# Patient Record
Sex: Female | Born: 1935 | Race: White | Hispanic: No | Smoking: Never smoker
Health system: Southern US, Community
[De-identification: ages and names within clinical notes are randomized; demographics above are authoritative.]

## PROBLEM LIST (undated history)

## (undated) DIAGNOSIS — F419 Anxiety disorder, unspecified: Secondary | ICD-10-CM

## (undated) DIAGNOSIS — R7989 Other specified abnormal findings of blood chemistry: Secondary | ICD-10-CM

## (undated) DIAGNOSIS — G5710 Meralgia paresthetica, unspecified lower limb: Secondary | ICD-10-CM

## (undated) DIAGNOSIS — I10 Essential (primary) hypertension: Secondary | ICD-10-CM

## (undated) DIAGNOSIS — M8448XA Pathological fracture, other site, initial encounter for fracture: Secondary | ICD-10-CM

## (undated) DIAGNOSIS — M542 Cervicalgia: Secondary | ICD-10-CM

## (undated) DIAGNOSIS — I2510 Atherosclerotic heart disease of native coronary artery without angina pectoris: Secondary | ICD-10-CM

## (undated) DIAGNOSIS — R252 Cramp and spasm: Secondary | ICD-10-CM

## (undated) DIAGNOSIS — I4891 Unspecified atrial fibrillation: Secondary | ICD-10-CM

## (undated) DIAGNOSIS — Z7901 Long term (current) use of anticoagulants: Secondary | ICD-10-CM

## (undated) DIAGNOSIS — M79609 Pain in unspecified limb: Secondary | ICD-10-CM

## (undated) DIAGNOSIS — I498 Other specified cardiac arrhythmias: Secondary | ICD-10-CM

## (undated) DIAGNOSIS — L570 Actinic keratosis: Secondary | ICD-10-CM

## (undated) DIAGNOSIS — M5137 Other intervertebral disc degeneration, lumbosacral region: Secondary | ICD-10-CM

## (undated) DIAGNOSIS — M6740 Ganglion, unspecified site: Secondary | ICD-10-CM

## (undated) DIAGNOSIS — M25529 Pain in unspecified elbow: Secondary | ICD-10-CM

## (undated) DIAGNOSIS — M810 Age-related osteoporosis without current pathological fracture: Secondary | ICD-10-CM

## (undated) DIAGNOSIS — K644 Residual hemorrhoidal skin tags: Secondary | ICD-10-CM

## (undated) DIAGNOSIS — H119 Unspecified disorder of conjunctiva: Secondary | ICD-10-CM

## (undated) DIAGNOSIS — M545 Low back pain: Secondary | ICD-10-CM

## (undated) DIAGNOSIS — M25579 Pain in unspecified ankle and joints of unspecified foot: Secondary | ICD-10-CM

## (undated) DIAGNOSIS — M629 Disorder of muscle, unspecified: Secondary | ICD-10-CM

## (undated) DIAGNOSIS — S32009A Unspecified fracture of unspecified lumbar vertebra, initial encounter for closed fracture: Secondary | ICD-10-CM

## (undated) DIAGNOSIS — G510 Bell's palsy: Secondary | ICD-10-CM

## (undated) DIAGNOSIS — R21 Rash and other nonspecific skin eruption: Secondary | ICD-10-CM

## (undated) DIAGNOSIS — I493 Ventricular premature depolarization: Secondary | ICD-10-CM

## (undated) DIAGNOSIS — M546 Pain in thoracic spine: Secondary | ICD-10-CM

## (undated) DIAGNOSIS — IMO0001 Reserved for inherently not codable concepts without codable children: Secondary | ICD-10-CM

## (undated) DIAGNOSIS — I8393 Asymptomatic varicose veins of bilateral lower extremities: Secondary | ICD-10-CM

## (undated) DIAGNOSIS — R209 Unspecified disturbances of skin sensation: Secondary | ICD-10-CM

## (undated) DIAGNOSIS — I639 Cerebral infarction, unspecified: Secondary | ICD-10-CM

## (undated) DIAGNOSIS — C4491 Basal cell carcinoma of skin, unspecified: Secondary | ICD-10-CM

## (undated) DIAGNOSIS — I252 Old myocardial infarction: Secondary | ICD-10-CM

## (undated) DIAGNOSIS — R42 Dizziness and giddiness: Secondary | ICD-10-CM

## (undated) DIAGNOSIS — M25519 Pain in unspecified shoulder: Secondary | ICD-10-CM

## (undated) HISTORY — DX: Unspecified fracture of unspecified lumbar vertebra, initial encounter for closed fracture: S32.009A

## (undated) HISTORY — DX: Cramp and spasm: R25.2

## (undated) HISTORY — DX: Disorder of muscle, unspecified: M62.9

## (undated) HISTORY — DX: Unspecified disorder of conjunctiva: H11.9

## (undated) HISTORY — DX: Atherosclerotic heart disease of native coronary artery without angina pectoris: I25.10

## (undated) HISTORY — DX: Unspecified disturbances of skin sensation: R20.9

## (undated) HISTORY — DX: Dizziness and giddiness: R42

## (undated) HISTORY — DX: Pain in unspecified shoulder: M25.519

## (undated) HISTORY — DX: Essential (primary) hypertension: I10

## (undated) HISTORY — DX: Unspecified atrial fibrillation: I48.91

## (undated) HISTORY — DX: Other specified abnormal findings of blood chemistry: R79.89

## (undated) HISTORY — DX: Pathological fracture, other site, initial encounter for fracture: M84.48XA

## (undated) HISTORY — DX: Pain in unspecified elbow: M25.529

## (undated) HISTORY — DX: Asymptomatic varicose veins of bilateral lower extremities: I83.93

## (undated) HISTORY — DX: Pain in thoracic spine: M54.6

## (undated) HISTORY — DX: Low back pain: M54.5

## (undated) HISTORY — DX: Cerebral infarction, unspecified: I63.9

## (undated) HISTORY — DX: Pain in unspecified ankle and joints of unspecified foot: M25.579

## (undated) HISTORY — DX: Basal cell carcinoma of skin, unspecified: C44.91

## (undated) HISTORY — DX: Cervicalgia: M54.2

## (undated) HISTORY — DX: Other specified cardiac arrhythmias: I49.8

## (undated) HISTORY — DX: Pain in unspecified limb: M79.609

## (undated) HISTORY — DX: Residual hemorrhoidal skin tags: K64.4

## (undated) HISTORY — DX: Age-related osteoporosis without current pathological fracture: M81.0

## (undated) HISTORY — DX: Old myocardial infarction: I25.2

## (undated) HISTORY — DX: Rash and other nonspecific skin eruption: R21

## (undated) HISTORY — DX: Other intervertebral disc degeneration, lumbosacral region: M51.37

## (undated) HISTORY — DX: Actinic keratosis: L57.0

## (undated) HISTORY — DX: Ganglion, unspecified site: M67.40

## (undated) HISTORY — DX: Reserved for inherently not codable concepts without codable children: IMO0001

## (undated) HISTORY — DX: Meralgia paresthetica, unspecified lower limb: G57.10

## (undated) HISTORY — DX: Long term (current) use of anticoagulants: Z79.01

## (undated) HISTORY — DX: Bell's palsy: G51.0

## (undated) HISTORY — DX: Ventricular premature depolarization: I49.3

---
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
## 1979-07-18 DIAGNOSIS — G510 Bell's palsy: Secondary | ICD-10-CM

## 1979-07-18 HISTORY — DX: Bell's palsy: G51.0

## 1993-07-17 DIAGNOSIS — M810 Age-related osteoporosis without current pathological fracture: Secondary | ICD-10-CM

## 1993-07-17 HISTORY — DX: Age-related osteoporosis without current pathological fracture: M81.0

## 1996-11-06 HISTORY — PX: CORONARY ARTERY BYPASS GRAFT: SHX141

## 1997-02-05 ENCOUNTER — Encounter: Payer: Self-pay | Admitting: Internal Medicine

## 1997-07-17 DIAGNOSIS — I10 Essential (primary) hypertension: Secondary | ICD-10-CM

## 1997-07-17 DIAGNOSIS — I2510 Atherosclerotic heart disease of native coronary artery without angina pectoris: Secondary | ICD-10-CM

## 1997-07-17 HISTORY — DX: Atherosclerotic heart disease of native coronary artery without angina pectoris: I25.10

## 1997-07-17 HISTORY — DX: Essential (primary) hypertension: I10

## 2005-07-17 DIAGNOSIS — S32009A Unspecified fracture of unspecified lumbar vertebra, initial encounter for closed fracture: Secondary | ICD-10-CM

## 2005-07-17 HISTORY — DX: Unspecified fracture of unspecified lumbar vertebra, initial encounter for closed fracture: S32.009A

## 2006-11-21 ENCOUNTER — Encounter: Payer: Self-pay | Admitting: Internal Medicine

## 2007-07-08 DIAGNOSIS — G5710 Meralgia paresthetica, unspecified lower limb: Secondary | ICD-10-CM

## 2007-07-08 HISTORY — DX: Meralgia paresthetica, unspecified lower limb: G57.10

## 2009-02-15 ENCOUNTER — Encounter: Payer: Self-pay | Admitting: Internal Medicine

## 2009-05-13 DIAGNOSIS — M810 Age-related osteoporosis without current pathological fracture: Secondary | ICD-10-CM | POA: Insufficient documentation

## 2009-07-07 DIAGNOSIS — M545 Low back pain, unspecified: Secondary | ICD-10-CM

## 2009-07-07 HISTORY — DX: Low back pain, unspecified: M54.50

## 2009-07-14 ENCOUNTER — Encounter: Payer: Self-pay | Admitting: Internal Medicine

## 2009-08-06 DIAGNOSIS — R209 Unspecified disturbances of skin sensation: Secondary | ICD-10-CM

## 2009-08-06 HISTORY — DX: Unspecified disturbances of skin sensation: R20.9

## 2009-08-16 DIAGNOSIS — R252 Cramp and spasm: Secondary | ICD-10-CM

## 2009-08-16 DIAGNOSIS — M6740 Ganglion, unspecified site: Secondary | ICD-10-CM

## 2009-08-16 DIAGNOSIS — I8393 Asymptomatic varicose veins of bilateral lower extremities: Secondary | ICD-10-CM

## 2009-08-16 HISTORY — DX: Ganglion, unspecified site: M67.40

## 2009-08-16 HISTORY — DX: Asymptomatic varicose veins of bilateral lower extremities: I83.93

## 2009-08-16 HISTORY — DX: Cramp and spasm: R25.2

## 2009-12-20 ENCOUNTER — Encounter: Payer: Self-pay | Admitting: Internal Medicine

## 2010-03-16 ENCOUNTER — Ambulatory Visit: Payer: Self-pay | Admitting: Internal Medicine

## 2010-03-16 DIAGNOSIS — I4949 Other premature depolarization: Secondary | ICD-10-CM

## 2010-03-16 DIAGNOSIS — I252 Old myocardial infarction: Secondary | ICD-10-CM

## 2010-03-17 DIAGNOSIS — E785 Hyperlipidemia, unspecified: Secondary | ICD-10-CM

## 2010-06-27 DIAGNOSIS — I498 Other specified cardiac arrhythmias: Secondary | ICD-10-CM

## 2010-06-27 DIAGNOSIS — K644 Residual hemorrhoidal skin tags: Secondary | ICD-10-CM

## 2010-06-27 HISTORY — DX: Other specified cardiac arrhythmias: I49.8

## 2010-06-27 HISTORY — DX: Residual hemorrhoidal skin tags: K64.4

## 2010-09-06 DIAGNOSIS — Z7901 Long term (current) use of anticoagulants: Secondary | ICD-10-CM

## 2010-09-06 HISTORY — DX: Long term (current) use of anticoagulants: Z79.01

## 2010-09-07 ENCOUNTER — Ambulatory Visit: Payer: Self-pay | Admitting: Internal Medicine

## 2010-09-07 DIAGNOSIS — I4891 Unspecified atrial fibrillation: Secondary | ICD-10-CM | POA: Insufficient documentation

## 2010-09-12 ENCOUNTER — Ambulatory Visit: Payer: Self-pay | Admitting: Internal Medicine

## 2010-09-12 ENCOUNTER — Ambulatory Visit: Payer: Self-pay | Admitting: Cardiovascular Disease

## 2010-09-12 DIAGNOSIS — I2510 Atherosclerotic heart disease of native coronary artery without angina pectoris: Secondary | ICD-10-CM

## 2010-09-12 LAB — CONVERTED CEMR LAB: POC INR: 1.7

## 2010-09-19 LAB — CONVERTED CEMR LAB
HDL: 60.5 mg/dL (ref >39.00)
Triglycerides: 40 mg/dL (ref 0.0–149.0)
VLDL: 8 mg/dL (ref 0.0–40.0)

## 2010-09-21 ENCOUNTER — Telehealth (INDEPENDENT_AMBULATORY_CARE_PROVIDER_SITE_OTHER): Payer: Self-pay | Admitting: *Deleted

## 2010-09-21 DIAGNOSIS — I4891 Unspecified atrial fibrillation: Secondary | ICD-10-CM

## 2010-09-21 HISTORY — DX: Unspecified atrial fibrillation: I48.91

## 2010-09-22 ENCOUNTER — Encounter: Payer: Self-pay | Admitting: Cardiovascular Disease

## 2010-12-06 NOTE — Letter (Signed)
Summary: Immunologist Care Office Note  [facility] Office Note   Imported By: Roderic Ovens 03/24/2010 16:02:53  _____________________________________________________________________  External Attachment:    Type:   Image     Comment:   External Document

## 2010-12-06 NOTE — Medication Information (Signed)
Summary: COUMADIN  STARTED 3MG  DAILY 09-07-10/SL  Anticoagulant Therapy  Managed by: Weston Brass, PharmD Referring MD: Ladona Ridgel PCP: Eulah Citizen Supervising MD: Nahser Indication 1: Atrial Fibrillation Lab Used: [facility] [place] Site: [address] INR POC 1.7 INR RANGE 2.0-3.0  Dietary changes: no    Health status changes: no    Bleeding/hemorrhagic complications: no    Recent/future hospitalizations: no    Any changes in medication regimen? yes       Details: started Coumadin and digoxin last Wednesday after new diagnosis of Afib  Recent/future dental: no  Any missed doses?: no       Is patient compliant with meds? yes      Comments: Pt educated on bleeding risks, dietary concerns, and medication interactions.  Her husband has been on Coumadin in the past so both are very familiar with the medication.  Pt lives at [facility]. Will ask their clinic if they will monitor INR and adjust Coumadin.   Allergies: 1)  ! Relafen 2)  ! Bactrim  Anticoagulation Management History:      The patient comes in today for her initial visit for anticoagulation therapy.  Positive risk factors for bleeding include an age of 6 years or older.  The bleeding index is 'intermediate risk'.  Positive CHADS2 values include Age > 75 years old.  Anticoagulation responsible Paymon [employer]steel: Nahser.  INR POC: 1.7.  Cuvette Lot#: [id number].  Exp: 09/2011.    Anticoagulation Management Assessment/Plan:      The patient's current anticoagulation dose is Coumadin 3 mg tabs: take as directed.  The target INR is 2.0-3.0.  The next INR is due 09/19/2010.  Anticoagulation instructions were given to patient/husband.  Results were reviewed/authorized by Weston Brass, PharmD.  She was notified by Weston Brass PharmD.         Current Anticoagulation Instructions: INR 1.7  Continue same dose of 1 tablet every day.  Recheck INR in 1 week.

## 2010-12-06 NOTE — Assessment & Plan Note (Signed)
Summary: 6 MO F/U   Visit Type:  Follow-up Primary Provider:  Eulah Citizen   History of Present Illness: [name] returns today for additional followup.  She has a h/o CAD and an anteroseptal MI (silent) in the past.  She is s/p CABG.  Despite this she has preserved LV function and class 1 symptoms.  She exercises with her husband almost every day and denies c/p or sob.  She has rare palpitations with documented PVC's in the past.  No syncope or peripheral edema. She notes no change in her exercise tolerance.  Current Medications (verified): 1)  Atenolol 25 Mg Tabs (Atenolol) .... Take 1/2 Tablet By Mouth Daily 2)  Vytorin 10-40 Mg Tabs (Ezetimibe-Simvastatin) .... Take One Tablet By Mouth Dailyat Bedtime 3)  Aspirin 81 Mg Tbec (Aspirin) .... Take One Tablet By Mouth Daily 4)  Nitrostat 0.4 Mg Subl (Nitroglycerin) .Marland Kitchen.. 1 Tablet Under Tongue At Onset of Chest Pain; You May Repeat Every 5 Minutes For Up To 3 Doses. 5)  Calcium  Chew (Calcium Carbonate-Vit D-Min) .... Once Daily 6)  Lyrica 25 Mg Caps (Pregabalin) .... As Needed 7)  Ativan 1 Mg Tabs (Lorazepam) .... As Needed 8)  Hydrocodone-Acetaminophen 7.5-750 Mg Tabs (Hydrocodone-Acetaminophen) .... As Needed  Allergies: 1)  ! Relafen 2)  ! Bactrim  Past History:  Past Medical History: Last updated: 03/16/2010 Current Problems:  PREMATURE VENTRICULAR CONTRACTIONS (ICD-427.69) OLD MYOCARDIAL INFARCTION (ICD-412)  Past Surgical History: Last updated: 03/16/2010 bypass graft times two LIMA to LAD SVG to diagonal off bypass  Review of Systems  The patient denies chest pain, syncope, dyspnea on exertion, and peripheral edema.    Vital Signs:  Patient profile:   77 year old female Height:      66 inches Weight:      147 pounds BMI:     23.81 Pulse rate:   97 / minute BP sitting:   134 / 90  (left arm)  Vitals Entered By: Laurance Flatten CMA (September 07, 2010 2:27 PM)  Physical Exam  General:  Elderlt, well developed,  well nourished, in no acute distress.  HEENT: normal Neck: supple. No JVD. Carotids 2+ bilaterally no bruits Cor: IRIR tachycardia with no rubs, gallops or murmur Lungs: CTA with no wheezes or rhonchi Ab: soft, nontender. nondistended. No HSM. Good bowel sounds Ext: warm. no cyanosis, clubbing or edema Neuro: alert and oriented. Grossly nonfocal. affect pleasant    EKG  Procedure date:  09/07/2010  Findings:      Atrial fibrillation with an uncontrolled ventricular response rate of: 97.  Impression & Recommendations:  Problem # 1:  ATRIAL FIBRILLATION (ICD-427.31) this is a new problem.  I have discussed the treatment options with the patient and recommend we start coumadin and additional digoxin to help with rate control. Her updated medication list for this problem includes:    Atenolol 25 Mg Tabs (Atenolol) .Marland Kitchen... Take 1/2 tablet by mouth daily    Aspirin 81 Mg Tbec (Aspirin) .Marland Kitchen... Take one tablet by mouth daily    Coumadin 3 Mg Tabs (Warfarin sodium) .Marland Kitchen... Take as directed    Digoxin 0.125 Mg Tabs (Digoxin) ..... One by mouth daily  Orders: [address] Referral (Coumadin clinic)  Problem # 2:  OLD MYOCARDIAL INFARCTION (ICD-412) She denies any anginal symptoms.  Continue meds as below. Her updated medication list for this problem includes:    Atenolol 25 Mg Tabs (Atenolol) .Marland Kitchen... Take 1/2 tablet by mouth daily    Aspirin  81 Mg Tbec (Aspirin) .Marland Kitchen... Take one tablet by mouth daily    Nitrostat 0.4 Mg Subl (Nitroglycerin) .Marland Kitchen... 1 tablet under tongue at onset of chest pain; you may repeat every 5 minutes for up to 3 doses.    Coumadin 3 Mg Tabs (Warfarin sodium) .Marland Kitchen... Take as directed  Problem # 3:  DYSLIPIDEMIA (ICD-272.4) We discussed the possibility of switching to simvastatin but because she has been well controlled, I think it is reasonable to continue her current meds. Her updated medication list for this problem includes:    Vytorin 10-40 Mg Tabs  (Ezetimibe-simvastatin) .Marland Kitchen... Take one tablet by mouth dailyat bedtime  Patient Instructions: 1)  Your physician recommends that you schedule a follow-up appointment in: 3 months with Dr Ladona Ridgel 2)  Your physician has recommended you make the following change in your medication: start Coumadin 3mg  daily and Digoxin 0.125mg  daily 3)  Your physician recommends that you return for lab work on Mon fastin lipid panel and TSH  427.31  414.01 4)  You have been referred to [facility]  Prescriptions: DIGOXIN 0.125 MG TABS (DIGOXIN) one by mouth daily  #30 x 6   Entered by:   Dennis Bast, RN, BSN   Authorized by:   Laren Boom, MD, [facility]   Signed by:   Dennis Bast, RN, BSN on 09/07/2010   Method used:   Electronically to        [facility]  [phone]* (retail)       [address] [place], [place]  [address]       Ph: [phone] or [phone]       Fax: [phone]   RxID:   [id number] COUMADIN 3 MG TABS (WARFARIN SODIUM) take as directed  #45 x 3   Entered by:   Dennis Bast, RN, BSN   Authorized by:   Laren Boom, MD, [facility]   Signed by:   Dennis Bast, RN, BSN on 09/07/2010   Method used:   Electronically to        [facility]  [phone]* (retail)       [address] [place], [place]  [address]       Ph: [phone] or [phone]       Fax: [phone]   RxID:   [id number]

## 2010-12-06 NOTE — Medication Information (Signed)
Summary: Coumadin Clinic  Anticoagulant Therapy  Managed by: Inactive Referring MD: Ladona Ridgel PCP: Eulah Citizen Supervising MD: Clifton James MD, Cristal Deer Indication 1: Atrial Fibrillation Lab Used: [facility] [place] Site: [address] INR RANGE 2.0-3.0          Comments: Per nurse at [facility] pt will now have her INR dosed by Nils Flack, NP  Allergies: 1)  ! Relafen 2)  ! Bactrim  Anticoagulation Management History:      Positive risk factors for bleeding include an age of 30 years or older.  The bleeding index is 'intermediate risk'.  Positive CHADS2 values include Age > 92 years old.  Anticoagulation responsible provider: Clifton James MD, Cristal Deer.  Exp: 09/2011.    Anticoagulation Management Assessment/Plan:      The patient's current anticoagulation dose is Coumadin 3 mg tabs: take as directed.  The target INR is 2.0-3.0.  The next INR is due 09/19/2010.  Anticoagulation instructions were given to patient/husband.  Results were reviewed/authorized by Inactive.         Prior Anticoagulation Instructions: INR 1.7  Continue same dose of 1 tablet every day.  Recheck INR in 1 week.

## 2010-12-06 NOTE — Letter (Signed)
Summary: [facility] Progress Note  [facility] Progress Note   Imported By: Roderic Ovens 03/24/2010 16:01:26  _____________________________________________________________________  External Attachment:    Type:   Image     Comment:   External Document

## 2010-12-06 NOTE — Progress Notes (Signed)
Summary: Records Request  Faxed OV & EKG to [facility] ([phone]). Debby Freiberg  September 21, 2010 6:05 PM

## 2010-12-06 NOTE — Assessment & Plan Note (Signed)
Summary: NEW PT EVAL PT HAS HEART DISEASE OK PER Larrissa Stivers   Visit Type:  Initial Consult Primary Provider:  Eulah Conway   History of Present Illness: Susan Conway is referred today by Dr. Chilton Si to establish ongoing cardiology followup.  She and her husband (also a patient of mine) have moved to [place] to be closer to family.  She has a h/o CAD and an anteroseptal MI (silent) in the past.  She is s/p CABG.  Despite this she has preserved LV function and class 1 symptoms.  She exercises with her husband almost every day and denies c/p or sob.  She has rare palpitations with documented PVC's in the past.  No syncope or peripheral edema.  Current Medications (verified): 1)  Alendronate Sodium 70 Mg Tabs (Alendronate Sodium) .Marland Kitchen.. 1 Tab Weekly 2)  Atenolol 25 Mg Tabs (Atenolol) .... Take 1/2 Tablet By Mouth Daily 3)  Vytorin 10-40 Mg Tabs (Ezetimibe-Simvastatin) .... Take One Tablet By Mouth Dailyat Bedtime 4)  Aspirin 81 Mg Tbec (Aspirin) .... Take One Tablet By Mouth Daily 5)  Nitrostat 0.4 Mg Subl (Nitroglycerin) .Marland Kitchen.. 1 Tablet Under Tongue At Onset of Chest Pain; You May Repeat Every 5 Minutes For Up To 3 Doses. 6)  Calcium 1200-1000 Mg-Unit Chew (Calcium Carbonate-Vit D-Min) .... Two Times A Day 7)  Lyrica 25 Mg Caps (Pregabalin) .... As Needed 8)  Ativan 1 Mg Tabs (Lorazepam) .... As Needed 9)  Hydrocodone-Acetaminophen 7.5-750 Mg Tabs (Hydrocodone-Acetaminophen) .... As Needed  Allergies (verified): 1)  ! Relafen 2)  ! Bactrim  Past History:  Past Medical History: Last updated: 03/16/2010 Current Problems:  PREMATURE VENTRICULAR CONTRACTIONS (ICD-427.69) OLD MYOCARDIAL INFARCTION (ICD-412)  Past Surgical History: Last updated: 03/16/2010 bypass graft times two LIMA to LAD SVG to diagonal off bypass  Family History: Non-contributory   Social History: Married, retired and living at [facility]  Review of Systems       All systems reviewed and negative except as noted in  the HPI.  Vital Signs:  Patient profile:   77 year old female Height:      66 inches Weight:      142 pounds BMI:     23.00 Pulse rate:   57 / minute BP sitting:   132 / 82  (left arm)  Vitals Entered By: Laurance Flatten CMA (Mar 16, 2010 2:24 PM)  Physical Exam  General:  Elderlt, well developed, well nourished, in no acute distress.  HEENT: normal Neck: supple. No JVD. Carotids 2+ bilaterally no bruits Cor: Regular brady with no rubs, gallops or murmur Lungs: CTA with no wheezes or rhonchi Ab: soft, nontender. nondistended. No HSM. Good bowel sounds Ext: warm. no cyanosis, clubbing or edema Neuro: alert and oriented. Grossly nonfocal. affect pleasant    EKG  Procedure date:  03/16/2010  Findings:      Sinus bradycardia with rate of: 57.   Comments:      Evidence of remote septal MI.    Impression & Recommendations:  Problem # 1:  OLD MYOCARDIAL INFARCTION (ICD-412) She denies anginal symptoms and is currently asmptomatic.  She will continue her current meds. Her updated medication list for this problem includes:    Atenolol 25 Mg Tabs (Atenolol) .Marland Kitchen... Take 1/2 tablet by mouth daily    Aspirin 81 Mg Tbec (Aspirin) .Marland Kitchen... Take one tablet by mouth daily    Nitrostat 0.4 Mg Subl (Nitroglycerin) .Marland Kitchen... 1 tablet under tongue at onset of chest pain; you may repeat every 5 minutes for  up to 3 doses.  Problem # 2:  PREMATURE VENTRICULAR CONTRACTIONS (ICD-427.69) These are minimally symptomatic.  Will followup. Her updated medication list for this problem includes:    Atenolol 25 Mg Tabs (Atenolol) .Marland Kitchen... Take 1/2 tablet by mouth daily    Aspirin 81 Mg Tbec (Aspirin) .Marland Kitchen... Take one tablet by mouth daily    Nitrostat 0.4 Mg Subl (Nitroglycerin) .Marland Kitchen... 1 tablet under tongue at onset of chest pain; you may repeat every 5 minutes for up to 3 doses.  Problem # 3:  DYSLIPIDEMIA (ICD-272.4) Continue Vytorin and maintain a low fat diet. Her updated medication list for this problem  includes:    Vytorin 10-40 Mg Tabs (Ezetimibe-simvastatin) .Marland Kitchen... Take one tablet by mouth dailyat bedtime  Patient Instructions: 1)  Your physician recommends that you schedule a follow-up appointment in: 6 months with Dr Ladona Ridgel

## 2010-12-06 NOTE — Letter (Signed)
Summary: [facility]Do[facility]   [facility]Do[facility]   Imported By: Roderic Ovens 08/09/2010 12:04:10  _____________________________________________________________________  External Attachment:    Type:   Image     Comment:   External Document

## 2010-12-13 ENCOUNTER — Encounter: Payer: Self-pay | Admitting: Internal Medicine

## 2010-12-13 ENCOUNTER — Ambulatory Visit (INDEPENDENT_AMBULATORY_CARE_PROVIDER_SITE_OTHER): Payer: Medicare Other | Admitting: Internal Medicine

## 2010-12-13 DIAGNOSIS — I4891 Unspecified atrial fibrillation: Secondary | ICD-10-CM

## 2010-12-13 DIAGNOSIS — I2589 Other forms of chronic ischemic heart disease: Secondary | ICD-10-CM

## 2010-12-15 ENCOUNTER — Telehealth: Payer: Self-pay | Admitting: Internal Medicine

## 2010-12-22 NOTE — Progress Notes (Signed)
Summary: callig regarding medications  Phone Note Call from Patient Call back at Home Phone [phone]   Caller: Patient Summary of Call: Pt calling regarding medications Initial call taken by: Judie Grieve,  December 15, 2010 10:51 AM  Follow-up for Phone Call        I sent in for her Digoxin and not her Warfarin.  She will have to get the Warfarin filled from her primary who follows her INR's Dennis Bast, RN, BSN  December 15, 2010 2:54 PM

## 2010-12-22 NOTE — Assessment & Plan Note (Signed)
Summary: 3 MONTH ROV.SL=MJ   Visit Type:  Follow-up Primary Provider:  Eulah Citizen   History of Present Illness: Susan Conway returns today for additional followup.  She has a h/o CAD and an anteroseptal MI (silent) in the past.  She is s/p CABG.  Despite this she has preserved LV function and class 1 symptoms.  She exercises with her husband almost every day and denies c/p or sob.  She has rare palpitations with documented PVC's in the past.  No syncope or peripheral edema. She notes no change in her exercise tolerance. When I last saw her several months ago she had developed atrial fibrillation and was placed on coumadin and digoxin. She notes minimal palpitations since then.  Current Medications (verified): 1)  Atenolol 25 Mg Tabs (Atenolol) .... Take 1/2 Tablet By Mouth Daily 2)  Vytorin 10-40 Mg Tabs (Ezetimibe-Simvastatin) .... Take One Tablet By Mouth Dailyat Bedtime 3)  Aspirin 81 Mg Tbec (Aspirin) .... Take One Tablet By Mouth Daily 4)  Calcium  Chew (Calcium Carbonate-Vit D-Min) .... Once Daily 5)  Lyrica 25 Mg Caps (Pregabalin) .... As Needed 6)  Ativan 1 Mg Tabs (Lorazepam) .... As Needed 7)  Hydrocodone-Acetaminophen 7.5-750 Mg Tabs (Hydrocodone-Acetaminophen) .... As Needed 8)  Coumadin 3 Mg Tabs (Warfarin Sodium) .... Take As Directed 9)  Digoxin 0.125 Mg Tabs (Digoxin) .... One By Mouth Daily  Allergies: 1)  ! Relafen 2)  ! Bactrim  Past History:  Past Medical History: Last updated: 03/16/2010 Current Problems:  PREMATURE VENTRICULAR CONTRACTIONS (ICD-427.69) OLD MYOCARDIAL INFARCTION (ICD-412)  Past Surgical History: Last updated: 03/16/2010 bypass graft times two LIMA to LAD SVG to diagonal off bypass  Review of Systems  The patient denies chest pain, syncope, dyspnea on exertion, and peripheral edema.    Vital Signs:  Patient profile:   77 year old female Height:      66 inches Weight:      144 pounds Pulse rate:   51 / minute BP sitting:   118 / 60   (left arm)  Vitals Entered By: Laurance Flatten CMA (December 13, 2010 2:51 PM)  Physical Exam  General:  Elderlt, well developed, well nourished, in no acute distress.  HEENT: normal Neck: supple. No JVD. Carotids 2+ bilaterally no bruits Cor: IRIR tachycardia with no rubs, gallops or murmur Lungs: CTA with no wheezes or rhonchi Ab: soft, nontender. nondistended. No HSM. Good bowel sounds Ext: warm. no cyanosis, clubbing or edema Neuro: alert and oriented. Grossly nonfocal. affect pleasant    EKG  Procedure date:  12/13/2010  Findings:      Sinus bradycardia with rate of:  54.  Impression & Recommendations:  Problem # 1:  ATRIAL FIBRILLATION (ICD-427.31) Her symptoms appear to be well controlled on meds below. It is unclear how much she has been in atrial fib as she is minimally symptomatic. Today she is back in NSR. She will continue her meds as below. Her updated medication list for this problem includes:    Atenolol 25 Mg Tabs (Atenolol) .Marland Kitchen... Take 1/2 tablet by mouth daily    Aspirin 81 Mg Tbec (Aspirin) .Marland Kitchen... Take one tablet by mouth daily    Coumadin 3 Mg Tabs (Warfarin sodium) .Marland Kitchen... Take as directed    Digoxin 0.125 Mg Tabs (Digoxin) ..... One by mouth daily  Problem # 2:  CORONARY ATHEROSCLEROSIS NATIVE CORONARY ARTERY (ICD-414.01) She denies anginal symptoms. Continue meds as below. She remains very active. The following medications were removed from the medication  list:    Nitrostat 0.4 Mg Subl (Nitroglycerin) .Marland Kitchen... 1 tablet under tongue at onset of chest pain; you may repeat every 5 minutes for up to 3 doses. Her updated medication list for this problem includes:    Atenolol 25 Mg Tabs (Atenolol) .Marland Kitchen... Take 1/2 tablet by mouth daily    Aspirin 81 Mg Tbec (Aspirin) .Marland Kitchen... Take one tablet by mouth daily    Coumadin 3 Mg Tabs (Warfarin sodium) .Marland Kitchen... Take as directed  Patient Instructions: 1)  Your physician wants you to follow-up in: 6 months with Dr Court Joy will  receive a reminder letter in the mail two months in advance. If you don't receive a letter, please call our office to schedule the follow-up appointment. 2)  Your physician recommends that you continue on your current medications as directed. Please refer to the Current Medication list given to you today. Prescriptions: DIGOXIN 0.125 MG TABS (DIGOXIN) one by mouth daily  #90 x 3   Entered by:   Dennis Bast, RN, BSN   Authorized by:   Laren Boom, MD, [facility]   Signed by:   Dennis Bast, RN, BSN on 12/13/2010   Method used:   Faxed to ...       [facility])       [address] [address]       [place], [place]  [address]       Ph: [phone]       Fax: [phone]   RxID:   [id number]

## 2010-12-26 ENCOUNTER — Encounter: Payer: Self-pay | Admitting: Internal Medicine

## 2010-12-26 DIAGNOSIS — H119 Unspecified disorder of conjunctiva: Secondary | ICD-10-CM

## 2010-12-26 DIAGNOSIS — M25529 Pain in unspecified elbow: Secondary | ICD-10-CM

## 2010-12-26 HISTORY — DX: Unspecified disorder of conjunctiva: H11.9

## 2010-12-26 HISTORY — DX: Pain in unspecified elbow: M25.529

## 2011-01-11 DIAGNOSIS — M79609 Pain in unspecified limb: Secondary | ICD-10-CM

## 2011-01-11 HISTORY — DX: Pain in unspecified limb: M79.609

## 2011-01-17 NOTE — Letter (Signed)
Summary: [facility]   Imported By: Marylou Mccoy 01/13/2011 11:48:02  _____________________________________________________________________  External Attachment:    Type:   Image     Comment:   External Document

## 2011-02-16 ENCOUNTER — Other Ambulatory Visit: Payer: Self-pay | Admitting: Internal Medicine

## 2011-02-16 DIAGNOSIS — Z1231 Encounter for screening mammogram for malignant neoplasm of breast: Secondary | ICD-10-CM

## 2011-03-08 ENCOUNTER — Ambulatory Visit
Admission: RE | Admit: 2011-03-08 | Discharge: 2011-03-08 | Disposition: A | Discharge status: Home / Self Care | Payer: Medicare Other | Source: Ambulatory Visit | Attending: Internal Medicine | Admitting: Internal Medicine

## 2011-03-08 DIAGNOSIS — Z1231 Encounter for screening mammogram for malignant neoplasm of breast: Secondary | ICD-10-CM

## 2011-04-27 ENCOUNTER — Encounter: Payer: Self-pay | Admitting: Internal Medicine

## 2011-06-06 ENCOUNTER — Encounter: Payer: Self-pay | Admitting: Internal Medicine

## 2011-06-15 ENCOUNTER — Ambulatory Visit (INDEPENDENT_AMBULATORY_CARE_PROVIDER_SITE_OTHER): Payer: Medicare Other | Admitting: Internal Medicine

## 2011-06-15 ENCOUNTER — Encounter: Payer: Self-pay | Admitting: Internal Medicine

## 2011-06-15 VITALS — BP 122/84 | HR 82 | Ht 66.5 in | Wt 143.4 lb

## 2011-06-15 DIAGNOSIS — E785 Hyperlipidemia, unspecified: Secondary | ICD-10-CM

## 2011-06-15 DIAGNOSIS — I4891 Unspecified atrial fibrillation: Secondary | ICD-10-CM

## 2011-06-15 DIAGNOSIS — I2510 Atherosclerotic heart disease of native coronary artery without angina pectoris: Secondary | ICD-10-CM

## 2011-06-15 NOTE — Progress Notes (Signed)
HPI [name] today for followup. She is a very pleasant 77 year old woman with a history of atrial ablation, hypertension, chronic Coumadin therapy, and dyslipidemia. She also has coronary artery disease. She denies chest pain, shortness of breath, or syncope. She has occasional palpitations and rare dizziness but no headache, fever, or chills. Allergies  Allergen Reactions  . Bactrim   . Iodinated Diagnostic Agents   . Nabumetone   . Sulfamethoxazole W/Trimethoprim      Current Outpatient Prescriptions  Medication Sig Dispense Refill  . aspirin 81 MG tablet Take 81 mg by mouth daily.        Marland Kitchen atenolol (TENORMIN) 25 MG tablet Take 12.5 mg by mouth daily.        . Calcium-Vitamin D-Vitamin K (CALCIUM SOFT CHEWS PO) Take by mouth daily.        . digoxin (LANOXIN) 0.125 MG tablet Take 125 mcg by mouth daily.        Marland Kitchen ezetimibe-simvastatin (VYTORIN) 10-40 MG per tablet Take 1 tablet by mouth at bedtime.        Marland Kitchen HYDROcodone-acetaminophen (VICODIN ES) 7.5-750 MG per tablet as needed.        Marland Kitchen LORazepam (ATIVAN) 1 MG tablet Take 1 mg by mouth as needed.        . pregabalin (LYRICA) 25 MG capsule as needed.        . warfarin (COUMADIN) 3 MG tablet Take as directed          Past Medical History  Diagnosis Date  . PVC's (premature ventricular contractions)   . MI, old     ROS:   All systems reviewed and negative except as noted in the HPI.   Past Surgical History  Procedure Date  . Coronary artery bypass graft     x2; LIMA to LAD; SVG to diagonal off bypass     No family history on file.   History   Social History  . Marital Status: Married    Spouse Name: N/A    Number of Children: N/A  . Years of Education: N/A   Occupational History  . Retired    Social History Main Topics  . Smoking status: Never Smoker   . Smokeless tobacco: Never Used  . Alcohol Use: Yes     occasional  . Drug Use: No  . Sexually Active: Not on file   Other Topics Concern  . Not on file    Social History Narrative   Living at [employer].     BP 122/84  Pulse 82  Ht 5' 6.5" (1.689 m)  Wt 143 lb 6.4 oz (65.046 kg)  BMI 22.80 kg/m2  Physical Exam:  Well appearing elderly woman, NAD HEENT: Unremarkable Neck:  No JVD, no thyromegally Lymphatics:  No adenopathy Back:  No CVA tenderness Lungs:  Clear HEART:  Regular rate rhythm, no murmurs, no rubs, no clicks Abd:  soft, positive bowel sounds, no organomegally, no rebound, no guarding Ext:  2 plus pulses, no edema, no cyanosis, no clubbing Skin:  No rashes no nodules Neuro:  CN II through XII intact, motor grossly intact  EKG Atrial fibrillation with a controlled ventricular response. Rightward axis.  Anteroseptal MI  Assess/Plan:

## 2011-06-15 NOTE — Assessment & Plan Note (Signed)
She denies anginal symptoms. Continue her current medical therapy.

## 2011-06-15 NOTE — Assessment & Plan Note (Signed)
She appears to be asymptomatic and her ventricular rate appears to be well-controlled. I have discussed the warning signs indicating that her heart is going either too fast or too slow. She will call as if she has problems. She will continue her current medical therapy.

## 2011-06-15 NOTE — Patient Instructions (Signed)
Your physician wants you to follow-up in:  6 months. You will receive a reminder letter in the mail two months in advance. If you don't receive a letter, please call our office to schedule the follow-up appointment.   

## 2011-06-15 NOTE — Assessment & Plan Note (Signed)
I have instructed her to maintain a low-fat low-sodium diet. She will continue her statin therapy.

## 2011-06-28 ENCOUNTER — Other Ambulatory Visit: Payer: Self-pay | Admitting: Internal Medicine

## 2011-06-28 LAB — HM DEXA SCAN

## 2011-07-06 ENCOUNTER — Ambulatory Visit
Admission: RE | Admit: 2011-07-06 | Discharge: 2011-07-06 | Disposition: A | Discharge status: Home / Self Care | Payer: Medicare Other | Source: Ambulatory Visit | Attending: Internal Medicine | Admitting: Internal Medicine

## 2011-08-02 DIAGNOSIS — R21 Rash and other nonspecific skin eruption: Secondary | ICD-10-CM

## 2011-08-02 HISTORY — DX: Rash and other nonspecific skin eruption: R21

## 2011-11-07 DIAGNOSIS — R7989 Other specified abnormal findings of blood chemistry: Secondary | ICD-10-CM

## 2011-11-07 HISTORY — DX: Other specified abnormal findings of blood chemistry: R79.89

## 2011-11-08 DIAGNOSIS — R42 Dizziness and giddiness: Secondary | ICD-10-CM

## 2011-11-08 DIAGNOSIS — I4891 Unspecified atrial fibrillation: Secondary | ICD-10-CM | POA: Diagnosis not present

## 2011-11-08 DIAGNOSIS — Z7901 Long term (current) use of anticoagulants: Secondary | ICD-10-CM | POA: Diagnosis not present

## 2011-11-08 HISTORY — DX: Dizziness and giddiness: R42

## 2011-11-22 DIAGNOSIS — I4891 Unspecified atrial fibrillation: Secondary | ICD-10-CM | POA: Diagnosis not present

## 2011-11-22 DIAGNOSIS — Z7901 Long term (current) use of anticoagulants: Secondary | ICD-10-CM | POA: Diagnosis not present

## 2011-12-11 DIAGNOSIS — Z7901 Long term (current) use of anticoagulants: Secondary | ICD-10-CM | POA: Diagnosis not present

## 2011-12-11 DIAGNOSIS — M25519 Pain in unspecified shoulder: Secondary | ICD-10-CM | POA: Diagnosis not present

## 2011-12-11 DIAGNOSIS — M810 Age-related osteoporosis without current pathological fracture: Secondary | ICD-10-CM | POA: Diagnosis not present

## 2011-12-11 DIAGNOSIS — I10 Essential (primary) hypertension: Secondary | ICD-10-CM | POA: Diagnosis not present

## 2011-12-11 DIAGNOSIS — I4891 Unspecified atrial fibrillation: Secondary | ICD-10-CM | POA: Diagnosis not present

## 2011-12-18 ENCOUNTER — Ambulatory Visit: Payer: Medicare Other | Admitting: Internal Medicine

## 2011-12-20 DIAGNOSIS — C4441 Basal cell carcinoma of skin of scalp and neck: Secondary | ICD-10-CM | POA: Diagnosis not present

## 2012-01-03 DIAGNOSIS — I4891 Unspecified atrial fibrillation: Secondary | ICD-10-CM | POA: Diagnosis not present

## 2012-01-03 DIAGNOSIS — Z7901 Long term (current) use of anticoagulants: Secondary | ICD-10-CM | POA: Diagnosis not present

## 2012-01-04 ENCOUNTER — Ambulatory Visit (INDEPENDENT_AMBULATORY_CARE_PROVIDER_SITE_OTHER): Payer: Medicare Other | Admitting: Internal Medicine

## 2012-01-04 ENCOUNTER — Encounter: Payer: Self-pay | Admitting: Internal Medicine

## 2012-01-04 DIAGNOSIS — I4891 Unspecified atrial fibrillation: Secondary | ICD-10-CM | POA: Diagnosis not present

## 2012-01-04 DIAGNOSIS — I2510 Atherosclerotic heart disease of native coronary artery without angina pectoris: Secondary | ICD-10-CM | POA: Diagnosis not present

## 2012-01-04 NOTE — Patient Instructions (Signed)
Your physician wants you to follow-up in: 12 months with Dr Taylor You will receive a reminder letter in the mail two months in advance. If you don't receive a letter, please call our office to schedule the follow-up appointment.   Your physician has requested that you have an echocardiogram. Echocardiography is a painless test that uses sound waves to create images of your heart. It provides your doctor with information about the size and shape of your heart and how well your heart's chambers and valves are working. This procedure takes approximately one hour. There are no restrictions for this procedure.   

## 2012-01-04 NOTE — Assessment & Plan Note (Signed)
Her ventricular rate for the most part appears to be well controlled. She will continue her current medications for now.

## 2012-01-04 NOTE — Assessment & Plan Note (Signed)
The patient's symptoms are well controlled except she does have mild dyspnea. I've recommended that she undergo echocardiography to reevaluate her left ventricular dysfunction with her history of coronary disease and remote bypass and with her mild dyspnea. Additional recommendations will follow based on the results of her 2-D echo.

## 2012-01-04 NOTE — Progress Notes (Signed)
HPI Susan Conway returns today for followup. She is a very pleasant 78 year old woman with a history of atrial fibrillation, coronary artery disease status post bypass grafting, previously preserved LV function, and hypertension.  She notes occasional dizzy spells and dyspnea with exertion. This is minimally worse from previous visits. She has minimal peripheral edema. She denies syncope or chest pain. She denies fevers or chills.  Allergies  Allergen Reactions  . Bactrim   . Iodinated Diagnostic Agents   . Nabumetone   . Sulfamethoxazole W/Trimethoprim      Current Outpatient Prescriptions  Medication Sig Dispense Refill  . aspirin 81 MG tablet Take 81 mg by mouth daily.        Marland Kitchen atenolol (TENORMIN) 25 MG tablet Take 12.5 mg by mouth daily.        . Calcium-Vitamin D-Vitamin K (CALCIUM SOFT CHEWS PO) Take by mouth daily.        . digoxin (LANOXIN) 0.125 MG tablet Take 125 mcg by mouth daily.        Marland Kitchen ezetimibe-simvastatin (VYTORIN) 10-40 MG per tablet Take 1 tablet by mouth at bedtime.        Marland Kitchen HYDROcodone-acetaminophen (VICODIN ES) 7.5-750 MG per tablet as needed.        Marland Kitchen LORazepam (ATIVAN) 1 MG tablet Take 1 mg by mouth as needed.        . pregabalin (LYRICA) 25 MG capsule as needed.        . warfarin (COUMADIN) 3 MG tablet Take as directed          Past Medical History  Diagnosis Date  . PVC's (premature ventricular contractions)   . MI, old     ROS:   All systems reviewed and negative except as noted in the HPI.   Past Surgical History  Procedure Date  . Coronary artery bypass graft     x2; LIMA to LAD; SVG to diagonal off bypass     No family history on file.   History   Social History  . Marital Status: Married    Spouse Name: N/A    Number of Children: N/A  . Years of Education: N/A   Occupational History  . Retired    Social History Main Topics  . Smoking status: Never Smoker   . Smokeless tobacco: Never Used  . Alcohol Use: Yes     occasional  .  Drug Use: No  . Sexually Active: Not on file   Other Topics Concern  . Not on file   Social History Narrative   Living at [facility].     BP 138/88  Pulse 85  Wt 65.046 kg (143 lb 6.4 oz)  Physical Exam:  Well appearing elderly woman, NAD HEENT: Unremarkable Neck:  No JVD, no thyromegally Lymphatics:  No adenopathy Back:  No CVA tenderness Lungs:  Clear with no wheezes, rales, or rhonchi. HEART:  IRegular rate rhythm, no murmurs, no rubs, no clicks Abd:  soft, positive bowel sounds, no organomegally, no rebound, no guarding Ext:  2 plus pulses, no edema, no cyanosis, no clubbing Skin:  No rashes no nodules Neuro:  CN II through XII intact, motor grossly intact  EKG Atrial fibrillation with a controlled ventricular response. Nonspecific ST-T wave abnormality.   Assess/Plan:

## 2012-01-17 ENCOUNTER — Other Ambulatory Visit (HOSPITAL_COMMUNITY): Payer: Medicare Other

## 2012-01-17 DIAGNOSIS — IMO0001 Reserved for inherently not codable concepts without codable children: Secondary | ICD-10-CM | POA: Diagnosis not present

## 2012-01-17 HISTORY — DX: Reserved for inherently not codable concepts without codable children: IMO0001

## 2012-01-22 DIAGNOSIS — M810 Age-related osteoporosis without current pathological fracture: Secondary | ICD-10-CM | POA: Diagnosis not present

## 2012-01-22 DIAGNOSIS — M8448XA Pathological fracture, other site, initial encounter for fracture: Secondary | ICD-10-CM

## 2012-01-22 DIAGNOSIS — M545 Low back pain: Secondary | ICD-10-CM | POA: Diagnosis not present

## 2012-01-22 HISTORY — DX: Pathological fracture, other site, initial encounter for fracture: M84.48XA

## 2012-01-23 ENCOUNTER — Other Ambulatory Visit: Payer: Self-pay | Admitting: Internal Medicine

## 2012-01-23 ENCOUNTER — Ambulatory Visit
Admission: RE | Admit: 2012-01-23 | Discharge: 2012-01-23 | Disposition: A | Discharge status: Home / Self Care | Payer: Medicare Other | Source: Ambulatory Visit | Attending: Internal Medicine | Admitting: Internal Medicine

## 2012-01-23 DIAGNOSIS — M549 Dorsalgia, unspecified: Secondary | ICD-10-CM

## 2012-01-23 DIAGNOSIS — M4000 Postural kyphosis, site unspecified: Secondary | ICD-10-CM | POA: Diagnosis not present

## 2012-01-23 DIAGNOSIS — M545 Low back pain: Secondary | ICD-10-CM | POA: Diagnosis not present

## 2012-01-23 IMAGING — CR DG THORACIC SPINE 3V
3 series · 3 of 3 positions shown · non-contrast
Comparison: None.

CLINICAL DATA: Fell over one week ago with mid to low back pain

THORACIC SPINE - 2 VIEW + SWIMMERS

[view not recorded (1 of 3)]
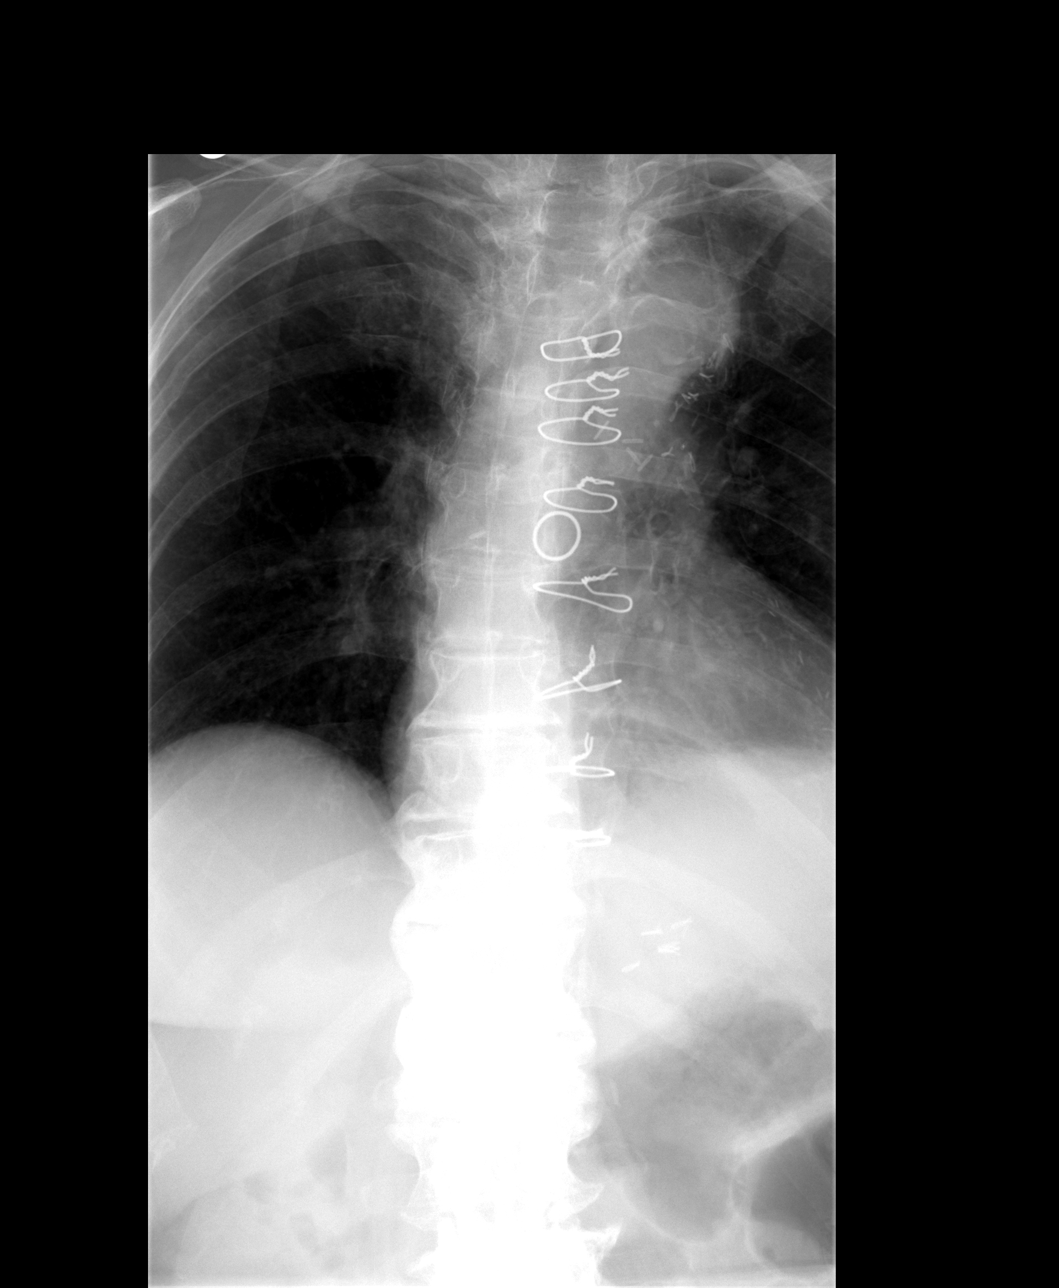

[view not recorded (2 of 3)]
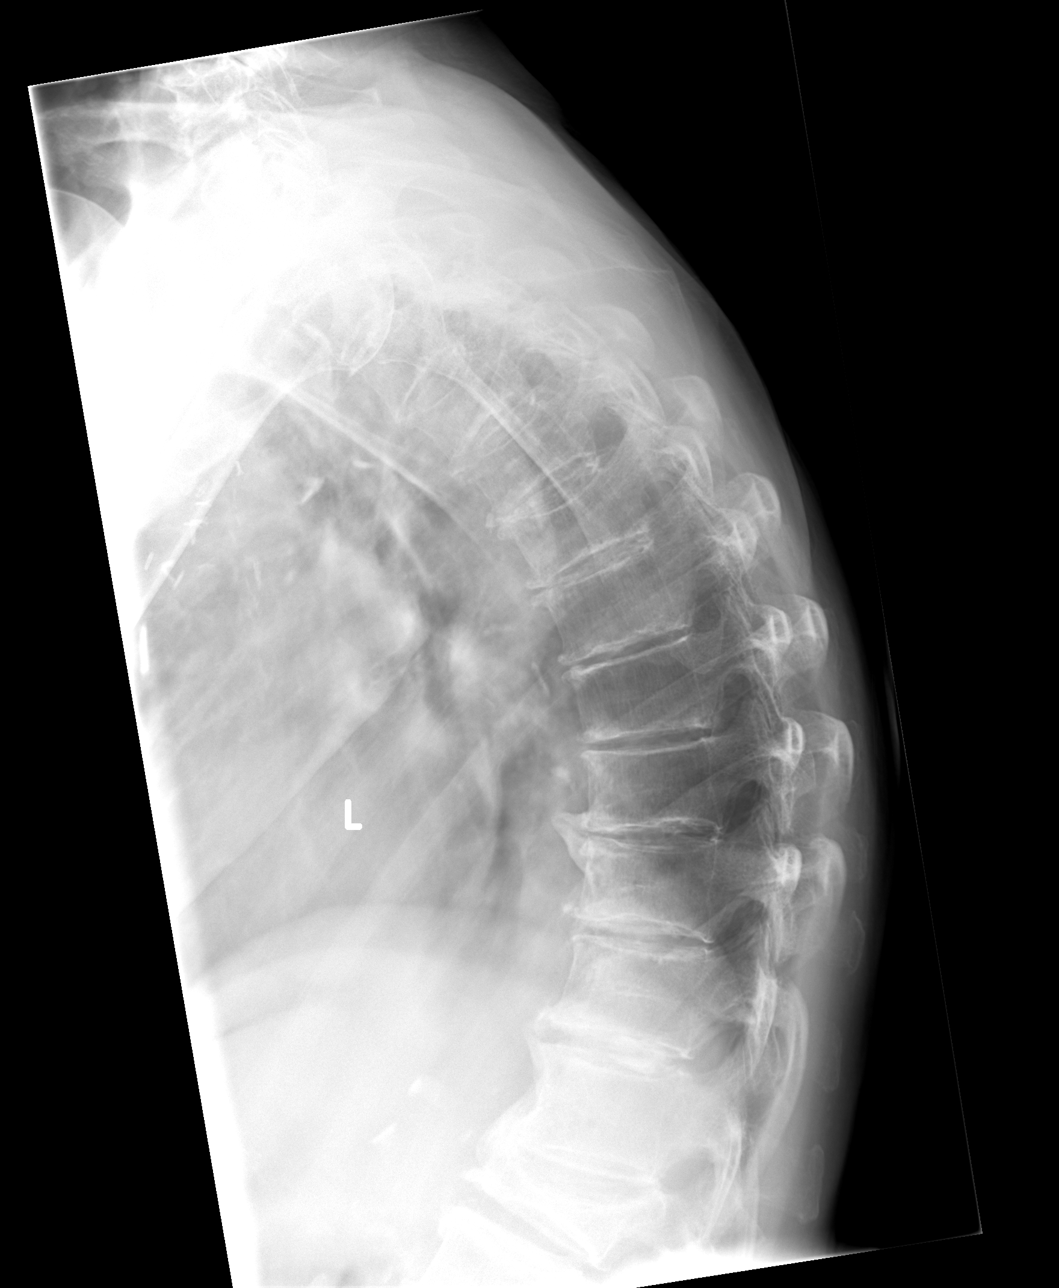

[view not recorded (3 of 3)]
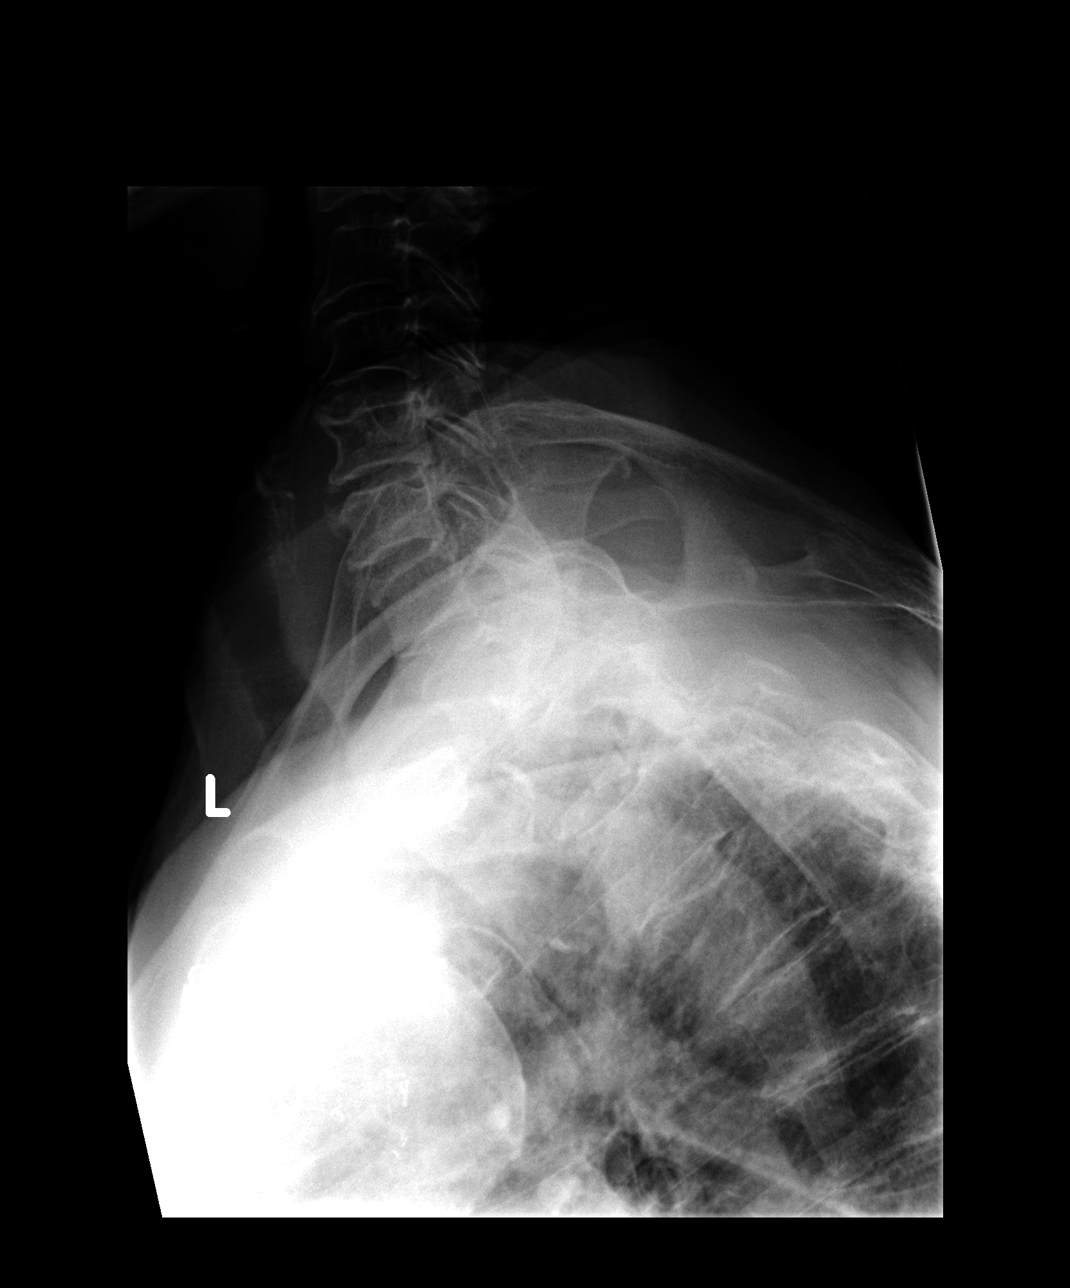

[3 of 3 positions shown; findings below may reference images not displayed]

FINDINGS: There is a thoracic kyphosis present in the bones are
osteopenic.  No definite thoracic compression deformity is seen.
There are degenerative changes noted in the mid to lower thoracic
spine.  There is a compression deformity however of L1 vertebral
body.  This may be old with bony spurring present.  Mild
retropulsion is noted at that level.
IMPRESSION: 1.  Thoracic kyphosis with osteopenia and degenerative change.
2.  Probable old compression deformity of the L1 vertebral body.
Correlate clinically.

## 2012-01-23 IMAGING — CR DG LUMBAR SPINE COMPLETE 4+V
5 series · 5 of 5 positions shown · non-contrast
Comparison: None.

CLINICAL DATA: Fell 1-1/2 weeks ago with back pain

LUMBAR SPINE - COMPLETE 4+ VIEW

[view not recorded (1 of 5)]
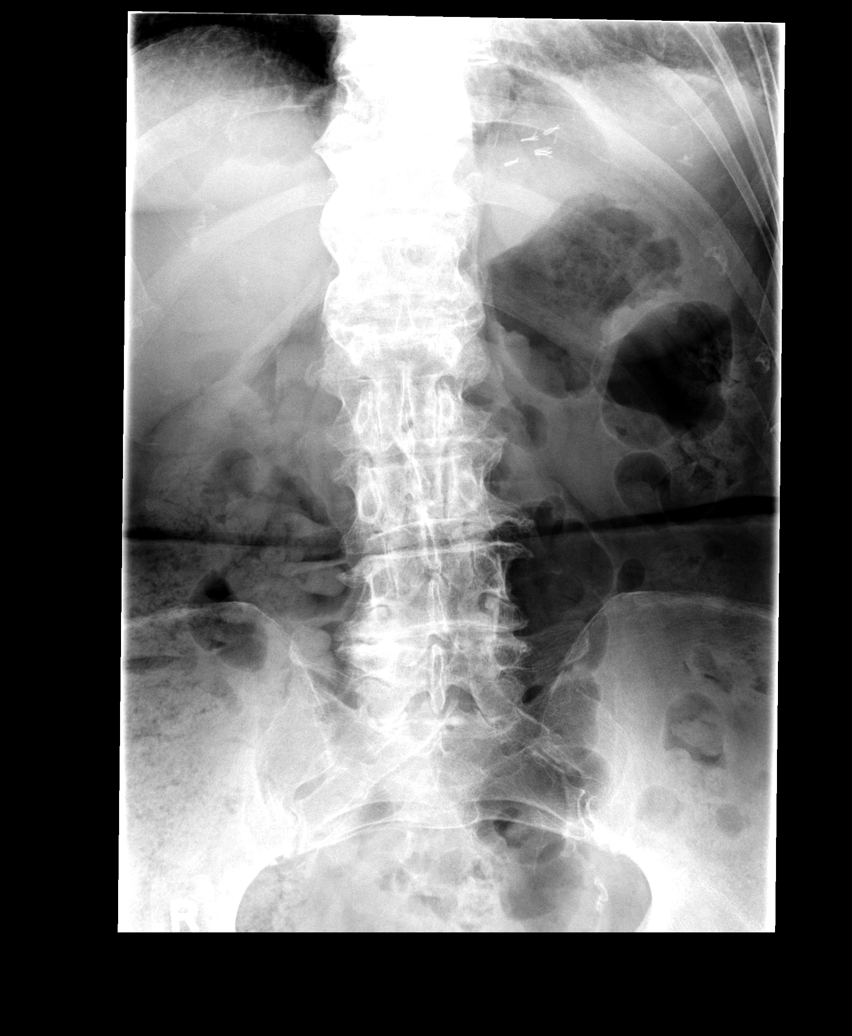

[view not recorded (2 of 5)]
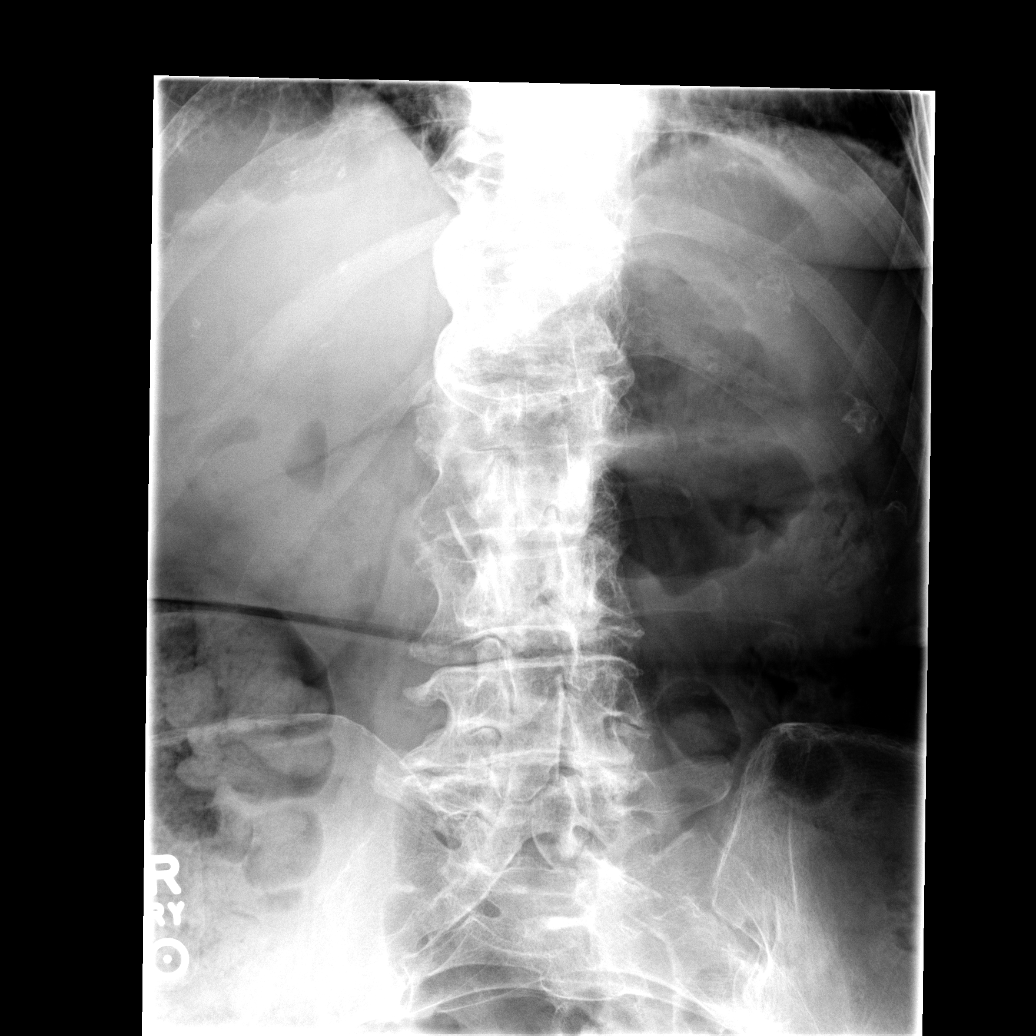

[view not recorded (3 of 5)]
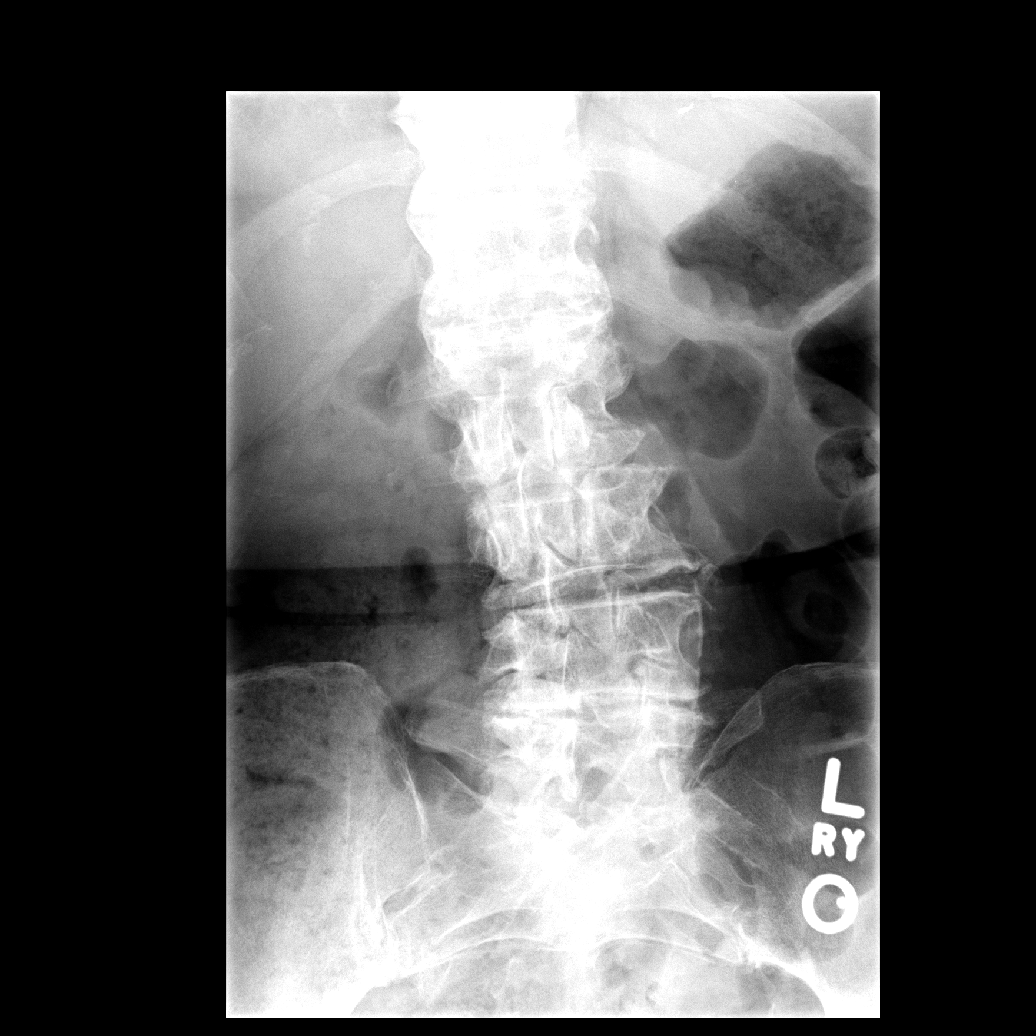

[view not recorded (4 of 5)]
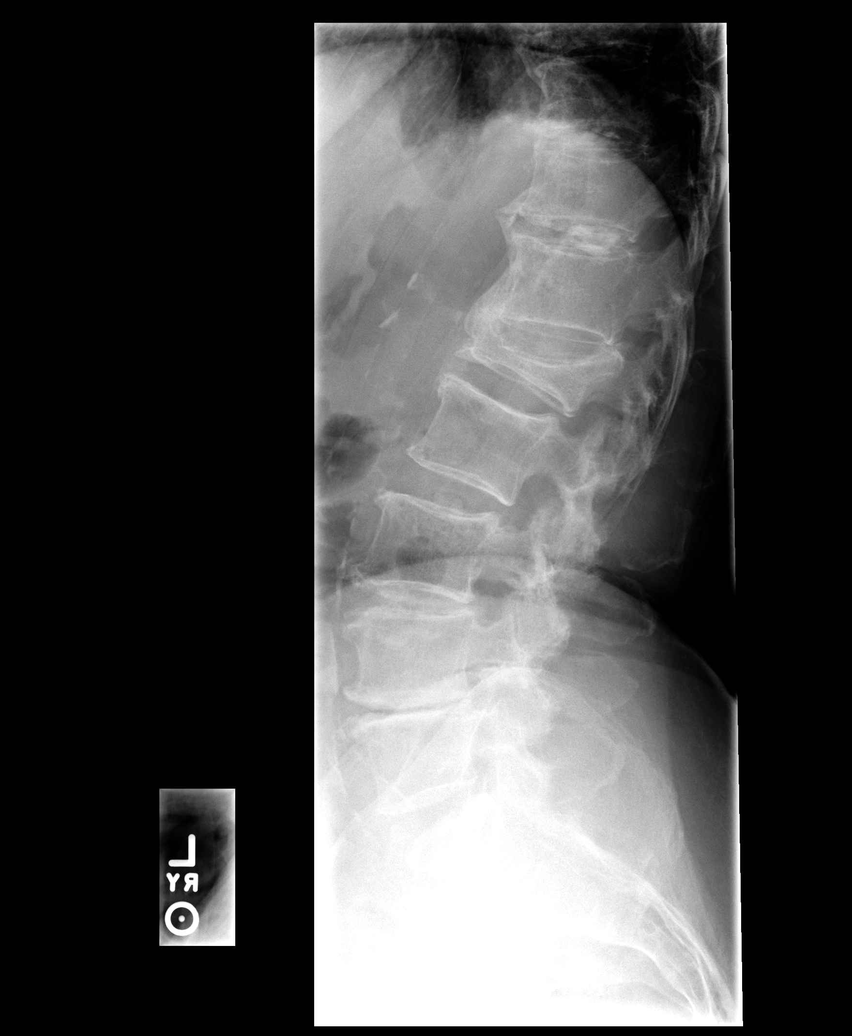

[view not recorded (5 of 5)]
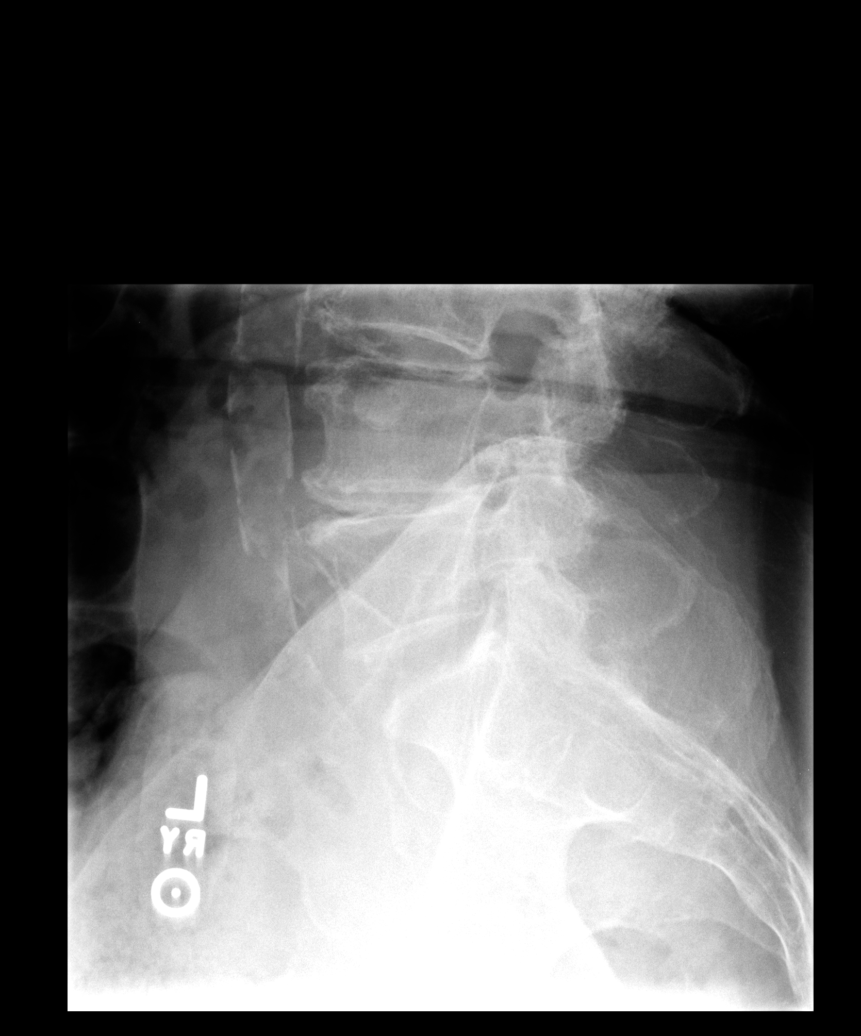

[5 of 5 positions shown; findings below may reference images not displayed]

FINDINGS: There is approximately 80% compression deformity of L1
vertebral body.  This appears old with bony spurring present.
Slight retropulsion is present.  There is degenerative disc disease
most marked at the L4-5 level with loss of disc space and sclerosis
with spurring.  No new compression deformity is seen.
IMPRESSION: Probable old compression deformity of L1.  Degenerative disc
disease at L4-5.

## 2012-01-26 ENCOUNTER — Ambulatory Visit (HOSPITAL_COMMUNITY): Payer: Medicare Other | Attending: Internal Medicine

## 2012-01-26 ENCOUNTER — Other Ambulatory Visit: Payer: Self-pay

## 2012-01-26 DIAGNOSIS — I4891 Unspecified atrial fibrillation: Secondary | ICD-10-CM | POA: Insufficient documentation

## 2012-01-26 DIAGNOSIS — I2510 Atherosclerotic heart disease of native coronary artery without angina pectoris: Secondary | ICD-10-CM | POA: Insufficient documentation

## 2012-01-26 DIAGNOSIS — Z951 Presence of aortocoronary bypass graft: Secondary | ICD-10-CM | POA: Diagnosis not present

## 2012-01-31 DIAGNOSIS — M542 Cervicalgia: Secondary | ICD-10-CM | POA: Diagnosis not present

## 2012-01-31 DIAGNOSIS — M546 Pain in thoracic spine: Secondary | ICD-10-CM | POA: Diagnosis not present

## 2012-01-31 DIAGNOSIS — M5137 Other intervertebral disc degeneration, lumbosacral region: Secondary | ICD-10-CM | POA: Diagnosis not present

## 2012-01-31 DIAGNOSIS — Z7901 Long term (current) use of anticoagulants: Secondary | ICD-10-CM | POA: Diagnosis not present

## 2012-01-31 DIAGNOSIS — I4891 Unspecified atrial fibrillation: Secondary | ICD-10-CM | POA: Diagnosis not present

## 2012-01-31 DIAGNOSIS — M51379 Other intervertebral disc degeneration, lumbosacral region without mention of lumbar back pain or lower extremity pain: Secondary | ICD-10-CM

## 2012-01-31 HISTORY — DX: Pain in thoracic spine: M54.6

## 2012-01-31 HISTORY — DX: Other intervertebral disc degeneration, lumbosacral region without mention of lumbar back pain or lower extremity pain: M51.379

## 2012-01-31 HISTORY — DX: Cervicalgia: M54.2

## 2012-01-31 HISTORY — DX: Other intervertebral disc degeneration, lumbosacral region: M51.37

## 2012-02-01 ENCOUNTER — Ambulatory Visit
Admission: RE | Admit: 2012-02-01 | Discharge: 2012-02-01 | Disposition: A | Discharge status: Home / Self Care | Payer: Medicare Other | Source: Ambulatory Visit | Attending: Internal Medicine | Admitting: Internal Medicine

## 2012-02-01 ENCOUNTER — Other Ambulatory Visit: Payer: Self-pay | Admitting: Internal Medicine

## 2012-02-01 DIAGNOSIS — M25519 Pain in unspecified shoulder: Secondary | ICD-10-CM | POA: Diagnosis not present

## 2012-02-01 DIAGNOSIS — M542 Cervicalgia: Secondary | ICD-10-CM

## 2012-02-01 DIAGNOSIS — M5090 Cervical disc disorder, unspecified, unspecified cervical region: Secondary | ICD-10-CM | POA: Diagnosis not present

## 2012-02-01 IMAGING — CR DG CERVICAL SPINE COMPLETE 4+V
6 series · 6 of 6 positions shown · non-contrast
Comparison: Thoracic spine plain films of [DATE].

CLINICAL DATA: Fall [DATE].  Pain at right neck and shoulder.

CERVICAL SPINE - COMPLETE 4+ VIEW

[view not recorded (1 of 6)]
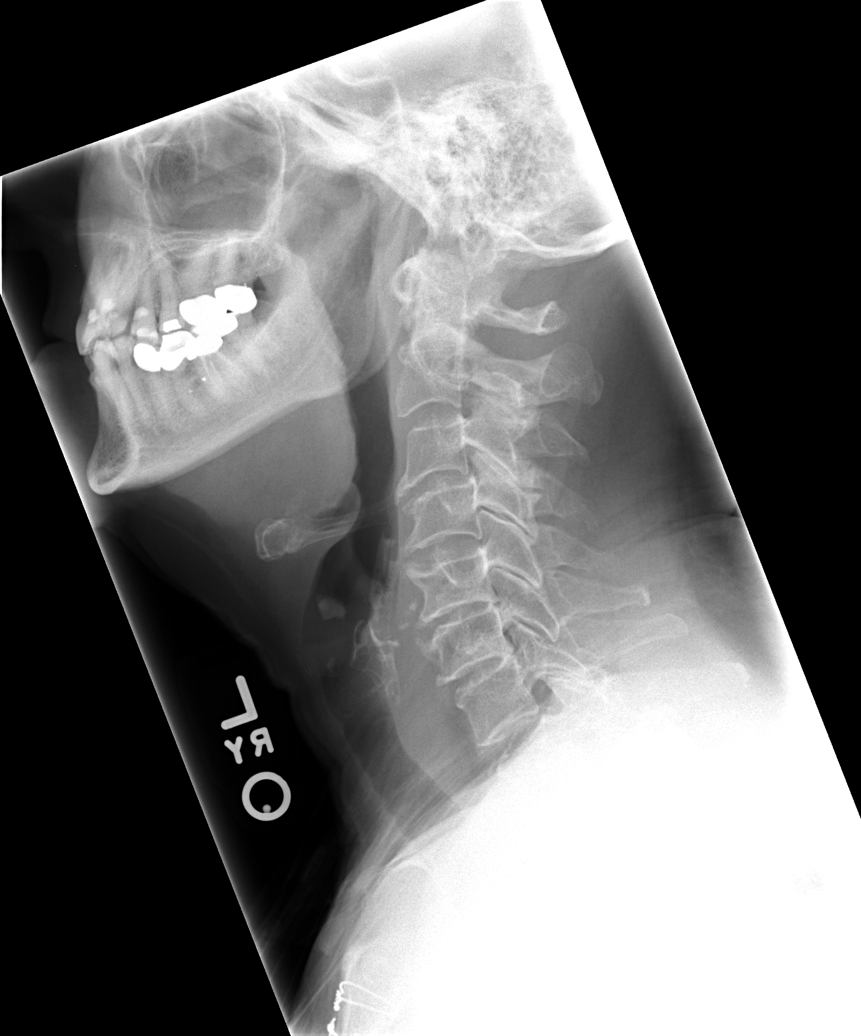

[view not recorded (2 of 6)]
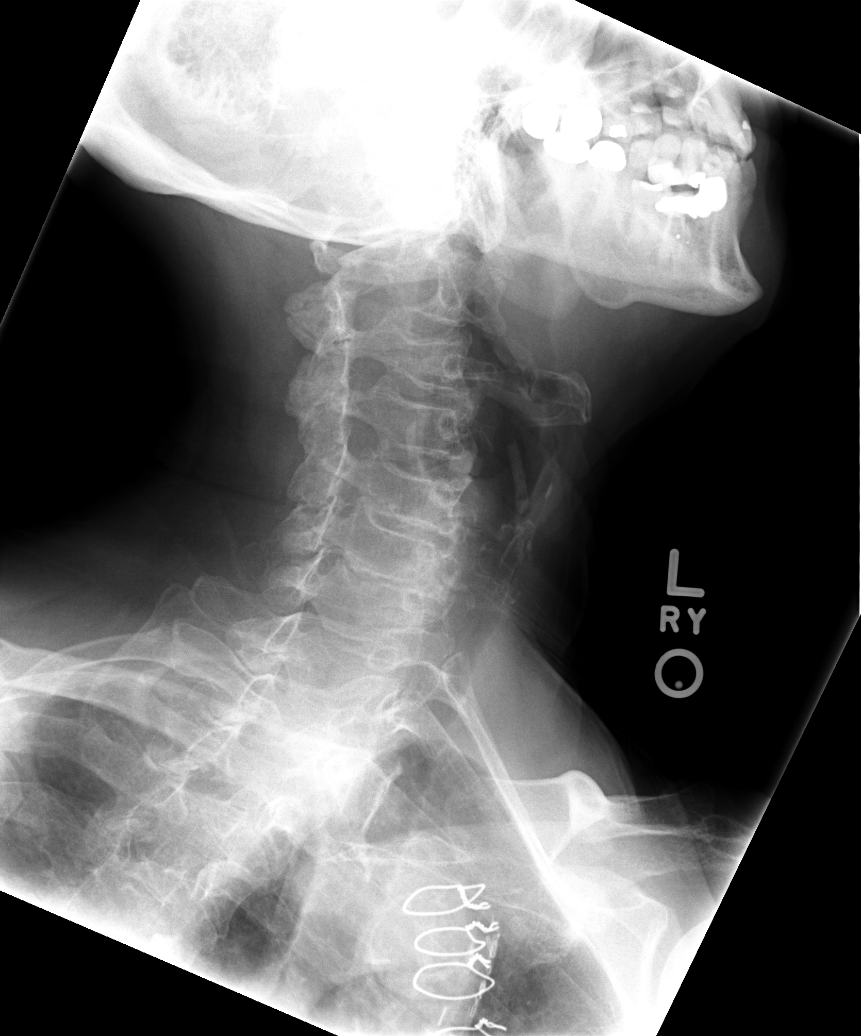

[view not recorded (3 of 6)]
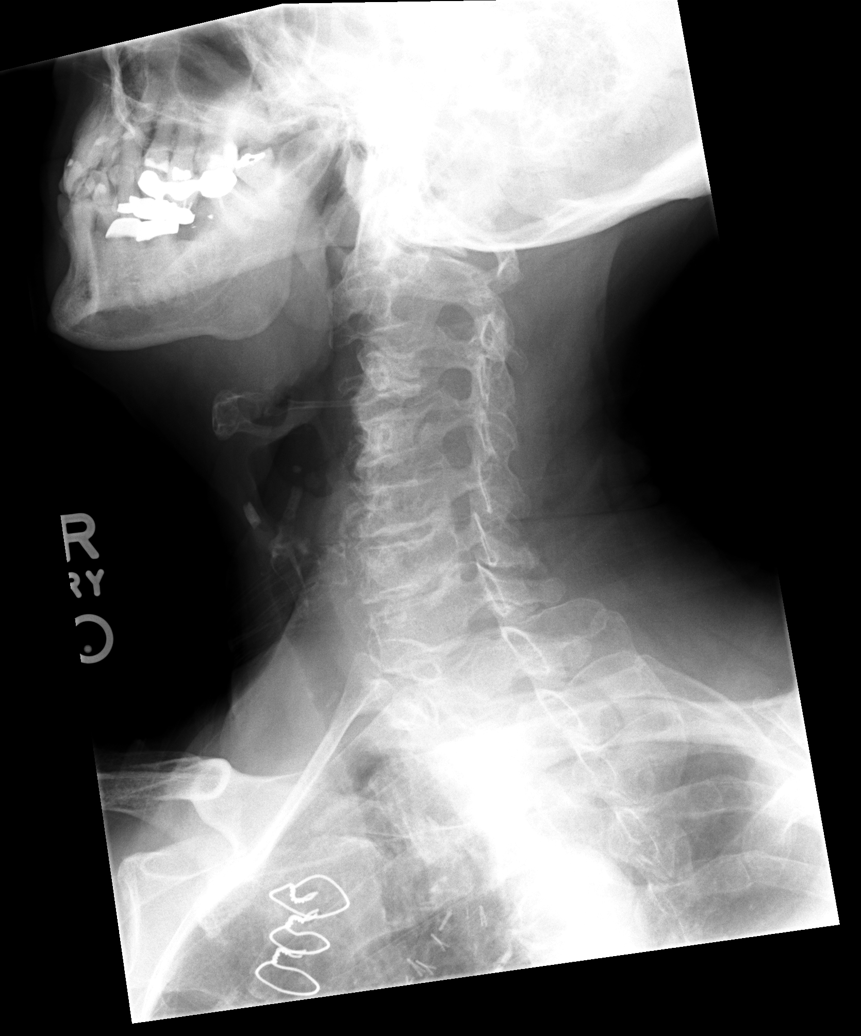

[view not recorded (4 of 6)]
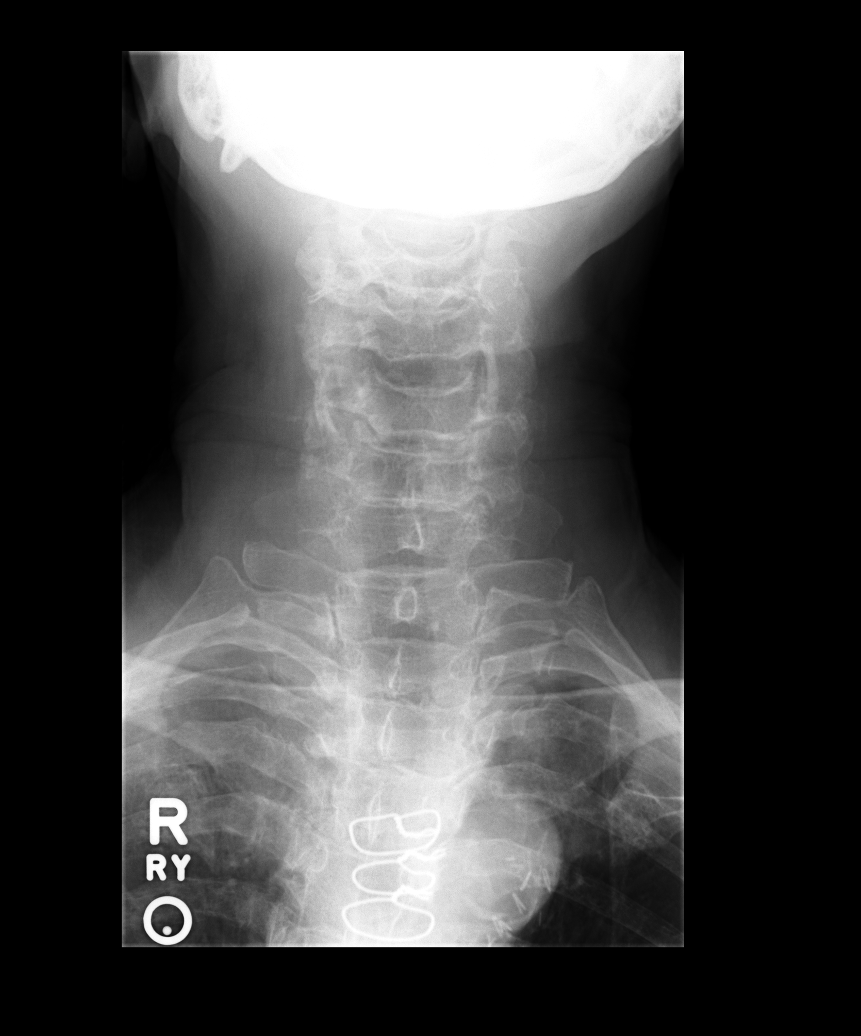

[view not recorded (5 of 6)]
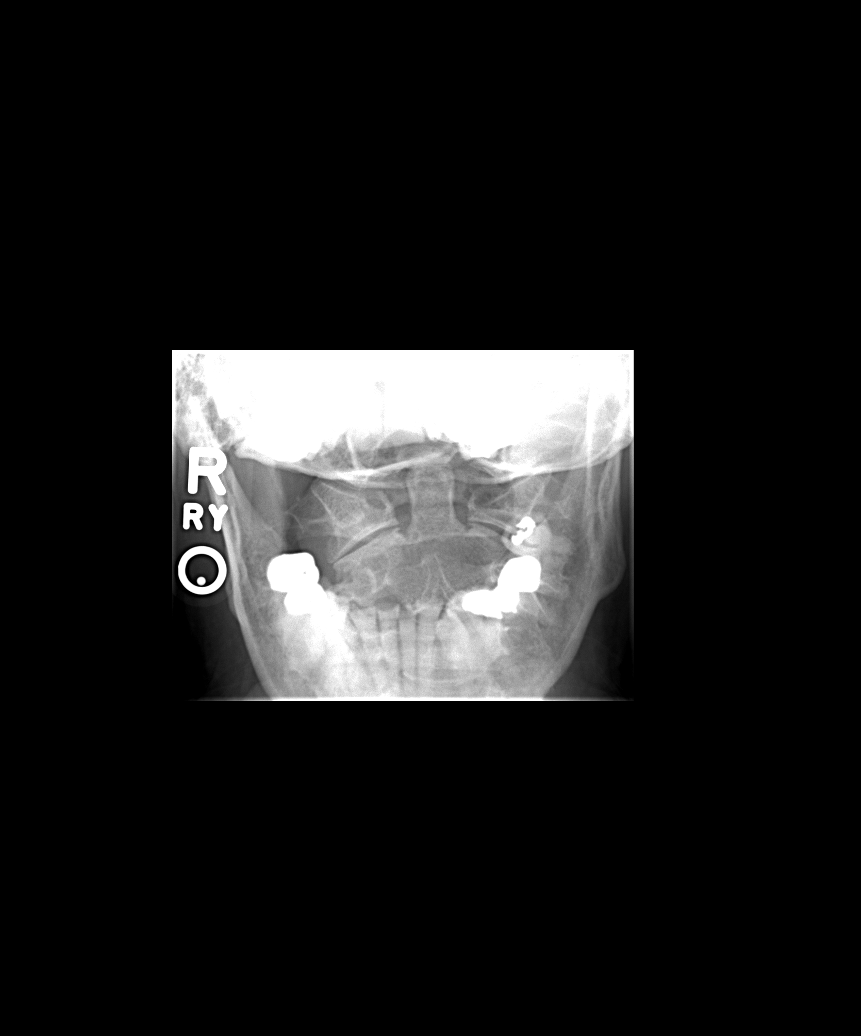

[view not recorded (6 of 6)]
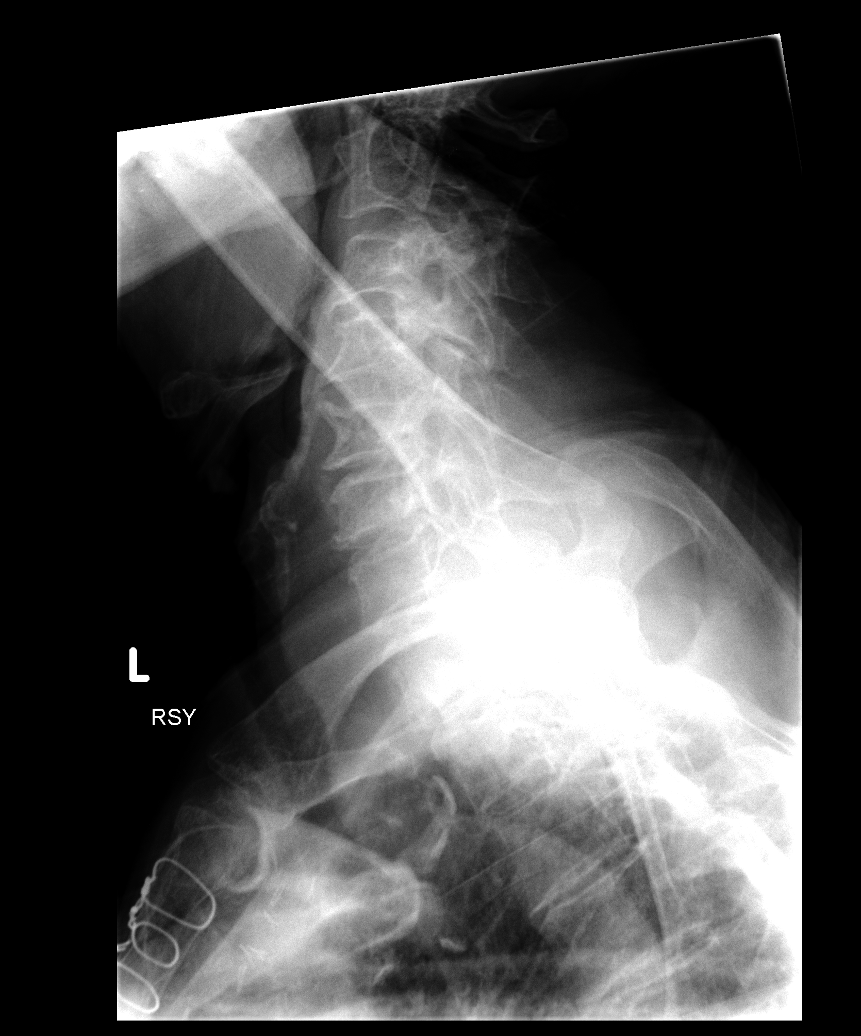

[6 of 6 positions shown; findings below may reference images not displayed]

FINDINGS: The lateral view images through bottom of C7.
Prevertebral soft tissues are within normal limits.  Maintenance of
vertebral body height and alignment.  Lower thoracic spondylosis.
Loss of intervertebral disc height at C5-C6 and C6-C7.  Prominent
anterior osteophytes at C3-C6.

Facets are well-aligned.

There is bilateral facet arthropathy.  Lateral masses are
symmetric.  The body of C2 is minimally obscured on the left.
Odontoid process intact.  Cervicothoracic junction unremarkable
anteriorly.  Partially obscured posteriorly.
IMPRESSION: 1.  Minimally degraded evaluation of the left lateral mass and
posterior aspect of the cervicothoracic junction.
2.  Otherwise, spondylosis, without acute finding.

## 2012-02-03 ENCOUNTER — Telehealth: Payer: Self-pay | Admitting: Internal Medicine

## 2012-02-05 ENCOUNTER — Other Ambulatory Visit: Payer: Self-pay

## 2012-02-05 MED ORDER — DIGOXIN 125 MCG PO TABS: 125.0000 ug | ORAL_TABLET | Freq: Every day | ORAL | Status: DC

## 2012-02-07 DIAGNOSIS — Z7901 Long term (current) use of anticoagulants: Secondary | ICD-10-CM | POA: Diagnosis not present

## 2012-02-07 DIAGNOSIS — M542 Cervicalgia: Secondary | ICD-10-CM | POA: Diagnosis not present

## 2012-02-07 DIAGNOSIS — M546 Pain in thoracic spine: Secondary | ICD-10-CM | POA: Diagnosis not present

## 2012-02-07 DIAGNOSIS — I4891 Unspecified atrial fibrillation: Secondary | ICD-10-CM | POA: Diagnosis not present

## 2012-02-14 ENCOUNTER — Other Ambulatory Visit: Payer: Self-pay

## 2012-02-14 DIAGNOSIS — M542 Cervicalgia: Secondary | ICD-10-CM | POA: Diagnosis not present

## 2012-02-14 DIAGNOSIS — Z7901 Long term (current) use of anticoagulants: Secondary | ICD-10-CM | POA: Diagnosis not present

## 2012-02-14 DIAGNOSIS — I4891 Unspecified atrial fibrillation: Secondary | ICD-10-CM | POA: Diagnosis not present

## 2012-02-14 MED ORDER — DIGOXIN 125 MCG PO TABS: 125.0000 ug | ORAL_TABLET | Freq: Every day | ORAL | Status: DC

## 2012-02-16 ENCOUNTER — Telehealth: Payer: Self-pay | Admitting: *Deleted

## 2012-02-16 DIAGNOSIS — R0602 Shortness of breath: Secondary | ICD-10-CM

## 2012-02-16 MED ORDER — DIGOXIN 125 MCG PO TABS: 125.0000 ug | ORAL_TABLET | Freq: Every day | ORAL | Status: DC

## 2012-02-16 NOTE — Telephone Encounter (Signed)
New Problem:     Patient called in because she has an issue with her medications that Dr. Ladona Ridgel prescribed for her and wished to speak with someone to straighten them out (namely her Lasix). Please call back.

## 2012-02-16 NOTE — Telephone Encounter (Signed)
Susan Conway--ms Saetern calling stating she'd like some clarification on recent ECHO report--evidentally Susan Conway called her and gave results and told her to increase lasix to 40mg --she has never taken lasix,nor has she ever c/o dyspnea--dr taylor states preserved lv func--mild pulmonary hypertension--increase lasix--is he referring to her ?--? wrong pt results--these folks would appreciate some explanation and i don't know if i have story correct--please check and call them--they're apprehensive--thanks nt

## 2012-02-19 MED ORDER — FUROSEMIDE 20 MG PO TABS: 20.0000 mg | ORAL_TABLET | Freq: Every day | ORAL | Status: DC

## 2012-02-19 NOTE — Telephone Encounter (Signed)
Patient aware of Dr Lubertha Basque recommendations and is going to try Furosemide 20mg  daily for a few weeks to see if it helps with her SOB She will casll [place] bck and let us know

## 2012-02-21 ENCOUNTER — Telehealth: Payer: Self-pay | Admitting: Internal Medicine

## 2012-02-21 NOTE — Telephone Encounter (Signed)
Spoke with patient and let her know that these medications are the same

## 2012-02-21 NOTE — Telephone Encounter (Signed)
Pt medication was changed from digoxin to lanoxin and she wants talk to you about it today to makes sure she really is suppose to be on the new medication

## 2012-02-28 DIAGNOSIS — Z7901 Long term (current) use of anticoagulants: Secondary | ICD-10-CM | POA: Diagnosis not present

## 2012-02-28 DIAGNOSIS — I4891 Unspecified atrial fibrillation: Secondary | ICD-10-CM | POA: Diagnosis not present

## 2012-03-18 ENCOUNTER — Telehealth: Payer: Self-pay | Admitting: Internal Medicine

## 2012-03-18 ENCOUNTER — Telehealth: Payer: Self-pay | Admitting: *Deleted

## 2012-03-18 NOTE — Telephone Encounter (Signed)
Error

## 2012-03-18 NOTE — Telephone Encounter (Signed)
Please return call to patient [phone] to discuss medications

## 2012-03-18 NOTE — Telephone Encounter (Signed)
She does not need to continue on the Furosemide if there is no improvement.  I have told her to d/c the Furosemide and she can have to take it prn

## 2012-03-21 ENCOUNTER — Telehealth: Payer: Self-pay | Admitting: Internal Medicine

## 2012-03-21 NOTE — Telephone Encounter (Signed)
Done

## 2012-03-21 NOTE — Telephone Encounter (Signed)
New msg Pt wants a copy of echo sent to her Please let her know

## 2012-03-27 DIAGNOSIS — Z7901 Long term (current) use of anticoagulants: Secondary | ICD-10-CM | POA: Diagnosis not present

## 2012-03-27 DIAGNOSIS — I4891 Unspecified atrial fibrillation: Secondary | ICD-10-CM | POA: Diagnosis not present

## 2012-04-02 ENCOUNTER — Encounter: Payer: Self-pay | Admitting: Internal Medicine

## 2012-04-02 DIAGNOSIS — I10 Essential (primary) hypertension: Secondary | ICD-10-CM | POA: Diagnosis not present

## 2012-04-02 DIAGNOSIS — I2510 Atherosclerotic heart disease of native coronary artery without angina pectoris: Secondary | ICD-10-CM | POA: Diagnosis not present

## 2012-04-02 DIAGNOSIS — I4891 Unspecified atrial fibrillation: Secondary | ICD-10-CM | POA: Diagnosis not present

## 2012-04-03 ENCOUNTER — Telehealth: Payer: Self-pay | Admitting: Internal Medicine

## 2012-04-03 DIAGNOSIS — I4891 Unspecified atrial fibrillation: Secondary | ICD-10-CM | POA: Diagnosis not present

## 2012-04-03 DIAGNOSIS — Z7901 Long term (current) use of anticoagulants: Secondary | ICD-10-CM | POA: Diagnosis not present

## 2012-04-03 NOTE — Telephone Encounter (Signed)
New msg Pt wants to talk to you about her coumadin readings . Please call her back

## 2012-04-03 NOTE — Telephone Encounter (Signed)
Claudette has suggested she take Pradaxa She does not know what dose  INR's have been up and down. 12/2011 until now  1.9-2.5 and  4.8 on 3/26.  She had lab work today  I have asked her to have it faxed to [place] At[place] Dr Ladona Ridgel to advise what dosage she should take  I advised her I would discuss with Dr Ladona Ridgel tomorrow and call her back with advise

## 2012-04-04 NOTE — Telephone Encounter (Signed)
She is going to talk with Dr Chilton Si on Monday and make the decision as to what is best for her

## 2012-04-08 DIAGNOSIS — R7989 Other specified abnormal findings of blood chemistry: Secondary | ICD-10-CM | POA: Diagnosis not present

## 2012-04-08 DIAGNOSIS — I10 Essential (primary) hypertension: Secondary | ICD-10-CM | POA: Diagnosis not present

## 2012-04-08 DIAGNOSIS — M542 Cervicalgia: Secondary | ICD-10-CM | POA: Diagnosis not present

## 2012-04-08 DIAGNOSIS — M2420 Disorder of ligament, unspecified site: Secondary | ICD-10-CM

## 2012-04-08 DIAGNOSIS — I4891 Unspecified atrial fibrillation: Secondary | ICD-10-CM | POA: Diagnosis not present

## 2012-04-08 DIAGNOSIS — M629 Disorder of muscle, unspecified: Secondary | ICD-10-CM

## 2012-04-08 HISTORY — DX: Disorder of muscle, unspecified: M62.9

## 2012-04-08 HISTORY — DX: Disorder of ligament, unspecified site: M24.20

## 2012-05-01 DIAGNOSIS — Z7901 Long term (current) use of anticoagulants: Secondary | ICD-10-CM | POA: Diagnosis not present

## 2012-05-01 DIAGNOSIS — I4891 Unspecified atrial fibrillation: Secondary | ICD-10-CM | POA: Diagnosis not present

## 2012-05-22 DIAGNOSIS — I4891 Unspecified atrial fibrillation: Secondary | ICD-10-CM | POA: Diagnosis not present

## 2012-05-22 DIAGNOSIS — Z7901 Long term (current) use of anticoagulants: Secondary | ICD-10-CM | POA: Diagnosis not present

## 2012-07-30 DIAGNOSIS — I10 Essential (primary) hypertension: Secondary | ICD-10-CM | POA: Diagnosis not present

## 2012-07-30 DIAGNOSIS — I2510 Atherosclerotic heart disease of native coronary artery without angina pectoris: Secondary | ICD-10-CM | POA: Diagnosis not present

## 2012-07-30 DIAGNOSIS — R7989 Other specified abnormal findings of blood chemistry: Secondary | ICD-10-CM | POA: Diagnosis not present

## 2012-08-12 DIAGNOSIS — I4891 Unspecified atrial fibrillation: Secondary | ICD-10-CM | POA: Diagnosis not present

## 2012-08-12 DIAGNOSIS — R7989 Other specified abnormal findings of blood chemistry: Secondary | ICD-10-CM | POA: Diagnosis not present

## 2012-08-12 DIAGNOSIS — M25519 Pain in unspecified shoulder: Secondary | ICD-10-CM

## 2012-08-12 DIAGNOSIS — I10 Essential (primary) hypertension: Secondary | ICD-10-CM | POA: Diagnosis not present

## 2012-08-12 DIAGNOSIS — M629 Disorder of muscle, unspecified: Secondary | ICD-10-CM | POA: Diagnosis not present

## 2012-08-12 HISTORY — DX: Pain in unspecified shoulder: M25.519

## 2012-08-20 DIAGNOSIS — Z23 Encounter for immunization: Secondary | ICD-10-CM | POA: Diagnosis not present

## 2012-08-26 ENCOUNTER — Encounter (INDEPENDENT_AMBULATORY_CARE_PROVIDER_SITE_OTHER): Payer: Self-pay | Admitting: Surgery

## 2012-08-26 ENCOUNTER — Ambulatory Visit (INDEPENDENT_AMBULATORY_CARE_PROVIDER_SITE_OTHER): Payer: Medicare Other | Admitting: Surgery

## 2012-08-26 VITALS — BP 156/96 | HR 76 | Temp 98.2°F | Resp 12 | Ht 66.0 in | Wt 135.2 lb

## 2012-08-26 DIAGNOSIS — K649 Unspecified hemorrhoids: Secondary | ICD-10-CM

## 2012-08-26 HISTORY — PX: HEMORRHOID SURGERY: SHX153

## 2012-08-26 NOTE — Progress Notes (Signed)
Patient ID: Susan Conway, female   DOB: 1929-07-22, 78 y.o.   MRN: [id number]  No chief complaint on file.   HPI Susan Conway is a 78 y.o. female.  Patient is sent at the request of Dr. Chilton Si to 2 hemorrhoid disease. Her hemorrhoids have to be reduced after each bowel movement. Minimal pain noted. Minimal bleeding noted. She's had this problem for multiple years. She is interested in learning treatment options. HPI  Past Medical History  Diagnosis Date  . PVC's (premature ventricular contractions)   . MI, old   . Osteoporosis     Past Surgical History  Procedure Date  . Coronary artery bypass graft     x2; LIMA to LAD; SVG to diagonal off bypass    Family History  Problem Relation Age of Onset  . Heart attack Father     Social History History  Substance Use Topics  . Smoking status: Never Smoker   . Smokeless tobacco: Never Used  . Alcohol Use: Yes     occasional    Allergies  Allergen Reactions  . Bactrim   . Iodinated Diagnostic Agents   . Nabumetone   . Sulfamethoxazole W-Trimethoprim     Current Outpatient Prescriptions  Medication Sig Dispense Refill  . aspirin 81 MG tablet Take 81 mg by mouth daily.        Marland Kitchen atenolol (TENORMIN) 25 MG tablet Take 12.5 mg by mouth daily.        . Calcium-Vitamin D-Vitamin K (CALCIUM SOFT CHEWS PO) Take by mouth daily.        . dabigatran (PRADAXA) 150 MG CAPS Take 150 mg by mouth 2 (two) times daily.      . digoxin (LANOXIN) 0.125 MG tablet Take 1 tablet (125 mcg total) by mouth daily.  30 tablet  8  . HYDROcodone-acetaminophen (VICODIN ES) 7.5-750 MG per tablet as needed.        Marland Kitchen LORazepam (ATIVAN) 1 MG tablet Take 1 mg by mouth as needed.        . pravastatin (PRAVACHOL) 20 MG tablet Take 20 mg by mouth daily.      . pregabalin (LYRICA) 25 MG capsule as needed.        Marland Kitchen DISCONTD: digoxin (LANOXIN) 0.125 MG tablet Take 1 tablet (125 mcg total) by mouth daily.  30 tablet  8    Review of Systems Review of Systems    Constitutional: Negative for fever, chills and unexpected weight change.  HENT: Negative for hearing loss, congestion, sore throat, trouble swallowing and voice change.   Eyes: Negative for visual disturbance.  Respiratory: Negative for cough and wheezing.   Cardiovascular: Negative for chest pain, palpitations and leg swelling.  Gastrointestinal: Negative for nausea, vomiting, abdominal pain, diarrhea, constipation, blood in stool, abdominal distention and anal bleeding.  Genitourinary: Negative for hematuria, vaginal bleeding and difficulty urinating.  Musculoskeletal: Negative for arthralgias.  Skin: Negative for rash and wound.  Neurological: Negative for seizures, syncope and headaches.  Hematological: Negative for adenopathy. Does not bruise/bleed easily.  Psychiatric/Behavioral: Negative for confusion.    Blood pressure 156/96, pulse 76, temperature 98.2 F (36.8 C), resp. rate 12, height 5\' 6"  (1.676 m), weight 135 lb 3.2 oz (61.326 kg).  Physical Exam Physical Exam  Constitutional: She appears well-developed and well-nourished.  HENT:  Head: Normocephalic and atraumatic.  Eyes: EOM are normal. Pupils are equal, round, and reactive to light.  Neck: Normal range of motion. Neck supple.  Cardiovascular: Normal rate and regular  rhythm.   Pulmonary/Chest: Effort normal and breath sounds normal.  Abdominal: Soft. Bowel sounds are normal.  Genitourinary:         Assessment    Grade 3 internal hemorrhoid disease    Plan    Discussed options of medical versus surgical intervention. Discussed office-based intervention of sclerotherapy versus banding. Discussed hemorrhoidectomy. She would like to pursue sclerotherapy in the office. Her  Pradaxa will need to be held for 5 days. I will contact her cardiologist for an opinion. Information given. Risk of procedure discussed with patient has been and success rates. She will return in 3 weeks in followup for sclerotherapy.        Shamell Hittle A. 08/26/2012, 12:21 PM

## 2012-08-26 NOTE — Patient Instructions (Signed)

## 2012-09-06 ENCOUNTER — Telehealth (INDEPENDENT_AMBULATORY_CARE_PROVIDER_SITE_OTHER): Payer: Self-pay | Admitting: General Surgery

## 2012-09-06 ENCOUNTER — Telehealth (INDEPENDENT_AMBULATORY_CARE_PROVIDER_SITE_OTHER): Payer: Self-pay

## 2012-09-06 NOTE — Telephone Encounter (Signed)
Pt calling to see if Dr Luisa Hart has received ok for pt to d/c prodaxa prior to surgery. Pt is awaiting a call back from [place]. Pt advised I do not see a response in system for this but will send a msg to [place] to call pt. [phone].

## 2012-09-06 NOTE — Telephone Encounter (Signed)
Patient called in and stated that she was advised per Claudette who works with Dr. Neva Seat at [facility] she can stop taking her Pradaxa the day before the surgery and resume post op. Patient stated she is scheduled to have surgery 09/16/12. Advised information would be forwarded to Dr. Luisa Hart and Marcelino Duster as an fyi. Patient also wanted to know status of contact with cardiologist (Dr. Ladona Ridgel).

## 2012-09-09 NOTE — Telephone Encounter (Signed)
Called patient to let her know I have not got clearance from Dr. Ladona Ridgel to clear her for procedure and to stop Pradaxa for 5 days. She states Dr Neva Seat prescribed the pradaxa and she said she could stop day before and start back next day after procedure. I told her we need her to stop it 5 days before procedure and still need clearance from Dr. Ladona Ridgel. I contacted Dr. Lubertha Basque office and they said they did not get request for clearance but that they have had fax machine problems and that could be why they didn't get request. I refaxed it today and will call in the morning to check on it. Patient understood all this information.

## 2012-09-11 ENCOUNTER — Telehealth: Payer: Self-pay | Admitting: Internal Medicine

## 2012-09-11 NOTE — Telephone Encounter (Signed)
New problem:   Status of cardiac clearance. Need to stop pradaxa today .  Surgery on  11/11.

## 2012-09-12 ENCOUNTER — Telehealth: Payer: Self-pay | Admitting: Internal Medicine

## 2012-09-12 ENCOUNTER — Telehealth (INDEPENDENT_AMBULATORY_CARE_PROVIDER_SITE_OTHER): Payer: Self-pay

## 2012-09-12 NOTE — Telephone Encounter (Signed)
New problem:    Upcoming procedure - Sclera therapy surgery date on  11/11 @ 11 am . need to stop Pradaxa .  Please advise.   Fax information back to Dr. Harriette Bouillon office # [phone].

## 2012-09-12 NOTE — Telephone Encounter (Signed)
Per pt the Surgeon office called with the information the pt was looking for right after she spoke with Marlowe Kays so she did not need a return call

## 2012-09-12 NOTE — Telephone Encounter (Signed)
Clearance faxed  Dr Ladona Ridgel signed

## 2012-09-12 NOTE — Telephone Encounter (Signed)
Called patient and told her Dr. Ladona Ridgel cleared her for procedure. She needs to stop pradaxa until after procedure. Patient understood.

## 2012-09-16 ENCOUNTER — Encounter (INDEPENDENT_AMBULATORY_CARE_PROVIDER_SITE_OTHER): Payer: Self-pay | Admitting: Surgery

## 2012-09-16 ENCOUNTER — Ambulatory Visit (INDEPENDENT_AMBULATORY_CARE_PROVIDER_SITE_OTHER): Payer: Medicare Other | Admitting: Surgery

## 2012-09-16 VITALS — BP 138/84 | HR 89 | Temp 97.2°F | Ht 66.0 in | Wt 137.2 lb

## 2012-09-16 DIAGNOSIS — K649 Unspecified hemorrhoids: Secondary | ICD-10-CM | POA: Diagnosis not present

## 2012-09-16 NOTE — Progress Notes (Signed)
Subjective:     Patient ID: Susan Conway, female   DOB: 1929/03/21, 78 y.o.   MRN: [id number]  HPI patient returns today for sclerotherapy of her internal hemorrhoid disease. She has no complaints. She held her blood thinner 5 days ago. Procedure discussed with the patient to include risk of bleeding, infection, need for additional procedures, post procedure expectations, post procedure instructions, and followup schedule. She voiced understanding and agreed to proceed.   Review of Systems  Constitutional: Negative.   Respiratory: Negative.   Cardiovascular: Negative.   Gastrointestinal: Negative.        Objective:   Physical Exam  Constitutional: She is oriented to person, place, and time. She appears well-developed and well-nourished.  HENT:  Head: Normocephalic and atraumatic.  Genitourinary:     Neurological: She is alert and oriented to person, place, and time.  Skin: Skin is warm and dry.       Assessment:     Grade 2 internal hemorrhoids with history of bleeding and pain    Plan:     Injected at office without difficulty. Post procedure instructions given. The patient tolerated procedure well. Recommend high fiber diet, increasing water intake to 64 ounces a and avoid straining or sitting on commode for prolonged periods of time. Return one month to recheck.

## 2012-09-16 NOTE — Patient Instructions (Signed)
High-Fiber Diet Fiber is found in fruits, vegetables, and grains. A high-fiber diet encourages the addition of more whole grains, legumes, fruits, and vegetables in your diet. The recommended amount of fiber for adult males is 38 g per day. For adult females, it is 25 g per day. Pregnant and lactating women should get 28 g of fiber per day. If you have a digestive or bowel problem, ask your caregiver for advice before adding high-fiber foods to your diet. Eat a variety of high-fiber foods instead of only a select few type of foods.  PURPOSE  To increase stool bulk.  To make bowel movements more regular to prevent constipation.  To lower cholesterol.  To prevent overeating. WHEN IS THIS DIET USED?  It may be used if you have constipation and hemorrhoids.  It may be used if you have uncomplicated diverticulosis (intestine condition) and irritable bowel syndrome.  It may be used if you need help with weight management.  It may be used if you want to add it to your diet as a protective measure against atherosclerosis, diabetes, and cancer. SOURCES OF FIBER  Whole-grain breads and cereals.  Fruits, such as apples, oranges, bananas, berries, prunes, and pears.  Vegetables, such as green peas, carrots, sweet potatoes, beets, broccoli, cabbage, spinach, and artichokes.  Legumes, such split peas, soy, lentils.  Almonds. FIBER CONTENT IN FOODS Starches and Grains / Dietary Fiber (g)  Cheerios, 1 cup / 3 g  Corn Flakes cereal, 1 cup / 0.7 g  Rice crispy treat cereal, 1 cup / 0.3 g  Instant oatmeal (cooked),  cup / 2 g  Frosted wheat cereal, 1 cup / 5.1 g  Brown, long-grain rice (cooked), 1 cup / 3.5 g  White, long-grain rice (cooked), 1 cup / 0.6 g  Enriched macaroni (cooked), 1 cup / 2.5 g Legumes / Dietary Fiber (g)  Baked beans (canned, plain, or vegetarian),  cup / 5.2 g  Kidney beans (canned),  cup / 6.8 g  Pinto beans (cooked),  cup / 5.5 g Breads and Crackers  / Dietary Fiber (g)  Plain or honey graham crackers, 2 squares / 0.7 g  Saltine crackers, 3 squares / 0.3 g  Plain, salted pretzels, 10 pieces / 1.8 g  Whole-wheat bread, 1 slice / 1.9 g  White bread, 1 slice / 0.7 g  Raisin bread, 1 slice / 1.2 g  Plain bagel, 3 oz / 2 g  Flour tortilla, 1 oz / 0.9 g  Corn tortilla, 1 small / 1.5 g  Hamburger or hotdog bun, 1 small / 0.9 g Fruits / Dietary Fiber (g)  Apple with skin, 1 medium / 4.4 g  Sweetened applesauce,  cup / 1.5 g  Banana,  medium / 1.5 g  Grapes, 10 grapes / 0.4 g  Orange, 1 small / 2.3 g  Raisin, 1.5 oz / 1.6 g  Melon, 1 cup / 1.4 g Vegetables / Dietary Fiber (g)  Green beans (canned),  cup / 1.3 g  Carrots (cooked),  cup / 2.3 g  Broccoli (cooked),  cup / 2.8 g  Peas (cooked),  cup / 4.4 g  Mashed potatoes,  cup / 1.6 g  Lettuce, 1 cup / 0.5 g  Corn (canned),  cup / 1.6 g  Tomato,  cup / 1.1 g Document Released: 10/23/2005 Document Revised: 04/23/2012 Document Reviewed: 01/25/2012 [employer] Patient Information 2013 [employer].  

## 2012-09-24 ENCOUNTER — Other Ambulatory Visit: Payer: Self-pay | Admitting: Internal Medicine

## 2012-09-24 DIAGNOSIS — Z1231 Encounter for screening mammogram for malignant neoplasm of breast: Secondary | ICD-10-CM

## 2012-10-21 ENCOUNTER — Encounter (INDEPENDENT_AMBULATORY_CARE_PROVIDER_SITE_OTHER): Payer: Self-pay | Admitting: Surgery

## 2012-10-21 ENCOUNTER — Ambulatory Visit (INDEPENDENT_AMBULATORY_CARE_PROVIDER_SITE_OTHER): Payer: Medicare Other | Admitting: Surgery

## 2012-10-21 VITALS — BP 146/88 | HR 72 | Temp 97.7°F | Resp 14 | Ht 66.0 in | Wt 135.2 lb

## 2012-10-21 DIAGNOSIS — K649 Unspecified hemorrhoids: Secondary | ICD-10-CM

## 2012-10-21 NOTE — Progress Notes (Signed)
Patient returns after sclerotherapy for hemorrhoids. She has noticed improvement. The bleeding and prolapse are much better.  Exam: There is significant reduction in size of hemorrhoid complexes. No signs of infection.  Impression: Status post sclerotherapy for grade 2 to grade 3 internal hemorrhoids with improvement  Plan: Return to office as needed for additional therapy.

## 2012-10-21 NOTE — Patient Instructions (Addendum)
Return if symptoms recurr

## 2012-11-14 ENCOUNTER — Ambulatory Visit
Admission: RE | Admit: 2012-11-14 | Discharge: 2012-11-14 | Disposition: A | Discharge status: Home / Self Care | Payer: Medicare Other | Source: Ambulatory Visit | Attending: Internal Medicine | Admitting: Internal Medicine

## 2012-11-14 DIAGNOSIS — Z1231 Encounter for screening mammogram for malignant neoplasm of breast: Secondary | ICD-10-CM

## 2012-11-25 ENCOUNTER — Telehealth (INDEPENDENT_AMBULATORY_CARE_PROVIDER_SITE_OTHER): Payer: Self-pay

## 2012-11-25 NOTE — Telephone Encounter (Signed)
Message copied by Brennan Bailey on Mon Nov 25, 2012 12:15 PM ------      Message from: Zacarias Pontes      Created: Fri Nov 22, 2012  3:23 PM       Pt has a follow up apt on 1/31 for hems,she said that she had a procedure here in the office and wants that done again on the 31st.She wants to know if she needs to stop her pradaxa 5 days before like she did in Nov.She is waithing on a call back from you

## 2012-11-25 NOTE — Telephone Encounter (Signed)
Called pt back. She states symptoms better. She cancelled her appt she had scheduled on 1/31 and will call back if she fills she needs to come in.

## 2012-12-06 ENCOUNTER — Other Ambulatory Visit: Payer: Self-pay | Admitting: Internal Medicine

## 2012-12-06 ENCOUNTER — Encounter (INDEPENDENT_AMBULATORY_CARE_PROVIDER_SITE_OTHER): Payer: Medicare Other | Admitting: Surgery

## 2012-12-06 DIAGNOSIS — R928 Other abnormal and inconclusive findings on diagnostic imaging of breast: Secondary | ICD-10-CM

## 2012-12-19 ENCOUNTER — Other Ambulatory Visit: Payer: Medicare Other

## 2012-12-31 ENCOUNTER — Ambulatory Visit
Admission: RE | Admit: 2012-12-31 | Discharge: 2012-12-31 | Disposition: A | Discharge status: Home / Self Care | Payer: Medicare Other | Source: Ambulatory Visit | Attending: Internal Medicine | Admitting: Internal Medicine

## 2013-01-13 ENCOUNTER — Ambulatory Visit (INDEPENDENT_AMBULATORY_CARE_PROVIDER_SITE_OTHER): Payer: Medicare Other | Admitting: Internal Medicine

## 2013-01-13 ENCOUNTER — Encounter: Payer: Self-pay | Admitting: Internal Medicine

## 2013-01-13 VITALS — BP 140/88 | HR 81 | Ht 66.5 in | Wt 137.0 lb

## 2013-01-13 NOTE — Patient Instructions (Addendum)
Your physician wants you to follow-up in: ONE YEAR WITH DR TAYLOR You will receive a reminder letter in the mail two months in advance. If you don't receive a letter, please call our office to schedule the follow-up appointment.  

## 2013-01-13 NOTE — Assessment & Plan Note (Signed)
Despite her vigorous exercise, she has no anginal symptoms. She will continue her current medical therapy.

## 2013-01-13 NOTE — Progress Notes (Signed)
HPI [name] returns today for followup. She is a very pleasant 79 year old woman with coronary artery disease status post bypass surgery, chronic atrial fibrillation, and dyslipidemia. In the interim, she has done well. She denies chest pain, shortness of breath, or peripheral edema. No syncope. She remains active walking almost daily. Allergies  Allergen Reactions  . Bactrim   . Iodinated Diagnostic Agents   . Nabumetone   . Sulfamethoxazole W-Trimethoprim      Current Outpatient Prescriptions  Medication Sig Dispense Refill  . aspirin 81 MG tablet Take 81 mg by mouth daily.        Marland Kitchen atenolol (TENORMIN) 25 MG tablet Take 12.5 mg by mouth daily.        . Calcium-Vitamin D-Vitamin K (CALCIUM SOFT CHEWS PO) Take by mouth daily.        . dabigatran (PRADAXA) 150 MG CAPS Take 150 mg by mouth 2 (two) times daily.      . digoxin (LANOXIN) 0.125 MG tablet TAKE ONE TABLET BY MOUTH EVERY OTHER DAY      . HYDROcodone-acetaminophen (VICODIN ES) 7.5-750 MG per tablet as needed.        Marland Kitchen LORazepam (ATIVAN) 1 MG tablet Take 1 mg by mouth as needed.        . pravastatin (PRAVACHOL) 20 MG tablet Take 20 mg by mouth daily.       No current facility-administered medications for this visit.     Past Medical History  Diagnosis Date  . PVC's (premature ventricular contractions)   . MI, old   . Osteoporosis     ROS:   All systems reviewed and negative except as noted in the HPI.   Past Surgical History  Procedure Laterality Date  . Coronary artery bypass graft      x2; LIMA to LAD; SVG to diagonal off bypass     Family History  Problem Relation Age of Onset  . Heart attack Father      History   Social History  . Marital Status: Married    Spouse Name: N/A    Number of Children: N/A  . Years of Education: N/A   Occupational History  . Retired    Social History Main Topics  . Smoking status: Never Smoker   . Smokeless tobacco: Never Used  . Alcohol Use: Yes     Comment:  occasional  . Drug Use: No  . Sexually Active: Not on file   Other Topics Concern  . Not on file   Social History Narrative   Living at [facility].     BP 140/88  Pulse 81  Ht 5' 6.5" (1.689 m)  Wt 137 lb (62.143 kg)  BMI 21.78 kg/m2  Physical Exam:  Well appearing elderly woman, NAD HEENT: Unremarkable Neck:  6 cm JVD, no thyromegally Lungs:  Clear with no wheezes, rales, or rhonchi. HEART:  IRegular rate rhythm, no murmurs, no rubs, no clicks Abd:  soft, positive bowel sounds, no organomegally, no rebound, no guarding Ext:  2 plus pulses, no edema, no cyanosis, no clubbing Skin:  No rashes no nodules Neuro:  CN II through XII intact, motor grossly intact  EKG - atrial fibrillation with a controlled ventricular response.   Assess/Plan:

## 2013-01-13 NOTE — Assessment & Plan Note (Signed)
Her ventricular rate is well controlled. She is tolerating her anticoagulation very nicely. No change in medical therapy today. Plan to recheck in several months.

## 2013-02-05 ENCOUNTER — Encounter (INDEPENDENT_AMBULATORY_CARE_PROVIDER_SITE_OTHER): Payer: Self-pay

## 2013-02-25 ENCOUNTER — Other Ambulatory Visit: Payer: Self-pay

## 2013-02-25 DIAGNOSIS — I4891 Unspecified atrial fibrillation: Secondary | ICD-10-CM

## 2013-02-25 MED ORDER — DIGOXIN 125 MCG PO TABS: ORAL_TABLET | ORAL | Status: DC

## 2013-02-25 MED ORDER — LORAZEPAM 1 MG PO TABS: ORAL_TABLET | ORAL | Status: DC

## 2013-03-19 ENCOUNTER — Non-Acute Institutional Stay: Payer: Medicare Other | Admitting: Geriatric Medicine

## 2013-03-19 VITALS — BP 142/82 | HR 80 | Ht 66.5 in | Wt 136.0 lb

## 2013-03-19 DIAGNOSIS — Z66 Do not resuscitate: Secondary | ICD-10-CM | POA: Diagnosis not present

## 2013-03-19 NOTE — Progress Notes (Signed)
Patient ID: Susan Conway, female   DOB: 1929-03-12, 79 y.o.   MRN: [id number] [facility] [phone])  Chief Complaint  Patient presents with  . Advanced Directives    HPI: This 79 year old female resident of [facility], Independent Living section presents to clinic today to discuss advanced directives, specifically DO NOT RESUSCITATE order and Medical Orders for Scope of Treatment (MOST) form. Patient indicates she has lived a long life, feels very well> She would not like to be in a situation where her life is being supported by machines. Patient understands a DO NOT RESUSCITATE order indicates that should her heart stop no CPR, external defibrillation or medications will be used to restart it.  We discussed the decisions required to complete the MOST form. Patient has chosen A.  DNR.  B. Limited additional interventions, accepts hospital transfer if indicated, avoid Intensive Care. C. Use antibiotics D. Use IVF for defined period. No feeding tube.   Allergies  Allergies  Allergen Reactions  . Bactrim   . Iodinated Diagnostic Agents   . Nabumetone   . Sulfamethoxazole W-Trimethoprim    Medications  Reviewed  Review of Systems  DATA OBTAINED: from patient,  GENERAL: Feels well No fevers, fatigue, change in appetite or weight MOUTH/THROAT: No mouth or tooth pain  No difficulty chewing or swallowing RESPIRATORY: No cough, wheezing, SOB CARDIAC: No chest pain, palpitations  No edema. NEUROLOGIC: No dizziness, fainting, headache, No change in mental status.  PSYCHIATRIC: No feelings of anxiety, depression Sleeps well.    Physical Exam Filed Vitals:   03/19/13 1505  BP: 142/82  Pulse: 80  Height: 5' 6.5" (1.689 m)  Weight: 136 lb (61.689 kg)   Body mass index is 21.62 kg/(m^2).  GENERAL APPEARANCE: No acute distress, appropriately groomed, normal body habitus. Alert, pleasant, conversant. HEAD: Normocephalic, atraumatic EYES:  Conjunctiva/lids clear. Marland Kitchen  EARS: Decreased hearing  NOSE: No deformity or discharge. MOUTH/THROAT: Lips w/o lesions. RESPIRATORY: Breathing is even, unlabored.  MUSCULOSKELETAL:  Gait is steady NEUROLOGIC: Oriented to time, place, person. Speech clear, no tremor. PSYCHIATRIC: Mood and affect appropriate to situation  ASSESSMENT/PLAN  Patient appears well, vital signs stable, tolerating medications without adverse effects. Patient has indicated a desire to complete Advanced Directives as communication tools to be used in the event she cannot speak for herself. I signed a DO NOT RESUSCITATE, and we completed a MOST form.  Follow up: Keep scheduled appointment  with Dr. Annamary Carolin T.Sharmaine Bain, NP-C 03/19/2013

## 2013-04-18 ENCOUNTER — Encounter: Payer: Self-pay | Admitting: *Deleted

## 2013-04-22 DIAGNOSIS — E039 Hypothyroidism, unspecified: Secondary | ICD-10-CM | POA: Diagnosis not present

## 2013-04-22 DIAGNOSIS — I10 Essential (primary) hypertension: Secondary | ICD-10-CM | POA: Diagnosis not present

## 2013-04-22 DIAGNOSIS — E785 Hyperlipidemia, unspecified: Secondary | ICD-10-CM | POA: Diagnosis not present

## 2013-04-28 ENCOUNTER — Encounter: Payer: Self-pay | Admitting: Internal Medicine

## 2013-04-28 ENCOUNTER — Non-Acute Institutional Stay: Payer: Medicare Other | Admitting: Internal Medicine

## 2013-04-28 VITALS — BP 120/80 | HR 84 | Temp 96.8°F | Ht 66.5 in | Wt 138.0 lb

## 2013-04-28 DIAGNOSIS — M25519 Pain in unspecified shoulder: Secondary | ICD-10-CM

## 2013-04-28 DIAGNOSIS — G5710 Meralgia paresthetica, unspecified lower limb: Secondary | ICD-10-CM | POA: Diagnosis not present

## 2013-04-28 DIAGNOSIS — M25512 Pain in left shoulder: Secondary | ICD-10-CM

## 2013-04-28 DIAGNOSIS — M542 Cervicalgia: Secondary | ICD-10-CM

## 2013-04-28 DIAGNOSIS — R739 Hyperglycemia, unspecified: Secondary | ICD-10-CM | POA: Insufficient documentation

## 2013-04-28 DIAGNOSIS — I4891 Unspecified atrial fibrillation: Secondary | ICD-10-CM | POA: Diagnosis not present

## 2013-04-28 DIAGNOSIS — I10 Essential (primary) hypertension: Secondary | ICD-10-CM

## 2013-04-28 DIAGNOSIS — K649 Unspecified hemorrhoids: Secondary | ICD-10-CM

## 2013-04-28 DIAGNOSIS — R7989 Other specified abnormal findings of blood chemistry: Secondary | ICD-10-CM

## 2013-04-28 DIAGNOSIS — G5711 Meralgia paresthetica, right lower limb: Secondary | ICD-10-CM

## 2013-06-04 DIAGNOSIS — H2510 Age-related nuclear cataract, unspecified eye: Secondary | ICD-10-CM | POA: Diagnosis not present

## 2013-06-04 DIAGNOSIS — H5210 Myopia, unspecified eye: Secondary | ICD-10-CM | POA: Diagnosis not present

## 2013-06-21 DIAGNOSIS — M542 Cervicalgia: Secondary | ICD-10-CM | POA: Insufficient documentation

## 2013-06-21 DIAGNOSIS — M25519 Pain in unspecified shoulder: Secondary | ICD-10-CM | POA: Insufficient documentation

## 2013-06-21 NOTE — Progress Notes (Signed)
Date: 06/21/2013  MRN:  [id number] Name:  Susan Conway Sex:  female Age:  79 y.o. DOB:1929-08-11                   Facility/Room; [facility] Level Of Care: Independent  Emergency Contacts: Contact Information   Name Relation Home Work Mobile   Younker,Charles Spouse [phone]        Code Status: DO NOT RESUSCITATE. Living will.  Allergies: Allergies  Allergen Reactions  . Iodinated Diagnostic Agents   . Sulfamethoxazole W-Trimethoprim   . Bactrim Rash  . Relafen [Nabumetone] Rash     Chief Complaint  Patient presents with  . Medical Managment of Chronic Issues    Comprehensive exam blood pressure, A-Fib, thyroid      HPI: 79 year old female resident of [facility] reports today for a complete exam and review of her medical problems.  Right central conjunctival hemorrhage occurred 2 days prior to this visit.   Patient reports a scaly patch of skin right cheek which has been there for months. It does not itch. There has been no bleeding.  Meralgia paresthetica, right: Chronic discomfort in the right thigh area  Unspecified essential hypertension: Controlled  Hemorrhoids: Has had sclerotherapy by surgeon, Dr. Luisa Hart 08/26/12. Denies pain or bleeding at this time  Atrial fibrillation: Diagnosis 09/07/10 by cardiologist, Dr. Ladona Ridgel. Anticoagulated with Pradaxa  Other abnormal blood chemistry: History of elevated blood sugars. Most recent lab was normal.  Pain in joint, shoulder region, left: Chronic discomfort in the left shoulder with some inhibition of motion  Cervicalgia: Chronic and under fairly good control  Coronary artery disease: No angina. Denies dyspnea on exertion, orthopnea, pedal edema, palpitations, or PND. He does take aspirin and Pradaxa. Electrocardiograms have shown sinus rhythm and an old anteroseptal MI. Her last nuclear stress test was 2008.  Hypothyroid: Elevated TSH, otherwise asymptomatic  Senile osteoporosis: To Fosamax for 14 years. A  vertebral fracture 2006. Most recent DEXA suggested intermediate fracture of the and normal risk of the vertebra. Fosamax was stopped August 2011. Her only current treatment is calcium.    Past Medical History  Diagnosis Date  . PVC's (premature ventricular contractions)   . MI, old   . Osteoporosis   . Pain in joint, shoulder region 08/12/2012  . Other disorder of muscle, ligament, and fascia 04/08/2012  . Other abnormal blood chemistry 04/08/2012  . Degeneration of lumbar or lumbosacral intervertebral disc 01/31/2012  . Cervicalgia 01/31/2012  . Pain in thoracic spine 01/31/2012  . 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 Myalgia and myositis, unspecified 01/17/2012  . Dizziness and giddiness 11/08/2011    vertigo  . Rash and other nonspecific skin eruption 08/02/2011  . Unspecified hypothyroidism 07/03/2011  . Pain in limb 01/11/2011  . Pain in joint, upper arm 12/26/2010  . Atrial fibrillation 09/21/2010  . Long term (current) use of anticoagulants 09/2010  . Other specified cardiac dysrhythmias(427.89) 06/27/2010  . External hemorrhoids without mention of complication 06/27/2010  . Varicose veins of lower extremities 08/16/2009  . Ganglion of tendon sheath 08/16/2009  . Cramp of limb 08/16/2009  . Disturbance of skin sensation 08/2009  . Lumbago 07/2009  . Meralgia paresthetica 07/2007  . Closed fracture of lumbar vertebra without mention of spinal cord injury 07/17/2005  . Unspecified essential hypertension 07/17/1997  . Coronary atherosclerosis of native coronary artery 07/17/1997  . Senile osteoporosis 07/17/1993  . Bell's palsy 07/18/1979  . Conjunctiva disorder 12/26/2010  . Varicose veins of lower extremities 08/16/2009  .  External hemorrhoids without mention of complication 08/26/2012  . Other abnormal blood chemistry 2013    hyperglycemia    Past Surgical History  Procedure Laterality Date  . Tonsillectomy  1940's  . Coronary artery bypass graft  1998    x2; LIMA to LAD; SVG  to diagonal off bypass  . Hemorrhoid surgery  08/26/2012    Dr. Luisa Hart     Procedures: 02/24/2009-Bone Density: No osteopenia is noted in the spine although degenerative changes and scoliosis may be falsely elevating this value. Osteopenia is noted in the left femoral neck area. There has been a 3.4% increase in the spine with a 2.9% decrease in the femoral neck from her prior scan. A vertebral fracture assessment was done which revealed a sedge and mid wedge fracture of T7 and of L1 2010 Colonoscopy 06/27/2010: EKG - NSR, 61 BPM 09/07/10: EKG - AF, 97 BPM 12/13/10 EKG: Sinus bradycardia, 54 BPM 03/28/11 Mammogram: Negative- [facility]   Consultants: Orthopedic-Dr.Chris Raffo Dermatologist-Dr.Robert Irene Limbo Endocrinologist-Dr.Michael Bolognese GYN-Dr.Joyce Brody Cardiologist-Dr.Robert Gold Eye-Dr.Arnaud Scafidi Urologist-Dr.Robert Sher Dentist-Dr.Jeffrey Sullivan Lone   Current Outpatient Prescriptions  Medication Sig Dispense Refill  . aspirin 81 MG tablet Take 81 mg by mouth daily. Take 1 tablet daily to help prevent stroke and heart attack.      Marland Kitchen atenolol (TENORMIN) 25 MG tablet Take 12.5 mg by mouth daily. Take 1/2 tablet daily to treat high blood pressure and angina.      . calcium carbonate (OS-CAL) 600 MG TABS Take 600 mg by mouth. Take one twice      . cholecalciferol (VITAMIN D) 1000 UNITS tablet Take 1,000 Units by mouth daily. Take 1 tablet daily for Vitamin D supplement.      . dabigatran (PRADAXA) 150 MG CAPS Take 150 mg by mouth 2 (two) times daily. Take 1 tablet twice daily for anticoagulation.      . digoxin (LANOXIN) 0.125 MG tablet Take one tablet every other day to control heart rhythm  90 tablet  3  . HYDROcodone-acetaminophen (VICODIN ES) 7.5-750 MG per tablet Take 1 tablet by mouth every 4 (four) hours as needed for pain.       Marland Kitchen loratadine (CLARITIN) 10 MG tablet Take 10 mg by mouth daily as needed for allergies.       Marland Kitchen LORazepam (ATIVAN) 1 MG tablet Take one  tablet at bedtime to decrease anxiety and help with sleep  90 tablet  3  . pravastatin (PRAVACHOL) 20 MG tablet Take 20 mg by mouth daily. Take 1 tablet daily to lower cholesterol.       No current facility-administered medications for this visit.    Immunization History  Administered Date(s) Administered  . Influenza Whole 08/06/2012  . Pneumococcal Conjugate 11/07/2007  . Td 11/07/2007  . Zoster 03/12/2008     Diet: unrestricted   History  Substance Use Topics  . Smoking status: Never Smoker   . Smokeless tobacco: Never Used  . Alcohol Use: Yes     Comment: occasional  Social History: Marital History: Married since 1955 Housing: Lives in a single level home that [facility] since July 2002. Moved from [place] at that time to be closer to family. 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 Tobacco Use: Never smoked Alcohol: moderate quantities Caffeine: Moderate amount of beverages per day Exercises: Predominantly walking Diet: Unrestricted Pets in Home: None     Family History  Problem Relation  Age of Onset  . Heart attack Father   . Heart disease Father   . Hypertension Sister   . Heart disease Sister   . Heart disease Brother    FATHER:    The father is deceased.  The cause of death was myocardial infarction.  Death occurred at age 46. MOTHER:   The mother is deceased.  The cause of death was natural causes.  Death occurred at age 61. SIBLINGS:   1)  The patient's sister is living.  Illnesses:  Hypertension, heart disease.Zeta  80  2)  The patient's brother is living.  Illnesses:  Heart disease.Jomarie Longs  72  CHILDREN:   1)  The patient's daughter is living.Marylu Lund age 63 ,lives in [place]  Review of Systems  Constitutional: Negative.   HENT: Negative.   Eyes: Negative for photophobia, pain, itching and visual disturbance.       Recent right  suconjunctival hemorrhage  Respiratory: Negative for apnea, cough, chest tightness and shortness of breath.   Cardiovascular: Negative for chest pain, palpitations and leg swelling.  Gastrointestinal: Negative.   Endocrine: Negative for cold intolerance, heat intolerance, polydipsia, polyphagia and polyuria.       Elevated glucose at times.  Genitourinary: Negative.  Negative for dysuria and enuresis.  Musculoskeletal:       Occasional back pain for which she uses hydrocodone a few times each month.  Skin:       Scaly patch at the right cheek  Allergic/Immunologic: Negative.   Neurological: Negative.   Hematological: Negative.   Psychiatric/Behavioral: Negative.      Vital signs: BP 120/80  Pulse 84  Temp(Src) 96.8 F (36 C) (Oral)  Ht 5' 6.5" (1.689 m)  Wt 138 lb (62.596 kg)  BMI 21.94 kg/m2  Physical Exam  Constitutional: She is oriented to person, place, and time. She appears well-developed.  HENT:  Head: Normocephalic and atraumatic.  Right Ear: External ear normal.  Left Ear: External ear normal.  Nose: Nose normal.  Eyes: Conjunctivae and EOM are normal. Pupils are equal, round, and reactive to light.  Right suconjunctival hemorrhage.  Neck: Neck supple. No JVD present. No tracheal deviation present. No thyromegaly present.  Cardiovascular: Normal rate and intact distal pulses.  Exam reveals no gallop and no friction rub.   No murmur heard. Atrial fibrillation. Superficial varicosities bilaterally.  Respiratory: Effort normal and breath sounds normal. No respiratory distress. She has no wheezes. She has no rales.  GI: She exhibits no distension and no mass. There is no tenderness.  Genitourinary:  Not done today. Previously normal in 2012  Musculoskeletal: Normal range of motion. She exhibits no edema and no tenderness.  Bunion of the right foot. Right fifth finger has distal deviation at the DIP joint  Lymphadenopathy:    She has no cervical adenopathy.   Neurological: She is alert and oriented to person, place, and time. She has normal reflexes. No cranial nerve deficit.  Skin: No rash noted. No erythema. No pallor.  Small scaly patch on the right cheek which appears benign.  Psychiatric: She has a normal mood and affect. Her behavior is normal. Judgment and thought content normal.   LABS REVIEWED 12/14/2009 Sed Rate 10 CK3.0 Vitamin B-12  546, Folate 15.8 06/14/2010 CBC: WBC 5.7, RBC 4.25, Hgb 13.1, Platelets 144 Vitamin D: 49.5 TSH: 3.360 CMP: Glucose 95, BUN 18, Creatinine 1.1, Na 139, K +4.0 Lipid: Cholesterol 140, Triglycerides 62, HDL 67, VLDL 12, LDL 61 10/27/10: WBC 8.8, Hgb. 12.9, Hct 39.3,  Plt 156 INR 2.0 02/08/11: INR 2.0 03/08/11: INR 2.3   5/30 INR 2.2 05/01/11; INR 1.9 05/31/11 INR 2.7 06/22/11 WBC 6.7, Hgb 13.8, Hct 41.6, Plt 167 Glu 85, BUN 21, Cr .88, Na 137, K+ 4.3 protein/LFTs WNL TC 128, TG 55, HDL 55, LDL 62  TSH 5.635 02/13/80 INR 2.9  04/22/2013 CMP: Normal   Lipids: TC 164, trig 53, HDL 54, LDL 99   TSH 6.043  Screening Score  MMS    PHQ2 0  PHQ9     Fall Risk    BIMS    Annual summary: Hospitalizations: None in the last year Infection History: None of significance Functional assessment: Independent in all ADL Areas of potential improvement: Functioning at highest achievable level Rehabilitation Potential: No rehabilitation needed Prognosis for survival: Good  Plan: Meralgia paresthetica, right patient was educated in the etiology of her right leg discomfort. She is reassured this is a benign condition.  Unspecified essential hypertension: Controlled  Hemorrhoids: Asymptomatic. Previous sclerotherapy.  Atrial fibrillation: Controlled rate  Other abnormal blood chemistry: History of elevated glucose, most recently normal  Pain in joint, shoulder region, left: Mild to moderate discomfort with elevation and abduction  Cervicalgia: Improved

## 2013-06-21 NOTE — Patient Instructions (Signed)
Continue current medications. 

## 2013-07-17 ENCOUNTER — Telehealth: Payer: Self-pay | Admitting: *Deleted

## 2013-07-17 NOTE — Telephone Encounter (Signed)
Patient called and wants to know if you will change her Pravastatin to Simvastatin because it is cheaper through her insurance company. Please Advise.

## 2013-07-18 ENCOUNTER — Other Ambulatory Visit: Payer: Self-pay | Admitting: *Deleted

## 2013-07-18 MED ORDER — SIMVASTATIN 20 MG PO TABS: 20.0000 mg | ORAL_TABLET | Freq: Every day | ORAL | Status: DC

## 2013-07-18 NOTE — Telephone Encounter (Signed)
Ok. Provide script for simvastatin 20 mg daily

## 2013-07-18 NOTE — Telephone Encounter (Signed)
Patient Notified and faxed new Rx into pharmacy.

## 2013-08-18 ENCOUNTER — Other Ambulatory Visit: Payer: Self-pay | Admitting: Internal Medicine

## 2013-08-20 ENCOUNTER — Other Ambulatory Visit: Payer: Self-pay | Admitting: *Deleted

## 2013-08-26 DIAGNOSIS — Z23 Encounter for immunization: Secondary | ICD-10-CM | POA: Diagnosis not present

## 2013-09-17 ENCOUNTER — Other Ambulatory Visit: Payer: Self-pay | Admitting: Geriatric Medicine

## 2013-09-17 ENCOUNTER — Other Ambulatory Visit: Payer: Self-pay | Admitting: Internal Medicine

## 2013-10-27 ENCOUNTER — Non-Acute Institutional Stay: Payer: Medicare Other | Admitting: Internal Medicine

## 2013-10-27 ENCOUNTER — Encounter: Payer: Self-pay | Admitting: Internal Medicine

## 2013-10-27 VITALS — BP 130/82 | HR 87 | Resp 10 | Wt 135.2 lb

## 2013-10-27 DIAGNOSIS — M7710 Lateral epicondylitis, unspecified elbow: Secondary | ICD-10-CM

## 2013-10-27 DIAGNOSIS — R252 Cramp and spasm: Secondary | ICD-10-CM

## 2013-10-27 DIAGNOSIS — R7989 Other specified abnormal findings of blood chemistry: Secondary | ICD-10-CM | POA: Diagnosis not present

## 2013-10-27 DIAGNOSIS — I4891 Unspecified atrial fibrillation: Secondary | ICD-10-CM

## 2013-10-27 DIAGNOSIS — M25579 Pain in unspecified ankle and joints of unspecified foot: Secondary | ICD-10-CM

## 2013-10-27 DIAGNOSIS — E785 Hyperlipidemia, unspecified: Secondary | ICD-10-CM

## 2013-10-27 DIAGNOSIS — I10 Essential (primary) hypertension: Secondary | ICD-10-CM | POA: Diagnosis not present

## 2013-10-27 DIAGNOSIS — M25519 Pain in unspecified shoulder: Secondary | ICD-10-CM

## 2013-10-27 DIAGNOSIS — M542 Cervicalgia: Secondary | ICD-10-CM

## 2013-10-27 DIAGNOSIS — B354 Tinea corporis: Secondary | ICD-10-CM

## 2013-10-27 HISTORY — DX: Pain in unspecified ankle and joints of unspecified foot: M25.579

## 2013-10-27 HISTORY — DX: Cramp and spasm: R25.2

## 2013-10-27 MED ORDER — NYSTATIN-TRIAMCINOLONE 100000-0.1 UNIT/GM-% EX CREA: TOPICAL_CREAM | CUTANEOUS | Status: DC

## 2013-10-27 NOTE — Progress Notes (Signed)
Patient ID: Susan Conway, female   DOB: 01-21-1929, 79 y.o.   MRN: [id number]    Location:  [facility] [employer] [facility] Service: Clinic (12)    Allergies  Allergen Reactions  . Iodinated Diagnostic Agents   . Sulfamethoxazole-Trimethoprim   . Bactrim Rash  . Relafen [Nabumetone] Rash    Chief Complaint  Patient presents with  . Medical Managment of Chronic Issues    6 month follow-up     HPI:  Atrial fibrillation: chronic  Unspecified essential hypertension: controlled  DYSLIPIDEMIA: controlled  Other abnormal blood chemistry: elevated glucose in the past  Pain in joint, shoulder region, unspecified laterality  Cervicalgia  Leg cramp  Tennis elbow, unspecified laterality  Pain in joint, ankle and foot, unspecified laterality: sometimes uses hydrocodone. Seems to be getting worse.  Onychogryphosis of the left great toenail: painless.  History of removal of a lesion from the right neck. Now has a wax pearl at the skin surface.   Medications: Patient's Medications  New Prescriptions   No medications on file  Previous Medications   ASPIRIN 81 MG TABLET    Take 81 mg by mouth daily. Take 1 tablet daily to help prevent stroke and heart attack.   ATENOLOL (TENORMIN) 25 MG TABLET    Take 12.5 mg by mouth daily. Take 1/2 tablet daily to treat high blood pressure and angina.   CALCIUM CARBONATE (OS-CAL) 600 MG TABS    Take 600 mg by mouth. Take one twice   CHOLECALCIFEROL (VITAMIN D) 1000 UNITS TABLET    Take 1,000 Units by mouth daily. Take 1 tablet daily for Vitamin D supplement.   DIGOXIN (LANOXIN) 0.125 MG TABLET    Take one tablet every other day to control heart rhythm   HYDROCODONE-ACETAMINOPHEN (VICODIN ES) 7.5-750 MG PER TABLET    Take 1 tablet by mouth every 4 (four) hours as needed for pain.    LORATADINE (CLARITIN) 10 MG TABLET    Take 10 mg by mouth daily as needed for allergies.    LORAZEPAM (ATIVAN) 1 MG TABLET    Take one tablet at bedtime  to decrease anxiety and help with sleep   PRADAXA 150 MG CAPS CAPSULE    TAKE 1 CAPSULE TWICE DAILY FOR ANTICOAGULATION   SIMVASTATIN (ZOCOR) 20 MG TABLET    Take 1 tablet (20 mg total) by mouth at bedtime. For cholesterol  Modified Medications   No medications on file  Discontinued Medications   No medications on file     Review of Systems  Filed Vitals:   10/27/13 1418  BP: 130/82  Pulse: 87  Resp: 10  Weight: 135 lb 3.2 oz (61.326 kg)  SpO2: 97%   Physical Exam   Labs reviewed:  12/14/2009 Sed Rate 10  CK3.0  Vitamin B-12 546, Folate 15.8  06/14/2010 CBC: WBC 5.7, RBC 4.25, Hgb 13.1, Platelets 144  Vitamin D: 49.5  TSH: 3.360  CMP: Glucose 95, BUN 18, Creatinine 1.1, Na 139, K +4.0  Lipid: Cholesterol 140, Triglycerides 62, HDL 67, VLDL 12, LDL 61  10/27/10: WBC 8.8, Hgb. 12.9, Hct 39.3, Plt 156  INR 2.0  02/08/11: INR 2.0  03/08/11: INR 2.3 5/30 INR 2.2  05/01/11; INR 1.9  05/31/11 INR 2.7  06/22/11 WBC 6.7, Hgb 13.8, Hct 41.6, Plt 167  Glu 85, BUN 21, Cr .88, Na 137, K+ 4.3 protein/LFTs WNL  TC 128, TG 55, HDL 55, LDL 62  TSH 5.635  06/28/11 INR 2.9  04/22/2013  CMP: Normal  Lipids: TC 164, trig 53, HDL 54, LDL 99  TSH 6.043    Assessment/Plan

## 2013-12-08 ENCOUNTER — Other Ambulatory Visit: Payer: Self-pay

## 2013-12-08 DIAGNOSIS — Z1231 Encounter for screening mammogram for malignant neoplasm of breast: Secondary | ICD-10-CM

## 2013-12-24 ENCOUNTER — Ambulatory Visit
Admission: RE | Admit: 2013-12-24 | Discharge: 2013-12-24 | Disposition: A | Discharge status: Home / Self Care | Payer: Medicare Other | Source: Ambulatory Visit

## 2013-12-24 ENCOUNTER — Telehealth: Payer: Self-pay | Admitting: *Deleted

## 2013-12-24 ENCOUNTER — Other Ambulatory Visit: Payer: Self-pay | Admitting: *Deleted

## 2013-12-24 DIAGNOSIS — Z1231 Encounter for screening mammogram for malignant neoplasm of breast: Secondary | ICD-10-CM

## 2013-12-24 LAB — HM MAMMOGRAPHY: HM Mammogram: NEGATIVE

## 2013-12-24 MED ORDER — DABIGATRAN ETEXILATE MESYLATE 150 MG PO CAPS: ORAL_CAPSULE | ORAL | Status: DC

## 2013-12-24 NOTE — Telephone Encounter (Signed)
Patient called and stated that she needed prior auth on her Pradaxa and a 30 day supply called into local pharmacy. Called in Rx into pharmacy and called [facility] # [phone] and spoke with Belinda Block and it was approved 11/24/2013 to 12/24/2014  Case #: [id number]  Called and notified patient.

## 2014-01-13 ENCOUNTER — Ambulatory Visit (INDEPENDENT_AMBULATORY_CARE_PROVIDER_SITE_OTHER): Payer: Medicare Other | Admitting: Internal Medicine

## 2014-01-13 ENCOUNTER — Encounter: Payer: Self-pay | Admitting: Internal Medicine

## 2014-01-13 VITALS — BP 146/87 | HR 89 | Ht 66.5 in | Wt 136.0 lb

## 2014-01-13 DIAGNOSIS — I4891 Unspecified atrial fibrillation: Secondary | ICD-10-CM | POA: Diagnosis not present

## 2014-01-13 NOTE — Assessment & Plan Note (Signed)
Her atrial fibrillation appears to be well rate controlled and she is asymptomatic in that she has no palpitations, syncope or sob. Will continue a strategy of rate control.

## 2014-01-13 NOTE — Progress Notes (Signed)
HPI Susan Conway returns today for followup. She is a very pleasant 80 year old woman with coronary artery disease status post bypass surgery, chronic atrial fibrillation, and dyslipidemia. In the interim, she has done well. She denies chest pain, shortness of breath, or peripheral edema. No syncope. She remains active walking albeit at a reduce frequency because her husband has been ill. Allergies  Allergen Reactions  . Iodinated Diagnostic Agents   . Sulfamethoxazole-Trimethoprim   . Bactrim Rash  . Relafen [Nabumetone] Rash     Current Outpatient Prescriptions  Medication Sig Dispense Refill  . aspirin 81 MG tablet Take 81 mg by mouth daily. Take 1 tablet daily to help prevent stroke and heart attack.      Marland Kitchen atenolol (TENORMIN) 25 MG tablet Take 12.5 mg by mouth daily. Take 1/2 tablet daily to treat high blood pressure and angina.      . calcium carbonate (OS-CAL) 600 MG TABS Take 600 mg by mouth 2 (two) times daily with a meal.       . cholecalciferol (VITAMIN D) 1000 UNITS tablet Take 1,000 Units by mouth daily. Take 1 tablet daily for Vitamin D supplement.      . dabigatran (PRADAXA) 150 MG CAPS capsule Take one capsule twice daily for anticoagulation  60 capsule  0  . digoxin (LANOXIN) 0.125 MG tablet Take one tablet every other day to control heart rhythm  90 tablet  3  . HYDROcodone-acetaminophen (VICODIN ES) 7.5-750 MG per tablet Take 1 tablet by mouth every 4 (four) hours as needed for pain.       Marland Kitchen loratadine (CLARITIN) 10 MG tablet Take 10 mg by mouth daily as needed for allergies.       Marland Kitchen LORazepam (ATIVAN) 1 MG tablet Take one tablet at bedtime to decrease anxiety and help with sleep  90 tablet  3  . nystatin-triamcinolone (MYCOLOG II) cream Apply twice daily to lesion on the left buttock  30 g  0  . simvastatin (ZOCOR) 20 MG tablet Take 1 tablet (20 mg total) by mouth at bedtime. For cholesterol  90 tablet  3   No current facility-administered medications for this visit.      Past Medical History  Diagnosis Date  . PVC's (premature ventricular contractions)   . MI, old   . Osteoporosis   . Pain in joint, shoulder region 08/12/2012  . Other disorder of muscle, ligament, and fascia 04/08/2012  . Other abnormal blood chemistry 04/08/2012  . Degeneration of lumbar or lumbosacral intervertebral disc 01/31/2012  . Cervicalgia 01/31/2012  . Pain in thoracic spine 01/31/2012  . 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 Myalgia and myositis, unspecified 01/17/2012  . Dizziness and giddiness 11/08/2011    vertigo  . Rash and other nonspecific skin eruption 08/02/2011  . Unspecified hypothyroidism 07/03/2011  . Pain in limb 01/11/2011  . Pain in joint, upper arm 12/26/2010  . Atrial fibrillation 09/21/2010  . Long term (current) use of anticoagulants 09/2010  . Other specified cardiac dysrhythmias(427.89) 06/27/2010  . External hemorrhoids without mention of complication 3/87/5643  . Varicose veins of lower extremities 08/16/2009  . Ganglion of tendon sheath 08/16/2009  . Cramp of limb 08/16/2009  . Disturbance of skin sensation 08/2009  . Lumbago 07/2009  . Meralgia paresthetica 07/2007  . Closed fracture of lumbar vertebra without mention of spinal cord injury 07/17/2005  . Unspecified essential hypertension 07/17/1997  . Coronary atherosclerosis of native coronary artery 07/17/1997  . Senile osteoporosis 07/17/1993  . Bell's palsy  07/18/1979  . Conjunctiva disorder 12/26/2010  . Varicose veins of lower extremities 08/16/2009  . External hemorrhoids without mention of complication 32/20/2542  . Other abnormal blood chemistry 2013    hyperglycemia    ROS:   All systems reviewed and negative except as noted in the HPI.   Past Surgical History  Procedure Laterality Date  . Tonsillectomy  1940's  . Coronary artery bypass graft  1998    x2; LIMA to LAD; SVG to diagonal off bypass  . Hemorrhoid surgery  08/26/2012    Dr. Brantley Stage     Family History  Problem  Relation Age of Onset  . Heart attack Father   . Heart disease Father   . Hypertension Sister   . Heart disease Sister   . Heart disease Brother      History   Social History  . Marital Status: Married    Spouse Name: N/A    Number of Children: N/A  . Years of Education: N/A   Occupational History  . Retired    Social History Main Topics  . Smoking status: Never Smoker   . Smokeless tobacco: Never Used  . Alcohol Use: Yes     Comment: occasional  . Drug Use: No  . Sexual Activity: Not on file   Other Topics Concern  . Not on file   Social History Narrative   Living at [employer].     BP 146/87  Pulse 89  Ht 5' 6.5" (1.689 m)  Wt 136 lb (61.689 kg)  BMI 21.62 kg/m2  Physical Exam:  Well appearing elderly woman, NAD HEENT: Unremarkable Neck:  6 cm JVD, no thyromegally Lungs:  Clear with no wheezes, rales, or rhonchi. HEART:  IRegular rate rhythm, no murmurs, no rubs, no clicks Abd:  soft, positive bowel sounds, no organomegally, no rebound, no guarding Ext:  2 plus pulses, no edema, no cyanosis, no clubbing Skin:  No rashes no nodules Neuro:  CN II through XII intact, motor grossly intact  EKG - atrial fibrillation with a controlled ventricular response.   Assess/Plan:

## 2014-01-13 NOTE — Patient Instructions (Signed)
Your physician wants you to follow-up in: 12 months with Dr. Taylor. You will receive a reminder letter in the mail two months in advance. If you don't receive a letter, please call our office to schedule the follow-up appointment.    

## 2014-01-26 ENCOUNTER — Other Ambulatory Visit: Payer: Self-pay | Admitting: *Deleted

## 2014-01-26 DIAGNOSIS — I4891 Unspecified atrial fibrillation: Secondary | ICD-10-CM

## 2014-01-26 MED ORDER — DIGOXIN 125 MCG PO TABS: ORAL_TABLET | ORAL | Status: DC

## 2014-01-28 ENCOUNTER — Other Ambulatory Visit: Payer: Self-pay | Admitting: *Deleted

## 2014-01-28 DIAGNOSIS — I4891 Unspecified atrial fibrillation: Secondary | ICD-10-CM

## 2014-01-28 MED ORDER — DIGOXIN 125 MCG PO TABS: ORAL_TABLET | ORAL | Status: DC

## 2014-01-28 NOTE — Telephone Encounter (Signed)
[facility]

## 2014-01-29 DIAGNOSIS — D485 Neoplasm of uncertain behavior of skin: Secondary | ICD-10-CM | POA: Diagnosis not present

## 2014-01-29 DIAGNOSIS — C4441 Basal cell carcinoma of skin of scalp and neck: Secondary | ICD-10-CM | POA: Diagnosis not present

## 2014-01-29 DIAGNOSIS — Z85828 Personal history of other malignant neoplasm of skin: Secondary | ICD-10-CM | POA: Diagnosis not present

## 2014-01-29 DIAGNOSIS — L821 Other seborrheic keratosis: Secondary | ICD-10-CM | POA: Diagnosis not present

## 2014-01-29 HISTORY — PX: SKIN BIOPSY: SHX1

## 2014-01-30 ENCOUNTER — Other Ambulatory Visit: Payer: Self-pay | Admitting: *Deleted

## 2014-01-30 DIAGNOSIS — I4891 Unspecified atrial fibrillation: Secondary | ICD-10-CM

## 2014-01-30 MED ORDER — DIGOXIN 125 MCG PO TABS: ORAL_TABLET | ORAL | Status: DC

## 2014-01-30 NOTE — Telephone Encounter (Signed)
[facility]

## 2014-04-21 DIAGNOSIS — E785 Hyperlipidemia, unspecified: Secondary | ICD-10-CM | POA: Diagnosis not present

## 2014-04-21 DIAGNOSIS — Z0000 Encounter for general adult medical examination without abnormal findings: Secondary | ICD-10-CM | POA: Diagnosis not present

## 2014-04-21 DIAGNOSIS — I4891 Unspecified atrial fibrillation: Secondary | ICD-10-CM | POA: Diagnosis not present

## 2014-04-21 DIAGNOSIS — I10 Essential (primary) hypertension: Secondary | ICD-10-CM | POA: Diagnosis not present

## 2014-04-21 LAB — LIPID PANEL
CHOLESTEROL: 141 mg/dL (ref 0–200)
HDL: 61 mg/dL (ref 35–70)
LDL Cholesterol: 71 mg/dL
LDL/HDL RATIO: 1.2
Triglycerides: 50 mg/dL (ref 40–160)

## 2014-04-21 LAB — HEPATIC FUNCTION PANEL
ALT: 12 U/L (ref 7–35)
AST: 18 U/L (ref 13–35)
Alkaline Phosphatase: 57 U/L (ref 25–125)
Bilirubin, Total: 0.7 mg/dL

## 2014-04-21 LAB — TSH: TSH: 3.37 u[IU]/mL (ref 0.41–5.90)

## 2014-04-21 LAB — BASIC METABOLIC PANEL
BUN: 21 mg/dL (ref 4–21)
Creatinine: 0.9 mg/dL (ref 0.5–1.1)
Glucose: 86 mg/dL
Potassium: 3.8 mmol/L (ref 3.4–5.3)
Sodium: 142 mmol/L (ref 137–147)

## 2014-04-27 ENCOUNTER — Encounter: Payer: Self-pay | Admitting: Internal Medicine

## 2014-05-04 ENCOUNTER — Encounter: Payer: Self-pay | Admitting: Internal Medicine

## 2014-05-25 ENCOUNTER — Non-Acute Institutional Stay: Payer: Medicare Other | Admitting: Internal Medicine

## 2014-05-25 ENCOUNTER — Other Ambulatory Visit: Payer: Self-pay | Admitting: Nurse Practitioner

## 2014-05-25 ENCOUNTER — Encounter: Payer: Self-pay | Admitting: Internal Medicine

## 2014-05-25 VITALS — BP 140/82 | HR 88 | Temp 98.2°F | Ht 65.5 in | Wt 134.0 lb

## 2014-05-25 DIAGNOSIS — I10 Essential (primary) hypertension: Secondary | ICD-10-CM

## 2014-05-25 DIAGNOSIS — I4891 Unspecified atrial fibrillation: Secondary | ICD-10-CM | POA: Diagnosis not present

## 2014-05-25 DIAGNOSIS — R7309 Other abnormal glucose: Secondary | ICD-10-CM | POA: Diagnosis not present

## 2014-05-25 DIAGNOSIS — E785 Hyperlipidemia, unspecified: Secondary | ICD-10-CM

## 2014-05-25 DIAGNOSIS — L570 Actinic keratosis: Secondary | ICD-10-CM

## 2014-05-25 DIAGNOSIS — I252 Old myocardial infarction: Secondary | ICD-10-CM | POA: Diagnosis not present

## 2014-05-25 DIAGNOSIS — I2510 Atherosclerotic heart disease of native coronary artery without angina pectoris: Secondary | ICD-10-CM | POA: Diagnosis not present

## 2014-05-25 DIAGNOSIS — C4491 Basal cell carcinoma of skin, unspecified: Secondary | ICD-10-CM

## 2014-05-25 DIAGNOSIS — M7710 Lateral epicondylitis, unspecified elbow: Secondary | ICD-10-CM

## 2014-05-25 DIAGNOSIS — R739 Hyperglycemia, unspecified: Secondary | ICD-10-CM

## 2014-05-25 DIAGNOSIS — I482 Chronic atrial fibrillation, unspecified: Secondary | ICD-10-CM

## 2014-05-25 HISTORY — DX: Basal cell carcinoma of skin, unspecified: C44.91

## 2014-05-25 HISTORY — DX: Actinic keratosis: L57.0

## 2014-05-25 MED ORDER — HYDROCODONE-ACETAMINOPHEN 7.5-750 MG PO TABS: ORAL_TABLET | ORAL | Status: DC

## 2014-05-25 NOTE — Progress Notes (Signed)
Patient ID: Susan Conway, female   DOB: 17-Jan-1929, 80 y.o.   MRN: [id number]    Location:  [facility] (12)  Extended Emergency Contact Information Primary Emergency Contact: Jungman,Charles Address: [place], [place] Phone: [phone] Relation: Spouse   Chief Complaint  Patient presents with  . Medical Management of Chronic Issues    Comprehensive exam: blood pressure, A-Fib, thyroid, cholesterol    HPI:  Continues with pain in the lack. Started in 2006. Has been on Vicodin, which she uses sparingly. She requests a refill.  Has left tennis elbow. She has relapsing discomfort. Does not usually need Vicodin. Has not used OTC arthritis creams.  Actinic keratosis above the right upper eyebrow.  Pain in the right arm and shoulder. Intermittent.  Chronic atrial fibrillation: rate cointrolled  Unspecified essential hypertension: controlled  Hyperglycemia: normal glucose on last lab  DYSLIPIDEMIA: controlled  Tennis elbow, unspecified laterality: intermittent pain left elbow. No disabling.  Atherosclerosis of native coronary artery of native heart without angina pectoris: stable  Actinic keratosis: above the right upper eyebrow  Basal cell carcinoma: multiple removed in march 2015 on both sides of the neck and the scalp    Past Medical History  Diagnosis Date  . PVC's (premature ventricular contractions)   . MI, old   . Osteoporosis   . Pain in joint, shoulder region 08/12/2012  . Other disorder of muscle, ligament, and fascia 04/08/2012  . Other abnormal blood chemistry 04/08/2012  . Degeneration of lumbar or lumbosacral intervertebral disc 01/31/2012  . Cervicalgia 01/31/2012  . Pain in thoracic spine 01/31/2012  . 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 Myalgia and myositis, unspecified 01/17/2012  . Dizziness and giddiness 11/08/2011    vertigo  . Rash and other  nonspecific skin eruption 08/02/2011  . Unspecified hypothyroidism 07/03/2011  . Pain in limb 01/11/2011  . Pain in joint, upper arm 12/26/2010  . Atrial fibrillation 09/21/2010  . Long term (current) use of anticoagulants 09/2010  . Other specified cardiac dysrhythmias(427.89) 06/27/2010  . External hemorrhoids without mention of complication 3/76/2831  . Varicose veins of lower extremities 08/16/2009  . Ganglion of tendon sheath 08/16/2009  . Cramp of limb 08/16/2009  . Disturbance of skin sensation 08/2009  . Lumbago 07/2009  . Meralgia paresthetica 07/2007  . Closed fracture of lumbar vertebra without mention of spinal cord injury 07/17/2005  . Unspecified essential hypertension 07/17/1997  . Coronary atherosclerosis of native coronary artery 07/17/1997  . Senile osteoporosis 07/17/1993  . Bell's palsy 07/18/1979  . Conjunctiva disorder 12/26/2010  . Varicose veins of lower extremities 08/16/2009  . External hemorrhoids without mention of complication 51/76/1607  . Other abnormal blood chemistry 2013    hyperglycemia  . Basal cell carcinoma 05/25/2014    Multiple removed by Dr. Danella Sensing in March 2015: right neck, left neck, scalp   . Actinic keratosis 05/25/2014  . Pain in joint, ankle and foot 10/27/2013    Bilateral since 1998   . Leg cramp 10/27/2013    Most frequently the right leg.     Past Surgical History  Procedure Laterality Date  . Tonsillectomy  1940's  . Coronary artery bypass graft  1998    x2; LIMA to LAD; SVG to diagonal off bypass  . Hemorrhoid surgery  08/26/2012    Dr. Brantley Stage  . Skin biopsy  01/29/14    (  R) neck; (R) scalp, 2 (L) neck; shave biopsy Superficial basal cell carcinoma Dr. Danella Sensing   Procedures 02/24/2009-Bone Density: No osteopenia is noted in the spine although degenerative changes and scoliosis may be falsely elevating this value. Osteopenia is noted in the left femoral neck area. There has been a 3.4% increase in the spine with a 2.9% decrease  in the femoral neck from her prior scan. A vertebral fracture assessment was done which revealed a sedge and mid wedge fracture of T7 and of L1 2010 Colonoscopy 06/27/2010: EKG - NSR, 61 BPM 09/07/10: EKG - AF, 97 BPM 12/13/10 EKG: Sinus bradycardia, 54 BPM 03/28/11 Mammogram: [facility]- [facility] 06/28/11 Bone density; no Osteoporosis   Consultants  Orthopedic-Dr.Chris Raffo Dermatologist-Dr.Robert Sarajane Jews Endocrinologist-Dr.Michael Bolognese GYN-Dr.Joyce Maurene Capes Cardiologist-Dr. Crissie Sickles Eye-Dr.Arnaud Scafidi Urologist-Dr.Robert Sher Dentist-Dr.Jeffrey Rosanna Randy   History   Social History  . Marital Status: Married    Spouse Name: N/A    Number of Children: N/A  . Years of Education: N/A   Occupational History  . Retired Pharmacist, hospital    Social History Main Topics  . Smoking status: Never Smoker   . Smokeless tobacco: Never Used  . Alcohol Use: Yes     Comment: occasional wine  . Drug Use: No  . Sexual Activity: Not on file   Other Topics Concern  . Not on file   Social History Narrative   Living at [place] since 2010   Married - Charles   Never smoked   Alcohol occasional wine   Exercise walk not daily, house work    DNR      reports that she has never smoked. She has never used smokeless tobacco. She reports that she drinks alcohol. She reports that she does not use illicit drugs.  Immunization History  Administered Date(s) Administered  . Influenza Whole 08/06/2012  . Influenza-Unspecified 08/06/2013  . Pneumococcal Conjugate-13 11/07/2007  . Td 11/07/2007  . Zoster 03/12/2008    Allergies  Allergen Reactions  . Iodinated Diagnostic Agents   . Sulfamethoxazole-Trimethoprim   . Bactrim Rash  . Relafen [Nabumetone] Rash    Medications: Patient's Medications  New Prescriptions   No medications on file  Previous Medications   ASPIRIN 81 MG TABLET    Take 81 mg by mouth daily. Take 1 tablet daily to help prevent stroke and heart attack.    ATENOLOL (TENORMIN) 25 MG TABLET    Take 12.5 mg by mouth daily. Take 1/2 tablet daily to treat high blood pressure and angina.   CALCIUM CARBONATE (OS-CAL) 600 MG TABS    Take 600 mg by mouth 2 (two) times daily with a meal.    CHOLECALCIFEROL (VITAMIN D) 1000 UNITS TABLET    Take 1,000 Units by mouth daily. Take 1 tablet daily for Vitamin D supplement.   DABIGATRAN (PRADAXA) 150 MG CAPS CAPSULE    Take one capsule twice daily for anticoagulation   DIGOXIN (LANOXIN) 0.125 MG TABLET    Take one tablet every other day to control heart rhythm   HYDROCODONE-ACETAMINOPHEN (VICODIN ES) 7.5-750 MG PER TABLET    Take 1 tablet by mouth every 4 (four) hours as needed for pain.    LORATADINE (CLARITIN) 10 MG TABLET    Take 10 mg by mouth daily as needed for allergies.    LORAZEPAM (ATIVAN) 1 MG TABLET    Take one tablet at bedtime to decrease anxiety and help with sleep   NYSTATIN-TRIAMCINOLONE (MYCOLOG II) CREAM    Apply twice daily to  lesion on the left buttock   PRADAXA 150 MG CAPS CAPSULE    TAKE 1 CAPSULE TWICE DAILY FOR ANTICOAGULATION   SIMVASTATIN (ZOCOR) 20 MG TABLET    Take 1 tablet (20 mg total) by mouth at bedtime. For cholesterol  Modified Medications   No medications on file  Discontinued Medications   No medications on file     Review of Systems  Constitutional: Negative.   HENT: Negative.   Eyes: Negative for photophobia, pain, itching and visual disturbance.       Recent right suconjunctival hemorrhage  Respiratory: Negative for apnea, cough, chest tightness and shortness of breath.   Cardiovascular: Negative for chest pain, palpitations and leg swelling.  Gastrointestinal: Negative.   Endocrine: Negative for cold intolerance, heat intolerance, polydipsia, polyphagia and polyuria.       Elevated glucose at times.  Genitourinary: Negative.  Negative for dysuria and enuresis.  Musculoskeletal:       Occasional back pain for which she uses hydrocodone a few times each month. Pain  shoulders and neck.  Skin:       Scaly patch at the right cheek  Allergic/Immunologic: Negative.   Neurological: Negative.   Hematological: Negative.   Psychiatric/Behavioral: Negative.     Filed Vitals:   05/25/14 1531  BP: 140/82  Pulse: 88  Temp: 98.2 F (36.8 C)  TempSrc: Oral  Height: 5' 5.5" (1.664 m)  Weight: 134 lb (60.782 kg)   Body mass index is 21.95 kg/(m^2).  Physical Exam  Constitutional: She is oriented to person, place, and time. She appears well-developed and well-nourished. No distress.  HENT:  Right Ear: External ear normal.  Left Ear: External ear normal.  Nose: Nose normal.  Mouth/Throat: Oropharynx is clear and moist. No oropharyngeal exudate.  Eyes: Conjunctivae and EOM are normal. Pupils are equal, round, and reactive to light.  Corrective lenses.  Neck: No JVD present. No tracheal deviation present. No thyromegaly present.  Cardiovascular: Normal rate and intact distal pulses.  Exam reveals no gallop and no friction rub.   No murmur heard. Chronic AF with controlled rate. Severe varicose veins bilaterally.  Pulmonary/Chest: No respiratory distress. She has no wheezes. She has no rales. She exhibits no tenderness.  Abdominal: She exhibits no distension and no mass. There is no tenderness. There is no rebound and no guarding.  Genitourinary:  Deferred. Normal in 2012  Musculoskeletal: Normal range of motion. She exhibits tenderness (left elbow). She exhibits no edema.  Lymphadenopathy:    She has no cervical adenopathy.  Neurological: She is alert and oriented to person, place, and time. She has normal reflexes. No cranial nerve deficit. Coordination normal.  Skin: No rash noted. No erythema. No pallor.  scaly patch right cheek. AK above the right eyebrow.  Psychiatric: She has a normal mood and affect. Her behavior is normal. Judgment and thought content normal.     Labs reviewed: Nursing Home on 05/25/2014  Component Date Value Ref Range  Status  . [facility]    Final  . HM Dexa Scan 06/28/2011 [facility] Osteoporosis    Final  . Glucose 04/21/2014 86   Final  . BUN 04/21/2014 21  4 - 21 mg/dL Final  . Creatinine 04/21/2014 0.9  0.5 - 1.1 mg/dL Final  . Potassium 04/21/2014 3.8  3.4 - 5.3 mmol/L Final  . Sodium 04/21/2014 142  137 - 147 mmol/L Final  . LDl/HDL Ratio 04/21/2014 1.2   Final  . Triglycerides 04/21/2014  50  40 - 160 mg/dL Final  . Cholesterol 04/21/2014 141  0 - 200 mg/dL Final  . HDL 04/21/2014 61  35 - 70 mg/dL Final  . LDL Cholesterol 04/21/2014 71   Final  . Alkaline Phosphatase 04/21/2014 57  25 - 125 U/L Final  . ALT 04/21/2014 12  7 - 35 U/L Final  . AST 04/21/2014 18  13 - 35 U/L Final  . Bilirubin, Total 04/21/2014 0.7   Final  . TSH 04/21/2014 3.37  0.41 - 5.90 uIU/mL Final      Assessment/Plan 1. Chronic atrial fibrillation Rate controlled  2. Unspecified essential hypertension controlled  3. Hyperglycemia Normal on latest lab  4. DYSLIPIDEMIA controlled  5. Tennis elbow, unspecified laterality Stable. Not interested in referral  6. Atherosclerosis of native coronary artery of native heart without angina pectoris stable  7. Actinic keratosis Try 1% hydrocortisone cream  8. Basal cell carcinoma observe

## 2014-05-25 NOTE — Progress Notes (Deleted)
Patient ID: Susan Conway, female   DOB: 1929/02/09, 80 y.o.   MRN: [id number]    Location:  [facility] (12)    Allergies  Allergen Reactions  . Iodinated Diagnostic Agents   . Sulfamethoxazole-Trimethoprim   . Bactrim Rash  . Relafen [Nabumetone] Rash    Chief Complaint  Patient presents with  . Medical Management of Chronic Issues    Comprehensive exam: blood pressure, A-Fib, thyroid, cholesterol    HPI:  ***  Medications: Patient's Medications  New Prescriptions   No medications on file  Previous Medications   ASPIRIN 81 MG TABLET    Take 81 mg by mouth daily. Take 1 tablet daily to help prevent stroke and heart attack.   ATENOLOL (TENORMIN) 25 MG TABLET    Take 12.5 mg by mouth daily. Take 1/2 tablet daily to treat high blood pressure and angina.   CALCIUM CARBONATE (OS-CAL) 600 MG TABS    Take 600 mg by mouth 2 (two) times daily with a meal.    CHOLECALCIFEROL (VITAMIN D) 1000 UNITS TABLET    Take 1,000 Units by mouth daily. Take 1 tablet daily for Vitamin D supplement.   DABIGATRAN (PRADAXA) 150 MG CAPS CAPSULE    Take one capsule twice daily for anticoagulation   DIGOXIN (LANOXIN) 0.125 MG TABLET    Take one tablet every other day to control heart rhythm   HYDROCODONE-ACETAMINOPHEN (VICODIN ES) 7.5-750 MG PER TABLET    Take 1 tablet by mouth every 4 (four) hours as needed for pain.    LORATADINE (CLARITIN) 10 MG TABLET    Take 10 mg by mouth daily as needed for allergies.    LORAZEPAM (ATIVAN) 1 MG TABLET    Take one tablet at bedtime to decrease anxiety and help with sleep   NYSTATIN-TRIAMCINOLONE (MYCOLOG II) CREAM    Apply twice daily to lesion on the left buttock   PRADAXA 150 MG CAPS CAPSULE    TAKE 1 CAPSULE TWICE DAILY FOR ANTICOAGULATION   SIMVASTATIN (ZOCOR) 20 MG TABLET    Take 1 tablet (20 mg total) by mouth at bedtime. For cholesterol  Modified Medications   No medications on file  Discontinued Medications   No medications on file     Review of Systems  Filed Vitals:   05/25/14 1531  BP: 140/82  Pulse: 88  Temp: 98.2 F (36.8 C)  TempSrc: Oral  Height: 5' 5.5" (1.664 m)  Weight: 134 lb (60.782 kg)   Body mass index is 21.95 kg/(m^2).  Physical Exam   Labs reviewed: Nursing Home on 05/25/2014  Component Date Value Ref Range Status  . [facility]ogram [facility] negative    Final  . HM Dexa Scan 06/28/2011 [facility] Osteoporosis    Final  . Glucose 04/21/2014 86   Final  . BUN 04/21/2014 21  4 - 21 mg/dL Final  . Creatinine 04/21/2014 0.9  0.5 - 1.1 mg/dL Final  . Potassium 04/21/2014 3.8  3.4 - 5.3 mmol/L Final  . Sodium 04/21/2014 142  137 - 147 mmol/L Final  . LDl/HDL Ratio 04/21/2014 1.2   Final  . Triglycerides 04/21/2014 50  40 - 160 mg/dL Final  . Cholesterol 04/21/2014 141  0 - 200 mg/dL Final  . HDL 04/21/2014 61  35 - 70 mg/dL Final  . LDL Cholesterol 04/21/2014 71   Final  . Alkaline Phosphatase 04/21/2014 57  25 - 125 U/L Final  . ALT 04/21/2014  12  7 - 35 U/L Final  . AST 04/21/2014 18  13 - 35 U/L Final  . Bilirubin, Total 04/21/2014 0.7   Final  . TSH 04/21/2014 3.37  0.41 - 5.90 uIU/mL Final      Assessment/Plan

## 2014-06-01 ENCOUNTER — Encounter: Payer: Self-pay | Admitting: Internal Medicine

## 2014-06-22 ENCOUNTER — Other Ambulatory Visit: Payer: Self-pay

## 2014-06-22 MED ORDER — LORAZEPAM 1 MG PO TABS: ORAL_TABLET | ORAL | Status: DC

## 2014-06-22 NOTE — Telephone Encounter (Signed)
Message left on triage: patient needs rx for lorazepam, patient would like rx given to Avel Sensor and to be delivered to [place] on Wednesday.   I spoke with patient to confirm message received and request complete. RX placed in Dr.Green's folder for signature with notation give to [facility]

## 2014-07-09 ENCOUNTER — Other Ambulatory Visit: Payer: Self-pay | Admitting: *Deleted

## 2014-07-09 ENCOUNTER — Telehealth: Payer: Self-pay | Admitting: *Deleted

## 2014-07-09 MED ORDER — HYDROCODONE-ACETAMINOPHEN 7.5-750 MG PO TABS: ORAL_TABLET | ORAL | Status: DC

## 2014-07-09 NOTE — Telephone Encounter (Signed)
Patient called and left message wanting her Hydrocodone Rx. Tried returning patients call but no answer at 4:34pm

## 2014-07-09 NOTE — Telephone Encounter (Signed)
Patient requested and wants Susan Conway to bring Hard Copy to [facility] next Wednesday and patient will pick up there at [facility]

## 2014-07-09 NOTE — Telephone Encounter (Signed)
Spoke with Patient and she wants Rx Printed and sent with Jackelyn Poling out to [facility] next Wednesday and she will pick it up from [place] then. Printed, Dr. Mariea Clonts signed and left for Debbie.

## 2014-07-24 ENCOUNTER — Other Ambulatory Visit: Payer: Self-pay | Admitting: Geriatric Medicine

## 2014-08-19 DIAGNOSIS — Z23 Encounter for immunization: Secondary | ICD-10-CM | POA: Diagnosis not present

## 2014-08-26 DIAGNOSIS — H2513 Age-related nuclear cataract, bilateral: Secondary | ICD-10-CM | POA: Diagnosis not present

## 2014-08-26 DIAGNOSIS — H5203 Hypermetropia, bilateral: Secondary | ICD-10-CM | POA: Diagnosis not present

## 2014-11-24 ENCOUNTER — Encounter: Payer: Medicare Other | Admitting: Internal Medicine

## 2014-11-30 ENCOUNTER — Encounter: Payer: Self-pay | Admitting: Internal Medicine

## 2014-11-30 ENCOUNTER — Ambulatory Visit
Admission: RE | Admit: 2014-11-30 | Discharge: 2014-11-30 | Disposition: A | Discharge status: Home / Self Care | Payer: Medicare Other | Source: Ambulatory Visit | Attending: Internal Medicine | Admitting: Internal Medicine

## 2014-11-30 ENCOUNTER — Telehealth: Payer: Self-pay

## 2014-11-30 DIAGNOSIS — M25512 Pain in left shoulder: Secondary | ICD-10-CM | POA: Diagnosis not present

## 2014-11-30 DIAGNOSIS — M25522 Pain in left elbow: Secondary | ICD-10-CM

## 2014-11-30 DIAGNOSIS — M7989 Other specified soft tissue disorders: Secondary | ICD-10-CM | POA: Diagnosis not present

## 2014-11-30 IMAGING — CR DG HUMERUS 2V *L*
2 series · 2 of 2 positions shown · non-contrast
Comparison: None.

CLINICAL DATA: Chronic swelling and pain.

EXAM:
LEFT HUMERUS - 2+ VIEW

[w humerus ap left]
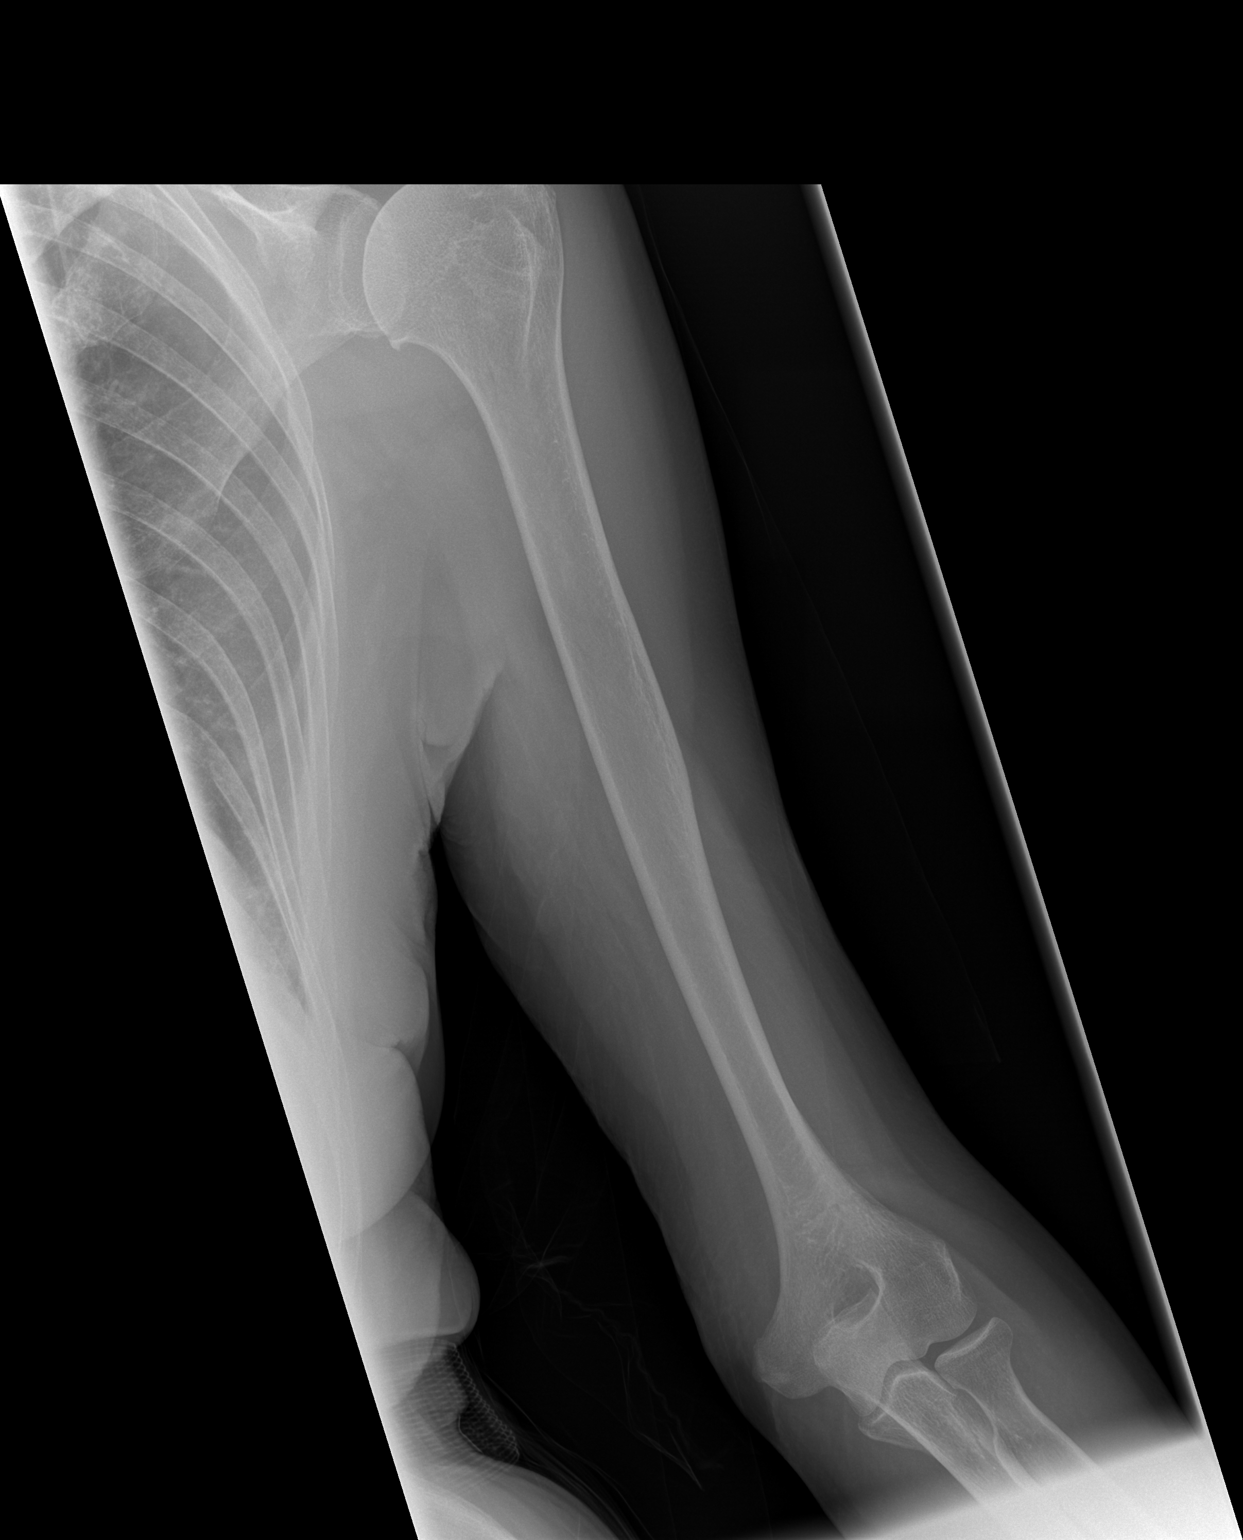

[w humerus lat left]
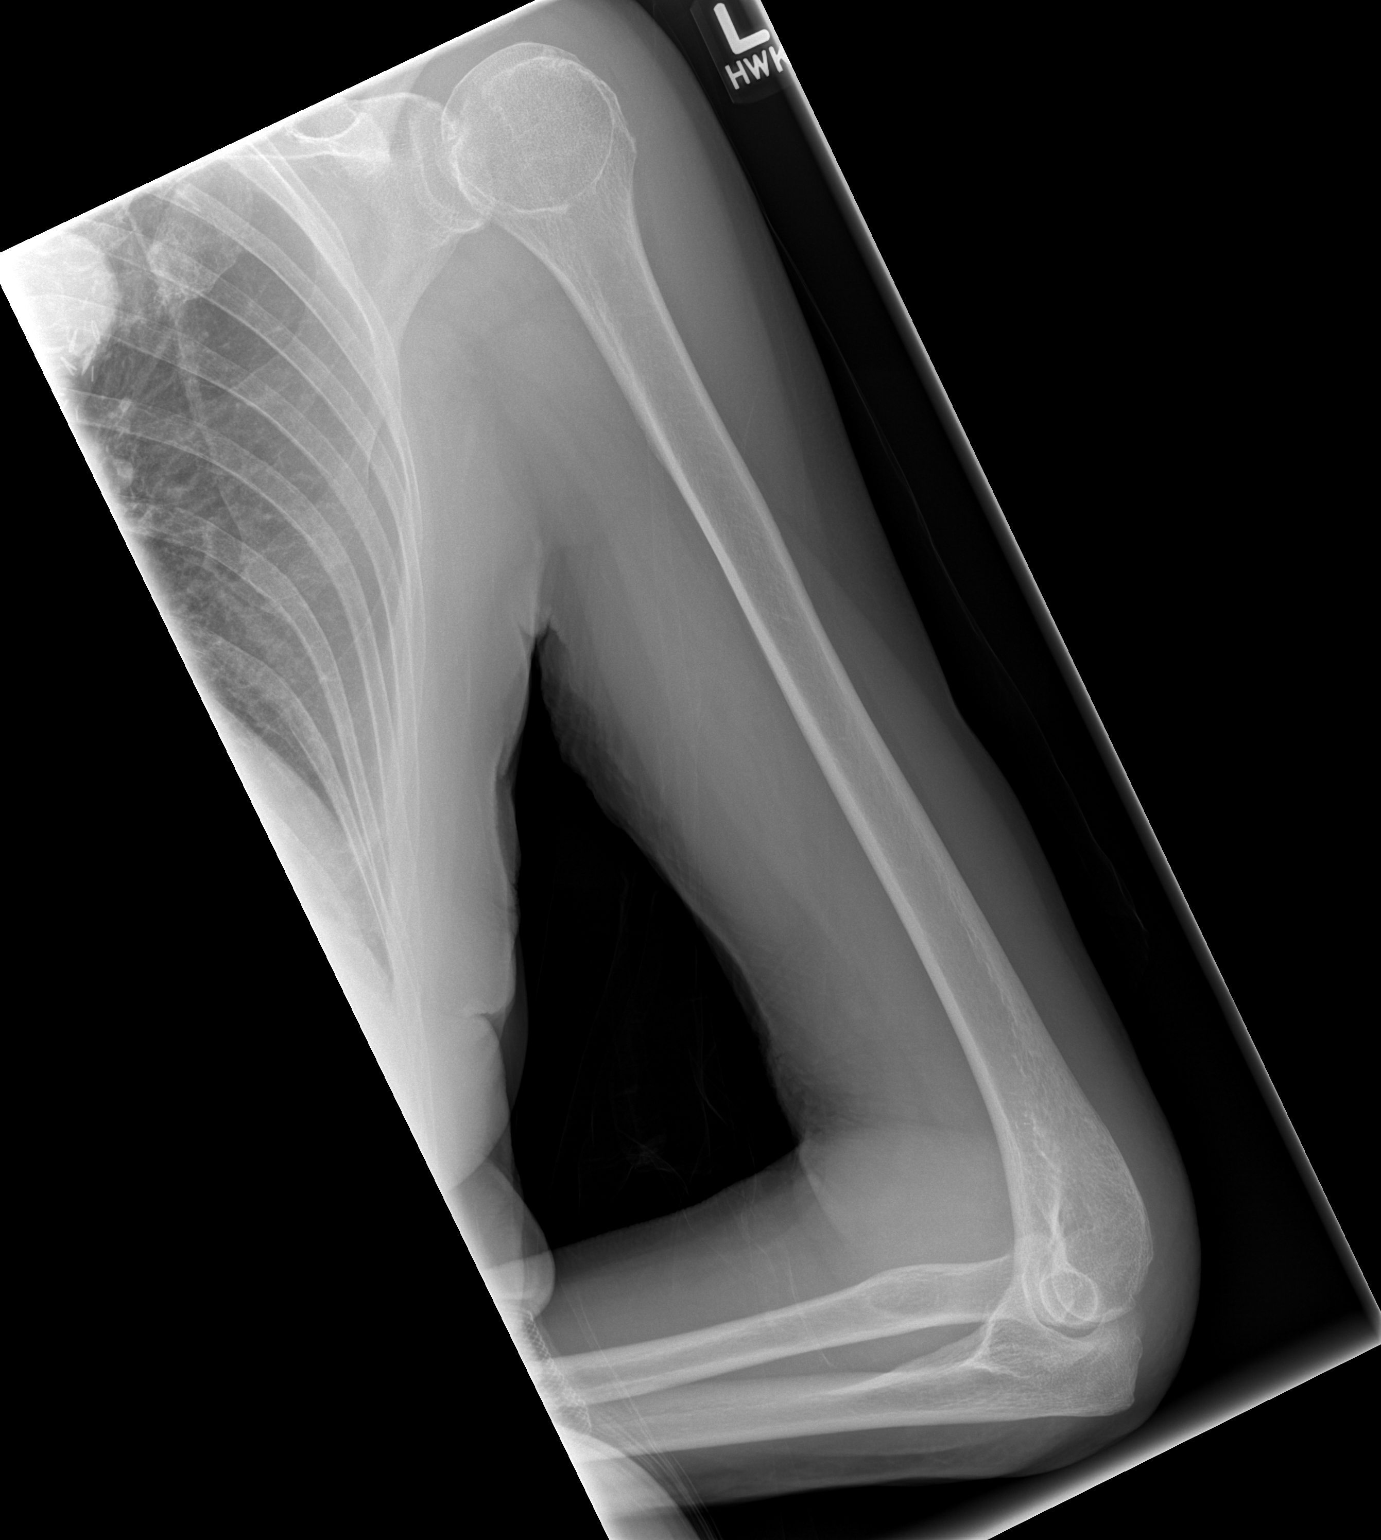

[2 of 2 positions shown; findings below may reference images not displayed]

FINDINGS: No acute soft tissue or bony abnormality identified . No focal
abnormality appear
IMPRESSION: No acute or focal abnormality.

## 2014-11-30 NOTE — Telephone Encounter (Signed)
Bernadette from [place] [facility] called, patient c/o of pain and swelling  in left shoulder down to elbow for one week, no injury. Has appt with Janett Billow Wednesday, wants to get x-ray today. Per Dr. Mariea Clonts get x-ray of shoulder, arm, elbow. Order to [facility].

## 2014-11-30 NOTE — Telephone Encounter (Signed)
See other note

## 2014-12-02 ENCOUNTER — Encounter: Payer: Self-pay | Admitting: Nurse Practitioner

## 2014-12-02 ENCOUNTER — Non-Acute Institutional Stay: Payer: Medicare Other | Admitting: Nurse Practitioner

## 2014-12-02 ENCOUNTER — Ambulatory Visit
Admission: RE | Admit: 2014-12-02 | Discharge: 2014-12-02 | Disposition: A | Discharge status: Home / Self Care | Payer: Medicare Other | Source: Ambulatory Visit | Attending: Internal Medicine | Admitting: Internal Medicine

## 2014-12-02 VITALS — BP 140/84 | HR 80 | Temp 98.0°F | Wt 132.0 lb

## 2014-12-02 DIAGNOSIS — M19022 Primary osteoarthritis, left elbow: Secondary | ICD-10-CM | POA: Diagnosis not present

## 2014-12-02 DIAGNOSIS — M19012 Primary osteoarthritis, left shoulder: Secondary | ICD-10-CM | POA: Diagnosis not present

## 2014-12-02 DIAGNOSIS — M7712 Lateral epicondylitis, left elbow: Secondary | ICD-10-CM

## 2014-12-02 DIAGNOSIS — M25522 Pain in left elbow: Secondary | ICD-10-CM

## 2014-12-02 IMAGING — CR DG SHOULDER 2+V*L*
3 series · 3 of 3 positions shown · non-contrast
Comparison: None.

CLINICAL DATA: Chronic mild intermittent swelling of the left elbow
with some arm pain

EXAM:
LEFT SHOULDER - 2+ VIEW

[w shoulder ap external left]
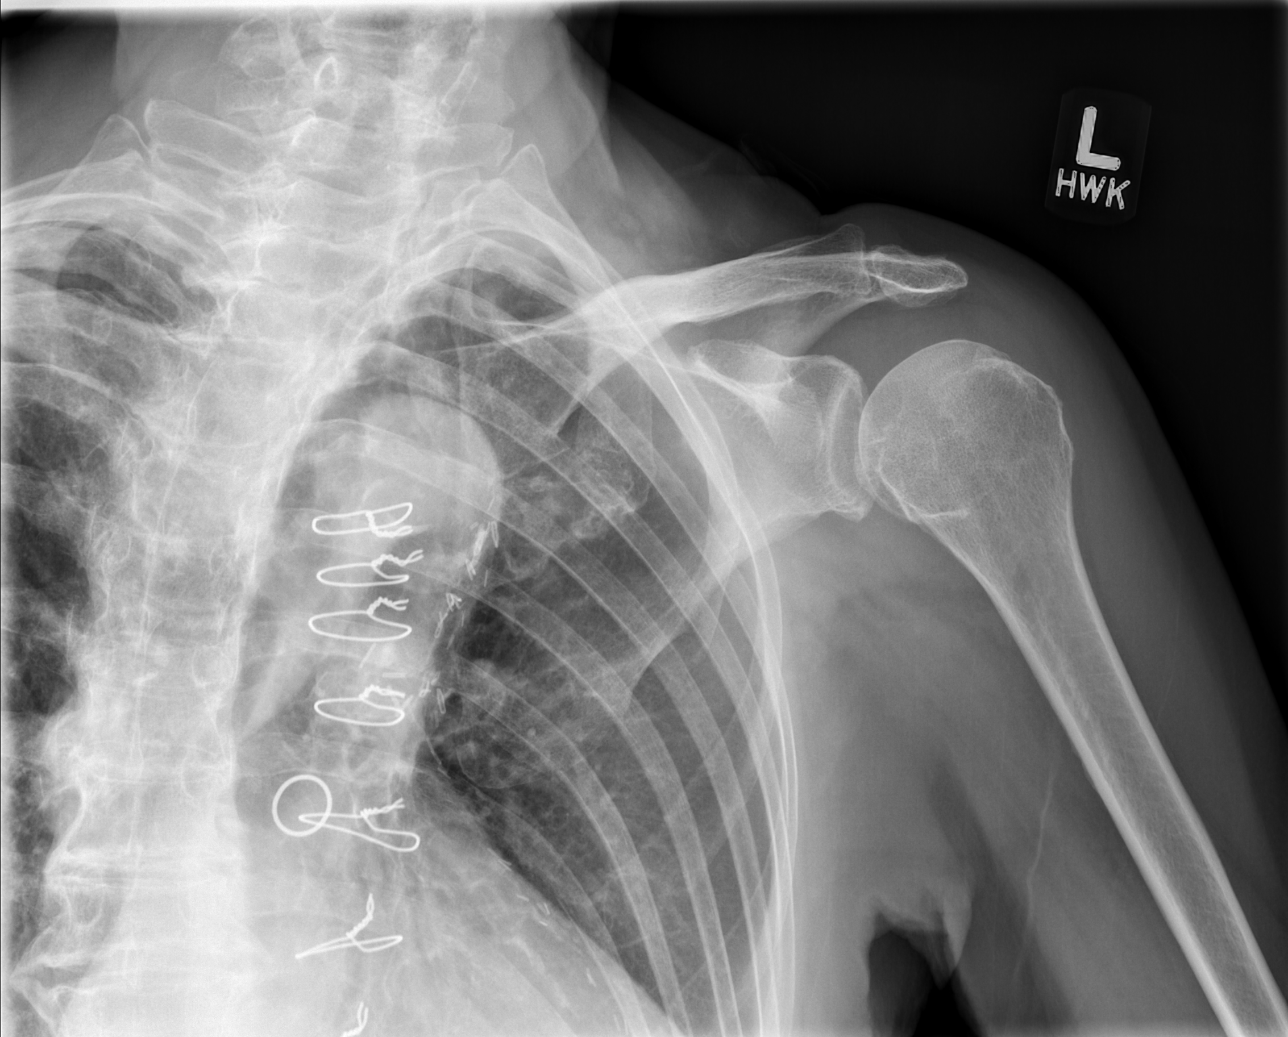

[w shoulder y view left]
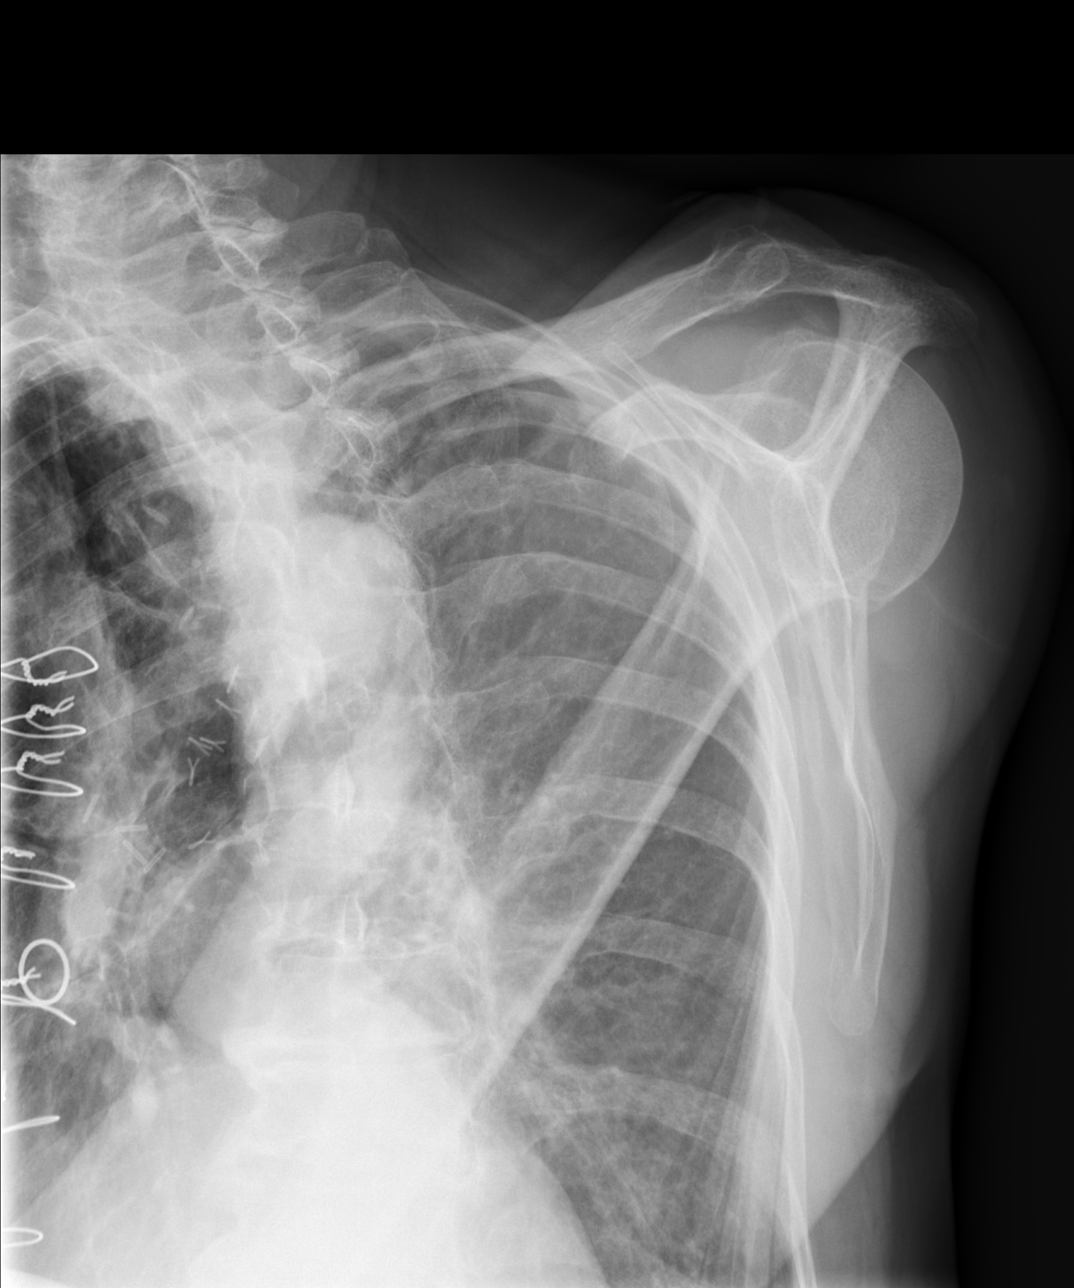

[w shoulder axillary left *]
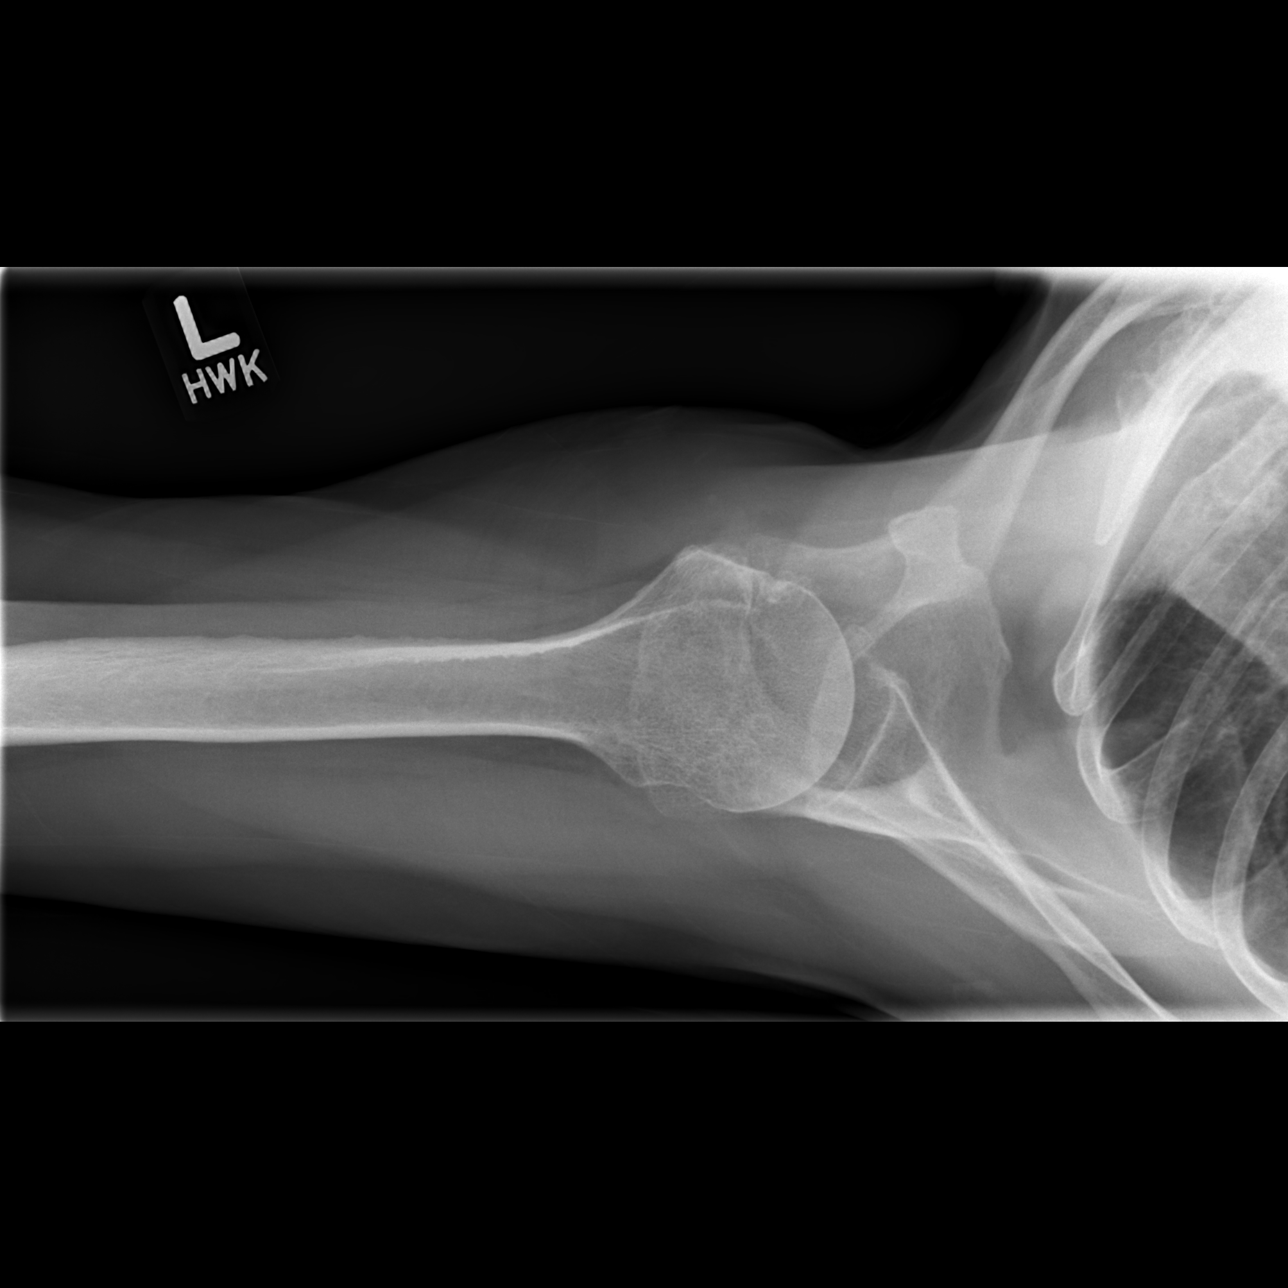

[3 of 3 positions shown; findings below may reference images not displayed]

FINDINGS: The left humeral head is in normal position in the left glenohumeral
joint space appears normal with only mild degenerative change
present for age. The left AC joint is normally aligned. No acute
abnormality is seen.
IMPRESSION: Mild degenerative joint disease of the left shoulder for age. No
acute abnormality.

## 2014-12-02 IMAGING — CR DG ELBOW COMPLETE 3+V*L*
4 series · 4 of 4 positions shown · non-contrast
Comparison: None.

CLINICAL DATA: No trauma. Chronic pain swelling. Initial
evaluation.

EXAM:
LEFT ELBOW - COMPLETE 3+ VIEW

[w elbow joint a.p. left]
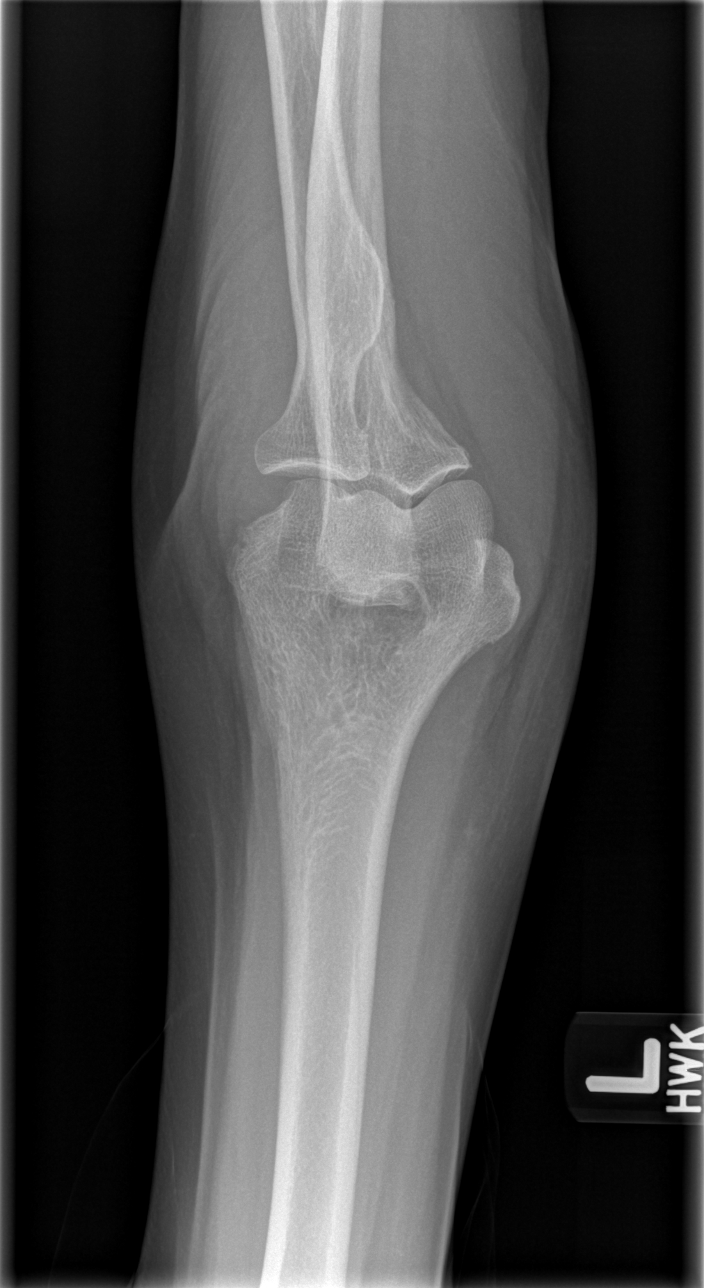

[w elbow joint obl left (1 of 2)]
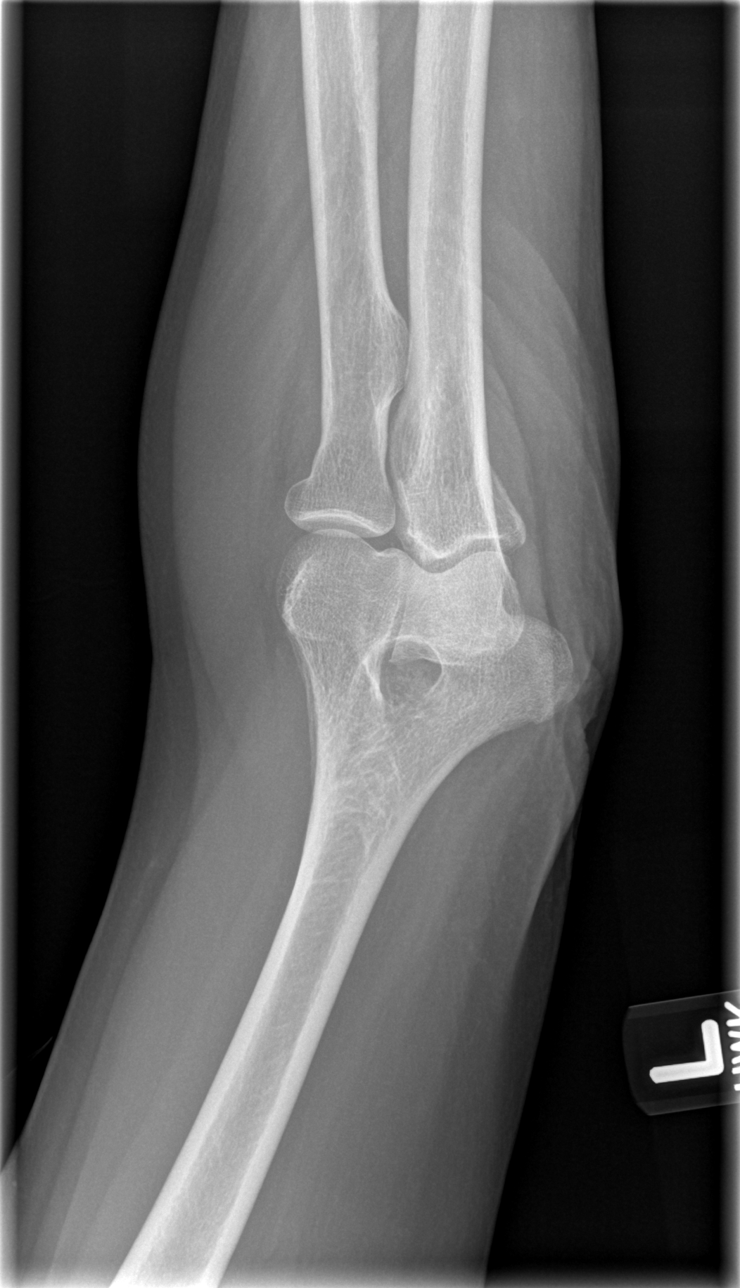

[w elbow joint obl left (2 of 2)]
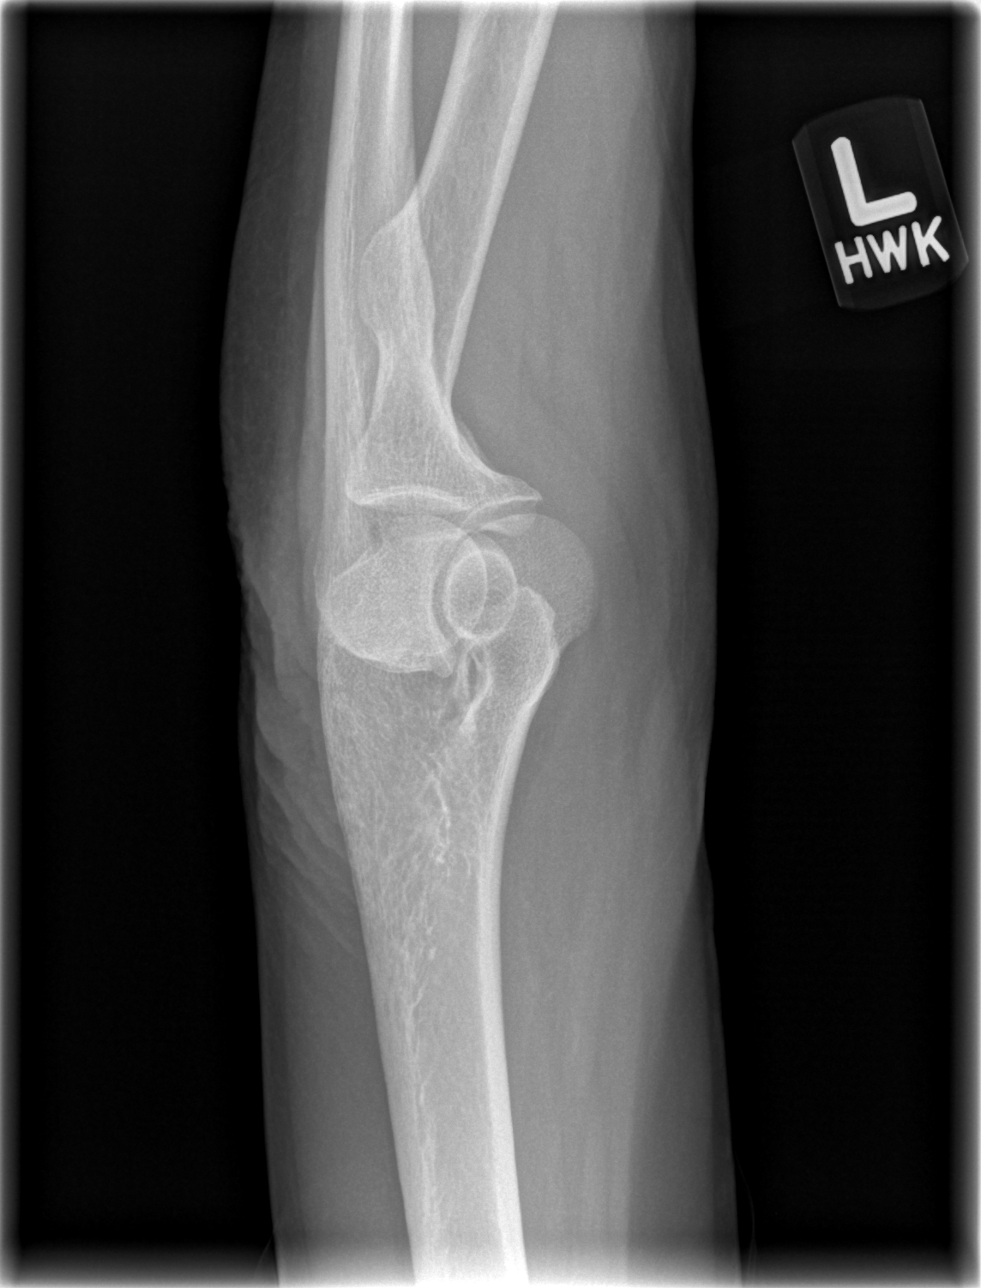

[w elbow joint lat left]
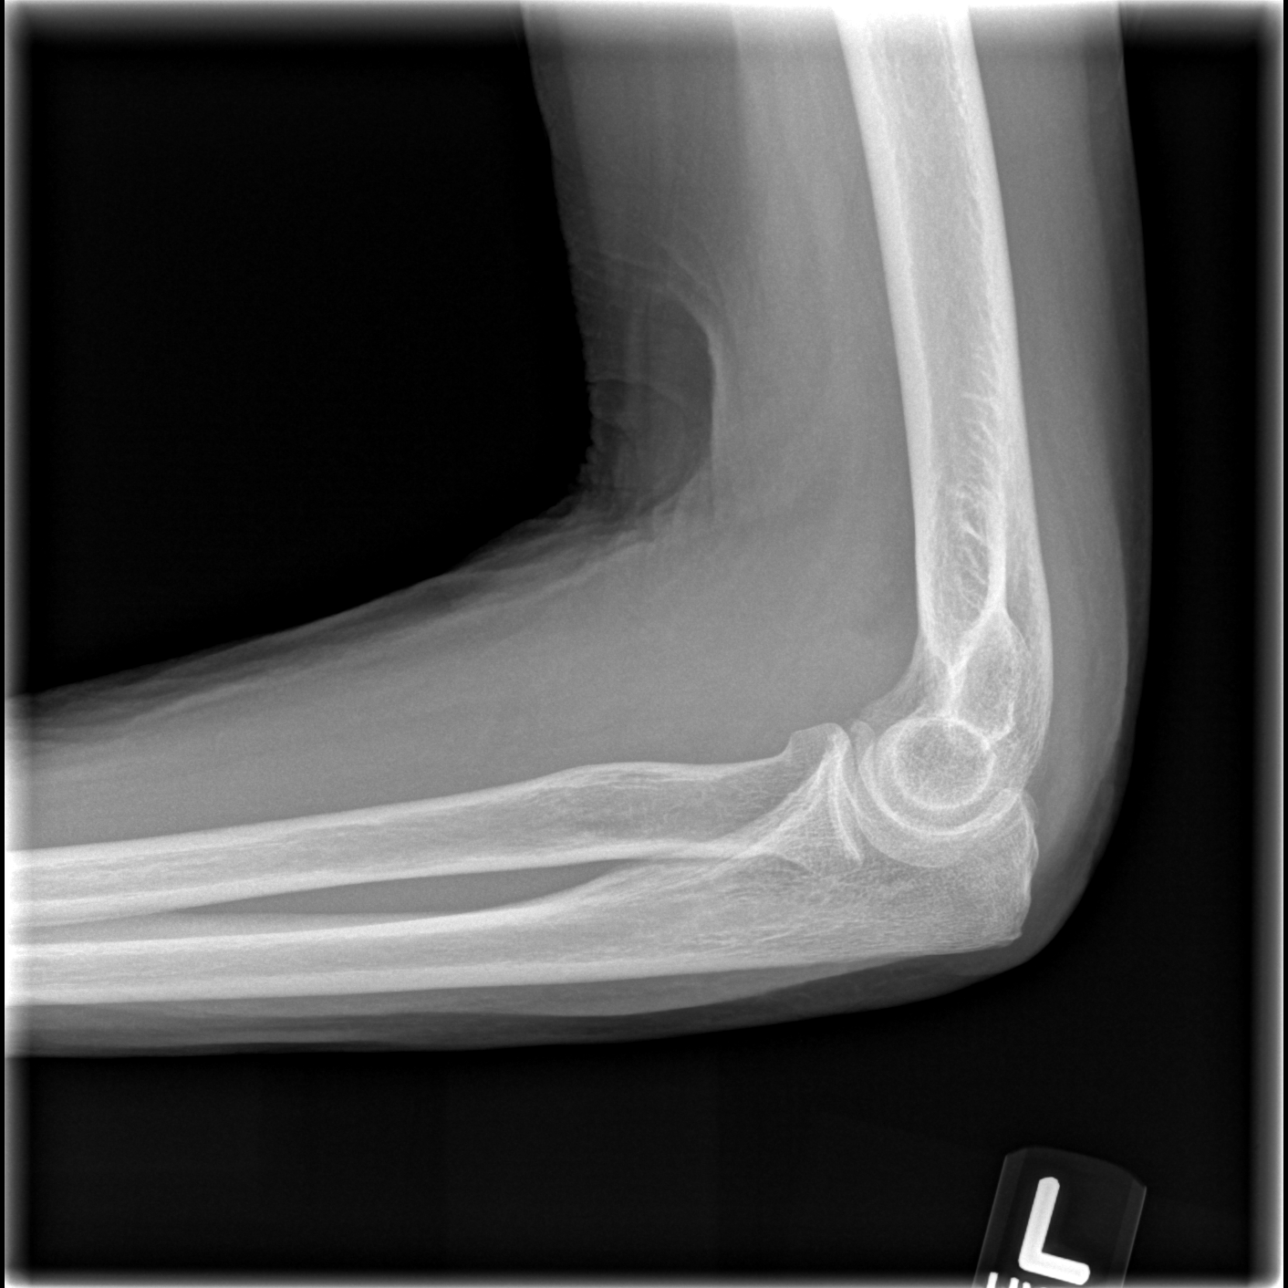

[4 of 4 positions shown; findings below may reference images not displayed]

FINDINGS: Left elbow joint effusion cannot be excluded. No focal bony
abnormality identified. Radial head is intact. Mild degenerative
change present.
IMPRESSION: 1. Cannot exclude left elbow joint effusion. Mild degenerative
changes noted about the left elbow joint.
2. No acute bony abnormality. If symptoms remain MRI can be
obtained.

## 2014-12-02 NOTE — Telephone Encounter (Signed)
Received X-Rays and notified Avel Sensor and faxed to [place].

## 2014-12-02 NOTE — Progress Notes (Signed)
Patient ID: Susan Conway, female   DOB: 01/25/1929, 81 y.o.   MRN: [id number]    Nursing Home Location:  [place] of Service: Clinic (12)  PCP: Estill Dooms, MD  Allergies  Allergen Reactions  . Iodinated Diagnostic Agents   . Sulfamethoxazole-Trimethoprim   . Bactrim Rash  . Relafen [Nabumetone] Rash    Chief Complaint  Patient presents with  . Joint Swelling    and reddness of left elbow, pain in upper left arm and shoulder.  Started last week. Hasn't fallen, no injury    HPI:  Patient is a 81 y.o. female seen today at [facility] with chief complaint of left elbow swelling and pain.  Pain and swelling began last week with pain in the upper arm and radiating to the elbow.  She has had this in the past in which it self resolves.  This time the swelling has persisted longer than normal.  She has taken one dose of hydrocodone with relief. Also using heat packs with relief.  Denies fall or injury.  Xrays of shoulder and elbow completed prior to visit and reveal mild degenerative changes and cannot exclude left elbow joint effusion.  Today the pain is mild, somewhat tender to touch and patient has full range of motion. Overall pain and ROM has greatly improved.   Review of Systems:  Review of Systems  Constitutional: Negative for fever, activity change and fatigue.  Respiratory: Negative for cough, shortness of breath and wheezing.   Cardiovascular: Negative for chest pain, palpitations and leg swelling.  Musculoskeletal: Positive for joint swelling. Negative for back pain and gait problem.  Skin: Negative for color change, pallor, rash and wound.    Past Medical History  Diagnosis Date  . PVC's (premature ventricular contractions)   . MI, old   . Osteoporosis   . Pain in joint, shoulder region 08/12/2012  . Other disorder of muscle, ligament, and fascia 04/08/2012  . Other abnormal blood chemistry 04/08/2012  . Degeneration of  lumbar or lumbosacral intervertebral disc 01/31/2012  . Cervicalgia 01/31/2012  . Pain in thoracic spine 01/31/2012  . 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 Myalgia and myositis, unspecified 01/17/2012  . Dizziness and giddiness 11/08/2011    vertigo  . Rash and other nonspecific skin eruption 08/02/2011  . Unspecified hypothyroidism 07/03/2011  . Pain in limb 01/11/2011  . Pain in joint, upper arm 12/26/2010  . Atrial fibrillation 09/21/2010  . Long term (current) use of anticoagulants 09/2010  . Other specified cardiac dysrhythmias(427.89) 06/27/2010  . External hemorrhoids without mention of complication 0/62/6948  . Varicose veins of lower extremities 08/16/2009  . Ganglion of tendon sheath 08/16/2009  . Cramp of limb 08/16/2009  . Disturbance of skin sensation 08/2009  . Lumbago 07/2009  . Meralgia paresthetica 07/2007  . Closed fracture of lumbar vertebra without mention of spinal cord injury 07/17/2005  . Unspecified essential hypertension 07/17/1997  . Coronary atherosclerosis of native coronary artery 07/17/1997  . Senile osteoporosis 07/17/1993  . Bell's palsy 07/18/1979  . Conjunctiva disorder 12/26/2010  . Varicose veins of lower extremities 08/16/2009  . External hemorrhoids without mention of complication 54/62/7035  . Other abnormal blood chemistry 2013    hyperglycemia  . Basal cell carcinoma 05/25/2014    Multiple removed by Dr. Danella Sensing in March 2015: right neck, left neck, scalp   . Actinic keratosis 05/25/2014  . Pain in joint, ankle and foot 10/27/2013    Bilateral  since 1998   . Leg cramp 10/27/2013    Most frequently the right leg.    Past Surgical History  Procedure Laterality Date  . Tonsillectomy  1940's  . Coronary artery bypass graft  1998    x2; LIMA to LAD; SVG to diagonal off bypass  . Hemorrhoid surgery  08/26/2012    Dr. Brantley Stage  . Skin biopsy  01/29/14    (R) neck; (R) scalp, 2 (L) neck; shave biopsy Superficial basal cell carcinoma Dr. Danella Sensing   Social History:   reports that she has never smoked. She has never used smokeless tobacco. She reports that she drinks alcohol. She reports that she does not use illicit drugs.  Family History  Problem Relation Age of Onset  . Heart attack Father   . Heart disease Father   . Hypertension Sister   . Heart disease Sister   . Heart disease Brother     Medications: Patient's Medications  New Prescriptions   No medications on file  Previous Medications   ASPIRIN 81 MG TABLET    Take 81 mg by mouth daily. Take 1 tablet daily to help prevent stroke and heart attack.   ATENOLOL (TENORMIN) 25 MG TABLET    Take 12.5 mg by mouth daily. Take 1/2 tablet daily to treat high blood pressure and angina.   CALCIUM CARBONATE (OS-CAL) 600 MG TABS    Take 600 mg by mouth 2 (two) times daily with a meal.    CHOLECALCIFEROL (VITAMIN D) 1000 UNITS TABLET    Take 1,000 Units by mouth daily. Take 1 tablet daily for Vitamin D supplement.   DABIGATRAN (PRADAXA) 150 MG CAPS CAPSULE    Take one capsule twice daily for anticoagulation   DIGOXIN (LANOXIN) 0.125 MG TABLET    Take one tablet every other day to control heart rhythm   HYDROCODONE-ACETAMINOPHEN (VICODIN ES) 7.5-750 MG PER TABLET    One every 6 hours if needed for pain   LORATADINE (CLARITIN) 10 MG TABLET    Take 10 mg by mouth daily as needed for allergies.    LORAZEPAM (ATIVAN) 1 MG TABLET    Take one tablet at bedtime to decrease anxiety and help with sleep   NYSTATIN-TRIAMCINOLONE (MYCOLOG II) CREAM    Apply twice daily to lesion on the left buttock   SIMVASTATIN (ZOCOR) 20 MG TABLET    TAKE 1 TABLET DAILY TO TREAT ELEVATED CHOLESTEROL  Modified Medications   No medications on file  Discontinued Medications   No medications on file     Physical Exam: Filed Vitals:   12/02/14 1309  BP: 140/84  Pulse: 80  Temp: 98 F (36.7 C)  TempSrc: Oral  Weight: 132 lb (59.875 kg)    Physical Exam  Constitutional: She appears well-developed  and well-nourished.  HENT:  Head: Normocephalic and atraumatic.  Cardiovascular: Normal heart sounds.   Pulmonary/Chest: Effort normal and breath sounds normal.  Musculoskeletal:       Left elbow: She exhibits swelling. She exhibits normal range of motion and no laceration. No tenderness found.  Normal range of motion and equal strength on both sides.  Elbow is slightly red and mildly swollen.      Labs reviewed: Basic Metabolic Panel:  Recent Labs  04/21/14  NA 142  K 3.8  BUN 21  CREATININE 0.9   Liver Function Tests:  Recent Labs  04/21/14  AST 18  ALT 12  ALKPHOS 57   No results for input(s): LIPASE, AMYLASE in  the last 8760 hours. No results for input(s): AMMONIA in the last 8760 hours. CBC: No results for input(s): WBC, NEUTROABS, HGB, HCT, MCV, PLT in the last 8760 hours. TSH:  Recent Labs  04/21/14  TSH 3.37   A1C: No results found for: HGBA1C Lipid Panel:  Recent Labs  04/21/14  CHOL 141  HDL 61  LDLCALC 71  TRIG 50     Assessment/Plan 1. Epicondylitis, left   Greatly improved over the last day, Continue using heat packs as needed  for comfort.  Use hydrocodone as needed for pain.  Follow up as needed with clinic.  If persist a PT referral may be needed.

## 2014-12-02 NOTE — Patient Instructions (Signed)

## 2014-12-03 ENCOUNTER — Encounter: Payer: Self-pay | Admitting: Internal Medicine

## 2014-12-15 ENCOUNTER — Other Ambulatory Visit: Payer: Self-pay | Admitting: *Deleted

## 2014-12-15 ENCOUNTER — Other Ambulatory Visit: Payer: Self-pay | Admitting: Internal Medicine

## 2014-12-15 MED ORDER — DABIGATRAN ETEXILATE MESYLATE 150 MG PO CAPS
ORAL_CAPSULE | ORAL | Status: DC
Start: 2014-12-15 — End: 2015-03-05

## 2014-12-15 NOTE — Telephone Encounter (Signed)
[facility] requested Rx to be sent to [facility] for 90day supply

## 2014-12-22 ENCOUNTER — Other Ambulatory Visit: Payer: Self-pay | Admitting: *Deleted

## 2014-12-22 MED ORDER — LORAZEPAM 1 MG PO TABS: ORAL_TABLET | ORAL | Status: DC

## 2014-12-22 NOTE — Telephone Encounter (Signed)
[facility] [address] 

## 2015-01-11 ENCOUNTER — Encounter: Payer: Self-pay | Admitting: Podiatry

## 2015-01-11 ENCOUNTER — Ambulatory Visit (INDEPENDENT_AMBULATORY_CARE_PROVIDER_SITE_OTHER): Payer: Medicare Other | Admitting: Podiatry

## 2015-01-11 VITALS — BP 156/93 | HR 88

## 2015-01-11 DIAGNOSIS — B351 Tinea unguium: Secondary | ICD-10-CM | POA: Diagnosis not present

## 2015-01-11 DIAGNOSIS — M79675 Pain in left toe(s): Secondary | ICD-10-CM

## 2015-01-11 NOTE — Progress Notes (Signed)
   Subjective:    Patient ID: Susan Conway, female    DOB: 04/01/1929, 81 y.o.   MRN: [id number]  HPI Comments: N Foot toe exam l  left great toe D going on for at least a year O ongoing C thick discolored toenail A  When wearing style shoes, rubs second toe T has been just keeping clean  Patient states that the second toe has been painful from time to time.    patient denies history of claudication or skin ulcerations   Review of Systems     Objective:   Physical Exam  Orientated 3  Vascular: DP pulses 1/4 bilaterally PT pulses 1/4 bilaterally Varicose veins medial ankles bilaterally  Neurological: Sensation to 10 g monofilament wire intact 5/5 bilaterally Ankle reflex equal and reactive bilaterally  Dermatological: Well-healed surgical scar medial right lower leg The left hallux nails hypertrophic, deformed and tender to palpation Remaining toenails are normal trophic  Musculoskeletal: HAV deformity right There is no restriction ankle, subtalar, midtarsal joints bilaterally        Assessment & Plan:   Assessment: Diminished pedal pulses bilaterally possibly suggestive of possible peripheral arterial disease Mild sensory neuropathy Symptomatic onychomycoses 1 (left hallux)  Plan: Discussed results of examination today with patient Debridement of left hallux without a bleeding  Reappoint at patient's request

## 2015-02-09 DIAGNOSIS — D224 Melanocytic nevi of scalp and neck: Secondary | ICD-10-CM | POA: Diagnosis not present

## 2015-02-09 DIAGNOSIS — L821 Other seborrheic keratosis: Secondary | ICD-10-CM | POA: Diagnosis not present

## 2015-02-09 DIAGNOSIS — C4441 Basal cell carcinoma of skin of scalp and neck: Secondary | ICD-10-CM | POA: Diagnosis not present

## 2015-02-09 DIAGNOSIS — Z85828 Personal history of other malignant neoplasm of skin: Secondary | ICD-10-CM | POA: Diagnosis not present

## 2015-02-09 DIAGNOSIS — D485 Neoplasm of uncertain behavior of skin: Secondary | ICD-10-CM | POA: Diagnosis not present

## 2015-03-05 ENCOUNTER — Encounter: Payer: Self-pay | Admitting: Internal Medicine

## 2015-03-05 ENCOUNTER — Ambulatory Visit (INDEPENDENT_AMBULATORY_CARE_PROVIDER_SITE_OTHER): Payer: Medicare Other | Admitting: Internal Medicine

## 2015-03-05 VITALS — BP 152/94 | HR 86 | Ht 66.5 in | Wt 133.0 lb

## 2015-03-05 DIAGNOSIS — I2510 Atherosclerotic heart disease of native coronary artery without angina pectoris: Secondary | ICD-10-CM

## 2015-03-05 DIAGNOSIS — I10 Essential (primary) hypertension: Secondary | ICD-10-CM

## 2015-03-05 DIAGNOSIS — I482 Chronic atrial fibrillation, unspecified: Secondary | ICD-10-CM

## 2015-03-05 DIAGNOSIS — I4891 Unspecified atrial fibrillation: Secondary | ICD-10-CM | POA: Diagnosis not present

## 2015-03-05 MED ORDER — ATENOLOL 25 MG PO TABS: 12.5000 mg | ORAL_TABLET | Freq: Every day | ORAL | Status: DC

## 2015-03-05 MED ORDER — DABIGATRAN ETEXILATE MESYLATE 150 MG PO CAPS: ORAL_CAPSULE | ORAL | Status: DC

## 2015-03-05 MED ORDER — DIGOXIN 125 MCG PO TABS: ORAL_TABLET | ORAL | Status: DC

## 2015-03-05 NOTE — Progress Notes (Signed)
HPI Susan Conway returns today for followup. She is a very pleasant 81 year old woman with coronary artery disease status post bypass surgery, chronic atrial fibrillation, and dyslipidemia. In the interim, she has done well. She denies chest pain, shortness of breath, or peripheral edema. No syncope. She remains active walking albeit at a reduce frequency because her husband has been ill. Allergies  Allergen Reactions  . Iodinated Diagnostic Agents     Severe nausea and also passed out  . Sulfamethoxazole-Trimethoprim     Rash   . Bactrim     rash  . Relafen [Nabumetone] Rash     Current Outpatient Prescriptions  Medication Sig Dispense Refill  . aspirin 81 MG tablet Take 81 mg by mouth daily. Take 1 tablet daily to help prevent stroke and heart attack.    Marland Kitchen atenolol (TENORMIN) 25 MG tablet Take 12.5 mg by mouth daily. To treat high blood pressure and angina.    . calcium carbonate (OS-CAL) 600 MG TABS Take 600 mg by mouth 2 (two) times daily with a meal.     . cholecalciferol (VITAMIN D) 1000 UNITS tablet Take 1,000 Units by mouth daily. For Vitamin D supplement.    . digoxin (LANOXIN) 0.125 MG tablet Take one tablet every other day to control heart rhythm (Patient taking differently: Take one tablet by mouth every other day to control heart rhythm) 90 tablet 3  . HYDROcodone-acetaminophen (VICODIN ES) 7.5-750 MG per tablet One every 6 hours if needed for pain (Patient taking differently: Take one tablet by mouth every 6 hours if needed for pain) 100 tablet 0  . loratadine (CLARITIN) 10 MG tablet Take 10 mg by mouth daily as needed for allergies.     Marland Kitchen LORazepam (ATIVAN) 1 MG tablet Take one tablet at bedtime to decrease anxiety and help with sleep (Patient taking differently: Take one tablet by mouth at bedtime to decrease anxiety and help with sleep) 90 tablet 1  . nystatin-triamcinolone (MYCOLOG II) cream Apply 1 application topically 2 (two) times daily as needed (left buttock).    Marland Kitchen  PRADAXA 150 MG CAPS capsule TAKE ONE CAPSULE BY MOUTH TWICE A DAY FOR ANTICOAGULATION 180 capsule 0  . simvastatin (ZOCOR) 20 MG tablet TAKE 1 TABLET DAILY TO TREAT ELEVATED CHOLESTEROL (Patient taking differently: TAKE 1 TABLET BY MOUTH DAILY TO TREAT ELEVATED CHOLESTEROL) 90 tablet 3   No current facility-administered medications for this visit.     Past Medical History  Diagnosis Date  . PVC's (premature ventricular contractions)   . MI, old   . Osteoporosis   . Pain in joint, shoulder region 08/12/2012  . Other disorder of muscle, ligament, and fascia 04/08/2012  . Other abnormal blood chemistry 04/08/2012  . Degeneration of lumbar or lumbosacral intervertebral disc 01/31/2012  . Cervicalgia 01/31/2012  . Pain in thoracic spine 01/31/2012  . 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 Myalgia and myositis, unspecified 01/17/2012  . Dizziness and giddiness 11/08/2011    vertigo  . Rash and other nonspecific skin eruption 08/02/2011  . Unspecified hypothyroidism 07/03/2011  . Pain in limb 01/11/2011  . Pain in joint, upper arm 12/26/2010  . Atrial fibrillation 09/21/2010  . Long term (current) use of anticoagulants 09/2010  . Other specified cardiac dysrhythmias(427.89) 06/27/2010  . External hemorrhoids without mention of complication 2/95/1884  . Varicose veins of lower extremities 08/16/2009  . Ganglion of tendon sheath 08/16/2009  . Cramp of limb 08/16/2009  . Disturbance of skin sensation 08/2009  . Lumbago 07/2009  .  Meralgia paresthetica 07/2007  . Closed fracture of lumbar vertebra without mention of spinal cord injury 07/17/2005  . Unspecified essential hypertension 07/17/1997  . Coronary atherosclerosis of native coronary artery 07/17/1997  . Senile osteoporosis 07/17/1993  . Bell's palsy 07/18/1979  . Conjunctiva disorder 12/26/2010  . Varicose veins of lower extremities 08/16/2009  . External hemorrhoids without mention of complication 62/95/2841  . Other abnormal blood chemistry  2013    hyperglycemia  . Basal cell carcinoma 05/25/2014    Multiple removed by Dr. Danella Sensing in March 2015: right neck, left neck, scalp   . Actinic keratosis 05/25/2014  . Pain in joint, ankle and foot 10/27/2013    Bilateral since 1998   . Leg cramp 10/27/2013    Most frequently the right leg.     ROS:   All systems reviewed and negative except as noted in the HPI.   Past Surgical History  Procedure Laterality Date  . Tonsillectomy  1940's  . Coronary artery bypass graft  1998    x2; LIMA to LAD; SVG to diagonal off bypass  . Hemorrhoid surgery  08/26/2012    Dr. Brantley Stage  . Skin biopsy  01/29/14    (R) neck; (R) scalp, 2 (L) neck; shave biopsy Superficial basal cell carcinoma Dr. Danella Sensing     Family History  Problem Relation Age of Onset  . Heart attack Father   . Heart disease Father   . Hypertension Sister   . Heart disease Sister   . Heart disease Brother      History   Social History  . Marital Status: Married    Spouse Name: N/A  . Number of Children: N/A  . Years of Education: N/A   Occupational History  . Retired Pharmacist, hospital    Social History Main Topics  . Smoking status: Never Smoker   . Smokeless tobacco: Never Used  . Alcohol Use: Yes     Comment: occasional wine  . Drug Use: No  . Sexual Activity: Not on file   Other Topics Concern  . Not on file   Social History Narrative   Living at [place] since 2010   Married - Charles   Never smoked   Alcohol occasional wine   Exercise walk not daily, house work    DNR      BP 152/94 mmHg  Pulse 86  Ht 5' 6.5" (1.689 m)  Wt 133 lb (60.328 kg)  BMI 21.15 kg/m2  Physical Exam:  Well appearing elderly woman, NAD HEENT: Unremarkable Neck:  6 cm JVD, no thyromegally Lungs:  Clear with no wheezes, rales, or rhonchi. HEART:  IRegular rate rhythm, no murmurs, no rubs, no clicks Abd:  soft, positive bowel sounds, no organomegally, no rebound, no guarding Ext:  2 plus pulses, no edema,  no cyanosis, no clubbing Skin:  No rashes no nodules Neuro:  CN II through XII intact, motor grossly intact  EKG - atrial fibrillation with a controlled ventricular response.   Assess/Plan:

## 2015-03-05 NOTE — Patient Instructions (Signed)
Medication Instructions:  Your physician recommends that you continue on your current medications as directed. Please refer to the Current Medication list given to you today.   Labwork: NONE   Testing/Procedures: NONE  Follow-Up: Your physician wants you to follow-up in: 12 months with Dr. Taylor. You will receive a reminder letter in the mail two months in advance. If you don't receive a letter, please call our office to schedule the follow-up appointment.     

## 2015-03-05 NOTE — Assessment & Plan Note (Signed)
Her blood pressure is elevated today. She is encouraged to reduce her salt intake, increase her physical activity, and will consider additional blood pressure medications if her blood pressure remains elevated.

## 2015-03-05 NOTE — Assessment & Plan Note (Signed)
Her ventricular rate is well controlled. She will continue her current medical therapy with beta blockers and digoxin. She will continue systemic anticoagulation.

## 2015-03-16 ENCOUNTER — Encounter: Payer: Self-pay | Admitting: Internal Medicine

## 2015-03-16 ENCOUNTER — Non-Acute Institutional Stay: Payer: Medicare Other | Admitting: Internal Medicine

## 2015-03-16 VITALS — BP 142/84 | HR 80 | Temp 97.8°F | Wt 131.0 lb

## 2015-03-16 DIAGNOSIS — M7712 Lateral epicondylitis, left elbow: Secondary | ICD-10-CM | POA: Diagnosis not present

## 2015-03-16 DIAGNOSIS — M25432 Effusion, left wrist: Secondary | ICD-10-CM | POA: Diagnosis not present

## 2015-03-16 DIAGNOSIS — M25532 Pain in left wrist: Secondary | ICD-10-CM

## 2015-03-16 DIAGNOSIS — I2510 Atherosclerotic heart disease of native coronary artery without angina pectoris: Secondary | ICD-10-CM | POA: Diagnosis not present

## 2015-03-16 DIAGNOSIS — I482 Chronic atrial fibrillation, unspecified: Secondary | ICD-10-CM

## 2015-03-16 DIAGNOSIS — M545 Low back pain: Secondary | ICD-10-CM | POA: Diagnosis not present

## 2015-03-16 NOTE — Progress Notes (Signed)
Patient ID: Susan Conway, female   DOB: 1928-12-20, 81 y.o.   MRN: [id number]   Location:  [facility]  Code Status: DNR Goals of Care: Advanced Directive information Does patient have an advance directive?: Yes, Type of Advance Directive: Living will;Out of facility DNR (pink MOST or yellow form), Pre-existing out of facility DNR order (yellow form or pink MOST form): Pink MOST form placed in chart (order not valid for inpatient use);Yellow form placed in chart (order not valid for inpatient use), Does patient want to make changes to advanced directive?: No - Patient declined   Allergies  Allergen Reactions  . Iodinated Diagnostic Agents     Severe nausea and also passed out  . Sulfamethoxazole-Trimethoprim     Rash   . Bactrim     rash  . Relafen [Nabumetone] Rash    Chief Complaint  Patient presents with  . Medical Management of Chronic Issues    blood pressure, A-Fib    HPI: Patient is a 81 y.o. seen in the office today for med mgt of chronic diseases.  Says she's doing all right.  H/o CABG, afib.  Saw Dr. Lovena Le last week--to f/u in one year.    Slight swelling of left dorsal wrist that is worst in the morning and gets better through the day--some pain going up her arm at times.  A month or two ago, had a 4-5 inch red painful swollen area on her forearm, xrays were negative, and it just went away.    Is on pradaxa and does bruise easily.  Had squirrel run into her ankle yesterday.    Every now and then will have a vagal reaction which seem connected to food she says.  Had vomiting episode after bbq sandwich, then can't get nausea under control.  Has blacked out.    Occasionally takes a hydrocodone for episode where she fractured a vertebra in 2009 with vagal episode.  Once in several weeks, she uses one.    If rests, her back pain tends to go away.    Review of Systems:  Review of Systems  Constitutional: Negative for fever and chills.  HENT: Negative for hearing  loss.   Eyes: Negative for blurred vision.       Glasses  Respiratory: Negative for shortness of breath.   Cardiovascular: Negative for chest pain.  Gastrointestinal: Negative for abdominal pain, constipation, blood in stool and melena.  Genitourinary: Negative for dysuria, urgency and frequency.  Musculoskeletal: Positive for back pain, joint pain and falls.       In context of syncope  Psychiatric/Behavioral: Negative for depression and memory loss.     Past Medical History  Diagnosis Date  . PVC's (premature ventricular contractions)   . MI, old   . Osteoporosis   . Pain in joint, shoulder region 08/12/2012  . Other disorder of muscle, ligament, and fascia 04/08/2012  . Other abnormal blood chemistry 04/08/2012  . Degeneration of lumbar or lumbosacral intervertebral disc 01/31/2012  . Cervicalgia 01/31/2012  . Pain in thoracic spine 01/31/2012  . 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 Myalgia and myositis, unspecified 01/17/2012  . Dizziness and giddiness 11/08/2011    vertigo  . Rash and other nonspecific skin eruption 08/02/2011  . Unspecified hypothyroidism 07/03/2011  . Pain in limb 01/11/2011  . Pain in joint, upper arm 12/26/2010  . Atrial fibrillation 09/21/2010  . Long term (current) use of anticoagulants 09/2010  . Other specified cardiac dysrhythmias(427.89) 06/27/2010  . External  hemorrhoids without mention of complication 06/19/4817  . Varicose veins of lower extremities 08/16/2009  . Ganglion of tendon sheath 08/16/2009  . Cramp of limb 08/16/2009  . Disturbance of skin sensation 08/2009  . Lumbago 07/2009  . Meralgia paresthetica 07/2007  . Closed fracture of lumbar vertebra without mention of spinal cord injury 07/17/2005  . Unspecified essential hypertension 07/17/1997  . Coronary atherosclerosis of native coronary artery 07/17/1997  . Senile osteoporosis 07/17/1993  . Bell's palsy 07/18/1979  . Conjunctiva disorder 12/26/2010  . Varicose veins of lower extremities  08/16/2009  . External hemorrhoids without mention of complication 56/31/4970  . Other abnormal blood chemistry 2013    hyperglycemia  . Basal cell carcinoma 05/25/2014    Multiple removed by Dr. Danella Sensing in March 2015: right neck, left neck, scalp   . Actinic keratosis 05/25/2014  . Pain in joint, ankle and foot 10/27/2013    Bilateral since 1998   . Leg cramp 10/27/2013    Most frequently the right leg.     Past Surgical History  Procedure Laterality Date  . Tonsillectomy  1940's  . Coronary artery bypass graft  1998    x2; LIMA to LAD; SVG to diagonal off bypass  . Hemorrhoid surgery  08/26/2012    Dr. Brantley Stage  . Skin biopsy  01/29/14    (R) neck; (R) scalp, 2 (L) neck; shave biopsy Superficial basal cell carcinoma Dr. Danella Sensing    Social History:   reports that she has never smoked. She has never used smokeless tobacco. She reports that she drinks alcohol. She reports that she does not use illicit drugs.  Family History  Problem Relation Age of Onset  . Heart attack Father   . Heart disease Father   . Hypertension Sister   . Heart disease Sister   . Heart disease Brother     Medications: Patient's Medications  New Prescriptions   No medications on file  Previous Medications   ASPIRIN 81 MG TABLET    Take 81 mg by mouth daily. Take 1 tablet daily to help prevent stroke and heart attack.   ATENOLOL (TENORMIN) 25 MG TABLET    Take 0.5 tablets (12.5 mg total) by mouth daily. To treat high blood pressure and angina.   CALCIUM CARBONATE (OS-CAL) 600 MG TABS    Take 600 mg by mouth 2 (two) times daily with a meal.    CHOLECALCIFEROL (VITAMIN D) 1000 UNITS TABLET    Take 1,000 Units by mouth daily. For Vitamin D supplement.   DABIGATRAN (PRADAXA) 150 MG CAPS CAPSULE    TAKE ONE CAPSULE BY MOUTH TWICE A DAY FOR ANTICOAGULATION   DIGOXIN (LANOXIN) 0.125 MG TABLET    Take one tablet by mouth every other day to control heart rhythm   HYDROCODONE-ACETAMINOPHEN (VICODIN ES)  7.5-750 MG PER TABLET    One every 6 hours if needed for pain   LORATADINE (CLARITIN) 10 MG TABLET    Take 10 mg by mouth daily as needed for allergies.    LORAZEPAM (ATIVAN) 1 MG TABLET    Take one tablet at bedtime to decrease anxiety and help with sleep   NYSTATIN-TRIAMCINOLONE (MYCOLOG II) CREAM    Apply 1 application topically 2 (two) times daily as needed (left buttock).   SIMVASTATIN (ZOCOR) 20 MG TABLET    TAKE 1 TABLET DAILY TO TREAT ELEVATED CHOLESTEROL  Modified Medications   No medications on file  Discontinued Medications   No medications on file  Physical Exam: Filed Vitals:   03/16/15 1411  BP: 142/84  Pulse: 80  Temp: 97.8 F (36.6 C)  TempSrc: Oral  Weight: 131 lb (59.421 kg)  Physical Exam  Constitutional: She is oriented to person, place, and time. She appears well-developed and well-nourished. No distress.  Cardiovascular:  irreg irreg  Pulmonary/Chest: Effort normal and breath sounds normal. No respiratory distress.  Abdominal: Soft. Bowel sounds are normal.  Musculoskeletal: Normal range of motion.  Neurological: She is alert and oriented to person, place, and time.  Skin: Skin is warm and dry.   Labs reviewed: Basic Metabolic Panel:  Recent Labs  04/21/14  NA 142  K 3.8  BUN 21  CREATININE 0.9  TSH 3.37   Liver Function Tests:  Recent Labs  04/21/14  AST 18  ALT 12  ALKPHOS 57   No results for input(s): LIPASE, AMYLASE in the last 8760 hours. No results for input(s): AMMONIA in the last 8760 hours. CBC: No results for input(s): WBC, NEUTROABS, HGB, HCT, MCV, PLT in the last 8760 hours. Lipid Panel:  Recent Labs  04/21/14  CHOL 141  HDL 61  LDLCALC 71  TRIG 50   Assessment/Plan 1. Pain and swelling of wrist, left -etiology uncertain--worse at night and swelling is more prominent when she wakens in the morning -? Sleeping on it the wrong way--possibly a tendonitis of the wrist; also could be small hematoma from just slightly  bumping her wrist w/o recall (bruising easily)  2. Coronary artery disease involving native coronary artery of native heart without angina pectoris -doing well, asymptomatic, cont med mgt--she asked if any of her meds could be stopped at this time, but majority are for this and her afib and she seems to need them  -only question is asa and pradaxa, but she just saw Dr. Lovena Le and he opted to continue her on both--is bruising quite a bit  3. Chronic atrial fibrillation -cont pradaxa, asa, dig, atenolol  4. Epicondylitis, lateral humeral, left - historical, resolved  5.  Low back pain:  Mid back, rare, usually relieved with rest; occasionally requires hydrocodone but 1x in 3 wks.  Labs/tests ordered:  Labs before next visit Next appt:  6 mos  Martavius Lusty L. Larrie Fraizer, D.O. [facility] [address], [place] [address] Cell Phone (Mon-Fri 8am-5pm):  [phone] On Call:  [phone] & follow prompts after 5pm & weekends Office Phone:  [phone] Office Fax:  [phone]

## 2015-03-31 ENCOUNTER — Telehealth: Payer: Self-pay

## 2015-03-31 DIAGNOSIS — M25432 Effusion, left wrist: Secondary | ICD-10-CM

## 2015-03-31 DIAGNOSIS — M25532 Pain in left wrist: Principal | ICD-10-CM

## 2015-03-31 NOTE — Telephone Encounter (Signed)
Patient came in with husband for his appointment, still having trouble with left hand swelling and tender and painful at night. Had spoke with Dr. Mariea Clonts on 03/16/15, she told her if it continue would get it x-ray. Sherrie Mustache, NP said to put order for it. [facility] two views.

## 2015-04-01 ENCOUNTER — Ambulatory Visit
Admission: RE | Admit: 2015-04-01 | Discharge: 2015-04-01 | Disposition: A | Discharge status: Home / Self Care | Payer: Medicare Other | Source: Ambulatory Visit | Attending: Nurse Practitioner | Admitting: Nurse Practitioner

## 2015-04-01 DIAGNOSIS — M19042 Primary osteoarthritis, left hand: Secondary | ICD-10-CM | POA: Diagnosis not present

## 2015-04-01 DIAGNOSIS — M25432 Effusion, left wrist: Secondary | ICD-10-CM

## 2015-04-01 DIAGNOSIS — M25532 Pain in left wrist: Principal | ICD-10-CM

## 2015-04-01 IMAGING — CR DG HAND COMPLETE 3+V*L*
3 series · 3 of 3 positions shown · non-contrast
Comparison: None

CLINICAL DATA: Posterior hand and wrist pain.

EXAM:
LEFT HAND - COMPLETE 3+ VIEW

[view not recorded (1 of 3)]
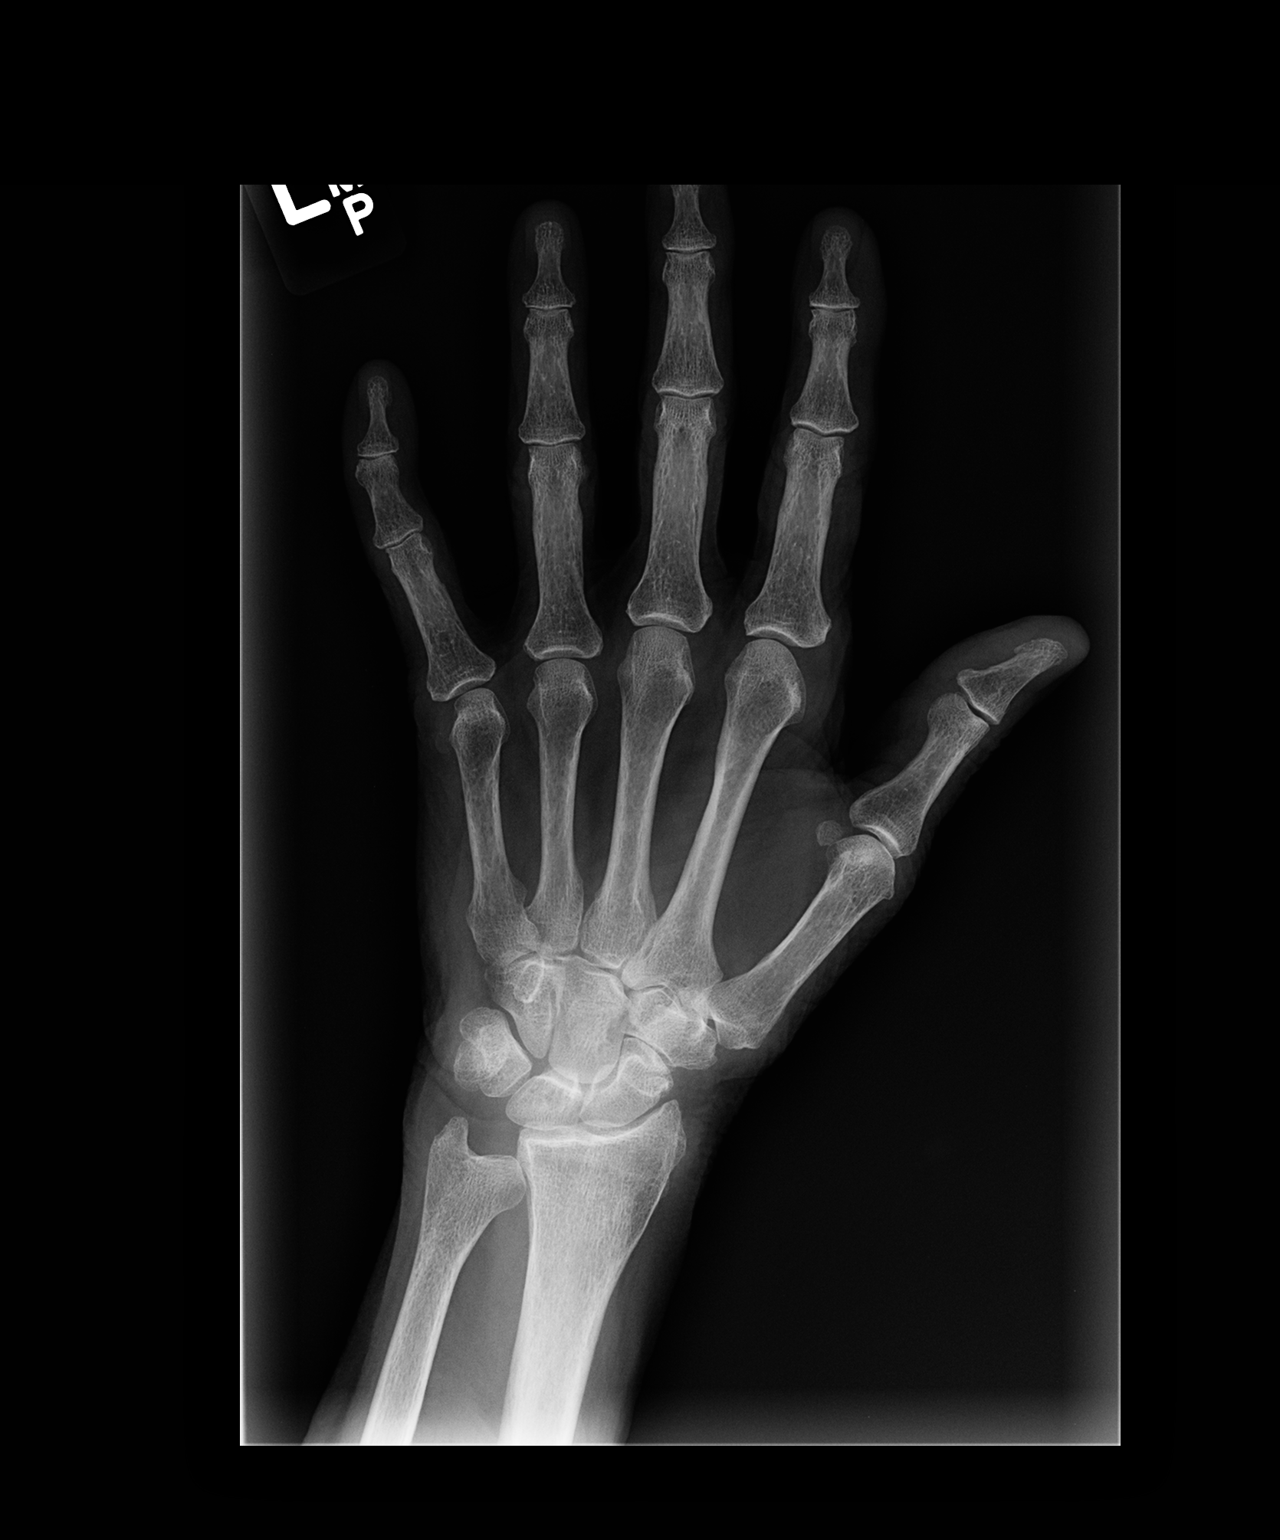

[view not recorded (2 of 3)]
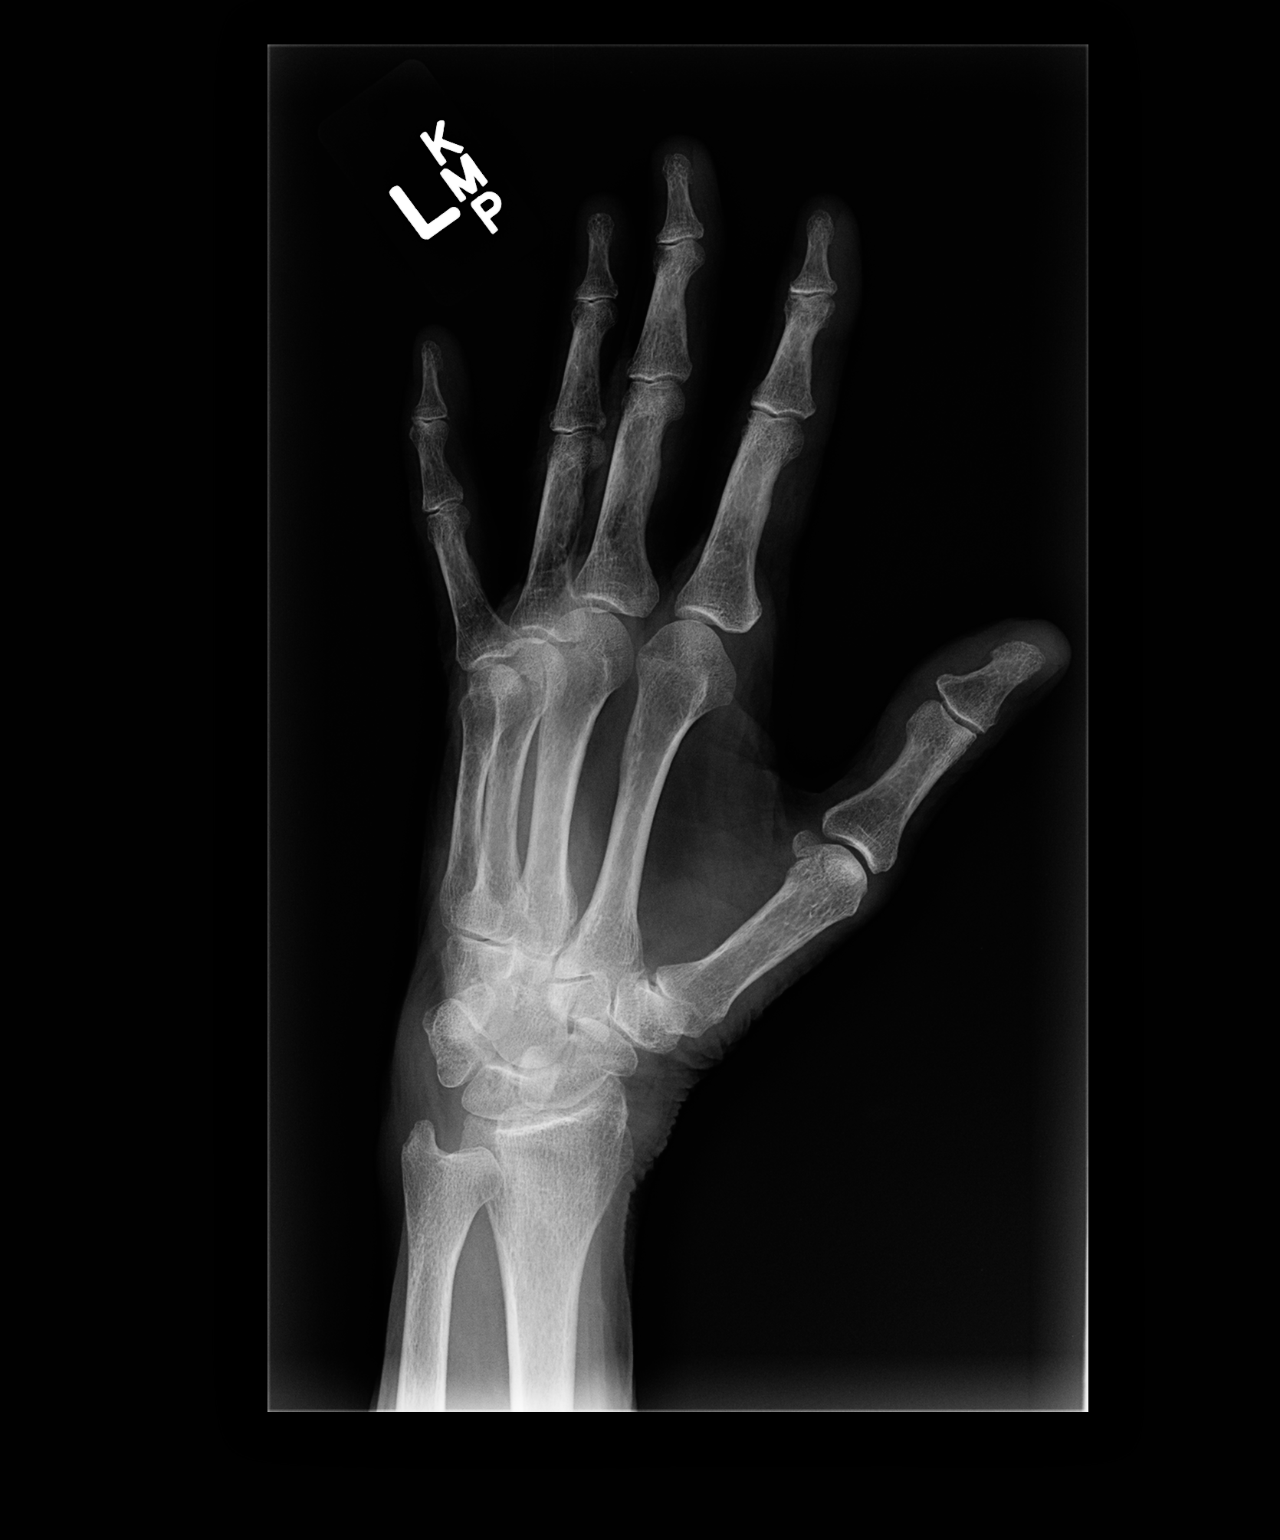

[view not recorded (3 of 3)]
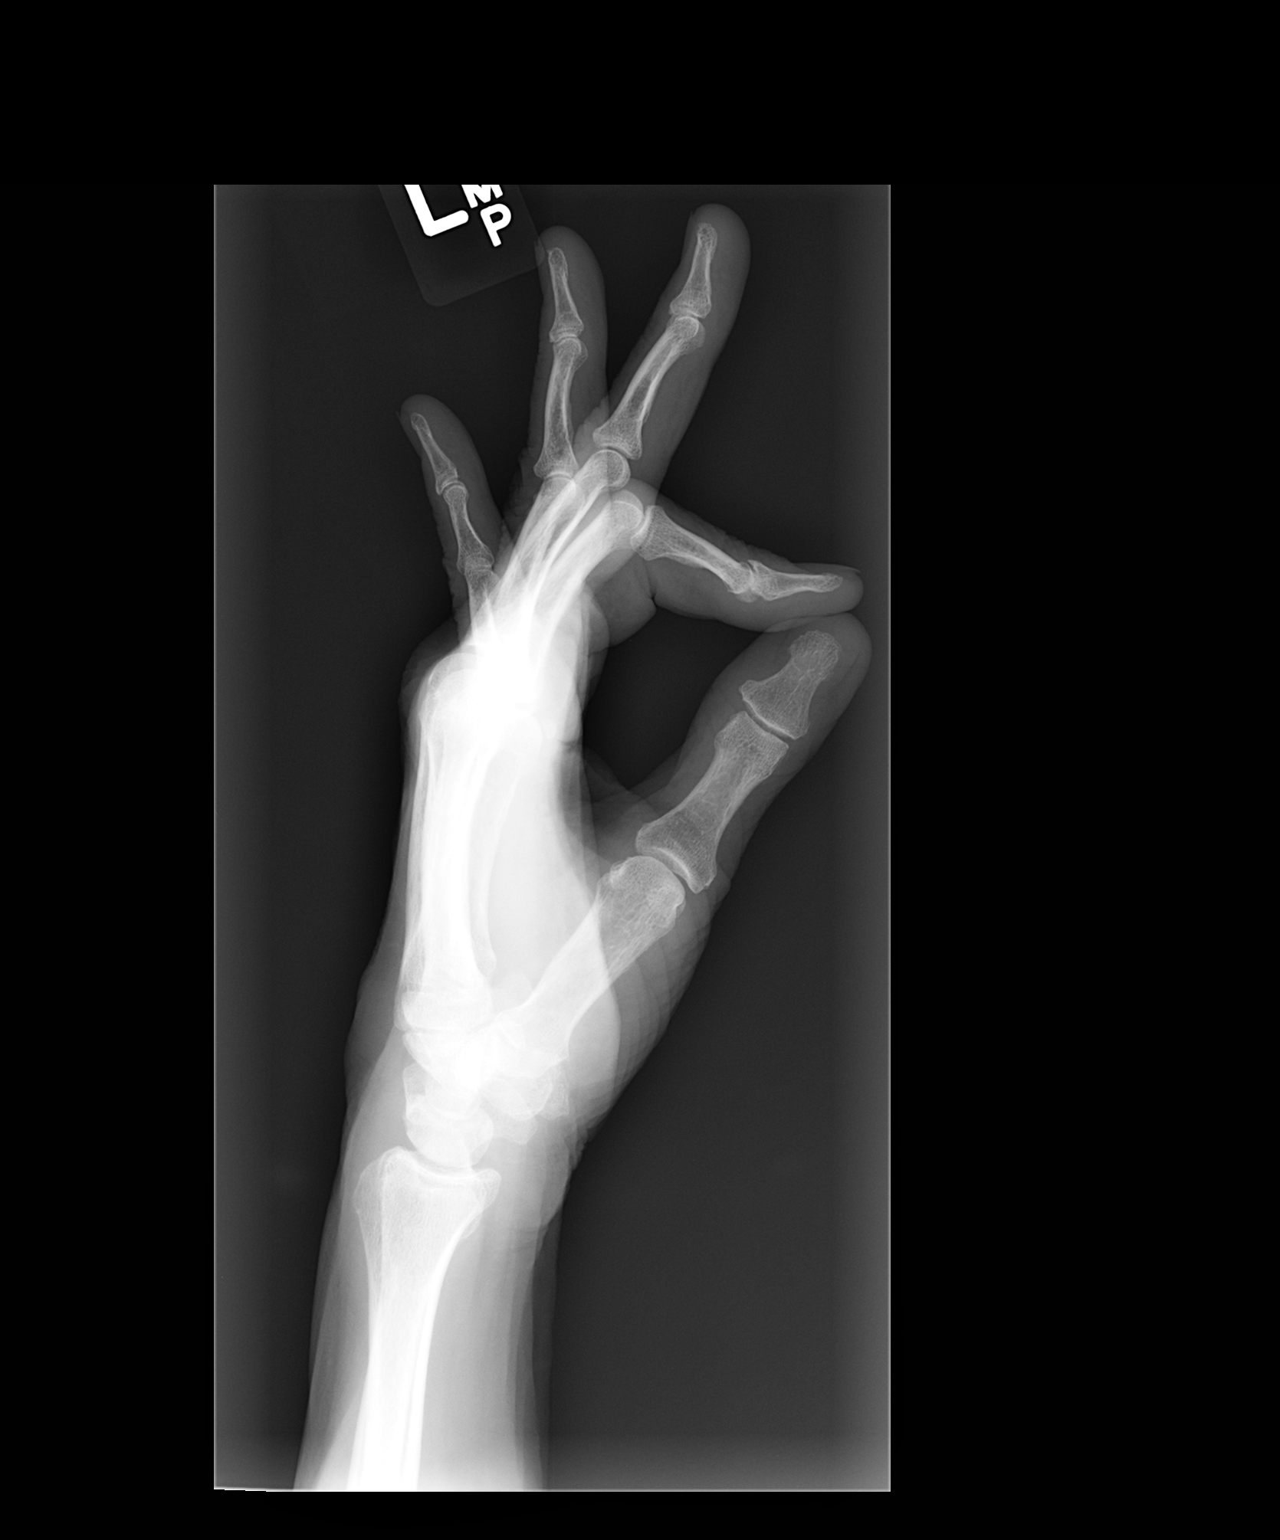

[3 of 3 positions shown; findings below may reference images not displayed]

FINDINGS: The bones appear osteopenic. There is mild DIP joint osteoarthritis
involving the index finger and ring finger. There is also minimal
degenerative changes involving the fifth DIP joint. No focal bone
erosions identified.
IMPRESSION: 1. No acute bone abnormalities.
2. Mild degenerative changes.

## 2015-04-06 ENCOUNTER — Telehealth: Payer: Self-pay | Admitting: *Deleted

## 2015-04-06 NOTE — Telephone Encounter (Signed)
Ok, if Janett Billow knows results, I would sent this note to her.

## 2015-04-06 NOTE — Telephone Encounter (Signed)
The patient call regarding her x-ray of her left hand, she was looking for the results. I informed her of the results given by Sherrie Mustache, but she wants to know what is going to be done now about her wrist.

## 2015-04-10 ENCOUNTER — Other Ambulatory Visit: Payer: Self-pay | Admitting: Internal Medicine

## 2015-04-10 ENCOUNTER — Other Ambulatory Visit: Payer: Self-pay | Admitting: Nurse Practitioner

## 2015-04-29 ENCOUNTER — Telehealth: Payer: Self-pay

## 2015-04-29 NOTE — Telephone Encounter (Signed)
Patient called, wants a referral to hand specialists. Continues to have swelling top of left hand, it aches at night. Will check with Dr. Mariea Clonts.

## 2015-05-13 NOTE — Telephone Encounter (Signed)
Dr Fredna Dow, or Dr Caralyn Guile, gave Mrs Friedt the names and numbers, she will call and make the appt.

## 2015-05-17 DIAGNOSIS — M67432 Ganglion, left wrist: Secondary | ICD-10-CM | POA: Diagnosis not present

## 2015-07-05 ENCOUNTER — Other Ambulatory Visit: Payer: Self-pay | Admitting: Orthopedic Surgery

## 2015-07-06 ENCOUNTER — Other Ambulatory Visit: Payer: Self-pay | Admitting: *Deleted

## 2015-07-06 ENCOUNTER — Encounter (HOSPITAL_BASED_OUTPATIENT_CLINIC_OR_DEPARTMENT_OTHER): Payer: Self-pay | Admitting: *Deleted

## 2015-07-06 MED ORDER — SIMVASTATIN 20 MG PO TABS
ORAL_TABLET | ORAL | Status: DC
Start: 2015-07-06 — End: 2016-03-22

## 2015-07-06 MED ORDER — ATENOLOL 25 MG PO TABS: ORAL_TABLET | ORAL | Status: DC

## 2015-07-06 NOTE — Telephone Encounter (Signed)
[facility]

## 2015-07-26 ENCOUNTER — Ambulatory Visit (HOSPITAL_BASED_OUTPATIENT_CLINIC_OR_DEPARTMENT_OTHER): Admission: RE | Admit: 2015-07-26 | Payer: Medicare Other | Source: Ambulatory Visit | Admitting: Orthopedic Surgery

## 2015-07-26 ENCOUNTER — Other Ambulatory Visit: Payer: Self-pay | Admitting: Internal Medicine

## 2015-07-26 HISTORY — DX: Anxiety disorder, unspecified: F41.9

## 2015-07-26 SURGERY — EXCISION MASS
Anesthesia: Choice | Site: Wrist | Laterality: Left

## 2015-07-27 ENCOUNTER — Telehealth: Payer: Self-pay

## 2015-07-27 ENCOUNTER — Ambulatory Visit: Payer: Medicare Other | Admitting: Nurse Practitioner
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
## 2015-08-18 ENCOUNTER — Other Ambulatory Visit: Payer: Self-pay

## 2015-08-18 DIAGNOSIS — Z1231 Encounter for screening mammogram for malignant neoplasm of breast: Secondary | ICD-10-CM

## 2015-08-26 DIAGNOSIS — Z23 Encounter for immunization: Secondary | ICD-10-CM | POA: Diagnosis not present

## 2015-09-14 DIAGNOSIS — I10 Essential (primary) hypertension: Secondary | ICD-10-CM | POA: Diagnosis not present

## 2015-09-14 DIAGNOSIS — E039 Hypothyroidism, unspecified: Secondary | ICD-10-CM | POA: Diagnosis not present

## 2015-09-14 DIAGNOSIS — E785 Hyperlipidemia, unspecified: Secondary | ICD-10-CM | POA: Diagnosis not present

## 2015-09-14 LAB — HEPATIC FUNCTION PANEL
ALK PHOS: 63 U/L (ref 25–125)
ALT: 11 U/L (ref 7–35)
AST: 20 U/L (ref 13–35)
Bilirubin, Total: 0.8 mg/dL

## 2015-09-14 LAB — BASIC METABOLIC PANEL
BUN: 19 mg/dL (ref 4–21)
Creatinine: 0.9 mg/dL (ref 0.5–1.1)
GLUCOSE: 96 mg/dL
Potassium: 4.2 mmol/L (ref 3.4–5.3)
Sodium: 142 mmol/L (ref 137–147)

## 2015-09-14 LAB — CBC AND DIFFERENTIAL
HEMATOCRIT: 39 % (ref 36–46)
HEMOGLOBIN: 13.5 g/dL (ref 12.0–16.0)
Platelets: 127 10*3/uL — AB (ref 150–399)
WBC: 6 10*3/mL

## 2015-09-14 LAB — LIPID PANEL
CHOLESTEROL: 134 mg/dL (ref 0–200)
HDL: 67 mg/dL (ref 35–70)
LDL CALC: 67 mg/dL
LDl/HDL Ratio: 2
Triglycerides: 52 mg/dL (ref 40–160)

## 2015-09-14 LAB — TSH: TSH: 3.8 u[IU]/mL (ref 0.41–5.90)

## 2015-09-15 ENCOUNTER — Other Ambulatory Visit: Payer: Self-pay

## 2015-09-21 ENCOUNTER — Ambulatory Visit
Admission: RE | Admit: 2015-09-21 | Discharge: 2015-09-21 | Disposition: A | Discharge status: Home / Self Care | Payer: Medicare Other | Source: Ambulatory Visit

## 2015-09-21 ENCOUNTER — Encounter: Payer: Self-pay | Admitting: Internal Medicine

## 2015-09-21 DIAGNOSIS — Z1231 Encounter for screening mammogram for malignant neoplasm of breast: Secondary | ICD-10-CM

## 2015-09-22 ENCOUNTER — Non-Acute Institutional Stay: Payer: Medicare Other | Admitting: Internal Medicine

## 2015-09-22 ENCOUNTER — Encounter: Payer: Self-pay | Admitting: Internal Medicine

## 2015-09-22 VITALS — BP 138/82 | HR 96 | Temp 98.3°F | Ht 65.0 in | Wt 126.0 lb

## 2015-09-22 DIAGNOSIS — M25532 Pain in left wrist: Secondary | ICD-10-CM

## 2015-09-22 DIAGNOSIS — M545 Low back pain: Secondary | ICD-10-CM

## 2015-09-22 DIAGNOSIS — I482 Chronic atrial fibrillation, unspecified: Secondary | ICD-10-CM

## 2015-09-22 DIAGNOSIS — R739 Hyperglycemia, unspecified: Secondary | ICD-10-CM

## 2015-09-22 DIAGNOSIS — I2510 Atherosclerotic heart disease of native coronary artery without angina pectoris: Secondary | ICD-10-CM | POA: Diagnosis not present

## 2015-09-22 DIAGNOSIS — M25432 Effusion, left wrist: Secondary | ICD-10-CM

## 2015-09-22 DIAGNOSIS — Z23 Encounter for immunization: Secondary | ICD-10-CM | POA: Diagnosis not present

## 2015-09-22 NOTE — Progress Notes (Signed)
Patient ID: Susan Conway, female   DOB: 1929/09/10, 81 y.o.   MRN: [id number]   Location: [facility] Provider: Leiyah Maultsby L. Mariea Clonts, D.O., C.M.D.  Code Status: DNR Goals of Care: Advanced Directive information Does patient have an advance directive?: Yes, Type of Advance Directive: [place];Living will;Out of facility DNR (pink MOST or yellow form), Pre-existing out of facility DNR order (yellow form or pink MOST form): Yellow form placed in chart (order not valid for inpatient use)  Chief Complaint  Patient presents with  . Annual Exam    Comprehensive exam  . Medical Management of Chronic Issues    blood pressure, thyroid, cholesterol, A-Fib, MMSE today 30/30 passed clock drawing    HPI: Patient is a 81 y.o. female seen in the office today for an annual wellness exam and med mgt of chronic diseases.    Depression screen [facility] 2/9 09/22/2015 03/16/2015 05/25/2014 04/28/2013  Decreased Interest 0 0 0 0  Down, Depressed, Hopeless 0 0 0 0  PHQ - 2 Score 0 0 0 0    Fall Risk  09/22/2015 03/16/2015 05/25/2014 04/28/2013  Falls in the past year? Yes No No No  Number falls in past yr: 1 - - -  Injury with Fall? No - - -   MMSE - Mini Mental State Exam 09/22/2015  Orientation to time 5  Orientation to Place 5  Registration 3  Attention/ Calculation 5  Recall 3  Language- name 2 objects 2  Language- repeat 1  Language- follow 3 step command 3  Language- read & follow direction 1  Write a sentence 1  Copy design 1  Total score 30  passed clock drawing.   Health Maintenance  Topic Date Due  . PNA vac Low Risk Adult (2 of 2 - PPSV23) 11/06/2008  . INFLUENZA VACCINE  06/06/2016  . TETANUS/TDAP  11/06/2017  . DEXA SCAN  Completed  . ZOSTAVAX  Completed   Urinary incontinence?  No bladder control problems Functional status?  Independent in activities--she did her husband's pills and hers. Exercise?  She is doing all of the regular things--goes out to feed birds,  water flowers, decorating and keeping busy.  Very active all of the time.   Diet?  Does not follow special diet.  Gets meal from dining room and goes to eat with Susan Conway in rehab.   Vision:  Wears her glasses. Has f/u appt in Jan.  Dr. Ellie Lunch Hearing:  Hearing ok.  Right ear had a bad wax buildup at one time.  She used drops and oil and no results until flushed.   Dentition:  Chewing well and just saw dentist a couple of weeks ago.  Wants ears checked. Previous right cerumen impaction.    Taking simvastatin since heart surgery in 1998.  Last several years having some cramps in calves and right ankle.  Has read that statins can cause leg cramps. CoQ10 discussed.  If ineffective will try reducing or stopping the simvastatin short term and see if cramps resolve.  Also has intermittent right > left "hip" pain (sacroiliac) region when walking.  Improves with rest.  Toenail fungus remains but seems to be growing out on great toenail after some debridement by podiatry  She asked about how to tell Eduard Clos about moving to SNF b/c he keeps asking about when he can come home.  Advised to be honest with him about what he needs professionals to help him with to be safe.    Review of  Systems:  Review of Systems  Constitutional: Negative for fever and chills.  HENT: Positive for hearing loss (h/o cerumen impaction). Negative for congestion.   Eyes: Negative for blurred vision.       Glasses  Respiratory: Negative for shortness of breath.   Cardiovascular: Negative for chest pain and leg swelling.  Genitourinary: Negative for dysuria, urgency and frequency.  Musculoskeletal: Positive for back pain and joint pain. Negative for falls.       Leg cramps; left wrist cyst/swelling still present  Neurological: Negative for dizziness, loss of consciousness and headaches.  Psychiatric/Behavioral: Negative for depression and memory loss.    Past Medical History  Diagnosis Date  . PVC's (premature ventricular  contractions)   . MI, old   . Osteoporosis   . Pain in joint, shoulder region 08/12/2012  . Other disorder of muscle, ligament, and fascia 04/08/2012  . Other abnormal blood chemistry 04/08/2012  . Degeneration of lumbar or lumbosacral intervertebral disc 01/31/2012  . Cervicalgia 01/31/2012  . Pain in thoracic spine 01/31/2012  . Pathologic fracture of vertebrae ([place]) 01/22/2012  . Myalgia and myositis, unspecified 01/17/2012  . Dizziness and giddiness 11/08/2011    vertigo  . Rash and other nonspecific skin eruption 08/02/2011  . Pain in limb 01/11/2011  . Pain in joint, upper arm 12/26/2010  . Atrial fibrillation ([place]) 09/21/2010  . Long term (current) use of anticoagulants 09/2010  . Other specified cardiac dysrhythmias(427.89) 06/27/2010  . External hemorrhoids without mention of complication 123XX123  . Varicose veins of lower extremities 08/16/2009  . Ganglion of tendon sheath 08/16/2009  . Cramp of limb 08/16/2009  . Disturbance of skin sensation 08/2009  . Lumbago 07/2009  . Meralgia paresthetica 07/2007  . Closed fracture of lumbar vertebra without mention of spinal cord injury 07/17/2005  . Unspecified essential hypertension 07/17/1997  . Coronary atherosclerosis of native coronary artery 07/17/1997  . Senile osteoporosis 07/17/1993  . Bell's palsy 07/18/1979  . Conjunctiva disorder 12/26/2010  . Varicose veins of lower extremities 08/16/2009  . External hemorrhoids without mention of complication [id number]  . Other abnormal blood chemistry 2013    hyperglycemia  . Basal cell carcinoma 05/25/2014    Multiple removed by Dr. Danella Sensing in March 2015: right neck, left neck, scalp   . Actinic keratosis 05/25/2014  . Pain in joint, ankle and foot 10/27/2013    Bilateral since 1998   . Leg cramp 10/27/2013    Most frequently the right leg.   Marland Kitchen Anxiety     Past Surgical History  Procedure Laterality Date  . Tonsillectomy  1940's  . Coronary artery bypass graft  1998    x2; LIMA to  LAD; SVG to diagonal off bypass  . Hemorrhoid surgery  08/26/2012    Dr. Brantley Stage  . Skin biopsy  01/29/14    (R) neck; (R) scalp, 2 (L) neck; shave biopsy Superficial basal cell carcinoma Dr. Danella Sensing     Allergies  Allergen Reactions  . Iodinated Diagnostic Agents     Severe nausea and also passed out  . Sulfamethoxazole-Trimethoprim     Rash   . Bactrim     rash  . Relafen [Nabumetone] Rash      Medication List       This list is accurate as of: 09/22/15  3:19 PM.  Always use your most recent med list.               aspirin 81 MG tablet  Take 81 mg by  mouth daily. Take 1 tablet daily to help prevent stroke and heart attack.     atenolol 25 MG tablet  Commonly known as:  TENORMIN  Take 1/2 tablet by mouth once daily To treat high blood pressure and angina.     calcium carbonate 600 MG Tabs tablet  Commonly known as:  OS-CAL  Take 600 mg by mouth 2 (two) times daily with a meal.     cholecalciferol 1000 UNITS tablet  Commonly known as:  VITAMIN D  Take 1,000 Units by mouth daily. For Vitamin D supplement.     digoxin 0.125 MG tablet  Commonly known as:  LANOXIN  Take one tablet by mouth every other day to control heart rhythm     HYDROcodone-acetaminophen 7.5-750 MG tablet  Commonly known as:  VICODIN ES  One every 6 hours if needed for pain     loratadine 10 MG tablet  Commonly known as:  CLARITIN  Take 10 mg by mouth daily as needed for allergies.     LORazepam 1 MG tablet  Commonly known as:  ATIVAN  TAKE 1 TABLET BY MOUTH AT BEDTIME TO DECREASE ANXIETY AND HELP WITH SLEEP     nystatin-triamcinolone cream  Commonly known as:  MYCOLOG II  Apply 1 application topically 2 (two) times daily as needed (left buttock).     PRADAXA 150 MG Caps capsule  Generic drug:  dabigatran  TAKE ONE CAPSULE BY MOUTH TWICE A DAY FOR ANTICOAGULATION     simvastatin 20 MG tablet  Commonly known as:  ZOCOR  Take one tablet by mouth once daily for cholesterol         Physical Exam: Filed Vitals:   09/22/15 1452  BP: 138/82  Pulse: 96  Temp: 98.3 F (36.8 C)  TempSrc: Oral  Height: 5\' 5"  (1.651 m)  Weight: 126 lb (57.153 kg)  SpO2: 99%   Body mass index is 20.97 kg/(m^2). Physical Exam  Constitutional: She is oriented to person, place, and time. She appears well-developed and well-nourished. No distress.  HENT:  Head: Normocephalic and atraumatic.  Right Ear: External ear normal.  Left Ear: External ear normal.  Nose: Nose normal.  Mouth/Throat: Oropharynx is clear and moist. No oropharyngeal exudate.  Left cerumen increased  Eyes: EOM are normal. Pupils are equal, round, and reactive to light.  glasses  Neck: Normal range of motion. Neck supple. No JVD present. No thyromegaly present.  Cardiovascular: Intact distal pulses.   irreg irreg  Pulmonary/Chest: Effort normal and breath sounds normal. She has no rales. Right breast exhibits no inverted nipple, no mass, no nipple discharge, no skin change and no tenderness. Left breast exhibits no inverted nipple, no mass, no nipple discharge, no skin change and no tenderness.  Abdominal: Soft. Bowel sounds are normal. She exhibits no distension and no mass. There is no tenderness. There is no rebound and no guarding. No hernia.  Musculoskeletal: Normal range of motion. She exhibits no edema or tenderness.  Lymphadenopathy:    She has no cervical adenopathy.  Neurological: She is alert and oriented to person, place, and time. She has normal reflexes. No cranial nerve deficit.  Skin: Skin is warm and dry. There is pallor.  Psychiatric: She has a normal mood and affect. Her behavior is normal. Judgment and thought content normal.    Labs reviewed: Basic Metabolic Panel:  Recent Labs  09/14/15  NA 142  K 4.2  BUN 19  CREATININE 0.9  TSH 3.80   Liver Function  Tests:  Recent Labs  09/14/15  AST 20  ALT 11  ALKPHOS 63   No results for input(s): LIPASE, AMYLASE in the last 8760  hours. No results for input(s): AMMONIA in the last 8760 hours. CBC:  Recent Labs  09/14/15  WBC 6.0  HGB 13.5  HCT 39  PLT 127*   Lipid Panel:  Recent Labs  09/14/15  CHOL 134  HDL 67  LDLCALC 67  TRIG 52   No results found for: HGBA1C  Procedures: Mm Screening Breast Tomo Bilateral  09/22/2015  CLINICAL DATA:  Screening. EXAM: DIGITAL SCREENING BILATERAL MAMMOGRAM WITH 3D TOMO WITH CAD COMPARISON:  Previous exam(s). ACR Breast Density Category c: The breast tissue is heterogeneously dense, which may obscure small masses. FINDINGS: There are no findings suspicious for malignancy. Images were processed with CAD. IMPRESSION: No mammographic evidence of malignancy. A result letter of this screening mammogram will be mailed directly to the patient. RECOMMENDATION: Screening mammogram in one year. (Code:SM-B-01Y) BI-RADS CATEGORY  1: Negative. Electronically Signed   By: Nolon Nations M.D.   On: 09/22/2015 10:29    Assessment/Plan See annual wellness in hpi 1. Need for vaccination with 13-polyvalent pneumococcal conjugate vaccine - Pneumococcal conjugate vaccine 13-valent  2. Pain and swelling of wrist, left -has put off surgery at least for now b/c it's not bothering her much and she's very busy dealing with transitioning Susan Conway to snf (husband currently in rehab)  3. Coronary artery disease involving native coronary artery of native heart without angina pectoris -no problems since cabg -cont statin, atenolol, not on central beta blocker or ace, cont baby asa  4. Chronic atrial fibrillation (HCC) -is on pradaxa for anticoagulation--? If baby asa also needed -cont dig, rate is controlled -follows with Dr. Lovena Le once a year  5. Midline low back pain, with sciatica presence unspecified -she is tolerating this ok, continues exercise, very active lady and uses minimal medications, rests when hurts  6. Hyperglycemia -cont to monitor--was noted in the past, but recent  fasting glucose normal    Next appt:  6 mos   Davonda Ausley L. Zeriah Baysinger, D.O. [facility] [address], [place] [address] Cell Phone (Mon-Fri 8am-5pm):  [phone] On Call:  [phone] & follow prompts after 5pm & weekends Office Phone:  [phone] Office Fax:  [phone]

## 2015-10-07 ENCOUNTER — Other Ambulatory Visit: Payer: Self-pay | Admitting: Internal Medicine

## 2015-10-12 ENCOUNTER — Ambulatory Visit
Admission: RE | Admit: 2015-10-12 | Discharge: 2015-10-12 | Disposition: A | Discharge status: Home / Self Care | Payer: Medicare Other | Source: Ambulatory Visit | Attending: Internal Medicine | Admitting: Internal Medicine

## 2015-10-12 ENCOUNTER — Other Ambulatory Visit: Payer: Self-pay | Admitting: Internal Medicine

## 2015-10-12 DIAGNOSIS — M25561 Pain in right knee: Secondary | ICD-10-CM

## 2015-10-12 DIAGNOSIS — M179 Osteoarthritis of knee, unspecified: Secondary | ICD-10-CM | POA: Diagnosis not present

## 2015-10-12 IMAGING — CR DG KNEE COMPLETE 4+V*R*
1 series · 1 of 1 positions shown · non-contrast
Comparison: None.

CLINICAL DATA: Posterior and patellar right knee pain. Generalized
swelling.

EXAM:
RIGHT KNEE - COMPLETE 4+ VIEW

[view not recorded]
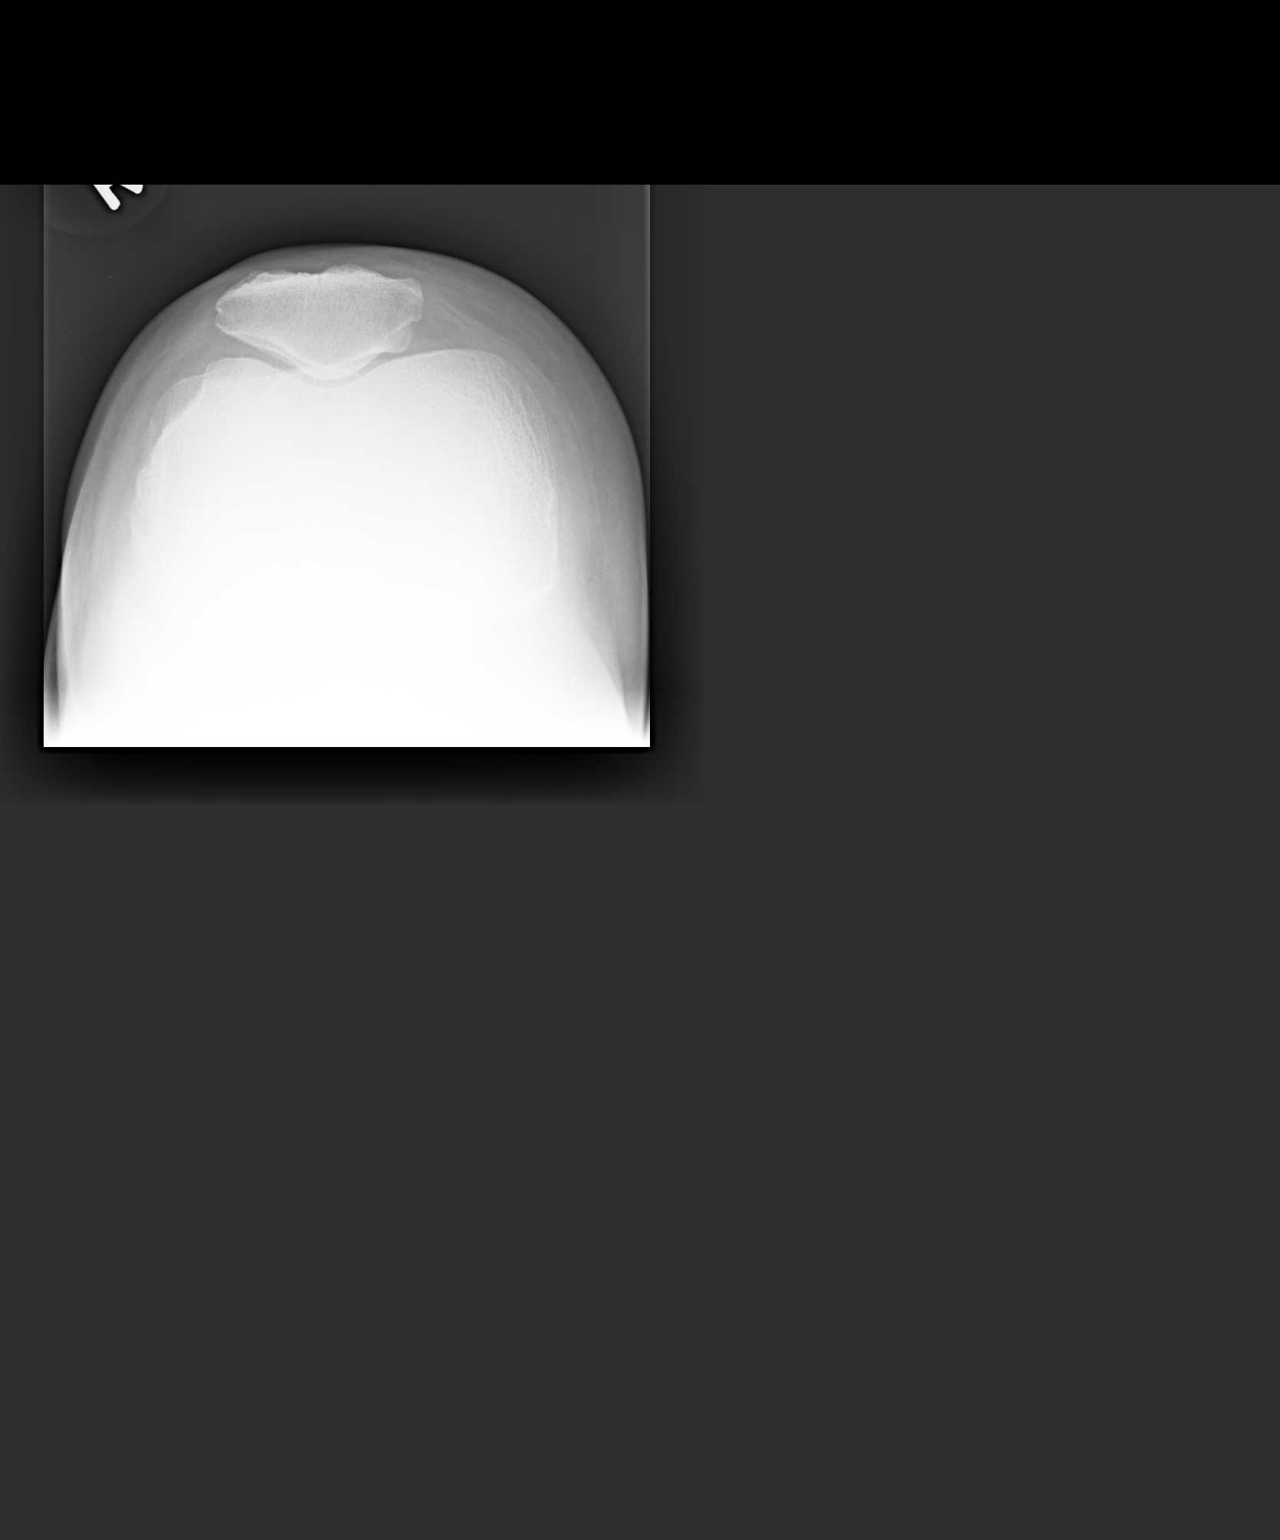

[1 of 1 positions shown; findings below may reference images not displayed]

FINDINGS: Mild joint space narrowing. Small joint effusion. No acute bony
abnormality. Specifically, no fracture, subluxation, or dislocation.
Soft tissues are intact.
IMPRESSION: No acute bony abnormality. Mild degenerative changes and small joint
effusion.

## 2015-10-12 IMAGING — CR DG KNEE COMPLETE 4+V*R*
3 series · 3 of 3 positions shown · non-contrast
Comparison: None.

CLINICAL DATA: Posterior and patellar right knee pain. Generalized
swelling.

EXAM:
RIGHT KNEE - COMPLETE 4+ VIEW

[w knee obl. right (1 of 2)]
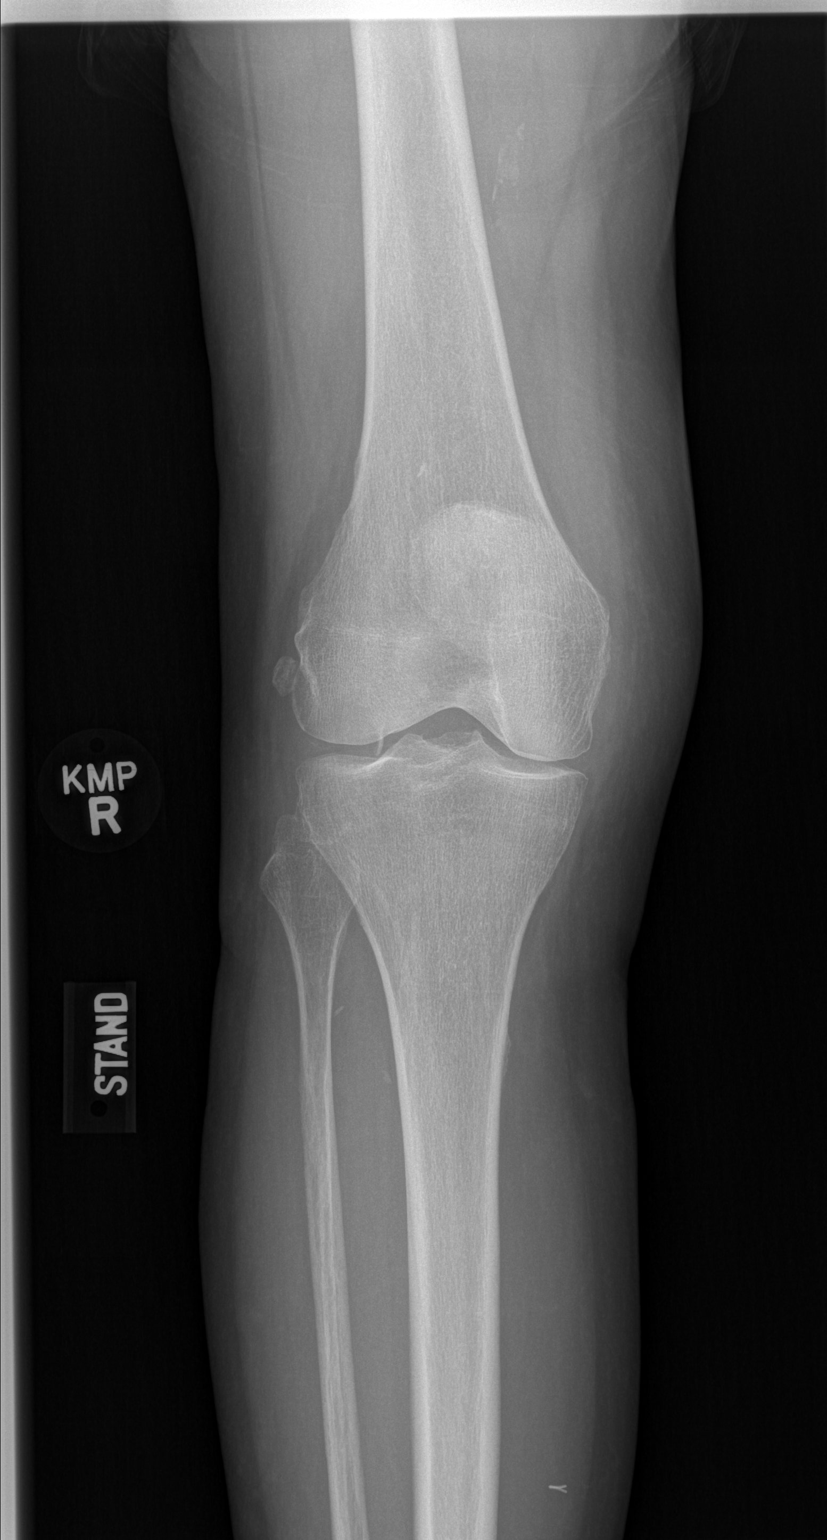

[w knee obl. right (2 of 2)]
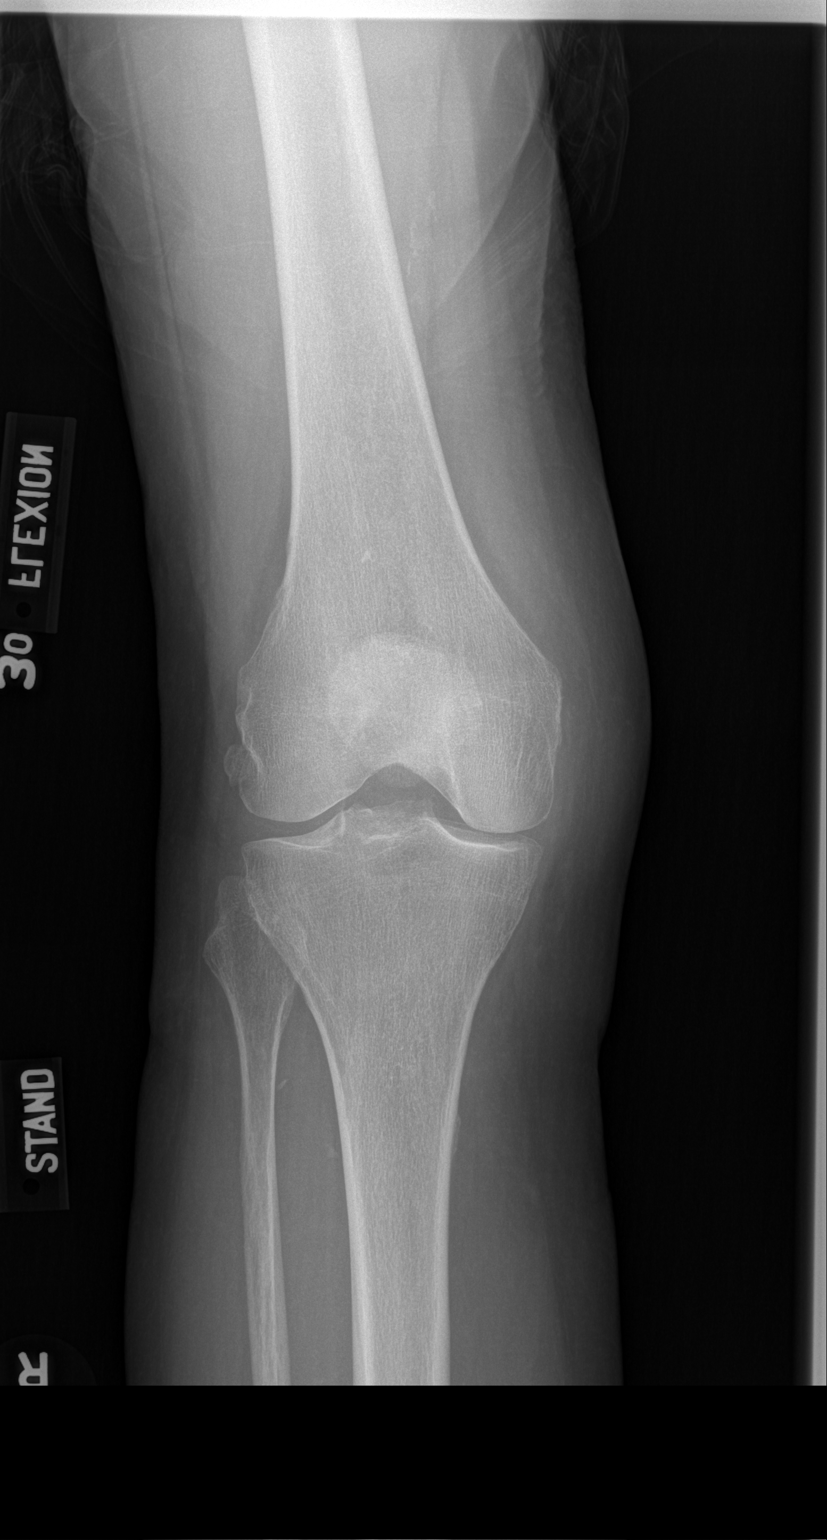

[w knee lat. right]
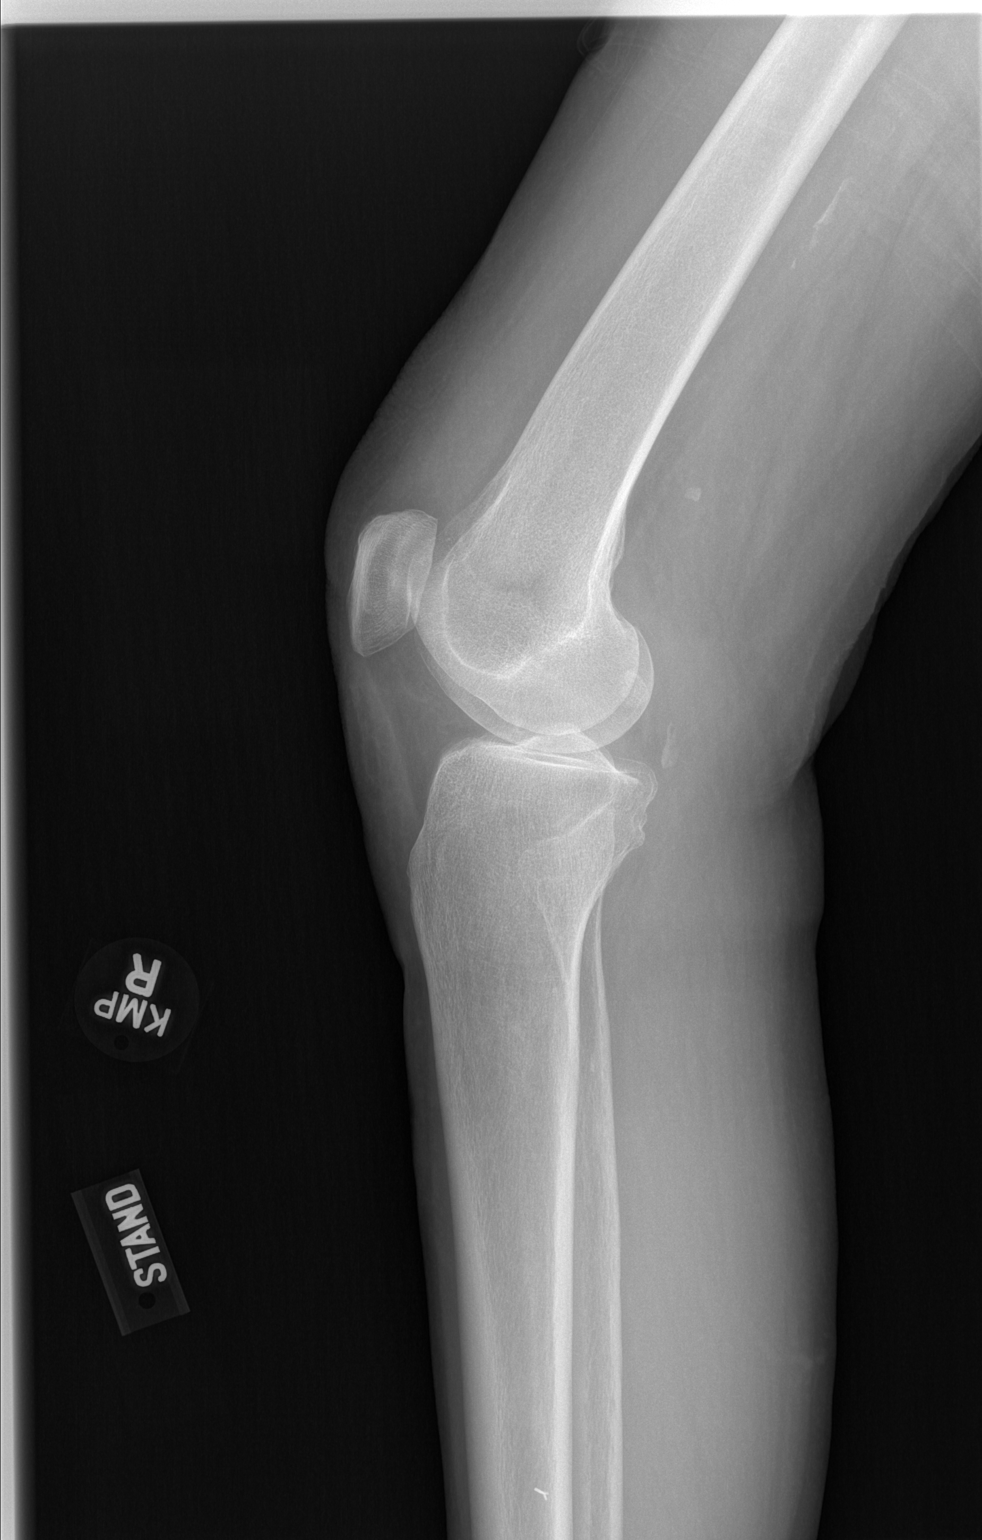

[3 of 3 positions shown; findings below may reference images not displayed]

FINDINGS: Mild joint space narrowing. Small joint effusion. No acute bony
abnormality. Specifically, no fracture, subluxation, or dislocation.
Soft tissues are intact.
IMPRESSION: No acute bony abnormality. Mild degenerative changes and small joint
effusion.

## 2015-10-13 ENCOUNTER — Encounter: Payer: Self-pay | Admitting: Internal Medicine

## 2015-10-13 ENCOUNTER — Non-Acute Institutional Stay: Payer: Medicare Other | Admitting: Internal Medicine

## 2015-10-13 VITALS — BP 120/84 | HR 60 | Temp 97.5°F | Wt 125.0 lb

## 2015-10-13 DIAGNOSIS — R252 Cramp and spasm: Secondary | ICD-10-CM | POA: Diagnosis not present

## 2015-10-13 DIAGNOSIS — M25561 Pain in right knee: Secondary | ICD-10-CM

## 2015-10-13 DIAGNOSIS — I2510 Atherosclerotic heart disease of native coronary artery without angina pectoris: Secondary | ICD-10-CM | POA: Diagnosis not present

## 2015-10-13 DIAGNOSIS — M25461 Effusion, right knee: Secondary | ICD-10-CM

## 2015-10-13 NOTE — Progress Notes (Addendum)
Patient ID: Susan Conway, female   DOB: 1929/02/06, 81 y.o.   MRN: [id number]   Location: [facility] Provider: Tayshawn Purnell L. Mariea Clonts, D.O., C.M.D.  Code Status: DNR Goals of Care: Advanced Directive information Does patient have an advance directive?: Yes, Type of Advance Directive: [place];Living will;Out of facility DNR (pink MOST or yellow form), Pre-existing out of facility DNR order (yellow form or pink MOST form): Yellow form placed in chart (order not valid for inpatient use)  Chief Complaint  Patient presents with  . Acute Visit    right knee pain, started on Saturday 10/09/15. X-rayed 10/12/15. Hurts to bend knee.    HPI: Patient is a 81 y.o. female seen in the office today for an acute visit due to right knee pain.   Does not recall any particular injury Began as she put up Christmas decorations over the weekend Was gradual Hurts to bend knee so swinging the leg out to walk Has had a little pain and at one time as given medication before a trip and got hives from the medication for it so never took more and knee didn't bother her again In knee and into calf and thigh a little bit.   More swollen yesterday and the day before than today Used one hydrocodone last night--was then able to sleep after a lot of adjusting to get comfortable  Review of Systems:  Review of Systems  Constitutional: Negative for fever and chills.  HENT: Positive for hearing loss.   Eyes:       Glasses  Respiratory: Negative for shortness of breath.   Musculoskeletal: Positive for joint pain. Negative for myalgias and falls.       Cramps better with coQ10  Neurological: Negative for dizziness.  Psychiatric/Behavioral: Negative for memory loss.    Past Medical History  Diagnosis Date  . PVC's (premature ventricular contractions)   . MI, old   . Osteoporosis   . Pain in joint, shoulder region 08/12/2012  . Other disorder of muscle, ligament, and fascia 04/08/2012  . Other  abnormal blood chemistry 04/08/2012  . Degeneration of lumbar or lumbosacral intervertebral disc 01/31/2012  . Cervicalgia 01/31/2012  . Pain in thoracic spine 01/31/2012  . Pathologic fracture of vertebrae ([place]) 01/22/2012  . Myalgia and myositis, unspecified 01/17/2012  . Dizziness and giddiness 11/08/2011    vertigo  . Rash and other nonspecific skin eruption 08/02/2011  . Pain in limb 01/11/2011  . Pain in joint, upper arm 12/26/2010  . Atrial fibrillation ([place]) 09/21/2010  . Long term (current) use of anticoagulants 09/2010  . Other specified cardiac dysrhythmias(427.89) 06/27/2010  . External hemorrhoids without mention of complication 123XX123  . Varicose veins of lower extremities 08/16/2009  . Ganglion of tendon sheath 08/16/2009  . Cramp of limb 08/16/2009  . Disturbance of skin sensation 08/2009  . Lumbago 07/2009  . Meralgia paresthetica 07/2007  . Closed fracture of lumbar vertebra without mention of spinal cord injury 07/17/2005  . Unspecified essential hypertension 07/17/1997  . Coronary atherosclerosis of native coronary artery 07/17/1997  . Senile osteoporosis 07/17/1993  . Bell's palsy 07/18/1979  . Conjunctiva disorder 12/26/2010  . Varicose veins of lower extremities 08/16/2009  . External hemorrhoids without mention of complication [id number]  . Other abnormal blood chemistry 2013    hyperglycemia  . Basal cell carcinoma 05/25/2014    Multiple removed by Dr. Danella Sensing in March 2015: right neck, left neck, scalp   . Actinic keratosis 05/25/2014  . Pain  in joint, ankle and foot 10/27/2013    Bilateral since 1998   . Leg cramp 10/27/2013    Most frequently the right leg.   Marland Kitchen Anxiety     Past Surgical History  Procedure Laterality Date  . Tonsillectomy  1940's  . Coronary artery bypass graft  1998    x2; LIMA to LAD; SVG to diagonal off bypass  . Hemorrhoid surgery  08/26/2012    Dr. Brantley Stage  . Skin biopsy  01/29/14    (R) neck; (R) scalp, 2 (L) neck; shave biopsy  Superficial basal cell carcinoma Dr. Danella Sensing    Allergies  Allergen Reactions  . Iodinated Diagnostic Agents     Severe nausea and also passed out  . Sulfamethoxazole-Trimethoprim     Rash   . Bactrim     rash  . Relafen [Nabumetone] Rash      Medication List       This list is accurate as of: 10/13/15  9:12 AM.  Always use your most recent med list.               aspirin 81 MG tablet  Take 81 mg by mouth daily. Take 1 tablet daily to help prevent stroke and heart attack.     atenolol 25 MG tablet  Commonly known as:  TENORMIN  Take 1/2 tablet by mouth once daily To treat high blood pressure and angina.     calcium carbonate 600 MG Tabs tablet  Commonly known as:  OS-CAL  Take 600 mg by mouth 2 (two) times daily with a meal.     cholecalciferol 1000 UNITS tablet  Commonly known as:  VITAMIN D  Take 1,000 Units by mouth daily. For Vitamin D supplement.     digoxin 0.125 MG tablet  Commonly known as:  LANOXIN  Take one tablet by mouth every other day to control heart rhythm     HYDROcodone-acetaminophen 7.5-750 MG tablet  Commonly known as:  VICODIN ES  One every 6 hours if needed for pain     loratadine 10 MG tablet  Commonly known as:  CLARITIN  Take 10 mg by mouth daily as needed for allergies.     LORazepam 1 MG tablet  Commonly known as:  ATIVAN  TAKE 1 TABLET BY MOUTH AT BEDTIME TO DECREASE ANXIETY AND HELP WITH SLEEP     nystatin-triamcinolone cream  Commonly known as:  MYCOLOG II  Apply 1 application topically 2 (two) times daily as needed (left buttock).     PRADAXA 150 MG Caps capsule  Generic drug:  dabigatran  TAKE ONE CAPSULE BY MOUTH TWICE A DAY FOR ANTICOAGULATION     simvastatin 20 MG tablet  Commonly known as:  ZOCOR  Take one tablet by mouth once daily for cholesterol        Health Maintenance  Topic Date Due  . INFLUENZA VACCINE  06/06/2016  . PNA vac Low Risk Adult (2 of 2 - PPSV23) 09/21/2016  . TETANUS/TDAP  11/06/2017   . DEXA SCAN  Completed  . ZOSTAVAX  Completed    Physical Exam: Filed Vitals:   10/13/15 0907  BP: 120/84  Pulse: 60  Temp: 97.5 F (36.4 C)  TempSrc: Oral  Weight: 125 lb (56.7 kg)  SpO2: 90%   Body mass index is 20.8 kg/(m^2). Physical Exam  Constitutional: She is oriented to person, place, and time. She appears well-developed and well-nourished. No distress.  Musculoskeletal: She exhibits tenderness.  Right posterior medial knee quite  tender and worse with medial movement of lower leg; no difficulty with compression of foot in prone position; small medial effusion visible; is keeping her right leg straight when walking and swinging it outward b/c it hurts most when she bends it when taking steps  Neurological: She is alert and oriented to person, place, and time.  Skin: Skin is warm and dry.  Small bruise lateral right knee, but pt does not recall any injury, is on pradaxa and gets these regularly without knowing    Labs reviewed: Basic Metabolic Panel:  Recent Labs  09/14/15  NA 142  K 4.2  BUN 19  CREATININE 0.9  TSH 3.80   Liver Function Tests:  Recent Labs  09/14/15  AST 20  ALT 11  ALKPHOS 63   No results for input(s): LIPASE, AMYLASE in the last 8760 hours. No results for input(s): AMMONIA in the last 8760 hours. CBC:  Recent Labs  09/14/15  WBC 6.0  HGB 13.5  HCT 39  PLT 127*   Lipid Panel:  Recent Labs  09/14/15  CHOL 134  HDL 67  LDLCALC 67  TRIG 52  Procedures since last visit: Right knee xrays 10/12/15:  No acute bony abnormality, mild degenerative changes and small joint effusion.  Assessment/Plan 1. Right medial knee pain - is worse with walking and locally tender medially -suspect medial meniscal damage -xray did not show any baker's cyst - MR Knee Right Wo Contrast; Future --RICE method recommended for now and ace wrap placed   2. Joint effusion, knee, right -small -not enough to tap and pt does not have history of gout or  pseudogout and xrays did not really suggest this -suspect she turned her body and not her leg and injured the knee  3. Muscle cramps at night -improved with the coenzyme Q10 I suggested last time  Labs/tests ordered:  Orders Placed This Encounter  Procedures  . MR Knee Right Wo Contrast    Standing Status: Future     Number of Occurrences:      Standing Expiration Date: 12/13/2016    Order Specific Question:  Reason for Exam (SYMPTOM  OR DIAGNOSIS REQUIRED)    Answer:  right medial posterior knee pain, difficulty bearing weight    Order Specific Question:  Preferred imaging location?    Answer:  [id number]    Order Specific Question:  Does the patient have a pacemaker or implanted devices?    Answer:  No    Order Specific Question:  What is the patient's sedation requirement?    Answer:  No Sedation    Next appt:  03/22/2016-keep regular appt and return PRN also   Susan Conway L. Daniell Paradise, D.O. [facility] [address], [place] [address] Cell Phone (Mon-Fri 8am-5pm):  [phone] On Call:  [phone] & follow prompts after 5pm & weekends Office Phone:  [phone] Office Fax:  [phone]

## 2015-10-16 ENCOUNTER — Telehealth: Payer: Self-pay | Admitting: Cardiology

## 2015-10-16 NOTE — Telephone Encounter (Signed)
Pt has had rt knee pain and now has rt calf and ankle edema.  I asked her to see Nurse at [facility] or go to Urgent care to be evaluated.  She was agreeable.

## 2015-10-17 ENCOUNTER — Inpatient Hospital Stay (HOSPITAL_COMMUNITY)
Admission: EM | Admit: 2015-10-17 | Discharge: 2015-10-20 | DRG: 394 | Disposition: A | Discharge status: Home Health | Payer: Medicare Other | Attending: Internal Medicine | Admitting: Internal Medicine

## 2015-10-17 ENCOUNTER — Encounter (HOSPITAL_COMMUNITY): Payer: Self-pay | Admitting: Emergency Medicine

## 2015-10-17 DIAGNOSIS — E860 Dehydration: Secondary | ICD-10-CM | POA: Diagnosis present

## 2015-10-17 DIAGNOSIS — I4891 Unspecified atrial fibrillation: Secondary | ICD-10-CM | POA: Diagnosis not present

## 2015-10-17 DIAGNOSIS — Z7982 Long term (current) use of aspirin: Secondary | ICD-10-CM | POA: Diagnosis not present

## 2015-10-17 DIAGNOSIS — K922 Gastrointestinal hemorrhage, unspecified: Secondary | ICD-10-CM | POA: Insufficient documentation

## 2015-10-17 DIAGNOSIS — K625 Hemorrhage of anus and rectum: Secondary | ICD-10-CM

## 2015-10-17 DIAGNOSIS — I252 Old myocardial infarction: Secondary | ICD-10-CM

## 2015-10-17 DIAGNOSIS — Z66 Do not resuscitate: Secondary | ICD-10-CM | POA: Diagnosis present

## 2015-10-17 DIAGNOSIS — E785 Hyperlipidemia, unspecified: Secondary | ICD-10-CM | POA: Diagnosis present

## 2015-10-17 DIAGNOSIS — Z79899 Other long term (current) drug therapy: Secondary | ICD-10-CM

## 2015-10-17 DIAGNOSIS — K921 Melena: Secondary | ICD-10-CM | POA: Diagnosis not present

## 2015-10-17 DIAGNOSIS — K648 Other hemorrhoids: Secondary | ICD-10-CM | POA: Diagnosis not present

## 2015-10-17 DIAGNOSIS — Z85828 Personal history of other malignant neoplasm of skin: Secondary | ICD-10-CM

## 2015-10-17 DIAGNOSIS — D62 Acute posthemorrhagic anemia: Secondary | ICD-10-CM | POA: Diagnosis present

## 2015-10-17 DIAGNOSIS — Z7901 Long term (current) use of anticoagulants: Secondary | ICD-10-CM | POA: Diagnosis not present

## 2015-10-17 DIAGNOSIS — K5900 Constipation, unspecified: Secondary | ICD-10-CM | POA: Diagnosis present

## 2015-10-17 DIAGNOSIS — Z951 Presence of aortocoronary bypass graft: Secondary | ICD-10-CM | POA: Diagnosis not present

## 2015-10-17 DIAGNOSIS — Z8249 Family history of ischemic heart disease and other diseases of the circulatory system: Secondary | ICD-10-CM

## 2015-10-17 DIAGNOSIS — M810 Age-related osteoporosis without current pathological fracture: Secondary | ICD-10-CM | POA: Diagnosis present

## 2015-10-17 DIAGNOSIS — I10 Essential (primary) hypertension: Secondary | ICD-10-CM | POA: Diagnosis present

## 2015-10-17 DIAGNOSIS — I2510 Atherosclerotic heart disease of native coronary artery without angina pectoris: Secondary | ICD-10-CM | POA: Diagnosis not present

## 2015-10-17 DIAGNOSIS — K5901 Slow transit constipation: Secondary | ICD-10-CM | POA: Diagnosis not present

## 2015-10-17 DIAGNOSIS — R339 Retention of urine, unspecified: Secondary | ICD-10-CM | POA: Diagnosis present

## 2015-10-17 DIAGNOSIS — K649 Unspecified hemorrhoids: Secondary | ICD-10-CM | POA: Diagnosis not present

## 2015-10-17 DIAGNOSIS — K626 Ulcer of anus and rectum: Secondary | ICD-10-CM | POA: Diagnosis present

## 2015-10-17 DIAGNOSIS — F419 Anxiety disorder, unspecified: Secondary | ICD-10-CM | POA: Diagnosis present

## 2015-10-17 LAB — CBC
HCT: 41.5 % (ref 36.0–46.0)
Hemoglobin: 13.4 g/dL (ref 12.0–15.0)
MCH: 28.4 pg (ref 26.0–34.0)
MCHC: 32.3 g/dL (ref 30.0–36.0)
MCV: 87.9 fL (ref 78.0–100.0)
PLATELETS: 144 10*3/uL — AB (ref 150–400)
RBC: 4.72 MIL/uL (ref 3.87–5.11)
RDW: 12.5 % (ref 11.5–15.5)
WBC: 12.2 10*3/uL — AB (ref 4.0–10.5)

## 2015-10-17 LAB — TYPE AND SCREEN
ABO/RH(D): O NEG
ANTIBODY SCREEN: NEGATIVE

## 2015-10-17 LAB — COMPREHENSIVE METABOLIC PANEL
ALK PHOS: 58 U/L (ref 38–126)
ALT: 12 U/L — AB (ref 14–54)
AST: 22 U/L (ref 15–41)
Albumin: 3.9 g/dL (ref 3.5–5.0)
Anion gap: 10 (ref 5–15)
BILIRUBIN TOTAL: 1 mg/dL (ref 0.3–1.2)
BUN: 23 mg/dL — ABNORMAL HIGH (ref 6–20)
CALCIUM: 9.4 mg/dL (ref 8.9–10.3)
CO2: 28 mmol/L (ref 22–32)
CREATININE: 0.83 mg/dL (ref 0.44–1.00)
Chloride: 101 mmol/L (ref 101–111)
Glucose, Bld: 125 mg/dL — ABNORMAL HIGH (ref 65–99)
Potassium: 4 mmol/L (ref 3.5–5.1)
Sodium: 139 mmol/L (ref 135–145)
TOTAL PROTEIN: 7.5 g/dL (ref 6.5–8.1)

## 2015-10-17 NOTE — ED Notes (Signed)
Pt is alert and oriented x 4, pleasant and calm.  POC hemoccult cancelled per MD Little, frank blood visible on rectal exam.  Pt has hx of hemorrhoids and reports that they have been "bothering" her for a few days.  Daughter present at bedside.  Pt is in no acute distress at this time.  Hx of A Fib with use of blood thinners.

## 2015-10-17 NOTE — ED Provider Notes (Signed)
CSN: [id number]     Arrival date & time 10/17/15  2111 History   First MD Initiated Contact with Patient 10/17/15 2129     Chief Complaint  Patient presents with  . Rectal Bleeding     (Consider location/radiation/quality/duration/timing/severity/associated sxs/prior Treatment) HPI Comments: 81 year old female with extensive past medical history including atrial fibrillation on pradaxa, CAD, HTN who presents with rectal bleeding. The patient reports that earlier today, she had an episode of heavy, frank rectal bleeding at approximately 12:30. She has had 2 more episodes of blood. She has not passed any stool. She reports a few days of constipation because of taking narcotics recently for right leg pain and swelling. She denies any abdominal pain or history of GI bleed. No fevers, vomiting, or recent illness.  The history is provided by the patient.    Past Medical History  Diagnosis Date  . PVC's (premature ventricular contractions)   . MI, old   . Osteoporosis   . Pain in joint, shoulder region 08/12/2012  . Other disorder of muscle, ligament, and fascia 04/08/2012  . Other abnormal blood chemistry 04/08/2012  . Degeneration of lumbar or lumbosacral intervertebral disc 01/31/2012  . Cervicalgia 01/31/2012  . Pain in thoracic spine 01/31/2012  . Pathologic fracture of vertebrae ([place]) 01/22/2012  . Myalgia and myositis, unspecified 01/17/2012  . Dizziness and giddiness 11/08/2011    vertigo  . Rash and other nonspecific skin eruption 08/02/2011  . Pain in limb 01/11/2011  . Pain in joint, upper arm 12/26/2010  . Atrial fibrillation ([place]) 09/21/2010  . Long term (current) use of anticoagulants 09/2010  . Other specified cardiac dysrhythmias(427.89) 06/27/2010  . External hemorrhoids without mention of complication 123XX123  . Varicose veins of lower extremities 08/16/2009  . Ganglion of tendon sheath 08/16/2009  . Cramp of limb 08/16/2009  . Disturbance of skin sensation 08/2009  . Lumbago  07/2009  . Meralgia paresthetica 07/2007  . Closed fracture of lumbar vertebra without mention of spinal cord injury 07/17/2005  . Unspecified essential hypertension 07/17/1997  . Coronary atherosclerosis of native coronary artery 07/17/1997  . Senile osteoporosis 07/17/1993  . Bell's palsy 07/18/1979  . Conjunctiva disorder 12/26/2010  . Varicose veins of lower extremities 08/16/2009  . External hemorrhoids without mention of complication A999333  . Other abnormal blood chemistry 2013    hyperglycemia  . Basal cell carcinoma 05/25/2014    Multiple removed by Dr. Danella Sensing in March 2015: right neck, left neck, scalp   . Actinic keratosis 05/25/2014  . Pain in joint, ankle and foot 10/27/2013    Bilateral since 1998   . Leg cramp 10/27/2013    Most frequently the right leg.   Marland Kitchen Anxiety    Past Surgical History  Procedure Laterality Date  . Tonsillectomy  1940's  . Coronary artery bypass graft  1998    x2; LIMA to LAD; SVG to diagonal off bypass  . Hemorrhoid surgery  08/26/2012    Dr. Brantley Stage  . Skin biopsy  01/29/14    (R) neck; (R) scalp, 2 (L) neck; shave biopsy Superficial basal cell carcinoma Dr. Danella Sensing   Family History  Problem Relation Age of Onset  . Heart attack Father   . Heart disease Father   . Hypertension Sister   . Heart disease Sister   . Heart disease Brother    Social History  Substance Use Topics  . Smoking status: Never Smoker   . Smokeless tobacco: Never Used  . Alcohol Use: Yes  Comment: occasional wine   OB History    No data available     Review of Systems 10 Systems reviewed and are negative for acute change except as noted in the HPI.    Allergies  Iodinated diagnostic agents; Sulfamethoxazole-trimethoprim; Bactrim; and Relafen  Home Medications   Prior to Admission medications   Medication Sig Start Date End Date Taking? Authorizing Provider  aspirin 81 MG tablet Take 81 mg by mouth daily. Take 1 tablet daily to help prevent  stroke and heart attack.   Yes Historical Provider, MD  atenolol (TENORMIN) 25 MG tablet Take 1/2 tablet by mouth once daily To treat high blood pressure and angina. 07/06/15  Yes Tiffany L Reed, DO  calcium carbonate (OS-CAL) 600 MG TABS Take 600 mg by mouth 2 (two) times daily with a meal.    Yes Historical Provider, MD  cholecalciferol (VITAMIN D) 1000 UNITS tablet Take 1,000 Units by mouth daily. For Vitamin D supplement.   Yes Historical Provider, MD  Coenzyme Q10 (Q-10 CO-ENZYME PO) Take 1 Dose by mouth daily.   Yes Historical Provider, MD  digoxin (LANOXIN) 0.125 MG tablet Take one tablet by mouth every other day to control heart rhythm Patient taking differently: Take one tablet by mouth every other day to control heart rhythm (at night) 03/05/15  Yes [name], MD  HYDROcodone-acetaminophen (VICODIN ES) 7.5-750 MG per tablet One every 6 hours if needed for pain Patient taking differently: Take one tablet by mouth every 6 hours if needed for pain 07/09/14  Yes Tiffany L Reed, DO  loratadine (CLARITIN) 10 MG tablet Take 10 mg by mouth daily as needed for allergies.    Yes Historical Provider, MD  LORazepam (ATIVAN) 1 MG tablet TAKE 1 TABLET BY MOUTH AT BEDTIME TO DECREASE ANXIETY AND HELP WITH SLEEP Patient taking differently: Take 1/2 tablet (0.5mg ) by mouth at bedtime 07/27/15  Yes Lauree Chandler, NP  nystatin-triamcinolone (MYCOLOG II) cream Apply 1 application topically 2 (two) times daily as needed (left buttock).   Yes Historical Provider, MD  PRADAXA 150 MG CAPS capsule TAKE ONE CAPSULE BY MOUTH TWICE A DAY FOR ANTICOAGULATION 04/12/15  Yes Tiffany L Reed, DO  simvastatin (ZOCOR) 20 MG tablet Take one tablet by mouth once daily for cholesterol Patient taking differently: Take one tablet by mouth once daily for cholesterol at night 07/06/15  Yes Tiffany L Reed, DO   BP 155/99 mmHg  Pulse 97  Temp(Src) 98.7 F (37.1 C) (Oral)  Resp 16  Ht 5\' 6"  (1.676 m)  Wt 125 lb (56.7 kg)  BMI  20.19 kg/m2  SpO2 95% Physical Exam  Constitutional: She is oriented to person, place, and time. She appears well-developed and well-nourished. No distress.  HENT:  Head: Normocephalic and atraumatic.  Moist mucous membranes  Eyes: Conjunctivae are normal. Pupils are equal, round, and reactive to light.  Neck: Neck supple.  Cardiovascular: Normal rate, regular rhythm and normal heart sounds.   No murmur heard. Pulmonary/Chest: Effort normal and breath sounds normal.  Abdominal: Soft. Bowel sounds are normal. She exhibits no distension. There is no tenderness.  Genitourinary:  Small non-thrombosed external hemorrhoids, BRB oozing from anus on rectal exam, stool in rectal vault  Musculoskeletal:  1+ pitting edema RLE w/ mild tenderness of R lower leg, no deformity or skin changes noted  Neurological: She is alert and oriented to person, place, and time.  Fluent speech  Skin: Skin is warm and dry. No rash noted. No erythema.  Psychiatric: She has a normal mood and affect. Judgment normal.  Nursing note and vitals reviewed.   ED Course  Procedures (including critical care time) Labs Review Labs Reviewed  COMPREHENSIVE METABOLIC PANEL - Abnormal; Notable for the following:    Glucose, Bld 125 (*)    BUN 23 (*)    ALT 12 (*)    All other components within normal limits  CBC - Abnormal; Notable for the following:    WBC 12.2 (*)    Platelets 144 (*)    All other components within normal limits  TYPE AND SCREEN  ABO/RH    Imaging Review No results found. I have personally reviewed and evaluated these lab results as part of my medical decision-making.   EKG Interpretation None      MDM   Final diagnoses:  Rectal bleeding  Atrial fibrillation, unspecified type ([place])   Patient with history of atrial fibrillation on Pradaxa who presents with 3 episodes of large volume rectal bleeding of bright red blood without any reports of abdominal pain or vomiting. Patient  well-appearing with reassuring vital signs at presentation. No abdominal tenderness. She had oozing of frank bleeding on rectal exam which was not ongoing or large volume. Placed patient on monitoring and obtained above labs including type and screen. 2 IVs placed. H/H is normal, however patient has had ongoing bleeding in the emergency department when voiding. I discussed with the on-call gastroenterologist, Dr. Germain Osgood, regarding presentation. They will see the patient in consultation and recommended medicine admission with holding Pradaxa and monitoring for any signs of instability. Discussed w/ Dr. Arnoldo Morale, [facility] hospitalist, and patient admitted to stepdown unit for further care.  Sharlett Iles, MD 10/17/15 [phone]

## 2015-10-17 NOTE — ED Notes (Signed)
Pt ambulated to restroom without difficulty; urine specimen not obtained due to contamination with rectal blood, frankly visible in toilet.  Blood is both bright red and dark red and copious. MD notified.

## 2015-10-17 NOTE — ED Notes (Signed)
Pt presents from [facility] via EMS c/o heavy, frank rectal bleeding x 3 since 1230 this afternoon.  No abdominal pain.  Also c/o pain in right knee and right shoulder.  Took hydrocodone Friday and Saturday, no normal BM since then.  No acute distress.

## 2015-10-17 NOTE — ED Notes (Signed)
Bed: WA07 Expected date:  Expected time:  Means of arrival:  Comments: ems 

## 2015-10-18 ENCOUNTER — Inpatient Hospital Stay (HOSPITAL_COMMUNITY): Payer: Medicare Other

## 2015-10-18 DIAGNOSIS — Z7901 Long term (current) use of anticoagulants: Secondary | ICD-10-CM

## 2015-10-18 DIAGNOSIS — I2510 Atherosclerotic heart disease of native coronary artery without angina pectoris: Secondary | ICD-10-CM

## 2015-10-18 DIAGNOSIS — K5900 Constipation, unspecified: Secondary | ICD-10-CM | POA: Insufficient documentation

## 2015-10-18 DIAGNOSIS — K922 Gastrointestinal hemorrhage, unspecified: Secondary | ICD-10-CM

## 2015-10-18 DIAGNOSIS — E785 Hyperlipidemia, unspecified: Secondary | ICD-10-CM

## 2015-10-18 LAB — PROTIME-INR
INR: 1.31 (ref 0.00–1.49)
Prothrombin Time: 16.4 seconds — ABNORMAL HIGH (ref 11.6–15.2)

## 2015-10-18 LAB — URINALYSIS, ROUTINE W REFLEX MICROSCOPIC
BILIRUBIN URINE: NEGATIVE
Glucose, UA: NEGATIVE mg/dL
KETONES UR: 15 mg/dL — AB
Leukocytes, UA: NEGATIVE
NITRITE: NEGATIVE
Protein, ur: NEGATIVE mg/dL
Specific Gravity, Urine: 1.022 (ref 1.005–1.030)
pH: 5.5 (ref 5.0–8.0)

## 2015-10-18 LAB — CBC
HEMATOCRIT: 36 % (ref 36.0–46.0)
Hemoglobin: 11.8 g/dL — ABNORMAL LOW (ref 12.0–15.0)
MCH: 29.4 pg (ref 26.0–34.0)
MCHC: 32.8 g/dL (ref 30.0–36.0)
MCV: 89.6 fL (ref 78.0–100.0)
PLATELETS: 137 10*3/uL — AB (ref 150–400)
RBC: 4.02 MIL/uL (ref 3.87–5.11)
RDW: 12.8 % (ref 11.5–15.5)
WBC: 11 10*3/uL — ABNORMAL HIGH (ref 4.0–10.5)

## 2015-10-18 LAB — HEMOGLOBIN AND HEMATOCRIT, BLOOD
HCT: 36 % (ref 36.0–46.0)
HCT: 34.2 % — ABNORMAL LOW (ref 36.0–46.0)
HEMOGLOBIN: 11.8 g/dL — AB (ref 12.0–15.0)
Hemoglobin: 10.9 g/dL — ABNORMAL LOW (ref 12.0–15.0)

## 2015-10-18 LAB — BASIC METABOLIC PANEL
Anion gap: 8 (ref 5–15)
BUN: 21 mg/dL — AB (ref 6–20)
CHLORIDE: 103 mmol/L (ref 101–111)
CO2: 28 mmol/L (ref 22–32)
CREATININE: 0.79 mg/dL (ref 0.44–1.00)
Calcium: 8.9 mg/dL (ref 8.9–10.3)
GFR calc Af Amer: >60 mL/min (ref >60)
GFR calc non Af Amer: >60 mL/min (ref >60)
Glucose, Bld: 118 mg/dL — ABNORMAL HIGH (ref 65–99)
Potassium: 3.9 mmol/L (ref 3.5–5.1)
Sodium: 139 mmol/L (ref 135–145)

## 2015-10-18 LAB — URINE MICROSCOPIC-ADD ON

## 2015-10-18 LAB — ABO/RH: ABO/RH(D): O NEG

## 2015-10-18 LAB — MRSA PCR SCREENING: MRSA by PCR: NEGATIVE

## 2015-10-18 IMAGING — DX DG ABD PORTABLE 1V
1 series · 1 of 1 positions shown · non-contrast
Comparison: Lumbar spine radiographs [DATE].

CLINICAL DATA: Constipation.  No bowel movement for 3 days.

EXAM:
PORTABLE ABDOMEN - 1 VIEW

[abdomen kub]
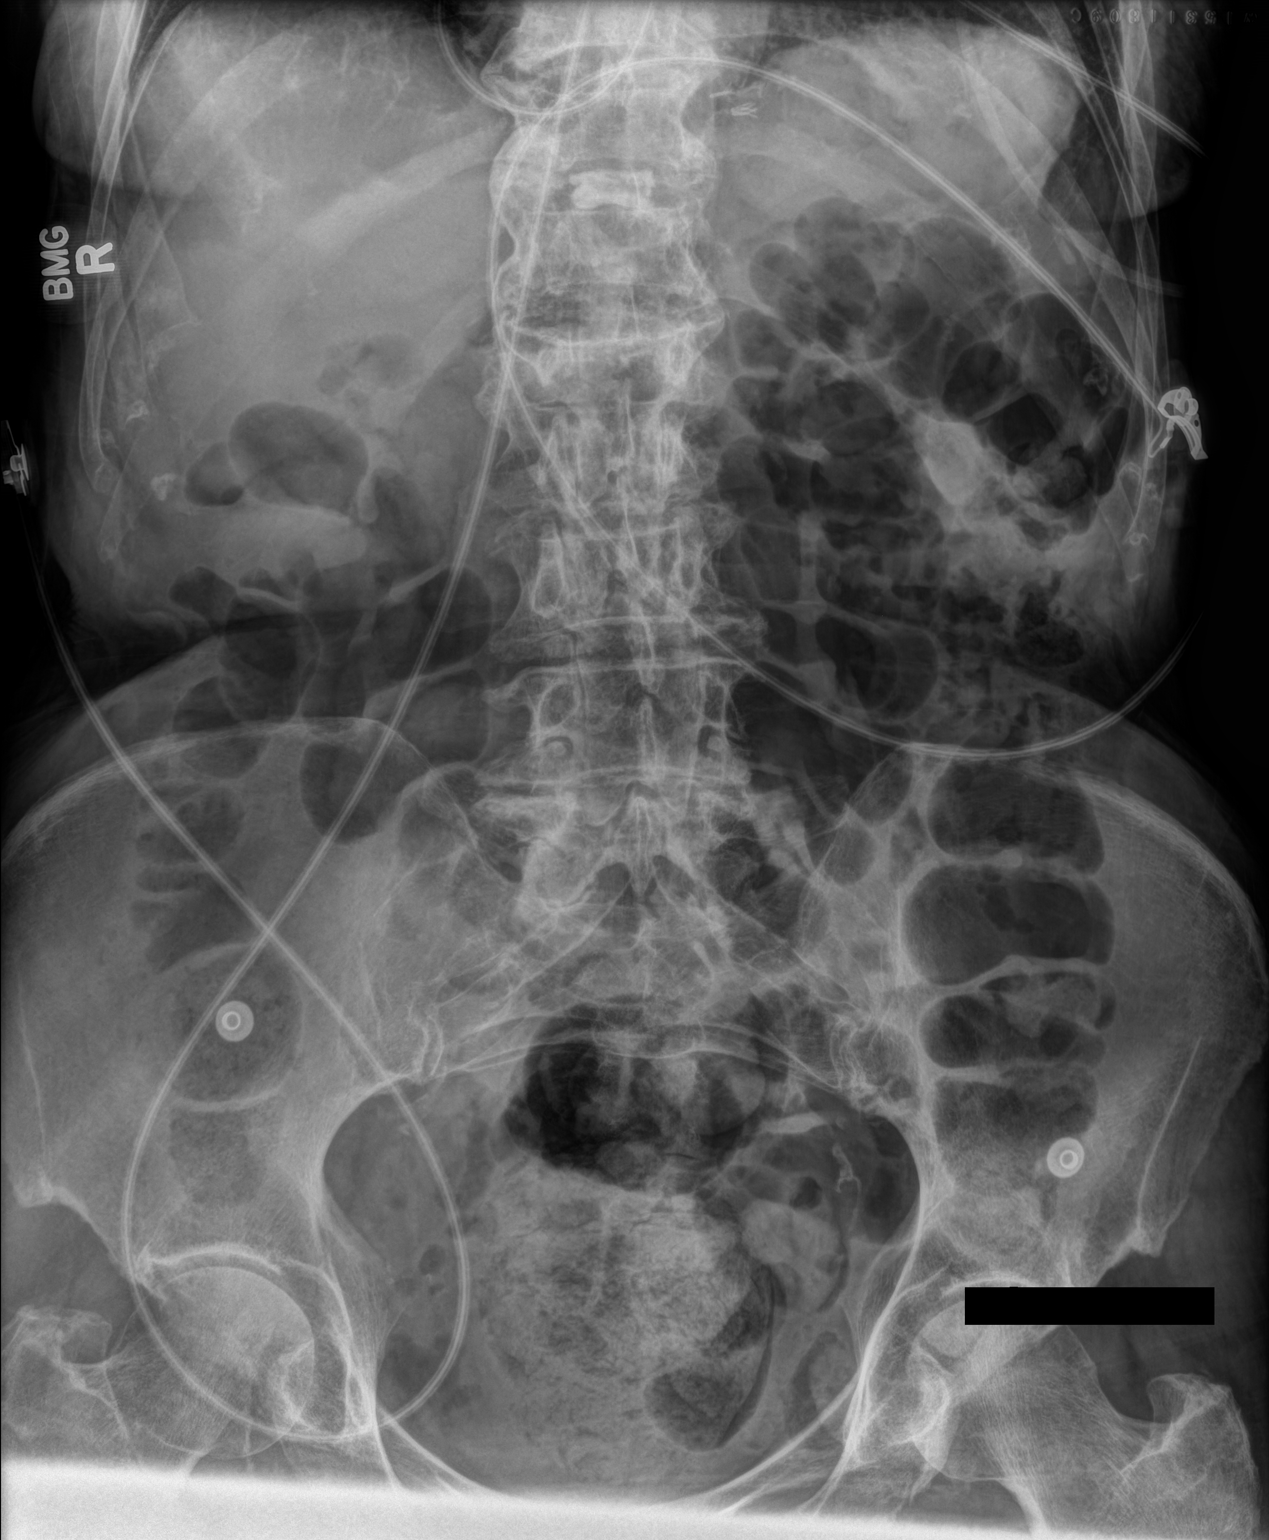

[1 of 1 positions shown; findings below may reference images not displayed]

FINDINGS: [67] hours. There is a moderate amount of stool within the rectal
vault. Colonic stool burden is otherwise within normal limits. There
is gas throughout the small and large bowel without significant
bowel distension or wall thickening. There is no supine evidence of
free intraperitoneal air. Chronic L1 fracture, diffuse spondylosis
and atherosclerosis noted.
IMPRESSION: Prominent rectal stool.  No evidence of obstruction.

## 2015-10-18 MED ORDER — PANTOPRAZOLE SODIUM 40 MG IV SOLR
40.0000 mg | INTRAVENOUS | Status: DC
  Administered 2015-10-18 – 2015-10-19 (×3): 40 mg via INTRAVENOUS
  Filled 2015-10-18 (×3): qty 40

## 2015-10-18 MED ORDER — SIMVASTATIN 20 MG PO TABS
20.0000 mg | ORAL_TABLET | Freq: Every day | ORAL | Status: DC
  Administered 2015-10-18 – 2015-10-19 (×2): 20 mg via ORAL
  Filled 2015-10-18: qty 2
  Filled 2015-10-18: qty 1
  Filled 2015-10-18: qty 2

## 2015-10-18 MED ORDER — LORAZEPAM 0.5 MG PO TABS
0.5000 mg | ORAL_TABLET | Freq: Every day | ORAL | Status: DC
  Administered 2015-10-18 – 2015-10-19 (×2): 0.5 mg via ORAL
  Filled 2015-10-18 (×2): qty 1

## 2015-10-18 MED ORDER — ONDANSETRON HCL 4 MG/2ML IJ SOLN: 4.0000 mg | Freq: Four times a day (QID) | INTRAMUSCULAR | Status: DC | PRN

## 2015-10-18 MED ORDER — LORAZEPAM 0.5 MG PO TABS: 0.5000 mg | ORAL_TABLET | Freq: Two times a day (BID) | ORAL | Status: DC | PRN

## 2015-10-18 MED ORDER — DICLOFENAC SODIUM 1 % TD GEL
2.0000 g | Freq: Four times a day (QID) | TRANSDERMAL | Status: DC
  Administered 2015-10-18 – 2015-10-20 (×8): 2 g via TOPICAL
  Filled 2015-10-18: qty 100

## 2015-10-18 MED ORDER — ACETAMINOPHEN 650 MG RE SUPP: 650.0000 mg | Freq: Four times a day (QID) | RECTAL | Status: DC | PRN

## 2015-10-18 MED ORDER — ALUM & MAG HYDROXIDE-SIMETH 200-200-20 MG/5ML PO SUSP: 30.0000 mL | Freq: Four times a day (QID) | ORAL | Status: DC | PRN

## 2015-10-18 MED ORDER — PEG-KCL-NACL-NASULF-NA ASC-C 100 G PO SOLR
0.5000 | Freq: Once | ORAL | Status: AC
  Administered 2015-10-18: 100 g via ORAL
  Filled 2015-10-18: qty 1

## 2015-10-18 MED ORDER — BISACODYL 10 MG RE SUPP: 10.0000 mg | Freq: Once | RECTAL | Status: DC

## 2015-10-18 MED ORDER — VITAMINS A & D EX OINT
TOPICAL_OINTMENT | CUTANEOUS | Status: AC
  Filled 2015-10-18: qty 5

## 2015-10-18 MED ORDER — PEG-KCL-NACL-NASULF-NA ASC-C 100 G PO SOLR
0.5000 | Freq: Once | ORAL | Status: AC
  Administered 2015-10-19: 100 g via ORAL

## 2015-10-18 MED ORDER — DIGOXIN 0.0625 MG HALF TABLET
0.0625 mg | ORAL_TABLET | Freq: Every day | ORAL | Status: DC
  Administered 2015-10-18 – 2015-10-20 (×3): 0.0625 mg via ORAL
  Filled 2015-10-18: qty 0.5
  Filled 2015-10-18: qty 1
  Filled 2015-10-18 (×2): qty 0.5

## 2015-10-18 MED ORDER — SODIUM CHLORIDE 0.9 % IV BOLUS (SEPSIS)
250.0000 mL | Freq: Once | INTRAVENOUS | Status: AC
  Administered 2015-10-18: 250 mL via INTRAVENOUS

## 2015-10-18 MED ORDER — HYDROMORPHONE HCL 1 MG/ML IJ SOLN: 0.5000 mg | INTRAMUSCULAR | Status: DC | PRN

## 2015-10-18 MED ORDER — ATENOLOL 12.5 MG HALF TABLET
12.5000 mg | ORAL_TABLET | Freq: Every day | ORAL | Status: DC
  Administered 2015-10-18 – 2015-10-20 (×3): 12.5 mg via ORAL
  Filled 2015-10-18 (×4): qty 1

## 2015-10-18 MED ORDER — LORATADINE 10 MG PO TABS
10.0000 mg | ORAL_TABLET | Freq: Every day | ORAL | Status: DC | PRN
  Filled 2015-10-18: qty 1

## 2015-10-18 MED ORDER — GLYCERIN (LAXATIVE) 2.1 G RE SUPP
1.0000 | Freq: Once | RECTAL | Status: AC
  Administered 2015-10-18: 1 via RECTAL
  Filled 2015-10-18: qty 1

## 2015-10-18 MED ORDER — OXYCODONE HCL 5 MG PO TABS: 5.0000 mg | ORAL_TABLET | ORAL | Status: DC | PRN

## 2015-10-18 MED ORDER — HYDRALAZINE HCL 20 MG/ML IJ SOLN
5.0000 mg | Freq: Once | INTRAMUSCULAR | Status: AC
  Administered 2015-10-18: 5 mg via INTRAVENOUS
  Filled 2015-10-18: qty 1

## 2015-10-18 MED ORDER — SODIUM CHLORIDE 0.9 % IV SOLN
INTRAVENOUS | Status: DC
  Administered 2015-10-18 – 2015-10-19 (×4): via INTRAVENOUS

## 2015-10-18 MED ORDER — ACETAMINOPHEN 325 MG PO TABS: 650.0000 mg | ORAL_TABLET | Freq: Four times a day (QID) | ORAL | Status: DC | PRN

## 2015-10-18 MED ORDER — PEG-KCL-NACL-NASULF-NA ASC-C 100 G PO SOLR: 1.0000 | Freq: Once | ORAL | Status: DC

## 2015-10-18 MED ORDER — HYDROCODONE-ACETAMINOPHEN 7.5-325 MG PO TABS: 1.0000 | ORAL_TABLET | Freq: Four times a day (QID) | ORAL | Status: DC | PRN

## 2015-10-18 MED ORDER — ONDANSETRON HCL 4 MG PO TABS: 4.0000 mg | ORAL_TABLET | Freq: Four times a day (QID) | ORAL | Status: DC | PRN

## 2015-10-18 NOTE — Consult Note (Signed)
Referring Provider: [facility] Primary Care Physician:  Hollace Kinnier, DO Primary Gastroenterologist:  unassigned  Reason for Consultation:  GI bleed     HPI: Susan Conway is a 81 y.o. female who relocated to [place] from [place], [place] several years ago. She resides at [facility]. She has history of atrial fibrillation for which she has been on per Dr. Judson Roch for about a year and a half. Prior to per Doxil, she had been on warfarin for several years. She has been using pain medications for right knee and right shoulder pain and became constipated last week. She went 3 days without a bowel movement and so she is used Multimedia programmer lax and Senokot with little relief. She then tried a fleets enema and states no stool came out but shortly thereafter she began to have bloody bowel movements. She reports that on Sunday, December 11 in the early afternoon she felt she had a bowel movement and passed bright red blood that filled the toilet. She had 4 episodes like this, and then called the nurse at [facility]. She was evaluated by the nurse and advised to come to the emergency room. EMS was contacted and she was transferred to the emergency room. The patient reports that she had 2 bloody bowel movements in the emergency room. She states that since in her room she has had 2 dark red bowel movements, the last of which was around 6:00 or 6:30 this morning. She has had no further bowel movements since, but she feels some rectal pressure. She reports that she has had 2 colonoscopies in [place], [place], the last of which was in 2009 or 2010. She says she was told her colonoscopies were normal. She is not aware of a history of diverticular disease or colon polyps. She currently is not having abdominal pain but has some suprapubic and rectal pressure "like I need to have a bowel movement". Prior to this admission her appetite has been good but she states she has lost about 15 pounds in the last  several years unintentionally. In the emergency room her initial hemoglobin was noted to be 13.4. Her atrial fibrillation was noted to have a rate in the 110s in the emergency room.   Past Medical History  Diagnosis Date  . PVC's (premature ventricular contractions)   . MI, old   . Osteoporosis   . Pain in joint, shoulder region 08/12/2012  . Other disorder of muscle, ligament, and fascia 04/08/2012  . Other abnormal blood chemistry 04/08/2012  . Degeneration of lumbar or lumbosacral intervertebral disc 01/31/2012  . Cervicalgia 01/31/2012  . Pain in thoracic spine 01/31/2012  . Pathologic fracture of vertebrae ([place]) 01/22/2012  . Myalgia and myositis, unspecified 01/17/2012  . Dizziness and giddiness 11/08/2011    vertigo  . Rash and other nonspecific skin eruption 08/02/2011  . Pain in limb 01/11/2011  . Pain in joint, upper arm 12/26/2010  . Atrial fibrillation ([place]) 09/21/2010  . Long term (current) use of anticoagulants 09/2010  . Other specified cardiac dysrhythmias(427.89) 06/27/2010  . External hemorrhoids without mention of complication 123XX123  . Varicose veins of lower extremities 08/16/2009  . Ganglion of tendon sheath 08/16/2009  . Cramp of limb 08/16/2009  . Disturbance of skin sensation 08/2009  . Lumbago 07/2009  . Meralgia paresthetica 07/2007  . Closed fracture of lumbar vertebra without mention of spinal cord injury 07/17/2005  . Unspecified essential hypertension 07/17/1997  . Coronary atherosclerosis of native coronary artery 07/17/1997  . Senile  osteoporosis 07/17/1993  . Bell's palsy 07/18/1979  . Conjunctiva disorder 12/26/2010  . Varicose veins of lower extremities 08/16/2009  . External hemorrhoids without mention of complication [id number]  . Other abnormal blood chemistry 2013    hyperglycemia  . Basal cell carcinoma 05/25/2014    Multiple removed by Dr. Danella Sensing in March 2015: right neck, left neck, scalp   . Actinic keratosis 05/25/2014  . Pain in joint, ankle  and foot 10/27/2013    Bilateral since 1998   . Leg cramp 10/27/2013    Most frequently the right leg.   Marland Kitchen Anxiety     Past Surgical History  Procedure Laterality Date  . Tonsillectomy  1940's  . Coronary artery bypass graft  1998    x2; LIMA to LAD; SVG to diagonal off bypass  . Hemorrhoid surgery  08/26/2012    Dr. Brantley Stage  . Skin biopsy  01/29/14    (R) neck; (R) scalp, 2 (L) neck; shave biopsy Superficial basal cell carcinoma Dr. Danella Sensing    Prior to Admission medications   Medication Sig Start Date End Date Taking? Authorizing Provider  aspirin 81 MG tablet Take 81 mg by mouth daily. Take 1 tablet daily to help prevent stroke and heart attack.   Yes Historical Provider, MD  atenolol (TENORMIN) 25 MG tablet Take 1/2 tablet by mouth once daily To treat high blood pressure and angina. 07/06/15  Yes Tiffany L Reed, DO  calcium carbonate (OS-CAL) 600 MG TABS Take 600 mg by mouth 2 (two) times daily with a meal.    Yes Historical Provider, MD  cholecalciferol (VITAMIN D) 1000 UNITS tablet Take 1,000 Units by mouth daily. For Vitamin D supplement.   Yes Historical Provider, MD  Coenzyme Q10 (Q-10 CO-ENZYME PO) Take 1 Dose by mouth daily.   Yes Historical Provider, MD  digoxin (LANOXIN) 0.125 MG tablet Take one tablet by mouth every other day to control heart rhythm Patient taking differently: Take one tablet by mouth every other day to control heart rhythm (at night) 03/05/15  Yes [name], MD  HYDROcodone-acetaminophen (VICODIN ES) 7.5-750 MG per tablet One every 6 hours if needed for pain Patient taking differently: Take one tablet by mouth every 6 hours if needed for pain 07/09/14  Yes Tiffany L Reed, DO  loratadine (CLARITIN) 10 MG tablet Take 10 mg by mouth daily as needed for allergies.    Yes Historical Provider, MD  LORazepam (ATIVAN) 1 MG tablet TAKE 1 TABLET BY MOUTH AT BEDTIME TO DECREASE ANXIETY AND HELP WITH SLEEP Patient taking differently: Take 1/2 tablet (0.5mg ) by  mouth at bedtime 07/27/15  Yes Lauree Chandler, NP  nystatin-triamcinolone (MYCOLOG II) cream Apply 1 application topically 2 (two) times daily as needed (left buttock).   Yes Historical Provider, MD  PRADAXA 150 MG CAPS capsule TAKE ONE CAPSULE BY MOUTH TWICE A DAY FOR ANTICOAGULATION 04/12/15  Yes Tiffany L Reed, DO  simvastatin (ZOCOR) 20 MG tablet Take one tablet by mouth once daily for cholesterol Patient taking differently: Take one tablet by mouth once daily for cholesterol at night 07/06/15  Yes Tiffany L Reed, DO    Current Facility-Administered Medications  Medication Dose Route Frequency Provider Last Rate Last Dose  . 0.9 %  sodium chloride infusion   Intravenous Continuous Debbe Odea, MD 125 mL/hr at 10/18/15 0825    . acetaminophen (TYLENOL) tablet 650 mg  650 mg Oral Q6H PRN Theressa Millard, MD  Or  . acetaminophen (TYLENOL) suppository 650 mg  650 mg Rectal Q6H PRN Theressa Millard, MD      . alum & mag hydroxide-simeth (MAALOX/MYLANTA) 200-200-20 MG/5ML suspension 30 mL  30 mL Oral Q6H PRN Theressa Millard, MD      . atenolol (TENORMIN) tablet 12.5 mg  12.5 mg Oral Daily Theressa Millard, MD   12.5 mg at 10/18/15 0945  . diclofenac sodium (VOLTAREN) 1 % transdermal gel 2 g  2 g Topical QID Debbe Odea, MD   2 g at 10/18/15 0945  . digoxin (LANOXIN) tablet 0.0625 mg  0.0625 mg Oral Daily Harvette Evonnie Dawes, MD      . HYDROmorphone (DILAUDID) injection 0.5-1 mg  0.5-1 mg Intravenous Q3H PRN Theressa Millard, MD      . loratadine (CLARITIN) tablet 10 mg  10 mg Oral Daily PRN Theressa Millard, MD      . LORazepam (ATIVAN) tablet 0.5 mg  0.5 mg Oral QHS Debbe Odea, MD      . ondansetron (ZOFRAN) tablet 4 mg  4 mg Oral Q6H PRN Theressa Millard, MD       Or  . ondansetron (ZOFRAN) injection 4 mg  4 mg Intravenous Q6H PRN Theressa Millard, MD      . oxyCODONE (Oxy IR/ROXICODONE) immediate release tablet 5 mg  5 mg Oral Q4H PRN Theressa Millard, MD      .  pantoprazole (PROTONIX) injection 40 mg  40 mg Intravenous Q24H Theressa Millard, MD   40 mg at 10/18/15 0259  . simvastatin (ZOCOR) tablet 20 mg  20 mg Oral q1800 Theressa Millard, MD      . vitamin A & D ointment             Allergies as of 10/17/2015 - Review Complete 10/17/2015  Allergen Reaction Noted  . Iodinated diagnostic agents  06/15/2011  . Sulfamethoxazole-trimethoprim    . Bactrim  06/15/2011  . Relafen [nabumetone] Rash     Family History  Problem Relation Age of Onset  . Heart attack Father   . Heart disease Father   . Hypertension Sister   . Heart disease Sister   . Heart disease Brother     Social History   Social History  . Marital Status: Married    Spouse Name: N/A  . Number of Children: N/A  . Years of Education: N/A   Occupational History  . Retired Pharmacist, hospital    Social History Main Topics  . Smoking status: Never Smoker   . Smokeless tobacco: Never Used  . Alcohol Use: Yes     Comment: occasional wine  . Drug Use: No  . Sexual Activity: Not on file   Other Topics Concern  . Not on file   Social History Narrative   Living at [facility] since 2010   Married - Charles   Never smoked   Alcohol occasional wine   Exercise, house work    DNR , POA, Living Will    Review of Systems: Gen: Denies any fever, chills, sweats, anorexia, fatigue, weakness, malaise, and sleep disorder. Reports a 15 pound weight loss over the past several years. CV: Denies chest pain, angina, palpitations, syncope, orthopnea, PND, peripheral edema, and claudication. Resp: Denies dyspnea at rest, dyspnea with exercise, cough, sputum, wheezing, coughing up blood, and pleurisy. GI: Denies vomiting blood, jaundice, and fecal incontinence.   Denies dysphagia or odynophagia. Admits to mild lower crampy abdominal pain that feels  like she has to have a bowel movement. GU : Has had trouble with urinary retention. MS: Complains of right shoulder pain and right knee pain Derm:  Denies rash, itching, dry skin, hives, moles, warts, or unhealing ulcers.  Psych: Denies depression, anxiety, memory loss, suicidal ideation, hallucinations, paranoia, and confusion. Heme: Denies bruising, and enlarged lymph nodes. Neuro:  Denies any headaches, dizziness, paresthesias. Endo:  Denies any problems with DM, thyroid, adrenal function.  Physical Exam: Vital signs in last 24 hours: Temp:  [98 F (36.7 C)-99.2 F (37.3 C)] 99.2 F (37.3 C) (12/12 0800) Pulse Rate:  [94-184] 106 (12/12 0800) Resp:  [16-24] 17 (12/12 0800) BP: (141-166)/(66-106) 148/66 mmHg (12/12 0800) SpO2:  [92 %-99 %] 93 % (12/12 0800) Weight:  [125 lb (56.7 kg)] 125 lb (56.7 kg) (12/11 2122) Last BM Date: 10/17/15 General:   Alert,  Well-developed, well-nourished, pleasant and cooperative in NAD Head:  Normocephalic and atraumatic. Eyes:  Sclera clear, no icterus.   Conjunctiva pink. Ears:  Normal auditory acuity. Nose:  No deformity, discharge,  or lesions. Mouth:  No deformity or lesions.   Neck:  Supple; no masses or thyromegaly. Lungs:  Clear throughout to auscultation.     Heart:  Regular rate and rhythm; no murmurs, clicks, rubs,  or gallops. Abdomen:  Soft,nontender, BS active,nonpalp mass or hsm.   Rectal:  External hemorrhoids noted, dried blood in perirectal area. Msk:  Symmetrical without gross deformities. . Pulses:  Normal pulses noted. Extremities: 1+ edema right lower extremity Neurologic:  Alert and  oriented x4;  grossly normal neurologically. Skin:  Intact without significant lesions or rashes.. Psych:  Alert and cooperative. Normal mood and affect.   Lab Results:  Recent Labs  10/17/15 2138 10/18/15 0235 10/18/15 0838  WBC 12.2* 11.0*  --   HGB 13.4 11.8* 11.8*  HCT 41.5 36.0 36.0  PLT 144* 137*  --    BMET  Recent Labs  10/17/15 2138 10/18/15 0235  NA 139 139  K 4.0 3.9  CL 101 103  CO2 28 28  GLUCOSE 125* 118*  BUN 23* 21*  CREATININE 0.83 0.79  CALCIUM  9.4 8.9   LFT  Recent Labs  10/17/15 2138  PROT 7.5  ALBUMIN 3.9  AST 22  ALT 12*  ALKPHOS 58  BILITOT 1.0      Studies/Results: Dg Abd Portable 1v  10/18/2015  CLINICAL DATA:  Constipation.  No bowel movement for 3 days. EXAM: PORTABLE ABDOMEN - 1 VIEW COMPARISON:  Lumbar spine radiographs 01/23/2012. FINDINGS: 0902 hours. There is a moderate amount of stool within the rectal vault. Colonic stool burden is otherwise within normal limits. There is gas throughout the small and large bowel without significant bowel distension or wall thickening. There is no supine evidence of free intraperitoneal air. Chronic L1 fracture, diffuse spondylosis and atherosclerosis noted. IMPRESSION: Prominent rectal stool.  No evidence of obstruction. Electronically Signed   By: Richardean Sale M.D.   On: 10/18/2015 09:24    IMPRESSION/PLAN: 81 year old female with a history of atrial fibrillation on Protonix admitted with GI bleed. Hemoglobin was initially 13.4 and emergency room and is currently 11.8. Patient has not had further bleeding since 6:30 this morning. This may be representative of a diverticular bleed, less likely ischemic colitis as patient is not having a lot of pain, AVMs, etc. Last colonoscopy 2010. Unfortunately, he shouldn't does not remember the gastroenterologist nor does she remember the name of her primary care provider in [place]. Patient may  have clear liquids today, and will be scheduled for a colonoscopy tomorrow.The risks, benefits, and alternatives to colonoscopy with possible biopsy and possible polypectomy were discussed with the patient and they consent to proceed.  She last took her pradaxa December 11 in the morning, kidney function normal. Trend hemoglobin.  Urinary retention CAD A. fib, on long-term anticoagulants Hyperlipidemia Hypertension    Hvozdovic, Vita Barley PA-C 10/18/2015,  Pager [phone]  Mon-Fri 8a-5p [phone] after 5p, weekends, holidays     [facility] Attending   I have taken an interval history, reviewed the chart and examined the patient. I agree with the Advanced Practitioner's note, impression and recommendations.   Working dxs are diverticular bleed, colon neoplasia, ischemic colitis, - explained to patient and daughter - The risks and benefits as well as alternatives of endoscopic procedure(s) have been discussed and reviewed. All questions answered. The patient agrees to proceed.  Gatha Mayer, MD, [facility] [phone] (pager) [phone] after 5 PM, weekends and holidays  10/18/2015 12:47 PM

## 2015-10-18 NOTE — Care Management Note (Signed)
Case Management Note  Patient Details  Name: Susan Conway MRN: [id number] Date of Birth: Dec 06, 1928  Subjective/Objective:           active rectal bleeding in patient on anticoagulant therapy         Action/Plan:Date: October 18, 2015 Chart reviewed for concurrent status and case management needs. Will continue to follow patient for changes and needs: Velva Harman, RN, BSN, [place]   [phone]  Expected Discharge Date:                  Expected Discharge Plan:  Home/Self Care  In-House Referral:  Clinical Social Work  Discharge planning Services  CM Consult  Post Acute Care Choice:  NA Choice offered to:  NA  DME Arranged:    DME Agency:     HH Arranged:    [place] Agency:     Status of Service:  Completed, signed off  Medicare Important Message Given:    Date Medicare IM Given:    Medicare IM give by:    Date Additional Medicare IM Given:    Additional Medicare Important Message give by:     If discussed at [place] of Stay Meetings, dates discussed:    Additional Comments:  Leeroy Cha, RN 10/18/2015, 2:50 PM

## 2015-10-18 NOTE — Progress Notes (Signed)
Pt is from [facility] at [facility]. CSW is available to assist with d/c planning if a higher level of care is needed at d/c.  Werner Lean LCSW [phone]

## 2015-10-18 NOTE — H&P (Signed)
[facility] Admission History and Physical       Susan Conway [id number] DOB: 1929/01/17 DOA: 10/17/2015  Referring physician: EDP PCP: Hollace Kinnier, DO  Specialists:   Chief Complaint: Rectal Bleeding  HPI: Susan Conway is a 81 y.o. female with a history of Atrial Fibrillation on Pradaxa Rx who presents to the ED with complaints of passing BRBPR x 4 episodes since 1230 pm.    She reports having ABD discomfort, and Bloating and Urinary retention.  She reports that she had been having constipation due to the Hydrocodone she was taking for her knee pain and took increased laxative Rx and ememas for relief and then she began to have bleeding not too long afterward.   She was seen in the ED and her initial hemoglobin was 13.4.   She reports that she last took her Pradaxa in the AM (on 10/17/2015).   She was also found to have Atrial Fibrillation rate in the 110's in the ED.   She was referred for admission, and GI Dr Hilarie Fredrickson was consulted to see her in the Am.        Review of Systems: Constitutional: No Weight Loss, No Weight Gain, Night Sweats, Fevers, Chills, Dizziness, Light Headedness, Fatigue, or Generalized Weakness HEENT: No Headaches, Difficulty Swallowing,Tooth/Dental Problems,Sore Throat,  No Sneezing, Rhinitis, Ear Ache, Nasal Congestion, or Post Nasal Drip,  Cardio-vascular:  No Chest pain, Orthopnea, PND, Edema in Lower Extremities, Anasarca, Dizziness, Palpitations  Resp: No Dyspnea, No DOE, No Productive Cough, No Non-Productive Cough, No Hemoptysis, No Wheezing.    GI: No Heartburn, Indigestion, +Abdominal Pain, ABD Distention,  Nausea, Vomiting, Diarrhea, Constipation, Hematemesis, +Hematochezia, Melena, Change in Bowel Habits,  Loss of Appetite  GU: No Dysuria, +Urinary  Retention,  No Change in Color of Urine, No Urgency or Urinary Frequency, No Flank pain.  Musculoskeletal: No Joint Pain or Swelling, No Decreased Range of Motion, No Back Pain.  Neurologic: No  Syncope, No Seizures, Muscle Weakness, Paresthesia, Vision Disturbance or Loss, No Diplopia, No Vertigo, No Difficulty Walking,  Skin: No Rash or Lesions. Psych: No Change in Mood or Affect, No Depression or Anxiety, No Memory loss, No Confusion, or Hallucinations   Past Medical History  Diagnosis Date  . PVC's (premature ventricular contractions)   . MI, old   . Osteoporosis   . Pain in joint, shoulder region 08/12/2012  . Other disorder of muscle, ligament, and fascia 04/08/2012  . Other abnormal blood chemistry 04/08/2012  . Degeneration of lumbar or lumbosacral intervertebral disc 01/31/2012  . Cervicalgia 01/31/2012  . Pain in thoracic spine 01/31/2012  . Pathologic fracture of vertebrae ([place]) 01/22/2012  . Myalgia and myositis, unspecified 01/17/2012  . Dizziness and giddiness 11/08/2011    vertigo  . Rash and other nonspecific skin eruption 08/02/2011  . Pain in limb 01/11/2011  . Pain in joint, upper arm 12/26/2010  . Atrial fibrillation ([place]) 09/21/2010  . Long term (current) use of anticoagulants 09/2010  . Other specified cardiac dysrhythmias(427.89) 06/27/2010  . External hemorrhoids without mention of complication 123XX123  . Varicose veins of lower extremities 08/16/2009  . Ganglion of tendon sheath 08/16/2009  . Cramp of limb 08/16/2009  . Disturbance of skin sensation 08/2009  . Lumbago 07/2009  . Meralgia paresthetica 07/2007  . Closed fracture of lumbar vertebra without mention of spinal cord injury 07/17/2005  . Unspecified essential hypertension 07/17/1997  . Coronary atherosclerosis of native coronary artery 07/17/1997  . Senile osteoporosis 07/17/1993  . Bell's palsy  07/18/1979  . Conjunctiva disorder 12/26/2010  . Varicose veins of lower extremities 08/16/2009  . External hemorrhoids without mention of complication [id number]  . Other abnormal blood chemistry 2013    hyperglycemia  . Basal cell carcinoma 05/25/2014    Multiple removed by Dr. Danella Sensing in March 2015:  right neck, left neck, scalp   . Actinic keratosis 05/25/2014  . Pain in joint, ankle and foot 10/27/2013    Bilateral since 1998   . Leg cramp 10/27/2013    Most frequently the right leg.   Marland Kitchen Anxiety      Past Surgical History  Procedure Laterality Date  . Tonsillectomy  1940's  . Coronary artery bypass graft  1998    x2; LIMA to LAD; SVG to diagonal off bypass  . Hemorrhoid surgery  08/26/2012    Dr. Brantley Stage  . Skin biopsy  01/29/14    (R) neck; (R) scalp, 2 (L) neck; shave biopsy Superficial basal cell carcinoma Dr. Danella Sensing      Prior to Admission medications   Medication Sig Start Date End Date Taking? Authorizing Provider  aspirin 81 MG tablet Take 81 mg by mouth daily. Take 1 tablet daily to help prevent stroke and heart attack.   Yes Historical Provider, MD  atenolol (TENORMIN) 25 MG tablet Take 1/2 tablet by mouth once daily To treat high blood pressure and angina. 07/06/15  Yes Tiffany L Reed, DO  calcium carbonate (OS-CAL) 600 MG TABS Take 600 mg by mouth 2 (two) times daily with a meal.    Yes Historical Provider, MD  cholecalciferol (VITAMIN D) 1000 UNITS tablet Take 1,000 Units by mouth daily. For Vitamin D supplement.   Yes Historical Provider, MD  Coenzyme Q10 (Q-10 CO-ENZYME PO) Take 1 Dose by mouth daily.   Yes Historical Provider, MD  digoxin (LANOXIN) 0.125 MG tablet Take one tablet by mouth every other day to control heart rhythm Patient taking differently: Take one tablet by mouth every other day to control heart rhythm (at night) 03/05/15  Yes [name], MD  HYDROcodone-acetaminophen (VICODIN ES) 7.5-750 MG per tablet One every 6 hours if needed for pain Patient taking differently: Take one tablet by mouth every 6 hours if needed for pain 07/09/14  Yes Tiffany L Reed, DO  loratadine (CLARITIN) 10 MG tablet Take 10 mg by mouth daily as needed for allergies.    Yes Historical Provider, MD  LORazepam (ATIVAN) 1 MG tablet TAKE 1 TABLET BY MOUTH AT BEDTIME TO  DECREASE ANXIETY AND HELP WITH SLEEP Patient taking differently: Take 1/2 tablet (0.5mg ) by mouth at bedtime 07/27/15  Yes Lauree Chandler, NP  nystatin-triamcinolone (MYCOLOG II) cream Apply 1 application topically 2 (two) times daily as needed (left buttock).   Yes Historical Provider, MD  PRADAXA 150 MG CAPS capsule TAKE ONE CAPSULE BY MOUTH TWICE A DAY FOR ANTICOAGULATION 04/12/15  Yes Tiffany L Reed, DO  simvastatin (ZOCOR) 20 MG tablet Take one tablet by mouth once daily for cholesterol Patient taking differently: Take one tablet by mouth once daily for cholesterol at night 07/06/15  Yes Tiffany L Reed, DO     Allergies  Allergen Reactions  . Iodinated Diagnostic Agents     Severe nausea and also passed out  . Sulfamethoxazole-Trimethoprim     Rash   . Bactrim     rash  . Relafen [Nabumetone] Rash    Social History:  reports that she has never smoked. She has never used smokeless tobacco. She  reports that she drinks alcohol. She reports that she does not use illicit drugs.    Family History  Problem Relation Age of Onset  . Heart attack Father   . Heart disease Father   . Hypertension Sister   . Heart disease Sister   . Heart disease Brother        Physical Exam:  GEN:  Pleasant Elderly Well Nourished and Well Developed  81 y.o. Caucasian female examined and in no acute distress; cooperative with exam Filed Vitals:   10/17/15 2118 10/17/15 2122 10/17/15 2331  BP:  166/106 155/99  Pulse:  94 97  Temp:  98.7 F (37.1 C)   TempSrc:  Oral   Resp:  20 16  Height:  5\' 6"  (1.676 m)   Weight:  56.7 kg (125 lb)   SpO2: 92% 99% 95%   Blood pressure 155/99, pulse 97, temperature 98.7 F (37.1 C), temperature source Oral, resp. rate 16, height 5\' 6"  (1.676 m), weight 56.7 kg (125 lb), SpO2 95 %. PSYCH: She is alert and oriented x4; does not appear anxious does not appear depressed; affect is normal HEENT: Normocephalic and Atraumatic, Mucous membranes pink; PERRLA; EOM  intact; Fundi:  Benign;  No scleral icterus, Nares: Patent, Oropharynx: Clear, Fair Dentition,    Neck:  FROM, No Cervical Lymphadenopathy nor Thyromegaly or Carotid Bruit; No JVD; Breasts:: Not examined CHEST WALL: No tenderness CHEST: Normal respiration, clear to auscultation bilaterally HEART: Regular rate and rhythm; no murmurs rubs or gallops BACK: No kyphosis or scoliosis; No CVA tenderness ABDOMEN: Positive Bowel Sounds, Soft Non-Tender, No Rebound or Guarding; No Masses, No Organomegaly. Rectal Exam: Not done EXTREMITIES: No Cyanosis, Clubbing, or Edema; No Ulcerations. Genitalia: not examined PULSES: 2+ and symmetric SKIN: Normal hydration no rash or ulceration CNS:  Alert and Oriented x 4, No Focal Deficits Vascular:  Pulses Intact  Labs on Admission:  Basic Metabolic Panel:  Recent Labs Lab 10/17/15 2138  NA 139  K 4.0  CL 101  CO2 28  GLUCOSE 125*  BUN 23*  CREATININE 0.83  CALCIUM 9.4   Liver Function Tests:  Recent Labs Lab 10/17/15 2138  AST 22  ALT 12*  ALKPHOS 58  BILITOT 1.0  PROT 7.5  ALBUMIN 3.9   No results for input(s): LIPASE, AMYLASE in the last 168 hours. No results for input(s): AMMONIA in the last 168 hours. CBC:  Recent Labs Lab 10/17/15 2138  WBC 12.2*  HGB 13.4  HCT 41.5  MCV 87.9  PLT 144*   Cardiac Enzymes: No results for input(s): CKTOTAL, CKMB, CKMBINDEX, TROPONINI in the last 168 hours.  BNP (last 3 results) No results for input(s): BNP in the last 8760 hours.  ProBNP (last 3 results) No results for input(s): PROBNP in the last 8760 hours.  CBG: No results for input(s): GLUCAP in the last 168 hours.  Radiological Exams on Admission: No results found.    EKG: Independently reviewed.     Assessment/Plan:     81 y.o. female with  Principal Problem:   1.      Rectal bleeding   Pradaxa Rx on Hold, last dose in AM 10/17/2015   Type and Screen sent   Monitor H/Hs every 8 hrs   Transfuse PRN   Active  Problems:   2.      Long term (current) use of anticoagulants   On Pradaxa Rx  (placed on Hold)    3.      Urinary Retention- possible due  to Medication   Straight Cath every 4 hrs PRN x 4     4.     CORONARY ATHEROSCLEROSIS NATIVE CORONARY ARTERY   On Atenolol, and Simvastatin Rx     5.     Atrial fibrillation ([place])   On  Pradaxa Rx     6.     Hyperlipidemia   On Simvastatin Rx     7.     Essential hypertension   On Atenolol Rx   Monitor BPs     8.     DVT Prophylaxis- had been on Pradaxa Rx   SCDs     Code Status:     DO NOT RESUSCITATE (DNR)      Family Communication:   Daughter at Bedside     Disposition Plan:    Inpatient Status        Time spent:  54 Minutes      Theressa Millard [facility] Pager [phone]   If [place] Please Contact the Day Rounding Team MD for [facility]  If 7PM-7AM, Please Contact Night-Floor Coverage  [email or web address] Password [email or web address] 10/18/2015, 12:15 AM     ADDENDUM:   Patient was seen and examined on 10/18/2015

## 2015-10-18 NOTE — Progress Notes (Signed)
[facility] Progress Note   [name]  [id number]  DOB: 1929/06/22  DOA: 10/17/2015 PCP: Hollace Kinnier, DO  Brief narrative: Susan Conway is a 81 y.o. female from [facility], coronary artery disease status post bypass, with atrial fibrillation on Pradaxa, hyperlipidemia who presents for rectal bleeding consisting of bright red blood 4 episodes. In addition she is having difficulty voiding. Per history she has been having trouble with constipation secondary to hydrocodone and has been using laxatives to relieve the constipation. She does have a history of hemorrhoids.    Subjective: States she has had 3-4 more bloody BMs today. No abdominal pain. No BM in 3 days. No nausea of vomiting. He had a colonoscopy in 2010 but it was not done in this area. She was not told that anything abnormal was seen.   Assessment/Plan: Principal Problem:   Rectal bleeding - h/o hemorrhoids - continues to bleed- holding Pradaxa and aspirin- GI assisting with management- colon prep ordered  Active Problems:  Anemia due to acute blood loss - transfuse if Hb < 8- Hb dropped from 13 to 11  Dehydration - NPO currently- increase IVF to 125 cc/hr - give a small bolus of 250 cc now  A-fib - HR about 110-115 at rest - small fluid bolus given as she is dehydrated - cont Atenolol and Digoxin- hold Pradaxa  Constipation - due to a couple of doses of Vicodin at home - tried taking a stool softener, prunes and Miralax - I have ordered an abdominal xray- stool noted in rectum- suppository ordered by GI- colon prep started  CAD s/p CABG - hold ASA   Code Status:     Code Status Orders        Start     Ordered   10/18/15 0233  Do not attempt resuscitation (DNR)   Continuous    Question Answer Comment  In the event of cardiac or respiratory ARREST Do not call a "code blue"   In the event of cardiac or respiratory ARREST Do not perform Intubation, CPR, defibrillation or  ACLS   In the event of cardiac or respiratory ARREST Use medication by any route, position, wound care, and other measures to relive pain and suffering. May use oxygen, suction and manual treatment of airway obstruction as needed for comfort.      10/18/15 0232    Advance Directive Documentation        Most Recent Value   Type of Advance Directive  Healthcare Power of Attorney, Living will, Out of facility DNR (pink MOST or yellow form)   Pre-existing out of facility DNR order (yellow form or pink MOST form)  Yellow form placed in chart (order not valid for inpatient use)   "MOST" Form in Place?       Family Communication: daughter at bedside Disposition Plan: GI eval- cont to follow in SDU due to ongoing bleeding and A-fib DVT prophylaxis: SCDs Consultants:GI Procedures:   Antibiotics: Anti-infectives    None      Objective: Filed Weights   10/17/15 2122  Weight: 56.7 kg (125 lb)    Intake/Output Summary (Last 24 hours) at 10/18/15 0803 Last data filed at 10/18/15 0032  Gross per 24 hour  Intake      0 ml  Output    300 ml  Net   -300 ml     Vitals Filed Vitals:   10/18/15 0252 10/18/15 0300 10/18/15 0400 10/18/15 0600  BP: 160/95  141/76 161/73  Pulse: 103 103 184 110  Temp: 98.8 F (37.1 C)  98 F (36.7 C) 99.1 F (37.3 C)  TempSrc:      Resp: 24 24 21 17   Height:      Weight:      SpO2: 92% 92% 95% 95%    Exam:  General:  Pt is alert, not in acute distress  HEENT: No icterus, No thrush, oral mucosa moist  Cardiovascular: IIRR, S1/S2 No murmur  Respiratory: clear to auscultation bilaterally   Abdomen: Soft, +Bowel sounds, non tender, non distended, no guarding  MSK: No LE edema, cyanosis or clubbing  Data Reviewed: Basic Metabolic Panel:  Recent Labs Lab 10/17/15 2138 10/18/15 0235  NA 139 139  K 4.0 3.9  CL 101 103  CO2 28 28  GLUCOSE 125* 118*  BUN 23* 21*  CREATININE 0.83 0.79  CALCIUM 9.4 8.9   Liver Function Tests:  Recent  Labs Lab 10/17/15 2138  AST 22  ALT 12*  ALKPHOS 58  BILITOT 1.0  PROT 7.5  ALBUMIN 3.9   No results for input(s): LIPASE, AMYLASE in the last 168 hours. No results for input(s): AMMONIA in the last 168 hours. CBC:  Recent Labs Lab 10/17/15 2138 10/18/15 0235  WBC 12.2* 11.0*  HGB 13.4 11.8*  HCT 41.5 36.0  MCV 87.9 89.6  PLT 144* 137*   Cardiac Enzymes: No results for input(s): CKTOTAL, CKMB, CKMBINDEX, TROPONINI in the last 168 hours. BNP (last 3 results) No results for input(s): BNP in the last 8760 hours.  ProBNP (last 3 results) No results for input(s): PROBNP in the last 8760 hours.  CBG: No results for input(s): GLUCAP in the last 168 hours.  Recent Results (from the past 240 hour(s))  MRSA PCR Screening     Status: None   Collection Time: 10/18/15  3:15 AM  Result Value Ref Range Status   MRSA by PCR NEGATIVE NEGATIVE Final    Comment:        The GeneXpert MRSA Assay (FDA approved for NASAL specimens only), is one component of a comprehensive MRSA colonization surveillance program. It is not intended to diagnose MRSA infection nor to guide or monitor treatment for MRSA infections.      Studies: No results found.  Scheduled Meds:  Scheduled Meds: . atenolol  12.5 mg Oral Daily  . digoxin  0.0625 mg Oral Daily  . pantoprazole (PROTONIX) IV  40 mg Intravenous Q24H  . simvastatin  20 mg Oral q1800  . vitamin A & D       Continuous Infusions: . sodium chloride 75 mL/hr at 10/18/15 0256    Time spent on care of this patient: 35 min   [place], MD 10/18/2015, 8:03 AM  LOS: 1 day   [facility] Office  [phone] Pager - Text Page per [email or web address] If 7PM-7AM, please contact night-coverage [email or web address]

## 2015-10-19 ENCOUNTER — Encounter (HOSPITAL_COMMUNITY)
Admission: EM | Disposition: A | Discharge status: Home Health | Payer: Self-pay | Source: Home / Self Care | Attending: Internal Medicine

## 2015-10-19 ENCOUNTER — Inpatient Hospital Stay (HOSPITAL_COMMUNITY): Payer: Medicare Other | Admitting: Anesthesiology

## 2015-10-19 ENCOUNTER — Encounter (HOSPITAL_COMMUNITY): Payer: Self-pay

## 2015-10-19 DIAGNOSIS — K648 Other hemorrhoids: Secondary | ICD-10-CM | POA: Insufficient documentation

## 2015-10-19 DIAGNOSIS — K922 Gastrointestinal hemorrhage, unspecified: Secondary | ICD-10-CM | POA: Insufficient documentation

## 2015-10-19 DIAGNOSIS — K625 Hemorrhage of anus and rectum: Secondary | ICD-10-CM

## 2015-10-19 DIAGNOSIS — K626 Ulcer of anus and rectum: Secondary | ICD-10-CM

## 2015-10-19 DIAGNOSIS — I4891 Unspecified atrial fibrillation: Secondary | ICD-10-CM

## 2015-10-19 DIAGNOSIS — I10 Essential (primary) hypertension: Secondary | ICD-10-CM

## 2015-10-19 DIAGNOSIS — K5901 Slow transit constipation: Secondary | ICD-10-CM

## 2015-10-19 HISTORY — PX: COLONOSCOPY WITH PROPOFOL: SHX5780

## 2015-10-19 LAB — BASIC METABOLIC PANEL
ANION GAP: 8 (ref 5–15)
BUN: 12 mg/dL (ref 6–20)
CHLORIDE: 109 mmol/L (ref 101–111)
CO2: 24 mmol/L (ref 22–32)
Calcium: 8 mg/dL — ABNORMAL LOW (ref 8.9–10.3)
Creatinine, Ser: 0.58 mg/dL (ref 0.44–1.00)
GFR calc Af Amer: >60 mL/min (ref >60)
GFR calc non Af Amer: >60 mL/min (ref >60)
GLUCOSE: 108 mg/dL — AB (ref 65–99)
POTASSIUM: 3.4 mmol/L — AB (ref 3.5–5.1)
Sodium: 141 mmol/L (ref 135–145)

## 2015-10-19 LAB — HEMOGLOBIN AND HEMATOCRIT, BLOOD
HCT: 35.1 % — ABNORMAL LOW (ref 36.0–46.0)
Hemoglobin: 11.3 g/dL — ABNORMAL LOW (ref 12.0–15.0)

## 2015-10-19 SURGERY — COLONOSCOPY WITH PROPOFOL
Anesthesia: Monitor Anesthesia Care

## 2015-10-19 MED ORDER — FENTANYL CITRATE (PF) 100 MCG/2ML IJ SOLN: 25.0000 ug | INTRAMUSCULAR | Status: DC | PRN

## 2015-10-19 MED ORDER — LIDOCAINE HCL (CARDIAC) 20 MG/ML IV SOLN
INTRAVENOUS | Status: DC | PRN
  Administered 2015-10-19: 50 mg via INTRAVENOUS

## 2015-10-19 MED ORDER — LACTATED RINGERS IV SOLN
INTRAVENOUS | Status: DC | PRN
  Administered 2015-10-19: 14:00:00 via INTRAVENOUS

## 2015-10-19 MED ORDER — PROPOFOL 10 MG/ML IV BOLUS
INTRAVENOUS | Status: DC | PRN
  Administered 2015-10-19: 20 mg via INTRAVENOUS

## 2015-10-19 MED ORDER — SODIUM CHLORIDE 0.9 % IV SOLN: INTRAVENOUS | Status: DC

## 2015-10-19 MED ORDER — PROPOFOL 500 MG/50ML IV EMUL
INTRAVENOUS | Status: DC | PRN
  Administered 2015-10-19: 120 ug/kg/min via INTRAVENOUS

## 2015-10-19 MED ORDER — PROPOFOL 10 MG/ML IV BOLUS
INTRAVENOUS | Status: AC
  Filled 2015-10-19: qty 40

## 2015-10-19 MED ORDER — POTASSIUM CHLORIDE CRYS ER 20 MEQ PO TBCR: 40.0000 meq | EXTENDED_RELEASE_TABLET | Freq: Once | ORAL | Status: DC

## 2015-10-19 SURGICAL SUPPLY — 21 items

## 2015-10-19 NOTE — Anesthesia Preprocedure Evaluation (Addendum)
Anesthesia Evaluation  Patient identified by MRN, date of birth, ID band Patient awake    Reviewed: Allergy & Precautions, H&P , NPO status , Patient's Chart, lab work & pertinent test results  Airway Mallampati: II  TM Distance: >3 FB Neck ROM: full    Dental no notable dental hx. (+) Dental Advisory Given, Teeth Intact   Pulmonary neg pulmonary ROS,    Pulmonary exam normal breath sounds clear to auscultation       Cardiovascular Exercise Tolerance: Good hypertension, Pt. on medications + CAD, + Past MI and + Peripheral Vascular Disease  Normal cardiovascular exam+ dysrhythmias Atrial Fibrillation  Rhythm:regular Rate:Normal  PVCs. ECG - AF   Neuro/Psych negative neurological ROS  negative psych ROS   GI/Hepatic negative GI ROS, Neg liver ROS,   Endo/Other  negative endocrine ROS  Renal/GU negative Renal ROS  negative genitourinary   Musculoskeletal   Abdominal   Peds  Hematology negative hematology ROS (+)   Anesthesia Other Findings   Reproductive/Obstetrics negative OB ROS                            Anesthesia Physical Anesthesia Plan  ASA: III  Anesthesia Plan: MAC   Post-op Pain Management:    Induction:   Airway Management Planned: Natural Airway and Simple Face Mask  Additional Equipment:   Intra-op Plan:   Post-operative Plan:   Informed Consent: I have reviewed the patients History and Physical, chart, labs and discussed the procedure including the risks, benefits and alternatives for the proposed anesthesia with the patient or authorized representative who has indicated his/her understanding and acceptance.   Dental Advisory Given  Plan Discussed with: CRNA and Surgeon  Anesthesia Plan Comments:        Anesthesia Quick Evaluation

## 2015-10-19 NOTE — Progress Notes (Signed)
PT Cancellation Note  Patient Details Name: Susan Conway MRN: [id number] DOB: 11/01/1929   Cancelled Treatment:    Reason Eval/Treat Not Completed: Patient at procedure or test/unavailable (endo)will check back as schedule allows today when awake.   Claretha Cooper 10/19/2015, 12:25 PM Tresa Endo PT [phone]

## 2015-10-19 NOTE — Anesthesia Postprocedure Evaluation (Signed)
Anesthesia Post Note  Patient: Susan Conway  Procedure(s) Performed: Procedure(s) (LRB): COLONOSCOPY WITH PROPOFOL (N/A)  Patient location during evaluation: PACU Anesthesia Type: General Level of consciousness: awake and alert Pain management: pain level controlled Vital Signs Assessment: post-procedure vital signs reviewed and stable Respiratory status: spontaneous breathing, nonlabored ventilation, respiratory function stable and patient connected to nasal cannula oxygen Cardiovascular status: blood pressure returned to baseline and stable Postop Assessment: no signs of nausea or vomiting Anesthetic complications: no    Last Vitals:  Filed Vitals:   10/19/15 1435 10/19/15 1440  BP:    Pulse: 92 87  Temp:    Resp: 25     Last Pain:  Filed Vitals:   10/19/15 1440  PainSc: 0-No pain                 Maiko Salais L

## 2015-10-19 NOTE — Op Note (Signed)
[facility] [place], [id number]   COLONOSCOPY PROCEDURE REPORT  PATIENT: Susan Conway, Susan Conway  MR#: [id number] BIRTHDATE: May 25, 1929 , 14  yrs. old GENDER: female ENDOSCOPIST: Gatha Mayer, MD, [facility] PROCEDURE DATE:  10/19/2015 PROCEDURE:   Colonoscopy, diagnostic and Colonoscopy with biopsy First Screening Colonoscopy - Avg.  risk and is 50 yrs.  old or older - No.  Prior Negative Screening - Now for repeat screening. N/A  History of Adenoma - Now for follow-up colonoscopy & has been > or = to 3 yrs.  N/A  Polyps removed today? No Recommend repeat exam, <10 yrs? No ASA CLASS:   Class III INDICATIONS:Evaluation of unexplained GI bleeding and Patient is not applicable for Colorectal Neoplasm Risk Assessment for this procedure. MEDICATIONS: Monitored anesthesia care and Per Anesthesia  DESCRIPTION OF PROCEDURE:   After the risks benefits and alternatives of the procedure were thoroughly explained, informed consent was obtained.  The digital rectal exam revealed no abnormalities of the rectum.   The     endoscope was introduced through the anus and advanced to the cecum, which was identified by both the appendix and ileocecal valve. No adverse events experienced.   The quality of the prep was good.  The instrument was then slowly withdrawn as the colon was fully examined. Estimated blood loss is zero unless otherwise noted in this procedure report.  COLON FINDINGS: 1) A few small rectal ulcers - biopsies taken 2) Internal hemorrhoids with stigmata of bleeding 3) Otherwise normal colonoscopy w/ good prep.  Retroflexed views revealed internal hemorrhoids. The time to cecum = 2.9 Withdrawal time = 11.4   The scope was withdrawn and the procedure completed. COMPLICATIONS: There were no immediate complications.  ENDOSCOPIC IMPRESSION: 1) A few small rectal ulcers - biopsies taken - doubt were cause of bleeding 2) Internal hemorrhoids with stigmata of  bleeding - suspcet these bled 3) Otherwise normal colonoscopy w/ good prep  RECOMMENDATIONS: 1.  Consider return to Dr.  Brantley Stage for repeat scleroTx of hemorrhoids, may use hydrocortisone cream prn now 2.  Office will call with the pathology results. 3.  Would wait 3 days to resume anticoagulation 4.  Home soon (by tomorrow I suspect from GI standopoint - ok for floor)  eSigned:  Gatha Mayer, MD, [facility] 10/19/2015 2:31 PM   cc: The Patient, Dr. Hollace Kinnier

## 2015-10-19 NOTE — Progress Notes (Signed)
      Gastroenterology Progress Note  Subjective:   Hgb 11.3. Had no further bloody BMs prior to starting prep last pm. Had blood with BMs during prep but much less this morning. Feels well.   Objective:  Vital signs in last 24 hours: Temp:  [97.7 F (36.5 C)-98.7 F (37.1 C)] 98.7 F (37.1 C) (12/13 0400) Pulse Rate:  [85-107] 88 (12/13 0400) Resp:  [12-30] 16 (12/13 0400) BP: (125-180)/(64-90) 135/78 mmHg (12/13 0400) SpO2:  [95 %-98 %] 97 % (12/13 0400) Last BM Date: 10/17/15 General:   Alert,  Well-developed,    in NAD Heart:  Regular rate and rhythm; no murmurs Pulm;lungs clear Abdomen:  Soft, nontender and nondistended. Normal bowel sounds, without guarding, and without rebound.   Extremities:  1+ edema LE Neurologic: Alert and  oriented x4;  grossly normal neurologically. Psych: Alert and cooperative. Normal mood and affect.  Lab Results:  Recent Labs  10/17/15 2138 10/18/15 0235 10/18/15 0838 10/18/15 1708 10/19/15 0116  WBC 12.2* 11.0*  --   --   --   HGB 13.4 11.8* 11.8* 10.9* 11.3*  HCT 41.5 36.0 36.0 34.2* 35.1*  PLT 144* 137*  --   --   --    BMET  Recent Labs  10/17/15 2138 10/18/15 0235 10/19/15 0116  NA 139 139 141  K 4.0 3.9 3.4*  CL 101 103 109  CO2 28 28 24   GLUCOSE 125* 118* 108*  BUN 23* 21* 12  CREATININE 0.83 0.79 0.58  CALCIUM 9.4 8.9 8.0*   Assessment/PLAN: 81 year old female with a history of atrial fibrillation on Protonix admitted with GI bleed. Hemoglobin was initially 13.4 and emergency room and is currently 11.3.Working dxs are diverticular bleed, colon neoplasia, ischemic colitis--for colonoscopy later today.   LOS: 2 days   Hvozdovic, Vita Barley PA-C 10/19/2015, Pager [phone] Mon-Fri 8a-5p [phone] after 5p, weekends, holidays  Agree w/ Ms. Hvozdovic's note and mangement.

## 2015-10-19 NOTE — Transfer of Care (Signed)
Immediate Anesthesia Transfer of Care Note  Patient: Susan Conway  Procedure(s) Performed: Procedure(s): COLONOSCOPY WITH PROPOFOL (N/A)  Patient Location: PACU and Endoscopy Unit  Anesthesia Type:MAC  Level of Consciousness: awake, alert , oriented and patient cooperative  Airway & Oxygen Therapy: Patient Spontanous Breathing and Patient connected to face mask oxygen  Post-op Assessment: Report given to RN, Post -op Vital signs reviewed and stable and Patient moving all extremities  Post vital signs: Reviewed and stable  Last Vitals:  Filed Vitals:   10/19/15 1059 10/19/15 1220  BP: 174/78 154/78  Pulse: 103 94  Temp:    Resp:  30    Complications: No apparent anesthesia complications

## 2015-10-19 NOTE — Progress Notes (Addendum)
[facility] Progress Note   Susan Conway  [id number]  DOB: [birth year]  DOA: 10/17/2015 PCP: Hollace Kinnier, DO  Brief narrative: Susan Conway is a 81 y.o. female from [facility], coronary artery disease status post bypass, with atrial fibrillation on Pradaxa, hyperlipidemia who presents for rectal bleeding consisting of bright red blood 4 episodes. In addition she is having difficulty voiding. Per history she has been having trouble with constipation secondary to hydrocodone and has been using laxatives to relieve the constipation. She does have a history of hemorrhoids.    Subjective: No further bleeding Plan for colonoscopy today  Assessment/Plan: Principal Problem:   Rectal bleeding - h/o hemorrhoids - continues to bleed- holding Pradaxa and aspirin- GI assisting with management- colon prep ordered- plan for colonscopy   Anemia due to acute blood loss - transfuse if Hb < 8  Dehydration - NPO currently- increase IVF to 125 cc/hr - give a small bolus of 250 cc now  A-fib - HR about 110-115 at rest - small fluid bolus given as she is dehydrated - cont Atenolol and Digoxin- hold Pradaxa  Constipation - due to a couple of doses of Vicodin at home - tried taking a stool softener, prunes and Miralax - colon prep started  CAD s/p CABG - hold ASA   Code Status: DNR Family Communication: daughter at bedside- sleeping Disposition Plan: tx to floor after colonoscopy? DVT prophylaxis: SCDs Consultants:GI Procedures: colonoscopy   Antibiotics: Anti-infectives    None      Objective: Filed Weights   10/17/15 2122  Weight: 56.7 kg (125 lb)    Intake/Output Summary (Last 24 hours) at 10/19/15 0803 Last data filed at 10/19/15 0600  Gross per 24 hour  Intake   3000 ml  Output   1800 ml  Net   1200 ml     Vitals Filed Vitals:   10/18/15 2010 10/19/15 0024 10/19/15 0030 10/19/15 0400  BP:  146/82  135/78  Pulse: 85  95 88  Temp:    97.9 F (36.6 C) 98.7 F (37.1 C)  TempSrc:    Oral  Resp: 23  22 16   Height:      Weight:      SpO2: 96%  98% 97%    Exam:  General:  Pt is alert, not in acute distress  Cardiovascular: IIRR, S1/S2 No murmur  Respiratory: clear to auscultation bilaterally   Abdomen: Soft, +Bowel sounds, non tender, non distended, no guarding  MSK: No LE edema, cyanosis or clubbing  Data Reviewed: Basic Metabolic Panel:  Recent Labs Lab 10/17/15 2138 10/18/15 0235 10/19/15 0116  NA 139 139 141  K 4.0 3.9 3.4*  CL 101 103 109  CO2 28 28 24   GLUCOSE 125* 118* 108*  BUN 23* 21* 12  CREATININE 0.83 0.79 0.58  CALCIUM 9.4 8.9 8.0*   Liver Function Tests:  Recent Labs Lab 10/17/15 2138  AST 22  ALT 12*  ALKPHOS 58  BILITOT 1.0  PROT 7.5  ALBUMIN 3.9   No results for input(s): LIPASE, AMYLASE in the last 168 hours. No results for input(s): AMMONIA in the last 168 hours. CBC:  Recent Labs Lab 10/17/15 2138 10/18/15 0235 10/18/15 0838 10/18/15 1708 10/19/15 0116  WBC 12.2* 11.0*  --   --   --   HGB 13.4 11.8* 11.8* 10.9* 11.3*  HCT 41.5 36.0 36.0 34.2* 35.1*  MCV 87.9 89.6  --   --   --   PLT 144* 137*  --   --   --  Cardiac Enzymes: No results for input(s): CKTOTAL, CKMB, CKMBINDEX, TROPONINI in the last 168 hours. BNP (last 3 results) No results for input(s): BNP in the last 8760 hours.  ProBNP (last 3 results) No results for input(s): PROBNP in the last 8760 hours.  CBG: No results for input(s): GLUCAP in the last 168 hours.  Recent Results (from the past 240 hour(s))  MRSA PCR Screening     Status: None   Collection Time: 10/18/15  3:15 AM  Result Value Ref Range Status   MRSA by PCR NEGATIVE NEGATIVE Final    Comment:        The GeneXpert MRSA Assay (FDA approved for NASAL specimens only), is one component of a comprehensive MRSA colonization surveillance program. It is not intended to diagnose MRSA infection nor to guide or monitor treatment  for MRSA infections.      Studies: Dg Abd Portable 1v  10/18/2015  CLINICAL DATA:  Constipation.  No bowel movement for 3 days. EXAM: PORTABLE ABDOMEN - 1 VIEW COMPARISON:  Lumbar spine radiographs 01/23/2012. FINDINGS: 0902 hours. There is a moderate amount of stool within the rectal vault. Colonic stool burden is otherwise within normal limits. There is gas throughout the small and large bowel without significant bowel distension or wall thickening. There is no supine evidence of free intraperitoneal air. Chronic L1 fracture, diffuse spondylosis and atherosclerosis noted. IMPRESSION: Prominent rectal stool.  No evidence of obstruction. Electronically Signed   By: Richardean Sale M.D.   On: 10/18/2015 09:24    Scheduled Meds:  Scheduled Meds: . atenolol  12.5 mg Oral Daily  . diclofenac sodium  2 g Topical QID  . digoxin  0.0625 mg Oral Daily  . LORazepam  0.5 mg Oral QHS  . pantoprazole (PROTONIX) IV  40 mg Intravenous Q24H  . simvastatin  20 mg Oral q1800   Continuous Infusions: . sodium chloride 125 mL/hr at 10/18/15 1956    Time spent on care of this patient: 35 min   [place], DO  10/19/2015, 8:03 AM  LOS: 2 days   [facility] Office  [phone] Pager - Text Page per [email or web address] If 7PM-7AM, please contact night-coverage [email or web address]

## 2015-10-20 ENCOUNTER — Encounter (HOSPITAL_COMMUNITY): Payer: Self-pay | Admitting: Internal Medicine

## 2015-10-20 LAB — BASIC METABOLIC PANEL
Anion gap: 5 (ref 5–15)
BUN: 10 mg/dL (ref 6–20)
CHLORIDE: 107 mmol/L (ref 101–111)
CO2: 25 mmol/L (ref 22–32)
Calcium: 7.9 mg/dL — ABNORMAL LOW (ref 8.9–10.3)
Creatinine, Ser: 0.71 mg/dL (ref 0.44–1.00)
GFR calc Af Amer: >60 mL/min (ref >60)
GFR calc non Af Amer: >60 mL/min (ref >60)
GLUCOSE: 141 mg/dL — AB (ref 65–99)
POTASSIUM: 3.2 mmol/L — AB (ref 3.5–5.1)
Sodium: 137 mmol/L (ref 135–145)

## 2015-10-20 LAB — CBC
HCT: 34.1 % — ABNORMAL LOW (ref 36.0–46.0)
HEMOGLOBIN: 11.1 g/dL — AB (ref 12.0–15.0)
MCH: 28.8 pg (ref 26.0–34.0)
MCHC: 32.6 g/dL (ref 30.0–36.0)
MCV: 88.3 fL (ref 78.0–100.0)
PLATELETS: 124 10*3/uL — AB (ref 150–400)
RBC: 3.86 MIL/uL — AB (ref 3.87–5.11)
RDW: 12.6 % (ref 11.5–15.5)
WBC: 7.3 10*3/uL (ref 4.0–10.5)

## 2015-10-20 MED ORDER — HYDROCORTISONE ACE-PRAMOXINE 2.5-1 % RE CREA: TOPICAL_CREAM | Freq: Three times a day (TID) | RECTAL | Status: DC | PRN

## 2015-10-20 MED ORDER — LORAZEPAM 1 MG PO TABS: ORAL_TABLET | ORAL | Status: DC

## 2015-10-20 MED ORDER — HYDROCORTISONE ACE-PRAMOXINE 2.5-1 % RE CREA
TOPICAL_CREAM | Freq: Three times a day (TID) | RECTAL | Status: DC | PRN
  Filled 2015-10-20: qty 30

## 2015-10-20 MED ORDER — DICLOFENAC SODIUM 1 % TD GEL: 2.0000 g | Freq: Four times a day (QID) | TRANSDERMAL | Status: DC

## 2015-10-20 MED ORDER — PANTOPRAZOLE SODIUM 40 MG PO TBEC: 40.0000 mg | DELAYED_RELEASE_TABLET | Freq: Every day | ORAL | Status: DC

## 2015-10-20 NOTE — Progress Notes (Signed)
[facility] Progress Note  Subjective:   Hgb 11.1. S/P colonoscopy yesterday:  ENDOSCOPIC IMPRESSION: 1) A few small rectal ulcers - biopsies taken - doubt were cause of bleeding 2) Internal hemorrhoids with stigmata of bleeding - suspcet these bled 3) Otherwise normal colonoscopy w/ good prep RECOMMENDATIONS: 1. Consider return to Dr. Brantley Stage for repeat scleroTx of hemorrhoids, may use hydrocortisone cream prn now 2. Office will call with the pathology results. 3. Would wait 3 days to resume anticoagulation 4. Home soon (by tomorrow I suspect from GI standopoint - ok for floor) Feels well this morning. No N/V. Tol diet. Objective:  Vital signs in last 24 hours: Temp:  [97.7 F (36.5 C)-99.2 F (37.3 C)] 98.2 F (36.8 C) (12/14 0618) Pulse Rate:  [87-106] 93 (12/14 0618) Resp:  [14-30] 20 (12/14 0618) BP: (118-174)/(52-85) 159/83 mmHg (12/14 0618) SpO2:  [94 %-100 %] 99 % (12/14 0618) Last BM Date: 10/19/15 General:   Alert,  Well-developed,    in NAD Heart:  Regular rate and rhythm; no murmurs Pulm;lungs clear Abdomen:  Soft, nontender and nondistended. Normal bowel sounds, without guarding, and without rebound.   Extremities:  Without edema. Neurologic: Alert and  oriented x4;  grossly normal neurologically. Psych:  Alert and cooperative. Normal mood and affect.  Intake/Output from previous day: 12/13 0701 - 12/14 0700 In: 1427.5 [P.O.:720; I.V.:707.5] Out: 850 [Urine:850] Intake/Output this shift:    Lab Results:  Recent Labs  10/17/15 2138 10/18/15 0235  10/18/15 1708 10/19/15 0116 10/20/15 0410  WBC 12.2* 11.0*  --   --   --  7.3  HGB 13.4 11.8*  < > 10.9* 11.3* 11.1*  HCT 41.5 36.0  < > 34.2* 35.1* 34.1*  PLT 144* 137*  --   --   --  124*  < > = values in this interval not displayed. BMET  Recent Labs  10/18/15 0235 10/19/15 0116 10/20/15 0410  NA 139 141 137  K 3.9 3.4* 3.2*  CL 103 109 107  CO2 28 24 25   GLUCOSE 118* 108*  141*  BUN 21* 12 10  CREATININE 0.79 0.58 0.71  CALCIUM 8.9 8.0* 7.9*   LFT  Recent Labs  10/17/15 2138  PROT 7.5  ALBUMIN 3.9  AST 22  ALT 12*  ALKPHOS 58  BILITOT 1.0   PT/INR  Recent Labs  10/18/15 1708  LABPROT 16.4*  INR 1.31    Dg Abd Portable 1v  10/18/2015  CLINICAL DATA:  Constipation.  No bowel movement for 3 days. EXAM: PORTABLE ABDOMEN - 1 VIEW COMPARISON:  Lumbar spine radiographs 01/23/2012. FINDINGS: 0902 hours. There is a moderate amount of stool within the rectal vault. Colonic stool burden is otherwise within normal limits. There is gas throughout the small and large bowel without significant bowel distension or wall thickening. There is no supine evidence of free intraperitoneal air. Chronic L1 fracture, diffuse spondylosis and atherosclerosis noted. IMPRESSION: Prominent rectal stool.  No evidence of obstruction. Electronically Signed   By: Richardean Sale M.D.   On: 10/18/2015 09:24    ASSESSMENT/PLAN:  81 yo female admitted with GIB on anticoag. Had colonoscopy yesterday that revealed internal hemorrhoids and rectal ulcers. Biopsies pending. From GI standpoint, pt able to go home. Needs to be on bowel regimen of miralax 1-2 capfuls daily--can titrate up or down to achieve desired effect. Once off her pain meds for knee and shoulder pain, she may not need daily miralax. GI office will contact pt with  path results. Pt shoud remain off anticoag for 3 days post procedure.Pt instructed to contact Dr Brantley Stage for repeat sclerotherapy of hemorrhoids.F/U with GI prn.  Principal Problem:   Rectal bleeding Active Problems:   Hyperlipidemia   CORONARY ATHEROSCLEROSIS NATIVE CORONARY ARTERY   Atrial fibrillation (HCC)   Essential hypertension   Long term (current) use of anticoagulants   Constipation   Lower GI bleed   Internal bleeding hemorrhoids   Rectal ulcer     LOS: 3 days   Giannamarie Paulus P PA-C 10/20/2015, Pager [phone] Mon-Fri 8a-5p [phone]  after 5p, weekends, holidays

## 2015-10-20 NOTE — Discharge Summary (Signed)
Physician Discharge Summary  [name] [id number] DOB: 10/18/1929 DOA: 10/17/2015  PCP: Hollace Kinnier, DO  Admit date: 10/17/2015 Discharge date: 10/20/2015  Time spent: 35 minutes  Recommendations for Outpatient Follow-up:  1. Home health PT-- back to [facility] at [facility] 2. Await 3 days to resume ASA/pradaxa 3. See more recommendations below   Discharge Diagnoses:  Principal Problem:   Rectal bleeding Active Problems:   Hyperlipidemia   CORONARY ATHEROSCLEROSIS NATIVE CORONARY ARTERY   Atrial fibrillation (HCC)   Essential hypertension   Long term (current) use of anticoagulants   Constipation   Lower GI bleed   Internal bleeding hemorrhoids   Rectal ulcer   Discharge Condition: improved  Diet recommendation: cardiac  Filed Weights   10/17/15 2122  Weight: 56.7 kg (125 lb)    History of present illness:  Susan Conway is a 81 y.o. female with a history of Atrial Fibrillation on Pradaxa Rx who presents to the ED with complaints of passing BRBPR x 4 episodes since 1230 pm.  She reports having ABD discomfort, and Bloating and Urinary retention. She reports that she had been having constipation due to the Hydrocodone she was taking for her knee pain and took increased laxative Rx and ememas for relief and then she began to have bleeding not too long afterward. She was seen in the ED and her initial hemoglobin was 13.4. She reports that she last took her Pradaxa in the AM (on 10/17/2015). She was also found to have Atrial Fibrillation rate in the 110's in the ED. She was referred for admission, and GI Dr Hilarie Fredrickson was consulted to see her in the Am.   Hospital Course:  Rectal Bleeding ENDOSCOPIC IMPRESSION: 1) A few small rectal ulcers - biopsies taken - doubt were cause of bleeding 2) Internal hemorrhoids with stigmata of bleeding - suspcet these bled 3) Otherwise normal colonoscopy w/ good prep  RECOMMENDATIONS: 1. Consider return to Dr. Brantley Stage for  repeat scleroTx of hemorrhoids, may use hydrocortisone cream prn now 2. GI Office will call with the pathology results. 3. wait 3 days to resume anticoagulation  -bowel regemin   Procedures:    Consultations:  GI  Discharge Exam: Filed Vitals:   10/20/15 0231 10/20/15 0618  BP: 157/85 159/83  Pulse: 87 93  Temp: 97.7 F (36.5 C) 98.2 F (36.8 C)  Resp: 20 20    General: awake, NAD- eating breakfast   Discharge Instructions   Discharge Instructions    Diet - low sodium heart healthy    Complete by:  As directed      Discharge instructions    Complete by:  As directed   BMP 1 week re K Home health contact Dr Brantley Stage for repeat sclerotherapy of hemorrhoids miralax 1-2 capfuls daily--can titrate up or down to achieve desired effect Can resume ASA/pradaxa in 3 days     Increase activity slowly    Complete by:  As directed           Current Discharge Medication List    START taking these medications   Details  diclofenac sodium (VOLTAREN) 1 % GEL Apply 2 g topically 4 (four) times daily. Qty: 100 g, Refills: 9    hydrocortisone-pramoxine (ANALPRAM-HC) 2.5-1 % rectal cream Place rectally 3 (three) times daily as needed for hemorrhoids or itching. Qty: 30 g, Refills: 0      CONTINUE these medications which have CHANGED   Details  LORazepam (ATIVAN) 1 MG tablet Take 1/2 tablet (0.5mg ) by mouth  at bedtime Qty: 90 tablet, Refills: 0      CONTINUE these medications which have NOT CHANGED   Details  atenolol (TENORMIN) 25 MG tablet Take 1/2 tablet by mouth once daily To treat high blood pressure and angina. Qty: 90 tablet, Refills: 3    calcium carbonate (OS-CAL) 600 MG TABS Take 600 mg by mouth 2 (two) times daily with a meal.     cholecalciferol (VITAMIN D) 1000 UNITS tablet Take 1,000 Units by mouth daily. For Vitamin D supplement.    Coenzyme Q10 (Q-10 CO-ENZYME PO) Take 1 Dose by mouth daily.    digoxin (LANOXIN) 0.125 MG tablet Take one tablet by  mouth every other day to control heart rhythm Qty: 90 tablet, Refills: 3   Associated Diagnoses: Atrial fibrillation, unspecified    HYDROcodone-acetaminophen (VICODIN ES) 7.5-750 MG per tablet One every 6 hours if needed for pain Qty: 100 tablet, Refills: 0    loratadine (CLARITIN) 10 MG tablet Take 10 mg by mouth daily as needed for allergies.     nystatin-triamcinolone (MYCOLOG II) cream Apply 1 application topically 2 (two) times daily as needed (left buttock).    simvastatin (ZOCOR) 20 MG tablet Take one tablet by mouth once daily for cholesterol Qty: 90 tablet, Refills: 3      STOP taking these medications     aspirin 81 MG tablet      PRADAXA 150 MG CAPS capsule        Allergies  Allergen Reactions  . Iodinated Diagnostic Agents     Severe nausea and also passed out  . Sulfamethoxazole-Trimethoprim     Rash   . Bactrim     rash  . Relafen [Nabumetone] Rash   Follow-up Information    Follow up with CORNETT,THOMAS A., MD.   Specialty:  General Surgery   Why:  For evaluation for possible scloerotherapy of hemorrhoids.   Contact information:   [address] [place] [address] [phone]       Follow up with [facility].   Specialty:  Gastroenterology   Why:  As needed   Contact information:   [address] [place] [phone]      Follow up with REED, TIFFANY, DO In 1 week.   Specialty:  Geriatric Medicine   Why:  BMp 1 week re K   Contact information:   [place] [address] [phone]        The results of significant diagnostics from this hospitalization (including imaging, microbiology, ancillary and laboratory) are listed below for reference.    Significant Diagnostic Studies: Dg Knee Complete 4 Views Right  10/12/2015  CLINICAL DATA:  Posterior and patellar right knee pain. Generalized swelling. EXAM: RIGHT KNEE - COMPLETE 4+ VIEW COMPARISON:  None. FINDINGS:  Mild joint space narrowing. Small joint effusion. No acute bony abnormality. Specifically, no fracture, subluxation, or dislocation. Soft tissues are intact. IMPRESSION: No acute bony abnormality. Mild degenerative changes and small joint effusion. Electronically Signed   By: Rolm Baptise M.D.   On: 10/12/2015 15:39   Dg Abd Portable 1v  10/18/2015  CLINICAL DATA:  Constipation.  No bowel movement for 3 days. EXAM: PORTABLE ABDOMEN - 1 VIEW COMPARISON:  Lumbar spine radiographs 01/23/2012. FINDINGS: 0902 hours. There is a moderate amount of stool within the rectal vault. Colonic stool burden is otherwise within normal limits. There is gas throughout the small and large bowel without significant bowel distension or wall thickening.  There is no supine evidence of free intraperitoneal air. Chronic L1 fracture, diffuse spondylosis and atherosclerosis noted. IMPRESSION: Prominent rectal stool.  No evidence of obstruction. Electronically Signed   By: Richardean Sale M.D.   On: 10/18/2015 09:24   Mm Screening Breast Tomo Bilateral  09/22/2015  CLINICAL DATA:  Screening. EXAM: DIGITAL SCREENING BILATERAL MAMMOGRAM WITH 3D TOMO WITH CAD COMPARISON:  Previous exam(s). ACR Breast Density Category c: The breast tissue is heterogeneously dense, which may obscure small masses. FINDINGS: There are no findings suspicious for malignancy. Images were processed with CAD. IMPRESSION: No mammographic evidence of malignancy. A result letter of this screening mammogram will be mailed directly to the patient. RECOMMENDATION: Screening mammogram in one year. (Code:SM-B-01Y) BI-RADS CATEGORY  1: Negative. Electronically Signed   By: Nolon Nations M.D.   On: 09/22/2015 10:29    Microbiology: Recent Results (from the past 240 hour(s))  MRSA PCR Screening     Status: None   Collection Time: 10/18/15  3:15 AM  Result Value Ref Range Status   MRSA by PCR NEGATIVE NEGATIVE Final    Comment:        The GeneXpert MRSA Assay  (FDA approved for NASAL specimens only), is one component of a comprehensive MRSA colonization surveillance program. It is not intended to diagnose MRSA infection nor to guide or monitor treatment for MRSA infections.      Labs: Basic Metabolic Panel:  Recent Labs Lab 10/17/15 2138 10/18/15 0235 10/19/15 0116 10/20/15 0410  NA 139 139 141 137  K 4.0 3.9 3.4* 3.2*  CL 101 103 109 107  CO2 28 28 24 25   GLUCOSE 125* 118* 108* 141*  BUN 23* 21* 12 10  CREATININE 0.83 0.79 0.58 0.71  CALCIUM 9.4 8.9 8.0* 7.9*   Liver Function Tests:  Recent Labs Lab 10/17/15 2138  AST 22  ALT 12*  ALKPHOS 58  BILITOT 1.0  PROT 7.5  ALBUMIN 3.9   No results for input(s): LIPASE, AMYLASE in the last 168 hours. No results for input(s): AMMONIA in the last 168 hours. CBC:  Recent Labs Lab 10/17/15 2138 10/18/15 0235 10/18/15 0838 10/18/15 1708 10/19/15 0116 10/20/15 0410  WBC 12.2* 11.0*  --   --   --  7.3  HGB 13.4 11.8* 11.8* 10.9* 11.3* 11.1*  HCT 41.5 36.0 36.0 34.2* 35.1* 34.1*  MCV 87.9 89.6  --   --   --  88.3  PLT 144* 137*  --   --   --  124*   Cardiac Enzymes: No results for input(s): CKTOTAL, CKMB, CKMBINDEX, TROPONINI in the last 168 hours. BNP: BNP (last 3 results) No results for input(s): BNP in the last 8760 hours.  ProBNP (last 3 results) No results for input(s): PROBNP in the last 8760 hours.  CBG: No results for input(s): GLUCAP in the last 168 hours.     Signed:  Dow Blahnik UPCHURCH Mckenize Mezera  [facility] 10/20/2015, 1:44 PM

## 2015-10-20 NOTE — Care Management Important Message (Signed)
Important Message  Patient Details  Name: Susan Conway MRN: [id number] Date of Birth: 1929-01-02   Medicare Important Message Given:  Yes    Camillo Flaming 10/20/2015, 12:18 [place] Message  Patient Details  Name: Susan Conway MRN: [id number] Date of Birth: 27-Jan-1929   Medicare Important Message Given:  Yes    Camillo Flaming 10/20/2015, 12:18 PM

## 2015-10-20 NOTE — Evaluation (Signed)
Physical Therapy Evaluation Patient Details Name: Susan Conway MRN: [id number] DOB: [birth year] Today's Date: 10/20/2015   History of Present Illness  81 yo female admitted with rectal bleeding. s/p colonoscopy 12/13. Hx of Afib, vertigo, MI, osteoporosis, myalgia, chronic pain,sciatica  Clinical Impression  On eval, pt required Min guard-Min assist for mobility-walked ~350 feet. Intermittent unsteadiness during session requiring external assist at times. Recommend HHPT (1-2 visits, if pt is agreeable) to ensure return to baseline since pt lives alone.     Follow Up Recommendations Home health PT (at least 1-2 visits to ensure return to baseline)    Equipment Recommendations  None recommended by PT    Recommendations for Other Services       Precautions / Restrictions Precautions Precautions: Fall Restrictions Weight Bearing Restrictions: No      Mobility  Bed Mobility Overal bed mobility: Modified Independent                Transfers Overall transfer level: Needs assistance   Transfers: Sit to/from Stand Sit to Stand: Min guard         General transfer comment: close guard for safety.   Ambulation/Gait Ambulation/Gait assistance: Min assist;Min guard Ambulation Distance (Feet): 350 Feet Assistive device: None Gait Pattern/deviations: Step-through pattern;Staggering right;Drifts right/left;Staggering left     General Gait Details: Very unseady initially requiring intermittent assist to stabilize. Progressed to Min guard assist towards end of walk. Tolerated distance well,.   Stairs            Wheelchair Mobility    Modified Rankin (Stroke Patients Only)       Balance Overall balance assessment: Needs assistance         Standing balance support: During functional activity Standing balance-Leahy Scale: Good Standing balance comment: had pt perform withstanding of external pertubations, EO/EC and narrow BOS static standing, 360 degree turns,  pick up of object-all with Min guard assist and increased time                             Pertinent Vitals/Pain Pain Assessment: No/denies pain    Home Living Family/patient expects to be discharged to:: Private residence     Type of Home: Apartment Environmental education officer)         Home Equipment: None      Prior Function Level of Independence: Independent               Hand Dominance        Extremity/Trunk Assessment   Upper Extremity Assessment: Overall WFL for tasks assessed           Lower Extremity Assessment: Generalized weakness      Cervical / Trunk Assessment: Normal  Communication   Communication: No difficulties  Cognition Arousal/Alertness: Awake/alert Behavior During Therapy: WFL for tasks assessed/performed Overall Cognitive Status: Within Functional Limits for tasks assessed                      General Comments      Exercises        Assessment/Plan    PT Assessment Patient needs continued PT services  PT Diagnosis Difficulty walking;Generalized weakness   PT Problem List Decreased strength;Decreased balance;Decreased mobility  PT Treatment Interventions Gait training;Functional mobility training;Therapeutic activities;Patient/family education;Balance training;Therapeutic exercise   PT Goals (Current goals can be found in the Care Plan section) Acute Rehab PT Goals Patient Stated Goal: home tomorrow PT Goal Formulation: With patient Time  For Goal Achievement: 11/03/15 Potential to Achieve Goals: Good    Frequency Min 3X/week   Barriers to discharge        Co-evaluation               End of Session Equipment Utilized During Treatment: Gait belt Activity Tolerance: Patient tolerated treatment well Patient left: in chair;with call bell/phone within reach;with chair alarm set           Time: [id number] PT Time Calculation (min) (ACUTE ONLY): 21 min   Charges:   PT Evaluation $Initial PT Evaluation  Tier I: 1 Procedure     PT G Codes:        Weston Anna, MPT Pager: [phone]

## 2015-10-20 NOTE — Progress Notes (Signed)
Await PT eval as patient from IL at [facility], if she does well and no bleeding today, plan to d/c back this PM.  Eulogio Bear DO

## 2015-10-20 NOTE — Progress Notes (Signed)
Patient educated on discharge instructions. No questions. Follow-up appointment made. No further questions. IV removed.

## 2015-10-21 ENCOUNTER — Encounter: Payer: Self-pay | Admitting: Internal Medicine

## 2015-10-21 ENCOUNTER — Other Ambulatory Visit: Payer: Medicare Other

## 2015-10-21 NOTE — Progress Notes (Signed)
Quick Note:  Neg bx - letter sent ______

## 2015-10-27 ENCOUNTER — Non-Acute Institutional Stay: Payer: Medicare Other | Admitting: Internal Medicine

## 2015-10-27 ENCOUNTER — Encounter (HOSPITAL_COMMUNITY): Payer: Self-pay | Admitting: Emergency Medicine

## 2015-10-27 ENCOUNTER — Emergency Department (HOSPITAL_COMMUNITY)
Admission: EM | Admit: 2015-10-27 | Discharge: 2015-10-27 | Disposition: A | Discharge status: Home / Self Care | Payer: Medicare Other | Attending: Emergency Medicine | Admitting: Emergency Medicine

## 2015-10-27 ENCOUNTER — Encounter: Payer: Self-pay | Admitting: Internal Medicine

## 2015-10-27 VITALS — BP 162/100 | HR 88 | Temp 97.7°F | Ht 66.0 in | Wt 127.0 lb

## 2015-10-27 DIAGNOSIS — I2510 Atherosclerotic heart disease of native coronary artery without angina pectoris: Secondary | ICD-10-CM | POA: Insufficient documentation

## 2015-10-27 DIAGNOSIS — K648 Other hemorrhoids: Secondary | ICD-10-CM | POA: Diagnosis not present

## 2015-10-27 DIAGNOSIS — Z8639 Personal history of other endocrine, nutritional and metabolic disease: Secondary | ICD-10-CM | POA: Insufficient documentation

## 2015-10-27 DIAGNOSIS — Z79899 Other long term (current) drug therapy: Secondary | ICD-10-CM | POA: Insufficient documentation

## 2015-10-27 DIAGNOSIS — R030 Elevated blood-pressure reading, without diagnosis of hypertension: Secondary | ICD-10-CM

## 2015-10-27 DIAGNOSIS — D62 Acute posthemorrhagic anemia: Secondary | ICD-10-CM

## 2015-10-27 DIAGNOSIS — Z8669 Personal history of other diseases of the nervous system and sense organs: Secondary | ICD-10-CM | POA: Insufficient documentation

## 2015-10-27 DIAGNOSIS — Z8781 Personal history of (healed) traumatic fracture: Secondary | ICD-10-CM | POA: Insufficient documentation

## 2015-10-27 DIAGNOSIS — I10 Essential (primary) hypertension: Secondary | ICD-10-CM | POA: Insufficient documentation

## 2015-10-27 DIAGNOSIS — M25561 Pain in right knee: Secondary | ICD-10-CM | POA: Diagnosis not present

## 2015-10-27 DIAGNOSIS — F419 Anxiety disorder, unspecified: Secondary | ICD-10-CM | POA: Insufficient documentation

## 2015-10-27 DIAGNOSIS — R0902 Hypoxemia: Secondary | ICD-10-CM

## 2015-10-27 DIAGNOSIS — M79604 Pain in right leg: Secondary | ICD-10-CM | POA: Diagnosis present

## 2015-10-27 DIAGNOSIS — Z7901 Long term (current) use of anticoagulants: Secondary | ICD-10-CM | POA: Diagnosis not present

## 2015-10-27 DIAGNOSIS — M79661 Pain in right lower leg: Secondary | ICD-10-CM | POA: Diagnosis not present

## 2015-10-27 DIAGNOSIS — Z951 Presence of aortocoronary bypass graft: Secondary | ICD-10-CM | POA: Insufficient documentation

## 2015-10-27 DIAGNOSIS — Z7902 Long term (current) use of antithrombotics/antiplatelets: Secondary | ICD-10-CM | POA: Diagnosis not present

## 2015-10-27 DIAGNOSIS — I4891 Unspecified atrial fibrillation: Secondary | ICD-10-CM | POA: Insufficient documentation

## 2015-10-27 DIAGNOSIS — M7989 Other specified soft tissue disorders: Secondary | ICD-10-CM | POA: Insufficient documentation

## 2015-10-27 DIAGNOSIS — K626 Ulcer of anus and rectum: Secondary | ICD-10-CM | POA: Diagnosis not present

## 2015-10-27 DIAGNOSIS — Z872 Personal history of diseases of the skin and subcutaneous tissue: Secondary | ICD-10-CM | POA: Insufficient documentation

## 2015-10-27 DIAGNOSIS — Z85828 Personal history of other malignant neoplasm of skin: Secondary | ICD-10-CM | POA: Diagnosis not present

## 2015-10-27 DIAGNOSIS — I499 Cardiac arrhythmia, unspecified: Secondary | ICD-10-CM | POA: Insufficient documentation

## 2015-10-27 DIAGNOSIS — Z791 Long term (current) use of non-steroidal anti-inflammatories (NSAID): Secondary | ICD-10-CM | POA: Diagnosis not present

## 2015-10-27 DIAGNOSIS — Z8719 Personal history of other diseases of the digestive system: Secondary | ICD-10-CM | POA: Insufficient documentation

## 2015-10-27 DIAGNOSIS — I252 Old myocardial infarction: Secondary | ICD-10-CM | POA: Diagnosis not present

## 2015-10-27 DIAGNOSIS — IMO0001 Reserved for inherently not codable concepts without codable children: Secondary | ICD-10-CM

## 2015-10-27 DIAGNOSIS — M810 Age-related osteoporosis without current pathological fracture: Secondary | ICD-10-CM | POA: Insufficient documentation

## 2015-10-27 LAB — COMPREHENSIVE METABOLIC PANEL
ALT: 19 U/L (ref 14–54)
ANION GAP: 9 (ref 5–15)
AST: 22 U/L (ref 15–41)
Albumin: 3.1 g/dL — ABNORMAL LOW (ref 3.5–5.0)
Alkaline Phosphatase: 64 U/L (ref 38–126)
BUN: 14 mg/dL (ref 6–20)
CHLORIDE: 102 mmol/L (ref 101–111)
CO2: 31 mmol/L (ref 22–32)
CREATININE: 0.78 mg/dL (ref 0.44–1.00)
Calcium: 9.4 mg/dL (ref 8.9–10.3)
Glucose, Bld: 105 mg/dL — ABNORMAL HIGH (ref 65–99)
Potassium: 4 mmol/L (ref 3.5–5.1)
SODIUM: 142 mmol/L (ref 135–145)
Total Bilirubin: 0.3 mg/dL (ref 0.3–1.2)
Total Protein: 6.3 g/dL — ABNORMAL LOW (ref 6.5–8.1)

## 2015-10-27 LAB — CBC WITH DIFFERENTIAL/PLATELET
Basophils Absolute: 0 10*3/uL (ref 0.0–0.1)
Basophils Relative: 0 %
EOS ABS: 0 10*3/uL (ref 0.0–0.7)
EOS PCT: 1 %
HCT: 36.5 % (ref 36.0–46.0)
Hemoglobin: 11.5 g/dL — ABNORMAL LOW (ref 12.0–15.0)
LYMPHS ABS: 1.2 10*3/uL (ref 0.7–4.0)
LYMPHS PCT: 19 %
MCH: 28.5 pg (ref 26.0–34.0)
MCHC: 31.5 g/dL (ref 30.0–36.0)
MCV: 90.3 fL (ref 78.0–100.0)
MONO ABS: 1.6 10*3/uL — AB (ref 0.1–1.0)
MONOS PCT: 24 %
Neutro Abs: 3.7 10*3/uL (ref 1.7–7.7)
Neutrophils Relative %: 56 %
PLATELETS: 190 10*3/uL (ref 150–400)
RBC: 4.04 MIL/uL (ref 3.87–5.11)
RDW: 12.6 % (ref 11.5–15.5)
WBC: 6.5 10*3/uL (ref 4.0–10.5)

## 2015-10-27 MED ORDER — ATENOLOL 25 MG PO TABS: ORAL_TABLET | ORAL | Status: DC

## 2015-10-27 MED ORDER — HYDRALAZINE HCL 20 MG/ML IJ SOLN
10.0000 mg | Freq: Once | INTRAMUSCULAR | Status: AC
  Administered 2015-10-27: 10 mg via INTRAVENOUS
  Filled 2015-10-27: qty 1

## 2015-10-27 MED ORDER — HYDROCHLOROTHIAZIDE 25 MG PO TABS: 25.0000 mg | ORAL_TABLET | Freq: Every day | ORAL | Status: DC

## 2015-10-27 NOTE — ED Notes (Signed)
Doctor at bedside.

## 2015-10-27 NOTE — ED Notes (Signed)
The patient was sent her by her PCP due to her BP being higher than what she normally is.  The patient does take BP medication and she has taken it today.  The PCP is also concerned that her right leg is swollen and she was concerned she might have a blood clot.  She denies any pain but says her leg is swollen.

## 2015-10-27 NOTE — Progress Notes (Signed)
Patient ID: Susan Conway, female   DOB: Jul 02, 1929, 81 y.o.   MRN: [id number]   Location: [facility] Provider: Gagan Dillion L. Mariea Clonts, D.O., C.M.D.  Code Status: DNR Goals of Care: Advanced Directive information Does patient have an advance directive?: Yes, Type of Advance Directive: [place];Living will;Out of facility DNR (pink MOST or yellow form), Pre-existing out of facility DNR order (yellow form or pink MOST form): Yellow form placed in chart (order not valid for inpatient use)  Chief Complaint  Patient presents with  . Hospitalization Follow-up    in hospital 10/17/15 to 10/19/17 for rectal bleeding, colonoscopy 10/19/15  . knee swelling    swelling and pain right knee and in lower leg.     HPI: Patient is a 81 y.o. female seen in the office today for hospital f/u when she was admitted 12/11 to 12/14 with rectal bleeding.  Pradaxa for her afib was held.  Underwent cscope by Dr. Carlean Purl and it revealed some rectal ulcers that have benign pathology.  Internal hemorrhoids seemed to be cause on her cscope.   No significant bleeding since.  A few hemorrhoid spots on tissue.  Will see Dr. Brantley Stage in January about the hemorrhoids as planned.  Has been having small bms and maybe two instead of one.  Strength is gradually returning.  hgb went to 10.9 at lowest when usually runs 13.4/13.5.  She did not require a transfusion.  Right knee still hurting and lower leg and ankle are still hurting and swollen.  MRI of right knee was meant to be the day after discharge from the hospital.  She now has had tight swelling of her right lower leg, pain in the calf and edema into her ankle and foot.  This had begun during the hospitalization, she reports and it's less tight than it was then, but still sore and painful.    Right shoulder also bothering her--bursitis flared up.  Started her on voltaren gel at the hospital.  Could not pick up a spoon to her mouth or comb her hair before that  due to pain.  Got a refill done from the hospital.    Had IV in right arm.  Was sore afterward--still has knot under the skin in each arm--IV in left arm and unused port if blood needed was in right arm.  Left IV had to be removed due to some redness around the site and swelling, pt reports.    She denies chest pain, shortness of breath.  She still appears very pale to me.  Her blood pressure is quite elevated--rechecked three times and each was approximately 162/100.  She has been somewhat hypoxic vs. Her baseline with POX 91% today, only up to 92 later in the visit (normally upper 90s).  She is still weak but feels better than she did.  Heart rate irregular but not rapid today.  Review of Systems:  Review of Systems  Constitutional: Positive for weight loss and malaise/fatigue. Negative for fever and chills.  Respiratory: Negative for hemoptysis and shortness of breath.   Cardiovascular: Positive for leg swelling. Negative for chest pain and palpitations.       Right leg swelling; pain medial posterior knee and calf worse with walking; knee not as painful now and effusion no longer present  Gastrointestinal: Negative for abdominal pain, blood in stool and melena.       Did have hemorrhoids  Musculoskeletal: Positive for joint pain. Negative for falls.  Skin: Negative for rash.  Two small swellings at sites of IVs on bilateral arms, nontender, no erythema  Neurological: Positive for weakness. Negative for dizziness and loss of consciousness.  Endo/Heme/Allergies: Bruises/bleeds easily.  Psychiatric/Behavioral: Negative for memory loss.    Past Medical History  Diagnosis Date  . PVC's (premature ventricular contractions)   . MI, old   . Osteoporosis   . Pain in joint, shoulder region 08/12/2012  . Other disorder of muscle, ligament, and fascia 04/08/2012  . Other abnormal blood chemistry 04/08/2012  . Degeneration of lumbar or lumbosacral intervertebral disc 01/31/2012  . Cervicalgia  01/31/2012  . Pain in thoracic spine 01/31/2012  . Pathologic fracture of vertebrae ([place]) 01/22/2012  . Myalgia and myositis, unspecified 01/17/2012  . Dizziness and giddiness 11/08/2011    vertigo  . Rash and other nonspecific skin eruption 08/02/2011  . Pain in limb 01/11/2011  . Pain in joint, upper arm 12/26/2010  . Atrial fibrillation ([place]) 09/21/2010  . Long term (current) use of anticoagulants 09/2010  . Other specified cardiac dysrhythmias(427.89) 06/27/2010  . External hemorrhoids without mention of complication [id number]  . Varicose veins of lower extremities 08/16/2009  . Ganglion of tendon sheath 08/16/2009  . Cramp of limb 08/16/2009  . Disturbance of skin sensation 08/2009  . Lumbago 07/2009  . Meralgia paresthetica 07/2007  . Closed fracture of lumbar vertebra without mention of spinal cord injury 07/17/2005  . Unspecified essential hypertension 07/17/1997  . Coronary atherosclerosis of native coronary artery 07/17/1997  . Senile osteoporosis 07/17/1993  . Bell's palsy 07/18/1979  . Conjunctiva disorder 12/26/2010  . Varicose veins of lower extremities 08/16/2009  . External hemorrhoids without mention of complication [id number]  . Other abnormal blood chemistry 2013    hyperglycemia  . Basal cell carcinoma 05/25/2014    Multiple removed by Dr. Danella Sensing in March 2015: right neck, left neck, scalp   . Actinic keratosis 05/25/2014  . Pain in joint, ankle and foot 10/27/2013    Bilateral since 1998   . Leg cramp 10/27/2013    Most frequently the right leg.   Marland Kitchen Anxiety     Past Surgical History  Procedure Laterality Date  . Tonsillectomy  1940's  . Coronary artery bypass graft  1998    x2; LIMA to LAD; SVG to diagonal off bypass  . Hemorrhoid surgery  08/26/2012    Dr. Brantley Stage  . Skin biopsy  01/29/14    (R) neck; (R) scalp, 2 (L) neck; shave biopsy Superficial basal cell carcinoma Dr. Danella Sensing  . Colonoscopy with propofol N/A 10/19/2015    Procedure: COLONOSCOPY WITH  PROPOFOL;  Surgeon: Gatha Mayer, MD;  Location: [facility];  Service: Endoscopy;  Laterality: N/A;    Allergies  Allergen Reactions  . Iodinated Diagnostic Agents     Severe nausea and also passed out  . Sulfamethoxazole-Trimethoprim     Rash   . Bactrim     rash  . Relafen [Nabumetone] Rash      Medication List       This list is accurate as of: 10/27/15  4:35 PM.  Always use your most recent med list.               atenolol 25 MG tablet  Commonly known as:  TENORMIN  Take 1/2 tablet by mouth once daily To treat high blood pressure and angina.     calcium carbonate 600 MG Tabs tablet  Commonly known as:  OS-CAL  Take 600 mg by mouth 2 (two) times daily  with a meal.     cholecalciferol 1000 UNITS tablet  Commonly known as:  VITAMIN D  Take 1,000 Units by mouth daily. For Vitamin D supplement.     diclofenac sodium 1 % Gel  Commonly known as:  VOLTAREN  Apply 2 g topically 4 (four) times daily.     digoxin 0.125 MG tablet  Commonly known as:  LANOXIN  Take one tablet by mouth every other day to control heart rhythm     HYDROcodone-acetaminophen 7.5-750 MG tablet  Commonly known as:  VICODIN ES  One every 6 hours if needed for pain     hydrocortisone-pramoxine 2.5-1 % rectal cream  Commonly known as:  ANALPRAM-HC  Place rectally 3 (three) times daily as needed for hemorrhoids or itching.     loratadine 10 MG tablet  Commonly known as:  CLARITIN  Take 10 mg by mouth daily as needed for allergies.     LORazepam 1 MG tablet  Commonly known as:  ATIVAN  Take 1/2 tablet (0.5mg ) by mouth at bedtime     Q-10 CO-ENZYME PO  Take 1 Dose by mouth daily.     simvastatin 20 MG tablet  Commonly known as:  ZOCOR  Take one tablet by mouth once daily for cholesterol        Health Maintenance  Topic Date Due  . INFLUENZA VACCINE  06/06/2016  . PNA vac Low Risk Adult (2 of 2 - PPSV23) 09/21/2016  . TETANUS/TDAP  11/06/2017  . DEXA SCAN  Completed  .  ZOSTAVAX  Completed    Physical Exam: Filed Vitals:   10/27/15 1625  BP: 162/100  Pulse: 88  Temp: 97.7 F (36.5 C)  TempSrc: Oral  Height: 5\' 6"  (1.676 m)  Weight: 127 lb (57.607 kg)  SpO2: 91%   Body mass index is 20.51 kg/(m^2). Physical Exam  Constitutional: She is oriented to person, place, and time. No distress.  Cardiovascular: Intact distal pulses.   irreg irreg; right lower leg nonpitting edema vs. Left, positive Homan's, tender medial posterior popliteal space  Pulmonary/Chest: Effort normal and breath sounds normal. No respiratory distress.  Abdominal: Soft. Bowel sounds are normal.  Musculoskeletal: Normal range of motion.  Neurological: She is alert and oriented to person, place, and time.  Skin: There is pallor.  Psychiatric: She has a normal mood and affect.    Labs reviewed: Basic Metabolic Panel:  Recent Labs  09/14/15  10/18/15 0235 10/19/15 0116 10/20/15 0410  NA 142  < > 139 141 137  K 4.2  < > 3.9 3.4* 3.2*  CL  --   < > 103 109 107  CO2  --   < > 28 24 25   GLUCOSE  --   < > 118* 108* 141*  BUN 19  < > 21* 12 10  CREATININE 0.9  < > 0.79 0.58 0.71  CALCIUM  --   < > 8.9 8.0* 7.9*  TSH 3.80  --   --   --   --   < > = values in this interval not displayed. Liver Function Tests:  Recent Labs  09/14/15 10/17/15 2138  AST 20 22  ALT 11 12*  ALKPHOS 63 58  BILITOT  --  1.0  PROT  --  7.5  ALBUMIN  --  3.9   No results for input(s): LIPASE, AMYLASE in the last 8760 hours. No results for input(s): AMMONIA in the last 8760 hours. CBC:  Recent Labs  10/17/15 2138 10/18/15 0235  10/18/15 1708 10/19/15 0116 10/20/15 0410  WBC 12.2* 11.0*  --   --   --  7.3  HGB 13.4 11.8*  < > 10.9* 11.3* 11.1*  HCT 41.5 36.0  < > 34.2* 35.1* 34.1*  MCV 87.9 89.6  --   --   --  88.3  PLT 144* 137*  --   --   --  124*  < > = values in this interval not displayed. Lipid Panel:  Recent Labs  09/14/15  CHOL 134  HDL 67  LDLCALC 67  TRIG 52     Procedures since last visit: Dg Abd Portable 1v  10/18/2015  CLINICAL DATA:  Constipation.  No bowel movement for 3 days. EXAM: PORTABLE ABDOMEN - 1 VIEW COMPARISON:  Lumbar spine radiographs 01/23/2012. FINDINGS: 0902 hours. There is a moderate amount of stool within the rectal vault. Colonic stool burden is otherwise within normal limits. There is gas throughout the small and large bowel without significant bowel distension or wall thickening. There is no supine evidence of free intraperitoneal air. Chronic L1 fracture, diffuse spondylosis and atherosclerosis noted. IMPRESSION: Prominent rectal stool.  No evidence of obstruction. Electronically Signed   By: Richardean Sale M.D.   On: 10/18/2015 09:24   Cscope reviewed as in hpi. 10/19/15 Dr. Carlean Purl  Assessment/Plan 1. Right medial knee pain - initially felt to be due to injury during Christmas decorating, but xray was unremarkable and MRI got deferred with her rectal bleeding -initial plan was to reschedule this until concerns about swelling of the right lower leg, relative hypoxia, htn and h/o pradaxa being held were unveiled  2. Essential hypertension -elevated today well outside her norm -I'm concerned for a PE at this time with her possible DVT right leg after pradaxa held with rectal bleeding and recent knee pain before that -sent to [facility] ED for eval and treatment--needs EKG, RLE venous doppler and CT PE protocol  3. Pain and swelling of lower leg, right -suspect DVT in previously injured right leg  4. Hypoxia -mild, but still concerning considering recent history  5. Internal hemorrhoid, bleeding -noted on her cscope and felt to be the cause of her recent rectal bleeding -plans to see Dr. Brantley Stage in Jan for treatment  6. Rectal ulcer -pathology was benign when reviewed from cscope  7. Acute blood loss anemia -has been symptomatically improving and h/h was trending back up during hospitalization -also needs repeat h/h  in ED   Labs/tests ordered:  ED eval to r/o DVT, PE Next appt:  Not yet scheduled due to urgent ED referral--will see s/p hospital discharge  [place] L. Marshawn Normoyle, D.O. [facility] [address], [place] [address] Cell Phone (Mon-Fri 8am-5pm):  [phone] On Call:  [phone] & follow prompts after 5pm & weekends Office Phone:  [phone] Office Fax:  [phone]

## 2015-10-27 NOTE — Discharge Instructions (Signed)
How to Take Your Blood Pressure HOW DO I GET A BLOOD PRESSURE MACHINE?  You can buy an electronic home blood pressure machine at your local pharmacy. Insurance will sometimes cover the cost if you have a prescription.  Ask your doctor what type of machine is best for you. There are different machines for your arm and your wrist.  If you decide to buy a machine to check your blood pressure on your arm, first check the size of your arm so you can buy the right size cuff. To check the size of your arm:   Use a measuring tape that shows both inches and centimeters.   Wrap the measuring tape around the upper-middle part of your arm. You may need someone to help you measure.   Write down your arm measurement in both inches and centimeters.   To measure your blood pressure correctly, it is important to have the right size cuff.   If your arm is up to 13 inches (up to 34 centimeters), get an adult cuff size.  If your arm is 13 to 17 inches (35 to 44 centimeters), get a large adult cuff size.    If your arm is 17 to 20 inches (45 to 52 centimeters), get an adult thigh cuff.  WHAT DO THE NUMBERS MEAN?   There are two numbers that make up your blood pressure. For example: 120/80.  The first number (120 in our example) is called the "systolic pressure." It is a measure of the pressure in your blood vessels when your heart is pumping blood.  The second number (80 in our example) is called the "diastolic pressure." It is a measure of the pressure in your blood vessels when your heart is resting between beats.  Your doctor will tell you what your blood pressure should be. WHAT SHOULD I DO BEFORE I CHECK MY BLOOD PRESSURE?   Try to rest or relax for at least 30 minutes before you check your blood pressure.  Do not smoke.  Do not have any drinks with caffeine, such as:  Soda.  Coffee.  Tea.  Check your blood pressure in a quiet room.  Sit down and stretch out your arm on a table.  Keep your arm at about the level of your heart. Let your arm relax.  Make sure that your legs are not crossed. HOW DO I CHECK MY BLOOD PRESSURE?  Follow the directions that came with your machine.  Make sure you remove any tight-fitting clothing from your arm or wrist. Wrap the cuff around your upper arm or wrist. You should be able to fit a finger between the cuff and your arm. If you cannot fit a finger between the cuff and your arm, it is too tight and should be removed and rewrapped.  Some units require you to manually pump up the arm cuff.  Automatic units inflate the cuff when you press a button.  Cuff deflation is automatic in both models.  After the cuff is inflated, the unit measures your blood pressure and pulse. The readings are shown on a monitor. Hold still and breathe normally while the cuff is inflated.  Getting a reading takes less than a minute.  Some models store readings in a memory. Some provide a printout of readings. If your machine does not store your readings, keep a written record.  Take readings with you to your next visit with your doctor.   This information is not intended to replace advice given to  you by your health care provider. Make sure you discuss any questions you have with your health care provider.   Document Released: 10/05/2008 Document Revised: 11/13/2014 Document Reviewed: 12/18/2013 Elsevier Interactive Patient Education 2016 [employer].  Hypertension Hypertension is another name for high blood pressure. High blood pressure forces your heart to work harder to pump blood. A blood pressure reading has two numbers, which includes a higher number over a lower number (example: 110/72). HOME CARE   Have your blood pressure rechecked by your doctor.  Only take medicine as told by your doctor. Follow the directions carefully. The medicine does not work as well if you skip doses. Skipping doses also puts you at risk for problems.  Do not  smoke.  Monitor your blood pressure at home as told by your doctor. GET HELP IF:  You think you are having a reaction to the medicine you are taking.  You have repeat headaches or feel dizzy.  You have puffiness (swelling) in your ankles.  You have trouble with your vision. GET HELP RIGHT AWAY IF:   You get a very bad headache and are confused.  You feel weak, numb, or faint.  You get chest or belly (abdominal) pain.  You throw up (vomit).  You cannot breathe very well. MAKE SURE YOU:   Understand these instructions.  Will watch your condition.  Will get help right away if you are not doing well or get worse.   This information is not intended to replace advice given to you by your health care provider. Make sure you discuss any questions you have with your health care provider.   Document Released: 04/10/2008 Document Revised: 10/28/2013 Document Reviewed: 08/15/2013 Elsevier Interactive Patient Education [employer].

## 2015-10-27 NOTE — ED Provider Notes (Signed)
CSN: [id number]     Arrival date & time 10/27/15  1947 History   First MD Initiated Contact with Patient 10/27/15 2031     Chief Complaint  Patient presents with  . Hypertension    The patient was sent her by her PCP due to her BP being higher than what she normally is.  The patient does take BP medication and she has taken it today.  The PCP is also concerned that her right leg is swollen and she was concerned she might have a blood clot.  . Leg Swelling     (Consider location/radiation/quality/duration/timing/severity/associated sxs/prior Treatment) Patient is a 81 y.o. female presenting with leg pain. The history is provided by the patient.  Leg Pain Location:  Leg Time since incident:  3 weeks Injury: no   Leg location:  R lower leg Pain details:    Quality:  Aching   Radiates to:  Does not radiate   Severity:  Moderate   Onset quality:  Gradual   Timing:  Intermittent   Progression:  Waxing and waning Chronicity:  New Dislocation: no   Prior injury to area:  No Relieved by:  Rest Worsened by:  Activity Ineffective treatments:  Elevation, immobilization and rest Associated symptoms: swelling   Associated symptoms: no back pain, no decreased ROM, no fatigue, no fever, no itching, no muscle weakness, no neck pain, no numbness, no stiffness and no tingling     Past Medical History  Diagnosis Date  . PVC's (premature ventricular contractions)   . MI, old   . Osteoporosis   . Pain in joint, shoulder region 08/12/2012  . Other disorder of muscle, ligament, and fascia 04/08/2012  . Other abnormal blood chemistry 04/08/2012  . Degeneration of lumbar or lumbosacral intervertebral disc 01/31/2012  . Cervicalgia 01/31/2012  . Pain in thoracic spine 01/31/2012  . Pathologic fracture of vertebrae ([place]) 01/22/2012  . Myalgia and myositis, unspecified 01/17/2012  . Dizziness and giddiness 11/08/2011    vertigo  . Rash and other nonspecific skin eruption 08/02/2011  . Pain in limb 01/11/2011   . Pain in joint, upper arm 12/26/2010  . Atrial fibrillation ([place]) 09/21/2010  . Long term (current) use of anticoagulants 09/2010  . Other specified cardiac dysrhythmias(427.89) 06/27/2010  . External hemorrhoids without mention of complication 123XX123  . Varicose veins of lower extremities 08/16/2009  . Ganglion of tendon sheath 08/16/2009  . Cramp of limb 08/16/2009  . Disturbance of skin sensation 08/2009  . Lumbago 07/2009  . Meralgia paresthetica 07/2007  . Closed fracture of lumbar vertebra without mention of spinal cord injury 07/17/2005  . Unspecified essential hypertension 07/17/1997  . Coronary atherosclerosis of native coronary artery 07/17/1997  . Senile osteoporosis 07/17/1993  . Bell's palsy 07/18/1979  . Conjunctiva disorder 12/26/2010  . Varicose veins of lower extremities 08/16/2009  . External hemorrhoids without mention of complication A999333  . Other abnormal blood chemistry 2013    hyperglycemia  . Basal cell carcinoma 05/25/2014    Multiple removed by Dr. Danella Sensing in March 2015: right neck, left neck, scalp   . Actinic keratosis 05/25/2014  . Pain in joint, ankle and foot 10/27/2013    Bilateral since 1998   . Leg cramp 10/27/2013    Most frequently the right leg.   Marland Kitchen Anxiety    Past Surgical History  Procedure Laterality Date  . Tonsillectomy  1940's  . Coronary artery bypass graft  1998    x2; LIMA to LAD; SVG to diagonal  off bypass  . Hemorrhoid surgery  08/26/2012    Dr. Brantley Stage  . Skin biopsy  01/29/14    (R) neck; (R) scalp, 2 (L) neck; shave biopsy Superficial basal cell carcinoma Dr. Danella Sensing  . Colonoscopy with propofol N/A 10/19/2015    Procedure: COLONOSCOPY WITH PROPOFOL;  Surgeon: Gatha Mayer, MD;  Location: [facility];  Service: Endoscopy;  Laterality: N/A;   Family History  Problem Relation Age of Onset  . Heart attack Father   . Heart disease Father   . Hypertension Sister   . Heart disease Sister   . Heart disease  Brother    Social History  Substance Use Topics  . Smoking status: Never Smoker   . Smokeless tobacco: Never Used  . Alcohol Use: Yes     Comment: occasional wine   OB History    No data available     Review of Systems  Constitutional: Negative for fever, activity change, appetite change and fatigue.  Eyes: Negative for pain.  Respiratory: Negative for chest tightness and shortness of breath.   Cardiovascular: Negative for chest pain.  Gastrointestinal: Negative for nausea, vomiting, diarrhea, constipation and blood in stool.  Genitourinary: Negative for dysuria.  Musculoskeletal: Positive for myalgias. Negative for back pain, arthralgias, gait problem, stiffness and neck pain.  Skin: Negative for itching, rash and wound.  Neurological: Negative for dizziness, tremors, numbness and headaches.  Psychiatric/Behavioral: Negative for confusion.      Allergies  Iodinated diagnostic agents; Sulfamethoxazole-trimethoprim; Bactrim; and Relafen  Home Medications   Prior to Admission medications   Medication Sig Start Date End Date Taking? Authorizing Provider  atenolol (TENORMIN) 25 MG tablet Take 1/2 tablet by mouth once daily To treat high blood pressure and angina. Patient taking differently: Take 12.5 mg by mouth daily. Take 1/2 tablet by mouth once daily To treat high blood pressure and angina. 07/06/15  Yes Tiffany L Reed, DO  calcium carbonate (OS-CAL) 600 MG TABS Take 600 mg by mouth 2 (two) times daily with a meal.    Yes Historical Provider, MD  cholecalciferol (VITAMIN D) 1000 UNITS tablet Take 1,000 Units by mouth daily. For Vitamin D supplement.   Yes Historical Provider, MD  Coenzyme Q10 (Q-10 CO-ENZYME PO) Take 1 Dose by mouth daily.   Yes Historical Provider, MD  dabigatran (PRADAXA) 150 MG CAPS capsule Take 150 mg by mouth 2 (two) times daily.   Yes Historical Provider, MD  diclofenac sodium (VOLTAREN) 1 % GEL Apply 2 g topically 4 (four) times daily. 10/20/15  Yes  Geradine Girt, DO  digoxin (LANOXIN) 0.125 MG tablet Take one tablet by mouth every other day to control heart rhythm Patient taking differently: Take 0.125 mg by mouth every other day. Take one tablet by mouth every other day to control heart rhythm (at night) 03/05/15  Yes [name], MD  hydrocortisone-pramoxine [facility]) 2.5-1 % rectal cream Place rectally 3 (three) times daily as needed for hemorrhoids or itching. 10/20/15  Yes Geradine Girt, DO  LORazepam (ATIVAN) 1 MG tablet Take 1/2 tablet (0.5mg ) by mouth at bedtime Patient taking differently: Take 0.5 mg by mouth at bedtime. Take 1/2 tablet (0.5mg ) by mouth at bedtime 10/20/15  Yes Geradine Girt, DO  simvastatin (ZOCOR) 20 MG tablet Take one tablet by mouth once daily for cholesterol Patient taking differently: Take 20 mg by mouth daily at 6 PM. Take one tablet by mouth once daily for cholesterol at night 07/06/15  Yes Tiffany L  Reed, DO  HYDROcodone-acetaminophen (VICODIN ES) 7.5-750 MG per tablet One every 6 hours if needed for pain Patient not taking: Reported on 10/27/2015 07/09/14   Tiffany L Reed, DO   BP 171/102 mmHg  Pulse 101  Temp(Src) 97.7 F (36.5 C) (Oral)  Resp 22  Ht 5\' 6"  (1.676 m)  Wt 57.607 kg  BMI 20.51 kg/m2  SpO2 96% Physical Exam  Constitutional: She is oriented to person, place, and time. She appears well-developed and well-nourished. No distress.  HENT:  Head: Normocephalic and atraumatic.  Eyes: Conjunctivae and EOM are normal. Pupils are equal, round, and reactive to light.  Neck: Normal range of motion. Neck supple.  Cardiovascular: Normal rate.  An irregularly irregular rhythm present. Exam reveals no decreased pulses.   Pulmonary/Chest: Effort normal and breath sounds normal. No respiratory distress. She has no wheezes. She has no rales. She exhibits no tenderness.  Abdominal: Soft. Bowel sounds are normal. She exhibits no distension and no mass. There is no tenderness. There is no rebound and  no guarding.  Musculoskeletal:       Right knee: She exhibits normal range of motion, no swelling, no effusion, no deformity and no erythema. Tenderness found.       Right lower leg: She exhibits tenderness and swelling. She exhibits no deformity.       Legs: Neurological: She is alert and oriented to person, place, and time. No cranial nerve deficit or sensory deficit. GCS eye subscore is 4. GCS verbal subscore is 5. GCS motor subscore is 6.  Skin: Skin is warm and dry. She is not diaphoretic.  Psychiatric: She has a normal mood and affect.    ED Course  Procedures (including critical care time) Labs Review Labs Reviewed  CBC WITH DIFFERENTIAL/PLATELET - Abnormal; Notable for the following:    Hemoglobin 11.5 (*)    Monocytes Absolute 1.6 (*)    All other components within normal limits  COMPREHENSIVE METABOLIC PANEL - Abnormal; Notable for the following:    Glucose, Bld 105 (*)    Total Protein 6.3 (*)    Albumin 3.1 (*)    All other components within normal limits    Imaging Review No results found. I have personally reviewed and evaluated these images and lab results as part of my medical decision-making.   EKG Interpretation   Date/Time:  Wednesday October 27 2015 21:33:15 EST Ventricular Rate:  92 PR Interval:    QRS Duration: 111 QT Interval:  362 QTC Calculation: 448 R Axis:   -78 Text Interpretation:  Atrial fibrillation Left anterior fascicular block  Consider RVH w/ secondary repol abnormality No previous tracing Confirmed  by BEATON  MD, ROBERT ([id number]) on 10/27/2015 10:07:58 PM      MDM   Final diagnoses:  None    81 year old Caucasian female with past medical history of atrial fibrillation currently on Pradaxa, hypertension who presents in the setting of right lower extremities swelling and elevated blood pressure. Patient reports she was seen by PCP today and setting of right lower leg swelling for approximately 3 weeks was noted at that time to have  elevated blood pressure. Due to these complaints she was advised to go to emergency department for further evaluation. On arrival patient hemodynamically stable but did have elevated blood pressure. Patient found to be in nature fibrillation without RVR. Patient denies any headache, vision change, chest pain, shortness of breath, abdominal pain, nausea, vomiting, conservation of diarrhea, dysuria. She denies any trauma to lower extremity. She  was admitted to the hospital several weeks ago and her anticoagulation was stopped at that time. However it was restarted 3 days ago and swelling in her legs started before discontinuation of anticoagulation. Patient denies any history of blood clots in the past.  EKG revealed atrial fibrillation without signs of acute ischemia. Due to elevated blood pressure lab analysis obtained which revealed no signs of end organ dysfunction including no significant elevation in creatinine. Patient continued to be symptom-free while in the emergency department. Unfortunately DVT studies cannot be obtained at this time and the evening. Additionally patient is already on anticoagulation did not believe patient needs further treatment at this time. However we will set up outpatient for DVT study as outpatient tomorrow. Additionally patient given IV antihypertensive and emergency department with improvement in blood pressure. Patient advised to increase atenolol at home and HCTZ was added to patient's antihypertensive regimen. Patient advised to follow with PCP in one to 2 days for reevaluation. Patient given strict return precautions and stable at time of discharge. Patient and family in agreement with plan.   Attending has seen and evaluated patient and Dr. Audie Pinto is in agreement with plan.     Esaw Grandchild, MD 10/27/15 [id number]  Leonard Schwartz, MD 10/27/15 [phone]

## 2015-10-28 ENCOUNTER — Ambulatory Visit (HOSPITAL_COMMUNITY)
Admission: RE | Admit: 2015-10-28 | Discharge: 2015-10-28 | Disposition: A | Discharge status: Home / Self Care | Payer: Medicare Other | Source: Ambulatory Visit | Attending: Emergency Medicine | Admitting: Emergency Medicine

## 2015-10-28 ENCOUNTER — Ambulatory Visit (HOSPITAL_COMMUNITY): Payer: Medicare Other

## 2015-10-28 DIAGNOSIS — I10 Essential (primary) hypertension: Secondary | ICD-10-CM | POA: Insufficient documentation

## 2015-10-28 DIAGNOSIS — M7989 Other specified soft tissue disorders: Secondary | ICD-10-CM | POA: Diagnosis not present

## 2015-10-28 NOTE — Progress Notes (Signed)
Preliminary results by tech - Right Lower Ext. Venous Duplex Completed. Negative for deep and superficial vein thrombosis in the right leg.  Manoah Deckard, BS, RDMS, RVT  

## 2015-11-02 ENCOUNTER — Telehealth: Payer: Self-pay | Admitting: Internal Medicine

## 2015-11-02 NOTE — Telephone Encounter (Signed)
New Message  Pt would like to discuss the insurance for a medication.   [facility] mail [facility]   [phone]

## 2015-11-03 ENCOUNTER — Encounter: Payer: Medicare Other | Admitting: Internal Medicine

## 2015-11-03 NOTE — Telephone Encounter (Signed)
Pradaxa will no longer be a covered drug next year. Insurance prefers Eliquis, Xarelto or Warfarin. She would rather not go back on Warfarin.

## 2015-11-05 NOTE — Telephone Encounter (Signed)
Patient is calling back to see if Dr Lovena Le has made a decision on what medication to start her on. She wanted to make sure he has not forgot about her. I let her know he has not made a decision as of yet but we will let her know as soon as possible what medication he has decided, how she is to take it and when she will start it. She wants a call back as soon as possible.  I will forward this message to both Dr Lovena Le and Claiborne Billings.

## 2015-11-08 NOTE — Telephone Encounter (Signed)
Eliquis 2.5 mg twice daily. GT

## 2015-11-09 MED ORDER — APIXABAN 2.5 MG PO TABS: 2.5000 mg | ORAL_TABLET | Freq: Two times a day (BID) | ORAL | Status: DC

## 2015-11-09 NOTE — Addendum Note (Signed)
Addended by: Janan Halter F on: 11/09/2015 08:54 AM   Modules accepted: Orders, Medications

## 2015-11-10 ENCOUNTER — Non-Acute Institutional Stay: Payer: Medicare Other | Admitting: Internal Medicine

## 2015-11-10 ENCOUNTER — Encounter: Payer: Self-pay | Admitting: Internal Medicine

## 2015-11-10 VITALS — BP 120/72 | HR 84 | Temp 97.4°F | Ht 66.0 in | Wt 123.0 lb

## 2015-11-10 DIAGNOSIS — M7551 Bursitis of right shoulder: Secondary | ICD-10-CM

## 2015-11-10 DIAGNOSIS — Z789 Other specified health status: Secondary | ICD-10-CM | POA: Diagnosis not present

## 2015-11-10 DIAGNOSIS — I10 Essential (primary) hypertension: Secondary | ICD-10-CM

## 2015-11-10 DIAGNOSIS — M8580 Other specified disorders of bone density and structure, unspecified site: Secondary | ICD-10-CM

## 2015-11-10 DIAGNOSIS — M79661 Pain in right lower leg: Secondary | ICD-10-CM | POA: Diagnosis not present

## 2015-11-10 DIAGNOSIS — I482 Chronic atrial fibrillation, unspecified: Secondary | ICD-10-CM

## 2015-11-10 DIAGNOSIS — M899 Disorder of bone, unspecified: Secondary | ICD-10-CM

## 2015-11-10 DIAGNOSIS — M7989 Other specified soft tissue disorders: Secondary | ICD-10-CM | POA: Diagnosis not present

## 2015-11-10 NOTE — Progress Notes (Signed)
Patient ID: Susan Conway, female   DOB: 1929/06/27, 82 y.o.   MRN: [id number]   Location: [facility] Provider: Yarnell Arvidson L. Mariea Clonts, D.O., C.M.D.  Code Status: DNR Goals of Care: Advanced Directive information Does patient have an advance directive?: Yes, Type of Advance Directive: [place];Living will;Out of facility DNR (pink MOST or yellow form), Pre-existing out of facility DNR order (yellow form or pink MOST form): Yellow form placed in chart (order not valid for inpatient use)  Chief Complaint  Patient presents with  . Hospitalization Follow-up    went to ER 10/27/15 for elevated blood pressure and red painful right leg. Her leg is doing better    HPI: Patient is a 82 y.o. female seen in the office today for ED f/u and med mgt of chronic diseases.  She was not admitted. See below.  ED records were reviewed.  Had flaky spot on right cheek I looked at. Has neme cure which she got a sample of.  Pure natural oils in it.  It's gotten smoother.  Nose gets red and dry so putting it on there, too.    BP had been elevated 12/21.  Could not get venous doppler done of her right leg b/c there was no one to do it after 7pm--apparently the case for "30 years".  Got next day and that was negative for DVT.  Right leg pain and swelling is better interestingly w/o any intervention except the diuretic for her blood pressure.  Is on the hctz now which helped with the edema.  Is now on a full atenolol instead of 1/2.  No dizziness.    Using cocoa butter for her dry skin and it's been quite helpful and she likes the smell.    Afib:  Has been on Pradaxa.  Had been trying to get cardiology to make decision due to coverage problem now on insurance.  Eliquis was then recommended.  Asks about 2 bottles of pradaxa she has--advised to finish them and then pick up eliquis prescription when completed.  Bursitis is much better.  Uses the voltaren 1-2x per day on right shoulder.    Hemorrhoids  are much better with proctosol.  Has her appt with Dr. Brantley Stage later in the month.  CoQ10 very helpful for her leg cramps.  Only 1 cramp in past 3-4 days when before  had been having each morning.  Osteopenia:  Previously was on fosamax. Last bone density 2012.  On ca and vitamin D.  Agrees to recheck.  Review of Systems:  Review of Systems  Constitutional: Negative for fever, chills and malaise/fatigue.  Eyes: Negative for blurred vision.       Glasses  Respiratory: Negative for shortness of breath.   Cardiovascular: Negative for chest pain, palpitations and leg swelling.       Edema of right leg much better, no redness, no pain  Gastrointestinal: Negative for abdominal pain, diarrhea, constipation, blood in stool and melena.  Genitourinary: Negative for dysuria, urgency and frequency.  Musculoskeletal: Negative for falls.  Skin: Positive for rash. Negative for itching.       Right scaly area on cheek  Neurological: Negative for dizziness, weakness and headaches.  Endo/Heme/Allergies: Bruises/bleeds easily.  Psychiatric/Behavioral: Negative for depression and memory loss.    Past Medical History  Diagnosis Date  . PVC's (premature ventricular contractions)   . MI, old   . Osteoporosis   . Pain in joint, shoulder region 08/12/2012  . Other disorder of muscle, ligament,  and fascia 04/08/2012  . Other abnormal blood chemistry 04/08/2012  . Degeneration of lumbar or lumbosacral intervertebral disc 01/31/2012  . Cervicalgia 01/31/2012  . Pain in thoracic spine 01/31/2012  . Pathologic fracture of vertebrae ([place]) 01/22/2012  . Myalgia and myositis, unspecified 01/17/2012  . Dizziness and giddiness 11/08/2011    vertigo  . Rash and other nonspecific skin eruption 08/02/2011  . Pain in limb 01/11/2011  . Pain in joint, upper arm 12/26/2010  . Atrial fibrillation ([place]) 09/21/2010  . Long term (current) use of anticoagulants 09/2010  . Other specified cardiac dysrhythmias(427.89) 06/27/2010  .  External hemorrhoids without mention of complication 123XX123  . Varicose veins of lower extremities 08/16/2009  . Ganglion of tendon sheath 08/16/2009  . Cramp of limb 08/16/2009  . Disturbance of skin sensation 08/2009  . Lumbago 07/2009  . Meralgia paresthetica 07/2007  . Closed fracture of lumbar vertebra without mention of spinal cord injury 07/17/2005  . Unspecified essential hypertension 07/17/1997  . Coronary atherosclerosis of native coronary artery 07/17/1997  . Senile osteoporosis 07/17/1993  . Bell's palsy 07/18/1979  . Conjunctiva disorder 12/26/2010  . Varicose veins of lower extremities 08/16/2009  . External hemorrhoids without mention of complication A999333  . Other abnormal blood chemistry 2013    hyperglycemia  . Basal cell carcinoma 05/25/2014    Multiple removed by Dr. Danella Sensing in March 2015: right neck, left neck, scalp   . Actinic keratosis 05/25/2014  . Pain in joint, ankle and foot 10/27/2013    Bilateral since 1998   . Leg cramp 10/27/2013    Most frequently the right leg.   Marland Kitchen Anxiety     Past Surgical History  Procedure Laterality Date  . Tonsillectomy  1940's  . Coronary artery bypass graft  1998    x2; LIMA to LAD; SVG to diagonal off bypass  . Hemorrhoid surgery  08/26/2012    Dr. Brantley Stage  . Skin biopsy  01/29/14    (R) neck; (R) scalp, 2 (L) neck; shave biopsy Superficial basal cell carcinoma Dr. Danella Sensing  . Colonoscopy with propofol N/A 10/19/2015    Procedure: COLONOSCOPY WITH PROPOFOL;  Surgeon: Gatha Mayer, MD;  Location: [facility];  Service: Endoscopy;  Laterality: N/A;    Allergies  Allergen Reactions  . Iodinated Diagnostic Agents     Severe nausea and also passed out  . Sulfamethoxazole-Trimethoprim     Rash   . Bactrim     rash  . Relafen [Nabumetone] Rash      Medication List       This list is accurate as of: 11/10/15 11:28 AM.  Always use your most recent med list.               apixaban 2.5 MG Tabs tablet   Commonly known as:  ELIQUIS  Take 1 tablet (2.5 mg total) by mouth 2 (two) times daily.     atenolol 25 MG tablet  Commonly known as:  TENORMIN  Take 1 ablet by mouth once daily To treat high blood pressure and angina.     calcium carbonate 600 MG Tabs tablet  Commonly known as:  OS-CAL  Take 600 mg by mouth 2 (two) times daily with a meal.     cholecalciferol 1000 units tablet  Commonly known as:  VITAMIN D  Take 1,000 Units by mouth daily. For Vitamin D supplement.     diclofenac sodium 1 % Gel  Commonly known as:  VOLTAREN  Apply 2 g  topically 4 (four) times daily.     digoxin 0.125 MG tablet  Commonly known as:  LANOXIN  Take one tablet by mouth every other day to control heart rhythm     hydrochlorothiazide 25 MG tablet  Commonly known as:  HYDRODIURIL  Take 1 tablet (25 mg total) by mouth daily.     HYDROcodone-acetaminophen 7.5-750 MG tablet  Commonly known as:  VICODIN ES  One every 6 hours if needed for pain     hydrocortisone-pramoxine 2.5-1 % rectal cream  Commonly known as:  ANALPRAM-HC  Place rectally 3 (three) times daily as needed for hemorrhoids or itching.     LORazepam 1 MG tablet  Commonly known as:  ATIVAN  Take 1/2 tablet (0.5mg ) by mouth at bedtime     Q-10 CO-ENZYME PO  Take 1 Dose by mouth daily.     simvastatin 20 MG tablet  Commonly known as:  ZOCOR  Take one tablet by mouth once daily for cholesterol        Health Maintenance  Topic Date Due  . INFLUENZA VACCINE  06/06/2016  . PNA vac Low Risk Adult (2 of 2 - PPSV23) 09/21/2016  . TETANUS/TDAP  11/06/2017  . DEXA SCAN  Completed  . ZOSTAVAX  Completed    Physical Exam: Filed Vitals:   11/10/15 1059  BP: 120/72  Pulse: 84  Temp: 97.4 F (36.3 C)  TempSrc: Oral  Height: 5\' 6"  (1.676 m)  Weight: 123 lb (55.792 kg)  SpO2: 99%   Body mass index is 19.86 kg/(m^2). Physical Exam  Constitutional: She is oriented to person, place, and time. She appears well-developed and  well-nourished. No distress.  HENT:  Head: Normocephalic and atraumatic.  Cardiovascular: Intact distal pulses.   Edema resolved in right leg; irreg irreg  Pulmonary/Chest: Effort normal and breath sounds normal. No respiratory distress.  Abdominal: Soft. Bowel sounds are normal. She exhibits no distension. There is no tenderness.  Musculoskeletal: Normal range of motion.  Tenderness of right posterior leg resolved  Neurological: She is alert and oriented to person, place, and time.  Skin: Skin is warm and dry. There is pallor.  Scaly circular keratosis on right cheek  Psychiatric: She has a normal mood and affect. Her behavior is normal. Judgment and thought content normal.    Labs reviewed: Basic Metabolic Panel:  Recent Labs  09/14/15  10/19/15 0116 10/20/15 0410 10/27/15 2027  NA 142  < > 141 137 142  K 4.2  < > 3.4* 3.2* 4.0  CL  --   < > 109 107 102  CO2  --   < > 24 25 31   GLUCOSE  --   < > 108* 141* 105*  BUN 19  < > 12 10 14   CREATININE 0.9  < > 0.58 0.71 0.78  CALCIUM  --   < > 8.0* 7.9* 9.4  TSH 3.80  --   --   --   --   < > = values in this interval not displayed. Liver Function Tests:  Recent Labs  09/14/15 10/17/15 2138 10/27/15 2027  AST 20 22 22   ALT 11 12* 19  ALKPHOS 63 58 64  BILITOT  --  1.0 0.3  PROT  --  7.5 6.3*  ALBUMIN  --  3.9 3.1*   No results for input(s): LIPASE, AMYLASE in the last 8760 hours. No results for input(s): AMMONIA in the last 8760 hours. CBC:  Recent Labs  10/18/15 0235  10/19/15 0116 10/20/15  0410 10/27/15 2027  WBC 11.0*  --   --  7.3 6.5  NEUTROABS  --   --   --   --  3.7  HGB 11.8*  < > 11.3* 11.1* 11.5*  HCT 36.0  < > 35.1* 34.1* 36.5  MCV 89.6  --   --  88.3 90.3  PLT 137*  --   --  124* 190  < > = values in this interval not displayed. Lipid Panel:  Recent Labs  09/14/15  CHOL 134  HDL 67  LDLCALC 67  TRIG 52   Procedures since last visit: Right knee xray: 10/12/15 no acute bony abn.  Mild  degenerative changes and small joint effusion. Abdominal xray: 10/18/15  Prominent rectal stool. No obstruction.  Assessment/Plan 1. Osteopenia, senile - by last bone density -cont calcium and vitamin D and reassess bone density -was on fosamax before and now will probably need prolia or reclast - DG Bone Density; Future  2. Essential hypertension -bp at goal with now with and full atenolol tablet  3. Pain and swelling of lower leg, right -resolved  4. Chronic atrial fibrillation (HCC) - cont pradaxa until runs out, then begin eliquis immediately -need to check dig level after her next visit -rate is controlled and asymptomatic  5. Shoulder bursitis, right -better with time and use of voltaren gel  6. Active advance directive - DNR (Do Not Resuscitate) on file  Labs/tests ordered:  No new Next appt:  Keep may physical as scheduled--will order dig level for before her next appt after that if cardiology does not do it  [place]. Elijan Googe, D.O. [facility] [address], [place] [address] Cell Phone (Mon-Fri 8am-5pm):  [phone] On Call:  [phone] & follow prompts after 5pm & weekends Office Phone:  [phone] Office Fax:  [phone]

## 2015-11-12 DIAGNOSIS — M67432 Ganglion, left wrist: Secondary | ICD-10-CM | POA: Diagnosis not present

## 2015-11-16 ENCOUNTER — Telehealth: Payer: Self-pay | Admitting: Internal Medicine

## 2015-11-16 ENCOUNTER — Other Ambulatory Visit: Payer: Self-pay | Admitting: Orthopedic Surgery

## 2015-11-16 NOTE — Telephone Encounter (Signed)
Cleared low risk per Dr Lovena Le.  May hold Pradaxa 2 days prior.  Will fax over clearance

## 2015-11-16 NOTE — Telephone Encounter (Signed)
New message      Request for surgical clearance:  What type of surgery is being performed? Wrist excision 1. When is this surgery scheduled? 11-23-15  Are there any medications that need to be held prior to surgery and how long?cardiac clearance and stop eliquis prior 2. Name of physician performing surgery? Dr Leanora Cover  3. What is your office phone and fax number?  Fax [phone] 4. They have faxed the clearance form several times but have not heard back from [place]

## 2015-11-17 ENCOUNTER — Encounter (HOSPITAL_BASED_OUTPATIENT_CLINIC_OR_DEPARTMENT_OTHER): Payer: Self-pay | Admitting: *Deleted

## 2015-11-17 DIAGNOSIS — H52203 Unspecified astigmatism, bilateral: Secondary | ICD-10-CM | POA: Diagnosis not present

## 2015-11-17 DIAGNOSIS — H2513 Age-related nuclear cataract, bilateral: Secondary | ICD-10-CM | POA: Diagnosis not present

## 2015-11-17 NOTE — Progress Notes (Signed)
Patient states she still has some of her Pradaxa and has not started her Eloquis yet, Dr Crissie Sickles is aware. There is a note from Dr Lovena Le stating for her to stop her blood thinner 2 days prior to hand surgery.

## 2015-11-22 ENCOUNTER — Telehealth: Payer: Self-pay | Admitting: *Deleted

## 2015-11-22 MED ORDER — HYDROCHLOROTHIAZIDE 25 MG PO TABS: 25.0000 mg | ORAL_TABLET | Freq: Every day | ORAL | Status: DC

## 2015-11-22 NOTE — Telephone Encounter (Signed)
Patient called and left message stating that she had concerns regarding blood pressure medication.  Tried calling patient back but no answer. Will try again later.

## 2015-11-22 NOTE — Telephone Encounter (Signed)
Patient stated that she needed a 30 day supply sent to local pharmacy and a 90 day supply sent to Mail Order

## 2015-11-23 ENCOUNTER — Encounter (HOSPITAL_BASED_OUTPATIENT_CLINIC_OR_DEPARTMENT_OTHER)
Admission: RE | Disposition: A | Discharge status: Home / Self Care | Payer: Self-pay | Source: Ambulatory Visit | Attending: Orthopedic Surgery

## 2015-11-23 ENCOUNTER — Ambulatory Visit (HOSPITAL_BASED_OUTPATIENT_CLINIC_OR_DEPARTMENT_OTHER): Payer: Medicare Other | Admitting: Anesthesiology

## 2015-11-23 ENCOUNTER — Ambulatory Visit (HOSPITAL_BASED_OUTPATIENT_CLINIC_OR_DEPARTMENT_OTHER)
Admission: RE | Admit: 2015-11-23 | Discharge: 2015-11-23 | Disposition: A | Discharge status: Home / Self Care | Payer: Medicare Other | Source: Ambulatory Visit | Attending: Orthopedic Surgery | Admitting: Orthopedic Surgery

## 2015-11-23 ENCOUNTER — Encounter (HOSPITAL_BASED_OUTPATIENT_CLINIC_OR_DEPARTMENT_OTHER): Payer: Self-pay

## 2015-11-23 DIAGNOSIS — Z7901 Long term (current) use of anticoagulants: Secondary | ICD-10-CM | POA: Insufficient documentation

## 2015-11-23 DIAGNOSIS — I4891 Unspecified atrial fibrillation: Secondary | ICD-10-CM | POA: Diagnosis not present

## 2015-11-23 DIAGNOSIS — I2510 Atherosclerotic heart disease of native coronary artery without angina pectoris: Secondary | ICD-10-CM | POA: Insufficient documentation

## 2015-11-23 DIAGNOSIS — M67432 Ganglion, left wrist: Secondary | ICD-10-CM | POA: Insufficient documentation

## 2015-11-23 DIAGNOSIS — M65832 Other synovitis and tenosynovitis, left forearm: Secondary | ICD-10-CM | POA: Diagnosis not present

## 2015-11-23 DIAGNOSIS — I10 Essential (primary) hypertension: Secondary | ICD-10-CM | POA: Insufficient documentation

## 2015-11-23 DIAGNOSIS — F419 Anxiety disorder, unspecified: Secondary | ICD-10-CM | POA: Insufficient documentation

## 2015-11-23 DIAGNOSIS — M65842 Other synovitis and tenosynovitis, left hand: Secondary | ICD-10-CM | POA: Diagnosis not present

## 2015-11-23 DIAGNOSIS — I25810 Atherosclerosis of coronary artery bypass graft(s) without angina pectoris: Secondary | ICD-10-CM | POA: Diagnosis not present

## 2015-11-23 DIAGNOSIS — Z955 Presence of coronary angioplasty implant and graft: Secondary | ICD-10-CM | POA: Diagnosis not present

## 2015-11-23 DIAGNOSIS — I252 Old myocardial infarction: Secondary | ICD-10-CM | POA: Insufficient documentation

## 2015-11-23 HISTORY — PX: MASS EXCISION: SHX2000

## 2015-11-23 SURGERY — EXCISION MASS
Anesthesia: General | Site: Wrist | Laterality: Left

## 2015-11-23 MED ORDER — LACTATED RINGERS IV SOLN: INTRAVENOUS | Status: DC

## 2015-11-23 MED ORDER — CEFAZOLIN SODIUM-DEXTROSE 2-3 GM-% IV SOLR
INTRAVENOUS | Status: DC | PRN
  Administered 2015-11-23: 2 g via INTRAVENOUS

## 2015-11-23 MED ORDER — DEXAMETHASONE SODIUM PHOSPHATE 10 MG/ML IJ SOLN
INTRAMUSCULAR | Status: AC
  Filled 2015-11-23: qty 1

## 2015-11-23 MED ORDER — TRAMADOL HCL 50 MG PO TABS: 50.0000 mg | ORAL_TABLET | Freq: Four times a day (QID) | ORAL | Status: DC | PRN

## 2015-11-23 MED ORDER — BUPIVACAINE HCL (PF) 0.25 % IJ SOLN
INTRAMUSCULAR | Status: DC | PRN
  Administered 2015-11-23: 7 mL

## 2015-11-23 MED ORDER — FENTANYL CITRATE (PF) 100 MCG/2ML IJ SOLN
INTRAMUSCULAR | Status: DC | PRN
  Administered 2015-11-23: 100 ug via INTRAVENOUS

## 2015-11-23 MED ORDER — FENTANYL CITRATE (PF) 100 MCG/2ML IJ SOLN
INTRAMUSCULAR | Status: AC
  Filled 2015-11-23: qty 2

## 2015-11-23 MED ORDER — ONDANSETRON HCL 4 MG/2ML IJ SOLN
INTRAMUSCULAR | Status: DC | PRN
  Administered 2015-11-23: 4 mg via INTRAVENOUS

## 2015-11-23 MED ORDER — LIDOCAINE HCL (CARDIAC) 20 MG/ML IV SOLN
INTRAVENOUS | Status: DC | PRN
  Administered 2015-11-23: 50 mg via INTRAVENOUS

## 2015-11-23 MED ORDER — CEFAZOLIN SODIUM-DEXTROSE 2-3 GM-% IV SOLR
INTRAVENOUS | Status: AC
  Filled 2015-11-23: qty 50

## 2015-11-23 MED ORDER — DEXAMETHASONE SODIUM PHOSPHATE 4 MG/ML IJ SOLN
INTRAMUSCULAR | Status: DC | PRN
  Administered 2015-11-23: 10 mg via INTRAVENOUS

## 2015-11-23 MED ORDER — ALBUTEROL SULFATE HFA 108 (90 BASE) MCG/ACT IN AERS
INHALATION_SPRAY | RESPIRATORY_TRACT | Status: AC
  Filled 2015-11-23: qty 6.7

## 2015-11-23 MED ORDER — FENTANYL CITRATE (PF) 100 MCG/2ML IJ SOLN: 25.0000 ug | INTRAMUSCULAR | Status: DC | PRN

## 2015-11-23 MED ORDER — ONDANSETRON HCL 4 MG/2ML IJ SOLN
INTRAMUSCULAR | Status: AC
  Filled 2015-11-23: qty 2

## 2015-11-23 MED ORDER — BUPIVACAINE HCL (PF) 0.25 % IJ SOLN
INTRAMUSCULAR | Status: AC
  Filled 2015-11-23: qty 30

## 2015-11-23 MED ORDER — LACTATED RINGERS IV SOLN
INTRAVENOUS | Status: DC | PRN
  Administered 2015-11-23 (×2): via INTRAVENOUS

## 2015-11-23 MED ORDER — PROPOFOL 10 MG/ML IV BOLUS
INTRAVENOUS | Status: DC | PRN
  Administered 2015-11-23 (×2): 100 mg via INTRAVENOUS

## 2015-11-23 SURGICAL SUPPLY — 57 items
BANDAGE ACE 3X5.8 VEL STRL LF (GAUZE/BANDAGES/DRESSINGS) ×3 IMPLANT
BANDAGE COBAN STERILE 2 (GAUZE/BANDAGES/DRESSINGS) IMPLANT
BENZOIN TINCTURE PRP APPL 2/3 (GAUZE/BANDAGES/DRESSINGS) IMPLANT
BLADE MINI RND TIP GREEN BEAV (BLADE) IMPLANT
BLADE SURG 15 STRL LF DISP TIS (BLADE) ×2 IMPLANT
BLADE SURG 15 STRL SS (BLADE) ×4
BNDG COHESIVE 1X5 TAN STRL LF (GAUZE/BANDAGES/DRESSINGS) IMPLANT
BNDG CONFORM 2 STRL LF (GAUZE/BANDAGES/DRESSINGS) IMPLANT
BNDG ELASTIC 2 VLCR STRL LF (GAUZE/BANDAGES/DRESSINGS) IMPLANT
BNDG ESMARK 4X9 LF (GAUZE/BANDAGES/DRESSINGS) ×3 IMPLANT
BNDG GAUZE 1X2.1 STRL (MISCELLANEOUS) IMPLANT
BNDG GAUZE ELAST 4 BULKY (GAUZE/BANDAGES/DRESSINGS) ×3 IMPLANT
BNDG PLASTER X FAST 3X3 WHT LF (CAST SUPPLIES) ×3 IMPLANT
CHLORAPREP W/TINT 26ML (MISCELLANEOUS) ×3 IMPLANT
CLOSURE WOUND 1/2 X4 (GAUZE/BANDAGES/DRESSINGS)
CORDS BIPOLAR (ELECTRODE) ×3 IMPLANT
COVER BACK TABLE 60X90IN (DRAPES) ×3 IMPLANT
COVER MAYO STAND STRL (DRAPES) ×3 IMPLANT
CUFF TOURNIQUET SINGLE 18IN (TOURNIQUET CUFF) ×3 IMPLANT
DRAPE EXTREMITY T 121X128X90 (DRAPE) ×3 IMPLANT
DRAPE SURG 17X23 STRL (DRAPES) ×3 IMPLANT
DRSG PAD ABDOMINAL 8X10 ST (GAUZE/BANDAGES/DRESSINGS) ×3 IMPLANT
GAUZE SPONGE 4X4 12PLY STRL (GAUZE/BANDAGES/DRESSINGS) ×3 IMPLANT
GAUZE XEROFORM 1X8 LF (GAUZE/BANDAGES/DRESSINGS) ×3 IMPLANT
GLOVE BIO SURGEON STRL SZ7.5 (GLOVE) ×3 IMPLANT
GLOVE BIOGEL PI IND STRL 7.0 (GLOVE) ×1 IMPLANT
GLOVE BIOGEL PI IND STRL 8 (GLOVE) ×1 IMPLANT
GLOVE BIOGEL PI IND STRL 8.5 (GLOVE) ×1 IMPLANT
GLOVE BIOGEL PI INDICATOR 7.0 (GLOVE) ×2
GLOVE BIOGEL PI INDICATOR 8 (GLOVE) ×2
GLOVE BIOGEL PI INDICATOR 8.5 (GLOVE) ×2
GLOVE ECLIPSE 6.5 STRL STRAW (GLOVE) ×3 IMPLANT
GLOVE EXAM NITRILE EXT CUFF MD (GLOVE) ×3 IMPLANT
GLOVE SURG ORTHO 8.0 STRL STRW (GLOVE) ×3 IMPLANT
GOWN STRL REUS W/ TWL LRG LVL3 (GOWN DISPOSABLE) ×1 IMPLANT
GOWN STRL REUS W/TWL LRG LVL3 (GOWN DISPOSABLE) ×2
GOWN STRL REUS W/TWL XL LVL3 (GOWN DISPOSABLE) ×6 IMPLANT
NEEDLE HYPO 25X1 1.5 SAFETY (NEEDLE) ×3 IMPLANT
NS IRRIG 1000ML POUR BTL (IV SOLUTION) ×3 IMPLANT
PACK BASIN DAY SURGERY FS (CUSTOM PROCEDURE TRAY) ×3 IMPLANT
PAD CAST 3X4 CTTN HI CHSV (CAST SUPPLIES) IMPLANT
PAD CAST 4YDX4 CTTN HI CHSV (CAST SUPPLIES) IMPLANT
PADDING CAST ABS 4INX4YD NS (CAST SUPPLIES) ×2
PADDING CAST ABS COTTON 4X4 ST (CAST SUPPLIES) ×1 IMPLANT
PADDING CAST COTTON 3X4 STRL (CAST SUPPLIES)
PADDING CAST COTTON 4X4 STRL (CAST SUPPLIES)
STOCKINETTE 4X48 STRL (DRAPES) ×3 IMPLANT
STRIP CLOSURE SKIN 1/2X4 (GAUZE/BANDAGES/DRESSINGS) IMPLANT
SUT ETHILON 3 0 PS 1 (SUTURE) IMPLANT
SUT ETHILON 4 0 PS 2 18 (SUTURE) ×3 IMPLANT
SUT ETHILON 5 0 P 3 18 (SUTURE)
SUT NYLON ETHILON 5-0 P-3 1X18 (SUTURE) IMPLANT
SUT VIC AB 4-0 P2 18 (SUTURE) ×3 IMPLANT
SYR BULB 3OZ (MISCELLANEOUS) ×3 IMPLANT
SYR CONTROL 10ML LL (SYRINGE) ×3 IMPLANT
TOWEL OR 17X24 6PK STRL BLUE (TOWEL DISPOSABLE) ×6 IMPLANT
UNDERPAD 30X30 (UNDERPADS AND DIAPERS) ×3 IMPLANT

## 2015-11-23 NOTE — Op Note (Signed)
I assisted Surgeon(s) and Role:    * Leanora Cover, MD - Primary    * Daryll Brod, MD - Assisting on the Procedure(s): LEFT WRIST EXCISION CYST on 11/23/2015.  I provided assistance on this case as follows: approach, aided in retraction, tendon debridement, closure and dressingand splint application. I was present for the entire time.  Electronically signed by: Wynonia Sours, MD Date: 11/23/2015 Time: 2:38 PM

## 2015-11-23 NOTE — Op Note (Signed)
[id number] 

## 2015-11-23 NOTE — Anesthesia Postprocedure Evaluation (Signed)
Anesthesia Post Note  Patient: Susan Conway  Procedure(s) Performed: Procedure(s) (LRB): LEFT WRIST EXCISION CYST (Left)  Patient location during evaluation: PACU Anesthesia Type: General Level of consciousness: awake and alert Pain management: pain level controlled Vital Signs Assessment: post-procedure vital signs reviewed and stable Respiratory status: spontaneous breathing, nonlabored ventilation, respiratory function stable and patient connected to nasal cannula oxygen Cardiovascular status: blood pressure returned to baseline and stable Postop Assessment: no signs of nausea or vomiting Anesthetic complications: no    Last Vitals:  Filed Vitals:   11/23/15 1500 11/23/15 1515  BP: 154/98   Pulse: 84 89  Temp:    Resp: 27 23    Last Pain: There were no vitals filed for this visit.               Effie Berkshire

## 2015-11-23 NOTE — Transfer of Care (Signed)
Immediate Anesthesia Transfer of Care Note  Patient: Susan Conway  Procedure(s) Performed: Procedure(s): LEFT WRIST EXCISION CYST (Left)  Patient Location: PACU  Anesthesia Type:General  Level of Consciousness: awake and patient cooperative  Airway & Oxygen Therapy: Patient Spontanous Breathing and Patient connected to face mask oxygen  Post-op Assessment: Report given to RN and Post -op Vital signs reviewed and stable  Post vital signs: Reviewed and stable  Last Vitals:  Filed Vitals:   11/23/15 1219  BP: 157/86  Pulse: 88  Temp: 36.4 C  Resp: 20    Complications: No apparent anesthesia complications

## 2015-11-23 NOTE — H&P (Signed)
Susan Conway is an 82 y.o. female.   Chief Complaint: left hand cyst HPI: 82 yo female with cyst on dorsum left hand.  This is bothersome to her and she wishes to have it removed.  Allergies:  Allergies  Allergen Reactions  . Iodinated Diagnostic Agents     Severe nausea and also passed out  . Sulfamethoxazole-Trimethoprim     Rash   . Bactrim     rash  . Relafen [Nabumetone] Rash    Past Medical History  Diagnosis Date  . PVC's (premature ventricular contractions)   . MI, old   . Osteoporosis   . Pain in joint, shoulder region 08/12/2012  . Other disorder of muscle, ligament, and fascia 04/08/2012  . Other abnormal blood chemistry 04/08/2012  . Degeneration of lumbar or lumbosacral intervertebral disc 01/31/2012  . Cervicalgia 01/31/2012  . Pain in thoracic spine 01/31/2012  . Pathologic fracture of vertebrae ([place]) 01/22/2012  . Myalgia and myositis, unspecified 01/17/2012  . Dizziness and giddiness 11/08/2011    vertigo  . Rash and other nonspecific skin eruption 08/02/2011  . Pain in limb 01/11/2011  . Pain in joint, upper arm 12/26/2010  . Atrial fibrillation ([place]) 09/21/2010  . Long term (current) use of anticoagulants 09/2010  . Other specified cardiac dysrhythmias(427.89) 06/27/2010  . External hemorrhoids without mention of complication [id number]  . Varicose veins of lower extremities 08/16/2009  . Ganglion of tendon sheath 08/16/2009  . Cramp of limb 08/16/2009  . Disturbance of skin sensation 08/2009  . Lumbago 07/2009  . Meralgia paresthetica 07/2007  . Closed fracture of lumbar vertebra without mention of spinal cord injury 07/17/2005  . Unspecified essential hypertension 07/17/1997  . Coronary atherosclerosis of native coronary artery 07/17/1997  . Senile osteoporosis 07/17/1993  . Bell's palsy 07/18/1979  . Conjunctiva disorder 12/26/2010  . Varicose veins of lower extremities 08/16/2009  . External hemorrhoids without mention of complication [id number]  . Other abnormal  blood chemistry 2013    hyperglycemia  . Basal cell carcinoma 05/25/2014    Multiple removed by Dr. Danella Sensing in March 2015: right neck, left neck, scalp   . Actinic keratosis 05/25/2014  . Pain in joint, ankle and foot 10/27/2013    Bilateral since 1998   . Leg cramp 10/27/2013    Most frequently the right leg.   Marland Kitchen Anxiety     Past Surgical History  Procedure Laterality Date  . Tonsillectomy  1940's  . Coronary artery bypass graft  1998    x2; LIMA to LAD; SVG to diagonal off bypass  . Hemorrhoid surgery  08/26/2012    Dr. Brantley Stage  . Skin biopsy  01/29/14    (R) neck; (R) scalp, 2 (L) neck; shave biopsy Superficial basal cell carcinoma Dr. Danella Sensing  . Colonoscopy with propofol N/A 10/19/2015    Procedure: COLONOSCOPY WITH PROPOFOL;  Surgeon: Gatha Mayer, MD;  Location: [facility];  Service: Endoscopy;  Laterality: N/A;    Family History: Family History  Problem Relation Age of Onset  . Heart attack Father   . Heart disease Father   . Hypertension Sister   . Heart disease Sister   . Heart disease Brother     Social History:   reports that she has never smoked. She has never used smokeless tobacco. She reports that she drinks alcohol. She reports that she does not use illicit drugs.  Medications: Medications Prior to Admission  Medication Sig Dispense Refill  . atenolol (TENORMIN) 25 MG tablet  Take 1 ablet by mouth once daily To treat high blood pressure and angina. 90 tablet 3  . calcium carbonate (OS-CAL) 600 MG TABS Take 600 mg by mouth 2 (two) times daily with a meal.     . cholecalciferol (VITAMIN D) 1000 UNITS tablet Take 1,000 Units by mouth daily. For Vitamin D supplement.    . Coenzyme Q10 (Q-10 CO-ENZYME PO) Take 1 Dose by mouth daily.    . diclofenac sodium (VOLTAREN) 1 % GEL Apply 2 g topically 4 (four) times daily. 100 g 9  . digoxin (LANOXIN) 0.125 MG tablet Take one tablet by mouth every other day to control heart rhythm (Patient taking  differently: Take 0.125 mg by mouth every other day. Take one tablet by mouth every other day to control heart rhythm (at night)) 90 tablet 3  . hydrochlorothiazide (HYDRODIURIL) 25 MG tablet Take 1 tablet (25 mg total) by mouth daily. 90 tablet 3  . hydrocortisone-pramoxine (ANALPRAM-HC) 2.5-1 % rectal cream Place rectally 3 (three) times daily as needed for hemorrhoids or itching. 30 g 0  . LORazepam (ATIVAN) 1 MG tablet Take 1/2 tablet (0.5mg ) by mouth at bedtime (Patient taking differently: Take 0.5 mg by mouth at bedtime. Take 1/2 tablet (0.5mg ) by mouth at bedtime) 90 tablet 0  . simvastatin (ZOCOR) 20 MG tablet Take one tablet by mouth once daily for cholesterol (Patient taking differently: Take 20 mg by mouth daily at 6 PM. Take one tablet by mouth once daily for cholesterol at night) 90 tablet 3  . apixaban (ELIQUIS) 2.5 MG TABS tablet Take 1 tablet (2.5 mg total) by mouth 2 (two) times daily. 60 tablet 11  . HYDROcodone-acetaminophen (VICODIN ES) 7.5-750 MG per tablet One every 6 hours if needed for pain 100 tablet 0    No results found for this or any previous visit (from the past 48 hour(s)).  No results found.   A comprehensive review of systems was negative except for: Hematologic/lymphatic: positive for bleeding and easy bruising Musculoskeletal: positive for back pain  Blood pressure 157/86, pulse 88, temperature 97.6 F (36.4 C), temperature source Oral, resp. rate 20, height 5\' 6"  (1.676 m), weight 55.339 kg (122 lb), SpO2 97 %.  General appearance: alert, cooperative and appears stated age Head: Normocephalic, without obvious abnormality, atraumatic Neck: supple, symmetrical, trachea midline Resp: clear to auscultation bilaterally Cardio: regular rate and rhythm GI: non-tender Extremities: intact sensation and capillary refill all digits.  +epl/fpl/io.  no wounds. Pulses: 2+ and symmetric Skin: Skin color, texture, turgor normal. No rashes or lesions Neurologic:  Grossly normal Incision/Wound:  none  Assessment/Plan Left wrist/hand dorsal ganglion cyst.  Non operative and operative treatment options were discussed with the patient and patient wishes to proceed with operative treatment. Risks, benefits, and alternatives of surgery were discussed and the patient agrees with the plan of care.   Caralynn Gelber R 11/23/2015, 1:37 PM

## 2015-11-23 NOTE — Op Note (Signed)
NAME:  Susan Conway, Susan Conway                   ACCOUNT NO.:  [id number]  MEDICAL RECORD NO.:  [id number]  LOCATION:                                 FACILITY:  PHYSICIAN:  Leanora Cover, MD        DATE OF BIRTH:  [birth year]  DATE OF PROCEDURE:  11/23/2015 DATE OF DISCHARGE:                              OPERATIVE REPORT   PREOPERATIVE DIAGNOSIS:  Left wrist dorsal ganglion.  POSTOPERATIVE DIAGNOSIS:  Wrist and hand tenosynovitis.  PROCEDURE:  Left dorsal hand and wrist tenosynovectomy of fourth dorsal compartment tendons.  SURGEON:  Leanora Cover, MD.  ASSISTANT:  Daryll Brod, MD.  ANESTHESIA:  General.  IV FLUIDS:  Per anesthesia flow sheet.  ESTIMATED BLOOD LOSS:  Minimal.  COMPLICATIONS:  None.  SPECIMENS:  Tenosynovium from left hand and EIP tendon to Pathology.  TOURNIQUET TIME:  29 minutes.  DISPOSITION:  Stable to PACU.  INDICATIONS:  Ms. Sellards is an 82 year old female who has had a mass in the dorsum of the left hand.  She wished to have this excised.  Risks, benefits, and alternatives of surgery were discussed including the risk of blood loss; infection; damage to nerves, vessels, tendons, ligaments, bone; failure of surgery; need for additional surgery; complications with wound healing, continued pain, recurrence of mass.  She voiced understanding of these risks and elected to proceed.  OPERATIVE COURSE:  After being identified preoperatively by myself, the patient and I agreed upon procedure and site of procedure.  Surgical site was marked.  The risks, benefits, and alternatives of surgery were reviewed and she wished to proceed.  Surgical consent has been signed. She was given IV Ancef as preoperative antibiotic prophylaxis.  She was transported to the operating room and placed on the operating room table in supine position with the left upper extremity on arm board.  General anesthesia was induced by Anesthesiology.  Left upper extremity was prepped and draped in normal  sterile orthopedic fashion.  Surgical pause was performed between surgeons, anesthesia, and operating room staff, and all were in agreement as to the patient, procedure, and site of procedure.  Tourniquet at the proximal aspect of the extremity was inflated to 250 mmHg after exsanguination of the limb with an Esmarch bandage.  Incision was made in the dorsum of the hand and carried into the subcutaneous tissues by spreading technique.  The nerve and vein tear to the dorsum of the mass were freed up and retracted and protected throughout the case.  The mass was identified.  It was surrounding the tendons.  It appeared to be tenosynovium.  This was removed with a combination of scissors sharply and rongeured and stripped it off.  In the EIP tendon, there was a mass that was defined as separate from the other synovium.  This was entered.  There was blood within it.  It was within the fibers of the tendon.  It was removed as best possible.  It was sent to pathology separately from the tenosynovium which was also sent to Pathology.  The tendons were intact.  The wound was then copiously irrigated with sterile saline.  Three inverted interrupted Vicryl sutures were  placed in the subcutaneous tissues, and the skin was closed with 4-0 nylon in a horizontal mattress fashion.  It was injected with 7 mL of 0.25% plain Marcaine to aid in postoperative analgesia.  It was then dressed with sterile Xeroform, 4x4s, and wrapped with Kerlix bandage.  A volar splint was placed and wrapped with Kerlix and Ace bandage.  Tourniquet was deflated at 29 minutes.  Fingertips were pink with brisk capillary refill after deflation of the tourniquet. Operative drapes were broken down, and the patient was awoken from anesthesia safely.  She was transferred back to stretcher and taken to PACU in stable condition.  I will see her back in the office in 1 week for postoperative followup.  I will give her Ultram 50 mg 1-2  p.o. q.6 hours p.r.n. pain, dispensed #30.     Leanora Cover, MD     KK/MEDQ  D:  11/23/2015  T:  11/23/2015  Job:  [id number]

## 2015-11-23 NOTE — Anesthesia Procedure Notes (Signed)
Procedure Name: LMA Insertion Date/Time: 11/23/2015 1:52 PM Performed by: Toula Moos L Pre-anesthesia Checklist: Patient identified, Emergency Drugs available, Suction available, Patient being monitored and Timeout performed Patient Re-evaluated:Patient Re-evaluated prior to inductionOxygen Delivery Method: Circle System Utilized Preoxygenation: Pre-oxygenation with 100% oxygen Intubation Type: IV induction Ventilation: Mask ventilation without difficulty LMA: LMA inserted LMA Size: 4.0 Number of attempts: 1 Airway Equipment and Method: Bite block Placement Confirmation: positive ETCO2 Tube secured with: Tape Dental Injury: Teeth and Oropharynx as per pre-operative assessment

## 2015-11-23 NOTE — Brief Op Note (Signed)
11/23/2015  2:37 PM  PATIENT:  Kelton Pillar  82 y.o. female  PRE-OPERATIVE DIAGNOSIS:  LEFT WRIST DORSAL GANGLION CYST  POST-OPERATIVE DIAGNOSIS:  LEFT WRIST DORSAL GANGLION CYST  PROCEDURE:  Procedure(s): LEFT WRIST EXCISION CYST (Left)  SURGEON:  Surgeon(s) and Role:    * Leanora Cover, MD - Primary    * Daryll Brod, MD - Assisting  PHYSICIAN ASSISTANT:   ASSISTANTS: Daryll Brod, MD   ANESTHESIA:   general  EBL:  Total I/O In: 1000 [I.V.:1000] Out: -   BLOOD ADMINISTERED:none  DRAINS: none   LOCAL MEDICATIONS USED:  MARCAINE     SPECIMEN:  Source of Specimen:  left dorsal hand  DISPOSITION OF SPECIMEN:  PATHOLOGY  COUNTS:  YES  TOURNIQUET:   Total Tourniquet Time Documented: Upper Arm (Left) - 29 minutes Total: Upper Arm (Left) - 29 minutes   DICTATION: .Other Dictation: Dictation Number [id number]  PLAN OF CARE: Discharge to home after PACU  PATIENT DISPOSITION:  PACU - hemodynamically stable.

## 2015-11-23 NOTE — Discharge Instructions (Addendum)

## 2015-11-23 NOTE — Anesthesia Preprocedure Evaluation (Addendum)
Anesthesia Evaluation  Patient identified by MRN, date of birth, ID band Patient awake    Reviewed: Allergy & Precautions, NPO status , Patient's Chart, lab work & pertinent test results, reviewed documented beta blocker date and time   Airway Mallampati: I  TM Distance: >3 FB Neck ROM: Full    Dental  (+) Teeth Intact   Pulmonary    breath sounds clear to auscultation       Cardiovascular hypertension, Pt. on medications and Pt. on home beta blockers + CAD, + Past MI, + CABG and + Peripheral Vascular Disease  + dysrhythmias Atrial Fibrillation  Rhythm:Irregular Rate:Abnormal     Neuro/Psych PSYCHIATRIC DISORDERS Anxiety  Neuromuscular disease    GI/Hepatic Neg liver ROS, PUD,   Endo/Other  negative endocrine ROS  Renal/GU negative Renal ROS  negative genitourinary   Musculoskeletal  (+) Arthritis ,   Abdominal   Peds negative pediatric ROS (+)  Hematology negative hematology ROS (+)   Anesthesia Other Findings   Reproductive/Obstetrics negative OB ROS                            Lab Results  Component Value Date   WBC 6.5 10/27/2015   HGB 11.5* 10/27/2015   HCT 36.5 10/27/2015   MCV 90.3 10/27/2015   PLT 190 10/27/2015   Lab Results  Component Value Date   CREATININE 0.78 10/27/2015   BUN 14 10/27/2015   NA 142 10/27/2015   K 4.0 10/27/2015   CL 102 10/27/2015   CO2 31 10/27/2015   Lab Results  Component Value Date   INR 1.31 10/18/2015   INR 1.7 09/12/2010   10/2015 EKG: atrial fibrillation, rate 90.   Anesthesia Physical Anesthesia Plan  ASA: III  Anesthesia Plan: General   Post-op Pain Management:    Induction: Intravenous  Airway Management Planned: LMA  Additional Equipment:   Intra-op Plan:   Post-operative Plan: Extubation in OR  Informed Consent: I have reviewed the patients History and Physical, chart, labs and discussed the procedure including  the risks, benefits and alternatives for the proposed anesthesia with the patient or authorized representative who has indicated his/her understanding and acceptance.   Dental advisory given  Plan Discussed with: CRNA  Anesthesia Plan Comments:        Anesthesia Quick Evaluation

## 2015-11-24 ENCOUNTER — Encounter (HOSPITAL_BASED_OUTPATIENT_CLINIC_OR_DEPARTMENT_OTHER): Payer: Self-pay | Admitting: Orthopedic Surgery

## 2015-11-29 DIAGNOSIS — K649 Unspecified hemorrhoids: Secondary | ICD-10-CM | POA: Diagnosis not present

## 2015-11-29 DIAGNOSIS — K642 Third degree hemorrhoids: Secondary | ICD-10-CM | POA: Diagnosis not present

## 2015-12-07 ENCOUNTER — Other Ambulatory Visit: Payer: Self-pay | Admitting: *Deleted

## 2015-12-07 MED ORDER — APIXABAN 2.5 MG PO TABS: 2.5000 mg | ORAL_TABLET | Freq: Two times a day (BID) | ORAL | Status: DC

## 2015-12-07 NOTE — Telephone Encounter (Signed)
Patient requested to be sent to [facility]

## 2015-12-08 ENCOUNTER — Ambulatory Visit
Admission: RE | Admit: 2015-12-08 | Discharge: 2015-12-08 | Disposition: A | Discharge status: Home / Self Care | Payer: Medicare Other | Source: Ambulatory Visit | Attending: Internal Medicine | Admitting: Internal Medicine

## 2015-12-08 DIAGNOSIS — M8580 Other specified disorders of bone density and structure, unspecified site: Secondary | ICD-10-CM

## 2015-12-08 DIAGNOSIS — M8589 Other specified disorders of bone density and structure, multiple sites: Secondary | ICD-10-CM | POA: Diagnosis not present

## 2015-12-15 ENCOUNTER — Encounter: Payer: Self-pay | Admitting: Internal Medicine

## 2015-12-15 ENCOUNTER — Non-Acute Institutional Stay: Payer: Self-pay | Admitting: Internal Medicine

## 2015-12-15 VITALS — BP 130/78 | HR 73 | Temp 97.9°F | Resp 20 | Ht 66.0 in | Wt 121.4 lb

## 2015-12-15 DIAGNOSIS — J0100 Acute maxillary sinusitis, unspecified: Secondary | ICD-10-CM | POA: Diagnosis not present

## 2015-12-15 DIAGNOSIS — M899 Disorder of bone, unspecified: Secondary | ICD-10-CM | POA: Diagnosis not present

## 2015-12-15 DIAGNOSIS — M67442 Ganglion, left hand: Secondary | ICD-10-CM

## 2015-12-15 DIAGNOSIS — M8580 Other specified disorders of bone density and structure, unspecified site: Secondary | ICD-10-CM

## 2015-12-15 MED ORDER — AMOXICILLIN-POT CLAVULANATE 875-125 MG PO TABS
1.0000 | ORAL_TABLET | Freq: Two times a day (BID) | ORAL | Status: DC
Start: 2015-12-15 — End: 2016-03-22

## 2015-12-15 MED ORDER — SACCHAROMYCES BOULARDII 250 MG PO CAPS: 250.0000 mg | ORAL_CAPSULE | Freq: Two times a day (BID) | ORAL | Status: DC

## 2015-12-15 NOTE — Progress Notes (Signed)
Patient ID: Susan Conway, female   DOB: 06-20-1929, 82 y.o.   MRN: [id number]   Location: [facility] Provider: Latravious Levitt L. Mariea Clonts, D.O., C.M.D.  Code Status: DNR Goals of Care: Advanced Directive information Does patient have an advance directive?: Yes, Type of Advance Directive: [place], Pre-existing out of facility DNR order (yellow form or pink MOST form): Yellow form placed in chart (order not valid for inpatient use), Does patient want to make changes to advanced directive?: No - Patient declined  Chief Complaint  Patient presents with  . Acute Visit    sore throat since Sunday, ? about scan that was done    HPI: Patient is a 82 y.o. female seen in the office today for an acute visit.    Raspy throat with clear drainage Sunday and needed kleenex all day Monday, coughing up yellow, green and brown mucus.  Got a lot out by [employer]. Has continued now through Wed. No sick contacts known.  citrus drops used for throat, drinking lots of OJ and resting.  Is low energy.  Just wants to rest.  Saw Bernadette yesterday for BP check.  [facility].  Bought chloraseptic spray but hasn't tried it.  Gets throat tickles and just hacks and coughs.  Has a nodule on her right neck this am.  Did take fosamax for osteoporosis at one time.  Now on ca and D for osteopenia.  Reviewed her bone density with her.   Had her left hand ganglion cyst removed by Dr. Fredna Dow and she's recovering very well.  Review of Systems:  Review of Systems  Constitutional: Negative for fever and chills.  HENT: Positive for congestion and sore throat. Negative for ear discharge and ear pain.        Hoarseness; right ear pressure at first  Eyes: Negative for blurred vision.  Respiratory: Positive for cough and sputum production. Negative for hemoptysis, shortness of breath and stridor.   Cardiovascular: Negative for chest pain and palpitations.  Gastrointestinal: Negative for nausea,  vomiting and diarrhea.    Past Medical History  Diagnosis Date  . PVC's (premature ventricular contractions)   . MI, old   . Osteoporosis   . Pain in joint, shoulder region 08/12/2012  . Other disorder of muscle, ligament, and fascia 04/08/2012  . Other abnormal blood chemistry 04/08/2012  . Degeneration of lumbar or lumbosacral intervertebral disc 01/31/2012  . Cervicalgia 01/31/2012  . Pain in thoracic spine 01/31/2012  . Pathologic fracture of vertebrae ([place]) 01/22/2012  . Myalgia and myositis, unspecified 01/17/2012  . Dizziness and giddiness 11/08/2011    vertigo  . Rash and other nonspecific skin eruption 08/02/2011  . Pain in limb 01/11/2011  . Pain in joint, upper arm 12/26/2010  . Atrial fibrillation ([place]) 09/21/2010  . Long term (current) use of anticoagulants 09/2010  . Other specified cardiac dysrhythmias(427.89) 06/27/2010  . External hemorrhoids without mention of complication 123XX123  . Varicose veins of lower extremities 08/16/2009  . Ganglion of tendon sheath 08/16/2009  . Cramp of limb 08/16/2009  . Disturbance of skin sensation 08/2009  . Lumbago 07/2009  . Meralgia paresthetica 07/2007  . Closed fracture of lumbar vertebra without mention of spinal cord injury 07/17/2005  . Unspecified essential hypertension 07/17/1997  . Coronary atherosclerosis of native coronary artery 07/17/1997  . Senile osteoporosis 07/17/1993  . Bell's palsy 07/18/1979  . Conjunctiva disorder 12/26/2010  . Varicose veins of lower extremities 08/16/2009  . External hemorrhoids without mention of  complication A999333  . Other abnormal blood chemistry 2013    hyperglycemia  . Basal cell carcinoma 05/25/2014    Multiple removed by Dr. Danella Sensing in March 2015: right neck, left neck, scalp   . Actinic keratosis 05/25/2014  . Pain in joint, ankle and foot 10/27/2013    Bilateral since 1998   . Leg cramp 10/27/2013    Most frequently the right leg.   Marland Kitchen Anxiety     Past Surgical History  Procedure  Laterality Date  . Tonsillectomy  1940's  . Coronary artery bypass graft  1998    x2; LIMA to LAD; SVG to diagonal off bypass  . Hemorrhoid surgery  08/26/2012    Dr. Brantley Stage  . Skin biopsy  01/29/14    (R) neck; (R) scalp, 2 (L) neck; shave biopsy Superficial basal cell carcinoma Dr. Danella Sensing  . Colonoscopy with propofol N/A 10/19/2015    Procedure: COLONOSCOPY WITH PROPOFOL;  Surgeon: Gatha Mayer, MD;  Location: [facility];  Service: Endoscopy;  Laterality: N/A;  . Mass excision Left 11/23/2015    Procedure: LEFT WRIST EXCISION CYST;  Surgeon: Leanora Cover, MD;  Location: [facility];  Service: Orthopedics;  Laterality: Left;    Allergies  Allergen Reactions  . Iodinated Diagnostic Agents     Severe nausea and also passed out  . Sulfamethoxazole-Trimethoprim     Rash   . Bactrim     rash  . Relafen [Nabumetone] Rash      Medication List       This list is accurate as of: 12/15/15  4:29 PM.  Always use your most recent med list.               apixaban 2.5 MG Tabs tablet  Commonly known as:  ELIQUIS  Take 1 tablet (2.5 mg total) by mouth 2 (two) times daily.     atenolol 25 MG tablet  Commonly known as:  TENORMIN  Take 1 ablet by mouth once daily To treat high blood pressure and angina.     calcium carbonate 600 MG Tabs tablet  Commonly known as:  OS-CAL  Take 600 mg by mouth 2 (two) times daily with a meal.     cholecalciferol 1000 units tablet  Commonly known as:  VITAMIN D  Take 1,000 Units by mouth daily. For Vitamin D supplement.     diclofenac sodium 1 % Gel  Commonly known as:  VOLTAREN  Apply 2 g topically 4 (four) times daily.     digoxin 0.125 MG tablet  Commonly known as:  LANOXIN  Take one tablet by mouth every other day to control heart rhythm     hydrochlorothiazide 25 MG tablet  Commonly known as:  HYDRODIURIL  Take 1 tablet (25 mg total) by mouth daily.     HYDROcodone-acetaminophen 7.5-750 MG tablet  Commonly known  as:  VICODIN ES  One every 6 hours if needed for pain     hydrocortisone-pramoxine 2.5-1 % rectal cream  Commonly known as:  ANALPRAM-HC  Place rectally 3 (three) times daily as needed for hemorrhoids or itching.     LORazepam 1 MG tablet  Commonly known as:  ATIVAN  Take 1/2 tablet (0.5mg ) by mouth at bedtime     Q-10 CO-ENZYME PO  Take 1 Dose by mouth daily.     simvastatin 20 MG tablet  Commonly known as:  ZOCOR  Take one tablet by mouth once daily for cholesterol     traMADol  50 MG tablet  Commonly known as:  ULTRAM  Take 1 tablet (50 mg total) by mouth every 6 (six) hours as needed.        Health Maintenance  Topic Date Due  . INFLUENZA VACCINE  06/06/2016  . PNA vac Low Risk Adult (2 of 2 - PPSV23) 09/21/2016  . TETANUS/TDAP  11/06/2017  . DEXA SCAN  Completed  . ZOSTAVAX  Completed    Physical Exam: Filed Vitals:   12/15/15 1609  BP: 130/78  Pulse: 73  Temp: 97.9 F (36.6 C)  TempSrc: Oral  Resp: 20  Height: 5\' 6"  (1.676 m)  Weight: 121 lb 6.4 oz (55.067 kg)  SpO2: 91%   Body mass index is 19.6 kg/(m^2). Physical Exam  Constitutional: She is oriented to person, place, and time. She appears well-developed and well-nourished. No distress.  HENT:  Head: Normocephalic and atraumatic.  Right Ear: External ear normal.  Left Ear: External ear normal.  Mouth/Throat: Oropharyngeal exudate present.  Nasal congestion and erythema  Eyes: Conjunctivae are normal.  Neck: Neck supple.  And posterior auricular chain adenopathy also  Cardiovascular: Normal rate, regular rhythm and normal heart sounds.   Pulmonary/Chest: Effort normal and breath sounds normal.  Lymphadenopathy:    She has cervical adenopathy.  Neurological: She is alert and oriented to person, place, and time.  Skin: Skin is warm and dry. There is pallor.  Psychiatric: She has a normal mood and affect.    Labs reviewed: Basic Metabolic Panel:  Recent Labs  09/14/15  10/19/15 0116  10/20/15 0410 10/27/15 2027  NA 142  < > 141 137 142  K 4.2  < > 3.4* 3.2* 4.0  CL  --   < > 109 107 102  CO2  --   < > 24 25 31   GLUCOSE  --   < > 108* 141* 105*  BUN 19  < > 12 10 14   CREATININE 0.9  < > 0.58 0.71 0.78  CALCIUM  --   < > 8.0* 7.9* 9.4  TSH 3.80  --   --   --   --   < > = values in this interval not displayed. Liver Function Tests:  Recent Labs  09/14/15 10/17/15 2138 10/27/15 2027  AST 20 22 22   ALT 11 12* 19  ALKPHOS 63 58 64  BILITOT  --  1.0 0.3  PROT  --  7.5 6.3*  ALBUMIN  --  3.9 3.1*   No results for input(s): LIPASE, AMYLASE in the last 8760 hours. No results for input(s): AMMONIA in the last 8760 hours. CBC:  Recent Labs  10/18/15 0235  10/19/15 0116 10/20/15 0410 10/27/15 2027  WBC 11.0*  --   --  7.3 6.5  NEUTROABS  --   --   --   --  3.7  HGB 11.8*  < > 11.3* 11.1* 11.5*  HCT 36.0  < > 35.1* 34.1* 36.5  MCV 89.6  --   --  88.3 90.3  PLT 137*  --   --  124* 190  < > = values in this interval not displayed. Lipid Panel:  Recent Labs  09/14/15  CHOL 134  HDL 67  LDLCALC 67  TRIG 52   Procedures since last visit: Bone density reviewed from 2/17  Assessment/Plan 1. Acute maxillary sinusitis, recurrence not specified - not improving - has had discolored sputum, fatigue, lymphadenopathy -cont conservative therapies as well with nasal spray, vicks, and now tx with augmentin and  florastor - amoxicillin-clavulanate (AUGMENTIN) 875-125 MG tablet; Take 1 tablet by mouth 2 (two) times daily.  Dispense: 20 tablet; Refill: 0 - saccharomyces boulardii (FLORASTOR) 250 MG capsule; Take 1 capsule (250 mg total) by mouth 2 (two) times daily.  Dispense: 20 capsule; Refill: 0  2. Senile osteopenia -cont ca with D and additional D as well as weightbearing exercise and healthy balanced diet -recheck bone density in 2 years (12/2017)  3. Ganglion cyst of flexor tendon sheath of finger, left S/p resection by Dr. Fredna Dow -is getting better by the  day, no problems  Labs/tests ordered:  No new Next appt:  Keep regular visit 03/22/2016   Elad Macphail L. Haim Hansson, D.O. [facility] [address], [place] [address] Cell Phone (Mon-Fri 8am-5pm):  [phone] On Call:  [phone] & follow prompts after 5pm & weekends Office Phone:  [phone] Office Fax:  [phone]

## 2016-01-03 ENCOUNTER — Other Ambulatory Visit: Payer: Self-pay | Admitting: Internal Medicine

## 2016-01-12 ENCOUNTER — Encounter: Payer: Self-pay | Admitting: Internal Medicine

## 2016-01-17 ENCOUNTER — Other Ambulatory Visit: Payer: Self-pay | Admitting: *Deleted

## 2016-01-17 MED ORDER — APIXABAN 2.5 MG PO TABS: 2.5000 mg | ORAL_TABLET | Freq: Two times a day (BID) | ORAL | Status: DC

## 2016-01-17 NOTE — Telephone Encounter (Signed)
[facility]

## 2016-03-22 ENCOUNTER — Encounter: Payer: Self-pay | Admitting: Internal Medicine

## 2016-03-22 ENCOUNTER — Non-Acute Institutional Stay: Payer: Medicare Other | Admitting: Internal Medicine

## 2016-03-22 VITALS — BP 120/68 | HR 77 | Temp 97.8°F | Ht 66.0 in | Wt 126.0 lb

## 2016-03-22 DIAGNOSIS — M7551 Bursitis of right shoulder: Secondary | ICD-10-CM

## 2016-03-22 DIAGNOSIS — M899 Disorder of bone, unspecified: Secondary | ICD-10-CM | POA: Diagnosis not present

## 2016-03-22 DIAGNOSIS — I482 Chronic atrial fibrillation, unspecified: Secondary | ICD-10-CM

## 2016-03-22 DIAGNOSIS — M8580 Other specified disorders of bone density and structure, unspecified site: Secondary | ICD-10-CM

## 2016-03-22 DIAGNOSIS — M67442 Ganglion, left hand: Secondary | ICD-10-CM | POA: Diagnosis not present

## 2016-03-22 DIAGNOSIS — I10 Essential (primary) hypertension: Secondary | ICD-10-CM | POA: Diagnosis not present

## 2016-03-22 DIAGNOSIS — I2510 Atherosclerotic heart disease of native coronary artery without angina pectoris: Secondary | ICD-10-CM | POA: Diagnosis not present

## 2016-03-22 MED ORDER — LORATADINE 10 MG PO TABS: 10.0000 mg | ORAL_TABLET | Freq: Every day | ORAL | Status: DC

## 2016-03-22 MED ORDER — LORAZEPAM 1 MG PO TABS: 0.5000 mg | ORAL_TABLET | Freq: Every evening | ORAL | Status: DC | PRN

## 2016-03-22 NOTE — Progress Notes (Signed)
Location:  [facility] Service:  Clinic (12)  Provider: Ewelina Naves L. Mariea Clonts, D.O., C.M.D.  Code Status: DNR Goals of Care:  Advanced Directives 03/22/2016  Does patient have an advance directive? Yes  Type of Paramedic of [place];Out of facility DNR (pink MOST or yellow form)  Does patient want to make changes to advanced directive? -  Copy of advanced directive(s) in chart? Yes  Pre-existing out of facility DNR order (yellow form or pink MOST form) Yellow form placed in chart (order not valid for inpatient use)     Chief Complaint  Patient presents with  . Medical Management of Chronic Issues    56mth follow-up    HPI: Patient is a 82 y.o. female seen today for medical management of chronic diseases.  She is doing well herself.  Has her concerns about her husband.  She is able to sleep at night and she knows he is better off being in SNF.    BP well controlled.  Very good at dentist also.  Left hand is healing after cyst surgery.  They recommended she rub and massage the swollen area.    Afib/CAD with old MI:  No difficulty with heart racing.  Sees Dr. Lovena Le in July. Got a list of dietary choices from a cardiology program the other night for various restaurants.  In the last couple of weeks, she has been taking a 20-30 min walk around the property most days per week.  Feels like her legs are getting stronger and less wobbly.    Left tennis elbow bothersome last week and then better.    No rectal bleeding.  Bowels are moving well.    Had right shoulder bursitis with difficulty getting her spoon to her mouth.  ED physician prescribed voltaren gel.  When she finished the tube, she quit using it.  Seems she had a dry skin/reaction to the voltaren gel.  Has dry skin area medial to bra strap.    Not taking loratadine for a long time.  She decided to take it ongoing and it is helping with her rhinitis and blowing nose.    Had  some right hip pain since Christmas time and she used the tramadol for that a little, but no difficulty lately and no tramadol used.   Co Q10 did not help with calf cramps anyway so stopped it.  She's going to try regular mustard for these.  Past Medical History  Diagnosis Date  . PVC's (premature ventricular contractions)   . MI, old   . Osteoporosis   . Pain in joint, shoulder region 08/12/2012  . Other disorder of muscle, ligament, and fascia 04/08/2012  . Other abnormal blood chemistry 04/08/2012  . Degeneration of lumbar or lumbosacral intervertebral disc 01/31/2012  . Cervicalgia 01/31/2012  . Pain in thoracic spine 01/31/2012  . Pathologic fracture of vertebrae ([place]) 01/22/2012  . Myalgia and myositis, unspecified 01/17/2012  . Dizziness and giddiness 11/08/2011    vertigo  . Rash and other nonspecific skin eruption 08/02/2011  . Pain in limb 01/11/2011  . Pain in joint, upper arm 12/26/2010  . Atrial fibrillation ([place]) 09/21/2010  . Long term (current) use of anticoagulants 09/2010  . Other specified cardiac dysrhythmias(427.89) 06/27/2010  . External hemorrhoids without mention of complication 123XX123  . Varicose veins of lower extremities 08/16/2009  . Ganglion of tendon sheath 08/16/2009  . Cramp of limb 08/16/2009  . Disturbance of skin sensation 08/2009  .  Lumbago 07/2009  . Meralgia paresthetica 07/2007  . Closed fracture of lumbar vertebra without mention of spinal cord injury 07/17/2005  . Unspecified essential hypertension 07/17/1997  . Coronary atherosclerosis of native coronary artery 07/17/1997  . Senile osteoporosis 07/17/1993  . Bell's palsy 07/18/1979  . Conjunctiva disorder 12/26/2010  . Varicose veins of lower extremities 08/16/2009  . External hemorrhoids without mention of complication A999333  . Other abnormal blood chemistry 2013    hyperglycemia  . Basal cell carcinoma 05/25/2014    Multiple removed by Dr. Danella Sensing in March 2015: right neck, left neck, scalp     . Actinic keratosis 05/25/2014  . Pain in joint, ankle and foot 10/27/2013    Bilateral since 1998   . Leg cramp 10/27/2013    Most frequently the right leg.   Marland Kitchen Anxiety     Past Surgical History  Procedure Laterality Date  . Tonsillectomy  1940's  . Coronary artery bypass graft  1998    x2; LIMA to LAD; SVG to diagonal off bypass  . Hemorrhoid surgery  08/26/2012    Dr. Brantley Stage  . Skin biopsy  01/29/14    (R) neck; (R) scalp, 2 (L) neck; shave biopsy Superficial basal cell carcinoma Dr. Danella Sensing  . Colonoscopy with propofol N/A 10/19/2015    Procedure: COLONOSCOPY WITH PROPOFOL;  Surgeon: Gatha Mayer, MD;  Location: [facility];  Service: Endoscopy;  Laterality: N/A;  . Mass excision Left 11/23/2015    Procedure: LEFT WRIST EXCISION CYST;  Surgeon: Leanora Cover, MD;  Location: [facility];  Service: Orthopedics;  Laterality: Left;    Allergies  Allergen Reactions  . Iodinated Diagnostic Agents     Severe nausea and also passed out  . Sulfamethoxazole-Trimethoprim     Rash   . Bactrim     rash  . Relafen [Nabumetone] Rash      Medication List       This list is accurate as of: 03/22/16  2:12 PM.  Always use your most recent med list.               apixaban 2.5 MG Tabs tablet  Commonly known as:  ELIQUIS  Take 1 tablet (2.5 mg total) by mouth 2 (two) times daily.     atenolol 25 MG tablet  Commonly known as:  TENORMIN  Take 1 ablet by mouth once daily To treat high blood pressure and angina.     calcium carbonate 600 MG Tabs tablet  Commonly known as:  OS-CAL  Take 600 mg by mouth 2 (two) times daily with a meal.     cholecalciferol 1000 units tablet  Commonly known as:  VITAMIN D  Take 1,000 Units by mouth daily. For Vitamin D supplement.     digoxin 0.125 MG tablet  Commonly known as:  LANOXIN  Take 0.125 mg by mouth every other day.     hydrochlorothiazide 25 MG tablet  Commonly known as:  HYDRODIURIL  Take 25 mg by mouth daily.      hydrocortisone-pramoxine 2.5-1 % rectal cream  Commonly known as:  ANALPRAM-HC  Place rectally 3 (three) times daily as needed for hemorrhoids or itching.     LORazepam 1 MG tablet  Commonly known as:  ATIVAN  TAKE 1 TABLET AT BEDTIME TO DECREASE ANXIETY AND HELP WITH SLEEP     Q-10 CO-ENZYME PO  Take 1 Dose by mouth daily.     simvastatin 20 MG tablet  Commonly known as:  ZOCOR  Take 20 mg by mouth daily.     traMADol 50 MG tablet  Commonly known as:  ULTRAM  Take 1 tablet (50 mg total) by mouth every 6 (six) hours as needed.        Review of Systems:  Review of Systems  Constitutional: Negative for fever and chills.  HENT: Positive for hearing loss. Negative for congestion.   Eyes: Negative for blurred vision.  Respiratory: Negative for cough and shortness of breath.   Cardiovascular: Negative for chest pain, palpitations and leg swelling.  Gastrointestinal: Negative for abdominal pain, constipation, blood in stool and melena.  Genitourinary: Negative for dysuria.  Musculoskeletal: Positive for joint pain. Negative for falls.  Neurological: Negative for dizziness and loss of consciousness.  Endo/Heme/Allergies: Bruises/bleeds easily.  Psychiatric/Behavioral: Negative for depression and memory loss. The patient is nervous/anxious. The patient does not have insomnia.     Health Maintenance  Topic Date Due  . INFLUENZA VACCINE  06/06/2016  . PNA vac Low Risk Adult (2 of 2 - PPSV23) 09/21/2016  . TETANUS/TDAP  11/06/2017  . DEXA SCAN  Completed  . ZOSTAVAX  Completed    Physical Exam: Filed Vitals:   03/22/16 1400  BP: 120/68  Pulse: 77  Temp: 97.8 F (36.6 C)  TempSrc: Oral  Height: 5\' 6"  (1.676 m)  Weight: 126 lb (57.153 kg)  SpO2: 95%   Body mass index is 20.35 kg/(m^2). Physical Exam  Constitutional: She is oriented to person, place, and time. She appears well-developed and well-nourished. No distress.  Cardiovascular: Intact distal pulses.    irreg irreg  Pulmonary/Chest: Effort normal and breath sounds normal. No respiratory distress.  Abdominal: Soft. Bowel sounds are normal. She exhibits no distension. There is no tenderness.  Musculoskeletal: Normal range of motion. She exhibits tenderness.  Mild tenderness and swelling over left dorsum of hand where cyst was removed  Neurological: She is alert and oriented to person, place, and time.  Skin: Skin is warm and dry.  Psychiatric: She has a normal mood and affect.    Labs reviewed: Basic Metabolic Panel:  Recent Labs  09/14/15  10/19/15 0116 10/20/15 0410 10/27/15 2027  NA 142  < > 141 137 142  K 4.2  < > 3.4* 3.2* 4.0  CL  --   < > 109 107 102  CO2  --   < > 24 25 31   GLUCOSE  --   < > 108* 141* 105*  BUN 19  < > 12 10 14   CREATININE 0.9  < > 0.58 0.71 0.78  CALCIUM  --   < > 8.0* 7.9* 9.4  TSH 3.80  --   --   --   --   < > = values in this interval not displayed. Liver Function Tests:  Recent Labs  09/14/15 10/17/15 2138 10/27/15 2027  AST 20 22 22   ALT 11 12* 19  ALKPHOS 63 58 64  BILITOT  --  1.0 0.3  PROT  --  7.5 6.3*  ALBUMIN  --  3.9 3.1*   No results for input(s): LIPASE, AMYLASE in the last 8760 hours. No results for input(s): AMMONIA in the last 8760 hours. CBC:  Recent Labs  10/18/15 0235  10/19/15 0116 10/20/15 0410 10/27/15 2027  WBC 11.0*  --   --  7.3 6.5  NEUTROABS  --   --   --   --  3.7  HGB 11.8*  < > 11.3* 11.1* 11.5*  HCT 36.0  < >  35.1* 34.1* 36.5  MCV 89.6  --   --  88.3 90.3  PLT 137*  --   --  124* 190  < > = values in this interval not displayed. Lipid Panel:  Recent Labs  09/14/15  CHOL 134  HDL 67  LDLCALC 67  TRIG 52   No results found for: HGBA1C   Assessment/Plan 1. Senile osteopenia -continues regular weightbearing exercise, caltrate and vitamin D supplement  2. Essential hypertension -bp controlled on current regimen w/o dizziness so cont same and monitor  3. Ganglion cyst of flexor tendon  sheath of finger, left -s/p excision -still with some swelling which is gradually improving with massage over time  4. Chronic atrial fibrillation (HCC) -continues on eliquis low dose due to weight and age, dig also low dose, atenolol more for bp  5. Coronary artery disease involving native coronary artery of native heart without angina pectoris -cont statin, ideally should be on alternative beta blocker besides atenolol but is followed by cardiology and stable so will leave this alone -no chest pain or shortness of breath, doing fine  6. Shoulder bursitis, right -used voltaren gel but thinks it caused a rash and dry scaly skin on that shoulder area which she still has months later around her bra strap--it did help the pain though, but she stopped after the one tube and does not need it anymore  Labs/tests ordered: cbc, cmp, flp, hba1c before next visit Next appt:  6 mos for annual with labs before  Streeter L. Horald Birky, D.O. [facility] [address], [place] [address] Cell Phone (Mon-Fri 8am-5pm):  [phone] On Call:  [phone] & follow prompts after 5pm & weekends Office Phone:  [phone] Office Fax:  [phone]

## 2016-04-06 ENCOUNTER — Other Ambulatory Visit: Payer: Self-pay | Admitting: Internal Medicine

## 2016-04-07 ENCOUNTER — Other Ambulatory Visit: Payer: Self-pay

## 2016-04-07 MED ORDER — ATENOLOL 25 MG PO TABS: ORAL_TABLET | ORAL | Status: DC

## 2016-04-07 MED ORDER — ATENOLOL 25 MG PO TABS: 25.0000 mg | ORAL_TABLET | Freq: Every day | ORAL | Status: DC

## 2016-04-07 NOTE — Telephone Encounter (Signed)
Fax received from [facility] for refill on Atenolol. Prescription was electronically to [facility].

## 2016-04-27 ENCOUNTER — Encounter: Payer: Self-pay | Admitting: *Deleted

## 2016-05-11 ENCOUNTER — Encounter: Payer: Self-pay | Admitting: Internal Medicine

## 2016-05-11 ENCOUNTER — Ambulatory Visit (INDEPENDENT_AMBULATORY_CARE_PROVIDER_SITE_OTHER): Payer: Medicare Other | Admitting: Internal Medicine

## 2016-05-11 VITALS — BP 124/82 | HR 81 | Ht 66.0 in | Wt 129.4 lb

## 2016-05-11 DIAGNOSIS — I2510 Atherosclerotic heart disease of native coronary artery without angina pectoris: Secondary | ICD-10-CM | POA: Diagnosis not present

## 2016-05-11 DIAGNOSIS — Z79899 Other long term (current) drug therapy: Secondary | ICD-10-CM

## 2016-05-11 DIAGNOSIS — I482 Chronic atrial fibrillation, unspecified: Secondary | ICD-10-CM

## 2016-05-11 NOTE — Patient Instructions (Signed)
Medication Instructions:  Your physician recommends that you continue on your current medications as directed. Please refer to the Current Medication list given to you today.  Labwork: Digoxin level  Testing/Procedures: None ordered  Follow-Up: Your physician wants you to follow-up in: 1 year with Dr. Lovena Le.  You will receive a reminder letter in the mail two months in advance. If you don't receive a letter, please call our office to schedule the follow-up appointment.  If you need a refill on your cardiac medications before your next appointment, please call your pharmacy.  Thank you for choosing [facility]!!

## 2016-05-11 NOTE — Progress Notes (Signed)
HPI Susan Conway returns today for followup. She is a very pleasant 82 year old woman with coronary artery disease status post bypass surgery, chronic atrial fibrillation, and dyslipidemia. In the interim, she has done well although her husband has been ill. She denies chest pain, shortness of breath, or peripheral edema. No syncope. She remains active walking albeit at a reduce frequency. Allergies  Allergen Reactions  . Iodinated Diagnostic Agents     Severe nausea and also passed out  . Sulfamethoxazole-Trimethoprim     Rash   . Bactrim     rash  . Relafen [Nabumetone] Rash     Current Outpatient Prescriptions  Medication Sig Dispense Refill  . apixaban (ELIQUIS) 2.5 MG TABS tablet Take 1 tablet (2.5 mg total) by mouth 2 (two) times daily. 180 tablet 3  . atenolol (TENORMIN) 25 MG tablet Take 1 tablet (25 mg total) by mouth daily. 90 tablet 3  . calcium carbonate (OS-CAL) 600 MG TABS Take 600 mg by mouth 2 (two) times daily with a meal.     . cholecalciferol (VITAMIN D) 1000 UNITS tablet Take 1,000 Units by mouth daily. For Vitamin D supplement.    . digoxin (LANOXIN) 0.125 MG tablet Take 1 tablet (0.125 mg total) by mouth every other day. 45 tablet 0  . hydrochlorothiazide (HYDRODIURIL) 25 MG tablet Take 25 mg by mouth daily.     Marland Kitchen loratadine (CLARITIN) 10 MG tablet Take 1 tablet (10 mg total) by mouth daily. 30 tablet 11  . LORazepam (ATIVAN) 1 MG tablet Take 0.5 tablets (0.5 mg total) by mouth at bedtime as needed for anxiety. 90 tablet 0  . simvastatin (ZOCOR) 20 MG tablet Take 20 mg by mouth daily.    Marland Kitchen HYDROcodone-acetaminophen (VICODIN ES) 7.5-750 MG tablet Take 1 tablet by mouth daily as needed for pain. Reported on 05/11/2016     No current facility-administered medications for this visit.     Past Medical History  Diagnosis Date  . PVC's (premature ventricular contractions)   . MI, old   . Osteoporosis   . Pain in joint, shoulder region 08/12/2012  . Other disorder of  muscle, ligament, and fascia 04/08/2012  . Other abnormal blood chemistry 04/08/2012  . Degeneration of lumbar or lumbosacral intervertebral disc 01/31/2012  . Cervicalgia 01/31/2012  . Pain in thoracic spine 01/31/2012  . Pathologic fracture of vertebrae ([place]) 01/22/2012  . Myalgia and myositis, unspecified 01/17/2012  . Dizziness and giddiness 11/08/2011    vertigo  . Rash and other nonspecific skin eruption 08/02/2011  . Pain in limb 01/11/2011  . Pain in joint, upper arm 12/26/2010  . Atrial fibrillation ([place]) 09/21/2010  . Long term (current) use of anticoagulants 09/2010  . Other specified cardiac dysrhythmias(427.89) 06/27/2010  . External hemorrhoids without mention of complication 123XX123  . Varicose veins of lower extremities 08/16/2009  . Ganglion of tendon sheath 08/16/2009  . Cramp of limb 08/16/2009  . Disturbance of skin sensation 08/2009  . Lumbago 07/2009  . Meralgia paresthetica 07/2007  . Closed fracture of lumbar vertebra without mention of spinal cord injury 07/17/2005  . Unspecified essential hypertension 07/17/1997  . Coronary atherosclerosis of native coronary artery 07/17/1997  . Senile osteoporosis 07/17/1993  . Bell's palsy 07/18/1979  . Conjunctiva disorder 12/26/2010  . Varicose veins of lower extremities 08/16/2009  . External hemorrhoids without mention of complication A999333  . Other abnormal blood chemistry 2013    hyperglycemia  . Basal cell carcinoma 05/25/2014    Multiple removed by Dr.  Danella Sensing in March 2015: right neck, left neck, scalp   . Actinic keratosis 05/25/2014  . Pain in joint, ankle and foot 10/27/2013    Bilateral since 1998   . Leg cramp 10/27/2013    Most frequently the right leg.   Marland Kitchen Anxiety     ROS:   All systems reviewed and negative except as noted in the HPI.   Past Surgical History  Procedure Laterality Date  . Tonsillectomy  1940's  . Coronary artery bypass graft  1998    x2; LIMA to LAD; SVG to diagonal off bypass  .  Hemorrhoid surgery  08/26/2012    Dr. Brantley Stage  . Skin biopsy  01/29/14    (R) neck; (R) scalp, 2 (L) neck; shave biopsy Superficial basal cell carcinoma Dr. Danella Sensing  . Colonoscopy with propofol N/A 10/19/2015    Procedure: COLONOSCOPY WITH PROPOFOL;  Surgeon: Gatha Mayer, MD;  Location: [facility];  Service: Endoscopy;  Laterality: N/A;  . Mass excision Left 11/23/2015    Procedure: LEFT WRIST EXCISION CYST;  Surgeon: Leanora Cover, MD;  Location: [facility];  Service: Orthopedics;  Laterality: Left;     Family History  Problem Relation Age of Onset  . Heart attack Father 85  . Heart disease Father   . Hypertension Sister   . Heart disease Sister   . Heart disease Brother   .        Social History   Social History  . Marital Status: Married    Spouse Name: N/A  . Number of Children: N/A  . Years of Education: N/A   Occupational History  . Retired Pharmacist, hospital    Social History Main Topics  . Smoking status: Never Smoker   . Smokeless tobacco: Never Used  . Alcohol Use: Yes     Comment: occasional wine  . Drug Use: No  . Sexual Activity: No   Other Topics Concern  . Not on file   Social History Narrative   Living at [facility] since 2010   Married - Charles   Never smoked   Alcohol occasional wine   Exercise, house work    DNR , POA, Living Will     BP 124/82 mmHg  Pulse 81  Ht 5\' 6"  (1.676 m)  Wt 129 lb 6.4 oz (58.695 kg)  BMI 20.90 kg/m2  Physical Exam:  Well appearing elderly woman, NAD HEENT: Unremarkable Neck:  6 cm JVD, no thyromegally Lungs:  Clear with no wheezes, rales, or rhonchi. HEART:  IRegular rate rhythm, no murmurs, no rubs, no clicks Abd:  soft, positive bowel sounds, no organomegally, no rebound, no guarding Ext:  2 plus pulses, no edema, no cyanosis, no clubbing Skin:  No rashes no nodules Neuro:  CN II through XII intact, motor grossly intact  EKG - atrial fibrillation with a controlled ventricular  response.   Assess/Plan: 1. Atrial fib - her ventricular rate is well controlled. She will continue her current meds 2. CAD - she is s/p remote  CABG. She denies anginal symptoms. She is encouraged to maintain regular daily exercise.  3. HTN - her blood pressure is well controlled. She will continue her current meds.  Mikle Bosworth.D.

## 2016-05-12 LAB — DIGOXIN LEVEL

## 2016-05-29 ENCOUNTER — Emergency Department (HOSPITAL_COMMUNITY)
Admission: EM | Admit: 2016-05-29 | Discharge: 2016-05-29 | Disposition: A | Discharge status: Home / Self Care | Payer: Medicare Other | Attending: Emergency Medicine | Admitting: Emergency Medicine

## 2016-05-29 ENCOUNTER — Telehealth: Payer: Self-pay | Admitting: Cardiology

## 2016-05-29 ENCOUNTER — Encounter (HOSPITAL_COMMUNITY): Payer: Self-pay | Admitting: Emergency Medicine

## 2016-05-29 DIAGNOSIS — I4891 Unspecified atrial fibrillation: Secondary | ICD-10-CM | POA: Insufficient documentation

## 2016-05-29 DIAGNOSIS — I2510 Atherosclerotic heart disease of native coronary artery without angina pectoris: Secondary | ICD-10-CM | POA: Insufficient documentation

## 2016-05-29 DIAGNOSIS — I10 Essential (primary) hypertension: Secondary | ICD-10-CM | POA: Insufficient documentation

## 2016-05-29 DIAGNOSIS — R030 Elevated blood-pressure reading, without diagnosis of hypertension: Secondary | ICD-10-CM | POA: Diagnosis not present

## 2016-05-29 DIAGNOSIS — R739 Hyperglycemia, unspecified: Secondary | ICD-10-CM | POA: Insufficient documentation

## 2016-05-29 DIAGNOSIS — Z85828 Personal history of other malignant neoplasm of skin: Secondary | ICD-10-CM | POA: Insufficient documentation

## 2016-05-29 DIAGNOSIS — Z79899 Other long term (current) drug therapy: Secondary | ICD-10-CM | POA: Insufficient documentation

## 2016-05-29 DIAGNOSIS — R040 Epistaxis: Secondary | ICD-10-CM | POA: Insufficient documentation

## 2016-05-29 DIAGNOSIS — I252 Old myocardial infarction: Secondary | ICD-10-CM | POA: Insufficient documentation

## 2016-05-29 MED ORDER — OXYMETAZOLINE HCL 0.05 % NA SOLN
1.0000 | Freq: Once | NASAL | Status: AC
  Administered 2016-05-29: 1 via NASAL
  Filled 2016-05-29: qty 15

## 2016-05-29 MED ORDER — SILVER NITRATE-POT NITRATE 75-25 % EX MISC
2.0000 | Freq: Once | CUTANEOUS | Status: AC
  Administered 2016-05-29: 2 via TOPICAL
  Filled 2016-05-29: qty 2

## 2016-05-29 NOTE — ED Provider Notes (Signed)
[facility] DEPT Provider Note   CSN: [id number] Arrival date & time: 05/29/16  1424  First Provider Contact:  First MD Initiated Contact with Patient 05/29/16 1430        History   Chief Complaint Chief Complaint  Patient presents with  . Epistaxis    HPI Susan Conway is a 82 y.o. female.  HPI Complains of bleeding from left nostril onset 1:30 PM today. Denies other complaint denies lightheadedness. No other associated symptoms. Treated with Afrin by EMS. Past Medical History:  Diagnosis Date  . Actinic keratosis 05/25/2014  . Anxiety   . Atrial fibrillation ([place]) 09/21/2010  . Basal cell carcinoma 05/25/2014   Multiple removed by Dr. Danella Sensing in March 2015: right neck, left neck, scalp   . Bell's palsy 07/18/1979  . Cervicalgia 01/31/2012  . Closed fracture of lumbar vertebra without mention of spinal cord injury 07/17/2005  . Conjunctiva disorder 12/26/2010  . Coronary atherosclerosis of native coronary artery 07/17/1997  . Cramp of limb 08/16/2009  . Degeneration of lumbar or lumbosacral intervertebral disc 01/31/2012  . Disturbance of skin sensation 08/2009  . Dizziness and giddiness 11/08/2011   vertigo  . External hemorrhoids without mention of complication 123XX123  . External hemorrhoids without mention of complication A999333  . Ganglion of tendon sheath 08/16/2009  . Leg cramp 10/27/2013   Most frequently the right leg.   . Long term (current) use of anticoagulants 09/2010  . Lumbago 07/2009  . Meralgia paresthetica 07/2007  . MI, old   . Myalgia and myositis, unspecified 01/17/2012  . Osteoporosis   . Other abnormal blood chemistry 04/08/2012  . Other abnormal blood chemistry 2013   hyperglycemia  . Other disorder of muscle, ligament, and fascia 04/08/2012  . Other specified cardiac dysrhythmias(427.89) 06/27/2010  . Pain in joint, ankle and foot 10/27/2013   Bilateral since 1998   . Pain in joint, shoulder region 08/12/2012  . Pain in joint, upper arm  12/26/2010  . Pain in limb 01/11/2011  . Pain in thoracic spine 01/31/2012  . Pathologic fracture of vertebrae ([place]) 01/22/2012  . PVC's (premature ventricular contractions)   . Rash and other nonspecific skin eruption 08/02/2011  . Senile osteoporosis 07/17/1993  . Unspecified essential hypertension 07/17/1997  . Varicose veins of lower extremities 08/16/2009  . Varicose veins of lower extremities 08/16/2009    Patient Active Problem List   Diagnosis Date Noted  . Acute maxillary sinusitis 12/15/2015  . Senile osteopenia 12/15/2015  . Lower GI bleed   . Internal bleeding hemorrhoids   . Rectal ulcer   . Constipation   . Rectal bleeding 10/17/2015  . Actinic keratosis 05/25/2014  . Basal cell carcinoma 05/25/2014  . Leg cramp 10/27/2013  . Tennis elbow 10/27/2013  . Pain in joint, ankle and foot 10/27/2013  . Pain in joint, shoulder region 06/21/2013  . Cervicalgia 06/21/2013  . Hyperglycemia 04/28/2013  . Hemorrhoids 09/16/2012  . CORONARY ATHEROSCLEROSIS NATIVE CORONARY ARTERY 09/12/2010  . Atrial fibrillation ([place]) 09/07/2010  . Long term (current) use of anticoagulants 09/06/2010  . Hyperlipidemia 03/17/2010  . OLD MYOCARDIAL INFARCTION 03/16/2010  . PREMATURE VENTRICULAR CONTRACTIONS 03/16/2010  . Meralgia paresthetica 07/08/2007  . Essential hypertension 07/17/1997    Past Surgical History:  Procedure Laterality Date  . COLONOSCOPY WITH PROPOFOL N/A 10/19/2015   Procedure: COLONOSCOPY WITH PROPOFOL;  Surgeon: Gatha Mayer, MD;  Location: [facility];  Service: Endoscopy;  Laterality: N/A;  . CORONARY ARTERY BYPASS GRAFT  1998   x2;  LIMA to LAD; SVG to diagonal off bypass  . HEMORRHOID SURGERY  08/26/2012   Dr. Brantley Stage  . MASS EXCISION Left 11/23/2015   Procedure: LEFT WRIST EXCISION CYST;  Surgeon: Leanora Cover, MD;  Location: [facility];  Service: Orthopedics;  Laterality: Left;  . SKIN BIOPSY  01/29/14   (R) neck; (R) scalp, 2 (L) neck; shave biopsy  Superficial basal cell carcinoma Dr. Danella Sensing  . TONSILLECTOMY  1940's    OB History    No data available       Home Medications    Prior to Admission medications   Medication Sig Start Date End Date Taking? Authorizing Provider  apixaban (ELIQUIS) 2.5 MG TABS tablet Take 1 tablet (2.5 mg total) by mouth 2 (two) times daily. 01/17/16   Tiffany L Reed, DO  atenolol (TENORMIN) 25 MG tablet Take 1 tablet (25 mg total) by mouth daily. 04/07/16   Tiffany L Reed, DO  calcium carbonate (OS-CAL) 600 MG TABS Take 600 mg by mouth 2 (two) times daily with a meal.     Historical Provider, MD  cholecalciferol (VITAMIN D) 1000 UNITS tablet Take 1,000 Units by mouth daily. For Vitamin D supplement.    Historical Provider, MD  digoxin (LANOXIN) 0.125 MG tablet Take 1 tablet (0.125 mg total) by mouth every other day. 04/06/16   [name], MD  hydrochlorothiazide (HYDRODIURIL) 25 MG tablet Take 25 mg by mouth daily.  03/07/16   Historical Provider, MD  HYDROcodone-acetaminophen (VICODIN ES) 7.5-750 MG tablet Take 1 tablet by mouth daily as needed for pain. Reported on 05/11/2016    Historical Provider, MD  loratadine (CLARITIN) 10 MG tablet Take 1 tablet (10 mg total) by mouth daily. 03/22/16   Tiffany L Reed, DO  LORazepam (ATIVAN) 1 MG tablet Take 0.5 tablets (0.5 mg total) by mouth at bedtime as needed for anxiety. 03/22/16   Tiffany L Reed, DO  simvastatin (ZOCOR) 20 MG tablet Take 20 mg by mouth daily.    Historical Provider, MD    Family History Family History  Problem Relation Age of Onset  . Heart attack Father 28  . Heart disease Father   . Hypertension Sister   . Heart disease Sister   . Heart disease Brother   .       Social History Social History  Substance Use Topics  . Smoking status: Never Smoker  . Smokeless tobacco: Never Used  . Alcohol use Yes     Comment: occasional wine     Allergies   Iodinated diagnostic agents; Sulfamethoxazole-trimethoprim; Bactrim; and Relafen  [nabumetone]   Review of Systems Review of Systems  Constitutional: Negative.   HENT: Positive for nosebleeds.   Respiratory: Negative.   Cardiovascular: Negative.   Gastrointestinal: Negative.   Musculoskeletal: Negative.   Skin: Negative.   Neurological: Negative.   Hematological: Bruises/bleeds easily.  Psychiatric/Behavioral: Negative.   All other systems reviewed and are negative.    Physical Exam Updated Vital Signs BP (!) 176/122 (BP Location: Left Arm)   Pulse 102   Temp 98 F (36.7 C) (Oral)   Resp 16   SpO2 97%   Physical Exam  Constitutional: She appears well-developed and well-nourished. She appears distressed.  HENT:  Head: Normocephalic and atraumatic.  Active bleeding from left nostril  Eyes: Conjunctivae are normal. Pupils are equal, round, and reactive to light.  Neck: Neck supple. No tracheal deviation present. No thyromegaly present.  Cardiovascular: Normal rate.   No murmur heard. Irregularly  irregular  Pulmonary/Chest: Effort normal and breath sounds normal.  Abdominal: Soft. Bowel sounds are normal. She exhibits no distension. There is no tenderness.  Musculoskeletal: Normal range of motion. She exhibits no edema or tenderness.  Neurological: She is alert. Coordination normal.  Skin: Skin is warm and dry. No rash noted.  Psychiatric: She has a normal mood and affect.  Nursing note and vitals reviewed.    ED Treatments / Results  Labs (all labs ordered are listed, but only abnormal results are displayed) Labs Reviewed - No data to display  EKG  EKG Interpretation None       Radiology No results found.  Procedures .Epistaxis Management Date/Time: 05/29/2016 3:35 PM Performed by: Orlie Dakin Authorized by: Orlie Dakin   Consent:    Consent obtained:  Verbal   Consent given by:  Patient   Risks discussed:  Bleeding and pain   Alternatives discussed:  No treatment and delayed treatment Anesthesia (see MAR for exact  dosages):    Anesthesia method:  None Procedure details:    Treatment site:  L anterior   Treatment method:  Anterior pack and silver nitrate Post-procedure details:    Assessment:  Bleeding stopped Comments:     Silver nitrate slowed bleeding however it was decided to pack4.5 cm rapid Rhino as patient is on blood thinners   (including critical care time)  Medications Ordered in ED Medications  silver nitrate applicators applicator 2 Stick (not administered)  oxymetazoline (AFRIN) 0.05 % nasal spray 1 spray (1 spray Each Nare Given 05/29/16 1507)   6:40 PM no further bleeding. Patient ambulatory without bleeding. An asymptomatic.  Initial Impression / Assessment and Plan / ED Course  I have reviewed the triage vital signs and the nursing notes.  Pertinent labs & imaging results that were available during my care of the patient were reviewed by me and considered in my medical decision making (see chart for details).  Clinical Course   Plan saline nasal spray. Nasal packing removed 3-4 days. Blood pressure recheck 1 week   Final Clinical Impressions(s) / ED Diagnoses   Final diagnoses:  None  Diagnosis #1 epistaxis #2 elevated blood pressure  New Prescriptions New Prescriptions   No medications on file     Orlie Dakin, MD 05/29/16 [phone]

## 2016-05-29 NOTE — Discharge Instructions (Signed)
Packing should be pulled all of your nose and 3 days. Call Dr. Mariea Clonts to arrange to have it done or you can go to an urgent care center. Get your blood pressure rechecked within the next 1 week. Today's was elevated at 174/90. Get saline nasal spray and spray1 squirt in into each nostril every 2 hours while awake in order to prevent nose drying and rebleeding.

## 2016-05-29 NOTE — ED Notes (Signed)
Bed: [facility] Expected date:  Expected time:  Means of arrival:  Comments: EMS nose bleed

## 2016-05-29 NOTE — ED Triage Notes (Signed)
Per [facility]: p/t transported from [facility] Per p/t she was doing laundry, blew her nose and noticed left nostril began bleeding. No hx of nosebleeds. Upon arrival BP 188/120, during transport BP came down and  2 sprays afrin was administered in L. Nostril. In transport to [facility] ED p/t coughed up a few quarter size blood clots. P/t is on Eliquis. Bleeding has slowed but continues.

## 2016-05-29 NOTE — ED Notes (Signed)
Pt concerned that daughter will drive [place] from the beach when she hears that she [the pt] was in the emergency room. Pt continues to request order from physician to stay overnight in [facility] so that daughter thinks she [pt] is safe and does not try to drive home. Jacubowitz at bedside verbalizes that daughter driving home is not indication for pt to be written order to stay in rehab. Pt ambulates independently and is able to care for self. Pt has cell phone and safety cord if needs help from home.

## 2016-05-29 NOTE — Telephone Encounter (Signed)
Pt called about her ER visit today for nose bleed, silver nitrate was used and bleeding stopped per note. She is at home now and has minimal bleeding.  She is to keep packing in until Thursday.  She will hold Eliquis tonight and resume in AM.  If more bleeding to call the office.

## 2016-05-31 ENCOUNTER — Telehealth: Payer: Self-pay | Admitting: Internal Medicine

## 2016-05-31 ENCOUNTER — Telehealth: Payer: Self-pay | Admitting: *Deleted

## 2016-05-31 NOTE — Telephone Encounter (Signed)
New Message  Pt call requesting to speak with RN. Pt states she went to hospital for severe nose blood and now has questions about her medication and what correct dosages she should be taking for each. Please call back to discuss

## 2016-05-31 NOTE — Telephone Encounter (Signed)
Returned call to patient and let her know to use saline nasal spray to keep nostrils moist and this should help  She gets packing out tomorrow and will call back if further problems

## 2016-05-31 NOTE — Telephone Encounter (Signed)
Spoke with susan and advised results

## 2016-05-31 NOTE — Telephone Encounter (Signed)
Pt went to the ER on Monday for nosebleeds, Susan Conway is asking if Adonis Brook can take the packing out of her nose on Thursday or Friday? She doesn't know if that's something she does?

## 2016-05-31 NOTE — Telephone Encounter (Signed)
I would think she could, yes.

## 2016-06-02 ENCOUNTER — Encounter: Payer: Self-pay | Admitting: Adult Health

## 2016-06-02 ENCOUNTER — Non-Acute Institutional Stay: Payer: Medicare Other | Admitting: Adult Health

## 2016-06-02 DIAGNOSIS — I10 Essential (primary) hypertension: Secondary | ICD-10-CM

## 2016-06-02 DIAGNOSIS — I2510 Atherosclerotic heart disease of native coronary artery without angina pectoris: Secondary | ICD-10-CM

## 2016-06-02 DIAGNOSIS — R040 Epistaxis: Secondary | ICD-10-CM

## 2016-06-02 NOTE — Progress Notes (Signed)
Patient ID: Susan Conway, female   DOB: 08-25-1929, 82 y.o.   MRN: [id number]   Location:   [facility]   Place of Service:  Clinic (12) Provider:   Cindi Carbon, ANP [place] [phone]   Hollace Kinnier, DO  Patient Care Team: [name], DO as PCP - General (Geriatric Medicine) [name], MD as Consulting Physician (Dermatology) [name], MD as Consulting Physician (Cardiology) [facility] Danella Sensing, MD as Consulting Physician (Dermatology)  Extended Emergency Contact Information Primary Emergency Contact: [name] of [place] Phone: [phone] Mobile Phone: [phone] Relation: Daughter Secondary Emergency Contact: [name],[name]  [name] of [place] Phone: [phone] Relation: Son  Code Status:  Goals of care: Advanced Directive information Advanced Directives 05/29/2016  Does patient have an advance directive? Yes  Type of Paramedic of [place];Living will  Does patient want to make changes to advanced directive? -  Copy of advanced directive(s) in chart? -  Pre-existing out of facility DNR order (yellow form or pink MOST form) -     Chief Complaint  Patient presents with  . Acute Visit    f/u rapid rhino removal    HPI:  Pt is a 82 y.o. female seen today for an acute visit for removal of rapid rhino device placed after an episode of epistaxis on Monday in the ER.  Resident denies any previous issues regarding this and has had not recent URI or allergy symptoms. Currently she is on eliquis for CVA risk reduction due to afib.   Past Medical History:  Diagnosis Date  . Actinic keratosis 05/25/2014  . Anxiety   . Atrial fibrillation ([place]) 09/21/2010  . Basal cell carcinoma 05/25/2014   Multiple removed by Dr. Danella Sensing in March 2015: right neck, left neck, scalp   . Bell's palsy 07/18/1979  . Cervicalgia 01/31/2012  . Closed fracture of lumbar  vertebra without mention of spinal cord injury 07/17/2005  . Conjunctiva disorder 12/26/2010  . Coronary atherosclerosis of native coronary artery 07/17/1997  . Cramp of limb 08/16/2009  . Degeneration of lumbar or lumbosacral intervertebral disc 01/31/2012  . Disturbance of skin sensation 08/2009  . Dizziness and giddiness 11/08/2011   vertigo  . External hemorrhoids without mention of complication 123XX123  . External hemorrhoids without mention of complication [id number]  . Ganglion of tendon sheath 08/16/2009  . Leg cramp 10/27/2013   Most frequently the right leg.   . Long term (current) use of anticoagulants 09/2010  . Lumbago 07/2009  . Meralgia paresthetica 07/2007  . MI, old   . Myalgia and myositis, unspecified 01/17/2012  . Osteoporosis   . Other abnormal blood chemistry 04/08/2012  . Other abnormal blood chemistry 2013   hyperglycemia  . Other disorder of muscle, ligament, and fascia 04/08/2012  . Other specified cardiac dysrhythmias(427.89) 06/27/2010  . Pain in joint, ankle and foot 10/27/2013   Bilateral since 1998   . Pain in joint, shoulder region 08/12/2012  . Pain in joint, upper arm 12/26/2010  . Pain in limb 01/11/2011  . Pain in thoracic spine 01/31/2012  . 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 PVC's (premature ventricular contractions)   . Rash and other nonspecific skin eruption 08/02/2011  . Senile osteoporosis 07/17/1993  . Unspecified essential hypertension 07/17/1997  . Varicose veins of lower extremities 08/16/2009  . Varicose veins of lower extremities 08/16/2009   Past Surgical History:  Procedure Laterality Date  . COLONOSCOPY WITH  PROPOFOL N/A 10/19/2015   Procedure: COLONOSCOPY WITH PROPOFOL;  Surgeon: Gatha Mayer, MD;  Location: [facility];  Service: Endoscopy;  Laterality: N/A;  . CORONARY ARTERY BYPASS GRAFT  1998   x2; LIMA to LAD; SVG to diagonal off bypass  . HEMORRHOID SURGERY  08/26/2012   Dr. Brantley Stage  . MASS EXCISION Left  11/23/2015   Procedure: LEFT WRIST EXCISION CYST;  Surgeon: Leanora Cover, MD;  Location: [facility];  Service: Orthopedics;  Laterality: Left;  . SKIN BIOPSY  01/29/14   (R) neck; (R) scalp, 2 (L) neck; shave biopsy Superficial basal cell carcinoma Dr. Danella Sensing  . TONSILLECTOMY  1940's    Allergies  Allergen Reactions  . Iodinated Diagnostic Agents     Severe nausea and also passed out  . Sulfamethoxazole-Trimethoprim     Rash   . Bactrim     rash  . Relafen [Nabumetone] Rash      Medication List       Accurate as of 06/02/16 12:26 PM. Always use your most recent med list.          apixaban 2.5 MG Tabs tablet Commonly known as:  ELIQUIS Take 1 tablet (2.5 mg total) by mouth 2 (two) times daily.   atenolol 25 MG tablet Commonly known as:  TENORMIN Take 1 tablet (25 mg total) by mouth daily.   calcium carbonate 600 MG Tabs tablet Commonly known as:  OS-CAL Take 600 mg by mouth 2 (two) times daily with a meal.   cholecalciferol 1000 units tablet Commonly known as:  VITAMIN D Take 1,000 Units by mouth daily. For Vitamin D supplement.   digoxin 0.125 MG tablet Commonly known as:  LANOXIN Take 1 tablet (0.125 mg total) by mouth every other day.   hydrochlorothiazide 25 MG tablet Commonly known as:  HYDRODIURIL Take 25 mg by mouth daily.   HYDROcodone-acetaminophen 7.5-325 MG tablet Commonly known as:  NORCO Take 1 tablet by mouth every 6 (six) hours as needed for moderate pain or severe pain.   loratadine 10 MG tablet Commonly known as:  CLARITIN Take 1 tablet (10 mg total) by mouth daily.   LORazepam 1 MG tablet Commonly known as:  ATIVAN Take 0.5 tablets (0.5 mg total) by mouth at bedtime as needed for anxiety.   simvastatin 20 MG tablet Commonly known as:  ZOCOR Take 20 mg by mouth daily.   traMADol 50 MG tablet Commonly known as:  ULTRAM Take 50 mg by mouth every 6 (six) hours as needed for moderate pain.       Review of Systems    HENT: Positive for nosebleeds. Negative for congestion, postnasal drip, rhinorrhea and sore throat.   Respiratory: Negative for cough and shortness of breath.   Cardiovascular: Negative for chest pain.    Immunization History  Administered Date(s) Administered  . Influenza Whole 08/06/2012  . Influenza-Unspecified 08/06/2013, 08/24/2014, 08/26/2015  . Pneumococcal Conjugate-13 11/07/2007, 09/22/2015  . Td 11/07/2007  . Zoster 03/12/2008   Pertinent  Health Maintenance Due  Topic Date Due  . INFLUENZA VACCINE  06/06/2016  . PNA vac Low Risk Adult (2 of 2 - PPSV23) 09/21/2016  . DEXA SCAN  Completed   Fall Risk  03/22/2016 09/22/2015 03/16/2015 05/25/2014 04/28/2013  Falls in the past year? No Yes No No No  Number falls in past yr: - 1 - - -  Injury with Fall? - No - - -   Functional Status Survey:    Vitals:  06/02/16 1225  BP: 140/80   There is no height or weight on file to calculate BMI. Physical Exam  Constitutional: No distress.  HENT:  Head: Normocephalic and atraumatic.  Mouth/Throat: Oropharynx is clear and moist. No oropharyngeal exudate.  Left nare with rapid rhino in place. Once removed small nasal ulceration noted to left nare.  Normal right nare.  Neck: Normal range of motion. Neck supple. No JVD present.  Lymphadenopathy:    She has no cervical adenopathy.  Skin: She is not diaphoretic.    Labs reviewed:  Recent Labs  10/19/15 0116 10/20/15 0410 10/27/15 2027  NA 141 137 142  K 3.4* 3.2* 4.0  CL 109 107 102  CO2 24 25 31   GLUCOSE 108* 141* 105*  BUN 12 10 14   CREATININE 0.58 0.71 0.78  CALCIUM 8.0* 7.9* 9.4    Recent Labs  09/14/15 10/17/15 2138 10/27/15 2027  AST 20 22 22   ALT 11 12* 19  ALKPHOS 63 58 64  BILITOT  --  1.0 0.3  PROT  --  7.5 6.3*  ALBUMIN  --  3.9 3.1*    Recent Labs  10/18/15 0235  10/19/15 0116 10/20/15 0410 10/27/15 2027  WBC 11.0*  --   --  7.3 6.5  NEUTROABS  --   --   --   --  3.7  HGB 11.8*  < > 11.3*  11.1* 11.5*  HCT 36.0  < > 35.1* 34.1* 36.5  MCV 89.6  --   --  88.3 90.3  PLT 137*  --   --  124* 190  < > = values in this interval not displayed. Lab Results  Component Value Date   TSH 3.80 09/14/2015   No results found for: HGBA1C Lab Results  Component Value Date   CHOL 134 09/14/2015   HDL 67 09/14/2015   LDLCALC 67 09/14/2015   TRIG 52 09/14/2015   CHOLHDL 2 09/12/2010    Significant Diagnostic Results in last 30 days:  No results found.  Assessment/Plan 1. Epistaxis Successful removal of rapid rhino with out immediate re-bleeding Bactroban to bilat nares BID for 7 days Then ocean spray 2 sprays each nare BID Avoid picking or blowing nose  2. HTN Slight elevation in BP  Return to clinic RN for BP check next week    Family/ staff Communication: discussed with resident  Labs/tests ordered:  NA

## 2016-06-05 DIAGNOSIS — Z85828 Personal history of other malignant neoplasm of skin: Secondary | ICD-10-CM | POA: Diagnosis not present

## 2016-06-05 DIAGNOSIS — L821 Other seborrheic keratosis: Secondary | ICD-10-CM | POA: Diagnosis not present

## 2016-06-05 DIAGNOSIS — L570 Actinic keratosis: Secondary | ICD-10-CM | POA: Diagnosis not present

## 2016-06-05 DIAGNOSIS — L309 Dermatitis, unspecified: Secondary | ICD-10-CM | POA: Diagnosis not present

## 2016-06-05 DIAGNOSIS — D485 Neoplasm of uncertain behavior of skin: Secondary | ICD-10-CM | POA: Diagnosis not present

## 2016-06-08 ENCOUNTER — Telehealth: Payer: Self-pay | Admitting: *Deleted

## 2016-06-08 ENCOUNTER — Telehealth: Payer: Self-pay | Admitting: Internal Medicine

## 2016-06-08 NOTE — Telephone Encounter (Signed)
Call from Titus Dubin seen patient to remove packing, and per Adonis Brook pt was to start saline spray after pt finished Bactrim. Recently pt put finger in her nose and the bleeding started back and they ([facility]) packed it again. Manuela Schwartz removed packing today and there is a lesion in her nose, per susan can they continue with bactrim ( due to be stopped tomorrow) or should she start the saline spray?? Please advise

## 2016-06-08 NOTE — Telephone Encounter (Signed)
Spoke with susan along with Dr. Mariea Clonts, ok to continue bactrim for another week.

## 2016-06-08 NOTE — Telephone Encounter (Signed)
New message   Pt stated that she has a nose bleed on 7/24 and then this morning and would like to know should she reduce her Eliquis dosage? She ended up in the ER on July 24. What should she do? Please call.

## 2016-06-08 NOTE — Telephone Encounter (Addendum)
Per NP:  Assessment/Plan 1. Epistaxis Successful removal of rapid rhino with out immediate re-bleeding Bactroban to bilat nares BID for 7 days Then ocean spray 2 sprays each nare BID Avoid picking or blowing nose   I have tried calling the patient back but did not get an answer or VM. She should continue her medications as prescribed and do as recommended by NP.  Hopefully this will help keep nares moisturized and prevent future nose bleeds.    If she continues to have nose bleeds call back for recomendations

## 2016-06-23 ENCOUNTER — Telehealth: Payer: Self-pay | Admitting: Internal Medicine

## 2016-06-23 NOTE — Telephone Encounter (Signed)
New message     Pt calling stating that she has had nose bleeds since July 24th and is questioning how much Eliquis she should be taking. Please call.

## 2016-06-23 NOTE — Telephone Encounter (Signed)
SPOKE WITH PT ,  THE  FIRST EPISODE ( NOSE BLEED )  PT   HAD TO GO  TO, ER . SEE  NOTES,  NEEDED PACKED  AND  CAUTERIZED HAD  OTHER  EPISODES  WITH THIS  EPISODE  LASTING  FOR  1 HOUR. PT   CURRENTLY TAKING  ELIQUIS 2.5 MG  BID    WAS  WANTING  TO KNOW  IF  CAN TAKE  LESS  . PT  AWARE   THIS IS LOW  DOSE .  PT  STATED  HAD  TAKEN  PRADAXA  IN PAST  BUT  THINKS  INSURANCE  WOULD NOT  COVER  ALSO WAS  ON COUMADIN  AND  DID NOT  HAVE NOSE  BLEEDS . INSTRUCTED PT  TO CONTINUE  TO TAKE    AND IF   HAS  BLEEDING  OVER  WEEKEND  GO TO ER    PT  UNDERSTANDS  THE IMPORTANCE OF  THERAPY  FOR  STROKE  AND HEART ATTACK  PREVENTION  WILL FORWARD  TO  DR Lovena Le  FOR  RECOMMENDATIONS .Adonis Housekeeper

## 2016-06-24 ENCOUNTER — Other Ambulatory Visit: Payer: Self-pay | Admitting: Internal Medicine

## 2016-06-24 DIAGNOSIS — I4891 Unspecified atrial fibrillation: Secondary | ICD-10-CM

## 2016-06-26 ENCOUNTER — Encounter: Payer: Self-pay | Admitting: Internal Medicine

## 2016-06-26 NOTE — Telephone Encounter (Signed)
New Message  Pt c/o medication issue:  1. Name of Medication: Eliquis   2. How are you currently taking this medication (dosage and times per day)? 2.5mg   3. Are you having a reaction (difficulty breathing--STAT)? Pt states she has been experiencing nose bleeds  4. What is your medication issue? Pt call requesting to speak with RN about her nose bleeds. Pt states she this the 4th nose bleed since July 24th. Pt call to ask if she needs to go head and make an appt with ENT. Please call back to discuss

## 2016-06-26 NOTE — Telephone Encounter (Signed)
Call Documentation   Tamera Reason at 06/26/2016 9:54 AM   Status: Signed    New Message  Pt c/o medication issue:  1. Name of Medication: Eliquis   2. How are you currently taking this medication (dosage and times per day)? 2.5mg   3. Are you having a reaction (difficulty breathing--STAT)? Pt states she has been experiencing nose bleeds  4. What is your medication issue? Pt call requesting to speak with RN about her nose bleeds. Pt states she this the 4th nose bleed since July 24th. Pt call to ask if she needs to go head and make an appt with ENT. Please call back to discuss

## 2016-06-26 NOTE — Telephone Encounter (Signed)
This encounter was created in error - please disregard.

## 2016-06-26 NOTE — Telephone Encounter (Signed)
Had another nose bleed last night.  The nurse from [facility] came and it took an hour for it to stop.  This is the 4 one she has had including the one on 7/28.  She is using her saline nasal spray twice daily.  Just want to know if Dr Lovena Le feels she should see an ENT and if she should try another medication instead of Eliquis.  I let her know I would discuss with Dr Lovena Le and call her back.  She was very appreciative of my call

## 2016-06-28 NOTE — Telephone Encounter (Signed)
Discussed with Dr Lovena Le and he would like for her to see an ENT.  I have left her a message with these recommendations

## 2016-06-29 ENCOUNTER — Telehealth: Payer: Self-pay | Admitting: *Deleted

## 2016-06-29 DIAGNOSIS — R040 Epistaxis: Secondary | ICD-10-CM

## 2016-06-29 NOTE — Telephone Encounter (Signed)
Spoke with bernadette and advised results, she will call patient and let her know dorothy will be calling her

## 2016-06-29 NOTE — Telephone Encounter (Signed)
Tiffany L Reed, DO  You 1 minute ago (2:20 PM)    Order for ENT was placed. Please notify Mliss Sax. (Routing comment)     You routed conversation to [employer], DO 2 hours ago (11:55 AM)    You 2 hours ago (11:55 AM)

## 2016-06-29 NOTE — Telephone Encounter (Signed)
Mliss Sax calling asking if patient can get referral to ENT per cardiology for recurrent nose bleeds

## 2016-06-30 NOTE — Telephone Encounter (Signed)
F/U  Patient is returning your call      

## 2016-07-03 NOTE — Telephone Encounter (Signed)
Returned call to patient and left message for her with recomendations

## 2016-07-04 ENCOUNTER — Encounter: Payer: Self-pay | Admitting: Internal Medicine

## 2016-07-04 NOTE — Telephone Encounter (Signed)
Spoke with patient and she is waiting for [facility] MD to make a referral to ENT through [facility].  If they can not get her seen quickly she will call me back and I will refer to Dr Erik Obey.  She has not had any more nose bleeds and will call back if needed

## 2016-07-04 NOTE — Telephone Encounter (Signed)
Call Documentation   Tamera Reason at 07/04/2016 12:12 PM   Status: Signed    F/u Message  Pt returning RN call. please call back to discuss

## 2016-07-04 NOTE — Telephone Encounter (Signed)
This encounter was created in error - please disregard.

## 2016-07-04 NOTE — Telephone Encounter (Signed)
F/u Message ° °Pt returning RN call .please call back to discuss  °

## 2016-07-09 ENCOUNTER — Other Ambulatory Visit: Payer: Self-pay | Admitting: Internal Medicine

## 2016-07-11 NOTE — Telephone Encounter (Signed)
Last filled 03/22/16 ok to fill?

## 2016-07-13 NOTE — Telephone Encounter (Signed)
rx was faxed to pharmacy.  

## 2016-07-18 DIAGNOSIS — D689 Coagulation defect, unspecified: Secondary | ICD-10-CM | POA: Diagnosis not present

## 2016-07-18 DIAGNOSIS — R040 Epistaxis: Secondary | ICD-10-CM | POA: Insufficient documentation

## 2016-08-04 DIAGNOSIS — Z23 Encounter for immunization: Secondary | ICD-10-CM | POA: Diagnosis not present

## 2016-08-12 ENCOUNTER — Other Ambulatory Visit: Payer: Self-pay | Admitting: Internal Medicine

## 2016-08-23 ENCOUNTER — Encounter: Payer: Self-pay | Admitting: Podiatry

## 2016-08-23 ENCOUNTER — Ambulatory Visit (INDEPENDENT_AMBULATORY_CARE_PROVIDER_SITE_OTHER): Payer: Medicare Other | Admitting: Podiatry

## 2016-08-23 VITALS — BP 137/75 | HR 79 | Resp 20

## 2016-08-23 DIAGNOSIS — M79675 Pain in left toe(s): Secondary | ICD-10-CM

## 2016-08-23 DIAGNOSIS — B351 Tinea unguium: Secondary | ICD-10-CM | POA: Diagnosis not present

## 2016-08-23 NOTE — Patient Instructions (Signed)
If you want to further thin down the left great toenail apply urea cream to the toenail daily until the nail softens and file or pumice stone the nail as needed. Return as needed

## 2016-08-23 NOTE — Progress Notes (Signed)
Patient ID: GENENE WYLES, female   DOB: 02/03/1929, 82 y.o.   MRN: [id number]   Subjective: This patient presents today complaining of a thickened and uncomfortable left hallux toenail and is requesting debridement of the toenail. The last visit for similar service was on 01/11/2015  Objective:  Orientated 3 Vascular: DP pulses 1/4 bilaterally PT pulses 1/4 bilaterally Varicose veins medial ankles bilaterally  Neurological: Sensation to 10 g monofilament wire intact 5/5 bilaterally Ankle reflex equal and reactive bilaterally  Dermatological: Well-healed surgical scar medial right lower leg The left hallux nails hypertrophic, deformed and tender to palpation Remaining toenails are normal trophic  Musculoskeletal: HAV deformity right There is no restriction ankle, subtalar, midtarsal joints bilaterally  Assessment: Symptomatic mycotic left hallux toenail  Plan: Debride left hallux toenail mechanically and legs without any bleeding. Suggested topical application of urea cream to further soften the nail  Reappoint at patient's request

## 2016-09-12 ENCOUNTER — Encounter: Payer: Self-pay | Admitting: Internal Medicine

## 2016-09-12 DIAGNOSIS — E785 Hyperlipidemia, unspecified: Secondary | ICD-10-CM | POA: Diagnosis not present

## 2016-09-12 DIAGNOSIS — I10 Essential (primary) hypertension: Secondary | ICD-10-CM | POA: Diagnosis not present

## 2016-09-12 DIAGNOSIS — R739 Hyperglycemia, unspecified: Secondary | ICD-10-CM | POA: Diagnosis not present

## 2016-09-12 LAB — BASIC METABOLIC PANEL
BUN: 22 mg/dL — AB (ref 4–21)
Creatinine: 0.9 mg/dL (ref 0.5–1.1)
Glucose: 105 mg/dL
Potassium: 3.7 mmol/L (ref 3.4–5.3)
Sodium: 144 mmol/L (ref 137–147)

## 2016-09-12 LAB — CBC AND DIFFERENTIAL
HCT: 47 % — AB (ref 36–46)
Hemoglobin: 14.4 g/dL (ref 12.0–16.0)
Platelets: 134 10*3/uL — AB (ref 150–399)
WBC: 6.4 10^3/mL

## 2016-09-12 LAB — HEPATIC FUNCTION PANEL
ALT: 13 U/L (ref 7–35)
AST: 23 U/L (ref 13–35)
Alkaline Phosphatase: 74 U/L (ref 25–125)
Bilirubin, Total: 0.6 mg/dL

## 2016-09-12 LAB — LIPID PANEL
Cholesterol: 167 mg/dL (ref 0–200)
HDL: 72 mg/dL — AB (ref 35–70)
LDL Cholesterol: 83 mg/dL
Triglycerides: 56 mg/dL (ref 40–160)

## 2016-09-12 LAB — HEMOGLOBIN A1C: Hemoglobin A1C: 6.2

## 2016-09-19 ENCOUNTER — Encounter: Payer: Self-pay | Admitting: Internal Medicine

## 2016-09-20 ENCOUNTER — Encounter: Payer: Self-pay | Admitting: Internal Medicine

## 2016-09-20 ENCOUNTER — Non-Acute Institutional Stay: Payer: Medicare Other | Admitting: Internal Medicine

## 2016-09-20 VITALS — BP 110/60 | HR 85 | Temp 98.4°F | Ht 66.0 in | Wt 130.0 lb

## 2016-09-20 DIAGNOSIS — R252 Cramp and spasm: Secondary | ICD-10-CM | POA: Diagnosis not present

## 2016-09-20 DIAGNOSIS — R040 Epistaxis: Secondary | ICD-10-CM

## 2016-09-20 DIAGNOSIS — I10 Essential (primary) hypertension: Secondary | ICD-10-CM | POA: Diagnosis not present

## 2016-09-20 DIAGNOSIS — I482 Chronic atrial fibrillation, unspecified: Secondary | ICD-10-CM

## 2016-09-20 DIAGNOSIS — M8580 Other specified disorders of bone density and structure, unspecified site: Secondary | ICD-10-CM | POA: Diagnosis not present

## 2016-09-20 DIAGNOSIS — I2510 Atherosclerotic heart disease of native coronary artery without angina pectoris: Secondary | ICD-10-CM

## 2016-09-20 NOTE — Progress Notes (Signed)
Location:  Occupational psychologist of Service:  Clinic (12)  Provider: Amorette Charrette L. Mariea Clonts, D.O., C.M.D.  Code Status: DNR Goals of Care:  Advanced Directives 05/29/2016  Does patient have an advance directive? Yes  Type of Paramedic of [place];Living will  Does patient want to make changes to advanced directive? -  Copy of advanced directive(s) in chart? -  Pre-existing out of facility DNR order (yellow form or pink MOST form) -     Chief Complaint  Patient presents with  . Annual Exam    wellness exam  . MMSE    HPI: Patient is a 82 y.o. female seen today for medical management of chronic diseases.  This was meant to be her AWV but she is adamant it is just to f/u on her labs.  She lost her husband very recently.  Having cramps in the mornings.  Wonders if it's her statin therapy.  CoQ10 may have helped, but she's not sure. Willing to try again.  Does try to drink her water--has a reminder.    Once in a while her back hurts and she gets tired esp if really busy and does not rest.  Last time she used tramadol was so long ago that she can't remember.  Also hasn't used any hydrocodone either.  If she does hurt, she uses [facility] generic tylenol for aches or headaches.    No difficulty with nose bleeds since Dec.  No other bleeding either.  Continues on her eliquis 2.5mg  po bid.    Past Medical History:  Diagnosis Date  . Actinic keratosis 05/25/2014  . Anxiety   . Atrial fibrillation ([place]) 09/21/2010  . Basal cell carcinoma 05/25/2014   Multiple removed by Dr. Danella Sensing in March 2015: right neck, left neck, scalp   . Bell's palsy 07/18/1979  . Cervicalgia 01/31/2012  . Closed fracture of lumbar vertebra without mention of spinal cord injury 07/17/2005  . Conjunctiva disorder 12/26/2010  . Coronary atherosclerosis of native coronary artery 07/17/1997  . Cramp of limb 08/16/2009  . Degeneration of lumbar or lumbosacral intervertebral disc  01/31/2012  . Disturbance of skin sensation 08/2009  . Dizziness and giddiness 11/08/2011   vertigo  . External hemorrhoids without mention of complication 123XX123  . External hemorrhoids without mention of complication [id number]  . Ganglion of tendon sheath 08/16/2009  . Leg cramp 10/27/2013   Most frequently the right leg.   . Long term (current) use of anticoagulants 09/2010  . Lumbago 07/2009  . Meralgia paresthetica 07/2007  . MI, old   . Myalgia and myositis, unspecified 01/17/2012  . Osteoporosis   . Other abnormal blood chemistry 04/08/2012  . Other abnormal blood chemistry 2013   hyperglycemia  . Other disorder of muscle, ligament, and fascia 04/08/2012  . Other specified cardiac dysrhythmias(427.89) 06/27/2010  . Pain in joint, ankle and foot 10/27/2013   Bilateral since 1998   . Pain in joint, shoulder region 08/12/2012  . Pain in joint, upper arm 12/26/2010  . Pain in limb 01/11/2011  . Pain in thoracic spine 01/31/2012  . 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 PVC's (premature ventricular contractions)   . Rash and other nonspecific skin eruption 08/02/2011  . Senile osteoporosis 07/17/1993  . Unspecified essential hypertension 07/17/1997  . Varicose veins of lower extremities 08/16/2009  . Varicose veins of lower extremities 08/16/2009    Past Surgical History:  Procedure Laterality Date  . COLONOSCOPY WITH PROPOFOL N/A 10/19/2015  Procedure: COLONOSCOPY WITH PROPOFOL;  Surgeon: Gatha Mayer, MD;  Location: [facility];  Service: Endoscopy;  Laterality: N/A;  . CORONARY ARTERY BYPASS GRAFT  1998   x2; LIMA to LAD; SVG to diagonal off bypass  . HEMORRHOID SURGERY  08/26/2012   Dr. Brantley Stage  . MASS EXCISION Left 11/23/2015   Procedure: LEFT WRIST EXCISION CYST;  Surgeon: Leanora Cover, MD;  Location: [place];  Service: Orthopedics;  Laterality: Left;  . SKIN BIOPSY  01/29/14   (R) neck; (R) scalp, 2 (L) neck; shave biopsy Superficial basal cell  carcinoma Dr. Danella Sensing  . TONSILLECTOMY  1940's    Allergies  Allergen Reactions  . Iodinated Diagnostic Agents     Severe nausea and also passed out  . Sulfamethoxazole-Trimethoprim     Rash   . Bactrim     rash  . Relafen [Nabumetone] Rash      Medication List       Accurate as of 09/20/16  2:53 PM. Always use your most recent med list.          apixaban 2.5 MG Tabs tablet Commonly known as:  ELIQUIS Take 1 tablet (2.5 mg total) by mouth 2 (two) times daily.   atenolol 25 MG tablet Commonly known as:  TENORMIN Take 1 tablet (25 mg total) by mouth daily.   calcium carbonate 600 MG Tabs tablet Commonly known as:  OS-CAL Take 600 mg by mouth 2 (two) times daily with a meal.   cholecalciferol 1000 units tablet Commonly known as:  VITAMIN D Take 1,000 Units by mouth daily. For Vitamin D supplement.   digoxin 0.125 MG tablet Commonly known as:  LANOXIN TAKE 1 TABLET EVERY OTHER  DAY   hydrochlorothiazide 25 MG tablet Commonly known as:  HYDRODIURIL Take 25 mg by mouth daily.   HYDROcodone-acetaminophen 7.5-325 MG tablet Commonly known as:  NORCO Take 1 tablet by mouth every 6 (six) hours as needed for moderate pain or severe pain.   loratadine 10 MG tablet Commonly known as:  CLARITIN Take 1 tablet (10 mg total) by mouth daily.   LORazepam 1 MG tablet Commonly known as:  ATIVAN TAKE 1 TABLET BY MOUTH AT BEDTIME TO DECREASE ANXIETY AND HELP WITH SLEEP   simvastatin 20 MG tablet Commonly known as:  ZOCOR TAKE 1 TABLET ONCE DAILY   FOR CHOLESTEROL   traMADol 50 MG tablet Commonly known as:  ULTRAM Take 50 mg by mouth every 6 (six) hours as needed for moderate pain.       Review of Systems:  Review of Systems  Constitutional: Negative for chills, fever and malaise/fatigue.  HENT: Positive for hearing loss. Negative for congestion and nosebleeds.   Eyes: Negative for blurred vision.       Glasses  Respiratory: Negative for cough and shortness  of breath.   Cardiovascular: Negative for chest pain, palpitations and leg swelling.  Gastrointestinal: Negative for abdominal pain, blood in stool, constipation and melena.  Genitourinary: Negative for dysuria, frequency and urgency.  Musculoskeletal: Negative for falls.  Skin: Negative for itching and rash.  Neurological: Negative for dizziness, loss of consciousness and weakness.  Endo/Heme/Allergies: Does not bruise/bleed easily.       No problems  Psychiatric/Behavioral: Negative for depression and memory loss. The patient does not have insomnia.     Health Maintenance  Topic Date Due  . TETANUS/TDAP  10/06/2017  . INFLUENZA VACCINE  Completed  . DEXA SCAN  Completed  . ZOSTAVAX  Completed  .  PNA vac Low Risk Adult  Completed    Physical Exam: Vitals:   09/20/16 1433  BP: 110/60  Pulse: 85  Temp: 98.4 F (36.9 C)  TempSrc: Oral  SpO2: 97%  Weight: 130 lb (59 kg)  Height: 5\' 6"  (1.676 m)   Body mass index is 20.98 kg/m. Physical Exam  Constitutional: She is oriented to person, place, and time. She appears well-developed and well-nourished. No distress.  Cardiovascular: Intact distal pulses.   irreg irreg  Pulmonary/Chest: Effort normal and breath sounds normal. No respiratory distress. She has no rales.  Abdominal: Soft. Bowel sounds are normal.  Musculoskeletal: Normal range of motion.  Neurological: She is alert and oriented to person, place, and time.  Skin: Skin is warm and dry.  Psychiatric: She has a normal mood and affect.    Labs reviewed: Basic Metabolic Panel:  Recent Labs  10/19/15 0116 10/20/15 0410 10/27/15 2027 09/12/16 1035  NA 141 137 142 144  K 3.4* 3.2* 4.0 3.7  CL 109 107 102  --   CO2 24 25 31   --   GLUCOSE 108* 141* 105*  --   BUN 12 10 14  22*  CREATININE 0.58 0.71 0.78 0.9  CALCIUM 8.0* 7.9* 9.4  --    Liver Function Tests:  Recent Labs  10/17/15 2138 10/27/15 2027 09/12/16 1035  AST 22 22 23   ALT 12* 19 13  ALKPHOS  58 64 74  BILITOT 1.0 0.3  --   PROT 7.5 6.3*  --   ALBUMIN 3.9 3.1*  --    No results for input(s): LIPASE, AMYLASE in the last 8760 hours. No results for input(s): AMMONIA in the last 8760 hours. CBC:  Recent Labs  10/18/15 0235  10/20/15 0410 10/27/15 2027 09/12/16 1035  WBC 11.0*  --  7.3 6.5 6.4  NEUTROABS  --   --   --  3.7  --   HGB 11.8*  < > 11.1* 11.5* 14.4  HCT 36.0  < > 34.1* 36.5 47*  MCV 89.6  --  88.3 90.3  --   PLT 137*  --  124* 190 134*  < > = values in this interval not displayed. Lipid Panel:  Recent Labs  09/12/16 1035  CHOL 167  HDL 72*  LDLCALC 83  TRIG 56   Lab Results  Component Value Date   HGBA1C 6.2 09/12/2016   Assessment/Plan 1. Chronic atrial fibrillation (HCC) -continues on eliquis 2.5mg  po bid which is appropriate due to her age -otherwise, no difficulties recently with rate control and no bleeding since those spells of epistaxis  2. Senile osteopenia -cont walking, vitamin D, calcium with D  3. Essential hypertension -bp at goal with tenormin and hctz  4. Epistaxis, recurrent -no problem anymore since the time when she had to go to the ED   5. Coronary artery disease involving native coronary artery of native heart without angina pectoris -stable, cont secondary prevention -follows with Dr. Lovena Le  6. Muscle cramps -she wants to retry coQ10 -if that is unsuccessful, she may need to try going off her statin therapy  Labs/tests ordered: no new Next appt:  6 mos med mgt  Jazalyn Mondor L. Delita Chiquito, D.O. [facility] [address], [place] [address] Cell Phone (Mon-Fri 8am-5pm):  [phone] On Call:  [phone] & follow prompts after 5pm & weekends Office Phone:  [phone] Office Fax:  [phone]

## 2016-11-20 ENCOUNTER — Other Ambulatory Visit: Payer: Self-pay | Admitting: *Deleted

## 2016-11-20 MED ORDER — APIXABAN 2.5 MG PO TABS: 2.5000 mg | ORAL_TABLET | Freq: Two times a day (BID) | ORAL | 1 refills | Status: DC

## 2016-11-20 MED ORDER — HYDROCHLOROTHIAZIDE 25 MG PO TABS: ORAL_TABLET | ORAL | 1 refills | Status: DC

## 2016-11-20 NOTE — Telephone Encounter (Signed)
Pt last saw Dr Lovena Le 05/11/16, pt is on Eliquis 2.5mg  BID, pt is 83 y/o, last CBC and BMP 09/22/16 Cr 0.9, weight 58.7kg.  Will refill Eliquis 2.5mg  BID, dose appropriate.

## 2016-11-20 NOTE — Telephone Encounter (Signed)
[facility]

## 2016-12-12 DIAGNOSIS — R300 Dysuria: Secondary | ICD-10-CM | POA: Diagnosis not present

## 2016-12-12 DIAGNOSIS — N390 Urinary tract infection, site not specified: Secondary | ICD-10-CM | POA: Diagnosis not present

## 2016-12-12 DIAGNOSIS — Z79899 Other long term (current) drug therapy: Secondary | ICD-10-CM | POA: Diagnosis not present

## 2016-12-12 DIAGNOSIS — R319 Hematuria, unspecified: Secondary | ICD-10-CM | POA: Diagnosis not present

## 2016-12-13 DIAGNOSIS — H2513 Age-related nuclear cataract, bilateral: Secondary | ICD-10-CM | POA: Diagnosis not present

## 2016-12-13 DIAGNOSIS — H52203 Unspecified astigmatism, bilateral: Secondary | ICD-10-CM | POA: Diagnosis not present

## 2016-12-14 DIAGNOSIS — N390 Urinary tract infection, site not specified: Secondary | ICD-10-CM | POA: Diagnosis not present

## 2016-12-24 ENCOUNTER — Other Ambulatory Visit: Payer: Self-pay | Admitting: Internal Medicine

## 2017-03-21 ENCOUNTER — Encounter: Payer: Self-pay | Admitting: Internal Medicine

## 2017-03-21 ENCOUNTER — Non-Acute Institutional Stay: Payer: Medicare Other | Admitting: Internal Medicine

## 2017-03-21 VITALS — BP 124/76 | HR 77 | Temp 97.7°F | Ht 66.0 in | Wt 134.0 lb

## 2017-03-21 DIAGNOSIS — K648 Other hemorrhoids: Secondary | ICD-10-CM | POA: Diagnosis not present

## 2017-03-21 DIAGNOSIS — I2510 Atherosclerotic heart disease of native coronary artery without angina pectoris: Secondary | ICD-10-CM

## 2017-03-21 DIAGNOSIS — I482 Chronic atrial fibrillation, unspecified: Secondary | ICD-10-CM

## 2017-03-21 DIAGNOSIS — I10 Essential (primary) hypertension: Secondary | ICD-10-CM

## 2017-03-21 NOTE — Progress Notes (Signed)
Location:  Occupational psychologist of Service:  Clinic (12)  Provider: Daya Dutt L. Mariea Clonts, D.O., C.M.D.  Code Status: DNR Goals of Care:  Advanced Directives 03/21/2017  Does Patient Have a Medical Advance Directive? Yes  Type of Paramedic of [place];Living will;Out of facility DNR (pink MOST or yellow form)  Does patient want to make changes to medical advance directive? -  Copy of [place] in Chart? Yes  Pre-existing out of facility DNR order (yellow form or pink MOST form) -   Chief Complaint  Patient presents with  . Medical Management of Chronic Issues    HPI: Patient is a 83 y.o. female seen today for medical management of chronic diseases.    She is coming along well since [name]'s death.    She is due to see Dr. Lovena Le coming up.  In January, she has not been as diligent with her walking, but did walk daily last week.  Still trying to keep going actively.  May see her dermatologist for a little raised spot under the hair in front of her right ear.    No blood in stool or dark stool.  Bowels are moving well.  Uses stool softener and preparation H for hemorrhoids--no significant local blood either.    Arctic ruby oil--from [place] whales--she says it says it will help shrink her gut.  It may help with bp and cholesterol.    Tennis elbow in her left elbow.  Only played tennis onesemester in college.  5/3, had [employer] celebration.  Her elbow was so sore, she couldn't put it up to her mouth, next day better, following worse again.  Has not been doing any unnecessary or unusual movements.    Had mixed results with CoQ10.  Says she'll play it by ear.  Read about dangers of digoxin and asks about it.  Level was last done in July.  Is on lowest dose every other day so should not present a problem unless her kidneys decline for some reason.  Past Medical History:  Diagnosis Date  . Actinic keratosis 05/25/2014  .  Anxiety   . Atrial fibrillation ([place]) 09/21/2010  . Basal cell carcinoma 05/25/2014   Multiple removed by Dr. Danella Sensing in March 2015: right neck, left neck, scalp   . Bell's palsy 07/18/1979  . Cervicalgia 01/31/2012  . Closed fracture of lumbar vertebra without mention of spinal cord injury 07/17/2005  . Conjunctiva disorder 12/26/2010  . Coronary atherosclerosis of native coronary artery 07/17/1997  . Cramp of limb 08/16/2009  . Degeneration of lumbar or lumbosacral intervertebral disc 01/31/2012  . Disturbance of skin sensation 08/2009  . Dizziness and giddiness 11/08/2011   vertigo  . External hemorrhoids without mention of complication 6/94/8546  . External hemorrhoids without mention of complication 27/01/5008  . Ganglion of tendon sheath 08/16/2009  . Leg cramp 10/27/2013   Most frequently the right leg.   . Long term (current) use of anticoagulants 09/2010  . Lumbago 07/2009  . Meralgia paresthetica 07/2007  . MI, old   . Myalgia and myositis, unspecified 01/17/2012  . Osteoporosis   . Other abnormal blood chemistry 04/08/2012  . Other abnormal blood chemistry 2013   hyperglycemia  . Other disorder of muscle, ligament, and fascia 04/08/2012  . Other specified cardiac dysrhythmias(427.89) 06/27/2010  . Pain in joint, ankle and foot 10/27/2013   Bilateral since 1998   . Pain in joint, shoulder region 08/12/2012  . Pain in  joint, upper arm 12/26/2010  . Pain in limb 01/11/2011  . Pain in thoracic spine 01/31/2012  . 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 PVC's (premature ventricular contractions)   . Rash and other nonspecific skin eruption 08/02/2011  . Senile osteoporosis 07/17/1993  . Unspecified essential hypertension 07/17/1997  . Varicose veins of lower extremities 08/16/2009  . Varicose veins of lower extremities 08/16/2009    Past Surgical History:  Procedure Laterality Date  . COLONOSCOPY WITH PROPOFOL N/A 10/19/2015   Procedure: COLONOSCOPY WITH PROPOFOL;  Surgeon:  Gatha Mayer, MD;  Location: [facility];  Service: Endoscopy;  Laterality: N/A;  . CORONARY ARTERY BYPASS GRAFT  1998   x2; LIMA to LAD; SVG to diagonal off bypass  . HEMORRHOID SURGERY  08/26/2012   Dr. Brantley Stage  . MASS EXCISION Left 11/23/2015   Procedure: LEFT WRIST EXCISION CYST;  Surgeon: Leanora Cover, MD;  Location: [place];  Service: Orthopedics;  Laterality: Left;  . SKIN BIOPSY  01/29/14   (R) neck; (R) scalp, 2 (L) neck; shave biopsy Superficial basal cell carcinoma Dr. Danella Sensing  . TONSILLECTOMY  1940's    Allergies  Allergen Reactions  . Iodinated Diagnostic Agents     Severe nausea and also passed out  . Sulfamethoxazole-Trimethoprim     Rash   . Bactrim     rash  . Relafen [Nabumetone] Rash    Allergies as of 03/21/2017      Reactions   Iodinated Diagnostic Agents    Severe nausea and also passed out   Sulfamethoxazole-trimethoprim    Rash   Bactrim    rash   Relafen [nabumetone] Rash      Medication List       Accurate as of 03/21/17  2:15 PM. Always use your most recent med list.          apixaban 2.5 MG Tabs tablet Commonly known as:  ELIQUIS Take 1 tablet (2.5 mg total) by mouth 2 (two) times daily.   atenolol 25 MG tablet Commonly known as:  TENORMIN Take 1 tablet (25 mg total) by mouth daily.   calcium carbonate 600 MG Tabs tablet Commonly known as:  OS-CAL Take 600 mg by mouth 2 (two) times daily with a meal.   cholecalciferol 1000 units tablet Commonly known as:  VITAMIN D Take 1,000 Units by mouth daily. For Vitamin D supplement.   digoxin 0.125 MG tablet Commonly known as:  LANOXIN TAKE 1 TABLET EVERY OTHER  DAY   hydrochlorothiazide 25 MG tablet Commonly known as:  HYDRODIURIL Take one tablet by mouth once daily   loratadine 10 MG tablet Commonly known as:  CLARITIN Take 1 tablet (10 mg total) by mouth daily.   LORazepam 1 MG tablet Commonly known as:  ATIVAN TAKE 1 TABLET BY MOUTH AT BEDTIME TO  DECREASE ANXIETY AND HELP WITH SLEEP   simvastatin 20 MG tablet Commonly known as:  ZOCOR TAKE 1 TABLET ONCE DAILY   FOR CHOLESTEROL       Review of Systems:  Review of Systems  Constitutional: Negative for chills, fever and malaise/fatigue.  Eyes: Negative for blurred vision.  Respiratory: Negative for cough and shortness of breath.   Cardiovascular: Negative for chest pain, palpitations and leg swelling.  Gastrointestinal: Positive for constipation. Negative for abdominal pain, blood in stool and melena.       Hemorrhoids  Genitourinary: Negative for dysuria.  Musculoskeletal: Positive for joint pain. Negative for falls.  Left elbow  Skin: Negative for itching and rash.       Raised spot in front of left ear  Neurological: Negative for dizziness, loss of consciousness and weakness.  Endo/Heme/Allergies: Bruises/bleeds easily.  Psychiatric/Behavioral: Negative for depression and memory loss. The patient is not nervous/anxious and does not have insomnia.     Health Maintenance  Topic Date Due  . INFLUENZA VACCINE  06/06/2017  . TETANUS/TDAP  10/06/2017  . DEXA SCAN  Completed  . PNA vac Low Risk Adult  Completed    Physical Exam: Vitals:   03/21/17 1351  BP: 124/76  Pulse: 77  Temp: 97.7 F (36.5 C)  TempSrc: Oral  SpO2: 98%  Weight: 134 lb (60.8 kg)  Height: 5\' 6"  (1.676 m)   Body mass index is 21.63 kg/m. Physical Exam  Constitutional: She is oriented to person, place, and time. She appears well-developed and well-nourished. No distress.  Cardiovascular:  irreg irreg  Pulmonary/Chest: Effort normal and breath sounds normal. No respiratory distress.  Abdominal: Soft. Bowel sounds are normal. She exhibits no distension. There is no tenderness.  Musculoskeletal: Normal range of motion.  Neurological: She is alert and oriented to person, place, and time.  Skin: Skin is warm and dry. Capillary refill takes less than 2 seconds.  Psychiatric: She has a normal  mood and affect.    Labs reviewed: Basic Metabolic Panel:  Recent Labs  09/12/16 1035  NA 144  K 3.7  BUN 22*  CREATININE 0.9   Liver Function Tests:  Recent Labs  09/12/16 1035  AST 23  ALT 13  ALKPHOS 74   No results for input(s): LIPASE, AMYLASE in the last 8760 hours. No results for input(s): AMMONIA in the last 8760 hours. CBC:  Recent Labs  09/12/16 1035  WBC 6.4  HGB 14.4  HCT 47*  PLT 134*   Lipid Panel:  Recent Labs  09/12/16 1035  CHOL 167  HDL 72*  LDLCALC 83  TRIG 56   Lab Results  Component Value Date   HGBA1C 6.2 09/12/2016    Procedures since last visit: No results found.  Assessment/Plan 1. Chronic atrial fibrillation (HCC) -stable, no RVR, doing well on eliquis and atneolo and dig w/o problems, monitor dig annually  2. Essential hypertension -bp at goal with current therapy also, cont same   3. Coronary artery disease involving native coronary artery of native heart without angina pectoris -stable and asymptomatic, cont zocor, asa, atenolol  4. Internal hemorrhoid, bleeding -cont bowel regimen and supps as needed   Labs/tests ordered:  No new Next appt:  6 mos for AWV, med mgt   Joslin Doell L. Toya Palacios, D.O. [facility] [address], [place] [address] Cell Phone (Mon-Fri 8am-5pm):  [phone] On Call:  [phone] & follow prompts after 5pm & weekends Office Phone:  [phone] Office Fax:  [phone]

## 2017-03-31 ENCOUNTER — Other Ambulatory Visit: Payer: Self-pay

## 2017-03-31 ENCOUNTER — Emergency Department (HOSPITAL_COMMUNITY)
Admission: EM | Admit: 2017-03-31 | Discharge: 2017-03-31 | Disposition: A | Discharge status: Home / Self Care | Payer: Medicare Other | Attending: Emergency Medicine | Admitting: Emergency Medicine

## 2017-03-31 ENCOUNTER — Emergency Department (HOSPITAL_COMMUNITY): Payer: Medicare Other

## 2017-03-31 ENCOUNTER — Encounter (HOSPITAL_COMMUNITY): Payer: Self-pay | Admitting: *Deleted

## 2017-03-31 DIAGNOSIS — Z951 Presence of aortocoronary bypass graft: Secondary | ICD-10-CM | POA: Insufficient documentation

## 2017-03-31 DIAGNOSIS — R079 Chest pain, unspecified: Secondary | ICD-10-CM | POA: Diagnosis not present

## 2017-03-31 DIAGNOSIS — Z85828 Personal history of other malignant neoplasm of skin: Secondary | ICD-10-CM | POA: Diagnosis not present

## 2017-03-31 DIAGNOSIS — Z7901 Long term (current) use of anticoagulants: Secondary | ICD-10-CM | POA: Insufficient documentation

## 2017-03-31 DIAGNOSIS — Z7982 Long term (current) use of aspirin: Secondary | ICD-10-CM | POA: Insufficient documentation

## 2017-03-31 DIAGNOSIS — R0789 Other chest pain: Secondary | ICD-10-CM | POA: Diagnosis present

## 2017-03-31 DIAGNOSIS — I2510 Atherosclerotic heart disease of native coronary artery without angina pectoris: Secondary | ICD-10-CM | POA: Diagnosis not present

## 2017-03-31 DIAGNOSIS — I10 Essential (primary) hypertension: Secondary | ICD-10-CM | POA: Insufficient documentation

## 2017-03-31 DIAGNOSIS — I252 Old myocardial infarction: Secondary | ICD-10-CM | POA: Insufficient documentation

## 2017-03-31 LAB — BASIC METABOLIC PANEL
ANION GAP: 8 (ref 5–15)
BUN: 23 mg/dL — AB (ref 6–20)
CHLORIDE: 100 mmol/L — AB (ref 101–111)
CO2: 29 mmol/L (ref 22–32)
Calcium: 9.7 mg/dL (ref 8.9–10.3)
Creatinine, Ser: 1.12 mg/dL — ABNORMAL HIGH (ref 0.44–1.00)
GFR calc Af Amer: 50 mL/min — ABNORMAL LOW (ref >60)
GFR, EST NON AFRICAN AMERICAN: 44 mL/min — AB (ref >60)
GLUCOSE: 95 mg/dL (ref 65–99)
Potassium: 3.8 mmol/L (ref 3.5–5.1)
Sodium: 137 mmol/L (ref 135–145)

## 2017-03-31 LAB — CBC
HEMATOCRIT: 40.6 % (ref 36.0–46.0)
HEMOGLOBIN: 13.1 g/dL (ref 12.0–15.0)
MCH: 28.7 pg (ref 26.0–34.0)
MCHC: 32.3 g/dL (ref 30.0–36.0)
MCV: 88.8 fL (ref 78.0–100.0)
Platelets: 126 10*3/uL — ABNORMAL LOW (ref 150–400)
RBC: 4.57 MIL/uL (ref 3.87–5.11)
RDW: 13.2 % (ref 11.5–15.5)
WBC: 6.3 10*3/uL (ref 4.0–10.5)

## 2017-03-31 LAB — I-STAT TROPONIN, ED
TROPONIN I, POC: 0.01 ng/mL (ref 0.00–0.08)
Troponin i, poc: 0 ng/mL (ref 0.00–0.08)

## 2017-03-31 IMAGING — DX DG CHEST 2V
2 series · 2 of 2 positions shown · non-contrast
Comparison: None

CLINICAL DATA: Central chest pain and posterior RIGHT shoulder pain
today, history atrial fibrillation, coronary artery disease

EXAM:
CHEST  2 VIEW

[chest pa]
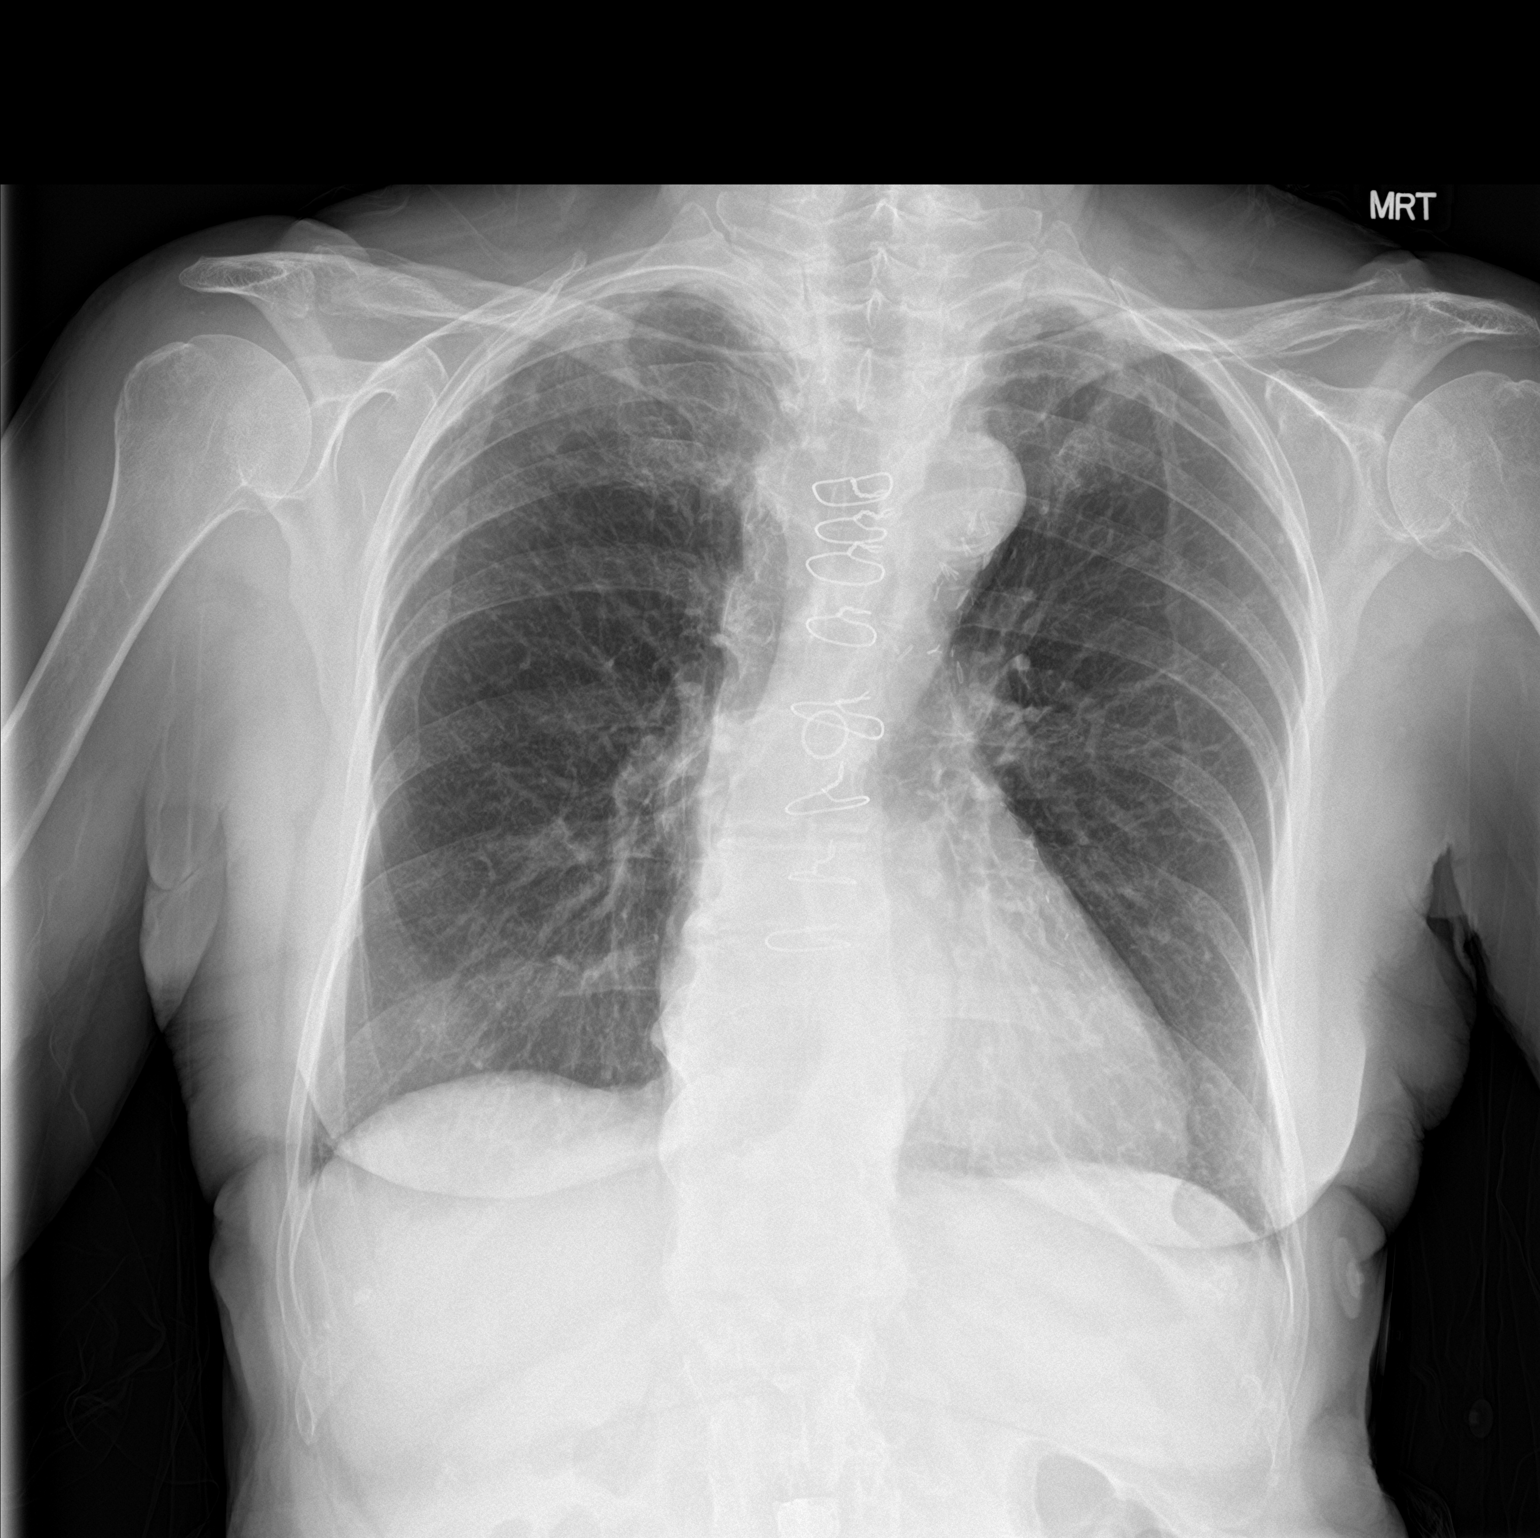

[chest lat]
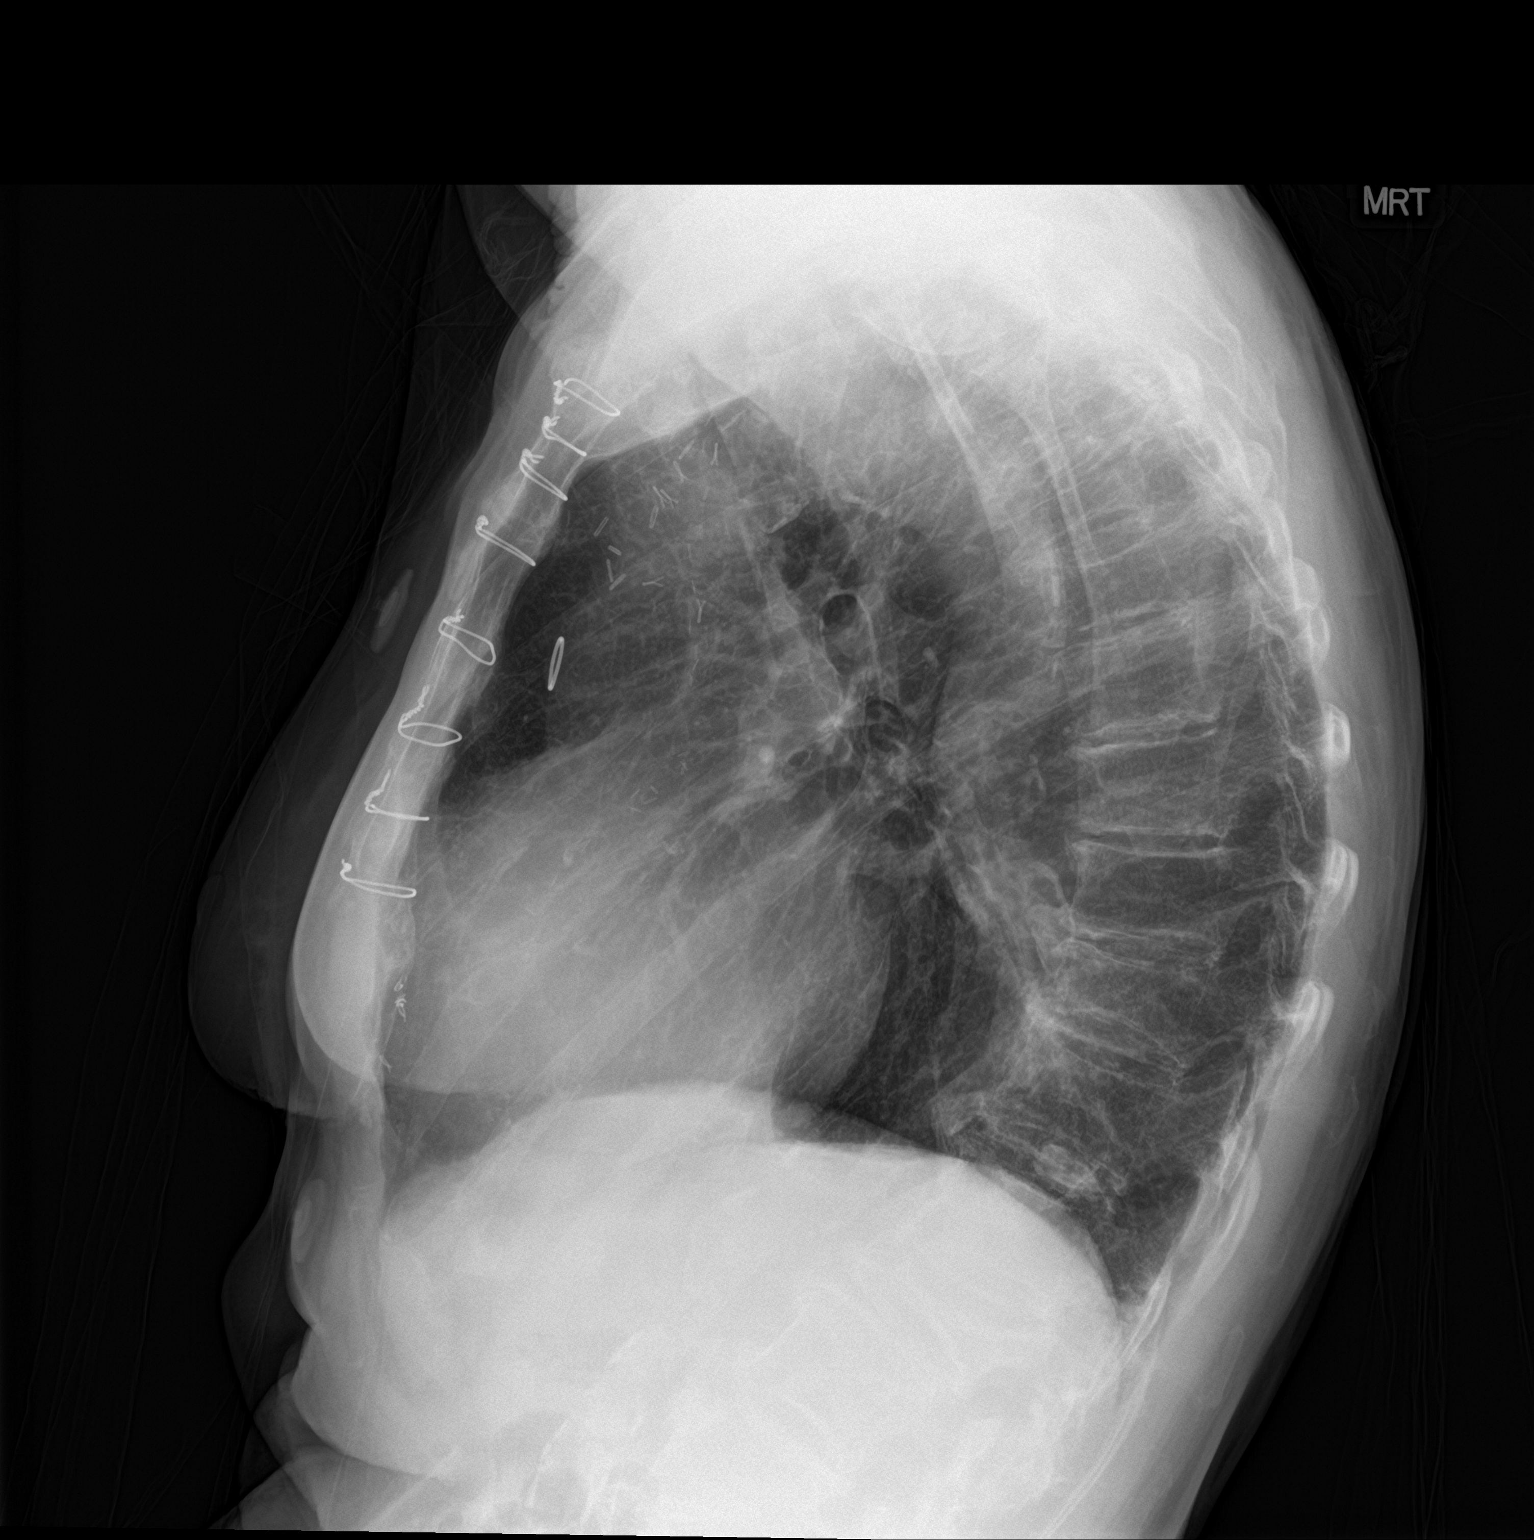

[2 of 2 positions shown; findings below may reference images not displayed]

FINDINGS: Normal heart size post CABG.

Atherosclerotic calcification and tortuosity of aorta.

Mediastinal contours and pulmonary vascularity otherwise normal.

Emphysematous changes consistent with COPD.

Biapical scarring.

Question minimal infiltrate versus scarring in RIGHT upper lobe.

Remaining lungs clear.

No pleural effusion or pneumothorax.

Scattered endplate spur formation thoracic spine with osseous
demineralization.
IMPRESSION: Post CABG.

Aortic atherosclerosis.

COPD changes with questionable infiltrate versus scarring in RIGHT
upper lobe.

## 2017-03-31 MED ORDER — FAMOTIDINE 20 MG PO TABS: 20.0000 mg | ORAL_TABLET | Freq: Two times a day (BID) | ORAL | 0 refills | Status: DC

## 2017-03-31 MED ORDER — ATENOLOL 25 MG PO TABS
25.0000 mg | ORAL_TABLET | Freq: Once | ORAL | Status: AC
  Administered 2017-03-31: 25 mg via ORAL
  Filled 2017-03-31: qty 1

## 2017-03-31 NOTE — Discharge Instructions (Signed)
Follow-up with your cardiologist, call next week to schedule a follow-up appointment. Return to the emergency room if he have any recurrent symptoms

## 2017-03-31 NOTE — ED Notes (Signed)
Pt. Eating banana brought from home. Food ok per Dr

## 2017-03-31 NOTE — ED Provider Notes (Signed)
[facility] DEPT Provider Note   CSN: [id number] Arrival date & time: 03/31/17  1447     History   Chief Complaint Chief Complaint  Patient presents with  . Chest Pain    HPI Susan Conway is a 83 y.o. female.  HPI Pt had a slight discomfort in her central chest earlier today,  2-3/10 that went to her right shoulder blade.  Lasted for about 20 minutes. No SOB.  No N/V.  She thought the symptoms were very mild but it would be a good idea to get it checked out.  SInce then she has felt fine.  She walks and gardens regularly.   That does not give her any trouble  Susan Conway went for a walk last night and felt fine.  She had a CABG back in 98.  She has not had any chest pain issues since then.  Patient initially did not mention this but in later discussion she mentioned she had some burping after the episode of her chest discomfort. Past Medical History:  Diagnosis Date  . Actinic keratosis 05/25/2014  . Anxiety   . Atrial fibrillation ([place]) 09/21/2010  . Basal cell carcinoma 05/25/2014   Multiple removed by Dr. Danella Sensing in March 2015: right neck, left neck, scalp   . Bell's palsy 07/18/1979  . Cervicalgia 01/31/2012  . Closed fracture of lumbar vertebra without mention of spinal cord injury 07/17/2005  . Conjunctiva disorder 12/26/2010  . Coronary atherosclerosis of native coronary artery 07/17/1997  . Cramp of limb 08/16/2009  . Degeneration of lumbar or lumbosacral intervertebral disc 01/31/2012  . Disturbance of skin sensation 08/2009  . Dizziness and giddiness 11/08/2011   vertigo  . External hemorrhoids without mention of complication 02/12/8118  . External hemorrhoids without mention of complication 14/78/2956  . Ganglion of tendon sheath 08/16/2009  . Leg cramp 10/27/2013   Most frequently the right leg.   . Long term (current) use of anticoagulants 09/2010  . Lumbago 07/2009  . Meralgia paresthetica 07/2007  . MI, old   . Myalgia and myositis, unspecified 01/17/2012  .  Osteoporosis   . Other abnormal blood chemistry 04/08/2012  . Other abnormal blood chemistry 2013   hyperglycemia  . Other disorder of muscle, ligament, and fascia 04/08/2012  . Other specified cardiac dysrhythmias(427.89) 06/27/2010  . Pain in joint, ankle and foot 10/27/2013   Bilateral since 1998   . Pain in joint, shoulder region 08/12/2012  . Pain in joint, upper arm 12/26/2010  . Pain in limb 01/11/2011  . Pain in thoracic spine 01/31/2012  . 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 PVC's (premature ventricular contractions)   . Rash and other nonspecific skin eruption 08/02/2011  . Senile osteoporosis 07/17/1993  . Unspecified essential hypertension 07/17/1997  . Varicose veins of lower extremities 08/16/2009  . Varicose veins of lower extremities 08/16/2009    Patient Active Problem List   Diagnosis Date Noted  . Acute maxillary sinusitis 12/15/2015  . Senile osteopenia 12/15/2015  . Lower GI bleed   . Internal bleeding hemorrhoids   . Rectal ulcer   . Constipation   . Rectal bleeding 10/17/2015  . Actinic keratosis 05/25/2014  . Basal cell carcinoma 05/25/2014  . Leg cramp 10/27/2013  . Tennis elbow 10/27/2013  . Pain in joint, ankle and foot 10/27/2013  . Pain in joint, shoulder region 06/21/2013  . Cervicalgia 06/21/2013  . Hyperglycemia 04/28/2013  . Hemorrhoids 09/16/2012  . CORONARY ATHEROSCLEROSIS NATIVE CORONARY ARTERY 09/12/2010  . Atrial fibrillation (  [place]) 09/07/2010  . Long term (current) use of anticoagulants 09/06/2010  . Hyperlipidemia 03/17/2010  . OLD MYOCARDIAL INFARCTION 03/16/2010  . PREMATURE VENTRICULAR CONTRACTIONS 03/16/2010  . Meralgia paresthetica 07/08/2007  . Essential hypertension 07/17/1997    Past Surgical History:  Procedure Laterality Date  . COLONOSCOPY WITH PROPOFOL N/A 10/19/2015   Procedure: COLONOSCOPY WITH PROPOFOL;  Surgeon: Gatha Mayer, MD;  Location: [facility];  Service: Endoscopy;  Laterality: N/A;  . CORONARY ARTERY  BYPASS GRAFT  1998   x2; LIMA to LAD; SVG to diagonal off bypass  . HEMORRHOID SURGERY  08/26/2012   Dr. Brantley Stage  . MASS EXCISION Left 11/23/2015   Procedure: LEFT WRIST EXCISION CYST;  Surgeon: Leanora Cover, MD;  Location: [facility];  Service: Orthopedics;  Laterality: Left;  . SKIN BIOPSY  01/29/14   (R) neck; (R) scalp, 2 (L) neck; shave biopsy Superficial basal cell carcinoma Dr. Danella Sensing  . TONSILLECTOMY  1940's    OB History    No data available       Home Medications    Prior to Admission medications   Medication Sig Start Date End Date Taking? Authorizing Provider  apixaban (ELIQUIS) 2.5 MG TABS tablet Take 1 tablet (2.5 mg total) by mouth 2 (two) times daily. 11/20/16  Yes [name], MD  aspirin EC 81 MG tablet Take 81 mg by mouth daily.   Yes [provider]  atenolol (TENORMIN) 25 MG tablet Take 1 tablet (25 mg total) by mouth daily. 04/07/16  Yes Reed, Tiffany L, DO  calcium carbonate (OS-CAL) 600 MG TABS Take 600 mg by mouth 2 (two) times daily with a meal.    Yes [provider]  cholecalciferol (VITAMIN D) 1000 UNITS tablet Take 1,000 Units by mouth daily. For Vitamin D supplement.   Yes [provider]  digoxin (LANOXIN) 0.125 MG tablet TAKE 1 TABLET EVERY OTHER  DAY 06/26/16  Yes [name], MD  hydrochlorothiazide (HYDRODIURIL) 25 MG tablet Take one tablet by mouth once daily Patient taking differently: Take 25 mg by mouth at bedtime.  11/20/16  Yes Reed, Tiffany L, DO  loratadine (CLARITIN) 10 MG tablet Take 1 tablet (10 mg total) by mouth daily. 03/22/16  Yes Reed, Tiffany L, DO  LORazepam (ATIVAN) 1 MG tablet TAKE 1 TABLET BY MOUTH AT BEDTIME TO DECREASE ANXIETY AND HELP WITH SLEEP Patient taking differently: Take 0.5 mg by mouth at bedtime 12/25/16  Yes Reed, Tiffany L, DO  simvastatin (ZOCOR) 20 MG tablet TAKE 1 TABLET ONCE DAILY   FOR CHOLESTEROL 08/14/16  Yes Reed, Tiffany L, DO  famotidine (PEPCID) 20 MG tablet  Take 1 tablet (20 mg total) by mouth 2 (two) times daily. 03/31/17   Dorie Rank, MD    Family History Family History  Problem Relation Age of Onset  . Heart attack Father 75  . Heart disease Father   . Hypertension Sister   . Heart disease Sister   . Heart disease Brother     Social History Social History  Substance Use Topics  . Smoking status: Never Smoker  . Smokeless tobacco: Never Used  . Alcohol use Yes     Comment: occasional wine     Allergies   Iodinated diagnostic agents; Bactrim; Relafen [nabumetone]; and Sulfamethoxazole-trimethoprim   Review of Systems Review of Systems   Physical Exam Updated Vital Signs BP (!) 167/98   Pulse 73   Temp 98.8 F (37.1 C) (Oral)   Resp 16  SpO2 95%   Physical Exam  Constitutional: She appears well-developed and well-nourished. No distress.  HENT:  Head: Normocephalic and atraumatic.  Right Ear: External ear normal.  Left Ear: External ear normal.  Eyes: Conjunctivae are normal. Right eye exhibits no discharge. Left eye exhibits no discharge. No scleral icterus.  Neck: Neck supple. No tracheal deviation present.  Cardiovascular: Normal rate, regular rhythm and intact distal pulses.   Pulmonary/Chest: Effort normal and breath sounds normal. No stridor. No respiratory distress. She has no wheezes. She has no rales.  Abdominal: Soft. Bowel sounds are normal. She exhibits no distension. There is no tenderness. There is no rebound and no guarding.  Musculoskeletal: She exhibits no edema or tenderness.  Neurological: She is alert. She has normal strength. No cranial nerve deficit (no facial droop, extraocular movements intact, no slurred speech) or sensory deficit. She exhibits normal muscle tone. She displays no seizure activity. Coordination normal.  Skin: Skin is warm and dry. No rash noted.  Psychiatric: She has a normal mood and affect.  Nursing note and vitals reviewed.    ED Treatments / Results  Labs (all labs  ordered are listed, but only abnormal results are displayed) Labs Reviewed  BASIC METABOLIC PANEL - Abnormal; Notable for the following:       Result Value   Chloride 100 (*)    BUN 23 (*)    Creatinine, Ser 1.12 (*)    GFR calc non Af Amer 44 (*)    GFR calc Af Amer 50 (*)    All other components within normal limits  CBC - Abnormal; Notable for the following:    Platelets 126 (*)    All other components within normal limits  I-STAT TROPOININ, ED  I-STAT TROPOININ, ED    EKG  EKG Interpretation  Date/Time:  Saturday Mar 31 2017 15:18:08 EDT Ventricular Rate:  74 PR Interval:    QRS Duration: 132 QT Interval:  410 QTC Calculation: 455 R Axis:   -77 Text Interpretation:  Atrial fibrillation with a competing junctional pacemaker Left axis deviation Right bundle branch block Abnormal ECG repol abnormality changes resolved since last tracing Confirmed by Dorie Rank [phone]) on 03/31/2017 5:02:11 PM       Radiology Dg Chest 2 View  Result Date: 03/31/2017 CLINICAL DATA:  Central chest pain and posterior RIGHT shoulder pain today, history atrial fibrillation, coronary artery disease EXAM: CHEST  2 VIEW COMPARISON:  None FINDINGS: Normal heart size post CABG. Atherosclerotic calcification and tortuosity of aorta. Mediastinal contours and pulmonary vascularity otherwise normal. Emphysematous changes consistent with COPD. Biapical scarring. Question minimal infiltrate versus scarring in RIGHT upper lobe. Remaining lungs clear. No pleural effusion or pneumothorax. Scattered endplate spur formation thoracic spine with osseous demineralization. IMPRESSION: Post CABG. Aortic atherosclerosis. COPD changes with questionable infiltrate versus scarring in RIGHT upper lobe. Electronically Signed   By: Lavonia Dana M.D.   On: 03/31/2017 15:41    Procedures Procedures (including critical care time)  Medications Ordered in ED Medications  atenolol (TENORMIN) tablet 25 mg (25 mg Oral Given 03/31/17  1824)     Initial Impression / Assessment and Plan / ED Course  I have reviewed the triage vital signs and the nursing notes.  Pertinent labs & imaging results that were available during my care of the patient were reviewed by me and considered in my medical decision making (see chart for details).   Pt presents to the ED with complaints of chest pain.   The sx certainly  could be consistent with an anginal episode.  The atypical factor in her history is that she is very active and generally does not have any symptoms with this activity.  Heart score of 5 puts her in a moderate risk category.    I discussed these findings with the patient. We discussed the option of staying overnight in the hospital for observation versus close outpatient follow-up. The patient preferred to go home. She understands to return to the emergency room if she has any recurrent symptoms. I asked her to call her cardiologist office on Tuesday to schedule a follow-up appointment. Final Clinical Impressions(s) / ED Diagnoses   Final diagnoses:  Chest pain, unspecified type    New Prescriptions New Prescriptions   FAMOTIDINE (PEPCID) 20 MG TABLET    Take 1 tablet (20 mg total) by mouth 2 (two) times daily.     Dorie Rank, MD 03/31/17 [phone]

## 2017-03-31 NOTE — ED Triage Notes (Signed)
Pt reports having episode of chest discomfort earlier today, lasted approx 20 mins and radiated into her right shoulder. Denies any sob,n/v or diaphoresis. Pt is pain free at triage. ekg done.

## 2017-04-01 DIAGNOSIS — R079 Chest pain, unspecified: Secondary | ICD-10-CM | POA: Diagnosis not present

## 2017-04-09 ENCOUNTER — Telehealth: Payer: Self-pay | Admitting: Internal Medicine

## 2017-04-09 ENCOUNTER — Ambulatory Visit: Payer: Medicare Other | Admitting: Physician Assistant

## 2017-04-09 NOTE — Telephone Encounter (Signed)
Called, pt unavailable. Left voice message for pt to call back.

## 2017-04-09 NOTE — Telephone Encounter (Signed)
New message    Pt is calling asking for Susan Conway to call her. It's about her appt with Renee on Wednesday.

## 2017-04-11 ENCOUNTER — Encounter: Payer: Self-pay | Admitting: Physician Assistant

## 2017-04-11 ENCOUNTER — Ambulatory Visit (INDEPENDENT_AMBULATORY_CARE_PROVIDER_SITE_OTHER): Payer: Medicare Other | Admitting: Physician Assistant

## 2017-04-11 VITALS — BP 158/94 | HR 71 | Ht 66.0 in | Wt 133.0 lb

## 2017-04-11 DIAGNOSIS — I482 Chronic atrial fibrillation: Secondary | ICD-10-CM | POA: Diagnosis not present

## 2017-04-11 DIAGNOSIS — I10 Essential (primary) hypertension: Secondary | ICD-10-CM

## 2017-04-11 DIAGNOSIS — I4821 Permanent atrial fibrillation: Secondary | ICD-10-CM

## 2017-04-11 DIAGNOSIS — I2510 Atherosclerotic heart disease of native coronary artery without angina pectoris: Secondary | ICD-10-CM | POA: Diagnosis not present

## 2017-04-11 DIAGNOSIS — R079 Chest pain, unspecified: Secondary | ICD-10-CM | POA: Diagnosis not present

## 2017-04-11 NOTE — Progress Notes (Signed)
Cardiology Office Note Date:  04/11/2017  Patient ID:  Susan Conway [birth year], MRN [id number] PCP:  [name], DO  Cardiologist:  Dr. Lovena Le     Chief Complaint: annual visit, recent ER visit  History of Present Illness: Susan Conway is a 83 y.o. female with history of CAD (remote CABG, 1998), permanent AFib, CBP, HTN, comes in today to be seen for Dr. Lovena Le.  Last seen by him July 2017, at that time mentioned a general slowing down though doing well without changes to her tx.  Most recently 03/31/17 an ER visit with c/o CP, discussed 24 observation though pt preferred d/c from ER. LABS poc Trop 0.00, 0.01 WBC 6.3 H/H 13/40 plts 126 BUN/Creat 23/1.12 K+ 3.8    Past Medical History:  Diagnosis Date  . Actinic keratosis 05/25/2014  . Anxiety   . Atrial fibrillation ([place]) 09/21/2010  . Basal cell carcinoma 05/25/2014   Multiple removed by Dr. Danella Sensing in March 2015: right neck, left neck, scalp   . Bell's palsy 07/18/1979  . Cervicalgia 01/31/2012  . Closed fracture of lumbar vertebra without mention of spinal cord injury 07/17/2005  . Conjunctiva disorder 12/26/2010  . Coronary atherosclerosis of native coronary artery 07/17/1997  . Cramp of limb 08/16/2009  . Degeneration of lumbar or lumbosacral intervertebral disc 01/31/2012  . Disturbance of skin sensation 08/2009  . Dizziness and giddiness 11/08/2011   vertigo  . External hemorrhoids without mention of complication 3/87/5643  . External hemorrhoids without mention of complication 32/95/1884  . Ganglion of tendon sheath 08/16/2009  . Leg cramp 10/27/2013   Most frequently the right leg.   . Long term (current) use of anticoagulants 09/2010  . Lumbago 07/2009  . Meralgia paresthetica 07/2007  . MI, old   . Myalgia and myositis, unspecified 01/17/2012  . Osteoporosis   . Other abnormal blood chemistry 04/08/2012  . Other abnormal blood chemistry 2013   hyperglycemia  . Other disorder of muscle, ligament, and  fascia 04/08/2012  . Other specified cardiac dysrhythmias(427.89) 06/27/2010  . Pain in joint, ankle and foot 10/27/2013   Bilateral since 1998   . Pain in joint, shoulder region 08/12/2012  . Pain in joint, upper arm 12/26/2010  . Pain in limb 01/11/2011  . Pain in thoracic spine 01/31/2012  . 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 PVC's (premature ventricular contractions)   . Rash and other nonspecific skin eruption 08/02/2011  . Senile osteoporosis 07/17/1993  . Unspecified essential hypertension 07/17/1997  . Varicose veins of lower extremities 08/16/2009  . Varicose veins of lower extremities 08/16/2009    Past Surgical History:  Procedure Laterality Date  . COLONOSCOPY WITH PROPOFOL N/A 10/19/2015   Procedure: COLONOSCOPY WITH PROPOFOL;  Surgeon: Gatha Mayer, MD;  Location: [facility];  Service: Endoscopy;  Laterality: N/A;  . CORONARY ARTERY BYPASS GRAFT  1998   x2; LIMA to LAD; SVG to diagonal off bypass  . HEMORRHOID SURGERY  08/26/2012   Dr. Brantley Stage  . MASS EXCISION Left 11/23/2015   Procedure: LEFT WRIST EXCISION CYST;  Surgeon: Leanora Cover, MD;  Location: [facility];  Service: Orthopedics;  Laterality: Left;  . SKIN BIOPSY  01/29/14   (R) neck; (R) scalp, 2 (L) neck; shave biopsy Superficial basal cell carcinoma Dr. Danella Sensing  . TONSILLECTOMY  1940's    Current Outpatient Prescriptions  Medication Sig Dispense Refill  . apixaban (ELIQUIS) 2.5 MG TABS tablet Take 1 tablet (2.5 mg total) by  mouth 2 (two) times daily. 180 tablet 1  . aspirin EC 81 MG tablet Take 81 mg by mouth daily.    Marland Kitchen atenolol (TENORMIN) 25 MG tablet Take 1 tablet (25 mg total) by mouth daily. 90 tablet 3  . calcium carbonate (OS-CAL) 600 MG TABS Take 600 mg by mouth 2 (two) times daily with a meal.     . cholecalciferol (VITAMIN D) 1000 UNITS tablet Take 1,000 Units by mouth daily. For Vitamin D supplement.    . digoxin (LANOXIN) 0.125 MG tablet TAKE 1 TABLET EVERY OTHER  DAY 45  tablet 3  . famotidine (PEPCID) 20 MG tablet Take 1 tablet (20 mg total) by mouth 2 (two) times daily. 14 tablet 0  . hydrochlorothiazide (HYDRODIURIL) 25 MG tablet Take one tablet by mouth once daily (Patient taking differently: Take 25 mg by mouth at bedtime. ) 90 tablet 1  . loratadine (CLARITIN) 10 MG tablet Take 1 tablet (10 mg total) by mouth daily. 30 tablet 11  . LORazepam (ATIVAN) 1 MG tablet TAKE 1 TABLET BY MOUTH AT BEDTIME TO DECREASE ANXIETY AND HELP WITH SLEEP (Patient taking differently: Take 0.5 mg by mouth at bedtime) 90 tablet 0  . simvastatin (ZOCOR) 20 MG tablet TAKE 1 TABLET ONCE DAILY   FOR CHOLESTEROL 90 tablet 3   No current facility-administered medications for this visit.     Allergies:   Iodinated diagnostic agents; Bactrim; Relafen [nabumetone]; and Sulfamethoxazole-trimethoprim   Social History:  The patient  reports that she has never smoked. She has never used smokeless tobacco. She reports that she drinks alcohol. She reports that she does not use drugs.   Family History:  The patient's family history includes Heart attack (age of onset: 70) in her father; Heart disease in her brother, father, and sister; Hypertension in her sister.  ROS:  Please see the history of present illness.  All other systems are reviewed and otherwise negative.   PHYSICAL EXAM:  VS:  There were no vitals taken for this visit. BMI: There is no height or weight on file to calculate BMI.   EKG:  Done 03/31/17: AFib 74bpm, RBBB, LAD, no clear ischemic changes  01/26/12: TTE Study Conclusions - Left ventricle: LVEFis normal at approximately 55 to 605 wtih hypokinesis of the posterior wall. Systolic function was normal. The estimated ejection fraction was in the range of 55% to 60%. - Mitral valve: Mild regurgitation. - Tricuspid valve: Mild-moderate regurgitation. - Pulmonary arteries: PA peak pressure: 62mm Hg (S).  11/21/06:  Negative stress myoview   Recent  Labs: 09/12/2016: ALT 13 03/31/2017: BUN 23; Creatinine, Ser 1.12; Hemoglobin 13.1; Platelets 126; Potassium 3.8; Sodium 137  09/12/2016: Cholesterol 167; HDL 72; LDL Cholesterol 83; Triglycerides 56   CrCl cannot be calculated (Unknown ideal weight.).   Wt Readings from Last 3 Encounters:  03/21/17 134 lb (60.8 kg)  09/20/16 130 lb (59 kg)  05/11/16 129 lb 6.4 oz (58.7 kg)     Other studies reviewed: Additional studies/records reviewed today include: summarized above  ASSESSMENT AND PLAN:  1. CP     2. CAD      3. HTN       4. Permanent AFib     CHA2DS2Vasc is 5, on Eliquis, appropriate reduced dose for age/weight  Disposition: F/u with   Current medicines are reviewed at length with the patient today.  The patient did not have any concerns regarding medicines.  Haywood Lasso, PA-C 04/11/2017 5:23 AM  [facility] [address]  [place] [phone] (office)  [phone] (fax)

## 2017-04-11 NOTE — Patient Instructions (Addendum)
Medication Instructions:   Your physician recommends that you continue on your current medications as directed. Please refer to the Current Medication list given to you today.   If you need a refill on your cardiac medications before your next appointment, please call your pharmacy.  Labwork: NONE ORDERED  TODAY    Testing/Procedures: Your physician has requested that you have an echocardiogram. Echocardiography is a painless test that uses sound waves to create images of your heart. It provides your doctor with information about the size and shape of your heart and how well your heart's chambers and valves are working. This procedure takes approximately one hour. There are no restrictions for this procedure.       Follow-Up: Your physician wants you to follow-up in:  [place] will receive a reminder letter in the mail two months in advance. If you don't receive a letter, please call our office to schedule the follow-up appointment.      Any Other Special Instructions Will Be Listed Below (If Applicable).

## 2017-04-11 NOTE — Progress Notes (Signed)
Cardiology Office Note Date:  04/11/2017  Patient ID:  Susan Conway, Susan Conway [birth year], MRN [id number] PCP:  [name], DO  Cardiologist:  Dr. Lovena Le    Chief Complaint: annual visit, recent ER visit  History of Present Illness: Susan Conway is a 83 y.o. female with history of CAD (remote CABG, 1998), permanent AFib, CBP, HTN, comes in today to be seen for Dr. Lovena Le.  Last seen by him July 2017, at that time mentioned a general slowing down though doing well without changes to her tx.  Most recently 03/31/17 an ER visit with c/o CP, discussed 24 observation though pt preferred d/c from ER. LABS poc Trop 0.00, 0.01 WBC 6.3 H/H 13/40 plts 126 BUN/Creat 23/1.12 K+ 3.8  The patient states she was at rest, doing nothing in particular that day when she developed an "ever so slight" pressure to the center/right chest that did radiate towards the R scapula, it was not associated with any other symptoms, no palpitations or SOB, no N/V, no diaphoresis.  She said while she wasn't overly concerned she did have the RN come and check on her and she said that they needed to call EMS.  EMS told her the EKG looked OK and her viatls were OK but for safety suggested she go to the ER.  She did not accept EMS transport but her neighbor brought her.  She thinks it stayed about a 1/2 hour or less, just went away.  She has not had it again. She lives in [place], is very active walking 1-1.14miles most days, and participates in their walk-a-thons they have 1-2x year and this ends up being 77miles in a month.  She walks most everywhere within her community, very active physically and socially.  Never with any of her activities or otherwise before or since the ER visit has she had any symptoms of CP, palpitations SOB or DOE, no dizziness, near syncope or syncope.  She does mention that occasionally when she first wakes up she had a L calf cramping that she is able to stretch out and as she gets up and ambulates  resolves.  She denies any calf pain with her exercise or ambulation ever.  She had a nose bleed this morning, was easy to stop, and was without trauma, her last nose bleed was a year ago.  No other bleeding no signs of bleeding.  Past Medical History:  Diagnosis Date  . Actinic keratosis 05/25/2014  . Anxiety   . Atrial fibrillation ([place]) 09/21/2010  . Basal cell carcinoma 05/25/2014   Multiple removed by Dr. Danella Sensing in March 2015: right neck, left neck, scalp   . Bell's palsy 07/18/1979  . Cervicalgia 01/31/2012  . Closed fracture of lumbar vertebra without mention of spinal cord injury 07/17/2005  . Conjunctiva disorder 12/26/2010  . Coronary atherosclerosis of native coronary artery 07/17/1997  . Cramp of limb 08/16/2009  . Degeneration of lumbar or lumbosacral intervertebral disc 01/31/2012  . Disturbance of skin sensation 08/2009  . Dizziness and giddiness 11/08/2011   vertigo  . External hemorrhoids without mention of complication 05/15/6268  . External hemorrhoids without mention of complication 48/54/6270  . Ganglion of tendon sheath 08/16/2009  . Leg cramp 10/27/2013   Most frequently the right leg.   . Long term (current) use of anticoagulants 09/2010  . Lumbago 07/2009  . Meralgia paresthetica 07/2007  . MI, old   . Myalgia and myositis, unspecified 01/17/2012  . Osteoporosis   .  Other abnormal blood chemistry 04/08/2012  . Other abnormal blood chemistry 2013   hyperglycemia  . Other disorder of muscle, ligament, and fascia 04/08/2012  . Other specified cardiac dysrhythmias(427.89) 06/27/2010  . Pain in joint, ankle and foot 10/27/2013   Bilateral since 1998   . Pain in joint, shoulder region 08/12/2012  . Pain in joint, upper arm 12/26/2010  . Pain in limb 01/11/2011  . Pain in thoracic spine 01/31/2012  . 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 PVC's (premature ventricular contractions)   . Rash and other nonspecific skin eruption 08/02/2011  . Senile osteoporosis  07/17/1993  . Unspecified essential hypertension 07/17/1997  . Varicose veins of lower extremities 08/16/2009  . Varicose veins of lower extremities 08/16/2009    Past Surgical History:  Procedure Laterality Date  . COLONOSCOPY WITH PROPOFOL N/A 10/19/2015   Procedure: COLONOSCOPY WITH PROPOFOL;  Surgeon: Gatha Mayer, MD;  Location: [facility];  Service: Endoscopy;  Laterality: N/A;  . CORONARY ARTERY BYPASS GRAFT  1998   x2; LIMA to LAD; SVG to diagonal off bypass  . HEMORRHOID SURGERY  08/26/2012   Dr. Brantley Stage  . MASS EXCISION Left 11/23/2015   Procedure: LEFT WRIST EXCISION CYST;  Surgeon: Leanora Cover, MD;  Location: [facility];  Service: Orthopedics;  Laterality: Left;  . SKIN BIOPSY  01/29/14   (R) neck; (R) scalp, 2 (L) neck; shave biopsy Superficial basal cell carcinoma Dr. Danella Sensing  . TONSILLECTOMY  1940's    Current Outpatient Prescriptions  Medication Sig Dispense Refill  . apixaban (ELIQUIS) 2.5 MG TABS tablet Take 1 tablet (2.5 mg total) by mouth 2 (two) times daily. 180 tablet 1  . aspirin EC 81 MG tablet Take 81 mg by mouth daily.    Marland Kitchen atenolol (TENORMIN) 25 MG tablet Take 1 tablet (25 mg total) by mouth daily. 90 tablet 3  . calcium carbonate (OS-CAL) 600 MG TABS Take 600 mg by mouth 2 (two) times daily with a meal.     . cholecalciferol (VITAMIN D) 1000 UNITS tablet Take 1,000 Units by mouth daily. For Vitamin D supplement.    . digoxin (LANOXIN) 0.125 MG tablet TAKE 1 TABLET EVERY OTHER  DAY 45 tablet 3  . famotidine (PEPCID) 20 MG tablet Take 1 tablet (20 mg total) by mouth 2 (two) times daily. 14 tablet 0  . hydrochlorothiazide (HYDRODIURIL) 25 MG tablet Take one tablet by mouth once daily (Patient taking differently: Take 25 mg by mouth at bedtime. ) 90 tablet 1  . LORazepam (ATIVAN) 1 MG tablet TAKE 1 TABLET BY MOUTH AT BEDTIME TO DECREASE ANXIETY AND HELP WITH SLEEP (Patient taking differently: Take 0.5 mg by mouth at bedtime) 90 tablet 0  .  simvastatin (ZOCOR) 20 MG tablet TAKE 1 TABLET ONCE DAILY   FOR CHOLESTEROL 90 tablet 3   No current facility-administered medications for this visit.     Allergies:   Iodinated diagnostic agents; Bactrim; Relafen [nabumetone]; and Sulfamethoxazole-trimethoprim   Social History:  The patient  reports that she has never smoked. She has never used smokeless tobacco. She reports that she drinks alcohol. She reports that she does not use drugs.   Family History:  The patient's family history includes Heart attack (age of onset: 77) in her father; Heart disease in her brother, father, and sister; Hypertension in her sister.  ROS:  Please see the history of present illness.  All other systems are reviewed and otherwise negative.   PHYSICAL EXAM:  VS:  BP (!) 158/94   Pulse 71   Ht 5\' 6"  (1.676 m)   Wt 133 lb (60.3 kg)   BMI 21.47 kg/m  BMI: Body mass index is 21.47 kg/m. Well nourished, well developed, in no acute distress  HEENT: normocephalic, atraumatic  Neck: no JVD, carotid bruits or masses Cardiac:  IRRR; no significant murmurs, no rubs, or gallops Lungs:  CTA b/l, no wheezing, rhonchi or rales  Abd: soft, nontender MS: no deformity, age appropriate atrophy Ext: no edema, numerous small superficial veins noted b/l, no calf tenderness or tenderness or skin changes, b/l, 2+ pedal pulses equal b/l  Skin: warm and dry, no rash Neuro:  No gross deficits appreciated Psych: euthymic mood, full affect   EKG:  Done 03/31/17: AFib 74bpm, RBBB, LAD, no clear ischemic changes  01/26/12: TTE Study Conclusions - Left ventricle: LVEFis normal at approximately 55 to 605 wtih hypokinesis of the posterior wall. Systolic function was normal. The estimated ejection fraction was in the range of 55% to 60%. - Mitral valve: Mild regurgitation. - Tricuspid valve: Mild-moderate regurgitation. - Pulmonary arteries: PA peak pressure: 61mm Hg (S).  11/21/06:  Negative stress  myoview   Recent Labs: 09/12/2016: ALT 13 03/31/2017: BUN 23; Creatinine, Ser 1.12; Hemoglobin 13.1; Platelets 126; Potassium 3.8; Sodium 137  09/12/2016: Cholesterol 167; HDL 72; LDL Cholesterol 83; Triglycerides 56   Estimated Creatinine Clearance: 34.4 mL/min (A) (by C-G formula based on SCr of 1.12 mg/dL (H)).   Wt Readings from Last 3 Encounters:  04/11/17 133 lb (60.3 kg)  03/21/17 134 lb (60.8 kg)  09/20/16 130 lb (59 kg)     Other studies reviewed: Additional studies/records reviewed today include: summarized above  ASSESSMENT AND PLAN:  1. CP     As described and not recurrent     R/o and d/c from the ER  Discussed treatment/eval options from nothing with observation if recurrent > stress test, check an echo and if change/concerns base on that finiding do further, or proceed with stress testing directly.  She is comfortable starting with the echo and go from there  2. CAD     On ASA, BB, statin  3. HTN     Elevated initially a recheck is 132/70     She isasked to keep an eye on it at home, notify if routinely >150/90  4. Permanent AFib     CHA2DS2Vasc is 5, on Eliquis, appropriate reduced dose for age/weight     Spontaneous, easily controlled nose bleed this morning, if recurrent let us know (if so will stop ASA, though wait on her echo first)     Weight today with clothes is 60.3, no change to her Eliquis      Disposition: F/u with echo as planned and 6 month f/u, sooner if needed pending echo findings or if recurrent symptoms.   I will ask that at her echo she have a dig level drawn and be instructed not to take her digoxin that morning until after lab draw  Current medicines are reviewed at length with the patient today.  The patient did not have any concerns regarding medicines.  Haywood Lasso, PA-C 04/11/2017 1:19 PM     [address] [place] [address] [phone] (office)  [phone] (fax)

## 2017-04-24 ENCOUNTER — Other Ambulatory Visit: Payer: Self-pay

## 2017-04-24 ENCOUNTER — Ambulatory Visit (HOSPITAL_COMMUNITY): Payer: Medicare Other | Attending: Cardiovascular Disease

## 2017-04-24 DIAGNOSIS — I272 Pulmonary hypertension, unspecified: Secondary | ICD-10-CM | POA: Insufficient documentation

## 2017-04-24 DIAGNOSIS — I081 Rheumatic disorders of both mitral and tricuspid valves: Secondary | ICD-10-CM | POA: Insufficient documentation

## 2017-04-24 DIAGNOSIS — R079 Chest pain, unspecified: Secondary | ICD-10-CM | POA: Insufficient documentation

## 2017-04-26 ENCOUNTER — Telehealth: Payer: Self-pay | Admitting: *Deleted

## 2017-04-26 NOTE — Telephone Encounter (Signed)
-----   Message from [facility], [place] sent at 04/26/2017  8:21 AM EDT ----- Please let the patient know her echo looked OK.  Heart muscle is strong, some heart murmurs, though nothing of any severity that needs any further evaluation or of concern.  Please inquire if she has had any kind of recurrent chest doscomfort, if so, please let me know.  If not, have her stop the aspirin and continue her Eliquis unchanged.  Thanks [employer]

## 2017-04-26 NOTE — Telephone Encounter (Signed)
SPOKE TO PT ABOUT RESULTS AND TO STOP ASPIRIN 81 MG DUE TO NO CHEST DISCOMFORT STATED BY PT.

## 2017-05-08 ENCOUNTER — Other Ambulatory Visit: Payer: Self-pay | Admitting: Internal Medicine

## 2017-05-24 ENCOUNTER — Other Ambulatory Visit: Payer: Self-pay | Admitting: *Deleted

## 2017-05-24 DIAGNOSIS — L821 Other seborrheic keratosis: Secondary | ICD-10-CM | POA: Diagnosis not present

## 2017-05-24 DIAGNOSIS — Z85828 Personal history of other malignant neoplasm of skin: Secondary | ICD-10-CM | POA: Diagnosis not present

## 2017-05-24 MED ORDER — ATENOLOL 25 MG PO TABS: 25.0000 mg | ORAL_TABLET | Freq: Every day | ORAL | 3 refills | Status: DC

## 2017-05-24 NOTE — Telephone Encounter (Signed)
[facility]

## 2017-06-08 ENCOUNTER — Other Ambulatory Visit: Payer: Self-pay | Admitting: Internal Medicine

## 2017-06-08 NOTE — Telephone Encounter (Signed)
Pt last saw Tommye Standard, [place] on 04/11/17, last labs 03/31/17 Creat 1.12, age 83, weight 60.3, based on specified criteria pt is on appropriate dosage of Eliquis 2.5mg  BID.  Will refill rx.

## 2017-07-12 ENCOUNTER — Ambulatory Visit: Payer: Medicare Other | Admitting: Internal Medicine

## 2017-07-16 ENCOUNTER — Other Ambulatory Visit: Payer: Self-pay | Admitting: *Deleted

## 2017-07-16 MED ORDER — DIGOXIN 125 MCG PO TABS: 125.0000 ug | ORAL_TABLET | ORAL | 2 refills | Status: DC

## 2017-07-16 MED ORDER — SIMVASTATIN 20 MG PO TABS: 20.0000 mg | ORAL_TABLET | Freq: Every day | ORAL | 3 refills | Status: DC

## 2017-08-13 ENCOUNTER — Telehealth: Payer: Self-pay | Admitting: Internal Medicine

## 2017-08-13 NOTE — Telephone Encounter (Signed)
Left message to confirm AWV-S appointment at [facility] clinic. VDM (DD)

## 2017-08-14 ENCOUNTER — Non-Acute Institutional Stay: Payer: Medicare Other

## 2017-08-14 VITALS — BP 130/74 | HR 84 | Temp 97.8°F | Ht 66.0 in | Wt 134.0 lb

## 2017-08-14 DIAGNOSIS — H9191 Unspecified hearing loss, right ear: Secondary | ICD-10-CM | POA: Diagnosis not present

## 2017-08-14 DIAGNOSIS — Z0000 Encounter for general adult medical examination without abnormal findings: Secondary | ICD-10-CM

## 2017-08-14 MED ORDER — ZOSTER VAC RECOMB ADJUVANTED 50 MCG/0.5ML IM SUSR
0.5000 mL | Freq: Once | INTRAMUSCULAR | 1 refills | Status: AC
Start: 2017-08-14 — End: 2017-08-14

## 2017-08-14 NOTE — Patient Instructions (Signed)
Susan Conway , Thank you for taking time to come for your Medicare Wellness Visit. I appreciate your ongoing commitment to your health goals. Please review the following plan we discussed and let me know if I can assist you in the future.   Screening recommendations/referrals: Colonoscopy excluded, you are over age 81 Mammogram excluded, you are over age 34 Bone Density up to date Recommended yearly ophthalmology/optometry visit for glaucoma screening and checkup Recommended yearly dental visit for hygiene and checkup  Vaccinations: Influenza vaccine due, please receive when [facility] gets it in stock Pneumococcal vaccine up to date Tdap vaccine up to date. Due 11/06/2017 Shingles vaccine due, prescription has been sent to your pharmacy. Wait 4 weeks after you get your flu vaccine.   Advanced directives: In Chart  Conditions/risks identified: None  Next appointment: Dr. Mariea Clonts 09/19/17 @1 :30 pm   Preventive Care 65 Years and Older, Female Preventive care refers to lifestyle choices and visits with your health care provider that can promote health and wellness. What does preventive care include?  A yearly physical exam. This is also called an annual well check.  Dental exams once or twice a year.  Routine eye exams. Ask your health care provider how often you should have your eyes checked.  Personal lifestyle choices, including:  Daily care of your teeth and gums.  Regular physical activity.  Eating a healthy diet.  Avoiding tobacco and drug use.  Limiting alcohol use.  Practicing safe sex.  Taking low-dose aspirin every day.  Taking vitamin and mineral supplements as recommended by your health care provider. What happens during an annual well check? The services and screenings done by your health care provider during your annual well check will depend on your age, overall health, lifestyle risk factors, and family history of disease. Counseling  Your health care provider  may ask you questions about your:  Alcohol use.  Tobacco use.  Drug use.  Emotional well-being.  Home and relationship well-being.  Sexual activity.  Eating habits.  History of falls.  Memory and ability to understand (cognition).  Work and work Statistician.  Reproductive health. Screening  You may have the following tests or measurements:  Height, weight, and BMI.  Blood pressure.  Lipid and cholesterol levels. These may be checked every 5 years, or more frequently if you are over 17 years old.  Skin check.  Lung cancer screening. You may have this screening every year starting at age 53 if you have a 30-pack-year history of smoking and currently smoke or have quit within the past 15 years.  Fecal occult blood test (FOBT) of the stool. You may have this test every year starting at age 80.  Flexible sigmoidoscopy or colonoscopy. You may have a sigmoidoscopy every 5 years or a colonoscopy every 10 years starting at age 19.  Hepatitis C blood test.  Hepatitis B blood test.  Sexually transmitted disease (STD) testing.  Diabetes screening. This is done by checking your blood sugar (glucose) after you have not eaten for a while (fasting). You may have this done every 1-3 years.  Bone density scan. This is done to screen for osteoporosis. You may have this done starting at age 41.  Mammogram. This may be done every 1-2 years. Talk to your health care provider about how often you should have regular mammograms. Talk with your health care provider about your test results, treatment options, and if necessary, the need for more tests. Vaccines  Your health care provider may recommend certain  vaccines, such as:  Influenza vaccine. This is recommended every year.  Tetanus, diphtheria, and acellular pertussis (Tdap, Td) vaccine. You may need a Td booster every 10 years.  Zoster vaccine. You may need this after age 26.  Pneumococcal 13-valent conjugate (PCV13) vaccine.  One dose is recommended after age 46.  Pneumococcal polysaccharide (PPSV23) vaccine. One dose is recommended after age 29. Talk to your health care provider about which screenings and vaccines you need and how often you need them. This information is not intended to replace advice given to you by your health care provider. Make sure you discuss any questions you have with your health care provider. Document Released: 11/19/2015 Document Revised: 07/12/2016 Document Reviewed: 08/24/2015 Elsevier Interactive Patient Education  2017 [place] Prevention in the Home Falls can cause injuries. They can happen to people of all ages. There are many things you can do to make your home safe and to help prevent falls. What can I do on the outside of my home?  Regularly fix the edges of walkways and driveways and fix any cracks.  Remove anything that might make you trip as you walk through a door, such as a raised step or threshold.  Trim any bushes or trees on the path to your home.  Use bright outdoor lighting.  Clear any walking paths of anything that might make someone trip, such as rocks or tools.  Regularly check to see if handrails are loose or broken. Make sure that both sides of any steps have handrails.  Any raised decks and porches should have guardrails on the edges.  Have any leaves, snow, or ice cleared regularly.  Use sand or salt on walking paths during winter.  Clean up any spills in your garage right away. This includes oil or grease spills. What can I do in the bathroom?  Use night lights.  Install grab bars by the toilet and in the tub and shower. Do not use towel bars as grab bars.  Use non-skid mats or decals in the tub or shower.  If you need to sit down in the shower, use a plastic, non-slip stool.  Keep the floor dry. Clean up any water that spills on the floor as soon as it happens.  Remove soap buildup in the tub or shower regularly.  Attach bath  mats securely with double-sided non-slip rug tape.  Do not have throw rugs and other things on the floor that can make you trip. What can I do in the bedroom?  Use night lights.  Make sure that you have a light by your bed that is easy to reach.  Do not use any sheets or blankets that are too big for your bed. They should not hang down onto the floor.  Have a firm chair that has side arms. You can use this for support while you get dressed.  Do not have throw rugs and other things on the floor that can make you trip. What can I do in the kitchen?  Clean up any spills right away.  Avoid walking on wet floors.  Keep items that you use a lot in easy-to-reach places.  If you need to reach something above you, use a strong step stool that has a grab bar.  Keep electrical cords out of the way.  Do not use floor polish or wax that makes floors slippery. If you must use wax, use non-skid floor wax.  Do not have throw rugs and other things  on the floor that can make you trip. What can I do with my stairs?  Do not leave any items on the stairs.  Make sure that there are handrails on both sides of the stairs and use them. Fix handrails that are broken or loose. Make sure that handrails are as long as the stairways.  Check any carpeting to make sure that it is firmly attached to the stairs. Fix any carpet that is loose or worn.  Avoid having throw rugs at the top or bottom of the stairs. If you do have throw rugs, attach them to the floor with carpet tape.  Make sure that you have a light switch at the top of the stairs and the bottom of the stairs. If you do not have them, ask someone to add them for you. What else can I do to help prevent falls?  Wear shoes that:  Do not have high heels.  Have rubber bottoms.  Are comfortable and fit you well.  Are closed at the toe. Do not wear sandals.  If you use a stepladder:  Make sure that it is fully opened. Do not climb a closed  stepladder.  Make sure that both sides of the stepladder are locked into place.  Ask someone to hold it for you, if possible.  Clearly mark and make sure that you can see:  Any grab bars or handrails.  First and last steps.  Where the edge of each step is.  Use tools that help you move around (mobility aids) if they are needed. These include:  Canes.  Walkers.  Scooters.  Crutches.  Turn on the lights when you go into a dark area. Replace any light bulbs as soon as they burn out.  Set up your furniture so you have a clear path. Avoid moving your furniture around.  If any of your floors are uneven, fix them.  If there are any pets around you, be aware of where they are.  Review your medicines with your doctor. Some medicines can make you feel dizzy. This can increase your chance of falling. Ask your doctor what other things that you can do to help prevent falls. This information is not intended to replace advice given to you by your health care provider. Make sure you discuss any questions you have with your health care provider. Document Released: 08/19/2009 Document Revised: 03/30/2016 Document Reviewed: 11/27/2014 Elsevier Interactive Patient Education  2017 [employer].

## 2017-08-14 NOTE — Addendum Note (Signed)
Addended by: Rich Reining E on: 08/14/2017 04:00 PM   Modules accepted: Orders

## 2017-08-14 NOTE — Progress Notes (Signed)
Subjective:   Susan Conway is a 83 y.o. female who presents for Medicare Annual (Subsequent) preventive examination at [facility]  Last AWV-09/22/2015    Objective:     Vitals: BP 130/74 (BP Location: Right Arm, Patient Position: Sitting)   Pulse 84   Temp 97.8 F (36.6 C) (Oral)   Ht 5\' 6"  (1.676 m)   Wt 134 lb (60.8 kg)   SpO2 97%   BMI 21.63 kg/m   Body mass index is 21.63 kg/m.   Tobacco History  Smoking Status  . Never Smoker  Smokeless Tobacco  . Never Used     Counseling given: Not Answered   Past Medical History:  Diagnosis Date  . Actinic keratosis 05/25/2014  . Anxiety   . Atrial fibrillation ([place]) 09/21/2010  . Basal cell carcinoma 05/25/2014   Multiple removed by Dr. Danella Sensing in March 2015: right neck, left neck, scalp   . Bell's palsy 07/18/1979  . Cervicalgia 01/31/2012  . Closed fracture of lumbar vertebra without mention of spinal cord injury 07/17/2005  . Conjunctiva disorder 12/26/2010  . Coronary atherosclerosis of native coronary artery 07/17/1997  . Cramp of limb 08/16/2009  . Degeneration of lumbar or lumbosacral intervertebral disc 01/31/2012  . Disturbance of skin sensation 08/2009  . Dizziness and giddiness 11/08/2011   vertigo  . External hemorrhoids without mention of complication 7/82/4235  . External hemorrhoids without mention of complication 36/14/4315  . Ganglion of tendon sheath 08/16/2009  . Leg cramp 10/27/2013   Most frequently the right leg.   . Long term (current) use of anticoagulants 09/2010  . Lumbago 07/2009  . Meralgia paresthetica 07/2007  . MI, old   . Myalgia and myositis, unspecified 01/17/2012  . Osteoporosis   . Other abnormal blood chemistry 04/08/2012  . Other abnormal blood chemistry 2013   hyperglycemia  . Other disorder of muscle, ligament, and fascia 04/08/2012  . Other specified cardiac dysrhythmias(427.89) 06/27/2010  . Pain in joint, ankle and foot 10/27/2013   Bilateral since  1998   . Pain in joint, shoulder region 08/12/2012  . Pain in joint, upper arm 12/26/2010  . Pain in limb 01/11/2011  . Pain in thoracic spine 01/31/2012  . 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 PVC's (premature ventricular contractions)   . Rash and other nonspecific skin eruption 08/02/2011  . Senile osteoporosis 07/17/1993  . Unspecified essential hypertension 07/17/1997  . Varicose veins of lower extremities 08/16/2009  . Varicose veins of lower extremities 08/16/2009   Past Surgical History:  Procedure Laterality Date  . COLONOSCOPY WITH PROPOFOL N/A 10/19/2015   Procedure: COLONOSCOPY WITH PROPOFOL;  Surgeon: Gatha Mayer, MD;  Location: [facility];  Service: Endoscopy;  Laterality: N/A;  . CORONARY ARTERY BYPASS GRAFT  1998   x2; LIMA to LAD; SVG to diagonal off bypass  . HEMORRHOID SURGERY  08/26/2012   Dr. Brantley Stage  . MASS EXCISION Left 11/23/2015   Procedure: LEFT WRIST EXCISION CYST;  Surgeon: Leanora Cover, MD;  Location: [place];  Service: Orthopedics;  Laterality: Left;  . SKIN BIOPSY  01/29/14   (R) neck; (R) scalp, 2 (L) neck; shave biopsy Superficial basal cell carcinoma Dr. Danella Sensing  . TONSILLECTOMY  1940's   Family History  Problem Relation Age of Onset  . Heart attack Father 40  . Heart disease Father   . Hypertension Sister   . Heart disease Sister   . Heart disease Brother    History  Sexual Activity  . Sexual activity: No    Outpatient Encounter Prescriptions as of 08/14/2017  Medication Sig  . atenolol (TENORMIN) 25 MG tablet Take 1 tablet (25 mg total) by mouth daily.  . calcium carbonate (OS-CAL) 600 MG TABS Take 600 mg by mouth 2 (two) times daily with a meal.   . cholecalciferol (VITAMIN D) 1000 UNITS tablet Take 1,000 Units by mouth daily. For Vitamin D supplement.  . digoxin (LANOXIN) 0.125 MG tablet Take 1 tablet (125 mcg total) by mouth every other day.  Marland Kitchen ELIQUIS 2.5 MG TABS tablet TAKE 1 TABLET TWICE A DAY  .  famotidine (PEPCID) 20 MG tablet Take 1 tablet (20 mg total) by mouth 2 (two) times daily.  . hydrochlorothiazide (HYDRODIURIL) 25 MG tablet TAKE 1 TABLET ONCE DAILY  . LORazepam (ATIVAN) 1 MG tablet TAKE 1 TABLET AT BEDTIME TO DECREASE ANXIETY AND HELP WITH SLEEP  . simvastatin (ZOCOR) 20 MG tablet Take 1 tablet (20 mg total) by mouth daily at 6 PM.  . Zoster Vac Recomb Adjuvanted [facility]) injection Inject 0.5 mLs into the muscle once.  . [DISCONTINUED] Zoster Vac Recomb Adjuvanted (SHINGRIX) injection Inject 0.5 mLs into the muscle once.   No facility-administered encounter medications on file as of 08/14/2017.     Activities of Daily Living In your present state of health, do you have any difficulty performing the following activities: 08/14/2017  Hearing? Y  Vision? N  Difficulty concentrating or making decisions? N  Walking or climbing stairs? N  Dressing or bathing? N  Doing errands, shopping? N  Preparing Food and eating ? N  Using the Toilet? N  In the past six months, have you accidently leaked urine? N  Comment urinary urgency in the mornings  Do you have problems with loss of bowel control? N  Managing your Medications? N  Managing your Finances? N  Housekeeping or managing your Housekeeping? N  Some recent data might be hidden    Patient Care Team: [name], DO as PCP - General (Geriatric Medicine) [name], MD as Consulting Physician (Dermatology) [name], MD as Consulting Physician (Cardiology) [name], MD as Consulting Physician (Dermatology)    Assessment:      Exercise Activities and Dietary recommendations Current Exercise Habits: Home exercise routine, Type of exercise: walking, Time (Minutes): 20, Frequency (Times/Week): 6, Weekly Exercise (Minutes/Week): 120, Intensity: Mild, Exercise limited by: None identified  Goals    . Maintain Lifestyle          Starting today pt will maintain  lifestyle.       Fall Risk Fall Risk  08/14/2017 09/20/2016 03/22/2016 09/22/2015 03/16/2015  Falls in the past year? No No No Yes No  Number falls in past yr: - - - 1 -  Comment - - - tripped over cord -  Injury with Fall? - - - No -   Depression Screen PHQ 2/9 Scores 08/14/2017 09/20/2016 03/22/2016 09/22/2015  PHQ - 2 Score 0 0 0 0     Cognitive Function MMSE - Mini Mental State Exam 08/14/2017 09/22/2015  Orientation to time 5 5  Orientation to Place 5 5  Registration 3 3  Attention/ Calculation 5 5  Recall 2 3  Language- name 2 objects 2 2  Language- repeat 1 1  Language- follow 3 step command 3 3  Language- read & follow direction 1 1  Write a sentence 1 1  Copy design 1 1  Total score  90 30        Immunization History  Administered Date(s) Administered  . Influenza Whole 08/06/2012  . Influenza-Unspecified 08/06/2013, 08/24/2014, 08/26/2015, 08/31/2016  . Pneumococcal Conjugate-13 09/22/2015  . Pneumococcal Polysaccharide-23 08/29/2006  . Td 10/07/2007  . Zoster 03/12/2008   Screening Tests Health Maintenance  Topic Date Due  . INFLUENZA VACCINE  06/06/2017  . TETANUS/TDAP  10/06/2017  . DEXA SCAN  Completed  . PNA vac Low Risk Adult  Completed      Plan:    I have personally reviewed and addressed the Medicare Annual Wellness questionnaire and have noted the following in the patient's chart:  A. Medical and social history B. Use of alcohol, tobacco or illicit drugs  C. Current medications and supplements D. Functional ability and status E.  Nutritional status F.  Physical activity G. Advance directives H. List of other physicians I.  Hospitalizations, surgeries, and ER visits in previous 12 months J.  [place] to include hearing, vision, cognitive, depression L. Referrals and appointments - none  In addition, I have reviewed and discussed with patient certain preventive protocols, quality metrics, and best practice recommendations. A  written personalized care plan for preventive services as well as general preventive health recommendations were provided to patient.  See attached scanned questionnaire for additional information.   Signed,   Rich Reining, RN Nurse Health Advisor   Quick Notes   Health Maintenance: Pt will get flu vaccine when [facility] gets it in stock.  Shingles vaccines sent to pharmacy. Referral to Audiology sent, pt c/o hearing loss    Abnormal Screen: MMSE 29/30. Passed clock drawing     Patient Concerns: None     Nurse Concerns: None

## 2017-08-30 DIAGNOSIS — Z23 Encounter for immunization: Secondary | ICD-10-CM | POA: Diagnosis not present

## 2017-09-19 ENCOUNTER — Non-Acute Institutional Stay: Payer: Medicare Other | Admitting: Internal Medicine

## 2017-09-19 ENCOUNTER — Encounter: Payer: Self-pay | Admitting: Internal Medicine

## 2017-09-19 VITALS — BP 120/80 | HR 81 | Temp 98.6°F | Wt 134.0 lb

## 2017-09-19 DIAGNOSIS — D6869 Other thrombophilia: Secondary | ICD-10-CM | POA: Diagnosis not present

## 2017-09-19 DIAGNOSIS — M8949 Other hypertrophic osteoarthropathy, multiple sites: Secondary | ICD-10-CM

## 2017-09-19 DIAGNOSIS — I482 Chronic atrial fibrillation: Secondary | ICD-10-CM | POA: Diagnosis not present

## 2017-09-19 DIAGNOSIS — I2510 Atherosclerotic heart disease of native coronary artery without angina pectoris: Secondary | ICD-10-CM

## 2017-09-19 DIAGNOSIS — M159 Polyosteoarthritis, unspecified: Secondary | ICD-10-CM

## 2017-09-19 DIAGNOSIS — M150 Primary generalized (osteo)arthritis: Secondary | ICD-10-CM

## 2017-09-19 DIAGNOSIS — I4891 Unspecified atrial fibrillation: Secondary | ICD-10-CM

## 2017-09-19 DIAGNOSIS — M65331 Trigger finger, right middle finger: Secondary | ICD-10-CM

## 2017-09-19 DIAGNOSIS — I4821 Permanent atrial fibrillation: Secondary | ICD-10-CM

## 2017-09-19 NOTE — Progress Notes (Signed)
Location:  Occupational psychologist of Service:  Clinic (12)  Provider: Juanjesus Pepperman L. Mariea Clonts, D.O., C.M.D.  Code Status: DNR Goals of Care:  Advanced Directives 08/14/2017  Does Patient Have a Medical Advance Directive? Yes  Type of Paramedic of [place];Living will  Does patient want to make changes to medical advance directive? No - Patient declined  Copy of [place] in Chart? Yes  Pre-existing out of facility DNR order (yellow form or pink MOST form) -     Chief Complaint  Patient presents with  . Medical Management of Chronic Issues    med mgt (not annual exam, pt has medicare)    HPI: Patient is a 83 y.o. female seen today for medical management of chronic diseases.    Has survived past [name]'s anniversary of his death.  Over Thanksgiving, she and family are going to scatter his ashes.  She thinks she is doing fine.  Daughter here and [name]'s sister.    She read about statins pros and cons for taking for a long time.  On zocor 20mg .  Thinks she's been on it for 5 years.  Has not had known side effects.  She has known CAD.  No issues with number of pills or swallowing pills.    Is on eliquis for her anticoagulation for afib.  Had two nosebleeds this year, more minor vs 4 the previous year with xarelto.  No difficulty with rate control  Discussed shingrix shot.  She is planning on getting this when [facility] has them in stock.   Got her flu shot in October.    Had terrible pain after screwing a pole apart from a flag she'd had outside.  It was tingling and painful one night to where she thought she'd have to go to the ED.  Put a heating pad on it and some tylenol.  That was the worst of it.  Lasted two weeks.  Now middle finger is getting stuck and when she opens her hand it pops open.  Once she washes her hands and does her routine, it loosens up.    Right hip soreness occurs off and on.  Skipped a party once b/c of a  feeling of weakness in the hip, but hasn't been that bad for many months.    Right shoulder and neck sometimes sore.  Sometimes goes to the left.  Has occasional back aches, but rest relieves it.  Rarely goes to bed and can't sleep b/c of it, but if takes tylenol, she's fine.  The next day she'll be fine.    Will eat dinner and sometimes has a lot of gas between dinner and bedtime.  Hasn't really changed her diet.    Past Medical History:  Diagnosis Date  . Actinic keratosis 05/25/2014  . Anxiety   . Atrial fibrillation ([place]) 09/21/2010  . Basal cell carcinoma 05/25/2014   Multiple removed by Dr. Danella Sensing in March 2015: right neck, left neck, scalp   . Bell's palsy 07/18/1979  . Cervicalgia 01/31/2012  . Closed fracture of lumbar vertebra without mention of spinal cord injury 07/17/2005  . Conjunctiva disorder 12/26/2010  . Coronary atherosclerosis of native coronary artery 07/17/1997  . Cramp of limb 08/16/2009  . Degeneration of lumbar or lumbosacral intervertebral disc 01/31/2012  . Disturbance of skin sensation 08/2009  . Dizziness and giddiness 11/08/2011   vertigo  . External hemorrhoids without mention of complication 6/75/9163  . External hemorrhoids without  mention of complication 32/95/1884  . Ganglion of tendon sheath 08/16/2009  . Leg cramp 10/27/2013   Most frequently the right leg.   . Long term (current) use of anticoagulants 09/2010  . Lumbago 07/2009  . Meralgia paresthetica 07/2007  . MI, old   . Myalgia and myositis, unspecified 01/17/2012  . Osteoporosis   . Other abnormal blood chemistry 04/08/2012  . Other abnormal blood chemistry 2013   hyperglycemia  . Other disorder of muscle, ligament, and fascia 04/08/2012  . Other specified cardiac dysrhythmias(427.89) 06/27/2010  . Pain in joint, ankle and foot 10/27/2013   Bilateral since 1998   . Pain in joint, shoulder region 08/12/2012  . Pain in joint, upper arm 12/26/2010  . Pain in limb 01/11/2011  . Pain in thoracic  spine 01/31/2012  . 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 PVC's (premature ventricular contractions)   . Rash and other nonspecific skin eruption 08/02/2011  . Senile osteoporosis 07/17/1993  . Unspecified essential hypertension 07/17/1997  . Varicose veins of lower extremities 08/16/2009  . Varicose veins of lower extremities 08/16/2009    Past Surgical History:  Procedure Laterality Date  . COLONOSCOPY WITH PROPOFOL N/A 10/19/2015   Procedure: COLONOSCOPY WITH PROPOFOL;  Surgeon: Gatha Mayer, MD;  Location: [facility];  Service: Endoscopy;  Laterality: N/A;  . CORONARY ARTERY BYPASS GRAFT  1998   x2; LIMA to LAD; SVG to diagonal off bypass  . HEMORRHOID SURGERY  08/26/2012   Dr. Brantley Stage  . MASS EXCISION Left 11/23/2015   Procedure: LEFT WRIST EXCISION CYST;  Surgeon: Leanora Cover, MD;  Location: [place];  Service: Orthopedics;  Laterality: Left;  . SKIN BIOPSY  01/29/14   (R) neck; (R) scalp, 2 (L) neck; shave biopsy Superficial basal cell carcinoma Dr. Danella Sensing  . TONSILLECTOMY  1940's    Allergies  Allergen Reactions  . Iodinated Diagnostic Agents Nausea Only and Other (See Comments)    Severe nausea and also passed out  . Bactrim Rash  . Relafen [Nabumetone] Rash  . Sulfamethoxazole-Trimethoprim Rash    Outpatient Encounter Medications as of 09/19/2017  Medication Sig  . atenolol (TENORMIN) 25 MG tablet Take 1 tablet (25 mg total) by mouth daily.  . calcium carbonate (OS-CAL) 600 MG TABS Take 600 mg by mouth 2 (two) times daily with a meal.   . cholecalciferol (VITAMIN D) 1000 UNITS tablet Take 1,000 Units by mouth daily. For Vitamin D supplement.  . digoxin (LANOXIN) 0.125 MG tablet Take 1 tablet (125 mcg total) by mouth every other day.  Marland Kitchen ELIQUIS 2.5 MG TABS tablet TAKE 1 TABLET TWICE A DAY  . famotidine (PEPCID) 20 MG tablet Take 1 tablet (20 mg total) by mouth 2 (two) times daily.  . hydrochlorothiazide (HYDRODIURIL) 25 MG tablet TAKE 1  TABLET ONCE DAILY  . LORazepam (ATIVAN) 1 MG tablet TAKE 1 TABLET AT BEDTIME TO DECREASE ANXIETY AND HELP WITH SLEEP  . simvastatin (ZOCOR) 20 MG tablet Take 1 tablet (20 mg total) by mouth daily at 6 PM.   No facility-administered encounter medications on file as of 09/19/2017.     Review of Systems:  Review of Systems  Constitutional: Negative for chills, fever and malaise/fatigue.  HENT: Positive for hearing loss. Negative for congestion.   Eyes: Negative for blurred vision.       Glasses  Respiratory: Negative for cough and shortness of breath.   Cardiovascular: Negative for chest pain, palpitations and leg swelling.  Genitourinary: Negative for dysuria.  Musculoskeletal: Positive for joint pain. Negative for falls.  Skin: Negative for itching and rash.  Neurological: Positive for tingling and sensory change. Negative for weakness.  Endo/Heme/Allergies: Bruises/bleeds easily.  Psychiatric/Behavioral: Negative for depression and memory loss. The patient is not nervous/anxious.     Health Maintenance  Topic Date Due  . TETANUS/TDAP  10/06/2017  . INFLUENZA VACCINE  Completed  . DEXA SCAN  Completed  . PNA vac Low Risk Adult  Completed    Physical Exam: Vitals:   09/19/17 1331  BP: 120/80  Pulse: 81  Temp: 98.6 F (37 C)  TempSrc: Oral  SpO2: 96%  Weight: 134 lb (60.8 kg)   Body mass index is 21.63 kg/m. Physical Exam  Constitutional: She is oriented to person, place, and time. She appears well-developed and well-nourished. No distress.  HENT:  Head: Normocephalic and atraumatic.  Right Ear: External ear normal.  Left Ear: External ear normal.  Mouth/Throat: Oropharynx is clear and moist.  Eyes:  glasses  Cardiovascular: Intact distal pulses.  irreg irreg  Pulmonary/Chest: Effort normal and breath sounds normal. No respiratory distress. She has no rales.  Abdominal: Soft. Bowel sounds are normal.  Musculoskeletal: Normal range of motion.  No active trigger  finger on exam  Neurological: She is alert and oriented to person, place, and time. No cranial nerve deficit.  Skin: Skin is warm and dry. There is pallor.  Chronic pallor  Psychiatric: She has a normal mood and affect.    Labs reviewed: Basic Metabolic Panel: Recent Labs    03/31/17 1520  NA 137  K 3.8  CL 100*  CO2 29  GLUCOSE 95  BUN 23*  CREATININE 1.12*  CALCIUM 9.7   Liver Function Tests: No results for input(s): AST, ALT, ALKPHOS, BILITOT, PROT, ALBUMIN in the last 8760 hours. No results for input(s): LIPASE, AMYLASE in the last 8760 hours. No results for input(s): AMMONIA in the last 8760 hours. CBC: Recent Labs    03/31/17 1520  WBC 6.3  HGB 13.1  HCT 40.6  MCV 88.8  PLT 126*   Lipid Panel: No results for input(s): CHOL, HDL, LDLCALC, TRIG, CHOLHDL, LDLDIRECT in the last 8760 hours. Lab Results  Component Value Date   HGBA1C 6.2 09/12/2016    Assessment/Plan 1. Atherosclerosis of native coronary artery of native heart without angina pectoris -stable, cont current regimen, not having trouble tolerating statin at her age so continue  2. Permanent atrial fibrillation (HCC) -cont atenolol, dig and eliquis, followed by cardiology  3. Hypercoagulable state due to atrial fibrillation (HCC) -cont eliquis for anticoagulation--on appropriate dose for age, wt, renal function  4. Primary osteoarthritis involving multiple joints -some more difficulty lately with shoulder and fingers, counseled to use topicals, regular exercise, tylenol, heat if needed  5. Trigger middle finger of right hand -if this becomes too bothersome or gets completely stuck, will refer to ortho for injection  Labs/tests ordered:  No new Next appt:  6 mos med mgt  Abdulhamid Olgin L. Tavonna Worthington, D.O. [facility] [address], [place] [address] Cell Phone (Mon-Fri 8am-5pm):  [phone] On Call:  [phone] & follow prompts after 5pm &  weekends Office Phone:  [phone] Office Fax:  [phone]

## 2017-10-26 ENCOUNTER — Encounter: Payer: Self-pay | Admitting: Podiatry

## 2017-10-26 ENCOUNTER — Ambulatory Visit (INDEPENDENT_AMBULATORY_CARE_PROVIDER_SITE_OTHER): Payer: Medicare Other | Admitting: Podiatry

## 2017-10-26 DIAGNOSIS — M65331 Trigger finger, right middle finger: Secondary | ICD-10-CM | POA: Insufficient documentation

## 2017-10-26 DIAGNOSIS — M19041 Primary osteoarthritis, right hand: Secondary | ICD-10-CM | POA: Diagnosis not present

## 2017-10-26 DIAGNOSIS — B351 Tinea unguium: Secondary | ICD-10-CM | POA: Diagnosis not present

## 2017-10-26 DIAGNOSIS — M79644 Pain in right finger(s): Secondary | ICD-10-CM | POA: Diagnosis not present

## 2017-10-26 DIAGNOSIS — D689 Coagulation defect, unspecified: Secondary | ICD-10-CM

## 2017-10-26 DIAGNOSIS — M79675 Pain in left toe(s): Secondary | ICD-10-CM

## 2017-11-01 ENCOUNTER — Ambulatory Visit (INDEPENDENT_AMBULATORY_CARE_PROVIDER_SITE_OTHER): Payer: Medicare Other | Admitting: Internal Medicine

## 2017-11-01 ENCOUNTER — Encounter: Payer: Self-pay | Admitting: Internal Medicine

## 2017-11-01 VITALS — BP 124/84 | HR 89 | Ht 66.0 in | Wt 131.4 lb

## 2017-11-01 DIAGNOSIS — I2510 Atherosclerotic heart disease of native coronary artery without angina pectoris: Secondary | ICD-10-CM | POA: Diagnosis not present

## 2017-11-01 DIAGNOSIS — I482 Chronic atrial fibrillation: Secondary | ICD-10-CM | POA: Diagnosis not present

## 2017-11-01 DIAGNOSIS — I10 Essential (primary) hypertension: Secondary | ICD-10-CM | POA: Diagnosis not present

## 2017-11-01 DIAGNOSIS — Z951 Presence of aortocoronary bypass graft: Secondary | ICD-10-CM | POA: Diagnosis not present

## 2017-11-01 DIAGNOSIS — I4821 Permanent atrial fibrillation: Secondary | ICD-10-CM

## 2017-11-01 NOTE — Progress Notes (Signed)
Subjective: 83 year old female who was on Eliquis presents the office today for concerns of recurrent thick left hallux toenail which is painful with pressure in shoes.  She was last seen in October 2017 for the nails and debrided and since then she has not had any problems in the last couple weeks and started to grow back.  She denies any redness or drainage or any swelling.  She has no other concerns. Denies any systemic complaints such as fevers, chills, nausea, vomiting. No acute changes since last appointment, and no other complaints at this time.   Objective: AAO x3, NAD DP/PT pulses palpable bilaterally, CRT less than 3 seconds Left hallux toenail is hypertrophic, dystrophic, discolored.  There is no surrounding redness or drainage but there is tenderness the nail.  There is incurvation of both the medial lateral aspect of the nail corner.  After debridement there is resolution of symptoms.  No clinical signs of infection noted. No open lesions or pre-ulcerative lesions.  No pain with calf compression, swelling, warmth, erythema  Assessment: Symptomatic onychomycosis/onychodystrophy left hallux toenail  Plan: -All treatment options discussed with the patient including all alternatives, risks, complications.  -Sharply debride the left hallux toenails without any complications or bleeding.  After debridement there was resolution of symptoms.  She has no further questions or concerns today. -Patient encouraged to call the office with any questions, concerns, change in symptoms.   Trula Slade DPM

## 2017-11-01 NOTE — Progress Notes (Signed)
HPI Susan Conway returns today for ongoing evaluation and management of atrial fibrillation.  Since I saw her last, she describes an episode of syncope where she transiently lost consciousness, with associated shortness of breath and chest pressure, lasting 10 or 15 seconds.  She did not experience palpitations.  She has had no additional episodes.  She did not lose control of bowel or bladder or bite her tongue.  She is never experienced another episode similar to this.  She does have a remote history of autonomic dysfunction but states that her symptoms were not at all related. Allergies  Allergen Reactions  . Iodinated Diagnostic Agents Nausea Only and Other (See Comments)    Severe nausea and also passed out  . Bactrim Rash  . Relafen [Nabumetone] Rash  . Sulfamethoxazole-Trimethoprim Rash     Current Outpatient Medications  Medication Sig Dispense Refill  . atenolol (TENORMIN) 25 MG tablet Take 1 tablet (25 mg total) by mouth daily. 90 tablet 3  . calcium carbonate (OS-CAL) 600 MG TABS Take 600 mg by mouth 2 (two) times daily with a meal.     . cholecalciferol (VITAMIN D) 1000 UNITS tablet Take 1,000 Units by mouth daily. For Vitamin D supplement.    . digoxin (LANOXIN) 0.125 MG tablet Take 1 tablet (125 mcg total) by mouth every other day. 45 tablet 2  . ELIQUIS 2.5 MG TABS tablet TAKE 1 TABLET TWICE A DAY 180 tablet 1  . famotidine (PEPCID) 20 MG tablet Take 1 tablet (20 mg total) by mouth 2 (two) times daily. (Patient taking differently: Take 20 mg by mouth daily as needed for heartburn or indigestion. ) 14 tablet 0  . hydrochlorothiazide (HYDRODIURIL) 25 MG tablet TAKE 1 TABLET ONCE DAILY 90 tablet 1  . LORazepam (ATIVAN) 1 MG tablet TAKE 1 TABLET AT BEDTIME TO DECREASE ANXIETY AND HELP WITH SLEEP 90 tablet 0  . simvastatin (ZOCOR) 20 MG tablet Take 1 tablet (20 mg total) by mouth daily at 6 PM. 90 tablet 3   No current facility-administered medications for this visit.       Past Medical History:  Diagnosis Date  . Actinic keratosis 05/25/2014  . Anxiety   . Atrial fibrillation ([place]) 09/21/2010  . Basal cell carcinoma 05/25/2014   Multiple removed by Dr. Danella Sensing in March 2015: right neck, left neck, scalp   . Bell's palsy 07/18/1979  . Cervicalgia 01/31/2012  . Closed fracture of lumbar vertebra without mention of spinal cord injury 07/17/2005  . Conjunctiva disorder 12/26/2010  . Coronary atherosclerosis of native coronary artery 07/17/1997  . Cramp of limb 08/16/2009  . Degeneration of lumbar or lumbosacral intervertebral disc 01/31/2012  . Disturbance of skin sensation 08/2009  . Dizziness and giddiness 11/08/2011   vertigo  . External hemorrhoids without mention of complication 1/54/0086  . External hemorrhoids without mention of complication 76/19/5093  . Ganglion of tendon sheath 08/16/2009  . Leg cramp 10/27/2013   Most frequently the right leg.   . Long term (current) use of anticoagulants 09/2010  . Lumbago 07/2009  . Meralgia paresthetica 07/2007  . MI, old   . Myalgia and myositis, unspecified 01/17/2012  . Osteoporosis   . Other abnormal blood chemistry 04/08/2012  . Other abnormal blood chemistry 2013   hyperglycemia  . Other disorder of muscle, ligament, and fascia 04/08/2012  . Other specified cardiac dysrhythmias(427.89) 06/27/2010  . Pain in joint, ankle and foot 10/27/2013   Bilateral since 1998   .  Pain in joint, shoulder region 08/12/2012  . Pain in joint, upper arm 12/26/2010  . Pain in limb 01/11/2011  . Pain in thoracic spine 01/31/2012  . 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 PVC's (premature ventricular contractions)   . Rash and other nonspecific skin eruption 08/02/2011  . Senile osteoporosis 07/17/1993  . Unspecified essential hypertension 07/17/1997  . Varicose veins of lower extremities 08/16/2009  . Varicose veins of lower extremities 08/16/2009    ROS:   All systems reviewed and negative except as noted in the  HPI.   Past Surgical History:  Procedure Laterality Date  . COLONOSCOPY WITH PROPOFOL N/A 10/19/2015   Procedure: COLONOSCOPY WITH PROPOFOL;  Surgeon: Gatha Mayer, MD;  Location: [facility];  Service: Endoscopy;  Laterality: N/A;  . CORONARY ARTERY BYPASS GRAFT  1998   x2; LIMA to LAD; SVG to diagonal off bypass  . HEMORRHOID SURGERY  08/26/2012   Dr. Brantley Stage  . MASS EXCISION Left 11/23/2015   Procedure: LEFT WRIST EXCISION CYST;  Surgeon: Leanora Cover, MD;  Location: [place];  Service: Orthopedics;  Laterality: Left;  . SKIN BIOPSY  01/29/14   (R) neck; (R) scalp, 2 (L) neck; shave biopsy Superficial basal cell carcinoma Dr. Danella Sensing  . TONSILLECTOMY  1940's     Family History  Problem Relation Age of Onset  . Heart attack Father 76  . Heart disease Father   . Hypertension Sister   . Heart disease Sister   . Heart disease Brother      Social History   Socioeconomic History  . Marital status: Widowed    Spouse name: Not on file  . Number of children: Not on file  . Years of education: Not on file  . Highest education level: Not on file  Social Needs  . Financial resource strain: Not on file  . Food insecurity - worry: Not on file  . Food insecurity - inability: Not on file  . Transportation needs - medical: Not on file  . Transportation needs - non-medical: Not on file  Occupational History  . Occupation: Retired Pharmacist, hospital  Tobacco Use  . Smoking status: Never Smoker  . Smokeless tobacco: Never Used  Substance and Sexual Activity  . Alcohol use: Yes    Comment: occasional wine  . Drug use: No  . Sexual activity: No  Other Topics Concern  . Not on file  Social History Narrative   Living at [facility] since 2010   Widowed 09/03/2016   Never smoked   Alcohol occasional wine   Exercise, house work    DNR , POA, Living Will     BP 124/84   Pulse 89   Ht 5\' 6"  (1.676 m)   Wt 131 lb 6.4 oz (59.6 kg)   BMI 21.21 kg/m   Physical  Exam:  Well appearing elderly woman, NAD HEENT: Unremarkable Neck: 6 cm JVD, no thyromegally Lymphatics:  No adenopathy Back:  No CVA tenderness Lungs:  Clear, with no wheezes, rales, or rhonchi HEART:  IRegular rate rhythm, no murmurs, no rubs, no clicks Abd:  soft, positive bowel sounds, no organomegally, no rebound, no guarding Ext:  2 plus pulses, no edema, no cyanosis, no clubbing Skin:  No rashes no nodules Neuro:  CN II through XII intact, motor grossly intact  EKG -atrial fibrillation with a controlled ventricular response and right bundle branch block  Assess/Plan: 1.  Atrial fibrillation -her ventricular rate appears to be reasonably well controlled.  She  will continue her current medications. 2.  Syncope -while she did not lose postural tone, I suspect she had transient complete heart block.  We have no verification of this however.  For now I recommend watchful waiting.  If she has another episode in the next 2-3 weeks, I would recommend she wear a 30-day heart monitor.  If she has another episode longer than that and from now then we would consider insertion of an implantable loop recorder. 3.  Coronary artery disease -she denies anginal symptoms.  She will continue her current medications. 4.  Coags -the patient has been bothered by epistaxis but has continued her oral anticoagulation.  She is not been anemic.  She will continue Eliquis.

## 2017-11-01 NOTE — Patient Instructions (Addendum)
Medication Instructions:  Your physician recommends that you continue on your current medications as directed. Please refer to the Current Medication list given to you today.  Labwork: None ordered.  Testing/Procedures: None ordered.  Follow-Up: Your physician wants you to follow-up in: one year with Dr. Lovena Le.   You will receive a reminder letter in the mail two months in advance. If you don't receive a letter, please call our office to schedule the follow-up appointment.  Any Other Special Instructions Will Be Listed Below (If Applicable).  If you have a dizzy spell in the next few weeks call and we will order an event monitor for you.  If you have a dizzy spell a few months from now, call our office to set up a loop implant.  If you need a refill on your cardiac medications before your next appointment, please call your pharmacy.

## 2017-11-04 ENCOUNTER — Other Ambulatory Visit: Payer: Self-pay | Admitting: Internal Medicine

## 2017-12-06 ENCOUNTER — Other Ambulatory Visit: Payer: Self-pay | Admitting: Internal Medicine

## 2017-12-06 NOTE — Telephone Encounter (Signed)
Ok to fill? Last filled 07/03/17 Database checked and verified

## 2017-12-21 DIAGNOSIS — L239 Allergic contact dermatitis, unspecified cause: Secondary | ICD-10-CM | POA: Diagnosis not present

## 2017-12-21 DIAGNOSIS — Z85828 Personal history of other malignant neoplasm of skin: Secondary | ICD-10-CM | POA: Diagnosis not present

## 2018-02-05 ENCOUNTER — Other Ambulatory Visit: Payer: Self-pay | Admitting: Internal Medicine

## 2018-02-05 NOTE — Telephone Encounter (Signed)
Age 84 years WT 59.6kg  11/01/2017 Saw Dr Lovena Le 11/01/2017 03/31/2017 SrCr 1.12 03/31/2017 Hgb 13.1 HCT 40.6 Refill done for Eliquis 2.5mg  q 12 hours as requested

## 2018-02-18 ENCOUNTER — Other Ambulatory Visit: Payer: Self-pay | Admitting: *Deleted

## 2018-02-18 MED ORDER — APIXABAN 2.5 MG PO TABS: 2.5000 mg | ORAL_TABLET | Freq: Two times a day (BID) | ORAL | 1 refills | Status: DC

## 2018-02-18 NOTE — Telephone Encounter (Signed)
Pt is a 84 yr old female who last saw Dr Lovena Le on 11/01/17 weight was 59.6Kg. Last noted SCr was 1.12 on 03/31/17. Will refill Eliquis 2.5mg  BID.

## 2018-03-06 DIAGNOSIS — H5203 Hypermetropia, bilateral: Secondary | ICD-10-CM | POA: Diagnosis not present

## 2018-03-06 DIAGNOSIS — H2513 Age-related nuclear cataract, bilateral: Secondary | ICD-10-CM | POA: Diagnosis not present

## 2018-03-20 ENCOUNTER — Encounter: Payer: Self-pay | Admitting: Internal Medicine

## 2018-03-20 ENCOUNTER — Non-Acute Institutional Stay: Payer: Medicare Other | Admitting: Internal Medicine

## 2018-03-20 VITALS — BP 118/78 | HR 89 | Temp 98.6°F | Ht 66.0 in | Wt 136.0 lb

## 2018-03-20 DIAGNOSIS — D6869 Other thrombophilia: Secondary | ICD-10-CM | POA: Diagnosis not present

## 2018-03-20 DIAGNOSIS — M150 Primary generalized (osteo)arthritis: Secondary | ICD-10-CM | POA: Diagnosis not present

## 2018-03-20 DIAGNOSIS — I2510 Atherosclerotic heart disease of native coronary artery without angina pectoris: Secondary | ICD-10-CM

## 2018-03-20 DIAGNOSIS — I4821 Permanent atrial fibrillation: Secondary | ICD-10-CM

## 2018-03-20 DIAGNOSIS — M159 Polyosteoarthritis, unspecified: Secondary | ICD-10-CM

## 2018-03-20 DIAGNOSIS — M65331 Trigger finger, right middle finger: Secondary | ICD-10-CM

## 2018-03-20 DIAGNOSIS — I482 Chronic atrial fibrillation: Secondary | ICD-10-CM | POA: Diagnosis not present

## 2018-03-20 DIAGNOSIS — I4891 Unspecified atrial fibrillation: Secondary | ICD-10-CM

## 2018-03-20 DIAGNOSIS — M8949 Other hypertrophic osteoarthropathy, multiple sites: Secondary | ICD-10-CM

## 2018-03-20 NOTE — Progress Notes (Signed)
Location:  Occupational psychologist of Service:  Clinic (12)  Provider: Ilina Xu L. Mariea Clonts, D.O., C.M.D.  Code Status: DNR Goals of Care:  Advanced Directives 03/20/2018  Does Patient Have a Medical Advance Directive? Yes  Type of Paramedic of [place];Out of facility DNR (pink MOST or yellow form)  Does patient want to make changes to medical advance directive? No - Patient declined  Copy of [place] in Chart? Yes  Pre-existing out of facility DNR order (yellow form or pink MOST form) Yellow form placed in chart (order not valid for inpatient use)     Chief Complaint  Patient presents with  . Medical Management of Chronic Issues    28mth follow-up    HPI: Patient is a 84 y.o. female seen today for medical management of chronic diseases.    She got hearing aids which is the main change since her last visit.  They are very high tech and rechargeable.  Saw Dr. Lovena Le in Dec in follow up for her afib.    Left foot has toenail fungus and nail gets very thick and Dr. Jacqualyn Posey dremels it.  She still has her right middle finger trigger finger.  It can be painful at times.  Dr. Fredna Dow suggested play doh to close her hand all the way which works better than the exercise balls.  Dry mouth:  Has tried biotene and [facility] brand.  Doesn't help much.  Still takes lorazepam 1/2 to sleep.  She's been having it since '98 but dry mouth only started in last year.  HCTZ also contributes.  Does drink water when she goes through the kitchen.  Sugar free lozenges were suggested today.    We discussed shingrix last fall.  She got put on the [facility] list.  She had not gotten a call by Christmas so she called them.  She did go and get one which she paid with her credit card, but medicare reimbursed it.  Now she's on the list for the second one.  First was February 12, 2018.  She did not have any side effects.   No chest pain, sob, no bleeding problems.  Has a  sore neck for the past couple of days.  Thinks she may have slept wrong.  She's not suggesting anything be done.  She continues to have leg cramps.  Has been on statins long term.  She did take CoQ10 but it not help her.  Discussed a spoon full of yellow mustard at bedtime.    She's walked 65 miles since march.  Admits she has not walked daily.    Past Medical History:  Diagnosis Date  . Actinic keratosis 05/25/2014  . Anxiety   . Atrial fibrillation ([place]) 09/21/2010  . Basal cell carcinoma 05/25/2014   Multiple removed by Dr. Danella Sensing in March 2015: right neck, left neck, scalp   . Bell's palsy 07/18/1979  . Cervicalgia 01/31/2012  . Closed fracture of lumbar vertebra without mention of spinal cord injury 07/17/2005  . Conjunctiva disorder 12/26/2010  . Coronary atherosclerosis of native coronary artery 07/17/1997  . Cramp of limb 08/16/2009  . Degeneration of lumbar or lumbosacral intervertebral disc 01/31/2012  . Disturbance of skin sensation 08/2009  . Dizziness and giddiness 11/08/2011   vertigo  . External hemorrhoids without mention of complication 04/21/736  . External hemorrhoids without mention of complication 10/62/6948  . Ganglion of tendon sheath 08/16/2009  . Leg cramp 10/27/2013   Most  frequently the right leg.   . Long term (current) use of anticoagulants 09/2010  . Lumbago 07/2009  . Meralgia paresthetica 07/2007  . MI, old   . Myalgia and myositis, unspecified 01/17/2012  . Osteoporosis   . Other abnormal blood chemistry 04/08/2012  . Other abnormal blood chemistry 2013   hyperglycemia  . Other disorder of muscle, ligament, and fascia 04/08/2012  . Other specified cardiac dysrhythmias(427.89) 06/27/2010  . Pain in joint, ankle and foot 10/27/2013   Bilateral since 1998   . Pain in joint, shoulder region 08/12/2012  . Pain in joint, upper arm 12/26/2010  . Pain in limb 01/11/2011  . Pain in thoracic spine 01/31/2012  . 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 PVC's  (premature ventricular contractions)   . Rash and other nonspecific skin eruption 08/02/2011  . Senile osteoporosis 07/17/1993  . Unspecified essential hypertension 07/17/1997  . Varicose veins of lower extremities 08/16/2009  . Varicose veins of lower extremities 08/16/2009    Past Surgical History:  Procedure Laterality Date  . COLONOSCOPY WITH PROPOFOL N/A 10/19/2015   Procedure: COLONOSCOPY WITH PROPOFOL;  Surgeon: Gatha Mayer, MD;  Location: [facility];  Service: Endoscopy;  Laterality: N/A;  . CORONARY ARTERY BYPASS GRAFT  1998   x2; LIMA to LAD; SVG to diagonal off bypass  . HEMORRHOID SURGERY  08/26/2012   Dr. Brantley Stage  . MASS EXCISION Left 11/23/2015   Procedure: LEFT WRIST EXCISION CYST;  Surgeon: Leanora Cover, MD;  Location: [place];  Service: Orthopedics;  Laterality: Left;  . SKIN BIOPSY  01/29/14   (R) neck; (R) scalp, 2 (L) neck; shave biopsy Superficial basal cell carcinoma Dr. Danella Sensing  . TONSILLECTOMY  1940's    Allergies  Allergen Reactions  . Iodinated Diagnostic Agents Nausea Only and Other (See Comments)    Severe nausea and also passed out  . Bactrim Rash  . Relafen [Nabumetone] Rash  . Sulfamethoxazole-Trimethoprim Rash    Outpatient Encounter Medications as of 03/20/2018  Medication Sig  . apixaban (ELIQUIS) 2.5 MG TABS tablet Take 1 tablet (2.5 mg total) by mouth 2 (two) times daily.  Marland Kitchen atenolol (TENORMIN) 25 MG tablet Take 1 tablet (25 mg total) by mouth daily.  . calcium carbonate (OS-CAL) 600 MG TABS Take 600 mg by mouth 2 (two) times daily with a meal.   . cholecalciferol (VITAMIN D) 1000 UNITS tablet Take 1,000 Units by mouth daily. For Vitamin D supplement.  . digoxin (LANOXIN) 0.125 MG tablet Take 1 tablet (125 mcg total) by mouth every other day.  . famotidine (PEPCID) 20 MG tablet Take 20 mg by mouth as needed for heartburn or indigestion.  . hydrochlorothiazide (HYDRODIURIL) 25 MG tablet TAKE 1 TABLET ONCE DAILY  .  LORazepam (ATIVAN) 1 MG tablet TAKE 1 TABLET AT BEDTIME TO DECREASE ANXIETY AND HELP WITH SLEEP  . simvastatin (ZOCOR) 20 MG tablet Take 1 tablet (20 mg total) by mouth daily at 6 PM.  . [DISCONTINUED] famotidine (PEPCID) 20 MG tablet Take 1 tablet (20 mg total) by mouth 2 (two) times daily. (Patient taking differently: Take 20 mg by mouth daily as needed for heartburn or indigestion. )   No facility-administered encounter medications on file as of 03/20/2018.     Review of Systems:  Review of Systems  Constitutional: Negative for chills, fever and malaise/fatigue.  HENT: Positive for hearing loss. Negative for congestion.        Dry mouth  Eyes: Negative for blurred vision.  Glasses  Respiratory: Negative for cough and shortness of breath.   Cardiovascular: Negative for chest pain, palpitations and leg swelling.  Gastrointestinal: Negative for abdominal pain.  Genitourinary: Negative for dysuria.  Musculoskeletal: Positive for joint pain. Negative for falls.       Trigger finger  Skin: Negative for itching and rash.  Neurological: Negative for dizziness and loss of consciousness.  Endo/Heme/Allergies: Does not bruise/bleed easily.  Psychiatric/Behavioral: Negative for depression and memory loss. The patient is not nervous/anxious and does not have insomnia.     Health Maintenance  Topic Date Due  . TETANUS/TDAP  10/06/2017  . INFLUENZA VACCINE  06/06/2018  . DEXA SCAN  Completed  . PNA vac Low Risk Adult  Completed    Physical Exam: Vitals:   03/20/18 1356  BP: 118/78  Pulse: 89  Temp: 98.6 F (37 C)  TempSrc: Oral  SpO2: 98%  Weight: 136 lb (61.7 kg)  Height: 5\' 6"  (1.676 m)   Body mass index is 21.95 kg/m. Physical Exam  Constitutional: She is oriented to person, place, and time. She appears well-developed and well-nourished. No distress.  HENT:  Head: Normocephalic and atraumatic.  Right Ear: External ear normal.  Left Ear: External ear normal.  Nose:  Nose normal.  Mouth/Throat: Oropharynx is clear and moist.  Hearing aids  Cardiovascular:  irreg irreg  Pulmonary/Chest: Effort normal and breath sounds normal. No respiratory distress.  Abdominal: Bowel sounds are normal.  Musculoskeletal: Normal range of motion.  Trigger finger   Neurological: She is alert and oriented to person, place, and time. No cranial nerve deficit.  Skin: Skin is warm and dry. Capillary refill takes less than 2 seconds.  Psychiatric: She has a normal mood and affect.    Labs reviewed: Basic Metabolic Panel: Recent Labs    03/31/17 1520  NA 137  K 3.8  CL 100*  CO2 29  GLUCOSE 95  BUN 23*  CREATININE 1.12*  CALCIUM 9.7   Liver Function Tests: No results for input(s): AST, ALT, ALKPHOS, BILITOT, PROT, ALBUMIN in the last 8760 hours. No results for input(s): LIPASE, AMYLASE in the last 8760 hours. No results for input(s): AMMONIA in the last 8760 hours. CBC: Recent Labs    03/31/17 1520  WBC 6.3  HGB 13.1  HCT 40.6  MCV 88.8  PLT 126*   Lipid Panel: No results for input(s): CHOL, HDL, LDLCALC, TRIG, CHOLHDL, LDLDIRECT in the last 8760 hours. Lab Results  Component Value Date   HGBA1C 6.2 09/12/2016   Lab Results  Component Value Date   DIGOXIN <0.5 (L) 05/11/2016    Assessment/Plan 1. Atherosclerosis of native coronary artery of native heart without angina pectoris -stable, asymptomatic, cont MI prevention, walking, exercise, healthy diet   2. Permanent atrial fibrillation (HCC) -ongoing, stable, asymptomatic, cont anticoagulation and rate control with atenolol and dig as per cardiology  3. Hypercoagulable state due to atrial fibrillation (HCC) -cont eliquis, h/h stable  4. Trigger middle finger of right hand -ongoing, f/u Dr. Fredna Dow if becomes bothersome enough  5. Primary osteoarthritis involving multiple joints -ongoing, cont tylenol, topicals, exercise  Labs/tests ordered: No orders of the defined types were placed in this  encounter.  Next appt:  6 mos for EV   Clover Feehan L. Lorali Khamis, D.O. [facility] [address], [place] [address] Cell Phone (Mon-Fri 8am-5pm):  [phone] On Call:  [phone] & follow prompts after 5pm & weekends Office Phone:  [phone] Office Fax:  [phone]    

## 2018-03-20 NOTE — Patient Instructions (Addendum)
Try some sugar-free lozenges for your dry mouth. Continue your walking!  I'm impressed with your spunk.   Call the pharmacy if you do not hear from them by July for your second shingles vaccine. Enjoy your summer and be sure to hydrate well.

## 2018-04-10 ENCOUNTER — Telehealth: Payer: Self-pay | Admitting: *Deleted

## 2018-04-10 NOTE — Telephone Encounter (Signed)
Patient would be interested in therapy here at [facility].

## 2018-04-10 NOTE — Telephone Encounter (Signed)
Patient called and stated that she would like an appointment with Dr. Mariea Clonts today. Schedule full.  Stated that her Right Neck, Shoulder and Arm hurts. Persistent now for 2 weeks with no relief. No Chest Pain, No SOB. Has been using Heat and Ice with no relief. Wants to see Dr. Mariea Clonts.  I offered an appointment here at our office but patient stated that she could not come to the office. Patient would like to be worked in today at [employer] to return call. Please Advise.

## 2018-04-10 NOTE — Telephone Encounter (Signed)
Unfortunately, I have a meeting at the end of the day so I'm unable to work anyone in.  Perhaps, she would benefit from working with therapy for her neck (OT stretches and therapeutic modalities).

## 2018-04-16 ENCOUNTER — Other Ambulatory Visit: Payer: Self-pay | Admitting: Internal Medicine

## 2018-04-19 ENCOUNTER — Telehealth: Payer: Self-pay | Admitting: Internal Medicine

## 2018-04-19 NOTE — Telephone Encounter (Signed)
Call placed to Pt. Per review of Pt chart, Dr.Taylor had advised loop placement if Pt had a spell longer than 2-3 weeks from last office visit.  It has been 6 months since Pt's last spell.  Pt would like to make appt with Dr. Lovena Le to discuss loop placement. Will schedule for end of June/early July.  Pt thankful for call back.

## 2018-04-19 NOTE — Telephone Encounter (Signed)
NEW MESSAGE   Pt c/o Syncope: STAT if syncope occurred within 30 minutes and pt complains of lightheadedness High Priority if episode of passing out, completely, today or in last 24 hours   1. Did you pass out today? no  2. When is the last time you passed out? 10/2017  3. Has this occurred multiple times? no  4. Did you have any symptoms prior to passing out? Patient states while sitting yesterday, she started to feel odd. She felt faint

## 2018-04-22 ENCOUNTER — Other Ambulatory Visit: Payer: Self-pay | Admitting: Internal Medicine

## 2018-05-03 ENCOUNTER — Ambulatory Visit (INDEPENDENT_AMBULATORY_CARE_PROVIDER_SITE_OTHER): Payer: Medicare Other | Admitting: Internal Medicine

## 2018-05-03 ENCOUNTER — Encounter: Payer: Self-pay | Admitting: Internal Medicine

## 2018-05-03 VITALS — BP 128/80 | HR 86 | Ht 66.0 in | Wt 134.0 lb

## 2018-05-03 DIAGNOSIS — I10 Essential (primary) hypertension: Secondary | ICD-10-CM | POA: Diagnosis not present

## 2018-05-03 DIAGNOSIS — Z951 Presence of aortocoronary bypass graft: Secondary | ICD-10-CM | POA: Diagnosis not present

## 2018-05-03 DIAGNOSIS — I4821 Permanent atrial fibrillation: Secondary | ICD-10-CM

## 2018-05-03 DIAGNOSIS — I482 Chronic atrial fibrillation: Secondary | ICD-10-CM | POA: Diagnosis not present

## 2018-05-03 DIAGNOSIS — I2510 Atherosclerotic heart disease of native coronary artery without angina pectoris: Secondary | ICD-10-CM | POA: Diagnosis not present

## 2018-05-03 DIAGNOSIS — R55 Syncope and collapse: Secondary | ICD-10-CM | POA: Diagnosis not present

## 2018-05-03 NOTE — Progress Notes (Signed)
HPI Susan Conway returns today for evaluation of syncope and atrial fib. When I saw her last she was c/o an isolated episode of syncope. She did well until a few weeks ago when she had another episode. The patient denies chest pain or sob. She is active. She did not lose control of bowel or bladder function. No edema.  Allergies  Allergen Reactions  . Iodinated Diagnostic Agents Nausea Only and Other (See Comments)    Severe nausea and also passed out  . Bactrim Rash  . Relafen [Nabumetone] Rash  . Sulfamethoxazole-Trimethoprim Rash     Current Outpatient Medications  Medication Sig Dispense Refill  . apixaban (ELIQUIS) 2.5 MG TABS tablet Take 1 tablet (2.5 mg total) by mouth 2 (two) times daily. 180 tablet 1  . atenolol (TENORMIN) 25 MG tablet Take 1 tablet (25 mg total) by mouth daily. 90 tablet 3  . calcium carbonate (OS-CAL) 600 MG TABS Take 600 mg by mouth 2 (two) times daily with a meal.     . cholecalciferol (VITAMIN D) 1000 UNITS tablet Take 1,000 Units by mouth daily. For Vitamin D supplement.    . digoxin (LANOXIN) 0.125 MG tablet TAKE 1 TABLET EVERY OTHER  DAY 45 tablet 1  . famotidine (PEPCID) 20 MG tablet Take 20 mg by mouth as needed for heartburn or indigestion.    . hydrochlorothiazide (HYDRODIURIL) 25 MG tablet TAKE 1 TABLET ONCE DAILY 90 tablet 1  . LORazepam (ATIVAN) 1 MG tablet TAKE 1 TABLET AT BEDTIME TO DECREASE ANXIETY AND HELP WITH SLEEP 90 tablet 0  . simvastatin (ZOCOR) 20 MG tablet Take 1 tablet (20 mg total) by mouth daily at 6 PM. 90 tablet 3   No current facility-administered medications for this visit.      Past Medical History:  Diagnosis Date  . Actinic keratosis 05/25/2014  . Anxiety   . Atrial fibrillation ([place]) 09/21/2010  . Basal cell carcinoma 05/25/2014   Multiple removed by Dr. Danella Sensing in March 2015: right neck, left neck, scalp   . Bell's palsy 07/18/1979  . Cervicalgia 01/31/2012  . Closed fracture of lumbar vertebra without  mention of spinal cord injury 07/17/2005  . Conjunctiva disorder 12/26/2010  . Coronary atherosclerosis of native coronary artery 07/17/1997  . Cramp of limb 08/16/2009  . Degeneration of lumbar or lumbosacral intervertebral disc 01/31/2012  . Disturbance of skin sensation 08/2009  . Dizziness and giddiness 11/08/2011   vertigo  . External hemorrhoids without mention of complication 1/57/2620  . External hemorrhoids without mention of complication 35/59/7416  . Ganglion of tendon sheath 08/16/2009  . Leg cramp 10/27/2013   Most frequently the right leg.   . Long term (current) use of anticoagulants 09/2010  . Lumbago 07/2009  . Meralgia paresthetica 07/2007  . MI, old   . Myalgia and myositis, unspecified 01/17/2012  . Osteoporosis   . Other abnormal blood chemistry 04/08/2012  . Other abnormal blood chemistry 2013   hyperglycemia  . Other disorder of muscle, ligament, and fascia 04/08/2012  . Other specified cardiac dysrhythmias(427.89) 06/27/2010  . Pain in joint, ankle and foot 10/27/2013   Bilateral since 1998   . Pain in joint, shoulder region 08/12/2012  . Pain in joint, upper arm 12/26/2010  . Pain in limb 01/11/2011  . Pain in thoracic spine 01/31/2012  . 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 PVC's (premature ventricular contractions)   . Rash and other nonspecific skin eruption 08/02/2011  . Senile osteoporosis  07/17/1993  . Unspecified essential hypertension 07/17/1997  . Varicose veins of lower extremities 08/16/2009  . Varicose veins of lower extremities 08/16/2009    ROS:   All systems reviewed and negative except as noted in the HPI.   Past Surgical History:  Procedure Laterality Date  . COLONOSCOPY WITH PROPOFOL N/A 10/19/2015   Procedure: COLONOSCOPY WITH PROPOFOL;  Surgeon: Gatha Mayer, MD;  Location: [facility];  Service: Endoscopy;  Laterality: N/A;  . CORONARY ARTERY BYPASS GRAFT  1998   x2; LIMA to LAD; SVG to diagonal off bypass  . HEMORRHOID SURGERY   08/26/2012   Dr. Brantley Stage  . MASS EXCISION Left 11/23/2015   Procedure: LEFT WRIST EXCISION CYST;  Surgeon: Leanora Cover, MD;  Location: [place];  Service: Orthopedics;  Laterality: Left;  . SKIN BIOPSY  01/29/14   (R) neck; (R) scalp, 2 (L) neck; shave biopsy Superficial basal cell carcinoma Dr. Danella Sensing  . TONSILLECTOMY  1940's     Family History  Problem Relation Age of Onset  . Heart attack Father 42  . Heart disease Father   . Hypertension Sister   . Heart disease Sister   . Heart disease Brother      Social History   Socioeconomic History  . Marital status: Widowed    Spouse name: Not on file  . Number of children: Not on file  . Years of education: Not on file  . Highest education level: Not on file  Occupational History  . Occupation: Retired Tour manager  . Financial resource strain: Not on file  . Food insecurity:    Worry: Not on file    Inability: Not on file  . Transportation needs:    Medical: Not on file    Non-medical: Not on file  Tobacco Use  . Smoking status: Never Smoker  . Smokeless tobacco: Never Used  Substance and Sexual Activity  . Alcohol use: Yes    Comment: occasional wine  . Drug use: No  . Sexual activity: Never  Lifestyle  . Physical activity:    Days per week: Not on file    Minutes per session: Not on file  . Stress: Not on file  Relationships  . Social connections:    Talks on phone: Not on file    Gets together: Not on file    Attends religious service: Not on file    Active member of club or organization: Not on file    Attends meetings of clubs or organizations: Not on file    Relationship status: Not on file  . Intimate partner violence:    Fear of current or ex partner: Not on file    Emotionally abused: Not on file    Physically abused: Not on file    Forced sexual activity: Not on file  Other Topics Concern  . Not on file  Social History Narrative   Living at [facility] since 2010    Widowed 09/03/2016   Never smoked   Alcohol occasional wine   Exercise, house work    DNR , POA, Living Will     BP 128/80   Pulse 86   Ht 5\' 6"  (1.676 m)   Wt 134 lb (60.8 kg)   SpO2 95%   BMI 21.63 kg/m   Physical Exam:  Well appearing NAD HEENT: Unremarkable Neck:  No JVD, no thyromegally Lymphatics:  No adenopathy Back:  No CVA tenderness Lungs:  Clear with no wheezes HEART:  IRegular rate rhythm, no murmurs, no rubs, no clicks Abd:  soft, positive bowel sounds, no organomegally, no rebound, no guarding Ext:  2 plus pulses, no edema, no cyanosis, no clubbing Skin:  No rashes no nodules Neuro:  CN II through XII intact, motor grossly intact  EKG - atrial fib with RBBB  Assess/Plan: 1. Atrial fib - her rates are well controlled. She will continue her current meds. 2. Syncope - I am concerned about her AV conduction and have recommended she undergo insertion of an ILR. I have asked her not to drive. 3. CAD - she denies anginal symptoms. We will follow.  Mikle Bosworth.D.

## 2018-05-03 NOTE — Patient Instructions (Addendum)
Medication Instructions:  Your physician recommends that you continue on your current medications as directed. Please refer to the Current Medication list given to you today.  Labwork: None ordered.  Testing/Procedures: None ordered.  Follow-Up: You will follow up with the device clinic in 7-10 days for a wound check.  You will follow up with Dr. Lovena Le 91 days after your procedure.  Any Other Special Instructions Will Be Listed Below (If Applicable).  Please arrive at the [facility] main entrance of [place] [facility] at:  10:30 am on May 13, 2018 You may eat or drink after midnight prior to procedure You may take any medications the morning of the procedure You will be discharged after your procedure. You may drive yourself home.  If you need a refill on your cardiac medications before your next appointment, please call your pharmacy.    Implantable Loop Recorder Placement An implantable loop recorder is a small electronic device that is placed under the skin of your chest. It is about the size of an AA ("double A") battery. The device records the electrical activity of your heart over a long period of time. Your health care provider can download these recordings to monitor your heart. You may need an implantable loop recorder if you have periods of abnormal heart activity (arrhythmias) or unexplained fainting (syncope) caused by a heart problem. Tell a health care provider about:  Any allergies you have.  All medicines you are taking, including vitamins, herbs, eye drops, creams, and over-the-counter medicines.  Any problems you or family members have had with anesthetic medicines.  Any blood disorders you have.  Any surgeries you have had.  Any medical conditions you have.  Whether you are pregnant or may be pregnant. What are the risks? Generally, this is a safe procedure. However, as with any procedure, problems may occur,  including:  Infection.  Bleeding.  Allergic reactions to anesthetic medicines.  Damage to nerves or blood vessels.  Failure of the device to work. This could require another surgery to replace it.  What happens before the procedure?   You may have a physical exam, blood tests, and imaging tests of your heart, such as a chest X-ray.  Follow instructions from your health care provider about eating or drinking restrictions.  Ask your health care provider about: ? Changing or stopping your regular medicines. This is especially important if you are taking diabetes medicines or blood thinners. ? Taking medicines such as aspirin and ibuprofen. These medicines can thin your blood. Do not take these medicines before your procedure if your surgeon instructs you not to.  Ask your health care provider how your surgical site will be marked or identified.  You may be given antibiotic medicine to help prevent infection.  Plan to have someone take you home after the procedure.  If you will be going home right after the procedure, plan to have someone with you for 24 hours.  Do not use any tobacco products, such as cigarettes, chewing tobacco, and e-cigarettes as told by your surgeon. If you need help quitting, ask your health care provider. What happens during the procedure?  To reduce your risk of infection: ? Your health care team will wash or sanitize their hands. ? Your skin will be washed with soap.  An IV tube will be inserted into one of your veins.  You may be given an antibiotic medicine through the IV tube.  You may be given one or more of the following: ?  A medicine to help you relax (sedative). ? A medicine to numb the area (local anesthetic).  A small cut (incision) will be made on the left side of your upper chest.  A pocket will be created under your skin.  The device will be placed in the pocket.  The incision will be closed with stitches (sutures) or adhesive  strips.  A bandage (dressing) will be placed over the incision. The procedure may vary among health care providers and hospitals. What happens after the procedure?  Your blood pressure, heart rate, breathing rate, and blood oxygen level will be monitored often until the medicines you were given have worn off.  You may be able to go home on the day of your surgery. Before going home: ? Your health care provider will program your recorder. ? You will learn how to trigger your device with a handheld activator. ? You will learn how to send recordings to your health care provider. ? You will get an ID card for your device, and you will be told when to use it.  Do not drive for 24 hours if you received a sedative. This information is not intended to replace advice given to you by your health care provider. Make sure you discuss any questions you have with your health care provider. Document Released: 10/04/2015 Document Revised: 03/30/2016 Document Reviewed: 07/28/2015 Elsevier Interactive Patient Education  [employer].

## 2018-05-03 NOTE — H&P (View-Only) (Signed)
HPI Susan Conway returns today for evaluation of syncope and atrial fib. When I saw her last she was c/o an isolated episode of syncope. She did well until a few weeks ago when she had another episode. The patient denies chest pain or sob. She is active. She did not lose control of bowel or bladder function. No edema.  Allergies  Allergen Reactions  . Iodinated Diagnostic Agents Nausea Only and Other (See Comments)    Severe nausea and also passed out  . Bactrim Rash  . Relafen [Nabumetone] Rash  . Sulfamethoxazole-Trimethoprim Rash     Current Outpatient Medications  Medication Sig Dispense Refill  . apixaban (ELIQUIS) 2.5 MG TABS tablet Take 1 tablet (2.5 mg total) by mouth 2 (two) times daily. 180 tablet 1  . atenolol (TENORMIN) 25 MG tablet Take 1 tablet (25 mg total) by mouth daily. 90 tablet 3  . calcium carbonate (OS-CAL) 600 MG TABS Take 600 mg by mouth 2 (two) times daily with a meal.     . cholecalciferol (VITAMIN D) 1000 UNITS tablet Take 1,000 Units by mouth daily. For Vitamin D supplement.    . digoxin (LANOXIN) 0.125 MG tablet TAKE 1 TABLET EVERY OTHER  DAY 45 tablet 1  . famotidine (PEPCID) 20 MG tablet Take 20 mg by mouth as needed for heartburn or indigestion.    . hydrochlorothiazide (HYDRODIURIL) 25 MG tablet TAKE 1 TABLET ONCE DAILY 90 tablet 1  . LORazepam (ATIVAN) 1 MG tablet TAKE 1 TABLET AT BEDTIME TO DECREASE ANXIETY AND HELP WITH SLEEP 90 tablet 0  . simvastatin (ZOCOR) 20 MG tablet Take 1 tablet (20 mg total) by mouth daily at 6 PM. 90 tablet 3   No current facility-administered medications for this visit.      Past Medical History:  Diagnosis Date  . Actinic keratosis 05/25/2014  . Anxiety   . Atrial fibrillation ([place]) 09/21/2010  . Basal cell carcinoma 05/25/2014   Multiple removed by Dr. Danella Sensing in March 2015: right neck, left neck, scalp   . Bell's palsy 07/18/1979  . Cervicalgia 01/31/2012  . Closed fracture of lumbar vertebra without  mention of spinal cord injury 07/17/2005  . Conjunctiva disorder 12/26/2010  . Coronary atherosclerosis of native coronary artery 07/17/1997  . Cramp of limb 08/16/2009  . Degeneration of lumbar or lumbosacral intervertebral disc 01/31/2012  . Disturbance of skin sensation 08/2009  . Dizziness and giddiness 11/08/2011   vertigo  . External hemorrhoids without mention of complication 4/82/5003  . External hemorrhoids without mention of complication 70/48/8891  . Ganglion of tendon sheath 08/16/2009  . Leg cramp 10/27/2013   Most frequently the right leg.   . Long term (current) use of anticoagulants 09/2010  . Lumbago 07/2009  . Meralgia paresthetica 07/2007  . MI, old   . Myalgia and myositis, unspecified 01/17/2012  . Osteoporosis   . Other abnormal blood chemistry 04/08/2012  . Other abnormal blood chemistry 2013   hyperglycemia  . Other disorder of muscle, ligament, and fascia 04/08/2012  . Other specified cardiac dysrhythmias(427.89) 06/27/2010  . Pain in joint, ankle and foot 10/27/2013   Bilateral since 1998   . Pain in joint, shoulder region 08/12/2012  . Pain in joint, upper arm 12/26/2010  . Pain in limb 01/11/2011  . Pain in thoracic spine 01/31/2012  . 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 PVC's (premature ventricular contractions)   . Rash and other nonspecific skin eruption 08/02/2011  . Senile osteoporosis  07/17/1993  . Unspecified essential hypertension 07/17/1997  . Varicose veins of lower extremities 08/16/2009  . Varicose veins of lower extremities 08/16/2009    ROS:   All systems reviewed and negative except as noted in the HPI.   Past Surgical History:  Procedure Laterality Date  . COLONOSCOPY WITH PROPOFOL N/A 10/19/2015   Procedure: COLONOSCOPY WITH PROPOFOL;  Surgeon: Gatha Mayer, MD;  Location: [facility];  Service: Endoscopy;  Laterality: N/A;  . CORONARY ARTERY BYPASS GRAFT  1998   x2; LIMA to LAD; SVG to diagonal off bypass  . HEMORRHOID SURGERY   08/26/2012   Dr. Brantley Stage  . MASS EXCISION Left 11/23/2015   Procedure: LEFT WRIST EXCISION CYST;  Surgeon: Leanora Cover, MD;  Location: [place];  Service: Orthopedics;  Laterality: Left;  . SKIN BIOPSY  01/29/14   (R) neck; (R) scalp, 2 (L) neck; shave biopsy Superficial basal cell carcinoma Dr. Danella Sensing  . TONSILLECTOMY  1940's     Family History  Problem Relation Age of Onset  . Heart attack Father 56  . Heart disease Father   . Hypertension Sister   . Heart disease Sister   . Heart disease Brother      Social History   Socioeconomic History  . Marital status: Widowed    Spouse name: Not on file  . Number of children: Not on file  . Years of education: Not on file  . Highest education level: Not on file  Occupational History  . Occupation: Retired Tour manager  . Financial resource strain: Not on file  . Food insecurity:    Worry: Not on file    Inability: Not on file  . Transportation needs:    Medical: Not on file    Non-medical: Not on file  Tobacco Use  . Smoking status: Never Smoker  . Smokeless tobacco: Never Used  Substance and Sexual Activity  . Alcohol use: Yes    Comment: occasional wine  . Drug use: No  . Sexual activity: Never  Lifestyle  . Physical activity:    Days per week: Not on file    Minutes per session: Not on file  . Stress: Not on file  Relationships  . Social connections:    Talks on phone: Not on file    Gets together: Not on file    Attends religious service: Not on file    Active member of club or organization: Not on file    Attends meetings of clubs or organizations: Not on file    Relationship status: Not on file  . Intimate partner violence:    Fear of current or ex partner: Not on file    Emotionally abused: Not on file    Physically abused: Not on file    Forced sexual activity: Not on file  Other Topics Concern  . Not on file  Social History Narrative   Living at [facility] since 2010    Widowed 09/03/2016   Never smoked   Alcohol occasional wine   Exercise, house work    DNR , POA, Living Will     BP 128/80   Pulse 86   Ht 5\' 6"  (1.676 m)   Wt 134 lb (60.8 kg)   SpO2 95%   BMI 21.63 kg/m   Physical Exam:  Well appearing NAD HEENT: Unremarkable Neck:  No JVD, no thyromegally Lymphatics:  No adenopathy Back:  No CVA tenderness Lungs:  Clear with no wheezes HEART:  IRegular rate rhythm, no murmurs, no rubs, no clicks Abd:  soft, positive bowel sounds, no organomegally, no rebound, no guarding Ext:  2 plus pulses, no edema, no cyanosis, no clubbing Skin:  No rashes no nodules Neuro:  CN II through XII intact, motor grossly intact  EKG - atrial fib with RBBB  Assess/Plan: 1. Atrial fib - her rates are well controlled. She will continue her current meds. 2. Syncope - I am concerned about her AV conduction and have recommended she undergo insertion of an ILR. I have asked her not to drive. 3. CAD - she denies anginal symptoms. We will follow.  Mikle Bosworth.D.

## 2018-05-13 ENCOUNTER — Encounter (HOSPITAL_COMMUNITY)
Admission: RE | Disposition: A | Discharge status: Home / Self Care | Payer: Self-pay | Source: Ambulatory Visit | Attending: Internal Medicine

## 2018-05-13 ENCOUNTER — Ambulatory Visit (HOSPITAL_COMMUNITY)
Admission: RE | Admit: 2018-05-13 | Discharge: 2018-05-13 | Disposition: A | Discharge status: Home / Self Care | Payer: Medicare Other | Source: Ambulatory Visit | Attending: Internal Medicine | Admitting: Internal Medicine

## 2018-05-13 ENCOUNTER — Encounter (HOSPITAL_COMMUNITY): Payer: Self-pay | Admitting: Internal Medicine

## 2018-05-13 DIAGNOSIS — I252 Old myocardial infarction: Secondary | ICD-10-CM | POA: Insufficient documentation

## 2018-05-13 DIAGNOSIS — M791 Myalgia, unspecified site: Secondary | ICD-10-CM | POA: Insufficient documentation

## 2018-05-13 DIAGNOSIS — Z8249 Family history of ischemic heart disease and other diseases of the circulatory system: Secondary | ICD-10-CM | POA: Diagnosis not present

## 2018-05-13 DIAGNOSIS — F419 Anxiety disorder, unspecified: Secondary | ICD-10-CM | POA: Insufficient documentation

## 2018-05-13 DIAGNOSIS — Z91041 Radiographic dye allergy status: Secondary | ICD-10-CM | POA: Diagnosis not present

## 2018-05-13 DIAGNOSIS — Z882 Allergy status to sulfonamides status: Secondary | ICD-10-CM | POA: Diagnosis not present

## 2018-05-13 DIAGNOSIS — I2510 Atherosclerotic heart disease of native coronary artery without angina pectoris: Secondary | ICD-10-CM | POA: Insufficient documentation

## 2018-05-13 DIAGNOSIS — Z7901 Long term (current) use of anticoagulants: Secondary | ICD-10-CM | POA: Diagnosis not present

## 2018-05-13 DIAGNOSIS — I10 Essential (primary) hypertension: Secondary | ICD-10-CM | POA: Insufficient documentation

## 2018-05-13 DIAGNOSIS — G510 Bell's palsy: Secondary | ICD-10-CM | POA: Diagnosis not present

## 2018-05-13 DIAGNOSIS — Z888 Allergy status to other drugs, medicaments and biological substances status: Secondary | ICD-10-CM | POA: Insufficient documentation

## 2018-05-13 DIAGNOSIS — Z951 Presence of aortocoronary bypass graft: Secondary | ICD-10-CM | POA: Insufficient documentation

## 2018-05-13 DIAGNOSIS — Z66 Do not resuscitate: Secondary | ICD-10-CM | POA: Insufficient documentation

## 2018-05-13 DIAGNOSIS — Z85828 Personal history of other malignant neoplasm of skin: Secondary | ICD-10-CM | POA: Diagnosis not present

## 2018-05-13 DIAGNOSIS — Z79899 Other long term (current) drug therapy: Secondary | ICD-10-CM | POA: Diagnosis not present

## 2018-05-13 DIAGNOSIS — M810 Age-related osteoporosis without current pathological fracture: Secondary | ICD-10-CM | POA: Insufficient documentation

## 2018-05-13 DIAGNOSIS — R55 Syncope and collapse: Secondary | ICD-10-CM | POA: Insufficient documentation

## 2018-05-13 DIAGNOSIS — Z881 Allergy status to other antibiotic agents status: Secondary | ICD-10-CM | POA: Diagnosis not present

## 2018-05-13 DIAGNOSIS — Z9889 Other specified postprocedural states: Secondary | ICD-10-CM | POA: Insufficient documentation

## 2018-05-13 DIAGNOSIS — I4891 Unspecified atrial fibrillation: Secondary | ICD-10-CM | POA: Insufficient documentation

## 2018-05-13 DIAGNOSIS — I8390 Asymptomatic varicose veins of unspecified lower extremity: Secondary | ICD-10-CM | POA: Insufficient documentation

## 2018-05-13 HISTORY — PX: LOOP RECORDER INSERTION: EP1214

## 2018-05-13 SURGERY — LOOP RECORDER INSERTION

## 2018-05-13 MED ORDER — LIDOCAINE-EPINEPHRINE 1 %-1:100000 IJ SOLN
INTRAMUSCULAR | Status: AC
  Filled 2018-05-13: qty 1

## 2018-05-13 MED ORDER — LIDOCAINE-EPINEPHRINE 1 %-1:100000 IJ SOLN
INTRAMUSCULAR | Status: DC | PRN
  Administered 2018-05-13: 20 mL

## 2018-05-13 SURGICAL SUPPLY — 2 items
LOOP REVEAL LINQSYS (Prosthesis & Implant Heart) ×3 IMPLANT
PACK LOOP INSERTION (CUSTOM PROCEDURE TRAY) ×3 IMPLANT

## 2018-05-13 NOTE — Interval H&P Note (Signed)
History and Physical Interval Note:  05/13/2018 11:35 AM  Susan Conway  has presented today for surgery, with the diagnosis of syncope  The various methods of treatment have been discussed with the patient and family. After consideration of risks, benefits and other options for treatment, the patient has consented to  Procedure(s): LOOP RECORDER INSERTION (N/A) as a surgical intervention .  The patient's history has been reviewed, patient examined, no change in status, stable for surgery.  I have reviewed the patient's chart and labs.  Questions were answered to the patient's satisfaction.     Cristopher Peru

## 2018-05-14 ENCOUNTER — Telehealth: Payer: Self-pay | Admitting: Cardiology

## 2018-05-14 NOTE — Telephone Encounter (Addendum)
Bernadette calling for advisement about Susan Conway dressing. I advised her to remove gauze and tegaderm but to leave steri-strips in place and to apply manual pressure x 10 mins if any active bleeding is noted. She verbalizes understanding.  Susan Conway called back at 4:45- removed gauze and tegaderm, steri-strips are saturated with blood. I advised her to leave them in place and to place a clean dressing overtop. She verbalizes understanding.

## 2018-05-14 NOTE — Telephone Encounter (Signed)
Patient and her daughter called to discuss some concerns about incision site where patient had device placed. Called routed to [place].

## 2018-05-14 NOTE — Telephone Encounter (Signed)
Susan Conway and Susan Conway are on Susan phone. Gauze under tegaderm is saturated with blood s/p LINQ ILR implant yesterday by Dr. Lovena Le. She is on Eliquis. They are asking for advice since they are going to see Susan [place], Susan Conway, at 4:30pm. I advised that they remove Susan tegaderm and gauze but leave Susan steri-strips in place. They are not comfortable doing that until they see Susan Conway. Susan Conway would like to text a photo of Susan dressing to me to evaluate- I advised Susan Susan only way for [place] to receive a picture is via MyChart, they don't use MyChart and decline that option. They seem to want to know what will happen in Susan future if it is still bleeding, I don't have a way to evaluate that over Susan phone and I advise that they see Susan Conway as scheduled and have Susan remove Susan dressing (not steri-strips) and hold manual pressure x 10 mins if there is active bleeding noted. For continued bleeding, their options are limited as it is past 4pm. I gave them Susan direct number to Susan device clinic should Susan Conway need advise while seeing [name]. They verbalize understanding.

## 2018-05-22 ENCOUNTER — Telehealth: Payer: Self-pay | Admitting: *Deleted

## 2018-05-22 NOTE — Telephone Encounter (Signed)
Spoke with patient regarding pause episode recorded by ILR on 05/21/18 at 11:11am.  Patient reports she was completely asymptomatic with episode.  Reviewed appropriate symptom activator use and gave Device Clinic phone number if she has any symptom episodes.  Patient aware of wound check appointment tomorrow, 7/18, at 2:00pm.  Patient is aware we will continue to monitor for pause/brady episodes.  She is appreciative of call and denies questions or concerns at this time.  Reviewed ECG with Dr. Marge Duncans med changes at this time as patient's V rates during AF are sometimes elevated and patient was asymptomatic with episode.  Continue to monitor.

## 2018-05-23 ENCOUNTER — Ambulatory Visit (INDEPENDENT_AMBULATORY_CARE_PROVIDER_SITE_OTHER): Payer: Medicare Other | Admitting: *Deleted

## 2018-05-23 ENCOUNTER — Other Ambulatory Visit: Payer: Self-pay | Admitting: Internal Medicine

## 2018-05-23 DIAGNOSIS — R55 Syncope and collapse: Secondary | ICD-10-CM

## 2018-05-23 LAB — CUP PACEART INCLINIC DEVICE CHECK
Date Time Interrogation Session: 20190718143045
MDC IDC PG IMPLANT DT: 20190708

## 2018-05-23 NOTE — Progress Notes (Signed)
Loop wound check in clinic. Steri-strips removed. Wound edges approximated. No redness, swelling or drainage. Moderate ecchymosis in various stages of healing noted inferior to incision. Patient educated about wound care. Battery status: good. R-waves 0.53mV. 0 symptom episodes, 0 tachy episodes, 1 pause episode (05/21/18- pt asymptomatic, see 7/17 phone note), 0 brady episodes. 3 AF episodes (3.7% burden, on Eliquis). Patient tested symptom activator in-office today. Patient educated about [facility] monitoring. Monthly summary reports and ROV with with GT 08/16/18.

## 2018-05-27 ENCOUNTER — Other Ambulatory Visit: Payer: Self-pay | Admitting: *Deleted

## 2018-05-27 MED ORDER — LORAZEPAM 1 MG PO TABS: ORAL_TABLET | ORAL | 1 refills | Status: DC

## 2018-05-27 NOTE — Telephone Encounter (Signed)
Patient called requesting refill on her Lorazepam. Patient is requesting #[phone] with Refills. Is this ok. Pended Rx and sent to Dr. Mariea Clonts for approval.  NCCSRS Database Verified LR: 12/06/2017

## 2018-05-31 ENCOUNTER — Other Ambulatory Visit: Payer: Self-pay

## 2018-05-31 MED ORDER — DIGOXIN 125 MCG PO TABS: 125.0000 ug | ORAL_TABLET | ORAL | 3 refills | Status: DC

## 2018-06-03 ENCOUNTER — Telehealth: Payer: Self-pay | Admitting: *Deleted

## 2018-06-03 NOTE — Telephone Encounter (Signed)
Attempted to call patient regarding manual transmission to review full report and symptoms associated with pause episodes, No answer, phone just rang continuous.

## 2018-06-05 NOTE — Telephone Encounter (Signed)
LVMOM regarding sending manual transmission and symptoms associated with pause episodes.

## 2018-06-05 NOTE — Telephone Encounter (Signed)
Pt was returning Lexi phone call.  

## 2018-06-05 NOTE — Telephone Encounter (Signed)
Spoke with patient regarding sending a manual transmission to review 3 pause episodes-- 1 available ECG appears AF with 3 second pause. Patient verbalized understanding.   Spoke with patient regarding symptoms associated with 3 second pause episode. Patient states she was asymptomatic with 3 second pause and has not had any episodes of syncope or presyncope. Advised patient we would review other episodes and call back if need for further information. Patient verbalized understanding and appreciation.

## 2018-06-07 NOTE — Telephone Encounter (Signed)
Manual transmission received and reviewed.  Pause episodes are from 05/31/18 at 1150, 1210, and 1328, all ~3sec duration.  As patient was asymptomatic, ECGs printed and placed in Dr. Macon Large folder for review.

## 2018-06-10 ENCOUNTER — Ambulatory Visit (INDEPENDENT_AMBULATORY_CARE_PROVIDER_SITE_OTHER): Payer: Medicare Other | Admitting: Podiatry

## 2018-06-10 ENCOUNTER — Telehealth: Payer: Self-pay | Admitting: *Deleted

## 2018-06-10 ENCOUNTER — Encounter: Payer: Self-pay | Admitting: Podiatry

## 2018-06-10 DIAGNOSIS — B351 Tinea unguium: Secondary | ICD-10-CM

## 2018-06-10 DIAGNOSIS — D689 Coagulation defect, unspecified: Secondary | ICD-10-CM

## 2018-06-10 DIAGNOSIS — M79675 Pain in left toe(s): Secondary | ICD-10-CM

## 2018-06-10 NOTE — Telephone Encounter (Signed)
Spoke with patient regarding pause episode on ILR from 06/08/18 at 0947.  Patient denies any symptoms with episode.  She is aware to use her symptom activator and call our office or seek emergency medical attention for presyncope/syncope.  She is aware that we will continue to call for new or longer episodes.  Patient is appreciative of call and denies questions or concerns at this time.

## 2018-06-12 NOTE — Progress Notes (Signed)
Subjective: 84 year old female presents the office today for concerns of recurrent pain to the left big toenail.  She states that she had a nurse look at her facility where she lives the toe is red and swollen but that has improved however there is still pain to the toenail itself.  Pain has improved some.  No recent injury.  No other concerns. Denies any systemic complaints such as fevers, chills, nausea, vomiting. No acute changes since last appointment, and no other complaints at this time.   Objective: AAO x3, NAD DP/PT pulses palpable bilaterally, CRT less than 3 seconds The left hallux toenail is significantly hypertrophic, dystrophic and has yellow discoloration.  There is tenderness to the nail corners and appears that there is some incurvation present within the nail corners.  No signs of infection noted today.  Area of erythema that she is describing is not present today.  No open lesions or pre-ulcerative lesions.  No pain with calf compression, swelling, warmth, erythema  Assessment: Onychodystrophy/onychomycosis left hallux toenail  Plan: -All treatment options discussed with the patient including all alternatives, risks, complications.  -I sharply debrided the left hallux toenail and complications of bleeding I removed a significant portion of the nail especially on the corners that were causing discomfort.  Discussed the total nail avulsion but she declines this. -Patient encouraged to call the office with any questions, concerns, change in symptoms.   Trula Slade DPM

## 2018-06-17 ENCOUNTER — Ambulatory Visit (INDEPENDENT_AMBULATORY_CARE_PROVIDER_SITE_OTHER): Payer: Medicare Other | Admitting: *Deleted

## 2018-06-17 ENCOUNTER — Other Ambulatory Visit: Payer: Self-pay

## 2018-06-17 DIAGNOSIS — R55 Syncope and collapse: Secondary | ICD-10-CM | POA: Diagnosis not present

## 2018-06-17 MED ORDER — ATENOLOL 25 MG PO TABS: 25.0000 mg | ORAL_TABLET | Freq: Every day | ORAL | 1 refills | Status: DC

## 2018-06-17 NOTE — Progress Notes (Signed)
Carelink Summary Report / Loop Recorder 

## 2018-06-17 NOTE — Telephone Encounter (Signed)
A refill request was received from [facility] for atenolol 25 mg. Rx was sent electronically.

## 2018-06-26 ENCOUNTER — Telehealth: Payer: Self-pay | Admitting: *Deleted

## 2018-06-26 NOTE — Telephone Encounter (Signed)
Spoke with patient and advised she would be ok with taking HCTZ, pt's potassium has been within range and she will discuss further with Dr. Mariea Clonts at her next visit

## 2018-06-26 NOTE — Telephone Encounter (Signed)
Spoke with patient regarding 3 second pause noted on LINQ at 12:05. Patient denies symptoms with episode. Patient states she was having people come in and out of her house at the time. She is aware to use her symptom activator and call our office or seek emergency medical attention for presyncope/syncope. Patient is appreciative of call and denies additional questions or concerns.

## 2018-06-26 NOTE — Telephone Encounter (Signed)
It seems Patient has been on this for long time. Has anything Changed recently that she is worried.? Also she does need Labs to Check his Potassium. I don't see any in Epic unless she follows with some other Physician for them.

## 2018-06-26 NOTE — Telephone Encounter (Signed)
Patient called and stated that she had concerns with her Hydrochlorothiazide being made in [place] and side effect of depleting Potassium. Patient wants to know if it is safe for her to be taking. Please Advise.

## 2018-07-10 ENCOUNTER — Telehealth: Payer: Self-pay | Admitting: *Deleted

## 2018-07-10 NOTE — Telephone Encounter (Signed)
Spoke with patient regarding pause episodes on 07/05/18 at 1150, duration 3sec and 07/09/18 at 1225, duration 3sec. Patient denies any symptoms with episodes, reports she is doing well at this time. She is aware we will continue monitoring remotely and denies additional questions or concerns at this time.

## 2018-07-12 ENCOUNTER — Telehealth: Payer: Self-pay | Admitting: *Deleted

## 2018-07-12 NOTE — Telephone Encounter (Signed)
[facility] requesting call back to the [facility] regarding any symptoms during pause episode on 07/10/18 at 1648, duration 3sec. [facility] phone number for return call.

## 2018-07-15 ENCOUNTER — Other Ambulatory Visit: Payer: Self-pay | Admitting: *Deleted

## 2018-07-15 MED ORDER — SIMVASTATIN 20 MG PO TABS: 20.0000 mg | ORAL_TABLET | Freq: Every day | ORAL | 3 refills | Status: DC

## 2018-07-15 NOTE — Telephone Encounter (Signed)
[facility]

## 2018-07-15 NOTE — Telephone Encounter (Signed)
Spoke with patient regarding two 3 second pause episodes from 9/4 at 16:48 and 9/7 10:19. Patient states she was asymptomatic. Advised patient we would place episodes in Dr. Forde Dandy folder for review and would call back if he has any further recommendations. Reminded patient of follow-up appointment on 08/16/18. Patient verbalized understanding.

## 2018-07-18 ENCOUNTER — Ambulatory Visit (INDEPENDENT_AMBULATORY_CARE_PROVIDER_SITE_OTHER): Payer: Medicare Other | Admitting: *Deleted

## 2018-07-18 DIAGNOSIS — R55 Syncope and collapse: Secondary | ICD-10-CM | POA: Diagnosis not present

## 2018-07-18 NOTE — Progress Notes (Signed)
Carelink Summary Report / Loop Recorder 

## 2018-07-19 ENCOUNTER — Telehealth: Payer: Self-pay | Admitting: *Deleted

## 2018-07-19 NOTE — Telephone Encounter (Signed)
Spoke with patient regarding pause episode noted on LINQ from 07/17/18 at 1146, duration 3sec. Patient denies any symptoms with episode, reports she is doing well. Patient is appreciative of call and is aware to call with any symptoms. She denies questions or concerns at this time.

## 2018-07-24 ENCOUNTER — Ambulatory Visit: Payer: Medicare Other | Admitting: Internal Medicine

## 2018-07-24 LAB — CUP PACEART REMOTE DEVICE CHECK
Implantable Pulse Generator Implant Date: 20190708
MDC IDC SESS DTM: 20190810154107

## 2018-07-25 ENCOUNTER — Telehealth: Payer: Self-pay | Admitting: *Deleted

## 2018-07-25 NOTE — Telephone Encounter (Signed)
Spoke with patient regarding 3sec duration pause episode recorded on 07/24/18 at 1252. Patient denies symptoms with episode, reports she continues to feel well. She verbalizes appreciation of our monitoring and f/u calls. She denies additional questions or concerns at this time.

## 2018-08-02 LAB — CUP PACEART REMOTE DEVICE CHECK
Implantable Pulse Generator Implant Date: 20190708
MDC IDC SESS DTM: 20190912163827

## 2018-08-12 DIAGNOSIS — C44729 Squamous cell carcinoma of skin of left lower limb, including hip: Secondary | ICD-10-CM | POA: Diagnosis not present

## 2018-08-12 DIAGNOSIS — Z85828 Personal history of other malignant neoplasm of skin: Secondary | ICD-10-CM | POA: Diagnosis not present

## 2018-08-12 DIAGNOSIS — L821 Other seborrheic keratosis: Secondary | ICD-10-CM | POA: Diagnosis not present

## 2018-08-12 DIAGNOSIS — C4441 Basal cell carcinoma of skin of scalp and neck: Secondary | ICD-10-CM | POA: Diagnosis not present

## 2018-08-12 DIAGNOSIS — D485 Neoplasm of uncertain behavior of skin: Secondary | ICD-10-CM | POA: Diagnosis not present

## 2018-08-12 DIAGNOSIS — D0471 Carcinoma in situ of skin of right lower limb, including hip: Secondary | ICD-10-CM | POA: Diagnosis not present

## 2018-08-14 ENCOUNTER — Encounter: Payer: Medicare Other | Admitting: Internal Medicine

## 2018-08-16 ENCOUNTER — Ambulatory Visit (INDEPENDENT_AMBULATORY_CARE_PROVIDER_SITE_OTHER): Payer: Medicare Other | Admitting: Internal Medicine

## 2018-08-16 ENCOUNTER — Encounter: Payer: Self-pay | Admitting: Internal Medicine

## 2018-08-16 VITALS — BP 126/82 | HR 78 | Ht 66.0 in | Wt 131.6 lb

## 2018-08-16 DIAGNOSIS — Z951 Presence of aortocoronary bypass graft: Secondary | ICD-10-CM

## 2018-08-16 DIAGNOSIS — I2510 Atherosclerotic heart disease of native coronary artery without angina pectoris: Secondary | ICD-10-CM | POA: Diagnosis not present

## 2018-08-16 DIAGNOSIS — R55 Syncope and collapse: Secondary | ICD-10-CM

## 2018-08-16 DIAGNOSIS — I4821 Permanent atrial fibrillation: Secondary | ICD-10-CM | POA: Diagnosis not present

## 2018-08-16 NOTE — Progress Notes (Signed)
HPI Mrs. Kadar returns today for ILR followup. She is a pleasant elderly woman with unexplained syncope, atrial fib, and CAD. She underwent ILR insertion 3 months ago. She has not had syncope. She was noted to have pauses during the day upto 3 seconds but was asymptomatic. She did have a 5 second pause several weeks ago. She did not pass out but felt like her vision was going dim. She has had several 3 second episodes. She has chronic atrial fib and at times and RVR. Also, she has RBBB and left axis. Allergies  Allergen Reactions  . Iodinated Diagnostic Agents Nausea Only and Other (See Comments)    Severe nausea and also passed out  . Bactrim Rash  . Relafen [Nabumetone] Rash     Current Outpatient Medications  Medication Sig Dispense Refill  . apixaban (ELIQUIS) 2.5 MG TABS tablet Take 1 tablet (2.5 mg total) by mouth 2 (two) times daily. 180 tablet 1  . atenolol (TENORMIN) 25 MG tablet Take 1 tablet (25 mg total) by mouth daily. 90 tablet 1  . calcium carbonate (OS-CAL) 600 MG TABS Take 600 mg by mouth 2 (two) times daily with a meal.     . cholecalciferol (VITAMIN D) 1000 UNITS tablet Take 1,000 Units by mouth daily. For Vitamin D supplement.    . digoxin (LANOXIN) 0.125 MG tablet Take 1 tablet (125 mcg total) by mouth every other day. 45 tablet 3  . hydrochlorothiazide (HYDRODIURIL) 25 MG tablet TAKE 1 TABLET ONCE DAILY 90 tablet 1  . LORazepam (ATIVAN) 1 MG tablet Take one tablet by mouth at bedtime to decrease anxiety and help with sleep 90 tablet 1  . simvastatin (ZOCOR) 20 MG tablet Take 1 tablet (20 mg total) by mouth daily at 6 PM. 90 tablet 3   No current facility-administered medications for this visit.      Past Medical History:  Diagnosis Date  . Actinic keratosis 05/25/2014  . Anxiety   . Atrial fibrillation ([place]) 09/21/2010  . Basal cell carcinoma 05/25/2014   Multiple removed by Dr. Danella Sensing in March 2015: right neck, left neck, scalp   . Bell's palsy  07/18/1979  . Cervicalgia 01/31/2012  . Closed fracture of lumbar vertebra without mention of spinal cord injury 07/17/2005  . Conjunctiva disorder 12/26/2010  . Coronary atherosclerosis of native coronary artery 07/17/1997  . Cramp of limb 08/16/2009  . Degeneration of lumbar or lumbosacral intervertebral disc 01/31/2012  . Disturbance of skin sensation 08/2009  . Dizziness and giddiness 11/08/2011   vertigo  . External hemorrhoids without mention of complication 3/50/0938  . External hemorrhoids without mention of complication 18/29/9371  . Ganglion of tendon sheath 08/16/2009  . Leg cramp 10/27/2013   Most frequently the right leg.   . Long term (current) use of anticoagulants 09/2010  . Lumbago 07/2009  . Meralgia paresthetica 07/2007  . MI, old   . Myalgia and myositis, unspecified 01/17/2012  . Osteoporosis   . Other abnormal blood chemistry 04/08/2012  . Other abnormal blood chemistry 2013   hyperglycemia  . Other disorder of muscle, ligament, and fascia 04/08/2012  . Other specified cardiac dysrhythmias(427.89) 06/27/2010  . Pain in joint, ankle and foot 10/27/2013   Bilateral since 1998   . Pain in joint, shoulder region 08/12/2012  . Pain in joint, upper arm 12/26/2010  . Pain in limb 01/11/2011  . Pain in thoracic spine 01/31/2012  . 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 PVC's (premature  ventricular contractions)   . Rash and other nonspecific skin eruption 08/02/2011  . Senile osteoporosis 07/17/1993  . Unspecified essential hypertension 07/17/1997  . Varicose veins of lower extremities 08/16/2009  . Varicose veins of lower extremities 08/16/2009    ROS:   All systems reviewed and negative except as noted in the HPI.   Past Surgical History:  Procedure Laterality Date  . COLONOSCOPY WITH PROPOFOL N/A 10/19/2015   Procedure: COLONOSCOPY WITH PROPOFOL;  Surgeon: Gatha Mayer, MD;  Location: [facility];  Service: Endoscopy;  Laterality: N/A;  . CORONARY ARTERY BYPASS  GRAFT  1998   x2; LIMA to LAD; SVG to diagonal off bypass  . HEMORRHOID SURGERY  08/26/2012   Dr. Brantley Stage  . LOOP RECORDER INSERTION N/A 05/13/2018   Procedure: LOOP RECORDER INSERTION;  Surgeon: [name], MD;  Location: [facility];  Service: Cardiovascular;  Laterality: N/A;  . MASS EXCISION Left 11/23/2015   Procedure: LEFT WRIST EXCISION CYST;  Surgeon: Leanora Cover, MD;  Location: [facility];  Service: Orthopedics;  Laterality: Left;  . SKIN BIOPSY  01/29/14   (R) neck; (R) scalp, 2 (L) neck; shave biopsy Superficial basal cell carcinoma Dr. Danella Sensing  . TONSILLECTOMY  1940's     Family History  Problem Relation Age of Onset  . Heart attack Father 72  . Heart disease Father   . Hypertension Sister   . Heart disease Sister   . Heart disease Brother      Social History   Socioeconomic History  . Marital status: Widowed    Spouse name: Not on file  . Number of children: Not on file  . Years of education: Not on file  . Highest education level: Not on file  Occupational History  . Occupation: Retired Tour manager  . Financial resource strain: Not on file  . Food insecurity:    Worry: Not on file    Inability: Not on file  . Transportation needs:    Medical: Not on file    Non-medical: Not on file  Tobacco Use  . Smoking status: Never Smoker  . Smokeless tobacco: Never Used  Substance and Sexual Activity  . Alcohol use: Yes    Comment: occasional wine  . Drug use: No  . Sexual activity: Never  Lifestyle  . Physical activity:    Days per week: Not on file    Minutes per session: Not on file  . Stress: Not on file  Relationships  . Social connections:    Talks on phone: Not on file    Gets together: Not on file    Attends religious service: Not on file    Active member of club or organization: Not on file    Attends meetings of clubs or organizations: Not on file    Relationship status: Not on file  . Intimate partner  violence:    Fear of current or ex partner: Not on file    Emotionally abused: Not on file    Physically abused: Not on file    Forced sexual activity: Not on file  Other Topics Concern  . Not on file  Social History Narrative   Living at [facility] since 2010   Widowed 09/03/2016   Never smoked   Alcohol occasional wine   Exercise, house work    DNR , POA, Living Will     BP 126/82   Pulse 78   Ht 5\' 6"  (1.676 m)  Wt 131 lb 9.6 oz (59.7 kg)   SpO2 98%   BMI 21.24 kg/m   Physical Exam:  Well appearing NAD HEENT: Unremarkable Neck:  No JVD, no thyromegally Lymphatics:  No adenopathy Back:  No CVA tenderness Lungs:  Clear with no wheezes HEART:  Regular rate rhythm, no murmurs, no rubs, no clicks Abd:  soft, positive bowel sounds, no organomegally, no rebound, no guarding Ext:  2 plus pulses, no edema, no cyanosis, no clubbing Skin:  No rashes no nodules Neuro:  CN II through XII intact, motor grossly intact  EKG - atrial fib with RBBB, left axis and a controlled Vr  DEVICE  Normal device function.  See PaceArt for details.   Assess/Plan: 1. Syncope - she has not had frank syncope since her ILR was placed but she has had pauses some were symptomatic but most not. I think she has pronounced conduction system disease and I have recommended insertion of a PPM. 2. Atrial fib - her rate is controlled. We will watch for RVR and SVR. 3. HTN - her bp is controlled today. No change.  4. Disp. - I have reviewed the treatment options with the patient. She has tachy-brady syndrome and is at increased risk for falls. I have recommended a PPM.  Mikle Bosworth.D.

## 2018-08-16 NOTE — Patient Instructions (Addendum)
Medication Instructions:  Your physician recommends that you continue on your current medications as directed. Please refer to the Current Medication list given to you today.  Labwork: You will get lab work today:  BMP and CBC.  Testing/Procedures: Your physician has recommended that you have a pacemaker inserted. A pacemaker is a small device that is placed under the skin of your chest or abdomen to help control abnormal heart rhythms. This device uses electrical pulses to prompt the heart to beat at a normal rate. Pacemakers are used to treat heart rhythms that are too slow. Wire (leads) are attached to the pacemaker that goes into the chambers of you heart. This is done in the hospital and usually requires and overnight stay. Please see the instruction sheet given to you today for more information.  BENEFITS of procedure:    1.  NO more heart pauses 2.  Easier to control blood pressure   The following days are available for procedures:  October 25, 28, 29 November 4, 7, 15, 18, 19, 25, 26 If you decide on a day please give me a call:  Bing Neighbors [phone]   Pacemaker Implantation, Adult Pacemaker implantation is a procedure to place a pacemaker inside your chest. A pacemaker is a small computer that sends electrical signals to the heart and helps your heart beat normally. A pacemaker also stores information about your heart rhythms. You may need pacemaker implantation if you:  Have a slow heartbeat (bradycardia).  Faint (syncope).  Have shortness of breath (dyspnea) due to heart problems.  The pacemaker attaches to your heart through a wire, called a lead. Sometimes just one lead is needed. Other times, there will be two leads. There are two types of pacemakers:  Transvenous pacemaker. This type is placed under the skin or muscle of your chest. The lead goes through a vein in the chest area to reach the inside of the heart.  Epicardial pacemaker. This type is placed under the  skin or muscle of your chest or belly. The lead goes through your chest to the outside of the heart.  Tell a health care provider about:  Any allergies you have.  All medicines you are taking, including vitamins, herbs, eye drops, creams, and over-the-counter medicines.  Any problems you or family members have had with anesthetic medicines.  Any blood or bone disorders you have.  Any surgeries you have had.  Any medical conditions you have.  Whether you are pregnant or may be pregnant. What are the risks? Generally, this is a safe procedure. However, problems may occur, including:  Infection.  Bleeding.  Failure of the pacemaker or the lead.  Collapse of a lung or bleeding into a lung.  Blood clot inside a blood vessel with a lead.  Damage to the heart.  Infection inside the heart (endocarditis).  Allergic reactions to medicines.  What happens before the procedure? Staying hydrated Follow instructions from your health care provider about hydration, which may include:  Up to 2 hours before the procedure - you may continue to drink clear liquids, such as water, clear fruit juice, black coffee, and plain tea.  Eating and drinking restrictions Follow instructions from your health care provider about eating and drinking, which may include:  8 hours before the procedure - stop eating heavy meals or foods such as meat, fried foods, or fatty foods.  6 hours before the procedure - stop eating light meals or foods, such as toast or cereal.  6 hours before  the procedure - stop drinking milk or drinks that contain milk.  2 hours before the procedure - stop drinking clear liquids.  Medicines  Ask your health care provider about: ? Changing or stopping your regular medicines. This is especially important if you are taking diabetes medicines or blood thinners. ? Taking medicines such as aspirin and ibuprofen. These medicines can thin your blood. Do not take these medicines  before your procedure if your health care provider instructs you not to.  You may be given antibiotic medicine to help prevent infection. General instructions  You will have a heart evaluation. This may include an electrocardiogram (ECG), chest X-ray, and heart imaging (echocardiogram,  or echo) tests.  You will have blood tests.  Do not use any products that contain nicotine or tobacco, such as cigarettes and e-cigarettes. If you need help quitting, ask your health care provider.  Plan to have someone take you home from the hospital or clinic.  If you will be going home right after the procedure, plan to have someone with you for 24 hours.  Ask your health care provider how your surgical site will be marked or identified. What happens during the procedure?  To reduce your risk of infection: ? Your health care team will wash or sanitize their hands. ? Your skin will be washed with soap. ? Hair may be removed from the surgical area.  An IV tube will be inserted into one of your veins.  You will be given one or more of the following: ? A medicine to help you relax (sedative). ? A medicine to numb the area (local anesthetic). ? A medicine to make you fall asleep (general anesthetic).  If you are getting a transvenous pacemaker: ? An incision will be made in your upper chest. ? A pocket will be made for the pacemaker. It may be placed under the skin or between layers of muscle. ? The lead will be inserted into a blood vessel that returns to the heart. ? While X-rays are taken by an imaging machine (fluoroscopy), the lead will be advanced through the vein to the inside of your heart. ? The other end of the lead will be tunneled under the skin and attached to the pacemaker.  If you are getting an epicardial pacemaker: ? An incision will be made near your ribs or breastbone (sternum) for the lead. ? The lead will be attached to the outside of your heart. ? Another incision will be  made in your chest or upper belly to create a pocket for the pacemaker. ? The free end of the lead will be tunneled under the skin and attached to the pacemaker.  The transvenous or epicardial pacemaker will be tested. Imaging studies may be done to check the lead position.  The incisions will be closed with stitches (sutures), adhesive strips, or skin glue.  Bandages (dressing) will be placed over the incisions. The procedure may vary among health care providers and hospitals. What happens after the procedure?  Your blood pressure, heart rate, breathing rate, and blood oxygen level will be monitored until the medicines you were given have worn off.  You will be given antibiotics and pain medicine.  ECG and chest x-rays will be done.  You will wear a continuous type of ECG (Holter monitor) to check your heart rhythm.  Your health care provider willprogram the pacemaker.  Do not drive for 24 hours if you received a sedative. This information is not intended to replace advice  given to you by your health care provider. Make sure you discuss any questions you have with your health care provider. Document Released: 10/13/2002 Document Revised: 05/12/2016 Document Reviewed: 04/05/2016 Elsevier Interactive Patient Education  [employer].

## 2018-08-17 LAB — BASIC METABOLIC PANEL
BUN / CREAT RATIO: 20 (ref 12–28)
BUN: 22 mg/dL (ref 8–27)
CO2: 30 mmol/L — AB (ref 20–29)
Calcium: 9.7 mg/dL (ref 8.7–10.3)
Chloride: 95 mmol/L — ABNORMAL LOW (ref 96–106)
Creatinine, Ser: 1.1 mg/dL — ABNORMAL HIGH (ref 0.57–1.00)
GFR calc non Af Amer: 45 mL/min/{1.73_m2} — ABNORMAL LOW (ref >59)
GFR, EST AFRICAN AMERICAN: 52 mL/min/{1.73_m2} — AB (ref >59)
GLUCOSE: 106 mg/dL — AB (ref 65–99)
POTASSIUM: 3.7 mmol/L (ref 3.5–5.2)
SODIUM: 142 mmol/L (ref 134–144)

## 2018-08-17 LAB — CBC WITH DIFFERENTIAL/PLATELET
BASOS: 1 %
Basophils Absolute: 0.1 10*3/uL (ref 0.0–0.2)
EOS (ABSOLUTE): 0 10*3/uL (ref 0.0–0.4)
Eos: 1 %
HEMOGLOBIN: 12.9 g/dL (ref 11.1–15.9)
Hematocrit: 39.6 % (ref 34.0–46.6)
Immature Grans (Abs): 0 10*3/uL (ref 0.0–0.1)
Immature Granulocytes: 1 %
Lymphocytes Absolute: 2.5 10*3/uL (ref 0.7–3.1)
Lymphs: 31 %
MCH: 28.9 pg (ref 26.6–33.0)
MCHC: 32.6 g/dL (ref 31.5–35.7)
MCV: 89 fL (ref 79–97)
MONOS ABS: 1.8 10*3/uL — AB (ref 0.1–0.9)
Monocytes: 22 %
NEUTROS ABS: 3.7 10*3/uL (ref 1.4–7.0)
Neutrophils: 44 %
Platelets: 124 10*3/uL — ABNORMAL LOW (ref 150–450)
RBC: 4.47 x10E6/uL (ref 3.77–5.28)
RDW: 12.3 % (ref 12.3–15.4)
WBC: 8.2 10*3/uL (ref 3.4–10.8)

## 2018-08-20 ENCOUNTER — Ambulatory Visit (INDEPENDENT_AMBULATORY_CARE_PROVIDER_SITE_OTHER): Payer: Medicare Other | Admitting: *Deleted

## 2018-08-20 DIAGNOSIS — R55 Syncope and collapse: Secondary | ICD-10-CM

## 2018-08-21 NOTE — Progress Notes (Signed)
Carelink Summary Report / Loop Recorder 

## 2018-08-30 DIAGNOSIS — Z23 Encounter for immunization: Secondary | ICD-10-CM | POA: Diagnosis not present

## 2018-09-02 LAB — CUP PACEART REMOTE DEVICE CHECK
MDC IDC PG IMPLANT DT: 20190708
MDC IDC SESS DTM: 20191015163704

## 2018-09-10 ENCOUNTER — Non-Acute Institutional Stay: Payer: Medicare Other

## 2018-09-10 VITALS — BP 140/78 | HR 77 | Temp 98.1°F | Ht 66.0 in | Wt 134.0 lb

## 2018-09-10 DIAGNOSIS — Z0000 Encounter for general adult medical examination without abnormal findings: Secondary | ICD-10-CM

## 2018-09-10 MED ORDER — TETANUS-DIPHTH-ACELL PERTUSSIS 5-2.5-18.5 LF-MCG/0.5 IM SUSP: 0.5000 mL | Freq: Once | INTRAMUSCULAR | 0 refills | Status: AC

## 2018-09-10 NOTE — Progress Notes (Signed)
Subjective:   Susan Conway is a 84 y.o. female who presents for Medicare Annual (Subsequent) preventive examination at [facility]-08/14/2017       Objective:     Vitals: There were no vitals taken for this visit.  There is no height or weight on file to calculate BMI.  Advanced Directives 03/20/2018 08/14/2017 03/31/2017 03/21/2017 05/29/2016 03/22/2016 12/15/2015  Does Patient Have a Medical Advance Directive? Yes Yes - Yes Yes Yes Yes  Type of Advance Directive [place];Out of facility DNR (pink MOST or yellow form) [place];Living will - [place];Living will;Out of facility DNR (pink MOST or yellow form) [place];Living will [place];Out of facility DNR (pink MOST or yellow form) Healthcare Power of Attorney  Does patient want to make changes to medical advance directive? No - Patient declined No - Patient declined - - - - No - Patient declined  Copy of [place] in Chart? Yes Yes Yes Yes - Yes Yes  Pre-existing out of facility DNR order (yellow form or pink MOST form) Yellow form placed in chart (order not valid for inpatient use) - - - - Yellow form placed in chart (order not valid for inpatient use) Yellow form placed in chart (order not valid for inpatient use)    Tobacco Social History   Tobacco Use  Smoking Status Never Smoker  Smokeless Tobacco Never Used     Counseling given: Not Answered   Clinical Intake:                       Past Medical History:  Diagnosis Date  . Actinic keratosis 05/25/2014  . Anxiety   . Atrial fibrillation ([place]) 09/21/2010  . Basal cell carcinoma 05/25/2014   Multiple removed by Dr. Danella Sensing in March 2015: right neck, left neck, scalp   . Bell's palsy 07/18/1979  . Cervicalgia 01/31/2012  . Closed fracture of lumbar vertebra without mention of spinal cord injury 07/17/2005  .  Conjunctiva disorder 12/26/2010  . Coronary atherosclerosis of native coronary artery 07/17/1997  . Cramp of limb 08/16/2009  . Degeneration of lumbar or lumbosacral intervertebral disc 01/31/2012  . Disturbance of skin sensation 08/2009  . Dizziness and giddiness 11/08/2011   vertigo  . External hemorrhoids without mention of complication 9/56/3875  . External hemorrhoids without mention of complication 64/33/2951  . Ganglion of tendon sheath 08/16/2009  . Leg cramp 10/27/2013   Most frequently the right leg.   . Long term (current) use of anticoagulants 09/2010  . Lumbago 07/2009  . Meralgia paresthetica 07/2007  . MI, old   . Myalgia and myositis, unspecified 01/17/2012  . Osteoporosis   . Other abnormal blood chemistry 04/08/2012  . Other abnormal blood chemistry 2013   hyperglycemia  . Other disorder of muscle, ligament, and fascia 04/08/2012  . Other specified cardiac dysrhythmias(427.89) 06/27/2010  . Pain in joint, ankle and foot 10/27/2013   Bilateral since 1998   . Pain in joint, shoulder region 08/12/2012  . Pain in joint, upper arm 12/26/2010  . Pain in limb 01/11/2011  . Pain in thoracic spine 01/31/2012  . 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 PVC's (premature ventricular contractions)   . Rash and other nonspecific skin eruption 08/02/2011  . Senile osteoporosis 07/17/1993  . Unspecified essential hypertension 07/17/1997  . Varicose veins of lower extremities 08/16/2009  . Varicose veins of  lower extremities 08/16/2009   Past Surgical History:  Procedure Laterality Date  . COLONOSCOPY WITH PROPOFOL N/A 10/19/2015   Procedure: COLONOSCOPY WITH PROPOFOL;  Surgeon: Gatha Mayer, MD;  Location: [facility];  Service: Endoscopy;  Laterality: N/A;  . CORONARY ARTERY BYPASS GRAFT  1998   x2; LIMA to LAD; SVG to diagonal off bypass  . HEMORRHOID SURGERY  08/26/2012   Dr. Brantley Stage  . LOOP RECORDER INSERTION N/A 05/13/2018   Procedure: LOOP RECORDER INSERTION;  Surgeon: [name], MD;  Location: [facility];  Service: Cardiovascular;  Laterality: N/A;  . MASS EXCISION Left 11/23/2015   Procedure: LEFT WRIST EXCISION CYST;  Surgeon: Leanora Cover, MD;  Location: [facility];  Service: Orthopedics;  Laterality: Left;  . SKIN BIOPSY  01/29/14   (R) neck; (R) scalp, 2 (L) neck; shave biopsy Superficial basal cell carcinoma Dr. Danella Sensing  . TONSILLECTOMY  1940's   Family History  Problem Relation Age of Onset  . Heart attack Father 44  . Heart disease Father   . Hypertension Sister   . Heart disease Sister   . Heart disease Brother    Social History   Socioeconomic History  . Marital status: Widowed    Spouse name: Not on file  . Number of children: Not on file  . Years of education: Not on file  . Highest education level: Not on file  Occupational History  . Occupation: Retired Tour manager  . Financial resource strain: Not on file  . Food insecurity:    Worry: Not on file    Inability: Not on file  . Transportation needs:    Medical: Not on file    Non-medical: Not on file  Tobacco Use  . Smoking status: Never Smoker  . Smokeless tobacco: Never Used  Substance and Sexual Activity  . Alcohol use: Yes    Comment: occasional wine  . Drug use: No  . Sexual activity: Never  Lifestyle  . Physical activity:    Days per week: Not on file    Minutes per session: Not on file  . Stress: Not on file  Relationships  . Social connections:    Talks on phone: Not on file    Gets together: Not on file    Attends religious service: Not on file    Active member of club or organization: Not on file    Attends meetings of clubs or organizations: Not on file    Relationship status: Not on file  Other Topics Concern  . Not on file  Social History Narrative   [facility] at [facility] since [facility]   Widowed 09/03/2016   Never smoked   Alcohol occasional wine   Exercise, house work    DNR , POA, Living Will    Outpatient  Encounter Medications as of 09/10/2018  Medication Sig  . apixaban (ELIQUIS) 2.5 MG TABS tablet Take 1 tablet (2.5 mg total) by mouth 2 (two) times daily.  Marland Kitchen atenolol (TENORMIN) 25 MG tablet Take 1 tablet (25 mg total) by mouth daily.  . calcium carbonate (OS-CAL) 600 MG TABS Take 600 mg by mouth 2 (two) times daily with a meal.   . cholecalciferol (VITAMIN D) 1000 UNITS tablet Take 1,000 Units by mouth daily. For Vitamin D supplement.  . digoxin (LANOXIN) 0.125 MG tablet Take 1 tablet (125 mcg total) by mouth every other day.  . hydrochlorothiazide (HYDRODIURIL) 25 MG tablet TAKE 1 TABLET ONCE DAILY  .  LORazepam (ATIVAN) 1 MG tablet Take one tablet by mouth at bedtime to decrease anxiety and help with sleep  . simvastatin (ZOCOR) 20 MG tablet Take 1 tablet (20 mg total) by mouth daily at 6 PM.   No facility-administered encounter medications on file as of 09/10/2018.     Activities of Daily Living No flowsheet data found.  Patient Care Team: [name], DO as PCP - General (Geriatric Medicine) [name], MD as Consulting Physician (Dermatology) [name], MD as Consulting Physician (Cardiology) [facility] [name], MD as Consulting Physician (Dermatology)    Assessment:   This is a routine wellness examination for [place].  Exercise Activities and Dietary recommendations    Goals    . Maintain Lifestyle     Starting today pt will maintain lifestyle.        Fall Risk Fall Risk  03/20/2018 09/19/2017 08/14/2017 09/20/2016 03/22/2016  Falls in the past year? No No No No No  Number falls in past yr: - - - - -  Comment - - - - -  Injury with Fall? - - - - -   Is the patient's home free of loose throw rugs in walkways, pet beds, electrical cords, etc?   yes      Grab bars in the bathroom? yes      Handrails on the stairs?   yes      Adequate lighting?   yes  Depression Screen PHQ 2/9 Scores 03/20/2018 09/19/2017 08/14/2017 09/20/2016    PHQ - 2 Score 0 0 0 0     Cognitive Function MMSE - Mini Mental State Exam 08/14/2017 09/22/2015  Orientation to time 5 5  Orientation to Place 5 5  Registration 3 3  Attention/ Calculation 5 5  Recall 2 3  Language- name 2 objects 2 2  Language- repeat 1 1  Language- follow 3 step command 3 3  Language- read & follow direction 1 1  Write a sentence 1 1  Copy design 1 1  Total score 29 30        Immunization History  Administered Date(s) Administered  . Influenza Whole 08/06/2012  . Influenza,inj,Quad PF,6+ Mos 08/30/2018  . Influenza-Unspecified 08/06/2013, 08/24/2014, 08/26/2015, 08/31/2016, 08/27/2017  . Pneumococcal Conjugate-13 09/22/2015  . Pneumococcal Polysaccharide-23 08/29/2006  . Td 10/07/2007  . Zoster 03/12/2008  . Zoster Recombinat (Shingrix) 02/12/2018, 05/13/2018    Qualifies for Shingles Vaccine?up to date, completed  Screening Tests Health Maintenance  Topic Date Due  . TETANUS/TDAP  10/06/2017  . INFLUENZA VACCINE  Completed  . DEXA SCAN  Completed  . PNA vac Low Risk Adult  Completed    Cancer Screenings: Lung: Low Dose CT Chest recommended if Age 83-80 years, 30 pack-year currently smoking OR have quit w/in 15years. Patient does not qualify. Breast:  Up to date on Mammogram? Yes   Up to date of Bone Density/Dexa? Yes Colorectal: up to date  Additional Screenings:  Hepatitis C Screening: declined TDAP due: ordered to pharmacy     Plan:    I have personally reviewed and addressed the Medicare Annual Wellness questionnaire and have noted the following in the patient's chart:  A. Medical and social history B. Use of alcohol, tobacco or illicit drugs  C. Current medications and supplements D. Functional ability and status E.  Nutritional status F.  Physical activity G. Advance directives H. List of other physicians I.  Hospitalizations, surgeries, and ER visits in previous 12 months J.  Vitals K. Screenings to include hearing,  vision, cognitive, depression L. Referrals and appointments - none  In addition, I have reviewed and discussed with patient certain preventive protocols, quality metrics, and best practice recommendations. A written personalized care plan for preventive services as well as general preventive health recommendations were provided to patient.  See attached scanned questionnaire for additional information.   Signed,   Tyson Dense, RN Nurse Health Advisor  Patient Concerns: None

## 2018-09-10 NOTE — Patient Instructions (Signed)
Susan Conway , Thank you for taking time to come for your Medicare Wellness Visit. I appreciate your ongoing commitment to your health goals. Please review the following plan we discussed and let me know if I can assist you in the future.   Screening recommendations/referrals: Colonoscopy excluded, over age 82 Mammogram exclude, over age 63 Bone Density up to date Recommended yearly ophthalmology/optometry visit for glaucoma screening and checkup Recommended yearly dental visit for hygiene and checkup  Vaccinations: Influenza vaccine up to date Pneumococcal vaccine up to date, completed Tdap vaccine due, prescription sent to pharmacy Shingles vaccine up to date, completed    Advanced directives: in chart  Conditions/risks identified: none  Next appointment: Dr. Mariea Clonts 10/02/2018 @11am    Preventive Care 20 Years and Older, Female Preventive care refers to lifestyle choices and visits with your health care provider that can promote health and wellness. What does preventive care include?  A yearly physical exam. This is also called an annual well check.  Dental exams once or twice a year.  Routine eye exams. Ask your health care provider how often you should have your eyes checked.  Personal lifestyle choices, including:  Daily care of your teeth and gums.  Regular physical activity.  Eating a healthy diet.  Avoiding tobacco and drug use.  Limiting alcohol use.  Practicing safe sex.  Taking low-dose aspirin every day.  Taking vitamin and mineral supplements as recommended by your health care provider. What happens during an annual well check? The services and screenings done by your health care provider during your annual well check will depend on your age, overall health, lifestyle risk factors, and family history of disease. Counseling  Your health care provider may ask you questions about your:  Alcohol use.  Tobacco use.  Drug use.  Emotional  well-being.  Home and relationship well-being.  Sexual activity.  Eating habits.  History of falls.  Memory and ability to understand (cognition).  Work and work Statistician.  Reproductive health. Screening  You may have the following tests or measurements:  Height, weight, and BMI.  Blood pressure.  Lipid and cholesterol levels. These may be checked every 5 years, or more frequently if you are over 44 years old.  Skin check.  Lung cancer screening. You may have this screening every year starting at age 57 if you have a 30-pack-year history of smoking and currently smoke or have quit within the past 15 years.  Fecal occult blood test (FOBT) of the stool. You may have this test every year starting at age 66.  Flexible sigmoidoscopy or colonoscopy. You may have a sigmoidoscopy every 5 years or a colonoscopy every 10 years starting at age 11.  Hepatitis C blood test.  Hepatitis B blood test.  Sexually transmitted disease (STD) testing.  Diabetes screening. This is done by checking your blood sugar (glucose) after you have not eaten for a while (fasting). You may have this done every 1-3 years.  Bone density scan. This is done to screen for osteoporosis. You may have this done starting at age 9.  Mammogram. This may be done every 1-2 years. Talk to your health care provider about how often you should have regular mammograms. Talk with your health care provider about your test results, treatment options, and if necessary, the need for more tests. Vaccines  Your health care provider may recommend certain vaccines, such as:  Influenza vaccine. This is recommended every year.  Tetanus, diphtheria, and acellular pertussis (Tdap, Td) vaccine. You may  need a Td booster every 10 years.  Zoster vaccine. You may need this after age 60.  Pneumococcal 13-valent conjugate (PCV13) vaccine. One dose is recommended after age 31.  Pneumococcal polysaccharide (PPSV23) vaccine. One  dose is recommended after age 33. Talk to your health care provider about which screenings and vaccines you need and how often you need them. This information is not intended to replace advice given to you by your health care provider. Make sure you discuss any questions you have with your health care provider. Document Released: 11/19/2015 Document Revised: 07/12/2016 Document Reviewed: 08/24/2015 Elsevier Interactive Patient Education  2017 [place] Prevention in the Home Falls can cause injuries. They can happen to people of all ages. There are many things you can do to make your home safe and to help prevent falls. What can I do on the outside of my home?  Regularly fix the edges of walkways and driveways and fix any cracks.  Remove anything that might make you trip as you walk through a door, such as a raised step or threshold.  Trim any bushes or trees on the path to your home.  Use bright outdoor lighting.  Clear any walking paths of anything that might make someone trip, such as rocks or tools.  Regularly check to see if handrails are loose or broken. Make sure that both sides of any steps have handrails.  Any raised decks and porches should have guardrails on the edges.  Have any leaves, snow, or ice cleared regularly.  Use sand or salt on walking paths during winter.  Clean up any spills in your garage right away. This includes oil or grease spills. What can I do in the bathroom?  Use night lights.  Install grab bars by the toilet and in the tub and shower. Do not use towel bars as grab bars.  Use non-skid mats or decals in the tub or shower.  If you need to sit down in the shower, use a plastic, non-slip stool.  Keep the floor dry. Clean up any water that spills on the floor as soon as it happens.  Remove soap buildup in the tub or shower regularly.  Attach bath mats securely with double-sided non-slip rug tape.  Do not have throw rugs and other  things on the floor that can make you trip. What can I do in the bedroom?  Use night lights.  Make sure that you have a light by your bed that is easy to reach.  Do not use any sheets or blankets that are too big for your bed. They should not hang down onto the floor.  Have a firm chair that has side arms. You can use this for support while you get dressed.  Do not have throw rugs and other things on the floor that can make you trip. What can I do in the kitchen?  Clean up any spills right away.  Avoid walking on wet floors.  Keep items that you use a lot in easy-to-reach places.  If you need to reach something above you, use a strong step stool that has a grab bar.  Keep electrical cords out of the way.  Do not use floor polish or wax that makes floors slippery. If you must use wax, use non-skid floor wax.  Do not have throw rugs and other things on the floor that can make you trip. What can I do with my stairs?  Do not leave any items on  the stairs.  Make sure that there are handrails on both sides of the stairs and use them. Fix handrails that are broken or loose. Make sure that handrails are as long as the stairways.  Check any carpeting to make sure that it is firmly attached to the stairs. Fix any carpet that is loose or worn.  Avoid having throw rugs at the top or bottom of the stairs. If you do have throw rugs, attach them to the floor with carpet tape.  Make sure that you have a light switch at the top of the stairs and the bottom of the stairs. If you do not have them, ask someone to add them for you. What else can I do to help prevent falls?  Wear shoes that:  Do not have high heels.  Have rubber bottoms.  Are comfortable and fit you well.  Are closed at the toe. Do not wear sandals.  If you use a stepladder:  Make sure that it is fully opened. Do not climb a closed stepladder.  Make sure that both sides of the stepladder are locked into place.  Ask  someone to hold it for you, if possible.  Clearly mark and make sure that you can see:  Any grab bars or handrails.  First and last steps.  Where the edge of each step is.  Use tools that help you move around (mobility aids) if they are needed. These include:  Canes.  Walkers.  Scooters.  Crutches.  Turn on the lights when you go into a dark area. Replace any light bulbs as soon as they burn out.  Set up your furniture so you have a clear path. Avoid moving your furniture around.  If any of your floors are uneven, fix them.  If there are any pets around you, be aware of where they are.  Review your medicines with your doctor. Some medicines can make you feel dizzy. This can increase your chance of falling. Ask your doctor what other things that you can do to help prevent falls. This information is not intended to replace advice given to you by your health care provider. Make sure you discuss any questions you have with your health care provider. Document Released: 08/19/2009 Document Revised: 03/30/2016 Document Reviewed: 11/27/2014 Elsevier Interactive Patient Education  2017 [employer].

## 2018-09-23 ENCOUNTER — Ambulatory Visit (INDEPENDENT_AMBULATORY_CARE_PROVIDER_SITE_OTHER): Payer: Medicare Other

## 2018-09-23 DIAGNOSIS — R55 Syncope and collapse: Secondary | ICD-10-CM | POA: Diagnosis not present

## 2018-09-23 NOTE — Progress Notes (Signed)
Carelink Summary Report / Loop Recorder 

## 2018-09-25 ENCOUNTER — Encounter: Payer: Self-pay | Admitting: Internal Medicine

## 2018-09-25 DIAGNOSIS — Z85828 Personal history of other malignant neoplasm of skin: Secondary | ICD-10-CM | POA: Diagnosis not present

## 2018-09-25 DIAGNOSIS — C44729 Squamous cell carcinoma of skin of left lower limb, including hip: Secondary | ICD-10-CM | POA: Diagnosis not present

## 2018-10-02 ENCOUNTER — Encounter: Payer: Self-pay | Admitting: Internal Medicine

## 2018-10-02 ENCOUNTER — Non-Acute Institutional Stay: Payer: Medicare Other | Admitting: Internal Medicine

## 2018-10-02 VITALS — BP 118/68 | HR 80 | Temp 97.6°F | Ht 66.0 in | Wt 128.0 lb

## 2018-10-02 DIAGNOSIS — M8949 Other hypertrophic osteoarthropathy, multiple sites: Secondary | ICD-10-CM

## 2018-10-02 DIAGNOSIS — M150 Primary generalized (osteo)arthritis: Secondary | ICD-10-CM

## 2018-10-02 DIAGNOSIS — K219 Gastro-esophageal reflux disease without esophagitis: Secondary | ICD-10-CM | POA: Insufficient documentation

## 2018-10-02 DIAGNOSIS — I4891 Unspecified atrial fibrillation: Secondary | ICD-10-CM | POA: Diagnosis not present

## 2018-10-02 DIAGNOSIS — D6869 Other thrombophilia: Secondary | ICD-10-CM

## 2018-10-02 DIAGNOSIS — Z23 Encounter for immunization: Secondary | ICD-10-CM

## 2018-10-02 DIAGNOSIS — M1990 Unspecified osteoarthritis, unspecified site: Secondary | ICD-10-CM | POA: Diagnosis not present

## 2018-10-02 DIAGNOSIS — R739 Hyperglycemia, unspecified: Secondary | ICD-10-CM | POA: Diagnosis not present

## 2018-10-02 DIAGNOSIS — I2510 Atherosclerotic heart disease of native coronary artery without angina pectoris: Secondary | ICD-10-CM | POA: Diagnosis not present

## 2018-10-02 DIAGNOSIS — I4821 Permanent atrial fibrillation: Secondary | ICD-10-CM | POA: Diagnosis not present

## 2018-10-02 DIAGNOSIS — M159 Polyosteoarthritis, unspecified: Secondary | ICD-10-CM

## 2018-10-02 MED ORDER — TETANUS-DIPHTH-ACELL PERTUSSIS 5-2-15.5 LF-MCG/0.5 IM SUSP: 0.5000 mL | Freq: Once | INTRAMUSCULAR | 0 refills | Status: AC

## 2018-10-02 NOTE — Progress Notes (Signed)
Location:  Occupational psychologist of Service:  Clinic (12)  Provider: Rainbow Salman L. Mariea Clonts, D.O., C.M.D.  Code Status: DNR, MOST Goals of Care:  Advanced Directives 09/10/2018  Does Patient Have a Medical Advance Directive? Yes  Type of Paramedic of [place];Out of facility DNR (pink MOST or yellow form)  Does patient want to make changes to medical advance directive? No - Patient declined  Copy of [place] in Chart? Yes - validated most recent copy scanned in chart (See row information)  Pre-existing out of facility DNR order (yellow form or pink MOST form) Yellow form placed in chart (order not valid for inpatient use)   Chief Complaint  Patient presents with  . Medical Management of Chronic Issues    24mth follow-up  . advance care planning    discuss MOST form    HPI: Patient is a 84 y.o. female seen today for medical management of chronic diseases.    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  She's been elevating her feet.  Her weight is down to 128 lbs which is 6 lbs down from 11/5.  It had been stable before that.  No significant changes in eating.  Not a big breakfast eater.  May have a muffin and fruit.  Sleeps in and eats breakfast at 10am, fruit at 2-3pm and later dinner.  This is not a big change though by her report.  Early September, her left knee flared up.  She didn't see anyone.  Thinks it may have been gout.  She used [name]'s cane hobbling around the house to prevent a fall.  It mostly just went away.  Says she also sometimes has the left hip hurt, then right will hurt and left ok, just moves around.  Hip also sore since sitting more and slouching down.  Then back starts to ache.  She admits she hasn't walked much since Sept b/c of the knee and gout and now her mohs surgery.    Dry mouth:  Has tried biotene, [facility], some lozenges, but still bothers her.  She also tries to  hydrate, but her urine has been gold lately.  Is not sure how much she's drinking--keeps ice water in the fridge.  At least 6 glasses per day of water.    Tear ducts--notices she gets dry accumulations on each eye. Thought it was more allergy related but that's not been an issue for months.  Recommended she discuss with Dr. Ellie Lunch.      She forgot to bring her MOST form along for the discussion today.  She will drop it back off.    tdap vaccine is due.    Has her loop recorder in place and probably will get a pacemaker next year per Dr Lovena Le.  Past Medical History:  Diagnosis Date  . Actinic keratosis 05/25/2014  . Anxiety   . Atrial fibrillation ([place]) 09/21/2010  . Basal cell carcinoma 05/25/2014   Multiple removed by Dr. Danella Sensing in March 2015: right neck, left neck, scalp   . Bell's palsy 07/18/1979  . Cervicalgia 01/31/2012  . Closed fracture of lumbar vertebra without mention of spinal cord injury 07/17/2005  . Conjunctiva disorder 12/26/2010  . Coronary atherosclerosis of native coronary artery 07/17/1997  . Cramp of limb 08/16/2009  . Degeneration of lumbar or lumbosacral intervertebral disc 01/31/2012  . Disturbance of skin sensation 08/2009  . Dizziness and giddiness 11/08/2011  vertigo  . External hemorrhoids without mention of complication 4/82/5003  . External hemorrhoids without mention of complication 70/48/8891  . Ganglion of tendon sheath 08/16/2009  . Leg cramp 10/27/2013   Most frequently the right leg.   . Long term (current) use of anticoagulants 09/2010  . Lumbago 07/2009  . Meralgia paresthetica 07/2007  . MI, old   . Myalgia and myositis, unspecified 01/17/2012  . Osteoporosis   . Other abnormal blood chemistry 04/08/2012  . Other abnormal blood chemistry 2013   hyperglycemia  . Other disorder of muscle, ligament, and fascia 04/08/2012  . Other specified cardiac dysrhythmias(427.89) 06/27/2010  . Pain in joint, ankle and foot 10/27/2013   Bilateral since 1998     . Pain in joint, shoulder region 08/12/2012  . Pain in joint, upper arm 12/26/2010  . Pain in limb 01/11/2011  . Pain in thoracic spine 01/31/2012  . 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 PVC's (premature ventricular contractions)   . Rash and other nonspecific skin eruption 08/02/2011  . Senile osteoporosis 07/17/1993  . Unspecified essential hypertension 07/17/1997  . Varicose veins of lower extremities 08/16/2009  . Varicose veins of lower extremities 08/16/2009    Past Surgical History:  Procedure Laterality Date  . COLONOSCOPY WITH PROPOFOL N/A 10/19/2015   Procedure: COLONOSCOPY WITH PROPOFOL;  Surgeon: Gatha Mayer, MD;  Location: [facility];  Service: Endoscopy;  Laterality: N/A;  . CORONARY ARTERY BYPASS GRAFT  1998   x2; LIMA to LAD; SVG to diagonal off bypass  . HEMORRHOID SURGERY  08/26/2012   Dr. Brantley Stage  . LOOP RECORDER INSERTION N/A 05/13/2018   Procedure: LOOP RECORDER INSERTION;  Surgeon: [name], MD;  Location: [facility];  Service: Cardiovascular;  Laterality: N/A;  . MASS EXCISION Left 11/23/2015   Procedure: LEFT WRIST EXCISION CYST;  Surgeon: Leanora Cover, MD;  Location: [facility];  Service: Orthopedics;  Laterality: Left;  . SKIN BIOPSY  01/29/14   (R) neck; (R) scalp, 2 (L) neck; shave biopsy Superficial basal cell carcinoma Dr. Danella Sensing  . TONSILLECTOMY  1940's    Allergies  Allergen Reactions  . Iodinated Diagnostic Agents Nausea Only and Other (See Comments)    Severe nausea and also passed out  . Bactrim Rash  . Relafen [Nabumetone] Rash    Outpatient Encounter Medications as of 10/02/2018  Medication Sig  . apixaban (ELIQUIS) 2.5 MG TABS tablet Take 1 tablet (2.5 mg total) by mouth 2 (two) times daily.  Marland Kitchen atenolol (TENORMIN) 25 MG tablet Take 1 tablet (25 mg total) by mouth daily.  . calcium carbonate (OS-CAL) 600 MG TABS Take 600 mg by mouth 2 (two) times daily with a meal.   . cholecalciferol (VITAMIN D)  1000 UNITS tablet Take 1,000 Units by mouth daily. For Vitamin D supplement.  . digoxin (LANOXIN) 0.125 MG tablet Take 1 tablet (125 mcg total) by mouth every other day.  . hydrochlorothiazide (HYDRODIURIL) 25 MG tablet TAKE 1 TABLET ONCE DAILY  . LORazepam (ATIVAN) 1 MG tablet Take one tablet by mouth at bedtime to decrease anxiety and help with sleep  . simvastatin (ZOCOR) 20 MG tablet Take 1 tablet (20 mg total) by mouth daily at 6 PM.   No facility-administered encounter medications on file as of 10/02/2018.     Review of Systems:  Review of Systems  Constitutional: Negative for chills and fever.  HENT: Positive for hearing loss. Negative for congestion.  Hearing aids  Eyes: Negative for blurred vision.       Glasses  Respiratory: Negative for shortness of breath.   Cardiovascular: Negative for chest pain, palpitations and leg swelling.  Gastrointestinal: Positive for heartburn. Negative for abdominal pain, blood in stool, constipation, diarrhea, melena, nausea and vomiting.       One episode of heartburn on Monday night; no recent bleeding complications form hemorrhoids  Genitourinary: Negative for dysuria.  Musculoskeletal: Positive for back pain and joint pain. Negative for falls, myalgias and neck pain.  Neurological: Negative for dizziness.  Endo/Heme/Allergies: Bruises/bleeds easily.    Health Maintenance  Topic Date Due  . TETANUS/TDAP  10/06/2017  . INFLUENZA VACCINE  Completed  . DEXA SCAN  Completed  . PNA vac Low Risk Adult  Completed    Physical Exam: Vitals:   10/02/18 1112  BP: 118/68  Pulse: 80  Temp: 97.6 F (36.4 C)  TempSrc: Oral  Weight: 128 lb (58.1 kg)  Height: 5\' 6"  (1.676 m)   Body mass index is 20.66 kg/m. Physical Exam  Constitutional: She is oriented to person, place, and time. She appears well-developed and well-nourished. No distress.  Cardiovascular: Intact distal pulses.  irreg irreg  Pulmonary/Chest: Effort normal and breath  sounds normal. She has no rales.  Abdominal: Bowel sounds are normal.  Musculoskeletal: Normal range of motion. She exhibits no tenderness.  Neurological: She is alert and oriented to person, place, and time.  Skin: Skin is warm and dry.  Dressing intact at site of mohs surgery on left shin  Psychiatric: She has a normal mood and affect.    Labs reviewed: Basic Metabolic Panel: Recent Labs    08/16/18 1502  NA 142  K 3.7  CL 95*  CO2 30*  GLUCOSE 106*  BUN 22  CREATININE 1.10*  CALCIUM 9.7   Liver Function Tests: No results for input(s): AST, ALT, ALKPHOS, BILITOT, PROT, ALBUMIN in the last 8760 hours. No results for input(s): LIPASE, AMYLASE in the last 8760 hours. No results for input(s): AMMONIA in the last 8760 hours. CBC: Recent Labs    08/16/18 1502  WBC 8.2  NEUTROABS 3.7  HGB 12.9  HCT 39.6  MCV 89  PLT 124*   Lipid Panel: No results for input(s): CHOL, HDL, LDLCALC, TRIG, CHOLHDL, LDLDIRECT in the last 8760 hours. Lab Results  Component Value Date   HGBA1C 6.2 09/12/2016    Assessment/Plan 1. Coronary artery disease involving native coronary artery of native heart without angina pectoris -no chest pains or sob, doing fine, cont secondary prevention with bp, lipid control  2. Permanent atrial fibrillation -rate is controlled, wearing a loop recorder and may need a pacemaker placed per Dr. Lovena Le when she gets through her skin cancer healing process and the holidays -continues on dig therapy and atenolol  3. Hypercoagulable state due to atrial fibrillation ([place]) -remains on eliquis low dose bid, tolerating well, had cbc, bmp with cardiology which were stable  4. Need for Tdap vaccination - Tdap (ADACEL) 03-07-14.5 LF-MCG/0.5 injection; Inject 0.5 mLs into the muscle once for 1 dose.  Dispense: 0.5 mL; Refill: 0--[facility] to [facility] so [facility]are will cover it  5. Primary osteoarthritis involving multiple joints -has known OA of neck,  knees, may use prn tylenol and very short term antiinflammatories, but mostly encouraged her to get back to walking as able s/p mohs on left leg for squamous cell ca  6. Inflammatory arthritis -suspect pseudogout in left knee,  but not a current issue, will r/o gout due to migratory nature of her arthritic pains with uric acid (though knee is an atypical location) next lab  7.  GERD:  Single episode--took tums and put an extra pillow under her head, stayed up a bit then fell asleep--no further episodes, but will monitor; educated on foods that may exacerbate this  8.  Hyperglycemia:  Check hba1c due to fasting glucose elevated  Labs/tests ordered:  Cbc with diff, cmp, dig level, lipid, uric acid and hba1c Next appt:  6 mos with fasting labs as above before  Elliot Meldrum L. Deangela Randleman, D.O. [facility] [address], [place] [address] Cell Phone (Mon-Fri 8am-5pm):  [phone] On Call:  [phone] & follow prompts after 5pm & weekends Office Phone:  [phone] Office Fax:  [phone]

## 2018-10-07 ENCOUNTER — Other Ambulatory Visit: Payer: Self-pay | Admitting: Internal Medicine

## 2018-10-15 ENCOUNTER — Telehealth: Payer: Self-pay | Admitting: *Deleted

## 2018-10-15 NOTE — Telephone Encounter (Signed)
Spoke with patient regarding tachy episode noted on LINQ from 10/14/18 at 1306, duration 8sec, median V rate 188bpm. ECG shows WCT. Patient denies symptoms with episode, reports compliance with all meds including atenolol, digoxin, and Eliquis. Advised I will review ECG with Dr. Lovena Le when he is in the office next and call her back if any recommended changes. She is agreeable to this plan.  Patient wishes to schedule PPM implant for January. Her daughter works part-time so she would like to know available dates to plan ahead. Will route message to Sonia Baller, Therapist, sports, for further assistance.

## 2018-10-16 NOTE — Telephone Encounter (Signed)
Dr. Lovena Le reviewed tachy episode and recommended f/u in office to discuss. Scheduler aware.

## 2018-10-21 ENCOUNTER — Other Ambulatory Visit: Payer: Self-pay | Admitting: Internal Medicine

## 2018-10-25 ENCOUNTER — Ambulatory Visit (INDEPENDENT_AMBULATORY_CARE_PROVIDER_SITE_OTHER): Payer: Medicare Other

## 2018-10-25 DIAGNOSIS — R55 Syncope and collapse: Secondary | ICD-10-CM | POA: Diagnosis not present

## 2018-10-25 NOTE — Progress Notes (Signed)
Carelink Summary Report / Loop Recorder 

## 2018-10-28 ENCOUNTER — Other Ambulatory Visit: Payer: Self-pay | Admitting: Internal Medicine

## 2018-10-29 ENCOUNTER — Other Ambulatory Visit: Payer: Self-pay | Admitting: Internal Medicine

## 2018-11-04 ENCOUNTER — Ambulatory Visit (INDEPENDENT_AMBULATORY_CARE_PROVIDER_SITE_OTHER): Payer: Medicare Other | Admitting: Internal Medicine

## 2018-11-04 ENCOUNTER — Encounter: Payer: Self-pay | Admitting: Internal Medicine

## 2018-11-04 VITALS — BP 132/70 | HR 93 | Ht 66.0 in | Wt 132.0 lb

## 2018-11-04 DIAGNOSIS — Z951 Presence of aortocoronary bypass graft: Secondary | ICD-10-CM | POA: Diagnosis not present

## 2018-11-04 DIAGNOSIS — I2510 Atherosclerotic heart disease of native coronary artery without angina pectoris: Secondary | ICD-10-CM | POA: Diagnosis not present

## 2018-11-04 DIAGNOSIS — R55 Syncope and collapse: Secondary | ICD-10-CM | POA: Diagnosis not present

## 2018-11-04 DIAGNOSIS — I4821 Permanent atrial fibrillation: Secondary | ICD-10-CM

## 2018-11-04 NOTE — Patient Instructions (Addendum)
Medication Instructions:  Your physician recommends that you continue on your current medications as directed. Please refer to the Current Medication list given to you today.  Labwork: None ordered.  Testing/Procedures: None ordered.  Follow-Up: Your physician wants you to follow-up in: 4 months with Dr. Lovena Le. You will receive a reminder letter in the mail two months in advance. If you don't receive a letter, please call our office to schedule the follow-up appointment.   Any Other Special Instructions Will Be Listed Below (If Applicable).  If you need a refill on your cardiac medications before your next appointment, please call your pharmacy.

## 2018-11-04 NOTE — Progress Notes (Signed)
HPI Susan Conway returns today for followup of her ILR. She is a pleasant 84 yo woman with a h/o syncope, PAF, s/p ILR with documented sinus node dysfunction, who has had asymptomatic pauses since her ILR was placed. I have offered her PPM insertion but she has refused. Several weeks ago she had an 8 second run of NSVT at 188/min. Since then, she has not had any spells of syncope or near syncope. Allergies  Allergen Reactions  . Iodinated Diagnostic Agents Nausea Only and Other (See Comments)    Severe nausea and also passed out  . Bactrim Rash  . Relafen [Nabumetone] Rash     Current Outpatient Medications  Medication Sig Dispense Refill  . atenolol (TENORMIN) 25 MG tablet Take 1 tablet (25 mg total) by mouth daily. 90 tablet 1  . calcium carbonate (OS-CAL) 600 MG TABS Take 600 mg by mouth 2 (two) times daily with a meal.     . cholecalciferol (VITAMIN D) 1000 UNITS tablet Take 1,000 Units by mouth daily. For Vitamin D supplement.    . digoxin (LANOXIN) 0.125 MG tablet Take 1 tablet (125 mcg total) by mouth every other day. 45 tablet 3  . ELIQUIS 2.5 MG TABS tablet TAKE 1 TABLET TWICE A DAY 180 tablet 1  . hydrochlorothiazide (HYDRODIURIL) 25 MG tablet TAKE 1 TABLET ONCE DAILY 90 tablet 1  . LORazepam (ATIVAN) 1 MG tablet Take one tablet by mouth at bedtime to decrease anxiety and help with sleep 90 tablet 1  . simvastatin (ZOCOR) 20 MG tablet Take 1 tablet (20 mg total) by mouth daily at 6 PM. 90 tablet 3   No current facility-administered medications for this visit.      Past Medical History:  Diagnosis Date  . Actinic keratosis 05/25/2014  . Anxiety   . Atrial fibrillation ([place]) 09/21/2010  . Basal cell carcinoma 05/25/2014   Multiple removed by Dr. Danella Sensing in March 2015: right neck, left neck, scalp   . Bell's palsy 07/18/1979  . Cervicalgia 01/31/2012  . Closed fracture of lumbar vertebra without mention of spinal cord injury 07/17/2005  . Conjunctiva disorder  12/26/2010  . Coronary atherosclerosis of native coronary artery 07/17/1997  . Cramp of limb 08/16/2009  . Degeneration of lumbar or lumbosacral intervertebral disc 01/31/2012  . Disturbance of skin sensation 08/2009  . Dizziness and giddiness 11/08/2011   vertigo  . External hemorrhoids without mention of complication 01/30/7123  . External hemorrhoids without mention of complication 58/07/9832  . Ganglion of tendon sheath 08/16/2009  . Leg cramp 10/27/2013   Most frequently the right leg.   . Long term (current) use of anticoagulants 09/2010  . Lumbago 07/2009  . Meralgia paresthetica 07/2007  . MI, old   . Myalgia and myositis, unspecified 01/17/2012  . Osteoporosis   . Other abnormal blood chemistry 04/08/2012  . Other abnormal blood chemistry 2013   hyperglycemia  . Other disorder of muscle, ligament, and fascia 04/08/2012  . Other specified cardiac dysrhythmias(427.89) 06/27/2010  . Pain in joint, ankle and foot 10/27/2013   Bilateral since 1998   . Pain in joint, shoulder region 08/12/2012  . Pain in joint, upper arm 12/26/2010  . Pain in limb 01/11/2011  . Pain in thoracic spine 01/31/2012  . 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 PVC's (premature ventricular contractions)   . Rash and other nonspecific skin eruption 08/02/2011  . Senile osteoporosis 07/17/1993  . Unspecified essential hypertension 07/17/1997  . Varicose veins  of lower extremities 08/16/2009  . Varicose veins of lower extremities 08/16/2009    ROS:   All systems reviewed and negative except as noted in the HPI.   Past Surgical History:  Procedure Laterality Date  . COLONOSCOPY WITH PROPOFOL N/A 10/19/2015   Procedure: COLONOSCOPY WITH PROPOFOL;  Surgeon: Gatha Mayer, MD;  Location: [facility];  Service: Endoscopy;  Laterality: N/A;  . CORONARY ARTERY BYPASS GRAFT  1998   x2; LIMA to LAD; SVG to diagonal off bypass  . HEMORRHOID SURGERY  08/26/2012   Dr. Brantley Stage  . LOOP RECORDER INSERTION N/A 05/13/2018    Procedure: LOOP RECORDER INSERTION;  Surgeon: [name], MD;  Location: [facility];  Service: Cardiovascular;  Laterality: N/A;  . MASS EXCISION Left 11/23/2015   Procedure: LEFT WRIST EXCISION CYST;  Surgeon: Leanora Cover, MD;  Location: [facility];  Service: Orthopedics;  Laterality: Left;  . SKIN BIOPSY  01/29/14   (R) neck; (R) scalp, 2 (L) neck; shave biopsy Superficial basal cell carcinoma Dr. Danella Sensing  . TONSILLECTOMY  1940's     Family History  Problem Relation Age of Onset  . Heart attack Father 67  . Heart disease Father   . Hypertension Sister   . Heart disease Sister   . Heart disease Brother      Social History   Socioeconomic History  . Marital status: Widowed    Spouse name: Not on file  . Number of children: Not on file  . Years of education: Not on file  . Highest education level: Not on file  Occupational History  . Occupation: Retired Tour manager  . Financial resource strain: Not hard at all  . Food insecurity:    Worry: Never true    Inability: Never true  . Transportation needs:    Medical: No    Non-medical: No  Tobacco Use  . Smoking status: Never Smoker  . Smokeless tobacco: Never Used  Substance and Sexual Activity  . Alcohol use: Yes    Comment: occasional wine  . Drug use: No  . Sexual activity: Never  Lifestyle  . Physical activity:    Days per week: 4 days    Minutes per session: 30 min  . Stress: Not at all  Relationships  . Social connections:    Talks on phone: More than three times a week    Gets together: More than three times a week    Attends religious service: More than 4 times per year    Active member of club or organization: No    Attends meetings of clubs or organizations: Never    Relationship status: Widowed  . Intimate partner violence:    Fear of current or ex partner: No    Emotionally abused: No    Physically abused: No    Forced sexual activity: No  Other Topics  Concern  . Not on file  Social History Narrative   Living at [facility] since [facility]   Widowed 09/03/2016   Never smoked   Alcohol occasional wine   Exercise, house work    DNR , POA, Living Will     BP 132/70   Pulse 93   Ht 5\' 6"  (1.676 m)   Wt 132 lb (59.9 kg)   BMI 21.31 kg/m   Physical Exam:  Well appearing NAD HEENT: Unremarkable Neck:  No JVD, no thyromegally Lymphatics:  No adenopathy Back:  No CVA tenderness Lungs:  Clear with  no wheezes HEART:  IRegular rate rhythm, no murmurs, no rubs, no clicks Abd:  soft, positive bowel sounds, no organomegally, no rebound, no guarding Ext:  2 plus pulses, no edema, no cyanosis, no clubbing Skin:  No rashes no nodules Neuro:  CN II through XII intact, motor grossly intact  EKG - atrial fib with a CVR, RBBB, and Left axis  DEVICE  Normal device function.  See PaceArt for details.   Assess/Plan: 1. PAF - she is more and more in atrial fib. Her rates are controlled. 2. Syncope - The etiology is unclear but I suspect due to pauses. She has not had any recently and I am recommending watchful waiting. If she has more than PPM would be recommended 3. NSVT - she has had a couple of these on her heart monitor. She is asymptomatic. No treatment 4. HTN - her blood pressure is controlled. No change in meds.  Mikle Bosworth.D.

## 2018-11-07 ENCOUNTER — Other Ambulatory Visit: Payer: Self-pay | Admitting: Internal Medicine

## 2018-11-10 LAB — CUP PACEART REMOTE DEVICE CHECK
Date Time Interrogation Session: 20191117171038
MDC IDC PG IMPLANT DT: 20190708

## 2018-11-23 ENCOUNTER — Other Ambulatory Visit: Payer: Self-pay | Admitting: Internal Medicine

## 2018-11-24 LAB — CUP PACEART REMOTE DEVICE CHECK
Date Time Interrogation Session: 20191220184030
MDC IDC PG IMPLANT DT: 20190708

## 2018-11-27 ENCOUNTER — Ambulatory Visit (INDEPENDENT_AMBULATORY_CARE_PROVIDER_SITE_OTHER): Payer: Medicare Other

## 2018-11-27 DIAGNOSIS — R55 Syncope and collapse: Secondary | ICD-10-CM

## 2018-11-28 LAB — CUP PACEART REMOTE DEVICE CHECK
MDC IDC PG IMPLANT DT: 20190708
MDC IDC SESS DTM: 20200122183713

## 2018-11-28 NOTE — Progress Notes (Signed)
Carelink Summary Report / Loop Recorder 

## 2018-12-19 ENCOUNTER — Telehealth: Payer: Self-pay

## 2018-12-19 NOTE — Telephone Encounter (Signed)
Spoke with patient informed her that she has not had any pause episodes since 07/24/2018 and no further tachy episodes since 10/14/2018.  She states she may call in periodically to check on what LINQ is showing.   Patient denies any other questions.

## 2018-12-19 NOTE — Telephone Encounter (Signed)
Pt going out of town for a few days and wanted to know if she should take her monitor. I advised her that if she was going to be gone for more than 3 days take the monitor with her. If she was not going to be gone for more than 3 days then she can leave the monitor home. The patient would like for the nurse to look at her latest transmission to see if she had any episodes since seeing Dr. Lovena Le in December. Pt wants the device nurse to give her a phone call back. Pt  phone number is [phone]. I told her it would be after 12:30 pm when the nurse can get back to her.

## 2018-12-19 NOTE — Telephone Encounter (Signed)
Through 12/19/18 at 00:05, no additional pause episodes detected by LINQ since 07/24/18. No further symptom or tachy episodes since most recent tachy episode on 10/14/18.

## 2018-12-30 ENCOUNTER — Telehealth: Payer: Self-pay

## 2018-12-30 ENCOUNTER — Telehealth: Payer: Self-pay | Admitting: Cardiology

## 2018-12-30 ENCOUNTER — Ambulatory Visit (INDEPENDENT_AMBULATORY_CARE_PROVIDER_SITE_OTHER): Payer: Medicare Other | Admitting: *Deleted

## 2018-12-30 DIAGNOSIS — R55 Syncope and collapse: Secondary | ICD-10-CM | POA: Diagnosis not present

## 2018-12-30 NOTE — Telephone Encounter (Signed)
If cardiology is on board with her stopping simvastatin at her age, I'm fine with this, as well.  Please remove from her med list and let her know (if it's not already off her med list).

## 2018-12-30 NOTE — Telephone Encounter (Signed)
Patient seen Cardiologist in December 2019 and mentioned calf pain. The cardiologist recommended patient stop simvastatin . Patient with improved symptoms since stopping medication. Patient plans to contact [facility] to have them remove this from her account yet she would like Dr.Reed's opinion first.    I updated medication list to reflect medication change  Patient states ok to leave a detailed message on her voicemail if no answer  Please advise

## 2018-12-30 NOTE — Telephone Encounter (Signed)
Patient wants to confirm if it is ok to stop taking Simvastatin. She stated that she has not taking it since December 2019. Explained to patient that she would need to contact Dr. Mariea Clonts for further instructions. Pt verbalized understanding.

## 2018-12-30 NOTE — Telephone Encounter (Signed)
Spoke with patient and advised results Med list updated

## 2018-12-31 LAB — CUP PACEART REMOTE DEVICE CHECK
Date Time Interrogation Session: 20200224191100
MDC IDC PG IMPLANT DT: 20190708

## 2019-01-07 NOTE — Progress Notes (Signed)
Carelink Summary Report / Loop Recorder 

## 2019-01-08 ENCOUNTER — Encounter: Payer: Self-pay | Admitting: Internal Medicine

## 2019-01-08 ENCOUNTER — Non-Acute Institutional Stay: Payer: Medicare Other | Admitting: Internal Medicine

## 2019-01-08 VITALS — BP 112/68 | Temp 97.7°F | Ht 66.0 in | Wt 131.0 lb

## 2019-01-08 DIAGNOSIS — M7989 Other specified soft tissue disorders: Secondary | ICD-10-CM | POA: Diagnosis not present

## 2019-01-08 DIAGNOSIS — M11262 Other chondrocalcinosis, left knee: Secondary | ICD-10-CM

## 2019-01-08 DIAGNOSIS — M25562 Pain in left knee: Secondary | ICD-10-CM

## 2019-01-08 DIAGNOSIS — M25462 Effusion, left knee: Secondary | ICD-10-CM | POA: Diagnosis not present

## 2019-01-08 NOTE — Progress Notes (Signed)
Location:  [facility] of Service:  Clinic (12)  Provider: Carrington Olazabal L. Mariea Clonts, D.O., C.M.D.  Goals of Care:  Advanced Directives 09/10/2018  Does Patient Have a Medical Advance Directive? Yes  Type of Paramedic of [place];Out of facility DNR (pink MOST or yellow form)  Does patient want to make changes to medical advance directive? No - Patient declined  Copy of [place] in Chart? Yes - validated most recent copy scanned in chart (See row information)  Pre-existing out of facility DNR order (yellow form or pink MOST form) Yellow form placed in chart (order not valid for inpatient use)     Chief Complaint  Patient presents with  . Acute Visit    left knee swollen x5 days    HPI: Patient is a 85 y.o. female seen today for an acute visit for left knee swelling for 5 days.  Pt slightly twisted the left knee and heard a pop sound at that time.  She has had pain medially and down into her calf and behind her knee since.  See more details as per student note.  She has not been walking as much due to pain.  She's used some tylenol, ice, and has elevated the leg.  She did not fall.    Past Medical History:  Diagnosis Date  . Actinic keratosis 05/25/2014  . Anxiety   . Atrial fibrillation ([place]) 09/21/2010  . Basal cell carcinoma 05/25/2014   Multiple removed by Dr. Danella Sensing in March 2015: right neck, left neck, scalp   . Bell's palsy 07/18/1979  . Cervicalgia 01/31/2012  . Closed fracture of lumbar vertebra without mention of spinal cord injury 07/17/2005  . Conjunctiva disorder 12/26/2010  . Coronary atherosclerosis of native coronary artery 07/17/1997  . Cramp of limb 08/16/2009  . Degeneration of lumbar or lumbosacral intervertebral disc 01/31/2012  . Disturbance of skin sensation 08/2009  . Dizziness and giddiness 11/08/2011   vertigo  . External hemorrhoids without mention of complication 6/71/2458  . External hemorrhoids without  mention of complication 09/98/3382  . Ganglion of tendon sheath 08/16/2009  . Leg cramp 10/27/2013   Most frequently the right leg.   . Long term (current) use of anticoagulants 09/2010  . Lumbago 07/2009  . Meralgia paresthetica 07/2007  . MI, old   . Myalgia and myositis, unspecified 01/17/2012  . Osteoporosis   . Other abnormal blood chemistry 04/08/2012  . Other abnormal blood chemistry 2013   hyperglycemia  . Other disorder of muscle, ligament, and fascia 04/08/2012  . Other specified cardiac dysrhythmias(427.89) 06/27/2010  . Pain in joint, ankle and foot 10/27/2013   Bilateral since 1998   . Pain in joint, shoulder region 08/12/2012  . Pain in joint, upper arm 12/26/2010  . Pain in limb 01/11/2011  . Pain in thoracic spine 01/31/2012  . 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 PVC's (premature ventricular contractions)   . Rash and other nonspecific skin eruption 08/02/2011  . Senile osteoporosis 07/17/1993  . Unspecified essential hypertension 07/17/1997  . Varicose veins of lower extremities 08/16/2009  . Varicose veins of lower extremities 08/16/2009    Past Surgical History:  Procedure Laterality Date  . COLONOSCOPY WITH PROPOFOL N/A 10/19/2015   Procedure: COLONOSCOPY WITH PROPOFOL;  Surgeon: Gatha Mayer, MD;  Location: [facility];  Service: Endoscopy;  Laterality: N/A;  . CORONARY ARTERY BYPASS GRAFT  1998   x2; LIMA to LAD; SVG to diagonal off bypass  .  HEMORRHOID SURGERY  08/26/2012   Dr. Brantley Stage  . LOOP RECORDER INSERTION N/A 05/13/2018   Procedure: LOOP RECORDER INSERTION;  Surgeon: [name], MD;  Location: [facility];  Service: Cardiovascular;  Laterality: N/A;  . MASS EXCISION Left 11/23/2015   Procedure: LEFT WRIST EXCISION CYST;  Surgeon: Leanora Cover, MD;  Location: [facility];  Service: Orthopedics;  Laterality: Left;  . SKIN BIOPSY  01/29/14   (R) neck; (R) scalp, 2 (L) neck; shave biopsy Superficial basal cell carcinoma Dr. Danella Sensing  . TONSILLECTOMY  1940's    Allergies  Allergen Reactions  . Iodinated Diagnostic Agents Nausea Only and Other (See Comments)    Severe nausea and also passed out  . Bactrim Rash  . Relafen [Nabumetone] Rash    Outpatient Encounter Medications as of 01/08/2019  Medication Sig  . atenolol (TENORMIN) 25 MG tablet TAKE 1 TABLET DAILY  . calcium carbonate (OS-CAL) 600 MG TABS Take 600 mg by mouth 2 (two) times daily with a meal.   . cholecalciferol (VITAMIN D) 1000 UNITS tablet Take 1,000 Units by mouth daily. For Vitamin D supplement.  . digoxin (LANOXIN) 0.125 MG tablet Take 1 tablet (125 mcg total) by mouth every other day.  Marland Kitchen ELIQUIS 2.5 MG TABS tablet TAKE 1 TABLET TWICE A DAY  . hydrochlorothiazide (HYDRODIURIL) 25 MG tablet TAKE 1 TABLET ONCE DAILY  . LORazepam (ATIVAN) 1 MG tablet Take one tablet by mouth at bedtime to decrease anxiety and help with sleep   No facility-administered encounter medications on file as of 01/08/2019.     Review of Systems:  Review of Systems  Constitutional: Negative for chills, fever and malaise/fatigue.  HENT: Positive for hearing loss.        Has hearing aids  Eyes:       Glasses  Respiratory: Negative for cough and shortness of breath.   Cardiovascular: Positive for leg swelling. Negative for chest pain and palpitations.  Gastrointestinal: Negative for abdominal pain, constipation and diarrhea.  Genitourinary: Negative for dysuria.  Musculoskeletal: Positive for joint pain. Negative for falls and myalgias.  Neurological: Negative for dizziness, loss of consciousness and headaches.  Endo/Heme/Allergies: Bruises/bleeds easily.  Psychiatric/Behavioral: Negative for depression and memory loss. The patient is not nervous/anxious and does not have insomnia.     Health Maintenance  Topic Date Due  . TETANUS/TDAP  10/06/2017  . INFLUENZA VACCINE  Completed  . DEXA SCAN  Completed  . PNA vac Low Risk Adult  Completed    Physical  Exam: Vitals:   01/08/19 1454  BP: 112/68  Temp: 97.7 F (36.5 C)  TempSrc: Oral  Weight: 131 lb (59.4 kg)  Height: 5\' 6"  (1.676 m)   Body mass index is 21.14 kg/m. Physical Exam Vitals signs reviewed.  Constitutional:      Appearance: Normal appearance. She is normal weight.  HENT:     Head: Normocephalic and atraumatic.  Cardiovascular:     Comments: irreg irreg Pulmonary:     Effort: Pulmonary effort is normal. No respiratory distress.     Breath sounds: Normal breath sounds. No wheezing, rhonchi or rales.  Musculoskeletal: Normal range of motion.        General: Swelling and tenderness present. No deformity.     Left lower leg: Edema present.     Comments: Left knee ROM normal, no increase in pain with techniques to assess medial and lateral meniscus and ligamental injury; no laxity noted; does have small effusion laterally  and edema (nonpitting) of left lower extremity vs right, no ecchymoses, is tender anteromedially but not over tibial plateau; also tender in posterior popliteal space  Skin:    General: Skin is warm and dry.     Coloration: Skin is pale.     Comments: Unchanged color  Neurological:     General: No focal deficit present.     Mental Status: She is alert and oriented to person, place, and time.  Psychiatric:        Mood and Affect: Mood normal.     Labs reviewed: Basic Metabolic Panel: Recent Labs    08/16/18 1502  NA 142  K 3.7  CL 95*  CO2 30*  GLUCOSE 106*  BUN 22  CREATININE 1.10*  CALCIUM 9.7   Liver Function Tests: No results for input(s): AST, ALT, ALKPHOS, BILITOT, PROT, ALBUMIN in the last 8760 hours. No results for input(s): LIPASE, AMYLASE in the last 8760 hours. No results for input(s): AMMONIA in the last 8760 hours. CBC: Recent Labs    08/16/18 1502  WBC 8.2  NEUTROABS 3.7  HGB 12.9  HCT 39.6  MCV 89  PLT 124*   Lipid Panel: No results for input(s): CHOL, HDL, LDLCALC, TRIG, CHOLHDL, LDLDIRECT in the last 8760  hours. Lab Results  Component Value Date   HGBA1C 6.2 09/12/2016    Procedures since last visit: No results found.  Assessment/Plan 1. Pain and swelling of left knee - ? Partial tear of meniscus or ligament vs baker's cyst rupture  - check xray first - DG Knee Complete 4 Views Left; Future -RICE reviewed for now  2. Calf swelling - ? Cyst rupture vs other injury; on eliquis making dvt much less likely, but did initially have injury 5 days ago and just slightly less mobile since - DG Knee Complete 4 Views Left; Future -if xrays negative, would pursue [place] of left leg/venous doppler to look for cyst rupture or DVT  3. Pseudogout of left knee - seen when xrays returned so started on prednisone dose pack for inflammation  - predniSONE (STERAPRED UNI-PAK 21 TAB) 10 MG (21) TBPK tablet; Use as directed  Dispense: 21 tablet; Refill: 0 -if not improving, call back  Labs/tests ordered:   Orders Placed This Encounter  Procedures  . DG Knee Complete 4 Views Left    Standing Status:   Future    Number of Occurrences:   1    Standing Expiration Date:   03/09/2020    Order Specific Question:   Reason for Exam (SYMPTOM  OR DIAGNOSIS REQUIRED)    Answer:   medial knee swelling and calf swelling after twisting knee    Order Specific Question:   Preferred imaging location?    Answer:   [facility]    Order Specific Question:   Radiology Contrast Protocol - do NOT remove file path    Answer:   [email or web address]    Next appt:  04/02/2019  Beauty Pless L. Rayna Brenner, D.O. [facility] [address], [place] [address] Cell Phone (Mon-Fri 8am-5pm):  [phone] On Call:  [phone] & follow prompts after 5pm & weekends Office Phone:  [phone] Office Fax:  [phone]

## 2019-01-08 NOTE — Progress Notes (Signed)
Subjective:     Patient ID: Susan Conway, female   DOB: January 09, 1929, 85 y.o.   MRN: [id number]  HPI  Pt is a 85 y.o. female who presents for medical management of chronic conditions.   Patients main complaint is left knee pain. Pain started 01/02/2019. She claims she twisted her knee and it has hurt ever since. Patient recalls a similar left knee injury in September, but it resolved in a few days. She claims her symptoms are worse than the last episode. She is currently taking tylenol, icing her knee, and elevating her leg. She is also using a cane to ambulate to prevent falls. She has been ambulating less since injury.  Patient has a history of cardiac conditions. She states she saw her cardiologist in December and has no new complaints. She is taking her medications daily and has not symptoms of chest pain, SOB, or lower leg edema. Since last visit, she has gained three pounds, todays weight is 131lbs.   Her appetite remains healthy and she is having regular bowel movements. She is also staying hydrated and drinks numerous glasses of water daily. She occasionally gets heartburn and will take tums.    She denies any depression or anxiety. She is sleeping well, but has to take an ativan tablet at times for insomnia. She remains social and leaves the house often.   No changes in vision, she has a eye appointment scheduled in July 2020.                    Review of Systems  Constitutional: Negative for activity change, appetite change, fatigue and fever.  HENT: Negative for dental problem and trouble swallowing.   Respiratory: Negative for cough, shortness of breath and wheezing.   Cardiovascular: Positive for leg swelling. Negative for chest pain and palpitations.  Gastrointestinal: Negative for abdominal distention, abdominal pain, constipation, diarrhea, nausea and vomiting.  Genitourinary: Negative for dysuria and hematuria.  Musculoskeletal: Positive for arthralgias, joint  swelling and myalgias. Negative for back pain.  Neurological: Negative for dizziness, syncope, weakness and light-headedness.  Psychiatric/Behavioral: Negative for agitation, confusion and sleep disturbance. The patient is not nervous/anxious.        Objective:   Physical Exam Vitals signs and nursing note reviewed.  Constitutional:      General: She is not in acute distress.    Appearance: Normal appearance. She is not ill-appearing.  HENT:     Head: Normocephalic and atraumatic.     Mouth/Throat:     Mouth: Mucous membranes are dry.  Cardiovascular:     Rate and Rhythm: Normal rate and regular rhythm.     Pulses: Normal pulses.     Heart sounds: Normal heart sounds. No murmur.  Pulmonary:     Effort: Pulmonary effort is normal. No respiratory distress.     Breath sounds: Normal breath sounds. No wheezing or rhonchi.  Abdominal:     General: Abdomen is flat. There is no distension.     Palpations: Abdomen is soft.     Tenderness: There is no abdominal tenderness. There is no guarding.  Musculoskeletal:        General: Swelling present.     Left lower leg: Edema present.     Comments: Left patella swollen and not painful with palpation. No erythema or bruising present. Knee is not warm to touch. Left lower leg swollen. Calf not painful or warm.   Neurological:     Mental Status: She is alert.  Assessment:    1. Coronary artery disease without angina pectoris Patient doing well with medication. No symptoms of chest pain. Followed by cardiology  2. Atrial fibrillation Rate is controlled with medication. Patient on Eliquis for DVT prophylaxis and dig and atenolol for rate control. Followed by Dr. Lovena Conway and may need pacemaker placed in future.   3. Left knee pain due to injury Knee is swollen and painful at times. Recommend taking a x-ray, and possibly a ultrasound if xray is inconclusive. Continue with tylenol for pain, rest and elevation.   4. Bakers cyst Left  knee pain is located posterior and lateral knee. Her left leg is also swollen. Could be a ruptured bakers cyst. Will do ultrasound if xray results inconclusive.            Plan:    Labs: Fasting labs: CBC, CMP, Dig level, Lipid panel, Uric acid and hgbA1C in May 2020  Left knee xray Continue with leg elevation and ice to knee for swelling Continue to use tylenol for knee pain Follow up if knee pain worsens or does not resolve in the next few days

## 2019-01-09 ENCOUNTER — Ambulatory Visit
Admission: RE | Admit: 2019-01-09 | Discharge: 2019-01-09 | Disposition: A | Discharge status: Home / Self Care | Payer: Medicare Other | Source: Ambulatory Visit | Attending: Internal Medicine | Admitting: Internal Medicine

## 2019-01-09 DIAGNOSIS — S83412A Sprain of medial collateral ligament of left knee, initial encounter: Secondary | ICD-10-CM | POA: Diagnosis not present

## 2019-01-09 DIAGNOSIS — M25562 Pain in left knee: Principal | ICD-10-CM

## 2019-01-09 DIAGNOSIS — M25462 Effusion, left knee: Secondary | ICD-10-CM

## 2019-01-09 DIAGNOSIS — M7989 Other specified soft tissue disorders: Secondary | ICD-10-CM

## 2019-01-09 IMAGING — DX DG KNEE COMPLETE 4+V*L*
4 series · 4 of 4 positions shown · non-contrast
Comparison: None

CLINICAL DATA: Medial knee and calf swelling after twisting knee 1
week ago, LEFT knee pain

EXAM:
LEFT KNEE - COMPLETE 4+ VIEW

[dg knee complete 4 views left (1 of 4)]
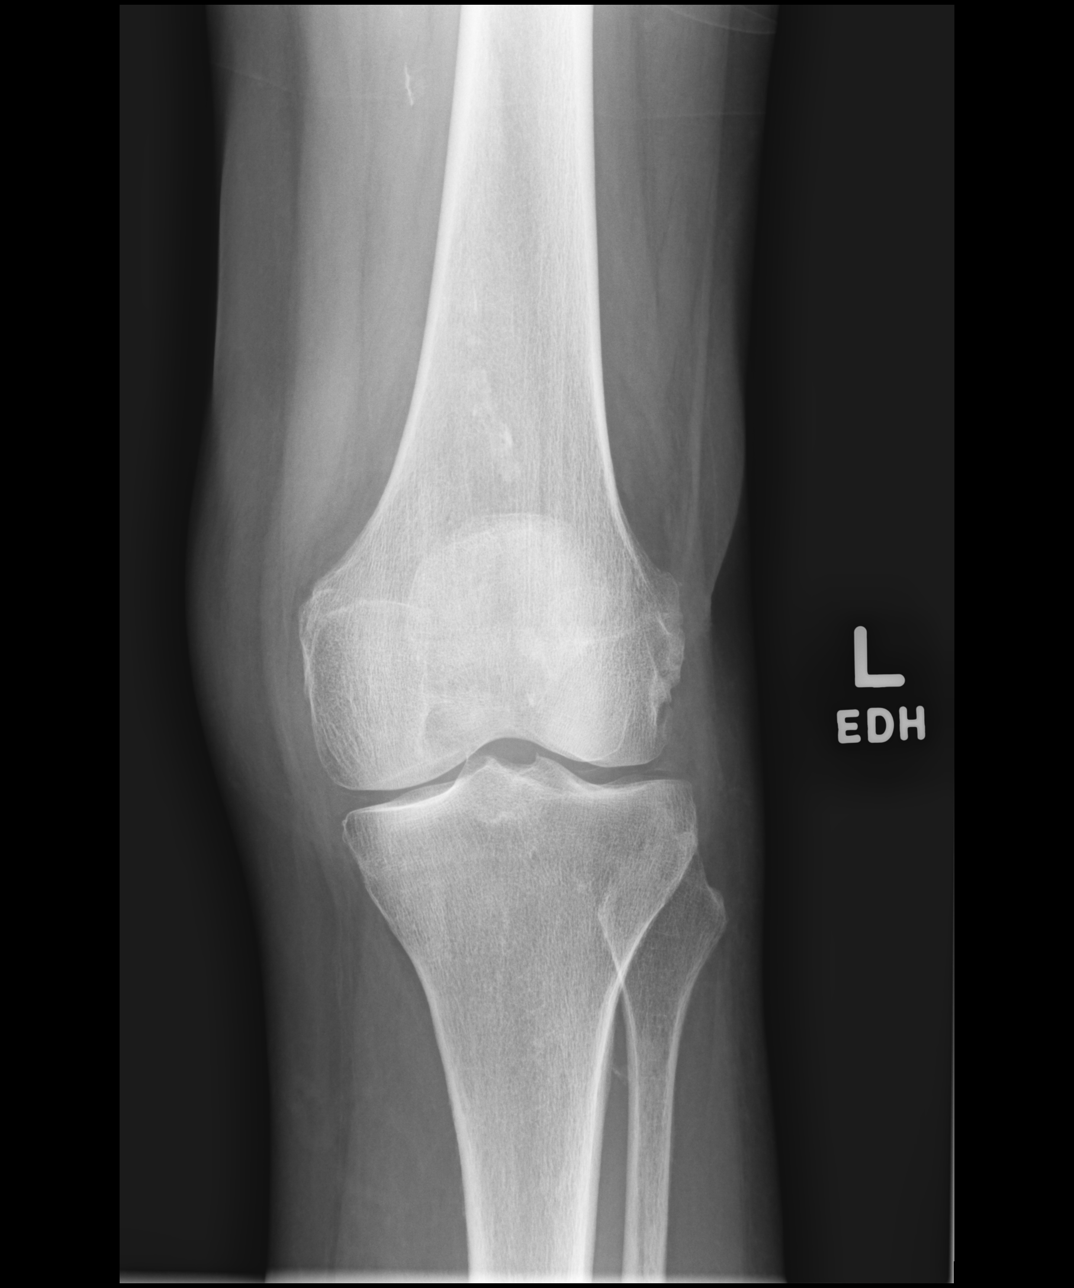

[dg knee complete 4 views left (2 of 4)]
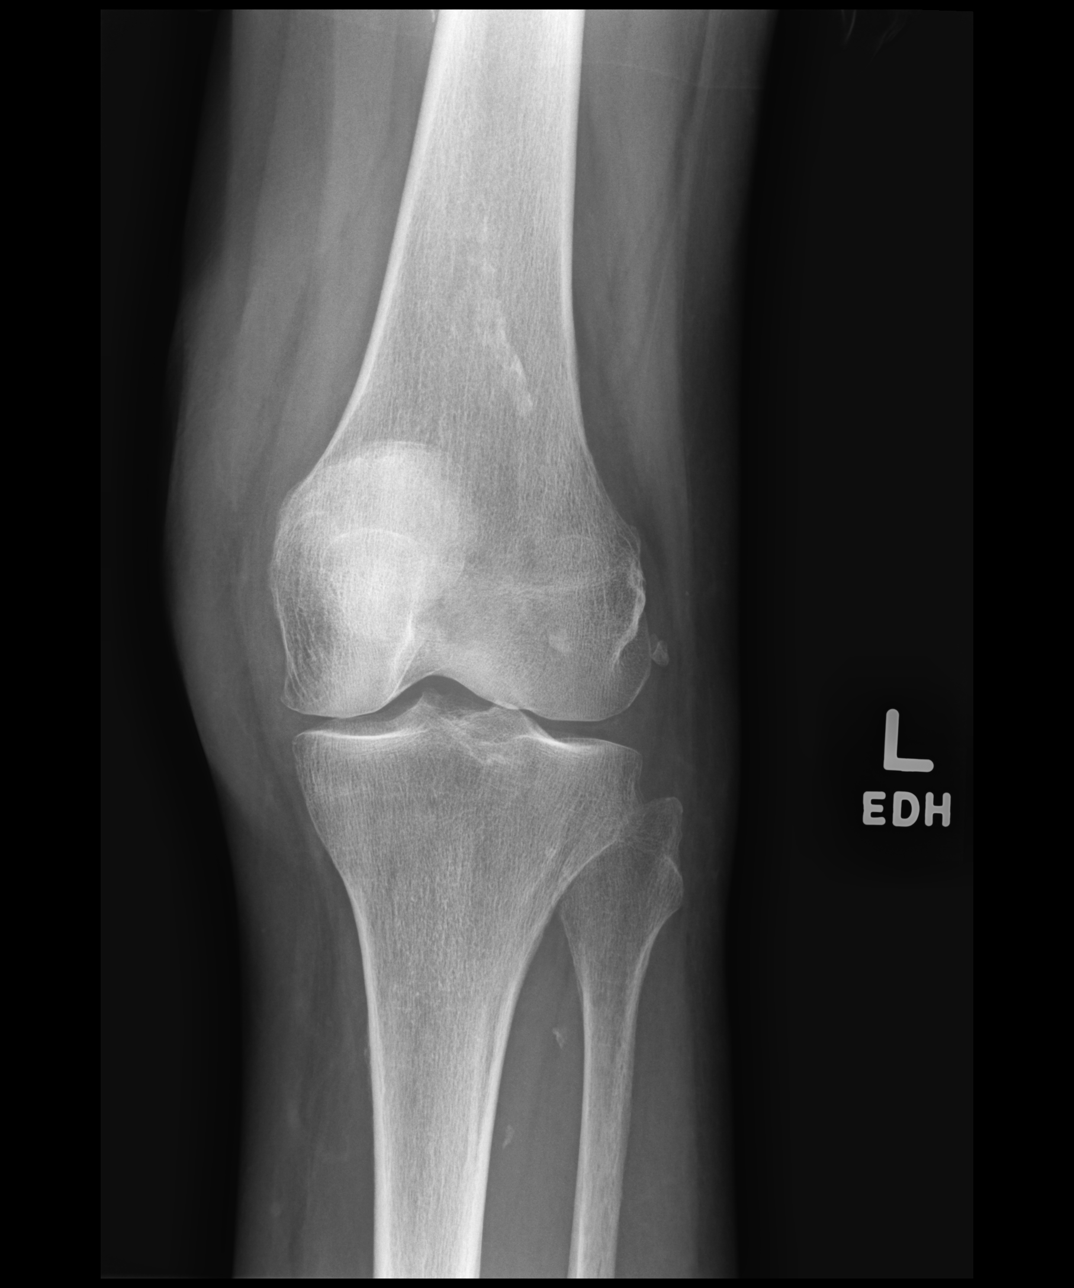

[dg knee complete 4 views left (3 of 4)]
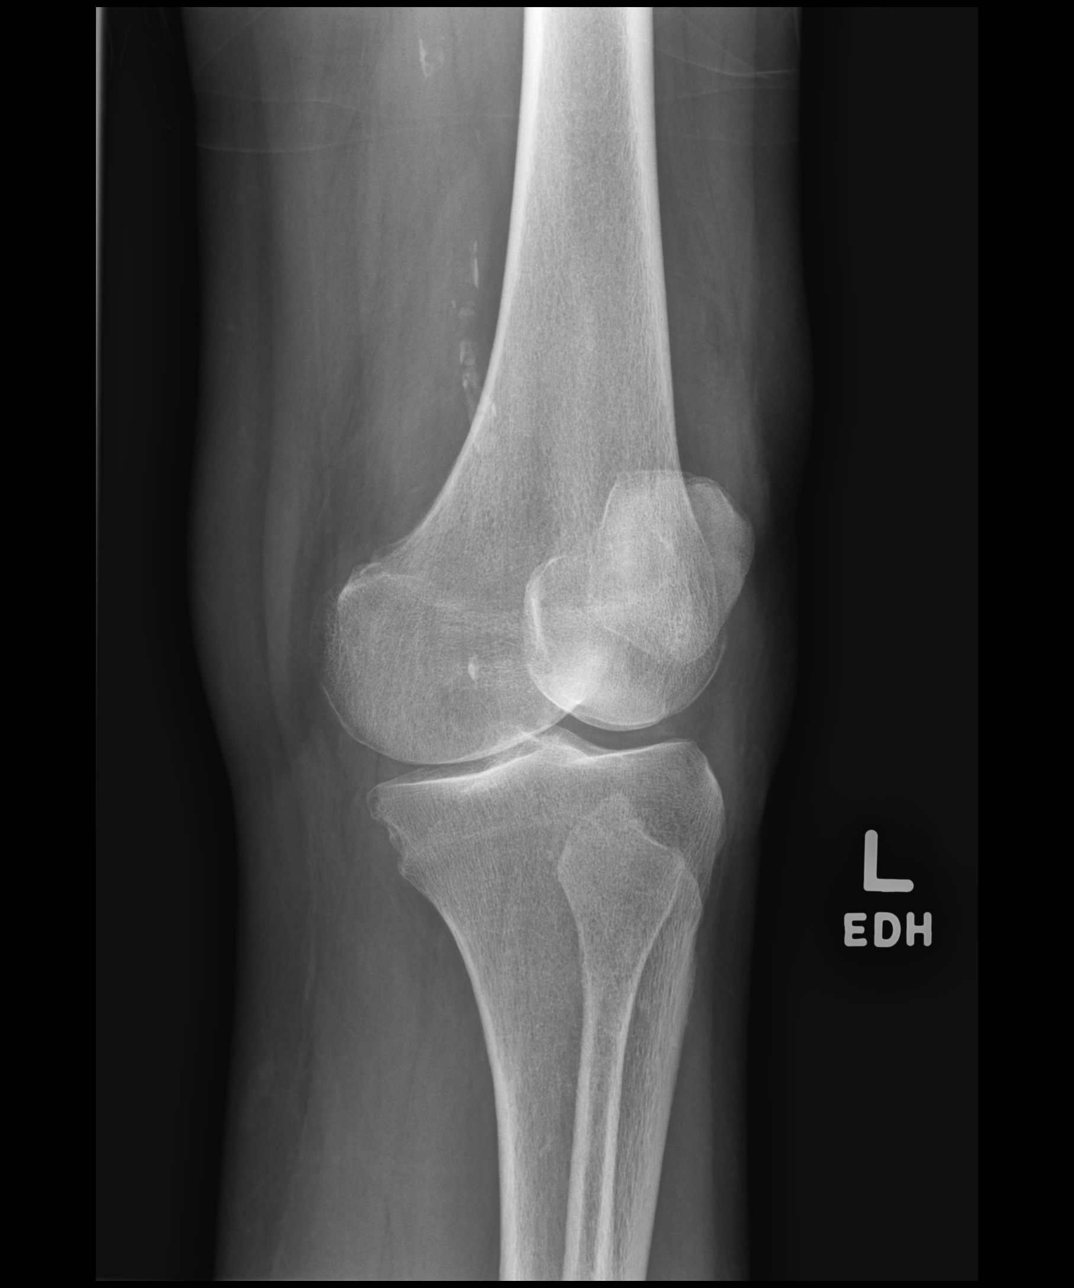

[dg knee complete 4 views left (4 of 4)]
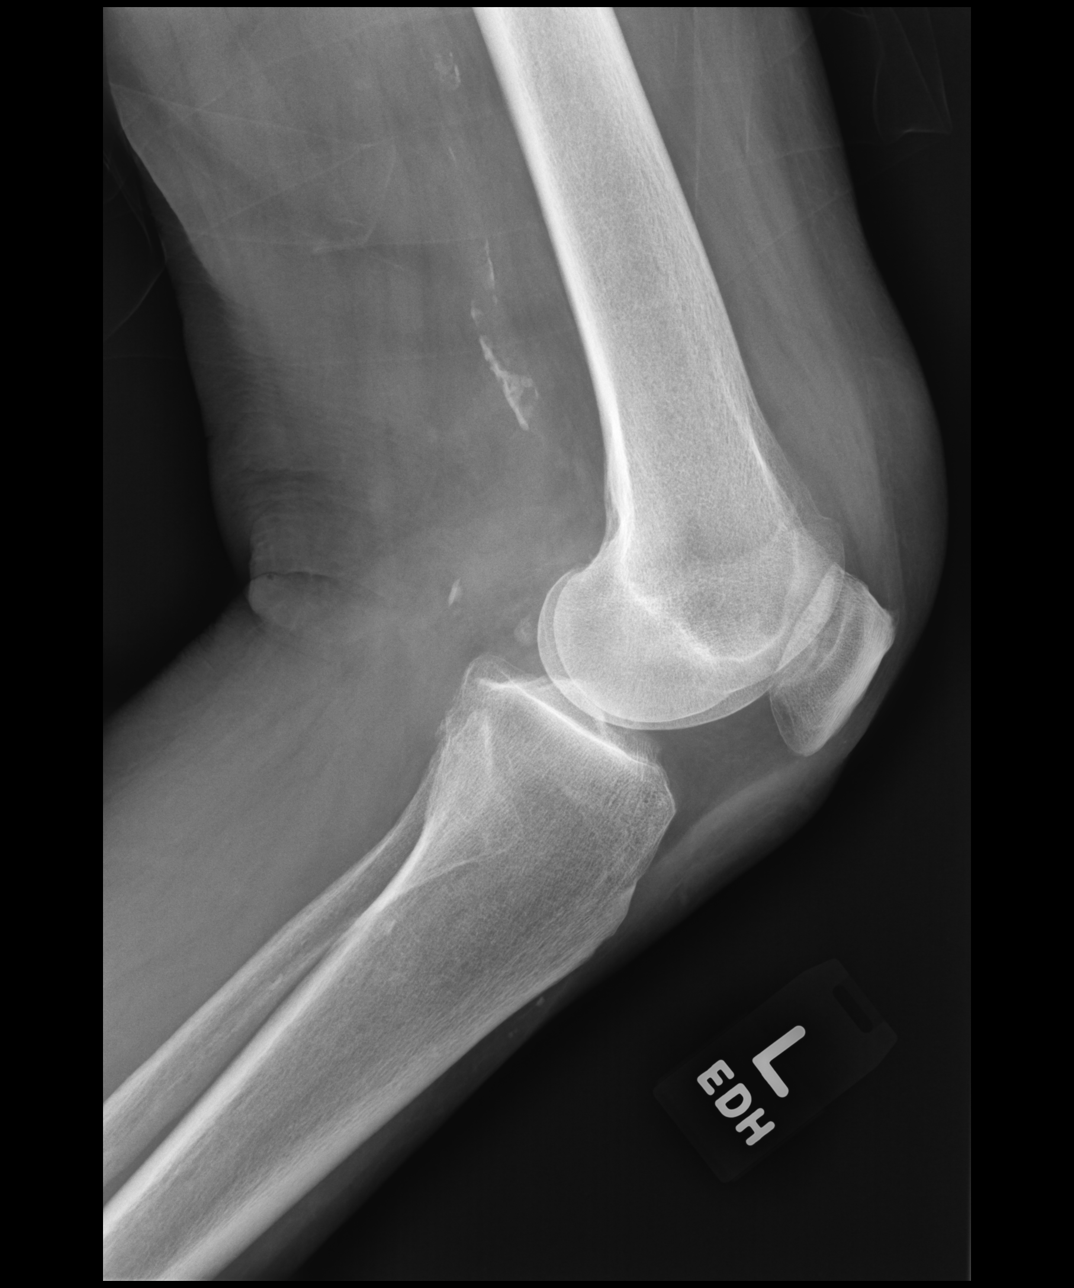

[4 of 4 positions shown; findings below may reference images not displayed]

FINDINGS: Osseous mineralization low normal.

Mild diffuse joint space narrowing.

Minimal chondrocalcinosis.

No acute fracture, dislocation, or bone destruction.

No knee joint effusion.

Scattered atherosclerotic calcifications.
IMPRESSION: Degenerative changes and chondrocalcinosis question CPPD LEFT knee.

No acute bony abnormalities.

## 2019-01-10 MED ORDER — PREDNISONE 10 MG (21) PO TBPK: ORAL_TABLET | ORAL | 0 refills | Status: DC

## 2019-01-14 ENCOUNTER — Other Ambulatory Visit: Payer: Self-pay | Admitting: Internal Medicine

## 2019-01-14 DIAGNOSIS — M11262 Other chondrocalcinosis, left knee: Secondary | ICD-10-CM

## 2019-01-14 DIAGNOSIS — M7989 Other specified soft tissue disorders: Secondary | ICD-10-CM

## 2019-01-14 DIAGNOSIS — M25462 Effusion, left knee: Secondary | ICD-10-CM

## 2019-01-14 DIAGNOSIS — M25562 Pain in left knee: Secondary | ICD-10-CM

## 2019-01-14 NOTE — Progress Notes (Signed)
Independent living clinic nurse came to me and had spoken with Mrs. Racz about her leg.  She is finishing her prednisone taper for pseudogout today, but still has considerable pain and swelling of the knee and calf area.  She has been on eliquis for her afib for some time.  She did have an injury to the knee, but has no worrisome signs of major meniscal or ligamental injury on exam.  Due to persistent swelling post-injury and initial popping, will obtain venous doppler to r/o DVT--this may also reveal a baker's cyst.    Djibril Glogowski L. Harsha Yusko, D.O. [facility] [address], [place] [address] Cell Phone (Mon-Fri 8am-5pm):  [phone] On Call:  [phone] & follow prompts after 5pm & weekends Office Phone:  [phone] Office Fax:  [phone]

## 2019-01-15 ENCOUNTER — Ambulatory Visit (HOSPITAL_COMMUNITY)
Admission: RE | Admit: 2019-01-15 | Discharge: 2019-01-15 | Disposition: A | Discharge status: Home / Self Care | Payer: Medicare Other | Source: Ambulatory Visit | Attending: Family | Admitting: Family

## 2019-01-15 ENCOUNTER — Encounter: Payer: Self-pay | Admitting: *Deleted

## 2019-01-15 ENCOUNTER — Other Ambulatory Visit: Payer: Self-pay

## 2019-01-15 DIAGNOSIS — M25562 Pain in left knee: Secondary | ICD-10-CM | POA: Diagnosis not present

## 2019-01-15 DIAGNOSIS — M11262 Other chondrocalcinosis, left knee: Secondary | ICD-10-CM | POA: Diagnosis not present

## 2019-01-15 DIAGNOSIS — M25462 Effusion, left knee: Secondary | ICD-10-CM | POA: Insufficient documentation

## 2019-01-15 DIAGNOSIS — M7989 Other specified soft tissue disorders: Secondary | ICD-10-CM | POA: Diagnosis not present

## 2019-02-03 ENCOUNTER — Ambulatory Visit (INDEPENDENT_AMBULATORY_CARE_PROVIDER_SITE_OTHER): Payer: Medicare Other | Admitting: *Deleted

## 2019-02-03 ENCOUNTER — Other Ambulatory Visit: Payer: Self-pay

## 2019-02-03 ENCOUNTER — Telehealth: Payer: Self-pay | Admitting: Cardiology

## 2019-02-03 DIAGNOSIS — R55 Syncope and collapse: Secondary | ICD-10-CM | POA: Diagnosis not present

## 2019-02-03 LAB — CUP PACEART REMOTE DEVICE CHECK
Date Time Interrogation Session: 20200328201116
MDC IDC PG IMPLANT DT: 20190708

## 2019-02-03 NOTE — Telephone Encounter (Signed)
Patient called and stated that she wanted to know if she had any high or low heart rates recordered on her ILR recently. I informed pt that the RN looks at the reports daily. If anything is abnormal she will receive a call other wise no news is good news.

## 2019-02-11 NOTE — Progress Notes (Signed)
Carelink Summary Report / Loop Recorder 

## 2019-03-06 ENCOUNTER — Ambulatory Visit (INDEPENDENT_AMBULATORY_CARE_PROVIDER_SITE_OTHER): Payer: Medicare Other | Admitting: *Deleted

## 2019-03-06 ENCOUNTER — Encounter: Payer: Medicare Other | Admitting: Internal Medicine

## 2019-03-06 ENCOUNTER — Other Ambulatory Visit: Payer: Self-pay

## 2019-03-06 DIAGNOSIS — I4821 Permanent atrial fibrillation: Secondary | ICD-10-CM

## 2019-03-06 DIAGNOSIS — R55 Syncope and collapse: Secondary | ICD-10-CM | POA: Diagnosis not present

## 2019-03-06 LAB — CUP PACEART REMOTE DEVICE CHECK
Date Time Interrogation Session: 20200430191545
Implantable Pulse Generator Implant Date: 20190708

## 2019-03-14 ENCOUNTER — Telehealth: Payer: Self-pay | Admitting: Internal Medicine

## 2019-03-14 NOTE — Telephone Encounter (Signed)
New message   Scheduled pt for doxemity phone call with Dr. Lovena Le. Pt phone number is in appt notes.     Virtual Visit Pre-Appointment Phone Call  "(Name), I am calling you today to discuss your upcoming appointment. We are currently trying to limit exposure to the virus that causes COVID-19 by seeing patients at home rather than in the office."  1. "What is the BEST phone number to call the day of the visit?" - include this in appointment notes  2. Do you have or have access to (through a family member/friend) a smartphone with video capability that we can use for your visit?" a. If yes - list this number in appt notes as cell (if different from BEST phone #) and list the appointment type as a VIDEO visit in appointment notes b. If no - list the appointment type as a PHONE visit in appointment notes  3. Confirm consent - "In the setting of the current Covid19 crisis, you are scheduled for a (phone or video) visit with your provider on (date) at (time).  Just as we do with many in-office visits, in order for you to participate in this visit, we must obtain consent.  If you'd like, I can send this to your mychart (if signed up) or email for you to review.  Otherwise, I can obtain your verbal consent now.  All virtual visits are billed to your insurance company just like a normal visit would be.  By agreeing to a virtual visit, we'd like you to understand that the technology does not allow for your provider to perform an examination, and thus may limit your provider's ability to fully assess your condition. If your provider identifies any concerns that need to be evaluated in person, we will make arrangements to do so.  Finally, though the technology is pretty good, we cannot assure that it will always work on either your or our end, and in the setting of a video visit, we may have to convert it to a phone-only visit.  In either situation, we cannot ensure that we have a secure connection.  Are  you willing to proceed?" STAFF: Did the patient verbally acknowledge consent to telehealth visit? Document YES/NO here: YES  4. Advise patient to be prepared - "Two hours prior to your appointment, go ahead and check your blood pressure, pulse, oxygen saturation, and your weight (if you have the equipment to check those) and write them all down. When your visit starts, your provider will ask you for this information. If you have an Apple Watch or Kardia device, please plan to have heart rate information ready on the day of your appointment. Please have a pen and paper handy nearby the day of the visit as well."  5. Give patient instructions for MyChart download to smartphone OR Doximity/Doxy.me as below if video visit (depending on what platform provider is using)  6. Inform patient they will receive a phone call 15 minutes prior to their appointment time (may be from unknown caller ID) so they should be prepared to answer    TELEPHONE CALL NOTE  Susan Conway has been deemed a candidate for a follow-up tele-health visit to limit community exposure during the Covid-19 pandemic. I spoke with the patient via phone to ensure availability of phone/video source, confirm preferred email & phone number, and discuss instructions and expectations.  I reminded Susan Conway to be prepared with any vital sign and/or heart rhythm information that could potentially be  obtained via home monitoring, at the time of her visit. I reminded Susan Conway to expect a phone call prior to her visit.  Susan Conway 03/14/2019 2:15 PM   INSTRUCTIONS FOR DOWNLOADING THE MYCHART APP TO SMARTPHONE  - The patient must first make sure to have activated MyChart and know their login information - If Apple, go to [employer] and type in MyChart in the search bar and download the app. If Android, ask patient to go to [employer] and type in [place] in the search bar and download the app. The app is free but as with any other  app downloads, their phone may require them to verify saved payment information or Apple/Android password.  - The patient will need to then log into the app with their MyChart username and password, and select [facility] as their healthcare provider to link the account. When it is time for your visit, go to the MyChart app, find appointments, and click Begin Video Visit. Be sure to Select Allow for your device to access the Microphone and Camera for your visit. You will then be connected, and your provider will be with you shortly.  **If they have any issues connecting, or need assistance please contact MyChart service desk [phone])**  **If using a computer, in order to ensure the best quality for their visit they will need to use either of the following Internet Browsers: [employer], or Google Chrome**  IF USING DOXIMITY or DOXY.ME - The patient will receive a link just prior to their visit by text.     FULL LENGTH CONSENT FOR TELE-HEALTH VISIT   I hereby voluntarily request, consent and authorize [facility] and its employed or contracted physicians, physician assistants, nurse practitioners or other licensed health care professionals (the Practitioner), to provide me with telemedicine health care services (the Services") as deemed necessary by the treating Practitioner. I acknowledge and consent to receive the Services by the Practitioner via telemedicine. I understand that the telemedicine visit will involve communicating with the Practitioner through live audiovisual communication technology and the disclosure of certain medical information by electronic transmission. I acknowledge that I have been given the opportunity to request an in-person assessment or other available alternative prior to the telemedicine visit and am voluntarily participating in the telemedicine visit.  I understand that I have the right to withhold or withdraw my consent to the use of  telemedicine in the course of my care at any time, without affecting my right to future care or treatment, and that the Practitioner or I may terminate the telemedicine visit at any time. I understand that I have the right to inspect all information obtained and/or recorded in the course of the telemedicine visit and may receive copies of available information for a reasonable fee.  I understand that some of the potential risks of receiving the Services via telemedicine include:   Delay or interruption in medical evaluation due to technological equipment failure or disruption;  Information transmitted may not be sufficient (e.g. poor resolution of images) to allow for appropriate medical decision making by the Practitioner; and/or   In rare instances, security protocols could fail, causing a breach of personal health information.  Furthermore, I acknowledge that it is my responsibility to provide information about my medical history, conditions and care that is complete and accurate to the best of my ability. I acknowledge that Practitioner's advice, recommendations, and/or decision may be based on factors not within their control, such  as incomplete or inaccurate data provided by me or distortions of diagnostic images or specimens that may result from electronic transmissions. I understand that the practice of medicine is not an exact science and that Practitioner makes no warranties or guarantees regarding treatment outcomes. I acknowledge that I will receive a copy of this consent concurrently upon execution via email to the email address I last provided but may also request a printed copy by calling the office of [facility].    I understand that my insurance will be billed for this visit.   I have read or had this consent read to me.  I understand the contents of this consent, which adequately explains the benefits and risks of the Services being provided via telemedicine.   I have been  provided ample opportunity to ask questions regarding this consent and the Services and have had my questions answered to my satisfaction.  I give my informed consent for the services to be provided through the use of telemedicine in my medical care  By participating in this telemedicine visit I agree to the above.

## 2019-03-17 NOTE — Progress Notes (Signed)
Carelink Summary Report / Loop Recorder 

## 2019-03-20 ENCOUNTER — Telehealth (INDEPENDENT_AMBULATORY_CARE_PROVIDER_SITE_OTHER): Payer: Medicare Other | Admitting: Internal Medicine

## 2019-03-20 ENCOUNTER — Other Ambulatory Visit: Payer: Self-pay

## 2019-03-20 DIAGNOSIS — R55 Syncope and collapse: Secondary | ICD-10-CM

## 2019-03-20 DIAGNOSIS — I2510 Atherosclerotic heart disease of native coronary artery without angina pectoris: Secondary | ICD-10-CM

## 2019-03-20 DIAGNOSIS — I4821 Permanent atrial fibrillation: Secondary | ICD-10-CM

## 2019-03-20 NOTE — Progress Notes (Signed)
Electrophysiology TeleHealth Note   Due to national recommendations of social distancing due to COVID 19, an audio/video telehealth visit is felt to be most appropriate for this patient at this time.  See MyChart message from today for the patient's consent to telehealth for [facility]   Date:  03/20/2019   ID:  Susan Conway, DOB 19-Sep-1929, MRN [id number]  Location: patient's home  Provider location: [address], [place]  Evaluation Performed: Follow-up visit  PCP:  [name], DO  Cardiologist:  No primary care provider on file.  Electrophysiologist:  Dr Lovena Le  Chief Complaint: " I think I'm doing ok."  History of Present Illness:    Susan Conway is a 85 y.o. female who presents via audio/video conferencing for a telehealth visit today. She is a pleasant 85 yo woman with CAD, s/p CABG, persistent atrial fib, post termination pauses and syncope discovered on her ILR, who has refused PPM insertion in the past. Since last being seen in our clinic, the patient reports doing very well.  Today, she denies symptoms of palpitations, chest pain, shortness of breath,  lower extremity edema, dizziness, presyncope, or syncope.  The patient is otherwise without complaint today.  The patient denies symptoms of fevers, chills, cough, or new SOB worrisome for COVID 19.  Past Medical History:  Diagnosis Date  . Actinic keratosis 05/25/2014  . Anxiety   . Atrial fibrillation ([place]) 09/21/2010  . Basal cell carcinoma 05/25/2014   Multiple removed by Dr. Danella Sensing in March 2015: right neck, left neck, scalp   . Bell's palsy 07/18/1979  . Cervicalgia 01/31/2012  . Closed fracture of lumbar vertebra without mention of spinal cord injury 07/17/2005  . Conjunctiva disorder 12/26/2010  . Coronary atherosclerosis of native coronary artery 07/17/1997  . Cramp of limb 08/16/2009  . Degeneration of lumbar or lumbosacral intervertebral disc 01/31/2012  . Disturbance of skin sensation  08/2009  . Dizziness and giddiness 11/08/2011   vertigo  . External hemorrhoids without mention of complication 7/67/3419  . External hemorrhoids without mention of complication 37/90/2409  . Ganglion of tendon sheath 08/16/2009  . Leg cramp 10/27/2013   Most frequently the right leg.   . Long term (current) use of anticoagulants 09/2010  . Lumbago 07/2009  . Meralgia paresthetica 07/2007  . MI, old   . Myalgia and myositis, unspecified 01/17/2012  . Osteoporosis   . Other abnormal blood chemistry 04/08/2012  . Other abnormal blood chemistry 2013   hyperglycemia  . Other disorder of muscle, ligament, and fascia 04/08/2012  . Other specified cardiac dysrhythmias(427.89) 06/27/2010  . Pain in joint, ankle and foot 10/27/2013   Bilateral since 1998   . Pain in joint, shoulder region 08/12/2012  . Pain in joint, upper arm 12/26/2010  . Pain in limb 01/11/2011  . Pain in thoracic spine 01/31/2012  . 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 PVC's (premature ventricular contractions)   . Rash and other nonspecific skin eruption 08/02/2011  . Senile osteoporosis 07/17/1993  . Unspecified essential hypertension 07/17/1997  . Varicose veins of lower extremities 08/16/2009  . Varicose veins of lower extremities 08/16/2009    Past Surgical History:  Procedure Laterality Date  . COLONOSCOPY WITH PROPOFOL N/A 10/19/2015   Procedure: COLONOSCOPY WITH PROPOFOL;  Surgeon: Gatha Mayer, MD;  Location: [facility];  Service: Endoscopy;  Laterality: N/A;  . CORONARY ARTERY BYPASS GRAFT  1998   x2; LIMA to LAD; SVG to diagonal off  bypass  . HEMORRHOID SURGERY  08/26/2012   Dr. Brantley Stage  . LOOP RECORDER INSERTION N/A 05/13/2018   Procedure: LOOP RECORDER INSERTION;  Surgeon: [name], MD;  Location: [facility];  Service: Cardiovascular;  Laterality: N/A;  . MASS EXCISION Left 11/23/2015   Procedure: LEFT WRIST EXCISION CYST;  Surgeon: Leanora Cover, MD;  Location: [facility];   Service: Orthopedics;  Laterality: Left;  . SKIN BIOPSY  01/29/14   (R) neck; (R) scalp, 2 (L) neck; shave biopsy Superficial basal cell carcinoma Dr. Danella Sensing  . TONSILLECTOMY  1940's    Current Outpatient Medications  Medication Sig Dispense Refill  . atenolol (TENORMIN) 25 MG tablet TAKE 1 TABLET DAILY 90 tablet 1  . calcium carbonate (OS-CAL) 600 MG TABS Take 600 mg by mouth 2 (two) times daily with a meal.     . cholecalciferol (VITAMIN D) 1000 UNITS tablet Take 1,000 Units by mouth daily. For Vitamin D supplement.    . digoxin (LANOXIN) 0.125 MG tablet Take 1 tablet (125 mcg total) by mouth every other day. 45 tablet 3  . ELIQUIS 2.5 MG TABS tablet TAKE 1 TABLET TWICE A DAY 180 tablet 1  . hydrochlorothiazide (HYDRODIURIL) 25 MG tablet TAKE 1 TABLET ONCE DAILY 90 tablet 1  . LORazepam (ATIVAN) 1 MG tablet Take one tablet by mouth at bedtime to decrease anxiety and help with sleep 90 tablet 1  . predniSONE (STERAPRED UNI-PAK 21 TAB) 10 MG (21) TBPK tablet Use as directed 21 tablet 0   No current facility-administered medications for this visit.     Allergies:   Iodinated diagnostic agents; Bactrim; and Relafen [nabumetone]   Social History:  The patient  reports that she has never smoked. She has never used smokeless tobacco. She reports current alcohol use. She reports that she does not use drugs.   Family History:  The patient's  family history includes Heart attack (age of onset: 32) in her father; Heart disease in her brother, father, and sister; Hypertension in her sister.   ROS:  Please see the history of present illness.   All other systems are personally reviewed and negative.    Exam:    Vital Signs:  none   Labs/Other Tests and Data Reviewed:    Recent Labs: 08/16/2018: BUN 22; Creatinine, Ser 1.10; Hemoglobin 12.9; Platelets 124; Potassium 3.7; Sodium 142   Wt Readings from Last 3 Encounters:  01/08/19 131 lb (59.4 kg)  11/04/18 132 lb (59.9 kg)  10/02/18  128 lb (58.1 kg)     Other studies personally reviewed: Additional studies/ records that were reviewed today include:   Last device remote is reviewed from [facility] PDF dated 4/30 which reveals normal device function, no arrhythmias except for atrial fib   ASSESSMENT & PLAN:    1.  Persistent atrial fib - she has been essentially asymptomatic. She will undergo watchful waiting 2. Symptomatic sinus node dysfunction - she has been asymptomatic. She will undergo watchful waiting. 3. ILR - she is out of rhythm about 20% of the time.  4. COVID 19 screen The patient denies symptoms of COVID 19 at this time.  The importance of social distancing was discussed today.  Follow-up:  6 months Next remote: 5/30  Current medicines are reviewed at length with the patient today.   The patient does not have concerns regarding her medicines.  The following changes were made today:  none  Labs/ tests ordered today include: none No orders  of the defined types were placed in this encounter.    Patient Risk:  after full review of this patients clinical status, I feel that they are at moderate risk at this time.  Today, I have spent 15 minutes with the patient with telehealth technology discussing her atrial fib and sinus node dysfunction.    Signed, Cristopher Peru, MD  03/20/2019 9:54 AM     [address] [place] [address] [phone] (office) [phone] (fax)

## 2019-03-25 ENCOUNTER — Encounter: Payer: Self-pay | Admitting: Internal Medicine

## 2019-03-25 ENCOUNTER — Telehealth: Payer: Self-pay | Admitting: Cardiology

## 2019-03-25 DIAGNOSIS — E785 Hyperlipidemia, unspecified: Secondary | ICD-10-CM | POA: Diagnosis not present

## 2019-03-25 DIAGNOSIS — D649 Anemia, unspecified: Secondary | ICD-10-CM | POA: Diagnosis not present

## 2019-03-25 DIAGNOSIS — E119 Type 2 diabetes mellitus without complications: Secondary | ICD-10-CM | POA: Diagnosis not present

## 2019-03-25 DIAGNOSIS — R739 Hyperglycemia, unspecified: Secondary | ICD-10-CM | POA: Diagnosis not present

## 2019-03-25 DIAGNOSIS — I4891 Unspecified atrial fibrillation: Secondary | ICD-10-CM | POA: Diagnosis not present

## 2019-03-25 DIAGNOSIS — I10 Essential (primary) hypertension: Secondary | ICD-10-CM | POA: Diagnosis not present

## 2019-03-25 DIAGNOSIS — M1990 Unspecified osteoarthritis, unspecified site: Secondary | ICD-10-CM | POA: Diagnosis not present

## 2019-03-25 LAB — CBC AND DIFFERENTIAL
HCT: 39 (ref 36–46)
Hemoglobin: 13.1 (ref 12.0–16.0)
Platelets: 106 — AB (ref 150–399)
WBC: 4.6

## 2019-03-25 LAB — HEPATIC FUNCTION PANEL
ALT: 10 (ref 7–35)
AST: 20 (ref 13–35)
Alkaline Phosphatase: 68 (ref 25–125)
Bilirubin, Total: 0.6

## 2019-03-25 LAB — BASIC METABOLIC PANEL
BUN: 26 — AB (ref 4–21)
Creatinine: 1.1 (ref 0.5–1.1)
Glucose: 91
Potassium: 3.4 (ref 3.4–5.3)
Sodium: 141 (ref 137–147)

## 2019-03-25 LAB — LIPID PANEL
Cholesterol: 170 (ref 0–200)
HDL: 53 (ref 35–70)
LDL Cholesterol: 106
Triglycerides: 54 (ref 40–160)

## 2019-03-25 LAB — HEMOGLOBIN A1C: Hemoglobin A1C: 6

## 2019-03-25 NOTE — Telephone Encounter (Signed)
Patient called and reported that she spoke w/ GT last week and he had asked how her VS had been. she went to the nurse at the facility today and wanted to report her VS to GT.  BP 120/70 HR 85 O2 was 98%  I informed her I would send note to MD and MD nurse. She verbalized understanding.

## 2019-03-26 NOTE — Telephone Encounter (Signed)
Noted.  VS are good.

## 2019-04-01 ENCOUNTER — Other Ambulatory Visit: Payer: Self-pay | Admitting: Internal Medicine

## 2019-04-01 ENCOUNTER — Encounter: Payer: Self-pay | Admitting: Internal Medicine

## 2019-04-01 NOTE — Progress Notes (Signed)
A user error has taken place: encounter opened in error, closed for administrative reasons.

## 2019-04-02 ENCOUNTER — Encounter: Payer: Self-pay | Admitting: Internal Medicine

## 2019-04-02 ENCOUNTER — Telehealth: Payer: Self-pay | Admitting: Internal Medicine

## 2019-04-02 ENCOUNTER — Non-Acute Institutional Stay: Payer: Medicare Other | Admitting: Internal Medicine

## 2019-04-02 ENCOUNTER — Other Ambulatory Visit: Payer: Self-pay

## 2019-04-02 VITALS — BP 132/80 | HR 72 | Temp 98.5°F | Ht 66.0 in | Wt 130.0 lb

## 2019-04-02 DIAGNOSIS — D6869 Other thrombophilia: Secondary | ICD-10-CM

## 2019-04-02 DIAGNOSIS — I4891 Unspecified atrial fibrillation: Secondary | ICD-10-CM | POA: Diagnosis not present

## 2019-04-02 DIAGNOSIS — M65332 Trigger finger, left middle finger: Secondary | ICD-10-CM | POA: Diagnosis not present

## 2019-04-02 DIAGNOSIS — M159 Polyosteoarthritis, unspecified: Secondary | ICD-10-CM

## 2019-04-02 DIAGNOSIS — I4821 Permanent atrial fibrillation: Secondary | ICD-10-CM | POA: Diagnosis not present

## 2019-04-02 DIAGNOSIS — M8949 Other hypertrophic osteoarthropathy, multiple sites: Secondary | ICD-10-CM

## 2019-04-02 DIAGNOSIS — M150 Primary generalized (osteo)arthritis: Secondary | ICD-10-CM | POA: Diagnosis not present

## 2019-04-02 DIAGNOSIS — I2510 Atherosclerotic heart disease of native coronary artery without angina pectoris: Secondary | ICD-10-CM

## 2019-04-02 DIAGNOSIS — M65331 Trigger finger, right middle finger: Secondary | ICD-10-CM

## 2019-04-02 NOTE — Telephone Encounter (Signed)
Spoke with the patient, she stated this morning at 7:30 am she was lying down in bed and felt a odd feeling in her left side of her chest. She described it as a slight sensation. She denied it being chest pain or palpitation or flutter. She said it only lasted 10 seconds and has not occurred again. She stated yesterday she was working on her patio and that could have been the cause. She stated she was told to advised Dr. Lovena Le if anything occurs so she wanted our office to know. I advised the patient to monitor the situation and contact us if the symptom persists or worsens.

## 2019-04-02 NOTE — Progress Notes (Signed)
Location:  [facility] clinic Provider:  Mirabelle Cyphers L. Mariea Clonts, D.O., C.M.D.  Code Status: DNR Goals of Care:  Advanced Directives 09/10/2018  Does Patient Have a Medical Advance Directive? Yes  Type of Paramedic of [place];Out of facility DNR (pink MOST or yellow form)  Does patient want to make changes to medical advance directive? No - Patient declined  Copy of [place] in Chart? Yes - validated most recent copy scanned in chart (See row information)  Pre-existing out of facility DNR order (yellow form or pink MOST form) Yellow form placed in chart (order not valid for inpatient use)     Chief Complaint  Patient presents with  . Medical Management of Chronic Issues    [facility] follow-up    HPI: Patient is a 85y.o. female seen today for medical management of chronic diseases.    She had called cardiology this morning when she got up due to a strange feeling in her left chest that she notice a bit after waking up around 7:30am.  It was not painful, but different.  She had no other symptoms, the feeling did not radiate anywhere and it only lasted for several seconds.  She says it did not scare her, but she did wonder what it was.  She had no overt chest pain, no shortness of breath, no palpitations.  She has her implanted loop recorder in place and did not get any calls in reference to it.  She had not heard back from cardiology when we met, but had a morning appt here.  She feels just fine.  Reports that when she went to cardiology last, they decided on watchful waiting rather than pacer/defibrillator installation.    We discussed her episode with her left knee pain and swelling.  Turned out she had a ruptured baker's cysts which was in the differential when I saw her.  It had been painful and swelled all the one down her leg eventually after each side of her knee had been individually swollen.  She had the xray that showed some calcifications and I suspected  pseudogout and treated her with prednisone.  Then the swelling moved down the leg and we ordered an [place]ore[place]hich was negative for DVT.  She's had mild residual swelling in the ankle area distal to her medial malleolus.  She didn't walk last week, but did the last two days.  She also had a very busy day yesterday with cleaning of her patio chairs and hosing down the patio.    She asks if toenail fungus can cause blood poisoning--we talked about increased risk of cellulitis with fungal nails and that can lead to sepsis if not treated.  She does report having cellulitis many years ago when her suitcase banged into the front of [place] in the airport.  She continues with her right middle finger trigger finger.  Her left middle finger was injured when she fell out of a papasan chair at a family member's home several months ago.  It won't bend fully since and stays a little sore.    Past Medical History:  Diagnosis Date  . Actinic keratosis 05/25/2014  . Anxiety   . Atrial fibrillation ([place] 09/21/2010  . Basal cell carcinoma 05/25/2014   Multiple removed by Dr. DDanella Sensingin March 2015: right neck, left neck, scalp   . Bell's palsy 07/18/1979  . Cervicalgia 01/31/2012  . Closed fracture of lumbar vertebra without mention of spinal cord injury 07/17/2005  .  Conjunctiva disorder 12/26/2010  . Coronary atherosclerosis of native coronary artery 07/17/1997  . Cramp of limb 08/16/2009  . Degeneration of lumbar or lumbosacral intervertebral disc 01/31/2012  . Disturbance of skin sensation 08/2009  . Dizziness and giddiness 11/08/2011   vertigo  . External hemorrhoids without mention of complication 6/83/4196  . External hemorrhoids without mention of complication 22/29/7989  . Ganglion of tendon sheath 08/16/2009  . Leg cramp 10/27/2013   Most frequently the right leg.   . Long term (current) use of anticoagulants 09/2010  . Lumbago 07/2009  . Meralgia paresthetica 07/2007  . MI, old   . Myalgia and myositis,  unspecified 01/17/2012  . Osteoporosis   . Other abnormal blood chemistry 04/08/2012  . Other abnormal blood chemistry 2013   hyperglycemia  . Other disorder of muscle, ligament, and fascia 04/08/2012  . Other specified cardiac dysrhythmias(427.89) 06/27/2010  . Pain in joint, ankle and foot 10/27/2013   Bilateral since 1998   . Pain in joint, shoulder region 08/12/2012  . Pain in joint, upper arm 12/26/2010  . Pain in limb 01/11/2011  . Pain in thoracic spine 01/31/2012  . 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 PVC's (premature ventricular contractions)   . Rash and other nonspecific skin eruption 08/02/2011  . Senile osteoporosis 07/17/1993  . Unspecified essential hypertension 07/17/1997  . Varicose veins of lower extremities 08/16/2009  . Varicose veins of lower extremities 08/16/2009    Past Surgical History:  Procedure Laterality Date  . COLONOSCOPY WITH PROPOFOL N/A 10/19/2015   Procedure: COLONOSCOPY WITH PROPOFOL;  Surgeon: Gatha Mayer, MD;  [facility];  Service: Endoscopy;  Laterality: N/A;  . CORONARY ARTERY BYPASS GRAFT  1998   x2; LIMA to LAD; SVG to diagonal off bypass  . HEMORRHOID SURGERY  08/26/2012   Dr. Brantley Stage  . LOOP RECORDER INSERTION N/A 05/13/2018   Procedure: LOOP RECORDER INSERTION;  Surgeon: [name], MD;  Location: [facility];  Service: Cardiovascular;  Laterality: N/A;  . MASS EXCISION Left 11/23/2015   Procedure: LEFT WRIST EXCISION CYST;  Surgeon: Leanora Cover, MD;  Location: [facility];  Service: Orthopedics;  Laterality: Left;  . SKIN BIOPSY  01/29/14   (R) neck; (R) scalp, 2 (L) neck; shave biopsy Superficial basal cell carcinoma Dr. Danella Sensing  . TONSILLECTOMY  1940's    Allergies  Allergen Reactions  . Iodinated Diagnostic Agents Nausea Only and Other (See Comments)    Severe nausea and also passed out  . Bactrim Rash  . Relafen [Nabumetone] Rash    Outpatient Encounter Medications as of 04/02/2019    Medication Sig  . atenolol (TENORMIN) 25 MG tablet TAKE 1 TABLET DAILY  . calcium carbonate (OS-CAL) 600 MG TABS Take 600 mg by mouth 2 (two) times daily with a meal.   . cholecalciferol (VITAMIN D) 1000 UNITS tablet Take 1,000 Units by mouth daily. For Vitamin D supplement.  . digoxin (LANOXIN) 0.125 MG tablet Take 1 tablet (125 mcg total) by mouth every other day.  Marland Kitchen ELIQUIS 2.5 MG TABS tablet TAKE 1 TABLET TWICE A DAY  . hydrochlorothiazide (HYDRODIURIL) 25 MG tablet TAKE 1 TABLET ONCE DAILY  . LORazepam (ATIVAN) 1 MG tablet Take one tablet by mouth at bedtime to decrease anxiety and help with sleep  . [DISCONTINUED] predniSONE (STERAPRED UNI-PAK 21 TAB) 10 MG (21) TBPK tablet Use as directed   No facility-administered encounter medications on file as of 04/02/2019.     Review of  Systems:  Review of Systems  Constitutional: Negative for chills, fever and malaise/fatigue.  HENT: Positive for hearing loss.        Hearing aids work very well  Eyes:       Glasses  Respiratory: Negative for cough and shortness of breath.   Cardiovascular: Positive for leg swelling. Negative for chest pain, palpitations, orthopnea, claudication and PND.       Odd sensation in chest this am that only lasted seconds  Gastrointestinal: Negative for constipation.  Genitourinary: Negative for dysuria.  Musculoskeletal: Positive for joint pain. Negative for back pain and falls.  Skin: Negative for itching and rash.  Neurological: Negative for dizziness and loss of consciousness.  Psychiatric/Behavioral: Negative for depression and memory loss. The patient is not nervous/anxious and does not have insomnia.     Health Maintenance  Topic Date Due  . TETANUS/TDAP  10/06/2017  . INFLUENZA VACCINE  06/07/2019  . DEXA SCAN  Completed  . PNA vac Low Risk Adult  Completed    Physical Exam: Vitals:   04/02/19 1100  BP: 132/80  Pulse: 72  Temp: 98.5 F (36.9 C)  TempSrc: Oral  SpO2: 95%  Weight: 130 lb  (59 kg)  Height: _0  (1.676 m)   Body mass index is 20.98 kg/m. Physical Exam Vitals signs reviewed. Nursing note reviewed: cardiology phone note and last visit reviewed.  Constitutional:      Appearance: Normal appearance.  HENT:     Head: Normocephalic and atraumatic.  Eyes:     Comments: glasses  Cardiovascular:     Rate and Rhythm: Rhythm irregular.  Pulmonary:     Effort: Pulmonary effort is normal.     Breath sounds: Normal breath sounds.  Musculoskeletal: Normal range of motion.     Comments: Very minimal edema distal to medial malleolus  Skin:    General: Skin is warm and dry.     Capillary Refill: Capillary refill takes less than 2 seconds.  Neurological:     General: No focal deficit present.     Mental Status: She is alert and oriented to person, place, and time.  Psychiatric:        Mood and Affect: Mood normal.        Behavior: Behavior normal.        Thought Content: Thought content normal.        Judgment: Judgment normal.     Labs reviewed: Basic Metabolic Panel: Recent Labs    08/16/18 1502 03/25/19 0800  NA 142 141  K 3.7 3.4  CL 95*  --   CO2 30*  --   GLUCOSE 106*  --   BUN 22 26*  CREATININE 1.10* 1.1  CALCIUM 9.7  --    Liver Function Tests: Recent Labs    03/25/19 0800  AST 20  ALT 10  ALKPHOS 68   No results for input(s): LIPASE, AMYLASE in the last 8760 hours. No results for input(s): AMMONIA in the last 8760 hours. CBC: Recent Labs    08/16/18 1502 03/25/19 0800  WBC 8.2 4.6  NEUTROABS 3.7  --   HGB 12.9 13.1  HCT 39.6 39  MCV 89  --   PLT 124* 106*   Lipid Panel: Recent Labs    03/25/19 0800  CHOL 170  HDL 53  LDLCALC 106  TRIG 54   Lab Results  Component Value Date   HGBA1C 6.0 03/25/2019    Procedures since last visit: No results found.  Assessment/Plan 1.  Coronary artery disease involving native coronary artery of native heart without angina pectoris -prior MI; continue healthy diet and walking  program; has implanted loop recorder and no word of any arrhythymia during am seconds long symptoms--if recurs and persists, will need to see cardiology  2. Permanent atrial fibrillation -cont dig for rate control and atenolol  3. Hypercoagulable state due to atrial fibrillation ([place]) -cont eliquis therapy, labs stable  4. Trigger middle finger of right hand --continue pain mgt and f/u with ortho if injection needed  5. Primary osteoarthritis involving multiple joints -cont prn tylenol, topicals  6.  Trigger middle finger left hand -continue pain mgt and f/u with ortho if injection needed  Labs/tests ordered:  _0 @ Next appt:  Visit date not found   Aizlyn Schifano L. Malaia Buchta, D.O. [facility] [address], [place] [address] Cell Phone (Mon-Fri 8am-5pm):  [phone] On Call:  [phone] & follow prompts after 5pm & weekends Office Phone:  [phone] Office Fax:  [phone]

## 2019-04-02 NOTE — Telephone Encounter (Signed)
New Message             Patient is calling in today to talk to a nurse about a strange feeling she felt in her chest while getting up this AM. Pls call to advise.

## 2019-04-08 ENCOUNTER — Ambulatory Visit (INDEPENDENT_AMBULATORY_CARE_PROVIDER_SITE_OTHER): Payer: Medicare Other | Admitting: *Deleted

## 2019-04-08 DIAGNOSIS — R55 Syncope and collapse: Secondary | ICD-10-CM | POA: Diagnosis not present

## 2019-04-09 LAB — CUP PACEART REMOTE DEVICE CHECK
Date Time Interrogation Session: 20200602203914
Implantable Pulse Generator Implant Date: 20190708

## 2019-04-10 ENCOUNTER — Encounter: Payer: Medicare Other | Admitting: Internal Medicine

## 2019-04-16 NOTE — Progress Notes (Signed)
Carelink Summary Report / Loop Recorder 

## 2019-04-28 ENCOUNTER — Other Ambulatory Visit: Payer: Self-pay | Admitting: Internal Medicine

## 2019-04-28 NOTE — Telephone Encounter (Signed)
Per database last filled 11/08/18, original rx written 05/27/18, ok to fill? Pt asking for 90 day supply

## 2019-05-12 ENCOUNTER — Telehealth: Payer: Self-pay | Admitting: *Deleted

## 2019-05-12 ENCOUNTER — Ambulatory Visit (INDEPENDENT_AMBULATORY_CARE_PROVIDER_SITE_OTHER): Payer: Medicare Other | Admitting: *Deleted

## 2019-05-12 DIAGNOSIS — R55 Syncope and collapse: Secondary | ICD-10-CM | POA: Diagnosis not present

## 2019-05-12 LAB — CUP PACEART REMOTE DEVICE CHECK
Date Time Interrogation Session: 20200705221101
Implantable Pulse Generator Implant Date: 20190708

## 2019-05-12 NOTE — Telephone Encounter (Signed)
Per Dr. Lovena Le, pt will need a PPM. Plan to offer virtual visit on Thursday, 05/15/19, to discuss. Will hold a slot next week on Monday or Wednesday for procedure.  Attempted to reach patient. No answer, phone rang continuously. Will try again later.

## 2019-05-12 NOTE — Telephone Encounter (Signed)
Contacted patient regarding [facility] alert received for 8sec duration pause on 05/11/19 at 12:44. Preceded by ~3sec duration pause. Pt was sitting, eating brunch, pt reports she blacked out for a few seconds (5 sec by her estimate). Denies any injury. Pt reports all previous episodes of syncope also occurred while she was sitting eating a meal. Feels well at this time, carried on with normal activity after episode yesterday. Advised I'll call back after discussing with Dr. Lovena Le. Pt verbalizes understanding.

## 2019-05-12 NOTE — Telephone Encounter (Signed)
Spoke with patient. She is aware of recommendations and is agreeable to a virtual visit (via landline phone) on Thursday, 05/15/19 at 10:00am (consent obtained 03/14/19 per notes). Pt will discuss options for implant on 7/13 and 7/15 with her daughter. Initially discussed having labs drawn at [facility] (staff available Tues/Thurs), but pt will also need COVID testing. Per Sonia Baller, RN, will likely need to have COVID test and lab work on same day of procedure. Pt denies additional questions or concerns at this time.

## 2019-05-14 ENCOUNTER — Telehealth: Payer: Self-pay

## 2019-05-14 NOTE — Telephone Encounter (Signed)
Pt is calling back to see if she can have a conference call with Dr. Lovena Le. She needs to know if she will be okay to be in the same room as her daughter because her daughter was around a lot of people. The pt would for someone from [facility] to call her ASAP

## 2019-05-14 NOTE — Telephone Encounter (Signed)
Pt wants a conference call with Dr.Taylorin the am

## 2019-05-14 NOTE — Telephone Encounter (Signed)
Pt states she has a virtual visit with Dr. Lovena Le tomorrow. Her daughter is supposed to be with her to have the virtual visit. Her daughter was in [place] this weekend with her son and was around people. She was wondering should her daughter be in the same room with her since she do not know if her daughter been around anyone with Covid-19. She asked would it be better to do a conference call. I told the pt I will let Dr. Lovena Le nurse know and have her give her a call back. The best number for the pt is [phone]

## 2019-05-14 NOTE — Telephone Encounter (Signed)
Call placed to Pt daughter.  After discussion about procedure daughter states that "yes a conference call would be preferred".  Advised I would forward to [facility] office who will be supporting Dr. Tanna Furry clinic tomorrow 05/15/2019.  Please assist/determine if Dr. Lovena Le can do a conference call with Pt and daughter.  Pt does not have a smart phone.

## 2019-05-14 NOTE — Telephone Encounter (Signed)
Apt scheduled and confirmed with patient per Alphonsus Sias

## 2019-05-15 ENCOUNTER — Telehealth (INDEPENDENT_AMBULATORY_CARE_PROVIDER_SITE_OTHER): Payer: Medicare Other | Admitting: Internal Medicine

## 2019-05-15 ENCOUNTER — Telehealth: Payer: Self-pay

## 2019-05-15 ENCOUNTER — Encounter: Payer: Self-pay | Admitting: Internal Medicine

## 2019-05-15 VITALS — Ht 66.0 in | Wt 130.0 lb

## 2019-05-15 DIAGNOSIS — I4821 Permanent atrial fibrillation: Secondary | ICD-10-CM | POA: Diagnosis not present

## 2019-05-15 DIAGNOSIS — I442 Atrioventricular block, complete: Secondary | ICD-10-CM | POA: Diagnosis not present

## 2019-05-15 DIAGNOSIS — R55 Syncope and collapse: Secondary | ICD-10-CM | POA: Diagnosis not present

## 2019-05-15 NOTE — Telephone Encounter (Signed)
Discussed procedure with Pt/daughter.  Per Pt she would like to schedule for May 26, 2019.  Will get covid and labs on arrival.  Pt lives in retirement community and cannot quarantine.

## 2019-05-15 NOTE — H&P (View-Only) (Signed)
Electrophysiology TeleHealth Note   Due to national recommendations of social distancing due to COVID 19, an audio/video telehealth visit is felt to be most appropriate for this patient at this time.  See MyChart message from today for the patient's consent to telehealth for [facility].   Date:  05/15/2019   ID:  Susan Conway, DOB 1929/11/03, MRN [id number]  Location: patient's home  Provider location: [address] [place]  Evaluation Performed: Follow-up visit  PCP:  [name], DO  Cardiologist:  No primary care provider on file.  Electrophysiologist:  Dr Lovena Le  Chief Complaint:  "I passed out while eating."  History of Present Illness:    Susan Conway is a 85 y.o. female who presents via audio/video conferencing for a telehealth visit today.  She has a long history of atrial fibrillation and unexplained syncope.  The patient has had an implantable loop recorder placed which demonstrated pauses of up to 8 seconds while she was eating.  She has had these episodes in the past but she has not been monitored until now.  She did pass out with this. Since last being seen in our clinic, the patient reports doing very well.  Today, she denies symptoms of palpitations, chest pain, shortness of breath,  lower extremity edema, dizziness, presyncope, or syncope.  The patient is otherwise without complaint today.  The patient denies symptoms of fevers, chills, cough, or new SOB worrisome for COVID 19.  Past Medical History:  Diagnosis Date  . Actinic keratosis 05/25/2014  . Anxiety   . Atrial fibrillation ([place]) 09/21/2010  . Basal cell carcinoma 05/25/2014   Multiple removed by Dr. Danella Sensing in March 2015: right neck, left neck, scalp   . Bell's palsy 07/18/1979  . Cervicalgia 01/31/2012  . Closed fracture of lumbar vertebra without mention of spinal cord injury 07/17/2005  . Conjunctiva disorder 12/26/2010  . Coronary atherosclerosis of native coronary artery  07/17/1997  . Cramp of limb 08/16/2009  . Degeneration of lumbar or lumbosacral intervertebral disc 01/31/2012  . Disturbance of skin sensation 08/2009  . Dizziness and giddiness 11/08/2011   vertigo  . External hemorrhoids without mention of complication 9/56/2130  . External hemorrhoids without mention of complication 86/57/8469  . Ganglion of tendon sheath 08/16/2009  . Leg cramp 10/27/2013   Most frequently the right leg.   . Long term (current) use of anticoagulants 09/2010  . Lumbago 07/2009  . Meralgia paresthetica 07/2007  . MI, old   . Myalgia and myositis, unspecified 01/17/2012  . Osteoporosis   . Other abnormal blood chemistry 04/08/2012  . Other abnormal blood chemistry 2013   hyperglycemia  . Other disorder of muscle, ligament, and fascia 04/08/2012  . Other specified cardiac dysrhythmias(427.89) 06/27/2010  . Pain in joint, ankle and foot 10/27/2013   Bilateral since 1998   . Pain in joint, shoulder region 08/12/2012  . Pain in joint, upper arm 12/26/2010  . Pain in limb 01/11/2011  . Pain in thoracic spine 01/31/2012  . 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 PVC's (premature ventricular contractions)   . Rash and other nonspecific skin eruption 08/02/2011  . Senile osteoporosis 07/17/1993  . Unspecified essential hypertension 07/17/1997  . Varicose veins of lower extremities 08/16/2009  . Varicose veins of lower extremities 08/16/2009    Past Surgical History:  Procedure Laterality Date  . COLONOSCOPY WITH PROPOFOL N/A 10/19/2015   Procedure: COLONOSCOPY WITH PROPOFOL;  Surgeon: Gatha Mayer, MD;  Location: [facility];  Service: Endoscopy;  Laterality: N/A;  . CORONARY ARTERY BYPASS GRAFT  1998   x2; LIMA to LAD; SVG to diagonal off bypass  . HEMORRHOID SURGERY  08/26/2012   Dr. Brantley Stage  . LOOP RECORDER INSERTION N/A 05/13/2018   Procedure: LOOP RECORDER INSERTION;  Surgeon: [name], MD;  Location: [facility];  Service: Cardiovascular;  Laterality:  N/A;  . MASS EXCISION Left 11/23/2015   Procedure: LEFT WRIST EXCISION CYST;  Surgeon: Leanora Cover, MD;  Location: [facility];  Service: Orthopedics;  Laterality: Left;  . SKIN BIOPSY  01/29/14   (R) neck; (R) scalp, 2 (L) neck; shave biopsy Superficial basal cell carcinoma Dr. Danella Sensing  . TONSILLECTOMY  1940's    Current Outpatient Medications  Medication Sig Dispense Refill  . atenolol (TENORMIN) 25 MG tablet TAKE 1 TABLET DAILY 90 tablet 1  . calcium carbonate (OS-CAL) 600 MG TABS Take 600 mg by mouth 2 (two) times daily with a meal.     . cholecalciferol (VITAMIN D) 1000 UNITS tablet Take 1,000 Units by mouth daily. For Vitamin D supplement.    . digoxin (LANOXIN) 0.125 MG tablet Take 1 tablet (125 mcg total) by mouth every other day. 45 tablet 3  . ELIQUIS 2.5 MG TABS tablet TAKE 1 TABLET TWICE A DAY 180 tablet 1  . hydrochlorothiazide (HYDRODIURIL) 25 MG tablet TAKE 1 TABLET ONCE DAILY 90 tablet 1  . LORazepam (ATIVAN) 1 MG tablet TAKE 1 TABLET AT BEDTIME TO DECREASE ANXIETY AND HELP WITH SLEEP (Patient taking differently: 0.5 mg. TAKE 1 TABLET AT BEDTIME TO DECREASE ANXIETY AND HELP WITH SLEEP) 90 tablet 0   No current facility-administered medications for this visit.     Allergies:   Iodinated diagnostic agents, Bactrim, and Relafen [nabumetone]   Social History:  The patient  reports that she has never smoked. She has never used smokeless tobacco. She reports current alcohol use. She reports that she does not use drugs.   Family History:  The patient's  family history includes Heart attack (age of onset: 50) in her father; Heart disease in her brother, father, and sister; Hypertension in her sister.   ROS:  Please see the history of present illness.   All other systems are personally reviewed and negative.    Exam:    Vital Signs:  Ht 5\' 6"  (1.676 m)   Wt 130 lb (59 kg)   BMI 20.98 kg/m   Well appearing, alert and conversant, regular work of breathing,   good skin color Eyes- anicteric, neuro- grossly intact, skin- no apparent rash or lesions or cyanosis, mouth- oral mucosa is pink   Labs/Other Tests and Data Reviewed:    Recent Labs: 03/25/2019: ALT 10; BUN 26; Creatinine 1.1; Hemoglobin 13.1; Platelets 106; Potassium 3.4; Sodium 141   Wt Readings from Last 3 Encounters:  05/15/19 130 lb (59 kg)  04/02/19 130 lb (59 kg)  01/08/19 131 lb (59.4 kg)     Other studies personally reviewed: Additional studies/ records that were reviewed today include: Last device remote is reviewed from [place] PDF dated 7/8 which reveals normal device function, atrial fib with a pause of over 7 seconds   ASSESSMENT & PLAN:    1.  Atrial fib with syncope -the patient is having transient complete heart block and long pauses.  I discussed the situation with both the patient and her daughter.  The risks, goals, benefits, and expectations of permanent pacemaker insertion were  reviewed with both and they wish to proceed.  This will be scheduled in the next several days.  The patient is cautioned not to drive. 2. NSVT - she has had no additional symptoms.  3. HTN - her blood pressure has been mostly controlled.    COVID 19 screen The patient denies symptoms of COVID 19 at this time.  The importance of social distancing was discussed today.  Follow-up:  4 months Next remote: 3 moths  Current medicines are reviewed at length with the patient today.   The patient does not have concerns regarding her medicines.  The following changes were made today:  none  Labs/ tests ordered today include: none No orders of the defined types were placed in this encounter.    Patient Risk:  after full review of this patients clinical status, I feel that they are at moderate risk at this time.  Today, I have spent 25 minutes with the patient with telehealth technology discussing all of the above .    Signed, Cristopher Peru, MD  05/15/2019 9:29 PM     [facility] [address] [place] [address] [phone] (office) [phone] (fax)

## 2019-05-15 NOTE — Progress Notes (Signed)
Electrophysiology TeleHealth Note   Due to national recommendations of social distancing due to COVID 19, an audio/video telehealth visit is felt to be most appropriate for this patient at this time.  See MyChart message from today for the patient's consent to telehealth for [facility].   Date:  05/15/2019   ID:  Susan Conway, DOB October 26, 1929, MRN [id number]  Location: patient's home  Provider location: [address], [place]  Evaluation Performed: Follow-up visit  PCP:  [name], DO  Cardiologist:  No primary care provider on file.  Electrophysiologist:  Dr Lovena Le  Chief Complaint:  "I passed out while eating."  History of Present Illness:    Susan Conway is a 85 y.o. female who presents via audio/video conferencing for a telehealth visit today.  She has a long history of atrial fibrillation and unexplained syncope.  The patient has had an implantable loop recorder placed which demonstrated pauses of up to 8 seconds while she was eating.  She has had these episodes in the past but she has not been monitored until now.  She did pass out with this. Since last being seen in our clinic, the patient reports doing very well.  Today, she denies symptoms of palpitations, chest pain, shortness of breath,  lower extremity edema, dizziness, presyncope, or syncope.  The patient is otherwise without complaint today.  The patient denies symptoms of fevers, chills, cough, or new SOB worrisome for COVID 19.  Past Medical History:  Diagnosis Date  . Actinic keratosis 05/25/2014  . Anxiety   . Atrial fibrillation ([place]) 09/21/2010  . Basal cell carcinoma 05/25/2014   Multiple removed by Dr. Danella Sensing in March 2015: right neck, left neck, scalp   . Bell's palsy 07/18/1979  . Cervicalgia 01/31/2012  . Closed fracture of lumbar vertebra without mention of spinal cord injury 07/17/2005  . Conjunctiva disorder 12/26/2010  . Coronary atherosclerosis of native coronary artery  07/17/1997  . Cramp of limb 08/16/2009  . Degeneration of lumbar or lumbosacral intervertebral disc 01/31/2012  . Disturbance of skin sensation 08/2009  . Dizziness and giddiness 11/08/2011   vertigo  . External hemorrhoids without mention of complication 9/38/1829  . External hemorrhoids without mention of complication 93/71/6967  . Ganglion of tendon sheath 08/16/2009  . Leg cramp 10/27/2013   Most frequently the right leg.   . Long term (current) use of anticoagulants 09/2010  . Lumbago 07/2009  . Meralgia paresthetica 07/2007  . MI, old   . Myalgia and myositis, unspecified 01/17/2012  . Osteoporosis   . Other abnormal blood chemistry 04/08/2012  . Other abnormal blood chemistry 2013   hyperglycemia  . Other disorder of muscle, ligament, and fascia 04/08/2012  . Other specified cardiac dysrhythmias(427.89) 06/27/2010  . Pain in joint, ankle and foot 10/27/2013   Bilateral since 1998   . Pain in joint, shoulder region 08/12/2012  . Pain in joint, upper arm 12/26/2010  . Pain in limb 01/11/2011  . Pain in thoracic spine 01/31/2012  . 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 PVC's (premature ventricular contractions)   . Rash and other nonspecific skin eruption 08/02/2011  . Senile osteoporosis 07/17/1993  . Unspecified essential hypertension 07/17/1997  . Varicose veins of lower extremities 08/16/2009  . Varicose veins of lower extremities 08/16/2009    Past Surgical History:  Procedure Laterality Date  . COLONOSCOPY WITH PROPOFOL N/A 10/19/2015   Procedure: COLONOSCOPY WITH PROPOFOL;  Surgeon: Gatha Mayer, MD;  [facility];  Service: Endoscopy;  Laterality: N/A;  . CORONARY ARTERY BYPASS GRAFT  1998   x2; LIMA to LAD; SVG to diagonal off bypass  . HEMORRHOID SURGERY  08/26/2012   Dr. Brantley Stage  . LOOP RECORDER INSERTION N/A 05/13/2018   Procedure: LOOP RECORDER INSERTION;  Surgeon: [name], MD;  Location: [facility];  Service: Cardiovascular;  Laterality:  N/A;  . MASS EXCISION Left 11/23/2015   Procedure: LEFT WRIST EXCISION CYST;  Surgeon: Leanora Cover, MD;  Location: [facility];  Service: Orthopedics;  Laterality: Left;  . SKIN BIOPSY  01/29/14   (R) neck; (R) scalp, 2 (L) neck; shave biopsy Superficial basal cell carcinoma Dr. Danella Sensing  . TONSILLECTOMY  1940's    Current Outpatient Medications  Medication Sig Dispense Refill  . atenolol (TENORMIN) 25 MG tablet TAKE 1 TABLET DAILY 90 tablet 1  . calcium carbonate (OS-CAL) 600 MG TABS Take 600 mg by mouth 2 (two) times daily with a meal.     . cholecalciferol (VITAMIN D) 1000 UNITS tablet Take 1,000 Units by mouth daily. For Vitamin D supplement.    . digoxin (LANOXIN) 0.125 MG tablet Take 1 tablet (125 mcg total) by mouth every other day. 45 tablet 3  . ELIQUIS 2.5 MG TABS tablet TAKE 1 TABLET TWICE A DAY 180 tablet 1  . hydrochlorothiazide (HYDRODIURIL) 25 MG tablet TAKE 1 TABLET ONCE DAILY 90 tablet 1  . LORazepam (ATIVAN) 1 MG tablet TAKE 1 TABLET AT BEDTIME TO DECREASE ANXIETY AND HELP WITH SLEEP (Patient taking differently: 0.5 mg. TAKE 1 TABLET AT BEDTIME TO DECREASE ANXIETY AND HELP WITH SLEEP) 90 tablet 0   No current facility-administered medications for this visit.     Allergies:   Iodinated diagnostic agents, Bactrim, and Relafen [nabumetone]   Social History:  The patient  reports that she has never smoked. She has never used smokeless tobacco. She reports current alcohol use. She reports that she does not use drugs.   Family History:  The patient's  family history includes Heart attack (age of onset: 50) in her father; Heart disease in her brother, father, and sister; Hypertension in her sister.   ROS:  Please see the history of present illness.   All other systems are personally reviewed and negative.    Exam:    Vital Signs:  Ht 5\' 6"  (1.676 m)   Wt 130 lb (59 kg)   BMI 20.98 kg/m   Well appearing, alert and conversant, regular work of breathing,   good skin color Eyes- anicteric, neuro- grossly intact, skin- no apparent rash or lesions or cyanosis, mouth- oral mucosa is pink   Labs/Other Tests and Data Reviewed:    Recent Labs: 03/25/2019: ALT 10; BUN 26; Creatinine 1.1; Hemoglobin 13.1; Platelets 106; Potassium 3.4; Sodium 141   Wt Readings from Last 3 Encounters:  05/15/19 130 lb (59 kg)  04/02/19 130 lb (59 kg)  01/08/19 131 lb (59.4 kg)     Other studies personally reviewed: Additional studies/ records that were reviewed today include: Last device remote is reviewed from [facility] PDF dated 7/8 which reveals normal device function, atrial fib with a pause of over 7 seconds   ASSESSMENT & PLAN:    1.  Atrial fib with syncope -the patient is having transient complete heart block and long pauses.  I discussed the situation with both the patient and her daughter.  The risks, goals, benefits, and expectations of permanent pacemaker insertion were  reviewed with both and they wish to proceed.  This will be scheduled in the next several days.  The patient is cautioned not to drive. 2. NSVT - she has had no additional symptoms.  3. HTN - her blood pressure has been mostly controlled.    COVID 19 screen The patient denies symptoms of COVID 19 at this time.  The importance of social distancing was discussed today.  Follow-up:  4 months Next remote: 3 moths  Current medicines are reviewed at length with the patient today.   The patient does not have concerns regarding her medicines.  The following changes were made today:  none  Labs/ tests ordered today include: none No orders of the defined types were placed in this encounter.    Patient Risk:  after full review of this patients clinical status, I feel that they are at moderate risk at this time.  Today, I have spent 25 minutes with the patient with telehealth technology discussing all of the above .    Signed, Cristopher Peru, MD  05/15/2019 9:29 PM     [facility] [address] [place] [address] [phone] (office) [phone] (fax)

## 2019-05-20 ENCOUNTER — Ambulatory Visit
Admission: RE | Admit: 2019-05-20 | Discharge: 2019-05-20 | Disposition: A | Discharge status: Home / Self Care | Payer: Medicare Other | Source: Ambulatory Visit | Attending: Internal Medicine | Admitting: Internal Medicine

## 2019-05-20 ENCOUNTER — Other Ambulatory Visit: Payer: Self-pay | Admitting: Internal Medicine

## 2019-05-20 ENCOUNTER — Other Ambulatory Visit: Payer: Self-pay

## 2019-05-20 DIAGNOSIS — M25531 Pain in right wrist: Secondary | ICD-10-CM

## 2019-05-20 DIAGNOSIS — M25431 Effusion, right wrist: Secondary | ICD-10-CM

## 2019-05-20 DIAGNOSIS — M7989 Other specified soft tissue disorders: Secondary | ICD-10-CM | POA: Diagnosis not present

## 2019-05-20 IMAGING — CR RIGHT WRIST - COMPLETE 3+ VIEW
4 series · 4 of 4 positions shown · non-contrast
Comparison: None.

CLINICAL DATA: Injury a few days ago with pain and swelling,
initial encounter

EXAM:
RIGHT WRIST - COMPLETE 3+ VIEW

[x wrist pa right]
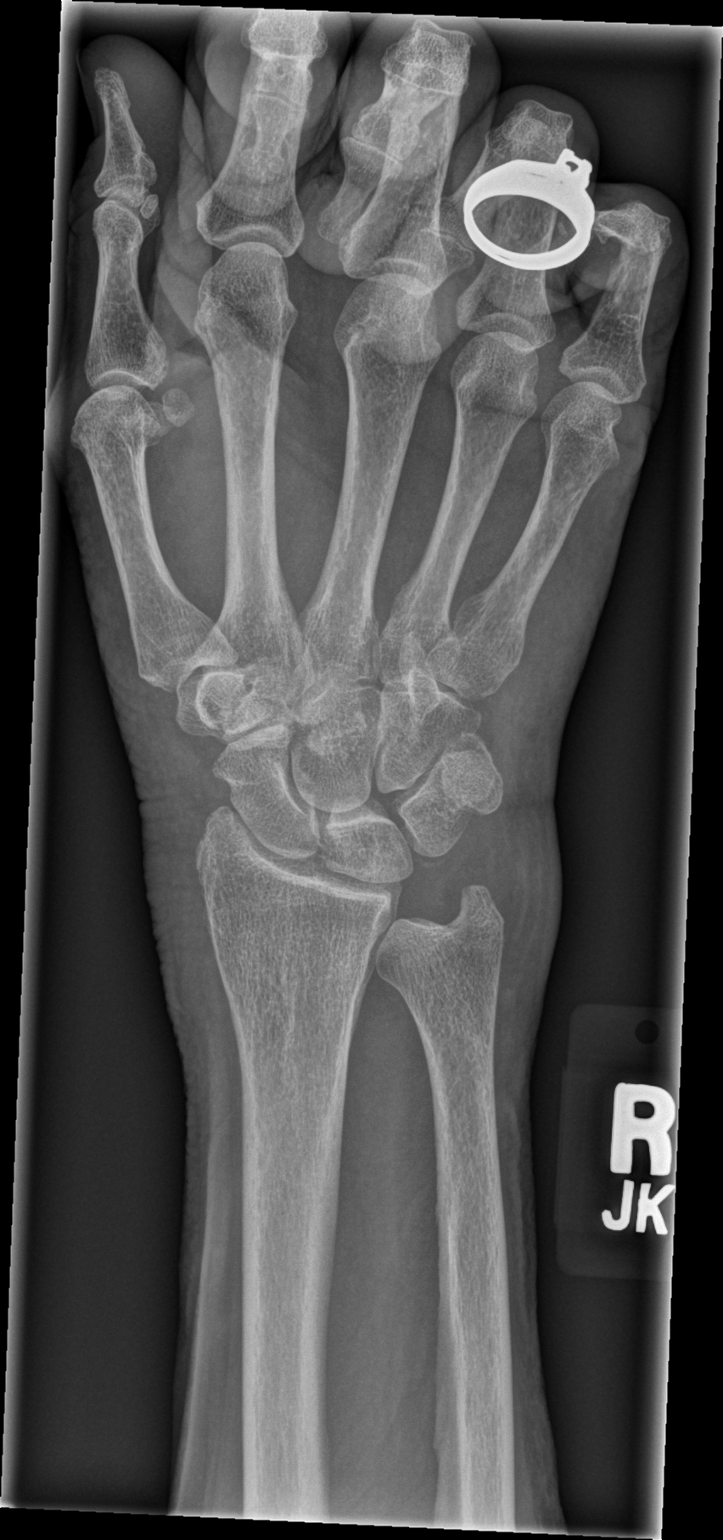

[x wrist obl right]
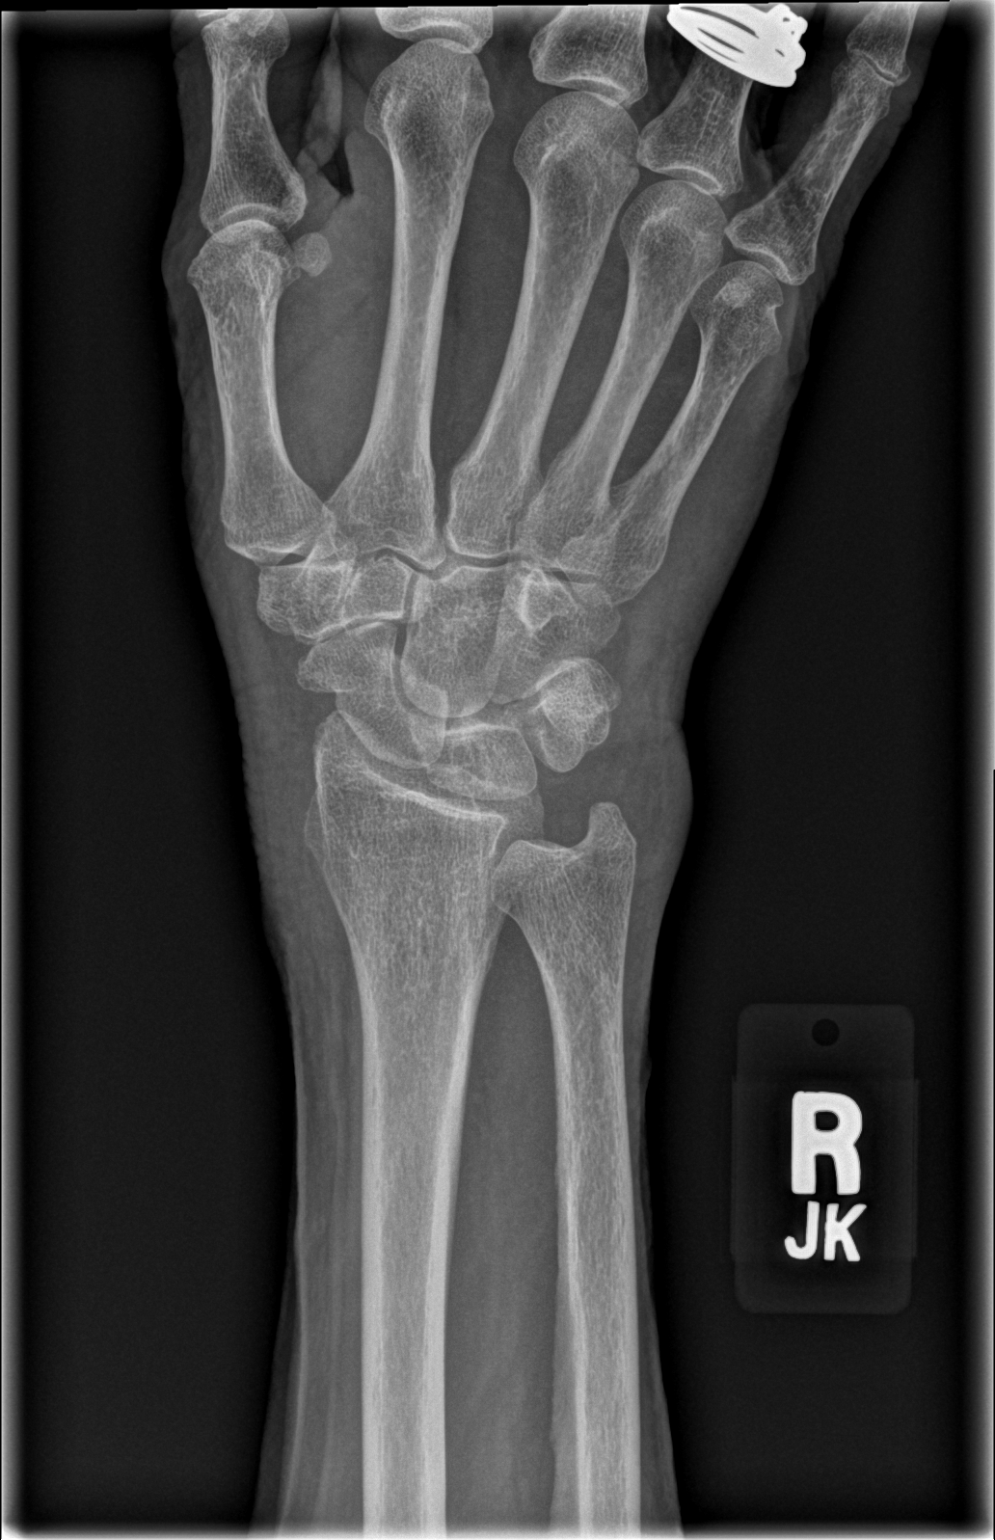

[x wrist navicular view right]
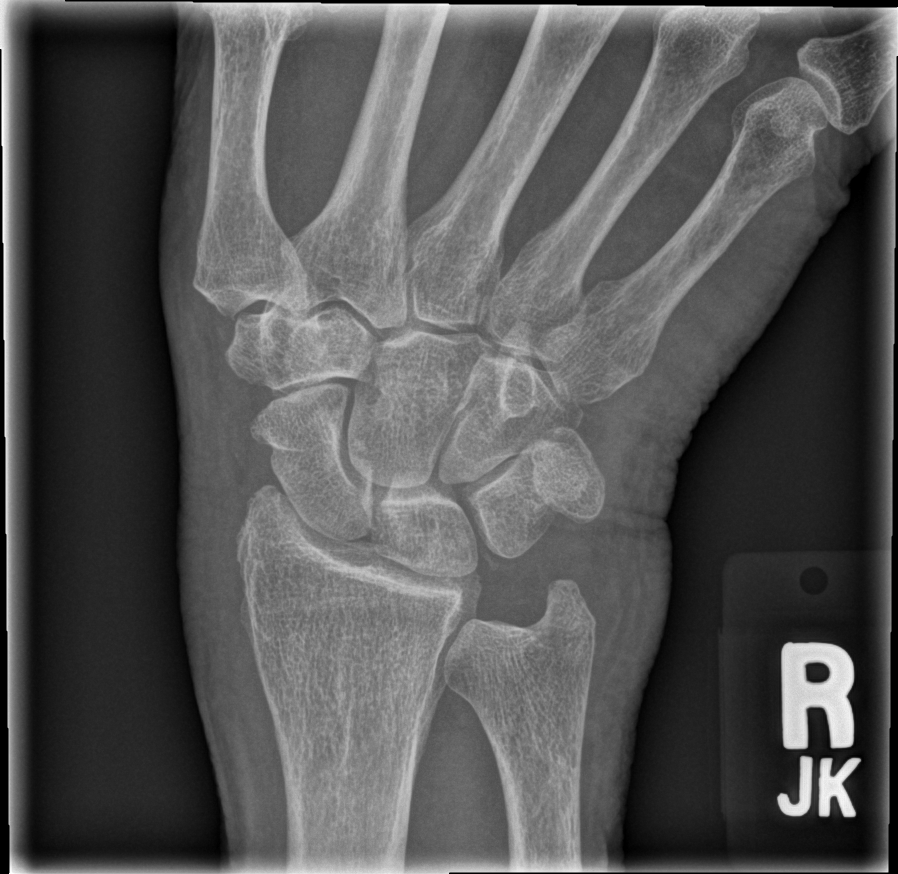

[x wrist lat right]
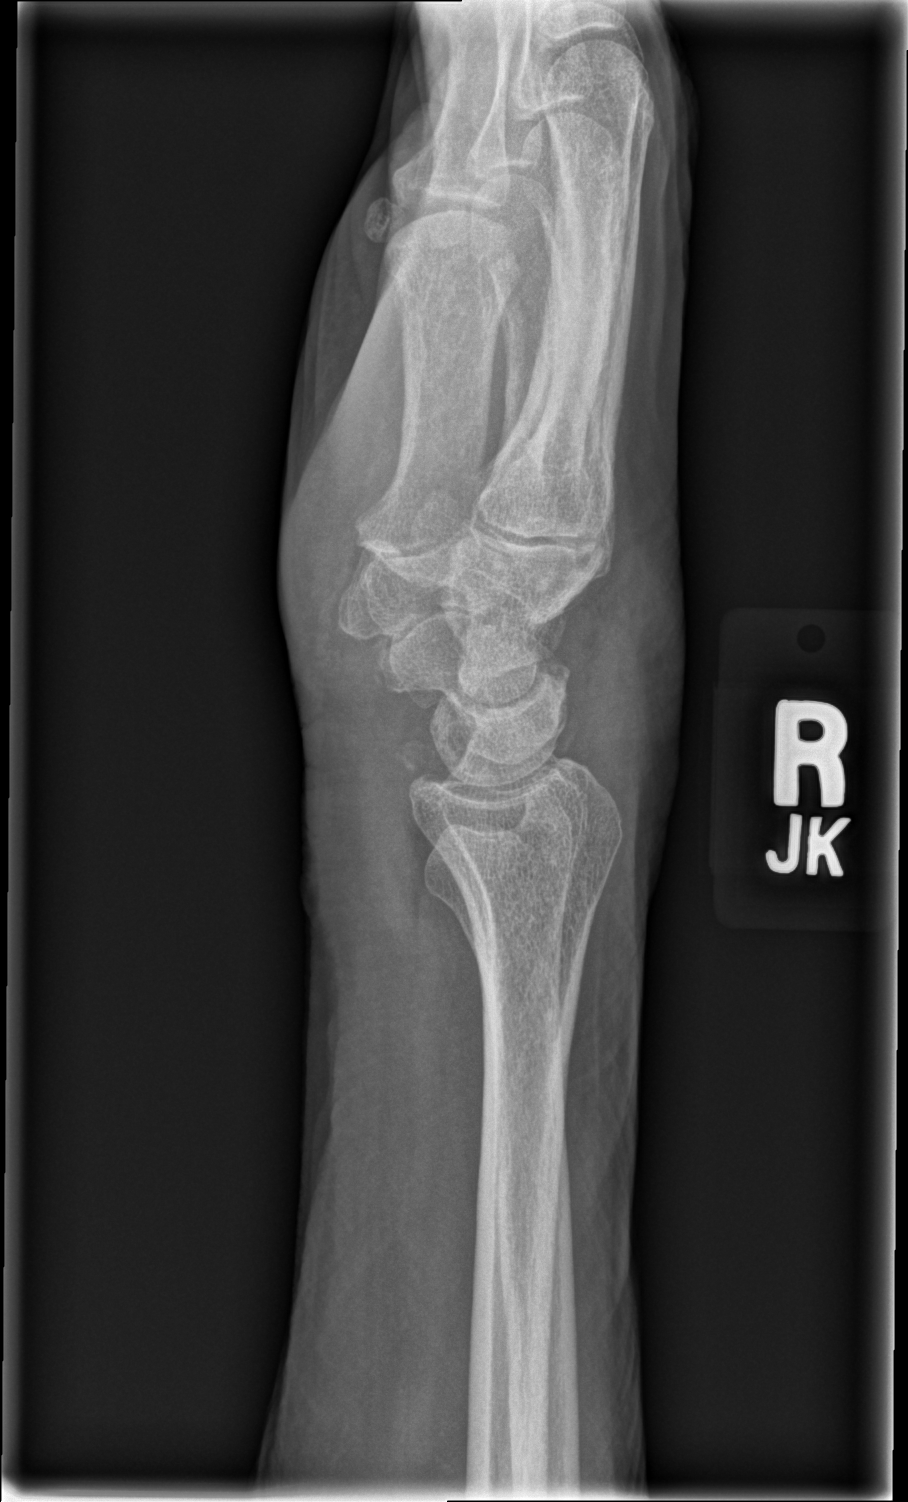

[4 of 4 positions shown; findings below may reference images not displayed]

FINDINGS: No acute fracture or dislocation is noted. Mild ulnar minus variant
is seen. Mild soft tissue swelling is seen.
IMPRESSION: Soft tissue swelling without acute abnormality.

## 2019-05-20 NOTE — Telephone Encounter (Signed)
Left message.  Advised this nurse had contacted [facility] as directed to set up after care after her PPM 05/26/2019.  Will attempt to contact tomorrow.

## 2019-05-21 NOTE — Telephone Encounter (Signed)
Per Pt [facility] contacted her and stated they would get her set up for care post pacemaker on May 26, 2019.  Gave Pt following instruction:  Please arrive at the [facility] main entrance of [facility] hospital at:  6:30 am on May 26, 2019 Do not eat or drink after midnight prior to procedure Do not take Eliquis for 2 days prior to procedure.Will need someone to drive  Wash with antibacterial soap night before and morning of procedure.  Pt has some right hand swelling that nurse at her community thinks is gout.  Advised pt ok for them to treat/steroids will not cause delay in procedure.  Pt to call if any further needs.

## 2019-05-21 NOTE — Progress Notes (Signed)
Carelink Summary Report / Loop Recorder 

## 2019-05-22 ENCOUNTER — Telehealth: Payer: Self-pay

## 2019-05-22 NOTE — Telephone Encounter (Signed)
Pt states she has some questions about her procedure on Monday and would like for Sonia Baller to give her a call today.

## 2019-05-22 NOTE — Telephone Encounter (Signed)
Returned call to Pt. All questions answered.

## 2019-05-23 ENCOUNTER — Other Ambulatory Visit (HOSPITAL_COMMUNITY)
Admission: RE | Admit: 2019-05-23 | Discharge: 2019-05-23 | Disposition: A | Discharge status: Home / Self Care | Payer: Medicare Other | Source: Ambulatory Visit | Attending: Internal Medicine | Admitting: Internal Medicine

## 2019-05-23 DIAGNOSIS — Z1159 Encounter for screening for other viral diseases: Secondary | ICD-10-CM | POA: Insufficient documentation

## 2019-05-23 LAB — SARS CORONAVIRUS 2 (TAT 6-24 HRS): SARS Coronavirus 2: NEGATIVE

## 2019-05-26 ENCOUNTER — Ambulatory Visit (HOSPITAL_COMMUNITY)
Admission: RE | Disposition: A | Discharge status: Home / Self Care | Payer: Medicare Other | Source: Home / Self Care | Attending: Internal Medicine

## 2019-05-26 ENCOUNTER — Ambulatory Visit (HOSPITAL_COMMUNITY): Payer: Medicare Other

## 2019-05-26 ENCOUNTER — Ambulatory Visit (HOSPITAL_COMMUNITY)
Admission: RE | Admit: 2019-05-26 | Discharge: 2019-05-26 | Disposition: A | Discharge status: Home / Self Care | Payer: Medicare Other | Attending: Internal Medicine | Admitting: Internal Medicine

## 2019-05-26 ENCOUNTER — Other Ambulatory Visit: Payer: Self-pay

## 2019-05-26 DIAGNOSIS — I10 Essential (primary) hypertension: Secondary | ICD-10-CM | POA: Diagnosis not present

## 2019-05-26 DIAGNOSIS — Z881 Allergy status to other antibiotic agents status: Secondary | ICD-10-CM | POA: Diagnosis not present

## 2019-05-26 DIAGNOSIS — I252 Old myocardial infarction: Secondary | ICD-10-CM | POA: Diagnosis not present

## 2019-05-26 DIAGNOSIS — Z91041 Radiographic dye allergy status: Secondary | ICD-10-CM | POA: Diagnosis not present

## 2019-05-26 DIAGNOSIS — I2510 Atherosclerotic heart disease of native coronary artery without angina pectoris: Secondary | ICD-10-CM | POA: Diagnosis not present

## 2019-05-26 DIAGNOSIS — I472 Ventricular tachycardia: Secondary | ICD-10-CM | POA: Diagnosis not present

## 2019-05-26 DIAGNOSIS — I442 Atrioventricular block, complete: Secondary | ICD-10-CM | POA: Diagnosis not present

## 2019-05-26 DIAGNOSIS — Z7901 Long term (current) use of anticoagulants: Secondary | ICD-10-CM | POA: Diagnosis not present

## 2019-05-26 DIAGNOSIS — Z79899 Other long term (current) drug therapy: Secondary | ICD-10-CM | POA: Diagnosis not present

## 2019-05-26 DIAGNOSIS — M810 Age-related osteoporosis without current pathological fracture: Secondary | ICD-10-CM | POA: Diagnosis not present

## 2019-05-26 DIAGNOSIS — Z8249 Family history of ischemic heart disease and other diseases of the circulatory system: Secondary | ICD-10-CM | POA: Insufficient documentation

## 2019-05-26 DIAGNOSIS — I8393 Asymptomatic varicose veins of bilateral lower extremities: Secondary | ICD-10-CM | POA: Diagnosis not present

## 2019-05-26 DIAGNOSIS — Z951 Presence of aortocoronary bypass graft: Secondary | ICD-10-CM | POA: Insufficient documentation

## 2019-05-26 DIAGNOSIS — I482 Chronic atrial fibrillation, unspecified: Secondary | ICD-10-CM | POA: Diagnosis not present

## 2019-05-26 DIAGNOSIS — Z950 Presence of cardiac pacemaker: Secondary | ICD-10-CM

## 2019-05-26 DIAGNOSIS — I4891 Unspecified atrial fibrillation: Secondary | ICD-10-CM | POA: Diagnosis not present

## 2019-05-26 HISTORY — PX: PACEMAKER IMPLANT: EP1218

## 2019-05-26 HISTORY — PX: LOOP RECORDER REMOVAL: EP1215

## 2019-05-26 LAB — BASIC METABOLIC PANEL
Anion gap: 10 (ref 5–15)
BUN: 24 mg/dL — ABNORMAL HIGH (ref 8–23)
CO2: 26 mmol/L (ref 22–32)
Calcium: 9.4 mg/dL (ref 8.9–10.3)
Chloride: 99 mmol/L (ref 98–111)
Creatinine, Ser: 0.97 mg/dL (ref 0.44–1.00)
GFR calc Af Amer: >60 mL/min (ref >60)
GFR calc non Af Amer: 53 mL/min — ABNORMAL LOW (ref >60)
Glucose, Bld: 103 mg/dL — ABNORMAL HIGH (ref 70–99)
Potassium: 3.2 mmol/L — ABNORMAL LOW (ref 3.5–5.1)
Sodium: 135 mmol/L (ref 135–145)

## 2019-05-26 LAB — CBC
HCT: 36.7 % (ref 36.0–46.0)
Hemoglobin: 11.9 g/dL — ABNORMAL LOW (ref 12.0–15.0)
MCH: 29 pg (ref 26.0–34.0)
MCHC: 32.4 g/dL (ref 30.0–36.0)
MCV: 89.3 fL (ref 80.0–100.0)
Platelets: 108 10*3/uL — ABNORMAL LOW (ref 150–400)
RBC: 4.11 MIL/uL (ref 3.87–5.11)
RDW: 12.9 % (ref 11.5–15.5)
WBC: 5.1 10*3/uL (ref 4.0–10.5)
nRBC: 0 % (ref 0.0–0.2)

## 2019-05-26 LAB — SURGICAL PCR SCREEN
MRSA, PCR: NEGATIVE
Staphylococcus aureus: POSITIVE — AB

## 2019-05-26 IMAGING — CR CHEST - 2 VIEW
2 series · 2 of 2 positions shown · non-contrast
Comparison: Chest x-ray dated [DATE].

CLINICAL DATA: Pacemaker placement.

EXAM:
CHEST - 2 VIEW

[w chest pa]
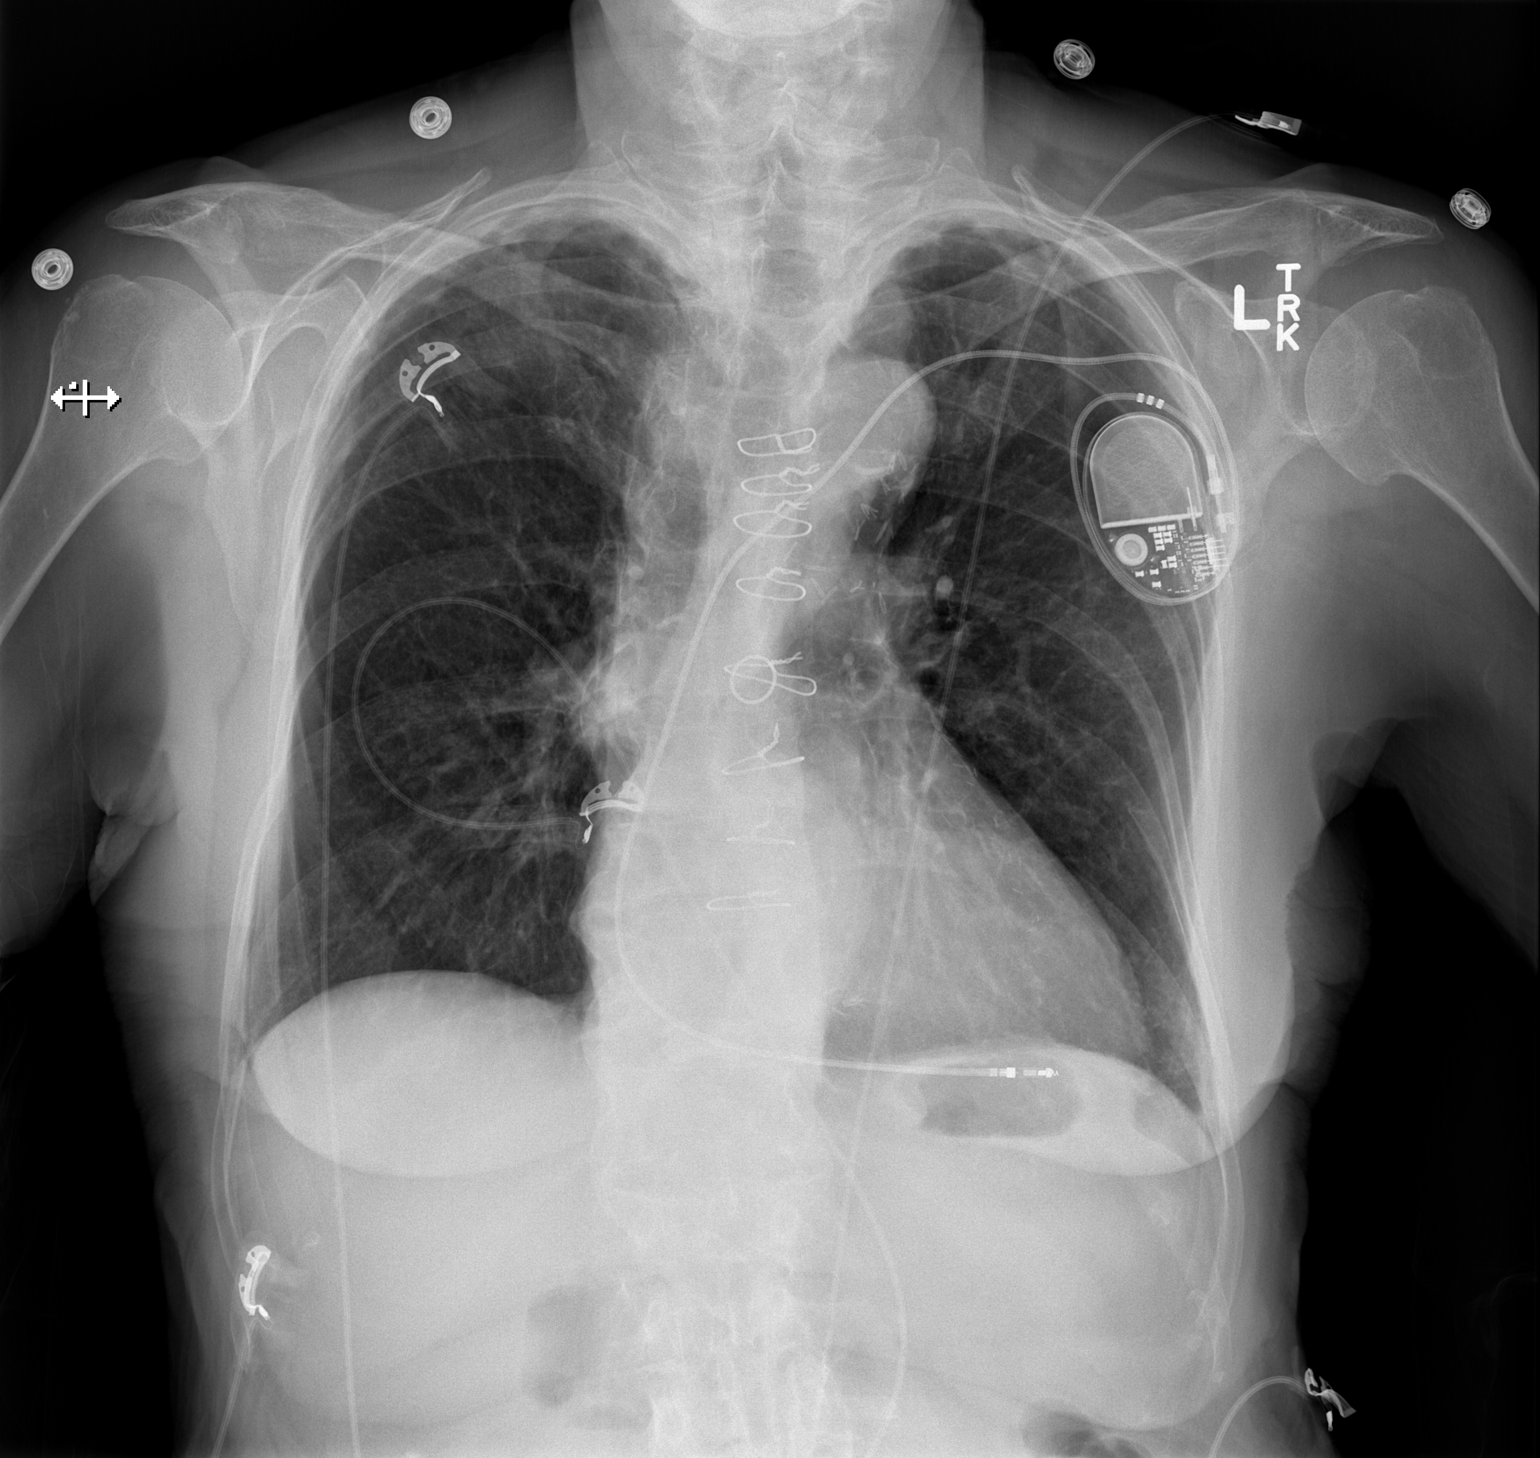

[w chest lat]
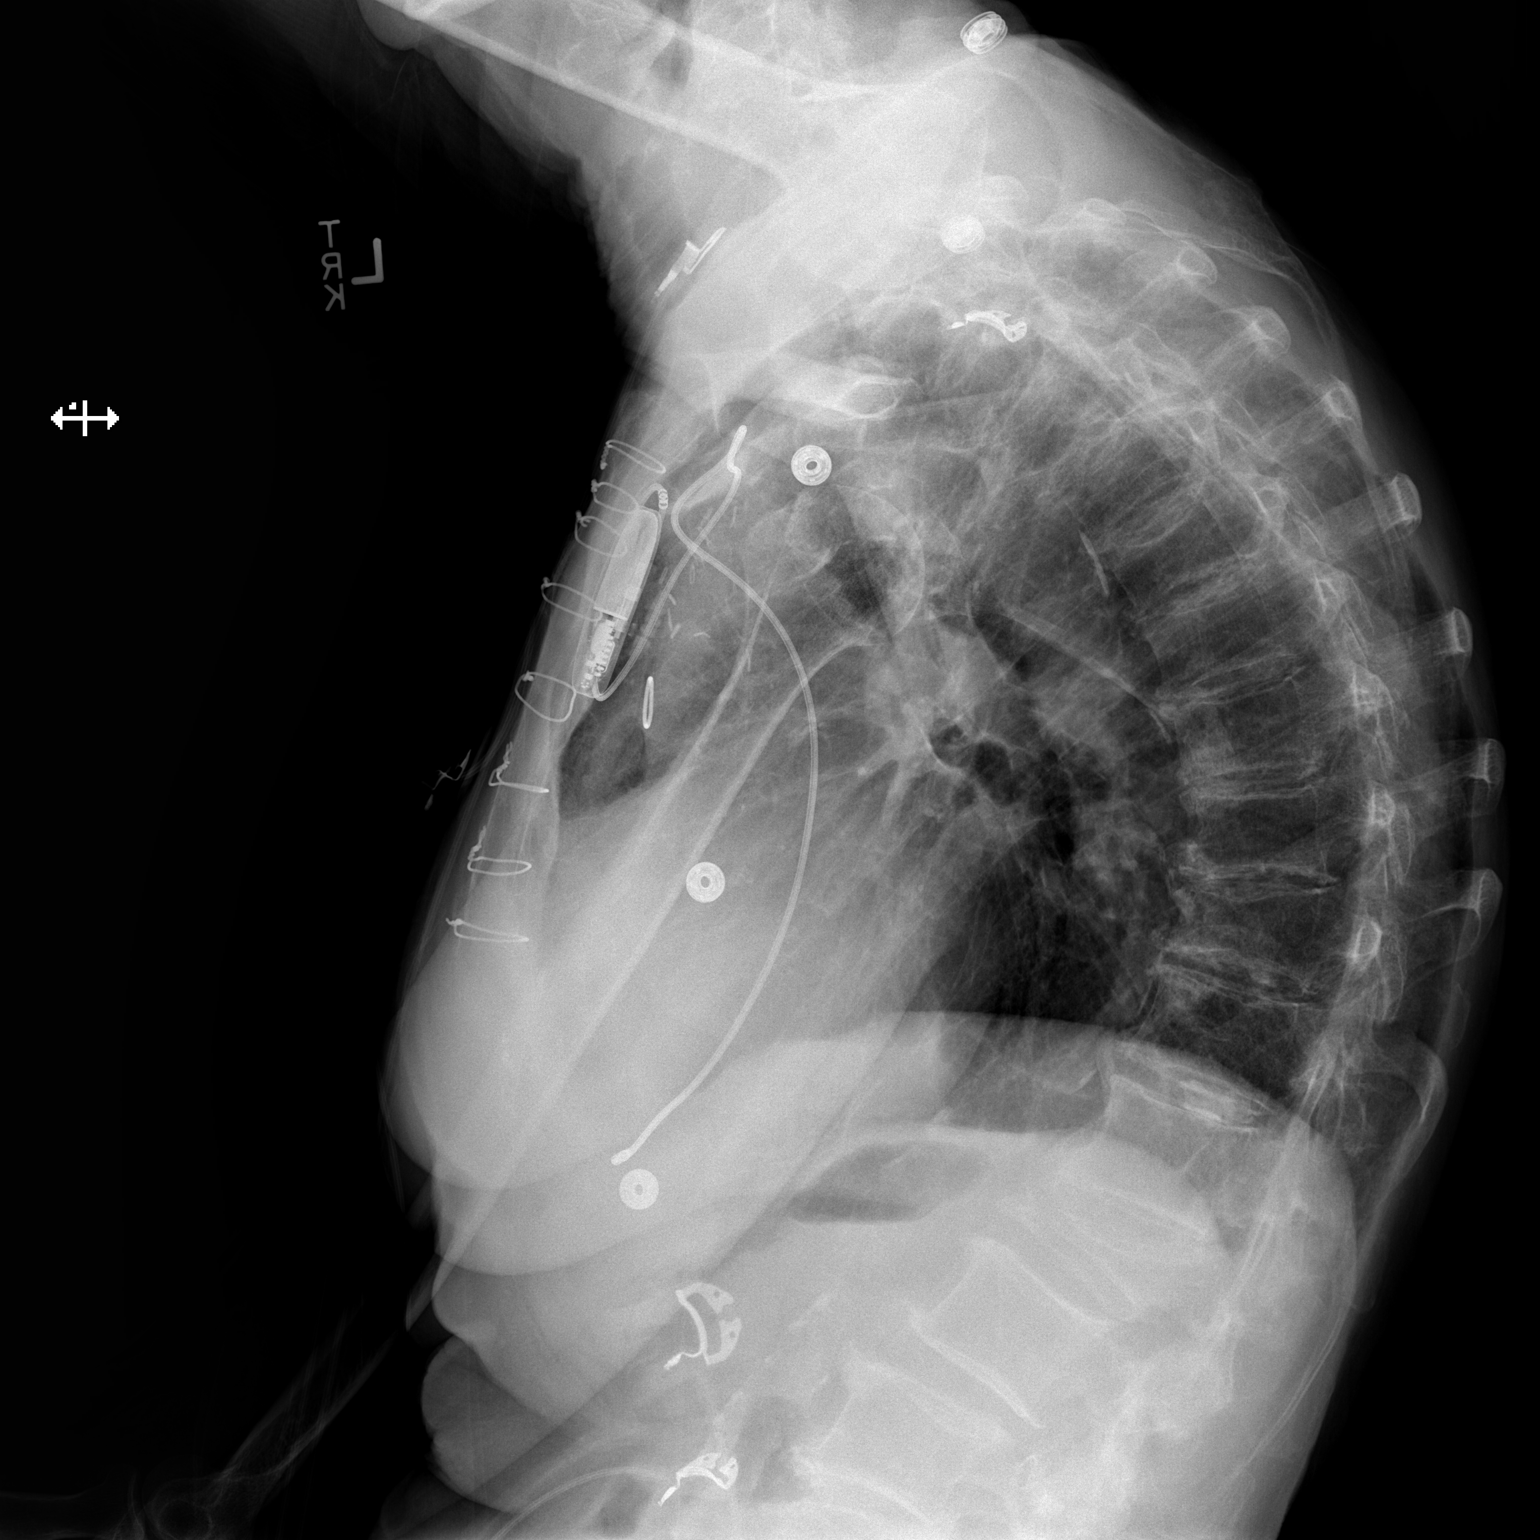

[2 of 2 positions shown; findings below may reference images not displayed]

FINDINGS: New left chest wall pacemaker with single lead terminating in the
right ventricle. Unchanged borderline cardiomegaly status post CABG.
Normal mediastinal contours. Atherosclerotic calcification of the
aortic arch. Normal pulmonary vascularity. No focal consolidation,
pleural effusion, or pneumothorax. No acute osseous abnormality.
Chronic L1 compression deformity.
IMPRESSION: 1. New left chest wall pacemaker without complicating feature.
2. No active cardiopulmonary disease.

## 2019-05-26 SURGERY — PACEMAKER IMPLANT

## 2019-05-26 MED ORDER — FENTANYL CITRATE (PF) 100 MCG/2ML IJ SOLN
INTRAMUSCULAR | Status: DC | PRN
  Administered 2019-05-26: 25 ug via INTRAVENOUS

## 2019-05-26 MED ORDER — LIDOCAINE HCL 1 % IJ SOLN
INTRAMUSCULAR | Status: AC
  Filled 2019-05-26: qty 60

## 2019-05-26 MED ORDER — SODIUM CHLORIDE 0.9 % IV SOLN
INTRAVENOUS | Status: DC
  Administered 2019-05-26: 08:00:00 via INTRAVENOUS

## 2019-05-26 MED ORDER — FENTANYL CITRATE (PF) 100 MCG/2ML IJ SOLN
INTRAMUSCULAR | Status: AC
  Filled 2019-05-26: qty 2

## 2019-05-26 MED ORDER — POTASSIUM CHLORIDE CRYS ER 20 MEQ PO TBCR
40.0000 meq | EXTENDED_RELEASE_TABLET | Freq: Once | ORAL | Status: AC
  Administered 2019-05-26: 40 meq via ORAL
  Filled 2019-05-26 (×2): qty 2

## 2019-05-26 MED ORDER — HEPARIN (PORCINE) IN NACL 1000-0.9 UT/500ML-% IV SOLN
INTRAVENOUS | Status: DC | PRN
  Administered 2019-05-26: 500 mL

## 2019-05-26 MED ORDER — LIDOCAINE HCL (PF) 1 % IJ SOLN
INTRAMUSCULAR | Status: DC | PRN
  Administered 2019-05-26: 11:00:00 60 mL

## 2019-05-26 MED ORDER — MIDAZOLAM HCL 5 MG/5ML IJ SOLN
INTRAMUSCULAR | Status: DC | PRN
  Administered 2019-05-26 (×2): 1 mg via INTRAVENOUS

## 2019-05-26 MED ORDER — HEPARIN (PORCINE) IN NACL 1000-0.9 UT/500ML-% IV SOLN
INTRAVENOUS | Status: AC
  Filled 2019-05-26: qty 500

## 2019-05-26 MED ORDER — MIDAZOLAM HCL 5 MG/5ML IJ SOLN
INTRAMUSCULAR | Status: AC
  Filled 2019-05-26: qty 5

## 2019-05-26 MED ORDER — ACETAMINOPHEN 325 MG PO TABS: 325.0000 mg | ORAL_TABLET | ORAL | Status: DC | PRN

## 2019-05-26 MED ORDER — SODIUM CHLORIDE 0.9 % IV SOLN
80.0000 mg | INTRAVENOUS | Status: AC
  Administered 2019-05-26: 80 mg
  Filled 2019-05-26: qty 2

## 2019-05-26 MED ORDER — CEFAZOLIN SODIUM-DEXTROSE 2-4 GM/100ML-% IV SOLN
INTRAVENOUS | Status: AC
  Filled 2019-05-26: qty 100

## 2019-05-26 MED ORDER — CEFAZOLIN SODIUM-DEXTROSE 2-4 GM/100ML-% IV SOLN
2.0000 g | INTRAVENOUS | Status: AC
  Administered 2019-05-26: 2 g via INTRAVENOUS
  Filled 2019-05-26: qty 100

## 2019-05-26 MED ORDER — SODIUM CHLORIDE 0.9 % IV SOLN
INTRAVENOUS | Status: AC
  Filled 2019-05-26: qty 2

## 2019-05-26 MED ORDER — ONDANSETRON HCL 4 MG/2ML IJ SOLN: 4.0000 mg | Freq: Four times a day (QID) | INTRAMUSCULAR | Status: DC | PRN

## 2019-05-26 MED ORDER — MUPIROCIN 2 % EX OINT
TOPICAL_OINTMENT | CUTANEOUS | Status: AC
  Administered 2019-05-26: 1
  Filled 2019-05-26: qty 22

## 2019-05-26 MED ORDER — CHLORHEXIDINE GLUCONATE 4 % EX LIQD
60.0000 mL | Freq: Once | CUTANEOUS | Status: DC
  Filled 2019-05-26: qty 60

## 2019-05-26 MED ORDER — CEFAZOLIN SODIUM-DEXTROSE 1-4 GM/50ML-% IV SOLN: 1.0000 g | Freq: Once | INTRAVENOUS | Status: DC

## 2019-05-26 SURGICAL SUPPLY — 8 items
CABLE SURGICAL S-101-97-12 (CABLE) ×3 IMPLANT
GUIDEWIRE ANGLED .035X150CM (WIRE) ×2 IMPLANT
LEAD TENDRIL MRI 58CM LPA1200M (Lead) ×2 IMPLANT
PACEMAKER ASSURITY SR-SF (Pacemaker) ×2 IMPLANT
PAD PRO RADIOLUCENT 2001M-C (PAD) ×3 IMPLANT
SHEATH CLASSIC 8F (SHEATH) ×2 IMPLANT
TRAY PACEMAKER INSERTION (PACKS) ×3 IMPLANT
WIRE HI TORQ VERSACORE-J 145CM (WIRE) ×2 IMPLANT

## 2019-05-26 NOTE — Interval H&P Note (Signed)
History and Physical Interval Note:  05/26/2019 10:08 AM  Susan Conway  has presented today for surgery, with the diagnosis of Bradicardia.  The various methods of treatment have been discussed with the patient and family. After consideration of risks, benefits and other options for treatment, the patient has consented to  Procedure(s): PACEMAKER IMPLANT (N/A) as a surgical intervention.  The patient's history has been reviewed, patient examined, no change in status, stable for surgery.  I have reviewed the patient's chart and labs.  Questions were answered to the patient's satisfaction.     Cristopher Peru

## 2019-05-26 NOTE — Discharge Instructions (Signed)
05/27/2019, PLEASE SEND A REMOTE DEVICE TRANSMISSION        Supplemental Discharge Instructions for  Pacemaker/Defibrillator Patients  Activity No heavy lifting or vigorous activity with your left/right arm for 6 to 8 weeks.  Do not raise your left/right arm above your head for one week.  Gradually raise your affected arm as drawn below.              05/30/2019                 05/31/2019               06/01/2019                06/02/2019 __  NO DRIVING for  1 week   ; you may begin driving on   5/83/0940  .  WOUND CARE - Keep the wound area clean and dry.  Do not get this area wet for one week. No showers for one week; you may shower on 06/02/2019  . - Remove arm sling 05/27/2019 - Remove outer plastic bandage 05/27/2019, leave paper tapes (steristrips) underneath in place, do not remove these - The tape/steri-strips on your wound will fall off; do not pull them off.  No bandage is needed on the site.  DO  NOT apply any creams, oils, or ointments to the wound area. - If you notice any drainage or discharge from the wound, any swelling or bruising at the site, or you develop a fever > 101? F after you are discharged home, call the office at once.  Special Instructions - You are still able to use cellular telephones; use the ear opposite the side where you have your pacemaker/defibrillator.  Avoid carrying your cellular phone near your device. - When traveling through airports, show security personnel your identification card to avoid being screened in the metal detectors.  Ask the security personnel to use the hand wand. - Avoid arc welding equipment, MRI testing (magnetic resonance imaging), TENS units (transcutaneous nerve stimulators).  Call the office for questions about other devices. - Avoid electrical appliances that are in poor condition or are not properly grounded. - Microwave ovens are safe to be near or to operate.

## 2019-05-27 ENCOUNTER — Telehealth: Payer: Self-pay

## 2019-05-27 ENCOUNTER — Encounter (HOSPITAL_COMMUNITY): Payer: Self-pay | Admitting: Internal Medicine

## 2019-05-27 MED FILL — Lidocaine HCl Local Inj 1%: INTRAMUSCULAR | Qty: 60 | Status: AC

## 2019-05-27 NOTE — Telephone Encounter (Signed)
Pt states she would like for me to call her tomorrow to do the transmission. She have to get someone to move some furnisher to get her monitor set up. I agreed to call the pt @2pm  tomorrow.

## 2019-05-28 ENCOUNTER — Telehealth: Payer: Self-pay

## 2019-05-28 NOTE — Telephone Encounter (Signed)
Pt states she is having some swelling and redness where her LINQ was removed 05/26/19, she is unable to send a picture. Pt denies fever/chills and states there is no drainage coming from the site. Instructed pt to call DC back if the site develops drainage or increases in swelling or redness.

## 2019-05-28 NOTE — Telephone Encounter (Signed)
-----   Message from [place], [place] sent at 05/26/2019  3:40 PM EDT ----- Same day discharge from [facility] implant.  Please follow up with pt, remote transmission tomorrow  Thanks renee

## 2019-05-28 NOTE — Telephone Encounter (Signed)
Pt states she is having some swelling on her wound site where the loop recorder was. She would like for the device nurse to give her a call back. I explained to her the device nurse will not be in the office until this afternoon and will give her a call as soon as they can.

## 2019-05-28 NOTE — Telephone Encounter (Signed)
LMOM requesting call back to DC for assistance with post-implant [place] transmission. Direct number given.  Monitor last communicated with network on 05/28/19, but last alerts check was on 05/26/19 so pt may need to move monitor closer to where she sleeps.

## 2019-05-29 ENCOUNTER — Encounter (HOSPITAL_COMMUNITY): Payer: Self-pay | Admitting: Internal Medicine

## 2019-05-29 NOTE — Telephone Encounter (Signed)
Spoke w/ pt and requested that she send a manual transmission w/ her home monitor. Patient stated that the monitor is about 3 feet from where she sleeps. Instructed her how to send a manual transmission. Transmission received.

## 2019-05-29 NOTE — Telephone Encounter (Signed)
Transmission reviewed. Normal device function. Sensing and impedance trends stable, RV threshold test is not programmed to automatic. Vp 1.8%. No HVR episodes.  Spoke with patient. Advised of normal device function. Pt reports PPM site is doing well, Steri-strips intact, minimal discomfort. ILR explant site is sore, some redness noted but has been present and stable since the hospital per pt. Pt reports Dr. Lovena Le told her it would be sore for the first week or so. She has not removed occlusive dressing from ILR explant site and wishes to keep it in place for now due to tenderness. She denies drainage, worsening redness or swelling, fever/chills. Pt has access to RN at [facility] if necessary and she agrees call us for any signs/symptoms of infection. Pt made aware of upcoming wound check appointment on 06/05/19. No further questions at this time.

## 2019-06-01 ENCOUNTER — Other Ambulatory Visit: Payer: Self-pay | Admitting: Internal Medicine

## 2019-06-05 ENCOUNTER — Other Ambulatory Visit: Payer: Self-pay

## 2019-06-05 ENCOUNTER — Ambulatory Visit (INDEPENDENT_AMBULATORY_CARE_PROVIDER_SITE_OTHER): Payer: Medicare Other | Admitting: *Deleted

## 2019-06-05 ENCOUNTER — Telehealth: Payer: Self-pay

## 2019-06-05 DIAGNOSIS — I442 Atrioventricular block, complete: Secondary | ICD-10-CM

## 2019-06-05 LAB — CUP PACEART INCLINIC DEVICE CHECK
Battery Remaining Longevity: 138 mo
Battery Voltage: 3.08 V
Brady Statistic RV Percent Paced: 1.5 %
Date Time Interrogation Session: 20200730173004
Implantable Lead Implant Date: 20200720
Implantable Lead Location: 753860
Implantable Pulse Generator Implant Date: 20200720
Lead Channel Impedance Value: 637.5 Ohm
Lead Channel Pacing Threshold Amplitude: 0.5 V
Lead Channel Pacing Threshold Pulse Width: 0.5 ms
Lead Channel Sensing Intrinsic Amplitude: 10.2 mV
Lead Channel Setting Pacing Amplitude: 0.75 V
Lead Channel Setting Pacing Pulse Width: 0.5 ms
Lead Channel Setting Sensing Sensitivity: 2 mV
Pulse Gen Model: 1272
Pulse Gen Serial Number: 9122450

## 2019-06-05 NOTE — Telephone Encounter (Signed)
    COVID-19 Pre-Screening Questions:  . In the past 7 to 10 days have you had a cough,  shortness of breath, headache, congestion, fever (100 or greater) body aches, chills, sore throat, or sudden loss of taste or sense of smell? No . Have you been around anyone with known Covid 19. No . Have you been around anyone who is awaiting Covid 19 test results in the past 7 to 10 days? No . Have you been around anyone who has been exposed to Covid 19, or has mentioned symptoms of Covid 19 within the past 7 to 10 days? NO  If you have any concerns/questions about symptoms patients report during screening (either on the phone or at threshold). Contact the provider seeing the patient or DOD for further guidance.  If neither are available contact a member of the leadership team.       Pt answered No to all covid-19 prescreening questions. I asked the pt to wear a mask. I let the pt know we are reducing the number of people coming into the office and if she can physically come alone we asked that she do so. I also told her if she physically needs help she can bring 1 person and that person has to wear a mask. The pt verbalized understanding.

## 2019-06-05 NOTE — Progress Notes (Signed)
Wound check appointment. Steri-strips removed. Wound without redness or edema. Incision edges approximated, wound well healed. Normal device function. Thresholds, sensing, and impedances consistent with implant measurements. Device programmed with auto capture programmed on per industry. Histogram distribution appropriate for patient and level of activity. No high ventricular rates noted. Patient educated about wound care, arm mobility, lifting restrictions. F/u appointment with Dr Lovena Le 08/26/19 and next remote transmission scheduled for 11/26/18.

## 2019-06-24 DIAGNOSIS — Z85828 Personal history of other malignant neoplasm of skin: Secondary | ICD-10-CM | POA: Diagnosis not present

## 2019-06-24 DIAGNOSIS — L821 Other seborrheic keratosis: Secondary | ICD-10-CM | POA: Diagnosis not present

## 2019-06-24 DIAGNOSIS — D0471 Carcinoma in situ of skin of right lower limb, including hip: Secondary | ICD-10-CM | POA: Diagnosis not present

## 2019-06-24 DIAGNOSIS — L820 Inflamed seborrheic keratosis: Secondary | ICD-10-CM | POA: Diagnosis not present

## 2019-06-24 DIAGNOSIS — L570 Actinic keratosis: Secondary | ICD-10-CM | POA: Diagnosis not present

## 2019-07-09 ENCOUNTER — Encounter: Payer: Self-pay | Admitting: Internal Medicine

## 2019-07-09 ENCOUNTER — Other Ambulatory Visit: Payer: Self-pay

## 2019-07-09 ENCOUNTER — Non-Acute Institutional Stay: Payer: Medicare Other | Admitting: Internal Medicine

## 2019-07-09 VITALS — BP 120/80 | HR 88 | Temp 98.6°F | Ht 66.0 in | Wt 128.0 lb

## 2019-07-09 DIAGNOSIS — I442 Atrioventricular block, complete: Secondary | ICD-10-CM

## 2019-07-09 DIAGNOSIS — I2510 Atherosclerotic heart disease of native coronary artery without angina pectoris: Secondary | ICD-10-CM | POA: Diagnosis not present

## 2019-07-09 DIAGNOSIS — M150 Primary generalized (osteo)arthritis: Secondary | ICD-10-CM

## 2019-07-09 DIAGNOSIS — M159 Polyosteoarthritis, unspecified: Secondary | ICD-10-CM

## 2019-07-09 DIAGNOSIS — M11269 Other chondrocalcinosis, unspecified knee: Secondary | ICD-10-CM

## 2019-07-09 DIAGNOSIS — M542 Cervicalgia: Secondary | ICD-10-CM

## 2019-07-09 DIAGNOSIS — M19012 Primary osteoarthritis, left shoulder: Secondary | ICD-10-CM | POA: Diagnosis not present

## 2019-07-09 DIAGNOSIS — R590 Localized enlarged lymph nodes: Secondary | ICD-10-CM

## 2019-07-09 DIAGNOSIS — M8949 Other hypertrophic osteoarthropathy, multiple sites: Secondary | ICD-10-CM

## 2019-07-09 NOTE — Progress Notes (Signed)
Location:  [facility] clinic Provider: Tinita Brooker L. Mariea Clonts, D.O., C.M.D.  Code Status: DNR Goals of Care:  Advanced Directives 05/26/2019  Does Patient Have a Medical Advance Directive? Yes  Type of Paramedic of [place];Living will  Does patient want to make changes to medical advance directive? No - Patient declined  Copy of [place] in Chart? No - copy requested  Pre-existing out of facility DNR order (yellow form or pink MOST form) -   Chief Complaint  Patient presents with  . Acute Visit    head and neck discomfort    HPI: Patient is a 85 y.o. female seen today for an acute visit for possible swelling in the glands in her right neck.  When going to bed, she was getting a pain from her forehead, down her scull and into her neck.  Didn't bother in day and not that bad.  Got up early this am, decided to go back to bed.  She's had some headaches related to it.  No visual changes.  No jaw claudication.  Adjusted her pillow to try to help which seemed to help, but then it got worse.  No pain with turning head.    Has been told she had pseudogout a few times.  Goes back and forth and moves around b/w the knees.  Sometimes keeps her awake.    She has trouble with her right hand and arm for 50 years.  Seems like after an hour or so looking at paper, right hand gets numb and tingly, may creep up to elbow and shoulder even, but then goes away by lunch.  She used to think it was from holding her daughter at night.    Last week, her left shoulder was suddenly painful after no particular injury.    She had her pacemaker implanted 7/20.  She stayed overnight in rehab.  It came out well.  The next day she went home and no problem since.  Had the nurse to take off the steristrips.  Sees Dr. Lovena Le in Oct.  Past Medical History:  Diagnosis Date  . Actinic keratosis 05/25/2014  . Anxiety   . Atrial fibrillation ([place]) 09/21/2010  . Basal cell carcinoma 05/25/2014    Multiple removed by Dr. Danella Sensing in March 2015: right neck, left neck, scalp   . Bell's palsy 07/18/1979  . Cervicalgia 01/31/2012  . Closed fracture of lumbar vertebra without mention of spinal cord injury 07/17/2005  . Conjunctiva disorder 12/26/2010  . Coronary atherosclerosis of native coronary artery 07/17/1997  . Cramp of limb 08/16/2009  . Degeneration of lumbar or lumbosacral intervertebral disc 01/31/2012  . Disturbance of skin sensation 08/2009  . Dizziness and giddiness 11/08/2011   vertigo  . External hemorrhoids without mention of complication 123XX123  . External hemorrhoids without mention of complication [id number]  . Ganglion of tendon sheath 08/16/2009  . Leg cramp 10/27/2013   Most frequently the right leg.   . Long term (current) use of anticoagulants 09/2010  . Lumbago 07/2009  . Meralgia paresthetica 07/2007  . MI, old   . Myalgia and myositis, unspecified 01/17/2012  . Osteoporosis   . Other abnormal blood chemistry 04/08/2012  . Other abnormal blood chemistry 2013   hyperglycemia  . Other disorder of muscle, ligament, and fascia 04/08/2012  . Other specified cardiac dysrhythmias(427.89) 06/27/2010  . Pain in joint, ankle and foot 10/27/2013   Bilateral since 1998   . Pain in joint, shoulder region 08/12/2012  .  Pain in joint, upper arm 12/26/2010  . Pain in limb 01/11/2011  . Pain in thoracic spine 01/31/2012  . 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 PVC's (premature ventricular contractions)   . Rash and other nonspecific skin eruption 08/02/2011  . Senile osteoporosis 07/17/1993  . Unspecified essential hypertension 07/17/1997  . Varicose veins of lower extremities 08/16/2009  . Varicose veins of lower extremities 08/16/2009    Past Surgical History:  Procedure Laterality Date  . COLONOSCOPY WITH PROPOFOL N/A 10/19/2015   Procedure: COLONOSCOPY WITH PROPOFOL;  Surgeon: Gatha Mayer, MD;  Location: [facility];  Service: Endoscopy;  Laterality: N/A;  .  CORONARY ARTERY BYPASS GRAFT  1998   x2; LIMA to LAD; SVG to diagonal off bypass  . HEMORRHOID SURGERY  08/26/2012   Dr. Brantley Stage  . LOOP RECORDER INSERTION N/A 05/13/2018   Procedure: LOOP RECORDER INSERTION;  Surgeon: [name], MD;  Location: [place] [facility];  Service: Cardiovascular;  Laterality: N/A;  . LOOP RECORDER REMOVAL N/A 05/26/2019   Procedure: LOOP RECORDER REMOVAL;  Surgeon: [name], MD;  Location: [place] [facility];  Service: Cardiovascular;  Laterality: N/A;  . MASS EXCISION Left 11/23/2015   Procedure: LEFT WRIST EXCISION CYST;  Surgeon: Leanora Cover, MD;  Location: [place] [place];  Service: Orthopedics;  Laterality: Left;  . PACEMAKER IMPLANT N/A 05/26/2019   Procedure: PACEMAKER IMPLANT;  Surgeon: [name], MD;  Location: [place] [facility];  Service: Cardiovascular;  Laterality: N/A;  . SKIN BIOPSY  01/29/14   (R) neck; (R) scalp, 2 (L) neck; shave biopsy Superficial basal cell carcinoma Dr. Danella Sensing  . TONSILLECTOMY  1940's    Allergies  Allergen Reactions  . Iodinated Diagnostic Agents Nausea Only and Other (See Comments)    Severe nausea and also passed out  . Bactrim Rash  . Relafen [Nabumetone] Rash    Outpatient Encounter Medications as of 07/09/2019  Medication Sig  . acetaminophen (TYLENOL) 500 MG tablet Take 500-1,000 mg by mouth every 6 (six) hours as needed (pain.).  Marland Kitchen atenolol (TENORMIN) 25 MG tablet TAKE 1 TABLET DAILY  . calcium-vitamin D (OSCAL WITH D) 500-200 MG-UNIT tablet Take 1 tablet by mouth daily.  . Cholecalciferol (VITAMIN D3) 50 MCG (2000 UT) TABS Take 2,000 Units by mouth daily.  . digoxin (LANOXIN) 0.125 MG tablet Take 1 tablet (125 mcg total) by mouth every other day.  Marland Kitchen ELIQUIS 2.5 MG TABS tablet TAKE 1 TABLET TWICE A DAY  . hydrochlorothiazide (HYDRODIURIL) 25 MG tablet TAKE 1 TABLET ONCE DAILY  . LORazepam (ATIVAN) 1 MG tablet TAKE 1 TABLET AT BEDTIME TO DECREASE ANXIETY AND HELP WITH SLEEP  . Oyster  Shell (OYSTER CALCIUM) 500 MG TABS tablet Take 500 mg of elemental calcium by mouth 2 (two) times daily.   No facility-administered encounter medications on file as of 07/09/2019.     Review of Systems:  Review of Systems  Constitutional: Negative for chills, fever and malaise/fatigue.       No sweats  HENT: Positive for hearing loss.   Eyes: Negative for blurred vision.  Respiratory: Negative for cough and shortness of breath.   Cardiovascular: Negative for chest pain, palpitations and leg swelling.  Gastrointestinal: Negative for abdominal pain, blood in stool, constipation, diarrhea and melena.  Genitourinary: Negative for dysuria.  Musculoskeletal: Positive for joint pain, myalgias and neck pain. Negative for falls.  Skin: Negative for itching and rash.       Had skin  cancer frozen off on right foot, then to use a cream on it after the scab falls off  Neurological: Negative for dizziness and loss of consciousness.  Endo/Heme/Allergies: Bruises/bleeds easily.  Psychiatric/Behavioral: Negative for depression and memory loss. The patient is not nervous/anxious and does not have insomnia.     Health Maintenance  Topic Date Due  . TETANUS/TDAP  10/06/2017  . INFLUENZA VACCINE  06/07/2019  . DEXA SCAN  Completed  . PNA vac Low Risk Adult  Completed    Physical Exam: Vitals:   07/09/19 1452  BP: 120/80  Pulse: 88  Temp: 98.6 F (37 C)  TempSrc: Oral  SpO2: 97%  Weight: 128 lb (58.1 kg)  Height: 5\' 6"  (1.676 m)   Body mass index is 20.66 kg/m. Physical Exam Vitals signs reviewed.  Constitutional:      General: She is not in acute distress.    Appearance: Normal appearance. She is normal weight. She is not toxic-appearing.  HENT:     Head: Normocephalic and atraumatic.  Eyes:     General: No scleral icterus.       Right eye: No discharge.        Left eye: No discharge.     Extraocular Movements: Extraocular movements intact.     Conjunctiva/sclera: Conjunctivae  normal.     Pupils: Pupils are equal, round, and reactive to light.  Neck:     Comments: Right posterior cervical lymph node quite superficial Cardiovascular:     Rate and Rhythm: Normal rate. Rhythm irregular.     Pulses: Normal pulses.     Heart sounds: Normal heart sounds.  Pulmonary:     Effort: Pulmonary effort is normal.     Breath sounds: Normal breath sounds. No wheezing, rhonchi or rales.  Abdominal:     General: Bowel sounds are normal.  Musculoskeletal: Normal range of motion.        General: No swelling, tenderness, deformity or signs of injury.     Right lower leg: No edema.     Left lower leg: No edema.     Comments: Neck ROM wnl, no reproducible pain with ROM, no active headache  Skin:    General: Skin is warm and dry.     Coloration: Skin is pale.  Neurological:     General: No focal deficit present.     Mental Status: She is alert and oriented to person, place, and time. Mental status is at baseline.  Psychiatric:        Mood and Affect: Mood normal.     Labs reviewed: Basic Metabolic Panel: Recent Labs    08/16/18 1502 03/25/19 0800 05/26/19 0812  NA 142 141 135  K 3.7 3.4 3.2*  CL 95*  --  99  CO2 30*  --  26  GLUCOSE 106*  --  103*  BUN 22 26* 24*  CREATININE 1.10* 1.1 0.97  CALCIUM 9.7  --  9.4   Liver Function Tests: Recent Labs    03/25/19 0800  AST 20  ALT 10  ALKPHOS 68   No results for input(s): LIPASE, AMYLASE in the last 8760 hours. No results for input(s): AMMONIA in the last 8760 hours. CBC: Recent Labs    08/16/18 1502 03/25/19 05/26/19 0812  WBC 8.2 4.6 5.1  NEUTROABS 3.7  --   --   HGB 12.9 13.1 11.9*  HCT 39.6 39 36.7  MCV 89  --  89.3  PLT 124* 106* 108*   Lipid Panel: Recent  Labs    03/25/19 0800  CHOL 170  HDL 53  LDLCALC 106  TRIG 54   Lab Results  Component Value Date   HGBA1C 6.0 03/25/2019    Assessment/Plan 1. Cervicalgia -suspect headache is cervicogenic -given red flags of visual changes  and jaw claudication in case of GCA, but does not have typical concerns to suggest and neck hurting at same time -tylenol, topicals, monitor  2. Arthritis of left shoulder region -suspect cause of some short-lived pain she gets off and on  3. Lymphadenopathy, posterior cervical -right side of neck, nontender, does not seem matted or firm -monitor for resolution  4. Primary osteoarthritis involving multiple joints -shoulders, knees, neck -use tylenol, topicals, maintain some exercise to lubricate joints  5. Pseudogout of knee, unspecified laterality -both knees -use conservative mgt with tylenol,topicals, ice if bothered  6. Complete AV block -s/p pacemaker placement--doing fine  Labs/tests ordered:  Cbc next week due to pallor and lymph node  Next appt:  She will call for next appt in 4-6 mos or sooner if above concerns do not improve  Zuleyma Scharf L. Jemar Paulsen, D.O. [facility] [address], [place] [address] Cell Phone (Mon-Fri 8am-5pm):  [phone] On Call:  [phone] & follow prompts after 5pm & weekends Office Phone:  [phone] Office Fax:  [phone]

## 2019-07-09 NOTE — Patient Instructions (Addendum)
Please keep an eye on your right-sided lymph node.  If it gets bigger or becomes hard and matted, let me know.  It should resolve within about 4 weeks.  Also, watch out for any changes in vision in your right eye or soreness in the jaw when chewing which would point toward temporal arteritis.  However, I suspect your headache is related to arthritis in your neck.  Similarly, I suspect your left shoulder and knees have some arthritis pain.  Please take your tylenol at bedtime to manage all of these pains.  If things get worse, let me know.  Keep it up with some exercise to keep the joints lubricated.

## 2019-07-15 DIAGNOSIS — D649 Anemia, unspecified: Secondary | ICD-10-CM | POA: Diagnosis not present

## 2019-07-15 DIAGNOSIS — E1122 Type 2 diabetes mellitus with diabetic chronic kidney disease: Secondary | ICD-10-CM | POA: Diagnosis not present

## 2019-07-15 LAB — CBC AND DIFFERENTIAL
HCT: 37 (ref 36–46)
Hemoglobin: 12.1 (ref 12.0–16.0)
Neutrophils Absolute: 2
Platelets: 108 — AB (ref 150–399)
WBC: 5

## 2019-07-21 DIAGNOSIS — H2513 Age-related nuclear cataract, bilateral: Secondary | ICD-10-CM | POA: Diagnosis not present

## 2019-07-21 DIAGNOSIS — H5203 Hypermetropia, bilateral: Secondary | ICD-10-CM | POA: Diagnosis not present

## 2019-07-30 ENCOUNTER — Telehealth: Payer: Self-pay

## 2019-07-30 NOTE — Telephone Encounter (Signed)
As written on the lab, pt has had improvement in anemia.  She continues with low platelets.  No concerning findings related to her possible lymph node she was concerned about.  Continue to monitor.  Please call her with results.  Drake Wuertz L. Fishel Wamble, D.O. [facility] [address], [place] [address] Cell Phone (Mon-Fri 8am-5pm):  [phone] On Call:  [phone] & follow prompts after 5pm & weekends Office Phone:  [phone] Office Fax:  [phone]

## 2019-07-30 NOTE — Telephone Encounter (Signed)
Labs were mailed to patient.

## 2019-07-30 NOTE — Telephone Encounter (Signed)
Patient called with results of CBC and recommendations per Dr. Mariea Clonts to continue to monitor.  Patient asked for a written copy of recommendations so that she could have it for her file and show to her daughter.

## 2019-07-30 NOTE — Telephone Encounter (Addendum)
Handwritten note was left on Dr.Reed's desk by [place] indicating patient would like information from labs taken last week.   Per Dr.Reed recalls given labs to medical assistant in office to abstract. Labs are not currently in the system.  Shelton Silvas please advise if you have these labs and if yes please abstract and advise Dr.Reed when complete  Thanks  S.Chrae B/CMA

## 2019-07-30 NOTE — Telephone Encounter (Signed)
Lab results have been abstracted.

## 2019-08-03 ENCOUNTER — Other Ambulatory Visit: Payer: Self-pay | Admitting: Internal Medicine

## 2019-08-04 NOTE — Telephone Encounter (Signed)
° ° ° °*  STAT* If patient is at the pharmacy, call can be transferred to refill team.   1. Which medications need to be refilled? (please list name of each medication and dose if known) digoxin (LANOXIN) 0.125 MG tablet 2. Which pharmacy/location (including street and city if local pharmacy) is medication to be sent to?[facility] [place] [place], [place] AT Portal to Registered [employer] Sites  3. Do they need a 30 day or 90 day supply? [place]

## 2019-08-05 ENCOUNTER — Other Ambulatory Visit: Payer: Self-pay | Admitting: *Deleted

## 2019-08-05 MED ORDER — APIXABAN 2.5 MG PO TABS: 2.5000 mg | ORAL_TABLET | Freq: Two times a day (BID) | ORAL | 2 refills | Status: DC

## 2019-08-05 NOTE — Telephone Encounter (Signed)
Eliquis 2.5mg  refill request received; pt is 85yrs old, weight-59.9kg, Crea-0.97 on 05/26/2019, Diagnosis-Afib, and last seen by Dr. Lovena Le on 05/15/2019. Dose is appropriate based on dosing criteria. Will send in refill to requested pharmacy.

## 2019-08-08 ENCOUNTER — Other Ambulatory Visit: Payer: Self-pay | Admitting: Internal Medicine

## 2019-08-08 MED ORDER — DIGOXIN 125 MCG PO TABS: 125.0000 ug | ORAL_TABLET | ORAL | 0 refills | Status: DC

## 2019-08-08 NOTE — Telephone Encounter (Signed)
Pt needed a short supply until mail order arrives. Confirmation received.

## 2019-08-21 DIAGNOSIS — Z23 Encounter for immunization: Secondary | ICD-10-CM | POA: Diagnosis not present

## 2019-08-26 ENCOUNTER — Encounter: Payer: Medicare Other | Admitting: Internal Medicine

## 2019-08-27 DIAGNOSIS — H02055 Trichiasis without entropian left lower eyelid: Secondary | ICD-10-CM | POA: Diagnosis not present

## 2019-08-27 DIAGNOSIS — H2513 Age-related nuclear cataract, bilateral: Secondary | ICD-10-CM | POA: Diagnosis not present

## 2019-08-28 ENCOUNTER — Ambulatory Visit (INDEPENDENT_AMBULATORY_CARE_PROVIDER_SITE_OTHER): Payer: Medicare Other | Admitting: *Deleted

## 2019-08-28 DIAGNOSIS — I442 Atrioventricular block, complete: Secondary | ICD-10-CM

## 2019-08-28 DIAGNOSIS — I4821 Permanent atrial fibrillation: Secondary | ICD-10-CM

## 2019-08-28 LAB — CUP PACEART REMOTE DEVICE CHECK
Battery Remaining Longevity: 113 mo
Battery Remaining Percentage: 95.5 %
Battery Voltage: 3.04 V
Brady Statistic RV Percent Paced: 1.9 %
Date Time Interrogation Session: 20201022080012
Implantable Lead Implant Date: 20200720
Implantable Lead Location: 753860
Implantable Pulse Generator Implant Date: 20200720
Lead Channel Impedance Value: 580 Ohm
Lead Channel Pacing Threshold Amplitude: 0.5 V
Lead Channel Pacing Threshold Pulse Width: 0.5 ms
Lead Channel Sensing Intrinsic Amplitude: 8.8 mV
Lead Channel Setting Pacing Amplitude: 5 V
Lead Channel Setting Pacing Pulse Width: 0.5 ms
Lead Channel Setting Sensing Sensitivity: 2 mV
Pulse Gen Model: 1272
Pulse Gen Serial Number: 9122450

## 2019-09-01 ENCOUNTER — Telehealth: Payer: Self-pay | Admitting: Internal Medicine

## 2019-09-01 NOTE — Telephone Encounter (Signed)
Returned call to Pt.  Advised ok for dental work.

## 2019-09-01 NOTE — Telephone Encounter (Signed)
New message   Pt calling stating that she has an appt with her dentist at the end of this week. She had a pacemaker implanted in July and remembers something about checking with doctor before having work done. She wants to know if she can go to the appointment, she believes it is for a rountine cleaning or does she need to postpone the appt

## 2019-09-02 ENCOUNTER — Encounter: Payer: Self-pay | Admitting: Family

## 2019-09-02 ENCOUNTER — Ambulatory Visit: Payer: Medicare Other | Admitting: Family

## 2019-09-02 ENCOUNTER — Other Ambulatory Visit: Payer: Self-pay

## 2019-09-02 DIAGNOSIS — Z23 Encounter for immunization: Secondary | ICD-10-CM

## 2019-09-02 DIAGNOSIS — Z0000 Encounter for general adult medical examination without abnormal findings: Secondary | ICD-10-CM

## 2019-09-02 MED ORDER — TETANUS-DIPHTH-ACELL PERTUSSIS 5-2-15.5 LF-MCG/0.5 IM SUSP: 0.5000 mL | Freq: Once | INTRAMUSCULAR | 0 refills | Status: AC

## 2019-09-02 NOTE — Patient Instructions (Signed)
Susan Conway , Thank you for taking time to come for your Medicare Wellness Visit. I appreciate your ongoing commitment to your health goals. Please review the following plan we discussed and let me know if I can assist you in the future.   Screening recommendations/referrals: Colonoscopy: N/A  Mammogram:  Bone Density: Up to date  Recommended yearly ophthalmology/optometry visit for glaucoma screening and checkup Recommended yearly dental visit for hygiene and checkup  Vaccinations: Influenza vaccine: Up to date  Pneumococcal vaccine : Up to date  Tdap vaccine: Ordered this visit. Shingles vaccine : Up to date    Advanced directives: Yes   Conditions/risks identified: Advanced Age female > 42 yrs, Hypertension   Next appointment: 1 year    Preventive Care 45 Years and Older, Female Preventive care refers to lifestyle choices and visits with your health care provider that can promote health and wellness. What does preventive care include?  A yearly physical exam. This is also called an annual well check.  Dental exams once or twice a year.  Routine eye exams. Ask your health care provider how often you should have your eyes checked.  Personal lifestyle choices, including:  Daily care of your teeth and gums.  Regular physical activity.  Eating a healthy diet.  Avoiding tobacco and drug use.  Limiting alcohol use.  Practicing safe sex.  Taking low-dose aspirin every day.  Taking vitamin and mineral supplements as recommended by your health care provider. What happens during an annual well check? The services and screenings done by your health care provider during your annual well check will depend on your age, overall health, lifestyle risk factors, and family history of disease. Counseling  Your health care provider may ask you questions about your:  Alcohol use.  Tobacco use.  Drug use.  Emotional well-being.  Home and relationship well-being.  Sexual  activity.  Eating habits.  History of falls.  Memory and ability to understand (cognition).  Work and work Statistician.  Reproductive health. Screening  You may have the following tests or measurements:  Height, weight, and BMI.  Blood pressure.  Lipid and cholesterol levels. These may be checked every 5 years, or more frequently if you are over 43 years old.  Skin check.  Lung cancer screening. You may have this screening every year starting at age 64 if you have a 30-pack-year history of smoking and currently smoke or have quit within the past 15 years.  Fecal occult blood test (FOBT) of the stool. You may have this test every year starting at age 46.  Flexible sigmoidoscopy or colonoscopy. You may have a sigmoidoscopy every 5 years or a colonoscopy every 10 years starting at age 2.  Hepatitis C blood test.  Hepatitis B blood test.  Sexually transmitted disease (STD) testing.  Diabetes screening. This is done by checking your blood sugar (glucose) after you have not eaten for a while (fasting). You may have this done every 1-3 years.  Bone density scan. This is done to screen for osteoporosis. You may have this done starting at age 63.  Mammogram. This may be done every 1-2 years. Talk to your health care provider about how often you should have regular mammograms. Talk with your health care provider about your test results, treatment options, and if necessary, the need for more tests. Vaccines  Your health care provider may recommend certain vaccines, such as:  Influenza vaccine. This is recommended every year.  Tetanus, diphtheria, and acellular pertussis (Tdap, Td) vaccine.  You may need a Td booster every 10 years.  Zoster vaccine. You may need this after age 56.  Pneumococcal 13-valent conjugate (PCV13) vaccine. One dose is recommended after age 28.  Pneumococcal polysaccharide (PPSV23) vaccine. One dose is recommended after age 58. Talk to your health care  provider about which screenings and vaccines you need and how often you need them. This information is not intended to replace advice given to you by your health care provider. Make sure you discuss any questions you have with your health care provider. Document Released: 11/19/2015 Document Revised: 07/12/2016 Document Reviewed: 08/24/2015 Elsevier Interactive Patient Education  2017 [place] Prevention in the Home Falls can cause injuries. They can happen to people of all ages. There are many things you can do to make your home safe and to help prevent falls. What can I do on the outside of my home?  Regularly fix the edges of walkways and driveways and fix any cracks.  Remove anything that might make you trip as you walk through a door, such as a raised step or threshold.  Trim any bushes or trees on the path to your home.  Use bright outdoor lighting.  Clear any walking paths of anything that might make someone trip, such as rocks or tools.  Regularly check to see if handrails are loose or broken. Make sure that both sides of any steps have handrails.  Any raised decks and porches should have guardrails on the edges.  Have any leaves, snow, or ice cleared regularly.  Use sand or salt on walking paths during winter.  Clean up any spills in your garage right away. This includes oil or grease spills. What can I do in the bathroom?  Use night lights.  Install grab bars by the toilet and in the tub and shower. Do not use towel bars as grab bars.  Use non-skid mats or decals in the tub or shower.  If you need to sit down in the shower, use a plastic, non-slip stool.  Keep the floor dry. Clean up any water that spills on the floor as soon as it happens.  Remove soap buildup in the tub or shower regularly.  Attach bath mats securely with double-sided non-slip rug tape.  Do not have throw rugs and other things on the floor that can make you trip. What can I do in  the bedroom?  Use night lights.  Make sure that you have a light by your bed that is easy to reach.  Do not use any sheets or blankets that are too big for your bed. They should not hang down onto the floor.  Have a firm chair that has side arms. You can use this for support while you get dressed.  Do not have throw rugs and other things on the floor that can make you trip. What can I do in the kitchen?  Clean up any spills right away.  Avoid walking on wet floors.  Keep items that you use a lot in easy-to-reach places.  If you need to reach something above you, use a strong step stool that has a grab bar.  Keep electrical cords out of the way.  Do not use floor polish or wax that makes floors slippery. If you must use wax, use non-skid floor wax.  Do not have throw rugs and other things on the floor that can make you trip. What can I do with my stairs?  Do not leave any  items on the stairs.  Make sure that there are handrails on both sides of the stairs and use them. Fix handrails that are broken or loose. Make sure that handrails are as long as the stairways.  Check any carpeting to make sure that it is firmly attached to the stairs. Fix any carpet that is loose or worn.  Avoid having throw rugs at the top or bottom of the stairs. If you do have throw rugs, attach them to the floor with carpet tape.  Make sure that you have a light switch at the top of the stairs and the bottom of the stairs. If you do not have them, ask someone to add them for you. What else can I do to help prevent falls?  Wear shoes that:  Do not have high heels.  Have rubber bottoms.  Are comfortable and fit you well.  Are closed at the toe. Do not wear sandals.  If you use a stepladder:  Make sure that it is fully opened. Do not climb a closed stepladder.  Make sure that both sides of the stepladder are locked into place.  Ask someone to hold it for you, if possible.  Clearly mark and  make sure that you can see:  Any grab bars or handrails.  First and last steps.  Where the edge of each step is.  Use tools that help you move around (mobility aids) if they are needed. These include:  Canes.  Walkers.  Scooters.  Crutches.  Turn on the lights when you go into a dark area. Replace any light bulbs as soon as they burn out.  Set up your furniture so you have a clear path. Avoid moving your furniture around.  If any of your floors are uneven, fix them.  If there are any pets around you, be aware of where they are.  Review your medicines with your doctor. Some medicines can make you feel dizzy. This can increase your chance of falling. Ask your doctor what other things that you can do to help prevent falls. This information is not intended to replace advice given to you by your health care provider. Make sure you discuss any questions you have with your health care provider. Document Released: 08/19/2009 Document Revised: 03/30/2016 Document Reviewed: 11/27/2014 Elsevier Interactive Patient Education  2017 [employer].

## 2019-09-02 NOTE — Progress Notes (Signed)
This service is provided via telemedicine  No vital signs collected/recorded due to the encounter was a telemedicine visit.   Location of patient (ex: home, work):  Home   Patient consents to a telephone visit:  Yes   Location of the provider (ex: office, home): Office   Name of any referring provider: Hollace Kinnier, [place] of all persons participating in the telemedicine service and their role in the encounter:  Dinah Ngetich NP, Ruthell Rummage CMA, and Everlean Alstrom   Time spent on call:  Ruthell Rummage CMA spent 13 minutes on phone with patient.

## 2019-09-02 NOTE — Progress Notes (Signed)
Subjective:   Susan Conway is a 85 y.o. female who presents for Medicare Annual (Subsequent) preventive examination.  Review of Systems:  Cardiac Risk Factors include: advanced age (>2men, >11 women);hypertension     Objective:     Vitals: There were no vitals taken for this visit.  There is no height or weight on file to calculate BMI.  Advanced Directives 05/26/2019 09/10/2018 03/20/2018 08/14/2017 03/31/2017 03/21/2017 05/29/2016  Does Patient Have a Medical Advance Directive? Yes Yes Yes Yes - Yes Yes  Type of Advance Directive [place];Living will [place];Out of facility DNR (pink MOST or yellow form) [place];Out of facility DNR (pink MOST or yellow form) [place];Living will - [place];Living will;Out of facility DNR (pink MOST or yellow form) [place];Living will  Does patient want to make changes to medical advance directive? No - Patient declined No - Patient declined No - Patient declined No - Patient declined - - -  Copy of [place] in Chart? No - copy requested Yes - validated most recent copy scanned in chart (See row information) Yes Yes Yes Yes -  Pre-existing out of facility DNR order (yellow form or pink MOST form) - Yellow form placed in chart (order not valid for inpatient use) Yellow form placed in chart (order not valid for inpatient use) - - - -    Tobacco Social History   Tobacco Use  Smoking Status Never Smoker  Smokeless Tobacco Never Used     Counseling given: Not Answered   Clinical Intake:  Pre-visit preparation completed: No  Pain : No/denies pain     BMI - recorded: 20.98 Nutritional Status: BMI of 19-24  Normal Nutritional Risks: None Diabetes: No  How often do you need to have someone help you when you read instructions, pamphlets, or other written materials from your doctor or pharmacy?: 1 - Never What  is the last grade level you completed in school?: College 4 Yrs  Interpreter Needed?: No  Information entered by :: Dinah Ngetich FNP-C  Past Medical History:  Diagnosis Date  . Actinic keratosis 05/25/2014  . Anxiety   . Atrial fibrillation ([place]) 09/21/2010  . Basal cell carcinoma 05/25/2014   Multiple removed by Dr. Danella Sensing in March 2015: right neck, left neck, scalp   . Bell's palsy 07/18/1979  . Cervicalgia 01/31/2012  . Closed fracture of lumbar vertebra without mention of spinal cord injury 07/17/2005  . Conjunctiva disorder 12/26/2010  . Coronary atherosclerosis of native coronary artery 07/17/1997  . Cramp of limb 08/16/2009  . Degeneration of lumbar or lumbosacral intervertebral disc 01/31/2012  . Disturbance of skin sensation 08/2009  . Dizziness and giddiness 11/08/2011   vertigo  . External hemorrhoids without mention of complication 123XX123  . External hemorrhoids without mention of complication [id number]  . Ganglion of tendon sheath 08/16/2009  . Leg cramp 10/27/2013   Most frequently the right leg.   . Long term (current) use of anticoagulants 09/2010  . Lumbago 07/2009  . Meralgia paresthetica 07/2007  . MI, old   . Myalgia and myositis, unspecified 01/17/2012  . Osteoporosis   . Other abnormal blood chemistry 04/08/2012  . Other abnormal blood chemistry 2013   hyperglycemia  . Other disorder of muscle, ligament, and fascia 04/08/2012  . Other specified cardiac dysrhythmias(427.89) 06/27/2010  . Pain in joint, ankle and foot 10/27/2013   Bilateral since 1998   .  Pain in joint, shoulder region 08/12/2012  . Pain in joint, upper arm 12/26/2010  . Pain in limb 01/11/2011  . Pain in thoracic spine 01/31/2012  . 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 PVC's (premature ventricular contractions)   . Rash and other nonspecific skin eruption 08/02/2011  . Senile osteoporosis 07/17/1993  . Unspecified essential hypertension 07/17/1997  . Varicose veins of lower  extremities 08/16/2009  . Varicose veins of lower extremities 08/16/2009   Past Surgical History:  Procedure Laterality Date  . COLONOSCOPY WITH PROPOFOL N/A 10/19/2015   Procedure: COLONOSCOPY WITH PROPOFOL;  Surgeon: Gatha Mayer, MD;  Location: [facility];  Service: Endoscopy;  Laterality: N/A;  . CORONARY ARTERY BYPASS GRAFT  1998   x2; LIMA to LAD; SVG to diagonal off bypass  . HEMORRHOID SURGERY  08/26/2012   Dr. Brantley Stage  . LOOP RECORDER INSERTION N/A 05/13/2018   Procedure: LOOP RECORDER INSERTION;  Surgeon: [name], MD;  Location: [facility];  Service: Cardiovascular;  Laterality: N/A;  . LOOP RECORDER REMOVAL N/A 05/26/2019   Procedure: LOOP RECORDER REMOVAL;  Surgeon: [name], MD;  Location: [facility];  Service: Cardiovascular;  Laterality: N/A;  . MASS EXCISION Left 11/23/2015   Procedure: LEFT WRIST EXCISION CYST;  Surgeon: Leanora Cover, MD;  Location: [facility];  Service: Orthopedics;  Laterality: Left;  . PACEMAKER IMPLANT N/A 05/26/2019   Procedure: PACEMAKER IMPLANT;  Surgeon: [name], MD;  Location: [facility];  Service: Cardiovascular;  Laterality: N/A;  . SKIN BIOPSY  01/29/14   (R) neck; (R) scalp, 2 (L) neck; shave biopsy Superficial basal cell carcinoma Dr. Danella Sensing  . TONSILLECTOMY  1940's   Family History  Problem Relation Age of Onset  . Heart attack Father 103  . Heart disease Father   . Hypertension Sister   . Heart disease Sister   . Heart disease Brother    Social History   Socioeconomic History  . Marital status: Widowed    Spouse name: Not on file  . Number of children: Not on file  . Years of education: Not on file  . Highest education level: Not on file  Occupational History  . Occupation: Retired Tour manager  . Financial resource strain: Not hard at all  . Food insecurity    Worry: Never true    Inability: Never true  . Transportation needs    Medical: No     Non-medical: No  Tobacco Use  . Smoking status: Never Smoker  . Smokeless tobacco: Never Used  Substance and Sexual Activity  . Alcohol use: Yes    Comment: occasional wine  . Drug use: No  . Sexual activity: Never  Lifestyle  . Physical activity    Days per week: 4 days    Minutes per session: 30 min  . Stress: Not at all  Relationships  . Social connections    Talks on phone: More than three times a week    Gets together: More than three times a week    Attends religious service: More than 4 times per year    Active member of club or organization: No    Attends meetings of clubs or organizations: Never    Relationship status: Widowed  Other Topics Concern  . Not on file  Social History Narrative   Living at [facility] since 2010   Widowed 09/03/2016   Never smoked   Alcohol occasional wine  Exercise, house work    DNR , POA, Living Will    Outpatient Encounter Medications as of 09/02/2019  Medication Sig  . acetaminophen (TYLENOL) 500 MG tablet Take 500-1,000 mg by mouth every 6 (six) hours as needed (pain.).  Marland Kitchen apixaban (ELIQUIS) 2.5 MG TABS tablet Take 1 tablet (2.5 mg total) by mouth 2 (two) times daily.  Marland Kitchen atenolol (TENORMIN) 25 MG tablet TAKE 1 TABLET DAILY  . calcium-vitamin D (OSCAL WITH D) 500-200 MG-UNIT tablet Take 1 tablet by mouth daily.  . Cholecalciferol (VITAMIN D3) 50 MCG (2000 UT) TABS Take 2,000 Units by mouth daily.  . digoxin (LANOXIN) 0.125 MG tablet Take 1 tablet (125 mcg total) by mouth every other day.  . hydrochlorothiazide (HYDRODIURIL) 25 MG tablet TAKE 1 TABLET ONCE DAILY  . LORazepam (ATIVAN) 1 MG tablet TAKE 1 TABLET AT BEDTIME TO DECREASE ANXIETY AND HELP WITH SLEEP  . Oyster Shell (OYSTER CALCIUM) 500 MG TABS tablet Take 500 mg of elemental calcium by mouth 2 (two) times daily.   No facility-administered encounter medications on file as of 09/02/2019.     Activities of Daily Living In your present state of health, do you have any  difficulty performing the following activities: 09/02/2019 09/10/2018  Hearing? N N  Vision? N N  Difficulty concentrating or making decisions? N N  Walking or climbing stairs? N N  Dressing or bathing? N N  Doing errands, shopping? N N  Preparing Food and eating ? N N  Using the Toilet? N N  In the past six months, have you accidently leaked urine? N N  Do you have problems with loss of bowel control? N N  Managing your Medications? N N  Managing your Finances? N N  Housekeeping or managing your Housekeeping? [place] assist -  Some recent data might be hidden    Patient Care Team: [name], DO as PCP - General (Geriatric Medicine) [name], MD as Consulting Physician (Dermatology) [name], MD as Consulting Physician (Cardiology) [name], MD as Consulting Physician (Dermatology)    Assessment:   This is a routine wellness examination for Susan Conway.  Exercise Activities and Dietary recommendations Current Exercise Habits: Home exercise routine, Type of exercise: walking, Time (Minutes): 30, Frequency (Times/Week): 3, Weekly Exercise (Minutes/Week): 90, Intensity: Mild  Goals    . Maintain Lifestyle     Starting today pt will maintain lifestyle.        Fall Risk Fall Risk  09/02/2019 04/02/2019 10/02/2018 09/10/2018 03/20/2018  Falls in the past year? 0 0 0 0 No  Number falls in past yr: 0 0 0 0 -  Comment - - - - -  Injury with Fall? 0 0 0 0 -   Is the patient's home free of loose throw rugs in walkways, pet beds, electrical cords, etc?   no      Grab bars in the bathroom? yes      Handrails on the stairs?   no stairs       Adequate lighting?   yes  Depression Screen PHQ 2/9 Scores 09/02/2019 04/02/2019 10/02/2018 09/10/2018  PHQ - 2 Score 0 0 0 0     Cognitive Function MMSE - Mini Mental State Exam 09/10/2018 08/14/2017 09/22/2015  Orientation to time 5 5 5   Orientation to Place 5 5 5    Registration 3 3 3   Attention/ Calculation 5 5 5   Recall 3 2 3   Language- name  2 objects 2 2 2   Language- repeat 1 1 1   Language- follow 3 step command 3 3 3   Language- read & follow direction 1 1 1   Write a sentence 1 1 1   Copy design 1 1 1   Total score 30 29 30      6CIT Screen 09/02/2019  What Year? 0 points  What month? 0 points  What time? 0 points  Count back from 20 0 points  Months in reverse 0 points  Repeat phrase 0 points  Total Score 0    Immunization History  Administered Date(s) Administered  . Influenza Whole 08/06/2012  . Influenza, High Dose Seasonal PF 08/15/2019  . Influenza,inj,Quad PF,6+ Mos 08/30/2018  . Influenza-Unspecified 08/06/2013, 08/24/2014, 08/26/2015, 08/31/2016, 08/27/2017  . Pneumococcal Conjugate-13 09/22/2015  . Pneumococcal Polysaccharide-23 08/29/2006  . Td 10/07/2007  . Zoster 03/12/2008  . Zoster Recombinat (Shingrix) 02/12/2018, 05/13/2018   Qualifies for Shingles Vaccine? Up to date   Screening Tests Health Maintenance  Topic Date Due  . TETANUS/TDAP  10/06/2017  . INFLUENZA VACCINE  Completed  . DEXA SCAN  Completed  . PNA vac Low Risk Adult  Completed    Cancer Screenings: Lung: Low Dose CT Chest recommended if Age 34-80 years, 30 pack-year currently smoking OR have quit w/in 15years. Patient does not qualify. Breast:  Up to date on Mammogram? Yes   Up to date of Bone Density/Dexa? Yes Colorectal: Aged out   Additional Screenings: Hepatitis C Screening: Low Risk      Plan:   - Tdap vaccine ordered this visit.   I have personally reviewed and noted the following in the patient's chart:   . Medical and social history . Use of alcohol, tobacco or illicit drugs  . Current medications and supplements . Functional ability and status . Nutritional status . Physical activity . Advanced directives . List of other physicians . Hospitalizations, surgeries, and ER visits in previous 12 months . Vitals . Screenings  to include cognitive, depression, and falls . Referrals and appointments  In addition, I have reviewed and discussed with patient certain preventive protocols, quality metrics, and best practice recommendations. A written personalized care plan for preventive services as well as general preventive health recommendations were provided to patient.  Sandrea Hughs, NP  09/02/2019

## 2019-09-09 NOTE — Progress Notes (Signed)
Remote pacemaker transmission.   

## 2019-09-15 ENCOUNTER — Encounter: Payer: Self-pay | Admitting: Internal Medicine

## 2019-09-18 DIAGNOSIS — H25811 Combined forms of age-related cataract, right eye: Secondary | ICD-10-CM | POA: Diagnosis not present

## 2019-09-18 DIAGNOSIS — H2511 Age-related nuclear cataract, right eye: Secondary | ICD-10-CM | POA: Diagnosis not present

## 2019-09-24 NOTE — Telephone Encounter (Signed)
error 

## 2019-10-01 ENCOUNTER — Encounter: Payer: Self-pay | Admitting: Internal Medicine

## 2019-10-09 DIAGNOSIS — H2512 Age-related nuclear cataract, left eye: Secondary | ICD-10-CM | POA: Diagnosis not present

## 2019-10-20 ENCOUNTER — Telehealth: Payer: Self-pay | Admitting: Internal Medicine

## 2019-10-20 NOTE — Telephone Encounter (Signed)
Patient is requesting her daughter to come to her appointment on 10/22/19 at 2:30. She states she just had cataracts surgery.

## 2019-10-22 ENCOUNTER — Ambulatory Visit (INDEPENDENT_AMBULATORY_CARE_PROVIDER_SITE_OTHER): Payer: Medicare Other | Admitting: Internal Medicine

## 2019-10-22 ENCOUNTER — Encounter: Payer: Self-pay | Admitting: Internal Medicine

## 2019-10-22 ENCOUNTER — Other Ambulatory Visit: Payer: Self-pay

## 2019-10-22 VITALS — BP 142/82 | HR 86 | Ht 66.0 in | Wt 131.0 lb

## 2019-10-22 DIAGNOSIS — Z950 Presence of cardiac pacemaker: Secondary | ICD-10-CM

## 2019-10-22 DIAGNOSIS — I2510 Atherosclerotic heart disease of native coronary artery without angina pectoris: Secondary | ICD-10-CM | POA: Diagnosis not present

## 2019-10-22 DIAGNOSIS — I4821 Permanent atrial fibrillation: Secondary | ICD-10-CM | POA: Diagnosis not present

## 2019-10-22 DIAGNOSIS — I10 Essential (primary) hypertension: Secondary | ICD-10-CM

## 2019-10-22 DIAGNOSIS — I472 Ventricular tachycardia, unspecified: Secondary | ICD-10-CM | POA: Insufficient documentation

## 2019-10-22 DIAGNOSIS — I442 Atrioventricular block, complete: Secondary | ICD-10-CM | POA: Diagnosis not present

## 2019-10-22 LAB — CUP PACEART INCLINIC DEVICE CHECK
Battery Remaining Longevity: 134 mo
Battery Voltage: 3.04 V
Brady Statistic RV Percent Paced: 2 %
Date Time Interrogation Session: 20201216155800
Implantable Lead Implant Date: 20200720
Implantable Lead Location: 753860
Implantable Pulse Generator Implant Date: 20200720
Lead Channel Impedance Value: 587.5 Ohm
Lead Channel Pacing Threshold Amplitude: 0.75 V
Lead Channel Pacing Threshold Pulse Width: 0.5 ms
Lead Channel Sensing Intrinsic Amplitude: 9.7 mV
Lead Channel Setting Pacing Amplitude: 1 V
Lead Channel Setting Pacing Pulse Width: 0.5 ms
Lead Channel Setting Sensing Sensitivity: 2 mV
Pulse Gen Model: 1272
Pulse Gen Serial Number: 9122450

## 2019-10-22 NOTE — Progress Notes (Signed)
HPI [name] returns today for followup of PAF and sinus node dysfunction, s/p PPM insertion. In the interim, she notes that she has had some leg cramps. She has not had syncope. No edema. She feels well except for the leg cramps.  Allergies  Allergen Reactions  . Iodinated Diagnostic Agents Nausea Only and Other (See Comments)    Severe nausea and also passed out  . Bactrim Rash  . Relafen [Nabumetone] Rash  . Sulfanilamide Rash     Current Outpatient Medications  Medication Sig Dispense Refill  . acetaminophen (TYLENOL) 500 MG tablet Take 500-1,000 mg by mouth every 6 (six) hours as needed (pain.).    Marland Kitchen apixaban (ELIQUIS) 2.5 MG TABS tablet Take 1 tablet (2.5 mg total) by mouth 2 (two) times daily. 180 tablet 2  . atenolol (TENORMIN) 25 MG tablet TAKE 1 TABLET DAILY 90 tablet 1  . calcium-vitamin D (OSCAL WITH D) 500-200 MG-UNIT tablet Take 1 tablet by mouth daily.    . Cholecalciferol (VITAMIN D3) 50 MCG (2000 UT) TABS Take 2,000 Units by mouth daily.    . digoxin (LANOXIN) 0.125 MG tablet Take 1 tablet (125 mcg total) by mouth every other day. 5 tablet 0  . hydrochlorothiazide (HYDRODIURIL) 25 MG tablet TAKE 1 TABLET ONCE DAILY 90 tablet 1  . LORazepam (ATIVAN) 1 MG tablet TAKE 1 TABLET AT BEDTIME TO DECREASE ANXIETY AND HELP WITH SLEEP 90 tablet 0  . Oyster Shell (OYSTER CALCIUM) 500 MG TABS tablet Take 500 mg of elemental calcium by mouth 2 (two) times daily.     No current facility-administered medications for this visit.     Past Medical History:  Diagnosis Date  . Actinic keratosis 05/25/2014  . Anxiety   . Atrial fibrillation ([place]) 09/21/2010  . Basal cell carcinoma 05/25/2014   Multiple removed by Dr. Danella Sensing in March 2015: right neck, left neck, scalp   . Bell's palsy 07/18/1979  . Cervicalgia 01/31/2012  . Closed fracture of lumbar vertebra without mention of spinal cord injury 07/17/2005  . Conjunctiva disorder 12/26/2010  . Coronary atherosclerosis of  native coronary artery 07/17/1997  . Cramp of limb 08/16/2009  . Degeneration of lumbar or lumbosacral intervertebral disc 01/31/2012  . Disturbance of skin sensation 08/2009  . Dizziness and giddiness 11/08/2011   vertigo  . External hemorrhoids without mention of complication 123XX123  . External hemorrhoids without mention of complication [id number]  . Ganglion of tendon sheath 08/16/2009  . Leg cramp 10/27/2013   Most frequently the right leg.   . Long term (current) use of anticoagulants 09/2010  . Lumbago 07/2009  . Meralgia paresthetica 07/2007  . MI, old   . Myalgia and myositis, unspecified 01/17/2012  . Osteoporosis   . Other abnormal blood chemistry 04/08/2012  . Other abnormal blood chemistry 2013   hyperglycemia  . Other disorder of muscle, ligament, and fascia 04/08/2012  . Other specified cardiac dysrhythmias(427.89) 06/27/2010  . Pain in joint, ankle and foot 10/27/2013   Bilateral since 1998   . Pain in joint, shoulder region 08/12/2012  . Pain in joint, upper arm 12/26/2010  . Pain in limb 01/11/2011  . Pain in thoracic spine 01/31/2012  . 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 PVC's (premature ventricular contractions)   . Rash and other nonspecific skin eruption 08/02/2011  . Senile osteoporosis 07/17/1993  . Unspecified essential hypertension 07/17/1997  . Varicose veins of lower extremities 08/16/2009  . Varicose veins of lower extremities 08/16/2009  ROS:   All systems reviewed and negative except as noted in the HPI.   Past Surgical History:  Procedure Laterality Date  . COLONOSCOPY WITH PROPOFOL N/A 10/19/2015   Procedure: COLONOSCOPY WITH PROPOFOL;  Surgeon: Gatha Mayer, MD;  Location: [facility];  Service: Endoscopy;  Laterality: N/A;  . CORONARY ARTERY BYPASS GRAFT  1998   x2; LIMA to LAD; SVG to diagonal off bypass  . HEMORRHOID SURGERY  08/26/2012   Dr. Brantley Stage  . LOOP RECORDER INSERTION N/A 05/13/2018   Procedure: LOOP RECORDER INSERTION;   Surgeon: [name], MD;  Location: [facility];  Service: Cardiovascular;  Laterality: N/A;  . LOOP RECORDER REMOVAL N/A 05/26/2019   Procedure: LOOP RECORDER REMOVAL;  Surgeon: [name], MD;  Location: [facility];  Service: Cardiovascular;  Laterality: N/A;  . MASS EXCISION Left 11/23/2015   Procedure: LEFT WRIST EXCISION CYST;  Surgeon: Leanora Cover, MD;  Location: [facility];  Service: Orthopedics;  Laterality: Left;  . PACEMAKER IMPLANT N/A 05/26/2019   Procedure: PACEMAKER IMPLANT;  Surgeon: [name], MD;  Location: [facility];  Service: Cardiovascular;  Laterality: N/A;  . SKIN BIOPSY  01/29/14   (R) neck; (R) scalp, 2 (L) neck; shave biopsy Superficial basal cell carcinoma Dr. Danella Sensing  . TONSILLECTOMY  1940's     Family History  Problem Relation Age of Onset  . Heart attack Father 58  . Heart disease Father   . Hypertension Sister   . Heart disease Sister   . Heart disease Brother      Social History   Socioeconomic History  . Marital status: Widowed    Spouse name: Not on file  . Number of children: Not on file  . Years of education: Not on file  . Highest education level: Not on file  Occupational History  . Occupation: Retired Pharmacist, hospital  Tobacco Use  . Smoking status: Never Smoker  . Smokeless tobacco: Never Used  Substance and Sexual Activity  . Alcohol use: Yes    Comment: occasional wine  . Drug use: No  . Sexual activity: Never  Other Topics Concern  . Not on file  Social History Narrative   Living at [facility] since 2010   Widowed 09/03/2016   Never smoked   Alcohol occasional wine   Exercise, house work    DNR , POA, Living Will   Social Determinants of Health   Financial Resource Strain:   . Difficulty of Paying Living Expenses: Not on file  Food Insecurity:   . Worried About Charity fundraiser in the Last Year: Not on file  . Ran Out of Food in the Last Year: Not on file   Transportation Needs:   . Lack of Transportation (Medical): Not on file  . Lack of Transportation (Non-Medical): Not on file  Physical Activity:   . Days of Exercise per Week: Not on file  . Minutes of Exercise per Session: Not on file  Stress:   . Feeling of Stress : Not on file  Social Connections:   . Frequency of Communication with Friends and Family: Not on file  . Frequency of Social Gatherings with Friends and Family: Not on file  . Attends Religious Services: Not on file  . Active Member of Clubs or Organizations: Not on file  . Attends Archivist Meetings: Not on file  . Marital Status: Not on file  Intimate Partner Violence:   .  Fear of Current or Ex-Partner: Not on file  . Emotionally Abused: Not on file  . Physically Abused: Not on file  . Sexually Abused: Not on file     BP (!) 142/82   Pulse 86   Ht 5\' 6"  (1.676 m)   Wt 131 lb (59.4 kg)   SpO2 100%   BMI 21.14 kg/m   Physical Exam:  Well appearing elderly woman,NAD HEENT: Unremarkable Neck:  6 cm JVD, no thyromegally Lymphatics:  No adenopathy Back:  No CVA tenderness Lungs:  Clear with no wheezes HEART:  Regular rate rhythm, no murmurs, no rubs, no clicks Abd:  soft, positive bowel sounds, no organomegally, no rebound, no guarding Ext:  2 plus pulses, no edema, no cyanosis, no clubbing Skin:  No rashes no nodules Neuro:  CN II through XII intact, motor grossly intact  EKG - atrial fib with RBBB  DEVICE  Normal device function.  See PaceArt for details.   Assess/Plan: 1. Atrial fib - her VR is well controlled.  2. Syncope - she has not had more syncope since her PPM was inserted. 3. HTN - her bp is a bit elevated but not usually at home. 4. Leg cramps - I asked her to stop her diuretic to see if that helped the cramps. She is to avoid salty foods.  Mikle Bosworth.D.

## 2019-10-22 NOTE — Patient Instructions (Addendum)
Medication Instructions:  Your physician has recommended you make the following change in your medication:   Hold your hydrochlorothiazide.  If your leg cramps improve, stop taking.  If your leg cramps do not improve, start taking it again.  *If you need a refill on your cardiac medications before your next appointment, please call your pharmacy*  Lab Work: None ordered.  If you have labs (blood work) drawn today and your tests are completely normal, you will receive your results only by: Marland Kitchen MyChart Message (if you have MyChart) OR . A paper copy in the mail If you have any lab test that is abnormal or we need to change your treatment, we will call you to review the results.  Testing/Procedures: None ordered.   Follow-Up: At [facility], you and your health needs are our priority.  As part of our continuing mission to provide you with exceptional heart care, we have created designated Provider Care Teams.  These Care Teams include your primary Cardiologist (physician) and Advanced Practice Providers (APPs -  Physician Assistants and Nurse Practitioners) who all work together to provide you with the care you need, when you need it.  Your next appointment:   Your physician wants you to follow-up in: July 2021 with Dr. Lovena Le.   You will receive a reminder letter in the mail two months in advance. If you don't receive a letter, please call our office to schedule the follow-up appointment.  Remote monitoring is used to monitor your Pacemaker from home. This monitoring reduces the number of office visits required to check your device to one time per year. It allows [place] to keep an eye on the functioning of your device to ensure it is working properly. You are scheduled for a device check from home on 11/27/2019. You may send your transmission at any time that day. If you have a wireless device, the transmission will be sent automatically. After your physician reviews your transmission, you  will receive a postcard with your next transmission date.

## 2019-10-28 ENCOUNTER — Telehealth: Payer: Self-pay

## 2019-10-28 NOTE — Telephone Encounter (Signed)
Called patient back and let her know Dr. Mariea Clonts felt she should get the vaccine and that details would be provided when available.

## 2019-10-28 NOTE — Telephone Encounter (Signed)
The patient has questions if she should get the COVID vaccine when available due to her history of cardiac disease.  She stated she heard that [facility] will get the Moderna vaccine.

## 2019-10-28 NOTE — Telephone Encounter (Signed)
[name] definitely should get the covid-19 vaccine when it is available for [facility] residents.  As we know more details, we will reach out with a plan.

## 2019-11-03 ENCOUNTER — Other Ambulatory Visit: Payer: Self-pay | Admitting: Internal Medicine

## 2019-11-03 NOTE — Telephone Encounter (Signed)
Last filled 04/28/2019 

## 2019-11-13 ENCOUNTER — Telehealth: Payer: Self-pay | Admitting: Internal Medicine

## 2019-11-13 NOTE — Telephone Encounter (Signed)
  Patient is calling because she is no longer taking any statin drugs and would like to know if she can eat grapefruit again. Can leave message on machine if no answer.

## 2019-11-14 NOTE — Telephone Encounter (Signed)
Advised can eat grapefruit.

## 2019-11-16 ENCOUNTER — Other Ambulatory Visit: Payer: Self-pay | Admitting: Internal Medicine

## 2019-11-26 LAB — CUP PACEART REMOTE DEVICE CHECK
Battery Remaining Longevity: 110 mo
Battery Remaining Percentage: 95.5 %
Battery Voltage: 3.02 V
Brady Statistic RV Percent Paced: 1.5 %
Date Time Interrogation Session: 20210120020013
Implantable Lead Implant Date: 20200720
Implantable Lead Location: 753860
Implantable Pulse Generator Implant Date: 20200720
Lead Channel Impedance Value: 550 Ohm
Lead Channel Pacing Threshold Amplitude: 0.75 V
Lead Channel Pacing Threshold Pulse Width: 0.5 ms
Lead Channel Sensing Intrinsic Amplitude: 10.2 mV
Lead Channel Setting Pacing Amplitude: 5 V
Lead Channel Setting Pacing Pulse Width: 0.5 ms
Lead Channel Setting Sensing Sensitivity: 2 mV
Pulse Gen Model: 1272
Pulse Gen Serial Number: 9122450

## 2019-11-27 ENCOUNTER — Ambulatory Visit (INDEPENDENT_AMBULATORY_CARE_PROVIDER_SITE_OTHER): Payer: Medicare Other | Admitting: *Deleted

## 2019-11-27 DIAGNOSIS — I442 Atrioventricular block, complete: Secondary | ICD-10-CM | POA: Diagnosis not present

## 2019-12-23 DIAGNOSIS — Z85828 Personal history of other malignant neoplasm of skin: Secondary | ICD-10-CM | POA: Diagnosis not present

## 2019-12-23 DIAGNOSIS — M793 Panniculitis, unspecified: Secondary | ICD-10-CM | POA: Diagnosis not present

## 2019-12-23 DIAGNOSIS — L821 Other seborrheic keratosis: Secondary | ICD-10-CM | POA: Diagnosis not present

## 2019-12-23 DIAGNOSIS — L245 Irritant contact dermatitis due to other chemical products: Secondary | ICD-10-CM | POA: Diagnosis not present

## 2019-12-23 DIAGNOSIS — L570 Actinic keratosis: Secondary | ICD-10-CM | POA: Diagnosis not present

## 2020-01-28 ENCOUNTER — Encounter: Payer: Self-pay | Admitting: Internal Medicine

## 2020-01-28 ENCOUNTER — Non-Acute Institutional Stay: Payer: Medicare Other | Admitting: Internal Medicine

## 2020-01-28 ENCOUNTER — Other Ambulatory Visit: Payer: Self-pay

## 2020-01-28 VITALS — BP 138/82 | HR 92 | Temp 97.7°F | Ht 66.0 in | Wt 138.6 lb

## 2020-01-28 DIAGNOSIS — D6869 Other thrombophilia: Secondary | ICD-10-CM | POA: Diagnosis not present

## 2020-01-28 DIAGNOSIS — I2510 Atherosclerotic heart disease of native coronary artery without angina pectoris: Secondary | ICD-10-CM | POA: Diagnosis not present

## 2020-01-28 DIAGNOSIS — M810 Age-related osteoporosis without current pathological fracture: Secondary | ICD-10-CM | POA: Diagnosis not present

## 2020-01-28 DIAGNOSIS — I4821 Permanent atrial fibrillation: Secondary | ICD-10-CM | POA: Diagnosis not present

## 2020-01-28 DIAGNOSIS — L853 Xerosis cutis: Secondary | ICD-10-CM

## 2020-01-28 DIAGNOSIS — I4891 Unspecified atrial fibrillation: Secondary | ICD-10-CM | POA: Diagnosis not present

## 2020-01-28 DIAGNOSIS — I8393 Asymptomatic varicose veins of bilateral lower extremities: Secondary | ICD-10-CM | POA: Diagnosis not present

## 2020-01-28 DIAGNOSIS — B351 Tinea unguium: Secondary | ICD-10-CM | POA: Diagnosis not present

## 2020-01-28 DIAGNOSIS — Z950 Presence of cardiac pacemaker: Secondary | ICD-10-CM

## 2020-01-28 NOTE — Progress Notes (Signed)
Location:  Occupational psychologist of Service:  Clinic (12)  Provider: Laine Fonner L. Mariea Clonts, D.O., C.M.D.  Code Status: DNR Goals of Care:  Advanced Directives 01/28/2020  Does Patient Have a Medical Advance Directive? Yes  Type of Advance Directive [place]  Does patient want to make changes to medical advance directive? No - Patient declined  Copy of [place] in Chart? Yes - validated most recent copy scanned in chart (See row information)  Pre-existing out of facility DNR order (yellow form or pink MOST form) -     Chief Complaint  Patient presents with  . Medical Management of Chronic Issues    6 month follow-up/ patient has list of questions for Dr Mariea Clonts     HPI: Patient is a 86 y.o. female seen today for medical management of chronic diseases and she has several concerns written down--minor annoyances/irritations she experiences.    Had her pacemaker in July, cataract surgeries in Nov and Dec.    Doing ok since then.    Cardiology stopped her hctz.  No increased edema.  Having trouble with fungus on the toenails.   Now and then legs will swell and be sore.   Her left great toenail is thickened and bothers her.  Wants it debrided.  Had been to Dr. Little Ishikawa in the past.  Saw Dr. Jacqualyn Posey more recently at [facility].  Says her mother and grandmother had the severe varicose veins she has.    Asks about best treatment for dry skin.  Asks about remedying dry mouth.  Asks about small right little toe callous.  [facility]  Reviewed meds and noted two calcium supplements--reports she's always taken, but appears the oyster shell one was just added officially.    Past Medical History:  Diagnosis Date  . Actinic keratosis 05/25/2014  . Anxiety   . Atrial fibrillation ([place]) 09/21/2010  . Basal cell carcinoma 05/25/2014   Multiple removed by Dr. Danella Sensing in March 2015: right neck, left neck, scalp    . Bell's palsy 07/18/1979  . Cervicalgia 01/31/2012  . Closed fracture of lumbar vertebra without mention of spinal cord injury 07/17/2005  . Conjunctiva disorder 12/26/2010  . Coronary atherosclerosis of native coronary artery 07/17/1997  . Cramp of limb 08/16/2009  . Degeneration of lumbar or lumbosacral intervertebral disc 01/31/2012  . Disturbance of skin sensation 08/2009  . Dizziness and giddiness 11/08/2011   vertigo  . External hemorrhoids without mention of complication 5/36/1443  . External hemorrhoids without mention of complication 15/40/0867  . Ganglion of tendon sheath 08/16/2009  . Leg cramp 10/27/2013   Most frequently the right leg.   . Long term (current) use of anticoagulants 09/2010  . Lumbago 07/2009  . Meralgia paresthetica 07/2007  . MI, old   . Myalgia and myositis, unspecified 01/17/2012  . Osteoporosis   . Other abnormal blood chemistry 04/08/2012  . Other abnormal blood chemistry 2013   hyperglycemia  . Other disorder of muscle, ligament, and fascia 04/08/2012  . Other specified cardiac dysrhythmias(427.89) 06/27/2010  . Pain in joint, ankle and foot 10/27/2013   Bilateral since 1998   . Pain in joint, shoulder region 08/12/2012  . Pain in joint, upper arm 12/26/2010  . Pain in limb 01/11/2011  . Pain in thoracic spine 01/31/2012  . 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 PVC's (premature ventricular contractions)   . Rash and other nonspecific skin eruption 08/02/2011  .  Senile osteoporosis 07/17/1993  . Unspecified essential hypertension 07/17/1997  . Varicose veins of lower extremities 08/16/2009  . Varicose veins of lower extremities 08/16/2009    Past Surgical History:  Procedure Laterality Date  . COLONOSCOPY WITH PROPOFOL N/A 10/19/2015   Procedure: COLONOSCOPY WITH PROPOFOL;  Surgeon: Gatha Mayer, MD;  [facility];  Service: Endoscopy;  Laterality: N/A;  . CORONARY ARTERY BYPASS GRAFT  1998   x2; LIMA to LAD; SVG to diagonal off bypass    . HEMORRHOID SURGERY  08/26/2012   Dr. Brantley Stage  . LOOP RECORDER INSERTION N/A 05/13/2018   Procedure: LOOP RECORDER INSERTION;  Surgeon: [name], MD;  Location: [facility];  Service: Cardiovascular;  Laterality: N/A;  . LOOP RECORDER REMOVAL N/A 05/26/2019   Procedure: LOOP RECORDER REMOVAL;  Surgeon: [name], MD;  Location: [facility];  Service: Cardiovascular;  Laterality: N/A;  . MASS EXCISION Left 11/23/2015   Procedure: LEFT WRIST EXCISION CYST;  Surgeon: Leanora Cover, MD;  Location: [facility];  Service: Orthopedics;  Laterality: Left;  . PACEMAKER IMPLANT N/A 05/26/2019   Procedure: PACEMAKER IMPLANT;  Surgeon: [name], MD;  Location: [facility];  Service: Cardiovascular;  Laterality: N/A;  . SKIN BIOPSY  01/29/14   (R) neck; (R) scalp, 2 (L) neck; shave biopsy Superficial basal cell carcinoma Dr. Danella Sensing  . TONSILLECTOMY  1940's    Allergies  Allergen Reactions  . Iodinated Diagnostic Agents Nausea Only and Other (See Comments)    Severe nausea and also passed out  . Bactrim Rash  . Relafen [Nabumetone] Rash  . Sulfanilamide Rash    Outpatient Encounter Medications as of 01/28/2020  Medication Sig  . acetaminophen (TYLENOL) 500 MG tablet Take 500-1,000 mg by mouth every 6 (six) hours as needed (pain.).  Marland Kitchen apixaban (ELIQUIS) 2.5 MG TABS tablet Take 1 tablet (2.5 mg total) by mouth 2 (two) times daily.  Marland Kitchen atenolol (TENORMIN) 25 MG tablet TAKE 1 TABLET DAILY  . calcium-vitamin D (OSCAL WITH D) 500-200 MG-UNIT tablet Take 1 tablet by mouth daily.  . Cholecalciferol (VITAMIN D3) 50 MCG (2000 UT) TABS Take 2,000 Units by mouth daily.  . digoxin (LANOXIN) 0.125 MG tablet Take 1 tablet (125 mcg total) by mouth every other day.  Marland Kitchen LORazepam (ATIVAN) 1 MG tablet TAKE 1 TABLET AT BEDTIME TO DECREASE ANXIETY AND HELP WITH SLEEP  . Oyster Shell (OYSTER CALCIUM) 500 MG TABS tablet Take 500 mg of elemental calcium by mouth 2  (two) times daily.  . [DISCONTINUED] hydrochlorothiazide (HYDRODIURIL) 25 MG tablet TAKE 1 TABLET ONCE DAILY   No facility-administered encounter medications on file as of 01/28/2020.    Review of Systems:  Review of Systems  Constitutional: Negative for chills, fever and malaise/fatigue.  HENT: Positive for hearing loss. Negative for congestion.        Dry mouth; hearing aids  Eyes: Negative for blurred vision.       Glasses  Respiratory: Negative for cough and shortness of breath.   Cardiovascular: Positive for leg swelling. Negative for chest pain and palpitations.       Varicose veins; doing well after pacemaker placement  Gastrointestinal: Negative for abdominal pain and constipation.  Genitourinary: Negative for dysuria.  Musculoskeletal: Negative for falls and joint pain.  Skin: Negative for rash.       Dry skin, callous of right little toe, fungal nail right great toe and second toe  Neurological: Negative for  dizziness and loss of consciousness.  Endo/Heme/Allergies: Does not bruise/bleed easily.  Psychiatric/Behavioral: Negative for memory loss. The patient is nervous/anxious (takes lorazepam 1/2 pill nightly since long before I met her). The patient does not have insomnia.     Health Maintenance  Topic Date Due  . URINE MICROALBUMIN  Never done  . TETANUS/TDAP  10/06/2017  . INFLUENZA VACCINE  Completed  . DEXA SCAN  Completed  . PNA vac Low Risk Adult  Completed    Physical Exam: Vitals:   01/28/20 1530  BP: 138/82  Pulse: 92  Temp: 97.7 F (36.5 C)  TempSrc: Temporal  SpO2: 98%  Weight: 138 lb 9.6 oz (62.9 kg)  Height: 5' 6" (1.676 m)   Body mass index is 22.37 kg/m. Physical Exam Vitals reviewed.  Constitutional:      Appearance: Normal appearance.  HENT:     Head: Normocephalic and atraumatic.     Ears:     Comments: Hearing aids Eyes:     Extraocular Movements: Extraocular movements intact.     Pupils: Pupils are equal, round, and reactive to  light.     Comments: glasses  Cardiovascular:     Rate and Rhythm: Rhythm irregular.     Heart sounds: No murmur.     Comments: Varicose veins and some mild hyperpigmentation of lower legs and nonpitting edema (very mild) in feet Pulmonary:     Effort: Pulmonary effort is normal.     Breath sounds: Normal breath sounds. No wheezing, rhonchi or rales.  Abdominal:     General: Bowel sounds are normal.  Musculoskeletal:        General: Normal range of motion.     Cervical back: Neck supple.  Skin:    General: Skin is warm and dry.     Capillary Refill: Capillary refill takes less than 2 seconds.     Comments: Dry skin, callous of right little toe, fungal nail right great toe and second toe   Neurological:     General: No focal deficit present.     Mental Status: She is alert and oriented to person, place, and time.     Cranial Nerves: No cranial nerve deficit.     Motor: No weakness.     Gait: Gait normal.  Psychiatric:        Mood and Affect: Mood normal.        Behavior: Behavior normal.        Thought Content: Thought content normal.        Judgment: Judgment normal.     Labs reviewed: Basic Metabolic Panel: Recent Labs    03/25/19 0800 05/26/19 0812  NA 141 135  K 3.4 3.2*  CL  --  99  CO2  --  26  GLUCOSE  --  103*  BUN 26* 24*  CREATININE 1.1 0.97  CALCIUM  --  9.4   Liver Function Tests: Recent Labs    03/25/19 0800  AST 20  ALT 10  ALKPHOS 68   No results for input(s): LIPASE, AMYLASE in the last 8760 hours. No results for input(s): AMMONIA in the last 8760 hours. CBC: Recent Labs    03/25/19 0000 05/26/19 0812 07/15/19 0000  WBC 4.6 5.1 5.0  NEUTROABS  --   --  2  HGB 13.1 11.9* 12.1  HCT 39 36.7 37  MCV  --  89.3  --   PLT 106* 108* 108*   Lipid Panel: Recent Labs    03/25/19 0800  CHOL 170  HDL 53  LDLCALC 106  TRIG 54   Lab Results  Component Value Date   HGBA1C 6.0 03/25/2019    Assessment/Plan 1. Permanent atrial  fibrillation (HCC) -now rate controlled with pacemaker in place (had complete heart block), remains on digoxin and atenolol  2. Hypercoagulable state due to atrial fibrillation (HCC) -stable with eliqius  3. Coronary artery disease involving native coronary artery of native heart without angina pectoris -prior MI and CABG  -at 22, not on statin, follows healthy diet and stays very active  4. Presence of cardiac pacemaker -placed for complete heart block, followed closely by cardiology  5. Xerosis cutis -suggested cerave cream or okeefe's working hands  6. Onychomycosis of left great toe -advised to get debrided by Dr. Jacqualyn Posey  7. Asymptomatic varicose veins of both lower extremities -noted, monitor edema and if becomes significant, use compression hose  8. Age-related osteoporosis without current pathological fracture -advised to take calcium with D supplement bid and not to take oyster calcium also -did not want to take additional D3, will try to get 15-10 mins of sunshine instead -has not wanted OP meds  Labs/tests ordered:  Cbc, bmp, flp  Next appt:  6 mos  Tiffany L. Reed, D.O. [facility] [address], [place] [address] Cell Phone (Mon-Fri 8am-5pm):  [phone] On Call:  [phone] & follow prompts after 5pm & weekends Office Phone:  [phone] Office Fax:  [phone]

## 2020-01-28 NOTE — Patient Instructions (Signed)
Appears your purple feet are due to venous disease.   I agree with going to get your left great toenail debrided with podiatry. For your dry skin, try cerave cream OR o'keefe's working hands in Agricultural [employer]. For the dry mouth, this is due in part to age, sleeping with your mouth open, dry air, and lorazepam.  I recommend a humidifier, the biotene, lemon lozenges, and hydration. Stop the oyster calcium Continue oscal with D which should be twice a day and get your 15-20 mins of sunshine daily.   [facility]

## 2020-01-29 DIAGNOSIS — I8393 Asymptomatic varicose veins of bilateral lower extremities: Secondary | ICD-10-CM | POA: Insufficient documentation

## 2020-01-29 DIAGNOSIS — B351 Tinea unguium: Secondary | ICD-10-CM | POA: Insufficient documentation

## 2020-01-29 DIAGNOSIS — L853 Xerosis cutis: Secondary | ICD-10-CM | POA: Insufficient documentation

## 2020-02-25 ENCOUNTER — Telehealth: Payer: Self-pay | Admitting: *Deleted

## 2020-02-25 ENCOUNTER — Non-Acute Institutional Stay: Payer: Medicare Other | Admitting: Internal Medicine

## 2020-02-25 ENCOUNTER — Ambulatory Visit
Admission: RE | Admit: 2020-02-25 | Discharge: 2020-02-25 | Disposition: A | Discharge status: Home / Self Care | Payer: Medicare Other | Source: Ambulatory Visit | Attending: Internal Medicine | Admitting: Internal Medicine

## 2020-02-25 ENCOUNTER — Encounter: Payer: Self-pay | Admitting: Internal Medicine

## 2020-02-25 ENCOUNTER — Other Ambulatory Visit: Payer: Self-pay

## 2020-02-25 VITALS — BP 122/78 | HR 80 | Temp 97.3°F | Wt 136.0 lb

## 2020-02-25 DIAGNOSIS — M7989 Other specified soft tissue disorders: Secondary | ICD-10-CM

## 2020-02-25 DIAGNOSIS — I2510 Atherosclerotic heart disease of native coronary artery without angina pectoris: Secondary | ICD-10-CM

## 2020-02-25 DIAGNOSIS — M79662 Pain in left lower leg: Secondary | ICD-10-CM

## 2020-02-25 DIAGNOSIS — M25462 Effusion, left knee: Secondary | ICD-10-CM | POA: Diagnosis not present

## 2020-02-25 IMAGING — CT CT KNEE*L* W/O CM
1 series · 12 of 14 positions shown, 15 images · non-contrast
Comparison: X-ray [DATE]

CLINICAL DATA: Acute on chronic left knee pain and swelling

EXAM:
CT OF THE LEFT KNEE WITHOUT CONTRAST
TECHNIQUE: Multidetector CT imaging of the left knee was performed according to
the standard protocol. Multiplanar CT image reconstructions were
also generated.

[Series 3: knee soft tissue · axial · 0.38mm/px · z∈[+256,+406]mm · 12 of 60 slices shown, 15 images]
[im 5/60  soft-tissue]
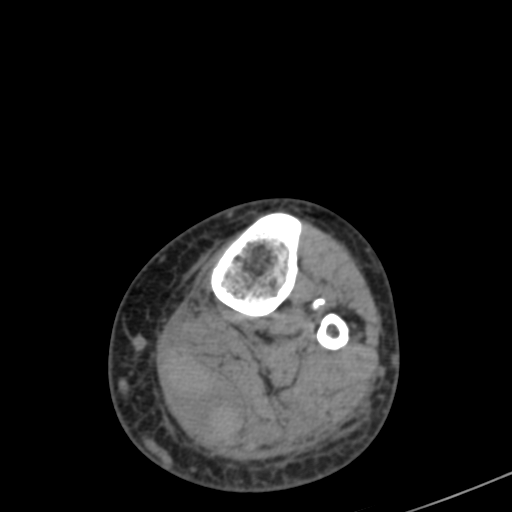
[im 5/60  bone]
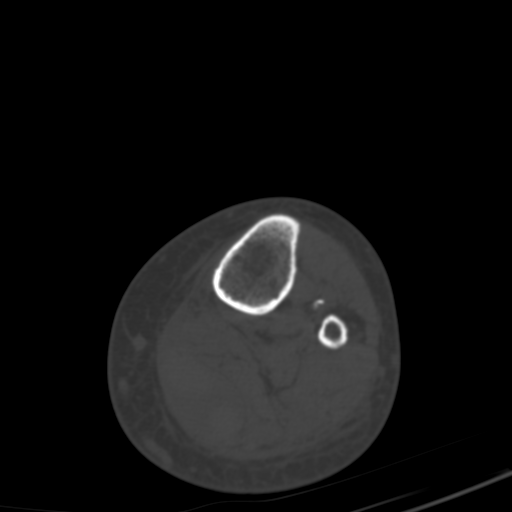
[im 10/60  bone]
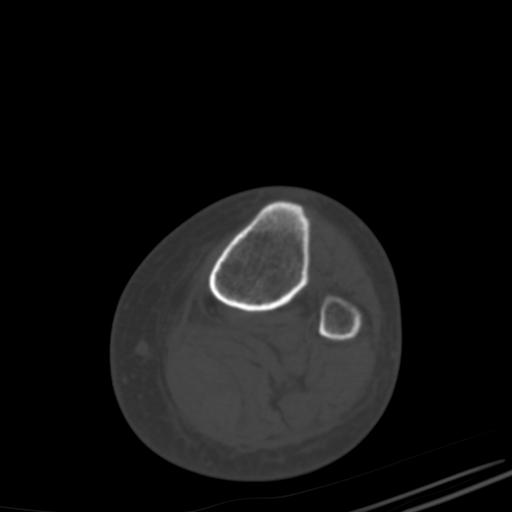
[im 14/60  bone]
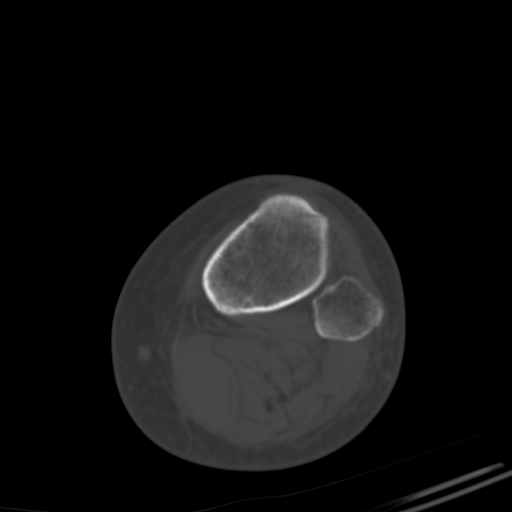
[im 19/60  bone]
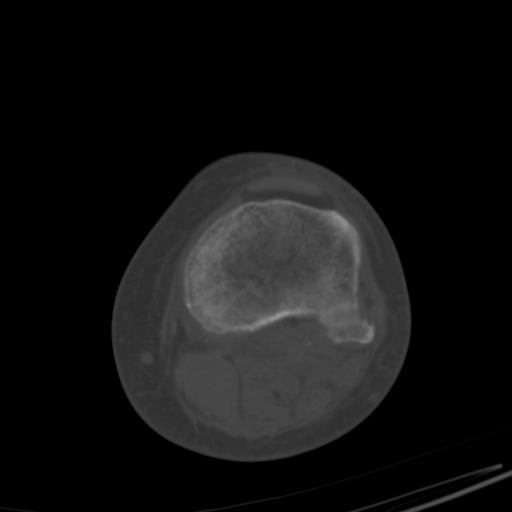
[im 23/60  soft-tissue]
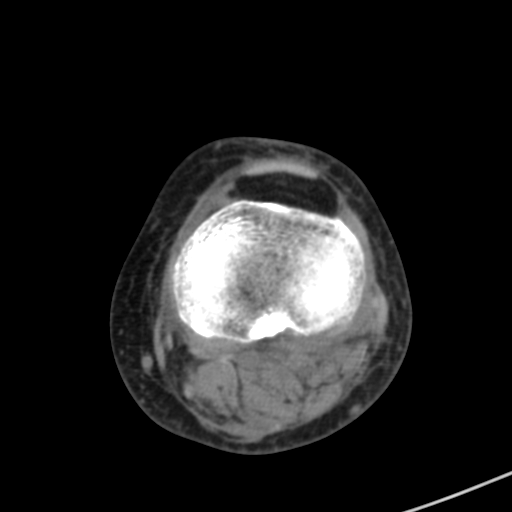
[im 23/60  bone]
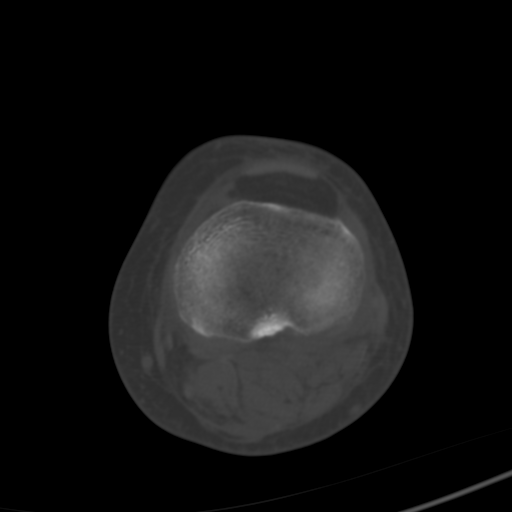
[im 28/60  bone]
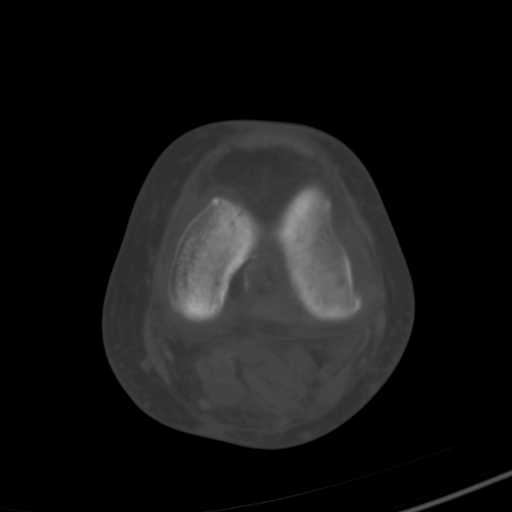
[im 32/60  bone]
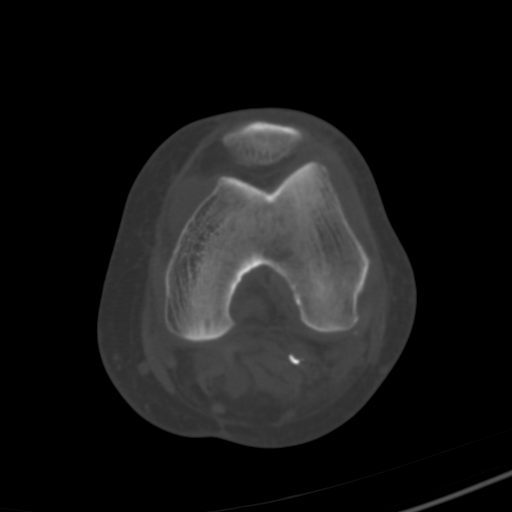
[im 37/60  bone]
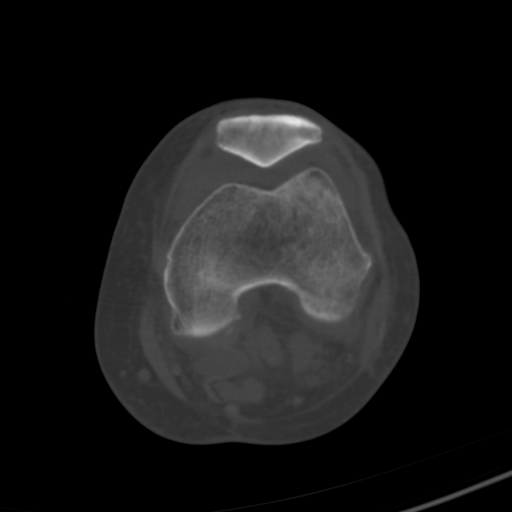
[im 41/60  soft-tissue]
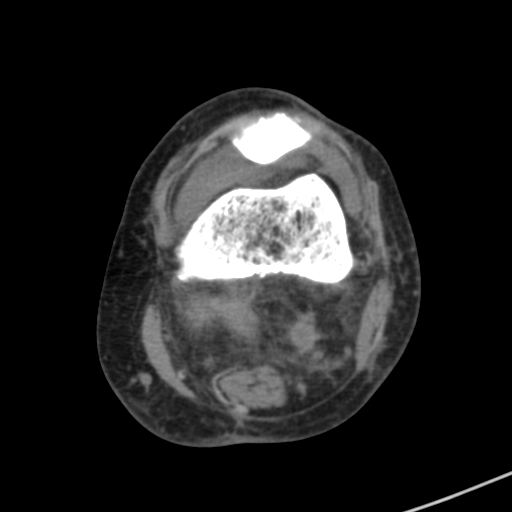
[im 41/60  bone]
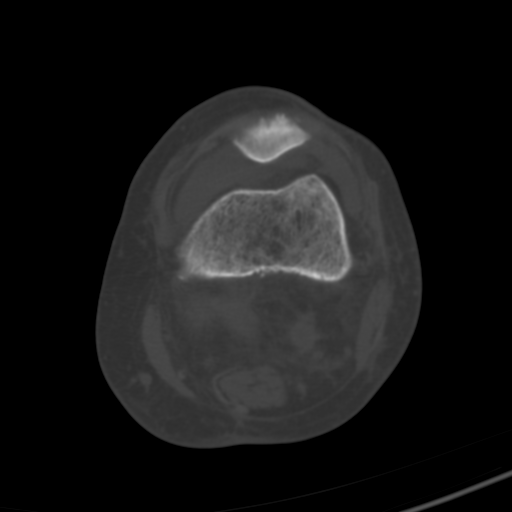
[im 46/60  bone]
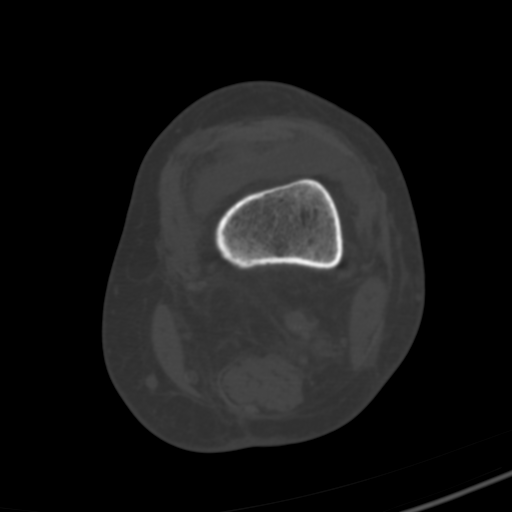
[im 50/60  bone]
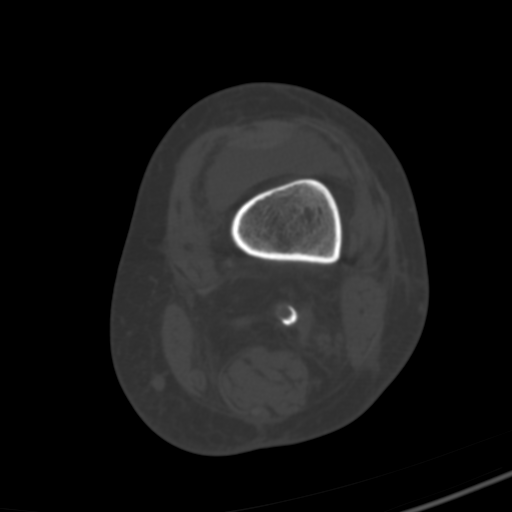
[im 55/60  bone]
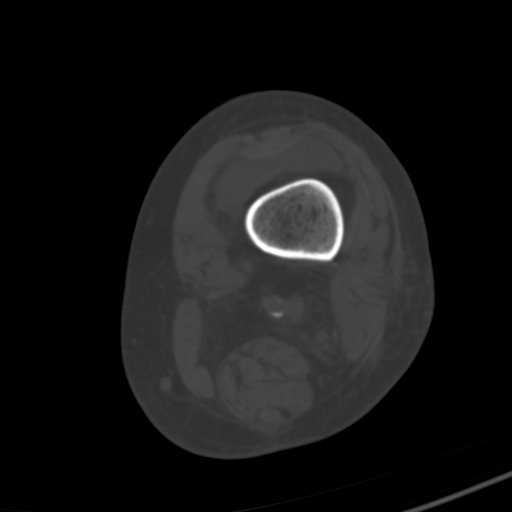

[12 of 14 positions shown; findings below may reference images not displayed]

FINDINGS: Bones/Joint/Cartilage

No acute fracture. No dislocation. Mild medial and lateral
compartment joint space narrowing. Chondrocalcinosis of the menisci.
Moderate-sized hyperdense knee joint effusion without fat-fluid
level. Large heterogeneously hyperdense Baker's cyst measuring 3.7 x
2.3 cm trans-axially by at least 8.3 cm craniocaudally. The inferior
extension of the collection is not entirely included within the
field of view.

Ligaments

Suboptimally assessed by CT. ACL and PCL appear grossly intact.

Muscles and Tendons

No musculotendinous abnormality identified.

Soft tissues

Vascular calcifications. Soft tissues otherwise within normal
limits.
IMPRESSION: 1. Moderate-sized hyperdense knee joint effusion suggestive of a
hemarthrosis. Etiology of which is not identified on this study.
Correlate with serum coagulation studies.
2. Large heterogeneously hyperdense Baker's cyst measuring at least
3.7 x 2.3 x 8.3 cm, also likely representing blood products.
3. No acute fracture.
4. Mild medial and lateral compartment joint space narrowing and
chondrocalcinosis of the menisci.

## 2020-02-25 NOTE — Telephone Encounter (Signed)
Thank you.  I have sent a result note to Charlene to call patient about this.

## 2020-02-25 NOTE — Progress Notes (Signed)
So, Susan Conway does have a baker's cyst and there is a bloody effusion around her knee.  I recommend she have it drained and injected by interventional radiology.  I can put in the referral for this.

## 2020-02-25 NOTE — Telephone Encounter (Signed)
[facility], Opal Sidles,  called with Call Report for patient's CT Left Knee:  IMPRESSION: 1. Moderate-sized hyperdense knee joint effusion suggestive of a hemarthrosis. Etiology of which is not identified on this study. Correlate with serum coagulation studies. 2. Large heterogeneously hyperdense Baker's cyst measuring at least 3.7 x 2.3 x 8.3 cm, also likely representing blood products. 3. No acute fracture. 4. Mild medial and lateral compartment joint space narrowing and chondrocalcinosis of the menisci.    Results have resulted in patient's chart.

## 2020-02-25 NOTE — Progress Notes (Signed)
Location:  Occupational psychologist of Service:  Clinic (12)  Provider: Kinnick Maus L. Mariea Clonts, D.O., C.M.D.  Code Status: DNR Goals of Care:  Advanced Directives 02/25/2020  Does Patient Have a Medical Advance Directive? Yes  Type of Paramedic of [place];Living will  Does patient want to make changes to medical advance directive? No - Patient declined  Copy of [place] in Chart? Yes - validated most recent copy scanned in chart (See row information)  Pre-existing out of facility DNR order (yellow form or pink MOST form) -     Chief Complaint  Patient presents with  . Acute Visit    left knee pain     HPI: Patient is a 86 y.o. female seen today for acute left knee pain and swelling.  Started last week.  Back of leg, knee, calf is swollen/hard/sore.  Even swelling into ankle.  It feels sort of like her knee is not aligned properly.  It's interfered with her getting in and out of a car and going to get her meals.  She normally is active and walks to get her meals, gardens and does chores around her home. Normally takes no more than 4 tylenol per day.  May go up to 6 now.  Past Medical History:  Diagnosis Date  . Actinic keratosis 05/25/2014  . Anxiety   . Atrial fibrillation ([place]) 09/21/2010  . Basal cell carcinoma 05/25/2014   Multiple removed by Dr. Danella Sensing in March 2015: right neck, left neck, scalp   . Bell's palsy 07/18/1979  . Cervicalgia 01/31/2012  . Closed fracture of lumbar vertebra without mention of spinal cord injury 07/17/2005  . Conjunctiva disorder 12/26/2010  . Coronary atherosclerosis of native coronary artery 07/17/1997  . Cramp of limb 08/16/2009  . Degeneration of lumbar or lumbosacral intervertebral disc 01/31/2012  . Disturbance of skin sensation 08/2009  . Dizziness and giddiness 11/08/2011   vertigo  . External hemorrhoids without mention of complication 123XX123  . External hemorrhoids without  mention of complication [id number]  . Ganglion of tendon sheath 08/16/2009  . Leg cramp 10/27/2013   Most frequently the right leg.   . Long term (current) use of anticoagulants 09/2010  . Lumbago 07/2009  . Meralgia paresthetica 07/2007  . MI, old   . Myalgia and myositis, unspecified 01/17/2012  . Osteoporosis   . Other abnormal blood chemistry 04/08/2012  . Other abnormal blood chemistry 2013   hyperglycemia  . Other disorder of muscle, ligament, and fascia 04/08/2012  . Other specified cardiac dysrhythmias(427.89) 06/27/2010  . Pain in joint, ankle and foot 10/27/2013   Bilateral since 1998   . Pain in joint, shoulder region 08/12/2012  . Pain in joint, upper arm 12/26/2010  . Pain in limb 01/11/2011  . Pain in thoracic spine 01/31/2012  . 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 PVC's (premature ventricular contractions)   . Rash and other nonspecific skin eruption 08/02/2011  . Senile osteoporosis 07/17/1993  . Unspecified essential hypertension 07/17/1997  . Varicose veins of lower extremities 08/16/2009  . Varicose veins of lower extremities 08/16/2009    Past Surgical History:  Procedure Laterality Date  . COLONOSCOPY WITH PROPOFOL N/A 10/19/2015   Procedure: COLONOSCOPY WITH PROPOFOL;  Surgeon: Gatha Mayer, MD;  Location: [facility];  Service: Endoscopy;  Laterality: N/A;  . CORONARY ARTERY BYPASS GRAFT  1998   x2; LIMA to LAD; SVG to diagonal off bypass  . HEMORRHOID SURGERY  08/26/2012   Dr. Brantley Stage  . LOOP RECORDER INSERTION N/A 05/13/2018   Procedure: LOOP RECORDER INSERTION;  Surgeon: [name], MD;  Location: [facility];  Service: Cardiovascular;  Laterality: N/A;  . LOOP RECORDER REMOVAL N/A 05/26/2019   Procedure: LOOP RECORDER REMOVAL;  Surgeon: [name], MD;  Location: [facility];  Service: Cardiovascular;  Laterality: N/A;  . MASS EXCISION Left 11/23/2015   Procedure: LEFT WRIST EXCISION CYST;  Surgeon: Leanora Cover, MD;  Location:  [facility];  Service: Orthopedics;  Laterality: Left;  . PACEMAKER IMPLANT N/A 05/26/2019   Procedure: PACEMAKER IMPLANT;  Surgeon: [name], MD;  Location: [facility];  Service: Cardiovascular;  Laterality: N/A;  . SKIN BIOPSY  01/29/14   (R) neck; (R) scalp, 2 (L) neck; shave biopsy Superficial basal cell carcinoma Dr. Danella Sensing  . TONSILLECTOMY  1940's    Allergies  Allergen Reactions  . Iodinated Diagnostic Agents Nausea Only and Other (See Comments)    Severe nausea and also passed out  . Bactrim Rash  . Relafen [Nabumetone] Rash  . Sulfanilamide Rash    Outpatient Encounter Medications as of 02/25/2020  Medication Sig  . acetaminophen (TYLENOL) 500 MG tablet Take 500-1,000 mg by mouth every 6 (six) hours as needed (pain.).  Marland Kitchen apixaban (ELIQUIS) 2.5 MG TABS tablet Take 1 tablet (2.5 mg total) by mouth 2 (two) times daily.  Marland Kitchen atenolol (TENORMIN) 25 MG tablet TAKE 1 TABLET DAILY  . calcium-vitamin D (OSCAL WITH D) 500-200 MG-UNIT tablet Take 1 tablet by mouth daily.  . digoxin (LANOXIN) 0.125 MG tablet Take 1 tablet (125 mcg total) by mouth every other day.  Marland Kitchen LORazepam (ATIVAN) 1 MG tablet TAKE 1 TABLET AT BEDTIME TO DECREASE ANXIETY AND HELP WITH SLEEP   No facility-administered encounter medications on file as of 02/25/2020.    Review of Systems:  Review of Systems  Constitutional: Negative for chills and fever.  HENT: Negative for congestion and sore throat.   Eyes: Negative for blurred vision.  Respiratory: Negative for shortness of breath.   Cardiovascular: Positive for leg swelling. Negative for chest pain and palpitations.  Gastrointestinal: Negative for abdominal pain.  Genitourinary: Negative for dysuria.  Musculoskeletal: Positive for joint pain. Negative for falls (no known injury).  Neurological: Negative for dizziness and loss of consciousness.  Endo/Heme/Allergies: Bruises/bleeds easily.  Psychiatric/Behavioral: Negative for  memory loss. The patient is not nervous/anxious and does not have insomnia.     Health Maintenance  Topic Date Due  . COVID-19 Vaccine (1) Never done  . TETANUS/TDAP  10/06/2017  . INFLUENZA VACCINE  06/06/2020  . DEXA SCAN  Completed  . PNA vac Low Risk Adult  Completed  . URINE MICROALBUMIN  Discontinued    Physical Exam: Vitals:   02/25/20 0857  Weight: 136 lb (61.7 kg)   Body mass index is 21.95 kg/m. Physical Exam Vitals reviewed.  Constitutional:      General: She is not in acute distress.    Appearance: Normal appearance. She is not ill-appearing or toxic-appearing.  HENT:     Head: Normocephalic and atraumatic.  Cardiovascular:     Rate and Rhythm: Normal rate and regular rhythm.     Comments: Has pacemaker Pulmonary:     Effort: Pulmonary effort is normal.     Breath sounds: Normal breath sounds. No wheezing, rhonchi or rales.  Abdominal:     General: Bowel sounds are normal.  Musculoskeletal:  General: Swelling and tenderness present.     Right lower leg: No edema.     Left lower leg: Edema present.     Comments: Left leg from knee distally is swollen, warm, NOT red, is tender mostly in posterior popliteal space and down calf where there is an ecchymotic area visible; dorsalis pedis pulse intact, foot is warm with slightly cooler toes--no tenderness of foot, sensation to light touch intact; anterior knee is not tender  Skin:    General: Skin is warm and dry.     Findings: Bruising present.  Neurological:     General: No focal deficit present.     Mental Status: She is alert and oriented to person, place, and time.  Psychiatric:        Mood and Affect: Mood normal.        Behavior: Behavior normal.        Thought Content: Thought content normal.        Judgment: Judgment normal.     Labs reviewed: Basic Metabolic Panel: Recent Labs    03/25/19 0800 05/26/19 0812  NA 141 135  K 3.4 3.2*  CL  --  99  CO2  --  26  GLUCOSE  --  103*  BUN 26*  24*  CREATININE 1.1 0.97  CALCIUM  --  9.4   Liver Function Tests: Recent Labs    03/25/19 0800  AST 20  ALT 10  ALKPHOS 68   No results for input(s): LIPASE, AMYLASE in the last 8760 hours. No results for input(s): AMMONIA in the last 8760 hours. CBC: Recent Labs    03/25/19 0000 05/26/19 0812 07/15/19 0000  WBC 4.6 5.1 5.0  NEUTROABS  --   --  2  HGB 13.1 11.9* 12.1  HCT 39 36.7 37  MCV  --  89.3  --   PLT 106* 108* 108*   Lipid Panel: Recent Labs    03/25/19 0800  CHOL 170  HDL 53  LDLCALC 106  TRIG 54   Lab Results  Component Value Date   HGBA1C 6.0 03/25/2019     Assessment/Plan 1. Pain and swelling of left lower leg -she had something similar just over a year ago -at that time, I treated her like she had pseudogout, but looking back she'd heard a pop sound when she had turned -we ruled out DVT one year ago -pain had gradually subsided with conservative tx -now she's having recurrence, but this time no known turning of the leg or increased activity, certainly no fall or trauma -she notes the pain began last week and got considerably worse over the weekend to where she could not walk to get her meals or even feel safe to drive (got a ride here and bearing weight appeared quite painful--she was limping using her cane) -advised to use walker for now, rest, elevated apply ice to lower leg -use tylenol alternating with aleve for 3-4 days (instructions given) -CT ordered due to prior similar but less severe symptoms and considerable pain -she does not want to stay in [place] resp[place] temporarily--s[place] be ok in [place] home--WS will bring to appt  - CT KNEE LEFT WO CONTRAST; Future  Labs/tests ordered:  Orders Placed This Encounter  Procedures  . CT KNEE LEFT WO CONTRAST    Medicare/[employer] WALK-IN STAT [phone] /no needs/no to all covid q's/wear mask,come alone,ab spoke w/Lisa@[facility] /epic order    Standing Status:   Future    Number of Occurrences:   1  Standing Expiration Date:   05/26/2021    Order Specific Question:   ** REASON FOR EXAM (FREE TEXT)    Answer:   left leg pain and swelling, bruising posteriorly, r/o baker's cyst    Order Specific Question:   Preferred imaging location?    Answer:   [id number]    Order Specific Question:   Call Results- Best Contact Number?    AnswerBY:[phone] LET PATIENT LEAVE    Order Specific Question:   Radiology Contrast Protocol - do NOT remove file path    Answer:   [email or web address]   Next appt:  07/28/2020 25 mins spent with her reviewing events and providing instructions  Shirely Toren L. Camara Renstrom, D.O. [facility] [address], [place] [address] Cell Phone (Mon-Fri 8am-5pm):  [phone] On Call:  [phone] & follow prompts after 5pm & weekends Office Phone:  [phone] Office Fax:  [phone]

## 2020-02-25 NOTE — Patient Instructions (Addendum)
Alternate acetaminophen and aleve (naproxen) every 6 hours while you're awake for the next 3 days. Rest, ice and elevate the left leg. We will get a CT of your left leg to evaluate for a baker's cyst that has ruptured versus other trauma in the knee itself.   Don't take the aleve for more than 4 days.   Call me back if the aleve and tylenol do not give you relief.

## 2020-02-26 ENCOUNTER — Other Ambulatory Visit: Payer: Self-pay | Admitting: Internal Medicine

## 2020-02-26 ENCOUNTER — Telehealth: Payer: Self-pay | Admitting: *Deleted

## 2020-02-26 ENCOUNTER — Ambulatory Visit (INDEPENDENT_AMBULATORY_CARE_PROVIDER_SITE_OTHER): Payer: Medicare Other | Admitting: *Deleted

## 2020-02-26 ENCOUNTER — Telehealth: Payer: Self-pay

## 2020-02-26 DIAGNOSIS — M7122 Synovial cyst of popliteal space [Baker], left knee: Secondary | ICD-10-CM

## 2020-02-26 DIAGNOSIS — I442 Atrioventricular block, complete: Secondary | ICD-10-CM

## 2020-02-26 DIAGNOSIS — M79662 Pain in left lower leg: Secondary | ICD-10-CM

## 2020-02-26 DIAGNOSIS — M7989 Other specified soft tissue disorders: Secondary | ICD-10-CM

## 2020-02-26 LAB — CUP PACEART REMOTE DEVICE CHECK
Battery Remaining Longevity: 109 mo
Battery Remaining Percentage: 95.5 %
Battery Voltage: 3.02 V
Brady Statistic RV Percent Paced: 1.1 %
Date Time Interrogation Session: 20210422040013
Implantable Lead Implant Date: 20200720
Implantable Lead Location: 753860
Implantable Pulse Generator Implant Date: 20200720
Lead Channel Impedance Value: 560 Ohm
Lead Channel Pacing Threshold Amplitude: 0.75 V
Lead Channel Pacing Threshold Pulse Width: 0.5 ms
Lead Channel Sensing Intrinsic Amplitude: 9.6 mV
Lead Channel Setting Pacing Amplitude: 5 V
Lead Channel Setting Pacing Pulse Width: 0.5 ms
Lead Channel Setting Sensing Sensitivity: 2 mV
Pulse Gen Model: 1272
Pulse Gen Serial Number: 9122450

## 2020-02-26 MED ORDER — TRAMADOL HCL 50 MG PO TABS: 50.0000 mg | ORAL_TABLET | Freq: Three times a day (TID) | ORAL | 0 refills | Status: AC | PRN

## 2020-02-26 NOTE — Telephone Encounter (Signed)
Susan Conway with [facility] called and stated that patient is complaining about pain with walking. Stated that patient can walk with the walker but it is extremely painful. Tylenol is not working to control the pain and they are wondering if there is something else she can take to help with the pain. Please Advise.

## 2020-02-26 NOTE — Telephone Encounter (Signed)
Can you please call Susan Conway back and see if there's any chance she can be seen sooner or be put on a list if there's a cancellation?

## 2020-02-26 NOTE — Telephone Encounter (Signed)
Kayia said she spoke with Susan Conway at the imaging office and the 1st available appointment is 4/29 she would like an earlier appointment because she in so much pain. Please advise

## 2020-02-26 NOTE — Telephone Encounter (Signed)
We are trying to get her baker's cyst aspiration and injection appt moved up sooner than 4/29, but not sure that will be possible.  Yesterday, on her after visit summary, I recommended she alternate between tylenol and aleve for the pain and use ice.  I have just now sent in tramadol for up to three times per day for severe pain.

## 2020-02-26 NOTE — Progress Notes (Signed)
Referral entered urgently and referral coordinator notified.

## 2020-02-26 NOTE — Telephone Encounter (Signed)
Vicky said that if they had a cancellation she will definitely give Susan Conway a call to get her in.

## 2020-02-26 NOTE — Telephone Encounter (Signed)
Susan Conway with [facility] notified and agreed. Stated she will call and explain to patient.

## 2020-02-27 NOTE — Progress Notes (Signed)
PPM Remote  

## 2020-03-02 ENCOUNTER — Encounter: Payer: Self-pay | Admitting: Internal Medicine

## 2020-03-02 ENCOUNTER — Non-Acute Institutional Stay (SKILLED_NURSING_FACILITY): Payer: Medicare Other | Admitting: Internal Medicine

## 2020-03-02 DIAGNOSIS — M7122 Synovial cyst of popliteal space [Baker], left knee: Secondary | ICD-10-CM

## 2020-03-02 DIAGNOSIS — D6869 Other thrombophilia: Secondary | ICD-10-CM

## 2020-03-02 DIAGNOSIS — Z66 Do not resuscitate: Secondary | ICD-10-CM | POA: Diagnosis not present

## 2020-03-02 DIAGNOSIS — I2510 Atherosclerotic heart disease of native coronary artery without angina pectoris: Secondary | ICD-10-CM | POA: Diagnosis not present

## 2020-03-02 DIAGNOSIS — I4891 Unspecified atrial fibrillation: Secondary | ICD-10-CM | POA: Diagnosis not present

## 2020-03-02 DIAGNOSIS — I4821 Permanent atrial fibrillation: Secondary | ICD-10-CM

## 2020-03-02 DIAGNOSIS — Z950 Presence of cardiac pacemaker: Secondary | ICD-10-CM | POA: Diagnosis not present

## 2020-03-02 NOTE — [place] Notes (Signed)
Patient ID: Susan Conway, female   DOB: 07/22/1929, 86 y.o.   MRN: [id number]  Provider:  Rexene Edison. Mariea Clonts, D.O., C.M.D. Location:  [facility] Room Number: [place] of Service:  SNF (31)  PCP: [name], DO Patient Care Team: [name], DO as PCP - General (Geriatric Medicine) [name], MD as Consulting Physician (Dermatology) [name], MD as Consulting Physician (Cardiology) [facility] [name], MD as Consulting Physician (Dermatology)  Extended Emergency Contact Information Primary Emergency Contact: [name] of [place] Phone: [phone] Mobile Phone: [phone] Relation: Daughter Secondary Emergency Contact: [name],[name]  [name] of [place] Phone: [phone] Relation: Son  Code Status: DNR, MOST Goals of Care: Advanced Directive information Advanced Directives 02/25/2020  Does Patient Have a Medical Advance Directive? Yes  Type of Paramedic of [place];Living will  Does patient want to make changes to medical advance directive? No - Patient declined  Copy of [place] in Chart? Yes - validated most recent copy scanned in chart (See row information)  Pre-existing out of facility DNR order (yellow form or pink MOST form) -      Chief Complaint  Patient presents with  . New Admit To SNF    New admit for rup    HPI: Patient is a 86 y.o. female with h/o CAD with old MI s/p CABG, afib, VT, complete AV block s/p pacemaker recently, htn, GERD, OA and osteoporosis seen today for admission to [facility] due to severe pain in her left lower leg due to a Baker's cyst that appears to be partially ruptured.  Mrs. Neeb had a similar pain and had heard a pop last year in the same leg (I'd thought it was meniscal or ligamentous injury) and quickly had resolution of such pain, but this time, her pain has persisted and  was limiting her ability to safely care for herself in her home so she moved over to rehab end of last week until she can get her left baker's cyst aspirated and injected tomorrow morning with interventional radiology.  She had the leg imaged last week which confirmed the cyst's presence.  The appt has been moved up a bit which is fortunate.  She's been quite miserable, especially yesterday.  The left lower leg remains tight and is exquisitely tender if bumped or even lightly touched.  She's been resting in her recliner.  She's been resistant to using the tramadol I prescribed end of last week due to fears of addiction and bad side effects (she's known someone who passed away related to opioids).  She did finally accept tramadol late yesterday afternoon and was able to rest well last night and does feel quite a bit better today.  Otherwise, she was receiving tylenol and cold compresses/ice on the area which helped a little only.  She admits that yesterday she was not herself.  She seems back to her pleasant spunky self today, but is clearly having discomfort during her examination of her left leg.    She reports she's been trying to be very good about fiber intake eating leafy greens and fruits, had some prune juice and is hydrating to prevent constipation.    Past Medical History:  Diagnosis Date  . Actinic keratosis 05/25/2014  . Anxiety   . Atrial fibrillation ([place]) 09/21/2010  . Basal cell carcinoma 05/25/2014   Multiple removed by Dr. Danella Sensing in March 2015: right neck,  left neck, scalp   . Bell's palsy 07/18/1979  . Cervicalgia 01/31/2012  . Closed fracture of lumbar vertebra without mention of spinal cord injury 07/17/2005  . Conjunctiva disorder 12/26/2010  . Coronary atherosclerosis of native coronary artery 07/17/1997  . Cramp of limb 08/16/2009  . Degeneration of lumbar or lumbosacral intervertebral disc 01/31/2012  . Disturbance of skin sensation 08/2009  . Dizziness and giddiness  11/08/2011   vertigo  . External hemorrhoids without mention of complication 123XX123  . External hemorrhoids without mention of complication [id number]  . Ganglion of tendon sheath 08/16/2009  . Leg cramp 10/27/2013   Most frequently the right leg.   . Long term (current) use of anticoagulants 09/2010  . Lumbago 07/2009  . Meralgia paresthetica 07/2007  . MI, old   . Myalgia and myositis, unspecified 01/17/2012  . Osteoporosis   . Other abnormal blood chemistry 04/08/2012  . Other abnormal blood chemistry 2013   hyperglycemia  . Other disorder of muscle, ligament, and fascia 04/08/2012  . Other specified cardiac dysrhythmias(427.89) 06/27/2010  . Pain in joint, ankle and foot 10/27/2013   Bilateral since 1998   . Pain in joint, shoulder region 08/12/2012  . Pain in joint, upper arm 12/26/2010  . Pain in limb 01/11/2011  . Pain in thoracic spine 01/31/2012  . 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 PVC's (premature ventricular contractions)   . Rash and other nonspecific skin eruption 08/02/2011  . Senile osteoporosis 07/17/1993  . Unspecified essential hypertension 07/17/1997  . Varicose veins of lower extremities 08/16/2009  . Varicose veins of lower extremities 08/16/2009   Past Surgical History:  Procedure Laterality Date  . COLONOSCOPY WITH PROPOFOL N/A 10/19/2015   Procedure: COLONOSCOPY WITH PROPOFOL;  Surgeon: Gatha Mayer, MD;  Location: [facility];  Service: Endoscopy;  Laterality: N/A;  . CORONARY ARTERY BYPASS GRAFT  1998   x2; LIMA to LAD; SVG to diagonal off bypass  . HEMORRHOID SURGERY  08/26/2012   Dr. Brantley Stage  . LOOP RECORDER INSERTION N/A 05/13/2018   Procedure: LOOP RECORDER INSERTION;  Surgeon: [name], MD;  Location: [facility];  Service: Cardiovascular;  Laterality: N/A;  . LOOP RECORDER REMOVAL N/A 05/26/2019   Procedure: LOOP RECORDER REMOVAL;  Surgeon: [name], MD;  Location: [facility];  Service: Cardiovascular;  Laterality:  N/A;  . MASS EXCISION Left 11/23/2015   Procedure: LEFT WRIST EXCISION CYST;  Surgeon: Leanora Cover, MD;  Location: [facility];  Service: Orthopedics;  Laterality: Left;  . PACEMAKER IMPLANT N/A 05/26/2019   Procedure: PACEMAKER IMPLANT;  Surgeon: [name], MD;  Location: [facility];  Service: Cardiovascular;  Laterality: N/A;  . SKIN BIOPSY  01/29/14   (R) neck; (R) scalp, 2 (L) neck; shave biopsy Superficial basal cell carcinoma Dr. Danella Sensing  . TONSILLECTOMY  1940's    Social History   Socioeconomic History  . Marital status: Widowed    Spouse name: Not on file  . Number of children: Not on file  . Years of education: Not on file  . Highest education level: Not on file  Occupational History  . Occupation: Retired Pharmacist, hospital  Tobacco Use  . Smoking status: Never Smoker  . Smokeless tobacco: Never Used  Substance and Sexual Activity  . Alcohol use: Yes    Comment: occasional wine  . Drug use: No  . Sexual activity: Never  Other Topics Concern  . Not on file  Social History Narrative  [facility] at [facility] since [facility]   Widowed 09/03/2016   Never smoked   Alcohol occasional wine   Exercise, house work    DNR , POA, Living Will   Social Determinants of Health   Financial Resource Strain:   . Difficulty of Paying Living Expenses:   Food Insecurity:   . Worried About Charity fundraiser in the Last Year:   . Arboriculturist in the Last Year:   Transportation Needs:   . Film/video editor (Medical):   Marland Kitchen Lack of Transportation (Non-Medical):   Physical Activity:   . Days of Exercise per Week:   . Minutes of Exercise per Session:   Stress:   . Feeling of Stress :   Social Connections:   . Frequency of Communication with Friends and Family:   . Frequency of Social Gatherings with Friends and Family:   . Attends Religious Services:   . Active Member of Clubs or Organizations:   . Attends Archivist Meetings:   Marland Kitchen Marital Status:      reports that she has never smoked. She has never used smokeless tobacco. She reports current alcohol use. She reports that she does not use drugs.  Functional Status Survey:    Family History  Problem Relation Age of Onset  . Heart attack Father 16  . Heart disease Father   . Hypertension Sister   . Heart disease Sister   . Heart disease Brother     Health Maintenance  Topic Date Due  . COVID-19 Vaccine (1) Never done  . TETANUS/TDAP  10/06/2017  . INFLUENZA VACCINE  06/06/2020  . DEXA SCAN  Completed  . PNA vac Low Risk Adult  Completed  . URINE MICROALBUMIN  Discontinued    Allergies  Allergen Reactions  . Iodinated Diagnostic Agents Nausea Only and Other (See Comments)    Severe nausea and also passed out  . Bactrim Rash  . Relafen [Nabumetone] Rash  . Sulfanilamide Rash    Outpatient Encounter Medications as of 03/02/2020  Medication Sig  . acetaminophen (TYLENOL) 500 MG tablet Take 500-1,000 mg by mouth every 6 (six) hours as needed (pain.).  Marland Kitchen apixaban (ELIQUIS) 2.5 MG TABS tablet Take 1 tablet (2.5 mg total) by mouth 2 (two) times daily.  Marland Kitchen atenolol (TENORMIN) 25 MG tablet TAKE 1 TABLET DAILY  . calcium-vitamin D (OSCAL WITH D) 500-200 MG-UNIT tablet Take 1 tablet by mouth daily.  . digoxin (LANOXIN) 0.125 MG tablet Take 1 tablet (125 mcg total) by mouth every other day.  Marland Kitchen LORazepam (ATIVAN) 1 MG tablet TAKE 1 TABLET AT BEDTIME TO DECREASE ANXIETY AND HELP WITH SLEEP  . traMADol (ULTRAM) 50 MG tablet Take 1 tablet (50 mg total) by mouth every 8 (eight) hours as needed for up to 5 days for severe pain.   No facility-administered encounter medications on file as of 03/02/2020.    Review of Systems  Constitutional: Negative for chills, fever and malaise/fatigue.  HENT: Positive for hearing loss. Negative for sore throat.        Hearing aids  Eyes: Negative for blurred vision.       Glasses  Respiratory: Negative for cough and shortness of breath.     Cardiovascular: Positive for leg swelling. Negative for chest pain, palpitations, orthopnea and PND.  Gastrointestinal: Positive for constipation. Negative for abdominal pain, blood in stool, diarrhea, heartburn and melena.  Genitourinary: Negative for dysuria.  Musculoskeletal: Positive for joint pain. Negative for back pain and  falls.  Skin: Negative for itching and rash.  Neurological: Negative for dizziness, tingling, sensory change and loss of consciousness.  Endo/Heme/Allergies: Bruises/bleeds easily.  Psychiatric/Behavioral: Negative for depression and memory loss. The patient is nervous/anxious. The patient does not have insomnia.        Scored 30/30 on mmse and passed clock drawing    There were no vitals filed for this visit. There is no height or weight on file to calculate BMI. Physical Exam Vitals reviewed.  Constitutional:      General: She is not in acute distress.    Appearance: Normal appearance. She is not ill-appearing or toxic-appearing.  HENT:     Head: Normocephalic and atraumatic.     Right Ear: External ear normal.     Left Ear: External ear normal.     Ears:     Comments: Hearing aids    Nose: Nose normal.     Mouth/Throat:     Pharynx: Oropharynx is clear.  Eyes:     Extraocular Movements: Extraocular movements intact.     Conjunctiva/sclera: Conjunctivae normal.     Pupils: Pupils are equal, round, and reactive to light.     Comments: glasses  Cardiovascular:     Rate and Rhythm: Rhythm irregular.     Heart sounds: No murmur.  Pulmonary:     Effort: Pulmonary effort is normal.     Breath sounds: Normal breath sounds. No rales.  Abdominal:     General: Bowel sounds are normal. There is no distension.     Palpations: Abdomen is soft.     Tenderness: There is no abdominal tenderness. There is no guarding or rebound.  Musculoskeletal:        General: Swelling and tenderness present. No deformity or signs of injury.     Cervical back: No rigidity.      Left lower leg: Edema present.     Comments: Left lower leg up through knee swollen, nonpitting, but tight edema, reddish-purple discoloration down anterior leg and dark purple down posterolateral aspect; still has tense swelling on posterior popliteal space; exquisite tenderness back of leg and difficulty putting weight on leg--limping  Skin:    General: Skin is warm and dry.     Comments: See MSK above  Neurological:     General: No focal deficit present.     Mental Status: She is alert and oriented to person, place, and time.     Cranial Nerves: No cranial nerve deficit.     Motor: No weakness.     Gait: Gait abnormal.     Comments: Gait abnormal due to pain--using walker (normally does not use assistive device)  Psychiatric:        Mood and Affect: Mood normal.        Behavior: Behavior normal.        Thought Content: Thought content normal.        Judgment: Judgment normal.     Labs reviewed: Basic Metabolic Panel: Recent Labs    03/25/19 0800 05/26/19 0812  NA 141 135  K 3.4 3.2*  CL  --  99  CO2  --  26  GLUCOSE  --  103*  BUN 26* 24*  CREATININE 1.1 0.97  CALCIUM  --  9.4   Liver Function Tests: Recent Labs    03/25/19 0800  AST 20  ALT 10  ALKPHOS 68   No results for input(s): LIPASE, AMYLASE in the last 8760 hours. No results for input(s): AMMONIA in  the last 8760 hours. CBC: Recent Labs    03/25/19 0000 05/26/19 0812 07/15/19 0000  WBC 4.6 5.1 5.0  NEUTROABS  --   --  2  HGB 13.1 11.9* 12.1  HCT 39 36.7 37  MCV  --  89.3  --   PLT 106* 108* 108*   Cardiac Enzymes: No results for input(s): CKTOTAL, CKMB, CKMBINDEX, TROPONINI in the last 8760 hours. BNP: Invalid input(s): POCBNP Lab Results  Component Value Date   HGBA1C 6.0 03/25/2019   Lab Results  Component Value Date   TSH 3.80 09/14/2015   No results found for: VITAMINB12 No results found for: FOLATE No results found for: IRON, TIBC, FERRITIN  Imaging and Procedures obtained  prior to SNF admission: CT KNEE LEFT WO CONTRAST  Result Date: 02/25/2020 CLINICAL DATA:  Acute on chronic left knee pain and swelling EXAM: CT OF THE LEFT KNEE WITHOUT CONTRAST TECHNIQUE: Multidetector CT imaging of the left knee was performed according to the standard protocol. Multiplanar CT image reconstructions were also generated. COMPARISON:  X-ray 01/09/2019 FINDINGS: Bones/Joint/Cartilage No acute fracture. No dislocation. Mild medial and lateral compartment joint space narrowing. Chondrocalcinosis of the menisci. Moderate-sized hyperdense knee joint effusion without fat-fluid level. Large heterogeneously hyperdense Baker's cyst measuring 3.7 x 2.3 cm trans-axially by at least 8.3 cm craniocaudally. The inferior extension of the collection is not entirely included within the field of view. Ligaments Suboptimally assessed by CT. ACL and PCL appear grossly intact. Muscles and Tendons No musculotendinous abnormality identified. Soft tissues Vascular calcifications. Soft tissues otherwise within normal limits. IMPRESSION: 1. Moderate-sized hyperdense knee joint effusion suggestive of a hemarthrosis. Etiology of which is not identified on this study. Correlate with serum coagulation studies. 2. Large heterogeneously hyperdense Baker's cyst measuring at least 3.7 x 2.3 x 8.3 cm, also likely representing blood products. 3. No acute fracture. 4. Mild medial and lateral compartment joint space narrowing and chondrocalcinosis of the menisci. Electronically Signed   By: Davina Poke D.O.   On: 02/25/2020 14:54   CUP PACEART REMOTE DEVICE CHECK  Result Date: 02/26/2020 Scheduled remote reviewed. Normal device function.  Known ventricular high threshold and amplitude.  Lead impedance stable. Next remote 91 days. Kathy Breach, RN, CCDS, [employer]   Assessment/Plan 1. Baker's cyst of knee, left -appears partially ruptured based on blood under skin and variable levels of pain since last  week -cont tramadol 50mg  po q8h prn severe pain and tylenol 1-2 q 6 hr prn mild to moderate pain -cont cold compresses/ice, rest, elevation -keep planned aspiration and injection in am -suspect she will be doing much better by Thursday and interested in d/c home  2. Permanent atrial fibrillation (HCC) -cont digoxin, eliquis, pacemaker  3. Hypercoagulable state due to atrial fibrillation ([place]) -continues on eliquis therapy for this and tolerating well--has considerable ecchymoses related to #1  4. Coronary artery disease involving native coronary artery of native heart without angina pectoris -had MI many years ago and CABG, has done well   5. Presence of cardiac pacemaker -just placed July of last year by Dr. Lovena Le  Family/ staff Communication: discussed with SNF nurse  Labs/tests ordered:  No new; await aspiration and injection with IR tomorrow  Kymiah Araiza L. Starlet Gallentine, D.O. [facility] [address], [place] [address] Cell Phone (Mon-Fri 8am-5pm):  [phone] On Call:  [phone] & follow prompts after 5pm & weekends Office Phone:  [phone] Office Fax:  [phone]

## 2020-03-03 ENCOUNTER — Ambulatory Visit
Admission: RE | Admit: 2020-03-03 | Discharge: 2020-03-03 | Disposition: A | Discharge status: Home / Self Care | Payer: Medicare Other | Source: Ambulatory Visit | Attending: Internal Medicine | Admitting: Internal Medicine

## 2020-03-03 ENCOUNTER — Other Ambulatory Visit: Payer: Self-pay | Admitting: Internal Medicine

## 2020-03-03 DIAGNOSIS — S8012XA Contusion of left lower leg, initial encounter: Secondary | ICD-10-CM | POA: Diagnosis not present

## 2020-03-03 DIAGNOSIS — M7122 Synovial cyst of popliteal space [Baker], left knee: Secondary | ICD-10-CM

## 2020-03-03 IMAGING — US US EXTREM LOW*L* LIMITED
1 series · 7 of 7 positions shown · non-contrast
Comparison: CT of the left knee without contrast on [DATE]

CLINICAL DATA: Prior CT demonstrated a probable hemorrhagic Baker's
cyst of the left popliteal fossa. The patient presents for
therapeutic aspiration and injection of the Baker's cyst. She is
anticoagulated with Eliquis and has significant bruising and pain of
the left lower leg.

EXAM:
LEFT LOWER EXTREMITY SOFT TISSUE ULTRASOUND LIMITED
TECHNIQUE: Ultrasound examination was performed of the soft tissues of the left
popliteal fossa and left calf.

[Series 1: us extrem low*left* limited · 0.08mm/px · 7 of 7 slices shown]
[im 1/7]
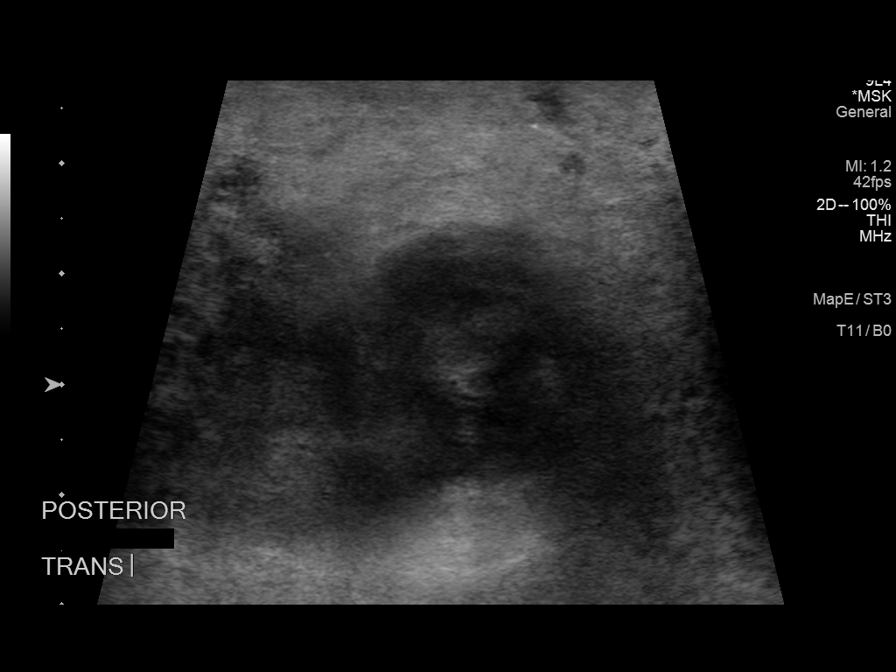
[im 2/7]
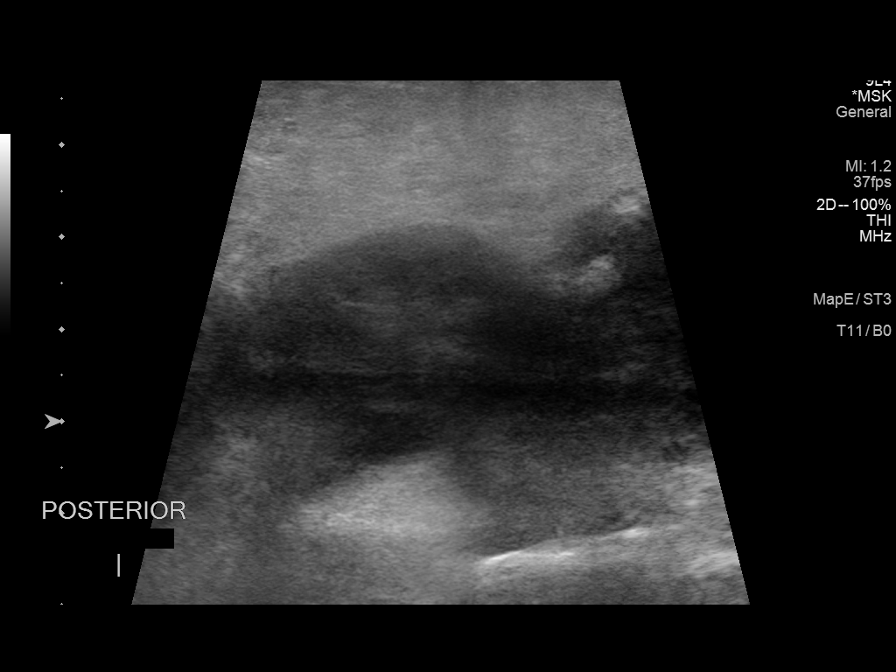
[im 3/7]
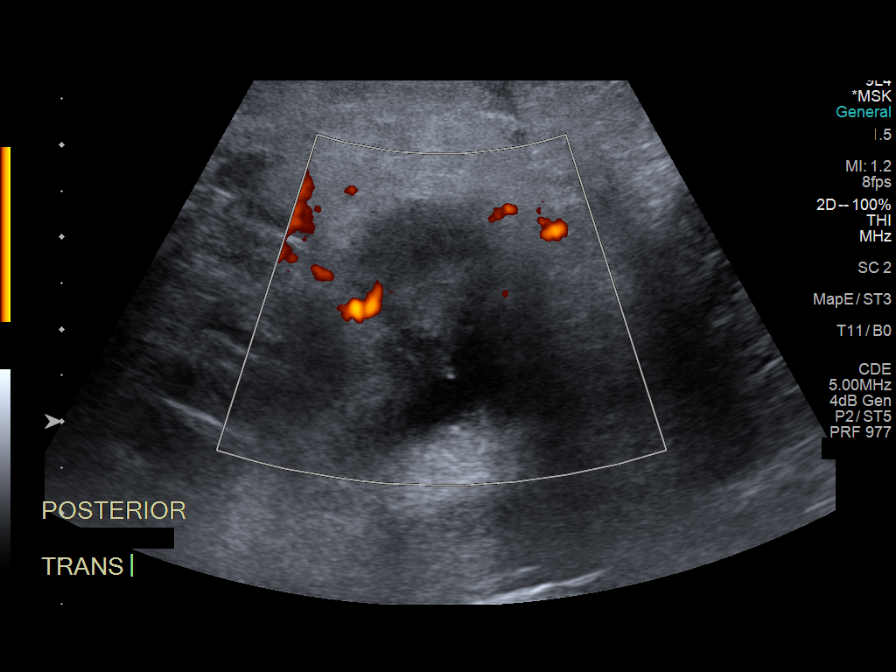
[im 4/7]
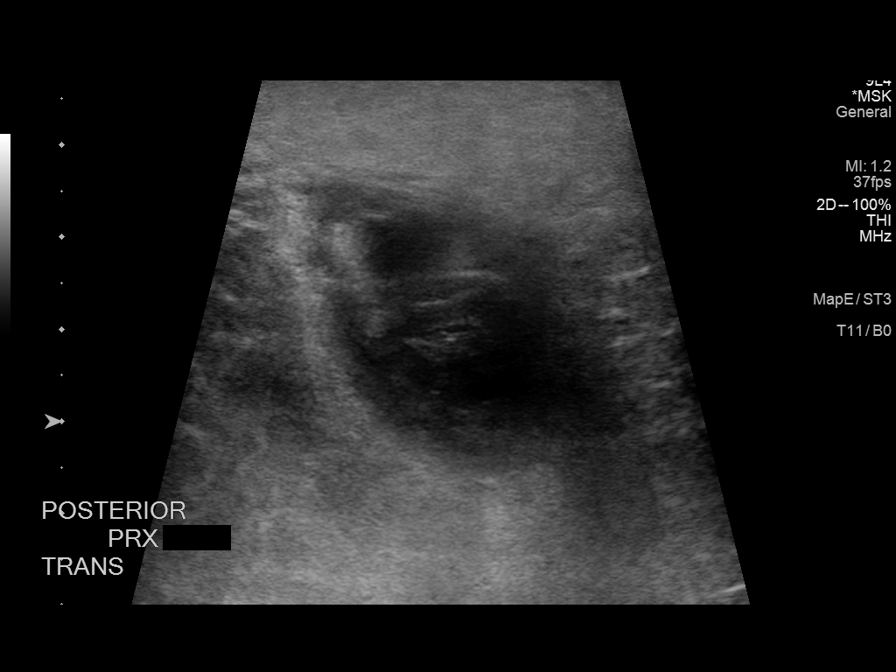
[im 5/7]
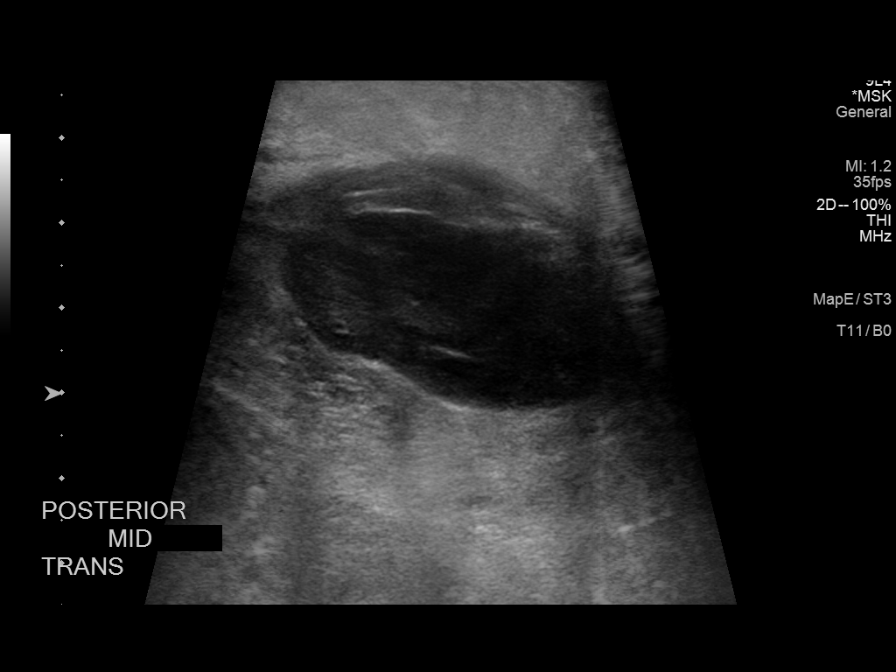
[im 6/7]
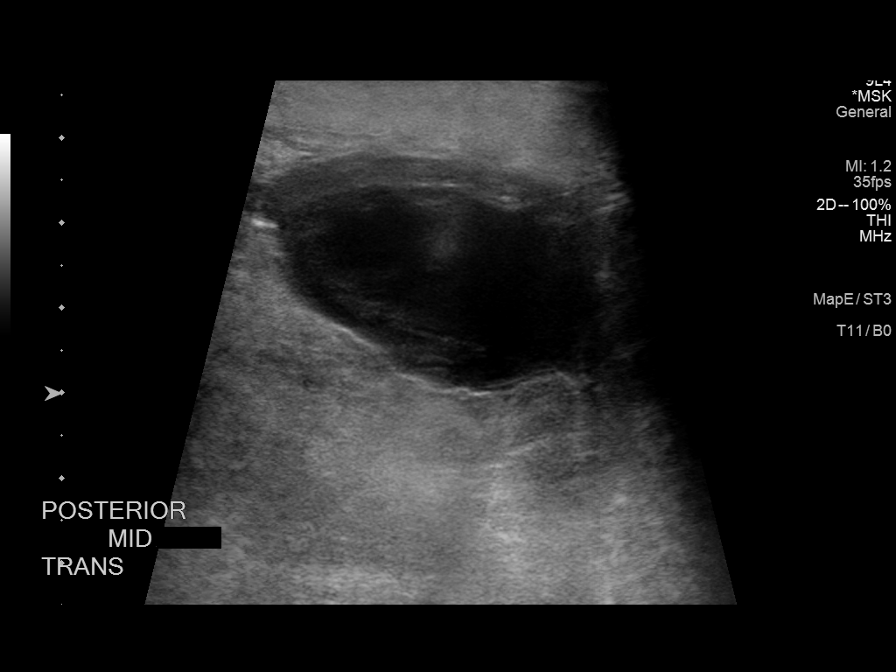
[im 7/7]
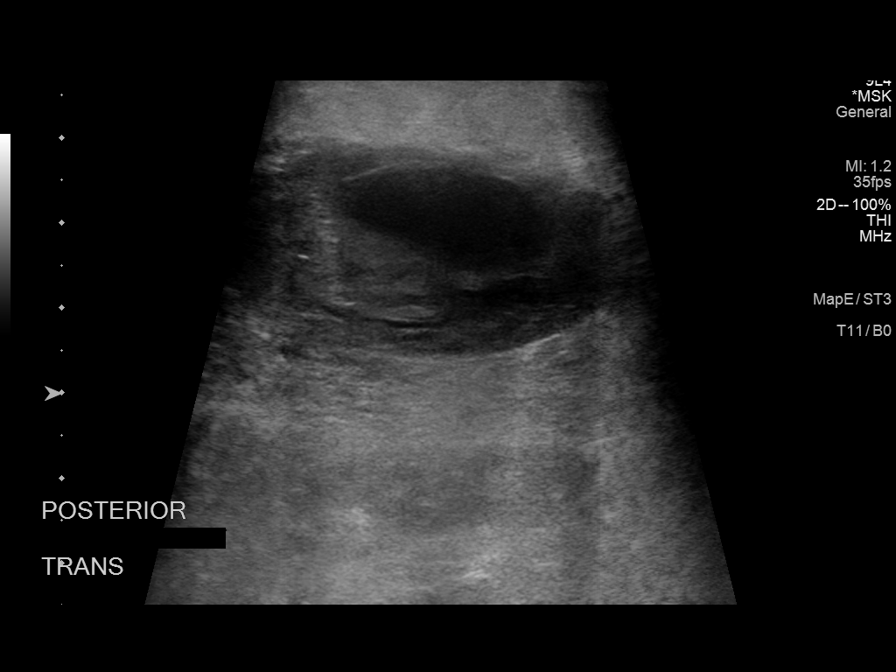

[7 of 7 positions shown; findings below may reference images not displayed]

FINDINGS: A focal Baker's cyst is not identified by ultrasound. Instead, there
are a multitude of ill-defined areas of echogenic complex fluid
beginning posterior to the knee joint and continuing throughout the
posterior subcutaneous left calf nearly throughout the entire extent
of the calf. Findings are consistent with subcutaneous hemorrhage
and some of this hemorrhage may be within the posterior
fascial/intramuscular compartment. There is no indication for
aspiration or therapeutic injection today as a focal discrete
collection is not identified. The left calf on exam demonstrates
significant diffuse posterior ecchymosis and is also very tense and
exquisitely tender on exam.
IMPRESSION: Findings by ultrasound and physical exam today consistent with
extensive subcutaneous hemorrhage beginning at the level of the
popliteal fossa and dissecting throughout the posterior left calf
soft tissues. This is not representative of a focal Baker's cyst and
there was no indication today to proceed with aspiration and
therapeutic injection. Consider temporary cessation of
anticoagulation given the extensive hemorrhage visible by
ultrasound. Follow-up of symptoms and resolution of hemorrhage is
recommended as there may be some risk of development of a posterior
compartment syndrome if there is further hemorrhage.

## 2020-03-03 MED ORDER — TRAMADOL HCL 50 MG PO TABS: 50.0000 mg | ORAL_TABLET | Freq: Three times a day (TID) | ORAL | 0 refills | Status: AC | PRN

## 2020-03-04 ENCOUNTER — Other Ambulatory Visit: Payer: Medicare Other

## 2020-03-04 ENCOUNTER — Non-Acute Institutional Stay (SKILLED_NURSING_FACILITY): Payer: Medicare Other | Admitting: Adult Health

## 2020-03-04 ENCOUNTER — Encounter: Payer: Self-pay | Admitting: Adult Health

## 2020-03-04 DIAGNOSIS — I4821 Permanent atrial fibrillation: Secondary | ICD-10-CM

## 2020-03-04 DIAGNOSIS — M25561 Pain in right knee: Secondary | ICD-10-CM

## 2020-03-04 DIAGNOSIS — R350 Frequency of micturition: Secondary | ICD-10-CM | POA: Diagnosis not present

## 2020-03-04 DIAGNOSIS — D649 Anemia, unspecified: Secondary | ICD-10-CM | POA: Diagnosis not present

## 2020-03-04 DIAGNOSIS — K5903 Drug induced constipation: Secondary | ICD-10-CM | POA: Diagnosis not present

## 2020-03-04 DIAGNOSIS — I482 Chronic atrial fibrillation, unspecified: Secondary | ICD-10-CM | POA: Diagnosis not present

## 2020-03-04 DIAGNOSIS — R58 Hemorrhage, not elsewhere classified: Secondary | ICD-10-CM | POA: Diagnosis not present

## 2020-03-04 LAB — BASIC METABOLIC PANEL
BUN: 18 (ref 4–21)
CO2: 26 — AB (ref 13–22)
Chloride: 94 — AB (ref 99–108)
Creatinine: 0.9 (ref 0.5–1.1)
Glucose: 101
Potassium: 4.8 (ref 3.4–5.3)
Sodium: 133 — AB (ref 137–147)

## 2020-03-04 LAB — CBC AND DIFFERENTIAL
HCT: 32 — AB (ref 36–46)
Hemoglobin: 10.5 — AB (ref 12.0–16.0)
Platelets: 163 (ref 150–399)
WBC: 13.5

## 2020-03-04 LAB — HEPATIC FUNCTION PANEL
ALT: 15 (ref 7–35)
AST: 20 (ref 13–35)

## 2020-03-04 LAB — COMPREHENSIVE METABOLIC PANEL
Albumin: 3.4 — AB (ref 3.5–5.0)
Calcium: 9.2 (ref 8.7–10.7)
Globulin: 2.9

## 2020-03-04 LAB — CBC: RBC: 3.68 — AB (ref 3.87–5.11)

## 2020-03-04 NOTE — Progress Notes (Addendum)
Location:  Occupational psychologist of Service:  SNF (31) Provider:   Cindi Carbon, [place] [phone]   [name], DO  Patient Care Team: [name], DO as PCP - General (Geriatric Medicine) [name], MD as Consulting Physician (Dermatology) [name], MD as Consulting Physician (Cardiology) [facility] [name], MD as Consulting Physician (Dermatology)  Extended Emergency Contact Information Primary Emergency Contact: [name] of [place] Phone: [phone] Mobile Phone: [phone] Relation: Daughter Secondary Emergency Contact: [name],[name]  [name] of [place] Phone: [phone] Relation: Son  Code Status:  DNR Goals of care: Advanced Directive information Advanced Directives 03/02/2020  Does Patient Have a Medical Advance Directive? Yes  Type of Paramedic of [place];Living will  Does patient want to make changes to medical advance directive? No - Patient declined  Copy of [place] in Chart? Yes - validated most recent copy scanned in chart (See row information)  Pre-existing out of facility DNR order (yellow form or pink MOST form) -     Chief Complaint  Patient presents with  . Acute Visit    right knee pain and swelling, f/u  US results    HPI:  Pt is a 86 y.o. female seen today for an acute visit for right knee pain and f/u with ultrasound results of left knee.   Susan Conway has a hx of afib on eliquis for CVA risk reduction, complete HB s/p pacemaker, CAD s/p CABG, MI, varicose veins, GERD, rectal bleeding, and constipation among others.  She was experiencing left knee pain, swelling and bruising and was seen by Dr. Mariea Clonts on 02/25/20.  A CT of the knee was ordered which indicated a joint effusion and a bakers cyst. She moved to rehab last week due to pain and increased care needs from [place] at  [facility]. She has been using ultram and tylenol with only a small amt of relief. She was sent to IR for an ultrasound with possible drainage of the left knee on 4/28.  Dr. Margaretmary Dys note indicates that there is significant subcutaneous hemorrhage at the popliteal fossa and dissecting throughout the posterior left calf soft tissues. He did not feel it represented a Bakers cysts and suggest possible cessation of anticoagulation. This morning she has already take the Eliquis which she is on for afib.   As of 03/04/2020 she is reporting right knee mild pain swelling and small lump behind the right knee. There is pain with range of motion. She has been leaning on the right knee to shift weight off the left knee. She has not had any specific fall or injury. She denies any numbness, tingling, loss of function etc to either extremity. Prior to this illness she was ambulatory but now she is using a WC to go to the BR and only stands and pivots due to pain.   She also reports urinary frequency but no dysuria, bladder pain, fever, chills, etc.   Also reports constipation despite using miralax.       Past Medical History:  Diagnosis Date  . Actinic keratosis 05/25/2014  . Anxiety   . Atrial fibrillation ([place]) 09/21/2010  . Basal cell carcinoma 05/25/2014   Multiple removed by Dr. Danella Sensing in March 2015: right neck, left neck, scalp   . Bell's palsy 07/18/1979  . Cervicalgia 01/31/2012  . Closed fracture of lumbar vertebra without mention of spinal cord injury 07/17/2005  .  Conjunctiva disorder 12/26/2010  . Coronary atherosclerosis of native coronary artery 07/17/1997  . Cramp of limb 08/16/2009  . Degeneration of lumbar or lumbosacral intervertebral disc 01/31/2012  . Disturbance of skin sensation 08/2009  . Dizziness and giddiness 11/08/2011   vertigo  . External hemorrhoids without mention of complication [id number]  . External hemorrhoids without mention of complication [id number]  . Ganglion of  tendon sheath 08/16/2009  . Leg cramp 10/27/2013   Most frequently the right leg.   . Long term (current) use of anticoagulants 09/2010  . Lumbago 07/2009  . Meralgia paresthetica 07/2007  . MI, old   . Myalgia and myositis, unspecified 01/17/2012  . Osteoporosis   . Other abnormal blood chemistry 04/08/2012  . Other abnormal blood chemistry 2013   hyperglycemia  . Other disorder of muscle, ligament, and fascia 04/08/2012  . Other specified cardiac dysrhythmias(427.89) 06/27/2010  . Pain in joint, ankle and foot 10/27/2013   Bilateral since 1998   . Pain in joint, shoulder region 08/12/2012  . Pain in joint, upper arm 12/26/2010  . Pain in limb 01/11/2011  . Pain in thoracic spine 01/31/2012  . 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 PVC's (premature ventricular contractions)   . Rash and other nonspecific skin eruption 08/02/2011  . Senile osteoporosis 07/17/1993  . Unspecified essential hypertension 07/17/1997  . Varicose veins of lower extremities 08/16/2009  . Varicose veins of lower extremities 08/16/2009   Past Surgical History:  Procedure Laterality Date  . COLONOSCOPY WITH PROPOFOL N/A 10/19/2015   Procedure: COLONOSCOPY WITH PROPOFOL;  Surgeon: Gatha Mayer, MD;  Location: [facility];  Service: Endoscopy;  Laterality: N/A;  . CORONARY ARTERY BYPASS GRAFT  1998   x2; LIMA to LAD; SVG to diagonal off bypass  . HEMORRHOID SURGERY  08/26/2012   Dr. Brantley Stage  . LOOP RECORDER INSERTION N/A 05/13/2018   Procedure: LOOP RECORDER INSERTION;  Surgeon: [name], MD;  Location: [facility];  Service: Cardiovascular;  Laterality: N/A;  . LOOP RECORDER REMOVAL N/A 05/26/2019   Procedure: LOOP RECORDER REMOVAL;  Surgeon: [name], MD;  Location: [facility];  Service: Cardiovascular;  Laterality: N/A;  . MASS EXCISION Left 11/23/2015   Procedure: LEFT WRIST EXCISION CYST;  Surgeon: Leanora Cover, MD;  Location: [facility];  Service: Orthopedics;   Laterality: Left;  . PACEMAKER IMPLANT N/A 05/26/2019   Procedure: PACEMAKER IMPLANT;  Surgeon: [name], MD;  Location: [facility];  Service: Cardiovascular;  Laterality: N/A;  . SKIN BIOPSY  01/29/14   (R) neck; (R) scalp, 2 (L) neck; shave biopsy Superficial basal cell carcinoma Dr. Danella Sensing  . TONSILLECTOMY  1940's    Allergies  Allergen Reactions  . Iodinated Diagnostic Agents Nausea Only and Other (See Comments)    Severe nausea and also passed out  . Bactrim Rash  . Relafen [Nabumetone] Rash  . Sulfanilamide Rash    Outpatient Encounter Medications as of 03/04/2020  Medication Sig  . acetaminophen (TYLENOL) 500 MG tablet Take 500-1,000 mg by mouth every 6 (six) hours as needed (pain.).  Marland Kitchen apixaban (ELIQUIS) 2.5 MG TABS tablet Take 1 tablet (2.5 mg total) by mouth 2 (two) times daily.  . calcium-vitamin D (OSCAL WITH D) 500-200 MG-UNIT tablet Take 1 tablet by mouth daily.  . digoxin (LANOXIN) 0.125 MG tablet Take 1 tablet (125 mcg total) by mouth every other day.  Marland Kitchen LORazepam (ATIVAN) 1 MG tablet TAKE 1 TABLET AT BEDTIME  TO DECREASE ANXIETY AND HELP WITH SLEEP  . traMADol (ULTRAM) 50 MG tablet Take 1 tablet (50 mg total) by mouth 3 (three) times daily as needed for up to 7 days for severe pain.   No facility-administered encounter medications on file as of 03/04/2020.    Review of Systems  Constitutional: Positive for activity change. Negative for appetite change, chills, diaphoresis, fatigue, fever and unexpected weight change.  HENT: Negative for congestion, sore throat and trouble swallowing.   Respiratory: Negative for cough, shortness of breath and wheezing.   Cardiovascular: Positive for leg swelling. Negative for chest pain and palpitations.  Gastrointestinal: Positive for constipation. Negative for abdominal distention, abdominal pain and diarrhea.  Genitourinary: Positive for frequency. Negative for decreased urine volume, difficulty urinating,  dysuria, flank pain and hematuria.  Musculoskeletal: Positive for arthralgias, gait problem and joint swelling. Negative for back pain and myalgias.  Skin: Positive for color change. Negative for wound.  Neurological: Negative for dizziness, tremors, seizures, syncope, facial asymmetry, speech difficulty, weakness, light-headedness, numbness and headaches.  Psychiatric/Behavioral: Negative for agitation, behavioral problems and confusion.    Immunization History  Administered Date(s) Administered  . Influenza Whole 08/06/2012  . Influenza, High Dose Seasonal PF 08/15/2019  . Influenza,inj,Quad PF,6+ Mos 08/30/2018  . Influenza-Unspecified 08/06/2013, 08/24/2014, 08/26/2015, 08/31/2016, 08/27/2017  . Pneumococcal Conjugate-13 09/22/2015  . Pneumococcal Polysaccharide-23 08/29/2006  . Td 10/07/2007  . Zoster 03/12/2008  . Zoster Recombinat (Shingrix) 02/12/2018, 05/13/2018   Pertinent  Health Maintenance Due  Topic Date Due  . INFLUENZA VACCINE  06/06/2020  . DEXA SCAN  Completed  . PNA vac Low Risk Adult  Completed  . URINE MICROALBUMIN  Discontinued   Fall Risk  02/25/2020 01/28/2020 09/02/2019 04/02/2019 10/02/2018  Falls in the past year? 0 0 0 0 0  Number falls in past yr: 0 0 0 0 0  Comment - - - - -  Injury with Fall? 0 0 0 0 0   Functional Status Survey:    Vitals:   03/04/20 1130  BP: (!) 146/87  Pulse: 92  Resp: 20  Temp: 98.3 F (36.8 C)  SpO2: 92%   There is no height or weight on file to calculate BMI. Physical Exam Vitals and nursing note reviewed.  Constitutional:      General: She is not in acute distress.    Appearance: She is not diaphoretic.  HENT:     Head: Normocephalic and atraumatic.  Neck:     Vascular: No JVD.  Cardiovascular:     Rate and Rhythm: Normal rate and regular rhythm.     Heart sounds: No murmur.  Pulmonary:     Effort: Pulmonary effort is normal. No respiratory distress.     Breath sounds: Normal breath sounds. No wheezing.    Abdominal:     General: Bowel sounds are normal. There is no distension.     Palpations: Abdomen is soft.     Tenderness: There is no abdominal tenderness.  Musculoskeletal:        General: Swelling present.     Right knee: Swelling (mild with small mobile lump noted at posterior knee. ) present. No effusion or ecchymosis. Normal range of motion (pain with ROM). No LCL laxity, MCL laxity, ACL laxity or PCL laxity. Normal pulse.     Left knee: Swelling, effusion and ecchymosis (posterior) present. Decreased range of motion (pain with range of motion ). No LCL laxity, MCL laxity, ACL laxity or PCL laxity.Normal pulse.     Right  lower leg: 2+ Pitting Edema present.     Left lower leg: No edema.     Comments: +CMS to bilateral lower extremities +2BPPP   Skin:    General: Skin is warm and dry.  Neurological:     Mental Status: She is alert and oriented to person, place, and time.  Psychiatric:        Mood and Affect: Mood normal.     Labs reviewed: Recent Labs    03/25/19 0800 05/26/19 0812  NA 141 135  K 3.4 3.2*  CL  --  99  CO2  --  26  GLUCOSE  --  103*  BUN 26* 24*  CREATININE 1.1 0.97  CALCIUM  --  9.4   Recent Labs    03/25/19 0800  AST 20  ALT 10  ALKPHOS 68   Recent Labs    03/25/19 0000 05/26/19 0812 07/15/19 0000  WBC 4.6 5.1 5.0  NEUTROABS  --   --  2  HGB 13.1 11.9* 12.1  HCT 39 36.7 37  MCV  --  89.3  --   PLT 106* 108* 108*   Lab Results  Component Value Date   TSH 3.80 09/14/2015   Lab Results  Component Value Date   HGBA1C 6.0 03/25/2019   Lab Results  Component Value Date   CHOL 170 03/25/2019   HDL 53 03/25/2019   LDLCALC 106 03/25/2019   TRIG 54 03/25/2019   CHOLHDL 2 09/12/2010    Significant Diagnostic Results in last 30 days:  CT KNEE LEFT WO CONTRAST  Result Date: 02/25/2020 CLINICAL DATA:  Acute on chronic left knee pain and swelling EXAM: CT OF THE LEFT KNEE WITHOUT CONTRAST TECHNIQUE: Multidetector CT imaging of the left  knee was performed according to the standard protocol. Multiplanar CT image reconstructions were also generated. COMPARISON:  X-ray 01/09/2019 FINDINGS: Bones/Joint/Cartilage No acute fracture. No dislocation. Mild medial and lateral compartment joint space narrowing. Chondrocalcinosis of the menisci. Moderate-sized hyperdense knee joint effusion without fat-fluid level. Large heterogeneously hyperdense Baker's cyst measuring 3.7 x 2.3 cm trans-axially by at least 8.3 cm craniocaudally. The inferior extension of the collection is not entirely included within the field of view. Ligaments Suboptimally assessed by CT. ACL and PCL appear grossly intact. Muscles and Tendons No musculotendinous abnormality identified. Soft tissues Vascular calcifications. Soft tissues otherwise within normal limits. IMPRESSION: 1. Moderate-sized hyperdense knee joint effusion suggestive of a hemarthrosis. Etiology of which is not identified on this study. Correlate with serum coagulation studies. 2. Large heterogeneously hyperdense Baker's cyst measuring at least 3.7 x 2.3 x 8.3 cm, also likely representing blood products. 3. No acute fracture. 4. Mild medial and lateral compartment joint space narrowing and chondrocalcinosis of the menisci. Electronically Signed   By: Davina Poke D.O.   On: 02/25/2020 14:54   [place] LT LOWER EXTREM LTD SOFT TISSUE NON VASCULAR  Result Date: 03/03/2020 CLINICAL DATA:  Prior CT demonstrated a probable hemorrhagic Baker's cyst of the left popliteal fossa. The patient presents for therapeutic aspiration and injection of the Baker's cyst. She is anticoagulated with Eliquis and has significant bruising and pain of the left lower leg. EXAM: LEFT LOWER EXTREMITY SOFT TISSUE ULTRASOUND LIMITED TECHNIQUE: Ultrasound examination was performed of the soft tissues of the left popliteal fossa and left calf. COMPARISON:  CT of the left knee without contrast on 02/25/1999 FINDINGS: A focal Baker's cyst is not  identified by ultrasound. Instead, there are a multitude of ill-defined areas of echogenic complex  fluid beginning posterior to the knee joint and continuing throughout the posterior subcutaneous left calf nearly throughout the entire extent of the calf. Findings are consistent with subcutaneous hemorrhage and some of this hemorrhage may be within the posterior fascial/intramuscular compartment. There is no indication for aspiration or therapeutic injection today as a focal discrete collection is not identified. The left calf on exam demonstrates significant diffuse posterior ecchymosis and is also very tense and exquisitely tender on exam. IMPRESSION: Findings by ultrasound and physical exam today consistent with extensive subcutaneous hemorrhage beginning at the level of the popliteal fossa and dissecting throughout the posterior left calf soft tissues. This is not representative of a focal Baker's cyst and there was no indication today to proceed with aspiration and therapeutic injection. Consider temporary cessation of anticoagulation given the extensive hemorrhage visible by ultrasound. Follow-up of symptoms and resolution of hemorrhage is recommended as there may be some risk of development of a posterior compartment syndrome if there is further hemorrhage. Electronically Signed   By: Aletta Edouard M.D.   On: 03/03/2020 14:54   CUP PACEART REMOTE DEVICE CHECK  Result Date: 02/26/2020 Scheduled remote reviewed. Normal device function.  Known ventricular high threshold and amplitude.  Lead impedance stable. Next remote 91 days. Kathy Breach, RN, CCDS, [employer]   Assessment/Plan 1. Acute pain of right knee Discussed her case with Dr. Mariea Clonts and she recommended a CT of the right knee due to hx of hemorrhage in the left knee. Of note she is bearing more weight on the right due to the current issues with the left knee.  May need ortho referral. At this time she is already on a pain control  regimen with Tylenol and Ultram and should not take nsaids.   2. Hemorrhage Noted to the popliteal fossa and posterior left calf  Hold Eliquis and contact cardiology for further direction. Should hold therapy for 48hrs while Eliquis is on board and then resume. At this time there are no s/s of compartment syndrome. The patient was educated on the s/s of compartment syndrome and to report any of these to the staff immediately. Continue tylenol, ultram, and ice.  3. Permanent atrial fibrillation (HCC) Rate is controlled and regular on auscultation today. Messaged Dr. Lovena Le. If no response will call the office.   4. Urinary frequency No other signs of infection. May be due to tramadol and decreased mobility. Will have staff check a PVR.  Also will treat the constipation and see if this symptom improves.   5. Drug-induced constipation Change miralax to 17 grams qd and add senna s 2 tabs daily    Addendum: Dr. Lovena Le responded and agreed that we should stop the Eliquis due to bleeding. We will have the pt f/u with him in the office at discharge.   Family/ staff Communication: discussed with Ms. Janson and Dr. Mariea Clonts  Labs/tests ordered:  CBC CMP

## 2020-03-05 ENCOUNTER — Ambulatory Visit
Admission: RE | Admit: 2020-03-05 | Discharge: 2020-03-05 | Disposition: A | Discharge status: Home / Self Care | Payer: Medicare Other | Source: Ambulatory Visit | Attending: Adult Health | Admitting: Adult Health

## 2020-03-05 ENCOUNTER — Other Ambulatory Visit: Payer: Self-pay | Admitting: Adult Health

## 2020-03-05 DIAGNOSIS — M25461 Effusion, right knee: Secondary | ICD-10-CM | POA: Diagnosis not present

## 2020-03-05 DIAGNOSIS — R52 Pain, unspecified: Secondary | ICD-10-CM

## 2020-03-05 DIAGNOSIS — R609 Edema, unspecified: Secondary | ICD-10-CM

## 2020-03-05 DIAGNOSIS — M7121 Synovial cyst of popliteal space [Baker], right knee: Secondary | ICD-10-CM | POA: Diagnosis not present

## 2020-03-05 IMAGING — CT CT KNEE*R* W/O CM
3 series · 14 of 33 positions shown, 17 images · non-contrast
Comparison: [DATE]

CLINICAL DATA: Right knee pain with swelling and difficulty
bending.

EXAM:
CT OF THE right KNEE WITHOUT CONTRAST
TECHNIQUE: Multidetector CT imaging of the right knee was performed according
to the standard protocol. Multiplanar CT image reconstructions were
also generated.

[Series 5: sfov lower extremity 2.00 br40 s3 soft · axial · 0.29mm/px · z∈[+844,+990]mm · 6 of 96 slices shown, 8 images (1 of 3)]
[im 15/96  soft-tissue]
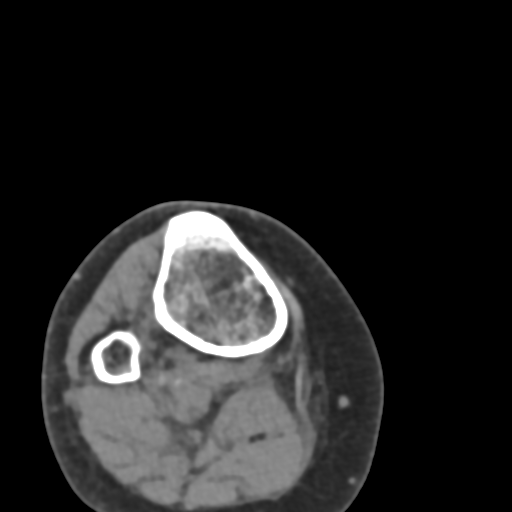
[im 15/96  bone]
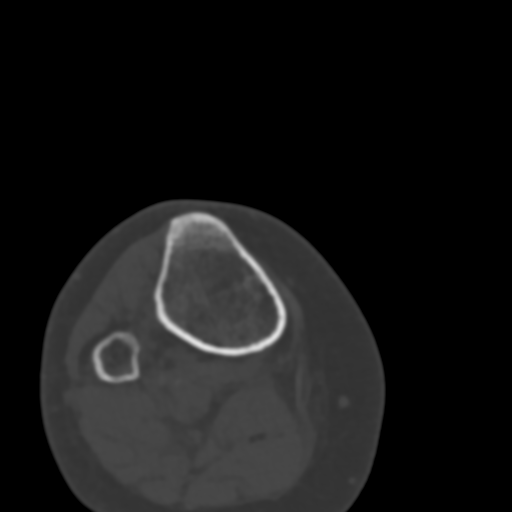
[im 30/96  bone]
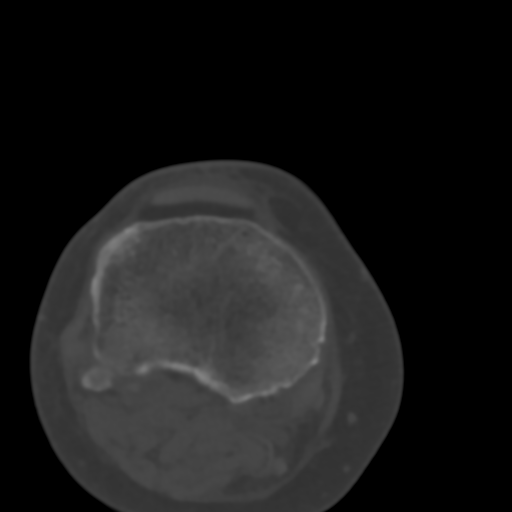
[im 44/96  bone]
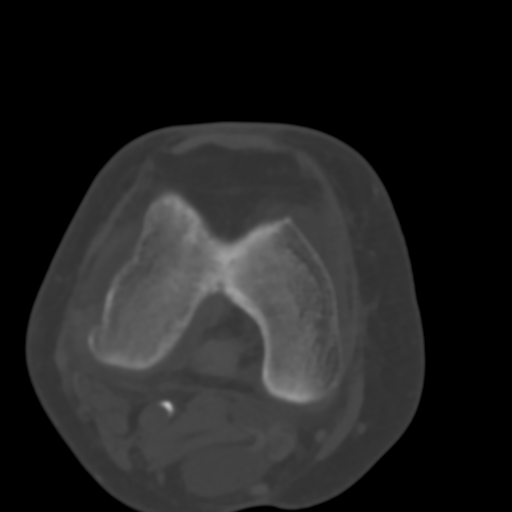
[im 59/96  bone]
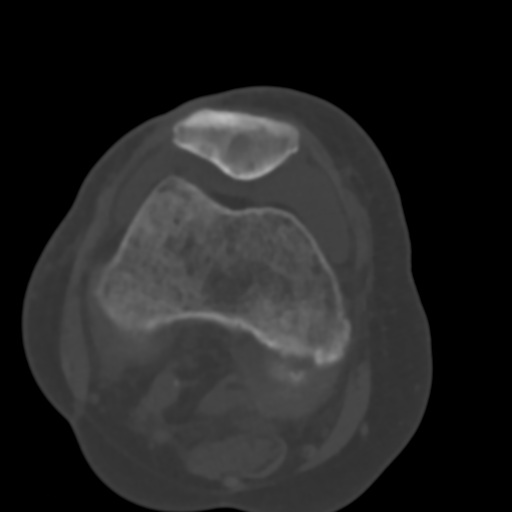
[im 74/96  soft-tissue]
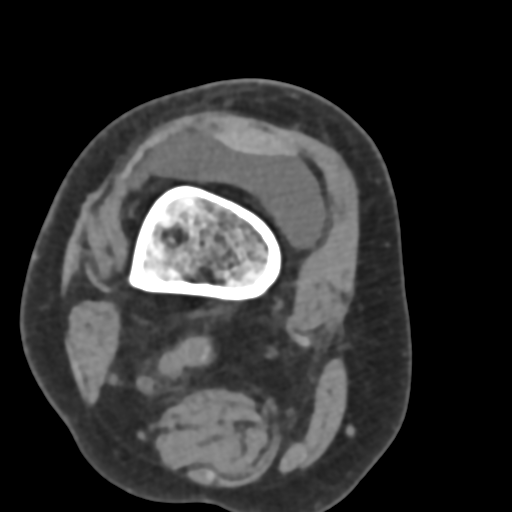
[im 74/96  bone]
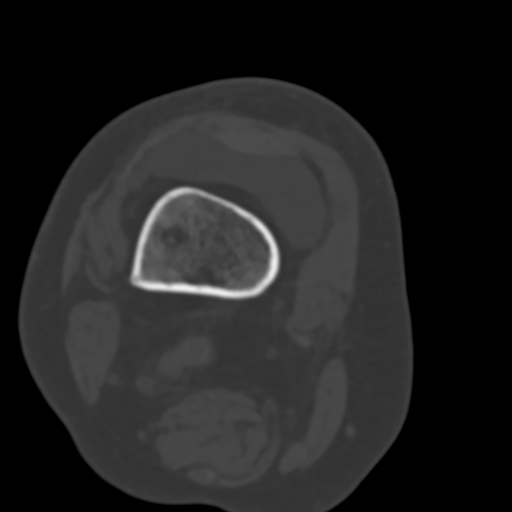
[im 88/96  bone]
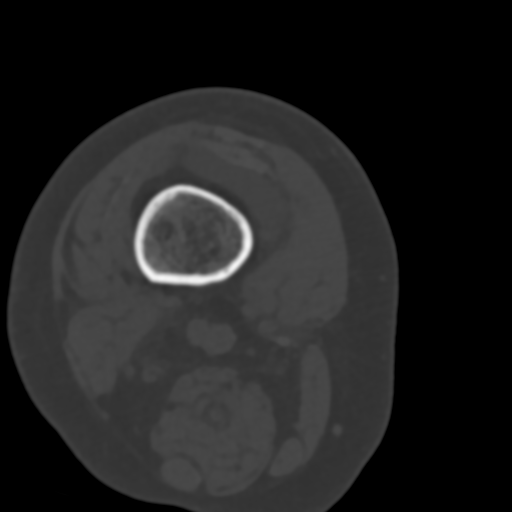

[Series 9: sfov lower extremity 2.00 br40 s3 soft · coronal · 0.29mm/px · 3 of 72 slices shown (2 of 3)]
[im 15/72  bone]
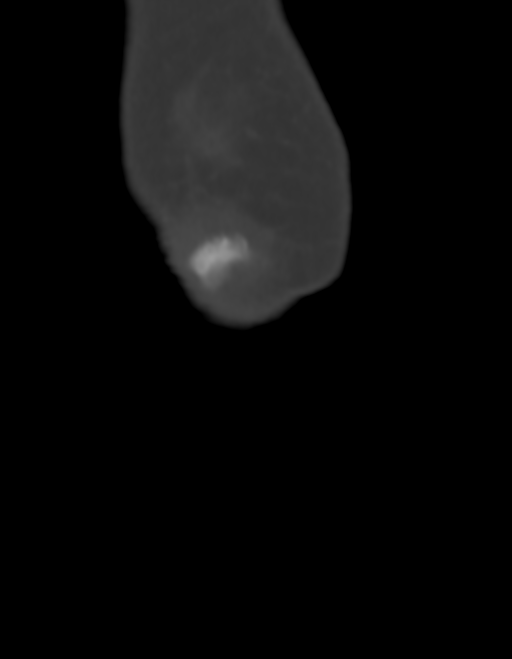
[im 29/72  bone]
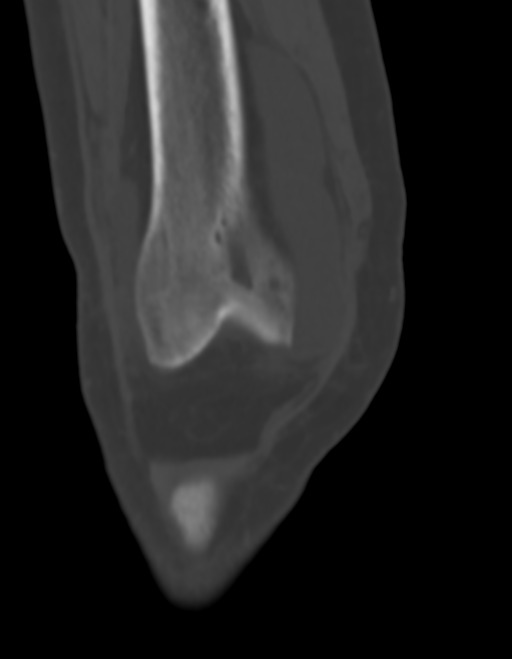
[im 43/72  bone]
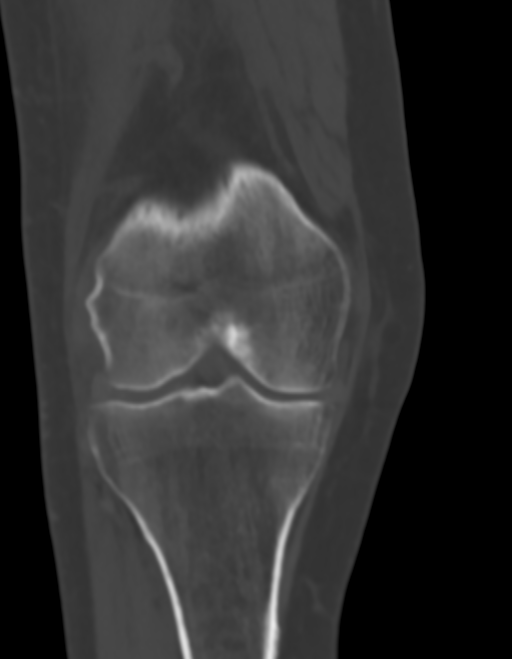

[Series 13: sfov lower extremity 2.00 br40 s3 soft · sagittal · 0.29mm/px · 5 of 74 slices shown, 6 images (3 of 3)]
[im 25/74  bone]
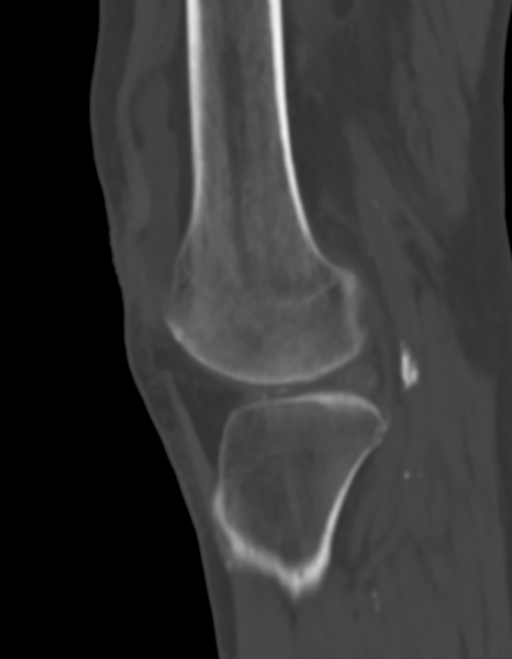
[im 31/74  bone]
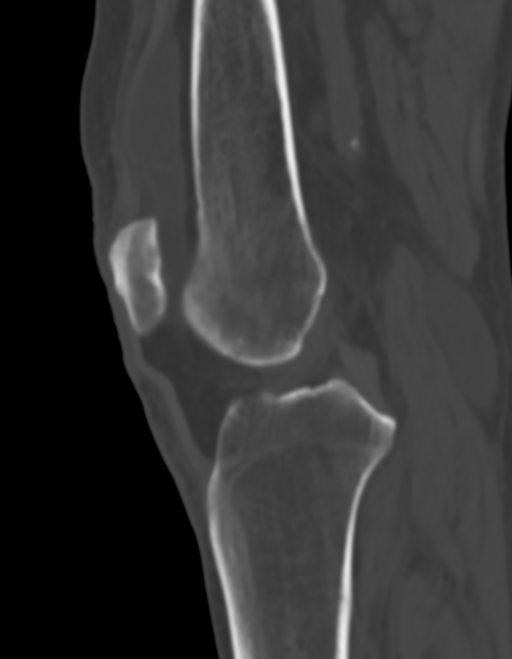
[im 37/74  soft-tissue]
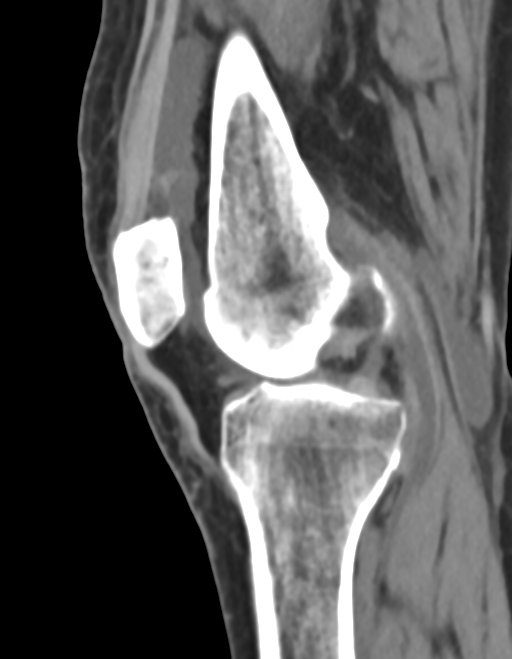
[im 37/74  bone]
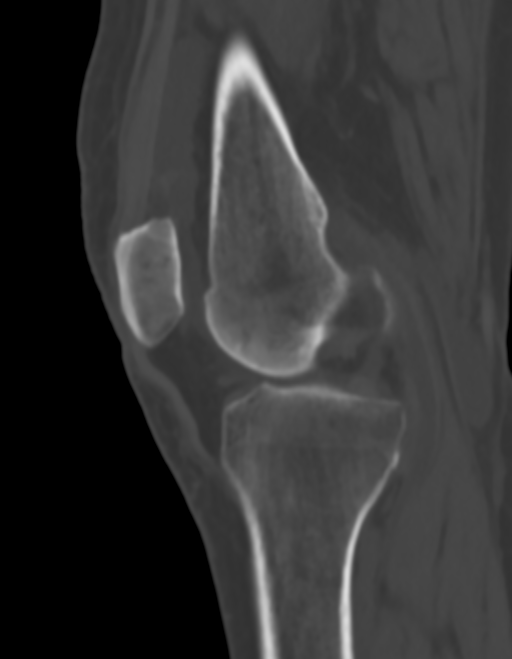
[im 43/74  bone]
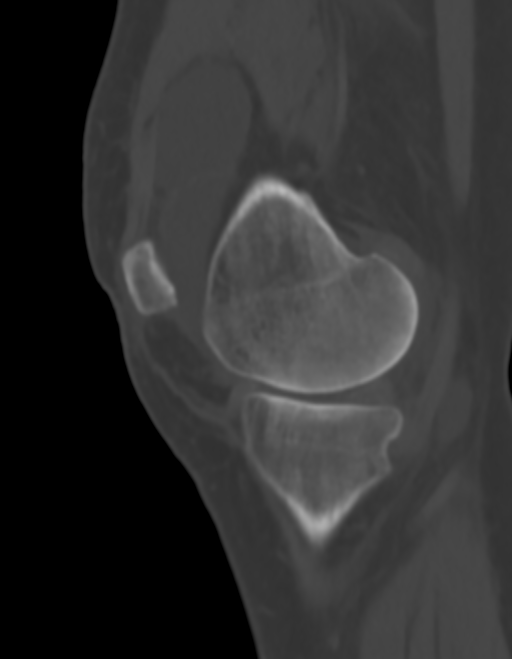
[im 49/74  bone]
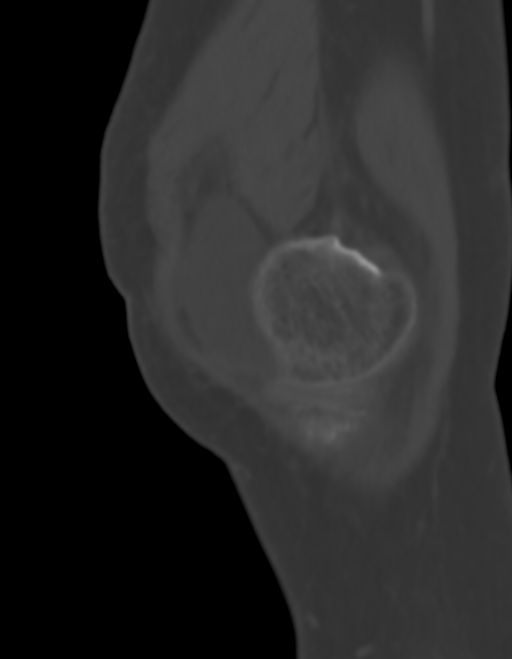

[14 of 33 positions shown; findings below may reference images not displayed]

FINDINGS: Bones/Joint/Cartilage

No fracture or acute bony findings. Moderate knee effusion. Moderate
size Baker's cyst.

Medial compartmental articular space narrowing compatible with
chondral thinning. Mild meniscal chondrocalcinosis. Suspected
capsular chondrocalcinosis posterior to the lateral femoral condyle.

Ligaments

Suboptimally assessed by CT. Contours of the ACL, PCL, and fibular
collateral ligament grossly intact. There is calcification
proximally in the MCL which could be from prior injury
(Pellegrini-Stieda disease) or a small amount of calcification
related to the chondrocalcinosis.

Muscles and Tendons

Unremarkable

Soft tissues

Atherosclerosis.
IMPRESSION: 1. Moderate knee effusion and moderate size Baker's cyst.
2. Medial compartmental articular space narrowing compatible with
chondral thinning.
3. Mild meniscal and capsular chondrocalcinosis, query CPPD
arthropathy.
4. Calcification proximally in the MCL which could be from prior
injury (Pellegrini-Stieda disease) or a small amount of calcific
tendinopathy related to the chondrocalcinosis.
5. Atherosclerosis.

## 2020-03-05 NOTE — Addendum Note (Signed)
Addended by: Barnie Mort on: 03/05/2020 12:05 PM   Modules accepted: Orders

## 2020-03-08 ENCOUNTER — Encounter: Payer: Self-pay | Admitting: Adult Health

## 2020-03-08 ENCOUNTER — Non-Acute Institutional Stay (SKILLED_NURSING_FACILITY): Payer: Medicare Other | Admitting: Adult Health

## 2020-03-08 DIAGNOSIS — R58 Hemorrhage, not elsewhere classified: Secondary | ICD-10-CM | POA: Diagnosis not present

## 2020-03-08 DIAGNOSIS — R350 Frequency of micturition: Secondary | ICD-10-CM

## 2020-03-08 DIAGNOSIS — K5903 Drug induced constipation: Secondary | ICD-10-CM | POA: Diagnosis not present

## 2020-03-08 DIAGNOSIS — M25561 Pain in right knee: Secondary | ICD-10-CM

## 2020-03-08 DIAGNOSIS — M25562 Pain in left knee: Secondary | ICD-10-CM | POA: Diagnosis not present

## 2020-03-08 NOTE — Progress Notes (Signed)
Location:  Occupational psychologist of Service:  SNF (31) Provider:   Cindi Carbon, [place] [phone]   [name], DO  Patient Care Team: [name], DO as PCP - General (Geriatric Medicine) [name], MD as Consulting Physician (Dermatology) [name], MD as Consulting Physician (Cardiology) [facility] [name], MD as Consulting Physician (Dermatology)  Extended Emergency Contact Information Primary Emergency Contact: [name] of [place] Phone: [phone] Mobile Phone: [phone] Relation: Daughter Secondary Emergency Contact: [name],[name]  [name] of [place] Phone: [phone] Relation: Son  Code Status:  DNR Goals of care: Advanced Directive information Advanced Directives 03/02/2020  Does Patient Have a Medical Advance Directive? Yes  Type of Paramedic of [place];Living will  Does patient want to make changes to medical advance directive? No - Patient declined  Copy of [place] in Chart? Yes - validated most recent copy scanned in chart (See row information)  Pre-existing out of facility DNR order (yellow form or pink MOST form) -     Chief Complaint  Patient presents with  . Acute Visit    knee pain    HPI:  Pt is a 86 y.o. female seen today for an acute visit for knee pain. Ms. Bubb is currently in skilled rehab due to decreased mobility and pain (previously was [place]).    Previous hx: She was experiencing left knee pain, swelling and bruising and was seen by Dr. Mariea Clonts on 02/25/20.  A CT of the knee was ordered which indicated a joint effusion and a bakers cyst. She was sent to IR for an ultrasound with possible drainage of the left knee on 4/28.  Dr. Margaretmary Dys note indicates that there is significant subcutaneous hemorrhage at the popliteal fossa and dissecting throughout the posterior left  calf soft tissues and that she did not need drainage. This was discussed with Dr. Lovena Le and Eliquis placed on hold on 4/29.  I saw her on 4/29 for right knee pain and swelling. A CT of the right knee was ordered which showed a moderate effusion and a bakers cyst as well.   Over the weekend she reports her pain and swelling has improved. She is anxious to get going with PT.  She has only used ultram one time in the past three days   She is moving her bowels well  Continues with urinary frequency at night but not during the day. She is not sleeping well in rehab. No fever, bladder pain, dysuria, etc.     Past Medical History:  Diagnosis Date  . Actinic keratosis 05/25/2014  . Anxiety   . Atrial fibrillation ([place]) 09/21/2010  . Basal cell carcinoma 05/25/2014   Multiple removed by Dr. Danella Sensing in March 2015: right neck, left neck, scalp   . Bell's palsy 07/18/1979  . Cervicalgia 01/31/2012  . Closed fracture of lumbar vertebra without mention of spinal cord injury 07/17/2005  . Conjunctiva disorder 12/26/2010  . Coronary atherosclerosis of native coronary artery 07/17/1997  . Cramp of limb 08/16/2009  . Degeneration of lumbar or lumbosacral intervertebral disc 01/31/2012  . Disturbance of skin sensation 08/2009  . Dizziness and giddiness 11/08/2011   vertigo  . External hemorrhoids without mention of complication [id number]  . External hemorrhoids without mention of complication [id number]  . Ganglion of tendon sheath 08/16/2009  . Leg cramp 10/27/2013   Most frequently the right leg.   Marland Kitchen  Long term (current) use of anticoagulants 09/2010  . Lumbago 07/2009  . Meralgia paresthetica 07/2007  . MI, old   . Myalgia and myositis, unspecified 01/17/2012  . Osteoporosis   . Other abnormal blood chemistry 04/08/2012  . Other abnormal blood chemistry 2013   hyperglycemia  . Other disorder of muscle, ligament, and fascia 04/08/2012  . Other specified cardiac dysrhythmias(427.89) 06/27/2010  . Pain  in joint, ankle and foot 10/27/2013   Bilateral since 1998   . Pain in joint, shoulder region 08/12/2012  . Pain in joint, upper arm 12/26/2010  . Pain in limb 01/11/2011  . Pain in thoracic spine 01/31/2012  . 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 PVC's (premature ventricular contractions)   . Rash and other nonspecific skin eruption 08/02/2011  . Senile osteoporosis 07/17/1993  . Unspecified essential hypertension 07/17/1997  . Varicose veins of lower extremities 08/16/2009  . Varicose veins of lower extremities 08/16/2009   Past Surgical History:  Procedure Laterality Date  . COLONOSCOPY WITH PROPOFOL N/A 10/19/2015   Procedure: COLONOSCOPY WITH PROPOFOL;  Surgeon: Gatha Mayer, MD;  Location: [facility];  Service: Endoscopy;  Laterality: N/A;  . CORONARY ARTERY BYPASS GRAFT  1998   x2; LIMA to LAD; SVG to diagonal off bypass  . HEMORRHOID SURGERY  08/26/2012   Dr. Brantley Stage  . LOOP RECORDER INSERTION N/A 05/13/2018   Procedure: LOOP RECORDER INSERTION;  Surgeon: [name], MD;  Location: [facility];  Service: Cardiovascular;  Laterality: N/A;  . LOOP RECORDER REMOVAL N/A 05/26/2019   Procedure: LOOP RECORDER REMOVAL;  Surgeon: [name], MD;  Location: [facility];  Service: Cardiovascular;  Laterality: N/A;  . MASS EXCISION Left 11/23/2015   Procedure: LEFT WRIST EXCISION CYST;  Surgeon: Leanora Cover, MD;  Location: [facility];  Service: Orthopedics;  Laterality: Left;  . PACEMAKER IMPLANT N/A 05/26/2019   Procedure: PACEMAKER IMPLANT;  Surgeon: [name], MD;  Location: [facility];  Service: Cardiovascular;  Laterality: N/A;  . SKIN BIOPSY  01/29/14   (R) neck; (R) scalp, 2 (L) neck; shave biopsy Superficial basal cell carcinoma Dr. Danella Sensing  . TONSILLECTOMY  1940's    Allergies  Allergen Reactions  . Iodinated Diagnostic Agents Nausea Only and Other (See Comments)    Severe nausea and also passed out  . Bactrim Rash    . Relafen [Nabumetone] Rash  . Sulfanilamide Rash    Outpatient Encounter Medications as of 03/08/2020  Medication Sig  . acetaminophen (TYLENOL) 500 MG tablet Take 1,000 mg by mouth in the morning, at noon, and at bedtime.   . calcium-vitamin D (OSCAL WITH D) 500-200 MG-UNIT tablet Take 1 tablet by mouth daily.  . digoxin (LANOXIN) 0.125 MG tablet Take 1 tablet (125 mcg total) by mouth every other day.  Marland Kitchen LORazepam (ATIVAN) 1 MG tablet TAKE 1 TABLET AT BEDTIME TO DECREASE ANXIETY AND HELP WITH SLEEP  . polyethylene glycol (MIRALAX / GLYCOLAX) 17 g packet Take 17 g by mouth daily.  Marland Kitchen senna-docusate (SENOKOT-S) 8.6-50 MG tablet Take 2 tablets by mouth daily.  . traMADol (ULTRAM) 50 MG tablet Take 1 tablet (50 mg total) by mouth 3 (three) times daily as needed for up to 7 days for severe pain. (Patient taking differently: Take 50 mg by mouth in the morning, at noon, and at bedtime. X 1 week)   No facility-administered encounter medications on file as of 03/08/2020.    Review of Systems  Constitutional: Positive for activity change. Negative for appetite change, chills, diaphoresis, fatigue, fever and unexpected weight change.  HENT: Negative for congestion.   Respiratory: Negative for cough, shortness of breath and wheezing.   Cardiovascular: Negative for chest pain, palpitations and leg swelling.  Gastrointestinal: Negative for abdominal distention, abdominal pain, constipation and diarrhea.  Genitourinary: Positive for frequency. Negative for decreased urine volume, difficulty urinating, dysuria and pelvic pain.  Musculoskeletal: Positive for arthralgias, gait problem and joint swelling. Negative for back pain and myalgias.  Neurological: Negative for dizziness, tremors, seizures, syncope, facial asymmetry, speech difficulty, weakness, light-headedness, numbness and headaches.  Psychiatric/Behavioral: Negative for agitation, behavioral problems and confusion.    Immunization History   Administered Date(s) Administered  . Influenza Whole 08/06/2012  . Influenza, High Dose Seasonal PF 08/15/2019  . Influenza,inj,Quad PF,6+ Mos 08/30/2018  . Influenza-Unspecified 08/06/2013, 08/24/2014, 08/26/2015, 08/31/2016, 08/27/2017  . Pneumococcal Conjugate-13 09/22/2015  . Pneumococcal Polysaccharide-23 08/29/2006  . Td 10/07/2007  . Zoster 03/12/2008  . Zoster Recombinat (Shingrix) 02/12/2018, 05/13/2018   Pertinent  Health Maintenance Due  Topic Date Due  . INFLUENZA VACCINE  06/06/2020  . DEXA SCAN  Completed  . PNA vac Low Risk Adult  Completed  . URINE MICROALBUMIN  Discontinued   Fall Risk  02/25/2020 01/28/2020 09/02/2019 04/02/2019 10/02/2018  Falls in the past year? 0 0 0 0 0  Number falls in past yr: 0 0 0 0 0  Comment - - - - -  Injury with Fall? 0 0 0 0 0   Functional Status Survey:    There were no vitals filed for this visit. There is no height or weight on file to calculate BMI. Physical Exam Vitals and nursing note reviewed.  Constitutional:      General: She is not in acute distress.    Appearance: She is not diaphoretic.  HENT:     Head: Normocephalic and atraumatic.  Neck:     Vascular: No JVD.  Cardiovascular:     Rate and Rhythm: Normal rate and regular rhythm.     Heart sounds: No murmur.  Pulmonary:     Effort: Pulmonary effort is normal. No respiratory distress.     Breath sounds: Normal breath sounds. No wheezing.  Abdominal:     General: Bowel sounds are normal. There is no distension.     Palpations: Abdomen is soft.     Tenderness: There is no abdominal tenderness. There is no right CVA tenderness or left CVA tenderness.  Musculoskeletal:        General: Swelling (bilateral knees (mild) ) present.     Right lower leg: No edema.     Left lower leg: Edema (+1 improved) present.     Comments: Posterior right knee with palpable cyst. No pain with ROM of right or left knee. MAE BPPP+2  +CMS to BLE  Skin:    General: Skin is warm and  dry.     Findings: Bruising (left posterior knee spreading down to the mid calf improved) present.  Neurological:     Mental Status: She is alert and oriented to person, place, and time.  Psychiatric:        Mood and Affect: Mood normal.     Labs reviewed: Recent Labs    03/25/19 0800 05/26/19 0812  NA 141 135  K 3.4 3.2*  CL  --  99  CO2  --  26  GLUCOSE  --  103*  BUN 26* 24*  CREATININE 1.1 0.97  CALCIUM  --  9.4   Recent Labs    03/25/19 0800  AST 20  ALT 10  ALKPHOS 68   Recent Labs    03/25/19 0000 05/26/19 0812 07/15/19 0000  WBC 4.6 5.1 5.0  NEUTROABS  --   --  2  HGB 13.1 11.9* 12.1  HCT 39 36.7 37  MCV  --  89.3  --   PLT 106* 108* 108*   Lab Results  Component Value Date   TSH 3.80 09/14/2015   Lab Results  Component Value Date   HGBA1C 6.0 03/25/2019   Lab Results  Component Value Date   CHOL 170 03/25/2019   HDL 53 03/25/2019   LDLCALC 106 03/25/2019   TRIG 54 03/25/2019   CHOLHDL 2 09/12/2010    Significant Diagnostic Results in last 30 days:  CT KNEE LEFT WO CONTRAST  Result Date: 02/25/2020 CLINICAL DATA:  Acute on chronic left knee pain and swelling EXAM: CT OF THE LEFT KNEE WITHOUT CONTRAST TECHNIQUE: Multidetector CT imaging of the left knee was performed according to the standard protocol. Multiplanar CT image reconstructions were also generated. COMPARISON:  X-ray 01/09/2019 FINDINGS: Bones/Joint/Cartilage No acute fracture. No dislocation. Mild medial and lateral compartment joint space narrowing. Chondrocalcinosis of the menisci. Moderate-sized hyperdense knee joint effusion without fat-fluid level. Large heterogeneously hyperdense Baker's cyst measuring 3.7 x 2.3 cm trans-axially by at least 8.3 cm craniocaudally. The inferior extension of the collection is not entirely included within the field of view. Ligaments Suboptimally assessed by CT. ACL and PCL appear grossly intact. Muscles and Tendons No musculotendinous abnormality  identified. Soft tissues Vascular calcifications. Soft tissues otherwise within normal limits. IMPRESSION: 1. Moderate-sized hyperdense knee joint effusion suggestive of a hemarthrosis. Etiology of which is not identified on this study. Correlate with serum coagulation studies. 2. Large heterogeneously hyperdense Baker's cyst measuring at least 3.7 x 2.3 x 8.3 cm, also likely representing blood products. 3. No acute fracture. 4. Mild medial and lateral compartment joint space narrowing and chondrocalcinosis of the menisci. Electronically Signed   By: Davina Poke D.O.   On: 02/25/2020 14:54   CT KNEE RIGHT WO CONTRAST  Result Date: 03/06/2020 CLINICAL DATA:  Right knee pain with swelling and difficulty bending. EXAM: CT OF THE right KNEE WITHOUT CONTRAST TECHNIQUE: Multidetector CT imaging of the right knee was performed according to the standard protocol. Multiplanar CT image reconstructions were also generated. COMPARISON:  10/12/2015 FINDINGS: Bones/Joint/Cartilage No fracture or acute bony findings. Moderate knee effusion. Moderate size Baker's cyst. Medial compartmental articular space narrowing compatible with chondral thinning. Mild meniscal chondrocalcinosis. Suspected capsular chondrocalcinosis posterior to the lateral femoral condyle. Ligaments Suboptimally assessed by CT. Contours of the ACL, PCL, and fibular collateral ligament grossly intact. There is calcification proximally in the MCL which could be from prior injury (Pellegrini-Stieda disease) or a small amount of calcification related to the chondrocalcinosis. Muscles and Tendons Unremarkable Soft tissues Atherosclerosis. IMPRESSION: 1. Moderate knee effusion and moderate size Baker's cyst. 2. Medial compartmental articular space narrowing compatible with chondral thinning. 3. Mild meniscal and capsular chondrocalcinosis, query CPPD arthropathy. 4. Calcification proximally in the MCL which could be from prior injury (Pellegrini-Stieda disease)  or a small amount of calcific tendinopathy related to the chondrocalcinosis. 5. Atherosclerosis. Electronically Signed   By: Van Clines M.D.   On: 03/06/2020 09:50   [place] LT LOWER EXTREM LTD SOFT TISSUE NON VASCULAR  Result Date: 03/03/2020 CLINICAL DATA:  Prior CT demonstrated a probable hemorrhagic Baker's cyst of the left popliteal fossa.  The patient presents for therapeutic aspiration and injection of the Baker's cyst. She is anticoagulated with Eliquis and has significant bruising and pain of the left lower leg. EXAM: LEFT LOWER EXTREMITY SOFT TISSUE ULTRASOUND LIMITED TECHNIQUE: Ultrasound examination was performed of the soft tissues of the left popliteal fossa and left calf. COMPARISON:  CT of the left knee without contrast on 02/25/1999 FINDINGS: A focal Baker's cyst is not identified by ultrasound. Instead, there are a multitude of ill-defined areas of echogenic complex fluid beginning posterior to the knee joint and continuing throughout the posterior subcutaneous left calf nearly throughout the entire extent of the calf. Findings are consistent with subcutaneous hemorrhage and some of this hemorrhage may be within the posterior fascial/intramuscular compartment. There is no indication for aspiration or therapeutic injection today as a focal discrete collection is not identified. The left calf on exam demonstrates significant diffuse posterior ecchymosis and is also very tense and exquisitely tender on exam. IMPRESSION: Findings by ultrasound and physical exam today consistent with extensive subcutaneous hemorrhage beginning at the level of the popliteal fossa and dissecting throughout the posterior left calf soft tissues. This is not representative of a focal Baker's cyst and there was no indication today to proceed with aspiration and therapeutic injection. Consider temporary cessation of anticoagulation given the extensive hemorrhage visible by ultrasound. Follow-up of symptoms and resolution  of hemorrhage is recommended as there may be some risk of development of a posterior compartment syndrome if there is further hemorrhage. Electronically Signed   By: Aletta Edouard M.D.   On: 03/03/2020 14:54   CUP PACEART REMOTE DEVICE CHECK  Result Date: 02/26/2020 Scheduled remote reviewed. Normal device function.  Known ventricular high threshold and amplitude.  Lead impedance stable. Next remote 91 days. Kathy Breach, RN, CCDS, [employer]   Assessment/Plan 1. Hemorrhage To left popliteal fossa. Improved range of motion, swelling, and pain to the left knee. Continue to hold Eliquis and f/u with Cardiology in two weeks about resuming  2. Acute pain of both knees Improved. Continue Ultram 50 mg tid prn x 1 more week. Continue scheduled tylenol.  Will work with therapy this week and monitor for improvement. May need f/u ortho apt if not improving with functionality (? Injection)  3. Drug-induced constipation Continue scheduled miralax and senokot s, may hold for loose stools  4. Urinary frequency Has no other symptoms and only present at night, likely due to her current state of health and lack of sleep. If no improvement is noted with increased mobility she could consider myrbetriq.     Family/ staff Communication: discussed with the resident and her daughter Netta Neat ordered:  NA

## 2020-03-09 DIAGNOSIS — M62562 Muscle wasting and atrophy, not elsewhere classified, left lower leg: Secondary | ICD-10-CM | POA: Diagnosis not present

## 2020-03-09 DIAGNOSIS — R278 Other lack of coordination: Secondary | ICD-10-CM | POA: Diagnosis not present

## 2020-03-09 DIAGNOSIS — M25562 Pain in left knee: Secondary | ICD-10-CM | POA: Diagnosis not present

## 2020-03-09 DIAGNOSIS — M25662 Stiffness of left knee, not elsewhere classified: Secondary | ICD-10-CM | POA: Diagnosis not present

## 2020-03-09 DIAGNOSIS — M7121 Synovial cyst of popliteal space [Baker], right knee: Secondary | ICD-10-CM | POA: Diagnosis not present

## 2020-03-09 DIAGNOSIS — M810 Age-related osteoporosis without current pathological fracture: Secondary | ICD-10-CM | POA: Diagnosis not present

## 2020-03-09 DIAGNOSIS — R2689 Other abnormalities of gait and mobility: Secondary | ICD-10-CM | POA: Diagnosis not present

## 2020-03-09 DIAGNOSIS — M7122 Synovial cyst of popliteal space [Baker], left knee: Secondary | ICD-10-CM | POA: Diagnosis not present

## 2020-03-09 DIAGNOSIS — M150 Primary generalized (osteo)arthritis: Secondary | ICD-10-CM | POA: Diagnosis not present

## 2020-03-10 ENCOUNTER — Encounter: Payer: Self-pay | Admitting: Internal Medicine

## 2020-03-10 DIAGNOSIS — M62562 Muscle wasting and atrophy, not elsewhere classified, left lower leg: Secondary | ICD-10-CM | POA: Diagnosis not present

## 2020-03-10 DIAGNOSIS — M25562 Pain in left knee: Secondary | ICD-10-CM | POA: Diagnosis not present

## 2020-03-10 DIAGNOSIS — M25662 Stiffness of left knee, not elsewhere classified: Secondary | ICD-10-CM | POA: Diagnosis not present

## 2020-03-10 DIAGNOSIS — R2689 Other abnormalities of gait and mobility: Secondary | ICD-10-CM | POA: Diagnosis not present

## 2020-03-10 DIAGNOSIS — R278 Other lack of coordination: Secondary | ICD-10-CM | POA: Diagnosis not present

## 2020-03-10 DIAGNOSIS — M7122 Synovial cyst of popliteal space [Baker], left knee: Secondary | ICD-10-CM | POA: Diagnosis not present

## 2020-03-11 DIAGNOSIS — R278 Other lack of coordination: Secondary | ICD-10-CM | POA: Diagnosis not present

## 2020-03-11 DIAGNOSIS — M25562 Pain in left knee: Secondary | ICD-10-CM | POA: Diagnosis not present

## 2020-03-11 DIAGNOSIS — R2689 Other abnormalities of gait and mobility: Secondary | ICD-10-CM | POA: Diagnosis not present

## 2020-03-11 DIAGNOSIS — M7122 Synovial cyst of popliteal space [Baker], left knee: Secondary | ICD-10-CM | POA: Diagnosis not present

## 2020-03-11 DIAGNOSIS — M25662 Stiffness of left knee, not elsewhere classified: Secondary | ICD-10-CM | POA: Diagnosis not present

## 2020-03-11 DIAGNOSIS — M62562 Muscle wasting and atrophy, not elsewhere classified, left lower leg: Secondary | ICD-10-CM | POA: Diagnosis not present

## 2020-03-12 DIAGNOSIS — R278 Other lack of coordination: Secondary | ICD-10-CM | POA: Diagnosis not present

## 2020-03-12 DIAGNOSIS — M25662 Stiffness of left knee, not elsewhere classified: Secondary | ICD-10-CM | POA: Diagnosis not present

## 2020-03-12 DIAGNOSIS — M25562 Pain in left knee: Secondary | ICD-10-CM | POA: Diagnosis not present

## 2020-03-12 DIAGNOSIS — M7122 Synovial cyst of popliteal space [Baker], left knee: Secondary | ICD-10-CM | POA: Diagnosis not present

## 2020-03-12 DIAGNOSIS — R2689 Other abnormalities of gait and mobility: Secondary | ICD-10-CM | POA: Diagnosis not present

## 2020-03-12 DIAGNOSIS — M62562 Muscle wasting and atrophy, not elsewhere classified, left lower leg: Secondary | ICD-10-CM | POA: Diagnosis not present

## 2020-03-15 ENCOUNTER — Encounter: Payer: Self-pay | Admitting: Adult Health

## 2020-03-15 ENCOUNTER — Non-Acute Institutional Stay (SKILLED_NURSING_FACILITY): Payer: Medicare Other | Admitting: Adult Health

## 2020-03-15 DIAGNOSIS — K5901 Slow transit constipation: Secondary | ICD-10-CM

## 2020-03-15 DIAGNOSIS — M25562 Pain in left knee: Secondary | ICD-10-CM

## 2020-03-15 DIAGNOSIS — I4821 Permanent atrial fibrillation: Secondary | ICD-10-CM | POA: Diagnosis not present

## 2020-03-15 DIAGNOSIS — I10 Essential (primary) hypertension: Secondary | ICD-10-CM

## 2020-03-15 DIAGNOSIS — R2689 Other abnormalities of gait and mobility: Secondary | ICD-10-CM | POA: Diagnosis not present

## 2020-03-15 DIAGNOSIS — R58 Hemorrhage, not elsewhere classified: Secondary | ICD-10-CM

## 2020-03-15 DIAGNOSIS — M62562 Muscle wasting and atrophy, not elsewhere classified, left lower leg: Secondary | ICD-10-CM | POA: Diagnosis not present

## 2020-03-15 DIAGNOSIS — M25561 Pain in right knee: Secondary | ICD-10-CM

## 2020-03-15 DIAGNOSIS — M7122 Synovial cyst of popliteal space [Baker], left knee: Secondary | ICD-10-CM | POA: Diagnosis not present

## 2020-03-15 DIAGNOSIS — R278 Other lack of coordination: Secondary | ICD-10-CM | POA: Diagnosis not present

## 2020-03-15 DIAGNOSIS — M7121 Synovial cyst of popliteal space [Baker], right knee: Secondary | ICD-10-CM | POA: Diagnosis not present

## 2020-03-15 DIAGNOSIS — M25662 Stiffness of left knee, not elsewhere classified: Secondary | ICD-10-CM | POA: Diagnosis not present

## 2020-03-15 NOTE — Progress Notes (Signed)
Location:  Occupational psychologist of Service:  SNF (31)  Provider:  Cindi Carbon, [place] [phone]   PCP: [name], DO Patient Care Team: [name], DO as PCP - General (Geriatric Medicine) [name], MD as Consulting Physician (Dermatology) [name], MD as Consulting Physician (Cardiology) [facility], [facility] [name], MD as Consulting Physician (Dermatology)  Extended Emergency Contact Information Primary Emergency Contact: [name] of [place] Phone: [phone] Mobile Phone: [phone] Relation: Daughter Secondary Emergency Contact: [name],[name]  [name] of [place] Phone: [phone] Relation: Son  Code Status: DNR Goals of care:  Advanced Directive information Advanced Directives 03/02/2020  Does Patient Have a Medical Advance Directive? Yes  Type of Paramedic of [place];Living will  Does patient want to make changes to medical advance directive? No - Patient declined  Copy of [place] in Chart? Yes - validated most recent copy scanned in chart (See row information)  Pre-existing out of facility DNR order (yellow form or pink MOST form) -     Allergies  Allergen Reactions  . Iodinated Diagnostic Agents Nausea Only and Other (See Comments)    Severe nausea and also passed out  . Bactrim Rash  . Relafen [Nabumetone] Rash  . Sulfanilamide Rash    Chief Complaint  Patient presents with  . Discharge Note    HPI:  86 y.o. female seen for discharge from [facility] skilled rehab.   Previous hx: She was experiencing left knee pain, swelling and bruising and was seen by Dr. Mariea Clonts on 02/25/20.  A CT of the knee was ordered which indicated a joint effusion and a bakers cyst. She was sent to IR for an ultrasound with possible drainage of the left knee on 4/28.  Dr. Margaretmary Dys note indicates that there is  significant subcutaneous hemorrhage at the popliteal fossa and dissecting throughout the posterior left calf soft tissues and that she did not need drainage. This was discussed with Dr. Lovena Le and Eliquis placed on hold on 4/29.  I saw her on 4/29 for right knee pain and swelling. A CT of the right knee was ordered which showed a moderate effusion and a bakers cyst as well.  As of today she is ambulating well, dressing and bathing herself, and is back to her previous level of function. She is only using tylenol for pain. She states that she no loner has pain just a mild discomfort when walking on the left and no issues on the right. She had some issues sleeping in rehab but she thinks it will get better when she goes home. Her bowels are moving well now that she is taking laxatives. She also had some urinary frequency which improved.   Her bp was 144/78 03/15/2020 Other numbers in matrix are elevated up to 170 but were taken with an automatic cuff.   Past Medical History:  Diagnosis Date  . Actinic keratosis 05/25/2014  . Anxiety   . Atrial fibrillation ([place]) 09/21/2010  . Basal cell carcinoma 05/25/2014   Multiple removed by Dr. Danella Sensing in March 2015: right neck, left neck, scalp   . Bell's palsy 07/18/1979  . Cervicalgia 01/31/2012  . Closed fracture of lumbar vertebra without mention of spinal cord injury 07/17/2005  . Conjunctiva disorder 12/26/2010  . Coronary atherosclerosis of native coronary artery 07/17/1997  . Cramp of limb 08/16/2009  . Degeneration of lumbar or lumbosacral intervertebral disc 01/31/2012  .  Disturbance of skin sensation 08/2009  . Dizziness and giddiness 11/08/2011   vertigo  . External hemorrhoids without mention of complication [id number]  . External hemorrhoids without mention of complication [id number]  . Ganglion of tendon sheath 08/16/2009  . Leg cramp 10/27/2013   Most frequently the right leg.   . Long term (current) use of anticoagulants 09/2010  .  Lumbago 07/2009  . Meralgia paresthetica 07/2007  . MI, old   . Myalgia and myositis, unspecified 01/17/2012  . Osteoporosis   . Other abnormal blood chemistry 04/08/2012  . Other abnormal blood chemistry 2013   hyperglycemia  . Other disorder of muscle, ligament, and fascia 04/08/2012  . Other specified cardiac dysrhythmias(427.89) 06/27/2010  . Pain in joint, ankle and foot 10/27/2013   Bilateral since 1998   . Pain in joint, shoulder region 08/12/2012  . Pain in joint, upper arm 12/26/2010  . Pain in limb 01/11/2011  . Pain in thoracic spine 01/31/2012  . 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 PVC's (premature ventricular contractions)   . Rash and other nonspecific skin eruption 08/02/2011  . Senile osteoporosis 07/17/1993  . Unspecified essential hypertension 07/17/1997  . Varicose veins of lower extremities 08/16/2009  . Varicose veins of lower extremities 08/16/2009    Past Surgical History:  Procedure Laterality Date  . COLONOSCOPY WITH PROPOFOL N/A 10/19/2015   Procedure: COLONOSCOPY WITH PROPOFOL;  Surgeon: Gatha Mayer, MD;  Location: [facility];  Service: Endoscopy;  Laterality: N/A;  . CORONARY ARTERY BYPASS GRAFT  1998   x2; LIMA to LAD; SVG to diagonal off bypass  . HEMORRHOID SURGERY  08/26/2012   Dr. Brantley Stage  . LOOP RECORDER INSERTION N/A 05/13/2018   Procedure: LOOP RECORDER INSERTION;  Surgeon: [name], MD;  Location: [facility];  Service: Cardiovascular;  Laterality: N/A;  . LOOP RECORDER REMOVAL N/A 05/26/2019   Procedure: LOOP RECORDER REMOVAL;  Surgeon: [name], MD;  Location: [facility];  Service: Cardiovascular;  Laterality: N/A;  . MASS EXCISION Left 11/23/2015   Procedure: LEFT WRIST EXCISION CYST;  Surgeon: Leanora Cover, MD;  Location: [facility];  Service: Orthopedics;  Laterality: Left;  . PACEMAKER IMPLANT N/A 05/26/2019   Procedure: PACEMAKER IMPLANT;  Surgeon: [name], MD;  Location: [facility];  Service: Cardiovascular;  Laterality: N/A;  . SKIN BIOPSY  01/29/14   (R) neck; (R) scalp, 2 (L) neck; shave biopsy Superficial basal cell carcinoma Dr. Danella Sensing  . TONSILLECTOMY  1940's      reports that she has never smoked. She has never used smokeless tobacco. She reports current alcohol use. She reports that she does not use drugs. Social History   Socioeconomic History  . Marital status: Widowed    Spouse name: Not on file  . Number of children: Not on file  . Years of education: Not on file  . Highest education level: Not on file  Occupational History  . Occupation: Retired Pharmacist, hospital  Tobacco Use  . Smoking status: Never Smoker  . Smokeless tobacco: Never Used  Substance and Sexual Activity  . Alcohol use: Yes    Comment: occasional wine  . Drug use: No  . Sexual activity: Never  Other Topics Concern  . Not on file  Social History Narrative   Living at [facility] since 2010   Widowed 09/03/2016   Never smoked   Alcohol occasional wine   Exercise, house work    DNR , POA,  Living Will   Social Determinants of Health   Financial Resource Strain:   . Difficulty of Paying Living Expenses:   Food Insecurity:   . Worried About Charity fundraiser in the Last Year:   . Arboriculturist in the Last Year:   Transportation Needs:   . Film/video editor (Medical):   Marland Kitchen Lack of Transportation (Non-Medical):   Physical Activity:   . Days of Exercise per Week:   . Minutes of Exercise per Session:   Stress:   . Feeling of Stress :   Social Connections:   . Frequency of Communication with Friends and Family:   . Frequency of Social Gatherings with Friends and Family:   . Attends Religious Services:   . Active Member of Clubs or Organizations:   . Attends Archivist Meetings:   Marland Kitchen Marital Status:   Intimate Partner Violence:   . Fear of Current or Ex-Partner:   . Emotionally Abused:   Marland Kitchen Physically Abused:   . Sexually Abused:    Functional Status  Survey:    Allergies  Allergen Reactions  . Iodinated Diagnostic Agents Nausea Only and Other (See Comments)    Severe nausea and also passed out  . Bactrim Rash  . Relafen [Nabumetone] Rash  . Sulfanilamide Rash    Pertinent  Health Maintenance Due  Topic Date Due  . INFLUENZA VACCINE  06/06/2020  . DEXA SCAN  Completed  . PNA vac Low Risk Adult  Completed  . URINE MICROALBUMIN  Discontinued    Medications: Outpatient Encounter Medications as of 03/15/2020  Medication Sig  . Melatonin 3 MG CAPS Take 3 mg by mouth at bedtime.  Marland Kitchen acetaminophen (TYLENOL) 500 MG tablet Take 1,000 mg by mouth in the morning, at noon, and at bedtime.   . calcium-vitamin D (OSCAL WITH D) 500-200 MG-UNIT tablet Take 1 tablet by mouth daily.  . digoxin (LANOXIN) 0.125 MG tablet Take 1 tablet (125 mcg total) by mouth every other day.  Marland Kitchen LORazepam (ATIVAN) 1 MG tablet TAKE 1 TABLET AT BEDTIME TO DECREASE ANXIETY AND HELP WITH SLEEP (Patient taking differently: 0.5 mg at bedtime. )  . polyethylene glycol (MIRALAX / GLYCOLAX) 17 g packet Take 17 g by mouth daily.  Marland Kitchen senna-docusate (SENOKOT-S) 8.6-50 MG tablet Take 2 tablets by mouth daily.   No facility-administered encounter medications on file as of 03/15/2020.    Review of Systems  Constitutional: Negative for activity change, appetite change, chills, diaphoresis, fatigue, fever and unexpected weight change.  HENT: Negative for congestion.   Respiratory: Negative for cough, shortness of breath and wheezing.   Cardiovascular: Positive for leg swelling. Negative for chest pain and palpitations.  Gastrointestinal: Negative for abdominal distention, abdominal pain, constipation and diarrhea.  Genitourinary: Negative for difficulty urinating and dysuria.  Musculoskeletal: Positive for arthralgias and gait problem. Negative for back pain, joint swelling and myalgias.  Neurological: Negative for dizziness, tremors, seizures, syncope, facial asymmetry, speech  difficulty, weakness, light-headedness, numbness and headaches.  Psychiatric/Behavioral: Negative for agitation, behavioral problems and confusion.    Vitals:   03/15/20 1108  BP: (!) 165/67  Pulse: 94  Resp: 20  Temp: 98.8 F (37.1 C)  SpO2: 97%   There is no height or weight on file to calculate BMI. Physical Exam Vitals and nursing note reviewed.  Constitutional:      General: She is not in acute distress.    Appearance: She is not diaphoretic.  HENT:  Head: Normocephalic and atraumatic.  Neck:     Vascular: No JVD.  Cardiovascular:     Rate and Rhythm: Normal rate. Rhythm irregular.     Heart sounds: No murmur.  Pulmonary:     Effort: Pulmonary effort is normal. No respiratory distress.     Breath sounds: Normal breath sounds. No wheezing.  Abdominal:     General: Bowel sounds are normal.     Palpations: Abdomen is soft.  Musculoskeletal:        General: No tenderness, deformity or signs of injury.     Right lower leg: No edema.     Left lower leg: Edema (+1) present.  Skin:    General: Skin is warm and dry.     Findings: Bruising (improved but still noted to posterior knee and calf area ) present.  Neurological:     Mental Status: She is alert and oriented to person, place, and time.     Labs reviewed: Basic Metabolic Panel: Recent Labs    03/25/19 0800 05/26/19 0812 03/04/20 0955  NA 141 135 133*  K 3.4 3.2* 4.8  CL  --  99 94*  CO2  --  26 26*  GLUCOSE  --  103*  --   BUN 26* 24* 18  CREATININE 1.1 0.97 0.9  CALCIUM  --  9.4 9.2   Liver Function Tests: Recent Labs    03/25/19 0800 03/04/20 0955  AST 20 20  ALT 10 15  ALKPHOS 68  --   ALBUMIN  --  3.4*   No results for input(s): LIPASE, AMYLASE in the last 8760 hours. No results for input(s): AMMONIA in the last 8760 hours. CBC: Recent Labs    05/26/19 0812 07/15/19 0000 03/04/20 0955  WBC 5.1 5.0 13.5  NEUTROABS  --  2  --   HGB 11.9* 12.1 10.5*  HCT 36.7 37 32*  MCV 89.3  --    --   PLT 108* 108* 163   Cardiac Enzymes: No results for input(s): CKTOTAL, CKMB, CKMBINDEX, TROPONINI in the last 8760 hours. BNP: Invalid input(s): POCBNP CBG: No results for input(s): GLUCAP in the last 8760 hours.  Procedures and Imaging Studies During Stay: CT KNEE LEFT WO CONTRAST  Result Date: 02/25/2020 CLINICAL DATA:  Acute on chronic left knee pain and swelling EXAM: CT OF THE LEFT KNEE WITHOUT CONTRAST TECHNIQUE: Multidetector CT imaging of the left knee was performed according to the standard protocol. Multiplanar CT image reconstructions were also generated. COMPARISON:  X-ray 01/09/2019 FINDINGS: Bones/Joint/Cartilage No acute fracture. No dislocation. Mild medial and lateral compartment joint space narrowing. Chondrocalcinosis of the menisci. Moderate-sized hyperdense knee joint effusion without fat-fluid level. Large heterogeneously hyperdense Baker's cyst measuring 3.7 x 2.3 cm trans-axially by at least 8.3 cm craniocaudally. The inferior extension of the collection is not entirely included within the field of view. Ligaments Suboptimally assessed by CT. ACL and PCL appear grossly intact. Muscles and Tendons No musculotendinous abnormality identified. Soft tissues Vascular calcifications. Soft tissues otherwise within normal limits. IMPRESSION: 1. Moderate-sized hyperdense knee joint effusion suggestive of a hemarthrosis. Etiology of which is not identified on this study. Correlate with serum coagulation studies. 2. Large heterogeneously hyperdense Baker's cyst measuring at least 3.7 x 2.3 x 8.3 cm, also likely representing blood products. 3. No acute fracture. 4. Mild medial and lateral compartment joint space narrowing and chondrocalcinosis of the menisci. Electronically Signed   By: Davina Poke D.O.   On: 02/25/2020 14:54   CT KNEE  RIGHT WO CONTRAST  Result Date: 03/06/2020 CLINICAL DATA:  Right knee pain with swelling and difficulty bending. EXAM: CT OF THE right KNEE  WITHOUT CONTRAST TECHNIQUE: Multidetector CT imaging of the right knee was performed according to the standard protocol. Multiplanar CT image reconstructions were also generated. COMPARISON:  10/12/2015 FINDINGS: Bones/Joint/Cartilage No fracture or acute bony findings. Moderate knee effusion. Moderate size Baker's cyst. Medial compartmental articular space narrowing compatible with chondral thinning. Mild meniscal chondrocalcinosis. Suspected capsular chondrocalcinosis posterior to the lateral femoral condyle. Ligaments Suboptimally assessed by CT. Contours of the ACL, PCL, and fibular collateral ligament grossly intact. There is calcification proximally in the MCL which could be from prior injury (Pellegrini-Stieda disease) or a small amount of calcification related to the chondrocalcinosis. Muscles and Tendons Unremarkable Soft tissues Atherosclerosis. IMPRESSION: 1. Moderate knee effusion and moderate size Baker's cyst. 2. Medial compartmental articular space narrowing compatible with chondral thinning. 3. Mild meniscal and capsular chondrocalcinosis, query CPPD arthropathy. 4. Calcification proximally in the MCL which could be from prior injury (Pellegrini-Stieda disease) or a small amount of calcific tendinopathy related to the chondrocalcinosis. 5. Atherosclerosis. Electronically Signed   By: Van Clines M.D.   On: 03/06/2020 09:50   [place] LT LOWER EXTREM LTD SOFT TISSUE NON VASCULAR  Result Date: 03/03/2020 CLINICAL DATA:  Prior CT demonstrated a probable hemorrhagic Baker's cyst of the left popliteal fossa. The patient presents for therapeutic aspiration and injection of the Baker's cyst. She is anticoagulated with Eliquis and has significant bruising and pain of the left lower leg. EXAM: LEFT LOWER EXTREMITY SOFT TISSUE ULTRASOUND LIMITED TECHNIQUE: Ultrasound examination was performed of the soft tissues of the left popliteal fossa and left calf. COMPARISON:  CT of the left knee without contrast on  02/25/1999 FINDINGS: A focal Baker's cyst is not identified by ultrasound. Instead, there are a multitude of ill-defined areas of echogenic complex fluid beginning posterior to the knee joint and continuing throughout the posterior subcutaneous left calf nearly throughout the entire extent of the calf. Findings are consistent with subcutaneous hemorrhage and some of this hemorrhage may be within the posterior fascial/intramuscular compartment. There is no indication for aspiration or therapeutic injection today as a focal discrete collection is not identified. The left calf on exam demonstrates significant diffuse posterior ecchymosis and is also very tense and exquisitely tender on exam. IMPRESSION: Findings by ultrasound and physical exam today consistent with extensive subcutaneous hemorrhage beginning at the level of the popliteal fossa and dissecting throughout the posterior left calf soft tissues. This is not representative of a focal Baker's cyst and there was no indication today to proceed with aspiration and therapeutic injection. Consider temporary cessation of anticoagulation given the extensive hemorrhage visible by ultrasound. Follow-up of symptoms and resolution of hemorrhage is recommended as there may be some risk of development of a posterior compartment syndrome if there is further hemorrhage. Electronically Signed   By: Aletta Edouard M.D.   On: 03/03/2020 14:54   CUP PACEART REMOTE DEVICE CHECK  Result Date: 02/26/2020 Scheduled remote reviewed. Normal device function.  Known ventricular high threshold and amplitude.  Lead impedance stable. Next remote 91 days. Kathy Breach, RN, CCDS, [employer]   Assessment/Plan:   1. Hemorrhage Improved on the left leg. Possibly due to ruptured bakers cyst vs spontaneous. She has worked with therapy and is now ambulating and independent in all ADLs. Eliquis remains on hold, see below. Ok for discharge to [place] with f/u with Dr. Mariea Clonts in two  weeks and with cardiology on 5/17  2. Acute pain of both knees Improved, no longer on ultram. Continue scheduled Tylenol.  3. Baker's cyst of knee, right Noted on CT scan. No longer having any pain or swelling.   4. Permanent atrial fibrillation ([place]) Rate is controlled with digoxin She is off Eliquis due to #1.  Will f/u with Dr. Lovena Le regarding resumption of anticoagulation. She continues to have some bruising and swelling in the left leg but it is improved from admission.   5. Essential hypertension Continue atenolol 25 mg qd. I have asked the nurse to have the clinic nurse in [place] monitor her bp and provide a log at her next cards apt. Some of the numbers at [facility] were done with the automatic and are vastly different from the manual readings.   6. Constipation Improved Continue Miralax and senokot s daily    Patient is being discharged with the following home health services:  PT   Patient is being discharged with the following durable medical equipment:  NA  Patient has been advised to f/u with their PCP in 1-2 weeks to for a transitions of care visit.  Social services at their facility was responsible for arranging this appointment.  Pt was provided with adequate prescriptions of noncontrolled medications to reach the scheduled appointment .  For controlled substances, a limited supply was provided as appropriate for the individual patient.  If the pt normally receives these medications from a pain clinic or has a contract with another physician, these medications should be received from that clinic or physician only).    Future labs/tests needed:  F/U CBC in the future with Dr. Mariea Clonts

## 2020-03-16 DIAGNOSIS — M25662 Stiffness of left knee, not elsewhere classified: Secondary | ICD-10-CM | POA: Diagnosis not present

## 2020-03-16 DIAGNOSIS — R2689 Other abnormalities of gait and mobility: Secondary | ICD-10-CM | POA: Diagnosis not present

## 2020-03-16 DIAGNOSIS — M25562 Pain in left knee: Secondary | ICD-10-CM | POA: Diagnosis not present

## 2020-03-16 DIAGNOSIS — R278 Other lack of coordination: Secondary | ICD-10-CM | POA: Diagnosis not present

## 2020-03-16 DIAGNOSIS — M62562 Muscle wasting and atrophy, not elsewhere classified, left lower leg: Secondary | ICD-10-CM | POA: Diagnosis not present

## 2020-03-16 DIAGNOSIS — M7122 Synovial cyst of popliteal space [Baker], left knee: Secondary | ICD-10-CM | POA: Diagnosis not present

## 2020-03-17 ENCOUNTER — Other Ambulatory Visit: Payer: Self-pay | Admitting: Internal Medicine

## 2020-03-18 ENCOUNTER — Telehealth: Payer: Self-pay | Admitting: Internal Medicine

## 2020-03-18 DIAGNOSIS — R278 Other lack of coordination: Secondary | ICD-10-CM | POA: Diagnosis not present

## 2020-03-18 DIAGNOSIS — M62562 Muscle wasting and atrophy, not elsewhere classified, left lower leg: Secondary | ICD-10-CM | POA: Diagnosis not present

## 2020-03-18 DIAGNOSIS — R2689 Other abnormalities of gait and mobility: Secondary | ICD-10-CM | POA: Diagnosis not present

## 2020-03-18 DIAGNOSIS — M25662 Stiffness of left knee, not elsewhere classified: Secondary | ICD-10-CM | POA: Diagnosis not present

## 2020-03-18 DIAGNOSIS — M25562 Pain in left knee: Secondary | ICD-10-CM | POA: Diagnosis not present

## 2020-03-18 DIAGNOSIS — M7122 Synovial cyst of popliteal space [Baker], left knee: Secondary | ICD-10-CM | POA: Diagnosis not present

## 2020-03-18 NOTE — Telephone Encounter (Signed)
Returned call to Pt.  Phone rang and rang-did not go to VM.  Please advise Pt-she should discuss this with AT at her appt on Mar 22, 2020.

## 2020-03-18 NOTE — Telephone Encounter (Signed)
New message   Pt c/o medication issue:  1. Name of Medication: eliquis   2. How are you currently taking this medication (dosage and times per day)?n/a  3. Are you having a reaction (difficulty breathing--STAT)? No   4. What is your medication issue? Patient wants to know if she should resume taking this medication. Please advise.

## 2020-03-19 ENCOUNTER — Telehealth: Payer: Self-pay | Admitting: Student

## 2020-03-19 NOTE — Telephone Encounter (Signed)
Patient called and wanted to make sure her Daughter would be allowed to come into the clinic with her for her appointment on Monday. Dr. Lovena Le has encouraged her daughter to be present in the past to be an additional set of ears. Please let the patient know what the office decides.  The patient also called yesterday about whether or not she needs to be taking Eliquis but never got a follow up call yesterday. Please address this issue as well

## 2020-03-19 NOTE — Telephone Encounter (Signed)
I tried calling patient and telephone rang continuously with no answer. 5/14

## 2020-03-19 NOTE — Telephone Encounter (Signed)
I spoke with patient in regards to her daughter accompanying her to the visit because of "hearing" issues.  I told her that I would submit in the notes.

## 2020-03-19 NOTE — Telephone Encounter (Signed)
Attempted phone call to pt.  Phone line is busy.

## 2020-03-22 ENCOUNTER — Ambulatory Visit (INDEPENDENT_AMBULATORY_CARE_PROVIDER_SITE_OTHER): Payer: Medicare Other | Admitting: Student

## 2020-03-22 ENCOUNTER — Other Ambulatory Visit: Payer: Self-pay

## 2020-03-22 VITALS — BP 142/82 | HR 82 | Ht 66.0 in | Wt 129.0 lb

## 2020-03-22 DIAGNOSIS — Z950 Presence of cardiac pacemaker: Secondary | ICD-10-CM | POA: Diagnosis not present

## 2020-03-22 DIAGNOSIS — I442 Atrioventricular block, complete: Secondary | ICD-10-CM | POA: Diagnosis not present

## 2020-03-22 DIAGNOSIS — I4821 Permanent atrial fibrillation: Secondary | ICD-10-CM | POA: Diagnosis not present

## 2020-03-22 DIAGNOSIS — I10 Essential (primary) hypertension: Secondary | ICD-10-CM

## 2020-03-22 DIAGNOSIS — I2510 Atherosclerotic heart disease of native coronary artery without angina pectoris: Secondary | ICD-10-CM

## 2020-03-22 DIAGNOSIS — M7989 Other specified soft tissue disorders: Secondary | ICD-10-CM

## 2020-03-22 LAB — CUP PACEART INCLINIC DEVICE CHECK
Battery Remaining Longevity: 130 mo
Battery Voltage: 3.02 V
Brady Statistic RV Percent Paced: 0.97 %
Date Time Interrogation Session: 20210517140300
Implantable Lead Implant Date: 20200720
Implantable Lead Location: 753860
Implantable Pulse Generator Implant Date: 20200720
Lead Channel Impedance Value: 550 Ohm
Lead Channel Pacing Threshold Amplitude: 1 V
Lead Channel Pacing Threshold Amplitude: 1 V
Lead Channel Pacing Threshold Pulse Width: 0.5 ms
Lead Channel Pacing Threshold Pulse Width: 0.5 ms
Lead Channel Sensing Intrinsic Amplitude: 9.5 mV
Lead Channel Setting Pacing Amplitude: 2.5 V
Lead Channel Setting Pacing Pulse Width: 0.5 ms
Lead Channel Setting Sensing Sensitivity: 2 mV
Pulse Gen Model: 1272
Pulse Gen Serial Number: 9122450

## 2020-03-22 MED ORDER — ATENOLOL 50 MG PO TABS: 50.0000 mg | ORAL_TABLET | Freq: Every day | ORAL | 3 refills | Status: DC

## 2020-03-22 NOTE — Patient Instructions (Addendum)
Medication Instructions:  INCREASE ATENOLOL TO 50 mg Daily *If you need a refill on your cardiac medications before your next appointment, please call your pharmacy*   Lab Work: none If you have labs (blood work) drawn today and your tests are completely normal, you will receive your results only by: Marland Kitchen MyChart Message (if you have MyChart) OR . A paper copy in the mail If you have any lab test that is abnormal or we need to change your treatment, we will call you to review the results.   Testing/Procedures: none   Follow-Up: At [facility], you and your health needs are our priority.  As part of our continuing mission to provide you with exceptional heart care, we have created designated Provider Care Teams.  These Care Teams include your primary Cardiologist (physician) and Advanced Practice Providers (APPs -  Physician Assistants and Nurse Practitioners) who all work together to provide you with the care you need, when you need it.  We recommend signing up for the patient portal called "MyChart".  Sign up information is provided on this After Visit Summary.  MyChart is used to connect with patients for Virtual Visits (Telemedicine).  Patients are able to view lab/test results, encounter notes, upcoming appointments, etc.  Non-urgent messages can be sent to your provider as well.   To learn more about what you can do with MyChart, go to [email or web address].    Your next appointment:   3 months  The format for your next appointment:   In Person  Provider:   Dr Lovena Le   Other Instructions Remote monitoring is used to monitor your Pacemaker from home. This monitoring reduces the number of office visits required to check your device to one time per year. It allows [place] to keep an eye on the functioning of your device to ensure it is working properly. You are scheduled for a device check from home on 05/27/20. You may send your transmission at any time that day. If you have a  wireless device, the transmission will be sent automatically. After your physician reviews your transmission, you will receive a postcard with your next transmission date.

## 2020-03-22 NOTE — Progress Notes (Signed)
Electrophysiology Office Note Date: 03/22/2020  ID:  [name], DOB 1929-02-05, MRN [id number]  PCP: Conway Curry, DO Primary Cardiologist: No primary care provider on file. Electrophysiologist: Conway Peru, MD  CC: Pacemaker follow-up  Conway Conway is a 86 y.o. female seen today for Conway Peru, MD for routine electrophysiology followup.  Since last being seen in our clinic the patient reports doing OK. She recently had a bakers cyst that spontaneously rupture on Conway Conway. She is still working with PT and continues to have swelling in her left leg. She has been off of Conway Conway since 03/04/2020. She has also had elevated BP on atenelol 25 mg daily. she denies chest pain, palpitations, dyspnea, PND, orthopnea, nausea, vomiting, dizziness, syncope, weight gain, or early satiety.  Device History: St. Jude Single Chamber PPM implanted 05/2019 for syncope, tachy-brady Loop recorder 05/2018, removed 05/2019  Past Medical History:  Diagnosis Date  . Actinic keratosis 05/25/2014  . Anxiety   . Atrial fibrillation ([place]) 09/21/2010  . Basal cell carcinoma 05/25/2014   Multiple removed by Dr. Danella Sensing in March 2015: right neck, left neck, scalp   . Bell's palsy 07/18/1979  . Cervicalgia 01/31/2012  . Closed fracture of lumbar vertebra without mention of spinal cord injury 07/17/2005  . Conjunctiva disorder 12/26/2010  . Coronary atherosclerosis of native coronary artery 07/17/1997  . Cramp of limb 08/16/2009  . Degeneration of lumbar or lumbosacral intervertebral disc 01/31/2012  . Disturbance of skin sensation 08/2009  . Dizziness and giddiness 11/08/2011   vertigo  . External hemorrhoids without mention of complication 123XX123  . External hemorrhoids without mention of complication A999333  . Ganglion of tendon sheath 08/16/2009  . Leg cramp 10/27/2013   Most frequently the right leg.   . Long term (current) use of anticoagulants 09/2010  . Lumbago 07/2009  . Meralgia paresthetica  07/2007  . MI, old   . Myalgia and myositis, unspecified 01/17/2012  . Osteoporosis   . Other abnormal blood chemistry 04/08/2012  . Other abnormal blood chemistry 2013   hyperglycemia  . Other disorder of muscle, ligament, and fascia 04/08/2012  . Other specified cardiac dysrhythmias(427.89) 06/27/2010  . Pain in joint, ankle and foot 10/27/2013   Bilateral since 1998   . Pain in joint, shoulder region 08/12/2012  . Pain in joint, upper arm 12/26/2010  . Pain in limb 01/11/2011  . Pain in thoracic spine 01/31/2012  . 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 PVC's (premature ventricular contractions)   . Rash and other nonspecific skin eruption 08/02/2011  . Senile osteoporosis 07/17/1993  . Unspecified essential hypertension 07/17/1997  . Varicose veins of lower extremities 08/16/2009  . Varicose veins of lower extremities 08/16/2009   Past Surgical History:  Procedure Laterality Date  . COLONOSCOPY WITH PROPOFOL N/A 10/19/2015   Procedure: COLONOSCOPY WITH PROPOFOL;  Surgeon: Gatha Mayer, MD;  Location: [facility];  Service: Endoscopy;  Laterality: N/A;  . CORONARY ARTERY BYPASS GRAFT  1998   x2; LIMA to LAD; SVG to diagonal off bypass  . HEMORRHOID SURGERY  08/26/2012   Dr. Brantley Stage  . LOOP RECORDER INSERTION N/A 05/13/2018   Procedure: LOOP RECORDER INSERTION;  Surgeon: [name], MD;  Location: [place] [facility];  Service: Cardiovascular;  Laterality: N/A;  . LOOP RECORDER REMOVAL N/A 05/26/2019   Procedure: LOOP RECORDER REMOVAL;  Surgeon: [name], MD;  Location: [place] [facility];  Service: Cardiovascular;  Laterality: N/A;  . MASS EXCISION Left  11/23/2015   Procedure: LEFT WRIST EXCISION CYST;  Surgeon: Leanora Cover, MD;  Location: [place];  Service: Orthopedics;  Laterality: Left;  . PACEMAKER IMPLANT N/A 05/26/2019   Procedure: PACEMAKER IMPLANT;  Surgeon: [name], MD;  Location: [place] [facility];  Service: Cardiovascular;  Laterality:  N/A;  . SKIN BIOPSY  01/29/14   (R) neck; (R) scalp, 2 (L) neck; shave biopsy Superficial basal cell carcinoma Dr. Danella Sensing  . TONSILLECTOMY  1940's    Current Outpatient Medications  Medication Sig Dispense Refill  . acetaminophen (TYLENOL) 500 MG tablet Take 1,000 mg by mouth in the morning, at noon, and at bedtime.     . calcium-vitamin D (OSCAL WITH D) 500-200 MG-UNIT tablet Take 1 tablet by mouth daily.    . digoxin (LANOXIN) 0.125 MG tablet TAKE 1 TABLET EVERY OTHER  DAY 45 tablet 2  . LORazepam (ATIVAN) 1 MG tablet TAKE 1 TABLET AT BEDTIME TO DECREASE ANXIETY AND HELP WITH SLEEP (Patient taking differently: 0.5 mg at bedtime. ) 90 tablet 0  . Melatonin 3 MG CAPS Take 3 mg by mouth at bedtime.    . polyethylene glycol (MIRALAX / GLYCOLAX) 17 g packet Take 17 g by mouth daily.    Marland Kitchen senna-docusate (SENOKOT-S) 8.6-50 MG tablet Take 2 tablets by mouth daily.    Marland Kitchen atenolol (TENORMIN) 50 MG tablet Take 1 tablet (50 mg total) by mouth daily. 90 tablet 3   No current facility-administered medications for this visit.    Allergies:   Iodinated diagnostic agents, Bactrim, Relafen [nabumetone], and Sulfanilamide   Social History: Social History   Socioeconomic History  . Marital status: Widowed    Spouse name: Not on file  . Number of children: Not on file  . Years of education: Not on file  . Highest education level: Not on file  Occupational History  . Occupation: Retired Pharmacist, hospital  Tobacco Use  . Smoking status: Never Smoker  . Smokeless tobacco: Never Used  Substance and Sexual Activity  . Alcohol use: Yes    Comment: occasional wine  . Drug use: No  . Sexual activity: Never  Other Topics Concern  . Not on file  Social History Narrative   Living at [facility] since [facility]   Widowed 09/03/2016   Never smoked   Alcohol occasional wine   Exercise, house work    DNR , POA, Living Will   Social Determinants of Health   Financial Resource Strain:   . Difficulty of Paying  Living Expenses:   Food Insecurity:   . Worried About Charity fundraiser in the Last Year:   . Arboriculturist in the Last Year:   Transportation Needs:   . Film/video editor (Medical):   Marland Kitchen Lack of Transportation (Non-Medical):   Physical Activity:   . Days of Exercise per Week:   . Minutes of Exercise per Session:   Stress:   . Feeling of Stress :   Social Connections:   . Frequency of Communication with Friends and Family:   . Frequency of Social Gatherings with Friends and Family:   . Attends Religious Services:   . Active Member of Clubs or Organizations:   . Attends Archivist Meetings:   Marland Kitchen Marital Status:   Intimate Partner Violence:   . Fear of Current or Ex-Partner:   . Emotionally Abused:   Marland Kitchen Physically Abused:   . Sexually Abused:     Family History: Family History  Problem Relation Age of Onset  . Heart attack Father 67  . Heart disease Father   . Hypertension Sister   . Heart disease Sister   . Heart disease Brother      Review of Systems: All other systems reviewed and are otherwise negative except as noted above.  Physical Exam: Vitals:   03/22/20 1250  BP: (!) 142/82  Pulse: 82  Weight: 129 lb (58.5 kg)  Height: 5\' 6"  (1.676 m)     GEN- The patient is well appearing, alert and oriented x 3 today.   HEENT: normocephalic, atraumatic; sclera clear, conjunctiva pink; hearing intact; oropharynx clear; neck supple  Lungs- Clear to ausculation bilaterally, normal work of breathing.  No wheezes, rales, rhonchi Heart- Regular rate and rhythm, no murmurs, rubs or gallops  GI- soft, non-tender, non-distended, bowel sounds present  Extremities- no clubbing, cyanosis, or edema  MS- no significant deformity or atrophy Skin- warm and dry, no rash or lesion; PPM pocket well healed Psych- euthymic mood, full affect Neuro- strength and sensation are intact  PPM Interrogation- reviewed in detail today,  See PACEART report  EKG:  EKG is ordered  today. The ekg ordered today shows AF with controlled rates at 82 bpm.  Recent Labs: 03/04/2020: ALT 15; BUN 18; Creatinine 0.9; Hemoglobin 10.5; Platelets 163; Potassium 4.8; Sodium 133   Wt Readings from Last 3 Encounters:  03/22/20 129 lb (58.5 kg)  03/02/20 136 lb 9.6 oz (62 kg)  02/25/20 136 lb (61.7 kg)     Other studies Reviewed: Additional studies/ records that were reviewed today include: Previous EP office notes, Previous remote checks, Most recent labwork.   Assessment and Plan:  1. Tachy-Brady syndrome s/p St. Jude PPM  Normal PPM function See Pace Art report No changes today  2. Persistent Atrial fibrillation She was previously on Conway Conway with CHA2DS2VASC of at least 6   Low dose (age and weight) Conway Conway was held starting 03/04/2020 due to spontaneous knee effusion   3. Bakers cyst left knee with spontaneous rupture Improving. Remains symptomatic and edematous. Working with PT.  This has not resolved completely, and will not resume Conway Conway at this point. Will discuss further with Dr. Lovena Le.   4. HTN Elevated today and on most recent measurements at facility.  Increase atenlol to 50 mg daily.   Current medicines are reviewed at length with the patient today.   The patient does not have concerns regarding her medicines.  The following changes were made today:  Increase atenolol to 50 mg daily.  Labs/ tests ordered today include:  Orders Placed This Encounter  Procedures  . CUP PACEART [place]  . EKG 12-Lead   Disposition:   Follow up with Dr. Lovena Le in 3 Months   Signed, Shirley Friar, PA-C  03/22/2020 2:10 PM  [facility] [place] [address] [phone] (office) [phone] (fax)

## 2020-03-23 DIAGNOSIS — M25562 Pain in left knee: Secondary | ICD-10-CM | POA: Diagnosis not present

## 2020-03-23 DIAGNOSIS — M62562 Muscle wasting and atrophy, not elsewhere classified, left lower leg: Secondary | ICD-10-CM | POA: Diagnosis not present

## 2020-03-23 DIAGNOSIS — R2689 Other abnormalities of gait and mobility: Secondary | ICD-10-CM | POA: Diagnosis not present

## 2020-03-23 DIAGNOSIS — M7122 Synovial cyst of popliteal space [Baker], left knee: Secondary | ICD-10-CM | POA: Diagnosis not present

## 2020-03-23 DIAGNOSIS — R278 Other lack of coordination: Secondary | ICD-10-CM | POA: Diagnosis not present

## 2020-03-23 DIAGNOSIS — M25662 Stiffness of left knee, not elsewhere classified: Secondary | ICD-10-CM | POA: Diagnosis not present

## 2020-03-31 ENCOUNTER — Telehealth: Payer: Self-pay | Admitting: Internal Medicine

## 2020-03-31 NOTE — Telephone Encounter (Signed)
Did she say how her knee was doing?  It was still very large, bruised and edematous when I saw her her/it last week.   I intended to talk to him when he returns.   Her stroke risk is moderately high, so would like her to try and go back on once safe to do so from a bleeding perspective.  I will reach out to scheduling to see if she can come into office next week for reassessment, and will add picture to her chart/discu ss with him then.  Thanks!

## 2020-03-31 NOTE — Telephone Encounter (Signed)
Pt c/o medication issue:  1. Name of Medication: eliquis 2.5 mg  2. How are you currently taking this medication (dosage and times per day)? Was taking it twice a day  3. Are you having a reaction (difficulty breathing--STAT)? no  4. What is your medication issue? Patient states she has been off the medication for about a month, because she had a baker's cyst. She would like to know if she should start the medication again.

## 2020-04-06 ENCOUNTER — Ambulatory Visit (INDEPENDENT_AMBULATORY_CARE_PROVIDER_SITE_OTHER): Payer: Medicare Other | Admitting: Student

## 2020-04-06 ENCOUNTER — Other Ambulatory Visit: Payer: Self-pay

## 2020-04-06 ENCOUNTER — Encounter: Payer: Self-pay | Admitting: Student

## 2020-04-06 VITALS — BP 142/84 | HR 86 | Ht 66.0 in | Wt 133.8 lb

## 2020-04-06 DIAGNOSIS — M7989 Other specified soft tissue disorders: Secondary | ICD-10-CM

## 2020-04-06 DIAGNOSIS — I4821 Permanent atrial fibrillation: Secondary | ICD-10-CM | POA: Diagnosis not present

## 2020-04-06 DIAGNOSIS — I10 Essential (primary) hypertension: Secondary | ICD-10-CM | POA: Diagnosis not present

## 2020-04-06 DIAGNOSIS — I442 Atrioventricular block, complete: Secondary | ICD-10-CM | POA: Diagnosis not present

## 2020-04-06 DIAGNOSIS — I2510 Atherosclerotic heart disease of native coronary artery without angina pectoris: Secondary | ICD-10-CM | POA: Diagnosis not present

## 2020-04-06 NOTE — Patient Instructions (Addendum)
Medication Instructions:  STAY OFF ELIQUIS FOR NOW *If you need a refill on your cardiac medications before your next appointment, please call your pharmacy*   Lab Work: none If you have labs (blood work) drawn today and your tests are completely normal, you will receive your results only by: Marland Kitchen MyChart Message (if you have MyChart) OR . A paper copy in the mail If you have any lab test that is abnormal or we need to change your treatment, we will call you to review the results.   Testing/Procedures: none   Follow-Up: At [facility], you and your health needs are our priority.  As part of our continuing mission to provide you with exceptional heart care, we have created designated Provider Care Teams.  These Care Teams include your primary Cardiologist (physician) and Advanced Practice Providers (APPs -  Physician Assistants and Nurse Practitioners) who all work together to provide you with the care you need, when you need it.  We recommend signing up for the patient portal called "MyChart".  Sign up information is provided on this After Visit Summary.  MyChart is used to connect with patients for Virtual Visits (Telemedicine).  Patients are able to view lab/test results, encounter notes, upcoming appointments, etc.  Non-urgent messages can be sent to your provider as well.   To learn more about what you can do with MyChart, go to [email or web address].      Other Instructions Remote monitoring is used to monitor your Pacemaker from home. This monitoring reduces the number of office visits required to check your device to one time per year. It allows [place] to keep an eye on the functioning of your device to ensure it is working properly. You are scheduled for a device check from home on 05/27/20. You may send your transmission at any time that day. If you have a wireless device, the transmission will be sent automatically. After your physician reviews your transmission, you will receive a  postcard with your next transmission date.  CHECK BP Daily and bring results to next appointment

## 2020-04-06 NOTE — Progress Notes (Signed)
Electrophysiology Office Note Date: 04/06/2020  ID:  Susan Conway, DOB August 10, 1929, MRN [id number]  PCP: [name], DO Primary Cardiologist: No primary care provider on file. Electrophysiologist: Cristopher Peru, MD   CC: Pacemaker follow-up  Susan Conway is a 86 y.o. female seen today for Cristopher Peru, MD for routine electrophysiology followup.  Since last being seen in our clinic the patient reports improvement. She has finished physical therapy for her leg.  she denies chest pain, palpitations, dyspnea, PND, orthopnea, nausea, vomiting, dizziness, syncope, edema, weight gain, or early satiety.  Device History: St. Jude Dual Chamber PPM implanted 05/2019 for tachy-brady MDT Loop recorder implanted 05/2018, extracted 05/2019  Past Medical History:  Diagnosis Date  . Actinic keratosis 05/25/2014  . Anxiety   . Atrial fibrillation ([place]) 09/21/2010  . Basal cell carcinoma 05/25/2014   Multiple removed by Dr. Danella Sensing in March 2015: right neck, left neck, scalp   . Bell's palsy 07/18/1979  . Cervicalgia 01/31/2012  . Closed fracture of lumbar vertebra without mention of spinal cord injury 07/17/2005  . Conjunctiva disorder 12/26/2010  . Coronary atherosclerosis of native coronary artery 07/17/1997  . Cramp of limb 08/16/2009  . Degeneration of lumbar or lumbosacral intervertebral disc 01/31/2012  . Disturbance of skin sensation 08/2009  . Dizziness and giddiness 11/08/2011   vertigo  . External hemorrhoids without mention of complication 123XX123  . External hemorrhoids without mention of complication A999333  . Ganglion of tendon sheath 08/16/2009  . Leg cramp 10/27/2013   Most frequently the right leg.   . Long term (current) use of anticoagulants 09/2010  . Lumbago 07/2009  . Meralgia paresthetica 07/2007  . MI, old   . Myalgia and myositis, unspecified 01/17/2012  . Osteoporosis   . Other abnormal blood chemistry 04/08/2012  . Other abnormal blood chemistry 2013   hyperglycemia  . Other disorder of muscle, ligament, and fascia 04/08/2012  . Other specified cardiac dysrhythmias(427.89) 06/27/2010  . Pain in joint, ankle and foot 10/27/2013   Bilateral since 1998   . Pain in joint, shoulder region 08/12/2012  . Pain in joint, upper arm 12/26/2010  . Pain in limb 01/11/2011  . Pain in thoracic spine 01/31/2012  . 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 PVC's (premature ventricular contractions)   . Rash and other nonspecific skin eruption 08/02/2011  . Senile osteoporosis 07/17/1993  . Unspecified essential hypertension 07/17/1997  . Varicose veins of lower extremities 08/16/2009  . Varicose veins of lower extremities 08/16/2009   Past Surgical History:  Procedure Laterality Date  . COLONOSCOPY WITH PROPOFOL N/A 10/19/2015   Procedure: COLONOSCOPY WITH PROPOFOL;  Surgeon: Gatha Mayer, MD;  [facility];  Service: Endoscopy;  Laterality: N/A;  . CORONARY ARTERY BYPASS GRAFT  1998   x2; LIMA to LAD; SVG to diagonal off bypass  . HEMORRHOID SURGERY  08/26/2012   Dr. Brantley Stage  . LOOP RECORDER INSERTION N/A 05/13/2018   Procedure: LOOP RECORDER INSERTION;  Surgeon: [name], MD;  Location: [place] [facility];  Service: Cardiovascular;  Laterality: N/A;  . LOOP RECORDER REMOVAL N/A 05/26/2019   Procedure: LOOP RECORDER REMOVAL;  Surgeon: [name], MD;  Location: [place] [facility];  Service: Cardiovascular;  Laterality: N/A;  . MASS EXCISION Left 11/23/2015   Procedure: LEFT WRIST EXCISION CYST;  Surgeon: Leanora Cover, MD;  Location: [place];  Service: Orthopedics;  Laterality: Left;  . PACEMAKER IMPLANT N/A 05/26/2019   Procedure: PACEMAKER  IMPLANT;  Surgeon: [name], MD;  Location: [place] [facility];  Service: Cardiovascular;  Laterality: N/A;  . SKIN BIOPSY  01/29/14   (R) neck; (R) scalp, 2 (L) neck; shave biopsy Superficial basal cell carcinoma Dr. Danella Sensing  . TONSILLECTOMY  1940's    Current  Outpatient Medications  Medication Sig Dispense Refill  . acetaminophen (TYLENOL) 500 MG tablet Take 1,000 mg by mouth in the morning, at noon, and at bedtime.     Marland Kitchen atenolol (TENORMIN) 50 MG tablet Take 1 tablet (50 mg total) by mouth daily. 90 tablet 3  . calcium-vitamin D (OSCAL WITH D) 500-200 MG-UNIT tablet Take 1 tablet by mouth daily.    . digoxin (LANOXIN) 0.125 MG tablet TAKE 1 TABLET EVERY OTHER  DAY 45 tablet 2  . LORazepam (ATIVAN) 1 MG tablet TAKE 1 TABLET AT BEDTIME TO DECREASE ANXIETY AND HELP WITH SLEEP 90 tablet 0  . Melatonin 3 MG CAPS Take 3 mg by mouth at bedtime.    . polyethylene glycol (MIRALAX / GLYCOLAX) 17 g packet Take 17 g by mouth daily.    Marland Kitchen senna-docusate (SENOKOT-S) 8.6-50 MG tablet Take 2 tablets by mouth daily.     No current facility-administered medications for this visit.    Allergies:   Iodinated diagnostic agents, Bactrim, Relafen [nabumetone], and Sulfanilamide   Social History: Social History   Socioeconomic History  . Marital status: Widowed    Spouse name: Not on file  . Number of children: Not on file  . Years of education: Not on file  . Highest education level: Not on file  Occupational History  . Occupation: Retired Pharmacist, hospital  Tobacco Use  . Smoking status: Never Smoker  . Smokeless tobacco: Never Used  Substance and Sexual Activity  . Alcohol use: Yes    Comment: occasional wine  . Drug use: No  . Sexual activity: Never  Other Topics Concern  . Not on file  Social History Narrative   Living at [facility] since 2010   Widowed 09/03/2016   Never smoked   Alcohol occasional wine   Exercise, house work    DNR , POA, Living Will   Social Determinants of Health   Financial Resource Strain:   . Difficulty of Paying Living Expenses:   Food Insecurity:   . Worried About Charity fundraiser in the Last Year:   . Arboriculturist in the Last Year:   Transportation Needs:   . Film/video editor (Medical):   Marland Kitchen Lack of  Transportation (Non-Medical):   Physical Activity:   . Days of Exercise per Week:   . Minutes of Exercise per Session:   Stress:   . Feeling of Stress :   Social Connections:   . Frequency of Communication with Friends and Family:   . Frequency of Social Gatherings with Friends and Family:   . Attends Religious Services:   . Active Member of Clubs or Organizations:   . Attends Archivist Meetings:   Marland Kitchen Marital Status:   Intimate Partner Violence:   . Fear of Current or Ex-Partner:   . Emotionally Abused:   Marland Kitchen Physically Abused:   . Sexually Abused:     Family History: Family History  Problem Relation Age of Onset  . Heart attack Father 88  . Heart disease Father   . Hypertension Sister   . Heart disease Sister   . Heart disease Brother     Review of Systems: All other  systems reviewed and are otherwise negative except as noted above.  Physical Exam: Vitals:   04/06/20 1127  BP: (!) 142/84  Pulse: 86  SpO2: 99%  Weight: 133 lb 12.8 oz (60.7 kg)  Height: 5\' 6"  (1.676 m)     GEN- The patient is well appearing, alert and oriented x 3 today.   HEENT: normocephalic, atraumatic; sclera clear, conjunctiva pink; hearing intact; oropharynx clear; neck supple  Lungs- Clear to ausculation bilaterally, normal work of breathing.  No wheezes, rales, rhonchi Heart- Regular rate and rhythm, no murmurs, rubs or gallops  GI- soft, non-tender, non-distended, bowel sounds present  Extremities- no clubbing or cyanosis. Left knee with mild edema from calf down.  MS- no significant deformity or atrophy Skin- warm and dry, no rash or lesion; PPM pocket well healed Psych- euthymic mood, full affect Neuro- strength and sensation are intact  PPM Interrogation- Not performed today. See PACEART report from 03/22/2020  EKG:  EKG is not ordered today.  Recent Labs: 03/04/2020: ALT 15; BUN 18; Creatinine 0.9; Hemoglobin 10.5; Platelets 163; Potassium 4.8; Sodium 133   Wt Readings  from Last 3 Encounters:  04/06/20 133 lb 12.8 oz (60.7 kg)  03/22/20 129 lb (58.5 kg)  03/02/20 136 lb 9.6 oz (62 kg)     Other studies Reviewed: Additional studies/ records that were reviewed today include: Previous EP office notes, Previous remote checks, Most recent labwork.   Assessment and Plan:  1. Tachy-Brady syndrome s/p St. Jude PPM  Normal PPM function See Pace Art report No changes today  2. Persistent Atrial fibrillation Eliquis currently on hold with #3. Per Dr. Lovena Le, will hold for at least 3 months. I will have her follow up with him at that time. This has been held since 4/29 Her CHA2DS2VASC is at least 6    3. Bakers cyst left knee with spontaneous rupture Continue to improve. She has finished PT. She still has some swelling but ecchymosis resolved mostly.   4. HTN Remains somewhat elevated. She wishes to continue to check at home and will bring records to her next visit.   Current medicines are reviewed at length with the patient today.   The patient does not have concerns regarding her medicines.  The following changes were made today:  none   Disposition:   Follow up with Dr. Lovena Le in 2 months to consider resuming Eliquis.   Jacalyn Lefevre, PA-C  04/06/2020 2:01 PM  [facility] [address] [place] [address] [phone] (office) [phone] (fax)

## 2020-04-27 ENCOUNTER — Other Ambulatory Visit: Payer: Self-pay | Admitting: Internal Medicine

## 2020-04-27 NOTE — Telephone Encounter (Signed)
This is Dr Mariea Clonts Patient

## 2020-04-29 ENCOUNTER — Telehealth: Payer: Self-pay | Admitting: Internal Medicine

## 2020-04-29 NOTE — Telephone Encounter (Signed)
Returned call to Pt.  Advised Dr. Lovena Le off this week, but would have him review upon return if any further htn medications needed.

## 2020-04-29 NOTE — Telephone Encounter (Signed)
Pt c/o BP issue: STAT if pt c/o blurred vision, one-sided weakness or slurred speech  1. What are your last 5 BP readings? 6/22 morning 169/80 HR 94, night 145/87 HR 90, 6/23 morning 159/89 HR 68, night 141/75 HR 75, this morning 153/96 HR 86  2. Are you having any other symptoms (ex. Dizziness, headache, blurred vision, passed out)? no  3. What is your BP issue? High BP

## 2020-05-05 MED ORDER — ATENOLOL 50 MG PO TABS
ORAL_TABLET | ORAL | 3 refills | Status: DC
Start: 2020-05-05 — End: 2020-06-16

## 2020-05-05 NOTE — Telephone Encounter (Signed)
Call returned to Susan Conway.  Advised per Dr. Lovena Le to increase atenolol to 50 mg AM 25 mg PM  Susan Conway indicates understanding.  She will keep log of blood pressures and notify office if they remain high.

## 2020-05-06 DIAGNOSIS — Z950 Presence of cardiac pacemaker: Secondary | ICD-10-CM

## 2020-05-06 HISTORY — DX: Presence of cardiac pacemaker: Z95.0

## 2020-05-14 ENCOUNTER — Other Ambulatory Visit: Payer: Self-pay | Admitting: Internal Medicine

## 2020-05-27 ENCOUNTER — Ambulatory Visit (INDEPENDENT_AMBULATORY_CARE_PROVIDER_SITE_OTHER): Payer: Medicare Other | Admitting: *Deleted

## 2020-05-27 DIAGNOSIS — I472 Ventricular tachycardia, unspecified: Secondary | ICD-10-CM

## 2020-05-27 LAB — CUP PACEART REMOTE DEVICE CHECK
Battery Remaining Longevity: 131 mo
Battery Remaining Percentage: 95.5 %
Battery Voltage: 3.02 V
Brady Statistic RV Percent Paced: 1.4 %
Date Time Interrogation Session: 20210722040012
Implantable Lead Implant Date: 20200720
Implantable Lead Location: 753860
Implantable Pulse Generator Implant Date: 20200720
Lead Channel Impedance Value: 580 Ohm
Lead Channel Pacing Threshold Amplitude: 1 V
Lead Channel Pacing Threshold Pulse Width: 0.5 ms
Lead Channel Sensing Intrinsic Amplitude: 9 mV
Lead Channel Setting Pacing Amplitude: 2.5 V
Lead Channel Setting Pacing Pulse Width: 0.5 ms
Lead Channel Setting Sensing Sensitivity: 2 mV
Pulse Gen Model: 1272
Pulse Gen Serial Number: 9122450

## 2020-05-31 NOTE — Progress Notes (Signed)
Remote pacemaker transmission.   

## 2020-06-16 ENCOUNTER — Telehealth: Payer: Self-pay | Admitting: Internal Medicine

## 2020-06-16 MED ORDER — ATENOLOL 50 MG PO TABS: ORAL_TABLET | ORAL | 3 refills | Status: DC

## 2020-06-16 MED ORDER — ATENOLOL 50 MG PO TABS: ORAL_TABLET | ORAL | 0 refills | Status: DC

## 2020-06-16 NOTE — Telephone Encounter (Signed)
*  STAT* If patient is at the pharmacy, call can be transferred to refill team.   1. Which medications need to be refilled? (please list name of each medication and dose if known) atenolol (TENORMIN) 50 MG tablet  2. Which pharmacy/location (including street and city if local pharmacy) is medication to be sent to?  [facility] [place] [address] AT Portal to Registered [facility] Sites  3. Do they need a 30 day or 90 day supply? 90; (sent 30 day supply to local pharmacy due to pt being almost out of medication.

## 2020-06-16 NOTE — Telephone Encounter (Signed)
*  STAT* If patient is at the pharmacy, call can be transferred to refill team.   1. Which medications need to be refilled? (please list name of each medication and dose if known) atenolol (TENORMIN) 50 MG tablet   2. Which pharmacy/location (including street and city if local pharmacy) is medication to be sent to? [id number] [place] [place] [facility] [address] [address]  3. Do they need a 30 day or 90 day supply? [place]

## 2020-06-16 NOTE — Telephone Encounter (Signed)
Pt's medication was resent in for a 30 day supply until mail order arrives. Confirmation received.

## 2020-06-16 NOTE — Telephone Encounter (Signed)
Pt's medication was sent to pt's pharmacy as requested. Confirmation received.  °

## 2020-06-18 ENCOUNTER — Telehealth: Payer: Self-pay | Admitting: Internal Medicine

## 2020-06-18 NOTE — Telephone Encounter (Signed)
Pt c/o medication issue:  1. Name of Medication: atenolol (TENORMIN) 50 MG tablet  2. How are you currently taking this medication (dosage and times per day)?   3. Are you having a reaction (difficulty breathing--STAT)? No   4. What is your medication issue? Faithlyn is calling stating [employer] reached out to her advising her refill request for this medication was denied. Janila is wanting to know what is going on with the medication due to this being a new prescription because of an increase. [facility]ill was [facility] to last her until the mail order is received and they advised it is to early for her prescription to be filled and covered by insurance. After advising them on the circumstance they stated they call fill the prescription to last her, but she would have to pay out of pocket. The pt is leaving today at 1:00 PM and will be back around 4:00PM so please call before 1 or after 4. Please advise.

## 2020-06-18 NOTE — Telephone Encounter (Signed)
I spoke with patient. We determined she had been taking atenolol 50 mg tablets in the morning and 25 mg tablets in the evening.  She called in a refill for 25 mg tablets and this was not able to be refilled. Patient located new prescription for 50 mg tablets with instructions to take one tablet in the AM and half in the PM.  She has a few of the 25 mg tablets left so will finish these and then discard this bottle.  She will then switch to current prescription. She is aware she will need to half these tablets. [facility] and ask tell them prescription sent on 8/11 not needed.

## 2020-06-25 ENCOUNTER — Ambulatory Visit (INDEPENDENT_AMBULATORY_CARE_PROVIDER_SITE_OTHER): Payer: Medicare Other | Admitting: Internal Medicine

## 2020-06-25 ENCOUNTER — Other Ambulatory Visit: Payer: Self-pay

## 2020-06-25 ENCOUNTER — Encounter: Payer: Self-pay | Admitting: Internal Medicine

## 2020-06-25 VITALS — BP 146/80 | HR 84 | Ht 66.0 in | Wt 132.6 lb

## 2020-06-25 DIAGNOSIS — I4821 Permanent atrial fibrillation: Secondary | ICD-10-CM | POA: Diagnosis not present

## 2020-06-25 DIAGNOSIS — I2510 Atherosclerotic heart disease of native coronary artery without angina pectoris: Secondary | ICD-10-CM | POA: Diagnosis not present

## 2020-06-25 MED ORDER — ATENOLOL 50 MG PO TABS
50.0000 mg | ORAL_TABLET | Freq: Two times a day (BID) | ORAL | 3 refills | Status: DC
Start: 2020-06-25 — End: 2020-08-23

## 2020-06-25 NOTE — Progress Notes (Signed)
HPI Susan Conway returns today for followup. SHe is a pleasant elderly woman with persistent atrial fib, s/p PPM insertion due to transient AV block. She has been bothered by a Bakers cyst on the left leg. No trauma. She has not had syncope in the time since her PPM has been inserted. Allergies  Allergen Reactions  . Iodinated Diagnostic Agents Nausea Only and Other (See Comments)    Severe nausea and also passed out  . Bactrim Rash  . Relafen [Nabumetone] Rash  . Sulfanilamide Rash     Current Outpatient Medications  Medication Sig Dispense Refill  . acetaminophen (TYLENOL) 500 MG tablet Take 1,000 mg by mouth in the morning, at noon, and at bedtime.     . calcium-vitamin D (OSCAL WITH D) 500-200 MG-UNIT tablet Take 1 tablet by mouth daily.    . digoxin (LANOXIN) 0.125 MG tablet TAKE 1 TABLET EVERY OTHER  DAY 45 tablet 2  . LORazepam (ATIVAN) 1 MG tablet TAKE 1 TABLET AT BEDTIME TO DECREASE ANXIETY AND HELP WITH SLEEP 90 tablet 0  . atenolol (TENORMIN) 50 MG tablet Take 1 tablet (50 mg total) by mouth 2 (two) times daily. 180 tablet 3   No current facility-administered medications for this visit.     Past Medical History:  Diagnosis Date  . Actinic keratosis 05/25/2014  . Anxiety   . Atrial fibrillation ([place]) 09/21/2010  . Basal cell carcinoma 05/25/2014   Multiple removed by Dr. Danella Sensing in March 2015: right neck, left neck, scalp   . Bell's palsy 07/18/1979  . Cervicalgia 01/31/2012  . Closed fracture of lumbar vertebra without mention of spinal cord injury 07/17/2005  . Conjunctiva disorder 12/26/2010  . Coronary atherosclerosis of native coronary artery 07/17/1997  . Cramp of limb 08/16/2009  . Degeneration of lumbar or lumbosacral intervertebral disc 01/31/2012  . Disturbance of skin sensation 08/2009  . Dizziness and giddiness 11/08/2011   vertigo  . External hemorrhoids without mention of complication 0/25/8527  . External hemorrhoids without mention of complication  78/24/2353  . Ganglion of tendon sheath 08/16/2009  . Leg cramp 10/27/2013   Most frequently the right leg.   . Long term (current) use of anticoagulants 09/2010  . Lumbago 07/2009  . Meralgia paresthetica 07/2007  . MI, old   . Myalgia and myositis, unspecified 01/17/2012  . Osteoporosis   . Other abnormal blood chemistry 04/08/2012  . Other abnormal blood chemistry 2013   hyperglycemia  . Other disorder of muscle, ligament, and fascia 04/08/2012  . Other specified cardiac dysrhythmias(427.89) 06/27/2010  . Pain in joint, ankle and foot 10/27/2013   Bilateral since 1998   . Pain in joint, shoulder region 08/12/2012  . Pain in joint, upper arm 12/26/2010  . Pain in limb 01/11/2011  . Pain in thoracic spine 01/31/2012  . 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 PVC's (premature ventricular contractions)   . Rash and other nonspecific skin eruption 08/02/2011  . Senile osteoporosis 07/17/1993  . Unspecified essential hypertension 07/17/1997  . Varicose veins of lower extremities 08/16/2009  . Varicose veins of lower extremities 08/16/2009    ROS:   All systems reviewed and negative except as noted in the HPI.   Past Surgical History:  Procedure Laterality Date  . COLONOSCOPY WITH PROPOFOL N/A 10/19/2015   Procedure: COLONOSCOPY WITH PROPOFOL;  Surgeon: Gatha Mayer, MD;  Location[facility];  Service: Endoscopy;  Laterality: N/A;  . [place]  x2; LIMA to LAD; SVG to diagonal off bypass  . HEMORRHOID SURGERY  08/26/2012   Dr. Brantley Stage  . LOOP RECORDER INSERTION N/A 05/13/2018   Procedure: LOOP RECORDER INSERTION;  Surgeon: [name], MD;  Location: [place] [facility];  Service: Cardiovascular;  Laterality: N/A;  . LOOP RECORDER REMOVAL N/A 05/26/2019   Procedure: LOOP RECORDER REMOVAL;  Surgeon: [name], MD;  Location: [place] [facility];  Service: Cardiovascular;  Laterality: N/A;  . MASS EXCISION Left 11/23/2015   Procedure: LEFT WRIST  EXCISION CYST;  Surgeon: Leanora Cover, MD;  Location: [facility];  Service: Orthopedics;  Laterality: Left;  . PACEMAKER IMPLANT N/A 05/26/2019   Procedure: PACEMAKER IMPLANT;  Surgeon: [name], MD;  Location: [place] [facility];  Service: Cardiovascular;  Laterality: N/A;  . SKIN BIOPSY  01/29/14   (R) neck; (R) scalp, 2 (L) neck; shave biopsy Superficial basal cell carcinoma Dr. Danella Sensing  . TONSILLECTOMY  1940's     Family History  Problem Relation Age of Onset  . Heart attack Father 63  . Heart disease Father   . Hypertension Sister   . Heart disease Sister   . Heart disease Brother      Social History   Socioeconomic History  . Marital status: Widowed    Spouse name: Not on file  . Number of children: Not on file  . Years of education: Not on file  . Highest education level: Not on file  Occupational History  . Occupation: Retired Pharmacist, hospital  Tobacco Use  . Smoking status: Never Smoker  . Smokeless tobacco: Never Used  Vaping Use  . Vaping Use: Never used  Substance and Sexual Activity  . Alcohol use: Yes    Comment: occasional wine  . Drug use: No  . Sexual activity: Never  Other Topics Concern  . Not on file  Social History Narrative   [facility] at [facility] since [facility]   Widowed 09/03/2016   Never smoked   Alcohol occasional wine   Exercise, house work    DNR , POA, Living Will   Social Determinants of Health   Financial Resource Strain:   . Difficulty of Paying Living Expenses: Not on file  Food Insecurity:   . Worried About Charity fundraiser in the Last Year: Not on file  . Ran Out of Food in the Last Year: Not on file  Transportation Needs:   . Lack of Transportation (Medical): Not on file  . Lack of Transportation (Non-Medical): Not on file  Physical Activity:   . Days of Exercise per Week: Not on file  . Minutes of Exercise per Session: Not on file  Stress:   . Feeling of Stress : Not on file  Social Connections:   .  Frequency of Communication with Friends and Family: Not on file  . Frequency of Social Gatherings with Friends and Family: Not on file  . Attends Religious Services: Not on file  . Active Member of Clubs or Organizations: Not on file  . Attends Archivist Meetings: Not on file  . Marital Status: Not on file  Intimate Partner Violence:   . Fear of Current or Ex-Partner: Not on file  . Emotionally Abused: Not on file  . Physically Abused: Not on file  . Sexually Abused: Not on file     BP (!) 146/80   Pulse 84   Ht 5\' 6"  (1.676 m)   Wt 132 lb 9.6  oz (60.1 kg)   SpO2 91%   BMI 21.40 kg/m   Physical Exam:  Well appearing elderly woman, NAD HEENT: Unremarkable Neck:  6 cm JVD, no thyromegally Lymphatics:  No adenopathy Back:  No CVA tenderness Lungs:  Clear with no wheezes HEART:  Regular rate rhythm, no murmurs, no rubs, no clicks Abd:  soft, positive bowel sounds, no organomegally, no rebound, no guarding Ext:  2 plus pulses, no edema, no cyanosis, no clubbing Skin:  No rashes no nodules Neuro:  CN II through XII intact, motor grossly intact  DEVICE  Normal device function.  See PaceArt for details.   Assess/Plan: 1. Persistent atrial fib - her VR is fairly well controlled.  2. HTN - a reviewed her bp's demonstrates an ave SBP of around 145. I have asked her to reduce her salt intake and she will increase atenolol to 50 bid.  3. PPM - her St. Jude VVI PM is working normally. We will recheck in several months. 4. CAD - she denies anginal symptoms. She will continue her current meds.  Salome Spotted.

## 2020-06-25 NOTE — Patient Instructions (Addendum)
Medication Instructions:  Your physician has recommended you make the following change in your medication:   1.  Increase your atenolol 50 mg-  Take one tablet by mouth twice a day  Labwork: None ordered.  Testing/Procedures: None ordered.  Follow-Up: Your physician wants you to follow-up in: one year with Dr. Lovena Le.   You will receive a reminder letter in the mail two months in advance. If you don't receive a letter, please call our office to schedule the follow-up appointment.  Remote monitoring is used to monitor your Pacemaker from home. This monitoring reduces the number of office visits required to check your device to one time per year. It allows [place] to keep an eye on the functioning of your device to ensure it is working properly. You are scheduled for a device check from home on 08/26/2020. You may send your transmission at any time that day. If you have a wireless device, the transmission will be sent automatically. After your physician reviews your transmission, you will receive a postcard with your next transmission date.  Any Other Special Instructions Will Be Listed Below (If Applicable).  If you need a refill on your cardiac medications before your next appointment, please call your pharmacy.

## 2020-06-30 DIAGNOSIS — Z85828 Personal history of other malignant neoplasm of skin: Secondary | ICD-10-CM | POA: Diagnosis not present

## 2020-06-30 DIAGNOSIS — C44722 Squamous cell carcinoma of skin of right lower limb, including hip: Secondary | ICD-10-CM | POA: Diagnosis not present

## 2020-06-30 DIAGNOSIS — D485 Neoplasm of uncertain behavior of skin: Secondary | ICD-10-CM | POA: Diagnosis not present

## 2020-07-12 LAB — COMPREHENSIVE METABOLIC PANEL: Calcium: 9.8 (ref 8.7–10.7)

## 2020-07-12 LAB — CBC: RBC: 4.28 (ref 3.87–5.11)

## 2020-07-22 DIAGNOSIS — I10 Essential (primary) hypertension: Secondary | ICD-10-CM | POA: Diagnosis not present

## 2020-07-22 DIAGNOSIS — E785 Hyperlipidemia, unspecified: Secondary | ICD-10-CM | POA: Diagnosis not present

## 2020-07-22 DIAGNOSIS — R739 Hyperglycemia, unspecified: Secondary | ICD-10-CM | POA: Diagnosis not present

## 2020-07-22 LAB — BASIC METABOLIC PANEL
BUN: 26 — AB (ref 4–21)
CO2: 29 — AB (ref 13–22)
Chloride: 104 (ref 99–108)
Creatinine: 1 (ref 0.5–1.1)
Glucose: 99
Potassium: 4.1 (ref 3.4–5.3)
Sodium: 142 (ref 137–147)

## 2020-07-22 LAB — CBC AND DIFFERENTIAL
HCT: 39 (ref 36–46)
Hemoglobin: 12.7 (ref 12.0–16.0)
Platelets: 87 — AB (ref 150–399)
WBC: 8.4

## 2020-07-22 LAB — LIPID PANEL
Cholesterol: 166 (ref 0–200)
HDL: 53 (ref 35–70)
Triglycerides: 54 (ref 40–160)

## 2020-07-23 ENCOUNTER — Encounter: Payer: Self-pay | Admitting: Internal Medicine

## 2020-07-23 ENCOUNTER — Telehealth: Payer: Self-pay

## 2020-07-23 NOTE — Telephone Encounter (Signed)
Bernadette from [facility] called and stated that Susan Conway is 17, I will abstract the labs she sent into the system for your review please advise?

## 2020-07-23 NOTE — Telephone Encounter (Signed)
Message given to [facility]

## 2020-07-23 NOTE — Telephone Encounter (Signed)
Will monitor.  We will discuss at her appointment.

## 2020-07-28 ENCOUNTER — Other Ambulatory Visit: Payer: Self-pay

## 2020-07-28 ENCOUNTER — Encounter: Payer: Self-pay | Admitting: Internal Medicine

## 2020-07-28 ENCOUNTER — Non-Acute Institutional Stay: Payer: Medicare Other | Admitting: Internal Medicine

## 2020-07-28 VITALS — BP 138/82 | HR 85 | Temp 97.0°F | Ht 66.0 in | Wt 133.2 lb

## 2020-07-28 DIAGNOSIS — D6869 Other thrombophilia: Secondary | ICD-10-CM

## 2020-07-28 DIAGNOSIS — I4891 Unspecified atrial fibrillation: Secondary | ICD-10-CM | POA: Diagnosis not present

## 2020-07-28 DIAGNOSIS — I2510 Atherosclerotic heart disease of native coronary artery without angina pectoris: Secondary | ICD-10-CM | POA: Diagnosis not present

## 2020-07-28 DIAGNOSIS — M7121 Synovial cyst of popliteal space [Baker], right knee: Secondary | ICD-10-CM | POA: Diagnosis not present

## 2020-07-28 DIAGNOSIS — D696 Thrombocytopenia, unspecified: Secondary | ICD-10-CM | POA: Diagnosis not present

## 2020-07-28 DIAGNOSIS — I4821 Permanent atrial fibrillation: Secondary | ICD-10-CM

## 2020-07-28 DIAGNOSIS — L98492 Non-pressure chronic ulcer of skin of other sites with fat layer exposed: Secondary | ICD-10-CM | POA: Diagnosis not present

## 2020-07-28 NOTE — Progress Notes (Signed)
Location:  Occupational psychologist of Service:  Clinic (12)  Provider: Tiquan Bouch L. Mariea Clonts, D.O., C.M.D.  Code Status: DNR Goals of Care:  Advanced Directives 07/28/2020  Does Patient Have a Medical Advance Directive? Yes  Type of Advance Directive Out of facility DNR (pink MOST or yellow form)  Does patient want to make changes to medical advance directive? No - Patient declined  Copy of [place] in Chart? -  Pre-existing out of facility DNR order (yellow form or pink MOST form) -     Chief Complaint  Patient presents with  . Medical Management of Chronic Issues    6 month follow up   . Acute Visit    bigger cyst in leg discuss other concerns she has written down.   Marland Kitchen Health Maintenance    Influenza(WS) T-Dap     HPI: Patient is a 86 y.o. female seen today for medical management of chronic diseases.    tdap has to be done at the pharmacy  Still has some swelling, tenderness of her left calf after the baker's cyst.    Both knees bothered her for several months after the baker's cyst episode.  They were stiff and not working like she'd like.  They would improve after getting up to move.  If she went to the bathroom, they would hurt. Discussed deconditioning after the cyst rehab admission.   She will consider voltaren gel if this recurs for her.    She then had a spot on her right leg.  She got a biopsy by Dr. Ronnald Ramp which came back negative--2-3 wks ago.  It's down near her ankle.  She returns in about 3 wks to see him again, but she went back sooner.    PA Tillery asked her to check her BP readings and they range from 130s to 168 max.  She showed them to Dr. Lovena Le and reports he did not recommend changes.  She is still off her anticoagulation after the hemorrhagic baker's cyst.  She is still bruising easily.  Platelets 87 on last labs.  She does not want to pursue evaluation.  She's had no more epistaxis since being off the anticoagulation.      Remaining labs were normal.    Every now and then, the right shoulder will hurt her fairly severely when she moves it.  She puts up with it or uses the warm compresses and it will feel better.  Then no problem for months.    She's realizing she's not "57" and cannot do as much like she once did.    She is not having concerns about her memory.  There had been some during her stay while in rehab for her baker's cyst and taking pain meds.  Past Medical History:  Diagnosis Date  . Actinic keratosis 05/25/2014  . Anxiety   . Atrial fibrillation ([place]) 09/21/2010  . Basal cell carcinoma 05/25/2014   Multiple removed by Dr. Danella Sensing in March 2015: right neck, left neck, scalp   . Bell's palsy 07/18/1979  . Cervicalgia 01/31/2012  . Closed fracture of lumbar vertebra without mention of spinal cord injury 07/17/2005  . Conjunctiva disorder 12/26/2010  . Coronary atherosclerosis of native coronary artery 07/17/1997  . Cramp of limb 08/16/2009  . Degeneration of lumbar or lumbosacral intervertebral disc 01/31/2012  . Disturbance of skin sensation 08/2009  . Dizziness and giddiness 11/08/2011   vertigo  . External hemorrhoids without mention of complication 4/58/0998  .  External hemorrhoids without mention of complication 51/70/0174  . Ganglion of tendon sheath 08/16/2009  . Leg cramp 10/27/2013   Most frequently the right leg.   . Long term (current) use of anticoagulants 09/2010  . Lumbago 07/2009  . Meralgia paresthetica 07/2007  . MI, old   . Myalgia and myositis, unspecified 01/17/2012  . Osteoporosis   . Other abnormal blood chemistry 04/08/2012  . Other abnormal blood chemistry 2013   hyperglycemia  . Other disorder of muscle, ligament, and fascia 04/08/2012  . Other specified cardiac dysrhythmias(427.89) 06/27/2010  . Pain in joint, ankle and foot 10/27/2013   Bilateral since 1998   . Pain in joint, shoulder region 08/12/2012  . Pain in joint, upper arm 12/26/2010  . Pain in limb  01/11/2011  . Pain in thoracic spine 01/31/2012  . 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 PVC's (premature ventricular contractions)   . Rash and other nonspecific skin eruption 08/02/2011  . Senile osteoporosis 07/17/1993  . Unspecified essential hypertension 07/17/1997  . Varicose veins of lower extremities 08/16/2009  . Varicose veins of lower extremities 08/16/2009    Past Surgical History:  Procedure Laterality Date  . COLONOSCOPY WITH PROPOFOL N/A 10/19/2015   Procedure: COLONOSCOPY WITH PROPOFOL;  Surgeon: Gatha Mayer, MD;  [facility];  Service: Endoscopy;  Laterality: N/A;  . CORONARY ARTERY BYPASS GRAFT  1998   x2; LIMA to LAD; SVG to diagonal off bypass  . HEMORRHOID SURGERY  08/26/2012   Dr. Brantley Stage  . LOOP RECORDER INSERTION N/A 05/13/2018   Procedure: LOOP RECORDER INSERTION;  Surgeon: [name], MD;  Location: [place] [facility];  Service: Cardiovascular;  Laterality: N/A;  . LOOP RECORDER REMOVAL N/A 05/26/2019   Procedure: LOOP RECORDER REMOVAL;  Surgeon: [name], MD;  Location: [place] [facility];  Service: Cardiovascular;  Laterality: N/A;  . MASS EXCISION Left 11/23/2015   Procedure: LEFT WRIST EXCISION CYST;  Surgeon: Leanora Cover, MD;  Location: [place];  Service: Orthopedics;  Laterality: Left;  . PACEMAKER IMPLANT N/A 05/26/2019   Procedure: PACEMAKER IMPLANT;  Surgeon: [name], MD;  Location: [place] [facility];  Service: Cardiovascular;  Laterality: N/A;  . SKIN BIOPSY  01/29/14   (R) neck; (R) scalp, 2 (L) neck; shave biopsy Superficial basal cell carcinoma Dr. Danella Sensing  . TONSILLECTOMY  1940's    Allergies  Allergen Reactions  . Iodinated Diagnostic Agents Nausea Only and Other (See Comments)    Severe nausea and also passed out  . Bactrim Rash  . Relafen [Nabumetone] Rash  . Sulfanilamide Rash    Outpatient Encounter Medications as of 07/28/2020  Medication Sig  . acetaminophen (TYLENOL) 500 MG  tablet Take 1,000 mg by mouth in the morning, at noon, and at bedtime.   Marland Kitchen atenolol (TENORMIN) 50 MG tablet Take 1 tablet (50 mg total) by mouth 2 (two) times daily.  . calcium-vitamin D (OSCAL WITH D) 500-200 MG-UNIT tablet Take 1 tablet by mouth daily.  . digoxin (LANOXIN) 0.125 MG tablet TAKE 1 TABLET EVERY OTHER  DAY  . LORazepam (ATIVAN) 1 MG tablet TAKE 1 TABLET AT BEDTIME TO DECREASE ANXIETY AND HELP WITH SLEEP   No facility-administered encounter medications on file as of 07/28/2020.    Review of Systems:  Review of Systems  Constitutional: Negative for chills, fever, malaise/fatigue and weight loss.       Less energy than she once had  HENT: Positive for hearing loss. Negative  for congestion.        Hearing aids  Eyes: Negative for blurred vision.       Glasses  Respiratory: Negative for cough and shortness of breath.   Cardiovascular: Negative for chest pain, palpitations and leg swelling.  Gastrointestinal: Negative for abdominal pain.  Genitourinary: Negative for dysuria.  Musculoskeletal: Negative for falls and joint pain.  Skin:       Nonhealing punch biopsy on right lower leg--tender surrounding leg (not new that this area is tender by her report)  Neurological: Negative for dizziness and loss of consciousness.  Endo/Heme/Allergies: Bruises/bleeds easily.       No further epistaxis off NOACs and warfarin  Psychiatric/Behavioral: Negative for depression and memory loss. The patient is nervous/anxious. The patient does not have insomnia.        Some slight word finding difficulty today    Health Maintenance  Topic Date Due  . TETANUS/TDAP  10/06/2017  . INFLUENZA VACCINE  06/06/2020  . DEXA SCAN  Completed  . COVID-19 Vaccine  Completed  . PNA vac Low Risk Adult  Completed    Physical Exam: Vitals:   07/28/20 1353  BP: 138/82  Pulse: 85  Temp: (!) 97 F (36.1 C)  TempSrc: Temporal  SpO2: 95%  Weight: 133 lb 3.2 oz (60.4 kg)  Height: 5\' 6"  (1.676 m)    Body mass index is 21.5 kg/m. Physical Exam Vitals reviewed.  Constitutional:      Appearance: Normal appearance.     Comments: Appeared thinner to me though she weighs about the same  HENT:     Head: Normocephalic and atraumatic.     Ears:     Comments: Hearing aids Eyes:     Comments: glasses  Cardiovascular:     Heart sounds: No murmur heard.   Pulmonary:     Effort: Pulmonary effort is normal.     Breath sounds: Normal breath sounds. No rales.  Abdominal:     General: Bowel sounds are normal.     Palpations: Abdomen is soft.     Tenderness: There is no abdominal tenderness. There is no guarding or rebound.  Musculoskeletal:        General: Normal range of motion.     Right lower leg: No edema.     Left lower leg: No edema.     Comments: Edema she had from cyst nearly resolved  Skin:    General: Skin is warm and dry.     Coloration: Skin is pale.     Comments: Right lateral shin punch biopsy site reportedly was size of nickel, now less than a dime and oval vertically, has yellow granulation tissue; tender about it, entire lower leg is erythematous but not warm, dps diminished  Neurological:     General: No focal deficit present.     Mental Status: She is alert and oriented to person, place, and time.     Cranial Nerves: No cranial nerve deficit.     Motor: No weakness.     Gait: Gait normal.  Psychiatric:        Mood and Affect: Mood normal.        Behavior: Behavior normal.     Labs reviewed: Basic Metabolic Panel: Recent Labs    03/04/20 0000 03/04/20 0955 07/12/20 0000  NA  --  133* 142  K  --  4.8 4.1  CL  --  94* 104  CO2  --  26* 29*  BUN  --  18  26*  CREATININE  --  0.9 1.0  CALCIUM 9.2  --  9.8   Liver Function Tests: Recent Labs    03/04/20 0000 03/04/20 0955  AST  --  20  ALT  --  15  ALBUMIN 3.4*  --    No results for input(s): LIPASE, AMYLASE in the last 8760 hours. No results for input(s): AMMONIA in the last 8760  hours. CBC: Recent Labs    03/04/20 0000 07/12/20 0000  WBC 13.5 8.4  HGB 10.5* 12.7  HCT 32* 39  PLT 163 87*   Lipid Panel: Recent Labs    07/12/20 0000  CHOL 166  HDL 53  TRIG 54   Lab Results  Component Value Date   HGBA1C 6.0 03/25/2019    Procedures since last visit: No results found.  Assessment/Plan 1. Baker's cyst of knee, right -recovering well from this, it had hemorrhaged and her anticoagulation had been stopped -she's done better since with that and we opted not to resume it despite stroke risk b/c she had such serious bleeding in the leg and such severe epistaxis recurrently on 3 different blood thinners  2. Nonhealing skin ulcer with fat layer exposed ([place]) - was skin cancer biopsy site--reportedly negative for ca - VAS [place] LOWER EXT ART SEG MULTI (SEGMENTALS & LE RAYNAUDS); Future due to h/o CAD and nonhealing wound  3. Coronary artery disease involving native coronary artery of native heart without angina pectoris - cont secondary prevention of mi with bp, lipid control, healthy diet and exercise program - VAS [place] LOWER EXT ART SEG MULTI (SEGMENTALS & LE RAYNAUDS); Future  4. Thrombocytopenia (HCC) -platelets down to 87 this time -no easy bleeding or bruising, but this is lowest she's had -some normal readings and some low over past few years -f/u before next visit  5. Permanent atrial fibrillation (HCC) -off anticoagulation due to hemorrhage baker's cyst and recurrent epistaxis -cont atenolol rate control  6. Hypercoagulable state due to atrial fibrillation (HCC) -off anticoagulation due to bleeding episodes  Labs/tests ordered:  Arterial doppler with ABIs  Next appt: 6 mos med mgt with cbc before  Amori Cooperman L. Nicki Furlan, D.O. [facility] [address], [place] [address] Cell Phone (Mon-Fri 8am-5pm):  [phone] On Call:  [phone] & follow prompts after 5pm & weekends Office Phone:   [phone] Office Fax:  [phone]

## 2020-08-10 ENCOUNTER — Other Ambulatory Visit: Payer: Self-pay

## 2020-08-10 ENCOUNTER — Ambulatory Visit (INDEPENDENT_AMBULATORY_CARE_PROVIDER_SITE_OTHER): Payer: Medicare Other | Admitting: Podiatry

## 2020-08-10 DIAGNOSIS — M79675 Pain in left toe(s): Secondary | ICD-10-CM

## 2020-08-10 DIAGNOSIS — M79674 Pain in right toe(s): Secondary | ICD-10-CM

## 2020-08-10 DIAGNOSIS — B351 Tinea unguium: Secondary | ICD-10-CM

## 2020-08-10 DIAGNOSIS — Z7901 Long term (current) use of anticoagulants: Secondary | ICD-10-CM

## 2020-08-10 NOTE — Patient Instructions (Signed)
Apply Tea Tree oil or fungi-nail to toenails once daily

## 2020-08-11 NOTE — Progress Notes (Signed)
Subjective: 86 year old female presents the office today for concerns of left big toenail becoming thickened discolored again as well as the left second toe started to the same thing.  She states it rubs on her shoes causing discomfort but she denies any drainage or pus or any swelling or redness.  She recently had a squamous cell removed from her right ankle with a bandage intact.  She is scheduled for arterial studies coming up.  Denies any systemic complaints such as fevers, chills, nausea, vomiting. No acute changes since last appointment, and no other complaints at this time.   Objective: AAO x3, NAD DP/PT pulses decreased bilaterally, CRT less than 3 seconds Bandage intact to right ankle.  No open lesions identified otherwise.  Left hallux and second digit toenails hypertrophic, dystrophic with yellow discoloration.  Nail causes irritation particularly in shoes.  There is no drainage or pus identified today. No pain with calf compression, swelling, warmth, erythema  Assessment: Symptomatic onychomycosis  Plan: -All treatment options discussed with the patient including all alternatives, risks, complications.  -Debrided nails x2 without any complications or bleeding.  She is interested in nail removal.  Will await the results of the arterial studies prior to this. -She is asking there is any topical medication she can part of the nails.  However discussed with her that given the thickening I am not sure this will be helpful.  She wants to try something.  We discussed over-the-counter Fungi-Nail/tea tree oil -Patient encouraged to call the office with any questions, concerns, change in symptoms.   Trula Slade DPM

## 2020-08-17 ENCOUNTER — Encounter (HOSPITAL_COMMUNITY): Payer: Self-pay | Admitting: Internal Medicine

## 2020-08-17 ENCOUNTER — Emergency Department (HOSPITAL_COMMUNITY): Payer: Medicare Other

## 2020-08-17 ENCOUNTER — Other Ambulatory Visit: Payer: Self-pay

## 2020-08-17 ENCOUNTER — Inpatient Hospital Stay (HOSPITAL_COMMUNITY): Payer: Medicare Other

## 2020-08-17 ENCOUNTER — Inpatient Hospital Stay (HOSPITAL_COMMUNITY)
Admission: EM | Admit: 2020-08-17 | Discharge: 2020-08-23 | DRG: 535 | Disposition: A | Discharge status: Skilled Nursing Facility | Payer: Medicare Other | Attending: Family Medicine | Admitting: Family Medicine

## 2020-08-17 DIAGNOSIS — R5381 Other malaise: Secondary | ICD-10-CM | POA: Diagnosis not present

## 2020-08-17 DIAGNOSIS — M25552 Pain in left hip: Secondary | ICD-10-CM | POA: Diagnosis present

## 2020-08-17 DIAGNOSIS — Z85828 Personal history of other malignant neoplasm of skin: Secondary | ICD-10-CM

## 2020-08-17 DIAGNOSIS — M25522 Pain in left elbow: Secondary | ICD-10-CM | POA: Diagnosis present

## 2020-08-17 DIAGNOSIS — Z8249 Family history of ischemic heart disease and other diseases of the circulatory system: Secondary | ICD-10-CM

## 2020-08-17 DIAGNOSIS — L97919 Non-pressure chronic ulcer of unspecified part of right lower leg with unspecified severity: Secondary | ICD-10-CM | POA: Diagnosis present

## 2020-08-17 DIAGNOSIS — I480 Paroxysmal atrial fibrillation: Secondary | ICD-10-CM | POA: Diagnosis present

## 2020-08-17 DIAGNOSIS — S3289XA Fracture of other parts of pelvis, initial encounter for closed fracture: Secondary | ICD-10-CM | POA: Diagnosis not present

## 2020-08-17 DIAGNOSIS — L89891 Pressure ulcer of other site, stage 1: Secondary | ICD-10-CM | POA: Diagnosis present

## 2020-08-17 DIAGNOSIS — Z91041 Radiographic dye allergy status: Secondary | ICD-10-CM

## 2020-08-17 DIAGNOSIS — Z20822 Contact with and (suspected) exposure to covid-19: Secondary | ICD-10-CM | POA: Diagnosis present

## 2020-08-17 DIAGNOSIS — Z888 Allergy status to other drugs, medicaments and biological substances status: Secondary | ICD-10-CM

## 2020-08-17 DIAGNOSIS — W19XXXA Unspecified fall, initial encounter: Secondary | ICD-10-CM

## 2020-08-17 DIAGNOSIS — S300XXA Contusion of lower back and pelvis, initial encounter: Secondary | ICD-10-CM | POA: Diagnosis present

## 2020-08-17 DIAGNOSIS — S32592A Other specified fracture of left pubis, initial encounter for closed fracture: Principal | ICD-10-CM | POA: Diagnosis present

## 2020-08-17 DIAGNOSIS — I6502 Occlusion and stenosis of left vertebral artery: Secondary | ICD-10-CM | POA: Diagnosis not present

## 2020-08-17 DIAGNOSIS — S329XXA Fracture of unspecified parts of lumbosacral spine and pelvis, initial encounter for closed fracture: Secondary | ICD-10-CM | POA: Diagnosis not present

## 2020-08-17 DIAGNOSIS — M810 Age-related osteoporosis without current pathological fracture: Secondary | ICD-10-CM | POA: Diagnosis present

## 2020-08-17 DIAGNOSIS — I6389 Other cerebral infarction: Secondary | ICD-10-CM | POA: Diagnosis present

## 2020-08-17 DIAGNOSIS — S36892A Contusion of other intra-abdominal organs, initial encounter: Secondary | ICD-10-CM | POA: Diagnosis not present

## 2020-08-17 DIAGNOSIS — I4891 Unspecified atrial fibrillation: Secondary | ICD-10-CM | POA: Diagnosis not present

## 2020-08-17 DIAGNOSIS — Z7901 Long term (current) use of anticoagulants: Secondary | ICD-10-CM

## 2020-08-17 DIAGNOSIS — D696 Thrombocytopenia, unspecified: Secondary | ICD-10-CM | POA: Diagnosis present

## 2020-08-17 DIAGNOSIS — R29708 NIHSS score 8: Secondary | ICD-10-CM | POA: Diagnosis present

## 2020-08-17 DIAGNOSIS — K661 Hemoperitoneum: Secondary | ICD-10-CM | POA: Diagnosis not present

## 2020-08-17 DIAGNOSIS — W010XXA Fall on same level from slipping, tripping and stumbling without subsequent striking against object, initial encounter: Secondary | ICD-10-CM | POA: Diagnosis present

## 2020-08-17 DIAGNOSIS — I361 Nonrheumatic tricuspid (valve) insufficiency: Secondary | ICD-10-CM | POA: Diagnosis not present

## 2020-08-17 DIAGNOSIS — Z950 Presence of cardiac pacemaker: Secondary | ICD-10-CM

## 2020-08-17 DIAGNOSIS — I495 Sick sinus syndrome: Secondary | ICD-10-CM | POA: Diagnosis present

## 2020-08-17 DIAGNOSIS — S3210XA Unspecified fracture of sacrum, initial encounter for closed fracture: Secondary | ICD-10-CM

## 2020-08-17 DIAGNOSIS — I639 Cerebral infarction, unspecified: Secondary | ICD-10-CM

## 2020-08-17 DIAGNOSIS — I10 Essential (primary) hypertension: Secondary | ICD-10-CM | POA: Diagnosis present

## 2020-08-17 DIAGNOSIS — K683 Retroperitoneal hematoma: Secondary | ICD-10-CM

## 2020-08-17 DIAGNOSIS — R0902 Hypoxemia: Secondary | ICD-10-CM | POA: Diagnosis not present

## 2020-08-17 DIAGNOSIS — R29810 Facial weakness: Secondary | ICD-10-CM | POA: Diagnosis not present

## 2020-08-17 DIAGNOSIS — I2510 Atherosclerotic heart disease of native coronary artery without angina pectoris: Secondary | ICD-10-CM | POA: Diagnosis present

## 2020-08-17 DIAGNOSIS — D500 Iron deficiency anemia secondary to blood loss (chronic): Secondary | ICD-10-CM | POA: Diagnosis present

## 2020-08-17 DIAGNOSIS — Z882 Allergy status to sulfonamides status: Secondary | ICD-10-CM

## 2020-08-17 DIAGNOSIS — S32402A Unspecified fracture of left acetabulum, initial encounter for closed fracture: Secondary | ICD-10-CM

## 2020-08-17 DIAGNOSIS — I69354 Hemiplegia and hemiparesis following cerebral infarction affecting left non-dominant side: Secondary | ICD-10-CM | POA: Diagnosis not present

## 2020-08-17 DIAGNOSIS — Z043 Encounter for examination and observation following other accident: Secondary | ICD-10-CM | POA: Diagnosis not present

## 2020-08-17 DIAGNOSIS — R58 Hemorrhage, not elsewhere classified: Secondary | ICD-10-CM | POA: Diagnosis present

## 2020-08-17 DIAGNOSIS — S3219XA Other fracture of sacrum, initial encounter for closed fracture: Secondary | ICD-10-CM | POA: Diagnosis not present

## 2020-08-17 DIAGNOSIS — F419 Anxiety disorder, unspecified: Secondary | ICD-10-CM | POA: Diagnosis present

## 2020-08-17 DIAGNOSIS — Z7401 Bed confinement status: Secondary | ICD-10-CM | POA: Diagnosis not present

## 2020-08-17 DIAGNOSIS — Z66 Do not resuscitate: Secondary | ICD-10-CM | POA: Diagnosis present

## 2020-08-17 DIAGNOSIS — E785 Hyperlipidemia, unspecified: Secondary | ICD-10-CM | POA: Diagnosis present

## 2020-08-17 DIAGNOSIS — I252 Old myocardial infarction: Secondary | ICD-10-CM

## 2020-08-17 DIAGNOSIS — Z79899 Other long term (current) drug therapy: Secondary | ICD-10-CM

## 2020-08-17 DIAGNOSIS — I340 Nonrheumatic mitral (valve) insufficiency: Secondary | ICD-10-CM | POA: Diagnosis not present

## 2020-08-17 DIAGNOSIS — S32502A Unspecified fracture of left pubis, initial encounter for closed fracture: Secondary | ICD-10-CM | POA: Diagnosis not present

## 2020-08-17 DIAGNOSIS — R4701 Aphasia: Secondary | ICD-10-CM | POA: Diagnosis present

## 2020-08-17 DIAGNOSIS — S32512A Fracture of superior rim of left pubis, initial encounter for closed fracture: Secondary | ICD-10-CM | POA: Diagnosis not present

## 2020-08-17 DIAGNOSIS — L8990 Pressure ulcer of unspecified site, unspecified stage: Secondary | ICD-10-CM | POA: Insufficient documentation

## 2020-08-17 DIAGNOSIS — M25532 Pain in left wrist: Secondary | ICD-10-CM | POA: Diagnosis not present

## 2020-08-17 DIAGNOSIS — S32502D Unspecified fracture of left pubis, subsequent encounter for fracture with routine healing: Secondary | ICD-10-CM | POA: Diagnosis not present

## 2020-08-17 DIAGNOSIS — M2550 Pain in unspecified joint: Secondary | ICD-10-CM | POA: Diagnosis not present

## 2020-08-17 DIAGNOSIS — Z951 Presence of aortocoronary bypass graft: Secondary | ICD-10-CM

## 2020-08-17 LAB — URINALYSIS, ROUTINE W REFLEX MICROSCOPIC
Bilirubin Urine: NEGATIVE
Glucose, UA: NEGATIVE mg/dL
Ketones, ur: NEGATIVE mg/dL
Leukocytes,Ua: NEGATIVE
Nitrite: NEGATIVE
Protein, ur: NEGATIVE mg/dL
Specific Gravity, Urine: 1.011 (ref 1.005–1.030)
pH: 5 (ref 5.0–8.0)

## 2020-08-17 LAB — BASIC METABOLIC PANEL
Anion gap: 13 (ref 5–15)
BUN: 24 mg/dL — ABNORMAL HIGH (ref 8–23)
CO2: 24 mmol/L (ref 22–32)
Calcium: 9.3 mg/dL (ref 8.9–10.3)
Chloride: 105 mmol/L (ref 98–111)
Creatinine, Ser: 0.89 mg/dL (ref 0.44–1.00)
GFR, Estimated: 57 mL/min — ABNORMAL LOW (ref >60)
Glucose, Bld: 112 mg/dL — ABNORMAL HIGH (ref 70–99)
Potassium: 3.7 mmol/L (ref 3.5–5.1)
Sodium: 142 mmol/L (ref 135–145)

## 2020-08-17 LAB — CBC WITH DIFFERENTIAL/PLATELET
Abs Immature Granulocytes: 0.54 10*3/uL — ABNORMAL HIGH (ref 0.00–0.07)
Abs Immature Granulocytes: 0.33 10*3/uL — ABNORMAL HIGH (ref 0.00–0.07)
Basophils Absolute: 0.1 10*3/uL (ref 0.0–0.1)
Basophils Absolute: 0 10*3/uL (ref 0.0–0.1)
Basophils Relative: 0 %
Basophils Relative: 0 %
Eosinophils Absolute: 0 10*3/uL (ref 0.0–0.5)
Eosinophils Absolute: 0 10*3/uL (ref 0.0–0.5)
Eosinophils Relative: 0 %
Eosinophils Relative: 0 %
HCT: 35 % — ABNORMAL LOW (ref 36.0–46.0)
HCT: 31.3 % — ABNORMAL LOW (ref 36.0–46.0)
Hemoglobin: 11.2 g/dL — ABNORMAL LOW (ref 12.0–15.0)
Hemoglobin: 9.7 g/dL — ABNORMAL LOW (ref 12.0–15.0)
Immature Granulocytes: 4 %
Immature Granulocytes: 3 %
Lymphocytes Relative: 6 %
Lymphocytes Relative: 5 %
Lymphs Abs: 1 10*3/uL (ref 0.7–4.0)
Lymphs Abs: 0.6 10*3/uL — ABNORMAL LOW (ref 0.7–4.0)
MCH: 29.6 pg (ref 26.0–34.0)
MCH: 29 pg (ref 26.0–34.0)
MCHC: 32 g/dL (ref 30.0–36.0)
MCHC: 31 g/dL (ref 30.0–36.0)
MCV: 92.3 fL (ref 80.0–100.0)
MCV: 93.4 fL (ref 80.0–100.0)
Monocytes Absolute: 2 10*3/uL — ABNORMAL HIGH (ref 0.1–1.0)
Monocytes Absolute: 0.6 10*3/uL (ref 0.1–1.0)
Monocytes Relative: 13 %
Monocytes Relative: 5 %
Neutro Abs: 11.9 10*3/uL — ABNORMAL HIGH (ref 1.7–7.7)
Neutro Abs: 11.1 10*3/uL — ABNORMAL HIGH (ref 1.7–7.7)
Neutrophils Relative %: 77 %
Neutrophils Relative %: 87 %
Platelets: 84 10*3/uL — ABNORMAL LOW (ref 150–400)
Platelets: 81 10*3/uL — ABNORMAL LOW (ref 150–400)
RBC: 3.79 MIL/uL — ABNORMAL LOW (ref 3.87–5.11)
RBC: 3.35 MIL/uL — ABNORMAL LOW (ref 3.87–5.11)
RDW: 14.6 % (ref 11.5–15.5)
RDW: 14.7 % (ref 11.5–15.5)
WBC: 15.5 10*3/uL — ABNORMAL HIGH (ref 4.0–10.5)
WBC: 12.7 10*3/uL — ABNORMAL HIGH (ref 4.0–10.5)
nRBC: 0 % (ref 0.0–0.2)
nRBC: 0 % (ref 0.0–0.2)

## 2020-08-17 LAB — GLUCOSE, CAPILLARY: Glucose-Capillary: 149 mg/dL — ABNORMAL HIGH (ref 70–99)

## 2020-08-17 LAB — HEPATIC FUNCTION PANEL
ALT: 24 U/L (ref 0–44)
AST: 32 U/L (ref 15–41)
Albumin: 3.7 g/dL (ref 3.5–5.0)
Alkaline Phosphatase: 62 U/L (ref 38–126)
Bilirubin, Direct: 0.2 mg/dL (ref 0.0–0.2)
Indirect Bilirubin: 1 mg/dL — ABNORMAL HIGH (ref 0.3–0.9)
Total Bilirubin: 1.2 mg/dL (ref 0.3–1.2)
Total Protein: 6.5 g/dL (ref 6.5–8.1)

## 2020-08-17 LAB — RESPIRATORY PANEL BY RT PCR (FLU A&B, COVID)
Influenza A by PCR: NEGATIVE
Influenza B by PCR: NEGATIVE
SARS Coronavirus 2 by RT PCR: NEGATIVE

## 2020-08-17 LAB — PROTIME-INR
INR: 1.2 (ref 0.8–1.2)
Prothrombin Time: 15.1 seconds (ref 11.4–15.2)

## 2020-08-17 IMAGING — CT CT PELVIS W/O CM
2 of 4 series · 14 of 46 positions shown, 16 images · non-contrast
Comparison: None.

CLINICAL DATA: Fall with left hip pain.

EXAM:
CT PELVIS WITHOUT CONTRAST
TECHNIQUE: Multidetector CT imaging of the pelvis was performed following the
standard protocol without intravenous contrast.

[Series 4: axial st · axial · 0.97mm/px · z∈[+1050,+1244]mm · 11 of 47 slices shown, 13 images]
[im 4/47  soft-tissue]
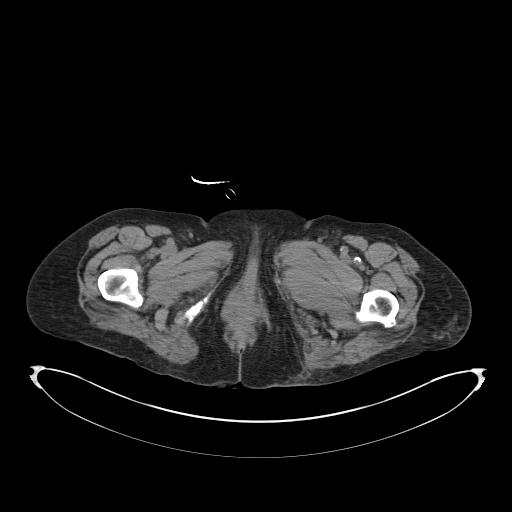
[im 4/47  bone]
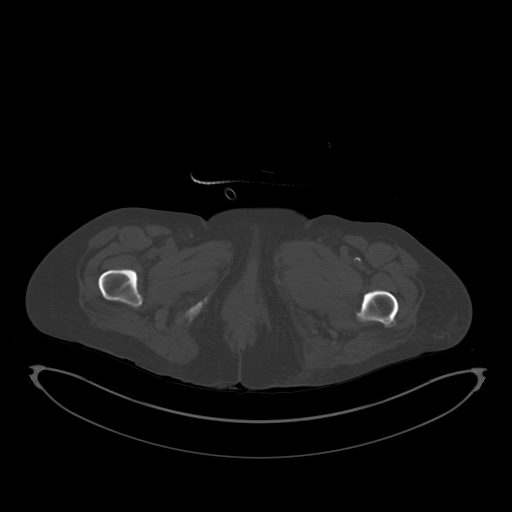
[im 7/47  soft-tissue]
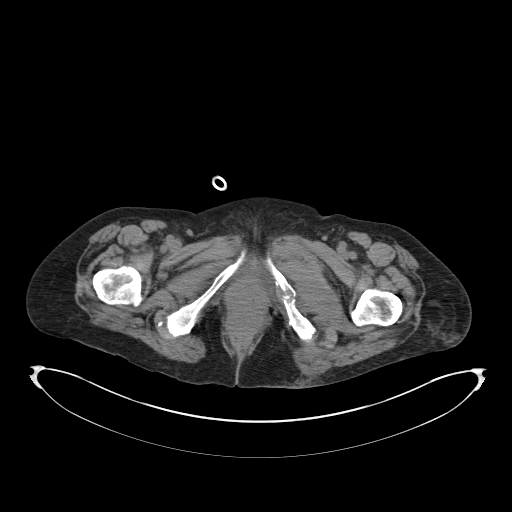
[im 11/47  soft-tissue]
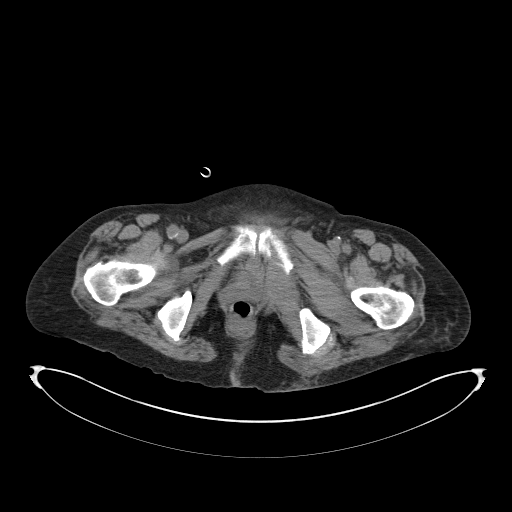
[im 16/47  soft-tissue]
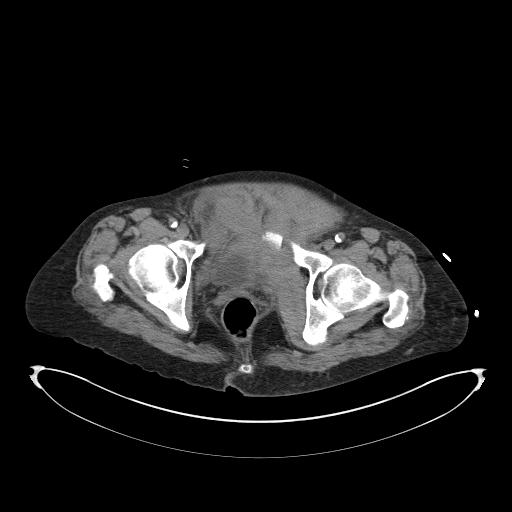
[im 19/47  soft-tissue]
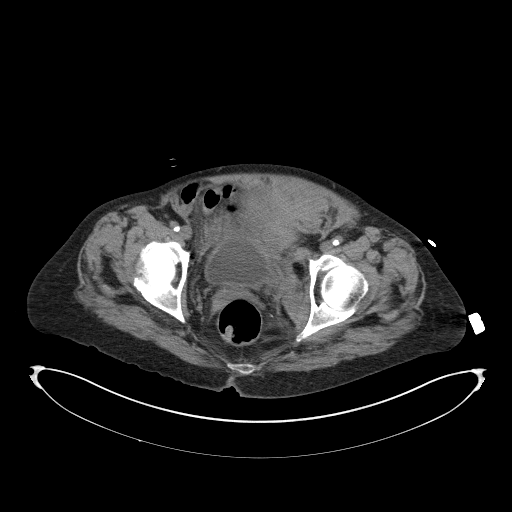
[im 24/47  soft-tissue]
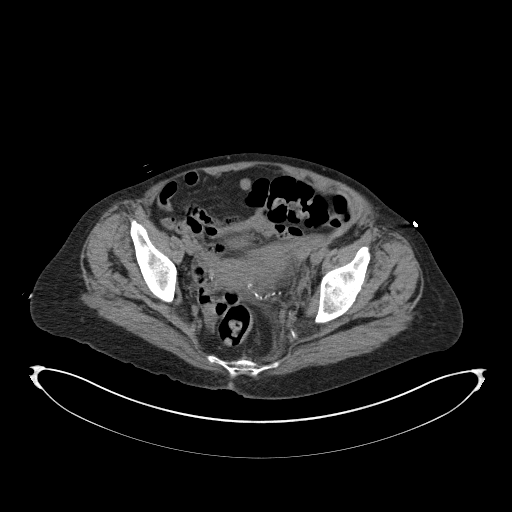
[im 28/47  soft-tissue]
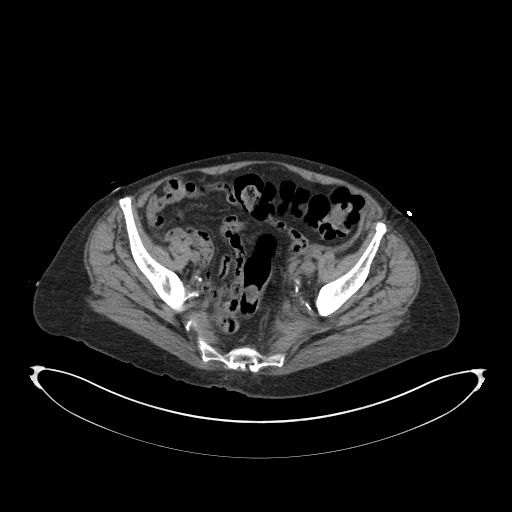
[im 31/47  soft-tissue]
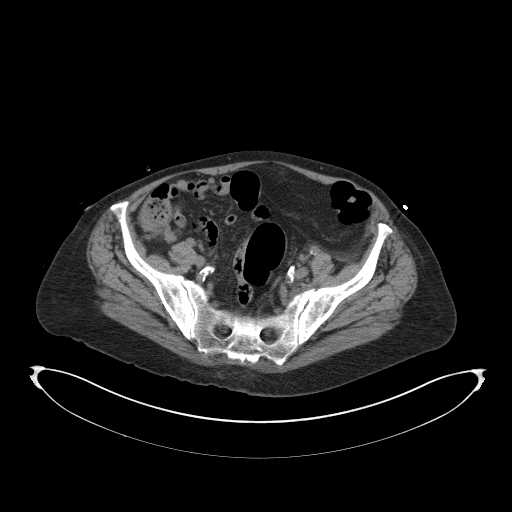
[im 36/47  soft-tissue]
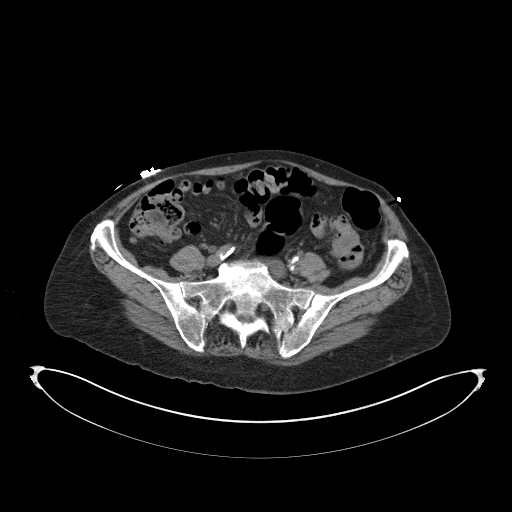
[im 36/47  bone]
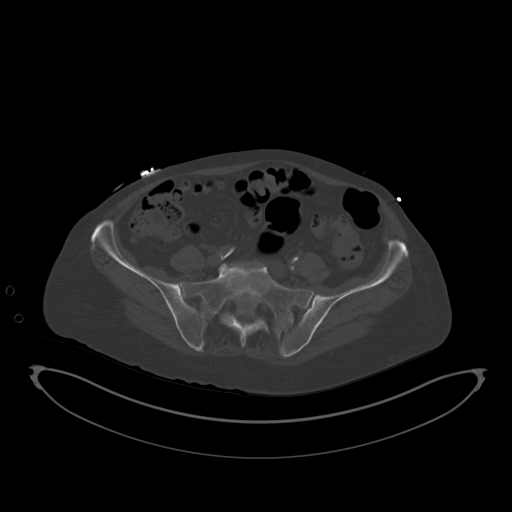
[im 40/47  soft-tissue]
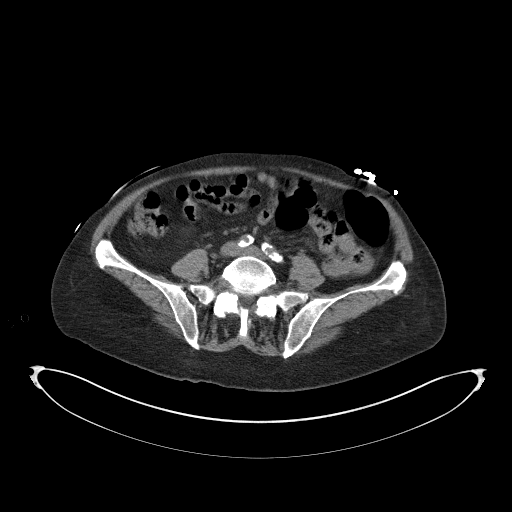
[im 43/47  soft-tissue]
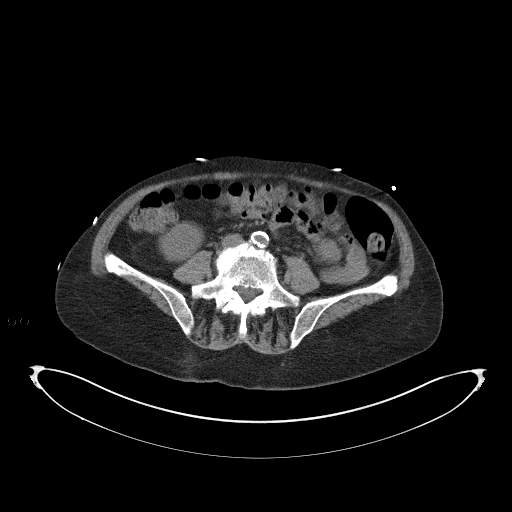

[Series 8: coronal st · coronal · 0.46mm/px · 3 of 119 slices shown]
[im 40/119  soft-tissue]
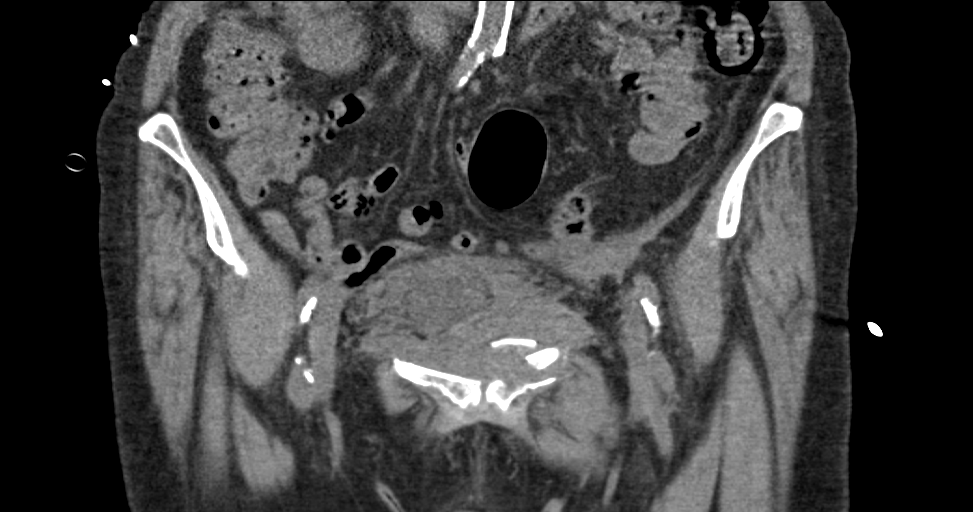
[im 53/119  soft-tissue]
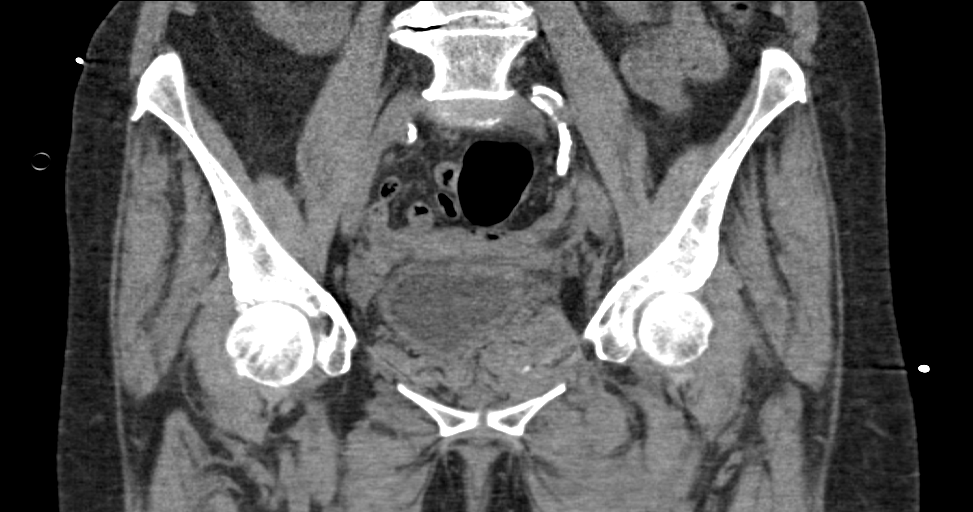
[im 66/119  soft-tissue]
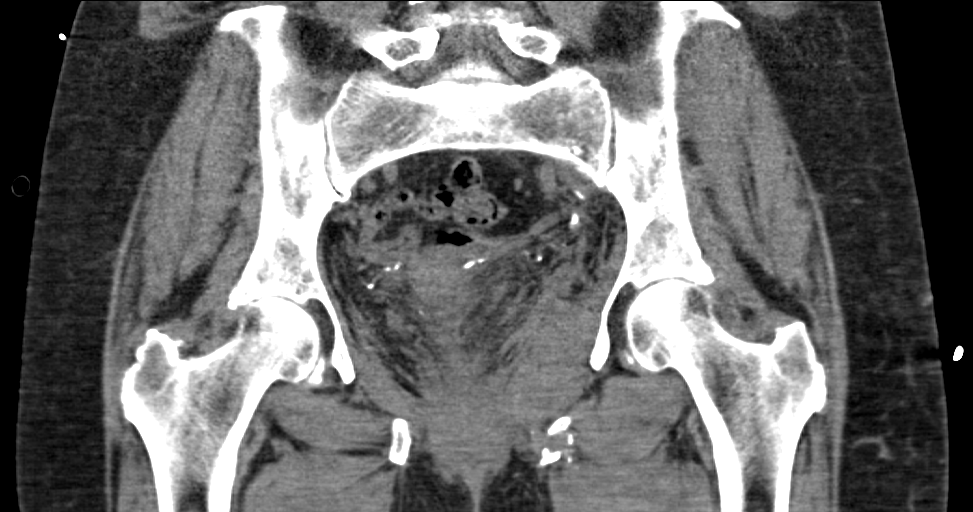

[14 of 46 positions shown; findings below may reference images not displayed]

FINDINGS: Urinary Tract: Mild bladder wall thickening noted with incomplete
bladder distention.

Bowel:  Unremarkable visualized pelvic bowel loops.

Vascular/Lymphatic: Atherosclerotic calcification noted distal aorta
and common iliac arteries. No pelvic sidewall lymphadenopathy.

Reproductive:  Unremarkable.

Other:  No intraperitoneal free fluid.

Musculoskeletal: Comminuted fracture of the left superior and
inferior pubic rami. Superior ramus fracture extends into the left
pubic bone. There is extensive extraperitoneal hemorrhage in the
left pelvic floor extending into the anterior abdominal wall and
displacing the bladder cranially and to the right.

There is an associated fracture of the left sacrum extending into
the SI joint without diastasis.
IMPRESSION: 1. Comminuted fracture of the left superior and inferior pubic rami
with extension into the left pubic bone.
2. Associated fracture of the left sacrum extending into the SI
joint without diastasis.
3. Extensive extraperitoneal hemorrhage in the left anterior pelvic
floor extending into the anterior abdominal wall and displacing the
bladder cranially and to the right.
4. Aortic Atherosclerosis ([BT]-[BT]).

## 2020-08-17 IMAGING — DX DG WRIST 2V*L*
2 series · 2 of 2 positions shown · non-contrast
Comparison: None.

CLINICAL DATA: Left wrist pain after a fall this morning.

EXAM:
LEFT WRIST - 2 VIEW

[wrist ap]
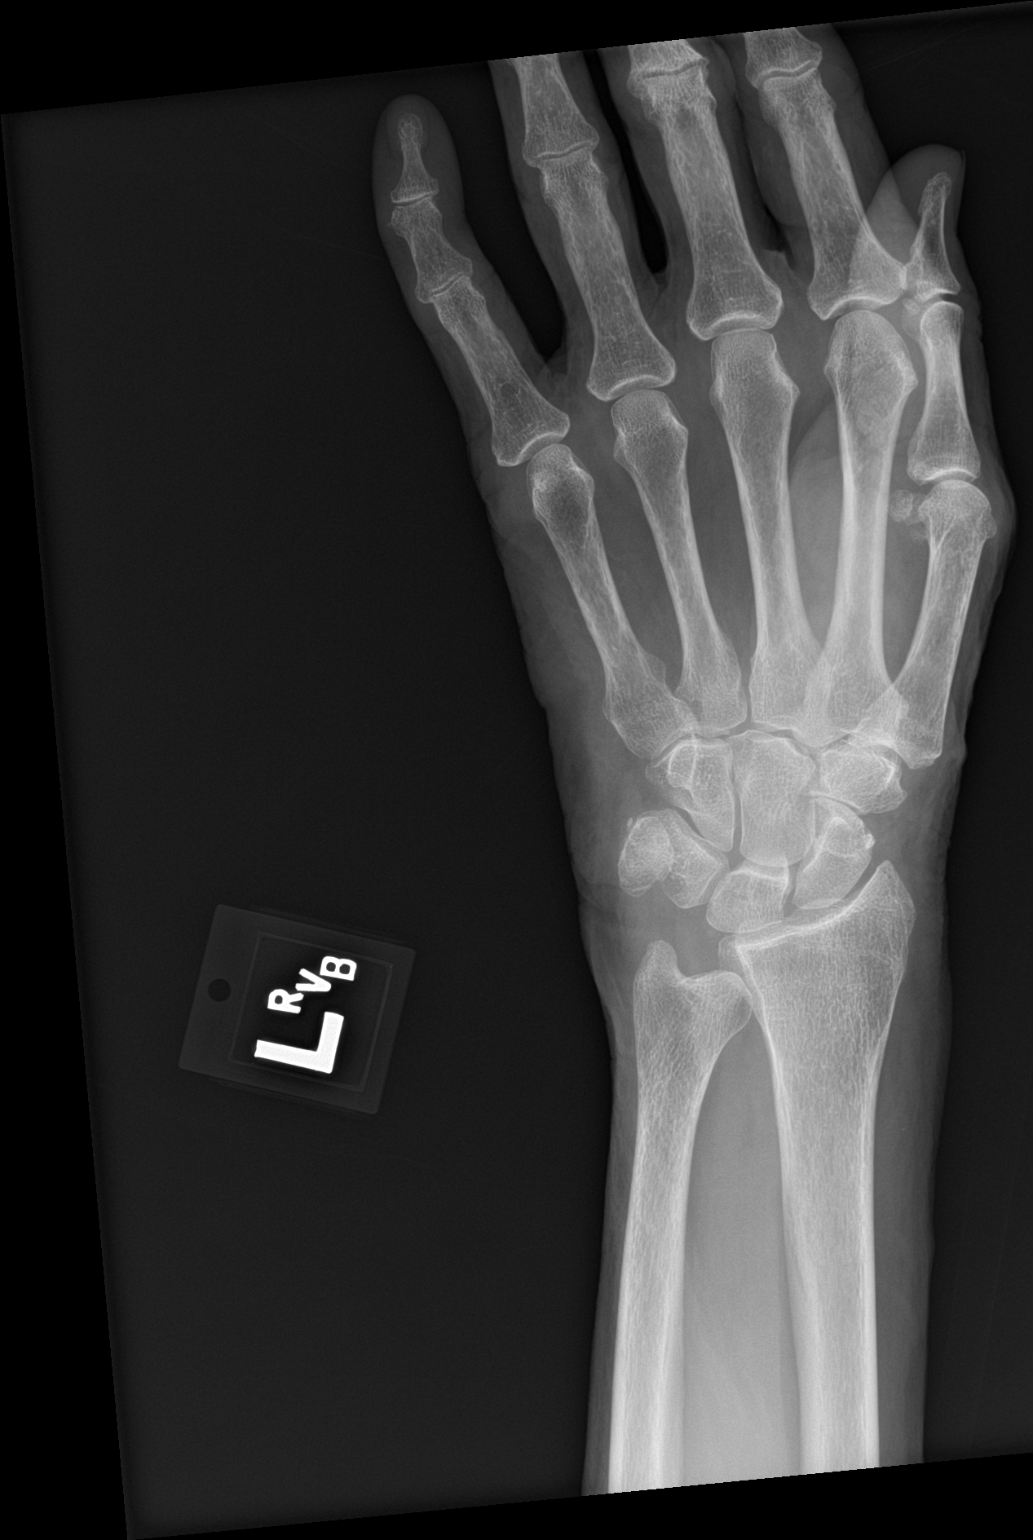

[wrist obl]
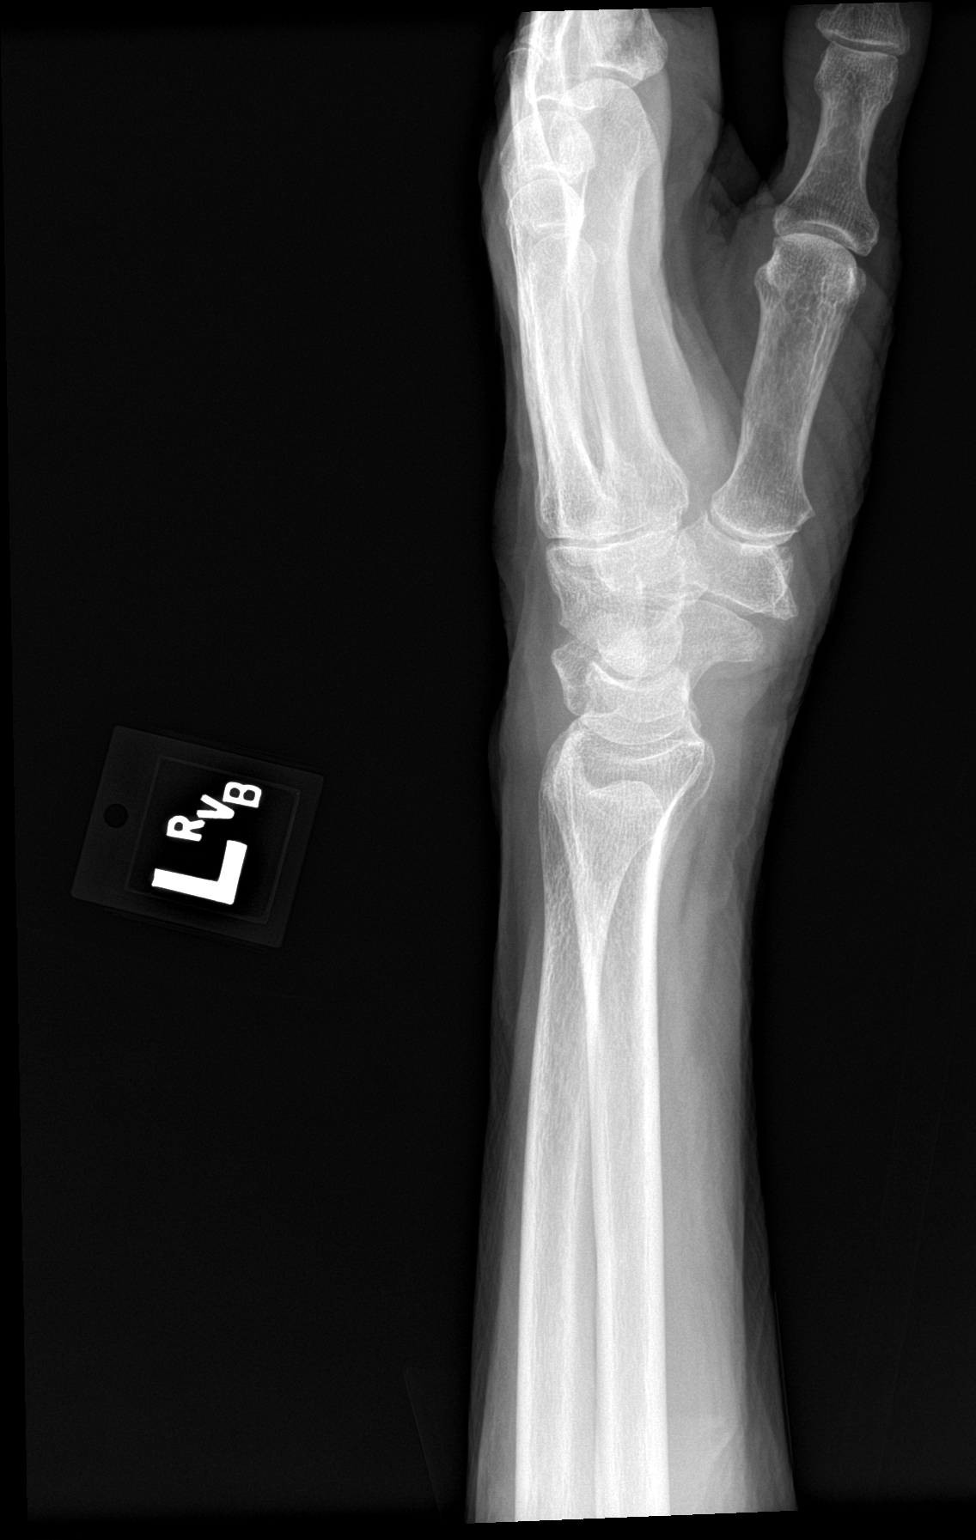

[2 of 2 positions shown; findings below may reference images not displayed]

FINDINGS: There is no evidence of fracture or dislocation. There is no
evidence of arthropathy or other focal bone abnormality. Ulnar minus
variance noted. Soft tissues are unremarkable.
IMPRESSION: No acute abnormality.

## 2020-08-17 IMAGING — DX DG ELBOW 2V*L*
2 series · 2 of 2 positions shown · non-contrast
Comparison: None.

CLINICAL DATA: Left elbow pain since a fall this morning. Initial
encounter.

EXAM:
LEFT ELBOW - 2 VIEW

[elbow ap]
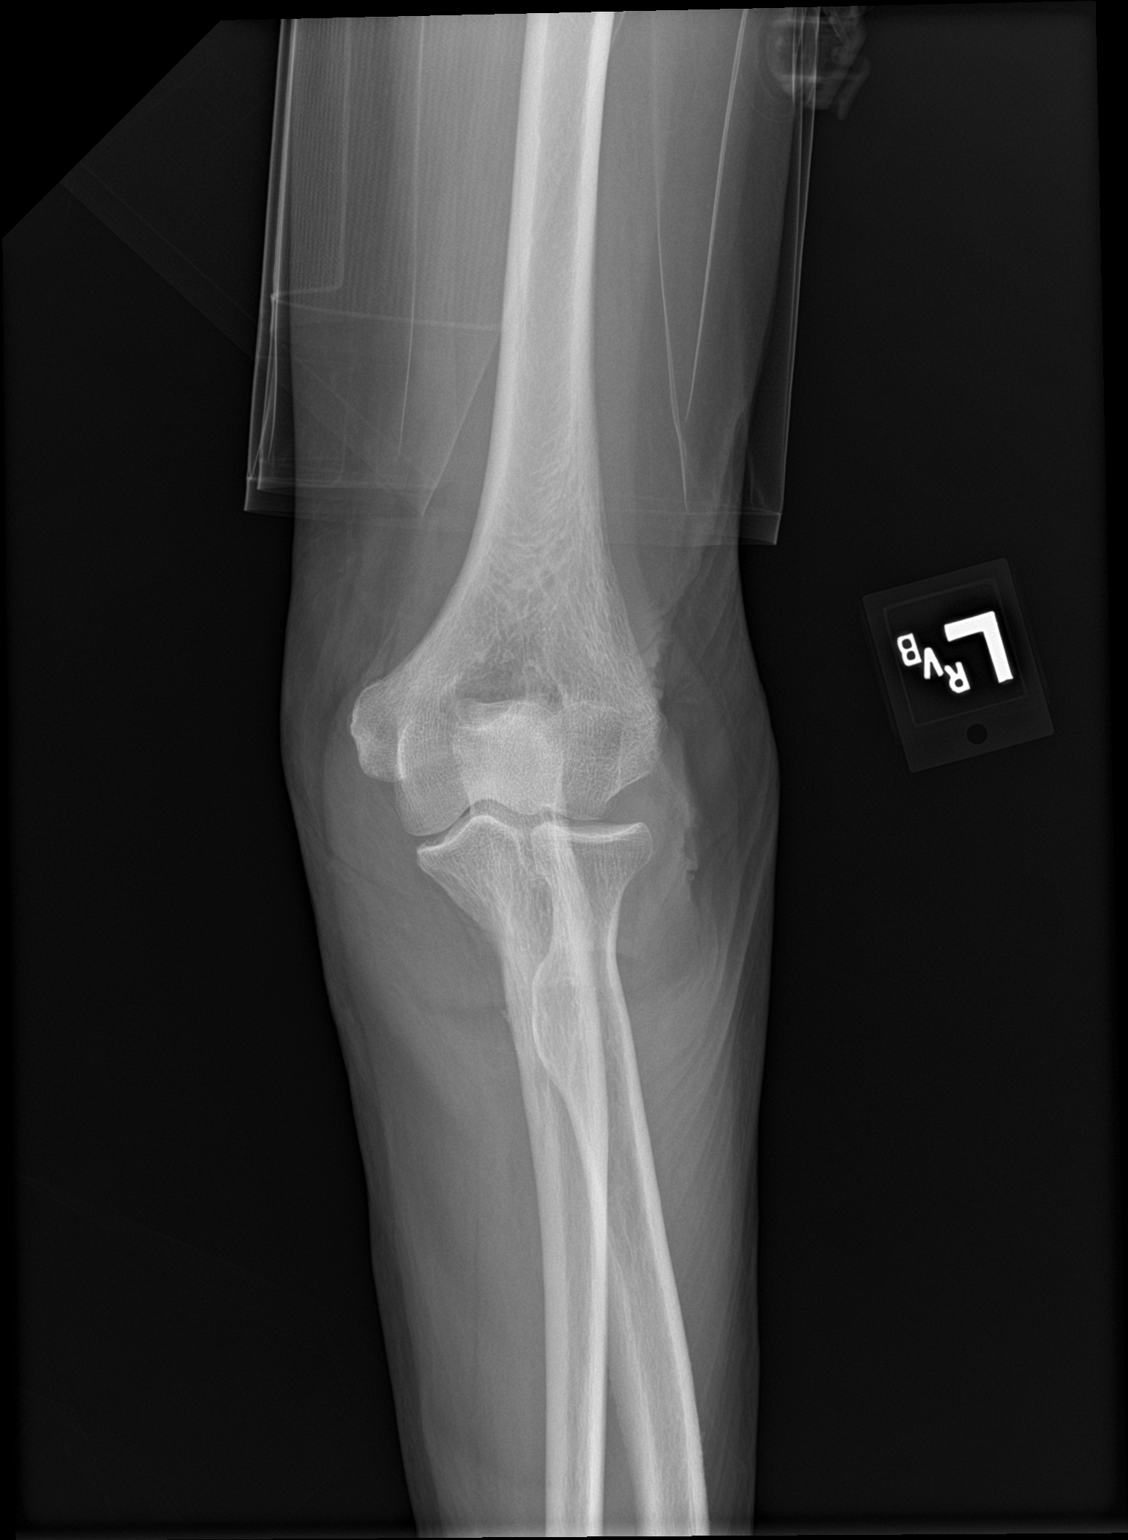

[elbow lat]
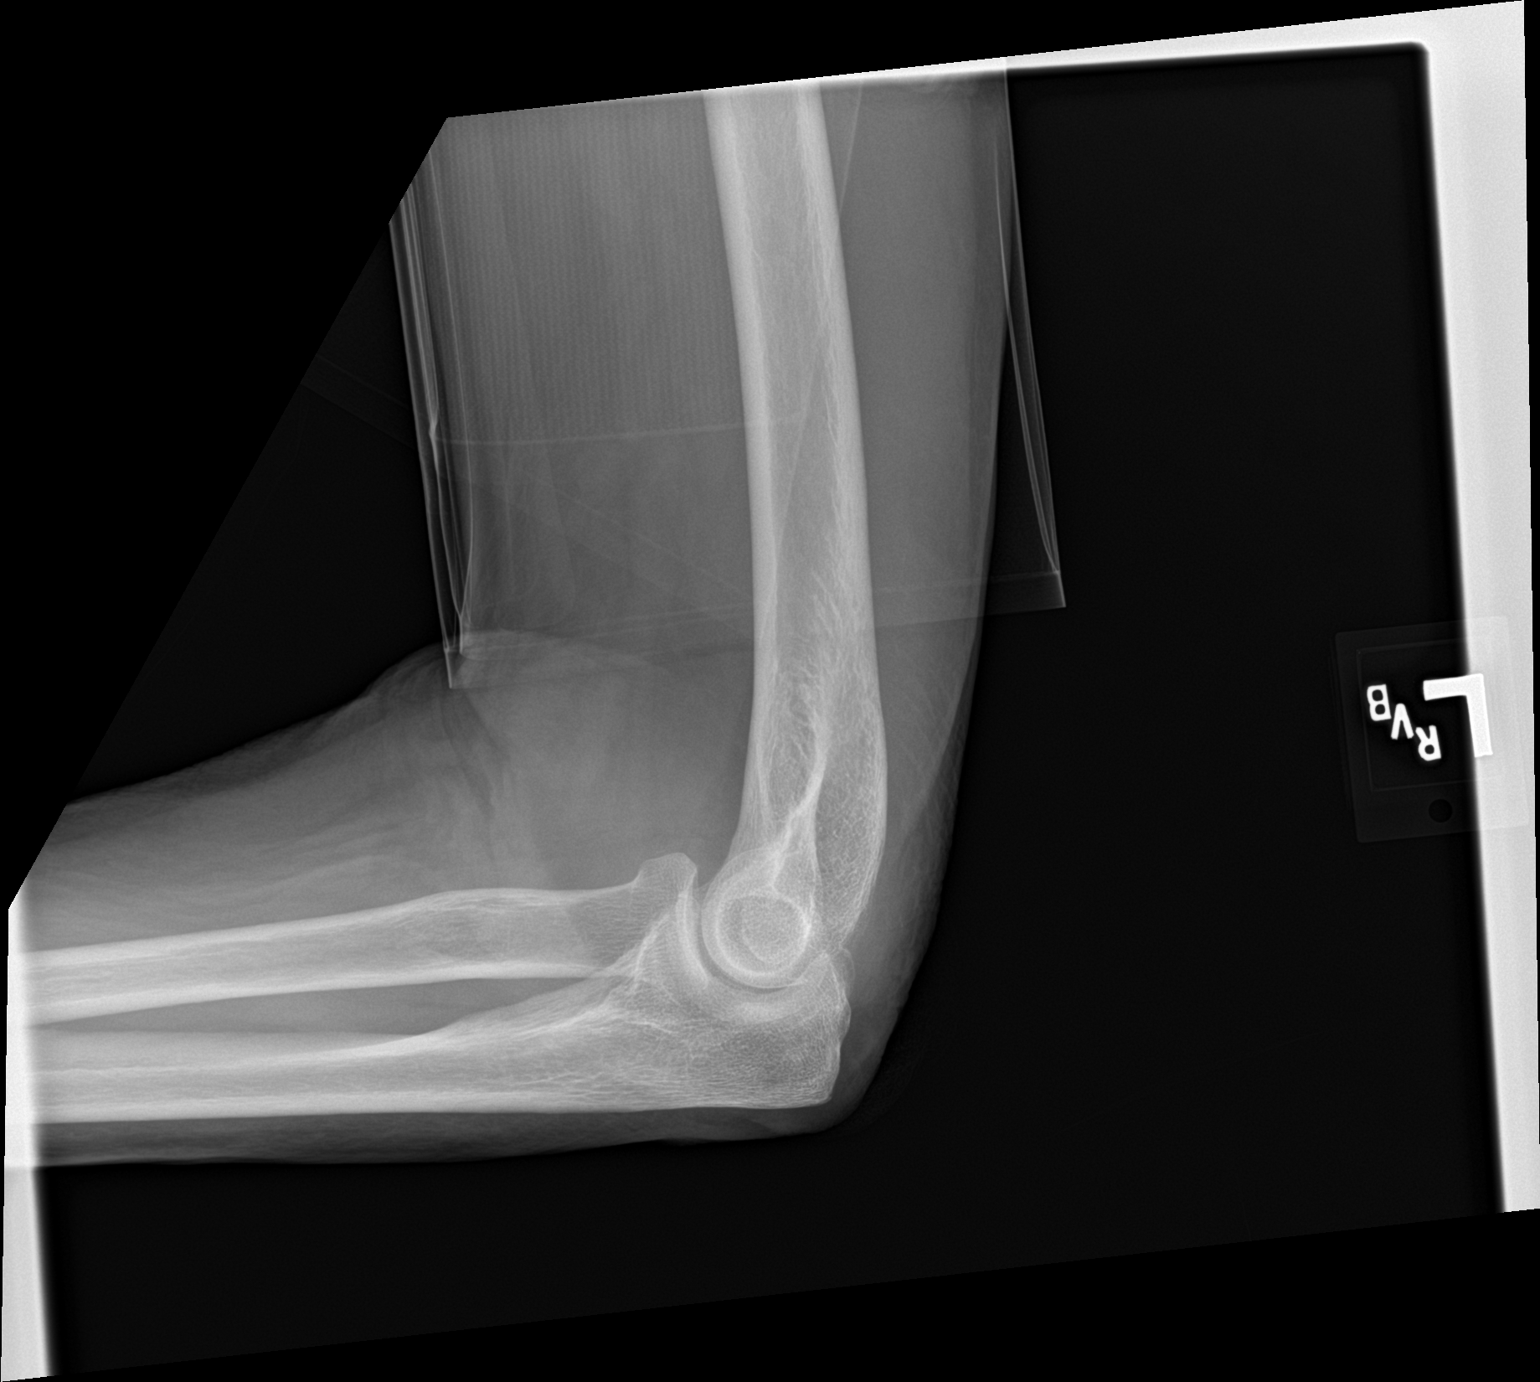

[2 of 2 positions shown; findings below may reference images not displayed]

FINDINGS: There is no evidence of fracture, dislocation, or joint effusion.
There is no evidence of arthropathy or other focal bone abnormality.
Soft tissues are unremarkable.
IMPRESSION: Negative exam.

## 2020-08-17 IMAGING — CT CT ABD-PELV W/ CM
2 of 5 series · 16 of 46 positions shown, 18 images · IV contrast (OMNIPAQUE 300)
Comparison: Pelvis CT earlier same day

CLINICAL DATA: Pelvic fractures with hemorrhage

EXAM:
CT ABDOMEN AND PELVIS WITH CONTRAST
TECHNIQUE: Multidetector CT imaging of the abdomen and pelvis was performed
using the standard protocol following bolus administration of
intravenous contrast.
CONTRAST:  100mL OMNIPAQUE IOHEXOL 300 MG/ML  SOLN

[Series 2: axial st · axial · 0.79mm/px · z∈[+1096,+1466]mm · 13 of 86 slices shown, 15 images]
[im 6/86  soft-tissue]
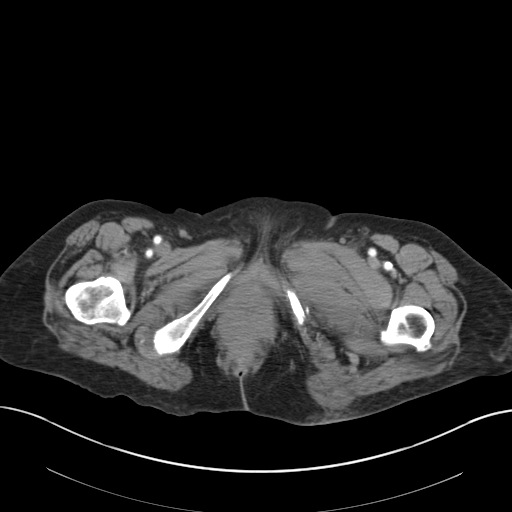
[im 6/86  bone]
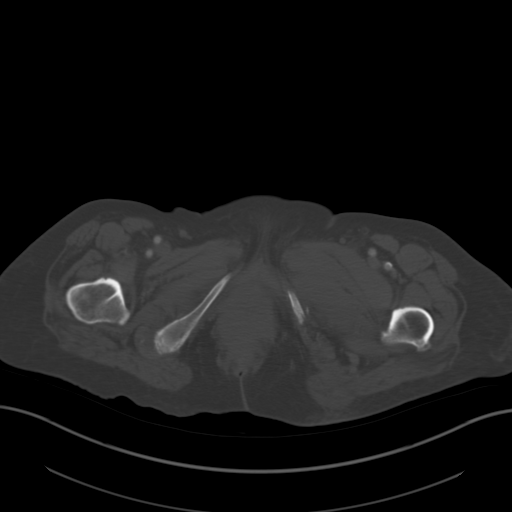
[im 12/86  soft-tissue]
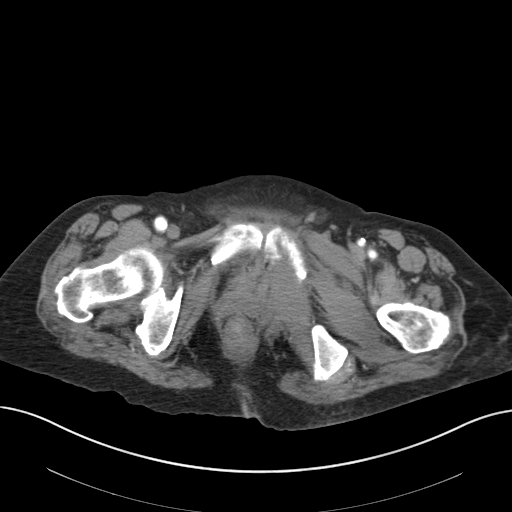
[im 18/86  soft-tissue]
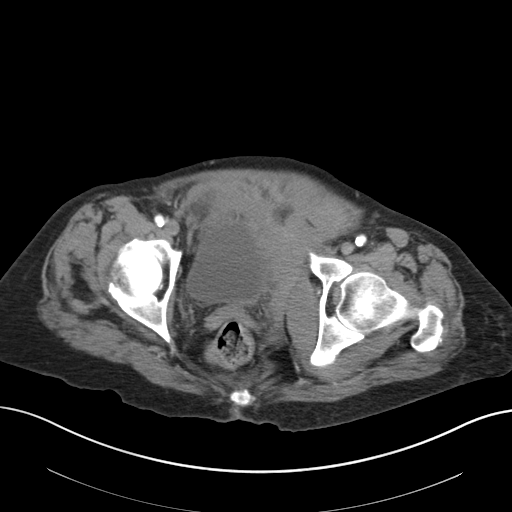
[im 23/86  soft-tissue]
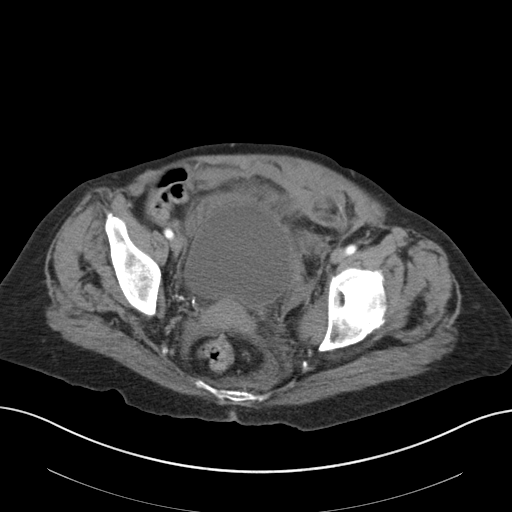
[im 29/86  soft-tissue]
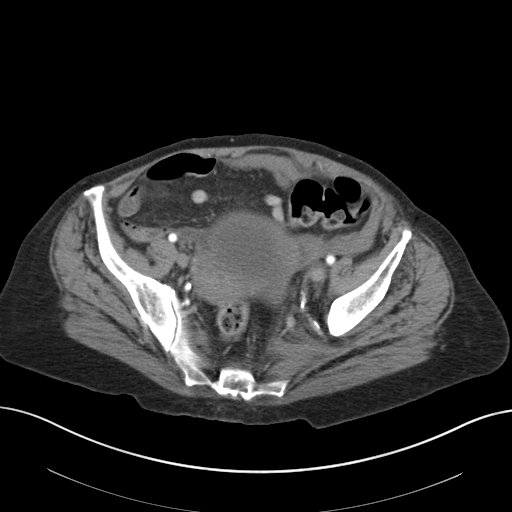
[im 35/86  soft-tissue]
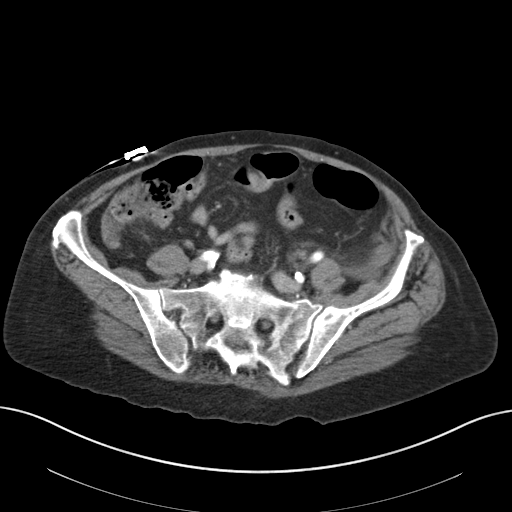
[im 46/86  soft-tissue]
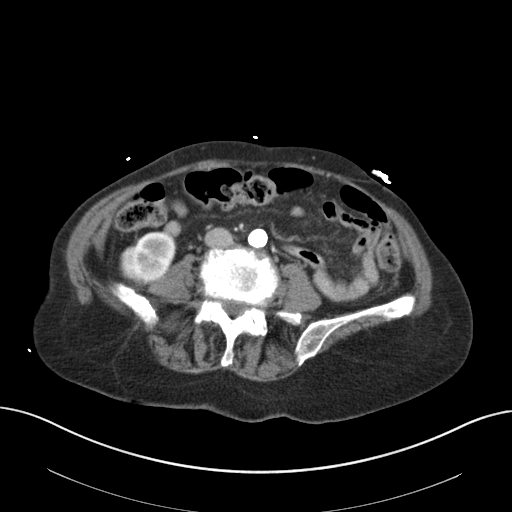
[im 52/86  soft-tissue]
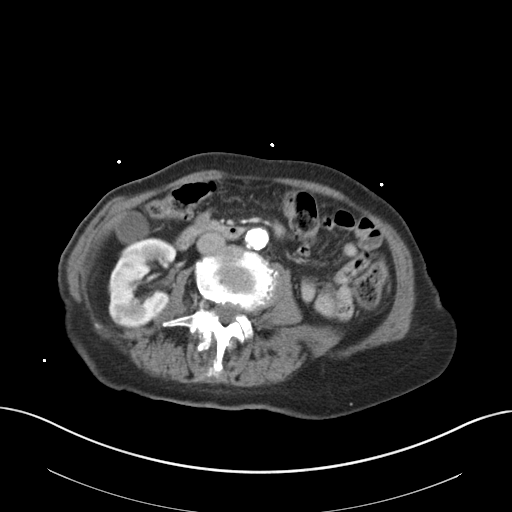
[im 57/86  soft-tissue]
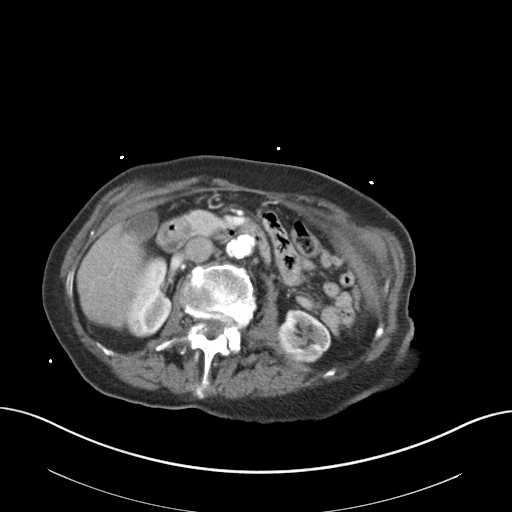
[im 57/86  bone]
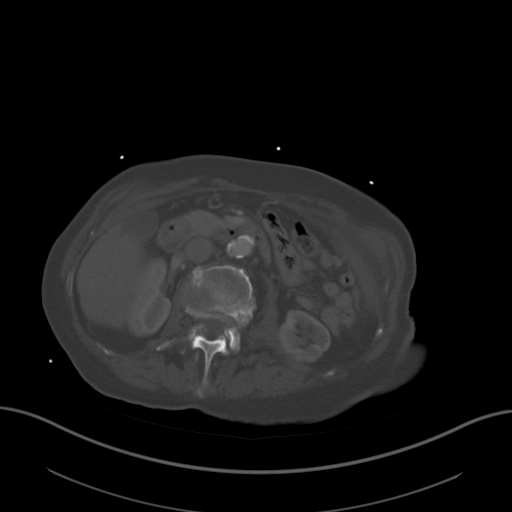
[im 63/86  soft-tissue]
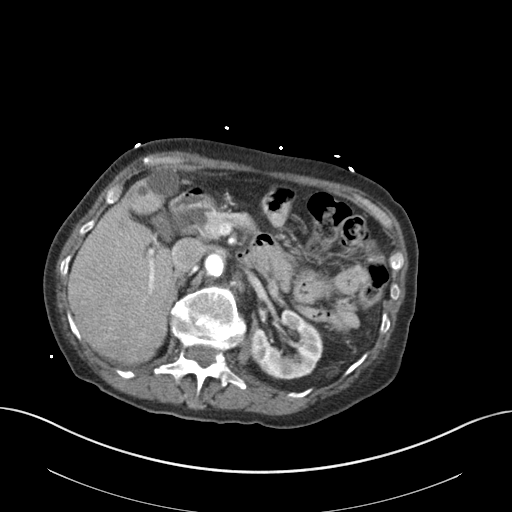
[im 69/86  soft-tissue]
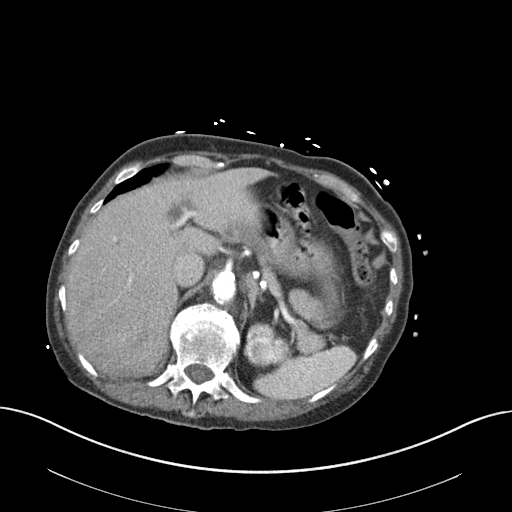
[im 74/86  soft-tissue]
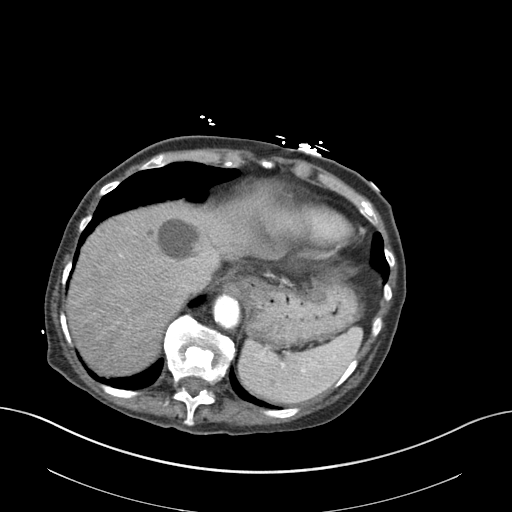
[im 80/86  soft-tissue]
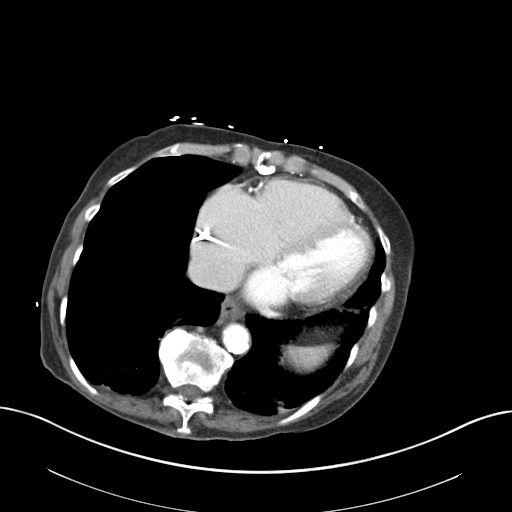

[Series 5: coronal st · coronal · 0.63mm/px · 3 of 142 slices shown]
[im 48/142  soft-tissue]
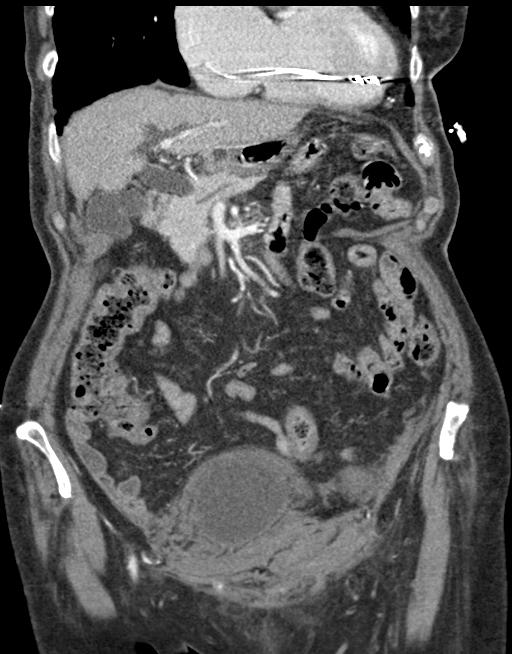
[im 63/142  soft-tissue]
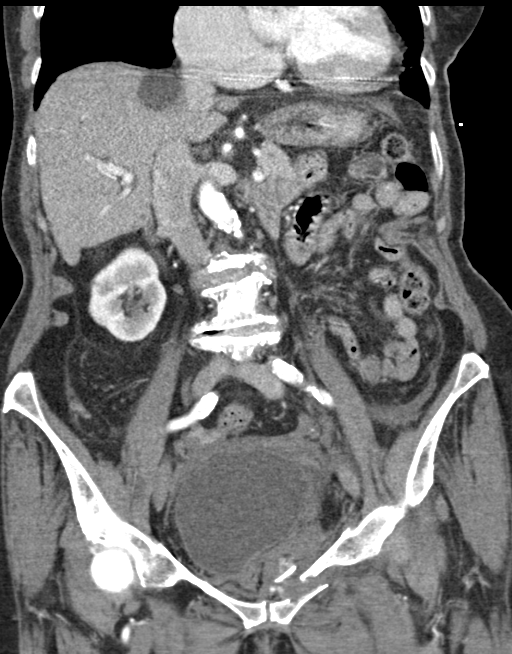
[im 79/142  soft-tissue]
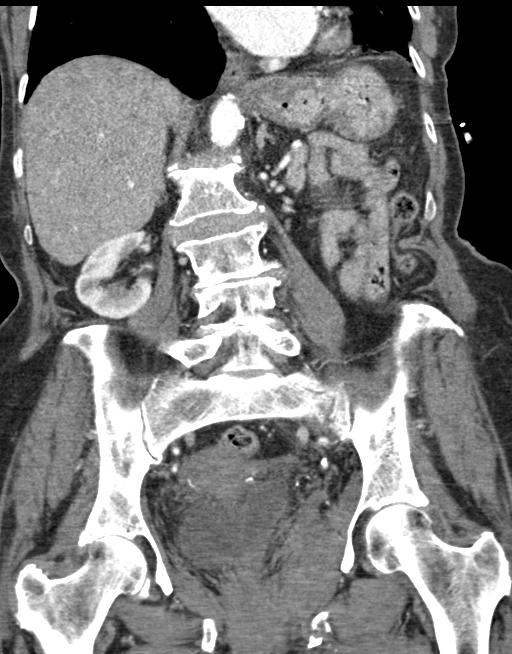

[16 of 46 positions shown; findings below may reference images not displayed]

FINDINGS: Lower chest: Mild scarring at the lung bases. Partially imaged
cardiomegaly.

Hepatobiliary: Small hepatic cysts and other too small to
characterize hypodense lesions. No hepatic injury or perihepatic
hematoma. Gallbladder is unremarkable. Prominence of the proximal
common bile duct with tapering.

Pancreas: Unremarkable apart from some atrophy.

Spleen: Too small to characterize hypodense lesion. No splenic
injury or perisplenic hematoma.

Adrenals/Urinary Tract: Bladder is displaced superiorly into the
right by hemorrhage. Integrity of the bladder is not evaluated on
this study. Small right renal cysts. Adrenals are unremarkable.

Stomach/Bowel: Stomach is within normal limits. Bowel is normal in
caliber.

Vascular/Lymphatic: Diffuse area iliac atherosclerosis. No enlarged
lymph nodes identified.

Reproductive: Uterus and bilateral adnexa are unremarkable.

Musculoskeletal: Left pelvic and sacral fractures as previously
described. Associated extraperitoneal hemorrhage along the ventral
abdominal wall and left pelvic sidewall extending into the left
paracolic gutter. No substantial change. Chronic appearing severe L1
compression fracture.
IMPRESSION: Pelvic and sacral fractures as described previously. Associated
extraperitoneal hemorrhage is not substantially changed.

No evidence of acute traumatic injury in the abdomen.

## 2020-08-17 IMAGING — CT CT HEAD CODE STROKE
3 series · 15 of 47 positions shown, 18 images · non-contrast
Comparison: None.

CLINICAL DATA: Code stroke.  Left-sided facial droop

EXAM:
CT HEAD WITHOUT CONTRAST
TECHNIQUE: Contiguous axial images were obtained from the base of the skull
through the vertex without intravenous contrast.

[Series 2: head wo · axial · 0.47mm/px · z∈[+1662,+1797]mm · 9 of 33 slices shown, 12 images]
[im 3/33  brain]
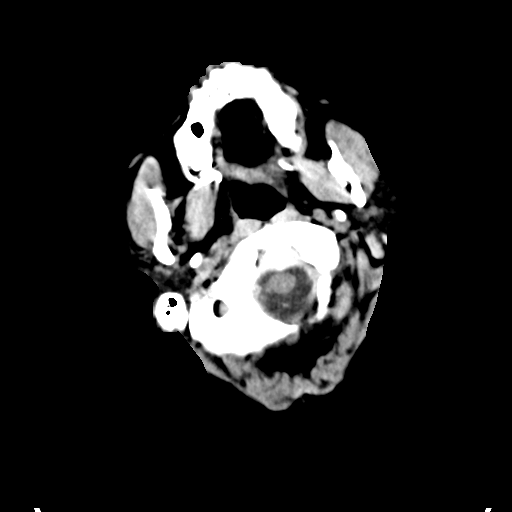
[im 3/33  bone]
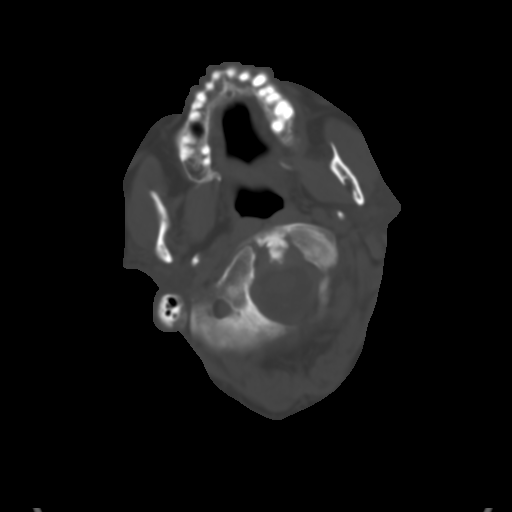
[im 6/33  brain]
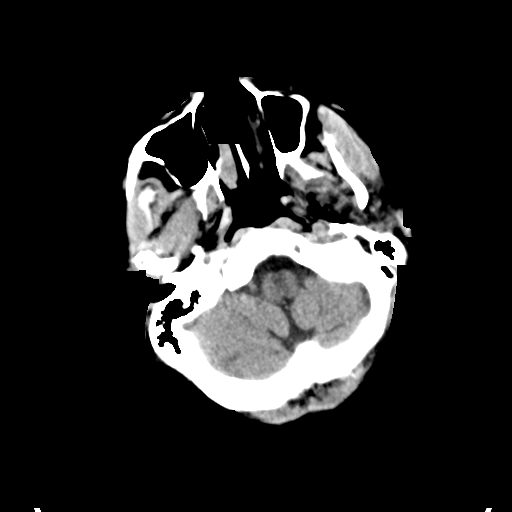
[im 9/33  brain]
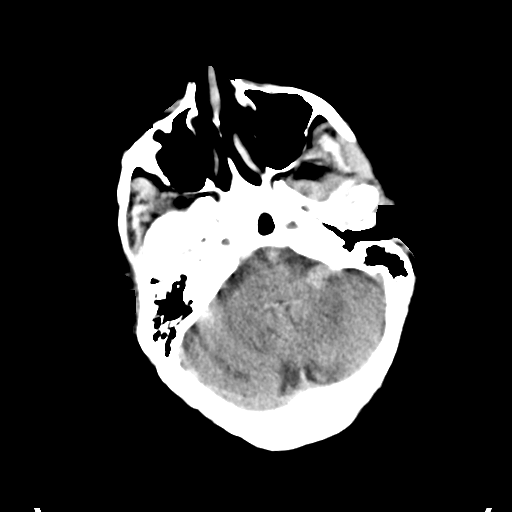
[im 13/33  brain]
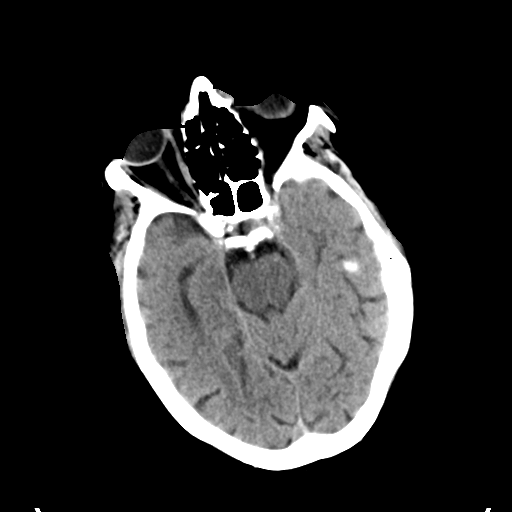
[im 17/33  brain]
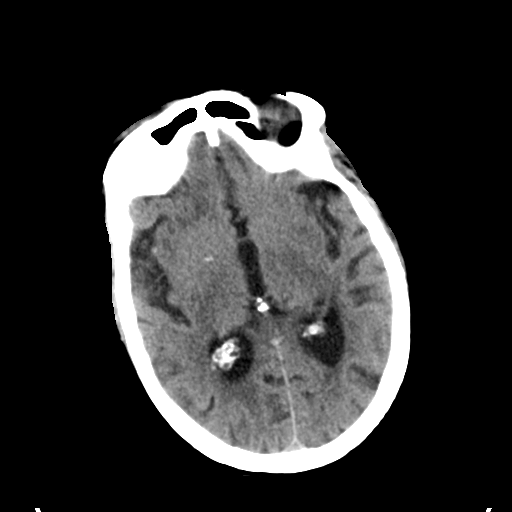
[im 17/33  bone]
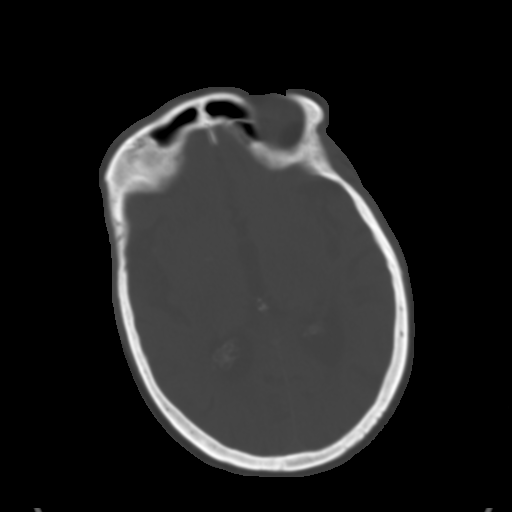
[im 20/33  brain]
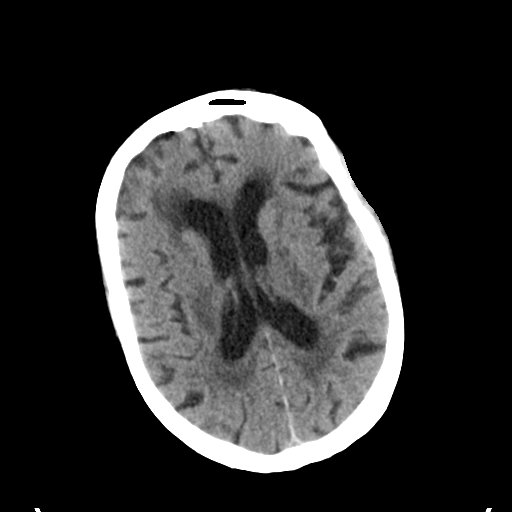
[im 24/33  brain]
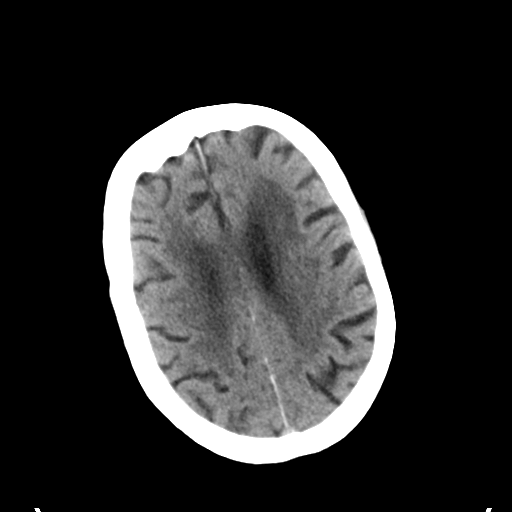
[im 27/33  brain]
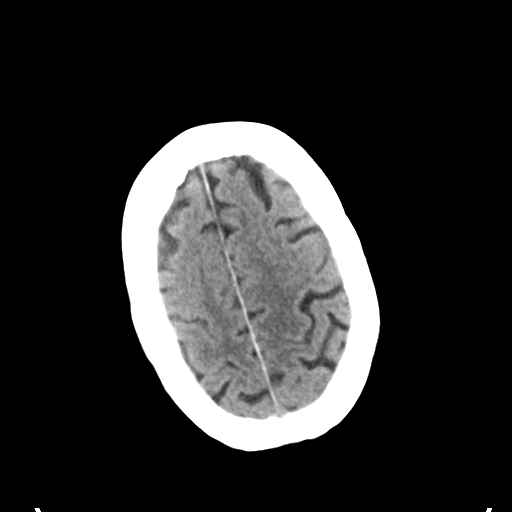
[im 30/33  brain]
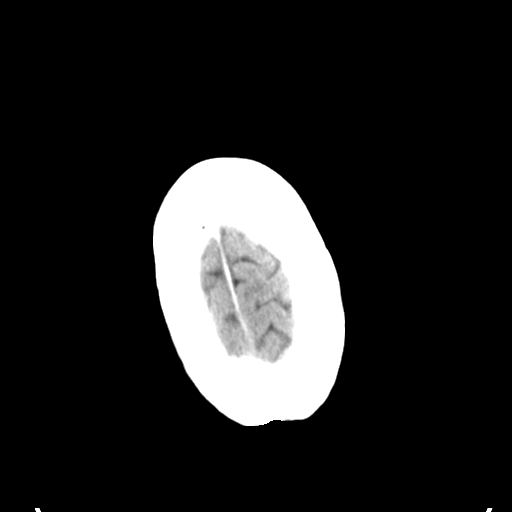
[im 30/33  bone]
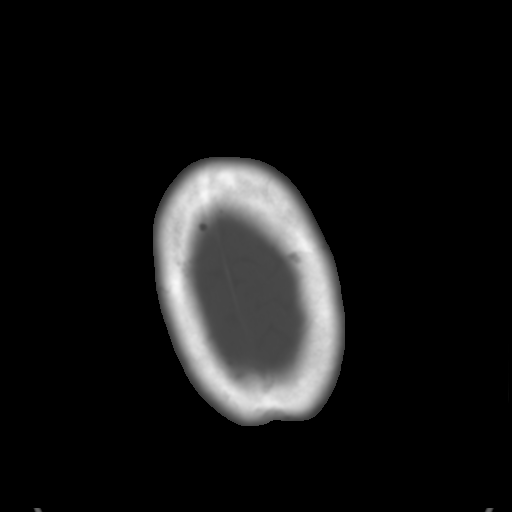

[Series 4: coronal soft tissue · coronal · 0.28mm/px · 3 of 67 slices shown]
[im 23/67  brain]
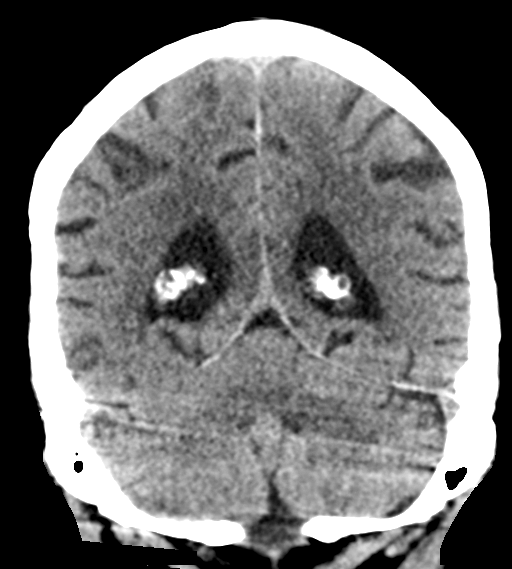
[im 30/67  brain]
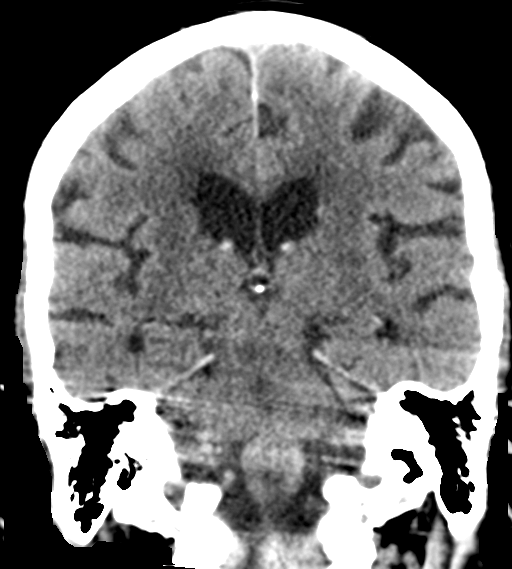
[im 37/67  brain]
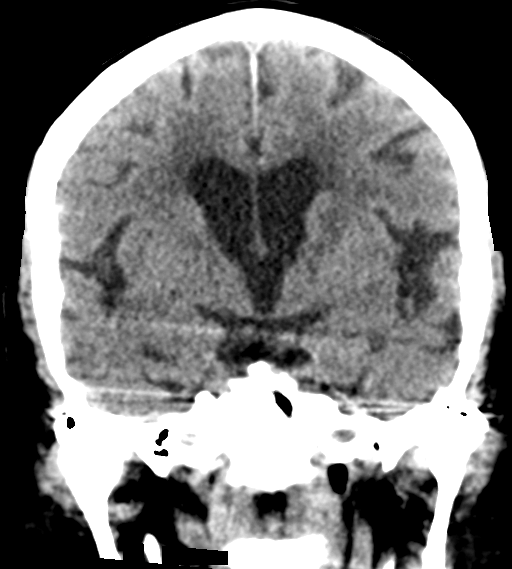

[Series 5: sagittal soft tissue · sagittal · 0.32mm/px · 3 of 48 slices shown]
[im 19/48  brain]
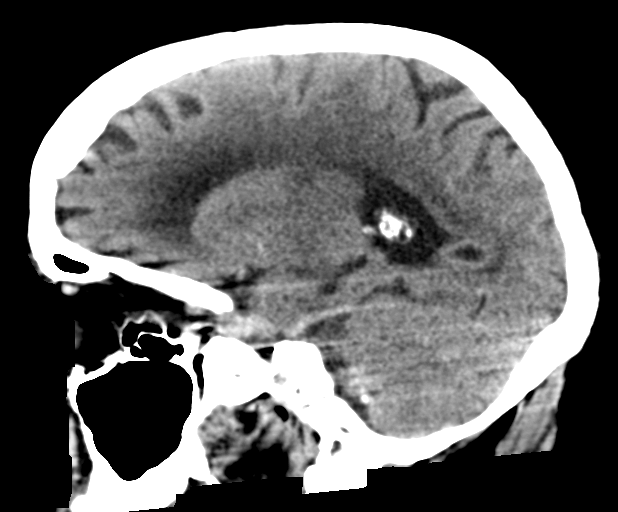
[im 24/48  brain]
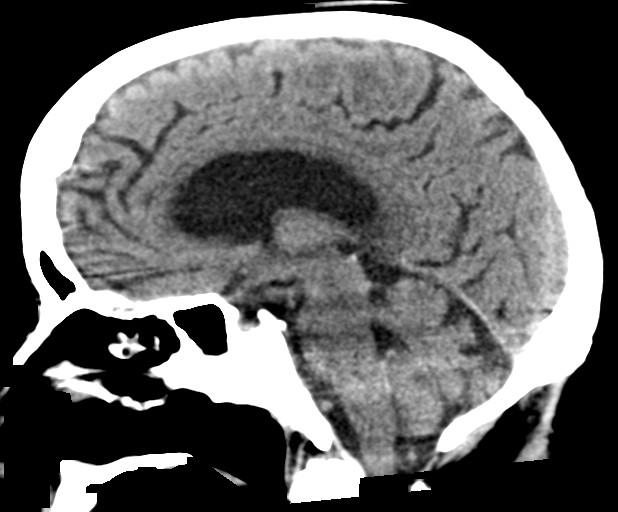
[im 29/48  brain]
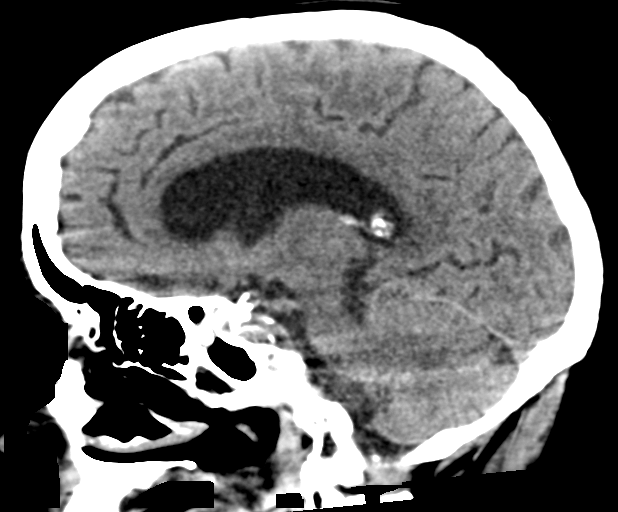

[15 of 47 positions shown; findings below may reference images not displayed]

FINDINGS: Brain: There is no mass, hemorrhage or extra-axial collection. There
is generalized atrophy without lobar predilection. There is
hypoattenuation of the periventricular white matter, most commonly
indicating chronic ischemic microangiopathy.

Vascular: No abnormal hyperdensity of the major intracranial
arteries or dural venous sinuses. No intracranial atherosclerosis.

Skull: The visualized skull base, calvarium and extracranial soft
tissues are normal.

Sinuses/Orbits: No fluid levels or advanced mucosal thickening of
the visualized paranasal sinuses. No mastoid or middle ear effusion.
The orbits are normal.

ASPECTS (Alberta Stroke Program Early CT Score)

- Ganglionic level infarction (caudate, lentiform nuclei, internal
capsule, insula, M1-M3 cortex): 7

- Supraganglionic infarction (M4-M6 cortex): 3

Total score (0-10 with 10 being normal): 10
IMPRESSION: 1. No acute intracranial abnormality.
2. ASPECTS is 10.
3. Severe chronic ischemic changes of the white matter.

These results were called by telephone at the time of interpretation
on [DATE] at [DATE] to provider TRISHAUNA , who verbally
acknowledged these results.

## 2020-08-17 IMAGING — DX DG CHEST 1V PORT
1 series · 1 of 1 positions shown · non-contrast
Comparison: Radiograph [DATE]

CLINICAL DATA: Left hip and elbow pain post fall

EXAM:
PORTABLE CHEST 1 VIEW

[chest ap]
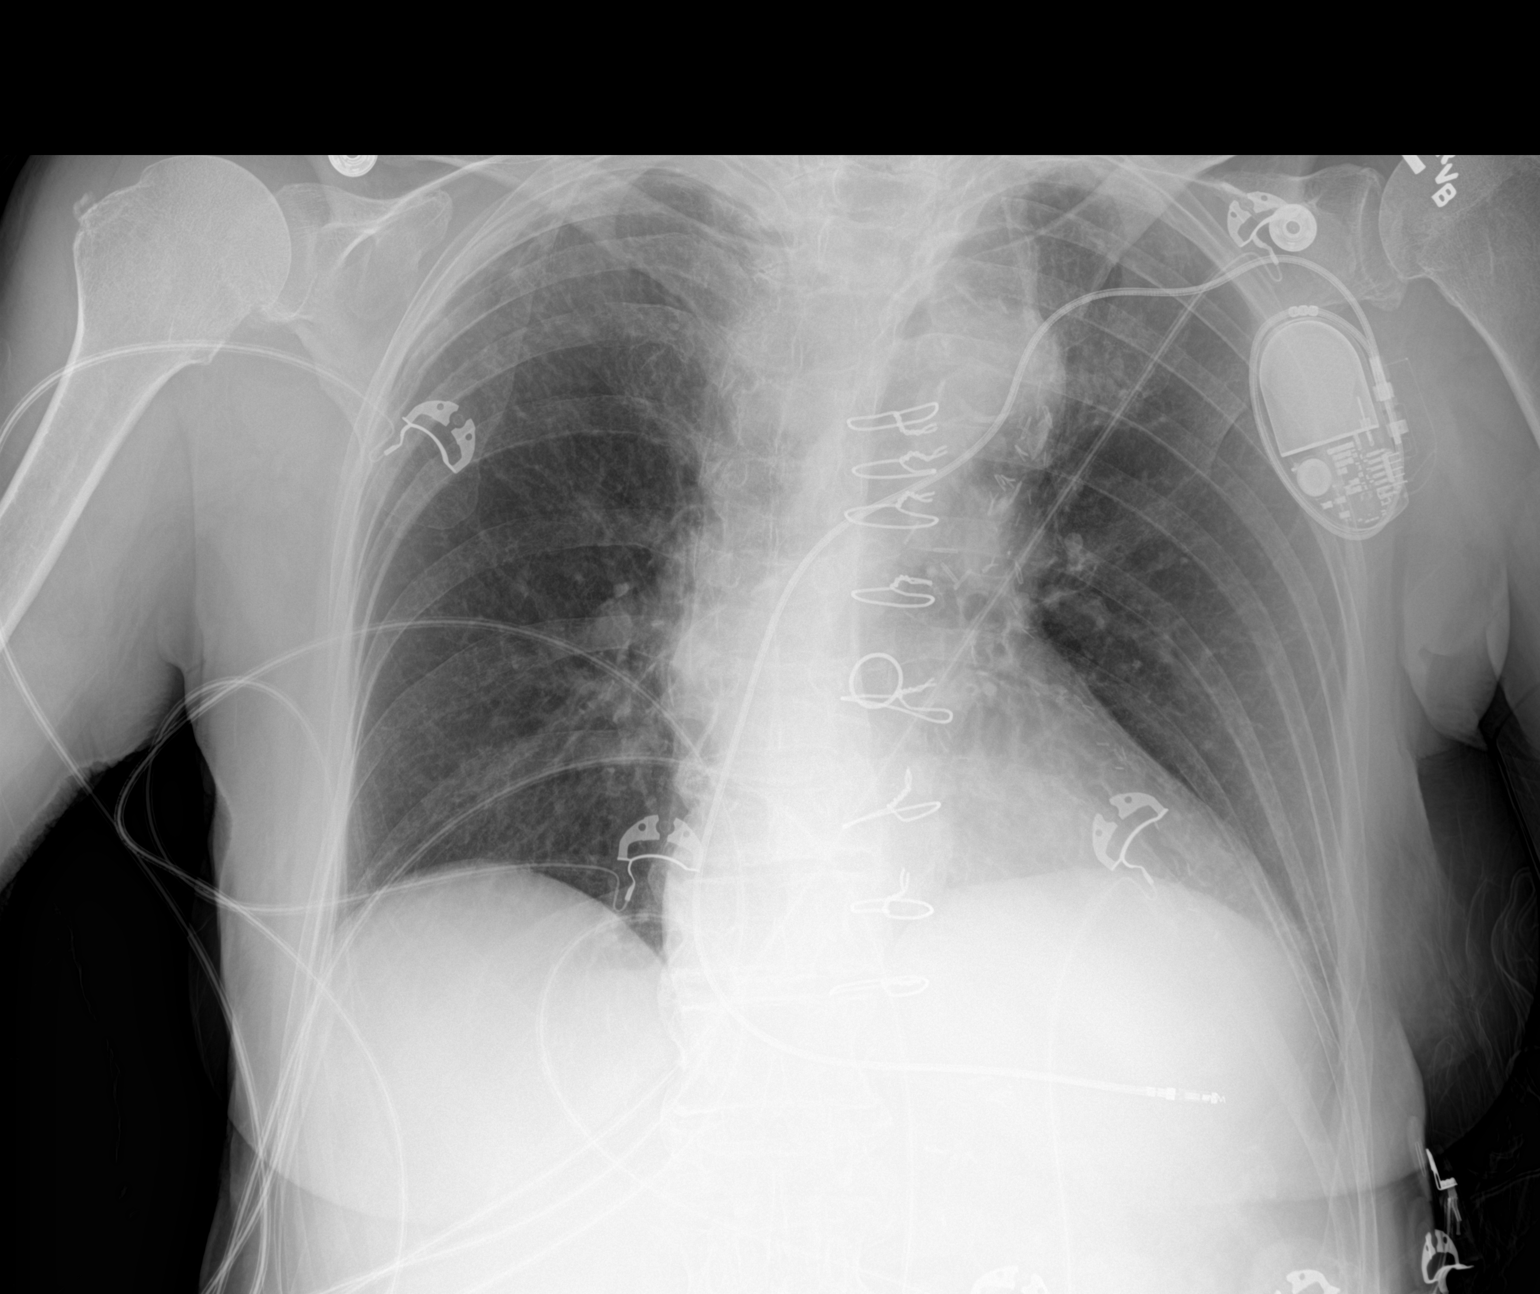

[1 of 1 positions shown; findings below may reference images not displayed]

FINDINGS: Low lung volumes with some atelectatic changes. Chronic biapical
pleuroparenchymal scarring and coarsened reticular changes are
similar to comparison exams. Postsurgical changes related to prior
CABG including intact and aligned sternotomy wires and multiple
surgical clips projecting over the mediastinum. Prominent cardiac
silhouette likely accentuated by the portable technique with a
calcified aorta. Pacer pack overlies the left chest wall with single
lead at the cardiac apex similar to prior. No acute osseous or soft
tissue abnormality. Degenerative changes are present in the imaged
spine and shoulders. Features of right shoulder calcific
tendinosis/hydroxyapatite deposition.
IMPRESSION: No acute cardiopulmonary abnormality. No acute traumatic findings in
the chest.

Aortic Atherosclerosis ([41]-[41]).

Prior sternotomy and CABG. Well seated pacer lead in stable
position.

## 2020-08-17 MED ORDER — ATENOLOL 25 MG PO TABS
50.0000 mg | ORAL_TABLET | Freq: Two times a day (BID) | ORAL | Status: DC
  Administered 2020-08-17: 50 mg via ORAL
  Filled 2020-08-17 (×2): qty 1

## 2020-08-17 MED ORDER — SODIUM CHLORIDE 0.9 % IV SOLN: Freq: Once | INTRAVENOUS | Status: AC

## 2020-08-17 MED ORDER — DIPHENHYDRAMINE HCL 50 MG/ML IJ SOLN
50.0000 mg | Freq: Once | INTRAMUSCULAR | Status: AC
  Administered 2020-08-17: 50 mg via INTRAVENOUS
  Filled 2020-08-17: qty 1

## 2020-08-17 MED ORDER — SODIUM CHLORIDE 0.45 % IV SOLN: INTRAVENOUS | Status: DC

## 2020-08-17 MED ORDER — DIPHENHYDRAMINE HCL 25 MG PO CAPS: 50.0000 mg | ORAL_CAPSULE | Freq: Once | ORAL | Status: AC

## 2020-08-17 MED ORDER — FENTANYL CITRATE (PF) 100 MCG/2ML IJ SOLN
25.0000 ug | Freq: Once | INTRAMUSCULAR | Status: AC
  Administered 2020-08-17: 25 ug via INTRAVENOUS
  Filled 2020-08-17: qty 2

## 2020-08-17 MED ORDER — LORAZEPAM 1 MG PO TABS
1.0000 mg | ORAL_TABLET | Freq: Every day | ORAL | Status: DC
  Administered 2020-08-17 – 2020-08-23 (×7): 1 mg via ORAL
  Filled 2020-08-17 (×7): qty 1

## 2020-08-17 MED ORDER — ACETAMINOPHEN 500 MG PO TABS
1000.0000 mg | ORAL_TABLET | Freq: Four times a day (QID) | ORAL | Status: DC | PRN
  Administered 2020-08-17: 1000 mg via ORAL
  Filled 2020-08-17: qty 2

## 2020-08-17 MED ORDER — DIGOXIN 125 MCG PO TABS: 0.1250 mg | ORAL_TABLET | ORAL | Status: DC

## 2020-08-17 MED ORDER — ONDANSETRON HCL 4 MG/2ML IJ SOLN: 4.0000 mg | Freq: Four times a day (QID) | INTRAMUSCULAR | Status: DC | PRN

## 2020-08-17 MED ORDER — IOHEXOL 300 MG/ML  SOLN
100.0000 mL | Freq: Once | INTRAMUSCULAR | Status: AC | PRN
  Administered 2020-08-17: 100 mL via INTRAVENOUS

## 2020-08-17 MED ORDER — HYDROCORTISONE NA SUCCINATE PF 250 MG IJ SOLR
200.0000 mg | Freq: Once | INTRAMUSCULAR | Status: AC
  Administered 2020-08-17: 200 mg via INTRAVENOUS
  Filled 2020-08-17: qty 200

## 2020-08-17 MED ORDER — CHLORHEXIDINE GLUCONATE CLOTH 2 % EX PADS
6.0000 | MEDICATED_PAD | Freq: Every day | CUTANEOUS | Status: DC
  Administered 2020-08-18 – 2020-08-22 (×5): 6 via TOPICAL

## 2020-08-17 MED ORDER — SODIUM CHLORIDE (PF) 0.9 % IJ SOLN
INTRAMUSCULAR | Status: AC
  Filled 2020-08-17: qty 50

## 2020-08-17 MED ORDER — ONDANSETRON HCL 4 MG PO TABS: 4.0000 mg | ORAL_TABLET | Freq: Four times a day (QID) | ORAL | Status: DC | PRN

## 2020-08-17 MED ORDER — SODIUM CHLORIDE 0.9 % IV SOLN: INTRAVENOUS | Status: DC

## 2020-08-17 MED ORDER — CALCIUM CARBONATE-VITAMIN D 500-200 MG-UNIT PO TABS
1.0000 | ORAL_TABLET | Freq: Every day | ORAL | Status: DC
  Administered 2020-08-17 – 2020-08-23 (×6): 1 via ORAL
  Filled 2020-08-17 (×8): qty 1

## 2020-08-17 NOTE — H&P (Signed)
History and Physical    Susan Conway TKZ:[id number] DOB: Jan 20, 1929 DOA: 08/17/2020  PCP: [name], DO  Patient coming from: Home  Chief Complaint: Fall and left hip pain  HPI: Susan Conway is a 86 y.o. female with medical history significant of a fib. Presenting after fall this morning. Patient fell in bathroom at 0430hrs today. She walked to the bathroom without issue. She was in her socks only. She turned around to sit on the commode but fell on the floor instead. She states the socks were slippery on the floor, and so she slipped. She remembers the entire fall. No head injury or LOC. She tried to get up, but she was unable to due to pain. She then hit her medical alert bracelet. No frequent falls per her account.   ED Course: CT showed pelvic and sacral fractures w/ extraperitoneal hemorrhage. Trauma and orthopedics were called by EDP. They declined admission. [facility] was called for admission.   Review of Systems:  Denies CP, palpitations, dyspnea, syncope prior to fail. Review of systems is otherwise negative for all not mentioned in HPI.   PMHx Past Medical History:  Diagnosis Date  . Actinic keratosis 05/25/2014  . Anxiety   . Atrial fibrillation ([place]) 09/21/2010  . Basal cell carcinoma 05/25/2014   Multiple removed by Dr. Danella Sensing in March 2015: right neck, left neck, scalp   . Bell's palsy 07/18/1979  . Cervicalgia 01/31/2012  . Closed fracture of lumbar vertebra without mention of spinal cord injury 07/17/2005  . Conjunctiva disorder 12/26/2010  . Coronary atherosclerosis of native coronary artery 07/17/1997  . Cramp of limb 08/16/2009  . Degeneration of lumbar or lumbosacral intervertebral disc 01/31/2012  . Disturbance of skin sensation 08/2009  . Dizziness and giddiness 11/08/2011   vertigo  . External hemorrhoids without mention of complication 5/73/2202  . External hemorrhoids without mention of complication 54/27/0623  . Ganglion of tendon sheath 08/16/2009  . Leg  cramp 10/27/2013   Most frequently the right leg.   . Long term (current) use of anticoagulants 09/2010  . Lumbago 07/2009  . Meralgia paresthetica 07/2007  . MI, old   . Myalgia and myositis, unspecified 01/17/2012  . Osteoporosis   . Other abnormal blood chemistry 04/08/2012  . Other abnormal blood chemistry 2013   hyperglycemia  . Other disorder of muscle, ligament, and fascia 04/08/2012  . Other specified cardiac dysrhythmias(427.89) 06/27/2010  . Pain in joint, ankle and foot 10/27/2013   Bilateral since 1998   . Pain in joint, shoulder region 08/12/2012  . Pain in joint, upper arm 12/26/2010  . Pain in limb 01/11/2011  . Pain in thoracic spine 01/31/2012  . 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 PVC's (premature ventricular contractions)   . Rash and other nonspecific skin eruption 08/02/2011  . Senile osteoporosis 07/17/1993  . Unspecified essential hypertension 07/17/1997  . Varicose veins of lower extremities 08/16/2009  . Varicose veins of lower extremities 08/16/2009    PSHx Past Surgical History:  Procedure Laterality Date  . COLONOSCOPY WITH PROPOFOL N/A 10/19/2015   Procedure: COLONOSCOPY WITH PROPOFOL;  Surgeon: Gatha Mayer, MD;  Location: [facility];  Service: Endoscopy;  Laterality: N/A;  . CORONARY ARTERY BYPASS GRAFT  1998   x2; LIMA to LAD; SVG to diagonal off bypass  . HEMORRHOID SURGERY  08/26/2012   Dr. Brantley Stage  . LOOP RECORDER INSERTION N/A 05/13/2018   Procedure: LOOP RECORDER INSERTION;  Surgeon: [name], MD;  Location: [facility]  INVASIVE CV LAB;  Service: Cardiovascular;  Laterality: N/A;  . LOOP RECORDER REMOVAL N/A 05/26/2019   Procedure: LOOP RECORDER REMOVAL;  Surgeon: [name], MD;  Location: [place] CV LAB;  Service: Cardiovascular;  Laterality: N/A;  . MASS EXCISION Left 11/23/2015   Procedure: LEFT WRIST EXCISION CYST;  Surgeon: Leanora Cover, MD;  Location: [facility];  Service: Orthopedics;  Laterality: Left;  .  PACEMAKER IMPLANT N/A 05/26/2019   Procedure: PACEMAKER IMPLANT;  Surgeon: [name], MD;  Location: [place] CV LAB;  Service: Cardiovascular;  Laterality: N/A;  . SKIN BIOPSY  01/29/14   (R) neck; (R) scalp, 2 (L) neck; shave biopsy Superficial basal cell carcinoma Dr. Danella Sensing  . TONSILLECTOMY  1940's    SocHx  reports that she has never smoked. She has never used smokeless tobacco. She reports current alcohol use. She reports that she does not use drugs.  Allergies  Allergen Reactions  . Iodinated Diagnostic Agents Nausea Only and Other (See Comments)    Severe nausea and also passed out  . Bactrim Rash  . Relafen [Nabumetone] Rash  . Sulfanilamide Rash    FamHx Family History  Problem Relation Age of Onset  . Heart attack Father 50  . Heart disease Father   . Hypertension Sister   . Heart disease Sister   . Heart disease Brother     Prior to Admission medications   Medication Sig Start Date End Date Taking? Authorizing Provider  acetaminophen (TYLENOL) 500 MG tablet Take 1,000 mg by mouth every 6 (six) hours as needed for mild pain (pain.).    Yes [provider]  atenolol (TENORMIN) 50 MG tablet Take 1 tablet (50 mg total) by mouth 2 (two) times daily. 06/25/20 09/23/20 Yes [name], MD  calcium-vitamin D (OSCAL WITH D) 500-200 MG-UNIT tablet Take 1 tablet by mouth daily.   Yes [provider]  digoxin (LANOXIN) 0.125 MG tablet TAKE 1 TABLET EVERY OTHER  DAY Patient taking differently: Take 0.125 mg by mouth every other day.  03/17/20  Yes [name], MD  LORazepam (ATIVAN) 1 MG tablet TAKE 1 TABLET AT BEDTIME TO DECREASE ANXIETY AND HELP WITH SLEEP Patient taking differently: Take 1 mg by mouth at bedtime.  04/27/20  Yes [name], DO    Physical Exam: Vitals:   08/17/20 1030 08/17/20 1100 08/17/20 1130 08/17/20 1350  BP: (!) 151/80 (!) 165/84  (!) 173/96  Pulse: 81 92 89 98  Resp: 14 16 20  (!) 24  Temp:      TempSrc:       SpO2: 96% 94% 96% 97%  Weight:      Height:        General: 86 y.o. female resting in bed in NAD Eyes: PERRL, normal sclera ENMT: Nares patent w/o discharge, orophaynx clear, dentition normal, ears w/o discharge/lesions/ulcers Neck: Supple, trachea midline Cardiovascular: RRR, +S1, S2, no m/g/r, equal pulses throughout Respiratory: CTABL, no w/r/r, normal WOB GI: BS+, NDNT, no masses noted, no organomegaly noted MSK: No e/c/c; left hip pain to palpation, LUE bandaging, left post rib contusions Skin: No rashes, bruises, ulcerations noted Neuro: A&O x 3, no focal deficits Psyc: Appropriate interaction and affect, calm/cooperative  Labs on Admission: I have personally reviewed following labs and imaging studies  CBC: Recent Labs  Lab 08/17/20 0731  WBC 15.5*  NEUTROABS 11.9*  HGB 11.2*  HCT 35.0*  MCV 92.3  PLT 84*   Basic Metabolic Panel:  Recent Labs  Lab 08/17/20 0731  NA 142  K 3.7  CL 105  CO2 24  GLUCOSE 112*  BUN 24*  CREATININE 0.89  CALCIUM 9.3   GFR: Estimated Creatinine Clearance: 40.1 mL/min (by C-G formula based on SCr of 0.89 mg/dL). Liver Function Tests: No results for input(s): AST, ALT, ALKPHOS, BILITOT, PROT, ALBUMIN in the last 168 hours. No results for input(s): LIPASE, AMYLASE in the last 168 hours. No results for input(s): AMMONIA in the last 168 hours. Coagulation Profile: No results for input(s): INR, PROTIME in the last 168 hours. Cardiac Enzymes: No results for input(s): CKTOTAL, CKMB, CKMBINDEX, TROPONINI in the last 168 hours. BNP (last 3 results) No results for input(s): PROBNP in the last 8760 hours. HbA1C: No results for input(s): HGBA1C in the last 72 hours. CBG: No results for input(s): GLUCAP in the last 168 hours. Lipid Profile: No results for input(s): CHOL, HDL, LDLCALC, TRIG, CHOLHDL, LDLDIRECT in the last 72 hours. Thyroid Function Tests: No results for input(s): TSH, T4TOTAL, FREET4, T3FREE, THYROIDAB in the last 72  hours. Anemia Panel: No results for input(s): VITAMINB12, FOLATE, FERRITIN, TIBC, IRON, RETICCTPCT in the last 72 hours. Urine analysis:    Component Value Date/Time   COLORURINE YELLOW 08/17/2020 0731   APPEARANCEUR CLEAR 08/17/2020 0731   LABSPEC 1.011 08/17/2020 0731   PHURINE 5.0 08/17/2020 0731   GLUCOSEU NEGATIVE 08/17/2020 0731   HGBUR SMALL (A) 08/17/2020 0731   BILIRUBINUR NEGATIVE 08/17/2020 0731   KETONESUR NEGATIVE 08/17/2020 0731   PROTEINUR NEGATIVE 08/17/2020 0731   NITRITE NEGATIVE 08/17/2020 0731   LEUKOCYTESUR NEGATIVE 08/17/2020 0731    Radiological Exams on Admission: CT PELVIS WO CONTRAST  Result Date: 08/17/2020 CLINICAL DATA:  Fall with left hip pain. EXAM: CT PELVIS WITHOUT CONTRAST TECHNIQUE: Multidetector CT imaging of the pelvis was performed following the standard protocol without intravenous contrast. COMPARISON:  None. FINDINGS: Urinary Tract: Mild bladder wall thickening noted with incomplete bladder distention. Bowel:  Unremarkable visualized pelvic bowel loops. Vascular/Lymphatic: Atherosclerotic calcification noted distal aorta and common iliac arteries. No pelvic sidewall lymphadenopathy. Reproductive:  Unremarkable. Other:  No intraperitoneal free fluid. Musculoskeletal: Comminuted fracture of the left superior and inferior pubic rami. Superior ramus fracture extends into the left pubic bone. There is extensive extraperitoneal hemorrhage in the left pelvic floor extending into the anterior abdominal wall and displacing the bladder cranially and to the right. There is an associated fracture of the left sacrum extending into the SI joint without diastasis. IMPRESSION: 1. Comminuted fracture of the left superior and inferior pubic rami with extension into the left pubic bone. 2. Associated fracture of the left sacrum extending into the SI joint without diastasis. 3. Extensive extraperitoneal hemorrhage in the left anterior pelvic floor extending into the  anterior abdominal wall and displacing the bladder cranially and to the right. 4. Aortic Atherosclerosis (ICD10-I70.0). Electronically Signed   By: Misty Stanley M.D.   On: 08/17/2020 07:12   CT ABDOMEN PELVIS W CONTRAST  Result Date: 08/17/2020 CLINICAL DATA:  Pelvic fractures with hemorrhage EXAM: CT ABDOMEN AND PELVIS WITH CONTRAST TECHNIQUE: Multidetector CT imaging of the abdomen and pelvis was performed using the standard protocol following bolus administration of intravenous contrast. CONTRAST:  137mL OMNIPAQUE IOHEXOL 300 MG/ML  SOLN COMPARISON:  Pelvis CT earlier same day FINDINGS: Lower chest: Mild scarring at the lung bases. Partially imaged cardiomegaly. Hepatobiliary: Small hepatic cysts and other too small to characterize hypodense lesions. No hepatic injury or perihepatic hematoma. Gallbladder is unremarkable. Prominence  of the proximal common bile duct with tapering. Pancreas: Unremarkable apart from some atrophy. Spleen: Too small to characterize hypodense lesion. No splenic injury or perisplenic hematoma. Adrenals/Urinary Tract: Bladder is displaced superiorly into the right by hemorrhage. Integrity of the bladder is not evaluated on this study. Small right renal cysts. Adrenals are unremarkable. Stomach/Bowel: Stomach is within normal limits. Bowel is normal in caliber. Vascular/Lymphatic: Diffuse area iliac atherosclerosis. No enlarged lymph nodes identified. Reproductive: Uterus and bilateral adnexa are unremarkable. Musculoskeletal: Left pelvic and sacral fractures as previously described. Associated extraperitoneal hemorrhage along the ventral abdominal wall and left pelvic sidewall extending into the left paracolic gutter. No substantial change. Chronic appearing severe L1 compression fracture. IMPRESSION: Pelvic and sacral fractures as described previously. Associated extraperitoneal hemorrhage is not substantially changed. No evidence of acute traumatic injury in the abdomen.  Electronically Signed   By: Macy Mis M.D.   On: 08/17/2020 13:09    EKG: Independently reviewed. A fib  Assessment/Plan Fall Left pelvic, sacral fractures     - admit to inpatient, med-surg     - trauma has reviewed; no immediate trauma needs     - awaiting ortho review     - PRN pain control     - SCDs for now d/t sacral hematoma     - will need PT/OT     - CXR neg for rib fractures  LUE pain after fall     - LUE films negative fro fractures     - PRN pain control  A fib     - continue atenolol and digoxin     - not on anticoag  Normocytic anemia Thrombocytopenia     - some loss to hematoma     - not sure why platelets are low, but this goes back into last month     - check peripheral smear  DVT prophylaxis: SCD until seen by ortho  Code Status: DNR  Family Communication: None at bedside  Consults called: EDP called truama and orhto  Admission status: Inpatient   Status is: Inpatient  Remains inpatient appropriate because:Ongoing diagnostic testing needed not appropriate for outpatient work up   Dispo: The patient is from: ALF              Anticipated d/c is to: ALF              Anticipated d/c date is: 2 days              Patient currently is not medically stable to d/c.  Time spent coordinating admission: 75 minutes  [facility]  If 7PM-7AM, please contact night-coverage [email or web address]  08/17/2020, 2:17 PM

## 2020-08-17 NOTE — Consult Note (Signed)
[facility] Surgery Trauma Consult Note  Susan Conway [birth year]  [id number].    Requesting MD: Nat Christen, MD Chief Complaint/Reason for Consult: ground level fall HPI:  Susan Conway is a 86 y/o F who presented to the [place] ED via EMS after a ground level fall. Patient states she got out of bed this morning at 0430 to go to the bathroom. While she was turning around to go to the bathroom she slipped on the tile floor in her bathroom, landing on her left hip. She denies hitting her head. Denies LOC. Reports significant left hip pain and mild left elbow pain. Denies the use of blood thinning medications including aspirin. Requests something to drink. At baseline she live alone in an independent living facility, states her husband is deceased.   Medical problems include a.fib not currently on anticagulation, HTN, HLD, CAD s/p CABG 1998, hemorrhagic baker's cyst R knee, thrombocytopenia, and RLE ulcer.   ROS: Review of Systems  HENT: Negative.   Eyes: Negative for blurred vision.  Respiratory: Negative.   Cardiovascular: Negative.   Gastrointestinal: Negative.   Genitourinary: Negative.   Musculoskeletal: Positive for back pain, falls and joint pain.  Neurological: Negative.  Negative for sensory change, speech change, focal weakness, seizures, loss of consciousness and weakness.  Endo/Heme/Allergies: Bruises/bleeds easily.  Psychiatric/Behavioral: Negative.   All other systems reviewed and are negative.   Family History  Problem Relation Age of Onset  . Heart attack Father 62  . Heart disease Father   . Hypertension Sister   . Heart disease Sister   . Heart disease Brother     Past Medical History:  Diagnosis Date  . Actinic keratosis 05/25/2014  . Anxiety   . Atrial fibrillation ([place]) 09/21/2010  . Basal cell carcinoma 05/25/2014   Multiple removed by Dr. Danella Sensing in March 2015: right neck, left neck, scalp   . Bell's palsy 07/18/1979  . Cervicalgia 01/31/2012   . Closed fracture of lumbar vertebra without mention of spinal cord injury 07/17/2005  . Conjunctiva disorder 12/26/2010  . Coronary atherosclerosis of native coronary artery 07/17/1997  . Cramp of limb 08/16/2009  . Degeneration of lumbar or lumbosacral intervertebral disc 01/31/2012  . Disturbance of skin sensation 08/2009  . Dizziness and giddiness 11/08/2011   vertigo  . External hemorrhoids without mention of complication 9/39/0300  . External hemorrhoids without mention of complication 92/33/0076  . Ganglion of tendon sheath 08/16/2009  . Leg cramp 10/27/2013   Most frequently the right leg.   . Long term (current) use of anticoagulants 09/2010  . Lumbago 07/2009  . Meralgia paresthetica 07/2007  . MI, old   . Myalgia and myositis, unspecified 01/17/2012  . Osteoporosis   . Other abnormal blood chemistry 04/08/2012  . Other abnormal blood chemistry 2013   hyperglycemia  . Other disorder of muscle, ligament, and fascia 04/08/2012  . Other specified cardiac dysrhythmias(427.89) 06/27/2010  . Pain in joint, ankle and foot 10/27/2013   Bilateral since 1998   . Pain in joint, shoulder region 08/12/2012  . Pain in joint, upper arm 12/26/2010  . Pain in limb 01/11/2011  . Pain in thoracic spine 01/31/2012  . 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 PVC's (premature ventricular contractions)   . Rash and other nonspecific skin eruption 08/02/2011  . Senile osteoporosis 07/17/1993  . Unspecified essential hypertension 07/17/1997  . Varicose veins of lower extremities 08/16/2009  . Varicose veins of lower extremities 08/16/2009    Past Surgical History:  Procedure Laterality Date  . COLONOSCOPY WITH PROPOFOL N/A 10/19/2015   Procedure: COLONOSCOPY WITH PROPOFOL;  Surgeon: Gatha Mayer, MD;  Location: [facility];  Service: Endoscopy;  Laterality: N/A;  . CORONARY ARTERY BYPASS GRAFT  1998   x2; LIMA to LAD; SVG to diagonal off bypass  . HEMORRHOID SURGERY  08/26/2012   Dr. Brantley Stage  .  LOOP RECORDER INSERTION N/A 05/13/2018   Procedure: LOOP RECORDER INSERTION;  Surgeon: [name], MD;  Location: [facility];  Service: Cardiovascular;  Laterality: N/A;  . LOOP RECORDER REMOVAL N/A 05/26/2019   Procedure: LOOP RECORDER REMOVAL;  Surgeon: [name], MD;  Location: [facility];  Service: Cardiovascular;  Laterality: N/A;  . MASS EXCISION Left 11/23/2015   Procedure: LEFT WRIST EXCISION CYST;  Surgeon: Leanora Cover, MD;  Location: [facility];  Service: Orthopedics;  Laterality: Left;  . PACEMAKER IMPLANT N/A 05/26/2019   Procedure: PACEMAKER IMPLANT;  Surgeon: [name], MD;  Location: [facility];  Service: Cardiovascular;  Laterality: N/A;  . SKIN BIOPSY  01/29/14   (R) neck; (R) scalp, 2 (L) neck; shave biopsy Superficial basal cell carcinoma Dr. Danella Sensing  . TONSILLECTOMY  1940's    Social History:  reports that she has never smoked. She has never used smokeless tobacco. She reports current alcohol use. She reports that she does not use drugs.  Allergies:  Allergies  Allergen Reactions  . Iodinated Diagnostic Agents Nausea Only and Other (See Comments)    Severe nausea and also passed out  . Bactrim Rash  . Relafen [Nabumetone] Rash  . Sulfanilamide Rash    (Not in a hospital admission)   Blood pressure (!) 173/96, pulse 98, temperature 98.2 F (36.8 C), temperature source Oral, resp. rate (!) 24, height 5\' 6"  (1.676 m), weight 61.2 kg, SpO2 97 %. Physical Exam: Constitutional: NAD; conversant; appears younger than stated age Eyes: Moist conjunctiva; no lid lag; anicteric; PERRL Neck: Trachea midline; no thyromegaly; no point tenderness over c-spine, no neck pain with neck rotation or flexion/extension Lungs: Normal respiratory effort; no tactile fremitus, CTAB, non-tender chest wall, there is a contusion and abrasion over left posterior chest wall. CV: RRR; no palpable thrills; no pitting edema; pedal pulses 2+  bilaterally GI: Abd soft; mild distention, +BS, non-tender no palpable hepatosplenomegaly MSK: symmetrical; moving all extremities; no clubbing/cyanosis; abrasion to left elbow covered with gauze, ecchymosis L wrist present, AROM wrist and elbow in tact. Psychiatric: Appropriate affect; alert and oriented x3 Neuro: no focal deficits, moving all extremities, appropriate speech, grip strength 5/5 bilaterally  Results for orders placed or performed during the hospital encounter of 08/17/20 (from the past 48 hour(s))  Respiratory Panel by RT PCR (Flu A&B, Covid) - Nasopharyngeal Swab     Status: None   Collection Time: 08/17/20  7:07 AM   Specimen: Nasopharyngeal Swab  Result Value Ref Range   SARS Coronavirus 2 by RT PCR NEGATIVE NEGATIVE    Comment: (NOTE) SARS-CoV-2 target nucleic acids are NOT DETECTED.  The SARS-CoV-2 RNA is generally detectable in upper respiratoy specimens during the acute phase of infection. The lowest concentration of SARS-CoV-2 viral copies this assay can detect is 131 copies/mL. A negative result does not preclude SARS-Cov-2 infection and should not be used as the sole basis for treatment or other patient management decisions. A negative result may occur with  improper specimen collection/handling, submission of specimen other than nasopharyngeal swab, presence of viral mutation(s) within  the areas targeted by this assay, and inadequate number of viral copies (<131 copies/mL). A negative result must be combined with clinical observations, patient history, and epidemiological information. The expected result is Negative.  Fact Sheet for Patients:  PinkCheek.be  Fact Sheet for Healthcare Providers:  GravelBags.it  This test is no t yet approved or cleared by the [place] FDA and  has been authorized for detection and/or diagnosis of SARS-CoV-2 by FDA under an Emergency Use Authorization (EUA). This  EUA will remain  in effect (meaning this test can be used) for the duration of the COVID-19 declaration under Section 564(b)(1) of the Act, 21 U.S.C. section 360bbb-3(b)(1), unless the authorization is terminated or revoked sooner.     Influenza A by PCR NEGATIVE NEGATIVE   Influenza B by PCR NEGATIVE NEGATIVE    Comment: (NOTE) The Xpert Xpress SARS-CoV-2/FLU/RSV assay is intended as an aid in  the diagnosis of influenza from Nasopharyngeal swab specimens and  should not be used as a sole basis for treatment. Nasal washings and  aspirates are unacceptable for Xpert Xpress SARS-CoV-2/FLU/RSV  testing.  Fact Sheet for Patients: PinkCheek.be  Fact Sheet for Healthcare Providers: GravelBags.it  This test is not yet approved or cleared by the [place] FDA and  has been authorized for detection and/or diagnosis of SARS-CoV-2 by  FDA under an Emergency Use Authorization (EUA). This EUA will remain  in effect (meaning this test can be used) for the duration of the  Covid-19 declaration under Section 564(b)(1) of the Act, 21  U.S.C. section 360bbb-3(b)(1), unless the authorization is  terminated or revoked. Performed at [facility], [place] [address] [place], [place] [address]   CBC with Differential/Platelet     Status: Abnormal   Collection Time: 08/17/20  7:31 AM  Result Value Ref Range   WBC 15.5 (H) 4.0 - 10.5 K/uL   RBC 3.79 (L) 3.87 - 5.11 MIL/uL   Hemoglobin 11.2 (L) 12.0 - 15.0 g/dL   HCT 35.0 (L) 36 - 46 %   MCV 92.3 80.0 - 100.0 fL   MCH 29.6 26.0 - 34.0 pg   MCHC 32.0 30.0 - 36.0 g/dL   RDW 14.6 11.5 - 15.5 %   Platelets 84 (L) 150 - 400 K/uL    Comment: REPEATED TO VERIFY PLATELET COUNT CONFIRMED BY SMEAR SPECIMEN CHECKED FOR CLOTS Immature Platelet Fraction may be clinically indicated, consider ordering this additional test [id number]    nRBC 0.0 0.0 - 0.2 %   Neutrophils Relative  % 77 %   Neutro Abs 11.9 (H) 1.7 - 7.7 K/uL   Lymphocytes Relative 6 %   Lymphs Abs 1.0 0.7 - 4.0 K/uL   Monocytes Relative 13 %   Monocytes Absolute 2.0 (H) 0.1 - 1.0 K/uL   Eosinophils Relative 0 %   Eosinophils Absolute 0.0 0.0 - 0.5 K/uL   Basophils Relative 0 %   Basophils Absolute 0.1 0.0 - 0.1 K/uL   Immature Granulocytes 4 %   Abs Immature Granulocytes 0.54 (H) 0.00 - 0.07 K/uL    Comment: Performed at [facility], [place] [address], [place], [place] [address]  Basic metabolic panel     Status: Abnormal   Collection Time: 08/17/20  7:31 AM  Result Value Ref Range   Sodium 142 135 - 145 mmol/L   Potassium 3.7 3.5 - 5.1 mmol/L   Chloride 105 98 - 111 mmol/L   CO2 24 22 - 32 mmol/L   Glucose, Bld  112 (H) 70 - 99 mg/dL    Comment: Glucose reference range applies only to samples taken after fasting for at least 8 hours.   BUN 24 (H) 8 - 23 mg/dL   Creatinine, Ser 0.89 0.44 - 1.00 mg/dL   Calcium 9.3 8.9 - 10.3 mg/dL   GFR, Estimated 57 (L) >60 mL/min   Anion gap 13 5 - 15    Comment: Performed at [facility], [place] [address], [place], [place] [address]  Urinalysis, Routine w reflex microscopic Urine, Clean Catch     Status: Abnormal   Collection Time: 08/17/20  7:31 AM  Result Value Ref Range   Color, Urine YELLOW YELLOW   APPearance CLEAR CLEAR   Specific Gravity, Urine 1.011 1.005 - 1.030   pH 5.0 5.0 - 8.0   Glucose, UA NEGATIVE NEGATIVE mg/dL   Hgb urine dipstick SMALL (A) NEGATIVE   Bilirubin Urine NEGATIVE NEGATIVE   Ketones, ur NEGATIVE NEGATIVE mg/dL   Protein, ur NEGATIVE NEGATIVE mg/dL   Nitrite NEGATIVE NEGATIVE   Leukocytes,Ua NEGATIVE NEGATIVE   RBC / HPF 0-5 0 - 5 RBC/hpf   WBC, UA 0-5 0 - 5 WBC/hpf   Bacteria, UA RARE (A) NONE SEEN    Comment: Performed at [facility], [place] [address] [place], [place] [address]   CT PELVIS WO CONTRAST  Result Date: 08/17/2020 CLINICAL DATA:  Fall with left hip  pain. EXAM: CT PELVIS WITHOUT CONTRAST TECHNIQUE: Multidetector CT imaging of the pelvis was performed following the standard protocol without intravenous contrast. COMPARISON:  None. FINDINGS: Urinary Tract: Mild bladder wall thickening noted with incomplete bladder distention. Bowel:  Unremarkable visualized pelvic bowel loops. Vascular/Lymphatic: Atherosclerotic calcification noted distal aorta and common iliac arteries. No pelvic sidewall lymphadenopathy. Reproductive:  Unremarkable. Other:  No intraperitoneal free fluid. Musculoskeletal: Comminuted fracture of the left superior and inferior pubic rami. Superior ramus fracture extends into the left pubic bone. There is extensive extraperitoneal hemorrhage in the left pelvic floor extending into the anterior abdominal wall and displacing the bladder cranially and to the right. There is an associated fracture of the left sacrum extending into the SI joint without diastasis. IMPRESSION: 1. Comminuted fracture of the left superior and inferior pubic rami with extension into the left pubic bone. 2. Associated fracture of the left sacrum extending into the SI joint without diastasis. 3. Extensive extraperitoneal hemorrhage in the left anterior pelvic floor extending into the anterior abdominal wall and displacing the bladder cranially and to the right. 4. Aortic Atherosclerosis (ICD10-I70.0). Electronically Signed   By: Misty Stanley M.D.   On: 08/17/2020 07:12   CT ABDOMEN PELVIS W CONTRAST  Result Date: 08/17/2020 CLINICAL DATA:  Pelvic fractures with hemorrhage EXAM: CT ABDOMEN AND PELVIS WITH CONTRAST TECHNIQUE: Multidetector CT imaging of the abdomen and pelvis was performed using the standard protocol following bolus administration of intravenous contrast. CONTRAST:  115mL OMNIPAQUE IOHEXOL 300 MG/ML  SOLN COMPARISON:  Pelvis CT earlier same day FINDINGS: Lower chest: Mild scarring at the lung bases. Partially imaged cardiomegaly. Hepatobiliary: Small  hepatic cysts and other too small to characterize hypodense lesions. No hepatic injury or perihepatic hematoma. Gallbladder is unremarkable. Prominence of the proximal common bile duct with tapering. Pancreas: Unremarkable apart from some atrophy. Spleen: Too small to characterize hypodense lesion. No splenic injury or perisplenic hematoma. Adrenals/Urinary Tract: Bladder is displaced superiorly into the right by hemorrhage. Integrity of the bladder is not evaluated on this study. Small right renal cysts. Adrenals  are unremarkable. Stomach/Bowel: Stomach is within normal limits. Bowel is normal in caliber. Vascular/Lymphatic: Diffuse area iliac atherosclerosis. No enlarged lymph nodes identified. Reproductive: Uterus and bilateral adnexa are unremarkable. Musculoskeletal: Left pelvic and sacral fractures as previously described. Associated extraperitoneal hemorrhage along the ventral abdominal wall and left pelvic sidewall extending into the left paracolic gutter. No substantial change. Chronic appearing severe L1 compression fracture. IMPRESSION: Pelvic and sacral fractures as described previously. Associated extraperitoneal hemorrhage is not substantially changed. No evidence of acute traumatic injury in the abdomen. Electronically Signed   By: Macy Mis M.D.   On: 08/17/2020 13:09   Assessment/Plan A.fib - not currently on anticoagulation HTN HLD CAD s/p CABG 1998 hemorrhagic baker's cyst R knee Thrombocytopenia RLE ulcer s/p skin biopsy  Ground level fall  Comminuted left inferior and superior pubic rami FX Left sacral FX Extraperitoneal pelvic hematoma   Susan Conway is a pleasant 86 year old female with multiple medical problems who sustained a ground level fall resulting in above pelvic fractures and pelvic hematoma. Orthopedic surgery consult is pending. She has remained alert and hemodynamically stable. Given fall with posterior chest wall contusion I will order chest x-ray to rule out rib  fractures. Plain films of the left wrist and elbow were ordered to rule out underlying fracture. Given multiple medical problems with suspected isolated orthopedic injury, recommend medical admission for observation, pain control, PT/OT, and follow up of orthopedic recommendations.  Trauma surgery will follow.  Jill Alexanders, PA-C [facility] Please see Amion for pager number during day hours 7:00am-4:30pm 08/17/2020, 2:16 PM

## 2020-08-17 NOTE — Plan of Care (Signed)
Plan of care discussed with Susan Conway and Patient.

## 2020-08-17 NOTE — ED Provider Notes (Signed)
[facility] DEPT Provider Note  CSN: [id number] Arrival date & time: 08/17/20 8546  Chief Complaint(s) Fall and Hip Pain  HPI Susan Conway is a 86 y.o. female with a history of atrial fibrillation not currently anticoagulated who lives in an independent living facility who presents to the emergency department with a mechanical fall resulting in left hip pain.  She reports slipping on the floor with her socks.  She reports landing on her buttocks.  No head trauma or loss of consciousness.  No back pain.  No chest pain or shortness of breath.  No abdominal pain.  No other extremity pain.  Patient reports being unable to stand due the exacerbation of her pain.  Pain is relieved with immobility.  Currently 1 out of 10 pain with lying still.  HPI  Past Medical History Past Medical History:  Diagnosis Date  . Actinic keratosis 05/25/2014  . Anxiety   . Atrial fibrillation ([place]) 09/21/2010  . Basal cell carcinoma 05/25/2014   Multiple removed by Dr. Danella Sensing in March 2015: right neck, left neck, scalp   . Bell's palsy 07/18/1979  . Cervicalgia 01/31/2012  . Closed fracture of lumbar vertebra without mention of spinal cord injury 07/17/2005  . Conjunctiva disorder 12/26/2010  . Coronary atherosclerosis of native coronary artery 07/17/1997  . Cramp of limb 08/16/2009  . Degeneration of lumbar or lumbosacral intervertebral disc 01/31/2012  . Disturbance of skin sensation 08/2009  . Dizziness and giddiness 11/08/2011   vertigo  . External hemorrhoids without mention of complication 2/70/3500  . External hemorrhoids without mention of complication 93/81/8299  . Ganglion of tendon sheath 08/16/2009  . Leg cramp 10/27/2013   Most frequently the right leg.   . Long term (current) use of anticoagulants 09/2010  . Lumbago 07/2009  . Meralgia paresthetica 07/2007  . MI, old   . Myalgia and myositis, unspecified 01/17/2012  . Osteoporosis   . Other abnormal blood  chemistry 04/08/2012  . Other abnormal blood chemistry 2013   hyperglycemia  . Other disorder of muscle, ligament, and fascia 04/08/2012  . Other specified cardiac dysrhythmias(427.89) 06/27/2010  . Pain in joint, ankle and foot 10/27/2013   Bilateral since 1998   . Pain in joint, shoulder region 08/12/2012  . Pain in joint, upper arm 12/26/2010  . Pain in limb 01/11/2011  . Pain in thoracic spine 01/31/2012  . 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 PVC's (premature ventricular contractions)   . Rash and other nonspecific skin eruption 08/02/2011  . Senile osteoporosis 07/17/1993  . Unspecified essential hypertension 07/17/1997  . Varicose veins of lower extremities 08/16/2009  . Varicose veins of lower extremities 08/16/2009   Patient Active Problem List   Diagnosis Date Noted  . Hemorrhage 03/04/2020  . Asymptomatic varicose veins of both lower extremities 01/29/2020  . Onychomycosis of left great toe 01/29/2020  . Xerosis cutis 01/29/2020  . Ventricular tachycardia ([place]) 10/22/2019  . Presence of cardiac pacemaker 10/22/2019  . 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 Gastroesophageal reflux disease without esophagitis 10/02/2018  . Primary osteoarthritis involving multiple joints 10/02/2018  . Hypercoagulable state due to atrial fibrillation ([place]) 10/02/2018  . Trigger middle finger of right hand 10/26/2017  . Ganglion, left wrist 11/12/2015  . Lower GI bleed   . Internal bleeding hemorrhoids   . Rectal ulcer   . Constipation   . Rectal bleeding 10/17/2015  . Actinic keratosis 05/25/2014  . Basal cell carcinoma 05/25/2014  . Leg cramp 10/27/2013  .  Tennis elbow 10/27/2013  . Pain in joint, ankle and foot 10/27/2013  . Pain in joint, shoulder region 06/21/2013  . Cervicalgia 06/21/2013  . Hyperglycemia 04/28/2013  . Hemorrhoids 09/16/2012  . Coronary artery disease involving native coronary artery of native heart without angina pectoris 09/12/2010  . Atrial  fibrillation ([place]) 09/07/2010  . Long term (current) use of anticoagulants 09/06/2010  . Hyperlipidemia 03/17/2010  . OLD MYOCARDIAL INFARCTION 03/16/2010  . PREMATURE VENTRICULAR CONTRACTIONS 03/16/2010  . Osteoporosis 05/13/2009  . Meralgia paresthetica 07/08/2007  . Essential hypertension 07/17/1997   Home Medication(s) Prior to Admission medications   Medication Sig Start Date End Date Taking? Authorizing Provider  acetaminophen (TYLENOL) 500 MG tablet Take 1,000 mg by mouth in the morning, at noon, and at bedtime.     [provider]  atenolol (TENORMIN) 50 MG tablet Take 1 tablet (50 mg total) by mouth 2 (two) times daily. 06/25/20 09/23/20  [name], MD  calcium-vitamin D (OSCAL WITH D) 500-200 MG-UNIT tablet Take 1 tablet by mouth daily.    [provider]  digoxin (LANOXIN) 0.125 MG tablet TAKE 1 TABLET EVERY OTHER  DAY 03/17/20   [name], MD  LORazepam (ATIVAN) 1 MG tablet TAKE 1 TABLET AT BEDTIME TO DECREASE ANXIETY AND HELP WITH SLEEP 04/27/20   [name], DO                                                                                                                                    Past Surgical History Past Surgical History:  Procedure Laterality Date  . COLONOSCOPY WITH PROPOFOL N/A 10/19/2015   Procedure: COLONOSCOPY WITH PROPOFOL;  Surgeon: Gatha Mayer, MD;  Location: [facility];  Service: Endoscopy;  Laterality: N/A;  . CORONARY ARTERY BYPASS GRAFT  1998   x2; LIMA to LAD; SVG to diagonal off bypass  . HEMORRHOID SURGERY  08/26/2012   Dr. Brantley Stage  . LOOP RECORDER INSERTION N/A 05/13/2018   Procedure: LOOP RECORDER INSERTION;  Surgeon: [name], MD;  Location: [facility];  Service: Cardiovascular;  Laterality: N/A;  . LOOP RECORDER REMOVAL N/A 05/26/2019   Procedure: LOOP RECORDER REMOVAL;  Surgeon: [name], MD;  Location: [facility];  Service: Cardiovascular;  Laterality: N/A;  . MASS EXCISION  Left 11/23/2015   Procedure: LEFT WRIST EXCISION CYST;  Surgeon: Leanora Cover, MD;  Location: [facility];  Service: Orthopedics;  Laterality: Left;  . PACEMAKER IMPLANT N/A 05/26/2019   Procedure: PACEMAKER IMPLANT;  Surgeon: [name], MD;  Location: [facility];  Service: Cardiovascular;  Laterality: N/A;  . SKIN BIOPSY  01/29/14   (R) neck; (R) scalp, 2 (L) neck; shave biopsy Superficial basal cell carcinoma Dr. Danella Sensing  . TONSILLECTOMY  1940's   Family History Family History  Problem Relation Age of Onset  . Heart attack Father 29  .  Heart disease Father   . Hypertension Sister   . Heart disease Sister   . Heart disease Brother     Social History Social History   Tobacco Use  . Smoking status: Never Smoker  . Smokeless tobacco: Never Used  Vaping Use  . Vaping Use: Never used  Substance Use Topics  . Alcohol use: Yes    Comment: occasional wine  . Drug use: No   Allergies Iodinated diagnostic agents, Bactrim, Relafen [nabumetone], and Sulfanilamide  Review of Systems Review of Systems All other systems are reviewed and are negative for acute change except as noted in the HPI  Physical Exam Vital Signs  I have reviewed the triage vital signs BP (!) 163/101 (BP Location: Left Arm)   Pulse 86   Temp 97.9 F (36.6 C) (Oral)   Resp 13   Ht 5\' 6"  (1.676 m)   Wt 61.2 kg   SpO2 97%   BMI 21.79 kg/m   Physical Exam Constitutional:      General: She is not in acute distress.    Appearance: She is well-developed. She is not diaphoretic.  HENT:     Head: Normocephalic and atraumatic.     Right Ear: External ear normal.     Left Ear: External ear normal.     Nose: Nose normal.  Eyes:     General: No scleral icterus.       Right eye: No discharge.        Left eye: No discharge.     Conjunctiva/sclera: Conjunctivae normal.     Pupils: Pupils are equal, round, and reactive to light.  Cardiovascular:     Rate and Rhythm: Normal rate  and regular rhythm.     Pulses:          Radial pulses are 2+ on the right side and 2+ on the left side.       Dorsalis pedis pulses are 2+ on the right side and 2+ on the left side.     Heart sounds: Normal heart sounds. No murmur heard.  No friction rub. No gallop.   Pulmonary:     Effort: Pulmonary effort is normal. No respiratory distress.     Breath sounds: Normal breath sounds. No stridor. No wheezing.  Abdominal:     General: There is no distension.     Palpations: Abdomen is soft.     Tenderness: There is no abdominal tenderness. There is no guarding or rebound.  Musculoskeletal:        General: No tenderness.     Cervical back: Normal range of motion and neck supple. No bony tenderness.     Thoracic back: No bony tenderness.     Lumbar back: No bony tenderness.     Right hip: No tenderness.     Left hip: Bony tenderness present. Normal range of motion. Normal strength.       Legs:     Comments: Clavicles stable. Chest stable to AP/Lat compression. Pelvis stable to Lat compression. No obvious extremity deformity. No chest or abdominal wall contusion.  Skin:    General: Skin is warm and dry.     Findings: No erythema or rash.  Neurological:     Mental Status: She is alert and oriented to person, place, and time.     Comments: Moving all extremities     ED Results and Treatments Labs (all labs ordered are listed, but only abnormal results are displayed) Labs Reviewed - No data to display  EKG  EKG Interpretation  Date/Time:  Tuesday August 17 2020 07:29:01 EDT Ventricular Rate:  89 PR Interval:    QRS Duration: 134 QT Interval:  389 QTC Calculation: 474 R Axis:   -83 Text Interpretation: Atrial fibrillation Right bundle branch block Inferior infarct, old Probable posterior infarct, acute No acute changes Confirmed by Addison Lank [phone]) on  08/17/2020 7:38:21 AM      Radiology CT PELVIS WO CONTRAST  Result Date: 08/17/2020 CLINICAL DATA:  Fall with left hip pain. EXAM: CT PELVIS WITHOUT CONTRAST TECHNIQUE: Multidetector CT imaging of the pelvis was performed following the standard protocol without intravenous contrast. COMPARISON:  None. FINDINGS: Urinary Tract: Mild bladder wall thickening noted with incomplete bladder distention. Bowel:  Unremarkable visualized pelvic bowel loops. Vascular/Lymphatic: Atherosclerotic calcification noted distal aorta and common iliac arteries. No pelvic sidewall lymphadenopathy. Reproductive:  Unremarkable. Other:  No intraperitoneal free fluid. Musculoskeletal: Comminuted fracture of the left superior and inferior pubic rami. Superior ramus fracture extends into the left pubic bone. There is extensive extraperitoneal hemorrhage in the left pelvic floor extending into the anterior abdominal wall and displacing the bladder cranially and to the right. There is an associated fracture of the left sacrum extending into the SI joint without diastasis. IMPRESSION: 1. Comminuted fracture of the left superior and inferior pubic rami with extension into the left pubic bone. 2. Associated fracture of the left sacrum extending into the SI joint without diastasis. 3. Extensive extraperitoneal hemorrhage in the left anterior pelvic floor extending into the anterior abdominal wall and displacing the bladder cranially and to the right. 4. Aortic Atherosclerosis (ICD10-I70.0). Electronically Signed   By: Misty Stanley M.D.   On: 08/17/2020 07:12    Pertinent labs & imaging results that were available during my care of the patient were reviewed by me and considered in my medical decision making (see chart for details).  Medications Ordered in ED Medications - No data to display                                                                                                                                  Procedures .1-3  Lead EKG Interpretation Performed by: Fatima Blank, MD Authorized by: Fatima Blank, MD     Interpretation: abnormal     ECG rate:  92   ECG rate assessment: normal     Rhythm: atrial fibrillation     Ectopy: none     Conduction: normal   .Critical Care Performed by: Fatima Blank, MD Authorized by: Fatima Blank, MD    CRITICAL CARE Performed by: Grayce Sessions Berl Bonfanti Total critical care time: 40 minutes Critical care time was exclusive of separately billable procedures and treating other patients. Critical care was necessary to treat or prevent imminent or life-threatening deterioration. Critical care was time spent personally by me on the following activities: development of treatment plan with patient and/or surrogate as well as nursing,  discussions with consultants, evaluation of patient's response to treatment, examination of patient, obtaining history from patient or surrogate, ordering and performing treatments and interventions, ordering and review of laboratory studies, ordering and review of radiographic studies, pulse oximetry and re-evaluation of patient's condition.    (including critical care time)  Medical Decision Making / ED Course I have reviewed the nursing notes for this encounter and the patient's prior records (if available in EHR or on provided paperwork).   Susan Conway was evaluated in Emergency Department on 08/17/2020 for the symptoms described in the history of present illness. She was evaluated in the context of the global COVID-19 pandemic, which necessitated consideration that the patient might be at risk for infection with the SARS-CoV-2 virus that causes COVID-19. Institutional protocols and algorithms that pertain to the evaluation of patients at risk for COVID-19 are in a state of rapid change based on information released by regulatory bodies including the CDC and federal and state organizations. These policies and  algorithms were followed during the patient's care in the ED.  Patient presents with mechanical fall resulting in buttock tenderness.  No tenderness over the left hip joint and patient has full range of motion with minimal discomfort.  Less suspicion for hip fracture.  More concern for pelvic fracture.  CT pelvis obtain and confirmed comminuted fracture of the superior and inferior pubic rami extending into the left sacrum.  In addition there is notable extraperitoneal hemorrhage.  Patient is currently hemodynamically stable.  No other injuries noted on exam. Patient's abdomen is benign.  We will expand the work-up to include labs and CT abdomen and pelvis with contrast.  Given her reported allergy patient will need to be premedicated for emergent imaging.  Patient care turned over to Dr Lacinda Axon. Patient case and results discussed in detail; please see their note for further ED managment.          Final Clinical Impression(s) / ED Diagnoses Final diagnoses:  Fall, initial encounter  Closed displaced fracture of left pubis, initial encounter ([place])  Closed fracture of sacrum, unspecified portion of sacrum, initial encounter ([place])  Hematoma of extraperitoneal space      This chart was dictated using voice recognition software.  Despite best efforts to proofread,  errors can occur which can change the documentation meaning.   Fatima Blank, MD 08/17/20 [phone]

## 2020-08-17 NOTE — Progress Notes (Signed)
Patient off floor to CT at 23:00. Phone call to daughter [name] to update her on the change in condition and events up to now.  She reports that Mom was on a blood thinner until a few months ago for A-Fib and was taken off when a Baker's cyst ruptured.  Does not know the name of the blood thinner, but offered that [facility] Nurse desk [phone] would know. Plans to call back in the morning for an update.

## 2020-08-17 NOTE — ED Triage Notes (Addendum)
Pt reports that she slipped on the bathroom tile this morning and landed on her L hip. Reports L hip pain. No head injury. A&Ox4. EMS reports some bruising to the L side of her back.

## 2020-08-17 NOTE — ED Notes (Signed)
Transport called to take patient upstairs 

## 2020-08-17 NOTE — ED Provider Notes (Signed)
1340: Discussed with Dr. Leana Gamer (orthopedic) and trauma PA on call.  Trauma service will perform consult.  Probable admission to orthopedics.   Nat Christen, MD 08/17/20 1340

## 2020-08-17 NOTE — Progress Notes (Signed)
Observed progressive weakness of left hand and arm and leg, observed tongue deviating to the left more when asked to stick her tongue out. Informed CN and called [facility] for evaluation.

## 2020-08-17 NOTE — ED Notes (Signed)
ED TO INPATIENT HANDOFF REPORT  Name/Age/Gender Susan Conway 86 y.o. female  Code Status    Code Status Orders  (From admission, onward)         Start     Ordered   08/17/20 1738  Do not attempt resuscitation (DNR)  Continuous       Question Answer Comment  In the event of cardiac or respiratory ARREST Do not call a "code blue"   In the event of cardiac or respiratory ARREST Do not perform Intubation, CPR, defibrillation or ACLS   In the event of cardiac or respiratory ARREST Use medication by any route, position, wound care, and other measures to relive pain and suffering. May use oxygen, suction and manual treatment of airway obstruction as needed for comfort.   Comments Confirmed with patient.      08/17/20 1737        Code Status History    Date Active Date Inactive Code Status Order ID Comments User Context   03/02/2020 1525 08/17/2020 0512 DNR [id number]  [name], DO Outpatient   05/26/2019 1212 05/26/2019 2040 Full Code [id number]  [name], MD Inpatient   11/10/2015 1105 05/29/2016 1424 DNR [id number]  Ripley Fraise, [place] [facility]   10/18/2015 0233 10/20/2015 1703 DNR [id number]  Theressa Millard, MD Inpatient   03/19/2013 1600 10/18/2015 0233 DNR [id number] MOST form completed 03/19/2013 Mardene Celeste, NP Outpatient   Advance Care Planning Activity    Advance Directive Documentation     Most Recent Value  Type of Advance Directive Out of facility DNR (pink MOST or yellow form)  Pre-existing out of facility DNR order (yellow form or pink MOST form) Yellow form placed in chart (order not valid for inpatient use)  "MOST" Form in Place? --      Home/SNF/Other Home  Chief Complaint Pelvic fracture ([place]) [S32.9XXA]  Level of Care/Admitting Diagnosis ED Disposition    ED Disposition Condition [facility]: [place] [[id number]]  Level of Care: Med-Surg [16]  May admit patient to [facility] or [facility] if  equivalent level of care is available:: No  Covid Evaluation: Confirmed COVID Negative  Diagnosis: Pelvic fracture [facility]) [[id number]]  Admitting Physician: Jonnie Finner [[id number]]  Attending Physician: Jonnie Finner [[id number]]  Estimated length of stay: past midnight tomorrow  Certification:: I certify this patient will need inpatient services for at least 2 midnights       Medical History Past Medical History:  Diagnosis Date  . Actinic keratosis 05/25/2014  . Anxiety   . Atrial fibrillation ([place]) 09/21/2010  . Basal cell carcinoma 05/25/2014   Multiple removed by Dr. Danella Sensing in March 2015: right neck, left neck, scalp   . Bell's palsy 07/18/1979  . Cervicalgia 01/31/2012  . Closed fracture of lumbar vertebra without mention of spinal cord injury 07/17/2005  . Conjunctiva disorder 12/26/2010  . Coronary atherosclerosis of native coronary artery 07/17/1997  . Cramp of limb 08/16/2009  . Degeneration of lumbar or lumbosacral intervertebral disc 01/31/2012  . Disturbance of skin sensation 08/2009  . Dizziness and giddiness 11/08/2011   vertigo  . External hemorrhoids without mention of complication 2/83/6629  . External hemorrhoids without mention of complication 47/65/4650  . Ganglion of tendon sheath 08/16/2009  . Leg cramp 10/27/2013   Most frequently the right leg.   . Long term (current) use of anticoagulants 09/2010  . Lumbago 07/2009  . Meralgia paresthetica 07/2007  .  MI, old   . Myalgia and myositis, unspecified 01/17/2012  . Osteoporosis   . Other abnormal blood chemistry 04/08/2012  . Other abnormal blood chemistry 2013   hyperglycemia  . Other disorder of muscle, ligament, and fascia 04/08/2012  . Other specified cardiac dysrhythmias(427.89) 06/27/2010  . Pain in joint, ankle and foot 10/27/2013   Bilateral since 1998   . Pain in joint, shoulder region 08/12/2012  . Pain in joint, upper arm 12/26/2010  . Pain in limb 01/11/2011  . Pain in thoracic spine 01/31/2012  .  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 PVC's (premature ventricular contractions)   . Rash and other nonspecific skin eruption 08/02/2011  . Senile osteoporosis 07/17/1993  . Unspecified essential hypertension 07/17/1997  . Varicose veins of lower extremities 08/16/2009  . Varicose veins of lower extremities 08/16/2009    Allergies Allergies  Allergen Reactions  . Iodinated Diagnostic Agents Nausea Only and Other (See Comments)    Severe nausea and also passed out  . Bactrim Rash  . Relafen [Nabumetone] Rash  . Sulfanilamide Rash    IV Location/Drains/Wounds Patient Lines/Drains/Airways Status    Active Line/Drains/Airways    Name Placement date Placement time Site Days   Peripheral IV 08/17/20 Right Forearm 08/17/20  0737  Forearm  less than 1   Incision (Closed) 11/23/15 Hand Left 11/23/15  1412   1729          Labs/Imaging Results for orders placed or performed during the hospital encounter of 08/17/20 (from the past 48 hour(s))  Respiratory Panel by RT PCR (Flu A&B, Covid) - Nasopharyngeal Swab     Status: None   Collection Time: 08/17/20  7:07 AM   Specimen: Nasopharyngeal Swab  Result Value Ref Range   SARS Coronavirus 2 by RT PCR NEGATIVE NEGATIVE    Comment: (NOTE) SARS-CoV-2 target nucleic acids are NOT DETECTED.  The SARS-CoV-2 RNA is generally detectable in upper respiratoy specimens during the acute phase of infection. The lowest concentration of SARS-CoV-2 viral copies this assay can detect is 131 copies/mL. A negative result does not preclude SARS-Cov-2 infection and should not be used as the sole basis for treatment or other patient management decisions. A negative result may occur with  improper specimen collection/handling, submission of specimen other than nasopharyngeal swab, presence of viral mutation(s) within the areas targeted by this assay, and inadequate number of viral copies (<131 copies/mL). A negative result must be combined with  clinical observations, patient history, and epidemiological information. The expected result is Negative.  Fact Sheet for Patients:  PinkCheek.be  Fact Sheet for Healthcare Providers:  GravelBags.it  This test is no t yet approved or cleared by the [place] FDA and  has been authorized for detection and/or diagnosis of SARS-CoV-2 by FDA under an Emergency Use Authorization (EUA). This EUA will remain  in effect (meaning this test can be used) for the duration of the COVID-19 declaration under Section 564(b)(1) of the Act, 21 U.S.C. section 360bbb-3(b)(1), unless the authorization is terminated or revoked sooner.     Influenza A by PCR NEGATIVE NEGATIVE   Influenza B by PCR NEGATIVE NEGATIVE    Comment: (NOTE) The Xpert Xpress SARS-CoV-2/FLU/RSV assay is intended as an aid in  the diagnosis of influenza from Nasopharyngeal swab specimens and  should not be used as a sole basis for treatment. Nasal washings and  aspirates are unacceptable for Xpert Xpress SARS-CoV-2/FLU/RSV  testing.  Fact Sheet for Patients: PinkCheek.be  Fact Sheet for Healthcare Providers: GravelBags.it  This test is not yet approved or cleared by the [place] and  has been authorized for detection and/or diagnosis of SARS-CoV-2 by  FDA under an Emergency Use Authorization (EUA). This EUA will remain  in effect (meaning this test can be used) for the duration of the  Covid-19 declaration under Section 564(b)(1) of the Act, 21  U.S.C. section 360bbb-3(b)(1), unless the authorization is  terminated or revoked. Performed at [facility], [place] [address], [place], [place] [address]   CBC with Differential/Platelet     Status: Abnormal   Collection Time: 08/17/20  7:31 AM  Result Value Ref Range   WBC 15.5 (H) 4.0 - 10.5 K/uL   RBC 3.79 (L) 3.87 - 5.11 MIL/uL    Hemoglobin 11.2 (L) 12.0 - 15.0 g/dL   HCT 35.0 (L) 36 - 46 %   MCV 92.3 80.0 - 100.0 fL   MCH 29.6 26.0 - 34.0 pg   MCHC 32.0 30.0 - 36.0 g/dL   RDW 14.6 11.5 - 15.5 %   Platelets 84 (L) 150 - 400 K/uL    Comment: REPEATED TO VERIFY PLATELET COUNT CONFIRMED BY SMEAR SPECIMEN CHECKED FOR CLOTS Immature Platelet Fraction may be clinically indicated, consider ordering this additional test [id number]    nRBC 0.0 0.0 - 0.2 %   Neutrophils Relative % 77 %   Neutro Abs 11.9 (H) 1.7 - 7.7 K/uL   Lymphocytes Relative 6 %   Lymphs Abs 1.0 0.7 - 4.0 K/uL   Monocytes Relative 13 %   Monocytes Absolute 2.0 (H) 0.1 - 1.0 K/uL   Eosinophils Relative 0 %   Eosinophils Absolute 0.0 0.0 - 0.5 K/uL   Basophils Relative 0 %   Basophils Absolute 0.1 0.0 - 0.1 K/uL   Immature Granulocytes 4 %   Abs Immature Granulocytes 0.54 (H) 0.00 - 0.07 K/uL    Comment: Performed at [facility], [place] [address] [place], [place] [address]  Basic metabolic panel     Status: Abnormal   Collection Time: 08/17/20  7:31 AM  Result Value Ref Range   Sodium 142 135 - 145 mmol/L   Potassium 3.7 3.5 - 5.1 mmol/L   Chloride 105 98 - 111 mmol/L   CO2 24 22 - 32 mmol/L   Glucose, Bld 112 (H) 70 - 99 mg/dL    Comment: Glucose reference range applies only to samples taken after fasting for at least 8 hours.   BUN 24 (H) 8 - 23 mg/dL   Creatinine, Ser 0.89 0.44 - 1.00 mg/dL   Calcium 9.3 8.9 - 10.3 mg/dL   GFR, Estimated 57 (L) >60 mL/min   Anion gap 13 5 - 15    Comment: Performed at [facility], [place] [address], [place], [place] [address]  Urinalysis, Routine w reflex microscopic Urine, Clean Catch     Status: Abnormal   Collection Time: 08/17/20  7:31 AM  Result Value Ref Range   Color, Urine YELLOW YELLOW   APPearance CLEAR CLEAR   Specific Gravity, Urine 1.011 1.005 - 1.030   pH 5.0 5.0 - 8.0   Glucose, UA NEGATIVE NEGATIVE mg/dL   Hgb urine dipstick SMALL (A) NEGATIVE    Bilirubin Urine NEGATIVE NEGATIVE   Ketones, ur NEGATIVE NEGATIVE mg/dL   Protein, ur NEGATIVE NEGATIVE mg/dL   Nitrite NEGATIVE NEGATIVE   Leukocytes,Ua NEGATIVE NEGATIVE   RBC / HPF 0-5 0 - 5 RBC/hpf   WBC, UA 0-5 0 - 5  WBC/hpf   Bacteria, UA RARE (A) NONE SEEN    Comment: Performed at [facility], [place] [address] [place], [place] [address]  Hepatic function panel     Status: Abnormal   Collection Time: 08/17/20  7:31 AM  Result Value Ref Range   Total Protein 6.5 6.5 - 8.1 g/dL   Albumin 3.7 3.5 - 5.0 g/dL   AST 32 15 - 41 U/L   ALT 24 0 - 44 U/L   Alkaline Phosphatase 62 38 - 126 U/L   Total Bilirubin 1.2 0.3 - 1.2 mg/dL   Bilirubin, Direct 0.2 0.0 - 0.2 mg/dL   Indirect Bilirubin 1.0 (H) 0.3 - 0.9 mg/dL    Comment: Performed at [facility], [place] [address], [place], [place] [address]  CBC with Differential     Status: Abnormal   Collection Time: 08/17/20  1:22 PM  Result Value Ref Range   WBC 12.7 (H) 4.0 - 10.5 K/uL   RBC 3.35 (L) 3.87 - 5.11 MIL/uL   Hemoglobin 9.7 (L) 12.0 - 15.0 g/dL   HCT 31.3 (L) 36 - 46 %   MCV 93.4 80.0 - 100.0 fL   MCH 29.0 26.0 - 34.0 pg   MCHC 31.0 30.0 - 36.0 g/dL   RDW 14.7 11.5 - 15.5 %   Platelets 81 (L) 150 - 400 K/uL    Comment: REPEATED TO VERIFY SPECIMEN CHECKED FOR CLOTS Immature Platelet Fraction may be clinically indicated, consider ordering this additional test [id number] CONSISTENT WITH PREVIOUS RESULT    nRBC 0.0 0.0 - 0.2 %   Neutrophils Relative % 87 %   Neutro Abs 11.1 (H) 1.7 - 7.7 K/uL   Lymphocytes Relative 5 %   Lymphs Abs 0.6 (L) 0.7 - 4.0 K/uL   Monocytes Relative 5 %   Monocytes Absolute 0.6 0.1 - 1.0 K/uL   Eosinophils Relative 0 %   Eosinophils Absolute 0.0 0.0 - 0.5 K/uL   Basophils Relative 0 %   Basophils Absolute 0.0 0.0 - 0.1 K/uL   Immature Granulocytes 3 %   Abs Immature Granulocytes 0.33 (H) 0.00 - 0.07 K/uL    Comment: Performed at [facility], [place] [address] [place], [place] [address]  Protime-INR     Status: None   Collection Time: 08/17/20  1:22 PM  Result Value Ref Range   Prothrombin Time 15.1 11.4 - 15.2 seconds   INR 1.2 0.8 - 1.2    Comment: (NOTE) INR goal varies based on device and disease states. Performed at [facility], [place] [address] [place], [place] [address]    DG Elbow 2 Views Left  Result Date: 08/17/2020 CLINICAL DATA:  Left elbow pain since a fall this morning. Initial encounter. EXAM: LEFT ELBOW - 2 VIEW COMPARISON:  None. FINDINGS: There is no evidence of fracture, dislocation, or joint effusion. There is no evidence of arthropathy or other focal bone abnormality. Soft tissues are unremarkable. IMPRESSION: Negative exam. Electronically Signed   By: Inge Rise M.D.   On: 08/17/2020 15:28   DG Wrist 2 Views Left  Result Date: 08/17/2020 CLINICAL DATA:  Left wrist pain after a fall this morning. EXAM: LEFT WRIST - 2 VIEW COMPARISON:  None. FINDINGS: There is no evidence of fracture or dislocation. There is no evidence of arthropathy or other focal bone abnormality. Ulnar minus variance noted. Soft tissues are unremarkable. IMPRESSION: No acute abnormality. Electronically Signed   By: Inge Rise M.D.  On: 08/17/2020 15:29   CT PELVIS WO CONTRAST  Result Date: 08/17/2020 CLINICAL DATA:  Fall with left hip pain. EXAM: CT PELVIS WITHOUT CONTRAST TECHNIQUE: Multidetector CT imaging of the pelvis was performed following the standard protocol without intravenous contrast. COMPARISON:  None. FINDINGS: Urinary Tract: Mild bladder wall thickening noted with incomplete bladder distention. Bowel:  Unremarkable visualized pelvic bowel loops. Vascular/Lymphatic: Atherosclerotic calcification noted distal aorta and common iliac arteries. No pelvic sidewall lymphadenopathy. Reproductive:  Unremarkable. Other:  No intraperitoneal free fluid. Musculoskeletal: Comminuted fracture of the  left superior and inferior pubic rami. Superior ramus fracture extends into the left pubic bone. There is extensive extraperitoneal hemorrhage in the left pelvic floor extending into the anterior abdominal wall and displacing the bladder cranially and to the right. There is an associated fracture of the left sacrum extending into the SI joint without diastasis. IMPRESSION: 1. Comminuted fracture of the left superior and inferior pubic rami with extension into the left pubic bone. 2. Associated fracture of the left sacrum extending into the SI joint without diastasis. 3. Extensive extraperitoneal hemorrhage in the left anterior pelvic floor extending into the anterior abdominal wall and displacing the bladder cranially and to the right. 4. Aortic Atherosclerosis (ICD10-I70.0). Electronically Signed   By: Misty Stanley M.D.   On: 08/17/2020 07:12   CT ABDOMEN PELVIS W CONTRAST  Result Date: 08/17/2020 CLINICAL DATA:  Pelvic fractures with hemorrhage EXAM: CT ABDOMEN AND PELVIS WITH CONTRAST TECHNIQUE: Multidetector CT imaging of the abdomen and pelvis was performed using the standard protocol following bolus administration of intravenous contrast. CONTRAST:  130mL OMNIPAQUE IOHEXOL 300 MG/ML  SOLN COMPARISON:  Pelvis CT earlier same day FINDINGS: Lower chest: Mild scarring at the lung bases. Partially imaged cardiomegaly. Hepatobiliary: Small hepatic cysts and other too small to characterize hypodense lesions. No hepatic injury or perihepatic hematoma. Gallbladder is unremarkable. Prominence of the proximal common bile duct with tapering. Pancreas: Unremarkable apart from some atrophy. Spleen: Too small to characterize hypodense lesion. No splenic injury or perisplenic hematoma. Adrenals/Urinary Tract: Bladder is displaced superiorly into the right by hemorrhage. Integrity of the bladder is not evaluated on this study. Small right renal cysts. Adrenals are unremarkable. Stomach/Bowel: Stomach is within normal  limits. Bowel is normal in caliber. Vascular/Lymphatic: Diffuse area iliac atherosclerosis. No enlarged lymph nodes identified. Reproductive: Uterus and bilateral adnexa are unremarkable. Musculoskeletal: Left pelvic and sacral fractures as previously described. Associated extraperitoneal hemorrhage along the ventral abdominal wall and left pelvic sidewall extending into the left paracolic gutter. No substantial change. Chronic appearing severe L1 compression fracture. IMPRESSION: Pelvic and sacral fractures as described previously. Associated extraperitoneal hemorrhage is not substantially changed. No evidence of acute traumatic injury in the abdomen. Electronically Signed   By: Macy Mis M.D.   On: 08/17/2020 13:09   DG CHEST PORT 1 VIEW  Result Date: 08/17/2020 CLINICAL DATA:  Left hip and elbow pain post fall EXAM: PORTABLE CHEST 1 VIEW COMPARISON:  Radiograph 05/26/2019 FINDINGS: Low lung volumes with some atelectatic changes. Chronic biapical pleuroparenchymal scarring and coarsened reticular changes are similar to comparison exams. Postsurgical changes related to prior CABG including intact and aligned sternotomy wires and multiple surgical clips projecting over the mediastinum. Prominent cardiac silhouette likely accentuated by the portable technique with a calcified aorta. Pacer pack overlies the left chest wall with single lead at the cardiac apex similar to prior. No acute osseous or soft tissue abnormality. Degenerative changes are present in the imaged spine and shoulders. Features of right  shoulder calcific tendinosis/hydroxyapatite deposition. IMPRESSION: No acute cardiopulmonary abnormality. No acute traumatic findings in the chest. Aortic Atherosclerosis (ICD10-I70.0). Prior sternotomy and CABG. Well seated pacer lead in stable position. Electronically Signed   By: Lovena Le M.D.   On: 08/17/2020 15:03    Pending Labs Unresulted Labs (From admission, onward)          Start      Ordered   08/18/20 0500  Comprehensive metabolic panel  Tomorrow morning,   R        08/17/20 1737   08/18/20 0500  CBC  Tomorrow morning,   R        08/17/20 1737   08/17/20 1738  Pathologist smear review  Once,   STAT        08/17/20 1737          Vitals/Pain Today's Vitals   08/17/20 1630 08/17/20 1700 08/17/20 1730 08/17/20 1800  BP: (!) 162/99 (!) 166/76 (!) 181/93 (!) 157/96  Pulse: (!) 104 (!) 166 87 (!) 107  Resp: 19 17 19 16   Temp:      TempSrc:      SpO2: 98% 96% 94% 97%  Weight:      Height:      PainSc:        Isolation Precautions No active isolations  Medications Medications  sodium chloride (PF) 0.9 % injection (has no administration in time range)  atenolol (TENORMIN) tablet 50 mg (has no administration in time range)  acetaminophen (TYLENOL) tablet 1,000 mg (has no administration in time range)  digoxin (LANOXIN) tablet 0.125 mg (has no administration in time range)  LORazepam (ATIVAN) tablet 1 mg (has no administration in time range)  calcium-vitamin D (OSCAL WITH D) 500-200 MG-UNIT per tablet 1 tablet (has no administration in time range)  ondansetron (ZOFRAN) tablet 4 mg (has no administration in time range)    Or  ondansetron (ZOFRAN) injection 4 mg (has no administration in time range)  fentaNYL (SUBLIMAZE) injection 25 mcg (25 mcg Intravenous Given 08/17/20 0738)  hydrocortisone sodium succinate (SOLU-CORTEF) injection 200 mg (200 mg Intravenous Given 08/17/20 0833)  diphenhydrAMINE (BENADRYL) capsule 50 mg ( Oral See Alternative 08/17/20 1113)    Or  diphenhydrAMINE (BENADRYL) injection 50 mg (50 mg Intravenous Given 08/17/20 1113)  0.9 %  sodium chloride infusion ( Intravenous Stopped 08/17/20 1333)  iohexol (OMNIPAQUE) 300 MG/ML solution 100 mL (100 mLs Intravenous Contrast Given 08/17/20 1222)  0.9 %  sodium chloride infusion ( Intravenous Stopped 08/17/20 1637)  fentaNYL (SUBLIMAZE) injection 25 mcg (25 mcg Intravenous Given 08/17/20 1349)     Mobility walks

## 2020-08-17 NOTE — Consult Note (Signed)
[facility] Tele[facility]urology Consult Services   Date of Service:   08/17/2020 23:17:37  Impression:     .  I63.00 - Cerebrovascular accident (CVA) due to thrombosis of precerebral artery (HCCC)  Comments/Sign-Out: She's not a candidate for thrombolytic therapy because of the retroperitoneal hemorrhage and thrombocytopenia. CT staff said they could not get the contrast because of the reported reactions. She needs MRI the brain in the morning.  Metrics: Last Known Well: 08/17/2020 19:27:00 [facility] Notification Time: 08/17/2020 23:17:37 Stamp Time: 08/17/2020 23:17:37 Time First Login Attempt: 08/17/2020 23:27:00 Symptoms: New left side weakness. NIHSS Start Assessment Time: 08/17/2020 23:35:00 Patient is not a candidate for Thrombolytic. Thrombolytic Medical Decision: 08/17/2020 23:30:50 Patient was not deemed candidate for Thrombolytic because of following reasons: Thrombocytopenia. Retroperitoneal hemorrhage.  CT head showed no acute hemorrhage or acute core infarct. CT head was reviewed and results were: No acute ischemic change  Radiologist was not called back for review of advanced imaging.  Advanced Imaging: Advanced Imaging Not Recommended because:  Not able to get contrast   Our recommendations are outlined below.  Recommendations:     .  Activate Stroke Protocol Admission/Order Set     .  Stroke/Telemetry Floor     .  Neuro Checks     .  Bedside Swallow Eval     .  DVT Prophylaxis     .  IV Fluids, Normal Saline     .  Head of Bed 30 Degrees     .  Euglycemia and Avoid Hyperthermia (PRN Acetaminophen)     .  Hold Antithrombotics for Now  Routine Consultation with [facility] for Follow up Care  Sign Out:     .  Discussed with Rapid Response Team    ------------------------------------------------------------------------------  History of Present Illness: Patient is a 86 year old Female.  Inpatient stroke alert was  called for symptoms of New left side weakness.  This 86 year old woman was admitted to the hospital yesterday following the fall which she had severe multifocal put pelvic fracture and appears to be a large retroperitoneal hematoma. She is thrombocytopenia. PT was elevated. Vascular risk factors listed below.  Last seen normal was within 4.5 hours. There is history of hemorrhagic complications or intracranial hemorrhage. There is no history of Recent Anticoagulants. There is no history of recent major surgery. There is no history of recent stroke.  Past Medical History:     . Hypertension     . Diabetes Mellitus     . Atrial Fibrillation     . There is NO history of Hyperlipidemia     . There is NO history of Coronary Artery Disease     . There is NO history of Stroke  Social History:Unable to obtain due to Patient Status  Family History:Unable to obtain due to Patient Status  Anticoagulant use:  No  Antiplatelet use: No  Allergies:  Reviewed Allergies Text: Bactrim, sulfa, Relafen, iodine contrast     Examination: BP(153/119), Pulse(89), Blood Glucose(112) 1A: Level of Consciousness - Alert; keenly responsive + 0 1B: Ask Month and Age - Both Questions Right + 0 1C: Blink Eyes & Squeeze Hands - Performs Both Tasks + 0 2: Test Horizontal Extraocular Movements - Normal + 0 3: Test Visual Fields - No Visual Loss + 0 4: Test Facial Palsy (Use Grimace if Obtunded) - Normal symmetry + 0 5A: Test Left Arm Motor Drift - No Movement + 4 5B: Test Right Arm Motor Drift - No Drift for 10  Seconds + 0 6A: Test Left Leg Motor Drift - No Effort Against Gravity + 3 6B: Test Right Leg Motor Drift - No Drift for 5 Seconds + 0 7: Test Limb Ataxia (FNF/Heel-Shin) - No Ataxia + 0 8: Test Sensation - Mild-Moderate Loss: Less Sharp/More Dull + 1 9: Test Language/Aphasia - Normal; No aphasia + 0 10: Test Dysarthria - Normal + 0 11: Test Extinction/Inattention - No abnormality + 0  NIHSS  Score: 8  Pre-Morbid Modified Rankin Scale: Unable to assess   Patient/Family was informed the Neurology Consult would occur via TeleHealth consult by way of interactive audio and video telecommunications and consented to receiving care in this manner.   Patient is being evaluated for possible acute neurologic impairment and high probability of imminent or life-threatening deterioration. I spent total of 30 minutes providing care to this patient, including time for face to face visit via telemedicine, review of medical records, imaging studies and discussion of findings with providers, the patient and/or family.   Dr Cindie Laroche   [facility] [phone]  Case [id number]

## 2020-08-17 NOTE — ED Notes (Signed)
Pt transported to CT ?

## 2020-08-18 ENCOUNTER — Inpatient Hospital Stay (HOSPITAL_COMMUNITY): Payer: Medicare Other

## 2020-08-18 DIAGNOSIS — S32502A Unspecified fracture of left pubis, initial encounter for closed fracture: Secondary | ICD-10-CM | POA: Diagnosis not present

## 2020-08-18 DIAGNOSIS — S36892A Contusion of other intra-abdominal organs, initial encounter: Secondary | ICD-10-CM

## 2020-08-18 DIAGNOSIS — S3210XA Unspecified fracture of sacrum, initial encounter for closed fracture: Secondary | ICD-10-CM | POA: Diagnosis not present

## 2020-08-18 DIAGNOSIS — I639 Cerebral infarction, unspecified: Secondary | ICD-10-CM | POA: Diagnosis not present

## 2020-08-18 DIAGNOSIS — S32402A Unspecified fracture of left acetabulum, initial encounter for closed fracture: Secondary | ICD-10-CM | POA: Diagnosis not present

## 2020-08-18 LAB — COMPREHENSIVE METABOLIC PANEL
ALT: 21 U/L (ref 0–44)
AST: 26 U/L (ref 15–41)
Albumin: 3.4 g/dL — ABNORMAL LOW (ref 3.5–5.0)
Alkaline Phosphatase: 53 U/L (ref 38–126)
Anion gap: 8 (ref 5–15)
BUN: 21 mg/dL (ref 8–23)
CO2: 23 mmol/L (ref 22–32)
Calcium: 8.7 mg/dL — ABNORMAL LOW (ref 8.9–10.3)
Chloride: 106 mmol/L (ref 98–111)
Creatinine, Ser: 1.01 mg/dL — ABNORMAL HIGH (ref 0.44–1.00)
GFR, Estimated: 49 mL/min — ABNORMAL LOW (ref >60)
Glucose, Bld: 134 mg/dL — ABNORMAL HIGH (ref 70–99)
Potassium: 3.9 mmol/L (ref 3.5–5.1)
Sodium: 137 mmol/L (ref 135–145)
Total Bilirubin: 1.2 mg/dL (ref 0.3–1.2)
Total Protein: 6.1 g/dL — ABNORMAL LOW (ref 6.5–8.1)

## 2020-08-18 LAB — CBC
HCT: 29.2 % — ABNORMAL LOW (ref 36.0–46.0)
Hemoglobin: 9.1 g/dL — ABNORMAL LOW (ref 12.0–15.0)
MCH: 29.4 pg (ref 26.0–34.0)
MCHC: 31.2 g/dL (ref 30.0–36.0)
MCV: 94.5 fL (ref 80.0–100.0)
Platelets: 81 10*3/uL — ABNORMAL LOW (ref 150–400)
RBC: 3.09 MIL/uL — ABNORMAL LOW (ref 3.87–5.11)
RDW: 14.9 % (ref 11.5–15.5)
WBC: 15.9 10*3/uL — ABNORMAL HIGH (ref 4.0–10.5)
nRBC: 0 % (ref 0.0–0.2)

## 2020-08-18 LAB — SURGICAL PCR SCREEN
MRSA, PCR: NEGATIVE
Staphylococcus aureus: POSITIVE — AB

## 2020-08-18 LAB — PATHOLOGIST SMEAR REVIEW

## 2020-08-18 IMAGING — MR MR MRA HEAD W/O CM
11 of 12 series · 39 of 48 positions shown · non-contrast
Comparison: Same day CT head
COMPARISON: Same day CT head

Addendum:
CLINICAL DATA: Atrial fibrillation.  TIA versus stroke.

EXAM:
MRI HEAD WITHOUT CONTRAST
MRA HEAD WITHOUT CONTRAST
TECHNIQUE: Multiplanar, multiecho pulse sequences of the brain and surrounding
structures were obtained without intravenous contrast. Angiographic
images of the head were obtained using MRA technique without
contrast.

[Series 6: DWI · oblique · 3.0mm · 1.31mm/px · 8 of 104 slices shown (1 of 4)]
[im 1/104]
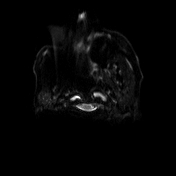
[im 15/104]
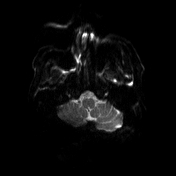
[im 30/104]
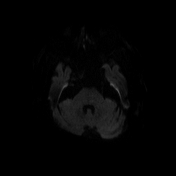
[im 45/104]
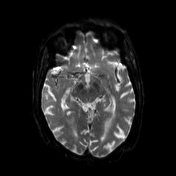
[im 59/104]
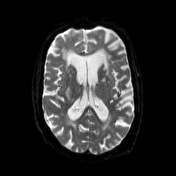
[im 74/104]
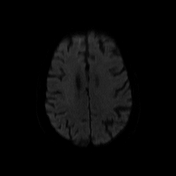
[im 89/104]
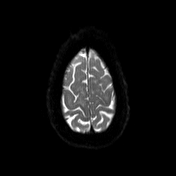
[im 104/104]
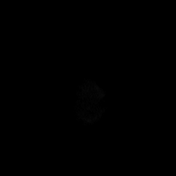

[Series 7: DWI · oblique · 3.0mm · 1.31mm/px · 4 of 52 slices shown (2 of 4)]
[im 1/52]
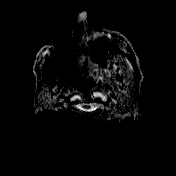
[im 18/52]
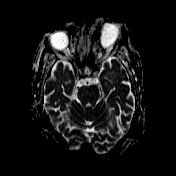
[im 35/52]
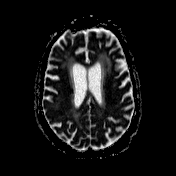
[im 52/52]
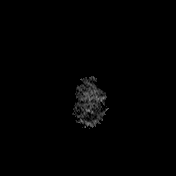

[Series 8: mip_images(sw) · oblique · 32.0mm · 0.72mm/px · 3 of 33 slices shown]
[im 1/33]
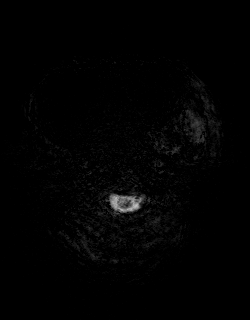
[im 17/33]
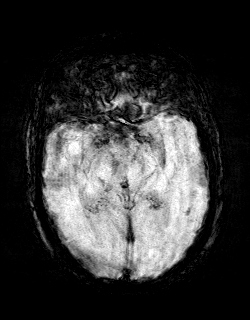
[im 33/33]
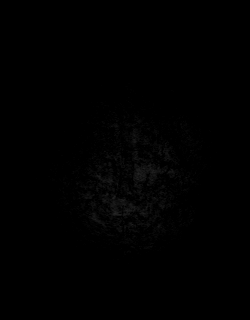

[Series 9: swi_images · oblique · 4.0mm · 0.72mm/px · 3 of 40 slices shown]
[im 1/40]
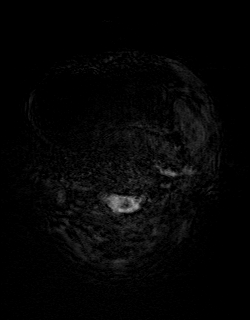
[im 20/40]
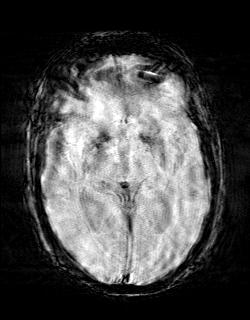
[im 40/40]
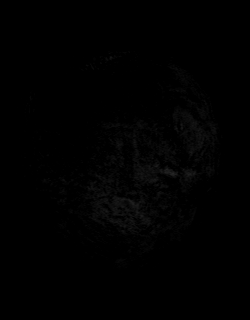

[Series 14: FLAIR · oblique · 3.0mm · 0.72mm/px · 2 of 26 slices shown]
[im 1/26]
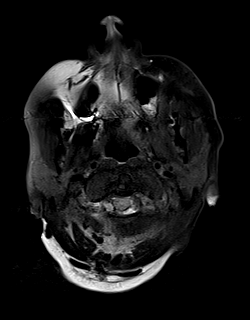
[im 26/26]
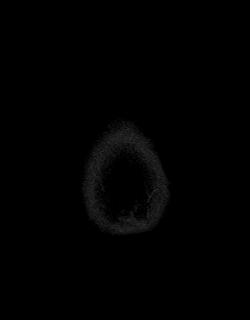

[Series 15: DWI · coronal · 5.0mm · 1.31mm/px · 6 of 72 slices shown (3 of 4)]
[im 1/72]
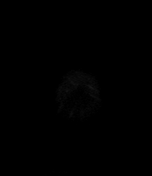
[im 15/72]
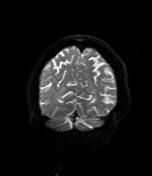
[im 29/72]
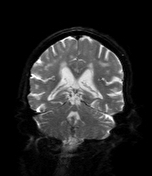
[im 43/72]
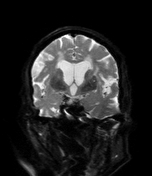
[im 57/72]
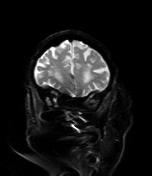
[im 72/72]
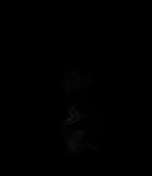

[Series 16: DWI · coronal · 5.0mm · 1.31mm/px · 3 of 38 slices shown (4 of 4)]
[im 1/38]
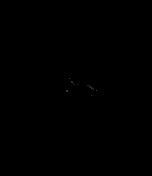
[im 19/38]
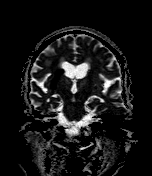
[im 38/38]
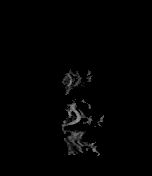

[Series 21: T1 · sagittal · 5.0mm · 0.75mm/px · 2 of 24 slices shown (1 of 2)]
[im 1/24]
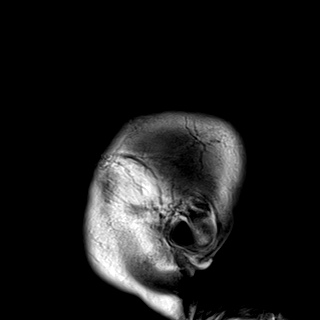
[im 24/24]
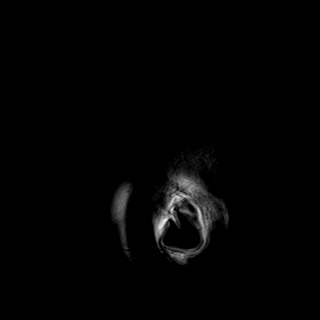

[Series 23: T2 · oblique · 5.0mm · 0.60mm/px · 2 of 25 slices shown (1 of 2)]
[im 1/25]
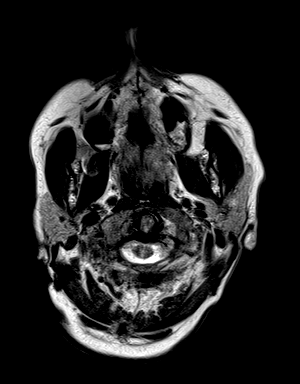
[im 25/25]
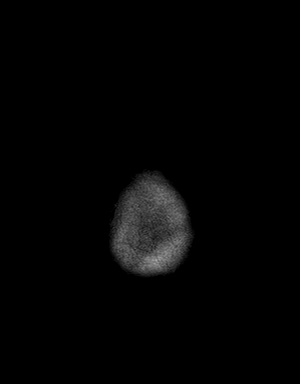

[Series 24: T1 · oblique · 5.0mm · 0.72mm/px · 3 of 32 slices shown (2 of 2)]
[im 1/32]
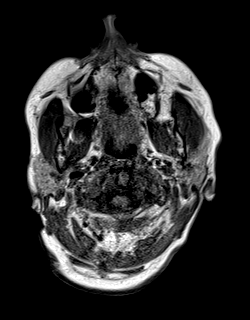
[im 16/32]
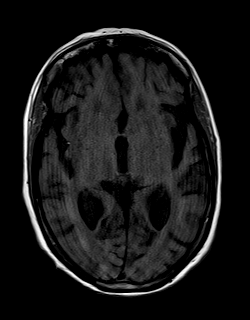
[im 32/32]
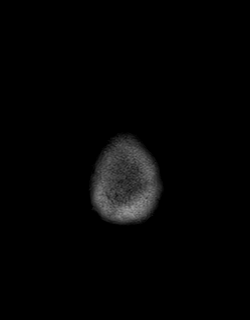

[Series 25: T2 · coronal · 5.0mm · 0.57mm/px · 3 of 36 slices shown (2 of 2)]
[im 1/36]
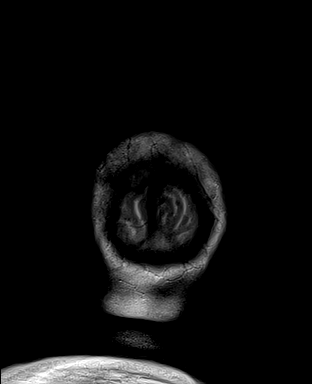
[im 18/36]
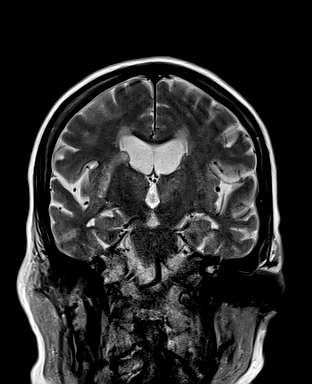
[im 36/36]
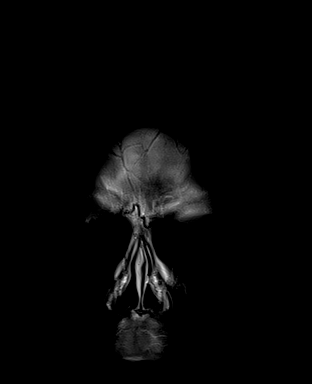

[39 of 48 positions shown; findings below may reference images not displayed]

FINDINGS: MRI HEAD FINDINGS

Brain: There is an acute infarct within the posterior limb of the
internal capsule, which involves a portion of the posterior putamen
and extends into the overlying right corona radiata. Remote lacunar
infarcts involving bilateral cerebellar hemispheres and possibly the
left frontal white matter. Mild edema without substantial mass
effect. Additional scattered T2/FLAIR hyperintensities throughout
the white matter is compatible with chronic microvascular ischemic
disease. Mild to moderate diffuse cerebral volume loss with ex vacuo
ventricular dilation. No acute hemorrhage. No hydrocephalus.

Skull and upper cervical spine: Normal marrow signal.

Sinuses/Orbits: Mild scattered paranasal sinus mucosal thickening.
No acute orbital abnormality.

Other: No mastoid effusions.

MRA HEAD FINDINGS

Vascular evaluation is limited by patient motion. Within this
limitation:

Anterior circulation: There is apparent signal loss involving
bilateral proximal right M2 MCA divisions, concerning for stenosis
but poorly evaluated on this motion limited exam. Late branching of
the left MCA bifurcation. No evidence of significant stenosis
proximally involving the left MCA or both ACAs. no evidence of
aneurysm on this motionless limited exam.

Posterior circulation: Diminutive left V4 vertebral artery, likely
secondary to non dominant vertebral artery. Bilateral fetal type
PCAs. Likely moderate stenosis of the right proximal P2 PCA. No
significant stenosis of the left PCA. No evidence of aneurysm on
this motion limited exam.
IMPRESSION: 1. Acute infarct in the right basal ganglia, extending into the
overlying corona radiata. Mild edema without substantial mass
effect.
2. There is apparent signal loss involving bilateral proximal right
M2 MCA divisions, concerning for stenosis but poorly evaluated on
this motion limited exam. A CTA could further evaluate and quantify
the degree of stenosis if clinically indicated.
3. Bilateral fetal type PCAs with likely moderate stenosis of the
right proximal P2 PCA.
4. Diminutive left V4 vertebral artery, likely secondary to non
dominant vertebral artery. Dedicated neck CTA or MRA imaging could
further evaluate if clinically indicated.
5. Chronic microvascular ischemic disease and remote lacunar
infarcts, as detailed above.

ADDENDUM:
Findings discussed with Dr. BANNER at [DATE].

*** End of Addendum ***
FINDINGS: MRI HEAD FINDINGS

Brain: There is an acute infarct within the posterior limb of the
internal capsule, which involves a portion of the posterior putamen
and extends into the overlying right corona radiata. Remote lacunar
infarcts involving bilateral cerebellar hemispheres and possibly the
left frontal white matter. Mild edema without substantial mass
effect. Additional scattered T2/FLAIR hyperintensities throughout
the white matter is compatible with chronic microvascular ischemic
disease. Mild to moderate diffuse cerebral volume loss with ex vacuo
ventricular dilation. No acute hemorrhage. No hydrocephalus.

Skull and upper cervical spine: Normal marrow signal.

Sinuses/Orbits: Mild scattered paranasal sinus mucosal thickening.
No acute orbital abnormality.

Other: No mastoid effusions.

MRA HEAD FINDINGS

Vascular evaluation is limited by patient motion. Within this
limitation:

Anterior circulation: There is apparent signal loss involving
bilateral proximal right M2 MCA divisions, concerning for stenosis
but poorly evaluated on this motion limited exam. Late branching of
the left MCA bifurcation. No evidence of significant stenosis
proximally involving the left MCA or both ACAs. no evidence of
aneurysm on this motionless limited exam.

Posterior circulation: Diminutive left V4 vertebral artery, likely
secondary to non dominant vertebral artery. Bilateral fetal type
PCAs. Likely moderate stenosis of the right proximal P2 PCA. No
significant stenosis of the left PCA. No evidence of aneurysm on
this motion limited exam.
IMPRESSION: 1. Acute infarct in the right basal ganglia, extending into the
overlying corona radiata. Mild edema without substantial mass
effect.
2. There is apparent signal loss involving bilateral proximal right
M2 MCA divisions, concerning for stenosis but poorly evaluated on
this motion limited exam. A CTA could further evaluate and quantify
the degree of stenosis if clinically indicated.
3. Bilateral fetal type PCAs with likely moderate stenosis of the
right proximal P2 PCA.
4. Diminutive left V4 vertebral artery, likely secondary to non
dominant vertebral artery. Dedicated neck CTA or MRA imaging could
further evaluate if clinically indicated.
5. Chronic microvascular ischemic disease and remote lacunar
infarcts, as detailed above.

## 2020-08-18 MED ORDER — TRAMADOL HCL 50 MG PO TABS
50.0000 mg | ORAL_TABLET | Freq: Four times a day (QID) | ORAL | Status: DC | PRN
  Administered 2020-08-19 – 2020-08-22 (×4): 50 mg via ORAL
  Filled 2020-08-18 (×4): qty 1

## 2020-08-18 MED ORDER — RESOURCE THICKENUP CLEAR PO POWD
ORAL | Status: DC | PRN
  Filled 2020-08-18: qty 125

## 2020-08-18 MED ORDER — MORPHINE SULFATE (PF) 2 MG/ML IV SOLN
1.0000 mg | INTRAVENOUS | Status: DC | PRN
  Administered 2020-08-18 – 2020-08-22 (×3): 1 mg via INTRAVENOUS
  Filled 2020-08-18 (×4): qty 1

## 2020-08-18 MED ORDER — SODIUM CHLORIDE 0.9 % IV SOLN: INTRAVENOUS | Status: DC

## 2020-08-18 MED ORDER — METOPROLOL TARTRATE 5 MG/5ML IV SOLN
2.5000 mg | Freq: Four times a day (QID) | INTRAVENOUS | Status: DC | PRN
  Administered 2020-08-18 – 2020-08-19 (×2): 2.5 mg via INTRAVENOUS
  Filled 2020-08-18 (×2): qty 5

## 2020-08-18 MED ORDER — ACETAMINOPHEN 500 MG PO TABS
1000.0000 mg | ORAL_TABLET | Freq: Four times a day (QID) | ORAL | Status: DC
  Administered 2020-08-18 – 2020-08-23 (×20): 1000 mg via ORAL
  Filled 2020-08-18 (×20): qty 2

## 2020-08-18 MED ORDER — METOPROLOL TARTRATE 5 MG/5ML IV SOLN
2.5000 mg | Freq: Three times a day (TID) | INTRAVENOUS | Status: DC
  Administered 2020-08-18: 2.5 mg via INTRAVENOUS
  Filled 2020-08-18: qty 5

## 2020-08-18 MED ORDER — METHOCARBAMOL 1000 MG/10ML IJ SOLN
500.0000 mg | Freq: Three times a day (TID) | INTRAVENOUS | Status: DC
  Administered 2020-08-18 – 2020-08-22 (×13): 500 mg via INTRAVENOUS
  Filled 2020-08-18: qty 500
  Filled 2020-08-18: qty 5
  Filled 2020-08-18 (×12): qty 500

## 2020-08-18 MED ORDER — LIDOCAINE 5 % EX PTCH
1.0000 | MEDICATED_PATCH | CUTANEOUS | Status: DC
  Administered 2020-08-18 – 2020-08-23 (×4): 1 via TRANSDERMAL
  Filled 2020-08-18 (×7): qty 1

## 2020-08-18 MED ORDER — FENTANYL CITRATE (PF) 100 MCG/2ML IJ SOLN
25.0000 ug | Freq: Once | INTRAMUSCULAR | Status: AC
  Administered 2020-08-18: 25 ug via INTRAVENOUS
  Filled 2020-08-18: qty 2

## 2020-08-18 MED ORDER — MUPIROCIN 2 % EX OINT
1.0000 "application " | TOPICAL_OINTMENT | Freq: Two times a day (BID) | CUTANEOUS | Status: AC
  Administered 2020-08-18 – 2020-08-22 (×9): 1 via NASAL
  Filled 2020-08-18 (×3): qty 22

## 2020-08-18 NOTE — Progress Notes (Signed)
Rapid Response Event Note called at 2233 arrived at 2235  Reason for Call : Pt has progressive weakness on left side, slurred speech, tongue left -sided facial droop. Per pt nurse.    Initial Focused Assessment:  Pt pupils equal and reactive.  A/O with slurred speech with L facial droop.   Able to f/c.  Pt grips greater on right than left. Pt able to move RLE and Limited Movement LLE (Pt has fell PTA with injury to her left side also).  Breath sounds clear and decreased. LKW time 19:27  Interventions: EKG done, CBG 149.  Code Stroke initiated pt to CT scan called AC to meet me in CT scan with Tele Neuro.  See NIHSS Stroke Scale.  Labs done within 48 hours. See flowsheet for VS.  Tele Neuro MD evaluated pt    Plan of Care: Code Stroke cancelled per NP and Neurology.  Pt not a candidate for thrombolytics for multiple reasons per MD.  Pt back to 1332 explained to pt RN to f/u with any recommendations per MD.   Dyann Ruddle, RN

## 2020-08-18 NOTE — Progress Notes (Signed)
Patient's BP trending high today, SBP currently 180. Dr. Darrick Meigs notified by this RN; awaiting PRN orders at this time. This RN will continue to carefully monitor pt.

## 2020-08-18 NOTE — Evaluation (Signed)
Clinical/Bedside Swallow Evaluation Patient Details  Name: Susan Conway MRN: [id number] Date of Birth: July 13, 1929  Today's Date: 08/18/2020 Time: SLP Start Time (ACUTE ONLY): 1062 SLP Stop Time (ACUTE ONLY): 1945 SLP Time Calculation (min) (ACUTE ONLY): 60 min  Past Medical History:  Past Medical History:  Diagnosis Date  . Actinic keratosis 05/25/2014  . Anxiety   . Atrial fibrillation ([place]) 09/21/2010  . Basal cell carcinoma 05/25/2014   Multiple removed by Dr. Danella Conway in March 2015: right neck, left neck, scalp   . Bell's palsy 07/18/1979  . Cervicalgia 01/31/2012  . Closed fracture of lumbar vertebra without mention of spinal cord injury 07/17/2005  . Conjunctiva disorder 12/26/2010  . Coronary atherosclerosis of native coronary artery 07/17/1997  . Cramp of limb 08/16/2009  . Degeneration of lumbar or lumbosacral intervertebral disc 01/31/2012  . Disturbance of skin sensation 08/2009  . Dizziness and giddiness 11/08/2011   vertigo  . External hemorrhoids without mention of complication 6/94/8546  . External hemorrhoids without mention of complication 27/01/5008  . Ganglion of tendon sheath 08/16/2009  . Leg cramp 10/27/2013   Most frequently the right leg.   . Long term (current) use of anticoagulants 09/2010  . Lumbago 07/2009  . Meralgia paresthetica 07/2007  . MI, old   . Myalgia and myositis, unspecified 01/17/2012  . Osteoporosis   . Other abnormal blood chemistry 04/08/2012  . Other abnormal blood chemistry 2013   hyperglycemia  . Other disorder of muscle, ligament, and fascia 04/08/2012  . Other specified cardiac dysrhythmias(427.89) 06/27/2010  . Pain in joint, ankle and foot 10/27/2013   Bilateral since 1998   . Pain in joint, shoulder region 08/12/2012  . Pain in joint, upper arm 12/26/2010  . Pain in limb 01/11/2011  . Pain in thoracic spine 01/31/2012  . 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 PVC's (premature ventricular contractions)   . Rash and other  nonspecific skin eruption 08/02/2011  . Senile osteoporosis 07/17/1993  . Unspecified essential hypertension 07/17/1997  . Varicose veins of lower extremities 08/16/2009  . Varicose veins of lower extremities 08/16/2009   Past Surgical History:  Past Surgical History:  Procedure Laterality Date  . COLONOSCOPY WITH PROPOFOL N/A 10/19/2015   Procedure: COLONOSCOPY WITH PROPOFOL;  Surgeon: Susan Mayer, MD;  Location: [facility];  Service: Endoscopy;  Laterality: N/A;  . CORONARY ARTERY BYPASS GRAFT  1998   x2; LIMA to LAD; SVG to diagonal off bypass  . HEMORRHOID SURGERY  08/26/2012   Dr. Brantley Conway  . LOOP RECORDER INSERTION N/A 05/13/2018   Procedure: LOOP RECORDER INSERTION;  Surgeon: Susan Lance, MD;  Location: [facility];  Service: Cardiovascular;  Laterality: N/A;  . LOOP RECORDER REMOVAL N/A 05/26/2019   Procedure: LOOP RECORDER REMOVAL;  Surgeon: Susan Lance, MD;  Location: [facility];  Service: Cardiovascular;  Laterality: N/A;  . MASS EXCISION Left 11/23/2015   Procedure: LEFT WRIST EXCISION CYST;  Surgeon: Susan Cover, MD;  Location: [facility];  Service: Orthopedics;  Laterality: Left;  . PACEMAKER IMPLANT N/A 05/26/2019   Procedure: PACEMAKER IMPLANT;  Surgeon: Susan Lance, MD;  Location: [facility];  Service: Cardiovascular;  Laterality: N/A;  . SKIN BIOPSY  01/29/14   (R) neck; (R) scalp, 2 (L) neck; shave biopsy Superficial basal cell carcinoma Dr. Danella Conway  . TONSILLECTOMY  1940's   HPI:  86 yo female resident of [facility] admit to [facility] after a fall and found to  have severe multifocal pelvic fracture and a retroperitoneal hemorrhage. She is thrombocytopenia.  Pt found to have left sided weakness with progressive dysarthria and left facial droop and concern was present for CVA. CT brain negative.  MRI brain showed right basal ganglia CVA.Marland Kitchen  Pt also with h/o Afib, Bell's palsy *1980- impacting the right side of her face per pt,  cervicalgia, recent baker's cyst, low platelets recently,  Pt failed a [facility] swallow screen twice and SLP evaluation was ordered later.  Pt denies dysphagia prior to admission.   Assessment / Plan / Recommendation Clinical Impression  Pt demonstrates indications of facial and trigeminal nerve deficits evidenced by decreased labial closure and decreased labial sensation on the left.  In addition, hypoglossal nerve deficit likely as pt with mild dysarthria with decreased coordination of lingual movement and suspected left sided weakness.  Pt demonstrates clinical indication of aspiration with thin liquids via small cup bolus c/b cough post-swallow- suspect this is due to decreased oral transit coordination.  Decreasing amount to tsp as well as tucking chin with use of straw prevented s/s of aspiration.  In addition no indications of aspiration with nectar, applesauce nor cracker boluses nor is pt demonstraing signs concerning for severe pharyngeal retention.  Pt took very small bites during the evaluation and states she was called a "bunny" in school due to eating slowly.  Decreased left labial seal noted with minimal amount of cracker/applesauce bolus retention with delayed responses to clear.  These cranial nerve deficit from basal ganglia CVA are her source of dysarthria and dysphagia.  Recommend to initiate dys3/nectar diet and allow ice chips. Proceed with MBS next date to allow instrumental evaluation.  Pt and daughter educated in great detail to plan, other risk factors for aspiration pnas, dietary plan and compensation strategies as well as MBS.  Several questions were answered re: care plan and pt was given swallow precaution sign which she read.  [name]'s cognition appears intact with great ability to focus on detailed information (eg asking what SLP meant on her swallow precaution sign). SLP Visit Diagnosis: Dysphagia, oropharyngeal phase (R13.12);Dysphagia, unspecified (R13.10)    Aspiration Risk   Mild aspiration risk    Diet Recommendation Dysphagia 3 (Mech soft);Nectar-thick liquid;Ice chips PRN after oral care   Liquid Administration via: Cup;Straw Medication Administration: Whole meds with puree Supervision: Patient able to self feed Compensations: Slow rate;Small sips/bites;Lingual sweep for clearance of pocketing Postural Changes: Remain upright for at least 30 minutes after po intake;Seated upright at 90 degrees    Other  Recommendations Oral Care Recommendations: Oral care BID   Follow up Recommendations Other (comment) (tbd)      Frequency and Duration   tbd         Prognosis   good     Swallow Study   General Date of Onset: 08/18/20 HPI: 86 yo female resident of [facility] admit to [facility] after a fall and found to have severe multifocal pelvic fracture and a retroperitoneal hemorrhage. She is thrombocytopenia.  Pt found to have left sided weakness with progressive dysarthria and left facial droop and concern was present for CVA. CT brain negative.  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  Pt denies dysphagia prior to admission. Type of Study: Bedside Swallow Evaluation Diet  Prior to this Study: NPO Temperature Spikes Noted: No Respiratory Status: Nasal cannula History of Recent Intubation: No Behavior/Cognition: Alert;Cooperative Oral Cavity Assessment: Dry Oral Care Completed by SLP: Yes Oral Cavity - Dentition: Adequate natural dentition Vision: Functional for self-feeding (with right hand) Self-Feeding Abilities: Able to feed self Patient Positioning: Upright in bed Baseline Vocal Quality: Low vocal intensity Volitional Cough: Strong Volitional Swallow: Able to elicit    Oral/Motor/Sensory Function Overall Oral Motor/Sensory Function:  Mild impairment Facial ROM: Reduced left;Suspected CN VII (facial) dysfunction Facial Symmetry: Abnormal symmetry left;Suspected CN VII (facial) dysfunction Facial Strength: Reduced left;Suspected CN VII (facial) dysfunction Facial Sensation: Reduced left (minimal amount of cracker and applesauce retained on left lower lip x2 during evaluation) Lingual ROM: Within Functional Limits Lingual Symmetry: Within Functional Limits Lingual Strength: Suspected CN XII (hypoglossal) dysfunction;Reduced Velum: Within Functional Limits Mandible: Within Functional Limits   Ice Chips Ice chips: Within functional limits Presentation: Spoon   Thin Liquid Thin Liquid: Impaired Presentation: Cup;Self Fed;Straw;Spoon Pharyngeal  Phase Impairments: Cough - Delayed;Throat Clearing - Immediate Other Comments: chin tuck posture using straw did not elicit cough - but recommend to proceed with MBS to assure helpful to prevent aspiration    Nectar Thick Nectar Thick Liquid: Impaired Presentation: Self Fed;Cup Pharyngeal Phase Impairments: Suspected delayed Swallow   Honey Thick Honey Thick Liquid: Not tested   Puree Puree: Impaired Presentation: Self Fed;Spoon Oral Phase Impairments: Reduced labial seal Oral Phase Functional Implications: Other (comment);Left anterior spillage (minimal amount retained on left lip with delayed clearance)   Solid     Solid: Impaired Presentation: Self Fed Oral Phase Impairments: Reduced labial seal Oral Phase Functional Implications: Left anterior spillage Other Comments: minimal amount of cracker retained on left labial area with delaye response to clear      Macario Golds 08/18/2020,9:02 PM   Kathleen Lime, MS [facility] SLP Acute Rehab Services Office [phone] Pager [phone]

## 2020-08-18 NOTE — Progress Notes (Addendum)
[facility] Progress Note     Subjective: CC:  C/o L hip pain and discomfort. Requesting more pain medication. Requesting something to drink.   Progressive left sided weakness yesterday evening now with likely CVA, scheduled for MRI brain this AM.  Objective: Vital signs in last 24 hours: Temp:  [97.8 F (36.6 C)-98.4 F (36.9 C)] 98.4 F (36.9 C) (10/13 0230) Pulse Rate:  [81-166] 94 (10/13 0800) Resp:  [14-25] 22 (10/13 0700) BP: (137-190)/(58-106) 161/58 (10/13 0800) SpO2:  [94 %-100 %] 99 % (10/13 0800) Weight:  [61.4 kg-63 kg] 63 kg (10/13 0230) Last BM Date: 08/16/20  Intake/Output from previous day: 10/12 0701 - 10/13 0700 In: 846.2 [P.O.:120; I.V.:726.2] Out: 350 [Urine:350] Intake/Output this shift: No intake/output data recorded.  PE: Gen:  Alert, NAD, appears uncomfortable Card: irregular, rate 90's, no peripheral edema  Pulm:  Normal effort, clear to auscultation bilaterally Abd: Soft, non-tender, mild distention, no HSM Skin: warm and dry, no rashes  Psych: A&Ox3  Neuro: No dysarthria. No aphasia. pupils equal, round reactive, EOMS in tact, unable to move L hand, able to flex/extend left elbow, sensation and movement LLE in tact. Grip strength 5/5 RUE.   Lab Results:  Recent Labs    08/17/20 1322 08/18/20 0255  WBC 12.7* 15.9*  HGB 9.7* 9.1*  HCT 31.3* 29.2*  PLT 81* 81*   BMET Recent Labs    08/17/20 0731 08/18/20 0255  NA 142 137  K 3.7 3.9  CL 105 106  CO2 24 23  GLUCOSE 112* 134*  BUN 24* 21  CREATININE 0.89 1.01*  CALCIUM 9.3 8.7*   PT/INR Recent Labs    08/17/20 1322  LABPROT 15.1  INR 1.2   CMP     Component Value Date/Time   NA 137 08/18/2020 0255   NA 142 07/22/2020 0000   K 3.9 08/18/2020 0255   CL 106 08/18/2020 0255   CO2 23 08/18/2020 0255   GLUCOSE 134 (H) 08/18/2020 0255   BUN 21 08/18/2020 0255   BUN 26 (A) 07/22/2020 0000   CREATININE 1.01 (H) 08/18/2020 0255   CALCIUM 8.7 (L) 08/18/2020 0255    PROT 6.1 (L) 08/18/2020 0255   ALBUMIN 3.4 (L) 08/18/2020 0255   AST 26 08/18/2020 0255   ALT 21 08/18/2020 0255   ALKPHOS 53 08/18/2020 0255   BILITOT 1.2 08/18/2020 0255   GFRNONAA 49 (L) 08/18/2020 0255   GFRAA >60 05/26/2019 0812   Lipase  No results found for: LIPASE     Studies/Results: DG Elbow 2 Views Left  Result Date: 08/17/2020 CLINICAL DATA:  Left elbow pain since a fall this morning. Initial encounter. EXAM: LEFT ELBOW - 2 VIEW COMPARISON:  None. FINDINGS: There is no evidence of fracture, dislocation, or joint effusion. There is no evidence of arthropathy or other focal bone abnormality. Soft tissues are unremarkable. IMPRESSION: Negative exam. Electronically Signed   By: Inge Rise M.D.   On: 08/17/2020 15:28   DG Wrist 2 Views Left  Result Date: 08/17/2020 CLINICAL DATA:  Left wrist pain after a fall this morning. EXAM: LEFT WRIST - 2 VIEW COMPARISON:  None. FINDINGS: There is no evidence of fracture or dislocation. There is no evidence of arthropathy or other focal bone abnormality. Ulnar minus variance noted. Soft tissues are unremarkable. IMPRESSION: No acute abnormality. Electronically Signed   By: Inge Rise M.D.   On: 08/17/2020 15:29   CT PELVIS WO CONTRAST  Result Date: 08/17/2020 CLINICAL DATA:  Fall with  left hip pain. EXAM: CT PELVIS WITHOUT CONTRAST TECHNIQUE: Multidetector CT imaging of the pelvis was performed following the standard protocol without intravenous contrast. COMPARISON:  None. FINDINGS: Urinary Tract: Mild bladder wall thickening noted with incomplete bladder distention. Bowel:  Unremarkable visualized pelvic bowel loops. Vascular/Lymphatic: Atherosclerotic calcification noted distal aorta and common iliac arteries. No pelvic sidewall lymphadenopathy. Reproductive:  Unremarkable. Other:  No intraperitoneal free fluid. Musculoskeletal: Comminuted fracture of the left superior and inferior pubic rami. Superior ramus fracture extends  into the left pubic bone. There is extensive extraperitoneal hemorrhage in the left pelvic floor extending into the anterior abdominal wall and displacing the bladder cranially and to the right. There is an associated fracture of the left sacrum extending into the SI joint without diastasis. IMPRESSION: 1. Comminuted fracture of the left superior and inferior pubic rami with extension into the left pubic bone. 2. Associated fracture of the left sacrum extending into the SI joint without diastasis. 3. Extensive extraperitoneal hemorrhage in the left anterior pelvic floor extending into the anterior abdominal wall and displacing the bladder cranially and to the right. 4. Aortic Atherosclerosis (ICD10-I70.0). Electronically Signed   By: Misty Stanley M.D.   On: 08/17/2020 07:12   CT ABDOMEN PELVIS W CONTRAST  Result Date: 08/17/2020 CLINICAL DATA:  Pelvic fractures with hemorrhage EXAM: CT ABDOMEN AND PELVIS WITH CONTRAST TECHNIQUE: Multidetector CT imaging of the abdomen and pelvis was performed using the standard protocol following bolus administration of intravenous contrast. CONTRAST:  159mL OMNIPAQUE IOHEXOL 300 MG/ML  SOLN COMPARISON:  Pelvis CT earlier same day FINDINGS: Lower chest: Mild scarring at the lung bases. Partially imaged cardiomegaly. Hepatobiliary: Small hepatic cysts and other too small to characterize hypodense lesions. No hepatic injury or perihepatic hematoma. Gallbladder is unremarkable. Prominence of the proximal common bile duct with tapering. Pancreas: Unremarkable apart from some atrophy. Spleen: Too small to characterize hypodense lesion. No splenic injury or perisplenic hematoma. Adrenals/Urinary Tract: Bladder is displaced superiorly into the right by hemorrhage. Integrity of the bladder is not evaluated on this study. Small right renal cysts. Adrenals are unremarkable. Stomach/Bowel: Stomach is within normal limits. Bowel is normal in caliber. Vascular/Lymphatic: Diffuse area iliac  atherosclerosis. No enlarged lymph nodes identified. Reproductive: Uterus and bilateral adnexa are unremarkable. Musculoskeletal: Left pelvic and sacral fractures as previously described. Associated extraperitoneal hemorrhage along the ventral abdominal wall and left pelvic sidewall extending into the left paracolic gutter. No substantial change. Chronic appearing severe L1 compression fracture. IMPRESSION: Pelvic and sacral fractures as described previously. Associated extraperitoneal hemorrhage is not substantially changed. No evidence of acute traumatic injury in the abdomen. Electronically Signed   By: Macy Mis M.D.   On: 08/17/2020 13:09   DG CHEST PORT 1 VIEW  Result Date: 08/17/2020 CLINICAL DATA:  Left hip and elbow pain post fall EXAM: PORTABLE CHEST 1 VIEW COMPARISON:  Radiograph 05/26/2019 FINDINGS: Low lung volumes with some atelectatic changes. Chronic biapical pleuroparenchymal scarring and coarsened reticular changes are similar to comparison exams. Postsurgical changes related to prior CABG including intact and aligned sternotomy wires and multiple surgical clips projecting over the mediastinum. Prominent cardiac silhouette likely accentuated by the portable technique with a calcified aorta. Pacer pack overlies the left chest wall with single lead at the cardiac apex similar to prior. No acute osseous or soft tissue abnormality. Degenerative changes are present in the imaged spine and shoulders. Features of right shoulder calcific tendinosis/hydroxyapatite deposition. IMPRESSION: No acute cardiopulmonary abnormality. No acute traumatic findings in the chest. Aortic Atherosclerosis (  ICD10-I70.0). Prior sternotomy and CABG. Well seated pacer lead in stable position. Electronically Signed   By: Lovena Le M.D.   On: 08/17/2020 15:03   CT HEAD CODE STROKE WO CONTRAST  Result Date: 08/17/2020 CLINICAL DATA:  Code stroke.  Left-sided facial droop EXAM: CT HEAD WITHOUT CONTRAST TECHNIQUE:  Contiguous axial images were obtained from the base of the skull through the vertex without intravenous contrast. COMPARISON:  None. FINDINGS: Brain: There is no mass, hemorrhage or extra-axial collection. There is generalized atrophy without lobar predilection. There is hypoattenuation of the periventricular white matter, most commonly indicating chronic ischemic microangiopathy. Vascular: No abnormal hyperdensity of the major intracranial arteries or dural venous sinuses. No intracranial atherosclerosis. Skull: The visualized skull base, calvarium and extracranial soft tissues are normal. Sinuses/Orbits: No fluid levels or advanced mucosal thickening of the visualized paranasal sinuses. No mastoid or middle ear effusion. The orbits are normal. ASPECTS [facility] Stroke Program Early CT Score) - Ganglionic level infarction (caudate, lentiform nuclei, internal capsule, insula, M1-M3 cortex): 7 - Supraganglionic infarction (M4-M6 cortex): 3 Total score (0-10 with 10 being normal): 10 IMPRESSION: 1. No acute intracranial abnormality. 2. ASPECTS is 10. 3. Severe chronic ischemic changes of the white matter. These results were called by telephone at the time of interpretation on 08/17/2020 at 11:36 pm to provider Lang Snow , who verbally acknowledged these results. Electronically Signed   By: Ulyses Jarred M.D.   On: 08/17/2020 23:37    Anti-infectives: Anti-infectives (From admission, onward)   None     Assessment/Plan A.fib - not currently on anticoagulation HTN HLD CAD s/p CABG 1998 hemorrhagic baker's cyst R knee Thrombocytopenia RLE ulcer s/p skin biopsy  New left upper extremity weakness, concern for CVA due to thrombosis of the precerebral artery- MRI brain this AM, neuro following. Not a candidate for thrombolytic therapy given pelvic hematoma/thrombocytopenia  Ground level fall  L elbow pain - plain films of left elbow, left wrist negative for fracture Comminuted left inferior and  superior pubic rami FX Left sacral FX Extraperitoneal pelvic hematoma  - afebrile, VSS, hgb stable 9.1 from 9.7 - follow up ortho recs, I have reached out to the ortho trauma PA this morning.  - I will adjust pain control - schedule tylenol, add scheduled IV robaxin, lidoderm patch to L hip, PRN tramadol   - no acute trauma surgery needs, no new traumatic injuries identified. Trauma surgery will sign off. please call as needed.   LOS: 1 day    Obie Dredge, [facility] Please see Amion for pager number during day hours 7:00am-4:30pm

## 2020-08-18 NOTE — Progress Notes (Signed)
Carotid duplex bilateral study completed.   Please see CV Proc for preliminary results.   Lior Cartelli, RDMS  

## 2020-08-18 NOTE — Progress Notes (Signed)
Patient c/o left hip pain of 8/10 on a pain scale. Sharlet Salina NP notified and received a new order for FentaNyl 25 mcg  IV x one dose. Order already implemented. Will continue to monitor.

## 2020-08-18 NOTE — Consult Note (Signed)
Reason for Consult:pelvic ring fracture Referring Physician: Alanda Amass MD  Susan Conway is an 86 y.o. female.  HPI: 86 year old female medical history of atrial fib previously on anticoagulation but not at the time of her fall at home.  She fell 08/17/2020 at home.  She stays at [facility].  Patient is in the bathroom and slipped with fall.  She denies loss of consciousness.  Patient is unable to ambulate and x-ray CT scan in the emergency room demonstrated pelvic ring fractures with comminuted fractures of the left superior inferior pubic rami.  Left sacral fracture extending to the SI joint without diastases.  Patient extra peritoneal hemorrhage with some displacement of the bladder.  Patient with diabetes thrombocytopenia.  Positive for hypertension.  Patient had left-sided weakness which was new.  CT scan without contrast due to patient's contrast allergy was negative for acute changes there were some white matter changes noted.  Concern for CVA with thrombosis of the precerebral artery.  MRI scan is pending today.  Patient has a daughter who is the waiting room who I spoke to today.  Past Medical History:  Diagnosis Date  . Actinic keratosis 05/25/2014  . Anxiety   . Atrial fibrillation ([place]) 09/21/2010  . Basal cell carcinoma 05/25/2014   Multiple removed by Dr. Danella Sensing in March 2015: right neck, left neck, scalp   . Bell's palsy 07/18/1979  . Cervicalgia 01/31/2012  . Closed fracture of lumbar vertebra without mention of spinal cord injury 07/17/2005  . Conjunctiva disorder 12/26/2010  . Coronary atherosclerosis of native coronary artery 07/17/1997  . Cramp of limb 08/16/2009  . Degeneration of lumbar or lumbosacral intervertebral disc 01/31/2012  . Disturbance of skin sensation 08/2009  . Dizziness and giddiness 11/08/2011   vertigo  . External hemorrhoids without mention of complication 5/64/3329  . External hemorrhoids without mention of complication 51/88/4166  . Ganglion of  tendon sheath 08/16/2009  . Leg cramp 10/27/2013   Most frequently the right leg.   . Long term (current) use of anticoagulants 09/2010  . Lumbago 07/2009  . Meralgia paresthetica 07/2007  . MI, old   . Myalgia and myositis, unspecified 01/17/2012  . Osteoporosis   . Other abnormal blood chemistry 04/08/2012  . Other abnormal blood chemistry 2013   hyperglycemia  . Other disorder of muscle, ligament, and fascia 04/08/2012  . Other specified cardiac dysrhythmias(427.89) 06/27/2010  . Pain in joint, ankle and foot 10/27/2013   Bilateral since 1998   . Pain in joint, shoulder region 08/12/2012  . Pain in joint, upper arm 12/26/2010  . Pain in limb 01/11/2011  . Pain in thoracic spine 01/31/2012  . 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 PVC's (premature ventricular contractions)   . Rash and other nonspecific skin eruption 08/02/2011  . Senile osteoporosis 07/17/1993  . Unspecified essential hypertension 07/17/1997  . Varicose veins of lower extremities 08/16/2009  . Varicose veins of lower extremities 08/16/2009    Past Surgical History:  Procedure Laterality Date  . COLONOSCOPY WITH PROPOFOL N/A 10/19/2015   Procedure: COLONOSCOPY WITH PROPOFOL;  Surgeon: Gatha Mayer, MD;  Location: [facility];  Service: Endoscopy;  Laterality: N/A;  . CORONARY ARTERY BYPASS GRAFT  1998   x2; LIMA to LAD; SVG to diagonal off bypass  . HEMORRHOID SURGERY  08/26/2012   Dr. Brantley Stage  . LOOP RECORDER INSERTION N/A 05/13/2018   Procedure: LOOP RECORDER INSERTION;  Surgeon: [name], MD;  Location: [place] [facility];  Service: Cardiovascular;  Laterality: N/A;  . LOOP RECORDER REMOVAL N/A 05/26/2019   Procedure: LOOP RECORDER REMOVAL;  Surgeon: [name], MD;  Location: [place] [facility];  Service: Cardiovascular;  Laterality: N/A;  . MASS EXCISION Left 11/23/2015   Procedure: LEFT WRIST EXCISION CYST;  Surgeon: Leanora Cover, MD;  Location: [place];  Service: Orthopedics;   Laterality: Left;  . PACEMAKER IMPLANT N/A 05/26/2019   Procedure: PACEMAKER IMPLANT;  Surgeon: [name], MD;  Location: [place] [facility];  Service: Cardiovascular;  Laterality: N/A;  . SKIN BIOPSY  01/29/14   (R) neck; (R) scalp, 2 (L) neck; shave biopsy Superficial basal cell carcinoma Dr. Danella Sensing  . TONSILLECTOMY  1940's    Family History  Problem Relation Age of Onset  . Heart attack Father 42  . Heart disease Father   . Hypertension Sister   . Heart disease Sister   . Heart disease Brother     Social History:  reports that she has never smoked. She has never used smokeless tobacco. She reports current alcohol use. She reports that she does not use drugs.  Allergies:  Allergies  Allergen Reactions  . Iodinated Diagnostic Agents Nausea Only and Other (See Comments)    Severe nausea and also passed out  . Bactrim Rash  . Relafen [Nabumetone] Rash  . Sulfanilamide Rash    Medications: I have reviewed the patient's current medications.  Results for orders placed or performed during the hospital encounter of 08/17/20 (from the past 48 hour(s))  Respiratory Panel by RT PCR (Flu A&B, Covid) - Nasopharyngeal Swab     Status: None   Collection Time: 08/17/20  7:07 AM   Specimen: Nasopharyngeal Swab  Result Value Ref Range   SARS Coronavirus 2 by RT PCR NEGATIVE NEGATIVE    Comment: (NOTE) SARS-CoV-2 target nucleic acids are NOT DETECTED.  The SARS-CoV-2 RNA is generally detectable in upper respiratoy specimens during the acute phase of infection. The lowest concentration of SARS-CoV-2 viral copies this assay can detect is 131 copies/mL. A negative result does not preclude SARS-Cov-2 infection and should not be used as the sole basis for treatment or other patient management decisions. A negative result may occur with  improper specimen collection/handling, submission of specimen other than nasopharyngeal swab, presence of viral mutation(s) within the areas  targeted by this assay, and inadequate number of viral copies (<131 copies/mL). A negative result must be combined with clinical observations, patient history, and epidemiological information. The expected result is Negative.  Fact Sheet for Patients:  PinkCheek.be  Fact Sheet for Healthcare Providers:  GravelBags.it  This test is no t yet approved or cleared by the [place] FDA and  has been authorized for detection and/or diagnosis of SARS-CoV-2 by FDA under an Emergency Use Authorization (EUA). This EUA will remain  in effect (meaning this test can be used) for the duration of the COVID-19 declaration under Section 564(b)(1) of the Act, 21 U.S.C. section 360bbb-3(b)(1), unless the authorization is terminated or revoked sooner.     Influenza A by PCR NEGATIVE NEGATIVE   Influenza B by PCR NEGATIVE NEGATIVE    Comment: (NOTE) The Xpert Xpress SARS-CoV-2/FLU/RSV assay is intended as an aid in  the diagnosis of influenza from Nasopharyngeal swab specimens and  should not be used as a sole basis for treatment. Nasal washings and  aspirates are unacceptable for Xpert Xpress SARS-CoV-2/FLU/RSV  testing.  Fact Sheet for Patients: PinkCheek.be  Fact Sheet for Healthcare Providers: GravelBags.it  This test is not yet approved or cleared by the [place] and  has been authorized for detection and/or diagnosis of SARS-CoV-2 by  FDA under an Emergency Use Authorization (EUA). This EUA will remain  in effect (meaning this test can be used) for the duration of the  Covid-19 declaration under Section 564(b)(1) of the Act, 21  U.S.C. section 360bbb-3(b)(1), unless the authorization is  terminated or revoked. Performed at [facility], [place] [address], [place], [place] [address]   CBC with Differential/Platelet     Status: Abnormal    Collection Time: 08/17/20  7:31 AM  Result Value Ref Range   WBC 15.5 (H) 4.0 - 10.5 K/uL   RBC 3.79 (L) 3.87 - 5.11 MIL/uL   Hemoglobin 11.2 (L) 12.0 - 15.0 g/dL   HCT 35.0 (L) 36 - 46 %   MCV 92.3 80.0 - 100.0 fL   MCH 29.6 26.0 - 34.0 pg   MCHC 32.0 30.0 - 36.0 g/dL   RDW 14.6 11.5 - 15.5 %   Platelets 84 (L) 150 - 400 K/uL    Comment: REPEATED TO VERIFY PLATELET COUNT CONFIRMED BY SMEAR SPECIMEN CHECKED FOR CLOTS Immature Platelet Fraction may be clinically indicated, consider ordering this additional test [id number]    nRBC 0.0 0.0 - 0.2 %   Neutrophils Relative % 77 %   Neutro Abs 11.9 (H) 1.7 - 7.7 K/uL   Lymphocytes Relative 6 %   Lymphs Abs 1.0 0.7 - 4.0 K/uL   Monocytes Relative 13 %   Monocytes Absolute 2.0 (H) 0.1 - 1.0 K/uL   Eosinophils Relative 0 %   Eosinophils Absolute 0.0 0.0 - 0.5 K/uL   Basophils Relative 0 %   Basophils Absolute 0.1 0.0 - 0.1 K/uL   Immature Granulocytes 4 %   Abs Immature Granulocytes 0.54 (H) 0.00 - 0.07 K/uL    Comment: Performed at [facility], [place] [address], [place], [place] [address]  Basic metabolic panel     Status: Abnormal   Collection Time: 08/17/20  7:31 AM  Result Value Ref Range   Sodium 142 135 - 145 mmol/L   Potassium 3.7 3.5 - 5.1 mmol/L   Chloride 105 98 - 111 mmol/L   CO2 24 22 - 32 mmol/L   Glucose, Bld 112 (H) 70 - 99 mg/dL    Comment: Glucose reference range applies only to samples taken after fasting for at least 8 hours.   BUN 24 (H) 8 - 23 mg/dL   Creatinine, Ser 0.89 0.44 - 1.00 mg/dL   Calcium 9.3 8.9 - 10.3 mg/dL   GFR, Estimated 57 (L) >60 mL/min   Anion gap 13 5 - 15    Comment: Performed at [facility], [place] [address] [place], [place] [address]  Urinalysis, Routine w reflex microscopic Urine, Clean Catch     Status: Abnormal   Collection Time: 08/17/20  7:31 AM  Result Value Ref Range   Color, Urine YELLOW YELLOW   APPearance CLEAR CLEAR   Specific Gravity,  Urine 1.011 1.005 - 1.030   pH 5.0 5.0 - 8.0   Glucose, UA NEGATIVE NEGATIVE mg/dL   Hgb urine dipstick SMALL (A) NEGATIVE   Bilirubin Urine NEGATIVE NEGATIVE   Ketones, ur NEGATIVE NEGATIVE mg/dL   Protein, ur NEGATIVE NEGATIVE mg/dL   Nitrite NEGATIVE NEGATIVE   Leukocytes,Ua NEGATIVE NEGATIVE   RBC / HPF 0-5 0 - 5 RBC/hpf   WBC, UA 0-5 0 - 5  WBC/hpf   Bacteria, UA RARE (A) NONE SEEN    Comment: Performed at [facility], [place] [address] [place], [place] [address]  Hepatic function panel     Status: Abnormal   Collection Time: 08/17/20  7:31 AM  Result Value Ref Range   Total Protein 6.5 6.5 - 8.1 g/dL   Albumin 3.7 3.5 - 5.0 g/dL   AST 32 15 - 41 U/L   ALT 24 0 - 44 U/L   Alkaline Phosphatase 62 38 - 126 U/L   Total Bilirubin 1.2 0.3 - 1.2 mg/dL   Bilirubin, Direct 0.2 0.0 - 0.2 mg/dL   Indirect Bilirubin 1.0 (H) 0.3 - 0.9 mg/dL    Comment: Performed at [facility], [place] [address] [place], [place] [address]  CBC with Differential     Status: Abnormal   Collection Time: 08/17/20  1:22 PM  Result Value Ref Range   WBC 12.7 (H) 4.0 - 10.5 K/uL   RBC 3.35 (L) 3.87 - 5.11 MIL/uL   Hemoglobin 9.7 (L) 12.0 - 15.0 g/dL   HCT 31.3 (L) 36 - 46 %   MCV 93.4 80.0 - 100.0 fL   MCH 29.0 26.0 - 34.0 pg   MCHC 31.0 30.0 - 36.0 g/dL   RDW 14.7 11.5 - 15.5 %   Platelets 81 (L) 150 - 400 K/uL    Comment: REPEATED TO VERIFY SPECIMEN CHECKED FOR CLOTS Immature Platelet Fraction may be clinically indicated, consider ordering this additional test [id number] CONSISTENT WITH PREVIOUS RESULT    nRBC 0.0 0.0 - 0.2 %   Neutrophils Relative % 87 %   Neutro Abs 11.1 (H) 1.7 - 7.7 K/uL   Lymphocytes Relative 5 %   Lymphs Abs 0.6 (L) 0.7 - 4.0 K/uL   Monocytes Relative 5 %   Monocytes Absolute 0.6 0.1 - 1.0 K/uL   Eosinophils Relative 0 %   Eosinophils Absolute 0.0 0.0 - 0.5 K/uL   Basophils Relative 0 %   Basophils Absolute 0.0 0.0 - 0.1 K/uL    Immature Granulocytes 3 %   Abs Immature Granulocytes 0.33 (H) 0.00 - 0.07 K/uL    Comment: Performed at [facility], [place] [address] [place], [place] [address]  Protime-INR     Status: None   Collection Time: 08/17/20  1:22 PM  Result Value Ref Range   Prothrombin Time 15.1 11.4 - 15.2 seconds   INR 1.2 0.8 - 1.2    Comment: (NOTE) INR goal varies based on device and disease states. Performed at [facility], [place] [address] [place], [place] [address]   Glucose, capillary     Status: Abnormal   Collection Time: 08/17/20 10:49 PM  Result Value Ref Range   Glucose-Capillary 149 (H) 70 - 99 mg/dL    Comment: Glucose reference range applies only to samples taken after fasting for at least 8 hours.  Surgical PCR screen     Status: Abnormal   Collection Time: 08/18/20 12:48 AM   Specimen: Nasal Mucosa; Nasal Swab  Result Value Ref Range   MRSA, PCR NEGATIVE NEGATIVE   Staphylococcus aureus POSITIVE (A) NEGATIVE    Comment: (NOTE) The Xpert SA Assay (FDA approved for NASAL specimens in patients 89 years of age and older), is one component of a comprehensive surveillance program. It is not intended to diagnose infection nor to guide or monitor treatment. Performed at [facility], [place] [address], [place], [place] [address]  Comprehensive metabolic panel     Status: Abnormal   Collection Time: 08/18/20  2:55 AM  Result Value Ref Range   Sodium 137 135 - 145 mmol/L   Potassium 3.9 3.5 - 5.1 mmol/L   Chloride 106 98 - 111 mmol/L   CO2 23 22 - 32 mmol/L   Glucose, Bld 134 (H) 70 - 99 mg/dL    Comment: Glucose reference range applies only to samples taken after fasting for at least 8 hours.   BUN 21 8 - 23 mg/dL   Creatinine, Ser 1.01 (H) 0.44 - 1.00 mg/dL   Calcium 8.7 (L) 8.9 - 10.3 mg/dL   Total Protein 6.1 (L) 6.5 - 8.1 g/dL   Albumin 3.4 (L) 3.5 - 5.0 g/dL   AST 26 15 - 41 U/L   ALT 21 0 - 44 U/L   Alkaline  Phosphatase 53 38 - 126 U/L   Total Bilirubin 1.2 0.3 - 1.2 mg/dL   GFR, Estimated 49 (L) >60 mL/min   Anion gap 8 5 - 15    Comment: Performed at [facility], [place] [address], [place], [place] [address]  CBC     Status: Abnormal   Collection Time: 08/18/20  2:55 AM  Result Value Ref Range   WBC 15.9 (H) 4.0 - 10.5 K/uL   RBC 3.09 (L) 3.87 - 5.11 MIL/uL   Hemoglobin 9.1 (L) 12.0 - 15.0 g/dL   HCT 29.2 (L) 36 - 46 %   MCV 94.5 80.0 - 100.0 fL   MCH 29.4 26.0 - 34.0 pg   MCHC 31.2 30.0 - 36.0 g/dL   RDW 14.9 11.5 - 15.5 %   Platelets 81 (L) 150 - 400 K/uL    Comment: REPEATED TO VERIFY SPECIMEN CHECKED FOR CLOTS Immature Platelet Fraction may be clinically indicated, consider ordering this additional test [id number] CONSISTENT WITH PREVIOUS RESULT    nRBC 0.0 0.0 - 0.2 %    Comment: Performed at [facility], [place] [address] [place], [place] [address]    DG Elbow 2 Views Left  Result Date: 08/17/2020 CLINICAL DATA:  Left elbow pain since a fall this morning. Initial encounter. EXAM: LEFT ELBOW - 2 VIEW COMPARISON:  None. FINDINGS: There is no evidence of fracture, dislocation, or joint effusion. There is no evidence of arthropathy or other focal bone abnormality. Soft tissues are unremarkable. IMPRESSION: Negative exam. Electronically Signed   By: Inge Rise M.D.   On: 08/17/2020 15:28   DG Wrist 2 Views Left  Result Date: 08/17/2020 CLINICAL DATA:  Left wrist pain after a fall this morning. EXAM: LEFT WRIST - 2 VIEW COMPARISON:  None. FINDINGS: There is no evidence of fracture or dislocation. There is no evidence of arthropathy or other focal bone abnormality. Ulnar minus variance noted. Soft tissues are unremarkable. IMPRESSION: No acute abnormality. Electronically Signed   By: Inge Rise M.D.   On: 08/17/2020 15:29   CT PELVIS WO CONTRAST  Result Date: 08/17/2020 CLINICAL DATA:  Fall with left hip pain. EXAM: CT PELVIS WITHOUT  CONTRAST TECHNIQUE: Multidetector CT imaging of the pelvis was performed following the standard protocol without intravenous contrast. COMPARISON:  None. FINDINGS: Urinary Tract: Mild bladder wall thickening noted with incomplete bladder distention. Bowel:  Unremarkable visualized pelvic bowel loops. Vascular/Lymphatic: Atherosclerotic calcification noted distal aorta and common iliac arteries. No pelvic sidewall lymphadenopathy. Reproductive:  Unremarkable. Other:  No intraperitoneal free fluid. Musculoskeletal: Comminuted fracture of the left superior and inferior pubic rami. Superior  ramus fracture extends into the left pubic bone. There is extensive extraperitoneal hemorrhage in the left pelvic floor extending into the anterior abdominal wall and displacing the bladder cranially and to the right. There is an associated fracture of the left sacrum extending into the SI joint without diastasis. IMPRESSION: 1. Comminuted fracture of the left superior and inferior pubic rami with extension into the left pubic bone. 2. Associated fracture of the left sacrum extending into the SI joint without diastasis. 3. Extensive extraperitoneal hemorrhage in the left anterior pelvic floor extending into the anterior abdominal wall and displacing the bladder cranially and to the right. 4. Aortic Atherosclerosis (ICD10-I70.0). Electronically Signed   By: Misty Stanley M.D.   On: 08/17/2020 07:12   CT ABDOMEN PELVIS W CONTRAST  Result Date: 08/17/2020 CLINICAL DATA:  Pelvic fractures with hemorrhage EXAM: CT ABDOMEN AND PELVIS WITH CONTRAST TECHNIQUE: Multidetector CT imaging of the abdomen and pelvis was performed using the standard protocol following bolus administration of intravenous contrast. CONTRAST:  140mL OMNIPAQUE IOHEXOL 300 MG/ML  SOLN COMPARISON:  Pelvis CT earlier same day FINDINGS: Lower chest: Mild scarring at the lung bases. Partially imaged cardiomegaly. Hepatobiliary: Small hepatic cysts and other too small  to characterize hypodense lesions. No hepatic injury or perihepatic hematoma. Gallbladder is unremarkable. Prominence of the proximal common bile duct with tapering. Pancreas: Unremarkable apart from some atrophy. Spleen: Too small to characterize hypodense lesion. No splenic injury or perisplenic hematoma. Adrenals/Urinary Tract: Bladder is displaced superiorly into the right by hemorrhage. Integrity of the bladder is not evaluated on this study. Small right renal cysts. Adrenals are unremarkable. Stomach/Bowel: Stomach is within normal limits. Bowel is normal in caliber. Vascular/Lymphatic: Diffuse area iliac atherosclerosis. No enlarged lymph nodes identified. Reproductive: Uterus and bilateral adnexa are unremarkable. Musculoskeletal: Left pelvic and sacral fractures as previously described. Associated extraperitoneal hemorrhage along the ventral abdominal wall and left pelvic sidewall extending into the left paracolic gutter. No substantial change. Chronic appearing severe L1 compression fracture. IMPRESSION: Pelvic and sacral fractures as described previously. Associated extraperitoneal hemorrhage is not substantially changed. No evidence of acute traumatic injury in the abdomen. Electronically Signed   By: Macy Mis M.D.   On: 08/17/2020 13:09   DG CHEST PORT 1 VIEW  Result Date: 08/17/2020 CLINICAL DATA:  Left hip and elbow pain post fall EXAM: PORTABLE CHEST 1 VIEW COMPARISON:  Radiograph 05/26/2019 FINDINGS: Low lung volumes with some atelectatic changes. Chronic biapical pleuroparenchymal scarring and coarsened reticular changes are similar to comparison exams. Postsurgical changes related to prior CABG including intact and aligned sternotomy wires and multiple surgical clips projecting over the mediastinum. Prominent cardiac silhouette likely accentuated by the portable technique with a calcified aorta. Pacer pack overlies the left chest wall with single lead at the cardiac apex similar to  prior. No acute osseous or soft tissue abnormality. Degenerative changes are present in the imaged spine and shoulders. Features of right shoulder calcific tendinosis/hydroxyapatite deposition. IMPRESSION: No acute cardiopulmonary abnormality. No acute traumatic findings in the chest. Aortic Atherosclerosis (ICD10-I70.0). Prior sternotomy and CABG. Well seated pacer lead in stable position. Electronically Signed   By: Lovena Le M.D.   On: 08/17/2020 15:03   CT HEAD CODE STROKE WO CONTRAST  Result Date: 08/17/2020 CLINICAL DATA:  Code stroke.  Left-sided facial droop EXAM: CT HEAD WITHOUT CONTRAST TECHNIQUE: Contiguous axial images were obtained from the base of the skull through the vertex without intravenous contrast. COMPARISON:  None. FINDINGS: Brain: There is no mass, hemorrhage or  extra-axial collection. There is generalized atrophy without lobar predilection. There is hypoattenuation of the periventricular white matter, most commonly indicating chronic ischemic microangiopathy. Vascular: No abnormal hyperdensity of the major intracranial arteries or dural venous sinuses. No intracranial atherosclerosis. Skull: The visualized skull base, calvarium and extracranial soft tissues are normal. Sinuses/Orbits: No fluid levels or advanced mucosal thickening of the visualized paranasal sinuses. No mastoid or middle ear effusion. The orbits are normal. ASPECTS [facility] Stroke Program Early CT Score) - Ganglionic level infarction (caudate, lentiform nuclei, internal capsule, insula, M1-M3 cortex): 7 - Supraganglionic infarction (M4-M6 cortex): 3 Total score (0-10 with 10 being normal): 10 IMPRESSION: 1. No acute intracranial abnormality. 2. ASPECTS is 10. 3. Severe chronic ischemic changes of the white matter. These results were called by telephone at the time of interpretation on 08/17/2020 at 11:36 pm to provider Lang Snow , who verbally acknowledged these results. Electronically Signed   By: Ulyses Jarred  M.D.   On: 08/17/2020 23:37    ROS positive for diabetes hypertension.  Positive for atrial fibrillation previously on anticoagulant  therapy stopped after problems with a ruptured Baker's cyst behind her knee.  Negative for previous CVA or heart attack.  All other systems are noncontributory to HPI. Blood pressure (!) 161/58, pulse 94, temperature 98.4 F (36.9 C), temperature source Oral, resp. rate (!) 22, height 5\' 6"  (1.676 m), weight 63 kg, SpO2 99 %. Physical Exam Constitutional:      Appearance: Normal appearance.  HENT:     Right Ear: External ear normal.     Left Ear: External ear normal.  Eyes:     Extraocular Movements: Extraocular movements intact.  Pulmonary:     Effort: Pulmonary effort is normal.     Breath sounds: Rhonchi present.  Abdominal:     General: Abdomen is flat.  Neurological:     Mental Status: She is alert.     Comments: Left UE and LE weakness.      Assessment/Plan: Pelvic ring fracture comminuted rami on the left with posterior sacral fracture extending into the sacroiliac joint without diastases.  She has reassessment for problems with swallowing today.  No surgery is indicated for her pelvic fracture.  After number days once pain is better then gradual mobility can be started depending on her neurologic status with left-sided weakness.  We will continue to follow her.  After 5 days she could be restarted on anticoagulation therapy if indicated for atrial fibrillation but I would avoid early anticoagulation at this time due to significant risk of hemorrhage.  My cell phone [phone].  Marybelle Killings 08/18/2020, 8:27 AM

## 2020-08-18 NOTE — Progress Notes (Signed)
[facility]  PROGRESS NOTE  Susan Conway TIW:[id number] DOB: 08-26-1929 DOA: 08/17/2020 PCP: [name], DO   Brief HPI:   86 year old female with history of atrial fibrillation who was admitted with pelvic and sacral fracture with extraperitoneal hemorrhage after patient fell at home.  Orthopedic surgery and trauma surgery were consulted.  Patient also developed new left-sided weakness last night and was seen by tele neurology for code stroke.  CT head was negative for stroke    Subjective   Patient seen and examined, continues to have mild aphasia and left-sided weakness   Assessment/Plan:     1. Left-sided weakness-concerning for stroke.  CT head was negative for CVA.  Continue neurochecks, will obtain MRI/MRA brain.  Patient cannot receive antiplatelet therapy due to retroperitoneal hemorrhage.  Antithrombotics are also on hold due to the same. 2. Pelvic/sacral fracture-patient has sacral hematoma, orthopedic surgery recommending no surgical intervention at this time.  Patient can be started on antithrombotic agents if needed in 5 days as per orthopedics. 3. Paroxysmal atrial fibrillation-patient has PAF with sinus node dysfunction, s/p PPM.  Patient is not on anticoagulation.  Currently patient is n.p.o.  Will hold p.o. atenolol and digoxin.  Start metoprolol 2.5 mg IV every 6 hours. 4. Thrombocytopenia-platelet count is 81,000, unclear etiology.  Follow peripheral smear review.     COVID-19 Labs  No results for input(s): DDIMER, FERRITIN, LDH, CRP in the last 72 hours.  Lab Results  Component Value Date   SARSCOV2NAA NEGATIVE 08/17/2020   [place] NEGATIVE 05/23/2019     Scheduled medications:   . acetaminophen  1,000 mg Oral Q6H  . calcium-vitamin D  1 tablet Oral Daily  . Chlorhexidine Gluconate Cloth  6 each Topical Daily  . lidocaine  1 patch Transdermal Q24H  . LORazepam  1 mg Oral QHS  . metoprolol tartrate  2.5 mg Intravenous Q8H  . mupirocin  ointment  1 application Nasal BID         CBG: Recent Labs  Lab 08/17/20 2249  GLUCAP 149*    SpO2: 100 % O2 Flow Rate (L/min): 1 L/min    CBC: Recent Labs  Lab 08/17/20 0731 08/17/20 1322 08/18/20 0255  WBC 15.5* 12.7* 15.9*  NEUTROABS 11.9* 11.1*  --   HGB 11.2* 9.7* 9.1*  HCT 35.0* 31.3* 29.2*  MCV 92.3 93.4 94.5  PLT 84* 81* 81*    Basic Metabolic Panel: Recent Labs  Lab 08/17/20 0731 08/18/20 0255  NA 142 137  K 3.7 3.9  CL 105 106  CO2 24 23  GLUCOSE 112* 134*  BUN 24* 21  CREATININE 0.89 1.01*  CALCIUM 9.3 8.7*     Liver Function Tests: Recent Labs  Lab 08/17/20 0731 08/18/20 0255  AST 32 26  ALT 24 21  ALKPHOS 62 53  BILITOT 1.2 1.2  PROT 6.5 6.1*  ALBUMIN 3.7 3.4*     Antibiotics: Anti-infectives (From admission, onward)   None       DVT prophylaxis: SCDs  Code Status: Full code  Family Communication: Discussed with patient's daughter at bedside.    Status is: Inpatient  Dispo: The patient is from: Home              Anticipated d/c is to: Home              Anticipated d/c date is: 08/20/2020              Patient currently not medically stable for discharge  Barrier  to discharge-ongoing work-up for stroke, pelvic and sacral fracture with extraperitoneal hematoma  Pressure Injury 08/18/20 Knee Left;Posterior Stage 1 -  Intact skin with non-blanchable redness of a localized area usually over a bony prominence. (Active)  08/18/20 0200  Location: Knee  Location Orientation: Left;Posterior  Staging: Stage 1 -  Intact skin with non-blanchable redness of a localized area usually over a bony prominence.  Wound Description (Comments):   Present on Admission: Yes         Consultants:  Orthopedics  Trauma surgery  Procedures:     Objective   Vitals:   08/18/20 1155 08/18/20 1158 08/18/20 1200 08/18/20 1303  BP:   (!) 164/70 (!) 160/85  Pulse:  93 92 100  Resp:  (!) 26 (!) 25 (!) 29  Temp: 97.6 F (36.4  C)     TempSrc: Oral     SpO2:  100% 98% 100%  Weight:      Height:        Intake/Output Summary (Last 24 hours) at 08/18/2020 1305 Last data filed at 08/18/2020 0800 Gross per 24 hour  Intake 846.23 ml  Output 350 ml  Net 496.23 ml    10/11 1901 - 10/13 0700 In: 846.2 [P.O.:120; I.V.:726.2] Out: 350 [Urine:350]  Filed Weights   08/17/20 0529 08/17/20 1927 08/18/20 0230  Weight: 61.2 kg 61.4 kg 63 kg    Physical Examination:    General: Appears in no acute distress  Cardiovascular: S1-S2, regular, no murmur auscultated  Respiratory: Clear to auscultation bilaterally  Abdomen: Abdomen is soft, nontender, no organomegaly  Extremities: No edema in the lower extremities  Neurologic: Alert, oriented x3, mild aphasia, left hemiparesis    Data Reviewed:   Recent Results (from the past 240 hour(s))  Respiratory Panel by RT PCR (Flu A&B, Covid) - Nasopharyngeal Swab     Status: None   Collection Time: 08/17/20  7:07 AM   Specimen: Nasopharyngeal Swab  Result Value Ref Range Status   SARS Coronavirus 2 by RT PCR NEGATIVE NEGATIVE Final    Comment: (NOTE) SARS-CoV-2 target nucleic acids are NOT DETECTED.  The SARS-CoV-2 RNA is generally detectable in upper respiratoy specimens during the acute phase of infection. The lowest concentration of SARS-CoV-2 viral copies this assay can detect is 131 copies/mL. A negative result does not preclude SARS-Cov-2 infection and should not be used as the sole basis for treatment or other patient management decisions. A negative result may occur with  improper specimen collection/handling, submission of specimen other than nasopharyngeal swab, presence of viral mutation(s) within the areas targeted by this assay, and inadequate number of viral copies (<131 copies/mL). A negative result must be combined with clinical observations, patient history, and epidemiological information. The expected result is Negative.  Fact Sheet for  Patients:  PinkCheek.be  Fact Sheet for Healthcare Providers:  GravelBags.it  This test is no t yet approved or cleared by the [place] FDA and  has been authorized for detection and/or diagnosis of SARS-CoV-2 by FDA under an Emergency Use Authorization (EUA). This EUA will remain  in effect (meaning this test can be used) for the duration of the COVID-19 declaration under Section 564(b)(1) of the Act, 21 U.S.C. section 360bbb-3(b)(1), unless the authorization is terminated or revoked sooner.     Influenza A by PCR NEGATIVE NEGATIVE Final   Influenza B by PCR NEGATIVE NEGATIVE Final    Comment: (NOTE) The Xpert Xpress SARS-CoV-2/FLU/RSV assay is intended as an aid in  the diagnosis of influenza  from Nasopharyngeal swab specimens and  should not be used as a sole basis for treatment. Nasal washings and  aspirates are unacceptable for Xpert Xpress SARS-CoV-2/FLU/RSV  testing.  Fact Sheet for Patients: PinkCheek.be  Fact Sheet for Healthcare Providers: GravelBags.it  This test is not yet approved or cleared by the [place] FDA and  has been authorized for detection and/or diagnosis of SARS-CoV-2 by  FDA under an Emergency Use Authorization (EUA). This EUA will remain  in effect (meaning this test can be used) for the duration of the  Covid-19 declaration under Section 564(b)(1) of the Act, 21  U.S.C. section 360bbb-3(b)(1), unless the authorization is  terminated or revoked. Performed at [facility], [place] [address], [place], [place] [address]   Surgical PCR screen     Status: Abnormal   Collection Time: 08/18/20 12:48 AM   Specimen: Nasal Mucosa; Nasal Swab  Result Value Ref Range Status   MRSA, PCR NEGATIVE NEGATIVE Final   Staphylococcus aureus POSITIVE (A) NEGATIVE Final    Comment: (NOTE) The Xpert SA Assay (FDA approved for  NASAL specimens in patients 40 years of age and older), is one component of a comprehensive surveillance program. It is not intended to diagnose infection nor to guide or monitor treatment. Performed at [facility], [place] [address] [place], [place] [address]     No results for input(s): LIPASE, AMYLASE in the last 168 hours. No results for input(s): AMMONIA in the last 168 hours.  Cardiac Enzymes: No results for input(s): CKTOTAL, CKMB, CKMBINDEX, TROPONINI in the last 168 hours. BNP (last 3 results) No results for input(s): BNP in the last 8760 hours.  ProBNP (last 3 results) No results for input(s): PROBNP in the last 8760 hours.  Studies:  DG Elbow 2 Views Left  Result Date: 08/17/2020 CLINICAL DATA:  Left elbow pain since a fall this morning. Initial encounter. EXAM: LEFT ELBOW - 2 VIEW COMPARISON:  None. FINDINGS: There is no evidence of fracture, dislocation, or joint effusion. There is no evidence of arthropathy or other focal bone abnormality. Soft tissues are unremarkable. IMPRESSION: Negative exam. Electronically Signed   By: Inge Rise M.D.   On: 08/17/2020 15:28   DG Wrist 2 Views Left  Result Date: 08/17/2020 CLINICAL DATA:  Left wrist pain after a fall this morning. EXAM: LEFT WRIST - 2 VIEW COMPARISON:  None. FINDINGS: There is no evidence of fracture or dislocation. There is no evidence of arthropathy or other focal bone abnormality. Ulnar minus variance noted. Soft tissues are unremarkable. IMPRESSION: No acute abnormality. Electronically Signed   By: Inge Rise M.D.   On: 08/17/2020 15:29   CT PELVIS WO CONTRAST  Result Date: 08/17/2020 CLINICAL DATA:  Fall with left hip pain. EXAM: CT PELVIS WITHOUT CONTRAST TECHNIQUE: Multidetector CT imaging of the pelvis was performed following the standard protocol without intravenous contrast. COMPARISON:  None. FINDINGS: Urinary Tract: Mild bladder wall thickening noted with incomplete bladder  distention. Bowel:  Unremarkable visualized pelvic bowel loops. Vascular/Lymphatic: Atherosclerotic calcification noted distal aorta and common iliac arteries. No pelvic sidewall lymphadenopathy. Reproductive:  Unremarkable. Other:  No intraperitoneal free fluid. Musculoskeletal: Comminuted fracture of the left superior and inferior pubic rami. Superior ramus fracture extends into the left pubic bone. There is extensive extraperitoneal hemorrhage in the left pelvic floor extending into the anterior abdominal wall and displacing the bladder cranially and to the right. There is an associated fracture of the left sacrum extending into the SI joint  without diastasis. IMPRESSION: 1. Comminuted fracture of the left superior and inferior pubic rami with extension into the left pubic bone. 2. Associated fracture of the left sacrum extending into the SI joint without diastasis. 3. Extensive extraperitoneal hemorrhage in the left anterior pelvic floor extending into the anterior abdominal wall and displacing the bladder cranially and to the right. 4. Aortic Atherosclerosis (ICD10-I70.0). Electronically Signed   By: Misty Stanley M.D.   On: 08/17/2020 07:12   CT ABDOMEN PELVIS W CONTRAST  Result Date: 08/17/2020 CLINICAL DATA:  Pelvic fractures with hemorrhage EXAM: CT ABDOMEN AND PELVIS WITH CONTRAST TECHNIQUE: Multidetector CT imaging of the abdomen and pelvis was performed using the standard protocol following bolus administration of intravenous contrast. CONTRAST:  163mL OMNIPAQUE IOHEXOL 300 MG/ML  SOLN COMPARISON:  Pelvis CT earlier same day FINDINGS: Lower chest: Mild scarring at the lung bases. Partially imaged cardiomegaly. Hepatobiliary: Small hepatic cysts and other too small to characterize hypodense lesions. No hepatic injury or perihepatic hematoma. Gallbladder is unremarkable. Prominence of the proximal common bile duct with tapering. Pancreas: Unremarkable apart from some atrophy. Spleen: Too small to  characterize hypodense lesion. No splenic injury or perisplenic hematoma. Adrenals/Urinary Tract: Bladder is displaced superiorly into the right by hemorrhage. Integrity of the bladder is not evaluated on this study. Small right renal cysts. Adrenals are unremarkable. Stomach/Bowel: Stomach is within normal limits. Bowel is normal in caliber. Vascular/Lymphatic: Diffuse area iliac atherosclerosis. No enlarged lymph nodes identified. Reproductive: Uterus and bilateral adnexa are unremarkable. Musculoskeletal: Left pelvic and sacral fractures as previously described. Associated extraperitoneal hemorrhage along the ventral abdominal wall and left pelvic sidewall extending into the left paracolic gutter. No substantial change. Chronic appearing severe L1 compression fracture. IMPRESSION: Pelvic and sacral fractures as described previously. Associated extraperitoneal hemorrhage is not substantially changed. No evidence of acute traumatic injury in the abdomen. Electronically Signed   By: Macy Mis M.D.   On: 08/17/2020 13:09   DG CHEST PORT 1 VIEW  Result Date: 08/17/2020 CLINICAL DATA:  Left hip and elbow pain post fall EXAM: PORTABLE CHEST 1 VIEW COMPARISON:  Radiograph 05/26/2019 FINDINGS: Low lung volumes with some atelectatic changes. Chronic biapical pleuroparenchymal scarring and coarsened reticular changes are similar to comparison exams. Postsurgical changes related to prior CABG including intact and aligned sternotomy wires and multiple surgical clips projecting over the mediastinum. Prominent cardiac silhouette likely accentuated by the portable technique with a calcified aorta. Pacer pack overlies the left chest wall with single lead at the cardiac apex similar to prior. No acute osseous or soft tissue abnormality. Degenerative changes are present in the imaged spine and shoulders. Features of right shoulder calcific tendinosis/hydroxyapatite deposition. IMPRESSION: No acute cardiopulmonary  abnormality. No acute traumatic findings in the chest. Aortic Atherosclerosis (ICD10-I70.0). Prior sternotomy and CABG. Well seated pacer lead in stable position. Electronically Signed   By: Lovena Le M.D.   On: 08/17/2020 15:03   CT HEAD CODE STROKE WO CONTRAST  Result Date: 08/17/2020 CLINICAL DATA:  Code stroke.  Left-sided facial droop EXAM: CT HEAD WITHOUT CONTRAST TECHNIQUE: Contiguous axial images were obtained from the base of the skull through the vertex without intravenous contrast. COMPARISON:  None. FINDINGS: Brain: There is no mass, hemorrhage or extra-axial collection. There is generalized atrophy without lobar predilection. There is hypoattenuation of the periventricular white matter, most commonly indicating chronic ischemic microangiopathy. Vascular: No abnormal hyperdensity of the major intracranial arteries or dural venous sinuses. No intracranial atherosclerosis. Skull: The visualized skull base, calvarium and extracranial  soft tissues are normal. Sinuses/Orbits: No fluid levels or advanced mucosal thickening of the visualized paranasal sinuses. No mastoid or middle ear effusion. The orbits are normal. ASPECTS [facility] Stroke Program Early CT Score) - Ganglionic level infarction (caudate, lentiform nuclei, internal capsule, insula, M1-M3 cortex): 7 - Supraganglionic infarction (M4-M6 cortex): 3 Total score (0-10 with 10 being normal): 10 IMPRESSION: 1. No acute intracranial abnormality. 2. ASPECTS is 10. 3. Severe chronic ischemic changes of the white matter. These results were called by telephone at the time of interpretation on 08/17/2020 at 11:36 pm to provider Lang Snow , who verbally acknowledged these results. Electronically Signed   By: Ulyses Jarred M.D.   On: 08/17/2020 23:37   VAS US CAROTID  Result Date: 08/18/2020 Carotid Arterial Duplex Study Indications:       Stroke, new left-sided weakness. Comparison Study:  No prior studies. Performing Technologist: Darlin Coco   Examination Guidelines: A complete evaluation includes B-mode imaging, spectral Doppler, color Doppler, and power Doppler as needed of all accessible portions of each vessel. Bilateral testing is considered an integral part of a complete examination. Limited examinations for reoccurring indications may be performed as noted.  Right Carotid Findings: +----------+--------+--------+--------+------------------+------------------+           PSV cm/sEDV cm/sStenosisPlaque DescriptionComments           +----------+--------+--------+--------+------------------+------------------+ CCA Prox  53      11                                intimal thickening +----------+--------+--------+--------+------------------+------------------+ CCA Distal56      12              heterogenous                         +----------+--------+--------+--------+------------------+------------------+ ICA Prox  49      17      1-39%   heterogenous                         +----------+--------+--------+--------+------------------+------------------+ ICA Distal85      27                                                   +----------+--------+--------+--------+------------------+------------------+ ECA       64                                                           +----------+--------+--------+--------+------------------+------------------+ +----------+--------+-------+----------------+-------------------+           PSV cm/sEDV cmsDescribe        Arm Pressure (mmHG) +----------+--------+-------+----------------+-------------------+ [id number]             Multiphasic, WNL                    +----------+--------+-------+----------------+-------------------+ +---------+--------+--+--------+--+---------+ VertebralPSV cm/s60EDV cm/s26Antegrade +---------+--------+--+--------+--+---------+  Left Carotid Findings: +----------+--------+--------+--------+------------------+------------------+            PSV cm/sEDV cm/sStenosisPlaque DescriptionComments           +----------+--------+--------+--------+------------------+------------------+ CCA Prox  77      17  intimal thickening +----------+--------+--------+--------+------------------+------------------+ CCA Distal85      23              calcific                             +----------+--------+--------+--------+------------------+------------------+ ICA Prox  74      21      1-39%   heterogenous                         +----------+--------+--------+--------+------------------+------------------+ ICA Distal92      27                                tortuous           +----------+--------+--------+--------+------------------+------------------+ ECA       82                                                           +----------+--------+--------+--------+------------------+------------------+ +----------+--------+--------+----------------+-------------------+           PSV cm/sEDV cm/sDescribe        Arm Pressure (mmHG) +----------+--------+--------+----------------+-------------------+ [id number]             Multiphasic, WNL                    +----------+--------+--------+----------------+-------------------+ +---------+--------+--+--------+--+---------+ VertebralPSV cm/s75EDV cm/s12Antegrade +---------+--------+--+--------+--+---------+   Summary: Right Carotid: Velocities in the right ICA are consistent with a 1-39% stenosis. Left Carotid: Velocities in the left ICA are consistent with a 1-39% stenosis. Vertebrals:  Bilateral vertebral arteries demonstrate antegrade flow. Subclavians: Normal flow hemodynamics were seen in bilateral subclavian              arteries. *See table(s) above for measurements and observations.  Electronically signed by Antony Contras MD on 08/18/2020 at 10:49:24 AM.    Final        [facility] If 7PM-7AM, please  contact night-coverage at [email or web address], Office  [phone]   08/18/2020, 1:05 PM  LOS: 1 day

## 2020-08-18 NOTE — TOC Initial Note (Signed)
Transition of Care [facility]) - Initial/Assessment Note    Patient Details  Name: Susan Conway MRN: [id number] Date of Birth: 1928/11/22  Transition of Care [facility]) CM/SW Contact:    Leeroy Cha, RN Phone Number: 08/18/2020, 8:33 AM  Clinical Narrative:                 86 y/o F who presented to the [facility] ED via EMS after a ground level fall. Patient states she got out of bed this morning at 0430 to go to the bathroom. While she was turning around to go to the bathroom she slipped on the tile floor in her bathroom, landing on her left hip. She denies hitting her head. Denies LOC. Reports significant left hip pain and mild left elbow pain. Denies the use of blood thinning medications including aspirin. Requests something to drink. At baseline she live alone in an independent living facility, states her husband is deceased.  o2 at 2l/min via [place], iv robaxin, iv ns at 75cc/hr,ortho following Will follow for progression of care and possible snf placment short term. Expected Discharge Plan: Skilled Nursing Facility Barriers to Discharge: Barriers Unresolved (comment)   Patient Goals and CMS Choice Patient states their goals for this hospitalization and ongoing recovery are:: to go back to [facility] CMS Medicare.gov Compare Post Acute Care list provided to:: Patient    Expected Discharge Plan and Services Expected Discharge Plan: [place]   Discharge Planning Services: CM Consult   Living arrangements for the past 2 months: [place]                                      Prior Living Arrangements/Services Living arrangements for the past 2 months: [place] Lives with:: Self Patient language and need for interpreter reviewed:: Yes Do you feel safe going back to the place where you live?: Yes      Need for Family Participation in Patient Care: Yes (Comment) Care giver support system in place?: Yes (comment)   Criminal  Activity/Legal Involvement Pertinent to Current Situation/Hospitalization: No - Comment as needed  Activities of Daily Living Home Assistive Devices/Equipment: Hearing aid, Walker (specify type), Grab bars in shower (bilateral hearing aides) ADL Screening (condition at time of admission) Patient's cognitive ability adequate to safely complete daily activities?: Yes Is the patient deaf or have difficulty hearing?: Yes (wears 2 hearing aides) Does the patient have difficulty seeing, even when wearing glasses/contacts?: No (hx bilateral cataract surgery) Does the patient have difficulty concentrating, remembering, or making decisions?: No Patient able to express need for assistance with ADLs?: Yes Does the patient have difficulty dressing or bathing?: No Independently performs ADLs?: Yes (appropriate for developmental age) Does the patient have difficulty walking or climbing stairs?: Yes Weakness of Legs: Left (hx baker's cyst on Left leg, also pain from left leg and hip from fall) Weakness of Arms/Hands: None  Permission Sought/Granted                  Emotional Assessment Appearance:: Appears stated age Attitude/Demeanor/Rapport: Engaged Affect (typically observed): Calm Orientation: : Oriented to Place, Oriented to Self, Oriented to  Time, Oriented to Situation Alcohol / Substance Use: Not Applicable Psych Involvement: No (comment)  Admission diagnosis:  Pelvic fracture ([place]) [S32.9XXA] Fall [W19.XXXA] Fall, initial encounter [id number]] Closed displaced fracture of left pubis, initial encounter ([place]) [S32.502A] Closed fracture of sacrum, unspecified portion  of sacrum, initial encounter ([place]) [S32.10XA] Hematoma of extraperitoneal space [A54.098J] Patient Active Problem List   Diagnosis Date Noted  . Pelvic fracture ([place]) 08/17/2020  . Hemorrhage 03/04/2020  . Asymptomatic varicose veins of both lower extremities 01/29/2020  . Onychomycosis of left great toe 01/29/2020  .  Xerosis cutis 01/29/2020  . Ventricular tachycardia ([place]) 10/22/2019  . Presence of cardiac pacemaker 10/22/2019  . Atrioventricular block, complete ([place]) 05/26/2019  . Gastroesophageal reflux disease without esophagitis 10/02/2018  . Primary osteoarthritis involving multiple joints 10/02/2018  . Hypercoagulable state due to atrial fibrillation ([place]) 10/02/2018  . Trigger middle finger of right hand 10/26/2017  . Ganglion, left wrist 11/12/2015  . Lower GI bleed   . Internal bleeding hemorrhoids   . Rectal ulcer   . Constipation   . Rectal bleeding 10/17/2015  . Actinic keratosis 05/25/2014  . Basal cell carcinoma 05/25/2014  . Leg cramp 10/27/2013  . Tennis elbow 10/27/2013  . Pain in joint, ankle and foot 10/27/2013  . Pain in joint, shoulder region 06/21/2013  . Cervicalgia 06/21/2013  . Hyperglycemia 04/28/2013  . Hemorrhoids 09/16/2012  . Coronary artery disease involving native coronary artery of native heart without angina pectoris 09/12/2010  . Atrial fibrillation ([place]) 09/07/2010  . Long term (current) use of anticoagulants 09/06/2010  . Hyperlipidemia 03/17/2010  . OLD MYOCARDIAL INFARCTION 03/16/2010  . PREMATURE VENTRICULAR CONTRACTIONS 03/16/2010  . Osteoporosis 05/13/2009  . Meralgia paresthetica 07/08/2007  . Essential hypertension 07/17/1997   PCP:  [name], DO Pharmacy:   [id number] [place], [address] AT [address] [place] [address] Phone: [phone] Fax: [phone]  [facility] [place] [address] [facility] [address] [facility] [address] [place] [address] Phone: [phone] Fax: [phone]     Social Determinants of Health (SDOH) Interventions    Readmission Risk Interventions No flowsheet data found.

## 2020-08-18 NOTE — Progress Notes (Signed)
MRI results being reviewed by Dr. Darrick Meigs and neuro. Neuro recommending follow-up MRI w/contrast. This RN will check to see if it contains iodinated agents d/t allergy.

## 2020-08-19 ENCOUNTER — Inpatient Hospital Stay (HOSPITAL_COMMUNITY): Payer: Medicare Other

## 2020-08-19 DIAGNOSIS — L8990 Pressure ulcer of unspecified site, unspecified stage: Secondary | ICD-10-CM | POA: Insufficient documentation

## 2020-08-19 DIAGNOSIS — S32502A Unspecified fracture of left pubis, initial encounter for closed fracture: Secondary | ICD-10-CM | POA: Diagnosis not present

## 2020-08-19 DIAGNOSIS — S3210XA Unspecified fracture of sacrum, initial encounter for closed fracture: Secondary | ICD-10-CM | POA: Diagnosis not present

## 2020-08-19 DIAGNOSIS — S36892A Contusion of other intra-abdominal organs, initial encounter: Secondary | ICD-10-CM | POA: Diagnosis not present

## 2020-08-19 DIAGNOSIS — I639 Cerebral infarction, unspecified: Secondary | ICD-10-CM

## 2020-08-19 DIAGNOSIS — S32402A Unspecified fracture of left acetabulum, initial encounter for closed fracture: Secondary | ICD-10-CM | POA: Diagnosis not present

## 2020-08-19 LAB — LDL CHOLESTEROL, DIRECT: Direct LDL: 81.3 mg/dL (ref 0–99)

## 2020-08-19 IMAGING — MR MR MRA NECK WO/W CM
4 series · 31 of 48 positions shown · IV contrast (gadavist)
Comparison: MRI head and MRA head [DATE]

CLINICAL DATA: Stroke.  Rule out vertebral artery dissection.

EXAM:
MRA NECK WITHOUT AND WITH CONTRAST
TECHNIQUE: Multiplanar and multiecho pulse sequences of the neck were obtained
without and with intravenous contrast. Angiographic images of the
neck were obtained using MRA technique without and with intravenous
contrast.
CONTRAST:  8mL GADAVIST GADOBUTROL 1 MMOL/ML IV SOLN

[Series 3: tof_2d_tra · axial · 3.5mm · 0.43mm/px · z∈[-100,+113]mm · 8 of 88 slices shown]
[im 1/88]
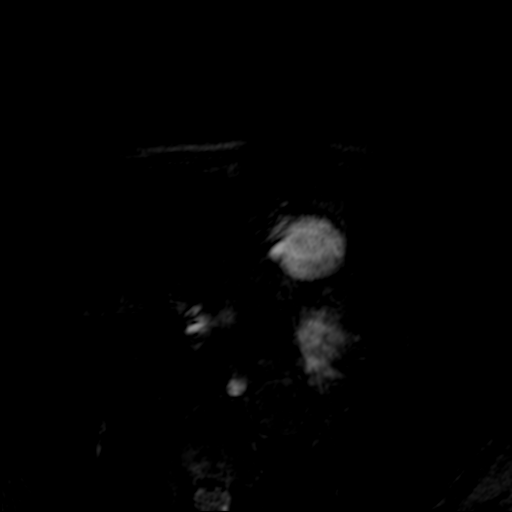
[im 10/88]
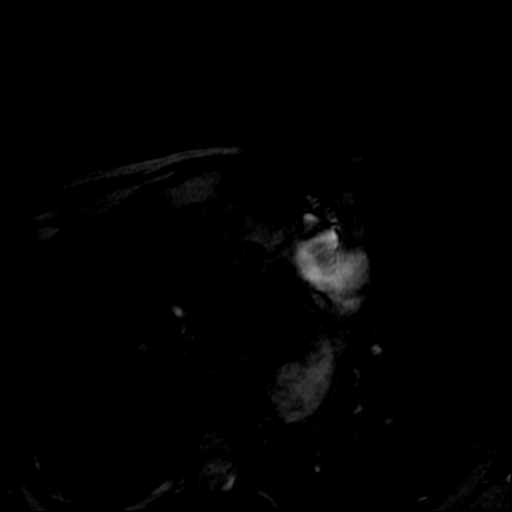
[im 30/88]
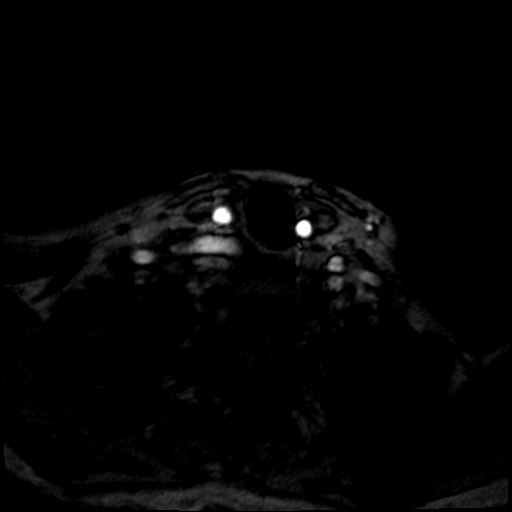
[im 39/88]
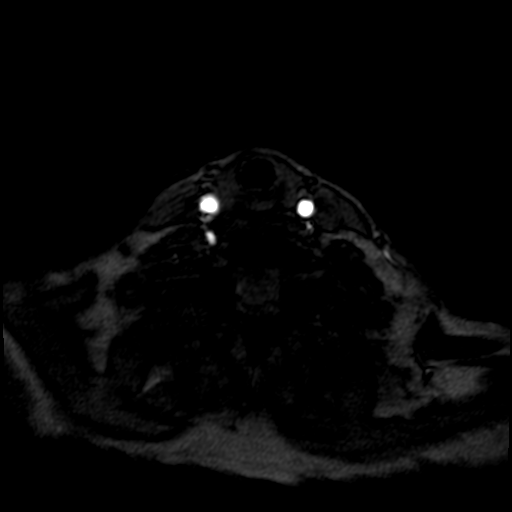
[im 49/88]
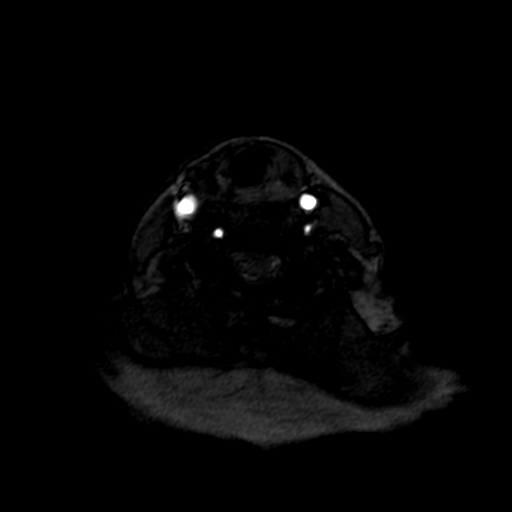
[im 59/88]
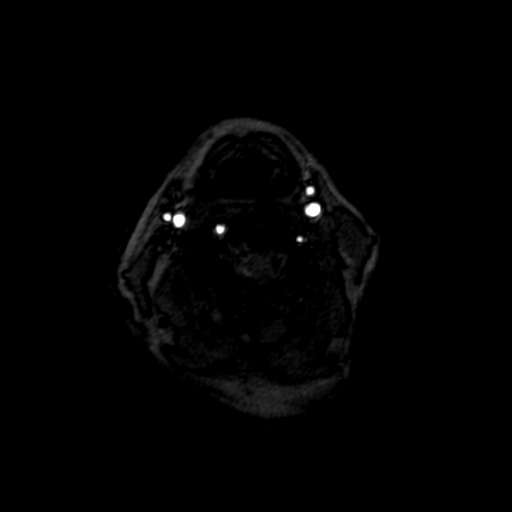
[im 78/88]
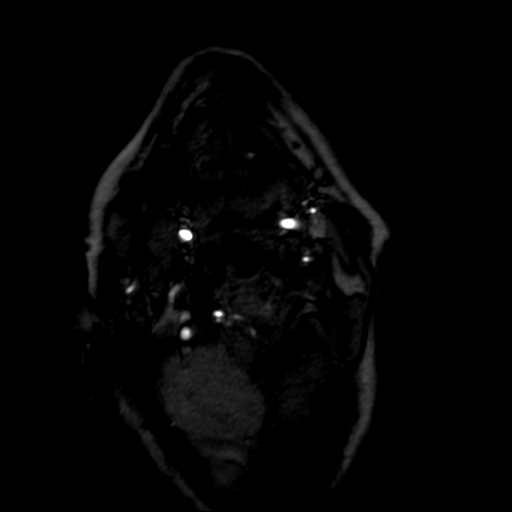
[im 88/88]
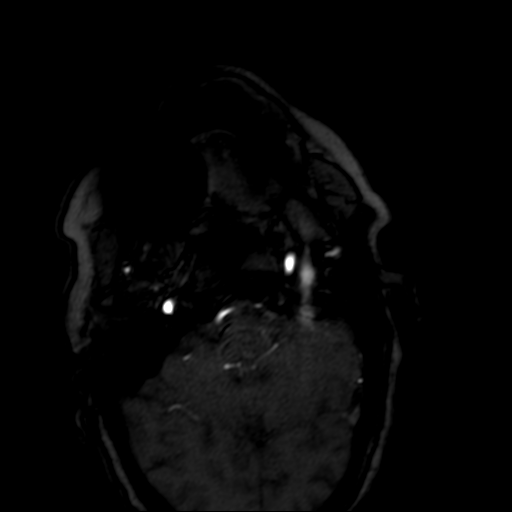

[Series 7: (id)_cor_pre · coronal · 0.8mm · 0.78mm/px · 9 of 104 slices shown]
[im 1/104]
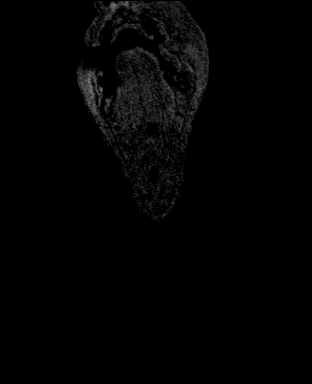
[im 18/104]
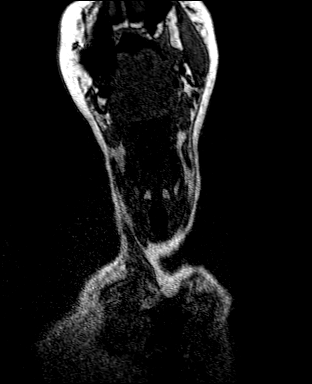
[im 35/104]
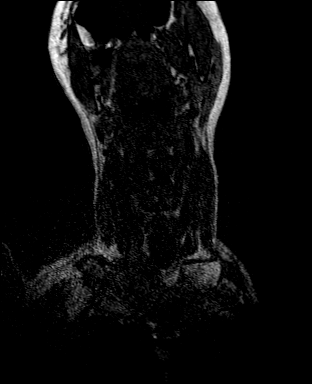
[im 43/104]
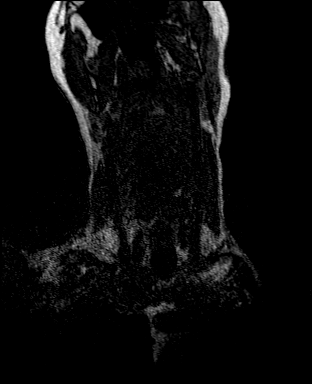
[im 52/104]
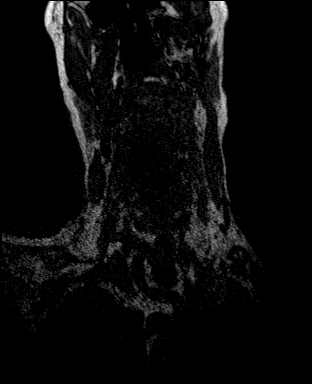
[im 61/104]
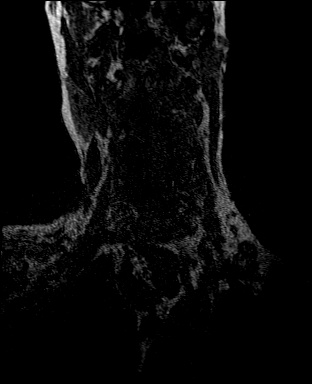
[im 69/104]
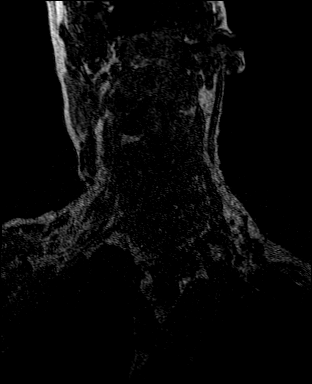
[im 86/104]
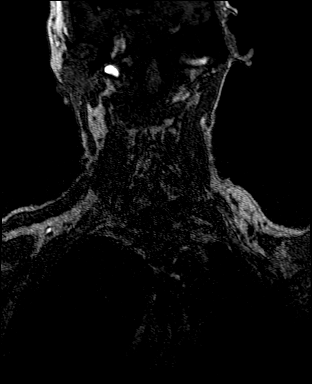
[im 104/104]
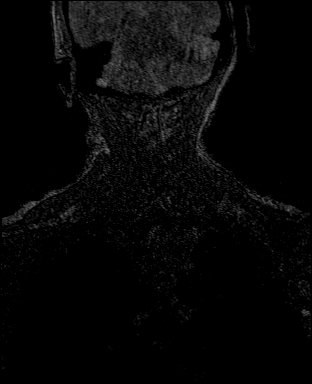

[Series 11: (id)_cor_post · coronal · 0.8mm · 0.78mm/px · 9 of 104 slices shown]
[im 1/104]
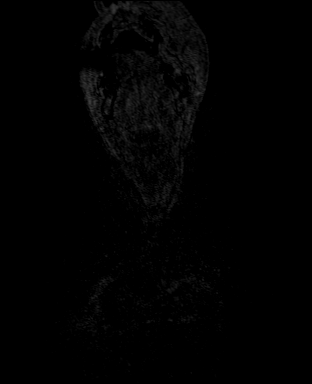
[im 18/104]
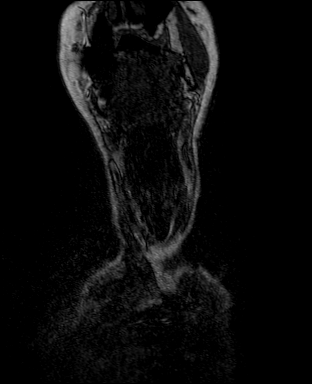
[im 35/104]
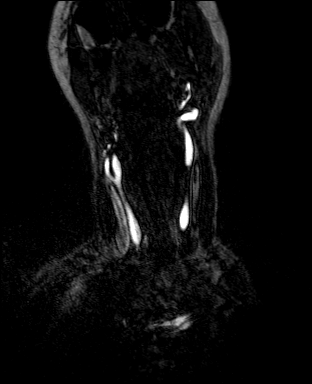
[im 43/104]
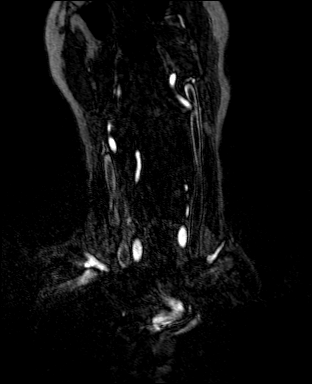
[im 52/104]
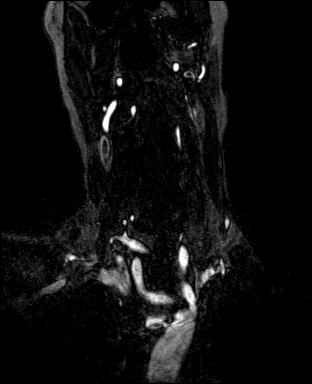
[im 61/104]
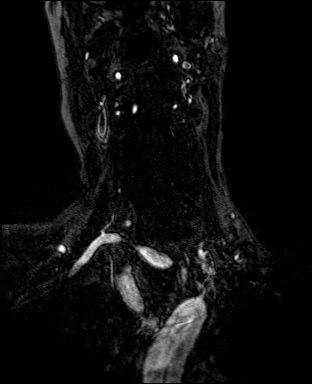
[im 69/104]
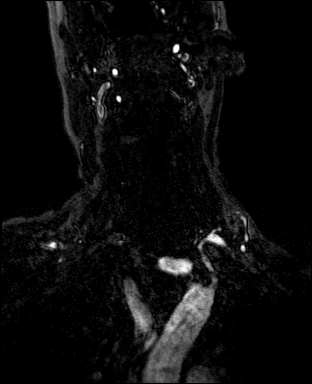
[im 86/104]
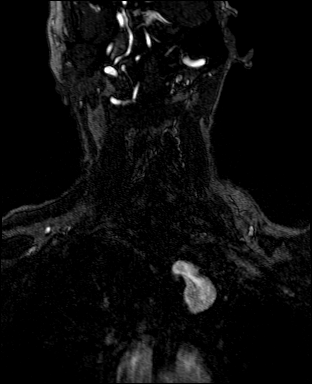
[im 104/104]
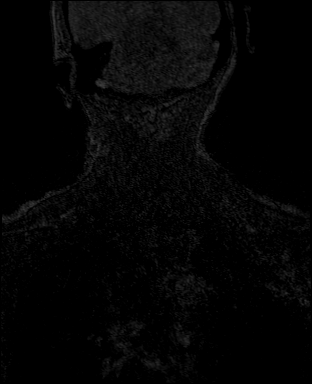

[Series 12: (id)_cor_post_venous · coronal · 0.8mm · 0.78mm/px · 5 of 103 slices shown]
[im 1/103]
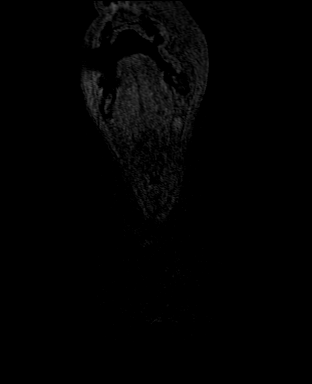
[im 19/103]
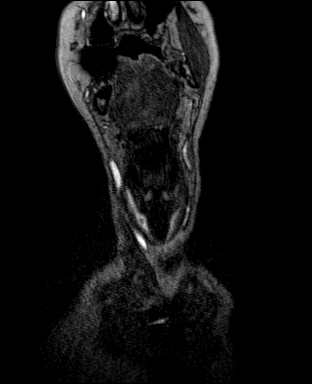
[im 28/103]
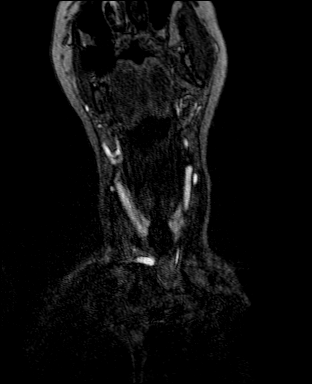
[im 56/103]
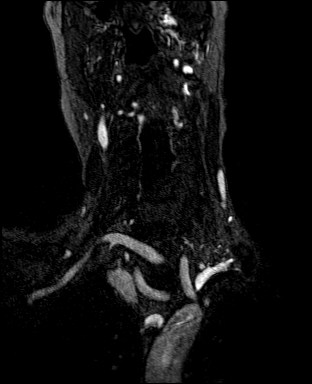
[im 93/103]
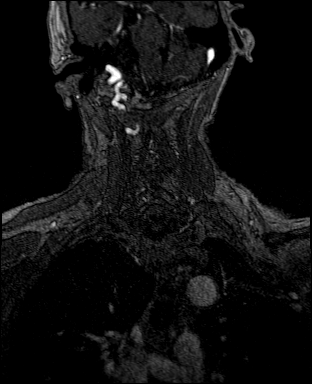

[31 of 48 positions shown; findings below may reference images not displayed]

FINDINGS: Normal aortic arch.  Proximal great vessels widely patent.

Carotid bifurcation widely patent bilaterally without significant
stenosis. No irregularity or dissection

Right vertebral artery dominant and patent to the basilar without
stenosis

Multiple areas of irregular stenosis in the mid and distal left
vertebral artery. This vessel is small but patent to the basilar.
Irregular stenosis most suggestive of atherosclerotic disease rather
than dissection.
IMPRESSION: No significant carotid stenosis

Right vertebral dominant and widely patent

Scattered areas of irregular stenosis in the mid and distal left
vertebral artery most consistent with atherosclerotic disease. There
is moderate stenosis distal left vertebral artery limiting antegrade
flow.

## 2020-08-19 IMAGING — MR MR HEAD W/ CM
3 series · 48 of 48 positions shown · IV contrast (gadavist)
Comparison: Unenhanced MRI head [DATE]

CLINICAL DATA: Stroke

EXAM:
MRI HEAD WITH CONTRAST
TECHNIQUE: Multiplanar, multiecho pulse sequences of the brain and surrounding
structures were obtained with intravenous contrast.
CONTRAST:  8mL GADAVIST GADOBUTROL 1 MMOL/ML IV SOLN

[Series 28: T1 post-contrast · oblique · 5.0mm · 0.43mm/px · 6 of 30 slices shown (1 of 3)]
[im 1/30]
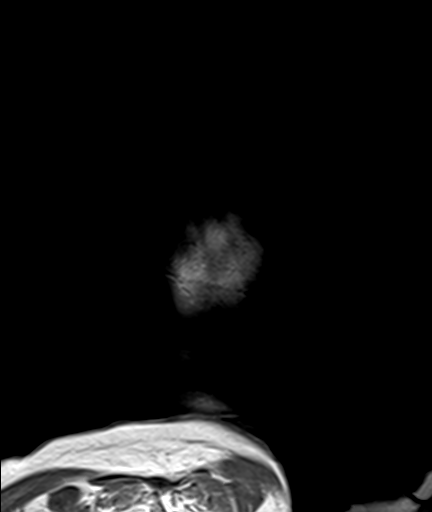
[im 6/30]
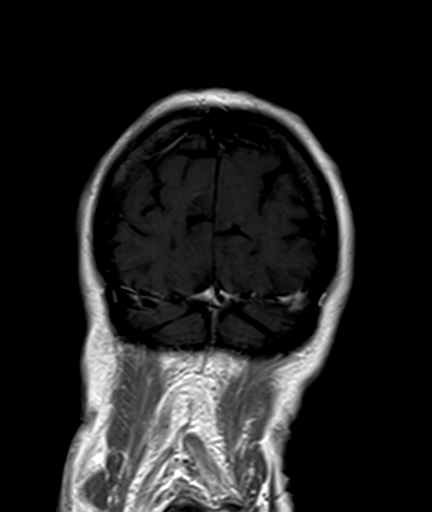
[im 12/30]
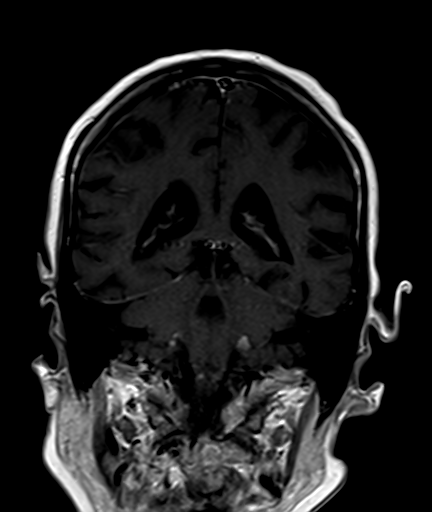
[im 18/30]
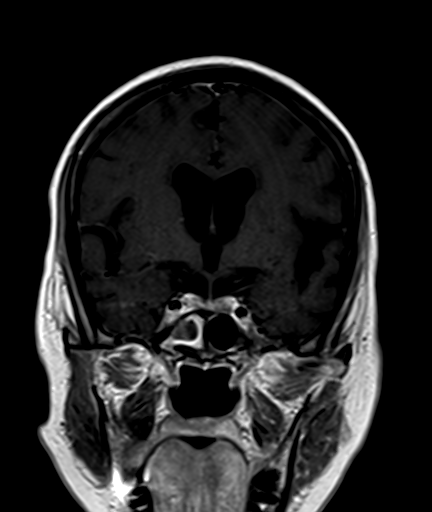
[im 24/30]
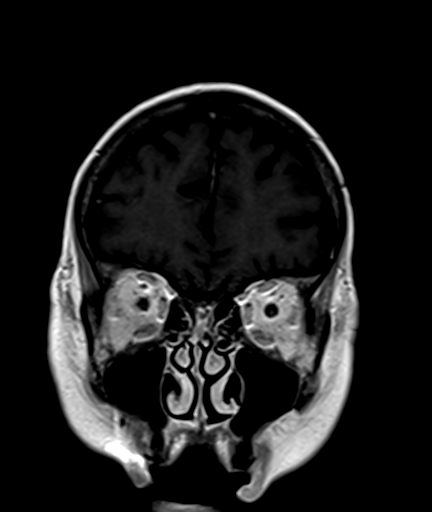
[im 30/30]
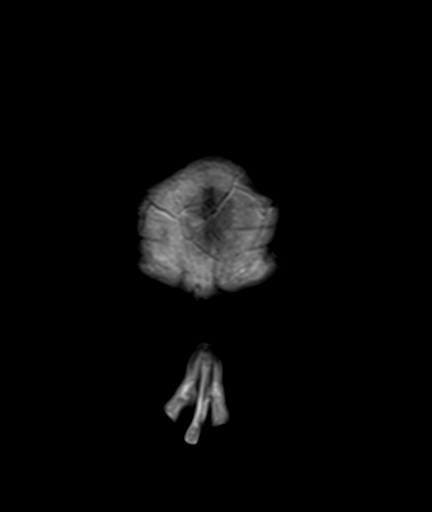

[Series 29: T1 post-contrast · oblique · 1.0mm · 0.94mm/px · 36 of 160 slices shown (2 of 3)]
[im 1/160]
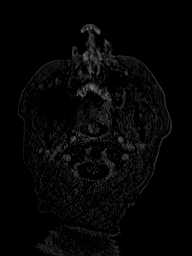
[im 5/160]
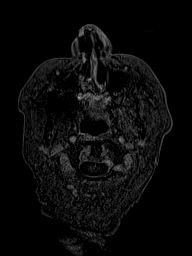
[im 10/160]
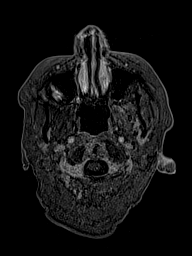
[im 14/160]
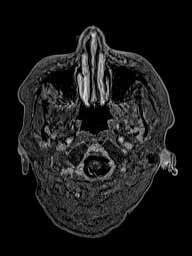
[im 19/160]
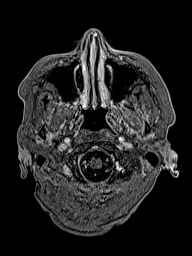
[im 23/160]
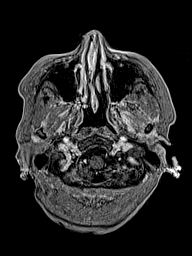
[im 28/160]
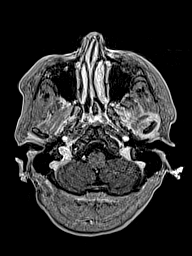
[im 32/160]
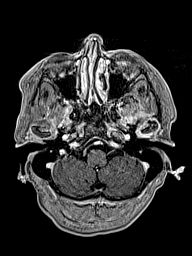
[im 37/160]
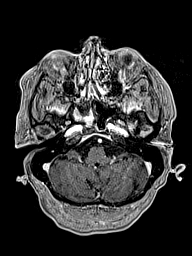
[im 41/160]
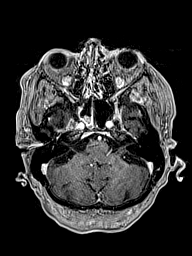
[im 46/160]
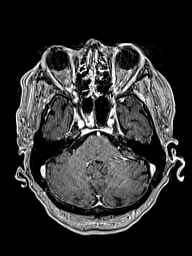
[im 50/160]
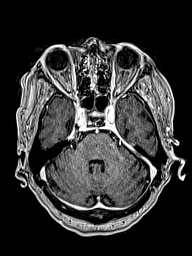
[im 55/160]
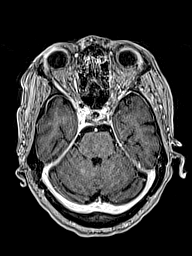
[im 60/160]
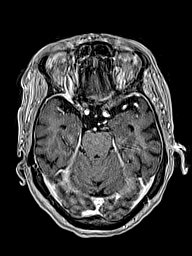
[im 64/160]
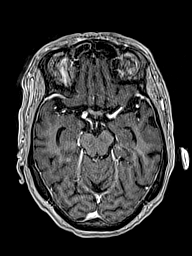
[im 69/160]
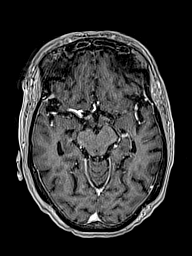
[im 73/160]
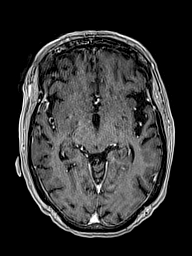
[im 78/160]
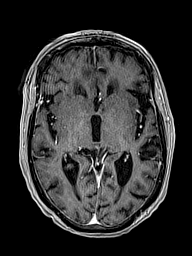
[im 82/160]
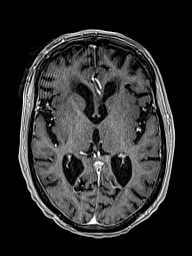
[im 87/160]
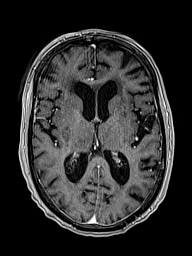
[im 91/160]
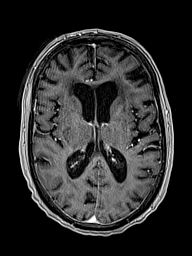
[im 96/160]
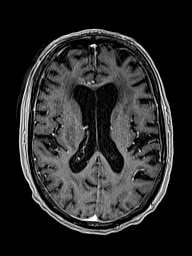
[im 100/160]
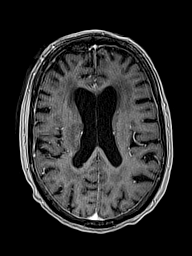
[im 105/160]
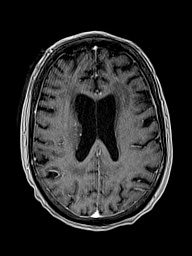
[im 110/160]
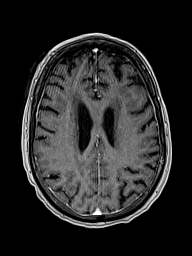
[im 114/160]
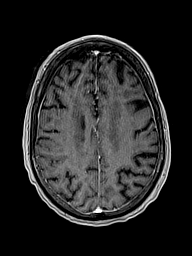
[im 119/160]
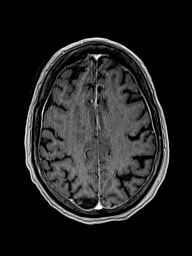
[im 123/160]
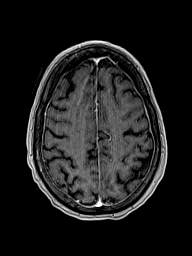
[im 128/160]
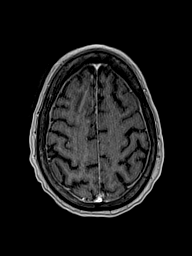
[im 132/160]
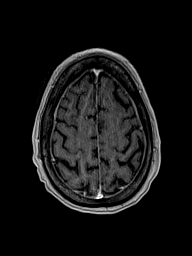
[im 137/160]
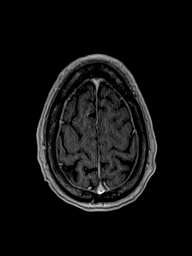
[im 141/160]
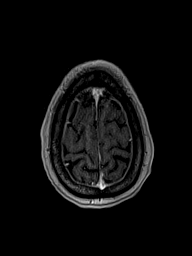
[im 146/160]
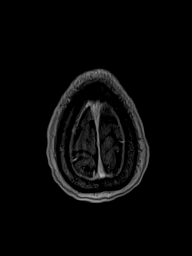
[im 150/160]
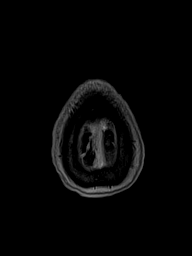
[im 155/160]
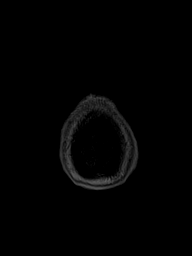
[im 160/160]
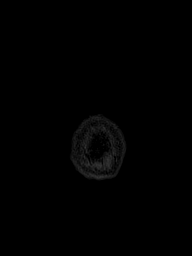

[Series 30: T1 post-contrast · sagittal · 5.0mm · 0.75mm/px · 6 of 25 slices shown (3 of 3)]
[im 1/25]
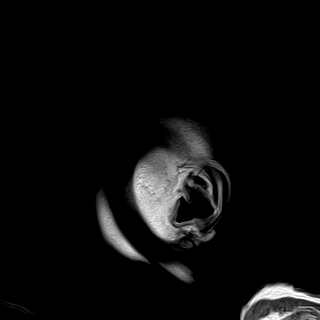
[im 5/25]
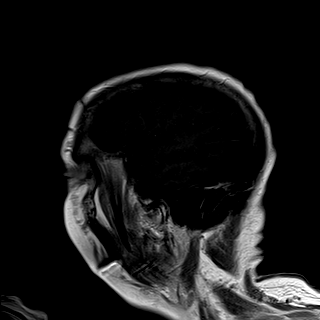
[im 10/25]
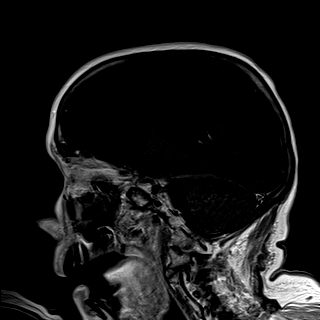
[im 15/25]
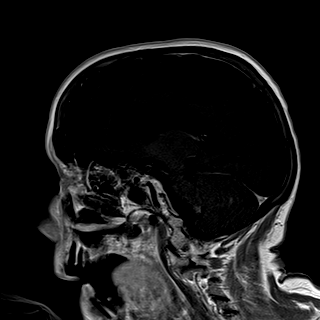
[im 20/25]
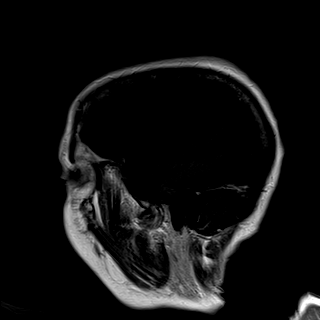
[im 25/25]
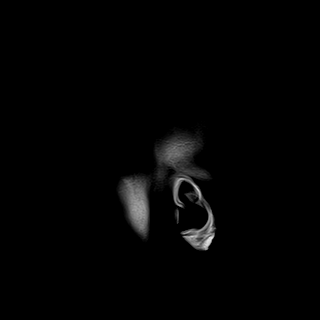

[48 of 48 positions shown; findings below may reference images not displayed]

FINDINGS: Postcontrast only images obtained. No abnormal enhancement is
identified. No mass lesion.

There is generalized atrophy with chronic microvascular ischemic
change in the white matter. Acute infarct right basal ganglia corona
radiata best seen on diffusion-weighted imaging performed yesterday.
IMPRESSION: No pathologic enhancement in the brain.

## 2020-08-19 MED ORDER — POLYETHYLENE GLYCOL 3350 17 G PO PACK
17.0000 g | PACK | Freq: Every day | ORAL | Status: DC
  Administered 2020-08-19 – 2020-08-23 (×5): 17 g via ORAL
  Filled 2020-08-19 (×6): qty 1

## 2020-08-19 MED ORDER — DIGOXIN 125 MCG PO TABS
0.1250 mg | ORAL_TABLET | ORAL | Status: DC
  Administered 2020-08-19 – 2020-08-23 (×3): 0.125 mg via ORAL
  Filled 2020-08-19 (×4): qty 1

## 2020-08-19 MED ORDER — ASPIRIN 325 MG PO TABS
325.0000 mg | ORAL_TABLET | Freq: Every day | ORAL | Status: DC
  Administered 2020-08-19 – 2020-08-20 (×2): 325 mg via ORAL
  Filled 2020-08-19 (×3): qty 1

## 2020-08-19 MED ORDER — GADOBUTROL 1 MMOL/ML IV SOLN
8.0000 mL | Freq: Once | INTRAVENOUS | Status: AC | PRN
  Administered 2020-08-19: 8 mL via INTRAVENOUS

## 2020-08-19 MED ORDER — HYDRALAZINE HCL 20 MG/ML IJ SOLN
5.0000 mg | Freq: Once | INTRAMUSCULAR | Status: AC
  Administered 2020-08-19: 5 mg via INTRAVENOUS
  Filled 2020-08-19: qty 1

## 2020-08-19 MED ORDER — STROKE: EARLY STAGES OF RECOVERY BOOK
Freq: Once | Status: DC
  Filled 2020-08-19 (×3): qty 1

## 2020-08-19 NOTE — Progress Notes (Signed)
Report called to Nira Conn, 4E RN. All questions answered at this time. All pt belongings and paper chart transferred with pt. Pt was transported in the bed on tele. 4E will continue care for pt.

## 2020-08-19 NOTE — Progress Notes (Signed)
[facility]  PROGRESS NOTE  Susan Conway CBJ:[id number] DOB: Mar 23, 1929 DOA: 08/17/2020 PCP: [name], DO   Brief HPI:   86 year old female with history of atrial fibrillation who was admitted with pelvic and sacral fracture with extraperitoneal hemorrhage after patient fell at home.  Orthopedic surgery and trauma surgery were consulted.  Patient also developed new left-sided weakness last night and was seen by tele neurology for code stroke.  CT head was negative for stroke.  MRI brain showed right basal ganglia stroke.    Subjective   Patient seen and examined, MRI brain showed right basilar stroke.  Swallow evaluation obtained yesterday and patient started on dysphagia 3 diet.  Neurologist recommended repeat MRI head and neck with contrast to rule out stenotic large vessel occlusion.   Assessment/Plan:     1. Right basal ganglia stroke-CT head was negative for CVA however MRI brain showed right basal ganglia stroke.  MRA head showed acute infarct in the right basal ganglia extending into overlying corona radiator.  Mild edema without substantial mass-effect.  Also shows apparent signal loss involving bilateral proximal right M2 MCA division, concerning for stenosis.  MRA head and neck with contrast ordered after discussion with neurology.  Patient started on aspirin 325 mg daily as per neurology recommendation.  Likely had stroke from atrial fibrillation, she is not on anticoagulation as she had ruptured Baker's cyst and she was taken off anticoagulation by cardiology. 2. Hypertension-blood pressure is elevated, started on metoprolol 2.5 mg IV every 6 hours as needed for BP greater than 180/110 for permissive hypertension. 3. Pelvic fracture-patient has sacral hematoma, orthopedic surgery recommending no surgical intervention at this time.  Patient can start working with physical therapy and gradually start bearing weight.   Patient can be started on antithrombotic agents if  needed in 5 days as per orthopedics. 4. Paroxysmal atrial fibrillation-patient has PAF with sinus node dysfunction, s/p PPM.  Patient is not on anticoagulation due to previously ruptured Baker's cyst.  Cardiology had taken her off of anticoagulation.   We will start her on digoxin for tachycardia as now she is on dysphagia 3 diet. 5. Thrombocytopenia-platelet count is 81,000, unclear etiology.  Follow peripheral smear review.     COVID-19 Labs  No results for input(s): DDIMER, FERRITIN, LDH, CRP in the last 72 hours.  Lab Results  Component Value Date   SARSCOV2NAA NEGATIVE 08/17/2020   [place] NEGATIVE 05/23/2019     Scheduled medications:   . acetaminophen  1,000 mg Oral Q6H  . aspirin  325 mg Oral Daily  . calcium-vitamin D  1 tablet Oral Daily  . Chlorhexidine Gluconate Cloth  6 each Topical Daily  . lidocaine  1 patch Transdermal Q24H  . LORazepam  1 mg Oral QHS  . mupirocin ointment  1 application Nasal BID         CBG: Recent Labs  Lab 08/17/20 2249  GLUCAP 149*    SpO2: 99 % O2 Flow Rate (L/min): 1 L/min    CBC: Recent Labs  Lab 08/17/20 0731 08/17/20 1322 08/18/20 0255  WBC 15.5* 12.7* 15.9*  NEUTROABS 11.9* 11.1*  --   HGB 11.2* 9.7* 9.1*  HCT 35.0* 31.3* 29.2*  MCV 92.3 93.4 94.5  PLT 84* 81* 81*    Basic Metabolic Panel: Recent Labs  Lab 08/17/20 0731 08/18/20 0255  NA 142 137  K 3.7 3.9  CL 105 106  CO2 24 23  GLUCOSE 112* 134*  BUN 24* 21  CREATININE 0.89  1.01*  CALCIUM 9.3 8.7*     Liver Function Tests: Recent Labs  Lab 08/17/20 0731 08/18/20 0255  AST 32 26  ALT 24 21  ALKPHOS 62 53  BILITOT 1.2 1.2  PROT 6.5 6.1*  ALBUMIN 3.7 3.4*     Antibiotics: Anti-infectives (From admission, onward)   None       DVT prophylaxis: SCDs  Code Status: Full code  Family Communication: Discussed with patient's daughter on phone    Status is: Inpatient  Dispo: The patient is from: Home              Anticipated  d/c is to: Home              Anticipated d/c date is: 08/20/2020              Patient currently not medically stable for discharge  Barrier to discharge-ongoing work-up for stroke, pelvic and sacral fracture with extraperitoneal hematoma  Pressure Injury 08/18/20 Knee Left;Posterior Stage 1 -  Intact skin with non-blanchable redness of a localized area usually over a bony prominence. (Active)  08/18/20 0200  Location: Knee  Location Orientation: Left;Posterior  Staging: Stage 1 -  Intact skin with non-blanchable redness of a localized area usually over a bony prominence.  Wound Description (Comments):   Present on Admission: Yes         Consultants:  Orthopedics  Trauma surgery  Procedures:     Objective   Vitals:   08/19/20 0445 08/19/20 0527 08/19/20 0700 08/19/20 0800  BP: (!) 197/104 (!) 143/57  (!) 154/99  Pulse: (!) 105 (!) 132 (!) 104 (!) 116  Resp: 15 (!) 28 19 (!) 25  Temp:      TempSrc:      SpO2: 97% 99% 98% 99%  Weight:      Height:        Intake/Output Summary (Last 24 hours) at 08/19/2020 0826 Last data filed at 08/19/2020 0600 Gross per 24 hour  Intake 1158.07 ml  Output 670 ml  Net 488.07 ml    10/12 1901 - 10/14 0700 In: 1656.7 [P.O.:120; I.V.:1337.9] Out: 1020 [Urine:1020]  Filed Weights   08/17/20 0529 08/17/20 1927 08/18/20 0230  Weight: 61.2 kg 61.4 kg 63 kg    Physical Examination:  General-appears in no acute distress Heart-S1-S2, regular, no murmur auscultated Lungs-clear to auscultation bilaterally, no wheezing or crackles auscultated Abdomen-soft, nontender, no organomegaly Extremities-no edema in the lower extremities Neuro-alert, oriented x3, left hemiparesis   Data Reviewed:   Recent Results (from the past 240 hour(s))  Respiratory Panel by RT PCR (Flu A&B, Covid) - Nasopharyngeal Swab     Status: None   Collection Time: 08/17/20  7:07 AM   Specimen: Nasopharyngeal Swab  Result Value Ref Range Status   SARS  Coronavirus 2 by RT PCR NEGATIVE NEGATIVE Final    Comment: (NOTE) SARS-CoV-2 target nucleic acids are NOT DETECTED.  The SARS-CoV-2 RNA is generally detectable in upper respiratoy specimens during the acute phase of infection. The lowest concentration of SARS-CoV-2 viral copies this assay can detect is 131 copies/mL. A negative result does not preclude SARS-Cov-2 infection and should not be used as the sole basis for treatment or other patient management decisions. A negative result may occur with  improper specimen collection/handling, submission of specimen other than nasopharyngeal swab, presence of viral mutation(s) within the areas targeted by this assay, and inadequate number of viral copies (<131 copies/mL). A negative result must be combined with clinical observations,  patient history, and epidemiological information. The expected result is Negative.  Fact Sheet for Patients:  PinkCheek.be  Fact Sheet for Healthcare Providers:  GravelBags.it  This test is no t yet approved or cleared by the [place] FDA and  has been authorized for detection and/or diagnosis of SARS-CoV-2 by FDA under an Emergency Use Authorization (EUA). This EUA will remain  in effect (meaning this test can be used) for the duration of the COVID-19 declaration under Section 564(b)(1) of the Act, 21 U.S.C. section 360bbb-3(b)(1), unless the authorization is terminated or revoked sooner.     Influenza A by PCR NEGATIVE NEGATIVE Final   Influenza B by PCR NEGATIVE NEGATIVE Final    Comment: (NOTE) The Xpert Xpress SARS-CoV-2/FLU/RSV assay is intended as an aid in  the diagnosis of influenza from Nasopharyngeal swab specimens and  should not be used as a sole basis for treatment. Nasal washings and  aspirates are unacceptable for Xpert Xpress SARS-CoV-2/FLU/RSV  testing.  Fact Sheet for  Patients: PinkCheek.be  Fact Sheet for Healthcare Providers: GravelBags.it  This test is not yet approved or cleared by the [place] FDA and  has been authorized for detection and/or diagnosis of SARS-CoV-2 by  FDA under an Emergency Use Authorization (EUA). This EUA will remain  in effect (meaning this test can be used) for the duration of the  Covid-19 declaration under Section 564(b)(1) of the Act, 21  U.S.C. section 360bbb-3(b)(1), unless the authorization is  terminated or revoked. Performed at [facility], [place] [address] [place], [place] [address]   Surgical PCR screen     Status: Abnormal   Collection Time: 08/18/20 12:48 AM   Specimen: Nasal Mucosa; Nasal Swab  Result Value Ref Range Status   MRSA, PCR NEGATIVE NEGATIVE Final   Staphylococcus aureus POSITIVE (A) NEGATIVE Final    Comment: (NOTE) The Xpert SA Assay (FDA approved for NASAL specimens in patients 32 years of age and older), is one component of a comprehensive surveillance program. It is not intended to diagnose infection nor to guide or monitor treatment. Performed at [facility], [place] [address] [place], [place] [address]     No results for input(s): LIPASE, AMYLASE in the last 168 hours. No results for input(s): AMMONIA in the last 168 hours.  Cardiac Enzymes: No results for input(s): CKTOTAL, CKMB, CKMBINDEX, TROPONINI in the last 168 hours. BNP (last 3 results) No results for input(s): BNP in the last 8760 hours.  ProBNP (last 3 results) No results for input(s): PROBNP in the last 8760 hours.  Studies:  DG Elbow 2 Views Left  Result Date: 08/17/2020 CLINICAL DATA:  Left elbow pain since a fall this morning. Initial encounter. EXAM: LEFT ELBOW - 2 VIEW COMPARISON:  None. FINDINGS: There is no evidence of fracture, dislocation, or joint effusion. There is no evidence of arthropathy or other focal  bone abnormality. Soft tissues are unremarkable. IMPRESSION: Negative exam. Electronically Signed   By: Inge Rise M.D.   On: 08/17/2020 15:28   DG Wrist 2 Views Left  Result Date: 08/17/2020 CLINICAL DATA:  Left wrist pain after a fall this morning. EXAM: LEFT WRIST - 2 VIEW COMPARISON:  None. FINDINGS: There is no evidence of fracture or dislocation. There is no evidence of arthropathy or other focal bone abnormality. Ulnar minus variance noted. Soft tissues are unremarkable. IMPRESSION: No acute abnormality. Electronically Signed   By: Inge Rise M.D.   On: 08/17/2020 15:29   MR ANGIO HEAD  WO CONTRAST  Addendum Date: 08/18/2020   ADDENDUM REPORT: 08/18/2020 15:40 ADDENDUM: Findings discussed with Dr. Darrick Meigs at 3:38 PM. Electronically Signed   By: Margaretha Sheffield MD   On: 08/18/2020 15:40   Result Date: 08/18/2020 CLINICAL DATA:  Atrial fibrillation.  TIA versus stroke. EXAM: MRI HEAD WITHOUT CONTRAST MRA HEAD WITHOUT CONTRAST TECHNIQUE: Multiplanar, multiecho pulse sequences of the brain and surrounding structures were obtained without intravenous contrast. Angiographic images of the head were obtained using MRA technique without contrast. COMPARISON:  Same day CT head FINDINGS: MRI HEAD FINDINGS Brain: There is an acute infarct within the posterior limb of the internal capsule, which involves a portion of the posterior putamen and extends into the overlying right corona radiata. Remote lacunar infarcts involving bilateral cerebellar hemispheres and possibly the left frontal white matter. Mild edema without substantial mass effect. Additional scattered T2/FLAIR hyperintensities throughout the white matter is compatible with chronic microvascular ischemic disease. Mild to moderate diffuse cerebral volume loss with ex vacuo ventricular dilation. No acute hemorrhage. No hydrocephalus. Skull and upper cervical spine: Normal marrow signal. Sinuses/Orbits: Mild scattered paranasal sinus mucosal  thickening. No acute orbital abnormality. Other: No mastoid effusions. MRA HEAD FINDINGS Vascular evaluation is limited by patient motion. Within this limitation: Anterior circulation: There is apparent signal loss involving bilateral proximal right M2 MCA divisions, concerning for stenosis but poorly evaluated on this motion limited exam. Late branching of the left MCA bifurcation. No evidence of significant stenosis proximally involving the left MCA or both ACAs. no evidence of aneurysm on this motionless limited exam. Posterior circulation: Diminutive left V4 vertebral artery, likely secondary to non dominant vertebral artery. Bilateral fetal type PCAs. Likely moderate stenosis of the right proximal P2 PCA. No significant stenosis of the left PCA. No evidence of aneurysm on this motion limited exam. IMPRESSION: 1. Acute infarct in the right basal ganglia, extending into the overlying corona radiata. Mild edema without substantial mass effect. 2. There is apparent signal loss involving bilateral proximal right M2 MCA divisions, concerning for stenosis but poorly evaluated on this motion limited exam. A CTA could further evaluate and quantify the degree of stenosis if clinically indicated. 3. Bilateral fetal type PCAs with likely moderate stenosis of the right proximal P2 PCA. 4. Diminutive left V4 vertebral artery, likely secondary to non dominant vertebral artery. Dedicated neck CTA or MRA imaging could further evaluate if clinically indicated. 5. Chronic microvascular ischemic disease and remote lacunar infarcts, as detailed above. Electronically Signed: By: Margaretha Sheffield MD On: 08/18/2020 14:54   MR BRAIN WO CONTRAST  Addendum Date: 08/18/2020   ADDENDUM REPORT: 08/18/2020 15:40 ADDENDUM: Findings discussed with Dr. Darrick Meigs at 3:38 PM. Electronically Signed   By: Margaretha Sheffield MD   On: 08/18/2020 15:40   Result Date: 08/18/2020 CLINICAL DATA:  Atrial fibrillation.  TIA versus stroke. EXAM: MRI HEAD  WITHOUT CONTRAST MRA HEAD WITHOUT CONTRAST TECHNIQUE: Multiplanar, multiecho pulse sequences of the brain and surrounding structures were obtained without intravenous contrast. Angiographic images of the head were obtained using MRA technique without contrast. COMPARISON:  Same day CT head FINDINGS: MRI HEAD FINDINGS Brain: There is an acute infarct within the posterior limb of the internal capsule, which involves a portion of the posterior putamen and extends into the overlying right corona radiata. Remote lacunar infarcts involving bilateral cerebellar hemispheres and possibly the left frontal white matter. Mild edema without substantial mass effect. Additional scattered T2/FLAIR hyperintensities throughout the white matter is compatible with chronic microvascular ischemic disease. Mild  to moderate diffuse cerebral volume loss with ex vacuo ventricular dilation. No acute hemorrhage. No hydrocephalus. Skull and upper cervical spine: Normal marrow signal. Sinuses/Orbits: Mild scattered paranasal sinus mucosal thickening. No acute orbital abnormality. Other: No mastoid effusions. MRA HEAD FINDINGS Vascular evaluation is limited by patient motion. Within this limitation: Anterior circulation: There is apparent signal loss involving bilateral proximal right M2 MCA divisions, concerning for stenosis but poorly evaluated on this motion limited exam. Late branching of the left MCA bifurcation. No evidence of significant stenosis proximally involving the left MCA or both ACAs. no evidence of aneurysm on this motionless limited exam. Posterior circulation: Diminutive left V4 vertebral artery, likely secondary to non dominant vertebral artery. Bilateral fetal type PCAs. Likely moderate stenosis of the right proximal P2 PCA. No significant stenosis of the left PCA. No evidence of aneurysm on this motion limited exam. IMPRESSION: 1. Acute infarct in the right basal ganglia, extending into the overlying corona radiata. Mild  edema without substantial mass effect. 2. There is apparent signal loss involving bilateral proximal right M2 MCA divisions, concerning for stenosis but poorly evaluated on this motion limited exam. A CTA could further evaluate and quantify the degree of stenosis if clinically indicated. 3. Bilateral fetal type PCAs with likely moderate stenosis of the right proximal P2 PCA. 4. Diminutive left V4 vertebral artery, likely secondary to non dominant vertebral artery. Dedicated neck CTA or MRA imaging could further evaluate if clinically indicated. 5. Chronic microvascular ischemic disease and remote lacunar infarcts, as detailed above. Electronically Signed: By: Margaretha Sheffield MD On: 08/18/2020 14:54   CT ABDOMEN PELVIS W CONTRAST  Result Date: 08/17/2020 CLINICAL DATA:  Pelvic fractures with hemorrhage EXAM: CT ABDOMEN AND PELVIS WITH CONTRAST TECHNIQUE: Multidetector CT imaging of the abdomen and pelvis was performed using the standard protocol following bolus administration of intravenous contrast. CONTRAST:  12mL OMNIPAQUE IOHEXOL 300 MG/ML  SOLN COMPARISON:  Pelvis CT earlier same day FINDINGS: Lower chest: Mild scarring at the lung bases. Partially imaged cardiomegaly. Hepatobiliary: Small hepatic cysts and other too small to characterize hypodense lesions. No hepatic injury or perihepatic hematoma. Gallbladder is unremarkable. Prominence of the proximal common bile duct with tapering. Pancreas: Unremarkable apart from some atrophy. Spleen: Too small to characterize hypodense lesion. No splenic injury or perisplenic hematoma. Adrenals/Urinary Tract: Bladder is displaced superiorly into the right by hemorrhage. Integrity of the bladder is not evaluated on this study. Small right renal cysts. Adrenals are unremarkable. Stomach/Bowel: Stomach is within normal limits. Bowel is normal in caliber. Vascular/Lymphatic: Diffuse area iliac atherosclerosis. No enlarged lymph nodes identified. Reproductive: Uterus  and bilateral adnexa are unremarkable. Musculoskeletal: Left pelvic and sacral fractures as previously described. Associated extraperitoneal hemorrhage along the ventral abdominal wall and left pelvic sidewall extending into the left paracolic gutter. No substantial change. Chronic appearing severe L1 compression fracture. IMPRESSION: Pelvic and sacral fractures as described previously. Associated extraperitoneal hemorrhage is not substantially changed. No evidence of acute traumatic injury in the abdomen. Electronically Signed   By: Macy Mis M.D.   On: 08/17/2020 13:09   DG CHEST PORT 1 VIEW  Result Date: 08/17/2020 CLINICAL DATA:  Left hip and elbow pain post fall EXAM: PORTABLE CHEST 1 VIEW COMPARISON:  Radiograph 05/26/2019 FINDINGS: Low lung volumes with some atelectatic changes. Chronic biapical pleuroparenchymal scarring and coarsened reticular changes are similar to comparison exams. Postsurgical changes related to prior CABG including intact and aligned sternotomy wires and multiple surgical clips projecting over the mediastinum. Prominent cardiac silhouette likely  accentuated by the portable technique with a calcified aorta. Pacer pack overlies the left chest wall with single lead at the cardiac apex similar to prior. No acute osseous or soft tissue abnormality. Degenerative changes are present in the imaged spine and shoulders. Features of right shoulder calcific tendinosis/hydroxyapatite deposition. IMPRESSION: No acute cardiopulmonary abnormality. No acute traumatic findings in the chest. Aortic Atherosclerosis (ICD10-I70.0). Prior sternotomy and CABG. Well seated pacer lead in stable position. Electronically Signed   By: Lovena Le M.D.   On: 08/17/2020 15:03   CT HEAD CODE STROKE WO CONTRAST  Result Date: 08/17/2020 CLINICAL DATA:  Code stroke.  Left-sided facial droop EXAM: CT HEAD WITHOUT CONTRAST TECHNIQUE: Contiguous axial images were obtained from the base of the skull through  the vertex without intravenous contrast. COMPARISON:  None. FINDINGS: Brain: There is no mass, hemorrhage or extra-axial collection. There is generalized atrophy without lobar predilection. There is hypoattenuation of the periventricular white matter, most commonly indicating chronic ischemic microangiopathy. Vascular: No abnormal hyperdensity of the major intracranial arteries or dural venous sinuses. No intracranial atherosclerosis. Skull: The visualized skull base, calvarium and extracranial soft tissues are normal. Sinuses/Orbits: No fluid levels or advanced mucosal thickening of the visualized paranasal sinuses. No mastoid or middle ear effusion. The orbits are normal. ASPECTS [facility] Stroke Program Early CT Score) - Ganglionic level infarction (caudate, lentiform nuclei, internal capsule, insula, M1-M3 cortex): 7 - Supraganglionic infarction (M4-M6 cortex): 3 Total score (0-10 with 10 being normal): 10 IMPRESSION: 1. No acute intracranial abnormality. 2. ASPECTS is 10. 3. Severe chronic ischemic changes of the white matter. These results were called by telephone at the time of interpretation on 08/17/2020 at 11:36 pm to provider Lang Snow , who verbally acknowledged these results. Electronically Signed   By: Ulyses Jarred M.D.   On: 08/17/2020 23:37   VAS US CAROTID  Result Date: 08/18/2020 Carotid Arterial Duplex Study Indications:       Stroke, new left-sided weakness. Comparison Study:  No prior studies. Performing Technologist: Darlin Coco  Examination Guidelines: A complete evaluation includes B-mode imaging, spectral Doppler, color Doppler, and power Doppler as needed of all accessible portions of each vessel. Bilateral testing is considered an integral part of a complete examination. Limited examinations for reoccurring indications may be performed as noted.  Right Carotid Findings: +----------+--------+--------+--------+------------------+------------------+           PSV cm/sEDV  cm/sStenosisPlaque DescriptionComments           +----------+--------+--------+--------+------------------+------------------+ CCA Prox  53      11                                intimal thickening +----------+--------+--------+--------+------------------+------------------+ CCA Distal56      12              heterogenous                         +----------+--------+--------+--------+------------------+------------------+ ICA Prox  49      17      1-39%   heterogenous                         +----------+--------+--------+--------+------------------+------------------+ ICA Distal85      27                                                   +----------+--------+--------+--------+------------------+------------------+  ECA       64                                                           +----------+--------+--------+--------+------------------+------------------+ +----------+--------+-------+----------------+-------------------+           PSV cm/sEDV cmsDescribe        Arm Pressure (mmHG) +----------+--------+-------+----------------+-------------------+ [id number]             Multiphasic, WNL                    +----------+--------+-------+----------------+-------------------+ +---------+--------+--+--------+--+---------+ VertebralPSV cm/s60EDV cm/s26Antegrade +---------+--------+--+--------+--+---------+  Left Carotid Findings: +----------+--------+--------+--------+------------------+------------------+           PSV cm/sEDV cm/sStenosisPlaque DescriptionComments           +----------+--------+--------+--------+------------------+------------------+ CCA Prox  77      17                                intimal thickening +----------+--------+--------+--------+------------------+------------------+ CCA Distal85      23              calcific                              +----------+--------+--------+--------+------------------+------------------+ ICA Prox  74      21      1-39%   heterogenous                         +----------+--------+--------+--------+------------------+------------------+ ICA Distal92      27                                tortuous           +----------+--------+--------+--------+------------------+------------------+ ECA       82                                                           +----------+--------+--------+--------+------------------+------------------+ +----------+--------+--------+----------------+-------------------+           PSV cm/sEDV cm/sDescribe        Arm Pressure (mmHG) +----------+--------+--------+----------------+-------------------+ [id number]             Multiphasic, WNL                    +----------+--------+--------+----------------+-------------------+ +---------+--------+--+--------+--+---------+ VertebralPSV cm/s75EDV cm/s12Antegrade +---------+--------+--+--------+--+---------+   Summary: Right Carotid: Velocities in the right ICA are consistent with a 1-39% stenosis. Left Carotid: Velocities in the left ICA are consistent with a 1-39% stenosis. Vertebrals:  Bilateral vertebral arteries demonstrate antegrade flow. Subclavians: Normal flow hemodynamics were seen in bilateral subclavian              arteries. *See table(s) above for measurements and observations.  Electronically signed by Antony Contras MD on 08/18/2020 at 10:49:24 AM.    Final        [facility] If 7PM-7AM, please contact night-coverage at [email or web address], Office  [phone]   08/19/2020, 8:26 AM  LOS: 2  days

## 2020-08-19 NOTE — TOC Progression Note (Signed)
Transition of Care [facility]) - Progression Note    Patient Details  Name: Susan Conway MRN: [id number] Date of Birth: 1929/10/29  [facility] CM/SW Contact  Leeroy Cha, RN Phone Number: 08/19/2020, 8:40 AM  Clinical Narrative:    Pt will njeed snf placment due to pelvic fracture and possible left sided cva. fl2 sent to [facility] for consideration.   Expected Discharge Plan: [place] Barriers to Discharge: Barriers Unresolved (comment)  Expected Discharge Plan and Services Expected Discharge Plan: [place]   Discharge Planning Services: CM Consult   Living arrangements for the past 2 months: [place]                                       Social Determinants of Health (SDOH) Interventions    Readmission Risk Interventions No flowsheet data found.

## 2020-08-19 NOTE — NC FL2 (Signed)
[place] LEVEL OF CARE SCREENING TOOL     IDENTIFICATION  Patient Name: Susan Conway Birthdate: 1928/12/11 Sex: female Admission Date (Current Location): 08/17/2020  [facility] and [place] Number:  Herbalist and Address:  [facility],  [place] [address], [place]      Provider Number: [id number]  Attending Physician Name and Address:  Oswald Hillock, MD  Relative Name and Phone Number:       Current Level of Care: Hospital Recommended Level of Care: [place] Prior Approval Number:    Date Approved/Denied:   PASRR Number: [id number]  Discharge Plan: SNF    Current Diagnoses: Patient Active Problem List   Diagnosis Date Noted  . Pressure injury of skin 08/19/2020  . Sacral fracture, closed ([place])   . Pelvic fracture ([place]) 08/17/2020  . Hemorrhage 03/04/2020  . Asymptomatic varicose veins of both lower extremities 01/29/2020  . Onychomycosis of left great toe 01/29/2020  . Xerosis cutis 01/29/2020  . Ventricular tachycardia ([place]) 10/22/2019  . Presence of cardiac pacemaker 10/22/2019  . Atrioventricular block, complete ([place]) 05/26/2019  . Gastroesophageal reflux disease without esophagitis 10/02/2018  . Primary osteoarthritis involving multiple joints 10/02/2018  . Hypercoagulable state due to atrial fibrillation ([place]) 10/02/2018  . Trigger middle finger of right hand 10/26/2017  . Ganglion, left wrist 11/12/2015  . Lower GI bleed   . Internal bleeding hemorrhoids   . Rectal ulcer   . Constipation   . Rectal bleeding 10/17/2015  . Actinic keratosis 05/25/2014  . Basal cell carcinoma 05/25/2014  . Leg cramp 10/27/2013  . Tennis elbow 10/27/2013  . Pain in joint, ankle and foot 10/27/2013  . Pain in joint, shoulder region 06/21/2013  . Cervicalgia 06/21/2013  . Hyperglycemia 04/28/2013  . Hemorrhoids 09/16/2012  . Coronary artery disease involving native coronary artery of native heart without angina  pectoris 09/12/2010  . Atrial fibrillation ([place]) 09/07/2010  . Long term (current) use of anticoagulants 09/06/2010  . Hyperlipidemia 03/17/2010  . OLD MYOCARDIAL INFARCTION 03/16/2010  . PREMATURE VENTRICULAR CONTRACTIONS 03/16/2010  . Osteoporosis 05/13/2009  . Meralgia paresthetica 07/08/2007  . Essential hypertension 07/17/1997    Orientation RESPIRATION BLADDER Height & Weight     Self  Normal Continent Weight: 63 kg Height:  5\' 6"  (167.6 cm)  BEHAVIORAL SYMPTOMS/MOOD NEUROLOGICAL BOWEL NUTRITION STATUS      Continent Diet (regular)  AMBULATORY STATUS COMMUNICATION OF NEEDS Skin   Extensive Assist Verbally Normal                       Personal Care Assistance Level of Assistance  Bathing, Feeding, Dressing Bathing Assistance: Limited assistance Feeding assistance: Limited assistance Dressing Assistance: Limited assistance     Functional Limitations Info  Sight, Speech, Hearing Sight Info: Adequate Hearing Info: Adequate Speech Info: Adequate    SPECIAL CARE FACTORS FREQUENCY  PT (By licensed PT), OT (By licensed OT)     PT Frequency: 5-7xweekly OT Frequency: 5-7 x weekly            Contractures Contractures Info: Not present    Additional Factors Info  Code Status Code Status Info: DNR             Current Medications (08/19/2020):  This is the current hospital active medication list Current Facility-Administered Medications  Medication Dose Route Frequency Provider Last Rate Last Admin  . 0.9 %  sodium chloride infusion   Intravenous Continuous Oswald Hillock, MD  Stopped at 08/19/20 0057  . acetaminophen (TYLENOL) tablet 1,000 mg  1,000 mg Oral Q6H Jill Alexanders, PA-C   1,000 mg at 08/19/20 9407  . aspirin tablet 325 mg  325 mg Oral Daily [place], Marge Duncans, MD      . calcium-vitamin D (OSCAL WITH D) 500-200 MG-UNIT per tablet 1 tablet  1 tablet Oral Daily Kyle, Tyrone A, DO   1 tablet at 08/17/20 2023  . Chlorhexidine Gluconate Cloth 2 %  PADS 6 each  6 each Topical Daily Marybelle Killings, MD   6 each at 08/18/20 [phone]  . digoxin (LANOXIN) tablet 0.125 mg  0.125 mg Oral QODAY Lama, Marge Duncans, MD      . lidocaine (LIDODERM) 5 % 1 patch  1 patch Transdermal Q24H Jill Alexanders, PA-C   1 patch at 08/19/20 8110  . LORazepam (ATIVAN) tablet 1 mg  1 mg Oral QHS Kyle, Tyrone A, DO   1 mg at 08/18/20 2138  . methocarbamol (ROBAXIN) 500 mg in dextrose 5 % 50 mL IVPB  500 mg Intravenous Q8H Jill Alexanders, PA-C   Stopped at 08/19/20 0536  . metoprolol tartrate (LOPRESSOR) injection 2.5 mg  2.5 mg Intravenous Q6H PRN Oswald Hillock, MD   2.5 mg at 08/19/20 0405  . morphine 2 MG/ML injection 1 mg  1 mg Intravenous Q4H PRN Oswald Hillock, MD   1 mg at 08/19/20 0406  . mupirocin ointment (BACTROBAN) 2 % 1 application  1 application Nasal BID Marybelle Killings, MD   1 application at 31/59/45 2138  . ondansetron (ZOFRAN) tablet 4 mg  4 mg Oral Q6H PRN Marylyn Ishihara, Tyrone A, DO       Or  . ondansetron (ZOFRAN) injection 4 mg  4 mg Intravenous Q6H PRN Kyle, Tyrone A, DO      . Resource ThickenUp Clear   Oral PRN Oswald Hillock, MD      . traMADol Veatrice Bourbon) tablet 50 mg  50 mg Oral Q6H PRN Jill Alexanders, PA-C   50 mg at 08/19/20 0009     Discharge Medications: Please see discharge summary for a list of discharge medications.  Relevant Imaging Results:  Relevant Lab Results:   Additional Information [id number]  Leeroy Cha, RN

## 2020-08-19 NOTE — Progress Notes (Signed)
Patient was more confused, not redirectable, restless, removed her PIV and her catheter. Her HR was at 150's. Paged [facility] and ordered for R wrist soft restraint.

## 2020-08-19 NOTE — Progress Notes (Signed)
Per device rep Brain and Sherrie George RN Pt does not need any lead impedance or monitoring prior to coming to MRI. Aaron Edelman with [employer] will be on sight at 3 pm for post scan evaluation of pacemaker.  ICU will provide monitoring of pt during scan. Confirmed with ICU RN

## 2020-08-19 NOTE — Plan of Care (Signed)
  Problem: Education: Goal: Knowledge of General Education information will improve Description: Including pain rating scale, medication(s)/side effects and non-pharmacologic comfort measures Outcome: Progressing   Problem: Coping: Goal: Level of anxiety will decrease Outcome: Progressing   Problem: Pain Managment: Goal: General experience of comfort will improve Outcome: Progressing   

## 2020-08-19 NOTE — Progress Notes (Signed)
NEUROLOGY CONSULTATION PROGRESS NOTE   Date of service: August 19, 2020 Patient Name: Susan Conway MRN:  [id number] DOB:  07/05/1929  Brief HPI  Susan Conway is a 86 y.o. female with PMH significant for HTN, DM2, Afibb not on [facility] who is admitted following a fall with pelvic fracture and large extraperitoneal hematoma.  Stroke code was called on 08/17/20 with a LKW of 08/17/20 @ 1927 for left sided weakness. CTH with no ICH, ASPECTs of 10. CTA was not obtained due to contrast allergy. No tPA given 2/2 large recent RP bleed. Not a candidate for thrombectomy as CTA was not pursued 2/2 idoinated contrast allergy. MRI Brain was obtained and demosntrated a Right Basal Ganglia ischemic stroke. MRA head and neck with signal loss in BL proximal R M2 MCA, resolved on subsequent MRA.   NIHSS components Score: Comment  1a Level of Conscious 0[x]  1[]  2[]  3[]      1b LOC Questions 0[x]  1[]  2[]       1c LOC Commands 0[x]  1[]  2[]       2 Best Gaze 0[x]  1[]  2[]       3 Visual 0[x]  1[]  2[]  3[]      4 Facial Palsy 0[]  1[]  2[x]  3[]      5a Motor Arm - left 0[]  1[]  2[]  3[]  4[x]  UN[]    5b Motor Arm - Right 0[x]  1[]  2[]  3[]  4[]  UN[]    6a Motor Leg - Left 0[]  1[]  2[]  3[]  4[x]  UN[]    6b Motor Leg - Right 0[x]  1[]  2[]  3[]  4[]  UN[]    7 Limb Ataxia 0[x]  1[]  2[]  3[]  UN[]     8 Sensory 0[x]  1[]  2[]  UN[]      9 Best Language 0[x]  1[]  2[]  3[]      10 Dysarthria 0[]  1[x]  2[]  UN[]      11 Extinct. and Inattention 0[x]  1[]  2[]       TOTAL: 11    MRS: 0 (lives by herself and gets help PRN) TPA: not a candidate 2/2 RP bleed Thrombectomy: was not pursued 2/2 contrast dye allergy.  Vitals   Vitals:   08/19/20 0700 08/19/20 0800 08/19/20 0900 08/19/20 1000  BP:  (!) 154/99    Pulse: (!) 104 (!) 116 (!) 124 (!) 106  Resp: 19 (!) 25 (!) 21 18  Temp:      TempSrc:      SpO2: 98% 99% 98% 98%  Weight:      Height:         Body mass index is 22.42 kg/m.  Physical Exam   General: Laying comfortably in bed; in no acute  distress. HENT: Normal oropharynx and mucosa. Normal external appearance of ears and nose. Neck: Supple, no pain or tenderness CV: No JVD. No peripheral edema. Pulmonary: Symmetric Chest rise. Normal respiratory effort. Abdomen: Soft to touch, non-tender.  Ext: No cyanosis, edema, or deformity  Skin: No rash. Normal palpation of skin.   Musculoskeletal: Normal digits and nails by inspection. No clubbing.   Neurologic Examination  Mental status/Cognition: Alert, oriented to self, place, month and year, good attention.  Speech/language: Fluent, comprehension intact, object naming intact, repetition intact.  Cranial nerves:   CN II Pupils equal and reactive to light, no VF deficits    CN III,IV,VI EOM intact, no gaze preference or deviation, no nystagmus    CN V normal sensation in V1, V2, and V3 segments bilaterally    CN VII L facial droop   CN VIII normal hearing to speech   CN IX & X  normal palatal elevation, no uvular deviation   CN XI 5/5 head turn and 5/5 shoulder shrug bilaterally   CN XII midline tongue protrusion   Motor:  Muscle bulk: poor, tone flaccid in LUE and LLE, tremor none Mvmt Root Nerve  Muscle Right Left Comments  SA C5/6 Ax Deltoid 5 0   EF C5/6 Mc Biceps 5 0   EE C6/7/8 Rad Triceps 5 0   WF C6/7 Med FCR 5 0   WE C7/8 PIN ECU 5 0   F Ab C8/T1 U ADM/FDI 5 0   HF L1/2/3 Fem Illopsoas 5 0   KE L2/3/4 Fem Quad 5 0   DF L4/5 D Peron Tib Ant 5 0   PF S1/2 Tibial Grc/Sol 5 0    Reflexes:  Right Left Comments  Pectoralis      Biceps (C5/6) 2 2   Brachioradialis (C5/6) 2 2    Triceps (C6/7) 2 2    Patellar (L3/4) 2 2    Achilles (S1) 3 3    Hoffman      Plantar     Jaw jerk    Sensation:  Light touch Intact throughout   Pin prick    Temperature    Vibration   Proprioception    Coordination/Complex Motor:  - Finger to Nose intact in RUE, weakness prevents evaluation in LUE   Labs   Basic Metabolic Panel:  Lab Results  Component Value Date    NA 137 08/18/2020   K 3.9 08/18/2020   CO2 23 08/18/2020   GLUCOSE 134 (H) 08/18/2020   BUN 21 08/18/2020   CREATININE 1.01 (H) 08/18/2020   CALCIUM 8.7 (L) 08/18/2020   GFRNONAA 49 (L) 08/18/2020   GFRAA >60 05/26/2019   HbA1c:  Lab Results  Component Value Date   HGBA1C 6.0 03/25/2019   LDL:  Lab Results  Component Value Date   LDLCALC 106 03/25/2019   Urine Drug Screen: No results found for: LABOPIA, COCAINSCRNUR, LABBENZ, AMPHETMU, THCU, LABBARB  Alcohol Level No results found for: ETH No results found for: PHENYTOIN, ZONISAMIDE, LAMOTRIGINE, LEVETIRACETA No results found for: PHENYTOIN, PHENOBARB, VALPROATE, CBMZ  Imaging and Diagnostic studies  Results for orders placed during the hospital encounter of 08/17/20  MR ANGIO HEAD WO CONTRAST  Addendum 08/18/2020  3:42 PM ADDENDUM REPORT: 08/18/2020 15:40  ADDENDUM: Findings discussed with Dr. Darrick Meigs at 3:38 PM.   Electronically Signed By: Margaretha Sheffield MD On: 08/18/2020 15:40  Narrative CLINICAL DATA:  Atrial fibrillation.  TIA versus stroke.  EXAM: MRI HEAD WITHOUT CONTRAST  MRA HEAD WITHOUT CONTRAST  TECHNIQUE: Multiplanar, multiecho pulse sequences of the brain and surrounding structures were obtained without intravenous contrast. Angiographic images of the head were obtained using MRA technique without contrast.  COMPARISON:  Same day CT head  FINDINGS: MRI HEAD FINDINGS  Brain: There is an acute infarct within the posterior limb of the internal capsule, which involves a portion of the posterior putamen and extends into the overlying right corona radiata. Remote lacunar infarcts involving bilateral cerebellar hemispheres and possibly the left frontal white matter. Mild edema without substantial mass effect. Additional scattered T2/FLAIR hyperintensities throughout the white matter is compatible with chronic microvascular ischemic disease. Mild to moderate diffuse cerebral volume loss with ex  vacuo ventricular dilation. No acute hemorrhage. No hydrocephalus.  Skull and upper cervical spine: Normal marrow signal.  Sinuses/Orbits: Mild scattered paranasal sinus mucosal thickening. No acute orbital abnormality.  Other: No mastoid effusions.  MRA HEAD FINDINGS  Vascular evaluation is limited by patient motion. Within this limitation:  Anterior circulation: There is apparent signal loss involving bilateral proximal right M2 MCA divisions, concerning for stenosis but poorly evaluated on this motion limited exam. Late branching of the left MCA bifurcation. No evidence of significant stenosis proximally involving the left MCA or both ACAs. no evidence of aneurysm on this motionless limited exam.  Posterior circulation: Diminutive left V4 vertebral artery, likely secondary to non dominant vertebral artery. Bilateral fetal type PCAs. Likely moderate stenosis of the right proximal P2 PCA. No significant stenosis of the left PCA. No evidence of aneurysm on this motion limited exam.  IMPRESSION: 1. Acute infarct in the right basal ganglia, extending into the overlying corona radiata. Mild edema without substantial mass effect. 2. There is apparent signal loss involving bilateral proximal right M2 MCA divisions, concerning for stenosis but poorly evaluated on this motion limited exam. A CTA could further evaluate and quantify the degree of stenosis if clinically indicated. 3. Bilateral fetal type PCAs with likely moderate stenosis of the right proximal P2 PCA. 4. Diminutive left V4 vertebral artery, likely secondary to non dominant vertebral artery. Dedicated neck CTA or MRA imaging could further evaluate if clinically indicated. 5. Chronic microvascular ischemic disease and remote lacunar infarcts, as detailed above.  Electronically Signed: By: Margaretha Sheffield MD On: 08/18/2020 14:54   Impression   KATHLEENE BERGEMANN is a 86 y.o. female with PMH significant for HTN, DM2,  Afibb not on AC who we evaluated for a R Basal ganglia stroke with a signal dropout in R MCA M2 likely due to an emboli 2/2 her known Afibb. The flow void was not noted on MRA obtained later. NIHSS of 11.  I discussed with patient and her daughter regarding the need for Anticoagulation to reduce risk of strokes in the future due to known hx of Afibb. However, she has a large extraperitoneal bleed and due to her hx of falls, is not a candidate for [facility] at this time. We should atlead do aspirin if okay with team. Recommendations  Plan:  Recommend that primary team order following: - Recommend obtaining TTE (orered) - Recommend obtaining Lipid panel with LDL (ordered) - Please start statin if LDL > 70 - Recommend HbA1c (ordered) - Antithrombotic - Aspirin 81mg  daily - Recommend DVT ppx - SBP goal - gradual normotension - Recommend Telemetry monitoring for arrythmia - Recommend bedside swallow screen prior to PO intake. - Stroke education booklet - Recommend PT/OT/SLP consult  ______________________________________________________________________   Thank you for the opportunity to take part in the care of this patient. If you have any further questions, please contact the neurology consultation attending.  Signed,  [place] Pager Number [phone]

## 2020-08-19 NOTE — Progress Notes (Signed)
SLP Cancellation Note  Patient Details Name: Susan Conway MRN: [id number] DOB: 19-Mar-1929   Cancelled treatment:        MBS was planned for this morning however RN reported pt very sleepy, did not sleep last night and study deferred to this afternoon. Pt MRI with plan for MBS immediately following. At 1530 notified that pt taken back to room in ICU instead of xray. Will plan for MBS 10/15.    [place] Siren 08/19/2020, 5:08 PM   Orbie Pyo Colvin Caroli.Ed Risk analyst [phone] Office [phone]

## 2020-08-19 NOTE — Progress Notes (Signed)
Subjective:    Patient reports pain as mild to moderate. MRI positive for stroke.  Able to take some PO's per speech therapy eval.  Confused last PM and pulled out catheter. Now hand restraints.  Objective: Vital signs in last 24 hours: Temp:  [97.6 F (36.4 C)-99.7 F (37.6 C)] 99.7 F (37.6 C) (10/14 0327) Pulse Rate:  [84-132] 116 (10/14 0800) Resp:  [15-38] 25 (10/14 0800) BP: (143-197)/(57-104) 154/99 (10/14 0800) SpO2:  [96 %-100 %] 99 % (10/14 0800)  Intake/Output from previous day: 10/13 0701 - 10/14 0700 In: 1158.1 [I.V.:959.3; IV Piggyback:198.8] Out: 670 [Urine:670] Intake/Output this shift: No intake/output data recorded.  Recent Labs    08/17/20 0731 08/17/20 1322 08/18/20 0255  HGB 11.2* 9.7* 9.1*   Recent Labs    08/17/20 1322 08/18/20 0255  WBC 12.7* 15.9*  RBC 3.35* 3.09*  HCT 31.3* 29.2*  PLT 81* 81*   Recent Labs    08/17/20 0731 08/18/20 0255  NA 142 137  K 3.7 3.9  CL 105 106  CO2 24 23  BUN 24* 21  CREATININE 0.89 1.01*  GLUCOSE 112* 134*  CALCIUM 9.3 8.7*   Recent Labs    08/17/20 1322  INR 1.2    Compartment soft MR ANGIO HEAD WO CONTRAST  Addendum Date: 08/18/2020   ADDENDUM REPORT: 08/18/2020 15:40 ADDENDUM: Findings discussed with Dr. Darrick Meigs at 3:38 PM. Electronically Signed   By: Margaretha Sheffield MD   On: 08/18/2020 15:40   Result Date: 08/18/2020 CLINICAL DATA:  Atrial fibrillation.  TIA versus stroke. EXAM: MRI HEAD WITHOUT CONTRAST MRA HEAD WITHOUT CONTRAST TECHNIQUE: Multiplanar, multiecho pulse sequences of the brain and surrounding structures were obtained without intravenous contrast. Angiographic images of the head were obtained using MRA technique without contrast. COMPARISON:  Same day CT head FINDINGS: MRI HEAD FINDINGS Brain: There is an acute infarct within the posterior limb of the internal capsule, which involves a portion of the posterior putamen and extends into the overlying right corona radiata. Remote  lacunar infarcts involving bilateral cerebellar hemispheres and possibly the left frontal white matter. Mild edema without substantial mass effect. Additional scattered T2/FLAIR hyperintensities throughout the white matter is compatible with chronic microvascular ischemic disease. Mild to moderate diffuse cerebral volume loss with ex vacuo ventricular dilation. No acute hemorrhage. No hydrocephalus. Skull and upper cervical spine: Normal marrow signal. Sinuses/Orbits: Mild scattered paranasal sinus mucosal thickening. No acute orbital abnormality. Other: No mastoid effusions. MRA HEAD FINDINGS Vascular evaluation is limited by patient motion. Within this limitation: Anterior circulation: There is apparent signal loss involving bilateral proximal right M2 MCA divisions, concerning for stenosis but poorly evaluated on this motion limited exam. Late branching of the left MCA bifurcation. No evidence of significant stenosis proximally involving the left MCA or both ACAs. no evidence of aneurysm on this motionless limited exam. Posterior circulation: Diminutive left V4 vertebral artery, likely secondary to non dominant vertebral artery. Bilateral fetal type PCAs. Likely moderate stenosis of the right proximal P2 PCA. No significant stenosis of the left PCA. No evidence of aneurysm on this motion limited exam. IMPRESSION: 1. Acute infarct in the right basal ganglia, extending into the overlying corona radiata. Mild edema without substantial mass effect. 2. There is apparent signal loss involving bilateral proximal right M2 MCA divisions, concerning for stenosis but poorly evaluated on this motion limited exam. A CTA could further evaluate and quantify the degree of stenosis if clinically indicated. 3. Bilateral fetal type PCAs with likely moderate stenosis of  the right proximal P2 PCA. 4. Diminutive left V4 vertebral artery, likely secondary to non dominant vertebral artery. Dedicated neck CTA or MRA imaging could further  evaluate if clinically indicated. 5. Chronic microvascular ischemic disease and remote lacunar infarcts, as detailed above. Electronically Signed: By: Margaretha Sheffield MD On: 08/18/2020 14:54   MR BRAIN WO CONTRAST  Addendum Date: 08/18/2020   ADDENDUM REPORT: 08/18/2020 15:40 ADDENDUM: Findings discussed with Dr. Darrick Meigs at 3:38 PM. Electronically Signed   By: Margaretha Sheffield MD   On: 08/18/2020 15:40   Result Date: 08/18/2020 CLINICAL DATA:  Atrial fibrillation.  TIA versus stroke. EXAM: MRI HEAD WITHOUT CONTRAST MRA HEAD WITHOUT CONTRAST TECHNIQUE: Multiplanar, multiecho pulse sequences of the brain and surrounding structures were obtained without intravenous contrast. Angiographic images of the head were obtained using MRA technique without contrast. COMPARISON:  Same day CT head FINDINGS: MRI HEAD FINDINGS Brain: There is an acute infarct within the posterior limb of the internal capsule, which involves a portion of the posterior putamen and extends into the overlying right corona radiata. Remote lacunar infarcts involving bilateral cerebellar hemispheres and possibly the left frontal white matter. Mild edema without substantial mass effect. Additional scattered T2/FLAIR hyperintensities throughout the white matter is compatible with chronic microvascular ischemic disease. Mild to moderate diffuse cerebral volume loss with ex vacuo ventricular dilation. No acute hemorrhage. No hydrocephalus. Skull and upper cervical spine: Normal marrow signal. Sinuses/Orbits: Mild scattered paranasal sinus mucosal thickening. No acute orbital abnormality. Other: No mastoid effusions. MRA HEAD FINDINGS Vascular evaluation is limited by patient motion. Within this limitation: Anterior circulation: There is apparent signal loss involving bilateral proximal right M2 MCA divisions, concerning for stenosis but poorly evaluated on this motion limited exam. Late branching of the left MCA bifurcation. No evidence of significant  stenosis proximally involving the left MCA or both ACAs. no evidence of aneurysm on this motionless limited exam. Posterior circulation: Diminutive left V4 vertebral artery, likely secondary to non dominant vertebral artery. Bilateral fetal type PCAs. Likely moderate stenosis of the right proximal P2 PCA. No significant stenosis of the left PCA. No evidence of aneurysm on this motion limited exam. IMPRESSION: 1. Acute infarct in the right basal ganglia, extending into the overlying corona radiata. Mild edema without substantial mass effect. 2. There is apparent signal loss involving bilateral proximal right M2 MCA divisions, concerning for stenosis but poorly evaluated on this motion limited exam. A CTA could further evaluate and quantify the degree of stenosis if clinically indicated. 3. Bilateral fetal type PCAs with likely moderate stenosis of the right proximal P2 PCA. 4. Diminutive left V4 vertebral artery, likely secondary to non dominant vertebral artery. Dedicated neck CTA or MRA imaging could further evaluate if clinically indicated. 5. Chronic microvascular ischemic disease and remote lacunar infarcts, as detailed above. Electronically Signed: By: Margaretha Sheffield MD On: 08/18/2020 14:54   VAS US CAROTID  Result Date: 08/18/2020 Carotid Arterial Duplex Study Indications:       Stroke, new left-sided weakness. Comparison Study:  No prior studies. Performing Technologist: Darlin Coco  Examination Guidelines: A complete evaluation includes B-mode imaging, spectral Doppler, color Doppler, and power Doppler as needed of all accessible portions of each vessel. Bilateral testing is considered an integral part of a complete examination. Limited examinations for reoccurring indications may be performed as noted.  Right Carotid Findings: +----------+--------+--------+--------+------------------+------------------+           PSV cm/sEDV cm/sStenosisPlaque DescriptionComments            +----------+--------+--------+--------+------------------+------------------+  CCA Prox  53      11                                intimal thickening +----------+--------+--------+--------+------------------+------------------+ CCA Distal56      12              heterogenous                         +----------+--------+--------+--------+------------------+------------------+ ICA Prox  49      17      1-39%   heterogenous                         +----------+--------+--------+--------+------------------+------------------+ ICA Distal85      27                                                   +----------+--------+--------+--------+------------------+------------------+ ECA       64                                                           +----------+--------+--------+--------+------------------+------------------+ +----------+--------+-------+----------------+-------------------+           PSV cm/sEDV cmsDescribe        Arm Pressure (mmHG) +----------+--------+-------+----------------+-------------------+ [id number]             Multiphasic, WNL                    +----------+--------+-------+----------------+-------------------+ +---------+--------+--+--------+--+---------+ VertebralPSV cm/s60EDV cm/s26Antegrade +---------+--------+--+--------+--+---------+  Left Carotid Findings: +----------+--------+--------+--------+------------------+------------------+           PSV cm/sEDV cm/sStenosisPlaque DescriptionComments           +----------+--------+--------+--------+------------------+------------------+ CCA Prox  77      17                                intimal thickening +----------+--------+--------+--------+------------------+------------------+ CCA Distal85      23              calcific                             +----------+--------+--------+--------+------------------+------------------+ ICA Prox  74      21      1-39%    heterogenous                         +----------+--------+--------+--------+------------------+------------------+ ICA Distal92      27                                tortuous           +----------+--------+--------+--------+------------------+------------------+ ECA       82                                                           +----------+--------+--------+--------+------------------+------------------+ +----------+--------+--------+----------------+-------------------+  PSV cm/sEDV cm/sDescribe        Arm Pressure (mmHG) +----------+--------+--------+----------------+-------------------+ [id number]             Multiphasic, WNL                    +----------+--------+--------+----------------+-------------------+ +---------+--------+--+--------+--+---------+ VertebralPSV cm/s75EDV cm/s12Antegrade +---------+--------+--+--------+--+---------+   Summary: Right Carotid: Velocities in the right ICA are consistent with a 1-39% stenosis. Left Carotid: Velocities in the left ICA are consistent with a 1-39% stenosis. Vertebrals:  Bilateral vertebral arteries demonstrate antegrade flow. Subclavians: Normal flow hemodynamics were seen in bilateral subclavian              arteries. *See table(s) above for measurements and observations.  Electronically signed by Antony Contras MD on 08/18/2020 at 10:49:24 AM.    Final     Assessment/Plan:    Plan:  Continue bedrest, OK logroll. Platelets at 81K. Stable. May be able to mobilize next week to recliner.   Marybelle Killings 08/19/2020, 9:15 AM

## 2020-08-20 ENCOUNTER — Inpatient Hospital Stay (HOSPITAL_COMMUNITY): Payer: Medicare Other

## 2020-08-20 DIAGNOSIS — I361 Nonrheumatic tricuspid (valve) insufficiency: Secondary | ICD-10-CM | POA: Diagnosis not present

## 2020-08-20 DIAGNOSIS — S36892A Contusion of other intra-abdominal organs, initial encounter: Secondary | ICD-10-CM | POA: Diagnosis not present

## 2020-08-20 DIAGNOSIS — I340 Nonrheumatic mitral (valve) insufficiency: Secondary | ICD-10-CM

## 2020-08-20 DIAGNOSIS — S32402A Unspecified fracture of left acetabulum, initial encounter for closed fracture: Secondary | ICD-10-CM | POA: Diagnosis not present

## 2020-08-20 DIAGNOSIS — S32502A Unspecified fracture of left pubis, initial encounter for closed fracture: Secondary | ICD-10-CM | POA: Diagnosis not present

## 2020-08-20 LAB — ECHOCARDIOGRAM COMPLETE
Area-P 1/2: 4.06 cm2
Height: 66 in
S' Lateral: 2.1 cm
Weight: 2222.24 [oz_av]

## 2020-08-20 LAB — HEMOGLOBIN A1C
Hgb A1c MFr Bld: 5.7 % — ABNORMAL HIGH (ref 4.8–5.6)
Mean Plasma Glucose: 117 mg/dL

## 2020-08-20 MED ORDER — ASPIRIN EC 81 MG PO TBEC
81.0000 mg | DELAYED_RELEASE_TABLET | Freq: Every day | ORAL | Status: DC
  Administered 2020-08-21 – 2020-08-23 (×3): 81 mg via ORAL
  Filled 2020-08-20 (×3): qty 1

## 2020-08-20 NOTE — TOC Progression Note (Signed)
Transition of Care [facility]) - Progression Note    Patient Details  Name: Susan Conway MRN: [id number] Date of Birth: [birth year]  Transition of Care [facility]) CM/SW Contact  Jiraiya Mcewan, Juliann Pulse, RN Phone Number: 08/20/2020, 9:35 AM  Clinical Narrative:await PT eval, & recc if SNF needed.[facility] already following.       Expected Discharge Plan: [place] Barriers to Discharge: Barriers Unresolved (comment)  Expected Discharge Plan and Services Expected Discharge Plan: [place]   Discharge Planning Services: CM Consult   Living arrangements for the past 2 months: [place]                                       Social Determinants of Health (SDOH) Interventions    Readmission Risk Interventions No flowsheet data found.

## 2020-08-20 NOTE — Progress Notes (Signed)
[facility]  PROGRESS NOTE  Susan Conway BMW:[id number] DOB: 12-Nov-1928 DOA: 08/17/2020 PCP: [name], DO   Brief HPI:   86 year old female with history of atrial fibrillation who was admitted with pelvic and sacral fracture with extraperitoneal hemorrhage after patient fell at home.  Orthopedic surgery and trauma surgery were consulted.  Patient also developed new left-sided weakness last night and was seen by tele neurology for code stroke.  CT head was negative for stroke.  MRI brain showed right basal ganglia stroke.    Subjective   Patient seen and examined, continues to have left hemiparesis.  Aphasia has resolved.     Assessment/Plan:     1. Right basal ganglia stroke-CT head was negative for CVA however MRI brain showed right basal ganglia stroke.  MRA head showed acute infarct in the right basal ganglia extending into overlying corona radiator.  Mild edema without substantial mass-effect.  Also shows apparent signal loss involving bilateral proximal right M2 MCA division, concerning for stenosis.  MRA head and neck with contrast ordered after discussion with neurology.  MRA head and neck shows no significant stenotic lesion.  Echocardiogram shows no thrombus.  Patient not on statins due to cramps in her calves.  LDL 81.3.  Hemoglobin A1c is 5.7.  Patient started on aspirin 81 mg daily as per neurology recommendation.  Likely had stroke from atrial fibrillation, she is not on anticoagulation as she had ruptured Baker's cyst and she was taken off anticoagulation by cardiology. 2. Hypertension-blood pressure is elevated, started on metoprolol 2.5 mg IV every 6 hours as needed for BP greater than 180/110 for permissive hypertension.  We will greatly to start home medications. 3. Pelvic fracture-patient has sacral hematoma, orthopedic surgery recommending no surgical intervention at this time.  Patient can start working with physical therapy and gradually start bearing weight.    Patient can be started on antithrombotic agents if needed in 5 days as per orthopedics. 4. Paroxysmal atrial fibrillation-patient has PAF with sinus node dysfunction, s/p PPM.  Patient is not on anticoagulation due to previously ruptured Baker's cyst.  Cardiology had taken her off of anticoagulation.   We will start her on digoxin for tachycardia as now she is on dysphagia 3 diet. 5. Thrombocytopenia-platelet count is 81,000, unclear etiology.  Follow peripheral smear review.     COVID-19 Labs  No results for input(s): DDIMER, FERRITIN, LDH, CRP in the last 72 hours.  Lab Results  Component Value Date   [employer] 08/17/2020   [place] NEGATIVE 05/23/2019     Scheduled medications:   .  stroke: mapping our early stages of recovery book   Does not apply Once  . acetaminophen  1,000 mg Oral Q6H  . aspirin  325 mg Oral Daily  . calcium-vitamin D  1 tablet Oral Daily  . Chlorhexidine Gluconate Cloth  6 each Topical Daily  . digoxin  0.125 mg Oral QODAY  . lidocaine  1 patch Transdermal Q24H  . LORazepam  1 mg Oral QHS  . mupirocin ointment  1 application Nasal BID  . polyethylene glycol  17 g Oral Daily         CBG: Recent Labs  Lab 08/17/20 2249  GLUCAP 149*    SpO2: 95 % O2 Flow Rate (L/min): 1 L/min    CBC: Recent Labs  Lab 08/17/20 0731 08/17/20 1322 08/18/20 0255  WBC 15.5* 12.7* 15.9*  NEUTROABS 11.9* 11.1*  --   HGB 11.2* 9.7* 9.1*  HCT 35.0* 31.3*  29.2*  MCV 92.3 93.4 94.5  PLT 84* 81* 81*    Basic Metabolic Panel: Recent Labs  Lab 08/17/20 0731 08/18/20 0255  NA 142 137  K 3.7 3.9  CL 105 106  CO2 24 23  GLUCOSE 112* 134*  BUN 24* 21  CREATININE 0.89 1.01*  CALCIUM 9.3 8.7*     Liver Function Tests: Recent Labs  Lab 08/17/20 0731 08/18/20 0255  AST 32 26  ALT 24 21  ALKPHOS 62 53  BILITOT 1.2 1.2  PROT 6.5 6.1*  ALBUMIN 3.7 3.4*     Antibiotics: Anti-infectives (From admission, onward)   None       DVT  prophylaxis: SCDs  Code Status: Full code  Family Communication: Discussed with patient's daughter on phone    Status is: Inpatient  Dispo: The patient is from: Home              Anticipated d/c is to: Home              Anticipated d/c date is: 08/21/2020              Patient currently not medically stable for discharge  Barrier to discharge-awaiting PT recommendations  Pressure Injury 08/18/20 Knee Left;Posterior Stage 1 -  Intact skin with non-blanchable redness of a localized area usually over a bony prominence. (Active)  08/18/20 0200  Location: Knee  Location Orientation: Left;Posterior  Staging: Stage 1 -  Intact skin with non-blanchable redness of a localized area usually over a bony prominence.  Wound Description (Comments):   Present on Admission: Yes         Consultants:  Orthopedics  Trauma surgery  Procedures:     Objective   Vitals:   08/19/20 1616 08/19/20 2113 08/20/20 0004 08/20/20 0422  BP: (!) 150/94 (!) 157/78 (!) 156/75 (!) 141/79  Pulse: 95 (!) 101 93 98  Resp: 20 18 14 16   Temp: (!) 97.5 F (36.4 C) 99 F (37.2 C) 98.1 F (36.7 C) 98 F (36.7 C)  TempSrc: Oral Oral Oral Oral  SpO2: 96% 97% 97% 95%  Weight:      Height:        Intake/Output Summary (Last 24 hours) at 08/20/2020 1548 Last data filed at 08/20/2020 1336 Gross per 24 hour  Intake 360 ml  Output 400 ml  Net -40 ml    10/13 1901 - 10/15 0700 In: 456 [P.O.:180; I.V.:177.2] Out: 700 [Urine:700]  Filed Weights   08/17/20 0529 08/17/20 1927 08/18/20 0230  Weight: 61.2 kg 61.4 kg 63 kg    Physical Examination:  General-appears in no acute distress Heart-S1-S2, regular, no murmur auscultated Lungs-clear to auscultation bilaterally, no wheezing or crackles auscultated Abdomen-soft, nontender, no organomegaly Extremities-no edema in the lower extremities Neuro-alert, oriented x3, left hemiparesis  Data Reviewed:   Recent Results (from the past 240 hour(s))   Respiratory Panel by RT PCR (Flu A&B, Covid) - Nasopharyngeal Swab     Status: None   Collection Time: 08/17/20  7:07 AM   Specimen: Nasopharyngeal Swab  Result Value Ref Range Status   SARS Coronavirus 2 by RT PCR NEGATIVE NEGATIVE Final    Comment: (NOTE) SARS-CoV-2 target nucleic acids are NOT DETECTED.  The SARS-CoV-2 RNA is generally detectable in upper respiratoy specimens during the acute phase of infection. The lowest concentration of SARS-CoV-2 viral copies this assay can detect is 131 copies/mL. A negative result does not preclude SARS-Cov-2 infection and should not be used as the sole basis  for treatment or other patient management decisions. A negative result may occur with  improper specimen collection/handling, submission of specimen other than nasopharyngeal swab, presence of viral mutation(s) within the areas targeted by this assay, and inadequate number of viral copies (<131 copies/mL). A negative result must be combined with clinical observations, patient history, and epidemiological information. The expected result is Negative.  Fact Sheet for Patients:  PinkCheek.be  Fact Sheet for Healthcare Providers:  GravelBags.it  This test is no t yet approved or cleared by the [place] FDA and  has been authorized for detection and/or diagnosis of SARS-CoV-2 by FDA under an Emergency Use Authorization (EUA). This EUA will remain  in effect (meaning this test can be used) for the duration of the COVID-19 declaration under Section 564(b)(1) of the Act, 21 U.S.C. section 360bbb-3(b)(1), unless the authorization is terminated or revoked sooner.     Influenza A by PCR NEGATIVE NEGATIVE Final   Influenza B by PCR NEGATIVE NEGATIVE Final    Comment: (NOTE) The Xpert Xpress SARS-CoV-2/FLU/RSV assay is intended as an aid in  the diagnosis of influenza from Nasopharyngeal swab specimens and  should not be used as  a sole basis for treatment. Nasal washings and  aspirates are unacceptable for Xpert Xpress SARS-CoV-2/FLU/RSV  testing.  Fact Sheet for Patients: PinkCheek.be  Fact Sheet for Healthcare Providers: GravelBags.it  This test is not yet approved or cleared by the [place] FDA and  has been authorized for detection and/or diagnosis of SARS-CoV-2 by  FDA under an Emergency Use Authorization (EUA). This EUA will remain  in effect (meaning this test can be used) for the duration of the  Covid-19 declaration under Section 564(b)(1) of the Act, 21  U.S.C. section 360bbb-3(b)(1), unless the authorization is  terminated or revoked. Performed at [facility], [place] [address] [place], [place] [address]   Surgical PCR screen     Status: Abnormal   Collection Time: 08/18/20 12:48 AM   Specimen: Nasal Mucosa; Nasal Swab  Result Value Ref Range Status   MRSA, PCR NEGATIVE NEGATIVE Final   Staphylococcus aureus POSITIVE (A) NEGATIVE Final    Comment: (NOTE) The Xpert SA Assay (FDA approved for NASAL specimens in patients 57 years of age and older), is one component of a comprehensive surveillance program. It is not intended to diagnose infection nor to guide or monitor treatment. Performed at [facility], [place] [address] [place], [place] [address]     No results for input(s): LIPASE, AMYLASE in the last 168 hours. No results for input(s): AMMONIA in the last 168 hours.  Cardiac Enzymes: No results for input(s): CKTOTAL, CKMB, CKMBINDEX, TROPONINI in the last 168 hours. BNP (last 3 results) No results for input(s): BNP in the last 8760 hours.  ProBNP (last 3 results) No results for input(s): PROBNP in the last 8760 hours.  Studies:  MR ANGIO NECK W WO CONTRAST  Result Date: 08/19/2020 CLINICAL DATA:  Stroke.  Rule out vertebral artery dissection. EXAM: MRA NECK WITHOUT AND WITH  CONTRAST TECHNIQUE: Multiplanar and multiecho pulse sequences of the neck were obtained without and with intravenous contrast. Angiographic images of the neck were obtained using MRA technique without and with intravenous contrast. CONTRAST:  44mL GADAVIST GADOBUTROL 1 MMOL/ML IV SOLN COMPARISON:  MRI head and MRA head 08/18/2020 FINDINGS: Normal aortic arch.  Proximal great vessels widely patent. Carotid bifurcation widely patent bilaterally without significant stenosis. No irregularity or dissection Right vertebral artery dominant and patent to  the basilar without stenosis Multiple areas of irregular stenosis in the mid and distal left vertebral artery. This vessel is small but patent to the basilar. Irregular stenosis most suggestive of atherosclerotic disease rather than dissection. IMPRESSION: No significant carotid stenosis Right vertebral dominant and widely patent Scattered areas of irregular stenosis in the mid and distal left vertebral artery most consistent with atherosclerotic disease. There is moderate stenosis distal left vertebral artery limiting antegrade flow. Electronically Signed   By: Franchot Gallo M.D.   On: 08/19/2020 15:16   MR BRAIN W CONTRAST  Result Date: 08/19/2020 CLINICAL DATA:  Stroke EXAM: MRI HEAD WITH CONTRAST TECHNIQUE: Multiplanar, multiecho pulse sequences of the brain and surrounding structures were obtained with intravenous contrast. CONTRAST:  11mL GADAVIST GADOBUTROL 1 MMOL/ML IV SOLN COMPARISON:  Unenhanced MRI head 08/18/2020 FINDINGS: Postcontrast only images obtained. No abnormal enhancement is identified. No mass lesion. There is generalized atrophy with chronic microvascular ischemic change in the white matter. Acute infarct right basal ganglia corona radiata best seen on diffusion-weighted imaging performed yesterday. IMPRESSION: No pathologic enhancement in the brain. Electronically Signed   By: Franchot Gallo M.D.   On: 08/19/2020 15:19   DG Swallowing  Func-Speech Pathology  Result Date: 08/20/2020 Objective Swallowing Evaluation: Type of Study: MBS-Modified Barium Swallow Study  Patient Details Name: Susan Conway MRN: [id number] Date of Birth: 1929-07-31 Today's Date: 08/20/2020 Time: SLP Start Time (ACUTE ONLY): 37 -SLP Stop Time (ACUTE ONLY): 1200 SLP Time Calculation (min) (ACUTE ONLY): 30 min Past Medical History: Past Medical History: Diagnosis Date . Actinic keratosis 05/25/2014 . Anxiety  . Atrial fibrillation ([place]) 09/21/2010 . Basal cell carcinoma 05/25/2014  Multiple removed by Dr. Danella Sensing in March 2015: right neck, left neck, scalp  . Bell's palsy 07/18/1979 . Cervicalgia 01/31/2012 . Closed fracture of lumbar vertebra without mention of spinal cord injury 07/17/2005 . Conjunctiva disorder 12/26/2010 . Coronary atherosclerosis of native coronary artery 07/17/1997 . Cramp of limb 08/16/2009 . Degeneration of lumbar or lumbosacral intervertebral disc 01/31/2012 . Disturbance of skin sensation 08/2009 . Dizziness and giddiness 11/08/2011  vertigo . External hemorrhoids without mention of complication 9/56/3875 . External hemorrhoids without mention of complication 64/33/2951 . Ganglion of tendon sheath 08/16/2009 . Leg cramp 10/27/2013  Most frequently the right leg.  . Long term (current) use of anticoagulants 09/2010 . Lumbago 07/2009 . Meralgia paresthetica 07/2007 . MI, old  . Myalgia and myositis, unspecified 01/17/2012 . Osteoporosis  . Other abnormal blood chemistry 04/08/2012 . Other abnormal blood chemistry 2013  hyperglycemia . Other disorder of muscle, ligament, and fascia 04/08/2012 . Other specified cardiac dysrhythmias(427.89) 06/27/2010 . Pain in joint, ankle and foot 10/27/2013  Bilateral since 1998  . Pain in joint, shoulder region 08/12/2012 . Pain in joint, upper arm 12/26/2010 . Pain in limb 01/11/2011 . Pain in thoracic spine 01/31/2012 . Pathologic fracture of vertebrae 01/22/2012 . PVC's (premature ventricular contractions)  . Rash and other  nonspecific skin eruption 08/02/2011 . Senile osteoporosis 07/17/1993 . Unspecified essential hypertension 07/17/1997 . Varicose veins of lower extremities 08/16/2009 . Varicose veins of lower extremities 08/16/2009 Past Surgical History: Past Surgical History: Procedure Laterality Date . COLONOSCOPY WITH PROPOFOL N/A 10/19/2015  Procedure: COLONOSCOPY WITH PROPOFOL;  Surgeon: Gatha Mayer, MD;  Location: [facility];  Service: Endoscopy;  Laterality: N/A; . CORONARY ARTERY BYPASS GRAFT  1998  x2; LIMA to LAD; SVG to diagonal off bypass . HEMORRHOID SURGERY  08/26/2012  Dr. Brantley Stage . LOOP RECORDER INSERTION N/A 05/13/2018  Procedure:  LOOP RECORDER INSERTION;  Surgeon: [name], MD;  Location: [place] [facility];  Service: Cardiovascular;  Laterality: N/A; . LOOP RECORDER REMOVAL N/A 05/26/2019  Procedure: LOOP RECORDER REMOVAL;  Surgeon: [name], MD;  Location: [place] [facility];  Service: Cardiovascular;  Laterality: N/A; . MASS EXCISION Left 11/23/2015  Procedure: LEFT WRIST EXCISION CYST;  Surgeon: Leanora Cover, MD;  Location: [place];  Service: Orthopedics;  Laterality: Left; . PACEMAKER IMPLANT N/A 05/26/2019  Procedure: PACEMAKER IMPLANT;  Surgeon: [name], MD;  Location: [place] [facility];  Service: Cardiovascular;  Laterality: N/A; . SKIN BIOPSY  01/29/14  (R) neck; (R) scalp, 2 (L) neck; shave biopsy Superficial basal cell carcinoma Dr. Danella Sensing . TONSILLECTOMY  1940's HPI: 86 yo female resident of [facility] admit to [facility] after a fall and found to have severe multifocal pelvic fracture and a retroperitoneal hemorrhage. She is thrombocytopenia.  Pt found to have left sided weakness with progressive dysarthria and left facial droop and concern was present for CVA. CT brain negative.  MRI brain showed right basal ganglia CVA.Marland Kitchen  Pt also with h/o Afib, Bell's palsy *1980- impacting the right side of her face per pt, cervicalgia, recent baker's cyst, low platelets recently,   Pt failed a [facility] swallow screen twice and SLP evaluation was ordered later.  Pt denies dysphagia prior to admission.  Subjective: pt awake in bed Assessment / Plan / Recommendation CHL IP CLINICAL IMPRESSIONS 08/20/2020 Clinical Impression Patient presents with a mild-moderate oropharyngeal dysphagia. Oral phase prolongued with lingual weakness and decreased bolus cohesion, delayed oral transit and premature spillage. Oral phase however functional with pureed and soft solid boluses. Pharyngeally, patient with consistent delay in swallow initiation which when combined with decreased laryngeal closure results in penetration and/or aspiration of thin liquids, ranging from transient with small boluses, to frank silent aspiration with large consecutive sips via straw. Note that however, with small straw sips and chin tucked, patient able to protect airway consistently. Mild base of tongue/vallecular residuals remain post swallow due to base of tongue weakness which patient clears with eventual second swallow. As patient did require moderate cueing for consistent use of chin tuck, recommend continuation of current diet to mitigate aspiration risks, advancing at bedside following SLP treatment to ensure proper use. Will f/u.  SLP Visit Diagnosis Dysphagia, oropharyngeal phase (R13.12) Attention and concentration deficit following -- Frontal lobe and executive function deficit following -- Impact on safety and function Mild aspiration risk;Moderate aspiration risk   CHL IP TREATMENT RECOMMENDATION 08/20/2020 Treatment Recommendations Therapy as outlined in treatment plan below   Prognosis 08/20/2020 Prognosis for Safe Diet Advancement Good Barriers to Reach Goals -- Barriers/Prognosis Comment -- CHL IP DIET RECOMMENDATION 08/20/2020 SLP Diet Recommendations Dysphagia 3 (Mech soft) solids;Nectar thick liquid Liquid Administration via Cup;Straw Medication Administration Whole meds with puree Compensations Slow rate;Small  sips/bites;Lingual sweep for clearance of pocketing Postural Changes Seated upright at 90 degrees   CHL IP OTHER RECOMMENDATIONS 08/20/2020 Recommended Consults -- Oral Care Recommendations Oral care BID Other Recommendations Order thickener from pharmacy;Prohibited food (jello, ice cream, thin soups);Remove water pitcher   CHL IP FOLLOW UP RECOMMENDATIONS 08/20/2020 Follow up Recommendations Skilled Nursing facility   [facility] IP FREQUENCY AND DURATION 08/20/2020 Speech Therapy Frequency (ACUTE ONLY) min 3x week Treatment Duration 2 weeks      CHL IP ORAL PHASE 08/20/2020 Oral Phase Impaired Oral - Pudding Teaspoon -- Oral - Pudding Cup -- Oral - Honey Teaspoon -- Oral -  Honey Cup -- Oral - Nectar Teaspoon Weak lingual manipulation;Reduced posterior propulsion;Lingual/palatal residue;Delayed oral transit;Decreased bolus cohesion;Premature spillage Oral - Nectar Cup Weak lingual manipulation;Reduced posterior propulsion;Lingual/palatal residue;Delayed oral transit;Decreased bolus cohesion;Premature spillage Oral - Nectar Straw -- Oral - Thin Teaspoon Weak lingual manipulation;Reduced posterior propulsion;Lingual/palatal residue;Delayed oral transit;Decreased bolus cohesion;Premature spillage Oral - Thin Cup -- Oral - Thin Straw -- Oral - Puree Weak lingual manipulation;Reduced posterior propulsion;Lingual/palatal residue;Delayed oral transit;Decreased bolus cohesion;Premature spillage Oral - Mech Soft Weak lingual manipulation;Reduced posterior propulsion;Lingual/palatal residue;Delayed oral transit;Decreased bolus cohesion;Premature spillage Oral - Regular -- Oral - Multi-Consistency -- Oral - Pill Weak lingual manipulation;Reduced posterior propulsion;Lingual/palatal residue;Delayed oral transit;Decreased bolus cohesion;Premature spillage Oral Phase - Comment --  CHL IP PHARYNGEAL PHASE 08/20/2020 Pharyngeal Phase Impaired Pharyngeal- Pudding Teaspoon -- Pharyngeal -- Pharyngeal- Pudding Cup -- Pharyngeal --  Pharyngeal- Honey Teaspoon -- Pharyngeal -- Pharyngeal- Honey Cup -- Pharyngeal -- Pharyngeal- Nectar Teaspoon Delayed swallow initiation-pyriform sinuses;Reduced tongue base retraction;Pharyngeal residue - valleculae Pharyngeal -- Pharyngeal- Nectar Cup Delayed swallow initiation-pyriform sinuses;Reduced tongue base retraction;Pharyngeal residue - valleculae Pharyngeal -- Pharyngeal- Nectar Straw -- Pharyngeal -- Pharyngeal- Thin Teaspoon Delayed swallow initiation-pyriform sinuses;Reduced tongue base retraction;Pharyngeal residue - valleculae Pharyngeal -- Pharyngeal- Thin Cup -- Pharyngeal -- Pharyngeal- Thin Straw Delayed swallow initiation-pyriform sinuses;Reduced tongue base retraction;Pharyngeal residue - valleculae;Penetration/Aspiration during swallow;Reduced airway/laryngeal closure Pharyngeal Material enters airway, passes BELOW cords without attempt by patient to eject out (silent aspiration);Material enters airway, remains ABOVE vocal cords and not ejected out;Material enters airway, remains ABOVE vocal cords then ejected out Pharyngeal- Puree Delayed swallow initiation-pyriform sinuses;Reduced tongue base retraction;Pharyngeal residue - valleculae Pharyngeal -- Pharyngeal- Mechanical Soft Delayed swallow initiation-pyriform sinuses;Reduced tongue base retraction;Pharyngeal residue - valleculae Pharyngeal -- Pharyngeal- Regular -- Pharyngeal -- Pharyngeal- Multi-consistency -- Pharyngeal -- Pharyngeal- Pill -- Pharyngeal -- Pharyngeal Comment --  Gabriel Rainwater MA, CCC-SLP [place] Leah Meryl 08/20/2020, 12:23 PM              ECHOCARDIOGRAM COMPLETE  Result Date: 08/20/2020    ECHOCARDIOGRAM REPORT   Patient Name:   Susan Conway Date of Exam: 08/20/2020 Medical Rec #:  [id number]   Height:       66.0 in Accession #:    [id number]  Weight:       138.9 lb Date of Birth:  Oct 14, 1929   BSA:          1.713 m Patient Age:    69 years    BP:           141/79 mmHg Patient Gender: F           HR:           98 bpm.  Exam Location:  Inpatient Procedure: 2D Echo, Cardiac Doppler and Color Doppler Indications:    CVA  History:        Patient has prior history of Echocardiogram examinations, most                 recent 04/24/2017. CAD, Prior CABG and Pacemaker, Stroke,                 Arrythmias:Atrial Fibrillation; Risk Factors:Hypertension.  Sonographer:    Dustin Flock Referring Phys: [id number] [place]  1. Left ventricular ejection fraction, by estimation, is 60 to 65%. The left ventricle has normal function. The left ventricle has no regional wall motion abnormalities. There is mild left ventricular hypertrophy. Left ventricular diastolic parameters are indeterminate.  2. Right ventricular systolic function is mildly reduced. The right ventricular  size is mildly enlarged. There is severely elevated pulmonary artery systolic pressure. The estimated right ventricular systolic pressure is 97.3 mmHg.  3. Tricuspid valve regurgitation is severe.  4. Biatrial size moderately dilated by visual estimate. Volumetric assessment suboptimal due to image quality.  5. The mitral valve is myxomatous. Mild mitral valve regurgitation. No evidence of mitral stenosis. Borderline bileaflet mitral valve prolapse.  6. The aortic valve is calcified. There is moderate calcification of the aortic valve. Aortic valve regurgitation is not visualized. Aortic valve sclerosis/calcification is present, without any evidence of aortic stenosis.  7. The inferior vena cava is dilated in size with <50% respiratory variability, suggesting right atrial pressure of 15 mmHg. Conclusion(s)/Recommendation(s): No intracardiac source of embolism detected on this transthoracic study. FINDINGS  Left Ventricle: Left ventricular ejection fraction, by estimation, is 60 to 65%. The left ventricle has normal function. The left ventricle has no regional wall motion abnormalities. The left ventricular internal cavity size was normal in size. There is  mild  left ventricular hypertrophy. Left ventricular diastolic parameters are indeterminate. Indeterminate filling pressures. Right Ventricle: The right ventricular size is mildly enlarged. No increase in right ventricular wall thickness. Right ventricular systolic function is mildly reduced. There is severely elevated pulmonary artery systolic pressure. The tricuspid regurgitant velocity is 3.41 m/s, and with an assumed right atrial pressure of 15 mmHg, the estimated right ventricular systolic pressure is 53.2 mmHg. Left Atrium: Left atrial size was moderately dilated. Right Atrium: Right atrial size was moderately dilated. Pericardium: There is no evidence of pericardial effusion. Mitral Valve: The mitral valve is myxomatous. Mild mitral valve regurgitation. No evidence of mitral valve stenosis. Tricuspid Valve: The tricuspid valve is normal in structure. Tricuspid valve regurgitation is severe. No evidence of tricuspid stenosis. Aortic Valve: The aortic valve is calcified. There is moderate calcification of the aortic valve. Aortic valve regurgitation is not visualized. Mild to moderate aortic valve sclerosis/calcification is present, without any evidence of aortic stenosis. Pulmonic Valve: The pulmonic valve was not well visualized. Pulmonic valve regurgitation is trivial. No evidence of pulmonic stenosis. Aorta: The aortic root is normal in size and structure. Venous: The inferior vena cava is dilated in size with less than 50% respiratory variability, suggesting right atrial pressure of 15 mmHg. IAS/Shunts: No atrial level shunt detected by color flow Doppler. Additional Comments: A is visualized in the right atrium and right ventricle.  LEFT VENTRICLE PLAX 2D LVIDd:         3.00 cm  Diastology LVIDs:         2.10 cm  LV e' medial:    7.29 cm/s LV PW:         1.20 cm  LV E/e' medial:  14.0 LV IVS:        1.00 cm  LV e' lateral:   11.10 cm/s LVOT diam:     2.00 cm  LV E/e' lateral: 9.2 LV SV:         48 LV SV Index:    28 LVOT Area:     3.14 cm  RIGHT VENTRICLE RV Basal diam:  4.00 cm RV S prime:     7.62 cm/s TAPSE (M-mode): 1.7 cm LEFT ATRIUM             Index       RIGHT ATRIUM           Index LA diam:        4.50 cm 2.63 cm/m  RA Area:  25.70 cm LA Vol (A2C):   60.7 ml 35.44 ml/m RA Volume:   76.20 ml  44.49 ml/m LA Vol (A4C):   46.8 ml 27.33 ml/m LA Biplane Vol: 53.3 ml 31.12 ml/m  AORTIC VALVE LVOT Vmax:   92.50 cm/s LVOT Vmean:  64.600 cm/s LVOT VTI:    0.154 m  AORTA Ao Root diam: 3.00 cm MITRAL VALVE                TRICUSPID VALVE MV Area (PHT): 4.06 cm     TR Peak grad:   46.5 mmHg MV Decel Time: 187 msec     TR Vmax:        341.00 cm/s MV E velocity: 102.00 cm/s                             SHUNTS                             Systemic VTI:  0.15 m                             Systemic Diam: 2.00 cm Cherlynn Kaiser MD Electronically signed by Cherlynn Kaiser MD Signature Date/Time: 08/20/2020/9:48:59 AM    Final        Oswald Hillock   [facility] If 7PM-7AM, please contact night-coverage at [email or web address], Office  [phone]   08/20/2020, 3:48 PM  LOS: 3 days

## 2020-08-20 NOTE — Progress Notes (Signed)
Modified Barium Swallow Progress Note  Patient Details  Name: Susan Conway MRN: [id number] Date of Birth: 02-May-1929  Today's Date: 08/20/2020  Modified Barium Swallow completed.  Full report located under Chart Review in the Imaging Section.  Brief recommendations include the following:  Clinical Impression  Patient presents with a mild-moderate oropharyngeal dysphagia. Oral phase prolongued with lingual weakness and decreased bolus cohesion, delayed oral transit and premature spillage. Oral phase however functional with pureed and soft solid boluses. Pharyngeally, patient with consistent delay in swallow initiation which when combined with decreased laryngeal closure results in penetration and/or aspiration of thin liquids, ranging from transient with small boluses, to frank silent aspiration with large consecutive sips via straw. Note that however, with small straw sips and chin tucked, patient able to protect airway consistently. Mild base of tongue/vallecular residuals remain post swallow due to base of tongue weakness which patient clears with eventual second swallow. As patient did require moderate cueing for consistent use of chin tuck, recommend continuation of current diet to mitigate aspiration risks, advancing at bedside following SLP treatment to ensure proper use. Will f/u.    Swallow Evaluation Recommendations       SLP Diet Recommendations: Dysphagia 3 (Mech soft) solids;Nectar thick liquid (trials of thin liquids with SLP only (straw and chin tuck))   Liquid Administration via: Cup;Straw (straw ok with chin tuck)   Medication Administration: Whole meds with puree   Supervision: Patient able to self feed;Full supervision/cueing for compensatory strategies   Compensations: Slow rate;Small sips/bites;Lingual sweep for clearance of pocketing (chin tuck with thin liquids, use straw to facilitate posture)   Postural Changes: Seated upright at 90 degrees   Oral Care  Recommendations: Oral care BID   Other Recommendations: Order thickener from pharmacy;Prohibited food (jello, ice cream, thin soups);Remove water pitcher   Susan Grussing MA, CCC-SLP   [name] 08/20/2020,12:21 PM

## 2020-08-20 NOTE — Progress Notes (Signed)
PT Cancellation Note  Patient Details Name: Susan Conway MRN: [id number] DOB: [birth year]   Cancelled Treatment:    Reason Eval/Treat Not Completed: Medical issues which prohibited therapy Per ortho note yesterday: "Continue bedrest, OK logroll. Platelets at 81K. Stable. May be able to mobilize next week to recliner."  Please update activity level order and provide weight bearing status for pt to mobilize OOB when pt ready.   Cloey Sferrazza,KATHrine E 08/20/2020, 3:28 PM Jannette Spanner PT, DPT Acute Rehabilitation Services Pager: [phone] Office: [phone]

## 2020-08-20 NOTE — Progress Notes (Signed)
Brief Neuro Update:  Stroke workup has been completed. 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 Unfortunately she is not a good candidate for Anticoagulation right now 2/2 pelvic fx with RP bleed. Maybe we can revisit this discussion in the future.  Recs: - continue Aspirin 81mg  daily - LDL of 81.3, discussed with patient and daughter over the phone, unfortunately she had cramps in her calfs and was taken off simvastatin about a year ago. Would not recommend starting a Statin right now. - TTE with EF of 60-65%, no thrombus. - HbA1c of 5.7. - Recommend follow up with stroke clinic within 3 months - continue PT and OT and dispo per primary team. - stroke education booklet was given to patient and her daughter. - Neurology inpatient team will signoff. Please feel free to contact us with any questions or concerns.  [place] Pager Number [phone]

## 2020-08-20 NOTE — Care Management Important Message (Signed)
Important Message  Patient Details IM Letter given to the Patient Name: Susan Conway MRN: [id number] Date of Birth: 03-25-1929   Medicare Important Message Given:  Yes     Kerin Salen 08/20/2020, 12:03 PM

## 2020-08-20 NOTE — Progress Notes (Signed)
  Echocardiogram 2D Echocardiogram has been performed.  Susan Conway 08/20/2020, 8:33 AM

## 2020-08-21 ENCOUNTER — Other Ambulatory Visit (HOSPITAL_COMMUNITY): Payer: Medicare Other

## 2020-08-21 DIAGNOSIS — S36892A Contusion of other intra-abdominal organs, initial encounter: Secondary | ICD-10-CM | POA: Diagnosis not present

## 2020-08-21 DIAGNOSIS — S32502A Unspecified fracture of left pubis, initial encounter for closed fracture: Secondary | ICD-10-CM | POA: Diagnosis not present

## 2020-08-21 DIAGNOSIS — S32402A Unspecified fracture of left acetabulum, initial encounter for closed fracture: Secondary | ICD-10-CM | POA: Diagnosis not present

## 2020-08-21 MED ORDER — HYDRALAZINE HCL 25 MG PO TABS: 25.0000 mg | ORAL_TABLET | Freq: Four times a day (QID) | ORAL | Status: DC | PRN

## 2020-08-21 MED ORDER — BISACODYL 10 MG RE SUPP
10.0000 mg | Freq: Once | RECTAL | Status: AC
  Administered 2020-08-21: 10 mg via RECTAL
  Filled 2020-08-21: qty 1

## 2020-08-21 MED ORDER — ATENOLOL 25 MG PO TABS
25.0000 mg | ORAL_TABLET | Freq: Two times a day (BID) | ORAL | Status: DC
  Administered 2020-08-21 – 2020-08-23 (×5): 25 mg via ORAL
  Filled 2020-08-21 (×5): qty 1

## 2020-08-21 NOTE — Progress Notes (Signed)
[facility]  PROGRESS NOTE  [name] RDE:[id number] DOB: Jul 01, 1929 DOA: 08/17/2020 PCP: [name], DO   Brief HPI:   86 year old female with history of atrial fibrillation who was admitted with pelvic and sacral fracture with extraperitoneal hemorrhage after patient fell at home.  Orthopedic surgery and trauma surgery were consulted.  Patient also developed new left-sided weakness last night and was seen by tele neurology for code stroke.  CT head was negative for stroke.  MRI brain showed right basal ganglia stroke.    Subjective   Patient seen and examined, no new complaints.  PT recommends going to skilled nursing facility for rehab.   Assessment/Plan:     1. Right basal ganglia stroke-CT head was negative for CVA however MRI brain showed right basal ganglia stroke.  MRA head showed acute infarct in the right basal ganglia extending into overlying corona radiator.  Mild edema without substantial mass-effect.  Also shows apparent signal loss involving bilateral proximal right M2 MCA division, concerning for stenosis.  MRA head and neck with contrast ordered after discussion with neurology.  MRA head and neck shows no significant stenotic lesion.  Echocardiogram shows no thrombus.  Patient not on statins due to cramps in her calves.  LDL 81.3.  Hemoglobin A1c is 5.7.  Patient started on aspirin 81 mg daily as per neurology recommendation.  Likely had stroke from atrial fibrillation, she is not on anticoagulation as she had ruptured Baker's cyst and she was taken off anticoagulation by cardiology.  Continue aspirin 81 mg daily.  Likely discharge to skilled nursing facility in 1 to 2 days. 2. Hypertension-blood pressure is elevated, started on metoprolol 2.5 mg IV every 6 hours as needed for BP greater than 180/110 for permissive hypertension.  We will start home Tenormin at lower dose of 25 mg twice a day. 3. Pelvic fracture-patient has sacral hematoma, orthopedic surgery  recommending no surgical intervention at this time.  Patient can start working with physical therapy and gradually start bearing weight.   Patient can be started on antithrombotic agents if needed in 5 days as per orthopedics. 4. Paroxysmal atrial fibrillation-patient has PAF with sinus node dysfunction, s/p PPM.  Patient is not on anticoagulation due to previously ruptured Baker's cyst.  Cardiology had taken her off of anticoagulation.   We will start her on digoxin for tachycardia as now she is on dysphagia 3 diet. 5. Thrombocytopenia-platelet count is 81,000, unclear etiology.  Follow peripheral smear review.  Follow PCP as outpatient.     COVID-19 Labs  No results for input(s): DDIMER, FERRITIN, LDH, CRP in the last 72 hours.  Lab Results  Component Value Date   SARSCOV2NAA NEGATIVE 08/17/2020   [place] NEGATIVE 05/23/2019     Scheduled medications:     stroke: mapping our early stages of recovery book   Does not apply Once   acetaminophen  1,000 mg Oral Q6H   aspirin EC  81 mg Oral Daily   calcium-vitamin D  1 tablet Oral Daily   Chlorhexidine Gluconate Cloth  6 each Topical Daily   digoxin  0.125 mg Oral QODAY   lidocaine  1 patch Transdermal Q24H   LORazepam  1 mg Oral QHS   mupirocin ointment  1 application Nasal BID   polyethylene glycol  17 g Oral Daily         CBG: Recent Labs  Lab 08/17/20 2249  GLUCAP 149*    SpO2: 97 % O2 Flow Rate (L/min): 1 L/min  CBC: Recent Labs  Lab 08/17/20 0731 08/17/20 1322 08/18/20 0255  WBC 15.5* 12.7* 15.9*  NEUTROABS 11.9* 11.1*  --   HGB 11.2* 9.7* 9.1*  HCT 35.0* 31.3* 29.2*  MCV 92.3 93.4 94.5  PLT 84* 81* 81*    Basic Metabolic Panel: Recent Labs  Lab 08/17/20 0731 08/18/20 0255  NA 142 137  K 3.7 3.9  CL 105 106  CO2 24 23  GLUCOSE 112* 134*  BUN 24* 21  CREATININE 0.89 1.01*  CALCIUM 9.3 8.7*     Liver Function Tests: Recent Labs  Lab 08/17/20 0731 08/18/20 0255  AST 32 26   ALT 24 21  ALKPHOS 62 53  BILITOT 1.2 1.2  PROT 6.5 6.1*  ALBUMIN 3.7 3.4*     Antibiotics: Anti-infectives (From admission, onward)   None       DVT prophylaxis: SCDs  Code Status: Full code  Family Communication: Discussed with patient's daughter on phone    Status is: Inpatient  Dispo: The patient is from: Home              Anticipated d/c is to: Home              Anticipated d/c date is: 08/21/2020              Patient currently not medically stable for discharge  Barrier to discharge-awaiting PT recommendations  Pressure Injury 08/18/20 Knee Left;Posterior Stage 1 -  Intact skin with non-blanchable redness of a localized area usually over a bony prominence. (Active)  08/18/20 0200  Location: Knee  Location Orientation: Left;Posterior  Staging: Stage 1 -  Intact skin with non-blanchable redness of a localized area usually over a bony prominence.  Wound Description (Comments):   Present on Admission: Yes         Consultants:  Orthopedics  Trauma surgery  Procedures:     Objective   Vitals:   08/20/20 2015 08/21/20 0413 08/21/20 0932 08/21/20 1239  BP: (!) 169/85 (!) 163/90  (!) 176/82  Pulse: (!) 106 (!) 103 99 75  Resp: 20 16  19   Temp: 99.4 F (37.4 C) 99.1 F (37.3 C)  99.2 F (37.3 C)  TempSrc: Oral Oral  Oral  SpO2: 98% 97%  97%  Weight:      Height:        Intake/Output Summary (Last 24 hours) at 08/21/2020 1522 Last data filed at 08/21/2020 1000 Gross per 24 hour  Intake 450 ml  Output 250 ml  Net 200 ml    10/14 1901 - 10/16 0700 In: 1112.7 [P.O.:630] Out: 450 [Urine:450]  Filed Weights   08/17/20 0529 08/17/20 1927 08/18/20 0230  Weight: 61.2 kg 61.4 kg 63 kg    Physical Examination: General-appears in no acute distress Heart-S1-S2, regular, no murmur auscultated Lungs-clear to auscultation bilaterally, no wheezing or crackles auscultated Abdomen-soft, nontender, no organomegaly Extremities-no edema in the  lower extremities Neuro-alert, oriented x3, left hemiparesis  Data Reviewed:   Recent Results (from the past 240 hour(s))  Respiratory Panel by RT PCR (Flu A&B, Covid) - Nasopharyngeal Swab     Status: None   Collection Time: 08/17/20  7:07 AM   Specimen: Nasopharyngeal Swab  Result Value Ref Range Status   SARS Coronavirus 2 by RT PCR NEGATIVE NEGATIVE Final    Comment: (NOTE) SARS-CoV-2 target nucleic acids are NOT DETECTED.  The SARS-CoV-2 RNA is generally detectable in upper respiratoy specimens during the acute phase of infection. The lowest concentration of SARS-CoV-2  viral copies this assay can detect is 131 copies/mL. A negative result does not preclude SARS-Cov-2 infection and should not be used as the sole basis for treatment or other patient management decisions. A negative result may occur with  improper specimen collection/handling, submission of specimen other than nasopharyngeal swab, presence of viral mutation(s) within the areas targeted by this assay, and inadequate number of viral copies (<131 copies/mL). A negative result must be combined with clinical observations, patient history, and epidemiological information. The expected result is Negative.  Fact Sheet for Patients:  PinkCheek.be  Fact Sheet for Healthcare Providers:  GravelBags.it  This test is no t yet approved or cleared by the [place] FDA and  has been authorized for detection and/or diagnosis of SARS-CoV-2 by FDA under an Emergency Use Authorization (EUA). This EUA will remain  in effect (meaning this test can be used) for the duration of the COVID-19 declaration under Section 564(b)(1) of the Act, 21 U.S.C. section 360bbb-3(b)(1), unless the authorization is terminated or revoked sooner.     Influenza A by PCR NEGATIVE NEGATIVE Final   Influenza B by PCR NEGATIVE NEGATIVE Final    Comment: (NOTE) The Xpert Xpress  SARS-CoV-2/FLU/RSV assay is intended as an aid in  the diagnosis of influenza from Nasopharyngeal swab specimens and  should not be used as a sole basis for treatment. Nasal washings and  aspirates are unacceptable for Xpert Xpress SARS-CoV-2/FLU/RSV  testing.  Fact Sheet for Patients: PinkCheek.be  Fact Sheet for Healthcare Providers: GravelBags.it  This test is not yet approved or cleared by the [place] FDA and  has been authorized for detection and/or diagnosis of SARS-CoV-2 by  FDA under an Emergency Use Authorization (EUA). This EUA will remain  in effect (meaning this test can be used) for the duration of the  Covid-19 declaration under Section 564(b)(1) of the Act, 21  U.S.C. section 360bbb-3(b)(1), unless the authorization is  terminated or revoked. Performed at [facility], [place] [address] [place], [place] [address]   Surgical PCR screen     Status: Abnormal   Collection Time: 08/18/20 12:48 AM   Specimen: Nasal Mucosa; Nasal Swab  Result Value Ref Range Status   MRSA, PCR NEGATIVE NEGATIVE Final   Staphylococcus aureus POSITIVE (A) NEGATIVE Final    Comment: (NOTE) The Xpert SA Assay (FDA approved for NASAL specimens in patients 25 years of age and older), is one component of a comprehensive surveillance program. It is not intended to diagnose infection nor to guide or monitor treatment. Performed at [facility], [place] [address] [place], [place] [address]     No results for input(s): LIPASE, AMYLASE in the last 168 hours. No results for input(s): AMMONIA in the last 168 hours.  Cardiac Enzymes: No results for input(s): CKTOTAL, CKMB, CKMBINDEX, TROPONINI in the last 168 hours. BNP (last 3 results) No results for input(s): BNP in the last 8760 hours.  ProBNP (last 3 results) No results for input(s): PROBNP in the last 8760 hours.  Studies:  DG Swallowing  Func-Speech Pathology  Result Date: 08/20/2020 Objective Swallowing Evaluation: Type of Study: MBS-Modified Barium Swallow Study  Patient Details Name: ANTIA RAHAL MRN: [id number] Date of Birth: 12-11-1928 Today's Date: 08/20/2020 Time: SLP Start Time (ACUTE ONLY): 28 -SLP Stop Time (ACUTE ONLY): 1200 SLP Time Calculation (min) (ACUTE ONLY): 30 min Past Medical History: Past Medical History: Diagnosis Date  Actinic keratosis 05/25/2014  Anxiety   Atrial fibrillation ([place]) 09/21/2010  Basal cell  carcinoma 05/25/2014  Multiple removed by Dr. Danella Sensing in March 2015: right neck, left neck, scalp   Bell's palsy 07/18/1979  Cervicalgia 01/31/2012  Closed fracture of lumbar vertebra without mention of spinal cord injury 07/17/2005  Conjunctiva disorder 12/26/2010  Coronary atherosclerosis of native coronary artery 07/17/1997  Cramp of limb 08/16/2009  Degeneration of lumbar or lumbosacral intervertebral disc 01/31/2012  Disturbance of skin sensation 08/2009  Dizziness and giddiness 11/08/2011  vertigo  External hemorrhoids without mention of complication 1/61/0960  External hemorrhoids without mention of complication 45/40/9811  Ganglion of tendon sheath 08/16/2009  Leg cramp 10/27/2013  Most frequently the right leg.   Long term (current) use of anticoagulants 09/2010  Lumbago 07/2009  Meralgia paresthetica 07/2007  MI, old   Myalgia and myositis, unspecified 01/17/2012  Osteoporosis   Other abnormal blood chemistry 04/08/2012  Other abnormal blood chemistry 2013  hyperglycemia  Other disorder of muscle, ligament, and fascia 04/08/2012  Other specified cardiac dysrhythmias(427.89) 06/27/2010  Pain in joint, ankle and foot 10/27/2013  Bilateral since 1998   Pain in joint, shoulder region 08/12/2012  Pain in joint, upper arm 12/26/2010  Pain in limb 01/11/2011  Pain in thoracic spine 01/31/2012  Pathologic fracture of vertebrae 01/22/2012  PVC's (premature ventricular contractions)   Rash and other  nonspecific skin eruption 08/02/2011  Senile osteoporosis 07/17/1993  Unspecified essential hypertension 07/17/1997  Varicose veins of lower extremities 08/16/2009  Varicose veins of lower extremities 08/16/2009 Past Surgical History: Past Surgical History: Procedure Laterality Date  COLONOSCOPY WITH PROPOFOL N/A 10/19/2015  Procedure: COLONOSCOPY WITH PROPOFOL;  Surgeon: Gatha Mayer, MD;  Location: [facility];  Service: Endoscopy;  Laterality: N/A;  CORONARY ARTERY BYPASS GRAFT  1998  x2; LIMA to LAD; SVG to diagonal off bypass  HEMORRHOID SURGERY  08/26/2012  Dr. Brantley Stage  LOOP RECORDER INSERTION N/A 05/13/2018  Procedure: LOOP RECORDER INSERTION;  Surgeon: [name], MD;  Location: [facility];  Service: Cardiovascular;  Laterality: N/A;  LOOP RECORDER REMOVAL N/A 05/26/2019  Procedure: LOOP RECORDER REMOVAL;  Surgeon: [name], MD;  Location: [facility];  Service: Cardiovascular;  Laterality: N/A;  MASS EXCISION Left 11/23/2015  Procedure: LEFT WRIST EXCISION CYST;  Surgeon: Leanora Cover, MD;  Location: [facility];  Service: Orthopedics;  Laterality: Left;  PACEMAKER IMPLANT N/A 05/26/2019  Procedure: PACEMAKER IMPLANT;  Surgeon: [name], MD;  Location: [facility];  Service: Cardiovascular;  Laterality: N/A;  SKIN BIOPSY  01/29/14  (R) neck; (R) scalp, 2 (L) neck; shave biopsy Superficial basal cell carcinoma Dr. Danella Sensing  TONSILLECTOMY  [phone] HPI: 86 yo female resident of [facility] admit to [facility] after a fall and found to have severe multifocal pelvic fracture and a retroperitoneal hemorrhage. She is thrombocytopenia.  Pt found to have left sided weakness with progressive dysarthria and left facial droop and concern was present for CVA. CT brain negative.  MRI brain showed right basal ganglia CVA.Marland Kitchen  Pt also with h/o Afib, Bell's palsy *1980- impacting the right side of her face per pt, cervicalgia, recent baker's cyst, low platelets recently,   Pt failed a [facility] swallow screen twice and SLP evaluation was ordered later.  Pt denies dysphagia prior to admission.  Subjective: pt awake in bed Assessment / Plan / Recommendation CHL IP CLINICAL IMPRESSIONS 08/20/2020 Clinical Impression Patient presents with a mild-moderate oropharyngeal dysphagia. Oral phase prolongued with lingual weakness and decreased bolus cohesion, delayed oral transit and premature spillage. Oral phase however  functional with pureed and soft solid boluses. Pharyngeally, patient with consistent delay in swallow initiation which when combined with decreased laryngeal closure results in penetration and/or aspiration of thin liquids, ranging from transient with small boluses, to frank silent aspiration with large consecutive sips via straw. Note that however, with small straw sips and chin tucked, patient able to protect airway consistently. Mild base of tongue/vallecular residuals remain post swallow due to base of tongue weakness which patient clears with eventual second swallow. As patient did require moderate cueing for consistent use of chin tuck, recommend continuation of current diet to mitigate aspiration risks, advancing at bedside following SLP treatment to ensure proper use. Will f/u.  SLP Visit Diagnosis Dysphagia, oropharyngeal phase (R13.12) Attention and concentration deficit following -- Frontal lobe and executive function deficit following -- Impact on safety and function Mild aspiration risk;Moderate aspiration risk   CHL IP TREATMENT RECOMMENDATION 08/20/2020 Treatment Recommendations Therapy as outlined in treatment plan below   Prognosis 08/20/2020 Prognosis for Safe Diet Advancement Good Barriers to Reach Goals -- Barriers/Prognosis Comment -- CHL IP DIET RECOMMENDATION 08/20/2020 SLP Diet Recommendations Dysphagia 3 (Mech soft) solids;Nectar thick liquid Liquid Administration via Cup;Straw Medication Administration Whole meds with puree Compensations Slow rate;Small  sips/bites;Lingual sweep for clearance of pocketing Postural Changes Seated upright at 90 degrees   CHL IP OTHER RECOMMENDATIONS 08/20/2020 Recommended Consults -- Oral Care Recommendations Oral care BID Other Recommendations Order thickener from pharmacy;Prohibited food (jello, ice cream, thin soups);Remove water pitcher   CHL IP FOLLOW UP RECOMMENDATIONS 08/20/2020 Follow up Recommendations Skilled Nursing facility   [facility] IP FREQUENCY AND DURATION 08/20/2020 Speech Therapy Frequency (ACUTE ONLY) min 3x week Treatment Duration 2 weeks      CHL IP ORAL PHASE 08/20/2020 Oral Phase Impaired Oral - Pudding Teaspoon -- Oral - Pudding Cup -- Oral - Honey Teaspoon -- Oral - Honey Cup -- Oral - Nectar Teaspoon Weak lingual manipulation;Reduced posterior propulsion;Lingual/palatal residue;Delayed oral transit;Decreased bolus cohesion;Premature spillage Oral - Nectar Cup Weak lingual manipulation;Reduced posterior propulsion;Lingual/palatal residue;Delayed oral transit;Decreased bolus cohesion;Premature spillage Oral - Nectar Straw -- Oral - Thin Teaspoon Weak lingual manipulation;Reduced posterior propulsion;Lingual/palatal residue;Delayed oral transit;Decreased bolus cohesion;Premature spillage Oral - Thin Cup -- Oral - Thin Straw -- Oral - Puree Weak lingual manipulation;Reduced posterior propulsion;Lingual/palatal residue;Delayed oral transit;Decreased bolus cohesion;Premature spillage Oral - Mech Soft Weak lingual manipulation;Reduced posterior propulsion;Lingual/palatal residue;Delayed oral transit;Decreased bolus cohesion;Premature spillage Oral - Regular -- Oral - Multi-Consistency -- Oral - Pill Weak lingual manipulation;Reduced posterior propulsion;Lingual/palatal residue;Delayed oral transit;Decreased bolus cohesion;Premature spillage Oral Phase - Comment --  CHL IP PHARYNGEAL PHASE 08/20/2020 Pharyngeal Phase Impaired Pharyngeal- Pudding Teaspoon -- Pharyngeal -- Pharyngeal- Pudding Cup -- Pharyngeal --  Pharyngeal- Honey Teaspoon -- Pharyngeal -- Pharyngeal- Honey Cup -- Pharyngeal -- Pharyngeal- Nectar Teaspoon Delayed swallow initiation-pyriform sinuses;Reduced tongue base retraction;Pharyngeal residue - valleculae Pharyngeal -- Pharyngeal- Nectar Cup Delayed swallow initiation-pyriform sinuses;Reduced tongue base retraction;Pharyngeal residue - valleculae Pharyngeal -- Pharyngeal- Nectar Straw -- Pharyngeal -- Pharyngeal- Thin Teaspoon Delayed swallow initiation-pyriform sinuses;Reduced tongue base retraction;Pharyngeal residue - valleculae Pharyngeal -- Pharyngeal- Thin Cup -- Pharyngeal -- Pharyngeal- Thin Straw Delayed swallow initiation-pyriform sinuses;Reduced tongue base retraction;Pharyngeal residue - valleculae;Penetration/Aspiration during swallow;Reduced airway/laryngeal closure Pharyngeal Material enters airway, passes BELOW cords without attempt by patient to eject out (silent aspiration);Material enters airway, remains ABOVE vocal cords and not ejected out;Material enters airway, remains ABOVE vocal cords then ejected out Pharyngeal- Puree Delayed swallow initiation-pyriform sinuses;Reduced tongue base retraction;Pharyngeal residue - valleculae Pharyngeal -- Pharyngeal- Mechanical Soft Delayed swallow initiation-pyriform  sinuses;Reduced tongue base retraction;Pharyngeal residue - valleculae Pharyngeal -- Pharyngeal- Regular -- Pharyngeal -- Pharyngeal- Multi-consistency -- Pharyngeal -- Pharyngeal- Pill -- Pharyngeal -- Pharyngeal Comment --  Gabriel Rainwater MA, CCC-SLP McCoy Leah Meryl 08/20/2020, 12:23 PM              ECHOCARDIOGRAM COMPLETE  Result Date: 08/20/2020    ECHOCARDIOGRAM REPORT   Patient Name:   Susan Conway Date of Exam: 08/20/2020 Medical Rec #:  [id number]   Height:       66.0 in Accession #:    [id number]  Weight:       138.9 lb Date of Birth:  1928/11/09   BSA:          1.713 m Patient Age:    54 years    BP:           141/79 mmHg Patient Gender: F           HR:           98 bpm.  Exam Location:  Inpatient Procedure: 2D Echo, Cardiac Doppler and Color Doppler Indications:    CVA  History:        Patient has prior history of Echocardiogram examinations, most                 recent 04/24/2017. CAD, Prior CABG and Pacemaker, Stroke,                 Arrythmias:Atrial Fibrillation; Risk Factors:Hypertension.  Sonographer:    Dustin Flock Referring Phys: [id number] [place]  1. Left ventricular ejection fraction, by estimation, is 60 to 65%. The left ventricle has normal function. The left ventricle has no regional wall motion abnormalities. There is mild left ventricular hypertrophy. Left ventricular diastolic parameters are indeterminate.  2. Right ventricular systolic function is mildly reduced. The right ventricular size is mildly enlarged. There is severely elevated pulmonary artery systolic pressure. The estimated right ventricular systolic pressure is 65.6 mmHg.  3. Tricuspid valve regurgitation is severe.  4. Biatrial size moderately dilated by visual estimate. Volumetric assessment suboptimal due to image quality.  5. The mitral valve is myxomatous. Mild mitral valve regurgitation. No evidence of mitral stenosis. Borderline bileaflet mitral valve prolapse.  6. The aortic valve is calcified. There is moderate calcification of the aortic valve. Aortic valve regurgitation is not visualized. Aortic valve sclerosis/calcification is present, without any evidence of aortic stenosis.  7. The inferior vena cava is dilated in size with <50% respiratory variability, suggesting right atrial pressure of 15 mmHg. Conclusion(s)/Recommendation(s): No intracardiac source of embolism detected on this transthoracic study. FINDINGS  Left Ventricle: Left ventricular ejection fraction, by estimation, is 60 to 65%. The left ventricle has normal function. The left ventricle has no regional wall motion abnormalities. The left ventricular internal cavity size was normal in size. There is  mild  left ventricular hypertrophy. Left ventricular diastolic parameters are indeterminate. Indeterminate filling pressures. Right Ventricle: The right ventricular size is mildly enlarged. No increase in right ventricular wall thickness. Right ventricular systolic function is mildly reduced. There is severely elevated pulmonary artery systolic pressure. The tricuspid regurgitant velocity is 3.41 m/s, and with an assumed right atrial pressure of 15 mmHg, the estimated right ventricular systolic pressure is 81.2 mmHg. Left Atrium: Left atrial size was moderately dilated. Right Atrium: Right atrial size was moderately dilated. Pericardium: There is no evidence of pericardial effusion. Mitral Valve: The mitral valve is myxomatous. Mild mitral valve regurgitation. No evidence of mitral valve  stenosis. Tricuspid Valve: The tricuspid valve is normal in structure. Tricuspid valve regurgitation is severe. No evidence of tricuspid stenosis. Aortic Valve: The aortic valve is calcified. There is moderate calcification of the aortic valve. Aortic valve regurgitation is not visualized. Mild to moderate aortic valve sclerosis/calcification is present, without any evidence of aortic stenosis. Pulmonic Valve: The pulmonic valve was not well visualized. Pulmonic valve regurgitation is trivial. No evidence of pulmonic stenosis. Aorta: The aortic root is normal in size and structure. Venous: The inferior vena cava is dilated in size with less than 50% respiratory variability, suggesting right atrial pressure of 15 mmHg. IAS/Shunts: No atrial level shunt detected by color flow Doppler. Additional Comments: A is visualized in the right atrium and right ventricle.  LEFT VENTRICLE PLAX 2D LVIDd:         3.00 cm  Diastology LVIDs:         2.10 cm  LV e' medial:    7.29 cm/s LV PW:         1.20 cm  LV E/e' medial:  14.0 LV IVS:        1.00 cm  LV e' lateral:   11.10 cm/s LVOT diam:     2.00 cm  LV E/e' lateral: 9.2 LV SV:         48 LV SV Index:    28 LVOT Area:     3.14 cm  RIGHT VENTRICLE RV Basal diam:  4.00 cm RV S prime:     7.62 cm/s TAPSE (M-mode): 1.7 cm LEFT ATRIUM             Index       RIGHT ATRIUM           Index LA diam:        4.50 cm 2.63 cm/m  RA Area:     25.70 cm LA Vol (A2C):   60.7 ml 35.44 ml/m RA Volume:   76.20 ml  44.49 ml/m LA Vol (A4C):   46.8 ml 27.33 ml/m LA Biplane Vol: 53.3 ml 31.12 ml/m  AORTIC VALVE LVOT Vmax:   92.50 cm/s LVOT Vmean:  64.600 cm/s LVOT VTI:    0.154 m  AORTA Ao Root diam: 3.00 cm MITRAL VALVE                TRICUSPID VALVE MV Area (PHT): 4.06 cm     TR Peak grad:   46.5 mmHg MV Decel Time: 187 msec     TR Vmax:        341.00 cm/s MV E velocity: 102.00 cm/s                             SHUNTS                             Systemic VTI:  0.15 m                             Systemic Diam: 2.00 cm Cherlynn Kaiser MD Electronically signed by Cherlynn Kaiser MD Signature Date/Time: 08/20/2020/9:48:59 AM    Final        Oswald Hillock   [facility] If 7PM-7AM, please contact night-coverage at [email or web address], Office  [phone]   08/21/2020, 3:22 PM  LOS: 4 days

## 2020-08-21 NOTE — Evaluation (Signed)
Physical Therapy Evaluation Patient Details Name: Susan Conway MRN: [id number] DOB: 12-04-1928 Today's Date: 08/21/2020   History of Present Illness  86 year old female with history of atrial fibrillation who was admitted with pelvic and sacral fracture(xray=Pelvic ring fracture comminuted rami on the left with posterior sacral fracture extending into the sacroiliac joint without diastases) with extraperitoneal hemorrhage after patient fell at home.  Orthopedic surgery and trauma surgery were consulted.  Patient also developed new left-sided weakness last night and was seen by tele neurology for code stroke.T head was negative for CVA however MRI brain showed right basal ganglia stroke.  MRA head showed acute infarct in the right basal ganglia extending into overlying corona radiator.  Clinical Impression  Pt admitted with above diagnosis.  Pt is independent at her baseline, resides at [facility]. Pt now requiring incr assist d/t L sided weakness/CVA. Pt is pleasant and very motivated. She is a retired Pharmacist, hospital. dtr present for session--bed level eval today d/t bedrest rec's per ortho MD. Debbora Dus education with dtr   Pt currently with functional limitations due to the deficits listed below (see PT Problem List). Pt will benefit from skilled PT to increase their independence and safety with mobility to allow discharge to the venue listed below.       Follow Up Recommendations SNF;CIR (may prefer SNF at [facility])    Equipment Recommendations  Other (comment) (TBD)    Recommendations for Other Services       Precautions / Restrictions Precautions Precautions: Fall Restrictions Other Position/Activity Restrictions: per ortho gradual incr in activity, BR this wk and can begin transition OOB next week (this would be wk of 10/18), ok to WB per ortho notes as able next wk      Mobility  Bed Mobility Overal bed mobility: Needs Assistance             General bed mobility  comments: pt able to initiate Rolling to L. able to scoot up in supine with RLE, RUE-trapeze and bed in trendelenberg, min assist from PT using Bed pad  Transfers                 General transfer comment: deferred today d/t ortho MD rec's for BR this wk  Ambulation/Gait                Stairs            Wheelchair Mobility    Modified Rankin (Stroke Patients Only) Modified Rankin (Stroke Patients Only) Pre-Morbid Rankin Score: No symptoms Modified Rankin: Severe disability     Balance Overall balance assessment: Needs assistance     Sitting balance - Comments: NT d/t ortho MD rec's for bedrest                                     Pertinent Vitals/Pain Pain Assessment: Faces Faces Pain Scale: Hurts a little bit Pain Location: with L hip IR Pain Descriptors / Indicators: Grimacing;Spasm;Discomfort Pain Intervention(s): Limited activity within patient's tolerance;Monitored during session;Repositioned    Home Living Family/patient expects to be discharged to:: Skilled nursing facility Living Arrangements: Alone               Additional Comments: pt is from [facility], has lived there for 70yrs.    Prior Function Level of Independence: Independent         Comments: does not use AD to amb, Independent amb  Hand Dominance        Extremity/Trunk Assessment   Upper Extremity Assessment Upper Extremity Assessment: Defer to OT evaluation    Lower Extremity Assessment Lower Extremity Assessment: RLE deficits/detail;LLE deficits/detail RLE Deficits / Details: grossly 3/5, hip flexion 2+/5 LLE Deficits / Details: incr flexor tone, hip and knee extension 2/5, ankle 0/5. LLE Sensation: decreased light touch;decreased proprioception LLE Coordination: decreased fine motor;decreased gross motor       Communication   Communication: No difficulties  Cognition Arousal/Alertness: Awake/alert Behavior During Therapy: WFL for  tasks assessed/performed Overall Cognitive Status: Within Functional Limits for tasks assessed                                        General Comments      Exercises General Exercises - Lower Extremity Ankle Circles/Pumps: AROM;Right;10 reps Heel Slides: AROM;AAROM;10 reps;Both Hip ABduction/ADduction: AAROM;Both;5 reps Straight Leg Raises: AAROM;Right;5 reps Other Exercises Other Exercises: instructed dtr in LLE AAROM as well as how to adjust bed ht/proper body mechanics   Assessment/Plan    PT Assessment Patient needs continued PT services  PT Problem List Decreased strength;Decreased range of motion;Decreased activity tolerance;Decreased balance;Decreased knowledge of use of DME;Decreased mobility;Impaired tone       PT Treatment Interventions DME instruction;Therapeutic activities;Functional mobility training;Balance training;Neuromuscular re-education    PT Goals (Current goals can be found in the Care Plan section)  Acute Rehab PT Goals Patient Stated Goal: to get more independent PT Goal Formulation: With patient Time For Goal Achievement: 09/04/20 Potential to Achieve Goals: Good    Frequency Min 4X/week   Barriers to discharge        Co-evaluation               AM-PAC PT "6 Clicks" Mobility  Outcome Measure Help needed turning from your back to your side while in a flat bed without using bedrails?: A Lot Help needed moving from lying on your back to sitting on the side of a flat bed without using bedrails?: Total Help needed moving to and from a bed to a chair (including a wheelchair)?: Total Help needed standing up from a chair using your arms (e.g., wheelchair or bedside chair)?: Total Help needed to walk in hospital room?: Total Help needed climbing 3-5 steps with a railing? : Total 6 Click Score: 7    End of Session   Activity Tolerance: Patient tolerated treatment well;No increased pain Patient left: in bed;with call bell/phone  within reach;with bed alarm set;with family/visitor present   PT Visit Diagnosis: Hemiplegia and hemiparesis Hemiplegia - Right/Left: Left Hemiplegia - dominant/non-dominant: Non-dominant Hemiplegia - caused by: Cerebral infarction    Time: 1005-1037 PT Time Calculation (min) (ACUTE ONLY): 32 min   Charges:   PT Evaluation $PT Eval Moderate Complexity: 1 Mod PT Treatments $Neuromuscular Re-education: 8-22 mins        Baxter Flattery, PT  Acute Rehab Dept ([place]) [phone] Pager [phone]  08/21/2020   [facility] 08/21/2020, 10:59 AM

## 2020-08-22 DIAGNOSIS — S36892A Contusion of other intra-abdominal organs, initial encounter: Secondary | ICD-10-CM | POA: Diagnosis not present

## 2020-08-22 DIAGNOSIS — S32402A Unspecified fracture of left acetabulum, initial encounter for closed fracture: Secondary | ICD-10-CM | POA: Diagnosis not present

## 2020-08-22 DIAGNOSIS — S32502A Unspecified fracture of left pubis, initial encounter for closed fracture: Secondary | ICD-10-CM | POA: Diagnosis not present

## 2020-08-22 MED ORDER — METHOCARBAMOL 500 MG PO TABS
500.0000 mg | ORAL_TABLET | Freq: Four times a day (QID) | ORAL | Status: DC | PRN
  Administered 2020-08-22: 500 mg via ORAL
  Filled 2020-08-22: qty 1

## 2020-08-22 NOTE — TOC Progression Note (Signed)
Transition of Care [facility]) - Progression Note    Patient Details  Name: EZMAE SPEERS MRN: [id number] Date of Birth: 01-30-1929  Transition [facility]) CM/SW Contact  Servando Snare, [place] Phone Number: 08/22/2020, 9:53 AM  Clinical Narrative:   LCSW received call from attending stating patient is ready for DC to SNF. Patient is from [facility]. LCSW keft message for facility liaison to inquire about dc today. Traditionally, facility does not accept weekend admits that where not previously scheduled. TOC will continue to follow for dc needs.     Expected Discharge Plan: [place] Barriers to Discharge: Barriers Unresolved (comment)  Expected Discharge Plan and Services Expected Discharge Plan: [place]   Discharge Planning Services: CM Consult   Living arrangements for the past 2 months: [place]                                       Social Determinants of Health (SDOH) Interventions    Readmission Risk Interventions No flowsheet data found.

## 2020-08-22 NOTE — Progress Notes (Signed)
[facility]  PROGRESS NOTE  Susan Conway OZH:[id number] DOB: September 10, 1929 DOA: 08/17/2020 PCP: [name], DO   Brief HPI:   86 year old female with history of atrial fibrillation who was admitted with pelvic and sacral fracture with extraperitoneal hemorrhage after patient fell at home.  Orthopedic surgery and trauma surgery were consulted.  Patient also developed new left-sided weakness last night and was seen by tele neurology for code stroke.  CT head was negative for stroke.  MRI brain showed right basal ganglia stroke.    Subjective   Patient seen and examined, complains of left hip pain.  She does have left pelvic fracture with associated extraperitoneal hemorrhage no longer ventral abdominal wall and left pelvic sidewall extending into the left paracolic gutter.   Assessment/Plan:     1. Right basal ganglia stroke-CT head was negative for CVA however MRI brain showed right basal ganglia stroke.  MRA head showed acute infarct in the right basal ganglia extending into overlying corona radiator.  Mild edema without substantial mass-effect.  Also shows apparent signal loss involving bilateral proximal right M2 MCA division, concerning for stenosis.  MRA head and neck with contrast ordered after discussion with neurology.  MRA head and neck shows no significant stenotic lesion.  Echocardiogram shows no thrombus.  Patient not on statins due to cramps in her calves.  LDL 81.3.  Hemoglobin A1c is 5.7.  Patient started on aspirin 81 mg daily as per neurology recommendation.  Likely had stroke from atrial fibrillation, she is not on anticoagulation as she had ruptured Baker's cyst and she was taken off anticoagulation by cardiology.  Continue aspirin 81 mg daily.  Likely discharge to skilled nursing facility in 1 to 2 days. 2. Hypertension-blood pressure is elevated, started on metoprolol 2.5 mg IV every 6 hours as needed for BP greater than 180/110 for permissive hypertension.  We will  start home Tenormin at lower dose of 25 mg twice a day. 3. Pelvic fracture-patient has left pelvic fracture patient has sacral hematoma as described above, orthopedic surgery recommending no surgical intervention at this time.  Patient can start working with physical therapy and gradually start bearing weight.   Patient can be started on antithrombotic agents if needed in 5 days as per orthopedics.  Continue pain control with current regimen. 4. Paroxysmal atrial fibrillation-patient has PAF with sinus node dysfunction, s/p PPM.  Patient is not on anticoagulation due to previously ruptured Baker's cyst.  Cardiology had taken her off of anticoagulation.   We will start her on digoxin for tachycardia as now she is on dysphagia 3 diet. 5. Thrombocytopenia-platelet count is 81,000, unclear etiology.  Follow peripheral smear review.  Follow PCP as outpatient.     COVID-19 Labs  No results for input(s): DDIMER, FERRITIN, LDH, CRP in the last 72 hours.  Lab Results  Component Value Date   [place] 08/17/2020   [place] NEGATIVE 05/23/2019     Scheduled medications:   .  stroke: mapping our early stages of recovery book   Does not apply Once  . acetaminophen  1,000 mg Oral Q6H  . aspirin EC  81 mg Oral Daily  . atenolol  25 mg Oral BID  . calcium-vitamin D  1 tablet Oral Daily  . Chlorhexidine Gluconate Cloth  6 each Topical Daily  . digoxin  0.125 mg Oral QODAY  . lidocaine  1 patch Transdermal Q24H  . LORazepam  1 mg Oral QHS  . mupirocin ointment  1 application Nasal BID  .  polyethylene glycol  17 g Oral Daily         CBG: Recent Labs  Lab 08/17/20 2249  GLUCAP 149*    SpO2: 99 % O2 Flow Rate (L/min): 1 L/min    CBC: Recent Labs  Lab 08/17/20 0731 08/17/20 1322 08/18/20 0255  WBC 15.5* 12.7* 15.9*  NEUTROABS 11.9* 11.1*  --   HGB 11.2* 9.7* 9.1*  HCT 35.0* 31.3* 29.2*  MCV 92.3 93.4 94.5  PLT 84* 81* 81*    Basic Metabolic Panel: Recent Labs   Lab 08/17/20 0731 08/18/20 0255  NA 142 137  K 3.7 3.9  CL 105 106  CO2 24 23  GLUCOSE 112* 134*  BUN 24* 21  CREATININE 0.89 1.01*  CALCIUM 9.3 8.7*     Liver Function Tests: Recent Labs  Lab 08/17/20 0731 08/18/20 0255  AST 32 26  ALT 24 21  ALKPHOS 62 53  BILITOT 1.2 1.2  PROT 6.5 6.1*  ALBUMIN 3.7 3.4*     Antibiotics: Anti-infectives (From admission, onward)   None       DVT prophylaxis: SCDs  Code Status: Full code  Family Communication: Discussed with patient's daughter on phone    Status is: Inpatient  Dispo: The patient is from: Home              Anticipated d/c is to: Home              Anticipated d/c date is: 08/21/2020              Patient currently not medically stable for discharge  Barrier to discharge-awaiting PT recommendations  Pressure Injury 08/18/20 Knee Left;Posterior Stage 1 -  Intact skin with non-blanchable redness of a localized area usually over a bony prominence. (Active)  08/18/20 0200  Location: Knee  Location Orientation: Left;Posterior  Staging: Stage 1 -  Intact skin with non-blanchable redness of a localized area usually over a bony prominence.  Wound Description (Comments):   Present on Admission: Yes         Consultants:  Orthopedics  Trauma surgery  Procedures:     Objective   Vitals:   08/21/20 0413 08/21/20 0932 08/21/20 1239 08/22/20 0534  BP: (!) 163/90  (!) 176/82 (!) 179/87  Pulse: (!) 103 99 75 82  Resp: 16  19 18   Temp: 99.1 F (37.3 C)  99.2 F (37.3 C) 97.7 F (36.5 C)  TempSrc: Oral  Oral Oral  SpO2: 97%  97% 99%  Weight:      Height:        Intake/Output Summary (Last 24 hours) at 08/22/2020 1537 Last data filed at 08/22/2020 0900 Gross per 24 hour  Intake 240 ml  Output 500 ml  Net -260 ml    10/15 1901 - 10/17 0700 In: 240 [P.O.:240] Out: 450 [Urine:450]  Filed Weights   08/17/20 0529 08/17/20 1927 08/18/20 0230  Weight: 61.2 kg 61.4 kg 63 kg    Physical  Examination:  General-appears in no acute distress Heart-S1-S2, regular, no murmur auscultated Lungs-clear to auscultation bilaterally, no wheezing or crackles auscultated Abdomen-soft, nontender, no organomegaly Extremities-no edema in the lower extremities, mild tenderness at her left hip joint Neuro-alert, oriented x3, no focal deficit noted  Data Reviewed:   Recent Results (from the past 240 hour(s))  Respiratory Panel by RT PCR (Flu A&B, Covid) - Nasopharyngeal Swab     Status: None   Collection Time: 08/17/20  7:07 AM   Specimen: Nasopharyngeal Swab  Result  Value Ref Range Status   SARS Coronavirus 2 by RT PCR NEGATIVE NEGATIVE Final    Comment: (NOTE) SARS-CoV-2 target nucleic acids are NOT DETECTED.  The SARS-CoV-2 RNA is generally detectable in upper respiratoy specimens during the acute phase of infection. The lowest concentration of SARS-CoV-2 viral copies this assay can detect is 131 copies/mL. A negative result does not preclude SARS-Cov-2 infection and should not be used as the sole basis for treatment or other patient management decisions. A negative result may occur with  improper specimen collection/handling, submission of specimen other than nasopharyngeal swab, presence of viral mutation(s) within the areas targeted by this assay, and inadequate number of viral copies (<131 copies/mL). A negative result must be combined with clinical observations, patient history, and epidemiological information. The expected result is Negative.  Fact Sheet for Patients:  PinkCheek.be  Fact Sheet for Healthcare Providers:  GravelBags.it  This test is no t yet approved or cleared by the [place] FDA and  has been authorized for detection and/or diagnosis of SARS-CoV-2 by FDA under an Emergency Use Authorization (EUA). This EUA will remain  in effect (meaning this test can be used) for the duration of  the COVID-19 declaration under Section 564(b)(1) of the Act, 21 U.S.C. section 360bbb-3(b)(1), unless the authorization is terminated or revoked sooner.     Influenza A by PCR NEGATIVE NEGATIVE Final   Influenza B by PCR NEGATIVE NEGATIVE Final    Comment: (NOTE) The Xpert Xpress SARS-CoV-2/FLU/RSV assay is intended as an aid in  the diagnosis of influenza from Nasopharyngeal swab specimens and  should not be used as a sole basis for treatment. Nasal washings and  aspirates are unacceptable for Xpert Xpress SARS-CoV-2/FLU/RSV  testing.  Fact Sheet for Patients: PinkCheek.be  Fact Sheet for Healthcare Providers: GravelBags.it  This test is not yet approved or cleared by the [place] FDA and  has been authorized for detection and/or diagnosis of SARS-CoV-2 by  FDA under an Emergency Use Authorization (EUA). This EUA will remain  in effect (meaning this test can be used) for the duration of the  Covid-19 declaration under Section 564(b)(1) of the Act, 21  U.S.C. section 360bbb-3(b)(1), unless the authorization is  terminated or revoked. Performed at [facility], [place] [address] [place], [place] [address]   Surgical PCR screen     Status: Abnormal   Collection Time: 08/18/20 12:48 AM   Specimen: Nasal Mucosa; Nasal Swab  Result Value Ref Range Status   MRSA, PCR NEGATIVE NEGATIVE Final   Staphylococcus aureus POSITIVE (A) NEGATIVE Final    Comment: (NOTE) The Xpert SA Assay (FDA approved for NASAL specimens in patients 45 years of age and older), is one component of a comprehensive surveillance program. It is not intended to diagnose infection nor to guide or monitor treatment. Performed at [facility], [place] [address], [place], [place] [address]     No results for input(s): LIPASE, AMYLASE in the last 168 hours. No results for input(s): AMMONIA in the last 168  hours.  Cardiac Enzymes: No results for input(s): CKTOTAL, CKMB, CKMBINDEX, TROPONINI in the last 168 hours. BNP (last 3 results) No results for input(s): BNP in the last 8760 hours.  ProBNP (last 3 results) No results for input(s): PROBNP in the last 8760 hours.  Studies:  No results found.     Oswald Hillock   [facility] If 7PM-7AM, please contact night-coverage at [email or web address], Office  [phone]   08/22/2020, 3:37 PM  LOS: 5 days

## 2020-08-23 DIAGNOSIS — S36892A Contusion of other intra-abdominal organs, initial encounter: Secondary | ICD-10-CM | POA: Diagnosis not present

## 2020-08-23 DIAGNOSIS — S32502A Unspecified fracture of left pubis, initial encounter for closed fracture: Secondary | ICD-10-CM | POA: Diagnosis not present

## 2020-08-23 LAB — CBC
HCT: 24.1 % — ABNORMAL LOW (ref 36.0–46.0)
Hemoglobin: 7.3 g/dL — ABNORMAL LOW (ref 12.0–15.0)
MCH: 29.6 pg (ref 26.0–34.0)
MCHC: 30.3 g/dL (ref 30.0–36.0)
MCV: 97.6 fL (ref 80.0–100.0)
Platelets: 138 10*3/uL — ABNORMAL LOW (ref 150–400)
RBC: 2.47 MIL/uL — ABNORMAL LOW (ref 3.87–5.11)
RDW: 16.4 % — ABNORMAL HIGH (ref 11.5–15.5)
WBC: 16.9 10*3/uL — ABNORMAL HIGH (ref 4.0–10.5)
nRBC: 0.2 % (ref 0.0–0.2)

## 2020-08-23 LAB — RESPIRATORY PANEL BY RT PCR (FLU A&B, COVID)
Influenza A by PCR: NEGATIVE
Influenza B by PCR: NEGATIVE
SARS Coronavirus 2 by RT PCR: NEGATIVE

## 2020-08-23 LAB — HEMOGLOBIN AND HEMATOCRIT, BLOOD
HCT: 27.9 % — ABNORMAL LOW (ref 36.0–46.0)
Hemoglobin: 8.8 g/dL — ABNORMAL LOW (ref 12.0–15.0)

## 2020-08-23 LAB — PREPARE RBC (CROSSMATCH)

## 2020-08-23 MED ORDER — ASPIRIN 81 MG PO TBEC
81.0000 mg | DELAYED_RELEASE_TABLET | Freq: Every day | ORAL | 11 refills | Status: DC
Start: 2020-08-24 — End: 2022-10-02

## 2020-08-23 MED ORDER — ONDANSETRON HCL 4 MG PO TABS: 4.0000 mg | ORAL_TABLET | Freq: Four times a day (QID) | ORAL | 0 refills | Status: DC | PRN

## 2020-08-23 MED ORDER — TRAMADOL HCL 50 MG PO TABS
50.0000 mg | ORAL_TABLET | Freq: Four times a day (QID) | ORAL | 0 refills | Status: DC | PRN
Start: 2020-08-23 — End: 2020-09-08

## 2020-08-23 MED ORDER — SODIUM CHLORIDE 0.9% IV SOLUTION: Freq: Once | INTRAVENOUS | Status: AC

## 2020-08-23 MED ORDER — RESOURCE THICKENUP CLEAR PO POWD: ORAL | 0 refills | Status: DC

## 2020-08-23 MED ORDER — POLYETHYLENE GLYCOL 3350 17 G PO PACK: 17.0000 g | PACK | Freq: Every day | ORAL | 0 refills | Status: DC

## 2020-08-23 MED ORDER — LORAZEPAM 1 MG PO TABS
1.0000 mg | ORAL_TABLET | Freq: Every day | ORAL | 0 refills | Status: DC
Start: 2020-08-23 — End: 2020-09-22

## 2020-08-23 MED ORDER — ATENOLOL 25 MG PO TABS: 25.0000 mg | ORAL_TABLET | Freq: Two times a day (BID) | ORAL | Status: DC

## 2020-08-23 MED ORDER — LIDOCAINE 5 % EX PTCH: 1.0000 | MEDICATED_PATCH | CUTANEOUS | 0 refills | Status: DC

## 2020-08-23 MED ORDER — METHOCARBAMOL 500 MG PO TABS: 500.0000 mg | ORAL_TABLET | Freq: Four times a day (QID) | ORAL | Status: DC | PRN

## 2020-08-23 NOTE — TOC Transition Note (Addendum)
Transition of Care [facility]) - CM/SW Discharge Note   Patient Details  Name: Susan Conway MRN: [id number] Date of Birth: 06-20-1929  [facility]) CM/SW Contact:  Dessa Phi, RN Phone Number: 08/23/2020, 10:21 AM   Clinical Narrative: d/c plan SNF-[facility]-rep [facility] able to accept-nsg call report to [phone]. Awaiting d/c summary, covid results,& blood transfusion. DNR signed. MD/Nsg updated.[facility] already called as a will call(on schedule).        Barriers to Discharge: No Barriers Identified   Patient Goals and CMS Choice Patient states their goals for this hospitalization and ongoing recovery are:: to go back to [facility] CMS Medicare.gov Compare Post Acute Care list provided to:: Patient    Discharge Placement PASRR number recieved: 08/19/20            Patient chooses bed at: Other - please specify in the comment section below: ([facility])        Discharge Plan and Services   Discharge Planning Services: CM Consult                                 Social Determinants of Health (SDOH) Interventions     Readmission Risk Interventions No flowsheet data found.

## 2020-08-23 NOTE — Progress Notes (Signed)
[facility] called for patient's transportation and they stated patient is already on the list for transportation and have about 7 people ahead of patient, patient's daughter's daughter at the bedside updated.

## 2020-08-23 NOTE — Progress Notes (Signed)
Patient was transported by [place] to [facility].

## 2020-08-23 NOTE — Care Management Important Message (Signed)
Important Message  Patient Details IM Letter given to the Patient Name: Susan Conway MRN: [id number] Date of Birth: 1929-03-15   Medicare Important Message Given:  Yes     Kerin Salen 08/23/2020, 11:07 AM

## 2020-08-23 NOTE — Progress Notes (Signed)
Physical Therapy Treatment Patient Details Name: Susan Conway MRN: [id number] DOB: 04/27/1929 Today's Date: 08/23/2020    History of Present Illness 86 year old female with history of atrial fibrillation who was admitted with pelvic and sacral fracture(xray=Pelvic ring fracture comminuted rami on the left with posterior sacral fracture extending into the sacroiliac joint without diastases) with extraperitoneal hemorrhage after patient fell at home.  Orthopedic surgery and trauma surgery were consulted.  Patient also developed new left-sided weakness last night and was seen by tele neurology for code stroke.T head was negative for CVA however MRI brain showed right basal ganglia stroke.  MRA head showed acute infarct in the right basal ganglia extending into overlying corona radiator.    PT Comments    PT initially very sleepy however aroused with multi-modal stimuli. Pleasant and motivated to work with PT. Able to sit EOB for ~ 17 minutes, pain well controlled. See below for details.  Continue to recommend SNF post acute. Encouraged tactile input LUE and attention to left side as much as possible.   Follow Up Recommendations  SNF     Equipment Recommendations  None recommended by PT    Recommendations for Other Services       Precautions / Restrictions Precautions Precautions: Fall Restrictions Weight Bearing Restrictions: No Other Position/Activity Restrictions: per ortho gradual incr in activity, BR this wk and can begin transition OOB next week (this would be wk of 10/18), ok to WB per ortho notes as able next wk    Mobility  Bed Mobility Overal bed mobility: Needs Assistance Bed Mobility: Supine to Sit;Sit to Supine     Supine to sit: Max assist;+2 for physical assistance;+2 for safety/equipment Sit to supine: Max assist;+2 for physical assistance;+2 for safety/equipment   General bed mobility comments: multi-modal cues for technique. assist with bil LEs and for trunk in  both directions  Transfers                    Ambulation/Gait                 Stairs             Wheelchair Mobility    Modified Rankin (Stroke Patients Only)       Balance Overall balance assessment: Needs assistance Sitting-balance support: Single extremity supported;No upper extremity supported;Feet supported Sitting balance-Leahy Scale: Poor Sitting balance - Comments: worked on static and dynamic sitting balance, lateral wt shifting, trunk extension and  maintaining midline x 17 minutes.  Pt requiring varying levels of assist from brief min assist to heavy mod assist  Postural control: Right lateral lean;Posterior lean;Other (comment) (anterior lean--variable )                                  Cognition Arousal/Alertness: Awake/alert Behavior During Therapy: WFL for tasks assessed/performed Overall Cognitive Status: Within Functional Limits for tasks assessed                                 General Comments: sleepy but arouses with multi-modal stimuli      Exercises General Exercises - Lower Extremity Heel Slides: AAROM;PROM;Left;5 reps    General Comments        Pertinent Vitals/Pain Pain Assessment: Faces Faces Pain Scale: Hurts little more Pain Location: L hip Pain Descriptors / Indicators: Grimacing;Discomfort Pain Intervention(s): Limited activity within patient's tolerance;Monitored  during session;Repositioned;Premedicated before session    Home Living                      Prior Function            PT Goals (current goals can now be found in the care plan section) Acute Rehab PT Goals Patient Stated Goal: to get more independent PT Goal Formulation: With patient Time For Goal Achievement: 09/04/20 Potential to Achieve Goals: Good Progress towards PT goals: Progressing toward goals    Frequency    Min 4X/week      PT Plan Current plan remains appropriate    Co-evaluation               AM-PAC PT "6 Clicks" Mobility   Outcome Measure  Help needed turning from your back to your side while in a flat bed without using bedrails?: A Lot Help needed moving from lying on your back to sitting on the side of a flat bed without using bedrails?: A Lot Help needed moving to and from a bed to a chair (including a wheelchair)?: Total Help needed standing up from a chair using your arms (e.g., wheelchair or bedside chair)?: Total Help needed to walk in hospital room?: Total Help needed climbing 3-5 steps with a railing? : Total 6 Click Score: 8    End of Session   Activity Tolerance: Patient tolerated treatment well Patient left: in bed;with bed alarm set;with call bell/phone within reach Nurse Communication: Mobility status PT Visit Diagnosis: Hemiplegia and hemiparesis Hemiplegia - Right/Left: Left Hemiplegia - dominant/non-dominant: Non-dominant Hemiplegia - caused by: Cerebral infarction     Time: 1500-1526 PT Time Calculation (min) (ACUTE ONLY): 26 min  Charges:  $Therapeutic Activity: 8-22 mins $Neuromuscular Re-education: 8-22 mins                     Baxter Flattery, PT  Acute Rehab Dept ([place]) [phone] Pager [phone]  08/23/2020    [facility] 08/23/2020, 4:34 PM

## 2020-08-23 NOTE — Progress Notes (Signed)
Inpatient Rehab Admissions Coordinator Note:   Per therapy recommendations, pt was screened for CIR candidacy by Shann Medal, PT, DPT.  Appears that, given acute CVA, plus pelvic fracture, pt would be an excellent candidate for CIR, though note bed level eval.  If pt/family would like to be considered for CIR, please place an IP Rehab MD Consult.  Please contact me with questions.   Shann Medal, PT, DPT [phone] 08/23/20 10:22 AM

## 2020-08-23 NOTE — Progress Notes (Signed)
Speech Language Pathology Dysphagia Treatment Patient Details Name: Susan Conway MRN: [id number] DOB: Oct 25, 1929 Today's Date: 08/23/2020 Time: 8882-8003 SLP Time Calculation (min) (ACUTE ONLY): 11 min  Assessment / Plan / Recommendation Clinical Impression    Upon entrance into room, pt is lethargic but daughter is present. Daughter advised SLP how pt is tolerating po intake - stating she is having a difficult time with the meats - including meat with spaghetti sauce.  With icee, she demonstrated coughing per daughter thus she ceased feeding her this.   Pt consumed egg salad, talapia without significant difficulties per daughter.  SLP reviewed alternatives for pt to consume that may be easier for her to manage. Reviewed MBS with her daughter Susan Conway, showing her video loops. Advised primary concern was her decreased oral coordination, delay in pharyngeal swallow response and decreased laryngeal elevation causing more difficulties with thin consistencies.  Reviewed chin tuck posture helpful during MBS and option to initiate thin via tsp with chin tuck posture today- daughter Susan Conway declined to start this today saying pt too much going on.  Pt will definitely need follow up SLP at SNF for dysphagia management. She is lethargic today-and needing blood - thus reviewed importance of being fully alert.    Diet Recommendation    dys3/nectar   SLP Plan Continue with current plan of care   Pertinent Vitals/Pain Afebrile, room air   Swallowing Goals   To tolerate po diet with mod cues for precautions.   General Behavior/Cognition: Lethargic/Drowsy Patient Positioning: Upright in bed Oral care provided: N/A HPI: 86 yo female resident of [facility] admit to [facility] after a fall and found to have severe multifocal pelvic fracture and a retroperitoneal hemorrhage. She is thrombocytopenia.  Pt found to have left sided weakness with progressive dysarthria and left facial droop and concern was present for CVA. CT  brain negative.  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  Pt denies dysphagia prior to admission.  Oral Cavity - Oral Hygiene     Dysphagia Treatment Family/Caregiver Educated: daughter Susan Conway Treatment Methods: Patient/caregiver education Patient observed directly with PO's: No Reason PO's not observed: Lethargic   GO    Kathleen Lime, MS [facility] SLP Acute Rehab Services Office [phone] Pager [place], [place] 08/23/2020, 11:42 AM

## 2020-08-23 NOTE — Discharge Summary (Addendum)
Physician Discharge Summary  Susan Conway QQI:[id number] DOB: [birth year] DOA: 08/17/2020  PCP: [name], DO  Admit date: 08/17/2020 Discharge date: 08/23/2020  Time spent: 50 minutes  Recommendations for Outpatient Follow-up:  1. Follow-up stroke clinic in 3 months  2. Check cbc in one week 3. Continue speech therapy at skilled nursing facility   Discharge Diagnoses:  Active Problems:   Pelvic fracture (HCC)   Sacral fracture, closed (HCC)   Pressure injury of skin   Discharge Condition: Stable   Diet recommendation: Dysphagia 3/nectar thick  Filed Weights   08/17/20 0529 08/17/20 1927 08/18/20 0230  Weight: 61.2 kg 61.4 kg 63 kg    History of present illness:  86 year old female with history of atrial fibrillation who was admitted with pelvic and sacral fracture with extraperitoneal hemorrhage after patient fell at home.  Orthopedic surgery and trauma surgery were consulted.  Patient also developed new left-sided weakness last night and was seen by tele neurology for code stroke.  CT head was negative for stroke.  MRI brain showed right basal ganglia stroke.   Hospital Course:   1. Right basal ganglia stroke-CT head was negative for CVA however MRI brain showed right basal ganglia stroke.  MRA head showed acute infarct in the right basal ganglia extending into overlying corona radiator.  Mild edema without substantial mass-effect.  Also shows apparent signal loss involving bilateral proximal right M2 MCA division, concerning for stenosis.  MRA head and neck with contrast ordered after discussion with neurology.  MRA head and neck shows no significant stenotic lesion.  Echocardiogram shows no thrombus.  Patient not on statins due to cramps in her calves.  LDL 81.3.  Hemoglobin A1c is 5.7.  Patient started on aspirin 81 mg daily as per neurology recommendation.  Likely had stroke from atrial fibrillation, she is not on anticoagulation as she had ruptured Baker's cyst and she  was taken off anticoagulation by cardiology.  Continue aspirin 81 mg daily.    2. Hypertension-blood pressure is elevated, she was started on metoprolol 2.5 mg IV every 6 hours as needed for BP greater than 180/110 for permissive hypertension.  At this time she is on  Tenormin at lower dose of 25 mg twice a day.  Dose of Tenormin can be changed to 50 mg twice a day if blood pressure starts to go up.  3. Pelvic fracture-patient has left pelvic fracture patient has sacral hematoma as described above, orthopedic surgery recommended no surgical intervention at this time.  Patient can start working with physical therapy and gradually start bearing weight.    4. Paroxysmal atrial fibrillation-patient has PAF with sinus node dysfunction, s/p PPM.  Patient is not on anticoagulation due to previously ruptured Baker's cyst.  Cardiology had taken her off of anticoagulation.     Continue digoxin, heart rate is controlled.    5. Thrombocytopenia-resolved, unclear etiology.  Platelet count was 81,000.  Currently.  Count has improved to 138,000.  6. Anemia-secondary to blood loss from hematoma.  Hemoglobin dropped to 7.3 this morning.  Will transfuse 1 unit PRBC before discharge.  Will check H&H before discharge, she will need repeat CBC in 1 week.  Procedures:    Consultations:  Orthopedics  Neurology  Discharge Exam: Vitals:   08/23/20 1124 08/23/20 1157  BP: (!) 144/55 (!) 144/70  Pulse: 95 83  Resp: 17 16  Temp: 97.9 F (36.6 C) 98.2 F (36.8 C)  SpO2: 99% 100%    General: Appears in no acute  distress Cardiovascular: S1-S2, regular, no murmur auscultated Respiratory: Clear to auscultation bilaterally  Discharge Instructions   Discharge Instructions    Diet - low sodium heart healthy   Complete by: As directed    Dysphagia 3 diet, nectar thick liquid   Increase activity slowly   Complete by: As directed    No wound care   Complete by: As directed      Allergies as of 08/23/2020       Reactions   Iodinated Diagnostic Agents Nausea Only, Other (See Comments)   Severe nausea and also passed out   Bactrim Rash   Relafen [nabumetone] Rash   Sulfanilamide Rash      Medication List    TAKE these medications   acetaminophen 500 MG tablet Commonly known as: TYLENOL Take 1,000 mg by mouth every 6 (six) hours as needed for mild pain (pain.).   aspirin 81 MG EC tablet Take 1 tablet (81 mg total) by mouth daily. Swallow whole. Start taking on: August 24, 2020   atenolol 25 MG tablet Commonly known as: TENORMIN Take 1 tablet (25 mg total) by mouth 2 (two) times daily. What changed:   medication strength  how much to take   calcium-vitamin D 500-200 MG-UNIT tablet Commonly known as: OSCAL WITH D Take 1 tablet by mouth daily.   digoxin 0.125 MG tablet Commonly known as: LANOXIN TAKE 1 TABLET EVERY OTHER  DAY   lidocaine 5 % Commonly known as: LIDODERM Place 1 patch onto the skin daily. Remove & Discard patch within 12 hours or as directed by MD Start taking on: August 24, 2020   LORazepam 1 MG tablet Commonly known as: ATIVAN Take 1 tablet (1 mg total) by mouth at bedtime. What changed: See the new instructions.   methocarbamol 500 MG tablet Commonly known as: ROBAXIN Take 1 tablet (500 mg total) by mouth every 6 (six) hours as needed for muscle spasms.   ondansetron 4 MG tablet Commonly known as: ZOFRAN Take 1 tablet (4 mg total) by mouth every 6 (six) hours as needed for nausea.   polyethylene glycol 17 g packet Commonly known as: MIRALAX / GLYCOLAX Take 17 g by mouth daily. Start taking on: August 24, 2020   Resource ThickenUp Clear Powd Use as needed.   traMADol 50 MG tablet Commonly known as: ULTRAM Take 1 tablet (50 mg total) by mouth every 6 (six) hours as needed for moderate pain or severe pain.      Allergies  Allergen Reactions  . Iodinated Diagnostic Agents Nausea Only and Other (See Comments)    Severe nausea and also  passed out  . Bactrim Rash  . Relafen [Nabumetone] Rash  . Sulfanilamide Rash      The results of significant diagnostics from this hospitalization (including imaging, microbiology, ancillary and laboratory) are listed below for reference.    Significant Diagnostic Studies: DG Elbow 2 Views Left  Result Date: 08/17/2020 CLINICAL DATA:  Left elbow pain since a fall this morning. Initial encounter. EXAM: LEFT ELBOW - 2 VIEW COMPARISON:  None. FINDINGS: There is no evidence of fracture, dislocation, or joint effusion. There is no evidence of arthropathy or other focal bone abnormality. Soft tissues are unremarkable. IMPRESSION: Negative exam. Electronically Signed   By: Inge Rise M.D.   On: 08/17/2020 15:28   DG Wrist 2 Views Left  Result Date: 08/17/2020 CLINICAL DATA:  Left wrist pain after a fall this morning. EXAM: LEFT WRIST - 2 VIEW COMPARISON:  None. FINDINGS:  There is no evidence of fracture or dislocation. There is no evidence of arthropathy or other focal bone abnormality. Ulnar minus variance noted. Soft tissues are unremarkable. IMPRESSION: No acute abnormality. Electronically Signed   By: Inge Rise M.D.   On: 08/17/2020 15:29   CT PELVIS WO CONTRAST  Result Date: 08/17/2020 CLINICAL DATA:  Fall with left hip pain. EXAM: CT PELVIS WITHOUT CONTRAST TECHNIQUE: Multidetector CT imaging of the pelvis was performed following the standard protocol without intravenous contrast. COMPARISON:  None. FINDINGS: Urinary Tract: Mild bladder wall thickening noted with incomplete bladder distention. Bowel:  Unremarkable visualized pelvic bowel loops. Vascular/Lymphatic: Atherosclerotic calcification noted distal aorta and common iliac arteries. No pelvic sidewall lymphadenopathy. Reproductive:  Unremarkable. Other:  No intraperitoneal free fluid. Musculoskeletal: Comminuted fracture of the left superior and inferior pubic rami. Superior ramus fracture extends into the left pubic bone.  There is extensive extraperitoneal hemorrhage in the left pelvic floor extending into the anterior abdominal wall and displacing the bladder cranially and to the right. There is an associated fracture of the left sacrum extending into the SI joint without diastasis. IMPRESSION: 1. Comminuted fracture of the left superior and inferior pubic rami with extension into the left pubic bone. 2. Associated fracture of the left sacrum extending into the SI joint without diastasis. 3. Extensive extraperitoneal hemorrhage in the left anterior pelvic floor extending into the anterior abdominal wall and displacing the bladder cranially and to the right. 4. Aortic Atherosclerosis (ICD10-I70.0). Electronically Signed   By: Misty Stanley M.D.   On: 08/17/2020 07:12   MR ANGIO HEAD WO CONTRAST  Addendum Date: 08/18/2020   ADDENDUM REPORT: 08/18/2020 15:40 ADDENDUM: Findings discussed with Dr. Darrick Meigs at 3:38 PM. Electronically Signed   By: Margaretha Sheffield MD   On: 08/18/2020 15:40   Result Date: 08/18/2020 CLINICAL DATA:  Atrial fibrillation.  TIA versus stroke. EXAM: MRI HEAD WITHOUT CONTRAST MRA HEAD WITHOUT CONTRAST TECHNIQUE: Multiplanar, multiecho pulse sequences of the brain and surrounding structures were obtained without intravenous contrast. Angiographic images of the head were obtained using MRA technique without contrast. COMPARISON:  Same day CT head FINDINGS: MRI HEAD FINDINGS Brain: There is an acute infarct within the posterior limb of the internal capsule, which involves a portion of the posterior putamen and extends into the overlying right corona radiata. Remote lacunar infarcts involving bilateral cerebellar hemispheres and possibly the left frontal white matter. Mild edema without substantial mass effect. Additional scattered T2/FLAIR hyperintensities throughout the white matter is compatible with chronic microvascular ischemic disease. Mild to moderate diffuse cerebral volume loss with ex vacuo  ventricular dilation. No acute hemorrhage. No hydrocephalus. Skull and upper cervical spine: Normal marrow signal. Sinuses/Orbits: Mild scattered paranasal sinus mucosal thickening. No acute orbital abnormality. Other: No mastoid effusions. MRA HEAD FINDINGS Vascular evaluation is limited by patient motion. Within this limitation: Anterior circulation: There is apparent signal loss involving bilateral proximal right M2 MCA divisions, concerning for stenosis but poorly evaluated on this motion limited exam. Late branching of the left MCA bifurcation. No evidence of significant stenosis proximally involving the left MCA or both ACAs. no evidence of aneurysm on this motionless limited exam. Posterior circulation: Diminutive left V4 vertebral artery, likely secondary to non dominant vertebral artery. Bilateral fetal type PCAs. Likely moderate stenosis of the right proximal P2 PCA. No significant stenosis of the left PCA. No evidence of aneurysm on this motion limited exam. IMPRESSION: 1. Acute infarct in the right basal ganglia, extending into the overlying corona radiata. Mild  edema without substantial mass effect. 2. There is apparent signal loss involving bilateral proximal right M2 MCA divisions, concerning for stenosis but poorly evaluated on this motion limited exam. A CTA could further evaluate and quantify the degree of stenosis if clinically indicated. 3. Bilateral fetal type PCAs with likely moderate stenosis of the right proximal P2 PCA. 4. Diminutive left V4 vertebral artery, likely secondary to non dominant vertebral artery. Dedicated neck CTA or MRA imaging could further evaluate if clinically indicated. 5. Chronic microvascular ischemic disease and remote lacunar infarcts, as detailed above. Electronically Signed: By: Margaretha Sheffield MD On: 08/18/2020 14:54   MR ANGIO NECK W WO CONTRAST  Result Date: 08/19/2020 CLINICAL DATA:  Stroke.  Rule out vertebral artery dissection. EXAM: MRA NECK WITHOUT  AND WITH CONTRAST TECHNIQUE: Multiplanar and multiecho pulse sequences of the neck were obtained without and with intravenous contrast. Angiographic images of the neck were obtained using MRA technique without and with intravenous contrast. CONTRAST:  38mL GADAVIST GADOBUTROL 1 MMOL/ML IV SOLN COMPARISON:  MRI head and MRA head 08/18/2020 FINDINGS: Normal aortic arch.  Proximal great vessels widely patent. Carotid bifurcation widely patent bilaterally without significant stenosis. No irregularity or dissection Right vertebral artery dominant and patent to the basilar without stenosis Multiple areas of irregular stenosis in the mid and distal left vertebral artery. This vessel is small but patent to the basilar. Irregular stenosis most suggestive of atherosclerotic disease rather than dissection. IMPRESSION: No significant carotid stenosis Right vertebral dominant and widely patent Scattered areas of irregular stenosis in the mid and distal left vertebral artery most consistent with atherosclerotic disease. There is moderate stenosis distal left vertebral artery limiting antegrade flow. Electronically Signed   By: Franchot Gallo M.D.   On: 08/19/2020 15:16   MR BRAIN WO CONTRAST  Addendum Date: 08/18/2020   ADDENDUM REPORT: 08/18/2020 15:40 ADDENDUM: Findings discussed with Dr. Darrick Meigs at 3:38 PM. Electronically Signed   By: Margaretha Sheffield MD   On: 08/18/2020 15:40   Result Date: 08/18/2020 CLINICAL DATA:  Atrial fibrillation.  TIA versus stroke. EXAM: MRI HEAD WITHOUT CONTRAST MRA HEAD WITHOUT CONTRAST TECHNIQUE: Multiplanar, multiecho pulse sequences of the brain and surrounding structures were obtained without intravenous contrast. Angiographic images of the head were obtained using MRA technique without contrast. COMPARISON:  Same day CT head FINDINGS: MRI HEAD FINDINGS Brain: There is an acute infarct within the posterior limb of the internal capsule, which involves a portion of the posterior putamen and  extends into the overlying right corona radiata. Remote lacunar infarcts involving bilateral cerebellar hemispheres and possibly the left frontal white matter. Mild edema without substantial mass effect. Additional scattered T2/FLAIR hyperintensities throughout the white matter is compatible with chronic microvascular ischemic disease. Mild to moderate diffuse cerebral volume loss with ex vacuo ventricular dilation. No acute hemorrhage. No hydrocephalus. Skull and upper cervical spine: Normal marrow signal. Sinuses/Orbits: Mild scattered paranasal sinus mucosal thickening. No acute orbital abnormality. Other: No mastoid effusions. MRA HEAD FINDINGS Vascular evaluation is limited by patient motion. Within this limitation: Anterior circulation: There is apparent signal loss involving bilateral proximal right M2 MCA divisions, concerning for stenosis but poorly evaluated on this motion limited exam. Late branching of the left MCA bifurcation. No evidence of significant stenosis proximally involving the left MCA or both ACAs. no evidence of aneurysm on this motionless limited exam. Posterior circulation: Diminutive left V4 vertebral artery, likely secondary to non dominant vertebral artery. Bilateral fetal type PCAs. Likely moderate stenosis of the right proximal  P2 PCA. No significant stenosis of the left PCA. No evidence of aneurysm on this motion limited exam. IMPRESSION: 1. Acute infarct in the right basal ganglia, extending into the overlying corona radiata. Mild edema without substantial mass effect. 2. There is apparent signal loss involving bilateral proximal right M2 MCA divisions, concerning for stenosis but poorly evaluated on this motion limited exam. A CTA could further evaluate and quantify the degree of stenosis if clinically indicated. 3. Bilateral fetal type PCAs with likely moderate stenosis of the right proximal P2 PCA. 4. Diminutive left V4 vertebral artery, likely secondary to non dominant vertebral  artery. Dedicated neck CTA or MRA imaging could further evaluate if clinically indicated. 5. Chronic microvascular ischemic disease and remote lacunar infarcts, as detailed above. Electronically Signed: By: Margaretha Sheffield MD On: 08/18/2020 14:54   MR BRAIN W CONTRAST  Result Date: 08/19/2020 CLINICAL DATA:  Stroke EXAM: MRI HEAD WITH CONTRAST TECHNIQUE: Multiplanar, multiecho pulse sequences of the brain and surrounding structures were obtained with intravenous contrast. CONTRAST:  7mL GADAVIST GADOBUTROL 1 MMOL/ML IV SOLN COMPARISON:  Unenhanced MRI head 08/18/2020 FINDINGS: Postcontrast only images obtained. No abnormal enhancement is identified. No mass lesion. There is generalized atrophy with chronic microvascular ischemic change in the white matter. Acute infarct right basal ganglia corona radiata best seen on diffusion-weighted imaging performed yesterday. IMPRESSION: No pathologic enhancement in the brain. Electronically Signed   By: Franchot Gallo M.D.   On: 08/19/2020 15:19   CT ABDOMEN PELVIS W CONTRAST  Result Date: 08/17/2020 CLINICAL DATA:  Pelvic fractures with hemorrhage EXAM: CT ABDOMEN AND PELVIS WITH CONTRAST TECHNIQUE: Multidetector CT imaging of the abdomen and pelvis was performed using the standard protocol following bolus administration of intravenous contrast. CONTRAST:  132mL OMNIPAQUE IOHEXOL 300 MG/ML  SOLN COMPARISON:  Pelvis CT earlier same day FINDINGS: Lower chest: Mild scarring at the lung bases. Partially imaged cardiomegaly. Hepatobiliary: Small hepatic cysts and other too small to characterize hypodense lesions. No hepatic injury or perihepatic hematoma. Gallbladder is unremarkable. Prominence of the proximal common bile duct with tapering. Pancreas: Unremarkable apart from some atrophy. Spleen: Too small to characterize hypodense lesion. No splenic injury or perisplenic hematoma. Adrenals/Urinary Tract: Bladder is displaced superiorly into the right by hemorrhage.  Integrity of the bladder is not evaluated on this study. Small right renal cysts. Adrenals are unremarkable. Stomach/Bowel: Stomach is within normal limits. Bowel is normal in caliber. Vascular/Lymphatic: Diffuse area iliac atherosclerosis. No enlarged lymph nodes identified. Reproductive: Uterus and bilateral adnexa are unremarkable. Musculoskeletal: Left pelvic and sacral fractures as previously described. Associated extraperitoneal hemorrhage along the ventral abdominal wall and left pelvic sidewall extending into the left paracolic gutter. No substantial change. Chronic appearing severe L1 compression fracture. IMPRESSION: Pelvic and sacral fractures as described previously. Associated extraperitoneal hemorrhage is not substantially changed. No evidence of acute traumatic injury in the abdomen. Electronically Signed   By: Macy Mis M.D.   On: 08/17/2020 13:09   DG CHEST PORT 1 VIEW  Result Date: 08/17/2020 CLINICAL DATA:  Left hip and elbow pain post fall EXAM: PORTABLE CHEST 1 VIEW COMPARISON:  Radiograph 05/26/2019 FINDINGS: Low lung volumes with some atelectatic changes. Chronic biapical pleuroparenchymal scarring and coarsened reticular changes are similar to comparison exams. Postsurgical changes related to prior CABG including intact and aligned sternotomy wires and multiple surgical clips projecting over the mediastinum. Prominent cardiac silhouette likely accentuated by the portable technique with a calcified aorta. Pacer pack overlies the left chest wall with single lead at  the cardiac apex similar to prior. No acute osseous or soft tissue abnormality. Degenerative changes are present in the imaged spine and shoulders. Features of right shoulder calcific tendinosis/hydroxyapatite deposition. IMPRESSION: No acute cardiopulmonary abnormality. No acute traumatic findings in the chest. Aortic Atherosclerosis (ICD10-I70.0). Prior sternotomy and CABG. Well seated pacer lead in stable position.  Electronically Signed   By: Lovena Le M.D.   On: 08/17/2020 15:03   DG Swallowing Func-Speech Pathology  Result Date: 08/20/2020 Objective Swallowing Evaluation: Type of Study: MBS-Modified Barium Swallow Study  Patient Details Name: Susan Conway MRN: [id number] Date of Birth: [birth year] Today's Date: 08/20/2020 Time: SLP Start Time (ACUTE ONLY): 66 -SLP Stop Time (ACUTE ONLY): 1200 SLP Time Calculation (min) (ACUTE ONLY): 30 min Past Medical History: Past Medical History: Diagnosis Date . Actinic keratosis 05/25/2014 . Anxiety  . Atrial fibrillation ([place]) 09/21/2010 . Basal cell carcinoma 05/25/2014  Multiple removed by Dr. Danella Sensing in March 2015: right neck, left neck, scalp  . Bell's palsy 07/18/1979 . Cervicalgia 01/31/2012 . Closed fracture of lumbar vertebra without mention of spinal cord injury 07/17/2005 . Conjunctiva disorder 12/26/2010 . Coronary atherosclerosis of native coronary artery 07/17/1997 . Cramp of limb 08/16/2009 . Degeneration of lumbar or lumbosacral intervertebral disc 01/31/2012 . Disturbance of skin sensation 08/2009 . Dizziness and giddiness 11/08/2011  vertigo . External hemorrhoids without mention of complication 8/84/1660 . External hemorrhoids without mention of complication 63/11/6008 . Ganglion of tendon sheath 08/16/2009 . Leg cramp 10/27/2013  Most frequently the right leg.  . Long term (current) use of anticoagulants 09/2010 . Lumbago 07/2009 . Meralgia paresthetica 07/2007 . MI, old  . Myalgia and myositis, unspecified 01/17/2012 . Osteoporosis  . Other abnormal blood chemistry 04/08/2012 . Other abnormal blood chemistry 2013  hyperglycemia . Other disorder of muscle, ligament, and fascia 04/08/2012 . Other specified cardiac dysrhythmias(427.89) 06/27/2010 . Pain in joint, ankle and foot 10/27/2013  Bilateral since 1998  . Pain in joint, shoulder region 08/12/2012 . Pain in joint, upper arm 12/26/2010 . Pain in limb 01/11/2011 . Pain in thoracic spine 01/31/2012 . Pathologic fracture of  vertebrae 01/22/2012 . PVC's (premature ventricular contractions)  . Rash and other nonspecific skin eruption 08/02/2011 . Senile osteoporosis 07/17/1993 . Unspecified essential hypertension 07/17/1997 . Varicose veins of lower extremities 08/16/2009 . Varicose veins of lower extremities 08/16/2009 Past Surgical History: Past Surgical History: Procedure Laterality Date . COLONOSCOPY WITH PROPOFOL N/A 10/19/2015  Procedure: COLONOSCOPY WITH PROPOFOL;  Surgeon: Gatha Mayer, MD;  Location: [facility];  Service: Endoscopy;  Laterality: N/A; . CORONARY ARTERY BYPASS GRAFT  1998  x2; LIMA to LAD; SVG to diagonal off bypass . HEMORRHOID SURGERY  08/26/2012  Dr. Brantley Stage . LOOP RECORDER INSERTION N/A 05/13/2018  Procedure: LOOP RECORDER INSERTION;  Surgeon: [name], MD;  Location: [facility];  Service: Cardiovascular;  Laterality: N/A; . LOOP RECORDER REMOVAL N/A 05/26/2019  Procedure: LOOP RECORDER REMOVAL;  Surgeon: [name], MD;  Location: [facility];  Service: Cardiovascular;  Laterality: N/A; . MASS EXCISION Left 11/23/2015  Procedure: LEFT WRIST EXCISION CYST;  Surgeon: Leanora Cover, MD;  Location: [facility];  Service: Orthopedics;  Laterality: Left; . PACEMAKER IMPLANT N/A 05/26/2019  Procedure: PACEMAKER IMPLANT;  Surgeon: [name], MD;  Location: [facility];  Service: Cardiovascular;  Laterality: N/A; . SKIN BIOPSY  01/29/14  (R) neck; (R) scalp, 2 (L) neck; shave biopsy Superficial basal cell carcinoma Dr. Danella Sensing . TONSILLECTOMY  1940's  HPI: 86 yo female resident of [facility] admit to [facility] after a fall and found to have severe multifocal pelvic fracture and a retroperitoneal hemorrhage. She is thrombocytopenia.  Pt found to have left sided weakness with progressive dysarthria and left facial droop and concern was present for CVA. CT brain negative.  MRI brain showed right basal ganglia CVA.Marland Kitchen  Pt also with h/o Afib, Bell's palsy *1980- impacting the right  side of her face per pt, cervicalgia, recent baker's cyst, low platelets recently,  Pt failed a [facility] swallow screen twice and SLP evaluation was ordered later.  Pt denies dysphagia prior to admission.  Subjective: pt awake in bed Assessment / Plan / Recommendation [facility] IP CLINICAL IMPRESSIONS 08/20/2020 Clinical Impression Patient presents with a mild-moderate oropharyngeal dysphagia. Oral phase prolongued with lingual weakness and decreased bolus cohesion, delayed oral transit and premature spillage. Oral phase however functional with pureed and soft solid boluses. Pharyngeally, patient with consistent delay in swallow initiation which when combined with decreased laryngeal closure results in penetration and/or aspiration of thin liquids, ranging from transient with small boluses, to frank silent aspiration with large consecutive sips via straw. Note that however, with small straw sips and chin tucked, patient able to protect airway consistently. Mild base of tongue/vallecular residuals remain post swallow due to base of tongue weakness which patient clears with eventual second swallow. As patient did require moderate cueing for consistent use of chin tuck, recommend continuation of current diet to mitigate aspiration risks, advancing at bedside following SLP treatment to ensure proper use. Will f/u.  SLP Visit Diagnosis Dysphagia, oropharyngeal phase (R13.12) Attention and concentration deficit following -- Frontal lobe and executive function deficit following -- Impact on safety and function Mild aspiration risk;Moderate aspiration risk   [facility]L IP TREATMENT RECOMMENDATION 08/20/2020 Treatment Recommendations Therapy as outlined in treatment plan below   Prognosis 08/20/2020 Prognosis for Safe Diet Advancement Good Barriers to Reach Goals -- Barriers/Prognosis Comment -- [facility] IP DIET RECOMMENDATION 08/20/2020 SLP Diet Recommendations Dysphagia 3 (Mech soft) solids;Nectar thick liquid Liquid Administration via  Cup;Straw Medication Administration Whole meds with puree Compensations Slow rate;Small sips/bites;Lingual sweep for clearance of pocketing Postural Changes Seated upright at 90 degrees   CHL IP OTHER RECOMMENDATIONS 08/20/2020 Recommended Consults -- Oral Care Recommendations Oral care BID Other Recommendations Order thickener from pharmacy;Prohibited food (jello, ice cream, thin soups);Remove water pitcher   CH[facility] IP FOLLOW UP RECOMMENDATIONS 08/20/2020 Follow up Recommendations Skilled Nursing facility   [facility] IP FREQUENCY AND DURATION 08/20/2020 Speech Therapy Frequency (ACUTE ONLY) min 3x week Treatment Duration 2 weeks      [facility] IP ORAL PHASE 08/20/2020 Oral Phase Impaired Oral - Pudding Teaspoon -- Oral - Pudding Cup -- Oral - Honey Teaspoon -- Oral - Honey Cup -- Oral - Nectar Teaspoon Weak lingual manipulation;Reduced posterior propulsion;Lingual/palatal residue;Delayed oral transit;Decreased bolus cohesion;Premature spillage Oral - Nectar Cup Weak lingual manipulation;Reduced posterior propulsion;Lingual/palatal residue;Delayed oral transit;Decreased bolus cohesion;Premature spillage Oral - Nectar Straw -- Oral - Thin Teaspoon Weak lingual manipulation;Reduced posterior propulsion;Lingual/palatal residue;Delayed oral transit;Decreased bolus cohesion;Premature spillage Oral - Thin Cup -- Oral - Thin Straw -- Oral - Puree Weak lingual manipulation;Reduced posterior propulsion;Lingual/palatal residue;Delayed oral transit;Decreased bolus cohesion;Premature spillage Oral - Mech Soft Weak lingual manipulation;Reduced posterior propulsion;Lingual/palatal residue;Delayed oral transit;Decreased bolus cohesion;Premature spillage Oral - Regular -- Oral - Multi-Consistency -- Oral - Pill Weak lingual manipulation;Reduced posterior propulsion;Lingual/palatal residue;Delayed oral transit;Decreased bolus cohesion;Premature spillage Oral Phase - Comment --  CHL IP PHARYNGEAL PHASE 08/20/2020 Pharyngeal Phase Impaired  Pharyngeal- Pudding  Teaspoon -- Pharyngeal -- Pharyngeal- Pudding Cup -- Pharyngeal -- Pharyngeal- Honey Teaspoon -- Pharyngeal -- Pharyngeal- Honey Cup -- Pharyngeal -- Pharyngeal- Nectar Teaspoon Delayed swallow initiation-pyriform sinuses;Reduced tongue base retraction;Pharyngeal residue - valleculae Pharyngeal -- Pharyngeal- Nectar Cup Delayed swallow initiation-pyriform sinuses;Reduced tongue base retraction;Pharyngeal residue - valleculae Pharyngeal -- Pharyngeal- Nectar Straw -- Pharyngeal -- Pharyngeal- Thin Teaspoon Delayed swallow initiation-pyriform sinuses;Reduced tongue base retraction;Pharyngeal residue - valleculae Pharyngeal -- Pharyngeal- Thin Cup -- Pharyngeal -- Pharyngeal- Thin Straw Delayed swallow initiation-pyriform sinuses;Reduced tongue base retraction;Pharyngeal residue - valleculae;Penetration/Aspiration during swallow;Reduced airway/laryngeal closure Pharyngeal Material enters airway, passes BELOW cords without attempt by patient to eject out (silent aspiration);Material enters airway, remains ABOVE vocal cords and not ejected out;Material enters airway, remains ABOVE vocal cords then ejected out Pharyngeal- Puree Delayed swallow initiation-pyriform sinuses;Reduced tongue base retraction;Pharyngeal residue - valleculae Pharyngeal -- Pharyngeal- Mechanical Soft Delayed swallow initiation-pyriform sinuses;Reduced tongue base retraction;Pharyngeal residue - valleculae Pharyngeal -- Pharyngeal- Regular -- Pharyngeal -- Pharyngeal- Multi-consistency -- Pharyngeal -- Pharyngeal- Pill -- Pharyngeal -- Pharyngeal Comment --  Gabriel Rainwater MA, CCC-SLP [place] Leah Meryl 08/20/2020, 12:23 PM              ECHOCARDIOGRAM COMPLETE  Result Date: 08/20/2020    ECHOCARDIOGRAM REPORT   Patient Name:   Susan Conway Date of Exam: 08/20/2020 Medical Rec #:  [id number]   Height:       66.0 in Accession #:    [id number]  Weight:       138.9 lb Date of Birth:  [birth year]   BSA:          1.713 m Patient Age:    50  years    BP:           141/79 mmHg Patient Gender: F           HR:           98 bpm. Exam Location:  Inpatient Procedure: 2D Echo, Cardiac Doppler and Color Doppler Indications:    CVA  History:        Patient has prior history of Echocardiogram examinations, most                 recent 04/24/2017. CAD, Prior CABG and Pacemaker, Stroke,                 Arrythmias:Atrial Fibrillation; Risk Factors:Hypertension.  Sonographer:    Dustin Flock Referring Phys: [id number] [place]  1. Left ventricular ejection fraction, by estimation, is 60 to 65%. The left ventricle has normal function. The left ventricle has no regional wall motion abnormalities. There is mild left ventricular hypertrophy. Left ventricular diastolic parameters are indeterminate.  2. Right ventricular systolic function is mildly reduced. The right ventricular size is mildly enlarged. There is severely elevated pulmonary artery systolic pressure. The estimated right ventricular systolic pressure is 79.8 mmHg.  3. Tricuspid valve regurgitation is severe.  4. Biatrial size moderately dilated by visual estimate. Volumetric assessment suboptimal due to image quality.  5. The mitral valve is myxomatous. Mild mitral valve regurgitation. No evidence of mitral stenosis. Borderline bileaflet mitral valve prolapse.  6. The aortic valve is calcified. There is moderate calcification of the aortic valve. Aortic valve regurgitation is not visualized. Aortic valve sclerosis/calcification is present, without any evidence of aortic stenosis.  7. The inferior vena cava is dilated in size with <50% respiratory variability, suggesting right atrial pressure of 15 mmHg. Conclusion(s)/Recommendation(s): No intracardiac source of embolism detected on this transthoracic study. FINDINGS  Left Ventricle: Left ventricular ejection fraction, by estimation, is 60 to 65%. The left ventricle has normal function. The left ventricle has no regional wall motion  abnormalities. The left ventricular internal cavity size was normal in size. There is  mild left ventricular hypertrophy. Left ventricular diastolic parameters are indeterminate. Indeterminate filling pressures. Right Ventricle: The right ventricular size is mildly enlarged. No increase in right ventricular wall thickness. Right ventricular systolic function is mildly reduced. There is severely elevated pulmonary artery systolic pressure. The tricuspid regurgitant velocity is 3.41 m/s, and with an assumed right atrial pressure of 15 mmHg, the estimated right ventricular systolic pressure is 41.2 mmHg. Left Atrium: Left atrial size was moderately dilated. Right Atrium: Right atrial size was moderately dilated. Pericardium: There is no evidence of pericardial effusion. Mitral Valve: The mitral valve is myxomatous. Mild mitral valve regurgitation. No evidence of mitral valve stenosis. Tricuspid Valve: The tricuspid valve is normal in structure. Tricuspid valve regurgitation is severe. No evidence of tricuspid stenosis. Aortic Valve: The aortic valve is calcified. There is moderate calcification of the aortic valve. Aortic valve regurgitation is not visualized. Mild to moderate aortic valve sclerosis/calcification is present, without any evidence of aortic stenosis. Pulmonic Valve: The pulmonic valve was not well visualized. Pulmonic valve regurgitation is trivial. No evidence of pulmonic stenosis. Aorta: The aortic root is normal in size and structure. Venous: The inferior vena cava is dilated in size with less than 50% respiratory variability, suggesting right atrial pressure of 15 mmHg. IAS/Shunts: No atrial level shunt detected by color flow Doppler. Additional Comments: A is visualized in the right atrium and right ventricle.  LEFT VENTRICLE PLAX 2D LVIDd:         3.00 cm  Diastology LVIDs:         2.10 cm  LV e' medial:    7.29 cm/s LV PW:         1.20 cm  LV E/e' medial:  14.0 LV IVS:        1.00 cm  LV e'  lateral:   11.10 cm/s LVOT diam:     2.00 cm  LV E/e' lateral: 9.2 LV SV:         48 LV SV Index:   28 LVOT Area:     3.14 cm  RIGHT VENTRICLE RV Basal diam:  4.00 cm RV S prime:     7.62 cm/s TAPSE (M-mode): 1.7 cm LEFT ATRIUM             Index       RIGHT ATRIUM           Index LA diam:        4.50 cm 2.63 cm/m  RA Area:     25.70 cm LA Vol (A2C):   60.7 ml 35.44 ml/m RA Volume:   76.20 ml  44.49 ml/m LA Vol (A4C):   46.8 ml 27.33 ml/m LA Biplane Vol: 53.3 ml 31.12 ml/m  AORTIC VALVE LVOT Vmax:   92.50 cm/s LVOT Vmean:  64.600 cm/s LVOT VTI:    0.154 m  AORTA Ao Root diam: 3.00 cm MITRAL VALVE                TRICUSPID VALVE MV Area (PHT): 4.06 cm     TR Peak grad:   46.5 mmHg MV Decel Time: 187 msec     TR Vmax:        341.00 cm/s MV E velocity: 102.00 cm/s  SHUNTS                             Systemic VTI:  0.15 m                             Systemic Diam: 2.00 cm Cherlynn Kaiser MD Electronically signed by Cherlynn Kaiser MD Signature Date/Time: 08/20/2020/9:48:59 AM    Final    CT HEAD CODE STROKE WO CONTRAST  Result Date: 08/17/2020 CLINICAL DATA:  Code stroke.  Left-sided facial droop EXAM: CT HEAD WITHOUT CONTRAST TECHNIQUE: Contiguous axial images were obtained from the base of the skull through the vertex without intravenous contrast. COMPARISON:  None. FINDINGS: Brain: There is no mass, hemorrhage or extra-axial collection. There is generalized atrophy without lobar predilection. There is hypoattenuation of the periventricular white matter, most commonly indicating chronic ischemic microangiopathy. Vascular: No abnormal hyperdensity of the major intracranial arteries or dural venous sinuses. No intracranial atherosclerosis. Skull: The visualized skull base, calvarium and extracranial soft tissues are normal. Sinuses/Orbits: No fluid levels or advanced mucosal thickening of the visualized paranasal sinuses. No mastoid or middle ear effusion. The orbits are normal.  ASPECTS [facility] Stroke Program Early CT Score) - Ganglionic level infarction (caudate, lentiform nuclei, internal capsule, insula, M1-M3 cortex): 7 - Supraganglionic infarction (M4-M6 cortex): 3 Total score (0-10 with 10 being normal): 10 IMPRESSION: 1. No acute intracranial abnormality. 2. ASPECTS is 10. 3. Severe chronic ischemic changes of the white matter. These results were called by telephone at the time of interpretation on 08/17/2020 at 11:36 pm to provider Lang Snow , who verbally acknowledged these results. Electronically Signed   By: Ulyses Jarred M.D.   On: 08/17/2020 23:37   VAS US CAROTID  Result Date: 08/18/2020 Carotid Arterial Duplex Study Indications:       Stroke, new left-sided weakness. Comparison Study:  No prior studies. Performing Technologist: Darlin Coco  Examination Guidelines: A complete evaluation includes B-mode imaging, spectral Doppler, color Doppler, and power Doppler as needed of all accessible portions of each vessel. Bilateral testing is considered an integral part of a complete examination. Limited examinations for reoccurring indications may be performed as noted.  Right Carotid Findings: +----------+--------+--------+--------+------------------+------------------+           PSV cm/sEDV cm/sStenosisPlaque DescriptionComments           +----------+--------+--------+--------+------------------+------------------+ CCA Prox  53      11                                intimal thickening +----------+--------+--------+--------+------------------+------------------+ CCA Distal56      12              heterogenous                         +----------+--------+--------+--------+------------------+------------------+ ICA Prox  49      17      1-39%   heterogenous                         +----------+--------+--------+--------+------------------+------------------+ ICA Distal85      27                                                    +----------+--------+--------+--------+------------------+------------------+  ECA       64                                                           +----------+--------+--------+--------+------------------+------------------+ +----------+--------+-------+----------------+-------------------+           PSV cm/sEDV cmsDescribe        Arm Pressure (mmHG) +----------+--------+-------+----------------+-------------------+ [id number]             Multiphasic, WNL                    +----------+--------+-------+----------------+-------------------+ +---------+--------+--+--------+--+---------+ VertebralPSV cm/s60EDV cm/s26Antegrade +---------+--------+--+--------+--+---------+  Left Carotid Findings: +----------+--------+--------+--------+------------------+------------------+           PSV cm/sEDV cm/sStenosisPlaque DescriptionComments           +----------+--------+--------+--------+------------------+------------------+ CCA Prox  77      17                                intimal thickening +----------+--------+--------+--------+------------------+------------------+ CCA Distal85      23              calcific                             +----------+--------+--------+--------+------------------+------------------+ ICA Prox  74      21      1-39%   heterogenous                         +----------+--------+--------+--------+------------------+------------------+ ICA Distal92      27                                tortuous           +----------+--------+--------+--------+------------------+------------------+ ECA       82                                                           +----------+--------+--------+--------+------------------+------------------+ +----------+--------+--------+----------------+-------------------+           PSV cm/sEDV cm/sDescribe        Arm Pressure (mmHG) +----------+--------+--------+----------------+-------------------+  [id number]             Multiphasic, WNL                    +----------+--------+--------+----------------+-------------------+ +---------+--------+--+--------+--+---------+ VertebralPSV cm/s75EDV cm/s12Antegrade +---------+--------+--+--------+--+---------+   Summary: Right Carotid: Velocities in the right ICA are consistent with a 1-39% stenosis. Left Carotid: Velocities in the left ICA are consistent with a 1-39% stenosis. Vertebrals:  Bilateral vertebral arteries demonstrate antegrade flow. Subclavians: Normal flow hemodynamics were seen in bilateral subclavian              arteries. *See table(s) above for measurements and observations.  Electronically signed by Antony Contras MD on 08/18/2020 at 10:49:24 AM.    Final     Microbiology: Recent Results (from the past 240 hour(s))  Respiratory Panel by RT PCR (Flu A&B, Covid) - Nasopharyngeal Swab     Status: None  Collection Time: 08/17/20  7:07 AM   Specimen: Nasopharyngeal Swab  Result Value Ref Range Status   SARS Coronavirus 2 by RT PCR NEGATIVE NEGATIVE Final    Comment: (NOTE) SARS-CoV-2 target nucleic acids are NOT DETECTED.  The SARS-CoV-2 RNA is generally detectable in upper respiratoy specimens during the acute phase of infection. The lowest concentration of SARS-CoV-2 viral copies this assay can detect is 131 copies/mL. A negative result does not preclude SARS-Cov-2 infection and should not be used as the sole basis for treatment or other patient management decisions. A negative result may occur with  improper specimen collection/handling, submission of specimen other than nasopharyngeal swab, presence of viral mutation(s) within the areas targeted by this assay, and inadequate number of viral copies (<131 copies/mL). A negative result must be combined with clinical observations, patient history, and epidemiological information. The expected result is Negative.  Fact Sheet for Patients:   PinkCheek.be  Fact Sheet for Healthcare Providers:  GravelBags.it  This test is no t yet approved or cleared by the [place] FDA and  has been authorized for detection and/or diagnosis of SARS-CoV-2 by FDA under an Emergency Use Authorization (EUA). This EUA will remain  in effect (meaning this test can be used) for the duration of the COVID-19 declaration under Section 564(b)(1) of the Act, 21 U.S.C. section 360bbb-3(b)(1), unless the authorization is terminated or revoked sooner.     Influenza A by PCR NEGATIVE NEGATIVE Final   Influenza B by PCR NEGATIVE NEGATIVE Final    Comment: (NOTE) The Xpert Xpress SARS-CoV-2/FLU/RSV assay is intended as an aid in  the diagnosis of influenza from Nasopharyngeal swab specimens and  should not be used as a sole basis for treatment. Nasal washings and  aspirates are unacceptable for Xpert Xpress SARS-CoV-2/FLU/RSV  testing.  Fact Sheet for Patients: PinkCheek.be  Fact Sheet for Healthcare Providers: GravelBags.it  This test is not yet approved or cleared by the [place] FDA and  has been authorized for detection and/or diagnosis of SARS-CoV-2 by  FDA under an Emergency Use Authorization (EUA). This EUA will remain  in effect (meaning this test can be used) for the duration of the  Covid-19 declaration under Section 564(b)(1) of the Act, 21  U.S.C. section 360bbb-3(b)(1), unless the authorization is  terminated or revoked. Performed at [facility], [place] [address], [place], [place] [address]   Surgical PCR screen     Status: Abnormal   Collection Time: 08/18/20 12:48 AM   Specimen: Nasal Mucosa; Nasal Swab  Result Value Ref Range Status   MRSA, PCR NEGATIVE NEGATIVE Final   Staphylococcus aureus POSITIVE (A) NEGATIVE Final    Comment: (NOTE) The Xpert SA Assay (FDA approved for NASAL  specimens in patients 54 years of age and older), is one component of a comprehensive surveillance program. It is not intended to diagnose infection nor to guide or monitor treatment. Performed at [facility], [place] [address] [place], [place] [address]   Respiratory Panel by RT PCR (Flu A&B, Covid) - Nasopharyngeal Swab     Status: None   Collection Time: 08/23/20 10:59 AM   Specimen: Nasopharyngeal Swab  Result Value Ref Range Status   SARS Coronavirus 2 by RT PCR NEGATIVE NEGATIVE Final    Comment: (NOTE) SARS-CoV-2 target nucleic acids are NOT DETECTED.  The SARS-CoV-2 RNA is generally detectable in upper respiratoy specimens during the acute phase of infection. The lowest concentration of SARS-CoV-2 viral copies this assay can detect is  131 copies/mL. A negative result does not preclude SARS-Cov-2 infection and should not be used as the sole basis for treatment or other patient management decisions. A negative result may occur with  improper specimen collection/handling, submission of specimen other than nasopharyngeal swab, presence of viral mutation(s) within the areas targeted by this assay, and inadequate number of viral copies (<131 copies/mL). A negative result must be combined with clinical observations, patient history, and epidemiological information. The expected result is Negative.  Fact Sheet for Patients:  PinkCheek.be  Fact Sheet for Healthcare Providers:  GravelBags.it  This test is no t yet approved or cleared by the [place] FDA and  has been authorized for detection and/or diagnosis of SARS-CoV-2 by FDA under an Emergency Use Authorization (EUA). This EUA will remain  in effect (meaning this test can be used) for the duration of the COVID-19 declaration under Section 564(b)(1) of the Act, 21 U.S.C. section 360bbb-3(b)(1), unless the authorization is terminated or revoked  sooner.     Influenza A by PCR NEGATIVE NEGATIVE Final   Influenza B by PCR NEGATIVE NEGATIVE Final    Comment: (NOTE) The Xpert Xpress SARS-CoV-2/FLU/RSV assay is intended as an aid in  the diagnosis of influenza from Nasopharyngeal swab specimens and  should not be used as a sole basis for treatment. Nasal washings and  aspirates are unacceptable for Xpert Xpress SARS-CoV-2/FLU/RSV  testing.  Fact Sheet for Patients: PinkCheek.be  Fact Sheet for Healthcare Providers: GravelBags.it  This test is not yet approved or cleared by the [place] FDA and  has been authorized for detection and/or diagnosis of SARS-CoV-2 by  FDA under an Emergency Use Authorization (EUA). This EUA will remain  in effect (meaning this test can be used) for the duration of the  Covid-19 declaration under Section 564(b)(1) of the Act, 21  U.S.C. section 360bbb-3(b)(1), unless the authorization is  terminated or revoked. Performed at [facility], [place] [address], [place], [place] [address]      Labs: Basic Metabolic Panel: Recent Labs  Lab 08/17/20 0731 08/18/20 0255  NA 142 137  K 3.7 3.9  CL 105 106  CO2 24 23  GLUCOSE 112* 134*  BUN 24* 21  CREATININE 0.89 1.01*  CALCIUM 9.3 8.7*   Liver Function Tests: Recent Labs  Lab 08/17/20 0731 08/18/20 0255  AST 32 26  ALT 24 21  ALKPHOS 62 53  BILITOT 1.2 1.2  PROT 6.5 6.1*  ALBUMIN 3.7 3.4*   No results for input(s): LIPASE, AMYLASE in the last 168 hours. No results for input(s): AMMONIA in the last 168 hours. CBC: Recent Labs  Lab 08/17/20 0731 08/17/20 1322 08/18/20 0255 08/23/20 0521  WBC 15.5* 12.7* 15.9* 16.9*  NEUTROABS 11.9* 11.1*  --   --   HGB 11.2* 9.7* 9.1* 7.3*  HCT 35.0* 31.3* 29.2* 24.1*  MCV 92.3 93.4 94.5 97.6  PLT 84* 81* 81* 138*    CBG: Recent Labs  Lab 08/17/20 2249  GLUCAP 149*       Signed:  Oswald Hillock MD.   [facility] 08/23/2020, 1:58 PM

## 2020-08-24 ENCOUNTER — Non-Acute Institutional Stay (SKILLED_NURSING_FACILITY): Payer: Medicare Other | Admitting: Internal Medicine

## 2020-08-24 DIAGNOSIS — I6389 Other cerebral infarction: Secondary | ICD-10-CM | POA: Diagnosis not present

## 2020-08-24 DIAGNOSIS — I69322 Dysarthria following cerebral infarction: Secondary | ICD-10-CM | POA: Diagnosis not present

## 2020-08-24 DIAGNOSIS — M62562 Muscle wasting and atrophy, not elsewhere classified, left lower leg: Secondary | ICD-10-CM | POA: Diagnosis not present

## 2020-08-24 DIAGNOSIS — D62 Acute posthemorrhagic anemia: Secondary | ICD-10-CM

## 2020-08-24 DIAGNOSIS — I4891 Unspecified atrial fibrillation: Secondary | ICD-10-CM

## 2020-08-24 DIAGNOSIS — Z9181 History of falling: Secondary | ICD-10-CM | POA: Diagnosis not present

## 2020-08-24 DIAGNOSIS — I2510 Atherosclerotic heart disease of native coronary artery without angina pectoris: Secondary | ICD-10-CM

## 2020-08-24 DIAGNOSIS — I69391 Dysphagia following cerebral infarction: Secondary | ICD-10-CM | POA: Diagnosis not present

## 2020-08-24 DIAGNOSIS — S32402A Unspecified fracture of left acetabulum, initial encounter for closed fracture: Secondary | ICD-10-CM | POA: Diagnosis not present

## 2020-08-24 DIAGNOSIS — R2689 Other abnormalities of gait and mobility: Secondary | ICD-10-CM | POA: Diagnosis not present

## 2020-08-24 DIAGNOSIS — D696 Thrombocytopenia, unspecified: Secondary | ICD-10-CM

## 2020-08-24 DIAGNOSIS — S36892A Contusion of other intra-abdominal organs, initial encounter: Secondary | ICD-10-CM | POA: Diagnosis not present

## 2020-08-24 DIAGNOSIS — I69354 Hemiplegia and hemiparesis following cerebral infarction affecting left non-dominant side: Secondary | ICD-10-CM

## 2020-08-24 DIAGNOSIS — D6869 Other thrombophilia: Secondary | ICD-10-CM

## 2020-08-24 DIAGNOSIS — I4821 Permanent atrial fibrillation: Secondary | ICD-10-CM | POA: Diagnosis not present

## 2020-08-24 DIAGNOSIS — M84350S Stress fracture, pelvis, sequela: Secondary | ICD-10-CM | POA: Diagnosis not present

## 2020-08-24 DIAGNOSIS — M6389 Disorders of muscle in diseases classified elsewhere, multiple sites: Secondary | ICD-10-CM | POA: Diagnosis not present

## 2020-08-24 DIAGNOSIS — I10 Essential (primary) hypertension: Secondary | ICD-10-CM

## 2020-08-24 DIAGNOSIS — Z66 Do not resuscitate: Secondary | ICD-10-CM

## 2020-08-24 DIAGNOSIS — S3210XA Unspecified fracture of sacrum, initial encounter for closed fracture: Secondary | ICD-10-CM

## 2020-08-24 DIAGNOSIS — R278 Other lack of coordination: Secondary | ICD-10-CM | POA: Diagnosis not present

## 2020-08-24 LAB — TYPE AND SCREEN
ABO/RH(D): O NEG
Antibody Screen: NEGATIVE
Unit division: 0

## 2020-08-24 LAB — BPAM RBC
Blood Product Expiration Date: 202111072359
ISSUE DATE / TIME: 202110181137
Unit Type and Rh: 9500

## 2020-08-24 NOTE — Progress Notes (Signed)
Provider:  Rexene Edison. Mariea Clonts, D.O., C.M.D. Location:  Artist of Service:  SNF (31)  PCP: [name], DO Patient Care Team: [name], DO as PCP - General (Geriatric Medicine) [name], MD as PCP - Electrophysiology (Cardiology) [name], MD as Consulting Physician (Dermatology) [name], MD as Consulting Physician (Cardiology) [facility] [name], MD as Consulting Physician (Dermatology)  Extended Emergency Contact Information Primary Emergency Contact: [name] of [place] Phone: [phone] Mobile Phone: [phone] Relation: Daughter Secondary Emergency Contact: [name],[name]  [name] of [place] Phone: [phone] Relation: Son  Code Status: DNR Goals of Care: Advanced Directive information Advanced Directives 08/17/2020  Does Patient Have a Medical Advance Directive? Yes  Type of Advance Directive Out of facility DNR (pink MOST or yellow form)  Does patient want to make changes to medical advance directive? -  Copy of [place] in Chart? -  Pre-existing out of facility DNR order (yellow form or pink MOST form) Yellow form placed in chart (order not valid for inpatient use)   Chief Complaint  Patient presents with  . New Admit To SNF    new admit to rehab s/p hospitalization with pelvic fxs and stroke    HPI: Patient is a 86 y.o. female with h/o CAD, afib, prior left baker's cyst, thrombocytopenia, prior GI bleed on warfarin, considerable epistaxis on both pradaxa and ruptured baker's cyst with hemorrhage while on eliquis so they were stopped, hyperlipidemia, hyperglycemia, cervicalgia, and others seen today for admission to [facility] s/p hospitalization for fall with pelvic fractures, retroperitoneal bleed, and subsequent stroke (right basal ganglia) while hospitalized.  She has left flaccid hemiparesis with 0/5 LUE grip and  minimal movement of her left food.  She's on a dysphagia 3 nectar thick diet with aspiration precautions.  She was started on 81mg  asa daily.  She's been permitted to run higher bp temporarily.  Her hgb was 7.3 right before discharge so she was given a unit of pRBCs and will need her cbc in a week.  She is now on baby asa daily.    When seen, she was alert and oriented and her daughter was present and plans to stay with her as much as she can.  She was quite concerned, curious and taking many notes.  She asked to see her mom's CT of the pelvis and was asking about her left leg turning out.  She also notes her mom is not as pale as she'd been.  Mrs. Wos has a great attitude, was her usual pleasant and appropriate self.  She's made some great strides already per her daughter with clearer speech (only minimally slurred now), doing ok on her modified diet and has some slight movement of the left leg against resistance when it is picked up.  Her discomfort appears to be primarily in her buttocks and she kept adjusting herself, but she feels like the medication is helping and does not like to take more pills than necessary.  Therapy had come in to work with her and returned when I finished my eval.  Nursing has been putting a lidoderm patch on her left hip and groin to help her pain.  Pt is massaging her left hand and trying to keep it elevated to prevent further swellling.  She has been moving her bowels.    BPs reviewed and remain in the 160s and her HR is at times just over  100.    Past Medical History:  Diagnosis Date  . Actinic keratosis 05/25/2014  . Anxiety   . Atrial fibrillation ([place]) 09/21/2010  . Basal cell carcinoma 05/25/2014   Multiple removed by Dr. Danella Sensing in March 2015: right neck, left neck, scalp   . Bell's palsy 07/18/1979  . Cervicalgia 01/31/2012  . Closed fracture of lumbar vertebra without mention of spinal cord injury 07/17/2005  . Conjunctiva disorder 12/26/2010  . Coronary  atherosclerosis of native coronary artery 07/17/1997  . Cramp of limb 08/16/2009  . Degeneration of lumbar or lumbosacral intervertebral disc 01/31/2012  . Disturbance of skin sensation 08/2009  . Dizziness and giddiness 11/08/2011   vertigo  . External hemorrhoids without mention of complication 01/19/1760  . External hemorrhoids without mention of complication 60/73/7106  . Ganglion of tendon sheath 08/16/2009  . Leg cramp 10/27/2013   Most frequently the right leg.   . Long term (current) use of anticoagulants 09/2010  . Lumbago 07/2009  . Meralgia paresthetica 07/2007  . MI, old   . Myalgia and myositis, unspecified 01/17/2012  . Osteoporosis   . Other abnormal blood chemistry 04/08/2012  . Other abnormal blood chemistry 2013   hyperglycemia  . Other disorder of muscle, ligament, and fascia 04/08/2012  . Other specified cardiac dysrhythmias(427.89) 06/27/2010  . Pain in joint, ankle and foot 10/27/2013   Bilateral since 1998   . Pain in joint, shoulder region 08/12/2012  . Pain in joint, upper arm 12/26/2010  . Pain in limb 01/11/2011  . Pain in thoracic spine 01/31/2012  . 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 PVC's (premature ventricular contractions)   . Rash and other nonspecific skin eruption 08/02/2011  . Senile osteoporosis 07/17/1993  . Unspecified essential hypertension 07/17/1997  . Varicose veins of lower extremities 08/16/2009  . Varicose veins of lower extremities 08/16/2009   Past Surgical History:  Procedure Laterality Date  . COLONOSCOPY WITH PROPOFOL N/A 10/19/2015   Procedure: COLONOSCOPY WITH PROPOFOL;  Surgeon: Gatha Mayer, MD;  Location: [facility];  Service: Endoscopy;  Laterality: N/A;  . CORONARY ARTERY BYPASS GRAFT  1998   x2; LIMA to LAD; SVG to diagonal off bypass  . HEMORRHOID SURGERY  08/26/2012   Dr. Brantley Stage  . LOOP RECORDER INSERTION N/A 05/13/2018   Procedure: LOOP RECORDER INSERTION;  Surgeon: [name], MD;  Location: [facility];   Service: Cardiovascular;  Laterality: N/A;  . LOOP RECORDER REMOVAL N/A 05/26/2019   Procedure: LOOP RECORDER REMOVAL;  Surgeon: [name], MD;  Location: [facility];  Service: Cardiovascular;  Laterality: N/A;  . MASS EXCISION Left 11/23/2015   Procedure: LEFT WRIST EXCISION CYST;  Surgeon: Leanora Cover, MD;  Location: [facility];  Service: Orthopedics;  Laterality: Left;  . PACEMAKER IMPLANT N/A 05/26/2019   Procedure: PACEMAKER IMPLANT;  Surgeon: [name], MD;  Location: [facility];  Service: Cardiovascular;  Laterality: N/A;  . SKIN BIOPSY  01/29/14   (R) neck; (R) scalp, 2 (L) neck; shave biopsy Superficial basal cell carcinoma Dr. Danella Sensing  . TONSILLECTOMY  1940's    Social History   Socioeconomic History  . Marital status: Widowed    Spouse name: Not on file  . Number of children: Not on file  . Years of education: Not on file  . Highest education level: Not on file  Occupational History  . Occupation: Retired Pharmacist, hospital  Tobacco Use  . Smoking status: Never Smoker  .  Smokeless tobacco: Never Used  Vaping Use  . Vaping Use: Never used  Substance and Sexual Activity  . Alcohol use: Yes    Comment: occasional wine  . Drug use: No  . Sexual activity: Never  Other Topics Concern  . Not on file  Social History Narrative   [facility]   Widowed 09/03/2016   Never smoked   Alcohol occasional wine   Exercise, house work    DNR , POA, Living Will   Social Determinants of Health   Financial Resource Strain:   . Difficulty of Paying Living Expenses: Not on file  Food Insecurity:   . Worried About Charity fundraiser in the Last Year: Not on file  . Ran Out of Food in the Last Year: Not on file  Transportation Needs:   . Lack of Transportation (Medical): Not on file  . Lack of Transportation (Non-Medical): Not on file  Physical Activity:   . Days of Exercise per Week: Not on file  . Minutes of Exercise per  Session: Not on file  Stress:   . Feeling of Stress : Not on file  Social Connections:   . Frequency of Communication with Friends and Family: Not on file  . Frequency of Social Gatherings with Friends and Family: Not on file  . Attends Religious Services: Not on file  . Active Member of Clubs or Organizations: Not on file  . Attends Archivist Meetings: Not on file  . Marital Status: Not on file    reports that she has never smoked. She has never used smokeless tobacco. She reports current alcohol use. She reports that she does not use drugs.  Functional Status Survey:    Family History  Problem Relation Age of Onset  . Heart attack Father 88  . Heart disease Father   . Hypertension Sister   . Heart disease Sister   . Heart disease Brother     Health Maintenance  Topic Date Due  . TETANUS/TDAP  10/06/2017  . INFLUENZA VACCINE  06/06/2020  . DEXA SCAN  Completed  . COVID-19 Vaccine  Completed  . PNA vac Low Risk Adult  Completed    Allergies  Allergen Reactions  . Iodinated Diagnostic Agents Nausea Only and Other (See Comments)    Severe nausea and also passed out  . Bactrim Rash  . Relafen [Nabumetone] Rash  . Sulfanilamide Rash    Outpatient Encounter Medications as of 08/24/2020  Medication Sig  . acetaminophen (TYLENOL) 500 MG tablet Take 1,000 mg by mouth every 6 (six) hours as needed for mild pain (pain.).   Marland Kitchen aspirin EC 81 MG EC tablet Take 1 tablet (81 mg total) by mouth daily. Swallow whole.  Marland Kitchen atenolol (TENORMIN) 25 MG tablet Take 1 tablet (25 mg total) by mouth 2 (two) times daily.  . calcium-vitamin D (OSCAL WITH D) 500-200 MG-UNIT tablet Take 1 tablet by mouth daily.  . digoxin (LANOXIN) 0.125 MG tablet TAKE 1 TABLET EVERY OTHER  DAY (Patient taking differently: Take 0.125 mg by mouth every other day. )  . lidocaine (LIDODERM) 5 % Place 1 patch onto the skin daily. Remove & Discard patch within 12 hours or as directed by MD  . LORazepam  (ATIVAN) 1 MG tablet Take 1 tablet (1 mg total) by mouth at bedtime.  . Maltodextrin-Xanthan Gum (RESOURCE THICKENUP CLEAR) POWD Use as needed.  . methocarbamol (ROBAXIN) 500 MG tablet Take 1 tablet (500 mg total) by mouth  every 6 (six) hours as needed for muscle spasms.  . ondansetron (ZOFRAN) 4 MG tablet Take 1 tablet (4 mg total) by mouth every 6 (six) hours as needed for nausea.  . polyethylene glycol (MIRALAX / GLYCOLAX) 17 g packet Take 17 g by mouth daily.  . traMADol (ULTRAM) 50 MG tablet Take 1 tablet (50 mg total) by mouth every 6 (six) hours as needed for moderate pain or severe pain.   No facility-administered encounter medications on file as of 08/24/2020.    Review of Systems  Constitutional: Negative for chills, fever and malaise/fatigue.  HENT: Positive for hearing loss. Negative for congestion and sore throat.   Eyes: Negative for blurred vision.  Respiratory: Negative for cough and shortness of breath.   Cardiovascular: Negative for chest pain, palpitations and leg swelling.  Gastrointestinal: Negative for abdominal pain, blood in stool, constipation, diarrhea, heartburn and melena.  Genitourinary: Negative for dysuria.  Musculoskeletal: Positive for falls, joint pain and myalgias.       Left groin pain and buttock pain  Skin: Negative for itching and rash.  Neurological: Positive for speech change and focal weakness. Negative for dizziness, tingling, sensory change, loss of consciousness and headaches.  Endo/Heme/Allergies: Bruises/bleeds easily.  Psychiatric/Behavioral: Negative for depression and memory loss. The patient is nervous/anxious and has insomnia.        Takes ativan at bedtime with benefit    Vitals:   08/26/20 0614  BP: (!) 165/80  Pulse: 95  Resp: 16  Temp: 98 F (36.7 C)  SpO2: 95%  Weight: 138 lb 14.2 oz (63 kg)   Body mass index is 22.42 kg/m. Physical Exam Vitals reviewed.  Constitutional:      General: She is not in acute distress.     Appearance: Normal appearance. She is not toxic-appearing.  HENT:     Head: Normocephalic and atraumatic.     Right Ear: External ear normal.     Left Ear: External ear normal.     Nose: Nose normal.     Mouth/Throat:     Pharynx: Oropharynx is clear.  Eyes:     Conjunctiva/sclera: Conjunctivae normal.     Pupils: Pupils are equal, round, and reactive to light.  Cardiovascular:     Rate and Rhythm: Tachycardia present. Rhythm irregular.     Heart sounds: No murmur heard.   Pulmonary:     Effort: Pulmonary effort is normal.     Breath sounds: Normal breath sounds. No wheezing, rhonchi or rales.  Abdominal:     General: Bowel sounds are normal.     Palpations: Abdomen is soft.     Tenderness: There is no abdominal tenderness. There is no guarding or rebound.  Musculoskeletal:     Cervical back: Neck supple.     Right lower leg: No edema.     Left lower leg: No edema.     Comments: Mild edema of left forearm and hand  Lymphadenopathy:     Cervical: No cervical adenopathy.  Skin:    General: Skin is warm and dry.     Findings: Bruising present.  Neurological:     Mental Status: She is alert and oriented to person, place, and time.     Cranial Nerves: Cranial nerve deficit present.     Motor: Weakness present.     Gait: Gait abnormal.     Comments: Left flaccid hemiparesis, dysarthria, appears facial droop has resolved, right tongue deviation; 0/5 LUE, minimal resistance with foot when left knee flexed,  primarily just spastic movements which have become less uncomfortable and frequent per pt; R UE and RLE all 5/5  Psychiatric:        Mood and Affect: Mood normal.        Behavior: Behavior normal.        Thought Content: Thought content normal.        Judgment: Judgment normal.     Labs reviewed: Basic Metabolic Panel: Recent Labs    03/04/20 0955 07/12/20 0000 07/22/20 0000 08/17/20 0731 08/18/20 0255  NA   < >  --  142 142 137  K   < >  --  4.1 3.7 3.9  CL   < >   --  104 105 106  CO2   < >  --  29* 24 23  GLUCOSE  --   --   --  112* 134*  BUN   < >  --  26* 24* 21  CREATININE   < >  --  1.0 0.89 1.01*  CALCIUM  --  9.8  --  9.3 8.7*   < > = values in this interval not displayed.   Liver Function Tests: Recent Labs    03/04/20 0000 03/04/20 0955 08/17/20 0731 08/18/20 0255  AST  --  20 32 26  ALT  --  15 24 21   ALKPHOS  --   --  62 53  BILITOT  --   --  1.2 1.2  PROT  --   --  6.5 6.1*  ALBUMIN 3.4*  --  3.7 3.4*   No results for input(s): LIPASE, AMYLASE in the last 8760 hours. No results for input(s): AMMONIA in the last 8760 hours. CBC: Recent Labs    08/17/20 0731 08/17/20 0731 08/17/20 1322 08/17/20 1322 08/18/20 0255 08/23/20 0521 08/23/20 1621  WBC 15.5*   < > 12.7*  --  15.9* 16.9*  --   NEUTROABS 11.9*  --  11.1*  --   --   --   --   HGB 11.2*   < > 9.7*   < > 9.1* 7.3* 8.8*  HCT 35.0*   < > 31.3*   < > 29.2* 24.1* 27.9*  MCV 92.3   < > 93.4  --  94.5 97.6  --   PLT 84*   < > 81*  --  81* 138*  --    < > = values in this interval not displayed.   Cardiac Enzymes: No results for input(s): CKTOTAL, CKMB, CKMBINDEX, TROPONINI in the last 8760 hours. BNP: Invalid input(s): POCBNP Lab Results  Component Value Date   HGBA1C 5.7 (H) 08/19/2020   Lab Results  Component Value Date   TSH 3.80 09/14/2015   No results found for: VITAMINB12 No results found for: FOLATE No results found for: IRON, TIBC, FERRITIN  Imaging and Procedures obtained prior to SNF admission: DG Elbow 2 Views Left  Result Date: 08/17/2020 CLINICAL DATA:  Left elbow pain since a fall this morning. Initial encounter. EXAM: LEFT ELBOW - 2 VIEW COMPARISON:  None. FINDINGS: There is no evidence of fracture, dislocation, or joint effusion. There is no evidence of arthropathy or other focal bone abnormality. Soft tissues are unremarkable. IMPRESSION: Negative exam. Electronically Signed   By: Inge Rise M.D.   On: 08/17/2020 15:28   DG Wrist 2  Views Left  Result Date: 08/17/2020 CLINICAL DATA:  Left wrist pain after a fall this morning. EXAM: LEFT WRIST - 2 VIEW COMPARISON:  None.  FINDINGS: There is no evidence of fracture or dislocation. There is no evidence of arthropathy or other focal bone abnormality. Ulnar minus variance noted. Soft tissues are unremarkable. IMPRESSION: No acute abnormality. Electronically Signed   By: Inge Rise M.D.   On: 08/17/2020 15:29   CT PELVIS WO CONTRAST  Result Date: 08/17/2020 CLINICAL DATA:  Fall with left hip pain. EXAM: CT PELVIS WITHOUT CONTRAST TECHNIQUE: Multidetector CT imaging of the pelvis was performed following the standard protocol without intravenous contrast. COMPARISON:  None. FINDINGS: Urinary Tract: Mild bladder wall thickening noted with incomplete bladder distention. Bowel:  Unremarkable visualized pelvic bowel loops. Vascular/Lymphatic: Atherosclerotic calcification noted distal aorta and common iliac arteries. No pelvic sidewall lymphadenopathy. Reproductive:  Unremarkable. Other:  No intraperitoneal free fluid. Musculoskeletal: Comminuted fracture of the left superior and inferior pubic rami. Superior ramus fracture extends into the left pubic bone. There is extensive extraperitoneal hemorrhage in the left pelvic floor extending into the anterior abdominal wall and displacing the bladder cranially and to the right. There is an associated fracture of the left sacrum extending into the SI joint without diastasis. IMPRESSION: 1. Comminuted fracture of the left superior and inferior pubic rami with extension into the left pubic bone. 2. Associated fracture of the left sacrum extending into the SI joint without diastasis. 3. Extensive extraperitoneal hemorrhage in the left anterior pelvic floor extending into the anterior abdominal wall and displacing the bladder cranially and to the right. 4. Aortic Atherosclerosis (ICD10-I70.0). Electronically Signed   By: Misty Stanley M.D.   On:  08/17/2020 07:12   MR ANGIO HEAD WO CONTRAST  Addendum Date: 08/18/2020   ADDENDUM REPORT: 08/18/2020 15:40 ADDENDUM: Findings discussed with Dr. Darrick Meigs at 3:38 PM. Electronically Signed   By: Margaretha Sheffield MD   On: 08/18/2020 15:40   Result Date: 08/18/2020 CLINICAL DATA:  Atrial fibrillation.  TIA versus stroke. EXAM: MRI HEAD WITHOUT CONTRAST MRA HEAD WITHOUT CONTRAST TECHNIQUE: Multiplanar, multiecho pulse sequences of the brain and surrounding structures were obtained without intravenous contrast. Angiographic images of the head were obtained using MRA technique without contrast. COMPARISON:  Same day CT head FINDINGS: MRI HEAD FINDINGS Brain: There is an acute infarct within the posterior limb of the internal capsule, which involves a portion of the posterior putamen and extends into the overlying right corona radiata. Remote lacunar infarcts involving bilateral cerebellar hemispheres and possibly the left frontal white matter. Mild edema without substantial mass effect. Additional scattered T2/FLAIR hyperintensities throughout the white matter is compatible with chronic microvascular ischemic disease. Mild to moderate diffuse cerebral volume loss with ex vacuo ventricular dilation. No acute hemorrhage. No hydrocephalus. Skull and upper cervical spine: Normal marrow signal. Sinuses/Orbits: Mild scattered paranasal sinus mucosal thickening. No acute orbital abnormality. Other: No mastoid effusions. MRA HEAD FINDINGS Vascular evaluation is limited by patient motion. Within this limitation: Anterior circulation: There is apparent signal loss involving bilateral proximal right M2 MCA divisions, concerning for stenosis but poorly evaluated on this motion limited exam. Late branching of the left MCA bifurcation. No evidence of significant stenosis proximally involving the left MCA or both ACAs. no evidence of aneurysm on this motionless limited exam. Posterior circulation: Diminutive left V4 vertebral  artery, likely secondary to non dominant vertebral artery. Bilateral fetal type PCAs. Likely moderate stenosis of the right proximal P2 PCA. No significant stenosis of the left PCA. No evidence of aneurysm on this motion limited exam. IMPRESSION: 1. Acute infarct in the right basal ganglia, extending into the overlying corona radiata.  Mild edema without substantial mass effect. 2. There is apparent signal loss involving bilateral proximal right M2 MCA divisions, concerning for stenosis but poorly evaluated on this motion limited exam. A CTA could further evaluate and quantify the degree of stenosis if clinically indicated. 3. Bilateral fetal type PCAs with likely moderate stenosis of the right proximal P2 PCA. 4. Diminutive left V4 vertebral artery, likely secondary to non dominant vertebral artery. Dedicated neck CTA or MRA imaging could further evaluate if clinically indicated. 5. Chronic microvascular ischemic disease and remote lacunar infarcts, as detailed above. Electronically Signed: By: Margaretha Sheffield MD On: 08/18/2020 14:54   MR BRAIN WO CONTRAST  Addendum Date: 08/18/2020   ADDENDUM REPORT: 08/18/2020 15:40 ADDENDUM: Findings discussed with Dr. Darrick Meigs at 3:38 PM. Electronically Signed   By: Margaretha Sheffield MD   On: 08/18/2020 15:40   Result Date: 08/18/2020 CLINICAL DATA:  Atrial fibrillation.  TIA versus stroke. EXAM: MRI HEAD WITHOUT CONTRAST MRA HEAD WITHOUT CONTRAST TECHNIQUE: Multiplanar, multiecho pulse sequences of the brain and surrounding structures were obtained without intravenous contrast. Angiographic images of the head were obtained using MRA technique without contrast. COMPARISON:  Same day CT head FINDINGS: MRI HEAD FINDINGS Brain: There is an acute infarct within the posterior limb of the internal capsule, which involves a portion of the posterior putamen and extends into the overlying right corona radiata. Remote lacunar infarcts involving bilateral cerebellar hemispheres and  possibly the left frontal white matter. Mild edema without substantial mass effect. Additional scattered T2/FLAIR hyperintensities throughout the white matter is compatible with chronic microvascular ischemic disease. Mild to moderate diffuse cerebral volume loss with ex vacuo ventricular dilation. No acute hemorrhage. No hydrocephalus. Skull and upper cervical spine: Normal marrow signal. Sinuses/Orbits: Mild scattered paranasal sinus mucosal thickening. No acute orbital abnormality. Other: No mastoid effusions. MRA HEAD FINDINGS Vascular evaluation is limited by patient motion. Within this limitation: Anterior circulation: There is apparent signal loss involving bilateral proximal right M2 MCA divisions, concerning for stenosis but poorly evaluated on this motion limited exam. Late branching of the left MCA bifurcation. No evidence of significant stenosis proximally involving the left MCA or both ACAs. no evidence of aneurysm on this motionless limited exam. Posterior circulation: Diminutive left V4 vertebral artery, likely secondary to non dominant vertebral artery. Bilateral fetal type PCAs. Likely moderate stenosis of the right proximal P2 PCA. No significant stenosis of the left PCA. No evidence of aneurysm on this motion limited exam. IMPRESSION: 1. Acute infarct in the right basal ganglia, extending into the overlying corona radiata. Mild edema without substantial mass effect. 2. There is apparent signal loss involving bilateral proximal right M2 MCA divisions, concerning for stenosis but poorly evaluated on this motion limited exam. A CTA could further evaluate and quantify the degree of stenosis if clinically indicated. 3. Bilateral fetal type PCAs with likely moderate stenosis of the right proximal P2 PCA. 4. Diminutive left V4 vertebral artery, likely secondary to non dominant vertebral artery. Dedicated neck CTA or MRA imaging could further evaluate if clinically indicated. 5. Chronic microvascular  ischemic disease and remote lacunar infarcts, as detailed above. Electronically Signed: By: Margaretha Sheffield MD On: 08/18/2020 14:54   CT ABDOMEN PELVIS W CONTRAST  Result Date: 08/17/2020 CLINICAL DATA:  Pelvic fractures with hemorrhage EXAM: CT ABDOMEN AND PELVIS WITH CONTRAST TECHNIQUE: Multidetector CT imaging of the abdomen and pelvis was performed using the standard protocol following bolus administration of intravenous contrast. CONTRAST:  131mL OMNIPAQUE IOHEXOL 300 MG/ML  SOLN COMPARISON:  Pelvis CT earlier same day FINDINGS: Lower chest: Mild scarring at the lung bases. Partially imaged cardiomegaly. Hepatobiliary: Small hepatic cysts and other too small to characterize hypodense lesions. No hepatic injury or perihepatic hematoma. Gallbladder is unremarkable. Prominence of the proximal common bile duct with tapering. Pancreas: Unremarkable apart from some atrophy. Spleen: Too small to characterize hypodense lesion. No splenic injury or perisplenic hematoma. Adrenals/Urinary Tract: Bladder is displaced superiorly into the right by hemorrhage. Integrity of the bladder is not evaluated on this study. Small right renal cysts. Adrenals are unremarkable. Stomach/Bowel: Stomach is within normal limits. Bowel is normal in caliber. Vascular/Lymphatic: Diffuse area iliac atherosclerosis. No enlarged lymph nodes identified. Reproductive: Uterus and bilateral adnexa are unremarkable. Musculoskeletal: Left pelvic and sacral fractures as previously described. Associated extraperitoneal hemorrhage along the ventral abdominal wall and left pelvic sidewall extending into the left paracolic gutter. No substantial change. Chronic appearing severe L1 compression fracture. IMPRESSION: Pelvic and sacral fractures as described previously. Associated extraperitoneal hemorrhage is not substantially changed. No evidence of acute traumatic injury in the abdomen. Electronically Signed   By: Macy Mis M.D.   On: 08/17/2020  13:09   DG CHEST PORT 1 VIEW  Result Date: 08/17/2020 CLINICAL DATA:  Left hip and elbow pain post fall EXAM: PORTABLE CHEST 1 VIEW COMPARISON:  Radiograph 05/26/2019 FINDINGS: Low lung volumes with some atelectatic changes. Chronic biapical pleuroparenchymal scarring and coarsened reticular changes are similar to comparison exams. Postsurgical changes related to prior CABG including intact and aligned sternotomy wires and multiple surgical clips projecting over the mediastinum. Prominent cardiac silhouette likely accentuated by the portable technique with a calcified aorta. Pacer pack overlies the left chest wall with single lead at the cardiac apex similar to prior. No acute osseous or soft tissue abnormality. Degenerative changes are present in the imaged spine and shoulders. Features of right shoulder calcific tendinosis/hydroxyapatite deposition. IMPRESSION: No acute cardiopulmonary abnormality. No acute traumatic findings in the chest. Aortic Atherosclerosis (ICD10-I70.0). Prior sternotomy and CABG. Well seated pacer lead in stable position. Electronically Signed   By: Lovena Le M.D.   On: 08/17/2020 15:03   CT HEAD CODE STROKE WO CONTRAST  Result Date: 08/17/2020 CLINICAL DATA:  Code stroke.  Left-sided facial droop EXAM: CT HEAD WITHOUT CONTRAST TECHNIQUE: Contiguous axial images were obtained from the base of the skull through the vertex without intravenous contrast. COMPARISON:  None. FINDINGS: Brain: There is no mass, hemorrhage or extra-axial collection. There is generalized atrophy without lobar predilection. There is hypoattenuation of the periventricular white matter, most commonly indicating chronic ischemic microangiopathy. Vascular: No abnormal hyperdensity of the major intracranial arteries or dural venous sinuses. No intracranial atherosclerosis. Skull: The visualized skull base, calvarium and extracranial soft tissues are normal. Sinuses/Orbits: No fluid levels or advanced mucosal  thickening of the visualized paranasal sinuses. No mastoid or middle ear effusion. The orbits are normal. ASPECTS [facility] Stroke Program Early CT Score) - Ganglionic level infarction (caudate, lentiform nuclei, internal capsule, insula, M1-M3 cortex): 7 - Supraganglionic infarction (M4-M6 cortex): 3 Total score (0-10 with 10 being normal): 10 IMPRESSION: 1. No acute intracranial abnormality. 2. ASPECTS is 10. 3. Severe chronic ischemic changes of the white matter. These results were called by telephone at the time of interpretation on 08/17/2020 at 11:36 pm to provider Lang Snow , who verbally acknowledged these results. Electronically Signed   By: Ulyses Jarred M.D.   On: 08/17/2020 23:37   VAS US CAROTID  Result Date: 08/18/2020 Carotid Arterial Duplex Study Indications:  Stroke, new left-sided weakness. Comparison Study:  No prior studies. Performing Technologist: Darlin Coco  Examination Guidelines: A complete evaluation includes B-mode imaging, spectral Doppler, color Doppler, and power Doppler as needed of all accessible portions of each vessel. Bilateral testing is considered an integral part of a complete examination. Limited examinations for reoccurring indications may be performed as noted.  Right Carotid Findings: +----------+--------+--------+--------+------------------+------------------+           PSV cm/sEDV cm/sStenosisPlaque DescriptionComments           +----------+--------+--------+--------+------------------+------------------+ CCA Prox  53      11                                intimal thickening +----------+--------+--------+--------+------------------+------------------+ CCA Distal56      12              heterogenous                         +----------+--------+--------+--------+------------------+------------------+ ICA Prox  49      17      1-39%   heterogenous                          +----------+--------+--------+--------+------------------+------------------+ ICA Distal85      27                                                   +----------+--------+--------+--------+------------------+------------------+ ECA       64                                                           +----------+--------+--------+--------+------------------+------------------+ +----------+--------+-------+----------------+-------------------+           PSV cm/sEDV cmsDescribe        Arm Pressure (mmHG) +----------+--------+-------+----------------+-------------------+ [id number]             Multiphasic, WNL                    +----------+--------+-------+----------------+-------------------+ +---------+--------+--+--------+--+---------+ VertebralPSV cm/s60EDV cm/s26Antegrade +---------+--------+--+--------+--+---------+  Left Carotid Findings: +----------+--------+--------+--------+------------------+------------------+           PSV cm/sEDV cm/sStenosisPlaque DescriptionComments           +----------+--------+--------+--------+------------------+------------------+ CCA Prox  77      17                                intimal thickening +----------+--------+--------+--------+------------------+------------------+ CCA Distal85      23              calcific                             +----------+--------+--------+--------+------------------+------------------+ ICA Prox  74      21      1-39%   heterogenous                         +----------+--------+--------+--------+------------------+------------------+ ICA Distal92      27  tortuous           +----------+--------+--------+--------+------------------+------------------+ ECA       82                                                           +----------+--------+--------+--------+------------------+------------------+  +----------+--------+--------+----------------+-------------------+           PSV cm/sEDV cm/sDescribe        Arm Pressure (mmHG) +----------+--------+--------+----------------+-------------------+ [id number]             Multiphasic, WNL                    +----------+--------+--------+----------------+-------------------+ +---------+--------+--+--------+--+---------+ VertebralPSV cm/s75EDV cm/s12Antegrade +---------+--------+--+--------+--+---------+   Summary: Right Carotid: Velocities in the right ICA are consistent with a 1-39% stenosis. Left Carotid: Velocities in the left ICA are consistent with a 1-39% stenosis. Vertebrals:  Bilateral vertebral arteries demonstrate antegrade flow. Subclavians: Normal flow hemodynamics were seen in bilateral subclavian              arteries. *See table(s) above for measurements and observations.  Electronically signed by Antony Contras MD on 08/18/2020 at 10:49:24 AM.    Final     Assessment/Plan 1. Closed nondisplaced fracture of left acetabulum, unspecified portion of acetabulum, initial encounter ([facility]) -cont current pain regimen which has been effective and working with PT, OT  2. Closed fracture of sacrum, unspecified portion of sacrum, initial encounter ([place]) -cont same pain regimen above and therapy  3. 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 Acute blood loss anemia -f/u cbc, had a transfusion at the hospital when hgb less than 8  6. Traumatic retroperitoneal hematoma, initial encounter -from pelvic fxs -is back on a baby asa only, monitor cbc  7. Permanent atrial fibrillation ([facility]) -cont  atenolol bid and dig qod -has pacemaker, as well  8. Hypercoagulable state due to atrial fibrillation ([place]) -on baby asa only, could not tolerate warfarin or NOACs for various reasons in hpi  9. Thrombocytopenia ([facility]) -mild, cont to monitor, had peripheral smear in hospital  10. Essential hypertension -allowing permissive htn at first, but if bp creaping up over 180s, we'll need to adjust atenolol  11. Coronary artery disease involving native coronary artery of native heart without angina pectoris -not on statin due to her leg cramps and her decision -cont baby asa, bp control  12. DNR (do not resuscitate) -DNR, has a MOST for limited additional interventions, abx if indicated, IVFs for defined time, no feeding tube that we completed 10/08/18  Family/ staff Communication: d/w pt's daughter in room, rehab nurse  Labs/tests ordered:  F/u cbc, bmp  Paislee Szatkowski L. Anjanae Woehrle, D.O. [facility] [address], [place] [address] Cell Phone (Mon-Fri 8am-5pm):  [phone] On Call:  [phone] & follow prompts after 5pm & weekends Office Phone:  [phone] Office Fax:  [phone]

## 2020-08-25 DIAGNOSIS — M6389 Disorders of muscle in diseases classified elsewhere, multiple sites: Secondary | ICD-10-CM | POA: Diagnosis not present

## 2020-08-25 DIAGNOSIS — R278 Other lack of coordination: Secondary | ICD-10-CM | POA: Diagnosis not present

## 2020-08-25 DIAGNOSIS — R2689 Other abnormalities of gait and mobility: Secondary | ICD-10-CM | POA: Diagnosis not present

## 2020-08-25 DIAGNOSIS — I6389 Other cerebral infarction: Secondary | ICD-10-CM | POA: Diagnosis not present

## 2020-08-25 DIAGNOSIS — I69391 Dysphagia following cerebral infarction: Secondary | ICD-10-CM | POA: Diagnosis not present

## 2020-08-25 DIAGNOSIS — M62562 Muscle wasting and atrophy, not elsewhere classified, left lower leg: Secondary | ICD-10-CM | POA: Diagnosis not present

## 2020-08-26 ENCOUNTER — Encounter: Payer: Self-pay | Admitting: Internal Medicine

## 2020-08-26 ENCOUNTER — Ambulatory Visit: Payer: Medicare Other

## 2020-08-26 DIAGNOSIS — R2689 Other abnormalities of gait and mobility: Secondary | ICD-10-CM | POA: Diagnosis not present

## 2020-08-26 DIAGNOSIS — D696 Thrombocytopenia, unspecified: Secondary | ICD-10-CM | POA: Insufficient documentation

## 2020-08-26 DIAGNOSIS — I69391 Dysphagia following cerebral infarction: Secondary | ICD-10-CM | POA: Diagnosis not present

## 2020-08-26 DIAGNOSIS — Z66 Do not resuscitate: Secondary | ICD-10-CM | POA: Insufficient documentation

## 2020-08-26 DIAGNOSIS — D62 Acute posthemorrhagic anemia: Secondary | ICD-10-CM | POA: Insufficient documentation

## 2020-08-26 DIAGNOSIS — M6389 Disorders of muscle in diseases classified elsewhere, multiple sites: Secondary | ICD-10-CM | POA: Diagnosis not present

## 2020-08-26 DIAGNOSIS — R278 Other lack of coordination: Secondary | ICD-10-CM | POA: Diagnosis not present

## 2020-08-26 DIAGNOSIS — M62562 Muscle wasting and atrophy, not elsewhere classified, left lower leg: Secondary | ICD-10-CM | POA: Diagnosis not present

## 2020-08-26 DIAGNOSIS — S36892A Contusion of other intra-abdominal organs, initial encounter: Secondary | ICD-10-CM | POA: Insufficient documentation

## 2020-08-26 DIAGNOSIS — I6389 Other cerebral infarction: Secondary | ICD-10-CM | POA: Diagnosis not present

## 2020-08-26 DIAGNOSIS — I69322 Dysarthria following cerebral infarction: Secondary | ICD-10-CM | POA: Insufficient documentation

## 2020-08-26 DIAGNOSIS — I69354 Hemiplegia and hemiparesis following cerebral infarction affecting left non-dominant side: Secondary | ICD-10-CM | POA: Insufficient documentation

## 2020-08-26 MED ORDER — DIGOXIN 125 MCG PO TABS: 125.0000 ug | ORAL_TABLET | ORAL | 2 refills | Status: DC

## 2020-08-27 ENCOUNTER — Telehealth: Payer: Self-pay | Admitting: Internal Medicine

## 2020-08-27 DIAGNOSIS — R278 Other lack of coordination: Secondary | ICD-10-CM | POA: Diagnosis not present

## 2020-08-27 DIAGNOSIS — M6389 Disorders of muscle in diseases classified elsewhere, multiple sites: Secondary | ICD-10-CM | POA: Diagnosis not present

## 2020-08-27 DIAGNOSIS — I69391 Dysphagia following cerebral infarction: Secondary | ICD-10-CM | POA: Diagnosis not present

## 2020-08-27 DIAGNOSIS — M62562 Muscle wasting and atrophy, not elsewhere classified, left lower leg: Secondary | ICD-10-CM | POA: Diagnosis not present

## 2020-08-27 DIAGNOSIS — R2689 Other abnormalities of gait and mobility: Secondary | ICD-10-CM | POA: Diagnosis not present

## 2020-08-27 DIAGNOSIS — I6389 Other cerebral infarction: Secondary | ICD-10-CM | POA: Diagnosis not present

## 2020-08-27 NOTE — Telephone Encounter (Signed)
Returned call to Pt's daughter.  We discussed the history of Pt-when blood thinner was stopped and why.  Why it was not resumed (continued issue with hemorrhagic Baker's cyst)  All questions answered.  Daughter will bring device box to nursing home so we can continue to monitor Pt's pacemaker while she is in rehab for stroke.  Encouraged Pt and daughter.  Will schedule next remote for next week to give daughter time to get box to nursing home.

## 2020-08-27 NOTE — Telephone Encounter (Signed)
Pt daughter called in and would like to speak with Dr Lovena Le nurse just to catch up on some things that has happened and going on.  She stated she had a stroke 10/12 and now in rehab   Best number [phone]

## 2020-08-28 DIAGNOSIS — R2689 Other abnormalities of gait and mobility: Secondary | ICD-10-CM | POA: Diagnosis not present

## 2020-08-28 DIAGNOSIS — M6389 Disorders of muscle in diseases classified elsewhere, multiple sites: Secondary | ICD-10-CM | POA: Diagnosis not present

## 2020-08-28 DIAGNOSIS — R278 Other lack of coordination: Secondary | ICD-10-CM | POA: Diagnosis not present

## 2020-08-28 DIAGNOSIS — I6389 Other cerebral infarction: Secondary | ICD-10-CM | POA: Diagnosis not present

## 2020-08-28 DIAGNOSIS — M62562 Muscle wasting and atrophy, not elsewhere classified, left lower leg: Secondary | ICD-10-CM | POA: Diagnosis not present

## 2020-08-28 DIAGNOSIS — I69391 Dysphagia following cerebral infarction: Secondary | ICD-10-CM | POA: Diagnosis not present

## 2020-08-30 ENCOUNTER — Other Ambulatory Visit: Payer: Self-pay | Admitting: Internal Medicine

## 2020-08-30 DIAGNOSIS — E785 Hyperlipidemia, unspecified: Secondary | ICD-10-CM | POA: Diagnosis not present

## 2020-08-30 DIAGNOSIS — R2689 Other abnormalities of gait and mobility: Secondary | ICD-10-CM | POA: Diagnosis not present

## 2020-08-30 DIAGNOSIS — M62562 Muscle wasting and atrophy, not elsewhere classified, left lower leg: Secondary | ICD-10-CM | POA: Diagnosis not present

## 2020-08-30 DIAGNOSIS — I10 Essential (primary) hypertension: Secondary | ICD-10-CM | POA: Diagnosis not present

## 2020-08-30 DIAGNOSIS — L98492 Non-pressure chronic ulcer of skin of other sites with fat layer exposed: Secondary | ICD-10-CM

## 2020-08-30 DIAGNOSIS — I4891 Unspecified atrial fibrillation: Secondary | ICD-10-CM | POA: Diagnosis not present

## 2020-08-30 DIAGNOSIS — I6389 Other cerebral infarction: Secondary | ICD-10-CM | POA: Diagnosis not present

## 2020-08-30 DIAGNOSIS — E1122 Type 2 diabetes mellitus with diabetic chronic kidney disease: Secondary | ICD-10-CM | POA: Diagnosis not present

## 2020-08-30 DIAGNOSIS — M6389 Disorders of muscle in diseases classified elsewhere, multiple sites: Secondary | ICD-10-CM | POA: Diagnosis not present

## 2020-08-30 DIAGNOSIS — I482 Chronic atrial fibrillation, unspecified: Secondary | ICD-10-CM | POA: Diagnosis not present

## 2020-08-30 DIAGNOSIS — I69391 Dysphagia following cerebral infarction: Secondary | ICD-10-CM | POA: Diagnosis not present

## 2020-08-30 DIAGNOSIS — R278 Other lack of coordination: Secondary | ICD-10-CM | POA: Diagnosis not present

## 2020-08-30 LAB — CBC AND DIFFERENTIAL
HCT: 29 — AB (ref 36–46)
Hemoglobin: 9.6 — AB (ref 12.0–16.0)
Neutrophils Absolute: 10.4
Platelets: 197 (ref 150–399)
WBC: 13.9

## 2020-08-30 LAB — CBC: RBC: 3.19 — AB (ref 3.87–5.11)

## 2020-08-31 DIAGNOSIS — R278 Other lack of coordination: Secondary | ICD-10-CM | POA: Diagnosis not present

## 2020-08-31 DIAGNOSIS — I6389 Other cerebral infarction: Secondary | ICD-10-CM | POA: Diagnosis not present

## 2020-08-31 DIAGNOSIS — M62562 Muscle wasting and atrophy, not elsewhere classified, left lower leg: Secondary | ICD-10-CM | POA: Diagnosis not present

## 2020-08-31 DIAGNOSIS — R2689 Other abnormalities of gait and mobility: Secondary | ICD-10-CM | POA: Diagnosis not present

## 2020-08-31 DIAGNOSIS — I69391 Dysphagia following cerebral infarction: Secondary | ICD-10-CM | POA: Diagnosis not present

## 2020-08-31 DIAGNOSIS — M6389 Disorders of muscle in diseases classified elsewhere, multiple sites: Secondary | ICD-10-CM | POA: Diagnosis not present

## 2020-09-01 DIAGNOSIS — R2689 Other abnormalities of gait and mobility: Secondary | ICD-10-CM | POA: Diagnosis not present

## 2020-09-01 DIAGNOSIS — M62562 Muscle wasting and atrophy, not elsewhere classified, left lower leg: Secondary | ICD-10-CM | POA: Diagnosis not present

## 2020-09-01 DIAGNOSIS — I6389 Other cerebral infarction: Secondary | ICD-10-CM | POA: Diagnosis not present

## 2020-09-01 DIAGNOSIS — I69391 Dysphagia following cerebral infarction: Secondary | ICD-10-CM | POA: Diagnosis not present

## 2020-09-01 DIAGNOSIS — M6389 Disorders of muscle in diseases classified elsewhere, multiple sites: Secondary | ICD-10-CM | POA: Diagnosis not present

## 2020-09-01 DIAGNOSIS — R278 Other lack of coordination: Secondary | ICD-10-CM | POA: Diagnosis not present

## 2020-09-01 LAB — CUP PACEART REMOTE DEVICE CHECK
Battery Remaining Longevity: 127 mo
Battery Remaining Percentage: 95.5 %
Battery Voltage: 3.02 V
Brady Statistic RV Percent Paced: 1 %
Date Time Interrogation Session: 20211023153755
Implantable Lead Implant Date: 20200720
Implantable Lead Location: 753860
Implantable Pulse Generator Implant Date: 20200720
Lead Channel Impedance Value: 540 Ohm
Lead Channel Pacing Threshold Amplitude: 1 V
Lead Channel Pacing Threshold Pulse Width: 0.5 ms
Lead Channel Sensing Intrinsic Amplitude: 8.7 mV
Lead Channel Setting Pacing Amplitude: 2.5 V
Lead Channel Setting Pacing Pulse Width: 0.5 ms
Lead Channel Setting Sensing Sensitivity: 2 mV
Pulse Gen Model: 1272
Pulse Gen Serial Number: 9122450

## 2020-09-02 DIAGNOSIS — R278 Other lack of coordination: Secondary | ICD-10-CM | POA: Diagnosis not present

## 2020-09-02 DIAGNOSIS — I6389 Other cerebral infarction: Secondary | ICD-10-CM | POA: Diagnosis not present

## 2020-09-02 DIAGNOSIS — M6389 Disorders of muscle in diseases classified elsewhere, multiple sites: Secondary | ICD-10-CM | POA: Diagnosis not present

## 2020-09-02 DIAGNOSIS — I69391 Dysphagia following cerebral infarction: Secondary | ICD-10-CM | POA: Diagnosis not present

## 2020-09-02 DIAGNOSIS — R2689 Other abnormalities of gait and mobility: Secondary | ICD-10-CM | POA: Diagnosis not present

## 2020-09-02 DIAGNOSIS — M62562 Muscle wasting and atrophy, not elsewhere classified, left lower leg: Secondary | ICD-10-CM | POA: Diagnosis not present

## 2020-09-03 ENCOUNTER — Ambulatory Visit (INDEPENDENT_AMBULATORY_CARE_PROVIDER_SITE_OTHER): Payer: Medicare Other

## 2020-09-03 ENCOUNTER — Encounter: Payer: Medicare Other | Admitting: Family

## 2020-09-03 DIAGNOSIS — I69391 Dysphagia following cerebral infarction: Secondary | ICD-10-CM | POA: Diagnosis not present

## 2020-09-03 DIAGNOSIS — R278 Other lack of coordination: Secondary | ICD-10-CM | POA: Diagnosis not present

## 2020-09-03 DIAGNOSIS — M62562 Muscle wasting and atrophy, not elsewhere classified, left lower leg: Secondary | ICD-10-CM | POA: Diagnosis not present

## 2020-09-03 DIAGNOSIS — R55 Syncope and collapse: Secondary | ICD-10-CM | POA: Diagnosis not present

## 2020-09-03 DIAGNOSIS — I6389 Other cerebral infarction: Secondary | ICD-10-CM | POA: Diagnosis not present

## 2020-09-03 DIAGNOSIS — R2689 Other abnormalities of gait and mobility: Secondary | ICD-10-CM | POA: Diagnosis not present

## 2020-09-03 DIAGNOSIS — M6389 Disorders of muscle in diseases classified elsewhere, multiple sites: Secondary | ICD-10-CM | POA: Diagnosis not present

## 2020-09-06 ENCOUNTER — Non-Acute Institutional Stay (SKILLED_NURSING_FACILITY): Payer: Medicare Other | Admitting: Adult Health

## 2020-09-06 DIAGNOSIS — I10 Essential (primary) hypertension: Secondary | ICD-10-CM | POA: Diagnosis not present

## 2020-09-06 DIAGNOSIS — S36892D Contusion of other intra-abdominal organs, subsequent encounter: Secondary | ICD-10-CM | POA: Diagnosis not present

## 2020-09-06 DIAGNOSIS — R278 Other lack of coordination: Secondary | ICD-10-CM | POA: Diagnosis not present

## 2020-09-06 DIAGNOSIS — R1312 Dysphagia, oropharyngeal phase: Secondary | ICD-10-CM

## 2020-09-06 DIAGNOSIS — S32402D Unspecified fracture of left acetabulum, subsequent encounter for fracture with routine healing: Secondary | ICD-10-CM

## 2020-09-06 DIAGNOSIS — I69354 Hemiplegia and hemiparesis following cerebral infarction affecting left non-dominant side: Secondary | ICD-10-CM

## 2020-09-06 DIAGNOSIS — I4821 Permanent atrial fibrillation: Secondary | ICD-10-CM

## 2020-09-06 DIAGNOSIS — S3210XD Unspecified fracture of sacrum, subsequent encounter for fracture with routine healing: Secondary | ICD-10-CM

## 2020-09-06 DIAGNOSIS — I442 Atrioventricular block, complete: Secondary | ICD-10-CM | POA: Diagnosis not present

## 2020-09-06 DIAGNOSIS — I69391 Dysphagia following cerebral infarction: Secondary | ICD-10-CM | POA: Diagnosis not present

## 2020-09-06 DIAGNOSIS — D62 Acute posthemorrhagic anemia: Secondary | ICD-10-CM

## 2020-09-06 DIAGNOSIS — M6389 Disorders of muscle in diseases classified elsewhere, multiple sites: Secondary | ICD-10-CM | POA: Diagnosis not present

## 2020-09-06 DIAGNOSIS — Z9181 History of falling: Secondary | ICD-10-CM | POA: Diagnosis not present

## 2020-09-06 DIAGNOSIS — I6389 Other cerebral infarction: Secondary | ICD-10-CM | POA: Diagnosis not present

## 2020-09-06 DIAGNOSIS — R2689 Other abnormalities of gait and mobility: Secondary | ICD-10-CM | POA: Diagnosis not present

## 2020-09-06 DIAGNOSIS — M62562 Muscle wasting and atrophy, not elsewhere classified, left lower leg: Secondary | ICD-10-CM | POA: Diagnosis not present

## 2020-09-06 DIAGNOSIS — M84350S Stress fracture, pelvis, sequela: Secondary | ICD-10-CM | POA: Diagnosis not present

## 2020-09-07 ENCOUNTER — Encounter: Payer: Self-pay | Admitting: Adult Health

## 2020-09-07 DIAGNOSIS — M62562 Muscle wasting and atrophy, not elsewhere classified, left lower leg: Secondary | ICD-10-CM | POA: Diagnosis not present

## 2020-09-07 DIAGNOSIS — I69391 Dysphagia following cerebral infarction: Secondary | ICD-10-CM | POA: Diagnosis not present

## 2020-09-07 DIAGNOSIS — I6389 Other cerebral infarction: Secondary | ICD-10-CM | POA: Diagnosis not present

## 2020-09-07 DIAGNOSIS — R2689 Other abnormalities of gait and mobility: Secondary | ICD-10-CM | POA: Diagnosis not present

## 2020-09-07 DIAGNOSIS — R278 Other lack of coordination: Secondary | ICD-10-CM | POA: Diagnosis not present

## 2020-09-07 DIAGNOSIS — M6389 Disorders of muscle in diseases classified elsewhere, multiple sites: Secondary | ICD-10-CM | POA: Diagnosis not present

## 2020-09-07 NOTE — Progress Notes (Signed)
Remote pacemaker transmission.   

## 2020-09-07 NOTE — Progress Notes (Signed)
Location:  [facility]up[facility] psychologist of Service:  SNF (31) Provider:   Cindi Carbon, ANP [place] [phone]   [name], DO  Patient Care Team: [name], DO as PCP - General (Geriatric Medicine) [name], MD as PCP - Electrophysiology (Cardiology) [name], MD as Consulting Physician (Dermatology) [name], MD as Consulting Physician (Cardiology) [facility] [name], MD as Consulting Physician (Dermatology)  Extended Emergency Contact Information Primary Emergency Contact: [name] of [place] Phone: [phone] Mobile Phone: [phone] Relation: Daughter Secondary Emergency Contact: [name],[name]  [name] of [place] Phone: [phone] Relation: Son  Code Status:  DNR Goals of care: Advanced Directive information Advanced Directives 08/17/2020  Does Patient Have a Medical Advance Directive? Yes  Type of Advance Directive Out of facility DNR (pink MOST or yellow form)  Does patient want to make changes to medical advance directive? -  Copy of [place] in Chart? -  Pre-existing out of facility DNR order (yellow form or pink MOST form) Yellow form placed in chart (order not valid for inpatient use)     Chief Complaint  Patient presents with   Acute Visit    f/u CVA and pelvic fracture     HPI:  Pt is a 86 y.o. female seen today for an acute visit for f/u regarding CVA and pelvic fracture s/p fall on 10/12. 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  She has left sided weakness and dysphagia. She is currently tolerating a D3 diet with NTL well. She denies any cough or sob. Sats are WNL. She has not made progress with PT and continues to need a hoyer lift for transfers with minimal movement in the left arm and leg noted. She is alert and oriented much of the  day but can become confused in the even hrs. MMSE 23/30 on admission to rehab. Continues on baby aspirin due to prior bleeding episodes with anticoagulation. BP runs 120-160 range HR 90s. On room air with low grade temps each afternoon 99-99.5.    Pelvic fracture and sacral fracture: CT of the pelvis on 10/12 showed comminuted left superior and inferior pubic rami fractures with extraperitoneal hemorrhage and left sacral fracture extending to the SI joint without diastasis.   She is using tylenol, robaxin and ultram as needed for hip pain and left leg pain.   Hgb improved to 9.6 on 10/25 s/p hospital transfusion due to Hgb 7.3 on 10/18  WBC 13.9 08/30/20 improved from 15.9 on 08/18/20. No dysuria, frequency, cough, sore throat, sob, etc  Past Medical History:  Diagnosis Date   Actinic keratosis 05/25/2014   Anxiety    Atrial fibrillation ([place]) 09/21/2010   Basal cell carcinoma 05/25/2014   Multiple removed by Dr. Danella Sensing in March 2015: right neck, left neck, scalp    Bell's palsy 07/18/1979   Cervicalgia 01/31/2012   Closed fracture of lumbar vertebra without mention of spinal cord injury 07/17/2005   Conjunctiva disorder 12/26/2010   Coronary atherosclerosis of native coronary artery 07/17/1997   Cramp of limb 08/16/2009   Degeneration of lumbar or lumbosacral intervertebral disc 01/31/2012   Disturbance of skin sensation 08/2009   Dizziness and giddiness 11/08/2011   vertigo   External hemorrhoids without mention of complication 8/41/6606   External hemorrhoids without mention of complication 30/16/0109   Ganglion of tendon sheath  08/16/2009   Leg cramp 10/27/2013   Most frequently the right leg.    Long term (current) use of anticoagulants 09/2010   Lumbago 07/2009   Meralgia paresthetica 07/2007   MI, old    Myalgia and myositis, unspecified 01/17/2012   Osteoporosis    Other abnormal blood chemistry 04/08/2012   Other abnormal blood chemistry 2013    hyperglycemia   Other disorder of muscle, ligament, and fascia 04/08/2012   Other specified cardiac dysrhythmias(427.89) 06/27/2010   Pain in joint, ankle and foot 10/27/2013   Bilateral since 1998    Pain in joint, shoulder region 08/12/2012   Pain in joint, upper arm 12/26/2010   Pain in limb 01/11/2011   Pain in thoracic spine 01/31/2012   Pathologic fracture of vertebrae 01/22/2012   PVC's (premature ventricular contractions)    Rash and other nonspecific skin eruption 08/02/2011   Senile osteoporosis 07/17/1993   Unspecified essential hypertension 07/17/1997   Varicose veins of lower extremities 08/16/2009   Varicose veins of lower extremities 08/16/2009   Past Surgical History:  Procedure Laterality Date   COLONOSCOPY WITH PROPOFOL N/A 10/19/2015   Procedure: COLONOSCOPY WITH PROPOFOL;  Surgeon: Gatha Mayer, MD;  Location: [facility];  Service: Endoscopy;  Laterality: N/A;   CORONARY ARTERY BYPASS GRAFT  1998   x2; LIMA to LAD; SVG to diagonal off bypass   HEMORRHOID SURGERY  08/26/2012   Dr. Brantley Stage   LOOP RECORDER INSERTION N/A 05/13/2018   Procedure: LOOP RECORDER INSERTION;  Surgeon: [name], MD;  Location: [facility];  Service: Cardiovascular;  Laterality: N/A;   LOOP RECORDER REMOVAL N/A 05/26/2019   Procedure: LOOP RECORDER REMOVAL;  Surgeon: [name], MD;  Location: [facility];  Service: Cardiovascular;  Laterality: N/A;   MASS EXCISION Left 11/23/2015   Procedure: LEFT WRIST EXCISION CYST;  Surgeon: Leanora Cover, MD;  Location: [facility];  Service: Orthopedics;  Laterality: Left;   PACEMAKER IMPLANT N/A 05/26/2019   Procedure: PACEMAKER IMPLANT;  Surgeon: [name], MD;  Location: [facility];  Service: Cardiovascular;  Laterality: N/A;   SKIN BIOPSY  01/29/14   (R) neck; (R) scalp, 2 (L) neck; shave biopsy Superficial basal cell carcinoma Dr. Danella Sensing   TONSILLECTOMY  [phone]    Allergies    Allergen Reactions   Iodinated Diagnostic Agents Nausea Only and Other (See Comments)    Severe nausea and also passed out   Bactrim Rash   Relafen [Nabumetone] Rash   Sulfanilamide Rash    Outpatient Encounter Medications as of 09/06/2020  Medication Sig   acetaminophen (TYLENOL) 500 MG tablet Take 1,000 mg by mouth every 6 (six) hours as needed for mild pain (pain.).    aspirin EC 81 MG EC tablet Take 1 tablet (81 mg total) by mouth daily. Swallow whole.   atenolol (TENORMIN) 25 MG tablet Take 1 tablet (25 mg total) by mouth 2 (two) times daily.   calcium-vitamin D (OSCAL WITH D) 500-200 MG-UNIT tablet Take 1 tablet by mouth daily.   digoxin (LANOXIN) 0.125 MG tablet Take 1 tablet (125 mcg total) by mouth every other day.   lidocaine (LIDODERM) 5 % Place 1 patch onto the skin daily. Remove & Discard patch within 12 hours or as directed by MD   LORazepam (ATIVAN) 1 MG tablet Take 1 tablet (1 mg total) by mouth at bedtime.   Maltodextrin-Xanthan Gum (RESOURCE THICKENUP CLEAR) POWD Use as needed.   methocarbamol (ROBAXIN)  500 MG tablet Take 1 tablet (500 mg total) by mouth every 6 (six) hours as needed for muscle spasms.   ondansetron (ZOFRAN) 4 MG tablet Take 1 tablet (4 mg total) by mouth every 6 (six) hours as needed for nausea.   polyethylene glycol (MIRALAX / GLYCOLAX) 17 g packet Take 17 g by mouth daily.   traMADol (ULTRAM) 50 MG tablet Take 1 tablet (50 mg total) by mouth every 6 (six) hours as needed for moderate pain or severe pain.   No facility-administered encounter medications on file as of 09/06/2020.    Review of Systems  Constitutional: Negative for activity change, appetite change, chills, diaphoresis, fatigue, fever and unexpected weight change.  HENT: Positive for trouble swallowing. Negative for congestion.   Eyes: Negative for visual disturbance.  Respiratory: Negative for cough, shortness of breath and wheezing.   Cardiovascular: Positive for leg  swelling. Negative for chest pain and palpitations.  Gastrointestinal: Negative for abdominal distention, abdominal pain, constipation, diarrhea, nausea and vomiting.  Genitourinary: Negative for difficulty urinating and dysuria.  Musculoskeletal: Positive for gait problem. Negative for arthralgias, back pain, joint swelling and myalgias.  Neurological: Positive for speech difficulty and weakness. Negative for dizziness, tremors, seizures, syncope, facial asymmetry, light-headedness, numbness and headaches.  Psychiatric/Behavioral: Positive for confusion. Negative for agitation and behavioral problems.    Immunization History  Administered Date(s) Administered   Influenza Whole 08/06/2012   Influenza, High Dose Seasonal PF 08/15/2019   Influenza,inj,Quad PF,6+ Mos 08/30/2018   Influenza-Unspecified 08/06/2013, 08/24/2014, 08/26/2015, 08/31/2016, 08/27/2017   Moderna SARS-COVID-2 Vaccination 11/16/2019, 12/16/2019   Pneumococcal Conjugate-13 09/22/2015   Pneumococcal Polysaccharide-23 08/29/2006   Td 10/07/2007   Zoster 03/12/2008   Zoster Recombinat (Shingrix) 02/12/2018, 05/13/2018   Pertinent  Health Maintenance Due  Topic Date Due   INFLUENZA VACCINE  06/06/2020   DEXA SCAN  Completed   PNA vac Low Risk Adult  Completed   Fall Risk  08/26/2020 07/28/2020 02/25/2020 01/28/2020 09/02/2019  Falls in the past year? 1 0 0 0 0  Number falls in past yr: 0 1 0 0 0  Comment - - - - -  Injury with Fall? 1 0 0 0 0  Risk for fall due to : History of fall(s);Impaired balance/gait;Impaired mobility;Orthopedic patient - - - -  Follow up Falls evaluation completed;Education provided;Falls prevention discussed;Follow up appointment - - - -   Functional Status Survey:    Vitals:   09/07/20 0959  BP: (!) 161/85  Pulse: 97  Resp: 18  Temp: 97.9 F (36.6 C)  SpO2: 97%   There is no height or weight on file to calculate BMI. Physical Exam Vitals and nursing note reviewed.    Constitutional:      General: She is not in acute distress.    Appearance: She is not diaphoretic.  HENT:     Head: Normocephalic and atraumatic.     Mouth/Throat:     Mouth: Mucous membranes are moist.     Pharynx: Oropharynx is clear.  Neck:     Vascular: No JVD.  Cardiovascular:     Rate and Rhythm: Normal rate. Rhythm irregular.     Heart sounds: No murmur heard.      Comments: BPPP+2 Pulmonary:     Effort: Pulmonary effort is normal. No respiratory distress.     Breath sounds: Normal breath sounds. No wheezing.  Abdominal:     General: Bowel sounds are normal. There is no distension.     Palpations: Abdomen is soft.  Tenderness: There is no abdominal tenderness.  Musculoskeletal:        General: Swelling (mild non pitting to LUE) present.     Right lower leg: Edema present.     Left lower leg: Edema (mild non pitting) present.  Skin:    General: Skin is warm and dry.  Neurological:     Mental Status: She is alert and oriented to person, place, and time.     Comments: Left sided hemiplegia. No movement of the left arm. Able to slightly move the left toes. Slight left sided neglect noted. Able to move tongue, eyebrows, cheeks well. Numbness to the left cheek is appreciated.   Psychiatric:        Mood and Affect: Mood normal.     Labs reviewed: Recent Labs    03/04/20 0955 07/12/20 0000 07/22/20 0000 08/17/20 0731 08/18/20 0255  NA   < >  --  142 142 137  K   < >  --  4.1 3.7 3.9  CL   < >  --  104 105 106  CO2   < >  --  29* 24 23  GLUCOSE  --   --   --  112* 134*  BUN   < >  --  26* 24* 21  CREATININE   < >  --  1.0 0.89 1.01*  CALCIUM  --  9.8  --  9.3 8.7*   < > = values in this interval not displayed.   Recent Labs    03/04/20 0000 03/04/20 0955 08/17/20 0731 08/18/20 0255  AST  --  20 32 26  ALT  --  15 24 21   ALKPHOS  --   --  62 53  BILITOT  --   --  1.2 1.2  PROT  --   --  6.5 6.1*  ALBUMIN 3.4*  --  3.7 3.4*   Recent Labs     08/17/20 0731 08/17/20 0731 08/17/20 1322 08/17/20 1322 08/18/20 0255 08/18/20 0255 08/23/20 0521 08/23/20 1621 08/30/20 0000  WBC 15.5*   < > 12.7*   < > 15.9*  --  16.9*  --  13.9  NEUTROABS 11.9*  --  11.1*  --   --   --   --   --  10.40  HGB 11.2*   < > 9.7*   < > 9.1*   < > 7.3* 8.8* 9.6*  HCT 35.0*   < > 31.3*   < > 29.2*   < > 24.1* 27.9* 29*  MCV 92.3   < > 93.4  --  94.5  --  97.6  --   --   PLT 84*   < > 81*   < > 81*  --  138*  --  197   < > = values in this interval not displayed.   Lab Results  Component Value Date   TSH 3.80 09/14/2015   Lab Results  Component Value Date   HGBA1C 5.7 (H) 08/19/2020   Lab Results  Component Value Date   CHOL 166 07/22/2020   HDL 53 07/22/2020   LDLCALC 106 03/25/2019   LDLDIRECT 81.3 08/19/2020   TRIG 54 07/22/2020   CHOLHDL 2 09/12/2010    Significant Diagnostic Results in last 30 days:  DG Elbow 2 Views Left  Result Date: 08/17/2020 CLINICAL DATA:  Left elbow pain since a fall this morning. Initial encounter. EXAM: LEFT ELBOW - 2 VIEW COMPARISON:  None. FINDINGS: There is no  evidence of fracture, dislocation, or joint effusion. There is no evidence of arthropathy or other focal bone abnormality. Soft tissues are unremarkable. IMPRESSION: Negative exam. Electronically Signed   By: Inge Rise M.D.   On: 08/17/2020 15:28   DG Wrist 2 Views Left  Result Date: 08/17/2020 CLINICAL DATA:  Left wrist pain after a fall this morning. EXAM: LEFT WRIST - 2 VIEW COMPARISON:  None. FINDINGS: There is no evidence of fracture or dislocation. There is no evidence of arthropathy or other focal bone abnormality. Ulnar minus variance noted. Soft tissues are unremarkable. IMPRESSION: No acute abnormality. Electronically Signed   By: Inge Rise M.D.   On: 08/17/2020 15:29   CT PELVIS WO CONTRAST  Result Date: 08/17/2020 CLINICAL DATA:  Fall with left hip pain. EXAM: CT PELVIS WITHOUT CONTRAST TECHNIQUE: Multidetector CT imaging  of the pelvis was performed following the standard protocol without intravenous contrast. COMPARISON:  None. FINDINGS: Urinary Tract: Mild bladder wall thickening noted with incomplete bladder distention. Bowel:  Unremarkable visualized pelvic bowel loops. Vascular/Lymphatic: Atherosclerotic calcification noted distal aorta and common iliac arteries. No pelvic sidewall lymphadenopathy. Reproductive:  Unremarkable. Other:  No intraperitoneal free fluid. Musculoskeletal: Comminuted fracture of the left superior and inferior pubic rami. Superior ramus fracture extends into the left pubic bone. There is extensive extraperitoneal hemorrhage in the left pelvic floor extending into the anterior abdominal wall and displacing the bladder cranially and to the right. There is an associated fracture of the left sacrum extending into the SI joint without diastasis. IMPRESSION: 1. Comminuted fracture of the left superior and inferior pubic rami with extension into the left pubic bone. 2. Associated fracture of the left sacrum extending into the SI joint without diastasis. 3. Extensive extraperitoneal hemorrhage in the left anterior pelvic floor extending into the anterior abdominal wall and displacing the bladder cranially and to the right. 4. Aortic Atherosclerosis (ICD10-I70.0). Electronically Signed   By: Misty Stanley M.D.   On: 08/17/2020 07:12   MR ANGIO HEAD WO CONTRAST  Addendum Date: 08/18/2020   ADDENDUM REPORT: 08/18/2020 15:40 ADDENDUM: Findings discussed with Dr. Darrick Meigs at 3:38 PM. Electronically Signed   By: Margaretha Sheffield MD   On: 08/18/2020 15:40   Result Date: 08/18/2020 CLINICAL DATA:  Atrial fibrillation.  TIA versus stroke. EXAM: MRI HEAD WITHOUT CONTRAST MRA HEAD WITHOUT CONTRAST TECHNIQUE: Multiplanar, multiecho pulse sequences of the brain and surrounding structures were obtained without intravenous contrast. Angiographic images of the head were obtained using MRA technique without contrast.  COMPARISON:  Same day CT head FINDINGS: MRI HEAD FINDINGS Brain: There is an acute infarct within the posterior limb of the internal capsule, which involves a portion of the posterior putamen and extends into the overlying right corona radiata. Remote lacunar infarcts involving bilateral cerebellar hemispheres and possibly the left frontal white matter. Mild edema without substantial mass effect. Additional scattered T2/FLAIR hyperintensities throughout the white matter is compatible with chronic microvascular ischemic disease. Mild to moderate diffuse cerebral volume loss with ex vacuo ventricular dilation. No acute hemorrhage. No hydrocephalus. Skull and upper cervical spine: Normal marrow signal. Sinuses/Orbits: Mild scattered paranasal sinus mucosal thickening. No acute orbital abnormality. Other: No mastoid effusions. MRA HEAD FINDINGS Vascular evaluation is limited by patient motion. Within this limitation: Anterior circulation: There is apparent signal loss involving bilateral proximal right M2 MCA divisions, concerning for stenosis but poorly evaluated on this motion limited exam. Late branching of the left MCA bifurcation. No evidence of significant stenosis proximally involving the left MCA  or both ACAs. no evidence of aneurysm on this motionless limited exam. Posterior circulation: Diminutive left V4 vertebral artery, likely secondary to non dominant vertebral artery. Bilateral fetal type PCAs. Likely moderate stenosis of the right proximal P2 PCA. No significant stenosis of the left PCA. No evidence of aneurysm on this motion limited exam. IMPRESSION: 1. Acute infarct in the right basal ganglia, extending into the overlying corona radiata. Mild edema without substantial mass effect. 2. There is apparent signal loss involving bilateral proximal right M2 MCA divisions, concerning for stenosis but poorly evaluated on this motion limited exam. A CTA could further evaluate and quantify the degree of stenosis  if clinically indicated. 3. Bilateral fetal type PCAs with likely moderate stenosis of the right proximal P2 PCA. 4. Diminutive left V4 vertebral artery, likely secondary to non dominant vertebral artery. Dedicated neck CTA or MRA imaging could further evaluate if clinically indicated. 5. Chronic microvascular ischemic disease and remote lacunar infarcts, as detailed above. Electronically Signed: By: Margaretha Sheffield MD On: 08/18/2020 14:54   MR ANGIO NECK W WO CONTRAST  Result Date: 08/19/2020 CLINICAL DATA:  Stroke.  Rule out vertebral artery dissection. EXAM: MRA NECK WITHOUT AND WITH CONTRAST TECHNIQUE: Multiplanar and multiecho pulse sequences of the neck were obtained without and with intravenous contrast. Angiographic images of the neck were obtained using MRA technique without and with intravenous contrast. CONTRAST:  18mL GADAVIST GADOBUTROL 1 MMOL/ML IV SOLN COMPARISON:  MRI head and MRA head 08/18/2020 FINDINGS: Normal aortic arch.  Proximal great vessels widely patent. Carotid bifurcation widely patent bilaterally without significant stenosis. No irregularity or dissection Right vertebral artery dominant and patent to the basilar without stenosis Multiple areas of irregular stenosis in the mid and distal left vertebral artery. This vessel is small but patent to the basilar. Irregular stenosis most suggestive of atherosclerotic disease rather than dissection. IMPRESSION: No significant carotid stenosis Right vertebral dominant and widely patent Scattered areas of irregular stenosis in the mid and distal left vertebral artery most consistent with atherosclerotic disease. There is moderate stenosis distal left vertebral artery limiting antegrade flow. Electronically Signed   By: Franchot Gallo M.D.   On: 08/19/2020 15:16   MR BRAIN WO CONTRAST  Addendum Date: 08/18/2020   ADDENDUM REPORT: 08/18/2020 15:40 ADDENDUM: Findings discussed with Dr. Darrick Meigs at 3:38 PM. Electronically Signed   By: Margaretha Sheffield MD   On: 08/18/2020 15:40   Result Date: 08/18/2020 CLINICAL DATA:  Atrial fibrillation.  TIA versus stroke. EXAM: MRI HEAD WITHOUT CONTRAST MRA HEAD WITHOUT CONTRAST TECHNIQUE: Multiplanar, multiecho pulse sequences of the brain and surrounding structures were obtained without intravenous contrast. Angiographic images of the head were obtained using MRA technique without contrast. COMPARISON:  Same day CT head FINDINGS: MRI HEAD FINDINGS Brain: There is an acute infarct within the posterior limb of the internal capsule, which involves a portion of the posterior putamen and extends into the overlying right corona radiata. Remote lacunar infarcts involving bilateral cerebellar hemispheres and possibly the left frontal white matter. Mild edema without substantial mass effect. Additional scattered T2/FLAIR hyperintensities throughout the white matter is compatible with chronic microvascular ischemic disease. Mild to moderate diffuse cerebral volume loss with ex vacuo ventricular dilation. No acute hemorrhage. No hydrocephalus. Skull and upper cervical spine: Normal marrow signal. Sinuses/Orbits: Mild scattered paranasal sinus mucosal thickening. No acute orbital abnormality. Other: No mastoid effusions. MRA HEAD FINDINGS Vascular evaluation is limited by patient motion. Within this limitation: Anterior circulation: There is apparent signal loss involving  bilateral proximal right M2 MCA divisions, concerning for stenosis but poorly evaluated on this motion limited exam. Late branching of the left MCA bifurcation. No evidence of significant stenosis proximally involving the left MCA or both ACAs. no evidence of aneurysm on this motionless limited exam. Posterior circulation: Diminutive left V4 vertebral artery, likely secondary to non dominant vertebral artery. Bilateral fetal type PCAs. Likely moderate stenosis of the right proximal P2 PCA. No significant stenosis of the left PCA. No evidence of aneurysm on  this motion limited exam. IMPRESSION: 1. Acute infarct in the right basal ganglia, extending into the overlying corona radiata. Mild edema without substantial mass effect. 2. There is apparent signal loss involving bilateral proximal right M2 MCA divisions, concerning for stenosis but poorly evaluated on this motion limited exam. A CTA could further evaluate and quantify the degree of stenosis if clinically indicated. 3. Bilateral fetal type PCAs with likely moderate stenosis of the right proximal P2 PCA. 4. Diminutive left V4 vertebral artery, likely secondary to non dominant vertebral artery. Dedicated neck CTA or MRA imaging could further evaluate if clinically indicated. 5. Chronic microvascular ischemic disease and remote lacunar infarcts, as detailed above. Electronically Signed: By: Margaretha Sheffield MD On: 08/18/2020 14:54   MR BRAIN W CONTRAST  Result Date: 08/19/2020 CLINICAL DATA:  Stroke EXAM: MRI HEAD WITH CONTRAST TECHNIQUE: Multiplanar, multiecho pulse sequences of the brain and surrounding structures were obtained with intravenous contrast. CONTRAST:  30mL GADAVIST GADOBUTROL 1 MMOL/ML IV SOLN COMPARISON:  Unenhanced MRI head 08/18/2020 FINDINGS: Postcontrast only images obtained. No abnormal enhancement is identified. No mass lesion. There is generalized atrophy with chronic microvascular ischemic change in the white matter. Acute infarct right basal ganglia corona radiata best seen on diffusion-weighted imaging performed yesterday. IMPRESSION: No pathologic enhancement in the brain. Electronically Signed   By: Franchot Gallo M.D.   On: 08/19/2020 15:19   CT ABDOMEN PELVIS W CONTRAST  Result Date: 08/17/2020 CLINICAL DATA:  Pelvic fractures with hemorrhage EXAM: CT ABDOMEN AND PELVIS WITH CONTRAST TECHNIQUE: Multidetector CT imaging of the abdomen and pelvis was performed using the standard protocol following bolus administration of intravenous contrast. CONTRAST:  167mL OMNIPAQUE IOHEXOL  300 MG/ML  SOLN COMPARISON:  Pelvis CT earlier same day FINDINGS: Lower chest: Mild scarring at the lung bases. Partially imaged cardiomegaly. Hepatobiliary: Small hepatic cysts and other too small to characterize hypodense lesions. No hepatic injury or perihepatic hematoma. Gallbladder is unremarkable. Prominence of the proximal common bile duct with tapering. Pancreas: Unremarkable apart from some atrophy. Spleen: Too small to characterize hypodense lesion. No splenic injury or perisplenic hematoma. Adrenals/Urinary Tract: Bladder is displaced superiorly into the right by hemorrhage. Integrity of the bladder is not evaluated on this study. Small right renal cysts. Adrenals are unremarkable. Stomach/Bowel: Stomach is within normal limits. Bowel is normal in caliber. Vascular/Lymphatic: Diffuse area iliac atherosclerosis. No enlarged lymph nodes identified. Reproductive: Uterus and bilateral adnexa are unremarkable. Musculoskeletal: Left pelvic and sacral fractures as previously described. Associated extraperitoneal hemorrhage along the ventral abdominal wall and left pelvic sidewall extending into the left paracolic gutter. No substantial change. Chronic appearing severe L1 compression fracture. IMPRESSION: Pelvic and sacral fractures as described previously. Associated extraperitoneal hemorrhage is not substantially changed. No evidence of acute traumatic injury in the abdomen. Electronically Signed   By: Macy Mis M.D.   On: 08/17/2020 13:09   DG CHEST PORT 1 VIEW  Result Date: 08/17/2020 CLINICAL DATA:  Left hip and elbow pain post fall EXAM: PORTABLE CHEST  1 VIEW COMPARISON:  Radiograph 05/26/2019 FINDINGS: Low lung volumes with some atelectatic changes. Chronic biapical pleuroparenchymal scarring and coarsened reticular changes are similar to comparison exams. Postsurgical changes related to prior CABG including intact and aligned sternotomy wires and multiple surgical clips projecting over the  mediastinum. Prominent cardiac silhouette likely accentuated by the portable technique with a calcified aorta. Pacer pack overlies the left chest wall with single lead at the cardiac apex similar to prior. No acute osseous or soft tissue abnormality. Degenerative changes are present in the imaged spine and shoulders. Features of right shoulder calcific tendinosis/hydroxyapatite deposition. IMPRESSION: No acute cardiopulmonary abnormality. No acute traumatic findings in the chest. Aortic Atherosclerosis (ICD10-I70.0). Prior sternotomy and CABG. Well seated pacer lead in stable position. Electronically Signed   By: Lovena Le M.D.   On: 08/17/2020 15:03   DG Swallowing Func-Speech Pathology  Result Date: 08/20/2020 Objective Swallowing Evaluation: Type of Study: MBS-Modified Barium Swallow Study  Patient Details Name: Susan Conway MRN: [id number] Date of Birth: 1928/12/17 Today's Date: 08/20/2020 Time: SLP Start Time (ACUTE ONLY): 55 -SLP Stop Time (ACUTE ONLY): 1200 SLP Time Calculation (min) (ACUTE ONLY): 30 min Past Medical History: Past Medical History: Diagnosis Date  Actinic keratosis 05/25/2014  Anxiety   Atrial fibrillation ([place]) 09/21/2010  Basal cell carcinoma 05/25/2014  Multiple removed by Dr. Danella Sensing in March 2015: right neck, left neck, scalp   Bell's palsy 07/18/1979  Cervicalgia 01/31/2012  Closed fracture of lumbar vertebra without mention of spinal cord injury 07/17/2005  Conjunctiva disorder 12/26/2010  Coronary atherosclerosis of native coronary artery 07/17/1997  Cramp of limb 08/16/2009  Degeneration of lumbar or lumbosacral intervertebral disc 01/31/2012  Disturbance of skin sensation 08/2009  Dizziness and giddiness 11/08/2011  vertigo  External hemorrhoids without mention of complication 4/74/2595  External hemorrhoids without mention of complication 63/87/5643  Ganglion of tendon sheath 08/16/2009  Leg cramp 10/27/2013  Most frequently the right leg.   Long term (current)  use of anticoagulants 09/2010  Lumbago 07/2009  Meralgia paresthetica 07/2007  MI, old   Myalgia and myositis, unspecified 01/17/2012  Osteoporosis   Other abnormal blood chemistry 04/08/2012  Other abnormal blood chemistry 2013  hyperglycemia  Other disorder of muscle, ligament, and fascia 04/08/2012  Other specified cardiac dysrhythmias(427.89) 06/27/2010  Pain in joint, ankle and foot 10/27/2013  Bilateral since 1998   Pain in joint, shoulder region 08/12/2012  Pain in joint, upper arm 12/26/2010  Pain in limb 01/11/2011  Pain in thoracic spine 01/31/2012  Pathologic fracture of vertebrae 01/22/2012  PVC's (premature ventricular contractions)   Rash and other nonspecific skin eruption 08/02/2011  Senile osteoporosis 07/17/1993  Unspecified essential hypertension 07/17/1997  Varicose veins of lower extremities 08/16/2009  Varicose veins of lower extremities 08/16/2009 Past Surgical History: Past Surgical History: Procedure Laterality Date  COLONOSCOPY WITH PROPOFOL N/A 10/19/2015  Procedure: COLONOSCOPY WITH PROPOFOL;  Surgeon: Gatha Mayer, MD;  Location: [facility];  Service: Endoscopy;  Laterality: N/A;  CORONARY ARTERY BYPASS GRAFT  1998  x2; LIMA to LAD; SVG to diagonal off bypass  HEMORRHOID SURGERY  08/26/2012  Dr. Brantley Stage  LOOP RECORDER INSERTION N/A 05/13/2018  Procedure: LOOP RECORDER INSERTION;  Surgeon: [name], MD;  Location: [place] [facility];  Service: Cardiovascular;  Laterality: N/A;  LOOP RECORDER REMOVAL N/A 05/26/2019  Procedure: LOOP RECORDER REMOVAL;  Surgeon: [name], MD;  Location: [place] [facility];  Service: Cardiovascular;  Laterality: N/A;  MASS EXCISION Left 11/23/2015  Procedure: LEFT WRIST EXCISION  CYST;  Surgeon: Leanora Cover, MD;  Location: [facility];  Service: Orthopedics;  Laterality: Left;  PACEMAKER IMPLANT N/A 05/26/2019  Procedure: PACEMAKER IMPLANT;  Surgeon: [name], MD;  Location: [place] [facility];  Service:  Cardiovascular;  Laterality: N/A;  SKIN BIOPSY  01/29/14  (R) neck; (R) scalp, 2 (L) neck; shave biopsy Superficial basal cell carcinoma Dr. Danella Sensing  TONSILLECTOMY  [phone] HPI: 86 yo female resident of [facility] admit to [facility] after a fall and found to have severe multifocal pelvic fracture and a retroperitoneal hemorrhage. She is thrombocytopenia.  Pt found to have left sided weakness with progressive dysarthria and left facial droop and concern was present for CVA. CT brain negative.  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  Pt denies dysphagia prior to admission.  Subjective: pt awake in bed Assessment / Plan / Recommendation CHL IP CLINICAL IMPRESSIONS 08/20/2020 Clinical Impression Patient presents with a mild-moderate oropharyngeal dysphagia. Oral phase prolongued with lingual weakness and decreased bolus cohesion, delayed oral transit and premature spillage. Oral phase however functional with pureed and soft solid boluses. Pharyngeally, patient with consistent delay in swallow initiation which when combined with decreased laryngeal closure results in penetration and/or aspiration of thin liquids, ranging from transient with small boluses, to frank silent aspiration with large consecutive sips via straw. Note that however, with small straw sips and chin tucked, patient able to protect airway consistently. Mild base of tongue/vallecular residuals remain post swallow due to base of tongue weakness which patient clears with eventual second swallow. As patient did require moderate cueing for consistent use of chin tuck, recommend continuation of current diet to mitigate aspiration risks, advancing at bedside following SLP treatment to ensure proper use. Will f/u.  SLP Visit Diagnosis  Dysphagia, oropharyngeal phase (R13.12) Attention and concentration deficit following -- Frontal lobe and executive function deficit following -- Impact on safety and function Mild aspiration risk;Moderate aspiration risk   CHL IP TREATMENT RECOMMENDATION 08/20/2020 Treatment Recommendations Therapy as outlined in treatment plan below   Prognosis 08/20/2020 Prognosis for Safe Diet Advancement Good Barriers to Reach Goals -- Barriers/Prognosis Comment -- CHL IP DIET RECOMMENDATION 08/20/2020 SLP Diet Recommendations Dysphagia 3 (Mech soft) solids;Nectar thick liquid Liquid Administration via Cup;Straw Medication Administration Whole meds with puree Compensations Slow rate;Small sips/bites;Lingual sweep for clearance of pocketing Postural Changes Seated upright at 90 degrees   CHL IP OTHER RECOMMENDATIONS 08/20/2020 Recommended Consults -- Oral Care Recommendations Oral care BID Other Recommendations Order thickener from pharmacy;Prohibited food (jello, ice cream, thin soups);Remove water pitcher   CHL IP FOLLOW UP RECOMMENDATIONS 08/20/2020 Follow up Recommendations Skilled Nursing facility   [facility] IP FREQUENCY AND DURATION 08/20/2020 Speech Therapy Frequency (ACUTE ONLY) min 3x week Treatment Duration 2 weeks      CHL IP ORAL PHASE 08/20/2020 Oral Phase Impaired Oral - Pudding Teaspoon -- Oral - Pudding Cup -- Oral - Honey Teaspoon -- Oral - Honey Cup -- Oral - Nectar Teaspoon Weak lingual manipulation;Reduced posterior propulsion;Lingual/palatal residue;Delayed oral transit;Decreased bolus cohesion;Premature spillage Oral - Nectar Cup Weak lingual manipulation;Reduced posterior propulsion;Lingual/palatal residue;Delayed oral transit;Decreased bolus cohesion;Premature spillage Oral - Nectar Straw -- Oral - Thin Teaspoon Weak lingual manipulation;Reduced posterior propulsion;Lingual/palatal residue;Delayed oral transit;Decreased bolus cohesion;Premature spillage Oral - Thin Cup -- Oral -  Thin Straw -- Oral - Puree Weak  lingual manipulation;Reduced posterior propulsion;Lingual/palatal residue;Delayed oral transit;Decreased bolus cohesion;Premature spillage Oral - Mech Soft Weak lingual manipulation;Reduced posterior propulsion;Lingual/palatal residue;Delayed oral transit;Decreased bolus cohesion;Premature spillage Oral - Regular -- Oral - Multi-Consistency -- Oral - Pill Weak lingual manipulation;Reduced posterior propulsion;Lingual/palatal residue;Delayed oral transit;Decreased bolus cohesion;Premature spillage Oral Phase - Comment --  CHL IP PHARYNGEAL PHASE 08/20/2020 Pharyngeal Phase Impaired Pharyngeal- Pudding Teaspoon -- Pharyngeal -- Pharyngeal- Pudding Cup -- Pharyngeal -- Pharyngeal- Honey Teaspoon -- Pharyngeal -- Pharyngeal- Honey Cup -- Pharyngeal -- Pharyngeal- Nectar Teaspoon Delayed swallow initiation-pyriform sinuses;Reduced tongue base retraction;Pharyngeal residue - valleculae Pharyngeal -- Pharyngeal- Nectar Cup Delayed swallow initiation-pyriform sinuses;Reduced tongue base retraction;Pharyngeal residue - valleculae Pharyngeal -- Pharyngeal- Nectar Straw -- Pharyngeal -- Pharyngeal- Thin Teaspoon Delayed swallow initiation-pyriform sinuses;Reduced tongue base retraction;Pharyngeal residue - valleculae Pharyngeal -- Pharyngeal- Thin Cup -- Pharyngeal -- Pharyngeal- Thin Straw Delayed swallow initiation-pyriform sinuses;Reduced tongue base retraction;Pharyngeal residue - valleculae;Penetration/Aspiration during swallow;Reduced airway/laryngeal closure Pharyngeal Material enters airway, passes BELOW cords without attempt by patient to eject out (silent aspiration);Material enters airway, remains ABOVE vocal cords and not ejected out;Material enters airway, remains ABOVE vocal cords then ejected out Pharyngeal- Puree Delayed swallow initiation-pyriform sinuses;Reduced tongue base retraction;Pharyngeal residue - valleculae Pharyngeal -- Pharyngeal- Mechanical Soft Delayed swallow initiation-pyriform sinuses;Reduced  tongue base retraction;Pharyngeal residue - valleculae Pharyngeal -- Pharyngeal- Regular -- Pharyngeal -- Pharyngeal- Multi-consistency -- Pharyngeal -- Pharyngeal- Pill -- Pharyngeal -- Pharyngeal Comment --  Gabriel Rainwater MA, CCC-SLP McCoy Leah Meryl 08/20/2020, 12:23 PM              ECHOCARDIOGRAM COMPLETE  Result Date: 08/20/2020    ECHOCARDIOGRAM REPORT   Patient Name:   Susan Conway Date of Exam: 08/20/2020 Medical Rec #:  [id number]   Height:       66.0 in Accession #:    [id number]  Weight:       138.9 lb Date of Birth:  1929-01-06   BSA:          1.713 m Patient Age:    53 years    BP:           141/79 mmHg Patient Gender: F           HR:           98 bpm. Exam Location:  Inpatient Procedure: 2D Echo, Cardiac Doppler and Color Doppler Indications:    CVA  History:        Patient has prior history of Echocardiogram examinations, most                 recent 04/24/2017. CAD, Prior CABG and Pacemaker, Stroke,                 Arrythmias:Atrial Fibrillation; Risk Factors:Hypertension.  Sonographer:    Dustin Flock Referring Phys: [id number] [place]  1. Left ventricular ejection fraction, by estimation, is 60 to 65%. The left ventricle has normal function. The left ventricle has no regional wall motion abnormalities. There is mild left ventricular hypertrophy. Left ventricular diastolic parameters are indeterminate.  2. Right ventricular systolic function is mildly reduced. The right ventricular size is mildly enlarged. There is severely elevated pulmonary artery systolic pressure. The estimated right ventricular systolic pressure is 97.9 mmHg.  3. Tricuspid valve regurgitation is severe.  4. Biatrial size moderately dilated by visual estimate. Volumetric assessment suboptimal due to image quality.  5. The mitral valve is myxomatous. Mild mitral valve regurgitation. No evidence of mitral stenosis.  Borderline bileaflet mitral valve prolapse.  6. The aortic valve is calcified. There is  moderate calcification of the aortic valve. Aortic valve regurgitation is not visualized. Aortic valve sclerosis/calcification is present, without any evidence of aortic stenosis.  7. The inferior vena cava is dilated in size with <50% respiratory variability, suggesting right atrial pressure of 15 mmHg. Conclusion(s)/Recommendation(s): No intracardiac source of embolism detected on this transthoracic study. FINDINGS  Left Ventricle: Left ventricular ejection fraction, by estimation, is 60 to 65%. The left ventricle has normal function. The left ventricle has no regional wall motion abnormalities. The left ventricular internal cavity size was normal in size. There is  mild left ventricular hypertrophy. Left ventricular diastolic parameters are indeterminate. Indeterminate filling pressures. Right Ventricle: The right ventricular size is mildly enlarged. No increase in right ventricular wall thickness. Right ventricular systolic function is mildly reduced. There is severely elevated pulmonary artery systolic pressure. The tricuspid regurgitant velocity is 3.41 m/s, and with an assumed right atrial pressure of 15 mmHg, the estimated right ventricular systolic pressure is 45.8 mmHg. Left Atrium: Left atrial size was moderately dilated. Right Atrium: Right atrial size was moderately dilated. Pericardium: There is no evidence of pericardial effusion. Mitral Valve: The mitral valve is myxomatous. Mild mitral valve regurgitation. No evidence of mitral valve stenosis. Tricuspid Valve: The tricuspid valve is normal in structure. Tricuspid valve regurgitation is severe. No evidence of tricuspid stenosis. Aortic Valve: The aortic valve is calcified. There is moderate calcification of the aortic valve. Aortic valve regurgitation is not visualized. Mild to moderate aortic valve sclerosis/calcification is present, without any evidence of aortic stenosis. Pulmonic Valve: The pulmonic valve was not well visualized. Pulmonic valve  regurgitation is trivial. No evidence of pulmonic stenosis. Aorta: The aortic root is normal in size and structure. Venous: The inferior vena cava is dilated in size with less than 50% respiratory variability, suggesting right atrial pressure of 15 mmHg. IAS/Shunts: No atrial level shunt detected by color flow Doppler. Additional Comments: A is visualized in the right atrium and right ventricle.  LEFT VENTRICLE PLAX 2D LVIDd:         3.00 cm  Diastology LVIDs:         2.10 cm  LV e' medial:    7.29 cm/s LV PW:         1.20 cm  LV E/e' medial:  14.0 LV IVS:        1.00 cm  LV e' lateral:   11.10 cm/s LVOT diam:     2.00 cm  LV E/e' lateral: 9.2 LV SV:         48 LV SV Index:   28 LVOT Area:     3.14 cm  RIGHT VENTRICLE RV Basal diam:  4.00 cm RV S prime:     7.62 cm/s TAPSE (M-mode): 1.7 cm LEFT ATRIUM             Index       RIGHT ATRIUM           Index LA diam:        4.50 cm 2.63 cm/m  RA Area:     25.70 cm LA Vol (A2C):   60.7 ml 35.44 ml/m RA Volume:   76.20 ml  44.49 ml/m LA Vol (A4C):   46.8 ml 27.33 ml/m LA Biplane Vol: 53.3 ml 31.12 ml/m  AORTIC VALVE LVOT Vmax:   92.50 cm/s LVOT Vmean:  64.600 cm/s LVOT VTI:    0.154 m  AORTA Ao  Root diam: 3.00 cm MITRAL VALVE                TRICUSPID VALVE MV Area (PHT): 4.06 cm     TR Peak grad:   46.5 mmHg MV Decel Time: 187 msec     TR Vmax:        341.00 cm/s MV E velocity: 102.00 cm/s                             SHUNTS                             Systemic VTI:  0.15 m                             Systemic Diam: 2.00 cm Cherlynn Kaiser MD Electronically signed by Cherlynn Kaiser MD Signature Date/Time: 08/20/2020/9:48:59 AM    Final    CUP PACEART REMOTE DEVICE CHECK  Result Date: 09/01/2020 Scheduled remote reviewed. Normal device function.  Next remote 91 days. Kathy Breach, RN, CCDS, [employer]  CT HEAD CODE STROKE WO CONTRAST  Result Date: 08/17/2020 CLINICAL DATA:  Code stroke.  Left-sided facial droop EXAM: CT HEAD WITHOUT CONTRAST  TECHNIQUE: Contiguous axial images were obtained from the base of the skull through the vertex without intravenous contrast. COMPARISON:  None. FINDINGS: Brain: There is no mass, hemorrhage or extra-axial collection. There is generalized atrophy without lobar predilection. There is hypoattenuation of the periventricular white matter, most commonly indicating chronic ischemic microangiopathy. Vascular: No abnormal hyperdensity of the major intracranial arteries or dural venous sinuses. No intracranial atherosclerosis. Skull: The visualized skull base, calvarium and extracranial soft tissues are normal. Sinuses/Orbits: No fluid levels or advanced mucosal thickening of the visualized paranasal sinuses. No mastoid or middle ear effusion. The orbits are normal. ASPECTS [facility] [place] Stroke Program Early CT Score) - Ganglionic level infarction (caudate, lentiform nuclei, internal capsule, insula, M1-M3 cortex): 7 - Supraganglionic infarction (M4-M6 cortex): 3 Total score (0-10 with 10 being normal): 10 IMPRESSION: 1. No acute intracranial abnormality. 2. ASPECTS is 10. 3. Severe chronic ischemic changes of the white matter. These results were called by telephone at the time of interpretation on 08/17/2020 at 11:36 pm to provider Lang Snow , who verbally acknowledged these results. Electronically Signed   By: Ulyses Jarred M.D.   On: 08/17/2020 23:37   VAS US CAROTID  Result Date: 08/18/2020 Carotid Arterial Duplex Study Indications:       Stroke, new left-sided weakness. Comparison Study:  No prior studies. Performing Technologist: Darlin Coco  Examination Guidelines: A complete evaluation includes B-mode imaging, spectral Doppler, color Doppler, and power Doppler as needed of all accessible portions of each vessel. Bilateral testing is considered an integral part of a complete examination. Limited examinations for reoccurring indications may be performed as noted.  Right Carotid Findings:  +----------+--------+--------+--------+------------------+------------------+             PSV cm/s EDV cm/s Stenosis Plaque Description Comments            +----------+--------+--------+--------+------------------+------------------+  CCA Prox   53       11                                   intimal thickening  +----------+--------+--------+--------+------------------+------------------+  CCA Distal  56       12                heterogenous                           +----------+--------+--------+--------+------------------+------------------+  ICA Prox   49       17       1-39%    heterogenous                           +----------+--------+--------+--------+------------------+------------------+  ICA Distal 85       27                                                       +----------+--------+--------+--------+------------------+------------------+  ECA        64                                                                +----------+--------+--------+--------+------------------+------------------+ +----------+--------+-------+----------------+-------------------+             PSV cm/s EDV cms Describe         Arm Pressure (mmHG)  +----------+--------+-------+----------------+-------------------+  Subclavian 91               Multiphasic, WNL                      +----------+--------+-------+----------------+-------------------+ +---------+--------+--+--------+--+---------+  Vertebral PSV cm/s 60 EDV cm/s 26 Antegrade  +---------+--------+--+--------+--+---------+  Left Carotid Findings: +----------+--------+--------+--------+------------------+------------------+             PSV cm/s EDV cm/s Stenosis Plaque Description Comments            +----------+--------+--------+--------+------------------+------------------+  CCA Prox   77       17                                   intimal thickening  +----------+--------+--------+--------+------------------+------------------+  CCA Distal 85       23                calcific                                +----------+--------+--------+--------+------------------+------------------+  ICA Prox   74       21       1-39%    heterogenous                           +----------+--------+--------+--------+------------------+------------------+  ICA Distal 92       27                                   tortuous            +----------+--------+--------+--------+------------------+------------------+  ECA        82                                                                +----------+--------+--------+--------+------------------+------------------+ +----------+--------+--------+----------------+-------------------+  PSV cm/s EDV cm/s Describe         Arm Pressure (mmHG)  +----------+--------+--------+----------------+-------------------+  Subclavian 177               Multiphasic, WNL                      +----------+--------+--------+----------------+-------------------+ +---------+--------+--+--------+--+---------+  Vertebral PSV cm/s 75 EDV cm/s 12 Antegrade  +---------+--------+--+--------+--+---------+   Summary: Right Carotid: Velocities in the right ICA are consistent with a 1-39% stenosis. Left Carotid: Velocities in the left ICA are consistent with a 1-39% stenosis. Vertebrals:  Bilateral vertebral arteries demonstrate antegrade flow. Subclavians: Normal flow hemodynamics were seen in bilateral subclavian              arteries. *See table(s) above for measurements and observations.  Electronically signed by Antony Contras MD on 08/18/2020 at 10:49:24 AM.    Final     Assessment/Plan  1. 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 Oropharyngeal dysphagia Continue D3 diet with NTL  Continue ST Recommend IS 10 breaths QID for low grade temps (possible atx)  3. Closed nondisplaced fracture of left acetabulum with routine healing,  unspecified portion of acetabulum, subsequent encounter Continues with mild pain.  WBAT Continue PT and OT Continue robaxin, ultram, and tylenol as needed for pain  4. Closed fracture of sacrum with routine healing, unspecified portion of sacrum, subsequent encounter As #3  5. Permanent atrial fibrillation (HCC) Rate is controlled Only aspirin due to prior hx of bleeding   6. 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 Acute blood loss anemia Improved  8. Traumatic retroperitoneal hematoma, subsequent encounter Improving   9. Essential hypertension Improved from last week with occasional numbers in the 160 range   10. Leukocytosis Likely due to acute fracture and CVA event. Improving.   Family/ staff Communication: discussed with Ms. Zea and her nurse Amber   Labs/tests ordered:  NA

## 2020-09-08 ENCOUNTER — Other Ambulatory Visit: Payer: Self-pay

## 2020-09-08 DIAGNOSIS — R278 Other lack of coordination: Secondary | ICD-10-CM | POA: Diagnosis not present

## 2020-09-08 DIAGNOSIS — M6389 Disorders of muscle in diseases classified elsewhere, multiple sites: Secondary | ICD-10-CM | POA: Diagnosis not present

## 2020-09-08 DIAGNOSIS — R2689 Other abnormalities of gait and mobility: Secondary | ICD-10-CM | POA: Diagnosis not present

## 2020-09-08 DIAGNOSIS — M62562 Muscle wasting and atrophy, not elsewhere classified, left lower leg: Secondary | ICD-10-CM | POA: Diagnosis not present

## 2020-09-08 DIAGNOSIS — I69391 Dysphagia following cerebral infarction: Secondary | ICD-10-CM | POA: Diagnosis not present

## 2020-09-08 DIAGNOSIS — I6389 Other cerebral infarction: Secondary | ICD-10-CM | POA: Diagnosis not present

## 2020-09-08 MED ORDER — TRAMADOL HCL 50 MG PO TABS
50.0000 mg | ORAL_TABLET | Freq: Four times a day (QID) | ORAL | 0 refills | Status: DC | PRN
Start: 2020-09-08 — End: 2021-12-12

## 2020-09-08 MED ORDER — TRAMADOL HCL 50 MG PO TABS: 50.0000 mg | ORAL_TABLET | Freq: Four times a day (QID) | ORAL | 0 refills | Status: DC | PRN

## 2020-09-09 DIAGNOSIS — I6389 Other cerebral infarction: Secondary | ICD-10-CM | POA: Diagnosis not present

## 2020-09-09 DIAGNOSIS — M62562 Muscle wasting and atrophy, not elsewhere classified, left lower leg: Secondary | ICD-10-CM | POA: Diagnosis not present

## 2020-09-09 DIAGNOSIS — M6389 Disorders of muscle in diseases classified elsewhere, multiple sites: Secondary | ICD-10-CM | POA: Diagnosis not present

## 2020-09-09 DIAGNOSIS — R2689 Other abnormalities of gait and mobility: Secondary | ICD-10-CM | POA: Diagnosis not present

## 2020-09-09 DIAGNOSIS — I69391 Dysphagia following cerebral infarction: Secondary | ICD-10-CM | POA: Diagnosis not present

## 2020-09-09 DIAGNOSIS — R278 Other lack of coordination: Secondary | ICD-10-CM | POA: Diagnosis not present

## 2020-09-10 DIAGNOSIS — M62562 Muscle wasting and atrophy, not elsewhere classified, left lower leg: Secondary | ICD-10-CM | POA: Diagnosis not present

## 2020-09-10 DIAGNOSIS — R2689 Other abnormalities of gait and mobility: Secondary | ICD-10-CM | POA: Diagnosis not present

## 2020-09-10 DIAGNOSIS — R278 Other lack of coordination: Secondary | ICD-10-CM | POA: Diagnosis not present

## 2020-09-10 DIAGNOSIS — I69391 Dysphagia following cerebral infarction: Secondary | ICD-10-CM | POA: Diagnosis not present

## 2020-09-10 DIAGNOSIS — M6389 Disorders of muscle in diseases classified elsewhere, multiple sites: Secondary | ICD-10-CM | POA: Diagnosis not present

## 2020-09-10 DIAGNOSIS — I6389 Other cerebral infarction: Secondary | ICD-10-CM | POA: Diagnosis not present

## 2020-09-13 DIAGNOSIS — I6389 Other cerebral infarction: Secondary | ICD-10-CM | POA: Diagnosis not present

## 2020-09-13 DIAGNOSIS — M6389 Disorders of muscle in diseases classified elsewhere, multiple sites: Secondary | ICD-10-CM | POA: Diagnosis not present

## 2020-09-13 DIAGNOSIS — M62562 Muscle wasting and atrophy, not elsewhere classified, left lower leg: Secondary | ICD-10-CM | POA: Diagnosis not present

## 2020-09-13 DIAGNOSIS — I69391 Dysphagia following cerebral infarction: Secondary | ICD-10-CM | POA: Diagnosis not present

## 2020-09-13 DIAGNOSIS — R2689 Other abnormalities of gait and mobility: Secondary | ICD-10-CM | POA: Diagnosis not present

## 2020-09-13 DIAGNOSIS — R278 Other lack of coordination: Secondary | ICD-10-CM | POA: Diagnosis not present

## 2020-09-14 DIAGNOSIS — I6389 Other cerebral infarction: Secondary | ICD-10-CM | POA: Diagnosis not present

## 2020-09-14 DIAGNOSIS — I69391 Dysphagia following cerebral infarction: Secondary | ICD-10-CM | POA: Diagnosis not present

## 2020-09-14 DIAGNOSIS — M62562 Muscle wasting and atrophy, not elsewhere classified, left lower leg: Secondary | ICD-10-CM | POA: Diagnosis not present

## 2020-09-14 DIAGNOSIS — M6389 Disorders of muscle in diseases classified elsewhere, multiple sites: Secondary | ICD-10-CM | POA: Diagnosis not present

## 2020-09-14 DIAGNOSIS — R2689 Other abnormalities of gait and mobility: Secondary | ICD-10-CM | POA: Diagnosis not present

## 2020-09-14 DIAGNOSIS — R278 Other lack of coordination: Secondary | ICD-10-CM | POA: Diagnosis not present

## 2020-09-15 DIAGNOSIS — M6389 Disorders of muscle in diseases classified elsewhere, multiple sites: Secondary | ICD-10-CM | POA: Diagnosis not present

## 2020-09-15 DIAGNOSIS — R278 Other lack of coordination: Secondary | ICD-10-CM | POA: Diagnosis not present

## 2020-09-15 DIAGNOSIS — R2689 Other abnormalities of gait and mobility: Secondary | ICD-10-CM | POA: Diagnosis not present

## 2020-09-15 DIAGNOSIS — M62562 Muscle wasting and atrophy, not elsewhere classified, left lower leg: Secondary | ICD-10-CM | POA: Diagnosis not present

## 2020-09-15 DIAGNOSIS — I6389 Other cerebral infarction: Secondary | ICD-10-CM | POA: Diagnosis not present

## 2020-09-15 DIAGNOSIS — I69391 Dysphagia following cerebral infarction: Secondary | ICD-10-CM | POA: Diagnosis not present

## 2020-09-16 DIAGNOSIS — M62562 Muscle wasting and atrophy, not elsewhere classified, left lower leg: Secondary | ICD-10-CM | POA: Diagnosis not present

## 2020-09-16 DIAGNOSIS — R278 Other lack of coordination: Secondary | ICD-10-CM | POA: Diagnosis not present

## 2020-09-16 DIAGNOSIS — I69391 Dysphagia following cerebral infarction: Secondary | ICD-10-CM | POA: Diagnosis not present

## 2020-09-16 DIAGNOSIS — R2689 Other abnormalities of gait and mobility: Secondary | ICD-10-CM | POA: Diagnosis not present

## 2020-09-16 DIAGNOSIS — I6389 Other cerebral infarction: Secondary | ICD-10-CM | POA: Diagnosis not present

## 2020-09-16 DIAGNOSIS — M6389 Disorders of muscle in diseases classified elsewhere, multiple sites: Secondary | ICD-10-CM | POA: Diagnosis not present

## 2020-09-17 DIAGNOSIS — R278 Other lack of coordination: Secondary | ICD-10-CM | POA: Diagnosis not present

## 2020-09-17 DIAGNOSIS — I6389 Other cerebral infarction: Secondary | ICD-10-CM | POA: Diagnosis not present

## 2020-09-17 DIAGNOSIS — M62562 Muscle wasting and atrophy, not elsewhere classified, left lower leg: Secondary | ICD-10-CM | POA: Diagnosis not present

## 2020-09-17 DIAGNOSIS — M6389 Disorders of muscle in diseases classified elsewhere, multiple sites: Secondary | ICD-10-CM | POA: Diagnosis not present

## 2020-09-17 DIAGNOSIS — I69391 Dysphagia following cerebral infarction: Secondary | ICD-10-CM | POA: Diagnosis not present

## 2020-09-17 DIAGNOSIS — R2689 Other abnormalities of gait and mobility: Secondary | ICD-10-CM | POA: Diagnosis not present

## 2020-09-20 DIAGNOSIS — I69391 Dysphagia following cerebral infarction: Secondary | ICD-10-CM | POA: Diagnosis not present

## 2020-09-20 DIAGNOSIS — M6389 Disorders of muscle in diseases classified elsewhere, multiple sites: Secondary | ICD-10-CM | POA: Diagnosis not present

## 2020-09-20 DIAGNOSIS — R2689 Other abnormalities of gait and mobility: Secondary | ICD-10-CM | POA: Diagnosis not present

## 2020-09-20 DIAGNOSIS — R278 Other lack of coordination: Secondary | ICD-10-CM | POA: Diagnosis not present

## 2020-09-20 DIAGNOSIS — I6389 Other cerebral infarction: Secondary | ICD-10-CM | POA: Diagnosis not present

## 2020-09-20 DIAGNOSIS — M62562 Muscle wasting and atrophy, not elsewhere classified, left lower leg: Secondary | ICD-10-CM | POA: Diagnosis not present

## 2020-09-21 ENCOUNTER — Ambulatory Visit (HOSPITAL_COMMUNITY): Payer: Medicare Other

## 2020-09-21 DIAGNOSIS — I69391 Dysphagia following cerebral infarction: Secondary | ICD-10-CM | POA: Diagnosis not present

## 2020-09-21 DIAGNOSIS — R2689 Other abnormalities of gait and mobility: Secondary | ICD-10-CM | POA: Diagnosis not present

## 2020-09-21 DIAGNOSIS — M6389 Disorders of muscle in diseases classified elsewhere, multiple sites: Secondary | ICD-10-CM | POA: Diagnosis not present

## 2020-09-21 DIAGNOSIS — I6389 Other cerebral infarction: Secondary | ICD-10-CM | POA: Diagnosis not present

## 2020-09-21 DIAGNOSIS — R278 Other lack of coordination: Secondary | ICD-10-CM | POA: Diagnosis not present

## 2020-09-21 DIAGNOSIS — M62562 Muscle wasting and atrophy, not elsewhere classified, left lower leg: Secondary | ICD-10-CM | POA: Diagnosis not present

## 2020-09-22 ENCOUNTER — Other Ambulatory Visit: Payer: Self-pay

## 2020-09-22 DIAGNOSIS — F419 Anxiety disorder, unspecified: Secondary | ICD-10-CM

## 2020-09-22 DIAGNOSIS — I6389 Other cerebral infarction: Secondary | ICD-10-CM | POA: Diagnosis not present

## 2020-09-22 DIAGNOSIS — M6389 Disorders of muscle in diseases classified elsewhere, multiple sites: Secondary | ICD-10-CM | POA: Diagnosis not present

## 2020-09-22 DIAGNOSIS — R278 Other lack of coordination: Secondary | ICD-10-CM | POA: Diagnosis not present

## 2020-09-22 DIAGNOSIS — R2689 Other abnormalities of gait and mobility: Secondary | ICD-10-CM | POA: Diagnosis not present

## 2020-09-22 DIAGNOSIS — I69391 Dysphagia following cerebral infarction: Secondary | ICD-10-CM | POA: Diagnosis not present

## 2020-09-22 DIAGNOSIS — M62562 Muscle wasting and atrophy, not elsewhere classified, left lower leg: Secondary | ICD-10-CM | POA: Diagnosis not present

## 2020-09-22 MED ORDER — LORAZEPAM 1 MG PO TABS: 1.0000 mg | ORAL_TABLET | Freq: Every day | ORAL | 5 refills | Status: DC

## 2020-09-23 ENCOUNTER — Encounter: Payer: Self-pay | Admitting: Nurse Practitioner

## 2020-09-23 ENCOUNTER — Telehealth: Payer: Self-pay

## 2020-09-23 ENCOUNTER — Ambulatory Visit (INDEPENDENT_AMBULATORY_CARE_PROVIDER_SITE_OTHER): Payer: Medicare Other | Admitting: Nurse Practitioner

## 2020-09-23 ENCOUNTER — Other Ambulatory Visit: Payer: Self-pay

## 2020-09-23 DIAGNOSIS — M6389 Disorders of muscle in diseases classified elsewhere, multiple sites: Secondary | ICD-10-CM | POA: Diagnosis not present

## 2020-09-23 DIAGNOSIS — Z0000 Encounter for general adult medical examination without abnormal findings: Secondary | ICD-10-CM | POA: Diagnosis not present

## 2020-09-23 DIAGNOSIS — M62562 Muscle wasting and atrophy, not elsewhere classified, left lower leg: Secondary | ICD-10-CM | POA: Diagnosis not present

## 2020-09-23 DIAGNOSIS — R278 Other lack of coordination: Secondary | ICD-10-CM | POA: Diagnosis not present

## 2020-09-23 DIAGNOSIS — E2839 Other primary ovarian failure: Secondary | ICD-10-CM

## 2020-09-23 DIAGNOSIS — I69391 Dysphagia following cerebral infarction: Secondary | ICD-10-CM | POA: Diagnosis not present

## 2020-09-23 DIAGNOSIS — R2689 Other abnormalities of gait and mobility: Secondary | ICD-10-CM | POA: Diagnosis not present

## 2020-09-23 DIAGNOSIS — I6389 Other cerebral infarction: Secondary | ICD-10-CM | POA: Diagnosis not present

## 2020-09-23 NOTE — Telephone Encounter (Signed)
[name], Susan Conway are scheduled for a virtual visit with your provider today.    Just as we do with appointments in the office, we must obtain your consent to participate.  Your consent will be active for this visit and any virtual visit you may have with one of our providers in the next 365 days.    If you have a MyChart account, I can also send a copy of this consent to you electronically.  All virtual visits are billed to your insurance company just like a traditional visit in the office.  As this is a virtual visit, video technology does not allow for your provider to perform a traditional examination.  This may limit your provider's ability to fully assess your condition.  If your provider identifies any concerns that need to be evaluated in person or the need to arrange testing such as labs, EKG, etc, we will make arrangements to do so.    Although advances in technology are sophisticated, we cannot ensure that it will always work on either your end or our end.  If the connection with a video visit is poor, we may have to switch to a telephone visit.  With either a video or telephone visit, we are not always able to ensure that we have a secure connection.   I need to obtain your verbal consent now.   Are you willing to proceed with your visit today?   Susan Conway has provided verbal consent on 09/23/2020 for a virtual visit (video or telephone).   Carroll Kinds, CMA 09/23/2020  11:50 AM

## 2020-09-23 NOTE — Progress Notes (Signed)
Subjective:   Susan Conway is a 86 y.o. female who presents for Medicare Annual (Subsequent) preventive examination.  Review of Systems     Cardiac Risk Factors include: dyslipidemia;hypertension;advanced age (>57men, >49 women)     Objective:    There were no vitals filed for this visit. There is no height or weight on file to calculate BMI.  Advanced Directives 09/23/2020 08/17/2020 08/17/2020 07/28/2020 03/02/2020 02/25/2020 01/28/2020  Does Patient Have a Medical Advance Directive? Yes Yes Yes Yes Yes Yes Yes  Type of Paramedic of [place];Out of facility DNR (pink MOST or yellow form) Out of facility DNR (pink MOST or yellow form) Out of facility DNR (pink MOST or yellow form) Out of facility DNR (pink MOST or yellow form) Healthcare Power of [place];Living will [place];Living will [place]  Does patient want to make changes to medical advance directive? No - Patient declined - No - Patient declined No - Patient declined No - Patient declined No - Patient declined No - Patient declined  Copy of [place] in Chart? Yes - validated most recent copy scanned in chart (See row information) - - - Yes - validated most recent copy scanned in chart (See row information) Yes - validated most recent copy scanned in chart (See row information) Yes - validated most recent copy scanned in chart (See row information)  Pre-existing out of facility DNR order (yellow form or pink MOST form) Yellow form placed in chart (order not valid for inpatient use) Yellow form placed in chart (order not valid for inpatient use) - - - - -    Current Medications (verified) Outpatient Encounter Medications as of 09/23/2020  Medication Sig   acetaminophen (TYLENOL) 500 MG tablet Take 1,000 mg by mouth every 6 (six) hours as needed for mild pain (pain.).    aspirin EC 81 MG EC tablet Take 1 tablet (81 mg total) by mouth daily. Swallow  whole.   atenolol (TENORMIN) 25 MG tablet Take 1 tablet (25 mg total) by mouth 2 (two) times daily.   calcium-vitamin D (OSCAL WITH D) 500-200 MG-UNIT tablet Take 1 tablet by mouth daily.   digoxin (LANOXIN) 0.125 MG tablet Take 1 tablet (125 mcg total) by mouth every other day.   lidocaine (LIDODERM) 5 % Place 1 patch onto the skin daily. Remove & Discard patch within 12 hours or as directed by MD   LORazepam (ATIVAN) 1 MG tablet Take 1 tablet (1 mg total) by mouth at bedtime.   methocarbamol (ROBAXIN) 500 MG tablet Take 1 tablet (500 mg total) by mouth every 6 (six) hours as needed for muscle spasms.   ondansetron (ZOFRAN) 4 MG tablet Take 1 tablet (4 mg total) by mouth every 6 (six) hours as needed for nausea.   polyethylene glycol (MIRALAX / GLYCOLAX) 17 g packet Take 17 g by mouth daily.   traMADol (ULTRAM) 50 MG tablet Take 1 tablet (50 mg total) by mouth every 6 (six) hours as needed for moderate pain or severe pain.   [DISCONTINUED] Maltodextrin-Xanthan Gum (RESOURCE THICKENUP CLEAR) POWD Use as needed.   No facility-administered encounter medications on file as of 09/23/2020.    Allergies (verified) Iodinated diagnostic agents, Bactrim, Relafen [nabumetone], and Sulfanilamide   History: Past Medical History:  Diagnosis Date   Actinic keratosis 05/25/2014   Anxiety    Atrial fibrillation ([place]) 09/21/2010   Basal cell carcinoma 05/25/2014   Multiple removed by Dr. Danella Sensing  in March 2015: right neck, left neck, scalp    Bell's palsy 07/18/1979   Cervicalgia 01/31/2012   Closed fracture of lumbar vertebra without mention of spinal cord injury 07/17/2005   Conjunctiva disorder 12/26/2010   Coronary atherosclerosis of native coronary artery 07/17/1997   Cramp of limb 08/16/2009   Degeneration of lumbar or lumbosacral intervertebral disc 01/31/2012   Disturbance of skin sensation 08/2009   Dizziness and giddiness 11/08/2011   vertigo   External hemorrhoids  without mention of complication 3/71/0626   External hemorrhoids without mention of complication 94/85/4627   Ganglion of tendon sheath 08/16/2009   Leg cramp 10/27/2013   Most frequently the right leg.    Long term (current) use of anticoagulants 09/2010   Lumbago 07/2009   Meralgia paresthetica 07/2007   MI, old    Myalgia and myositis, unspecified 01/17/2012   Osteoporosis    Other abnormal blood chemistry 04/08/2012   Other abnormal blood chemistry 2013   hyperglycemia   Other disorder of muscle, ligament, and fascia 04/08/2012   Other specified cardiac dysrhythmias(427.89) 06/27/2010   Pacemaker 05/06/2020   Pain in joint, ankle and foot 10/27/2013   Bilateral since 1998    Pain in joint, shoulder region 08/12/2012   Pain in joint, upper arm 12/26/2010   Pain in limb 01/11/2011   Pain in thoracic spine 01/31/2012   Pathologic fracture of vertebrae 01/22/2012   PVC's (premature ventricular contractions)    Rash and other nonspecific skin eruption 08/02/2011   Senile osteoporosis 07/17/1993   Stroke ([place])    Unspecified essential hypertension 07/17/1997   Varicose veins of lower extremities 08/16/2009   Varicose veins of lower extremities 08/16/2009   Past Surgical History:  Procedure Laterality Date   COLONOSCOPY WITH PROPOFOL N/A 10/19/2015   Procedure: COLONOSCOPY WITH PROPOFOL;  Surgeon: Gatha Mayer, MD;  Location: [facility];  Service: Endoscopy;  Laterality: N/A;   CORONARY ARTERY BYPASS GRAFT  1998   x2; LIMA to LAD; SVG to diagonal off bypass   HEMORRHOID SURGERY  08/26/2012   Dr. Brantley Stage   LOOP RECORDER INSERTION N/A 05/13/2018   Procedure: LOOP RECORDER INSERTION;  Surgeon: [name], MD;  Location: [facility];  Service: Cardiovascular;  Laterality: N/A;   LOOP RECORDER REMOVAL N/A 05/26/2019   Procedure: LOOP RECORDER REMOVAL;  Surgeon: [name], MD;  Location: [facility];  Service: Cardiovascular;  Laterality:  N/A;   MASS EXCISION Left 11/23/2015   Procedure: LEFT WRIST EXCISION CYST;  Surgeon: Leanora Cover, MD;  Location: [facility];  Service: Orthopedics;  Laterality: Left;   PACEMAKER IMPLANT N/A 05/26/2019   Procedure: PACEMAKER IMPLANT;  Surgeon: [name], MD;  Location: [facility];  Service: Cardiovascular;  Laterality: N/A;   SKIN BIOPSY  01/29/14   (R) neck; (R) scalp, 2 (L) neck; shave biopsy Superficial basal cell carcinoma Dr. Danella Sensing   TONSILLECTOMY  [phone]   Family History  Problem Relation Age of Onset   Heart attack Father 55   Heart disease Father    Hypertension Sister    Heart disease Sister    Heart disease Brother    Social History   Socioeconomic History   Marital status: Widowed    Spouse name: Not on file   Number of children: Not on file   Years of education: Not on file   Highest education level: Not on file  Occupational History   Occupation: Retired Pharmacist, hospital  Tobacco  Use   Smoking status: Never Smoker   Smokeless tobacco: Never Used  Vaping Use   Vaping Use: Never used  Substance and Sexual Activity   Alcohol use: Yes    Comment: occasional wine   Drug use: No   Sexual activity: Never  Other Topics Concern   Not on file  Social History Narrative   Living at [facility] since 2010   Widowed 09/03/2016   Never smoked   Alcohol occasional wine   Exercise, house work    DNR , POA, Living Will   Social Determinants of Health   Financial Resource Strain:    Difficulty of Paying Living Expenses: Not on file  Food Insecurity:    Worried About Charity fundraiser in the Last Year: Not on file   [place] in the Last Year: Not on file  Transportation Needs:    Lack of Transportation (Medical): Not on file   Lack of Transportation (Non-Medical): Not on file  Physical Activity:    Days of Exercise per Week: Not on file   Minutes of Exercise per Session: Not on file  Stress:    Feeling  of Stress : Not on file  Social Connections:    Frequency of Communication with Friends and Family: Not on file   Frequency of Social Gatherings with Friends and Family: Not on file   Attends Religious Services: Not on file   Active Member of Clubs or Organizations: Not on file   Attends Archivist Meetings: Not on file   Marital Status: Not on file    Tobacco Counseling Counseling given: Not Answered   Clinical Intake:  Pre-visit preparation completed: Yes  Pain : No/denies pain     BMI - recorded: 22 Nutritional Status: BMI of 19-24  Normal Nutritional Risks: None Diabetes: No  How often do you need to have someone help you when you read instructions, pamphlets, or other written materials from your doctor or pharmacy?: 1 - Never  Diabetic?no         Activities of Daily Living In your present state of health, do you have any difficulty performing the following activities: 09/23/2020 08/17/2020  Hearing? Y Y  Comment - wears 2 hearing aides  Vision? N N  Comment - hx bilateral cataract surgery  Difficulty concentrating or making decisions? N N  Walking or climbing stairs? Y Y  Comment recent CVA -  Dressing or bathing? Y N  Doing errands, shopping? Y Y  Comment recent CVA -  Preparing Food and eating ? Y -  Comment recent CVA, food has been prepared for her -  Using the Toilet? N -  In the past six months, have you accidently leaked urine? Y -  Do you have problems with loss of bowel control? Y -  Managing your Medications? N -  Managing your Finances? N -  Housekeeping or managing your Housekeeping? N -  Some recent data might be hidden    Patient Care Team: [name], DO as PCP - General (Geriatric Medicine) [name], MD as PCP - Electrophysiology (Cardiology) [name], MD as Consulting Physician (Dermatology) [name], MD as Consulting Physician (Cardiology) [facility] [name],  MD as Consulting Physician (Dermatology)  Indicate any recent Medical Services you may have received from other than [facility] providers in the past year (date may be approximate).     Assessment:   This is a routine wellness examination for [place].  Hearing/Vision screen  Hearing Screening   125Hz  250Hz  500Hz  1000Hz  2000Hz  3000Hz  4000Hz  6000Hz  8000Hz   Right ear:           Left ear:           Comments: Patient wears hearing aids.  Vision Screening Comments: Patient has no vision problems. Patient wears glasses. Patient had cataract surgery about 8 months ago.Patient had eye exam about  a year ago. She sees Dr. Lyndal Pulley  Dietary issues and exercise activities discussed: Current Exercise Habits: The patient does not participate in regular exercise at present  Goals     Maintain Lifestyle     Starting today pt will maintain lifestyle.      Patient Stated     To get back to baseline prior to stroke.       Depression Screen PHQ 2/9 Scores 09/23/2020 08/26/2020 07/28/2020 02/25/2020 01/28/2020 09/02/2019 04/02/2019  PHQ - 2 Score 0 0 0 0 0 0 0    Fall Risk Fall Risk  09/23/2020 08/26/2020 07/28/2020 02/25/2020 01/28/2020  Falls in the past year? 1 1 0 0 0  Number falls in past yr: 0 0 1 0 0  Comment - - - - -  Injury with Fall? 1 1 0 0 0  Comment Patient fractured pelvis and had a stroke - - - -  Risk for fall due to : - History of fall(s);Impaired balance/gait;Impaired mobility;Orthopedic patient - - -  Follow up - Falls evaluation completed;Education provided;Falls prevention discussed;Follow up appointment - - -    Any stairs in or around the home? No  If so, are there any without handrails? No  Home free of loose throw rugs in walkways, pet beds, electrical cords, etc? Yes  Adequate lighting in your home to reduce risk of falls? Yes   ASSISTIVE DEVICES UTILIZED TO PREVENT FALLS:  Life alert? No  Use of a cane, walker or w/c? Yes  Grab bars in the bathroom? Yes  Shower chair or  bench in shower? Yes  Elevated toilet seat or a handicapped toilet? Yes   TIMED UP AND GO:  Was the test performed? No .    Cognitive Function: MMSE - Mini Mental State Exam 09/10/2018 08/14/2017 09/22/2015  Orientation to time 5 5 5   Orientation to Place 5 5 5   Registration 3 3 3   Attention/ Calculation 5 5 5   Recall 3 2 3   Language- name 2 objects 2 2 2   Language- repeat 1 1 1   Language- follow 3 step command 3 3 3   Language- read & follow direction 1 1 1   Write a sentence 1 1 1   Copy design 1 1 1   Total score 30 29 30      6CIT Screen 09/23/2020 09/02/2019  What Year? 0 points 0 points  What month? 0 points 0 points  What time? 0 points 0 points  Count back from 20 0 points 0 points  Months in reverse 0 points 0 points  Repeat phrase 0 points 0 points  Total Score 0 0    Immunizations Immunization History  Administered Date(s) Administered   Influenza Whole 08/06/2012   Influenza, High Dose Seasonal PF 08/15/2019   Influenza,inj,Quad PF,6+ Mos 08/30/2018   Influenza-Unspecified 08/06/2013, 08/24/2014, 08/26/2015, 08/31/2016, 08/27/2017   Moderna SARS-COVID-2 Vaccination 11/16/2019, 12/16/2019   Pneumococcal Conjugate-13 09/22/2015   Pneumococcal Polysaccharide-23 08/29/2006   Td 10/07/2007   Zoster 03/12/2008   Zoster Recombinat (Shingrix) 02/12/2018, 05/13/2018    TDAP status: Due, Education has been provided regarding  the importance of this vaccine. Advised may receive this vaccine at local pharmacy or Health Dept. Aware to provide a copy of the vaccination record if obtained from local pharmacy or Health Dept. Verbalized acceptance and understanding. Due for flu vaccine  Pneumococcal vaccine status: Up to date Covid-19 vaccine status: Completed vaccines  Qualifies for Shingles Vaccine? Yes   Zostavax completed Yes   Shingrix Completed?: Yes  Screening Tests Health Maintenance  Topic Date Due   TETANUS/TDAP  10/06/2017   INFLUENZA VACCINE   06/06/2020   DEXA SCAN  Completed   COVID-19 Vaccine  Completed   PNA vac Low Risk Adult  Completed    Health Maintenance  Health Maintenance Due  Topic Date Due   TETANUS/TDAP  10/06/2017   INFLUENZA VACCINE  06/06/2020    Colorectal cancer screening: No longer required.  Mammogram status: No longer required.  Bone Density status: Ordered today. Pt provided with contact info and advised to call to schedule appt.  Lung Cancer Screening: (Low Dose CT Chest recommended if Age 47-80 years, 30 pack-year currently smoking OR have quit w/in 15years.) does not qualify.   Lung Cancer Screening Referral: na  Additional Screening:  Hepatitis C Screening: does not qualify; Completed na  Vision Screening: Recommended annual ophthalmology exams for early detection of glaucoma and other disorders of the eye. Is the patient up to date with their annual eye exam?  Yes  Who is the provider or what is the name of the office in which the patient attends annual eye exams? [email or web address] If pt is not established with a provider, would they like to be referred to a provider to establish care? No .   Dental Screening: Recommended annual dental exams for proper oral hygiene  Community Resource Referral / Chronic Care Management: CRR required this visit?  No   CCM required this visit?  No      Plan:     I have personally reviewed and noted the following in the patients chart:    Medical and social history  Use of alcohol, tobacco or illicit drugs   Current medications and supplements  Functional ability and status  Nutritional status  Physical activity  Advanced directives  List of other physicians  Hospitalizations, surgeries, and ER visits in previous 12 months  Vitals  Screenings to include cognitive, depression, and falls  Referrals and appointments  In addition, I have reviewed and discussed with patient certain preventive protocols, quality metrics, and best practice  recommendations. A written personalized care plan for preventive services as well as general preventive health recommendations were provided to patient.     Lauree Chandler, NP   09/23/2020    Virtual Visit via Telephone Note  I connected with@ on 09/23/20 at 11:30 AM EST by telephone and verified that I am speaking with the correct person using two identifiers.  Location: Patient: home Provider: [facility]    I discussed the limitations, risks, security and privacy concerns of performing an evaluation and management service by telephone and the availability of in person appointments. I also discussed with the patient that there may be a patient responsible charge related to this service. The patient expressed understanding and agreed to proceed.   I discussed the assessment and treatment plan with the patient. The patient was provided an opportunity to ask questions and all were answered. The patient agreed with the plan and demonstrated an understanding of the instructions.   The patient was advised to call back or seek  an in-person evaluation if the symptoms worsen or if the condition fails to improve as anticipated.  I provided 18 minutes of non-face-to-face time during this encounter.  Carlos American. Harle Battiest Avs printed and mailed

## 2020-09-23 NOTE — Progress Notes (Signed)
This service is provided via telemedicine  No vital signs collected/recorded due to the encounter was a telemedicine visit.   Location of patient (ex: home, work):    Patient consents to a telephone visit:  Yes, see encounter dated 09/23/2020  Location of the provider (ex: office, home):  [place]  Name of any referring provider:Reed, Tiffany, DO  Names of all persons participating in the telemedicine service and their role in the encounter: Sherrie Mustache, Nurse Practitioner, Carroll Kinds, CMA, and patient.   Time spent on call: 14 minutes with medical assistant

## 2020-09-23 NOTE — Patient Instructions (Signed)
[name] , Thank you for taking time to come for your Medicare Wellness Visit. I appreciate your ongoing commitment to your health goals. Please review the following plan we discussed and let me know if I can assist you in the future.   Screening recommendations/referrals: Colonoscopy aged out Mammogram aged out Bone Density ordered today- to call [facility] to schedule [phone] Recommended yearly ophthalmology/optometry visit for glaucoma screening and checkup Recommended yearly dental visit for hygiene and checkup  Vaccinations: Influenza vaccine recommended yearly Pneumococcal vaccine up to date Tdap vaccine recommended to get at your local pharmacy  Shingles vaccine up to date  Advanced directives: on file.   Conditions/risks identified: fall risk, debility, advance age  Next appointment: 1 year   Preventive Care 35 Years and Older, Female Preventive care refers to lifestyle choices and visits with your health care provider that can promote health and wellness. What does preventive care include?  A yearly physical exam. This is also called an annual well check.  Dental exams once or twice a year.  Routine eye exams. Ask your health care provider how often you should have your eyes checked.  Personal lifestyle choices, including:  Daily care of your teeth and gums.  Regular physical activity.  Eating a healthy diet.  Avoiding tobacco and drug use.  Limiting alcohol use.  Practicing safe sex.  Taking low-dose aspirin every day.  Taking vitamin and mineral supplements as recommended by your health care provider. What happens during an annual well check? The services and screenings done by your health care provider during your annual well check will depend on your age, overall health, lifestyle risk factors, and family history of disease. Counseling  Your health care provider may ask you questions about your:  Alcohol use.  Tobacco use.  Drug  use.  Emotional well-being.  Home and relationship well-being.  Sexual activity.  Eating habits.  History of falls.  Memory and ability to understand (cognition).  Work and work Statistician.  Reproductive health. Screening  You may have the following tests or measurements:  Height, weight, and BMI.  Blood pressure.  Lipid and cholesterol levels. These may be checked every 5 years, or more frequently if you are over 88 years old.  Skin check.  Lung cancer screening. You may have this screening every year starting at age 64 if you have a 30-pack-year history of smoking and currently smoke or have quit within the past 15 years.  Fecal occult blood test (FOBT) of the stool. You may have this test every year starting at age 83.  Flexible sigmoidoscopy or colonoscopy. You may have a sigmoidoscopy every 5 years or a colonoscopy every 10 years starting at age 67.  Hepatitis C blood test.  Hepatitis B blood test.  Sexually transmitted disease (STD) testing.  Diabetes screening. This is done by checking your blood sugar (glucose) after you have not eaten for a while (fasting). You may have this done every 1-3 years.  Bone density scan. This is done to screen for osteoporosis. You may have this done starting at age 24.  Mammogram. This may be done every 1-2 years. Talk to your health care provider about how often you should have regular mammograms. Talk with your health care provider about your test results, treatment options, and if necessary, the need for more tests. Vaccines  Your health care provider may recommend certain vaccines, such as:  Influenza vaccine. This is recommended every year.  Tetanus, diphtheria, and acellular pertussis (Tdap, Td)  vaccine. You may need a Td booster every 10 years.  Zoster vaccine. You may need this after age 35.  Pneumococcal 13-valent conjugate (PCV13) vaccine. One dose is recommended after age 32.  Pneumococcal polysaccharide  (PPSV23) vaccine. One dose is recommended after age 60. Talk to your health care provider about which screenings and vaccines you need and how often you need them. This information is not intended to replace advice given to you by your health care provider. Make sure you discuss any questions you have with your health care provider. Document Released: 11/19/2015 Document Revised: 07/12/2016 Document Reviewed: 08/24/2015 Elsevier Interactive Patient Education  2017 [employer] Prevention in the Home Falls can cause injuries. They can happen to people of all ages. There are many things you can do to make your home safe and to help prevent falls. What can I do on the outside of my home?  Regularly fix the edges of walkways and driveways and fix any cracks.  Remove anything that might make you trip as you walk through a door, such as a raised step or threshold.  Trim any bushes or trees on the path to your home.  Use bright outdoor lighting.  Clear any walking paths of anything that might make someone trip, such as rocks or tools.  Regularly check to see if handrails are loose or broken. Make sure that both sides of any steps have handrails.  Any raised decks and porches should have guardrails on the edges.  Have any leaves, snow, or ice cleared regularly.  Use sand or salt on walking paths during winter.  Clean up any spills in your garage right away. This includes oil or grease spills. What can I do in the bathroom?  Use night lights.  Install grab bars by the toilet and in the tub and shower. Do not use towel bars as grab bars.  Use non-skid mats or decals in the tub or shower.  If you need to sit down in the shower, use a plastic, non-slip stool.  Keep the floor dry. Clean up any water that spills on the floor as soon as it happens.  Remove soap buildup in the tub or shower regularly.  Attach bath mats securely with double-sided non-slip rug tape.  Do not have  throw rugs and other things on the floor that can make you trip. What can I do in the bedroom?  Use night lights.  Make sure that you have a light by your bed that is easy to reach.  Do not use any sheets or blankets that are too big for your bed. They should not hang down onto the floor.  Have a firm chair that has side arms. You can use this for support while you get dressed.  Do not have throw rugs and other things on the floor that can make you trip. What can I do in the kitchen?  Clean up any spills right away.  Avoid walking on wet floors.  Keep items that you use a lot in easy-to-reach places.  If you need to reach something above you, use a strong step stool that has a grab bar.  Keep electrical cords out of the way.  Do not use floor polish or wax that makes floors slippery. If you must use wax, use non-skid floor wax.  Do not have throw rugs and other things on the floor that can make you trip. What can I do with my stairs?  Do not leave  any items on the stairs.  Make sure that there are handrails on both sides of the stairs and use them. Fix handrails that are broken or loose. Make sure that handrails are as long as the stairways.  Check any carpeting to make sure that it is firmly attached to the stairs. Fix any carpet that is loose or worn.  Avoid having throw rugs at the top or bottom of the stairs. If you do have throw rugs, attach them to the floor with carpet tape.  Make sure that you have a light switch at the top of the stairs and the bottom of the stairs. If you do not have them, ask someone to add them for you. What else can I do to help prevent falls?  Wear shoes that:  Do not have high heels.  Have rubber bottoms.  Are comfortable and fit you well.  Are closed at the toe. Do not wear sandals.  If you use a stepladder:  Make sure that it is fully opened. Do not climb a closed stepladder.  Make sure that both sides of the stepladder are  locked into place.  Ask someone to hold it for you, if possible.  Clearly mark and make sure that you can see:  Any grab bars or handrails.  First and last steps.  Where the edge of each step is.  Use tools that help you move around (mobility aids) if they are needed. These include:  Canes.  Walkers.  Scooters.  Crutches.  Turn on the lights when you go into a dark area. Replace any light bulbs as soon as they burn out.  Set up your furniture so you have a clear path. Avoid moving your furniture around.  If any of your floors are uneven, fix them.  If there are any pets around you, be aware of where they are.  Review your medicines with your doctor. Some medicines can make you feel dizzy. This can increase your chance of falling. Ask your doctor what other things that you can do to help prevent falls. This information is not intended to replace advice given to you by your health care provider. Make sure you discuss any questions you have with your health care provider. Document Released: 08/19/2009 Document Revised: 03/30/2016 Document Reviewed: 11/27/2014 Elsevier Interactive Patient Education  2017 [employer].

## 2020-09-24 DIAGNOSIS — I6389 Other cerebral infarction: Secondary | ICD-10-CM | POA: Diagnosis not present

## 2020-09-24 DIAGNOSIS — R278 Other lack of coordination: Secondary | ICD-10-CM | POA: Diagnosis not present

## 2020-09-24 DIAGNOSIS — M62562 Muscle wasting and atrophy, not elsewhere classified, left lower leg: Secondary | ICD-10-CM | POA: Diagnosis not present

## 2020-09-24 DIAGNOSIS — M6389 Disorders of muscle in diseases classified elsewhere, multiple sites: Secondary | ICD-10-CM | POA: Diagnosis not present

## 2020-09-24 DIAGNOSIS — I69391 Dysphagia following cerebral infarction: Secondary | ICD-10-CM | POA: Diagnosis not present

## 2020-09-24 DIAGNOSIS — R2689 Other abnormalities of gait and mobility: Secondary | ICD-10-CM | POA: Diagnosis not present

## 2020-09-27 DIAGNOSIS — R2689 Other abnormalities of gait and mobility: Secondary | ICD-10-CM | POA: Diagnosis not present

## 2020-09-27 DIAGNOSIS — R278 Other lack of coordination: Secondary | ICD-10-CM | POA: Diagnosis not present

## 2020-09-27 DIAGNOSIS — M6389 Disorders of muscle in diseases classified elsewhere, multiple sites: Secondary | ICD-10-CM | POA: Diagnosis not present

## 2020-09-27 DIAGNOSIS — I6389 Other cerebral infarction: Secondary | ICD-10-CM | POA: Diagnosis not present

## 2020-09-27 DIAGNOSIS — I69391 Dysphagia following cerebral infarction: Secondary | ICD-10-CM | POA: Diagnosis not present

## 2020-09-27 DIAGNOSIS — M62562 Muscle wasting and atrophy, not elsewhere classified, left lower leg: Secondary | ICD-10-CM | POA: Diagnosis not present

## 2020-09-28 DIAGNOSIS — R2689 Other abnormalities of gait and mobility: Secondary | ICD-10-CM | POA: Diagnosis not present

## 2020-09-28 DIAGNOSIS — M6389 Disorders of muscle in diseases classified elsewhere, multiple sites: Secondary | ICD-10-CM | POA: Diagnosis not present

## 2020-09-28 DIAGNOSIS — R278 Other lack of coordination: Secondary | ICD-10-CM | POA: Diagnosis not present

## 2020-09-28 DIAGNOSIS — I69391 Dysphagia following cerebral infarction: Secondary | ICD-10-CM | POA: Diagnosis not present

## 2020-09-28 DIAGNOSIS — I6389 Other cerebral infarction: Secondary | ICD-10-CM | POA: Diagnosis not present

## 2020-09-28 DIAGNOSIS — M62562 Muscle wasting and atrophy, not elsewhere classified, left lower leg: Secondary | ICD-10-CM | POA: Diagnosis not present

## 2020-09-29 DIAGNOSIS — I6389 Other cerebral infarction: Secondary | ICD-10-CM | POA: Diagnosis not present

## 2020-09-29 DIAGNOSIS — R278 Other lack of coordination: Secondary | ICD-10-CM | POA: Diagnosis not present

## 2020-09-29 DIAGNOSIS — M62562 Muscle wasting and atrophy, not elsewhere classified, left lower leg: Secondary | ICD-10-CM | POA: Diagnosis not present

## 2020-09-29 DIAGNOSIS — M6389 Disorders of muscle in diseases classified elsewhere, multiple sites: Secondary | ICD-10-CM | POA: Diagnosis not present

## 2020-09-29 DIAGNOSIS — R2689 Other abnormalities of gait and mobility: Secondary | ICD-10-CM | POA: Diagnosis not present

## 2020-09-29 DIAGNOSIS — I69391 Dysphagia following cerebral infarction: Secondary | ICD-10-CM | POA: Diagnosis not present

## 2020-10-01 DIAGNOSIS — R2689 Other abnormalities of gait and mobility: Secondary | ICD-10-CM | POA: Diagnosis not present

## 2020-10-01 DIAGNOSIS — I69391 Dysphagia following cerebral infarction: Secondary | ICD-10-CM | POA: Diagnosis not present

## 2020-10-01 DIAGNOSIS — I6389 Other cerebral infarction: Secondary | ICD-10-CM | POA: Diagnosis not present

## 2020-10-01 DIAGNOSIS — R278 Other lack of coordination: Secondary | ICD-10-CM | POA: Diagnosis not present

## 2020-10-01 DIAGNOSIS — M6389 Disorders of muscle in diseases classified elsewhere, multiple sites: Secondary | ICD-10-CM | POA: Diagnosis not present

## 2020-10-01 DIAGNOSIS — M62562 Muscle wasting and atrophy, not elsewhere classified, left lower leg: Secondary | ICD-10-CM | POA: Diagnosis not present

## 2020-10-04 DIAGNOSIS — M6389 Disorders of muscle in diseases classified elsewhere, multiple sites: Secondary | ICD-10-CM | POA: Diagnosis not present

## 2020-10-04 DIAGNOSIS — I6389 Other cerebral infarction: Secondary | ICD-10-CM | POA: Diagnosis not present

## 2020-10-04 DIAGNOSIS — R278 Other lack of coordination: Secondary | ICD-10-CM | POA: Diagnosis not present

## 2020-10-04 DIAGNOSIS — M62562 Muscle wasting and atrophy, not elsewhere classified, left lower leg: Secondary | ICD-10-CM | POA: Diagnosis not present

## 2020-10-04 DIAGNOSIS — R2689 Other abnormalities of gait and mobility: Secondary | ICD-10-CM | POA: Diagnosis not present

## 2020-10-04 DIAGNOSIS — I69391 Dysphagia following cerebral infarction: Secondary | ICD-10-CM | POA: Diagnosis not present

## 2020-10-05 DIAGNOSIS — I6389 Other cerebral infarction: Secondary | ICD-10-CM | POA: Diagnosis not present

## 2020-10-05 DIAGNOSIS — R278 Other lack of coordination: Secondary | ICD-10-CM | POA: Diagnosis not present

## 2020-10-05 DIAGNOSIS — R2689 Other abnormalities of gait and mobility: Secondary | ICD-10-CM | POA: Diagnosis not present

## 2020-10-05 DIAGNOSIS — M6389 Disorders of muscle in diseases classified elsewhere, multiple sites: Secondary | ICD-10-CM | POA: Diagnosis not present

## 2020-10-05 DIAGNOSIS — I69391 Dysphagia following cerebral infarction: Secondary | ICD-10-CM | POA: Diagnosis not present

## 2020-10-05 DIAGNOSIS — M62562 Muscle wasting and atrophy, not elsewhere classified, left lower leg: Secondary | ICD-10-CM | POA: Diagnosis not present

## 2020-10-06 DIAGNOSIS — I69391 Dysphagia following cerebral infarction: Secondary | ICD-10-CM | POA: Diagnosis not present

## 2020-10-06 DIAGNOSIS — M84350S Stress fracture, pelvis, sequela: Secondary | ICD-10-CM | POA: Diagnosis not present

## 2020-10-06 DIAGNOSIS — I6389 Other cerebral infarction: Secondary | ICD-10-CM | POA: Diagnosis not present

## 2020-10-06 DIAGNOSIS — R2689 Other abnormalities of gait and mobility: Secondary | ICD-10-CM | POA: Diagnosis not present

## 2020-10-06 DIAGNOSIS — Z9181 History of falling: Secondary | ICD-10-CM | POA: Diagnosis not present

## 2020-10-06 DIAGNOSIS — M6389 Disorders of muscle in diseases classified elsewhere, multiple sites: Secondary | ICD-10-CM | POA: Diagnosis not present

## 2020-10-06 DIAGNOSIS — M62562 Muscle wasting and atrophy, not elsewhere classified, left lower leg: Secondary | ICD-10-CM | POA: Diagnosis not present

## 2020-10-06 DIAGNOSIS — R278 Other lack of coordination: Secondary | ICD-10-CM | POA: Diagnosis not present

## 2020-10-07 DIAGNOSIS — M6389 Disorders of muscle in diseases classified elsewhere, multiple sites: Secondary | ICD-10-CM | POA: Diagnosis not present

## 2020-10-07 DIAGNOSIS — I69391 Dysphagia following cerebral infarction: Secondary | ICD-10-CM | POA: Diagnosis not present

## 2020-10-07 DIAGNOSIS — I6389 Other cerebral infarction: Secondary | ICD-10-CM | POA: Diagnosis not present

## 2020-10-07 DIAGNOSIS — M62562 Muscle wasting and atrophy, not elsewhere classified, left lower leg: Secondary | ICD-10-CM | POA: Diagnosis not present

## 2020-10-07 DIAGNOSIS — R2689 Other abnormalities of gait and mobility: Secondary | ICD-10-CM | POA: Diagnosis not present

## 2020-10-07 DIAGNOSIS — R278 Other lack of coordination: Secondary | ICD-10-CM | POA: Diagnosis not present

## 2020-10-08 DIAGNOSIS — I69391 Dysphagia following cerebral infarction: Secondary | ICD-10-CM | POA: Diagnosis not present

## 2020-10-08 DIAGNOSIS — I6389 Other cerebral infarction: Secondary | ICD-10-CM | POA: Diagnosis not present

## 2020-10-08 DIAGNOSIS — R2689 Other abnormalities of gait and mobility: Secondary | ICD-10-CM | POA: Diagnosis not present

## 2020-10-08 DIAGNOSIS — M6389 Disorders of muscle in diseases classified elsewhere, multiple sites: Secondary | ICD-10-CM | POA: Diagnosis not present

## 2020-10-08 DIAGNOSIS — R278 Other lack of coordination: Secondary | ICD-10-CM | POA: Diagnosis not present

## 2020-10-08 DIAGNOSIS — M62562 Muscle wasting and atrophy, not elsewhere classified, left lower leg: Secondary | ICD-10-CM | POA: Diagnosis not present

## 2020-10-11 ENCOUNTER — Encounter: Payer: Self-pay | Admitting: Adult Health

## 2020-10-11 ENCOUNTER — Non-Acute Institutional Stay (SKILLED_NURSING_FACILITY): Payer: Medicare Other | Admitting: Adult Health

## 2020-10-11 DIAGNOSIS — I10 Essential (primary) hypertension: Secondary | ICD-10-CM | POA: Diagnosis not present

## 2020-10-11 DIAGNOSIS — Z20828 Contact with and (suspected) exposure to other viral communicable diseases: Secondary | ICD-10-CM | POA: Diagnosis not present

## 2020-10-11 DIAGNOSIS — R1312 Dysphagia, oropharyngeal phase: Secondary | ICD-10-CM | POA: Diagnosis not present

## 2020-10-11 DIAGNOSIS — D6869 Other thrombophilia: Secondary | ICD-10-CM | POA: Diagnosis not present

## 2020-10-11 DIAGNOSIS — I69354 Hemiplegia and hemiparesis following cerebral infarction affecting left non-dominant side: Secondary | ICD-10-CM

## 2020-10-11 DIAGNOSIS — I4821 Permanent atrial fibrillation: Secondary | ICD-10-CM | POA: Diagnosis not present

## 2020-10-11 DIAGNOSIS — I4891 Unspecified atrial fibrillation: Secondary | ICD-10-CM | POA: Diagnosis not present

## 2020-10-11 DIAGNOSIS — R509 Fever, unspecified: Secondary | ICD-10-CM | POA: Diagnosis not present

## 2020-10-11 DIAGNOSIS — Z9189 Other specified personal risk factors, not elsewhere classified: Secondary | ICD-10-CM | POA: Diagnosis not present

## 2020-10-11 NOTE — Progress Notes (Signed)
Location:  Occupational psychologist of Service:  SNF (31) Provider:   Cindi Conway, ANP [place] [phone]   Susan Curry, DO  Patient Care Team: Susan Curry, DO as PCP - General (Geriatric Medicine) Susan Lance, MD as PCP - Electrophysiology (Cardiology) Susan Miner, MD as Consulting Physician (Dermatology) Susan Lance, MD as Consulting Physician (Cardiology) [facility] Susan Finner, MD as Consulting Physician (Dermatology)  Extended Emergency Contact Information Primary Emergency Contact: Susan Conway of [place] Phone: [phone] Mobile Phone: [phone] Relation: Daughter Secondary Emergency Contact: Susan Conway  Susan Conway of [place] Phone: [phone] Relation: Son  Code Status:  DNR Goals of care: Advanced Directive information Advanced Directives 09/23/2020  Does Patient Have a Medical Advance Directive? Yes  Type of Paramedic of [place];Out of facility DNR (pink MOST or yellow form)  Does patient want to make changes to medical advance directive? No - Patient declined  Copy of [place] in Chart? Yes - validated most recent copy scanned in chart (See row information)  Pre-existing out of facility DNR order (yellow form or pink MOST form) Yellow form placed in chart (order not valid for inpatient use)     Chief Complaint  Patient presents with  . Acute Visit    fever 102    HPI:  Pt is a 86 y.o. female seen today for an acute visit for fever of 102. The nurse filled out on SBAR indicating she had a fever of 102 on Saturday night 12/4 and received tylenol. Later on 12/5 it was 101 and as of 12/6 the temp is 99.3.  The patients denies any symptoms of feeling unwell. No cough, sore throat, body aches, dysuria, etc. She is vaccinated for covid and received her boost in November. 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 She fell and ha d left pelvic fracture with sacral hematoma but denies any pain at this time. She has been working with therapy and is able to stand and walk short distances with assistance. She continues with left arm weakness but is right handed. She is on a D3 diet with thin liquids and is tolerating this well.   BP is in the 150-160 range at times. Has prn hydralaziine but has not needed it.   Hgb 9.6 WBC 13.9 on 10/25  Past Medical History:  Diagnosis Date  . Actinic keratosis 05/25/2014  . Anxiety   . Atrial fibrillation ([place]) 09/21/2010  . Basal cell carcinoma 05/25/2014   Multiple removed by Dr. Danella Sensing in March 2015: right neck, left neck, scalp   . Bell's palsy 07/18/1979  . Cervicalgia 01/31/2012  . Closed fracture of lumbar vertebra without mention of spinal cord injury 07/17/2005  . Conjunctiva disorder 12/26/2010  . Coronary atherosclerosis of native coronary artery 07/17/1997  . Cramp of limb 08/16/2009  . Degeneration of lumbar or lumbosacral intervertebral disc 01/31/2012  . Disturbance of skin sensation 08/2009  . Dizziness and giddiness 11/08/2011   vertigo  . External hemorrhoids without mention of complication 7/90/2409  . External hemorrhoids without mention of complication 73/53/2992  . Ganglion of tendon sheath 08/16/2009  . Leg cramp 10/27/2013   Most frequently the right leg.   . Long term (current) use of anticoagulants 09/2010  . Lumbago 07/2009  . Meralgia paresthetica 07/2007  . MI, old   . Myalgia and  myositis, unspecified 01/17/2012  . Osteoporosis   . Other abnormal blood chemistry 04/08/2012  . Other abnormal blood chemistry 2013   hyperglycemia  . Other disorder of muscle, ligament, and fascia 04/08/2012  . Other specified cardiac dysrhythmias(427.89) 06/27/2010  . Pacemaker 05/06/2020  . Pain in joint, ankle and foot 10/27/2013   Bilateral since 1998   . Pain in joint,  shoulder region 08/12/2012  . Pain in joint, upper arm 12/26/2010  . Pain in limb 01/11/2011  . Pain in thoracic spine 01/31/2012  . 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 PVC's (premature ventricular contractions)   . Rash and other nonspecific skin eruption 08/02/2011  . Senile osteoporosis 07/17/1993  . Stroke ([place])   . Unspecified essential hypertension 07/17/1997  . Varicose veins of lower extremities 08/16/2009  . Varicose veins of lower extremities 08/16/2009   Past Surgical History:  Procedure Laterality Date  . COLONOSCOPY WITH PROPOFOL N/A 10/19/2015   Procedure: COLONOSCOPY WITH PROPOFOL;  Surgeon: Gatha Mayer, MD;  Location: [facility];  Service: Endoscopy;  Laterality: N/A;  . CORONARY ARTERY BYPASS GRAFT  1998   x2; LIMA to LAD; SVG to diagonal off bypass  . HEMORRHOID SURGERY  08/26/2012   Dr. Brantley Stage  . LOOP RECORDER INSERTION N/A 05/13/2018   Procedure: LOOP RECORDER INSERTION;  Surgeon: Susan Lance, MD;  Location: [facility];  Service: Cardiovascular;  Laterality: N/A;  . LOOP RECORDER REMOVAL N/A 05/26/2019   Procedure: LOOP RECORDER REMOVAL;  Surgeon: Susan Lance, MD;  Location: [facility];  Service: Cardiovascular;  Laterality: N/A;  . MASS EXCISION Left 11/23/2015   Procedure: LEFT WRIST EXCISION CYST;  Surgeon: Leanora Cover, MD;  Location: [facility];  Service: Orthopedics;  Laterality: Left;  . PACEMAKER IMPLANT N/A 05/26/2019   Procedure: PACEMAKER IMPLANT;  Surgeon: Susan Lance, MD;  Location: [facility];  Service: Cardiovascular;  Laterality: N/A;  . SKIN BIOPSY  01/29/14   (R) neck; (R) scalp, 2 (L) neck; shave biopsy Superficial basal cell carcinoma Dr. Danella Sensing  . TONSILLECTOMY  1940's    Allergies  Allergen Reactions  . Iodinated Diagnostic Agents Nausea Only and Other (See Comments)    Severe nausea and also passed out  . Bactrim Rash  . Relafen [Nabumetone] Rash  . Sulfanilamide Rash     Outpatient Encounter Medications as of 10/11/2020  Medication Sig  . acetaminophen (TYLENOL) 500 MG tablet Take 1,000 mg by mouth every 6 (six) hours as needed for mild pain (pain.).   Marland Kitchen aspirin EC 81 MG EC tablet Take 1 tablet (81 mg total) by mouth daily. Swallow whole.  Marland Kitchen atenolol (TENORMIN) 25 MG tablet Take 1 tablet (25 mg total) by mouth 2 (two) times daily.  . calcium-vitamin D (OSCAL WITH D) 500-200 MG-UNIT tablet Take 1 tablet by mouth daily.  . diclofenac Sodium (VOLTAREN) 1 % GEL Apply 4 g topically 4 (four) times daily as needed. Left knee  . digoxin (LANOXIN) 0.125 MG tablet Take 0.125 mg by mouth daily.  Marland Kitchen LORazepam (ATIVAN) 1 MG tablet Take 1 tablet (1 mg total) by mouth at bedtime.  . ondansetron (ZOFRAN) 4 MG tablet Take 1 tablet (4 mg total) by mouth every 6 (six) hours as needed for nausea.  . polyethylene glycol (MIRALAX / GLYCOLAX) 17 g packet Take 17 g by mouth daily.  . traMADol (ULTRAM) 50 MG tablet Take 1 tablet (50 mg total) by mouth every 6 (six) hours  as needed for moderate pain or severe pain.  . [DISCONTINUED] digoxin (LANOXIN) 0.125 MG tablet Take 1 tablet (125 mcg total) by mouth every other day.  . [DISCONTINUED] lidocaine (LIDODERM) 5 % Place 1 patch onto the skin daily. Remove & Discard patch within 12 hours or as directed by MD  . [DISCONTINUED] methocarbamol (ROBAXIN) 500 MG tablet Take 1 tablet (500 mg total) by mouth every 6 (six) hours as needed for muscle spasms.   No facility-administered encounter medications on file as of 10/11/2020.    Review of Systems  Constitutional: Positive for fever. Negative for activity change, appetite change, chills, diaphoresis, fatigue and unexpected weight change.  HENT: Positive for trouble swallowing (improved). Negative for congestion, facial swelling, postnasal drip, rhinorrhea, sore throat, tinnitus and voice change.   Respiratory: Negative for cough, shortness of breath and wheezing.   Cardiovascular: Negative  for chest pain, palpitations and leg swelling.  Gastrointestinal: Negative for abdominal distention, abdominal pain, constipation and diarrhea.  Genitourinary: Negative for decreased urine volume, difficulty urinating, dysuria and pelvic pain.  Musculoskeletal: Positive for gait problem. Negative for arthralgias, back pain, joint swelling and myalgias.  Neurological: Positive for weakness. Negative for dizziness, tremors, seizures, syncope, facial asymmetry, speech difficulty, light-headedness, numbness and headaches.  Psychiatric/Behavioral: Negative for agitation, behavioral problems and confusion.    Immunization History  Administered Date(s) Administered  . Influenza Whole 08/06/2012  . Influenza, High Dose Seasonal PF 08/15/2019  . Influenza,inj,Quad PF,6+ Mos 08/30/2018  . Influenza-Unspecified 08/06/2013, 08/24/2014, 08/26/2015, 08/31/2016, 08/27/2017  . Moderna SARS-COVID-2 Vaccination 11/16/2019, 12/16/2019  . Pneumococcal Conjugate-13 09/22/2015  . Pneumococcal Polysaccharide-23 08/29/2006  . Td 10/07/2007  . Zoster 03/12/2008  . Zoster Recombinat (Shingrix) 02/12/2018, 05/13/2018   Pertinent  Health Maintenance Due  Topic Date Due  . INFLUENZA VACCINE  06/06/2020  . DEXA SCAN  Completed  . PNA vac Low Risk Adult  Completed   Fall Risk  09/23/2020 08/26/2020 07/28/2020 02/25/2020 01/28/2020  Falls in the past year? 1 1 0 0 0  Number falls in past yr: 0 0 1 0 0  Comment - - - - -  Injury with Fall? 1 1 0 0 0  Comment Patient fractured pelvis and had a stroke - - - -  Risk for fall due to : - History of fall(s);Impaired balance/gait;Impaired mobility;Orthopedic patient - - -  Follow up - Falls evaluation completed;Education provided;Falls prevention discussed;Follow up appointment - - -   Functional Status Survey:    Vitals:   10/11/20 1219  BP: (!) 144/79  Pulse: (!) 110  Resp: 18  Temp: 99.3 F (37.4 C)  SpO2: 98%   There is no height or weight on file to  calculate BMI. Physical Exam Vitals and nursing note reviewed.  Constitutional:      General: She is not in acute distress.    Appearance: She is not diaphoretic.  HENT:     Head: Normocephalic and atraumatic.     Nose: Congestion present.     Mouth/Throat:     Mouth: Mucous membranes are moist.     Pharynx: Oropharynx is clear.     Comments: dysarthria Eyes:     General:        Right eye: No discharge.        Left eye: No discharge.     Conjunctiva/sclera: Conjunctivae normal.     Pupils: Pupils are equal, round, and reactive to light.  Neck:     Vascular: No carotid bruit or JVD.  Cardiovascular:  Rate and Rhythm: Normal rate and regular rhythm.     Heart sounds: No murmur heard.   Pulmonary:     Effort: Pulmonary effort is normal. No respiratory distress.     Breath sounds: Normal breath sounds. No wheezing.     Comments: Very fine rales in both bases  Abdominal:     General: Abdomen is flat. Bowel sounds are normal. There is no distension.     Palpations: Abdomen is soft.     Tenderness: There is no abdominal tenderness.  Genitourinary:    Rectum: Guaiac result negative.  Musculoskeletal:     Cervical back: No rigidity or tenderness.  Lymphadenopathy:     Cervical: No cervical adenopathy.  Skin:    General: Skin is warm and dry.  Neurological:     Mental Status: She is alert and oriented to person, place, and time.     Comments: Left sided weakness   Psychiatric:        Mood and Affect: Mood normal.     Labs reviewed: Recent Labs    03/04/20 0955 07/12/20 0000 07/22/20 0000 08/17/20 0731 08/18/20 0255  NA   < >  --  142 142 137  K   < >  --  4.1 3.7 3.9  CL   < >  --  104 105 106  CO2   < >  --  29* 24 23  GLUCOSE  --   --   --  112* 134*  BUN   < >  --  26* 24* 21  CREATININE   < >  --  1.0 0.89 1.01*  CALCIUM  --  9.8  --  9.3 8.7*   < > = values in this interval not displayed.   Recent Labs    03/04/20 0000 03/04/20 0955 08/17/20 0731  08/18/20 0255  AST  --  20 32 26  ALT  --  15 24 21   ALKPHOS  --   --  62 53  BILITOT  --   --  1.2 1.2  PROT  --   --  6.5 6.1*  ALBUMIN 3.4*  --  3.7 3.4*   Recent Labs    08/17/20 0731 08/17/20 0731 08/17/20 1322 08/17/20 1322 08/18/20 0255 08/18/20 0255 08/23/20 0521 08/23/20 1621 08/30/20 0000  WBC 15.5*   < > 12.7*   < > 15.9*  --  16.9*  --  13.9  NEUTROABS 11.9*  --  11.1*  --   --   --   --   --  10.40  HGB 11.2*   < > 9.7*   < > 9.1*   < > 7.3* 8.8* 9.6*  HCT 35.0*   < > 31.3*   < > 29.2*   < > 24.1* 27.9* 29*  MCV 92.3   < > 93.4  --  94.5  --  97.6  --   --   PLT 84*   < > 81*   < > 81*  --  138*  --  197   < > = values in this interval not displayed.   Lab Results  Component Value Date   TSH 3.80 09/14/2015   Lab Results  Component Value Date   HGBA1C 5.7 (H) 08/19/2020   Lab Results  Component Value Date   CHOL 166 07/22/2020   HDL 53 07/22/2020   LDLCALC 106 03/25/2019   LDLDIRECT 81.3 08/19/2020   TRIG 54 07/22/2020   CHOLHDL 2 09/12/2010  Significant Diagnostic Results in last 30 days:  No results found.  Assessment/Plan 1. Fever, unspecified fever cause She is asymptomatic with temp trending down. Will check covid swab. Labs ordered as well.  Enhanced isolation and continued vital sign monitoring.   2. Permanent atrial fibrillation (HCC) Rate is controlled Continue atenolol and digoxin   3. Hypercoagulable state due to atrial fibrillation (HCC) Off anticoagulation due to bakers cysts rupture and bleeding. Continue baby aspirin.   4. Essential hypertension Slightly elevated. May need additional meds but would hold off now until her labs returned  5. 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 Oropharyngeal dysphagia Continue D3 diet with thin liquids and asp prec.     Family/ staff Communication: discussed with the nurse and the resident    Labs/tests ordered:  Covid swab rapid and PCR, CBC and BMP

## 2020-10-12 ENCOUNTER — Encounter: Payer: Self-pay | Admitting: Internal Medicine

## 2020-10-12 DIAGNOSIS — D72829 Elevated white blood cell count, unspecified: Secondary | ICD-10-CM | POA: Diagnosis not present

## 2020-10-12 LAB — CBC AND DIFFERENTIAL
HCT: 36 (ref 36–46)
Hemoglobin: 11.5 — AB (ref 12.0–16.0)
Platelets: 105 — AB (ref 150–399)
WBC: 8.7

## 2020-10-12 LAB — BASIC METABOLIC PANEL
BUN: 18 (ref 4–21)
CO2: 22 (ref 13–22)
Chloride: 101 (ref 99–108)
Creatinine: 0.7 (ref 0.5–1.1)
Glucose: 100
Potassium: 5 (ref 3.4–5.3)
Sodium: 137 (ref 137–147)

## 2020-10-12 LAB — CBC: RBC: 3.96 (ref 3.87–5.11)

## 2020-10-12 LAB — COMPREHENSIVE METABOLIC PANEL: Calcium: 9 (ref 8.7–10.7)

## 2020-10-13 DIAGNOSIS — I69391 Dysphagia following cerebral infarction: Secondary | ICD-10-CM | POA: Diagnosis not present

## 2020-10-13 DIAGNOSIS — M6389 Disorders of muscle in diseases classified elsewhere, multiple sites: Secondary | ICD-10-CM | POA: Diagnosis not present

## 2020-10-13 DIAGNOSIS — R2689 Other abnormalities of gait and mobility: Secondary | ICD-10-CM | POA: Diagnosis not present

## 2020-10-13 DIAGNOSIS — I6389 Other cerebral infarction: Secondary | ICD-10-CM | POA: Diagnosis not present

## 2020-10-13 DIAGNOSIS — M62562 Muscle wasting and atrophy, not elsewhere classified, left lower leg: Secondary | ICD-10-CM | POA: Diagnosis not present

## 2020-10-13 DIAGNOSIS — R278 Other lack of coordination: Secondary | ICD-10-CM | POA: Diagnosis not present

## 2020-10-14 DIAGNOSIS — R278 Other lack of coordination: Secondary | ICD-10-CM | POA: Diagnosis not present

## 2020-10-14 DIAGNOSIS — R2689 Other abnormalities of gait and mobility: Secondary | ICD-10-CM | POA: Diagnosis not present

## 2020-10-14 DIAGNOSIS — I6389 Other cerebral infarction: Secondary | ICD-10-CM | POA: Diagnosis not present

## 2020-10-14 DIAGNOSIS — M6389 Disorders of muscle in diseases classified elsewhere, multiple sites: Secondary | ICD-10-CM | POA: Diagnosis not present

## 2020-10-14 DIAGNOSIS — M62562 Muscle wasting and atrophy, not elsewhere classified, left lower leg: Secondary | ICD-10-CM | POA: Diagnosis not present

## 2020-10-14 DIAGNOSIS — I69391 Dysphagia following cerebral infarction: Secondary | ICD-10-CM | POA: Diagnosis not present

## 2020-10-15 DIAGNOSIS — M62562 Muscle wasting and atrophy, not elsewhere classified, left lower leg: Secondary | ICD-10-CM | POA: Diagnosis not present

## 2020-10-15 DIAGNOSIS — M6389 Disorders of muscle in diseases classified elsewhere, multiple sites: Secondary | ICD-10-CM | POA: Diagnosis not present

## 2020-10-15 DIAGNOSIS — R278 Other lack of coordination: Secondary | ICD-10-CM | POA: Diagnosis not present

## 2020-10-15 DIAGNOSIS — R2689 Other abnormalities of gait and mobility: Secondary | ICD-10-CM | POA: Diagnosis not present

## 2020-10-15 DIAGNOSIS — I6389 Other cerebral infarction: Secondary | ICD-10-CM | POA: Diagnosis not present

## 2020-10-15 DIAGNOSIS — I69391 Dysphagia following cerebral infarction: Secondary | ICD-10-CM | POA: Diagnosis not present

## 2020-10-18 DIAGNOSIS — I6389 Other cerebral infarction: Secondary | ICD-10-CM | POA: Diagnosis not present

## 2020-10-18 DIAGNOSIS — R278 Other lack of coordination: Secondary | ICD-10-CM | POA: Diagnosis not present

## 2020-10-18 DIAGNOSIS — R2689 Other abnormalities of gait and mobility: Secondary | ICD-10-CM | POA: Diagnosis not present

## 2020-10-18 DIAGNOSIS — M62562 Muscle wasting and atrophy, not elsewhere classified, left lower leg: Secondary | ICD-10-CM | POA: Diagnosis not present

## 2020-10-18 DIAGNOSIS — M6389 Disorders of muscle in diseases classified elsewhere, multiple sites: Secondary | ICD-10-CM | POA: Diagnosis not present

## 2020-10-18 DIAGNOSIS — I69391 Dysphagia following cerebral infarction: Secondary | ICD-10-CM | POA: Diagnosis not present

## 2020-10-19 DIAGNOSIS — I69391 Dysphagia following cerebral infarction: Secondary | ICD-10-CM | POA: Diagnosis not present

## 2020-10-19 DIAGNOSIS — R278 Other lack of coordination: Secondary | ICD-10-CM | POA: Diagnosis not present

## 2020-10-19 DIAGNOSIS — R2689 Other abnormalities of gait and mobility: Secondary | ICD-10-CM | POA: Diagnosis not present

## 2020-10-19 DIAGNOSIS — M62562 Muscle wasting and atrophy, not elsewhere classified, left lower leg: Secondary | ICD-10-CM | POA: Diagnosis not present

## 2020-10-19 DIAGNOSIS — M6389 Disorders of muscle in diseases classified elsewhere, multiple sites: Secondary | ICD-10-CM | POA: Diagnosis not present

## 2020-10-19 DIAGNOSIS — I6389 Other cerebral infarction: Secondary | ICD-10-CM | POA: Diagnosis not present

## 2020-10-20 DIAGNOSIS — M6389 Disorders of muscle in diseases classified elsewhere, multiple sites: Secondary | ICD-10-CM | POA: Diagnosis not present

## 2020-10-20 DIAGNOSIS — R2689 Other abnormalities of gait and mobility: Secondary | ICD-10-CM | POA: Diagnosis not present

## 2020-10-20 DIAGNOSIS — I69391 Dysphagia following cerebral infarction: Secondary | ICD-10-CM | POA: Diagnosis not present

## 2020-10-20 DIAGNOSIS — I6389 Other cerebral infarction: Secondary | ICD-10-CM | POA: Diagnosis not present

## 2020-10-20 DIAGNOSIS — M62562 Muscle wasting and atrophy, not elsewhere classified, left lower leg: Secondary | ICD-10-CM | POA: Diagnosis not present

## 2020-10-20 DIAGNOSIS — R278 Other lack of coordination: Secondary | ICD-10-CM | POA: Diagnosis not present

## 2020-10-21 DIAGNOSIS — I6389 Other cerebral infarction: Secondary | ICD-10-CM | POA: Diagnosis not present

## 2020-10-21 DIAGNOSIS — R278 Other lack of coordination: Secondary | ICD-10-CM | POA: Diagnosis not present

## 2020-10-21 DIAGNOSIS — M6389 Disorders of muscle in diseases classified elsewhere, multiple sites: Secondary | ICD-10-CM | POA: Diagnosis not present

## 2020-10-21 DIAGNOSIS — M62562 Muscle wasting and atrophy, not elsewhere classified, left lower leg: Secondary | ICD-10-CM | POA: Diagnosis not present

## 2020-10-21 DIAGNOSIS — R2689 Other abnormalities of gait and mobility: Secondary | ICD-10-CM | POA: Diagnosis not present

## 2020-10-21 DIAGNOSIS — I69391 Dysphagia following cerebral infarction: Secondary | ICD-10-CM | POA: Diagnosis not present

## 2020-10-22 DIAGNOSIS — R2689 Other abnormalities of gait and mobility: Secondary | ICD-10-CM | POA: Diagnosis not present

## 2020-10-22 DIAGNOSIS — I6389 Other cerebral infarction: Secondary | ICD-10-CM | POA: Diagnosis not present

## 2020-10-22 DIAGNOSIS — M6389 Disorders of muscle in diseases classified elsewhere, multiple sites: Secondary | ICD-10-CM | POA: Diagnosis not present

## 2020-10-22 DIAGNOSIS — I69391 Dysphagia following cerebral infarction: Secondary | ICD-10-CM | POA: Diagnosis not present

## 2020-10-22 DIAGNOSIS — R278 Other lack of coordination: Secondary | ICD-10-CM | POA: Diagnosis not present

## 2020-10-22 DIAGNOSIS — M62562 Muscle wasting and atrophy, not elsewhere classified, left lower leg: Secondary | ICD-10-CM | POA: Diagnosis not present

## 2020-10-25 DIAGNOSIS — M6389 Disorders of muscle in diseases classified elsewhere, multiple sites: Secondary | ICD-10-CM | POA: Diagnosis not present

## 2020-10-25 DIAGNOSIS — M62562 Muscle wasting and atrophy, not elsewhere classified, left lower leg: Secondary | ICD-10-CM | POA: Diagnosis not present

## 2020-10-25 DIAGNOSIS — I6389 Other cerebral infarction: Secondary | ICD-10-CM | POA: Diagnosis not present

## 2020-10-25 DIAGNOSIS — R278 Other lack of coordination: Secondary | ICD-10-CM | POA: Diagnosis not present

## 2020-10-25 DIAGNOSIS — I69391 Dysphagia following cerebral infarction: Secondary | ICD-10-CM | POA: Diagnosis not present

## 2020-10-25 DIAGNOSIS — R2689 Other abnormalities of gait and mobility: Secondary | ICD-10-CM | POA: Diagnosis not present

## 2020-10-26 DIAGNOSIS — I6389 Other cerebral infarction: Secondary | ICD-10-CM | POA: Diagnosis not present

## 2020-10-26 DIAGNOSIS — M62562 Muscle wasting and atrophy, not elsewhere classified, left lower leg: Secondary | ICD-10-CM | POA: Diagnosis not present

## 2020-10-26 DIAGNOSIS — R2689 Other abnormalities of gait and mobility: Secondary | ICD-10-CM | POA: Diagnosis not present

## 2020-10-26 DIAGNOSIS — R278 Other lack of coordination: Secondary | ICD-10-CM | POA: Diagnosis not present

## 2020-10-26 DIAGNOSIS — M6389 Disorders of muscle in diseases classified elsewhere, multiple sites: Secondary | ICD-10-CM | POA: Diagnosis not present

## 2020-10-26 DIAGNOSIS — I69391 Dysphagia following cerebral infarction: Secondary | ICD-10-CM | POA: Diagnosis not present

## 2020-10-27 DIAGNOSIS — R2689 Other abnormalities of gait and mobility: Secondary | ICD-10-CM | POA: Diagnosis not present

## 2020-10-27 DIAGNOSIS — I6389 Other cerebral infarction: Secondary | ICD-10-CM | POA: Diagnosis not present

## 2020-10-27 DIAGNOSIS — M62562 Muscle wasting and atrophy, not elsewhere classified, left lower leg: Secondary | ICD-10-CM | POA: Diagnosis not present

## 2020-10-27 DIAGNOSIS — R278 Other lack of coordination: Secondary | ICD-10-CM | POA: Diagnosis not present

## 2020-10-27 DIAGNOSIS — M6389 Disorders of muscle in diseases classified elsewhere, multiple sites: Secondary | ICD-10-CM | POA: Diagnosis not present

## 2020-10-27 DIAGNOSIS — I69391 Dysphagia following cerebral infarction: Secondary | ICD-10-CM | POA: Diagnosis not present

## 2020-10-28 DIAGNOSIS — M62562 Muscle wasting and atrophy, not elsewhere classified, left lower leg: Secondary | ICD-10-CM | POA: Diagnosis not present

## 2020-10-28 DIAGNOSIS — M6389 Disorders of muscle in diseases classified elsewhere, multiple sites: Secondary | ICD-10-CM | POA: Diagnosis not present

## 2020-10-28 DIAGNOSIS — I6389 Other cerebral infarction: Secondary | ICD-10-CM | POA: Diagnosis not present

## 2020-10-28 DIAGNOSIS — I69391 Dysphagia following cerebral infarction: Secondary | ICD-10-CM | POA: Diagnosis not present

## 2020-10-28 DIAGNOSIS — R278 Other lack of coordination: Secondary | ICD-10-CM | POA: Diagnosis not present

## 2020-10-28 DIAGNOSIS — R2689 Other abnormalities of gait and mobility: Secondary | ICD-10-CM | POA: Diagnosis not present

## 2020-10-29 DIAGNOSIS — R2689 Other abnormalities of gait and mobility: Secondary | ICD-10-CM | POA: Diagnosis not present

## 2020-10-29 DIAGNOSIS — R278 Other lack of coordination: Secondary | ICD-10-CM | POA: Diagnosis not present

## 2020-10-29 DIAGNOSIS — I69391 Dysphagia following cerebral infarction: Secondary | ICD-10-CM | POA: Diagnosis not present

## 2020-10-29 DIAGNOSIS — M6389 Disorders of muscle in diseases classified elsewhere, multiple sites: Secondary | ICD-10-CM | POA: Diagnosis not present

## 2020-10-29 DIAGNOSIS — M62562 Muscle wasting and atrophy, not elsewhere classified, left lower leg: Secondary | ICD-10-CM | POA: Diagnosis not present

## 2020-10-29 DIAGNOSIS — I6389 Other cerebral infarction: Secondary | ICD-10-CM | POA: Diagnosis not present

## 2020-11-01 DIAGNOSIS — M6389 Disorders of muscle in diseases classified elsewhere, multiple sites: Secondary | ICD-10-CM | POA: Diagnosis not present

## 2020-11-01 DIAGNOSIS — R278 Other lack of coordination: Secondary | ICD-10-CM | POA: Diagnosis not present

## 2020-11-01 DIAGNOSIS — R2689 Other abnormalities of gait and mobility: Secondary | ICD-10-CM | POA: Diagnosis not present

## 2020-11-01 DIAGNOSIS — M62562 Muscle wasting and atrophy, not elsewhere classified, left lower leg: Secondary | ICD-10-CM | POA: Diagnosis not present

## 2020-11-01 DIAGNOSIS — I69391 Dysphagia following cerebral infarction: Secondary | ICD-10-CM | POA: Diagnosis not present

## 2020-11-01 DIAGNOSIS — I6389 Other cerebral infarction: Secondary | ICD-10-CM | POA: Diagnosis not present

## 2020-11-02 DIAGNOSIS — R278 Other lack of coordination: Secondary | ICD-10-CM | POA: Diagnosis not present

## 2020-11-02 DIAGNOSIS — R2689 Other abnormalities of gait and mobility: Secondary | ICD-10-CM | POA: Diagnosis not present

## 2020-11-02 DIAGNOSIS — I69391 Dysphagia following cerebral infarction: Secondary | ICD-10-CM | POA: Diagnosis not present

## 2020-11-02 DIAGNOSIS — M6389 Disorders of muscle in diseases classified elsewhere, multiple sites: Secondary | ICD-10-CM | POA: Diagnosis not present

## 2020-11-02 DIAGNOSIS — M62562 Muscle wasting and atrophy, not elsewhere classified, left lower leg: Secondary | ICD-10-CM | POA: Diagnosis not present

## 2020-11-02 DIAGNOSIS — I6389 Other cerebral infarction: Secondary | ICD-10-CM | POA: Diagnosis not present

## 2020-11-03 DIAGNOSIS — M62562 Muscle wasting and atrophy, not elsewhere classified, left lower leg: Secondary | ICD-10-CM | POA: Diagnosis not present

## 2020-11-03 DIAGNOSIS — I6389 Other cerebral infarction: Secondary | ICD-10-CM | POA: Diagnosis not present

## 2020-11-03 DIAGNOSIS — M6389 Disorders of muscle in diseases classified elsewhere, multiple sites: Secondary | ICD-10-CM | POA: Diagnosis not present

## 2020-11-03 DIAGNOSIS — R2689 Other abnormalities of gait and mobility: Secondary | ICD-10-CM | POA: Diagnosis not present

## 2020-11-03 DIAGNOSIS — R278 Other lack of coordination: Secondary | ICD-10-CM | POA: Diagnosis not present

## 2020-11-03 DIAGNOSIS — I69391 Dysphagia following cerebral infarction: Secondary | ICD-10-CM | POA: Diagnosis not present

## 2020-11-04 DIAGNOSIS — R2689 Other abnormalities of gait and mobility: Secondary | ICD-10-CM | POA: Diagnosis not present

## 2020-11-04 DIAGNOSIS — I69391 Dysphagia following cerebral infarction: Secondary | ICD-10-CM | POA: Diagnosis not present

## 2020-11-04 DIAGNOSIS — M62562 Muscle wasting and atrophy, not elsewhere classified, left lower leg: Secondary | ICD-10-CM | POA: Diagnosis not present

## 2020-11-04 DIAGNOSIS — M6389 Disorders of muscle in diseases classified elsewhere, multiple sites: Secondary | ICD-10-CM | POA: Diagnosis not present

## 2020-11-04 DIAGNOSIS — R278 Other lack of coordination: Secondary | ICD-10-CM | POA: Diagnosis not present

## 2020-11-04 DIAGNOSIS — I6389 Other cerebral infarction: Secondary | ICD-10-CM | POA: Diagnosis not present

## 2020-11-05 DIAGNOSIS — R2689 Other abnormalities of gait and mobility: Secondary | ICD-10-CM | POA: Diagnosis not present

## 2020-11-05 DIAGNOSIS — M62562 Muscle wasting and atrophy, not elsewhere classified, left lower leg: Secondary | ICD-10-CM | POA: Diagnosis not present

## 2020-11-05 DIAGNOSIS — I6389 Other cerebral infarction: Secondary | ICD-10-CM | POA: Diagnosis not present

## 2020-11-05 DIAGNOSIS — M6389 Disorders of muscle in diseases classified elsewhere, multiple sites: Secondary | ICD-10-CM | POA: Diagnosis not present

## 2020-11-05 DIAGNOSIS — R278 Other lack of coordination: Secondary | ICD-10-CM | POA: Diagnosis not present

## 2020-11-05 DIAGNOSIS — I69391 Dysphagia following cerebral infarction: Secondary | ICD-10-CM | POA: Diagnosis not present

## 2020-11-08 DIAGNOSIS — Z9181 History of falling: Secondary | ICD-10-CM | POA: Diagnosis not present

## 2020-11-08 DIAGNOSIS — I6389 Other cerebral infarction: Secondary | ICD-10-CM | POA: Diagnosis not present

## 2020-11-08 DIAGNOSIS — M84350S Stress fracture, pelvis, sequela: Secondary | ICD-10-CM | POA: Diagnosis not present

## 2020-11-08 DIAGNOSIS — M6389 Disorders of muscle in diseases classified elsewhere, multiple sites: Secondary | ICD-10-CM | POA: Diagnosis not present

## 2020-11-08 DIAGNOSIS — R2689 Other abnormalities of gait and mobility: Secondary | ICD-10-CM | POA: Diagnosis not present

## 2020-11-08 DIAGNOSIS — M62562 Muscle wasting and atrophy, not elsewhere classified, left lower leg: Secondary | ICD-10-CM | POA: Diagnosis not present

## 2020-11-08 DIAGNOSIS — R278 Other lack of coordination: Secondary | ICD-10-CM | POA: Diagnosis not present

## 2020-11-09 DIAGNOSIS — M84350S Stress fracture, pelvis, sequela: Secondary | ICD-10-CM | POA: Diagnosis not present

## 2020-11-09 DIAGNOSIS — M62562 Muscle wasting and atrophy, not elsewhere classified, left lower leg: Secondary | ICD-10-CM | POA: Diagnosis not present

## 2020-11-09 DIAGNOSIS — M6389 Disorders of muscle in diseases classified elsewhere, multiple sites: Secondary | ICD-10-CM | POA: Diagnosis not present

## 2020-11-09 DIAGNOSIS — R278 Other lack of coordination: Secondary | ICD-10-CM | POA: Diagnosis not present

## 2020-11-09 DIAGNOSIS — I6389 Other cerebral infarction: Secondary | ICD-10-CM | POA: Diagnosis not present

## 2020-11-09 DIAGNOSIS — R2689 Other abnormalities of gait and mobility: Secondary | ICD-10-CM | POA: Diagnosis not present

## 2020-11-09 DIAGNOSIS — Z9181 History of falling: Secondary | ICD-10-CM | POA: Diagnosis not present

## 2020-11-10 DIAGNOSIS — M84350S Stress fracture, pelvis, sequela: Secondary | ICD-10-CM | POA: Diagnosis not present

## 2020-11-10 DIAGNOSIS — I6389 Other cerebral infarction: Secondary | ICD-10-CM | POA: Diagnosis not present

## 2020-11-10 DIAGNOSIS — M62562 Muscle wasting and atrophy, not elsewhere classified, left lower leg: Secondary | ICD-10-CM | POA: Diagnosis not present

## 2020-11-10 DIAGNOSIS — M6389 Disorders of muscle in diseases classified elsewhere, multiple sites: Secondary | ICD-10-CM | POA: Diagnosis not present

## 2020-11-10 DIAGNOSIS — R278 Other lack of coordination: Secondary | ICD-10-CM | POA: Diagnosis not present

## 2020-11-10 DIAGNOSIS — R2689 Other abnormalities of gait and mobility: Secondary | ICD-10-CM | POA: Diagnosis not present

## 2020-11-10 DIAGNOSIS — Z9181 History of falling: Secondary | ICD-10-CM | POA: Diagnosis not present

## 2020-11-11 DIAGNOSIS — M6389 Disorders of muscle in diseases classified elsewhere, multiple sites: Secondary | ICD-10-CM | POA: Diagnosis not present

## 2020-11-11 DIAGNOSIS — R2689 Other abnormalities of gait and mobility: Secondary | ICD-10-CM | POA: Diagnosis not present

## 2020-11-11 DIAGNOSIS — M62562 Muscle wasting and atrophy, not elsewhere classified, left lower leg: Secondary | ICD-10-CM | POA: Diagnosis not present

## 2020-11-11 DIAGNOSIS — I6389 Other cerebral infarction: Secondary | ICD-10-CM | POA: Diagnosis not present

## 2020-11-11 DIAGNOSIS — Z9181 History of falling: Secondary | ICD-10-CM | POA: Diagnosis not present

## 2020-11-11 DIAGNOSIS — R278 Other lack of coordination: Secondary | ICD-10-CM | POA: Diagnosis not present

## 2020-11-11 DIAGNOSIS — M84350S Stress fracture, pelvis, sequela: Secondary | ICD-10-CM | POA: Diagnosis not present

## 2020-11-12 DIAGNOSIS — M84350S Stress fracture, pelvis, sequela: Secondary | ICD-10-CM | POA: Diagnosis not present

## 2020-11-12 DIAGNOSIS — R278 Other lack of coordination: Secondary | ICD-10-CM | POA: Diagnosis not present

## 2020-11-12 DIAGNOSIS — R2689 Other abnormalities of gait and mobility: Secondary | ICD-10-CM | POA: Diagnosis not present

## 2020-11-12 DIAGNOSIS — M62562 Muscle wasting and atrophy, not elsewhere classified, left lower leg: Secondary | ICD-10-CM | POA: Diagnosis not present

## 2020-11-12 DIAGNOSIS — I6389 Other cerebral infarction: Secondary | ICD-10-CM | POA: Diagnosis not present

## 2020-11-12 DIAGNOSIS — Z9181 History of falling: Secondary | ICD-10-CM | POA: Diagnosis not present

## 2020-11-12 DIAGNOSIS — M6389 Disorders of muscle in diseases classified elsewhere, multiple sites: Secondary | ICD-10-CM | POA: Diagnosis not present

## 2020-11-15 ENCOUNTER — Encounter: Payer: Self-pay | Admitting: Neurology

## 2020-11-15 ENCOUNTER — Other Ambulatory Visit: Payer: Self-pay

## 2020-11-15 ENCOUNTER — Ambulatory Visit (INDEPENDENT_AMBULATORY_CARE_PROVIDER_SITE_OTHER): Payer: Medicare Other | Admitting: Neurology

## 2020-11-15 VITALS — BP 152/90 | HR 89 | Ht 66.0 in | Wt 130.0 lb

## 2020-11-15 DIAGNOSIS — G8194 Hemiplegia, unspecified affecting left nondominant side: Secondary | ICD-10-CM | POA: Diagnosis not present

## 2020-11-15 DIAGNOSIS — M62562 Muscle wasting and atrophy, not elsewhere classified, left lower leg: Secondary | ICD-10-CM | POA: Diagnosis not present

## 2020-11-15 DIAGNOSIS — R278 Other lack of coordination: Secondary | ICD-10-CM | POA: Diagnosis not present

## 2020-11-15 DIAGNOSIS — M6389 Disorders of muscle in diseases classified elsewhere, multiple sites: Secondary | ICD-10-CM | POA: Diagnosis not present

## 2020-11-15 DIAGNOSIS — Z9181 History of falling: Secondary | ICD-10-CM | POA: Diagnosis not present

## 2020-11-15 DIAGNOSIS — R2689 Other abnormalities of gait and mobility: Secondary | ICD-10-CM | POA: Diagnosis not present

## 2020-11-15 DIAGNOSIS — I639 Cerebral infarction, unspecified: Secondary | ICD-10-CM

## 2020-11-15 DIAGNOSIS — M84350S Stress fracture, pelvis, sequela: Secondary | ICD-10-CM | POA: Diagnosis not present

## 2020-11-15 DIAGNOSIS — I6389 Other cerebral infarction: Secondary | ICD-10-CM | POA: Diagnosis not present

## 2020-11-15 NOTE — Progress Notes (Signed)
[facility] [address] [place]. [place] [address] [phone]       OFFICE CONSULT NOTE  [name] Date of Birth:  [birth year] Medical Record Number:  [id number]   Referring MD: Eleonore Chiquito  Reason for Referral: Stroke  HPI: [name] is a 87 year old Caucasian lady seen today for initial office consultation visit for stroke.  She is accompanied by her daughter.  History is obtained from them and review of electronic medical records and personal review of imaging studies.  She has past medical history of hypertension, diabetes, atrial fibrillation was not on anticoagulation and was admitted recently in October 21 secondary to pelvic fracture with large extraperitoneal hematoma.  While admitted to [facility] long hospital she developed sudden onset neurological change on 08/17/2020 at 1927 when she developed left hemiparesis.  Code stroke was called.  CT scan of the head was obtained which showed no acute hemorrhage.  Aspect score was 10.  Patient has history of IV dye allergy and contrast was not administered.  She is not a tPA candidate due to the large retroperitoneal bleed.  She had an MRI scan of the brain which showed a large right basal ganglia stroke with MRA of the brain showing loss of focal signal loss in the right M2 MCA but preserved distal flow.  NIH stroke scale was 11 on admission.  Echocardiogram showed normal ejection fraction of 60 to 65% hemoglobin A1c was 5.7.  LDL cholesterol was 81.3 mg percent.  Patient was started on aspirin and was [facility] and has made very slow progress and she is continuing ongoing physical occupational therapy but is able to walk with one-person assist with hemiwalker.  She has now been transferred to the skilled nursing section.  In fact had a fall this morning and she reached down to pick up something from the floor.  She has a small left scalp hematoma and a bruise in the left frontal region.  The  patient is cognitively done well without any deficits but continues to have significant left hemiplegia and can barely bend her fingers in the left hand and move her left shoulder as well as move the left hip only.  She has history of atrial fibrillation but has not been on anticoagulation due to fall risk and advanced age. ROS:   14 system review of systems is positive for weakness, difficulty walking , memory loss falls and all systems negative PMH:  Past Medical History:  Diagnosis Date  . Actinic keratosis 05/25/2014  . Anxiety   . Atrial fibrillation ([place]) 09/21/2010  . Basal cell carcinoma 05/25/2014   Multiple removed by Dr. Danella Sensing in March 2015: right neck, left neck, scalp   . Bell's palsy 07/18/1979  . Cervicalgia 01/31/2012  . Closed fracture of lumbar vertebra without mention of spinal cord injury 07/17/2005  . Conjunctiva disorder 12/26/2010  . Coronary atherosclerosis of native coronary artery 07/17/1997  . Cramp of limb 08/16/2009  . Degeneration of lumbar or lumbosacral intervertebral disc 01/31/2012  . Disturbance of skin sensation 08/2009  . Dizziness and giddiness 11/08/2011   vertigo  . External hemorrhoids without mention of complication 123XX123  . External hemorrhoids without mention of complication A999333  . Ganglion of tendon sheath 08/16/2009  . Leg cramp 10/27/2013   Most frequently the right leg.   . Long term (current) use of anticoagulants 09/2010  . Lumbago 07/2009  . Meralgia paresthetica 07/2007  . MI, old   .  Myalgia and myositis, unspecified 01/17/2012  . Osteoporosis   . Other abnormal blood chemistry 04/08/2012  . Other abnormal blood chemistry 2013   hyperglycemia  . Other disorder of muscle, ligament, and fascia 04/08/2012  . Other specified cardiac dysrhythmias(427.89) 06/27/2010  . Pacemaker 05/06/2020  . Pain in joint, ankle and foot 10/27/2013   Bilateral since 1998   . Pain in joint, shoulder region 08/12/2012  . Pain in joint, upper arm  12/26/2010  . Pain in limb 01/11/2011  . Pain in thoracic spine 01/31/2012  . 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 PVC's (premature ventricular contractions)   . Rash and other nonspecific skin eruption 08/02/2011  . Senile osteoporosis 07/17/1993  . Stroke ([place])   . Unspecified essential hypertension 07/17/1997  . Varicose veins of lower extremities 08/16/2009  . Varicose veins of lower extremities 08/16/2009    Social History:  Social History   Socioeconomic History  . Marital status: Widowed    Spouse name: Not on file  . Number of children: Not on file  . Years of education: Not on file  . Highest education level: Not on file  Occupational History  . Occupation: Retired Pharmacist, hospital  Tobacco Use  . Smoking status: Never Smoker  . Smokeless tobacco: Never Used  Vaping Use  . Vaping Use: Never used  Substance and Sexual Activity  . Alcohol use: Yes    Comment: occasional wine  . Drug use: No  . Sexual activity: Never  Other Topics Concern  . Not on file  Social History Narrative   Living at [facility] since [facility]   Widowed 09/03/2016   Never smoked   Alcohol occasional wine   Exercise, house work    DNR , POA, Living Will   Social Determinants of Health   Financial Resource Strain: Not on file  Food Insecurity: Not on file  Transportation Needs: Not on file  Physical Activity: Not on file  Stress: Not on file  Social Connections: Not on file  Intimate Partner Violence: Not on file    Medications:   Current Outpatient Medications on File Prior to Visit  Medication Sig Dispense Refill  . acetaminophen (TYLENOL) 500 MG tablet Take 1,000 mg by mouth every 6 (six) hours as needed for mild pain (pain.).     Marland Kitchen aspirin EC 81 MG EC tablet Take 1 tablet (81 mg total) by mouth daily. Swallow whole. 30 tablet 11  . atenolol (TENORMIN) 25 MG tablet Take 1 tablet (25 mg total) by mouth 2 (two) times daily.    . calcium-vitamin D (OSCAL WITH D) 500-200 MG-UNIT tablet Take  1 tablet by mouth daily.    . diclofenac Sodium (VOLTAREN) 1 % GEL Apply 4 g topically 4 (four) times daily as needed. Left knee    . digoxin (LANOXIN) 0.125 MG tablet Take 0.125 mg by mouth daily.    . hydrALAZINE (APRESOLINE) 10 MG tablet Take 10 mg by mouth 3 (three) times daily. TID PRN for SBP >170    . LORazepam (ATIVAN) 1 MG tablet Take 1 tablet (1 mg total) by mouth at bedtime. 30 tablet 5  . ondansetron (ZOFRAN) 4 MG tablet Take 1 tablet (4 mg total) by mouth every 6 (six) hours as needed for nausea. 20 tablet 0  . polyethylene glycol (MIRALAX / GLYCOLAX) 17 g packet Take 17 g by mouth daily. 14 each 0  . traMADol (ULTRAM) 50 MG tablet Take 1 tablet (50 mg total) by mouth every 6 (six) hours  as needed for moderate pain or severe pain. 120 tablet 0   No current facility-administered medications on file prior to visit.    Allergies:   Allergies  Allergen Reactions  . Iodinated Diagnostic Agents Nausea Only and Other (See Comments)    Severe nausea and also passed out  . Bactrim Rash  . Relafen [Nabumetone] Rash  . Sulfanilamide Rash    Physical Exam General: Frail malnourished looking elderly Caucasian lady, seated, in no evident distress Head: head normocephalic and atraumatic.   Neck: supple with no carotid or supraclavicular bruits Cardiovascular: regular rate and rhythm, no murmurs Musculoskeletal: no deformity Skin:  no rash/petichiae..  Left frontal scalp bruising and hematoma from fall this morning Vascular:  Normal pulses all extremities  Neurologic Exam Mental Status: Awake and fully alert. Oriented to place and time. Recent and remote memory diminished attention span, concentration and fund of knowledge diminished slightly. Mood and affect appropriate.  Cranial Nerves: Fundoscopic exam reveals sharp disc margins. Pupils equal, briskly reactive to light. Extraocular movements full without nystagmus. Visual fields full to confrontation. Hearing diminished bilaterally.   Left lower face weakness.  Sensation intact. Face, tongue, palate moves normally and symmetrically.  Motor: Spastic left hemiparesis with grade 1/5 strength in the left upper and lower extremity and able to move the left elbow slightly, shoulder shrug and slight flexion of the hand.  Slight numbness to the left hip and left ankle only Sensory.: intact to touch , pinprick , position and vibratory sensation.  Coordination: Intact on the right side impaired on the left side proportionate to the weakness Gait and Station: Deferred as patient is maximum assist did not bring walker.  Reflexes: 2+ and asymmetric and brisk on the left. Toes downgoing.   NIHSS  9 Modified [place]  4   ASSESSMENT: 87 year old Caucasian lady with large right basal ganglia infarct in October 2021 with significant residual left hemiplegia from atrial fibrillation.  Patient remains a fall risk and not a good candidate for long-term anticoagulation     PLAN: I had a long d/w patient and her daughter about her recent stroke, atrial fibrillation,left hemiplegia, risk for recurrent stroke/TIAs, personally independently reviewed imaging studies and stroke evaluation results and answered questions.Continue aspirin 81 mg daily  for secondary stroke prevention  As she is not a anticoagulation candidate given fall risk and maintain strict control of hypertension with blood pressure goal below 130/90, diabetes with hemoglobin A1c goal below 6.5% and lipids with LDL cholesterol goal below 70 mg/dL. I also advised the patient to eat a healthy diet with plenty of whole grains, cereals, fruits and vegetables, exercise regularly and maintain ideal body weight.  Continue ongoing physical and home therapy at [facility] skilled [facility] facility where she lives.  Greater than 50% time during this 45-minute consultation was spent on counseling and coordination of care about basal ganglia infarct and atrial fibrillation discussion about prevention and  treatment and answering questions.  Followup in the future with my nurse practitioner Janett Billow in 3 months or call earlier if necessary. Archibald Marchetta,MD Note: This document was prepared with digital dictation and possible smart phrase technology. Any transcriptional errors that result from this process are unintentional.

## 2020-11-15 NOTE — Patient Instructions (Signed)
I had a long d/w patient and her daughter about her recent stroke, atrial fibrillation,left hemiplegia, risk for recurrent stroke/TIAs, personally independently reviewed imaging studies and stroke evaluation results and answered questions.Continue aspirin 81 mg daily  for secondary stroke prevention  As she is not a anticoagulation candidate given fall risk and maintain strict control of hypertension with blood pressure goal below 130/90, diabetes with hemoglobin A1c goal below 6.5% and lipids with LDL cholesterol goal below 70 mg/dL. I also advised the patient to eat a healthy diet with plenty of whole grains, cereals, fruits and vegetables, exercise regularly and maintain ideal body weight.  Continue ongoing physical and home therapy at [facility] skilled nursing facility where she lives.  Followup in the future with my nurse practitioner Janett Billow in 3 months or call earlier if necessary.

## 2020-11-16 DIAGNOSIS — M84350S Stress fracture, pelvis, sequela: Secondary | ICD-10-CM | POA: Diagnosis not present

## 2020-11-16 DIAGNOSIS — M6389 Disorders of muscle in diseases classified elsewhere, multiple sites: Secondary | ICD-10-CM | POA: Diagnosis not present

## 2020-11-16 DIAGNOSIS — M62562 Muscle wasting and atrophy, not elsewhere classified, left lower leg: Secondary | ICD-10-CM | POA: Diagnosis not present

## 2020-11-16 DIAGNOSIS — R2689 Other abnormalities of gait and mobility: Secondary | ICD-10-CM | POA: Diagnosis not present

## 2020-11-16 DIAGNOSIS — R278 Other lack of coordination: Secondary | ICD-10-CM | POA: Diagnosis not present

## 2020-11-16 DIAGNOSIS — I6389 Other cerebral infarction: Secondary | ICD-10-CM | POA: Diagnosis not present

## 2020-11-16 DIAGNOSIS — Z9181 History of falling: Secondary | ICD-10-CM | POA: Diagnosis not present

## 2020-11-17 DIAGNOSIS — Z9181 History of falling: Secondary | ICD-10-CM | POA: Diagnosis not present

## 2020-11-17 DIAGNOSIS — M84350S Stress fracture, pelvis, sequela: Secondary | ICD-10-CM | POA: Diagnosis not present

## 2020-11-17 DIAGNOSIS — I6389 Other cerebral infarction: Secondary | ICD-10-CM | POA: Diagnosis not present

## 2020-11-17 DIAGNOSIS — M62562 Muscle wasting and atrophy, not elsewhere classified, left lower leg: Secondary | ICD-10-CM | POA: Diagnosis not present

## 2020-11-17 DIAGNOSIS — M6389 Disorders of muscle in diseases classified elsewhere, multiple sites: Secondary | ICD-10-CM | POA: Diagnosis not present

## 2020-11-17 DIAGNOSIS — R2689 Other abnormalities of gait and mobility: Secondary | ICD-10-CM | POA: Diagnosis not present

## 2020-11-17 DIAGNOSIS — R278 Other lack of coordination: Secondary | ICD-10-CM | POA: Diagnosis not present

## 2020-11-18 DIAGNOSIS — R2689 Other abnormalities of gait and mobility: Secondary | ICD-10-CM | POA: Diagnosis not present

## 2020-11-18 DIAGNOSIS — Z9181 History of falling: Secondary | ICD-10-CM | POA: Diagnosis not present

## 2020-11-18 DIAGNOSIS — I6389 Other cerebral infarction: Secondary | ICD-10-CM | POA: Diagnosis not present

## 2020-11-18 DIAGNOSIS — M84350S Stress fracture, pelvis, sequela: Secondary | ICD-10-CM | POA: Diagnosis not present

## 2020-11-18 DIAGNOSIS — M6389 Disorders of muscle in diseases classified elsewhere, multiple sites: Secondary | ICD-10-CM | POA: Diagnosis not present

## 2020-11-18 DIAGNOSIS — M62562 Muscle wasting and atrophy, not elsewhere classified, left lower leg: Secondary | ICD-10-CM | POA: Diagnosis not present

## 2020-11-18 DIAGNOSIS — R278 Other lack of coordination: Secondary | ICD-10-CM | POA: Diagnosis not present

## 2020-11-19 DIAGNOSIS — M84350S Stress fracture, pelvis, sequela: Secondary | ICD-10-CM | POA: Diagnosis not present

## 2020-11-19 DIAGNOSIS — R278 Other lack of coordination: Secondary | ICD-10-CM | POA: Diagnosis not present

## 2020-11-19 DIAGNOSIS — R2689 Other abnormalities of gait and mobility: Secondary | ICD-10-CM | POA: Diagnosis not present

## 2020-11-19 DIAGNOSIS — I6389 Other cerebral infarction: Secondary | ICD-10-CM | POA: Diagnosis not present

## 2020-11-19 DIAGNOSIS — Z9181 History of falling: Secondary | ICD-10-CM | POA: Diagnosis not present

## 2020-11-19 DIAGNOSIS — M62562 Muscle wasting and atrophy, not elsewhere classified, left lower leg: Secondary | ICD-10-CM | POA: Diagnosis not present

## 2020-11-19 DIAGNOSIS — M6389 Disorders of muscle in diseases classified elsewhere, multiple sites: Secondary | ICD-10-CM | POA: Diagnosis not present

## 2020-11-22 DIAGNOSIS — M84350S Stress fracture, pelvis, sequela: Secondary | ICD-10-CM | POA: Diagnosis not present

## 2020-11-22 DIAGNOSIS — M62562 Muscle wasting and atrophy, not elsewhere classified, left lower leg: Secondary | ICD-10-CM | POA: Diagnosis not present

## 2020-11-22 DIAGNOSIS — R278 Other lack of coordination: Secondary | ICD-10-CM | POA: Diagnosis not present

## 2020-11-22 DIAGNOSIS — R2689 Other abnormalities of gait and mobility: Secondary | ICD-10-CM | POA: Diagnosis not present

## 2020-11-22 DIAGNOSIS — Z9181 History of falling: Secondary | ICD-10-CM | POA: Diagnosis not present

## 2020-11-22 DIAGNOSIS — I6389 Other cerebral infarction: Secondary | ICD-10-CM | POA: Diagnosis not present

## 2020-11-23 DIAGNOSIS — R278 Other lack of coordination: Secondary | ICD-10-CM | POA: Diagnosis not present

## 2020-11-23 DIAGNOSIS — M62562 Muscle wasting and atrophy, not elsewhere classified, left lower leg: Secondary | ICD-10-CM | POA: Diagnosis not present

## 2020-11-23 DIAGNOSIS — M6389 Disorders of muscle in diseases classified elsewhere, multiple sites: Secondary | ICD-10-CM | POA: Diagnosis not present

## 2020-11-23 DIAGNOSIS — Z9181 History of falling: Secondary | ICD-10-CM | POA: Diagnosis not present

## 2020-11-23 DIAGNOSIS — R2689 Other abnormalities of gait and mobility: Secondary | ICD-10-CM | POA: Diagnosis not present

## 2020-11-23 DIAGNOSIS — M84350S Stress fracture, pelvis, sequela: Secondary | ICD-10-CM | POA: Diagnosis not present

## 2020-11-23 DIAGNOSIS — I6389 Other cerebral infarction: Secondary | ICD-10-CM | POA: Diagnosis not present

## 2020-11-24 DIAGNOSIS — R2689 Other abnormalities of gait and mobility: Secondary | ICD-10-CM | POA: Diagnosis not present

## 2020-11-24 DIAGNOSIS — R278 Other lack of coordination: Secondary | ICD-10-CM | POA: Diagnosis not present

## 2020-11-24 DIAGNOSIS — I6389 Other cerebral infarction: Secondary | ICD-10-CM | POA: Diagnosis not present

## 2020-11-24 DIAGNOSIS — M6389 Disorders of muscle in diseases classified elsewhere, multiple sites: Secondary | ICD-10-CM | POA: Diagnosis not present

## 2020-11-24 DIAGNOSIS — M62562 Muscle wasting and atrophy, not elsewhere classified, left lower leg: Secondary | ICD-10-CM | POA: Diagnosis not present

## 2020-11-24 DIAGNOSIS — M84350S Stress fracture, pelvis, sequela: Secondary | ICD-10-CM | POA: Diagnosis not present

## 2020-11-24 DIAGNOSIS — Z9181 History of falling: Secondary | ICD-10-CM | POA: Diagnosis not present

## 2020-11-25 ENCOUNTER — Non-Acute Institutional Stay (SKILLED_NURSING_FACILITY): Payer: Medicare Other | Admitting: Adult Health

## 2020-11-25 DIAGNOSIS — R2689 Other abnormalities of gait and mobility: Secondary | ICD-10-CM | POA: Diagnosis not present

## 2020-11-25 DIAGNOSIS — M62562 Muscle wasting and atrophy, not elsewhere classified, left lower leg: Secondary | ICD-10-CM | POA: Diagnosis not present

## 2020-11-25 DIAGNOSIS — I10 Essential (primary) hypertension: Secondary | ICD-10-CM

## 2020-11-25 DIAGNOSIS — I6389 Other cerebral infarction: Secondary | ICD-10-CM | POA: Diagnosis not present

## 2020-11-25 DIAGNOSIS — Z9181 History of falling: Secondary | ICD-10-CM | POA: Diagnosis not present

## 2020-11-25 DIAGNOSIS — R1312 Dysphagia, oropharyngeal phase: Secondary | ICD-10-CM | POA: Diagnosis not present

## 2020-11-25 DIAGNOSIS — R278 Other lack of coordination: Secondary | ICD-10-CM | POA: Diagnosis not present

## 2020-11-25 DIAGNOSIS — K5901 Slow transit constipation: Secondary | ICD-10-CM

## 2020-11-25 DIAGNOSIS — I69354 Hemiplegia and hemiparesis following cerebral infarction affecting left non-dominant side: Secondary | ICD-10-CM

## 2020-11-25 DIAGNOSIS — M84350S Stress fracture, pelvis, sequela: Secondary | ICD-10-CM | POA: Diagnosis not present

## 2020-11-25 DIAGNOSIS — N3945 Continuous leakage: Secondary | ICD-10-CM | POA: Diagnosis not present

## 2020-11-25 DIAGNOSIS — M6389 Disorders of muscle in diseases classified elsewhere, multiple sites: Secondary | ICD-10-CM | POA: Diagnosis not present

## 2020-11-25 DIAGNOSIS — I4821 Permanent atrial fibrillation: Secondary | ICD-10-CM | POA: Diagnosis not present

## 2020-11-26 DIAGNOSIS — Z9181 History of falling: Secondary | ICD-10-CM | POA: Diagnosis not present

## 2020-11-26 DIAGNOSIS — M84350S Stress fracture, pelvis, sequela: Secondary | ICD-10-CM | POA: Diagnosis not present

## 2020-11-26 DIAGNOSIS — M6389 Disorders of muscle in diseases classified elsewhere, multiple sites: Secondary | ICD-10-CM | POA: Diagnosis not present

## 2020-11-26 DIAGNOSIS — I6389 Other cerebral infarction: Secondary | ICD-10-CM | POA: Diagnosis not present

## 2020-11-26 DIAGNOSIS — R278 Other lack of coordination: Secondary | ICD-10-CM | POA: Diagnosis not present

## 2020-11-26 DIAGNOSIS — M62562 Muscle wasting and atrophy, not elsewhere classified, left lower leg: Secondary | ICD-10-CM | POA: Diagnosis not present

## 2020-11-26 DIAGNOSIS — R2689 Other abnormalities of gait and mobility: Secondary | ICD-10-CM | POA: Diagnosis not present

## 2020-11-27 ENCOUNTER — Encounter: Payer: Self-pay | Admitting: Adult Health

## 2020-11-27 NOTE — Progress Notes (Signed)
Location:  Occupational psychologist of Service:  SNF (31) Provider:   Cindi Carbon, ANP [place] [phone]   [name], DO  Patient Care Team: [name], DO as PCP - General (Geriatric Medicine) [name], MD as PCP - Electrophysiology (Cardiology) [name], MD as Consulting Physician (Dermatology) [name], MD as Consulting Physician (Cardiology) [facility] [name], MD as Consulting Physician (Dermatology)  Extended Emergency Contact Information Primary Emergency Contact: [name] of [place] Phone: [phone] Mobile Phone: [phone] Relation: Daughter Secondary Emergency Contact: [name],[name]  [name] of [place] Phone: [phone] Relation: Son  Code Status:  DNR Goals of care: Advanced Directive information Advanced Directives 09/23/2020  Does Patient Have a Medical Advance Directive? Yes  Type of Paramedic of [place];Out of facility DNR (pink MOST or yellow form)  Does patient want to make changes to medical advance directive? No - Patient declined  Copy of [place] in Chart? Yes - validated most recent copy scanned in chart (See row information)  Pre-existing out of facility DNR order (yellow form or pink MOST form) Yellow form placed in chart (order not valid for inpatient use)     Chief Complaint  Patient presents with  . Medical Management of Chronic Issues    HPI:  Pt is a 87 y.o. female seen today for medical management of chronic diseases.  Ms. Vaux resides in skilled care after a right basal ganglia CVA in Oct of 2021. She has left sided weakness but has made gains in therapy and is now ambulating short distances with a walker and assistance with therapy only. The staff use a stand up lift for transfers. Also she has made gains in speech. She is on a D3 diet and thin liquids and  tolerating well. She reports that she has stool accidents during therapy and is working to find a bowel regimen to avoid this. She is also incontinent of urine since the CVA.  She is adjusting well the the skilled environment. She has a positive attitude and is motivated to continue to work with therapy.  Her BP has been in the 150-160 range per [facility] records. MMSE 24/30 08/26/20.  She was trying to bend down and pick something up and fell hitting her head on 1/10.  She sustained bruising to the left side of her face but no severe injury. Seen by neuro on the same day for f/u apt.     Past Medical History:  Diagnosis Date  . Actinic keratosis 05/25/2014  . Anxiety   . Atrial fibrillation ([place]) 09/21/2010  . Basal cell carcinoma 05/25/2014   Multiple removed by Dr. Danella Sensing in March 2015: right neck, left neck, scalp   . Bell's palsy 07/18/1979  . Cervicalgia 01/31/2012  . Closed fracture of lumbar vertebra without mention of spinal cord injury 07/17/2005  . Conjunctiva disorder 12/26/2010  . Coronary atherosclerosis of native coronary artery 07/17/1997  . Cramp of limb 08/16/2009  . Degeneration of lumbar or lumbosacral intervertebral disc 01/31/2012  . Disturbance of skin sensation 08/2009  . Dizziness and giddiness 11/08/2011   vertigo  . External hemorrhoids without mention of complication 123XX123  . External hemorrhoids without mention of complication A999333  . Ganglion of tendon sheath 08/16/2009  . Leg cramp 10/27/2013   Most frequently the right leg.   . Long term (current) use of anticoagulants 09/2010  . Lumbago 07/2009  .  Meralgia paresthetica 07/2007  . MI, old   . Myalgia and myositis, unspecified 01/17/2012  . Osteoporosis   . Other abnormal blood chemistry 04/08/2012  . Other abnormal blood chemistry 2013   hyperglycemia  . Other disorder of muscle, ligament, and fascia 04/08/2012  . Other specified cardiac dysrhythmias(427.89) 06/27/2010  . Pacemaker 05/06/2020  .  Pain in joint, ankle and foot 10/27/2013   Bilateral since 1998   . Pain in joint, shoulder region 08/12/2012  . Pain in joint, upper arm 12/26/2010  . Pain in limb 01/11/2011  . Pain in thoracic spine 01/31/2012  . 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 PVC's (premature ventricular contractions)   . Rash and other nonspecific skin eruption 08/02/2011  . Senile osteoporosis 07/17/1993  . Stroke ([place])   . Unspecified essential hypertension 07/17/1997  . Varicose veins of lower extremities 08/16/2009  . Varicose veins of lower extremities 08/16/2009   Past Surgical History:  Procedure Laterality Date  . COLONOSCOPY WITH PROPOFOL N/A 10/19/2015   Procedure: COLONOSCOPY WITH PROPOFOL;  Surgeon: Gatha Mayer, MD;  Location: [facility];  Service: Endoscopy;  Laterality: N/A;  . CORONARY ARTERY BYPASS GRAFT  1998   x2; LIMA to LAD; SVG to diagonal off bypass  . HEMORRHOID SURGERY  08/26/2012   Dr. Brantley Stage  . LOOP RECORDER INSERTION N/A 05/13/2018   Procedure: LOOP RECORDER INSERTION;  Surgeon: [name], MD;  Location: [place] [facility];  Service: Cardiovascular;  Laterality: N/A;  . LOOP RECORDER REMOVAL N/A 05/26/2019   Procedure: LOOP RECORDER REMOVAL;  Surgeon: [name], MD;  Location: [place] [facility];  Service: Cardiovascular;  Laterality: N/A;  . MASS EXCISION Left 11/23/2015   Procedure: LEFT WRIST EXCISION CYST;  Surgeon: Leanora Cover, MD;  Location: [place];  Service: Orthopedics;  Laterality: Left;  . PACEMAKER IMPLANT N/A 05/26/2019   Procedure: PACEMAKER IMPLANT;  Surgeon: [name], MD;  Location: [place] [facility];  Service: Cardiovascular;  Laterality: N/A;  . SKIN BIOPSY  01/29/14   (R) neck; (R) scalp, 2 (L) neck; shave biopsy Superficial basal cell carcinoma Dr. Danella Sensing  . TONSILLECTOMY  1940's    Allergies  Allergen Reactions  . Iodinated Diagnostic Agents Nausea Only and Other (See Comments)    Severe nausea and also  passed out  . Bactrim Rash  . Relafen [Nabumetone] Rash  . Sulfanilamide Rash    Outpatient Encounter Medications as of 11/25/2020  Medication Sig  . acetaminophen (TYLENOL) 500 MG tablet Take 1,000 mg by mouth every 6 (six) hours as needed for mild pain (pain.).   Marland Kitchen aspirin EC 81 MG EC tablet Take 1 tablet (81 mg total) by mouth daily. Swallow whole.  Marland Kitchen atenolol (TENORMIN) 25 MG tablet Take 1 tablet (25 mg total) by mouth 2 (two) times daily.  . calcium-vitamin D (OSCAL WITH D) 500-200 MG-UNIT tablet Take 1 tablet by mouth daily.  . diclofenac Sodium (VOLTAREN) 1 % GEL Apply 4 g topically 4 (four) times daily as needed. Left knee  . digoxin (LANOXIN) 0.125 MG tablet Take 0.125 mg by mouth daily.  . hydrALAZINE (APRESOLINE) 10 MG tablet Take 10 mg by mouth 3 (three) times daily. TID PRN for SBP >170  . LORazepam (ATIVAN) 1 MG tablet Take 1 tablet (1 mg total) by mouth at bedtime.  . ondansetron (ZOFRAN) 4 MG tablet Take 1 tablet (4 mg total) by mouth every 6 (six) hours as needed for nausea.  Marland Kitchen  polyethylene glycol (MIRALAX / GLYCOLAX) 17 g packet Take 17 g by mouth daily.  . traMADol (ULTRAM) 50 MG tablet Take 1 tablet (50 mg total) by mouth every 6 (six) hours as needed for moderate pain or severe pain.   No facility-administered encounter medications on file as of 11/25/2020.    Review of Systems  Constitutional: Negative for activity change, appetite change, chills, diaphoresis, fatigue, fever and unexpected weight change.  HENT: Negative for congestion.   Respiratory: Negative for cough, shortness of breath and wheezing.   Cardiovascular: Negative for chest pain, palpitations and leg swelling.  Gastrointestinal: Negative for abdominal distention, abdominal pain, constipation and diarrhea.  Genitourinary: Negative for difficulty urinating and dysuria.  Musculoskeletal: Positive for gait problem. Negative for arthralgias, back pain, joint swelling and myalgias.  Skin: Positive for  color change. Negative for wound.  Neurological: Positive for speech difficulty, weakness and numbness. Negative for dizziness, tremors, seizures, syncope, facial asymmetry, light-headedness and headaches.       Cold hands and feet   Psychiatric/Behavioral: Negative for agitation, behavioral problems, confusion and sleep disturbance. The patient is not nervous/anxious.        Memory issues    Immunization History  Administered Date(s) Administered  . Influenza Whole 08/06/2012  . Influenza, High Dose Seasonal PF 08/15/2019, 08/27/2020  . Influenza,inj,Quad PF,6+ Mos 08/30/2018  . Influenza-Unspecified 08/06/2013, 08/24/2014, 08/26/2015, 08/31/2016, 08/27/2017  . Moderna Sars-Covid-2 Vaccination 11/16/2019, 12/16/2019  . Pneumococcal Conjugate-13 09/22/2015  . Pneumococcal Polysaccharide-23 08/29/2006  . Td 10/07/2007  . Zoster 03/12/2008  . Zoster Recombinat (Shingrix) 02/12/2018, 05/13/2018   Pertinent  Health Maintenance Due  Topic Date Due  . INFLUENZA VACCINE  Completed  . DEXA SCAN  Completed  . PNA vac Low Risk Adult  Completed   Fall Risk  09/23/2020 08/26/2020 07/28/2020 02/25/2020 01/28/2020  Falls in the past year? 1 1 0 0 0  Number falls in past yr: 0 0 1 0 0  Comment - - - - -  Injury with Fall? 1 1 0 0 0  Comment Patient fractured pelvis and had a stroke - - - -  Risk for fall due to : - History of fall(s);Impaired balance/gait;Impaired mobility;Orthopedic patient - - -  Follow up - Falls evaluation completed;Education provided;Falls prevention discussed;Follow up appointment - - -   Functional Status Survey:    Vitals:   11/27/20 0723  Weight: 127 lb 3.2 oz (57.7 kg)   Body mass index is 20.53 kg/m. Physical Exam Vitals and nursing note reviewed.  Constitutional:      General: She is not in acute distress.    Appearance: She is not diaphoretic.  HENT:     Head: Normocephalic.     Comments: Bruising to the left orbit area     Mouth/Throat:     Mouth:  Mucous membranes are moist.     Pharynx: Oropharynx is clear.  Eyes:     Conjunctiva/sclera: Conjunctivae normal.     Pupils: Pupils are equal, round, and reactive to light.  Neck:     Vascular: No JVD.  Cardiovascular:     Rate and Rhythm: Normal rate. Rhythm irregular.     Heart sounds: No murmur heard.   Pulmonary:     Effort: Pulmonary effort is normal. No respiratory distress.     Breath sounds: Normal breath sounds. No wheezing.  Abdominal:     General: Bowel sounds are normal. There is no distension.     Palpations: Abdomen is soft.  Tenderness: There is no abdominal tenderness.  Musculoskeletal:     Right lower leg: No edema.     Left lower leg: No edema.  Skin:    General: Skin is warm and dry.  Neurological:     Mental Status: She is alert and oriented to person, place, and time.     Cranial Nerves: Cranial nerve deficit present.     Comments: Left sided weakness.  Able to move her hand without help but not able to grip well  Psychiatric:        Mood and Affect: Mood normal.     Labs reviewed: Recent Labs    08/17/20 0731 08/18/20 0255 10/12/20 0000  NA 142 137 137  K 3.7 3.9 5.0  CL 105 106 101  CO2 24 23 22   GLUCOSE 112* 134*  --   BUN 24* 21 18  CREATININE 0.89 1.01* 0.7  CALCIUM 9.3 8.7* 9.0   Recent Labs    03/04/20 0000 03/04/20 0955 08/17/20 0731 08/18/20 0255  AST  --  20 32 26  ALT  --  15 24 21   ALKPHOS  --   --  62 53  BILITOT  --   --  1.2 1.2  PROT  --   --  6.5 6.1*  ALBUMIN 3.4*  --  3.7 3.4*   Recent Labs    08/17/20 0731 08/17/20 1322 08/18/20 0255 08/23/20 0521 08/23/20 1621 08/30/20 0000 10/12/20 0000  WBC 15.5* 12.7* 15.9* 16.9*  --  13.9 8.7  NEUTROABS 11.9* 11.1*  --   --   --  10.40  --   HGB 11.2* 9.7* 9.1* 7.3* 8.8* 9.6* 11.5*  HCT 35.0* 31.3* 29.2* 24.1* 27.9* 29* 36  MCV 92.3 93.4 94.5 97.6  --   --   --   PLT 84* 81* 81* 138*  --  197 105*   Lab Results  Component Value Date   TSH 3.80 09/14/2015    Lab Results  Component Value Date   HGBA1C 5.7 (H) 08/19/2020   Lab Results  Component Value Date   CHOL 166 07/22/2020   HDL 53 07/22/2020   LDLCALC 106 03/25/2019   LDLDIRECT 81.3 08/19/2020   TRIG 54 07/22/2020   CHOLHDL 2 09/12/2010    Significant Diagnostic Results in last 30 days:  No results found.  Assessment/Plan 1. 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 Essential hypertension Slightly above goal at times but some numbers are taken by the automatic cuff Will obtain manual bps daily for 1 week and the address   3. Oropharyngeal dysphagia Doing well with a D3 diet and thin liquids Continues asp prec  4. Slow transit constipation Has periods of incontinence and may choose to hold miralax qod if desired to avoid these episodes during therapy. Pt did not want to decrease the miralax due to fear of constipation   5. Continuous leakage of urine Toilet schedule per [facility]  6. Afib Rate is controlled. Only on aspirin due to prior hx of bleeding.  Continue atenolol 25 mg bid and digoxin 0.125 mg   Family/ staff Communication: Resident and her daughter Netta Neat ordered:  NA

## 2020-11-29 DIAGNOSIS — R2689 Other abnormalities of gait and mobility: Secondary | ICD-10-CM | POA: Diagnosis not present

## 2020-11-29 DIAGNOSIS — Z9181 History of falling: Secondary | ICD-10-CM | POA: Diagnosis not present

## 2020-11-29 DIAGNOSIS — M62562 Muscle wasting and atrophy, not elsewhere classified, left lower leg: Secondary | ICD-10-CM | POA: Diagnosis not present

## 2020-11-29 DIAGNOSIS — M6389 Disorders of muscle in diseases classified elsewhere, multiple sites: Secondary | ICD-10-CM | POA: Diagnosis not present

## 2020-11-29 DIAGNOSIS — M84350S Stress fracture, pelvis, sequela: Secondary | ICD-10-CM | POA: Diagnosis not present

## 2020-11-29 DIAGNOSIS — I6389 Other cerebral infarction: Secondary | ICD-10-CM | POA: Diagnosis not present

## 2020-11-29 DIAGNOSIS — R278 Other lack of coordination: Secondary | ICD-10-CM | POA: Diagnosis not present

## 2020-11-30 DIAGNOSIS — M6389 Disorders of muscle in diseases classified elsewhere, multiple sites: Secondary | ICD-10-CM | POA: Diagnosis not present

## 2020-11-30 DIAGNOSIS — R278 Other lack of coordination: Secondary | ICD-10-CM | POA: Diagnosis not present

## 2020-11-30 DIAGNOSIS — R2689 Other abnormalities of gait and mobility: Secondary | ICD-10-CM | POA: Diagnosis not present

## 2020-11-30 DIAGNOSIS — M62562 Muscle wasting and atrophy, not elsewhere classified, left lower leg: Secondary | ICD-10-CM | POA: Diagnosis not present

## 2020-11-30 DIAGNOSIS — Z9181 History of falling: Secondary | ICD-10-CM | POA: Diagnosis not present

## 2020-11-30 DIAGNOSIS — I6389 Other cerebral infarction: Secondary | ICD-10-CM | POA: Diagnosis not present

## 2020-11-30 DIAGNOSIS — M84350S Stress fracture, pelvis, sequela: Secondary | ICD-10-CM | POA: Diagnosis not present

## 2020-12-01 DIAGNOSIS — Z9181 History of falling: Secondary | ICD-10-CM | POA: Diagnosis not present

## 2020-12-01 DIAGNOSIS — M6389 Disorders of muscle in diseases classified elsewhere, multiple sites: Secondary | ICD-10-CM | POA: Diagnosis not present

## 2020-12-01 DIAGNOSIS — I6389 Other cerebral infarction: Secondary | ICD-10-CM | POA: Diagnosis not present

## 2020-12-01 DIAGNOSIS — M84350S Stress fracture, pelvis, sequela: Secondary | ICD-10-CM | POA: Diagnosis not present

## 2020-12-01 DIAGNOSIS — M62562 Muscle wasting and atrophy, not elsewhere classified, left lower leg: Secondary | ICD-10-CM | POA: Diagnosis not present

## 2020-12-01 DIAGNOSIS — R2689 Other abnormalities of gait and mobility: Secondary | ICD-10-CM | POA: Diagnosis not present

## 2020-12-01 DIAGNOSIS — R278 Other lack of coordination: Secondary | ICD-10-CM | POA: Diagnosis not present

## 2020-12-02 DIAGNOSIS — R2689 Other abnormalities of gait and mobility: Secondary | ICD-10-CM | POA: Diagnosis not present

## 2020-12-02 DIAGNOSIS — M62562 Muscle wasting and atrophy, not elsewhere classified, left lower leg: Secondary | ICD-10-CM | POA: Diagnosis not present

## 2020-12-02 DIAGNOSIS — I6389 Other cerebral infarction: Secondary | ICD-10-CM | POA: Diagnosis not present

## 2020-12-02 DIAGNOSIS — M84350S Stress fracture, pelvis, sequela: Secondary | ICD-10-CM | POA: Diagnosis not present

## 2020-12-02 DIAGNOSIS — M6389 Disorders of muscle in diseases classified elsewhere, multiple sites: Secondary | ICD-10-CM | POA: Diagnosis not present

## 2020-12-02 DIAGNOSIS — Z9181 History of falling: Secondary | ICD-10-CM | POA: Diagnosis not present

## 2020-12-02 DIAGNOSIS — R278 Other lack of coordination: Secondary | ICD-10-CM | POA: Diagnosis not present

## 2020-12-03 ENCOUNTER — Ambulatory Visit (INDEPENDENT_AMBULATORY_CARE_PROVIDER_SITE_OTHER): Payer: Medicare Other

## 2020-12-03 DIAGNOSIS — I6389 Other cerebral infarction: Secondary | ICD-10-CM | POA: Diagnosis not present

## 2020-12-03 DIAGNOSIS — R278 Other lack of coordination: Secondary | ICD-10-CM | POA: Diagnosis not present

## 2020-12-03 DIAGNOSIS — M6389 Disorders of muscle in diseases classified elsewhere, multiple sites: Secondary | ICD-10-CM | POA: Diagnosis not present

## 2020-12-03 DIAGNOSIS — M84350S Stress fracture, pelvis, sequela: Secondary | ICD-10-CM | POA: Diagnosis not present

## 2020-12-03 DIAGNOSIS — Z9181 History of falling: Secondary | ICD-10-CM | POA: Diagnosis not present

## 2020-12-03 DIAGNOSIS — R2689 Other abnormalities of gait and mobility: Secondary | ICD-10-CM | POA: Diagnosis not present

## 2020-12-03 DIAGNOSIS — I472 Ventricular tachycardia, unspecified: Secondary | ICD-10-CM

## 2020-12-03 DIAGNOSIS — M62562 Muscle wasting and atrophy, not elsewhere classified, left lower leg: Secondary | ICD-10-CM | POA: Diagnosis not present

## 2020-12-03 LAB — CUP PACEART REMOTE DEVICE CHECK
Battery Remaining Longevity: 137 mo
Battery Remaining Percentage: 95.5 %
Battery Voltage: 3.02 V
Brady Statistic RV Percent Paced: 1.5 %
Date Time Interrogation Session: 20220128020014
Implantable Lead Implant Date: 20200720
Implantable Lead Location: 753860
Implantable Pulse Generator Implant Date: 20200720
Lead Channel Impedance Value: 540 Ohm
Lead Channel Pacing Threshold Amplitude: 1 V
Lead Channel Pacing Threshold Pulse Width: 0.5 ms
Lead Channel Sensing Intrinsic Amplitude: 7.9 mV
Lead Channel Setting Pacing Amplitude: 2.5 V
Lead Channel Setting Pacing Pulse Width: 0.5 ms
Lead Channel Setting Sensing Sensitivity: 2 mV
Pulse Gen Model: 1272
Pulse Gen Serial Number: 9122450

## 2020-12-06 DIAGNOSIS — R278 Other lack of coordination: Secondary | ICD-10-CM | POA: Diagnosis not present

## 2020-12-06 DIAGNOSIS — M6389 Disorders of muscle in diseases classified elsewhere, multiple sites: Secondary | ICD-10-CM | POA: Diagnosis not present

## 2020-12-06 DIAGNOSIS — I6389 Other cerebral infarction: Secondary | ICD-10-CM | POA: Diagnosis not present

## 2020-12-06 DIAGNOSIS — Z9181 History of falling: Secondary | ICD-10-CM | POA: Diagnosis not present

## 2020-12-06 DIAGNOSIS — R2689 Other abnormalities of gait and mobility: Secondary | ICD-10-CM | POA: Diagnosis not present

## 2020-12-06 DIAGNOSIS — M62562 Muscle wasting and atrophy, not elsewhere classified, left lower leg: Secondary | ICD-10-CM | POA: Diagnosis not present

## 2020-12-06 DIAGNOSIS — M84350S Stress fracture, pelvis, sequela: Secondary | ICD-10-CM | POA: Diagnosis not present

## 2020-12-07 DIAGNOSIS — I6389 Other cerebral infarction: Secondary | ICD-10-CM | POA: Diagnosis not present

## 2020-12-07 DIAGNOSIS — M62562 Muscle wasting and atrophy, not elsewhere classified, left lower leg: Secondary | ICD-10-CM | POA: Diagnosis not present

## 2020-12-07 DIAGNOSIS — M84350S Stress fracture, pelvis, sequela: Secondary | ICD-10-CM | POA: Diagnosis not present

## 2020-12-07 DIAGNOSIS — R278 Other lack of coordination: Secondary | ICD-10-CM | POA: Diagnosis not present

## 2020-12-07 DIAGNOSIS — Z9181 History of falling: Secondary | ICD-10-CM | POA: Diagnosis not present

## 2020-12-07 DIAGNOSIS — M6389 Disorders of muscle in diseases classified elsewhere, multiple sites: Secondary | ICD-10-CM | POA: Diagnosis not present

## 2020-12-07 DIAGNOSIS — R2689 Other abnormalities of gait and mobility: Secondary | ICD-10-CM | POA: Diagnosis not present

## 2020-12-08 DIAGNOSIS — I6389 Other cerebral infarction: Secondary | ICD-10-CM | POA: Diagnosis not present

## 2020-12-08 DIAGNOSIS — M62562 Muscle wasting and atrophy, not elsewhere classified, left lower leg: Secondary | ICD-10-CM | POA: Diagnosis not present

## 2020-12-08 DIAGNOSIS — R278 Other lack of coordination: Secondary | ICD-10-CM | POA: Diagnosis not present

## 2020-12-08 DIAGNOSIS — R2689 Other abnormalities of gait and mobility: Secondary | ICD-10-CM | POA: Diagnosis not present

## 2020-12-08 DIAGNOSIS — M6389 Disorders of muscle in diseases classified elsewhere, multiple sites: Secondary | ICD-10-CM | POA: Diagnosis not present

## 2020-12-08 DIAGNOSIS — M84350S Stress fracture, pelvis, sequela: Secondary | ICD-10-CM | POA: Diagnosis not present

## 2020-12-08 DIAGNOSIS — Z9181 History of falling: Secondary | ICD-10-CM | POA: Diagnosis not present

## 2020-12-09 DIAGNOSIS — R2689 Other abnormalities of gait and mobility: Secondary | ICD-10-CM | POA: Diagnosis not present

## 2020-12-09 DIAGNOSIS — M62562 Muscle wasting and atrophy, not elsewhere classified, left lower leg: Secondary | ICD-10-CM | POA: Diagnosis not present

## 2020-12-09 DIAGNOSIS — I6389 Other cerebral infarction: Secondary | ICD-10-CM | POA: Diagnosis not present

## 2020-12-09 DIAGNOSIS — M6389 Disorders of muscle in diseases classified elsewhere, multiple sites: Secondary | ICD-10-CM | POA: Diagnosis not present

## 2020-12-09 DIAGNOSIS — I442 Atrioventricular block, complete: Secondary | ICD-10-CM | POA: Diagnosis not present

## 2020-12-09 DIAGNOSIS — I10 Essential (primary) hypertension: Secondary | ICD-10-CM | POA: Diagnosis not present

## 2020-12-09 DIAGNOSIS — Z9181 History of falling: Secondary | ICD-10-CM | POA: Diagnosis not present

## 2020-12-09 DIAGNOSIS — M84350S Stress fracture, pelvis, sequela: Secondary | ICD-10-CM | POA: Diagnosis not present

## 2020-12-09 DIAGNOSIS — R278 Other lack of coordination: Secondary | ICD-10-CM | POA: Diagnosis not present

## 2020-12-10 DIAGNOSIS — R278 Other lack of coordination: Secondary | ICD-10-CM | POA: Diagnosis not present

## 2020-12-10 DIAGNOSIS — Z9181 History of falling: Secondary | ICD-10-CM | POA: Diagnosis not present

## 2020-12-10 DIAGNOSIS — M84350S Stress fracture, pelvis, sequela: Secondary | ICD-10-CM | POA: Diagnosis not present

## 2020-12-10 DIAGNOSIS — I6389 Other cerebral infarction: Secondary | ICD-10-CM | POA: Diagnosis not present

## 2020-12-10 DIAGNOSIS — M6389 Disorders of muscle in diseases classified elsewhere, multiple sites: Secondary | ICD-10-CM | POA: Diagnosis not present

## 2020-12-11 NOTE — Progress Notes (Signed)
Remote pacemaker transmission.   

## 2020-12-13 DIAGNOSIS — Z9181 History of falling: Secondary | ICD-10-CM | POA: Diagnosis not present

## 2020-12-13 DIAGNOSIS — I6389 Other cerebral infarction: Secondary | ICD-10-CM | POA: Diagnosis not present

## 2020-12-13 DIAGNOSIS — R278 Other lack of coordination: Secondary | ICD-10-CM | POA: Diagnosis not present

## 2020-12-13 DIAGNOSIS — M6389 Disorders of muscle in diseases classified elsewhere, multiple sites: Secondary | ICD-10-CM | POA: Diagnosis not present

## 2020-12-13 DIAGNOSIS — M62562 Muscle wasting and atrophy, not elsewhere classified, left lower leg: Secondary | ICD-10-CM | POA: Diagnosis not present

## 2020-12-13 DIAGNOSIS — M84350S Stress fracture, pelvis, sequela: Secondary | ICD-10-CM | POA: Diagnosis not present

## 2020-12-13 DIAGNOSIS — R2689 Other abnormalities of gait and mobility: Secondary | ICD-10-CM | POA: Diagnosis not present

## 2020-12-14 DIAGNOSIS — R2689 Other abnormalities of gait and mobility: Secondary | ICD-10-CM | POA: Diagnosis not present

## 2020-12-14 DIAGNOSIS — R278 Other lack of coordination: Secondary | ICD-10-CM | POA: Diagnosis not present

## 2020-12-14 DIAGNOSIS — M6389 Disorders of muscle in diseases classified elsewhere, multiple sites: Secondary | ICD-10-CM | POA: Diagnosis not present

## 2020-12-14 DIAGNOSIS — M62562 Muscle wasting and atrophy, not elsewhere classified, left lower leg: Secondary | ICD-10-CM | POA: Diagnosis not present

## 2020-12-14 DIAGNOSIS — M84350S Stress fracture, pelvis, sequela: Secondary | ICD-10-CM | POA: Diagnosis not present

## 2020-12-14 DIAGNOSIS — I6389 Other cerebral infarction: Secondary | ICD-10-CM | POA: Diagnosis not present

## 2020-12-14 DIAGNOSIS — Z9181 History of falling: Secondary | ICD-10-CM | POA: Diagnosis not present

## 2020-12-15 DIAGNOSIS — Z9181 History of falling: Secondary | ICD-10-CM | POA: Diagnosis not present

## 2020-12-15 DIAGNOSIS — I6389 Other cerebral infarction: Secondary | ICD-10-CM | POA: Diagnosis not present

## 2020-12-15 DIAGNOSIS — M62562 Muscle wasting and atrophy, not elsewhere classified, left lower leg: Secondary | ICD-10-CM | POA: Diagnosis not present

## 2020-12-15 DIAGNOSIS — M6389 Disorders of muscle in diseases classified elsewhere, multiple sites: Secondary | ICD-10-CM | POA: Diagnosis not present

## 2020-12-15 DIAGNOSIS — R2689 Other abnormalities of gait and mobility: Secondary | ICD-10-CM | POA: Diagnosis not present

## 2020-12-15 DIAGNOSIS — R278 Other lack of coordination: Secondary | ICD-10-CM | POA: Diagnosis not present

## 2020-12-15 DIAGNOSIS — M84350S Stress fracture, pelvis, sequela: Secondary | ICD-10-CM | POA: Diagnosis not present

## 2020-12-16 DIAGNOSIS — Z9181 History of falling: Secondary | ICD-10-CM | POA: Diagnosis not present

## 2020-12-16 DIAGNOSIS — M62562 Muscle wasting and atrophy, not elsewhere classified, left lower leg: Secondary | ICD-10-CM | POA: Diagnosis not present

## 2020-12-16 DIAGNOSIS — M6389 Disorders of muscle in diseases classified elsewhere, multiple sites: Secondary | ICD-10-CM | POA: Diagnosis not present

## 2020-12-16 DIAGNOSIS — M84350S Stress fracture, pelvis, sequela: Secondary | ICD-10-CM | POA: Diagnosis not present

## 2020-12-16 DIAGNOSIS — R278 Other lack of coordination: Secondary | ICD-10-CM | POA: Diagnosis not present

## 2020-12-16 DIAGNOSIS — I6389 Other cerebral infarction: Secondary | ICD-10-CM | POA: Diagnosis not present

## 2020-12-16 DIAGNOSIS — R2689 Other abnormalities of gait and mobility: Secondary | ICD-10-CM | POA: Diagnosis not present

## 2020-12-17 DIAGNOSIS — M6389 Disorders of muscle in diseases classified elsewhere, multiple sites: Secondary | ICD-10-CM | POA: Diagnosis not present

## 2020-12-17 DIAGNOSIS — R1312 Dysphagia, oropharyngeal phase: Secondary | ICD-10-CM | POA: Diagnosis not present

## 2020-12-17 DIAGNOSIS — I6389 Other cerebral infarction: Secondary | ICD-10-CM | POA: Diagnosis not present

## 2020-12-17 DIAGNOSIS — R278 Other lack of coordination: Secondary | ICD-10-CM | POA: Diagnosis not present

## 2020-12-17 DIAGNOSIS — K219 Gastro-esophageal reflux disease without esophagitis: Secondary | ICD-10-CM | POA: Diagnosis not present

## 2020-12-17 DIAGNOSIS — Z9181 History of falling: Secondary | ICD-10-CM | POA: Diagnosis not present

## 2020-12-17 DIAGNOSIS — M84350S Stress fracture, pelvis, sequela: Secondary | ICD-10-CM | POA: Diagnosis not present

## 2020-12-17 DIAGNOSIS — I69354 Hemiplegia and hemiparesis following cerebral infarction affecting left non-dominant side: Secondary | ICD-10-CM | POA: Diagnosis not present

## 2020-12-17 DIAGNOSIS — I69322 Dysarthria following cerebral infarction: Secondary | ICD-10-CM | POA: Diagnosis not present

## 2020-12-18 DIAGNOSIS — I6389 Other cerebral infarction: Secondary | ICD-10-CM | POA: Diagnosis not present

## 2020-12-18 DIAGNOSIS — M84350S Stress fracture, pelvis, sequela: Secondary | ICD-10-CM | POA: Diagnosis not present

## 2020-12-18 DIAGNOSIS — M62562 Muscle wasting and atrophy, not elsewhere classified, left lower leg: Secondary | ICD-10-CM | POA: Diagnosis not present

## 2020-12-18 DIAGNOSIS — R278 Other lack of coordination: Secondary | ICD-10-CM | POA: Diagnosis not present

## 2020-12-18 DIAGNOSIS — Z9181 History of falling: Secondary | ICD-10-CM | POA: Diagnosis not present

## 2020-12-18 DIAGNOSIS — R2689 Other abnormalities of gait and mobility: Secondary | ICD-10-CM | POA: Diagnosis not present

## 2020-12-19 DIAGNOSIS — M84350S Stress fracture, pelvis, sequela: Secondary | ICD-10-CM | POA: Diagnosis not present

## 2020-12-19 DIAGNOSIS — R278 Other lack of coordination: Secondary | ICD-10-CM | POA: Diagnosis not present

## 2020-12-19 DIAGNOSIS — R2689 Other abnormalities of gait and mobility: Secondary | ICD-10-CM | POA: Diagnosis not present

## 2020-12-19 DIAGNOSIS — I6389 Other cerebral infarction: Secondary | ICD-10-CM | POA: Diagnosis not present

## 2020-12-19 DIAGNOSIS — M62562 Muscle wasting and atrophy, not elsewhere classified, left lower leg: Secondary | ICD-10-CM | POA: Diagnosis not present

## 2020-12-19 DIAGNOSIS — Z9181 History of falling: Secondary | ICD-10-CM | POA: Diagnosis not present

## 2020-12-20 DIAGNOSIS — Z9181 History of falling: Secondary | ICD-10-CM | POA: Diagnosis not present

## 2020-12-20 DIAGNOSIS — M6389 Disorders of muscle in diseases classified elsewhere, multiple sites: Secondary | ICD-10-CM | POA: Diagnosis not present

## 2020-12-20 DIAGNOSIS — M84350S Stress fracture, pelvis, sequela: Secondary | ICD-10-CM | POA: Diagnosis not present

## 2020-12-20 DIAGNOSIS — I69354 Hemiplegia and hemiparesis following cerebral infarction affecting left non-dominant side: Secondary | ICD-10-CM | POA: Diagnosis not present

## 2020-12-20 DIAGNOSIS — K219 Gastro-esophageal reflux disease without esophagitis: Secondary | ICD-10-CM | POA: Diagnosis not present

## 2020-12-20 DIAGNOSIS — I6389 Other cerebral infarction: Secondary | ICD-10-CM | POA: Diagnosis not present

## 2020-12-20 DIAGNOSIS — I69322 Dysarthria following cerebral infarction: Secondary | ICD-10-CM | POA: Diagnosis not present

## 2020-12-20 DIAGNOSIS — R278 Other lack of coordination: Secondary | ICD-10-CM | POA: Diagnosis not present

## 2020-12-20 DIAGNOSIS — R1312 Dysphagia, oropharyngeal phase: Secondary | ICD-10-CM | POA: Diagnosis not present

## 2020-12-20 DIAGNOSIS — R2689 Other abnormalities of gait and mobility: Secondary | ICD-10-CM | POA: Diagnosis not present

## 2020-12-20 DIAGNOSIS — M62562 Muscle wasting and atrophy, not elsewhere classified, left lower leg: Secondary | ICD-10-CM | POA: Diagnosis not present

## 2020-12-21 DIAGNOSIS — R278 Other lack of coordination: Secondary | ICD-10-CM | POA: Diagnosis not present

## 2020-12-21 DIAGNOSIS — I69322 Dysarthria following cerebral infarction: Secondary | ICD-10-CM | POA: Diagnosis not present

## 2020-12-21 DIAGNOSIS — M84350S Stress fracture, pelvis, sequela: Secondary | ICD-10-CM | POA: Diagnosis not present

## 2020-12-21 DIAGNOSIS — I69354 Hemiplegia and hemiparesis following cerebral infarction affecting left non-dominant side: Secondary | ICD-10-CM | POA: Diagnosis not present

## 2020-12-21 DIAGNOSIS — I6389 Other cerebral infarction: Secondary | ICD-10-CM | POA: Diagnosis not present

## 2020-12-21 DIAGNOSIS — K219 Gastro-esophageal reflux disease without esophagitis: Secondary | ICD-10-CM | POA: Diagnosis not present

## 2020-12-21 DIAGNOSIS — R1312 Dysphagia, oropharyngeal phase: Secondary | ICD-10-CM | POA: Diagnosis not present

## 2020-12-21 DIAGNOSIS — Z9181 History of falling: Secondary | ICD-10-CM | POA: Diagnosis not present

## 2020-12-21 DIAGNOSIS — M6389 Disorders of muscle in diseases classified elsewhere, multiple sites: Secondary | ICD-10-CM | POA: Diagnosis not present

## 2020-12-22 DIAGNOSIS — M62562 Muscle wasting and atrophy, not elsewhere classified, left lower leg: Secondary | ICD-10-CM | POA: Diagnosis not present

## 2020-12-22 DIAGNOSIS — R1312 Dysphagia, oropharyngeal phase: Secondary | ICD-10-CM | POA: Diagnosis not present

## 2020-12-22 DIAGNOSIS — I6389 Other cerebral infarction: Secondary | ICD-10-CM | POA: Diagnosis not present

## 2020-12-22 DIAGNOSIS — K219 Gastro-esophageal reflux disease without esophagitis: Secondary | ICD-10-CM | POA: Diagnosis not present

## 2020-12-22 DIAGNOSIS — I69354 Hemiplegia and hemiparesis following cerebral infarction affecting left non-dominant side: Secondary | ICD-10-CM | POA: Diagnosis not present

## 2020-12-22 DIAGNOSIS — Z9181 History of falling: Secondary | ICD-10-CM | POA: Diagnosis not present

## 2020-12-22 DIAGNOSIS — R278 Other lack of coordination: Secondary | ICD-10-CM | POA: Diagnosis not present

## 2020-12-22 DIAGNOSIS — I69322 Dysarthria following cerebral infarction: Secondary | ICD-10-CM | POA: Diagnosis not present

## 2020-12-22 DIAGNOSIS — R2689 Other abnormalities of gait and mobility: Secondary | ICD-10-CM | POA: Diagnosis not present

## 2020-12-22 DIAGNOSIS — M84350S Stress fracture, pelvis, sequela: Secondary | ICD-10-CM | POA: Diagnosis not present

## 2020-12-23 DIAGNOSIS — I6389 Other cerebral infarction: Secondary | ICD-10-CM | POA: Diagnosis not present

## 2020-12-23 DIAGNOSIS — Z9181 History of falling: Secondary | ICD-10-CM | POA: Diagnosis not present

## 2020-12-23 DIAGNOSIS — R2689 Other abnormalities of gait and mobility: Secondary | ICD-10-CM | POA: Diagnosis not present

## 2020-12-23 DIAGNOSIS — M62562 Muscle wasting and atrophy, not elsewhere classified, left lower leg: Secondary | ICD-10-CM | POA: Diagnosis not present

## 2020-12-23 DIAGNOSIS — M84350S Stress fracture, pelvis, sequela: Secondary | ICD-10-CM | POA: Diagnosis not present

## 2020-12-23 DIAGNOSIS — M6389 Disorders of muscle in diseases classified elsewhere, multiple sites: Secondary | ICD-10-CM | POA: Diagnosis not present

## 2020-12-23 DIAGNOSIS — R278 Other lack of coordination: Secondary | ICD-10-CM | POA: Diagnosis not present

## 2020-12-24 DIAGNOSIS — I6389 Other cerebral infarction: Secondary | ICD-10-CM | POA: Diagnosis not present

## 2020-12-24 DIAGNOSIS — M84350S Stress fracture, pelvis, sequela: Secondary | ICD-10-CM | POA: Diagnosis not present

## 2020-12-24 DIAGNOSIS — M6389 Disorders of muscle in diseases classified elsewhere, multiple sites: Secondary | ICD-10-CM | POA: Diagnosis not present

## 2020-12-24 DIAGNOSIS — R278 Other lack of coordination: Secondary | ICD-10-CM | POA: Diagnosis not present

## 2020-12-24 DIAGNOSIS — Z9181 History of falling: Secondary | ICD-10-CM | POA: Diagnosis not present

## 2020-12-25 DIAGNOSIS — I6389 Other cerebral infarction: Secondary | ICD-10-CM | POA: Diagnosis not present

## 2020-12-25 DIAGNOSIS — R278 Other lack of coordination: Secondary | ICD-10-CM | POA: Diagnosis not present

## 2020-12-25 DIAGNOSIS — M84350S Stress fracture, pelvis, sequela: Secondary | ICD-10-CM | POA: Diagnosis not present

## 2020-12-25 DIAGNOSIS — Z9181 History of falling: Secondary | ICD-10-CM | POA: Diagnosis not present

## 2020-12-25 DIAGNOSIS — R2689 Other abnormalities of gait and mobility: Secondary | ICD-10-CM | POA: Diagnosis not present

## 2020-12-25 DIAGNOSIS — M62562 Muscle wasting and atrophy, not elsewhere classified, left lower leg: Secondary | ICD-10-CM | POA: Diagnosis not present

## 2020-12-26 DIAGNOSIS — R278 Other lack of coordination: Secondary | ICD-10-CM | POA: Diagnosis not present

## 2020-12-26 DIAGNOSIS — R2689 Other abnormalities of gait and mobility: Secondary | ICD-10-CM | POA: Diagnosis not present

## 2020-12-26 DIAGNOSIS — Z9181 History of falling: Secondary | ICD-10-CM | POA: Diagnosis not present

## 2020-12-26 DIAGNOSIS — I6389 Other cerebral infarction: Secondary | ICD-10-CM | POA: Diagnosis not present

## 2020-12-26 DIAGNOSIS — M84350S Stress fracture, pelvis, sequela: Secondary | ICD-10-CM | POA: Diagnosis not present

## 2020-12-26 DIAGNOSIS — M62562 Muscle wasting and atrophy, not elsewhere classified, left lower leg: Secondary | ICD-10-CM | POA: Diagnosis not present

## 2020-12-27 ENCOUNTER — Encounter: Payer: Self-pay | Admitting: Internal Medicine

## 2020-12-27 DIAGNOSIS — I6389 Other cerebral infarction: Secondary | ICD-10-CM | POA: Diagnosis not present

## 2020-12-27 DIAGNOSIS — M84350S Stress fracture, pelvis, sequela: Secondary | ICD-10-CM | POA: Diagnosis not present

## 2020-12-27 DIAGNOSIS — K219 Gastro-esophageal reflux disease without esophagitis: Secondary | ICD-10-CM | POA: Diagnosis not present

## 2020-12-27 DIAGNOSIS — I69354 Hemiplegia and hemiparesis following cerebral infarction affecting left non-dominant side: Secondary | ICD-10-CM | POA: Diagnosis not present

## 2020-12-27 DIAGNOSIS — Z9181 History of falling: Secondary | ICD-10-CM | POA: Diagnosis not present

## 2020-12-27 DIAGNOSIS — I69322 Dysarthria following cerebral infarction: Secondary | ICD-10-CM | POA: Diagnosis not present

## 2020-12-27 DIAGNOSIS — R1312 Dysphagia, oropharyngeal phase: Secondary | ICD-10-CM | POA: Diagnosis not present

## 2020-12-27 DIAGNOSIS — M6389 Disorders of muscle in diseases classified elsewhere, multiple sites: Secondary | ICD-10-CM | POA: Diagnosis not present

## 2020-12-27 DIAGNOSIS — M62562 Muscle wasting and atrophy, not elsewhere classified, left lower leg: Secondary | ICD-10-CM | POA: Diagnosis not present

## 2020-12-27 DIAGNOSIS — R278 Other lack of coordination: Secondary | ICD-10-CM | POA: Diagnosis not present

## 2020-12-27 DIAGNOSIS — R2689 Other abnormalities of gait and mobility: Secondary | ICD-10-CM | POA: Diagnosis not present

## 2020-12-28 DIAGNOSIS — R2689 Other abnormalities of gait and mobility: Secondary | ICD-10-CM | POA: Diagnosis not present

## 2020-12-28 DIAGNOSIS — R278 Other lack of coordination: Secondary | ICD-10-CM | POA: Diagnosis not present

## 2020-12-28 DIAGNOSIS — M84350S Stress fracture, pelvis, sequela: Secondary | ICD-10-CM | POA: Diagnosis not present

## 2020-12-28 DIAGNOSIS — M62562 Muscle wasting and atrophy, not elsewhere classified, left lower leg: Secondary | ICD-10-CM | POA: Diagnosis not present

## 2020-12-28 DIAGNOSIS — Z9181 History of falling: Secondary | ICD-10-CM | POA: Diagnosis not present

## 2020-12-28 DIAGNOSIS — M6389 Disorders of muscle in diseases classified elsewhere, multiple sites: Secondary | ICD-10-CM | POA: Diagnosis not present

## 2020-12-28 DIAGNOSIS — I6389 Other cerebral infarction: Secondary | ICD-10-CM | POA: Diagnosis not present

## 2020-12-29 DIAGNOSIS — I6389 Other cerebral infarction: Secondary | ICD-10-CM | POA: Diagnosis not present

## 2020-12-29 DIAGNOSIS — M84350S Stress fracture, pelvis, sequela: Secondary | ICD-10-CM | POA: Diagnosis not present

## 2020-12-29 DIAGNOSIS — Z9181 History of falling: Secondary | ICD-10-CM | POA: Diagnosis not present

## 2020-12-29 DIAGNOSIS — R278 Other lack of coordination: Secondary | ICD-10-CM | POA: Diagnosis not present

## 2020-12-29 DIAGNOSIS — M6389 Disorders of muscle in diseases classified elsewhere, multiple sites: Secondary | ICD-10-CM | POA: Diagnosis not present

## 2020-12-30 DIAGNOSIS — Z9181 History of falling: Secondary | ICD-10-CM | POA: Diagnosis not present

## 2020-12-30 DIAGNOSIS — I6389 Other cerebral infarction: Secondary | ICD-10-CM | POA: Diagnosis not present

## 2020-12-30 DIAGNOSIS — M6389 Disorders of muscle in diseases classified elsewhere, multiple sites: Secondary | ICD-10-CM | POA: Diagnosis not present

## 2020-12-30 DIAGNOSIS — R2689 Other abnormalities of gait and mobility: Secondary | ICD-10-CM | POA: Diagnosis not present

## 2020-12-30 DIAGNOSIS — R278 Other lack of coordination: Secondary | ICD-10-CM | POA: Diagnosis not present

## 2020-12-30 DIAGNOSIS — M84350S Stress fracture, pelvis, sequela: Secondary | ICD-10-CM | POA: Diagnosis not present

## 2020-12-30 DIAGNOSIS — M62562 Muscle wasting and atrophy, not elsewhere classified, left lower leg: Secondary | ICD-10-CM | POA: Diagnosis not present

## 2020-12-31 DIAGNOSIS — M6389 Disorders of muscle in diseases classified elsewhere, multiple sites: Secondary | ICD-10-CM | POA: Diagnosis not present

## 2020-12-31 DIAGNOSIS — I6389 Other cerebral infarction: Secondary | ICD-10-CM | POA: Diagnosis not present

## 2020-12-31 DIAGNOSIS — M84350S Stress fracture, pelvis, sequela: Secondary | ICD-10-CM | POA: Diagnosis not present

## 2020-12-31 DIAGNOSIS — R278 Other lack of coordination: Secondary | ICD-10-CM | POA: Diagnosis not present

## 2020-12-31 DIAGNOSIS — Z9181 History of falling: Secondary | ICD-10-CM | POA: Diagnosis not present

## 2021-01-01 DIAGNOSIS — R278 Other lack of coordination: Secondary | ICD-10-CM | POA: Diagnosis not present

## 2021-01-01 DIAGNOSIS — Z9181 History of falling: Secondary | ICD-10-CM | POA: Diagnosis not present

## 2021-01-01 DIAGNOSIS — R2689 Other abnormalities of gait and mobility: Secondary | ICD-10-CM | POA: Diagnosis not present

## 2021-01-01 DIAGNOSIS — I6389 Other cerebral infarction: Secondary | ICD-10-CM | POA: Diagnosis not present

## 2021-01-01 DIAGNOSIS — M84350S Stress fracture, pelvis, sequela: Secondary | ICD-10-CM | POA: Diagnosis not present

## 2021-01-01 DIAGNOSIS — M62562 Muscle wasting and atrophy, not elsewhere classified, left lower leg: Secondary | ICD-10-CM | POA: Diagnosis not present

## 2021-01-02 DIAGNOSIS — R2689 Other abnormalities of gait and mobility: Secondary | ICD-10-CM | POA: Diagnosis not present

## 2021-01-02 DIAGNOSIS — M84350S Stress fracture, pelvis, sequela: Secondary | ICD-10-CM | POA: Diagnosis not present

## 2021-01-02 DIAGNOSIS — M62562 Muscle wasting and atrophy, not elsewhere classified, left lower leg: Secondary | ICD-10-CM | POA: Diagnosis not present

## 2021-01-02 DIAGNOSIS — R278 Other lack of coordination: Secondary | ICD-10-CM | POA: Diagnosis not present

## 2021-01-02 DIAGNOSIS — Z9181 History of falling: Secondary | ICD-10-CM | POA: Diagnosis not present

## 2021-01-02 DIAGNOSIS — I6389 Other cerebral infarction: Secondary | ICD-10-CM | POA: Diagnosis not present

## 2021-01-03 DIAGNOSIS — M6389 Disorders of muscle in diseases classified elsewhere, multiple sites: Secondary | ICD-10-CM | POA: Diagnosis not present

## 2021-01-03 DIAGNOSIS — I6389 Other cerebral infarction: Secondary | ICD-10-CM | POA: Diagnosis not present

## 2021-01-03 DIAGNOSIS — R2689 Other abnormalities of gait and mobility: Secondary | ICD-10-CM | POA: Diagnosis not present

## 2021-01-03 DIAGNOSIS — M62562 Muscle wasting and atrophy, not elsewhere classified, left lower leg: Secondary | ICD-10-CM | POA: Diagnosis not present

## 2021-01-03 DIAGNOSIS — M84350S Stress fracture, pelvis, sequela: Secondary | ICD-10-CM | POA: Diagnosis not present

## 2021-01-03 DIAGNOSIS — R278 Other lack of coordination: Secondary | ICD-10-CM | POA: Diagnosis not present

## 2021-01-03 DIAGNOSIS — Z9181 History of falling: Secondary | ICD-10-CM | POA: Diagnosis not present

## 2021-01-04 DIAGNOSIS — Z9181 History of falling: Secondary | ICD-10-CM | POA: Diagnosis not present

## 2021-01-04 DIAGNOSIS — R2689 Other abnormalities of gait and mobility: Secondary | ICD-10-CM | POA: Diagnosis not present

## 2021-01-04 DIAGNOSIS — M84350S Stress fracture, pelvis, sequela: Secondary | ICD-10-CM | POA: Diagnosis not present

## 2021-01-04 DIAGNOSIS — M6389 Disorders of muscle in diseases classified elsewhere, multiple sites: Secondary | ICD-10-CM | POA: Diagnosis not present

## 2021-01-04 DIAGNOSIS — M62562 Muscle wasting and atrophy, not elsewhere classified, left lower leg: Secondary | ICD-10-CM | POA: Diagnosis not present

## 2021-01-04 DIAGNOSIS — I6389 Other cerebral infarction: Secondary | ICD-10-CM | POA: Diagnosis not present

## 2021-01-04 DIAGNOSIS — R278 Other lack of coordination: Secondary | ICD-10-CM | POA: Diagnosis not present

## 2021-01-05 DIAGNOSIS — R278 Other lack of coordination: Secondary | ICD-10-CM | POA: Diagnosis not present

## 2021-01-05 DIAGNOSIS — M6389 Disorders of muscle in diseases classified elsewhere, multiple sites: Secondary | ICD-10-CM | POA: Diagnosis not present

## 2021-01-05 DIAGNOSIS — I6389 Other cerebral infarction: Secondary | ICD-10-CM | POA: Diagnosis not present

## 2021-01-05 DIAGNOSIS — M84350S Stress fracture, pelvis, sequela: Secondary | ICD-10-CM | POA: Diagnosis not present

## 2021-01-05 DIAGNOSIS — Z9181 History of falling: Secondary | ICD-10-CM | POA: Diagnosis not present

## 2021-01-06 ENCOUNTER — Encounter: Payer: Self-pay | Admitting: Adult Health

## 2021-01-06 ENCOUNTER — Non-Acute Institutional Stay (SKILLED_NURSING_FACILITY): Payer: Medicare Other | Admitting: Adult Health

## 2021-01-06 DIAGNOSIS — M25561 Pain in right knee: Secondary | ICD-10-CM | POA: Diagnosis not present

## 2021-01-06 DIAGNOSIS — I4821 Permanent atrial fibrillation: Secondary | ICD-10-CM

## 2021-01-06 DIAGNOSIS — M62562 Muscle wasting and atrophy, not elsewhere classified, left lower leg: Secondary | ICD-10-CM | POA: Diagnosis not present

## 2021-01-06 DIAGNOSIS — Z9181 History of falling: Secondary | ICD-10-CM | POA: Diagnosis not present

## 2021-01-06 DIAGNOSIS — I10 Essential (primary) hypertension: Secondary | ICD-10-CM

## 2021-01-06 DIAGNOSIS — I6389 Other cerebral infarction: Secondary | ICD-10-CM | POA: Diagnosis not present

## 2021-01-06 DIAGNOSIS — M84350S Stress fracture, pelvis, sequela: Secondary | ICD-10-CM | POA: Diagnosis not present

## 2021-01-06 DIAGNOSIS — R2689 Other abnormalities of gait and mobility: Secondary | ICD-10-CM | POA: Diagnosis not present

## 2021-01-06 DIAGNOSIS — M6389 Disorders of muscle in diseases classified elsewhere, multiple sites: Secondary | ICD-10-CM | POA: Diagnosis not present

## 2021-01-06 DIAGNOSIS — I69354 Hemiplegia and hemiparesis following cerebral infarction affecting left non-dominant side: Secondary | ICD-10-CM

## 2021-01-06 DIAGNOSIS — R278 Other lack of coordination: Secondary | ICD-10-CM | POA: Diagnosis not present

## 2021-01-06 DIAGNOSIS — R1312 Dysphagia, oropharyngeal phase: Secondary | ICD-10-CM

## 2021-01-06 DIAGNOSIS — I442 Atrioventricular block, complete: Secondary | ICD-10-CM

## 2021-01-06 NOTE — Progress Notes (Signed)
Location:   [facility] Room Number: 118-A Place of Service:  SNF [id number]) Provider:  Royal Hawthorn, NP    Patient Care Team: [name], DO as PCP - General (Geriatric Medicine) [name], MD as PCP - Electrophysiology (Cardiology) [name], MD as Consulting Physician (Dermatology) [name], MD as Consulting Physician (Cardiology) [facility] [name], MD as Consulting Physician (Dermatology)  Extended Emergency Contact Information Primary Emergency Contact: [name] of [place] Phone: [phone] Mobile Phone: [phone] Relation: Daughter Secondary Emergency Contact: [name],[name]  [name] of [place] Phone: [phone] Relation: Son  Code Status:  DNR Goals of care: Advanced Directive information Advanced Directives 01/06/2021  Does Patient Have a Medical Advance Directive? Yes  Type of Paramedic of [place];Living will;Out of facility DNR (pink MOST or yellow form)  Does patient want to make changes to medical advance directive? No - Patient declined  Copy of [place] in Chart? Yes - validated most recent copy scanned in chart (See row information)  Pre-existing out of facility DNR order (yellow form or pink MOST form) -     Chief Complaint  Patient presents with  . Medical Management of Chronic Issues    Routine Visit  . Immunizations    Discuss the need for Tetanus Vaccine, and Covid Vaccine.    HPI:  Pt is a 87 y.o. female seen today for medical management of chronic diseases.   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  She continues to make small gains with therapy. She is able to walk short distances with their assistance. Her BP is elevated at times in the 867-672 range systolic. She is not having any headaches or dizziness. She had dysphagia after the CVA but  has worked with [facility] and is now on a regular diet tolerating well.   Reports her right knee hurts after activity. She is bearing more weight on this side due to the left sided weakness.  Feels better with ultram and voltaren.   She is eating well and has gained 6 lbs in the past few months in skilled care.   Past Medical History:  Diagnosis Date  . Actinic keratosis 05/25/2014  . Anxiety   . Atrial fibrillation ([place]) 09/21/2010  . Basal cell carcinoma 05/25/2014   Multiple removed by Dr. Danella Sensing in March 2015: right neck, left neck, scalp   . Bell's palsy 07/18/1979  . Cervicalgia 01/31/2012  . Closed fracture of lumbar vertebra without mention of spinal cord injury 07/17/2005  . Conjunctiva disorder 12/26/2010  . Coronary atherosclerosis of native coronary artery 07/17/1997  . Cramp of limb 08/16/2009  . Degeneration of lumbar or lumbosacral intervertebral disc 01/31/2012  . Disturbance of skin sensation 08/2009  . Dizziness and giddiness 11/08/2011   vertigo  . External hemorrhoids without mention of complication 0/94/7096  . External hemorrhoids without mention of complication 28/36/6294  . Ganglion of tendon sheath 08/16/2009  . Leg cramp 10/27/2013   Most frequently the right leg.   . Long term (current) use of anticoagulants 09/2010  . Lumbago 07/2009  . Meralgia paresthetica 07/2007  . MI, old   . Myalgia and myositis, unspecified 01/17/2012  . Osteoporosis   . Other abnormal blood chemistry 04/08/2012  . Other abnormal blood chemistry 2013   hyperglycemia  . Other disorder of muscle, ligament, and fascia 04/08/2012  . Other specified  cardiac dysrhythmias(427.89) 06/27/2010  . Pacemaker 05/06/2020  . Pain in joint, ankle and foot 10/27/2013   Bilateral since 1998   . Pain in joint, shoulder region 08/12/2012  . Pain in joint, upper arm 12/26/2010  . Pain in limb 01/11/2011  . Pain in thoracic spine 01/31/2012  . 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 PVC's (premature  ventricular contractions)   . Rash and other nonspecific skin eruption 08/02/2011  . Senile osteoporosis 07/17/1993  . Stroke ([place])   . Unspecified essential hypertension 07/17/1997  . Varicose veins of lower extremities 08/16/2009  . Varicose veins of lower extremities 08/16/2009   Past Surgical History:  Procedure Laterality Date  . COLONOSCOPY WITH PROPOFOL N/A 10/19/2015   Procedure: COLONOSCOPY WITH PROPOFOL;  Surgeon: Gatha Mayer, MD;  Location: [facility];  Service: Endoscopy;  Laterality: N/A;  . CORONARY ARTERY BYPASS GRAFT  1998   x2; LIMA to LAD; SVG to diagonal off bypass  . HEMORRHOID SURGERY  08/26/2012   Dr. Brantley Stage  . LOOP RECORDER INSERTION N/A 05/13/2018   Procedure: LOOP RECORDER INSERTION;  Surgeon: [name], MD;  Location: [facility];  Service: Cardiovascular;  Laterality: N/A;  . LOOP RECORDER REMOVAL N/A 05/26/2019   Procedure: LOOP RECORDER REMOVAL;  Surgeon: [name], MD;  Location: [facility];  Service: Cardiovascular;  Laterality: N/A;  . MASS EXCISION Left 11/23/2015   Procedure: LEFT WRIST EXCISION CYST;  Surgeon: Leanora Cover, MD;  Location: [facility];  Service: Orthopedics;  Laterality: Left;  . PACEMAKER IMPLANT N/A 05/26/2019   Procedure: PACEMAKER IMPLANT;  Surgeon: [name], MD;  Location: [facility];  Service: Cardiovascular;  Laterality: N/A;  . SKIN BIOPSY  01/29/14   (R) neck; (R) scalp, 2 (L) neck; shave biopsy Superficial basal cell carcinoma Dr. Danella Sensing  . TONSILLECTOMY  1940's    Allergies  Allergen Reactions  . Iodinated Diagnostic Agents Nausea Only and Other (See Comments)    Severe nausea and also passed out  . Iodine   . Other     Seasonal Allergies.  . Bactrim Rash  . Relafen [Nabumetone] Rash  . Sulfanilamide Rash    Allergies as of 01/06/2021      Reactions   Iodinated Diagnostic Agents Nausea Only, Other (See Comments)   Severe nausea and also passed out   Iodine     Other    Seasonal Allergies.   Bactrim Rash   Relafen [nabumetone] Rash   Sulfanilamide Rash      Medication List       Accurate as of January 06, 2021 11:44 AM. If you have any questions, ask your nurse or doctor.        acetaminophen 500 MG tablet Commonly known as: TYLENOL Take 1,000 mg by mouth every 6 (six) hours as needed for mild pain (pain.).   aspirin 81 MG EC tablet Take 1 tablet (81 mg total) by mouth daily. Swallow whole.   atenolol 25 MG tablet Commonly known as: TENORMIN Take 1 tablet (25 mg total) by mouth 2 (two) times daily.   calcium-vitamin D 500-200 MG-UNIT tablet Commonly known as: OSCAL WITH D Take 1 tablet by mouth daily.   diclofenac Sodium 1 % Gel Commonly known as: VOLTAREN Apply 4 g topically 4 (four) times daily as needed. Left knee   digoxin 0.125 MG tablet Commonly known as: LANOXIN Take 0.125 mg by mouth daily.   hydrALAZINE 10 MG tablet Commonly  known as: APRESOLINE Take 10 mg by mouth 3 (three) times daily. TID PRN for SBP >170   LORazepam 1 MG tablet Commonly known as: ATIVAN Take 1 tablet (1 mg total) by mouth at bedtime.   ondansetron 4 MG tablet Commonly known as: ZOFRAN Take 1 tablet (4 mg total) by mouth every 6 (six) hours as needed for nausea.   polyethylene glycol 17 g packet Commonly known as: MIRALAX / GLYCOLAX Take 17 g by mouth every other day. What changed: Another medication with the same name was removed. Continue taking this medication, and follow the directions you see here. Changed by: Royal Hawthorn, NP   traMADol 50 MG tablet Commonly known as: ULTRAM Take 1 tablet (50 mg total) by mouth every 6 (six) hours as needed for moderate pain or severe pain.       Review of Systems  Constitutional: Positive for activity change. Negative for appetite change, chills, diaphoresis, fatigue, fever and unexpected weight change.  HENT: Negative for congestion and trouble swallowing.   Respiratory: Negative for  cough, shortness of breath and wheezing.   Cardiovascular: Negative for chest pain, palpitations and leg swelling.  Gastrointestinal: Negative for abdominal distention, abdominal pain, constipation and diarrhea.  Genitourinary: Negative for difficulty urinating and dysuria.  Musculoskeletal: Positive for gait problem. Negative for arthralgias, back pain, joint swelling and myalgias.  Neurological: Positive for facial asymmetry, speech difficulty and weakness. Negative for dizziness, tremors, seizures, syncope, light-headedness, numbness and headaches.  Psychiatric/Behavioral: Negative for agitation, behavioral problems and confusion.    Immunization History  Administered Date(s) Administered  . Influenza Whole 08/06/2012  . Influenza, High Dose Seasonal PF 08/15/2019, 08/27/2020  . Influenza,inj,Quad PF,6+ Mos 08/30/2018  . Influenza-Unspecified 08/06/2013, 08/24/2014, 08/26/2015, 08/31/2016, 08/27/2017  . Moderna Sars-Covid-2 Vaccination 11/16/2019, 12/16/2019  . Pneumococcal Conjugate-13 09/22/2015  . Pneumococcal Polysaccharide-23 08/29/2006  . Td 10/07/2007  . Zoster 03/12/2008  . Zoster Recombinat (Shingrix) 02/12/2018, 05/13/2018   Pertinent  Health Maintenance Due  Topic Date Due  . INFLUENZA VACCINE  Completed  . DEXA SCAN  Completed  . PNA vac Low Risk Adult  Completed   Fall Risk  09/23/2020 08/26/2020 07/28/2020 02/25/2020 01/28/2020  Falls in the past year? 1 1 0 0 0  Number falls in past yr: 0 0 1 0 0  Comment - - - - -  Injury with Fall? 1 1 0 0 0  Comment Patient fractured pelvis and had a stroke - - - -  Risk for fall due to : - History of fall(s);Impaired balance/gait;Impaired mobility;Orthopedic patient - - -  Follow up - Falls evaluation completed;Education provided;Falls prevention discussed;Follow up appointment - - -   Functional Status Survey:    Vitals:   01/06/21 1137  BP: (!) 162/90  Pulse: (!) 58  Resp: 20  Temp: (!) 97 F (36.1 C)  SpO2: 98%   Weight: 129 lb 9.6 oz (58.8 kg)  Height: 5\' 6"  (1.676 m)   Body mass index is 20.92 kg/m. Physical Exam Vitals and nursing note reviewed.  Constitutional:      General: She is not in acute distress.    Appearance: She is not diaphoretic.  HENT:     Head: Normocephalic and atraumatic.     Mouth/Throat:     Mouth: Mucous membranes are moist.     Pharynx: Oropharynx is clear.  Neck:     Vascular: No JVD.  Cardiovascular:     Rate and Rhythm: Normal rate. Rhythm irregular.     Heart  sounds: No murmur heard.   Pulmonary:     Effort: Pulmonary effort is normal. No respiratory distress.     Breath sounds: Normal breath sounds. No wheezing.  Abdominal:     General: Abdomen is flat. Bowel sounds are normal.     Palpations: Abdomen is soft.  Musculoskeletal:        General: No swelling, tenderness, deformity or signs of injury.     Right lower leg: No edema.     Left lower leg: No edema.  Skin:    General: Skin is warm and dry.  Neurological:     Mental Status: She is alert and oriented to person, place, and time.     Comments: Left sided weakness      Labs reviewed: Recent Labs    08/17/20 0731 08/18/20 0255 10/12/20 0000  NA 142 137 137  K 3.7 3.9 5.0  CL 105 106 101  CO2 24 23 22   GLUCOSE 112* 134*  --   BUN 24* 21 18  CREATININE 0.89 1.01* 0.7  CALCIUM 9.3 8.7* 9.0   Recent Labs    03/04/20 0000 03/04/20 0955 08/17/20 0731 08/18/20 0255  AST  --  20 32 26  ALT  --  15 24 21   ALKPHOS  --   --  62 53  BILITOT  --   --  1.2 1.2  PROT  --   --  6.5 6.1*  ALBUMIN 3.4*  --  3.7 3.4*   Recent Labs    08/17/20 0731 08/17/20 1322 08/18/20 0255 08/23/20 0521 08/23/20 1621 08/30/20 0000 10/12/20 0000  WBC 15.5* 12.7* 15.9* 16.9*  --  13.9 8.7  NEUTROABS 11.9* 11.1*  --   --   --  10.40  --   HGB 11.2* 9.7* 9.1* 7.3* 8.8* 9.6* 11.5*  HCT 35.0* 31.3* 29.2* 24.1* 27.9* 29* 36  MCV 92.3 93.4 94.5 97.6  --   --   --   PLT 84* 81* 81* 138*  --  197 105*    Lab Results  Component Value Date   TSH 3.80 09/14/2015   Lab Results  Component Value Date   HGBA1C 5.7 (H) 08/19/2020   Lab Results  Component Value Date   CHOL 166 07/22/2020   HDL 53 07/22/2020   LDLCALC 106 03/25/2019   LDLDIRECT 81.3 08/19/2020   TRIG 54 07/22/2020   CHOLHDL 2 09/12/2010    Significant Diagnostic Results in last 30 days:  No results found.  Assessment/Plan  1. Essential hypertension Add HCTZ 25 mg qd BMP 2 weeks She used to be on this med and it was stopped. She said it was because she did not need it anymore but one note indicates there may have been a concern for leg cramps. Will monitor.   2. Atrioventricular block, complete ([place]) S/p pacemaker   3. Permanent atrial fibrillation ([facility]) Rate is controlled Not on anticoagulation due to prior bleeding episodes  Continue baby asa  4. 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 Acute pain of right knee Continue working with therapy Continue ultram and voltaren gel   6. Oropharyngeal dysphagia Improved Currently on a reg diet  Needs tdap  Family/ staff Communication: resident  Labs/tests ordered:  BMP 2 weeks

## 2021-01-07 ENCOUNTER — Encounter: Payer: Self-pay | Admitting: Adult Health

## 2021-01-07 DIAGNOSIS — I6389 Other cerebral infarction: Secondary | ICD-10-CM | POA: Diagnosis not present

## 2021-01-07 DIAGNOSIS — M84350S Stress fracture, pelvis, sequela: Secondary | ICD-10-CM | POA: Diagnosis not present

## 2021-01-07 DIAGNOSIS — M6389 Disorders of muscle in diseases classified elsewhere, multiple sites: Secondary | ICD-10-CM | POA: Diagnosis not present

## 2021-01-07 DIAGNOSIS — R278 Other lack of coordination: Secondary | ICD-10-CM | POA: Diagnosis not present

## 2021-01-07 DIAGNOSIS — Z9181 History of falling: Secondary | ICD-10-CM | POA: Diagnosis not present

## 2021-01-08 DIAGNOSIS — M62562 Muscle wasting and atrophy, not elsewhere classified, left lower leg: Secondary | ICD-10-CM | POA: Diagnosis not present

## 2021-01-08 DIAGNOSIS — I6389 Other cerebral infarction: Secondary | ICD-10-CM | POA: Diagnosis not present

## 2021-01-08 DIAGNOSIS — R2689 Other abnormalities of gait and mobility: Secondary | ICD-10-CM | POA: Diagnosis not present

## 2021-01-08 DIAGNOSIS — R278 Other lack of coordination: Secondary | ICD-10-CM | POA: Diagnosis not present

## 2021-01-08 DIAGNOSIS — Z9181 History of falling: Secondary | ICD-10-CM | POA: Diagnosis not present

## 2021-01-08 DIAGNOSIS — M84350S Stress fracture, pelvis, sequela: Secondary | ICD-10-CM | POA: Diagnosis not present

## 2021-01-10 DIAGNOSIS — Z9181 History of falling: Secondary | ICD-10-CM | POA: Diagnosis not present

## 2021-01-10 DIAGNOSIS — I6389 Other cerebral infarction: Secondary | ICD-10-CM | POA: Diagnosis not present

## 2021-01-10 DIAGNOSIS — M6389 Disorders of muscle in diseases classified elsewhere, multiple sites: Secondary | ICD-10-CM | POA: Diagnosis not present

## 2021-01-10 DIAGNOSIS — R278 Other lack of coordination: Secondary | ICD-10-CM | POA: Diagnosis not present

## 2021-01-10 DIAGNOSIS — M84350S Stress fracture, pelvis, sequela: Secondary | ICD-10-CM | POA: Diagnosis not present

## 2021-01-10 DIAGNOSIS — R197 Diarrhea, unspecified: Secondary | ICD-10-CM | POA: Diagnosis not present

## 2021-01-11 DIAGNOSIS — M84350S Stress fracture, pelvis, sequela: Secondary | ICD-10-CM | POA: Diagnosis not present

## 2021-01-11 DIAGNOSIS — M62562 Muscle wasting and atrophy, not elsewhere classified, left lower leg: Secondary | ICD-10-CM | POA: Diagnosis not present

## 2021-01-11 DIAGNOSIS — Z9181 History of falling: Secondary | ICD-10-CM | POA: Diagnosis not present

## 2021-01-11 DIAGNOSIS — I6389 Other cerebral infarction: Secondary | ICD-10-CM | POA: Diagnosis not present

## 2021-01-11 DIAGNOSIS — R2689 Other abnormalities of gait and mobility: Secondary | ICD-10-CM | POA: Diagnosis not present

## 2021-01-11 DIAGNOSIS — R278 Other lack of coordination: Secondary | ICD-10-CM | POA: Diagnosis not present

## 2021-01-11 DIAGNOSIS — M6389 Disorders of muscle in diseases classified elsewhere, multiple sites: Secondary | ICD-10-CM | POA: Diagnosis not present

## 2021-01-12 DIAGNOSIS — R278 Other lack of coordination: Secondary | ICD-10-CM | POA: Diagnosis not present

## 2021-01-12 DIAGNOSIS — M62562 Muscle wasting and atrophy, not elsewhere classified, left lower leg: Secondary | ICD-10-CM | POA: Diagnosis not present

## 2021-01-12 DIAGNOSIS — M6389 Disorders of muscle in diseases classified elsewhere, multiple sites: Secondary | ICD-10-CM | POA: Diagnosis not present

## 2021-01-12 DIAGNOSIS — Z9181 History of falling: Secondary | ICD-10-CM | POA: Diagnosis not present

## 2021-01-12 DIAGNOSIS — I6389 Other cerebral infarction: Secondary | ICD-10-CM | POA: Diagnosis not present

## 2021-01-12 DIAGNOSIS — M84350S Stress fracture, pelvis, sequela: Secondary | ICD-10-CM | POA: Diagnosis not present

## 2021-01-12 DIAGNOSIS — R2689 Other abnormalities of gait and mobility: Secondary | ICD-10-CM | POA: Diagnosis not present

## 2021-01-13 DIAGNOSIS — R278 Other lack of coordination: Secondary | ICD-10-CM | POA: Diagnosis not present

## 2021-01-13 DIAGNOSIS — Z9181 History of falling: Secondary | ICD-10-CM | POA: Diagnosis not present

## 2021-01-13 DIAGNOSIS — M6389 Disorders of muscle in diseases classified elsewhere, multiple sites: Secondary | ICD-10-CM | POA: Diagnosis not present

## 2021-01-13 DIAGNOSIS — M84350S Stress fracture, pelvis, sequela: Secondary | ICD-10-CM | POA: Diagnosis not present

## 2021-01-13 DIAGNOSIS — I6389 Other cerebral infarction: Secondary | ICD-10-CM | POA: Diagnosis not present

## 2021-01-14 DIAGNOSIS — M84350S Stress fracture, pelvis, sequela: Secondary | ICD-10-CM | POA: Diagnosis not present

## 2021-01-14 DIAGNOSIS — Z9181 History of falling: Secondary | ICD-10-CM | POA: Diagnosis not present

## 2021-01-14 DIAGNOSIS — R278 Other lack of coordination: Secondary | ICD-10-CM | POA: Diagnosis not present

## 2021-01-14 DIAGNOSIS — M62562 Muscle wasting and atrophy, not elsewhere classified, left lower leg: Secondary | ICD-10-CM | POA: Diagnosis not present

## 2021-01-14 DIAGNOSIS — M6389 Disorders of muscle in diseases classified elsewhere, multiple sites: Secondary | ICD-10-CM | POA: Diagnosis not present

## 2021-01-14 DIAGNOSIS — I6389 Other cerebral infarction: Secondary | ICD-10-CM | POA: Diagnosis not present

## 2021-01-14 DIAGNOSIS — R2689 Other abnormalities of gait and mobility: Secondary | ICD-10-CM | POA: Diagnosis not present

## 2021-01-16 DIAGNOSIS — R278 Other lack of coordination: Secondary | ICD-10-CM | POA: Diagnosis not present

## 2021-01-16 DIAGNOSIS — Z9181 History of falling: Secondary | ICD-10-CM | POA: Diagnosis not present

## 2021-01-16 DIAGNOSIS — I6389 Other cerebral infarction: Secondary | ICD-10-CM | POA: Diagnosis not present

## 2021-01-16 DIAGNOSIS — M84350S Stress fracture, pelvis, sequela: Secondary | ICD-10-CM | POA: Diagnosis not present

## 2021-01-16 DIAGNOSIS — R2689 Other abnormalities of gait and mobility: Secondary | ICD-10-CM | POA: Diagnosis not present

## 2021-01-16 DIAGNOSIS — M62562 Muscle wasting and atrophy, not elsewhere classified, left lower leg: Secondary | ICD-10-CM | POA: Diagnosis not present

## 2021-01-17 DIAGNOSIS — I6389 Other cerebral infarction: Secondary | ICD-10-CM | POA: Diagnosis not present

## 2021-01-17 DIAGNOSIS — Z9181 History of falling: Secondary | ICD-10-CM | POA: Diagnosis not present

## 2021-01-17 DIAGNOSIS — R2689 Other abnormalities of gait and mobility: Secondary | ICD-10-CM | POA: Diagnosis not present

## 2021-01-17 DIAGNOSIS — M62562 Muscle wasting and atrophy, not elsewhere classified, left lower leg: Secondary | ICD-10-CM | POA: Diagnosis not present

## 2021-01-17 DIAGNOSIS — M6389 Disorders of muscle in diseases classified elsewhere, multiple sites: Secondary | ICD-10-CM | POA: Diagnosis not present

## 2021-01-17 DIAGNOSIS — R278 Other lack of coordination: Secondary | ICD-10-CM | POA: Diagnosis not present

## 2021-01-17 DIAGNOSIS — M84350S Stress fracture, pelvis, sequela: Secondary | ICD-10-CM | POA: Diagnosis not present

## 2021-01-18 DIAGNOSIS — I6389 Other cerebral infarction: Secondary | ICD-10-CM | POA: Diagnosis not present

## 2021-01-18 DIAGNOSIS — M6389 Disorders of muscle in diseases classified elsewhere, multiple sites: Secondary | ICD-10-CM | POA: Diagnosis not present

## 2021-01-18 DIAGNOSIS — R2689 Other abnormalities of gait and mobility: Secondary | ICD-10-CM | POA: Diagnosis not present

## 2021-01-18 DIAGNOSIS — R278 Other lack of coordination: Secondary | ICD-10-CM | POA: Diagnosis not present

## 2021-01-18 DIAGNOSIS — Z9181 History of falling: Secondary | ICD-10-CM | POA: Diagnosis not present

## 2021-01-18 DIAGNOSIS — M62562 Muscle wasting and atrophy, not elsewhere classified, left lower leg: Secondary | ICD-10-CM | POA: Diagnosis not present

## 2021-01-18 DIAGNOSIS — M84350S Stress fracture, pelvis, sequela: Secondary | ICD-10-CM | POA: Diagnosis not present

## 2021-01-19 DIAGNOSIS — M62562 Muscle wasting and atrophy, not elsewhere classified, left lower leg: Secondary | ICD-10-CM | POA: Diagnosis not present

## 2021-01-19 DIAGNOSIS — M84350S Stress fracture, pelvis, sequela: Secondary | ICD-10-CM | POA: Diagnosis not present

## 2021-01-19 DIAGNOSIS — I6389 Other cerebral infarction: Secondary | ICD-10-CM | POA: Diagnosis not present

## 2021-01-19 DIAGNOSIS — Z9181 History of falling: Secondary | ICD-10-CM | POA: Diagnosis not present

## 2021-01-19 DIAGNOSIS — R2689 Other abnormalities of gait and mobility: Secondary | ICD-10-CM | POA: Diagnosis not present

## 2021-01-19 DIAGNOSIS — Z79899 Other long term (current) drug therapy: Secondary | ICD-10-CM | POA: Diagnosis not present

## 2021-01-19 DIAGNOSIS — M6389 Disorders of muscle in diseases classified elsewhere, multiple sites: Secondary | ICD-10-CM | POA: Diagnosis not present

## 2021-01-19 DIAGNOSIS — R278 Other lack of coordination: Secondary | ICD-10-CM | POA: Diagnosis not present

## 2021-01-19 LAB — BASIC METABOLIC PANEL
BUN: 30 — AB (ref 4–21)
CO2: 28 — AB (ref 13–22)
Chloride: 97 — AB (ref 99–108)
Creatinine: 1 (ref 0.5–1.1)
Glucose: 116
Potassium: 4.3 (ref 3.4–5.3)
Sodium: 140 (ref 137–147)

## 2021-01-19 LAB — COMPREHENSIVE METABOLIC PANEL: Calcium: 10.2 (ref 8.7–10.7)

## 2021-01-20 DIAGNOSIS — M84350S Stress fracture, pelvis, sequela: Secondary | ICD-10-CM | POA: Diagnosis not present

## 2021-01-20 DIAGNOSIS — I6389 Other cerebral infarction: Secondary | ICD-10-CM | POA: Diagnosis not present

## 2021-01-20 DIAGNOSIS — R278 Other lack of coordination: Secondary | ICD-10-CM | POA: Diagnosis not present

## 2021-01-20 DIAGNOSIS — M6389 Disorders of muscle in diseases classified elsewhere, multiple sites: Secondary | ICD-10-CM | POA: Diagnosis not present

## 2021-01-20 DIAGNOSIS — Z9181 History of falling: Secondary | ICD-10-CM | POA: Diagnosis not present

## 2021-01-21 DIAGNOSIS — I6389 Other cerebral infarction: Secondary | ICD-10-CM | POA: Diagnosis not present

## 2021-01-21 DIAGNOSIS — M6389 Disorders of muscle in diseases classified elsewhere, multiple sites: Secondary | ICD-10-CM | POA: Diagnosis not present

## 2021-01-21 DIAGNOSIS — Z9181 History of falling: Secondary | ICD-10-CM | POA: Diagnosis not present

## 2021-01-21 DIAGNOSIS — M84350S Stress fracture, pelvis, sequela: Secondary | ICD-10-CM | POA: Diagnosis not present

## 2021-01-21 DIAGNOSIS — R278 Other lack of coordination: Secondary | ICD-10-CM | POA: Diagnosis not present

## 2021-01-22 DIAGNOSIS — R2689 Other abnormalities of gait and mobility: Secondary | ICD-10-CM | POA: Diagnosis not present

## 2021-01-22 DIAGNOSIS — R278 Other lack of coordination: Secondary | ICD-10-CM | POA: Diagnosis not present

## 2021-01-22 DIAGNOSIS — Z9181 History of falling: Secondary | ICD-10-CM | POA: Diagnosis not present

## 2021-01-22 DIAGNOSIS — I6389 Other cerebral infarction: Secondary | ICD-10-CM | POA: Diagnosis not present

## 2021-01-22 DIAGNOSIS — M84350S Stress fracture, pelvis, sequela: Secondary | ICD-10-CM | POA: Diagnosis not present

## 2021-01-22 DIAGNOSIS — M62562 Muscle wasting and atrophy, not elsewhere classified, left lower leg: Secondary | ICD-10-CM | POA: Diagnosis not present

## 2021-01-23 DIAGNOSIS — I6389 Other cerebral infarction: Secondary | ICD-10-CM | POA: Diagnosis not present

## 2021-01-23 DIAGNOSIS — M62562 Muscle wasting and atrophy, not elsewhere classified, left lower leg: Secondary | ICD-10-CM | POA: Diagnosis not present

## 2021-01-23 DIAGNOSIS — M84350S Stress fracture, pelvis, sequela: Secondary | ICD-10-CM | POA: Diagnosis not present

## 2021-01-23 DIAGNOSIS — R278 Other lack of coordination: Secondary | ICD-10-CM | POA: Diagnosis not present

## 2021-01-23 DIAGNOSIS — R2689 Other abnormalities of gait and mobility: Secondary | ICD-10-CM | POA: Diagnosis not present

## 2021-01-23 DIAGNOSIS — Z9181 History of falling: Secondary | ICD-10-CM | POA: Diagnosis not present

## 2021-01-24 DIAGNOSIS — R2689 Other abnormalities of gait and mobility: Secondary | ICD-10-CM | POA: Diagnosis not present

## 2021-01-24 DIAGNOSIS — I6389 Other cerebral infarction: Secondary | ICD-10-CM | POA: Diagnosis not present

## 2021-01-24 DIAGNOSIS — M62562 Muscle wasting and atrophy, not elsewhere classified, left lower leg: Secondary | ICD-10-CM | POA: Diagnosis not present

## 2021-01-24 DIAGNOSIS — R278 Other lack of coordination: Secondary | ICD-10-CM | POA: Diagnosis not present

## 2021-01-24 DIAGNOSIS — M6389 Disorders of muscle in diseases classified elsewhere, multiple sites: Secondary | ICD-10-CM | POA: Diagnosis not present

## 2021-01-24 DIAGNOSIS — Z9181 History of falling: Secondary | ICD-10-CM | POA: Diagnosis not present

## 2021-01-24 DIAGNOSIS — M84350S Stress fracture, pelvis, sequela: Secondary | ICD-10-CM | POA: Diagnosis not present

## 2021-01-25 DIAGNOSIS — R2689 Other abnormalities of gait and mobility: Secondary | ICD-10-CM | POA: Diagnosis not present

## 2021-01-25 DIAGNOSIS — Z9181 History of falling: Secondary | ICD-10-CM | POA: Diagnosis not present

## 2021-01-25 DIAGNOSIS — M84350S Stress fracture, pelvis, sequela: Secondary | ICD-10-CM | POA: Diagnosis not present

## 2021-01-25 DIAGNOSIS — M6389 Disorders of muscle in diseases classified elsewhere, multiple sites: Secondary | ICD-10-CM | POA: Diagnosis not present

## 2021-01-25 DIAGNOSIS — I6389 Other cerebral infarction: Secondary | ICD-10-CM | POA: Diagnosis not present

## 2021-01-25 DIAGNOSIS — R278 Other lack of coordination: Secondary | ICD-10-CM | POA: Diagnosis not present

## 2021-01-25 DIAGNOSIS — M62562 Muscle wasting and atrophy, not elsewhere classified, left lower leg: Secondary | ICD-10-CM | POA: Diagnosis not present

## 2021-01-26 ENCOUNTER — Encounter: Payer: Self-pay | Admitting: Internal Medicine

## 2021-01-26 DIAGNOSIS — R278 Other lack of coordination: Secondary | ICD-10-CM | POA: Diagnosis not present

## 2021-01-26 DIAGNOSIS — M62562 Muscle wasting and atrophy, not elsewhere classified, left lower leg: Secondary | ICD-10-CM | POA: Diagnosis not present

## 2021-01-26 DIAGNOSIS — M84350S Stress fracture, pelvis, sequela: Secondary | ICD-10-CM | POA: Diagnosis not present

## 2021-01-26 DIAGNOSIS — Z9181 History of falling: Secondary | ICD-10-CM | POA: Diagnosis not present

## 2021-01-26 DIAGNOSIS — R2689 Other abnormalities of gait and mobility: Secondary | ICD-10-CM | POA: Diagnosis not present

## 2021-01-26 DIAGNOSIS — M6389 Disorders of muscle in diseases classified elsewhere, multiple sites: Secondary | ICD-10-CM | POA: Diagnosis not present

## 2021-01-26 DIAGNOSIS — I6389 Other cerebral infarction: Secondary | ICD-10-CM | POA: Diagnosis not present

## 2021-01-27 DIAGNOSIS — I6389 Other cerebral infarction: Secondary | ICD-10-CM | POA: Diagnosis not present

## 2021-01-27 DIAGNOSIS — Z9181 History of falling: Secondary | ICD-10-CM | POA: Diagnosis not present

## 2021-01-27 DIAGNOSIS — R278 Other lack of coordination: Secondary | ICD-10-CM | POA: Diagnosis not present

## 2021-01-27 DIAGNOSIS — M6389 Disorders of muscle in diseases classified elsewhere, multiple sites: Secondary | ICD-10-CM | POA: Diagnosis not present

## 2021-01-27 DIAGNOSIS — M84350S Stress fracture, pelvis, sequela: Secondary | ICD-10-CM | POA: Diagnosis not present

## 2021-01-28 DIAGNOSIS — R2689 Other abnormalities of gait and mobility: Secondary | ICD-10-CM | POA: Diagnosis not present

## 2021-01-28 DIAGNOSIS — I6389 Other cerebral infarction: Secondary | ICD-10-CM | POA: Diagnosis not present

## 2021-01-28 DIAGNOSIS — M84350S Stress fracture, pelvis, sequela: Secondary | ICD-10-CM | POA: Diagnosis not present

## 2021-01-28 DIAGNOSIS — R278 Other lack of coordination: Secondary | ICD-10-CM | POA: Diagnosis not present

## 2021-01-28 DIAGNOSIS — Z9181 History of falling: Secondary | ICD-10-CM | POA: Diagnosis not present

## 2021-01-28 DIAGNOSIS — M62562 Muscle wasting and atrophy, not elsewhere classified, left lower leg: Secondary | ICD-10-CM | POA: Diagnosis not present

## 2021-01-28 DIAGNOSIS — M6389 Disorders of muscle in diseases classified elsewhere, multiple sites: Secondary | ICD-10-CM | POA: Diagnosis not present

## 2021-01-31 ENCOUNTER — Encounter: Payer: Self-pay | Admitting: *Deleted

## 2021-01-31 DIAGNOSIS — Z9181 History of falling: Secondary | ICD-10-CM | POA: Diagnosis not present

## 2021-01-31 DIAGNOSIS — M84350S Stress fracture, pelvis, sequela: Secondary | ICD-10-CM | POA: Diagnosis not present

## 2021-01-31 DIAGNOSIS — I6389 Other cerebral infarction: Secondary | ICD-10-CM | POA: Diagnosis not present

## 2021-01-31 DIAGNOSIS — R2689 Other abnormalities of gait and mobility: Secondary | ICD-10-CM | POA: Diagnosis not present

## 2021-01-31 DIAGNOSIS — M6389 Disorders of muscle in diseases classified elsewhere, multiple sites: Secondary | ICD-10-CM | POA: Diagnosis not present

## 2021-01-31 DIAGNOSIS — Z961 Presence of intraocular lens: Secondary | ICD-10-CM | POA: Diagnosis not present

## 2021-01-31 DIAGNOSIS — M62562 Muscle wasting and atrophy, not elsewhere classified, left lower leg: Secondary | ICD-10-CM | POA: Diagnosis not present

## 2021-01-31 DIAGNOSIS — H52203 Unspecified astigmatism, bilateral: Secondary | ICD-10-CM | POA: Diagnosis not present

## 2021-01-31 DIAGNOSIS — R278 Other lack of coordination: Secondary | ICD-10-CM | POA: Diagnosis not present

## 2021-02-01 DIAGNOSIS — Z9181 History of falling: Secondary | ICD-10-CM | POA: Diagnosis not present

## 2021-02-01 DIAGNOSIS — M84350S Stress fracture, pelvis, sequela: Secondary | ICD-10-CM | POA: Diagnosis not present

## 2021-02-01 DIAGNOSIS — R2689 Other abnormalities of gait and mobility: Secondary | ICD-10-CM | POA: Diagnosis not present

## 2021-02-01 DIAGNOSIS — I6389 Other cerebral infarction: Secondary | ICD-10-CM | POA: Diagnosis not present

## 2021-02-01 DIAGNOSIS — M62562 Muscle wasting and atrophy, not elsewhere classified, left lower leg: Secondary | ICD-10-CM | POA: Diagnosis not present

## 2021-02-01 DIAGNOSIS — R278 Other lack of coordination: Secondary | ICD-10-CM | POA: Diagnosis not present

## 2021-02-01 DIAGNOSIS — M6389 Disorders of muscle in diseases classified elsewhere, multiple sites: Secondary | ICD-10-CM | POA: Diagnosis not present

## 2021-02-02 DIAGNOSIS — Z9181 History of falling: Secondary | ICD-10-CM | POA: Diagnosis not present

## 2021-02-02 DIAGNOSIS — R2689 Other abnormalities of gait and mobility: Secondary | ICD-10-CM | POA: Diagnosis not present

## 2021-02-02 DIAGNOSIS — M62562 Muscle wasting and atrophy, not elsewhere classified, left lower leg: Secondary | ICD-10-CM | POA: Diagnosis not present

## 2021-02-02 DIAGNOSIS — M84350S Stress fracture, pelvis, sequela: Secondary | ICD-10-CM | POA: Diagnosis not present

## 2021-02-02 DIAGNOSIS — M6389 Disorders of muscle in diseases classified elsewhere, multiple sites: Secondary | ICD-10-CM | POA: Diagnosis not present

## 2021-02-02 DIAGNOSIS — R278 Other lack of coordination: Secondary | ICD-10-CM | POA: Diagnosis not present

## 2021-02-02 DIAGNOSIS — I6389 Other cerebral infarction: Secondary | ICD-10-CM | POA: Diagnosis not present

## 2021-02-03 DIAGNOSIS — M62562 Muscle wasting and atrophy, not elsewhere classified, left lower leg: Secondary | ICD-10-CM | POA: Diagnosis not present

## 2021-02-03 DIAGNOSIS — Z9181 History of falling: Secondary | ICD-10-CM | POA: Diagnosis not present

## 2021-02-03 DIAGNOSIS — I6389 Other cerebral infarction: Secondary | ICD-10-CM | POA: Diagnosis not present

## 2021-02-03 DIAGNOSIS — R2689 Other abnormalities of gait and mobility: Secondary | ICD-10-CM | POA: Diagnosis not present

## 2021-02-03 DIAGNOSIS — M84350S Stress fracture, pelvis, sequela: Secondary | ICD-10-CM | POA: Diagnosis not present

## 2021-02-03 DIAGNOSIS — R278 Other lack of coordination: Secondary | ICD-10-CM | POA: Diagnosis not present

## 2021-02-03 DIAGNOSIS — M6389 Disorders of muscle in diseases classified elsewhere, multiple sites: Secondary | ICD-10-CM | POA: Diagnosis not present

## 2021-02-04 DIAGNOSIS — Z9181 History of falling: Secondary | ICD-10-CM | POA: Diagnosis not present

## 2021-02-04 DIAGNOSIS — I6389 Other cerebral infarction: Secondary | ICD-10-CM | POA: Diagnosis not present

## 2021-02-04 DIAGNOSIS — M6389 Disorders of muscle in diseases classified elsewhere, multiple sites: Secondary | ICD-10-CM | POA: Diagnosis not present

## 2021-02-04 DIAGNOSIS — M84350S Stress fracture, pelvis, sequela: Secondary | ICD-10-CM | POA: Diagnosis not present

## 2021-02-04 DIAGNOSIS — R278 Other lack of coordination: Secondary | ICD-10-CM | POA: Diagnosis not present

## 2021-02-05 DIAGNOSIS — R278 Other lack of coordination: Secondary | ICD-10-CM | POA: Diagnosis not present

## 2021-02-05 DIAGNOSIS — M62562 Muscle wasting and atrophy, not elsewhere classified, left lower leg: Secondary | ICD-10-CM | POA: Diagnosis not present

## 2021-02-05 DIAGNOSIS — I6389 Other cerebral infarction: Secondary | ICD-10-CM | POA: Diagnosis not present

## 2021-02-05 DIAGNOSIS — R2689 Other abnormalities of gait and mobility: Secondary | ICD-10-CM | POA: Diagnosis not present

## 2021-02-05 DIAGNOSIS — Z9181 History of falling: Secondary | ICD-10-CM | POA: Diagnosis not present

## 2021-02-05 DIAGNOSIS — M84350S Stress fracture, pelvis, sequela: Secondary | ICD-10-CM | POA: Diagnosis not present

## 2021-02-06 DIAGNOSIS — M84350S Stress fracture, pelvis, sequela: Secondary | ICD-10-CM | POA: Diagnosis not present

## 2021-02-06 DIAGNOSIS — M62562 Muscle wasting and atrophy, not elsewhere classified, left lower leg: Secondary | ICD-10-CM | POA: Diagnosis not present

## 2021-02-06 DIAGNOSIS — R2689 Other abnormalities of gait and mobility: Secondary | ICD-10-CM | POA: Diagnosis not present

## 2021-02-06 DIAGNOSIS — I6389 Other cerebral infarction: Secondary | ICD-10-CM | POA: Diagnosis not present

## 2021-02-06 DIAGNOSIS — Z9181 History of falling: Secondary | ICD-10-CM | POA: Diagnosis not present

## 2021-02-06 DIAGNOSIS — R278 Other lack of coordination: Secondary | ICD-10-CM | POA: Diagnosis not present

## 2021-02-07 DIAGNOSIS — M6389 Disorders of muscle in diseases classified elsewhere, multiple sites: Secondary | ICD-10-CM | POA: Diagnosis not present

## 2021-02-07 DIAGNOSIS — I6389 Other cerebral infarction: Secondary | ICD-10-CM | POA: Diagnosis not present

## 2021-02-07 DIAGNOSIS — Z9181 History of falling: Secondary | ICD-10-CM | POA: Diagnosis not present

## 2021-02-07 DIAGNOSIS — M84350S Stress fracture, pelvis, sequela: Secondary | ICD-10-CM | POA: Diagnosis not present

## 2021-02-07 DIAGNOSIS — M62562 Muscle wasting and atrophy, not elsewhere classified, left lower leg: Secondary | ICD-10-CM | POA: Diagnosis not present

## 2021-02-07 DIAGNOSIS — R2689 Other abnormalities of gait and mobility: Secondary | ICD-10-CM | POA: Diagnosis not present

## 2021-02-07 DIAGNOSIS — R278 Other lack of coordination: Secondary | ICD-10-CM | POA: Diagnosis not present

## 2021-02-08 DIAGNOSIS — R278 Other lack of coordination: Secondary | ICD-10-CM | POA: Diagnosis not present

## 2021-02-08 DIAGNOSIS — M62562 Muscle wasting and atrophy, not elsewhere classified, left lower leg: Secondary | ICD-10-CM | POA: Diagnosis not present

## 2021-02-08 DIAGNOSIS — R2689 Other abnormalities of gait and mobility: Secondary | ICD-10-CM | POA: Diagnosis not present

## 2021-02-08 DIAGNOSIS — Z9181 History of falling: Secondary | ICD-10-CM | POA: Diagnosis not present

## 2021-02-08 DIAGNOSIS — M84350S Stress fracture, pelvis, sequela: Secondary | ICD-10-CM | POA: Diagnosis not present

## 2021-02-08 DIAGNOSIS — M6389 Disorders of muscle in diseases classified elsewhere, multiple sites: Secondary | ICD-10-CM | POA: Diagnosis not present

## 2021-02-08 DIAGNOSIS — I6389 Other cerebral infarction: Secondary | ICD-10-CM | POA: Diagnosis not present

## 2021-02-09 DIAGNOSIS — M6389 Disorders of muscle in diseases classified elsewhere, multiple sites: Secondary | ICD-10-CM | POA: Diagnosis not present

## 2021-02-09 DIAGNOSIS — Z9181 History of falling: Secondary | ICD-10-CM | POA: Diagnosis not present

## 2021-02-09 DIAGNOSIS — R2689 Other abnormalities of gait and mobility: Secondary | ICD-10-CM | POA: Diagnosis not present

## 2021-02-09 DIAGNOSIS — M84350S Stress fracture, pelvis, sequela: Secondary | ICD-10-CM | POA: Diagnosis not present

## 2021-02-09 DIAGNOSIS — I6389 Other cerebral infarction: Secondary | ICD-10-CM | POA: Diagnosis not present

## 2021-02-09 DIAGNOSIS — M62562 Muscle wasting and atrophy, not elsewhere classified, left lower leg: Secondary | ICD-10-CM | POA: Diagnosis not present

## 2021-02-09 DIAGNOSIS — R278 Other lack of coordination: Secondary | ICD-10-CM | POA: Diagnosis not present

## 2021-02-10 DIAGNOSIS — I6389 Other cerebral infarction: Secondary | ICD-10-CM | POA: Diagnosis not present

## 2021-02-10 DIAGNOSIS — M6389 Disorders of muscle in diseases classified elsewhere, multiple sites: Secondary | ICD-10-CM | POA: Diagnosis not present

## 2021-02-10 DIAGNOSIS — Z9181 History of falling: Secondary | ICD-10-CM | POA: Diagnosis not present

## 2021-02-10 DIAGNOSIS — M84350S Stress fracture, pelvis, sequela: Secondary | ICD-10-CM | POA: Diagnosis not present

## 2021-02-10 DIAGNOSIS — R278 Other lack of coordination: Secondary | ICD-10-CM | POA: Diagnosis not present

## 2021-02-10 DIAGNOSIS — R2689 Other abnormalities of gait and mobility: Secondary | ICD-10-CM | POA: Diagnosis not present

## 2021-02-10 DIAGNOSIS — M62562 Muscle wasting and atrophy, not elsewhere classified, left lower leg: Secondary | ICD-10-CM | POA: Diagnosis not present

## 2021-02-11 ENCOUNTER — Encounter: Payer: Self-pay | Admitting: Internal Medicine

## 2021-02-11 ENCOUNTER — Non-Acute Institutional Stay (SKILLED_NURSING_FACILITY): Payer: Medicare Other | Admitting: Internal Medicine

## 2021-02-11 DIAGNOSIS — I442 Atrioventricular block, complete: Secondary | ICD-10-CM

## 2021-02-11 DIAGNOSIS — I69354 Hemiplegia and hemiparesis following cerebral infarction affecting left non-dominant side: Secondary | ICD-10-CM | POA: Diagnosis not present

## 2021-02-11 DIAGNOSIS — I4821 Permanent atrial fibrillation: Secondary | ICD-10-CM | POA: Diagnosis not present

## 2021-02-11 DIAGNOSIS — R1312 Dysphagia, oropharyngeal phase: Secondary | ICD-10-CM | POA: Diagnosis not present

## 2021-02-11 DIAGNOSIS — I10 Essential (primary) hypertension: Secondary | ICD-10-CM | POA: Diagnosis not present

## 2021-02-11 NOTE — Progress Notes (Addendum)
Location:    [facility] Room Number: 118 Place of Service:  SNF [phone]) Provider:  Veleta Miners MD  [name], MD  Patient Care Team: [name], MD as PCP - General (Internal Medicine) [name], MD as PCP - Electrophysiology (Cardiology) [name], MD as Consulting Physician (Dermatology) [name], MD as Consulting Physician (Cardiology) [facility] [name], MD as Consulting Physician (Dermatology)  Extended Emergency Contact Information Primary Emergency Contact: [name] of [place] Phone: [phone] Mobile Phone: [phone] Relation: Daughter Secondary Emergency Contact: [name],[name]  [name] of [place] Phone: [phone] Relation: Son  Code Status:  DNR Goals of care: Advanced Directive information Advanced Directives 02/11/2021  Does Patient Have a Medical Advance Directive? Yes  Type of Paramedic of [place];Living will;Out of facility DNR (pink MOST or yellow form)  Does patient want to make changes to medical advance directive? No - Patient declined  Copy of [place] in Chart? Yes - validated most recent copy scanned in chart (See row information)  Pre-existing out of facility DNR order (yellow form or pink MOST form) -     Chief Complaint  Patient presents with  . Medical Management of Chronic Issues  . Health Maintenance    TDAP    HPI:  Pt is a 87 y.o. female seen today for medical management of chronic diseases.    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  With extraperitoneal  hemorrhage in 10/21 Patient was  off Eliquis before her stroke due to ruptured Baker's cyst with hemorrhage before her embolic stroke  Patient continues to make slow progress.  Her cognition is good and her she is very optimistic.  She said that she is was able to walk with assist today. Her daughter has moved most of her stuff now in SNF as she would plan to stay here for now. Her daughter was in the room.  She wanted to know if she would be a candidate to be restarted on Eliquis as she is going to stay here and they want to avoid another stroke.  They do understand that she continues to stay fall risk.  But the risk is  lower since now she plans to stay here in rehab.  Her weight is stable .  Mood is good.  She had no complaints. Past Medical History:  Diagnosis Date  . Actinic keratosis 05/25/2014  . Anxiety   . Atrial fibrillation ([place]) 09/21/2010  . Basal cell carcinoma 05/25/2014   Multiple removed by Dr. Danella Sensing in March 2015: right neck, left neck, scalp   . Bell's palsy 07/18/1979  . Cervicalgia 01/31/2012  . Closed fracture of lumbar vertebra without mention of spinal cord injury 07/17/2005  . Conjunctiva disorder 12/26/2010  . Coronary atherosclerosis of native coronary artery 07/17/1997  . Cramp of limb 08/16/2009  . Degeneration of lumbar or lumbosacral intervertebral disc 01/31/2012  . Disturbance of skin sensation 08/2009  . Dizziness and giddiness 11/08/2011   vertigo  . External hemorrhoids without mention of complication 9/37/1696  . External hemorrhoids without mention of complication 78/93/8101  . Ganglion of tendon sheath 08/16/2009  .  Leg cramp 10/27/2013   Most frequently the right leg.   . Long term (current) use of anticoagulants 09/2010  . Lumbago 07/2009  . Meralgia paresthetica 07/2007  . MI, old   . Myalgia and myositis, unspecified 01/17/2012  . Osteoporosis   . Other abnormal blood chemistry 04/08/2012  . Other abnormal blood chemistry 2013   hyperglycemia   . Other disorder of muscle, ligament, and fascia 04/08/2012  . Other specified cardiac dysrhythmias(427.89) 06/27/2010  . Pacemaker 05/06/2020  . Pain in joint, ankle and foot 10/27/2013   Bilateral since 1998   . Pain in joint, shoulder region 08/12/2012  . Pain in joint, upper arm 12/26/2010  . Pain in limb 01/11/2011  . Pain in thoracic spine 01/31/2012  . 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 PVC's (premature ventricular contractions)   . Rash and other nonspecific skin eruption 08/02/2011  . Senile osteoporosis 07/17/1993  . Stroke ([place])   . Unspecified essential hypertension 07/17/1997  . Varicose veins of lower extremities 08/16/2009  . Varicose veins of lower extremities 08/16/2009   Past Surgical History:  Procedure Laterality Date  . COLONOSCOPY WITH PROPOFOL N/A 10/19/2015   Procedure: COLONOSCOPY WITH PROPOFOL;  Surgeon: Gatha Mayer, MD;  Location: [facility];  Service: Endoscopy;  Laterality: N/A;  . CORONARY ARTERY BYPASS GRAFT  1998   x2; LIMA to LAD; SVG to diagonal off bypass  . HEMORRHOID SURGERY  08/26/2012   Dr. Brantley Stage  . LOOP RECORDER INSERTION N/A 05/13/2018   Procedure: LOOP RECORDER INSERTION;  Surgeon: [name], MD;  Location: [facility];  Service: Cardiovascular;  Laterality: N/A;  . LOOP RECORDER REMOVAL N/A 05/26/2019   Procedure: LOOP RECORDER REMOVAL;  Surgeon: [name], MD;  Location: [facility];  Service: Cardiovascular;  Laterality: N/A;  . MASS EXCISION Left 11/23/2015   Procedure: LEFT WRIST EXCISION CYST;  Surgeon: Leanora Cover, MD;  Location: [facility];  Service: Orthopedics;  Laterality: Left;  . PACEMAKER IMPLANT N/A 05/26/2019   Procedure: PACEMAKER IMPLANT;  Surgeon: [name], MD;  Location: [facility];  Service: Cardiovascular;  Laterality: N/A;  . SKIN BIOPSY  01/29/14   (R) neck; (R) scalp, 2 (L) neck; shave biopsy Superficial basal cell carcinoma Dr. Danella Sensing  .  TONSILLECTOMY  1940's    Allergies  Allergen Reactions  . Iodinated Diagnostic Agents Nausea Only and Other (See Comments)    Severe nausea and also passed out  . Iodine   . Other     Seasonal Allergies.  . Bactrim Rash  . Relafen [Nabumetone] Rash  . Sulfanilamide Rash    Allergies as of 02/11/2021      Reactions   Iodinated Diagnostic Agents Nausea Only, Other (See Comments)   Severe nausea and also passed out   Iodine    Other    Seasonal Allergies.   Bactrim Rash   Relafen [nabumetone] Rash   Sulfanilamide Rash      Medication List       Accurate as of February 11, 2021 12:32 PM. If you have any questions, ask your nurse or doctor.        acetaminophen 500 MG tablet Commonly known as: TYLENOL Take 1,000 mg by mouth every 6 (six) hours as needed for mild pain (pain.).   aspirin 81 MG EC tablet Take 1 tablet (81 mg total) by mouth daily. Swallow whole.   atenolol 50 MG tablet Commonly known as: TENORMIN Take  50 mg by mouth 2 (two) times daily. What changed: Another medication with the same name was removed. Continue taking this medication, and follow the directions you see here. Changed by: [name], MD   calcium-vitamin D 500-200 MG-UNIT tablet Commonly known as: OSCAL WITH D Take 1 tablet by mouth daily.   diclofenac Sodium 1 % Gel Commonly known as: VOLTAREN Apply 4 g topically 4 (four) times daily as needed. Left knee   digoxin 0.125 MG tablet Commonly known as: LANOXIN Take 0.125 mg by mouth daily.   hydrALAZINE 10 MG tablet Commonly known as: APRESOLINE Take 10 mg by mouth 3 (three) times daily. TID PRN for SBP >170   hydrochlorothiazide 25 MG tablet Commonly known as: HYDRODIURIL Take 25 mg by mouth daily.   LORazepam 1 MG tablet Commonly known as: ATIVAN Take 1 tablet (1 mg total) by mouth at bedtime.   ondansetron 4 MG tablet Commonly known as: ZOFRAN Take 1 tablet (4 mg total) by mouth every 6 (six) hours as needed for nausea.    polyethylene glycol 17 g packet Commonly known as: MIRALAX / GLYCOLAX Take 17 g by mouth every other day.   traMADol 50 MG tablet Commonly known as: ULTRAM Take 1 tablet (50 mg total) by mouth every 6 (six) hours as needed for moderate pain or severe pain.       Review of Systems  Constitutional: Negative.   HENT: Negative.   Respiratory: Negative.   Cardiovascular: Negative.   Gastrointestinal: Negative.   Genitourinary: Negative.   Musculoskeletal: Positive for gait problem.  Skin: Negative.   Neurological: Positive for weakness.  Psychiatric/Behavioral: Negative.     Immunization History  Administered Date(s) Administered  . Influenza Whole 08/06/2012  . Influenza, High Dose Seasonal PF 08/15/2019, 08/27/2020  . Influenza,inj,Quad PF,6+ Mos 08/30/2018  . Influenza-Unspecified 08/06/2013, 08/24/2014, 08/26/2015, 08/31/2016, 08/27/2017, 08/27/2020  . Moderna Sars-Covid-2 Vaccination 11/16/2019, 12/16/2019, 09/16/2020  . Pneumococcal Conjugate-13 09/22/2015  . Pneumococcal Polysaccharide-23 08/29/2006  . Td 10/07/2007  . Zoster 03/12/2008  . Zoster Recombinat (Shingrix) 02/12/2018, 05/13/2018   Pertinent  Health Maintenance Due  Topic Date Due  . INFLUENZA VACCINE  06/06/2021  . DEXA SCAN  Completed  . PNA vac Low Risk Adult  Completed   Fall Risk  09/23/2020 08/26/2020 07/28/2020 02/25/2020 01/28/2020  Falls in the past year? 1 1 0 0 0  Number falls in past yr: 0 0 1 0 0  Comment - - - - -  Injury with Fall? 1 1 0 0 0  Comment Patient fractured pelvis and had a stroke - - - -  Risk for fall due to : - History of fall(s);Impaired balance/gait;Impaired mobility;Orthopedic patient - - -  Follow up - Falls evaluation completed;Education provided;Falls prevention discussed;Follow up appointment - - -   Functional Status Survey:    Vitals:   02/11/21 1225  BP: (!) 153/73  Pulse: 85  Resp: 17  Temp: (!) 97.2 F (36.2 C)  SpO2: 98%  Weight: 128 lb (58.1 kg)   Height: 5\' 6"  (1.676 m)   Body mass index is 20.66 kg/m. Physical Exam  Constitutional: Oriented to person, place, and time. Well-developed and well-nourished.  HENT:  Head: Normocephalic.  Mouth/Throat: Oropharynx is clear and moist.  Eyes: Pupils are equal, round, and reactive to light.  Neck: Neck supple.  Cardiovascular: Normal rate and normal heart sounds.  No murmur heard.Irregular Rythm Pulmonary/Chest: Effort normal and breath sounds normal. No respiratory distress. No wheezes. She has no  rales.  Abdominal: Soft. Bowel sounds are normal. No distension. There is no tenderness. There is no rebound.  Musculoskeletal: No edema.  Lymphadenopathy: none Neurological: Alert and oriented to person, place, and time. Left Hemiparesis Flacid Skin: Skin is warm and dry.  Psychiatric: Normal mood and affect. Behavior is normal. Thought content normal.    Labs reviewed: Recent Labs    08/17/20 0731 08/18/20 0255 10/12/20 0000 01/19/21 0000  NA 142 137 137 140  K 3.7 3.9 5.0 4.3  CL 105 106 101 97*  CO2 24 23 22  28*  GLUCOSE 112* 134*  --   --   BUN 24* 21 18 30*  CREATININE 0.89 1.01* 0.7 1.0  CALCIUM 9.3 8.7* 9.0 10.2   Recent Labs    03/04/20 0000 03/04/20 0955 08/17/20 0731 08/18/20 0255  AST  --  20 32 26  ALT  --  15 24 21   ALKPHOS  --   --  62 53  BILITOT  --   --  1.2 1.2  PROT  --   --  6.5 6.1*  ALBUMIN 3.4*  --  3.7 3.4*   Recent Labs    08/17/20 0731 08/17/20 1322 08/18/20 0255 08/23/20 0521 08/23/20 1621 08/30/20 0000 10/12/20 0000  WBC 15.5* 12.7* 15.9* 16.9*  --  13.9 8.7  NEUTROABS 11.9* 11.1*  --   --   --  10.40  --   HGB 11.2* 9.7* 9.1* 7.3* 8.8* 9.6* 11.5*  HCT 35.0* 31.3* 29.2* 24.1* 27.9* 29* 36  MCV 92.3 93.4 94.5 97.6  --   --   --   PLT 84* 81* 81* 138*  --  197 105*   Lab Results  Component Value Date   TSH 3.80 09/14/2015   Lab Results  Component Value Date   HGBA1C 5.7 (H) 08/19/2020   Lab Results  Component Value Date    CHOL 166 07/22/2020   HDL 53 07/22/2020   LDLCALC 106 03/25/2019   LDLDIRECT 81.3 08/19/2020   TRIG 54 07/22/2020   CHOLHDL 2 09/12/2010    Significant Diagnostic Results in last 30 days:  No results found.  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  Continues to be wheelchair dependent and one assist is dependent for transfers Has not had any recent falls Cognition good Mood stable Has appointment with Neurology in few days  Essential hypertension Blood pressure controlled on HCTZ and metoprolol Permanent atrial fibrillation (HCC) Continue on metoprolol and Digoxin Patient has been off Eliquis since her Baker's cyst hemorrhage Patient's daughter wants to know if we can restart it since she has been staying in SNF now and will have assist for her ADLS She is going to discuss this with Dr. Lovena Le and Dr. Leonie Man I told her that I will wait for the recommendation. Atrioventricular block, complete (HCC) S/p pacemaker Oropharyngeal dysphagia On Regular diet now And has gained weight  Right knee arthritis On Tramadol and Voltaren ointment  Addendum D/w Daughter about restarting Eliquis after Dr Neurology visit. She said she wants to wait and get Cardiology opinion also before making that decision.  Family/ staff Communication:   Labs/tests ordered:   Total time spent in this patient care encounter was  45_  minutes; greater than 50% of the visit spent counseling patient her daughter and staff, reviewing records , Labs and coordinating care  for problems addressed at this encounter.

## 2021-02-14 ENCOUNTER — Encounter: Payer: Self-pay | Admitting: Adult Health

## 2021-02-14 ENCOUNTER — Ambulatory Visit (INDEPENDENT_AMBULATORY_CARE_PROVIDER_SITE_OTHER): Payer: Medicare Other | Admitting: Adult Health

## 2021-02-14 VITALS — BP 147/73 | HR 55 | Ht 66.0 in | Wt 128.0 lb

## 2021-02-14 DIAGNOSIS — I639 Cerebral infarction, unspecified: Secondary | ICD-10-CM | POA: Diagnosis not present

## 2021-02-14 DIAGNOSIS — M62562 Muscle wasting and atrophy, not elsewhere classified, left lower leg: Secondary | ICD-10-CM | POA: Diagnosis not present

## 2021-02-14 DIAGNOSIS — I6389 Other cerebral infarction: Secondary | ICD-10-CM | POA: Diagnosis not present

## 2021-02-14 DIAGNOSIS — Z9181 History of falling: Secondary | ICD-10-CM | POA: Diagnosis not present

## 2021-02-14 DIAGNOSIS — R2689 Other abnormalities of gait and mobility: Secondary | ICD-10-CM | POA: Diagnosis not present

## 2021-02-14 DIAGNOSIS — I4821 Permanent atrial fibrillation: Secondary | ICD-10-CM

## 2021-02-14 DIAGNOSIS — G8114 Spastic hemiplegia affecting left nondominant side: Secondary | ICD-10-CM

## 2021-02-14 DIAGNOSIS — M84350S Stress fracture, pelvis, sequela: Secondary | ICD-10-CM | POA: Diagnosis not present

## 2021-02-14 DIAGNOSIS — R278 Other lack of coordination: Secondary | ICD-10-CM | POA: Diagnosis not present

## 2021-02-14 DIAGNOSIS — M6389 Disorders of muscle in diseases classified elsewhere, multiple sites: Secondary | ICD-10-CM | POA: Diagnosis not present

## 2021-02-14 NOTE — Progress Notes (Signed)
I agree it would not be unreasonable to start Eliquis particularly if she is not having frequent falls 2-3 times per week and hurting herself

## 2021-02-14 NOTE — Progress Notes (Signed)
[facility] [address] [place]. [place] [address] [phone]       OFFICE CONSULT NOTE  [name] Date of Birth:  [birth year] Medical Record Number:  [id number]   Referring MD: Susan [place]  Reason for Referral: Stroke  Chief Complaint  Patient presents with  . Follow-up    RM 14 with daughter Susan [place]) Pt is      HPI:   Today, 02/14/2021, Susan Conway returns 87 for 3 month stroke follow-up.  She continues to reside at [facility] and accompanied by daughter, Susan [place]   Stable from stroke standpoint without new stroke/TIA symptoms Reports residual left-sided weakness but improving since prior visit Continues to work with PT/OT at [facility] -ambulating short distance with hemiwalker and denies any recent falls Denies residual dysphagia and currently tolerating a regular diet  Compliant on aspirin -denies associated side effects Blood pressure today 147/73  She does question if restarting Eliquis would be a possibility for stroke prevention with history of A. Fib -previously deemed not a good AC candidate due to fall risk and advanced age - she has not had f/u with cardiology since stroke d/c  No further concerns at this time    History provided for reference purposes only Initial visit 11/15/2020 Dr. Leonie [place]: Susan Conway is an 87 year old Caucasian lady seen today for initial office consultation visit for stroke.  She is accompanied by her daughter.  History is obtained from them and review of electronic medical records and personal review of imaging studies.  She has past medical history of hypertension, diabetes, atrial fibrillation was not on anticoagulation and was admitted recently in October 21 secondary to pelvic fracture with large extraperitoneal hematoma.  While admitted to [facility] she developed sudden onset neurological change on 08/17/2020 at 1927 when she developed left hemiparesis.  Code stroke was called.  CT scan of the head was obtained  which showed no acute hemorrhage.  Aspect score was 10.  Patient has history of IV dye allergy and contrast was not administered.  She is not a tPA candidate due to the large retroperitoneal bleed.  She had an MRI scan of the brain which showed a large right basal ganglia stroke with MRA of the brain showing loss of focal signal loss in the right M2 MCA but preserved distal flow.  NIH stroke scale was 11 on admission.  Echocardiogram showed normal ejection fraction of 60 to 65% hemoglobin A1c was 5.7.  LDL cholesterol was 81.3 mg percent.  Patient was started on aspirin and was transferred to [facility] for [facility] and has made very slow progress and she is continuing ongoing physical occupational therapy but is able to walk with one-person assist with hemiwalker.  She has now been transferred to the skilled nursing section.  In fact had a fall this morning and she reached down to pick up something from the floor.  She has a small left scalp hematoma and a bruise in the left frontal region.  The patient is cognitively done well without any deficits but continues to have significant left hemiplegia and can barely bend her fingers in the left hand and move her left shoulder as well as move the left hip only.  She has history of atrial fibrillation but has not been on anticoagulation due to fall risk and 87 years old.   ROS:   14 system review of systems is positive for those listed in HPI and all systems negative PMH:  Past Medical History:  Diagnosis Date  .  Actinic keratosis 05/25/2014  . Anxiety   . Atrial fibrillation ([place]) 09/21/2010  . Basal cell carcinoma 05/25/2014   Multiple removed by Dr. Danella Sensing in March 2015: right neck, left neck, scalp   . Bell's palsy 07/18/1979  . Cervicalgia 01/31/2012  . Closed fracture of lumbar vertebra without mention of spinal cord injury 07/17/2005  . Conjunctiva disorder 12/26/2010  . Coronary atherosclerosis of native coronary artery 07/17/1997  . Cramp  of limb 08/16/2009  . Degeneration of lumbar or lumbosacral intervertebral disc 01/31/2012  . Disturbance of skin sensation 08/2009  . Dizziness and giddiness 11/08/2011   vertigo  . External hemorrhoids without mention of complication 04/26/3085  . External hemorrhoids without mention of complication 57/84/6962  . Ganglion of tendon sheath 08/16/2009  . Leg cramp 10/27/2013   Most frequently the right leg.   . Long term (current) use of anticoagulants 09/2010  . Lumbago 07/2009  . Meralgia paresthetica 07/2007  . MI, old   . Myalgia and myositis, unspecified 01/17/2012  . Osteoporosis   . Other abnormal blood chemistry 04/08/2012  . Other abnormal blood chemistry 2013   hyperglycemia  . Other disorder of muscle, ligament, and fascia 04/08/2012  . Other specified cardiac dysrhythmias(427.89) 06/27/2010  . Pacemaker 05/06/2020  . Pain in joint, ankle and foot 10/27/2013   Bilateral since 1998   . Pain in joint, shoulder region 08/12/2012  . Pain in joint, upper arm 12/26/2010  . Pain in limb 01/11/2011  . Pain in thoracic spine 01/31/2012  . 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 PVC's (premature ventricular contractions)   . Rash and other nonspecific skin eruption 08/02/2011  . Senile osteoporosis 07/17/1993  . Stroke ([place])   . Unspecified essential hypertension 07/17/1997  . Varicose veins of lower extremities 08/16/2009  . Varicose veins of lower extremities 08/16/2009    Social History:  Social History   Socioeconomic History  . Marital status: Widowed    Spouse name: Not on file  . Number of children: Not on file  . Years of education: Not on file  . Highest education level: Not on file  Occupational History  . Occupation: Retired Pharmacist, hospital  Tobacco Use  . Smoking status: Never Smoker  . Smokeless tobacco: Never Used  Vaping Use  . Vaping Use: Never used  Substance and Sexual Activity  . Alcohol use: Yes    Comment: occasional wine  . Drug use: No  . Sexual activity:  Never  Other Topics Concern  . Not on file  Social History Narrative   Living at [facility] since 2010   Widowed 09/03/2016   Never smoked   Alcohol occasional wine   Exercise, house work    DNR , POA, Living Will   Social Determinants of Health   Financial Resource Strain: Not on file  Food Insecurity: Not on file  Transportation Needs: Not on file  Physical Activity: Not on file  Stress: Not on file  Social Connections: Not on file  Intimate Partner Violence: Not on file    Medications:   Current Outpatient Medications on File Prior to Visit  Medication Sig Dispense Refill  . acetaminophen (TYLENOL) 500 MG tablet Take 1,000 mg by mouth every 6 (six) hours as needed for mild pain (pain.).     Marland Kitchen aspirin EC 81 MG EC tablet Take 1 tablet (81 mg total) by mouth daily. Swallow whole. 30 tablet 11  . atenolol (TENORMIN) 50 MG tablet Take 50 mg by mouth 2 (two) times  daily.    . calcium-vitamin D (OSCAL WITH D) 500-200 MG-UNIT tablet Take 1 tablet by mouth daily.    . diclofenac Sodium (VOLTAREN) 1 % GEL Apply 4 g topically 4 (four) times daily as needed. Left knee    . digoxin (LANOXIN) 0.125 MG tablet Take 0.125 mg by mouth daily.    . hydrALAZINE (APRESOLINE) 10 MG tablet Take 10 mg by mouth 3 (three) times daily. TID PRN for SBP >170    . hydrochlorothiazide (HYDRODIURIL) 25 MG tablet Take 25 mg by mouth daily.    Marland Kitchen LORazepam (ATIVAN) 1 MG tablet Take 1 tablet (1 mg total) by mouth at bedtime. 30 tablet 5  . ondansetron (ZOFRAN) 4 MG tablet Take 1 tablet (4 mg total) by mouth every 6 (six) hours as needed for nausea. 20 tablet 0  . polyethylene glycol (MIRALAX / GLYCOLAX) 17 g packet Take 17 g by mouth every other day.    . traMADol (ULTRAM) 50 MG tablet Take 1 tablet (50 mg total) by mouth every 6 (six) hours as needed for moderate pain or severe pain. 120 tablet 0   No current facility-administered medications on file prior to visit.    Allergies:   Allergies  Allergen  Reactions  . Iodinated Diagnostic Agents Nausea Only and Other (See Comments)    Severe nausea and also passed out  . Iodine   . Other     Seasonal Allergies.  . Bactrim Rash  . Relafen [Nabumetone] Rash  . Sulfanilamide Rash    Physical Exam Today's Vitals   02/14/21 1104  BP: (!) 147/73  Pulse: (!) 55  Weight: 128 lb (58.1 kg)  Height: 5\' 6"  (1.676 m)   Body mass index is 20.66 kg/m.   General: Frail very pleasant elderly Caucasian lady, seated, in no evident distress Head: head normocephalic and atraumatic.   Neck: supple with no carotid or supraclavicular bruits Cardiovascular: irregular rate and rhythm, no murmurs Musculoskeletal: no deformity Skin:  no rash/petichiae Vascular:  Normal pulses all extremities  Neurologic Exam Mental Status: Awake and fully alert.  Fluent speech and language.  Oriented to place and time. Recent and remote memory diminished attention span, concentration and fund of knowledge diminished slightly. Mood and affect appropriate.  Cranial Nerves: Pupils equal, briskly reactive to light. Extraocular movements full without nystagmus. Visual fields full to confrontation. Hearing diminished bilaterally.  Left lower face weakness.  Sensation intact. Face, tongue, palate moves normally and symmetrically.  Motor: Full strength right upper and lower extremity  LUE: 2/5 deltoid; 3/5 elbow extension and flexion; 2/5 grip -increased tone throughout greater distally LLE: 2-3/5 hip flexor; 3+-4/5 knee extension and flexion; 2/5 ankle dorsiflexion and plantarflexion - mild increased tone greater in hip flexor Sensory.: intact to touch , pinprick , position and vibratory sensation.  Coordination: Intact on the right side impaired on the left side proportionate to the weakness Gait and Station: Deferred as hemiwalker not present during visit Reflexes: 2+ and asymmetric and brisk on the left. Toes downgoing.      ASSESSMENT/PLAN: 87 year old Caucasian lady  with large right basal ganglia infarct in October 2021 from atrial fibrillation.  Vascular risk factors include A. Fib, HTN, hx of GI bleed, AV block s/p pacer and advanced age    R BG stroke -Residual left spastic hemiparesis - overall improvement since prior visit!  Encouraged continued participation with PT/OT at facility for hopeful further recovery.  Discussed use of hemiwalker at all times unless otherwise instructed for fall  prevention -Continue aspirin 81 mg daily for secondary stroke prevention -maintain strict control of hypertension with blood pressure goal below 130/90 and lipids with LDL cholesterol goal below 70 mg/dL.  Permanent atrial fibrillation -deemed to not be a good AC candidate d/t high fall risk, hx of GI bleed on warfarin and hx of ruptured Baker's cyst with hemorrhage on Eliquis -encouraged to schedule f/u visit with Dr. Lovena Le to further discuss risk versus benefit of AC use as even though she has not had any recent levels she is likely still a high fall risk with prior bleeding issues on [facility] but recent stroke likely in setting of A fib not on Eliquis - will also consult with Dr. Leonie [place] -CHA2DS2-VASc score of at least 6 -HAS BLED 3 (Alternatives to anticoagulation should be considered: Patient is at high risk for major bleeding)   Follow up in 4 months or call earlier if needed    CC:  [place] provider: Dr. Abelino Derrick, Rene Kocher, MD   I spent 30 minutes of face-to-face and non-face-to-face time with patient and daughter.  This included previsit chart review, lab review, study review, order entry, electronic health record documentation, patient and daughter education and discussion regarding history of prior stroke with etiology and residual deficits, atrial fibrillation and risk vs benefit of AC use, importance of managing stroke risk factors and answered all other questions to patient and daughter satisfaction  Frann Rider, AGNP-BC  [facility] [address] [place], [place] [address]  Phone [phone] Fax [phone] Note: This document was prepared with digital dictation and possible smart phrase technology. Any transcriptional errors that result from this process are unintentional.

## 2021-02-14 NOTE — Patient Instructions (Signed)
Continue aspirin 81 mg daily  and atorvastatin  for secondary stroke prevention  Please follow up with Dr. Lovena Le in regards to restarting a blood thinner in regards to risk vs benefit  Continue working with physical and Occupational Therapy for hopeful ongoing recovery  Continue to follow up with PCP regarding cholesterol and blood pressure management  Maintain strict control of hypertension with blood pressure goal below 130/90 and cholesterol with LDL cholesterol (bad cholesterol) goal below 70 mg/dL.       Followup in the future with me in 4 months or call earlier if needed       Thank you for coming to see Susan [place] at [facility]. I hope we have been able to provide you high quality care today.  You may receive a patient satisfaction survey over the next few weeks. We would appreciate your feedback and comments so that we may continue to improve ourselves and the health of our patients.

## 2021-02-15 DIAGNOSIS — I6389 Other cerebral infarction: Secondary | ICD-10-CM | POA: Diagnosis not present

## 2021-02-15 DIAGNOSIS — Z9181 History of falling: Secondary | ICD-10-CM | POA: Diagnosis not present

## 2021-02-15 DIAGNOSIS — R278 Other lack of coordination: Secondary | ICD-10-CM | POA: Diagnosis not present

## 2021-02-15 DIAGNOSIS — M84350S Stress fracture, pelvis, sequela: Secondary | ICD-10-CM | POA: Diagnosis not present

## 2021-02-15 DIAGNOSIS — R2689 Other abnormalities of gait and mobility: Secondary | ICD-10-CM | POA: Diagnosis not present

## 2021-02-15 DIAGNOSIS — M62562 Muscle wasting and atrophy, not elsewhere classified, left lower leg: Secondary | ICD-10-CM | POA: Diagnosis not present

## 2021-02-15 DIAGNOSIS — M6389 Disorders of muscle in diseases classified elsewhere, multiple sites: Secondary | ICD-10-CM | POA: Diagnosis not present

## 2021-02-16 ENCOUNTER — Telehealth: Payer: Self-pay | Admitting: Adult Health

## 2021-02-16 DIAGNOSIS — M84350S Stress fracture, pelvis, sequela: Secondary | ICD-10-CM | POA: Diagnosis not present

## 2021-02-16 DIAGNOSIS — R278 Other lack of coordination: Secondary | ICD-10-CM | POA: Diagnosis not present

## 2021-02-16 DIAGNOSIS — R2689 Other abnormalities of gait and mobility: Secondary | ICD-10-CM | POA: Diagnosis not present

## 2021-02-16 DIAGNOSIS — Z9181 History of falling: Secondary | ICD-10-CM | POA: Diagnosis not present

## 2021-02-16 DIAGNOSIS — I6389 Other cerebral infarction: Secondary | ICD-10-CM | POA: Diagnosis not present

## 2021-02-16 DIAGNOSIS — M6389 Disorders of muscle in diseases classified elsewhere, multiple sites: Secondary | ICD-10-CM | POA: Diagnosis not present

## 2021-02-16 DIAGNOSIS — M62562 Muscle wasting and atrophy, not elsewhere classified, left lower leg: Secondary | ICD-10-CM | POA: Diagnosis not present

## 2021-02-16 NOTE — Telephone Encounter (Signed)
Misty-from [facility] called needing to speak to the RN about the medication directions that the pt was given on her last visit. Please advise.

## 2021-02-16 NOTE — Telephone Encounter (Signed)
Called and LMVM for Misty that per JM/NP note to continue aspirin 81mg  po daily along with atovastatin.  Per blood thinner (eliquis) speak to Dr. Lovena Le.  Please call back if questions.

## 2021-02-17 DIAGNOSIS — R2689 Other abnormalities of gait and mobility: Secondary | ICD-10-CM | POA: Diagnosis not present

## 2021-02-17 DIAGNOSIS — R278 Other lack of coordination: Secondary | ICD-10-CM | POA: Diagnosis not present

## 2021-02-17 DIAGNOSIS — Z9181 History of falling: Secondary | ICD-10-CM | POA: Diagnosis not present

## 2021-02-17 DIAGNOSIS — I6389 Other cerebral infarction: Secondary | ICD-10-CM | POA: Diagnosis not present

## 2021-02-17 DIAGNOSIS — M6389 Disorders of muscle in diseases classified elsewhere, multiple sites: Secondary | ICD-10-CM | POA: Diagnosis not present

## 2021-02-17 DIAGNOSIS — M84350S Stress fracture, pelvis, sequela: Secondary | ICD-10-CM | POA: Diagnosis not present

## 2021-02-17 DIAGNOSIS — M62562 Muscle wasting and atrophy, not elsewhere classified, left lower leg: Secondary | ICD-10-CM | POA: Diagnosis not present

## 2021-02-17 NOTE — Telephone Encounter (Signed)
Spoke to [employer] Wanted to let us know that pt was not on atorvastatin.   Looking from Dr. Leonie Man or Jessica's note that no atorvastatin.  I will let her know and get back to her if a change is needed.  Pcp, Dr. Lyndel Safe, at [facility] can f/u on this as well for pt.

## 2021-02-17 NOTE — Telephone Encounter (Signed)
LMVM for Susan [place] returning call re: needing instructions.  If she needs [place] to call us back.  (I did LM yesterday with noted OV plan).

## 2021-02-18 DIAGNOSIS — R2689 Other abnormalities of gait and mobility: Secondary | ICD-10-CM | POA: Diagnosis not present

## 2021-02-18 DIAGNOSIS — I6389 Other cerebral infarction: Secondary | ICD-10-CM | POA: Diagnosis not present

## 2021-02-18 DIAGNOSIS — M84350S Stress fracture, pelvis, sequela: Secondary | ICD-10-CM | POA: Diagnosis not present

## 2021-02-18 DIAGNOSIS — Z9181 History of falling: Secondary | ICD-10-CM | POA: Diagnosis not present

## 2021-02-18 DIAGNOSIS — R278 Other lack of coordination: Secondary | ICD-10-CM | POA: Diagnosis not present

## 2021-02-18 DIAGNOSIS — M62562 Muscle wasting and atrophy, not elsewhere classified, left lower leg: Secondary | ICD-10-CM | POA: Diagnosis not present

## 2021-02-18 DIAGNOSIS — M6389 Disorders of muscle in diseases classified elsewhere, multiple sites: Secondary | ICD-10-CM | POA: Diagnosis not present

## 2021-02-21 DIAGNOSIS — R278 Other lack of coordination: Secondary | ICD-10-CM | POA: Diagnosis not present

## 2021-02-21 DIAGNOSIS — M6389 Disorders of muscle in diseases classified elsewhere, multiple sites: Secondary | ICD-10-CM | POA: Diagnosis not present

## 2021-02-21 DIAGNOSIS — R2689 Other abnormalities of gait and mobility: Secondary | ICD-10-CM | POA: Diagnosis not present

## 2021-02-21 DIAGNOSIS — M62562 Muscle wasting and atrophy, not elsewhere classified, left lower leg: Secondary | ICD-10-CM | POA: Diagnosis not present

## 2021-02-21 DIAGNOSIS — Z9181 History of falling: Secondary | ICD-10-CM | POA: Diagnosis not present

## 2021-02-21 DIAGNOSIS — M84350S Stress fracture, pelvis, sequela: Secondary | ICD-10-CM | POA: Diagnosis not present

## 2021-02-21 DIAGNOSIS — I6389 Other cerebral infarction: Secondary | ICD-10-CM | POA: Diagnosis not present

## 2021-02-21 NOTE — Telephone Encounter (Signed)
This was a typo in her AVS printout -please disregard

## 2021-02-21 NOTE — Telephone Encounter (Signed)
Noted  

## 2021-02-22 DIAGNOSIS — R278 Other lack of coordination: Secondary | ICD-10-CM | POA: Diagnosis not present

## 2021-02-22 DIAGNOSIS — M6389 Disorders of muscle in diseases classified elsewhere, multiple sites: Secondary | ICD-10-CM | POA: Diagnosis not present

## 2021-02-22 DIAGNOSIS — M84350S Stress fracture, pelvis, sequela: Secondary | ICD-10-CM | POA: Diagnosis not present

## 2021-02-22 DIAGNOSIS — I6389 Other cerebral infarction: Secondary | ICD-10-CM | POA: Diagnosis not present

## 2021-02-22 DIAGNOSIS — R2689 Other abnormalities of gait and mobility: Secondary | ICD-10-CM | POA: Diagnosis not present

## 2021-02-22 DIAGNOSIS — Z9181 History of falling: Secondary | ICD-10-CM | POA: Diagnosis not present

## 2021-02-22 DIAGNOSIS — M62562 Muscle wasting and atrophy, not elsewhere classified, left lower leg: Secondary | ICD-10-CM | POA: Diagnosis not present

## 2021-02-23 DIAGNOSIS — M6389 Disorders of muscle in diseases classified elsewhere, multiple sites: Secondary | ICD-10-CM | POA: Diagnosis not present

## 2021-02-23 DIAGNOSIS — I6389 Other cerebral infarction: Secondary | ICD-10-CM | POA: Diagnosis not present

## 2021-02-23 DIAGNOSIS — R278 Other lack of coordination: Secondary | ICD-10-CM | POA: Diagnosis not present

## 2021-02-23 DIAGNOSIS — R2689 Other abnormalities of gait and mobility: Secondary | ICD-10-CM | POA: Diagnosis not present

## 2021-02-23 DIAGNOSIS — M84350S Stress fracture, pelvis, sequela: Secondary | ICD-10-CM | POA: Diagnosis not present

## 2021-02-23 DIAGNOSIS — Z9181 History of falling: Secondary | ICD-10-CM | POA: Diagnosis not present

## 2021-02-23 DIAGNOSIS — M62562 Muscle wasting and atrophy, not elsewhere classified, left lower leg: Secondary | ICD-10-CM | POA: Diagnosis not present

## 2021-02-24 DIAGNOSIS — R2689 Other abnormalities of gait and mobility: Secondary | ICD-10-CM | POA: Diagnosis not present

## 2021-02-24 DIAGNOSIS — I6389 Other cerebral infarction: Secondary | ICD-10-CM | POA: Diagnosis not present

## 2021-02-24 DIAGNOSIS — M62562 Muscle wasting and atrophy, not elsewhere classified, left lower leg: Secondary | ICD-10-CM | POA: Diagnosis not present

## 2021-02-24 DIAGNOSIS — R278 Other lack of coordination: Secondary | ICD-10-CM | POA: Diagnosis not present

## 2021-02-24 DIAGNOSIS — M6389 Disorders of muscle in diseases classified elsewhere, multiple sites: Secondary | ICD-10-CM | POA: Diagnosis not present

## 2021-02-24 DIAGNOSIS — M84350S Stress fracture, pelvis, sequela: Secondary | ICD-10-CM | POA: Diagnosis not present

## 2021-02-24 DIAGNOSIS — Z9181 History of falling: Secondary | ICD-10-CM | POA: Diagnosis not present

## 2021-02-25 DIAGNOSIS — M62562 Muscle wasting and atrophy, not elsewhere classified, left lower leg: Secondary | ICD-10-CM | POA: Diagnosis not present

## 2021-02-25 DIAGNOSIS — M6389 Disorders of muscle in diseases classified elsewhere, multiple sites: Secondary | ICD-10-CM | POA: Diagnosis not present

## 2021-02-25 DIAGNOSIS — M84350S Stress fracture, pelvis, sequela: Secondary | ICD-10-CM | POA: Diagnosis not present

## 2021-02-25 DIAGNOSIS — R278 Other lack of coordination: Secondary | ICD-10-CM | POA: Diagnosis not present

## 2021-02-25 DIAGNOSIS — I6389 Other cerebral infarction: Secondary | ICD-10-CM | POA: Diagnosis not present

## 2021-02-25 DIAGNOSIS — Z9181 History of falling: Secondary | ICD-10-CM | POA: Diagnosis not present

## 2021-02-25 DIAGNOSIS — R2689 Other abnormalities of gait and mobility: Secondary | ICD-10-CM | POA: Diagnosis not present

## 2021-02-27 DIAGNOSIS — Z9181 History of falling: Secondary | ICD-10-CM | POA: Diagnosis not present

## 2021-02-27 DIAGNOSIS — I6389 Other cerebral infarction: Secondary | ICD-10-CM | POA: Diagnosis not present

## 2021-02-27 DIAGNOSIS — M62562 Muscle wasting and atrophy, not elsewhere classified, left lower leg: Secondary | ICD-10-CM | POA: Diagnosis not present

## 2021-02-27 DIAGNOSIS — R2689 Other abnormalities of gait and mobility: Secondary | ICD-10-CM | POA: Diagnosis not present

## 2021-02-27 DIAGNOSIS — M84350S Stress fracture, pelvis, sequela: Secondary | ICD-10-CM | POA: Diagnosis not present

## 2021-02-27 DIAGNOSIS — R278 Other lack of coordination: Secondary | ICD-10-CM | POA: Diagnosis not present

## 2021-02-28 DIAGNOSIS — I6389 Other cerebral infarction: Secondary | ICD-10-CM | POA: Diagnosis not present

## 2021-02-28 DIAGNOSIS — R2689 Other abnormalities of gait and mobility: Secondary | ICD-10-CM | POA: Diagnosis not present

## 2021-02-28 DIAGNOSIS — M6389 Disorders of muscle in diseases classified elsewhere, multiple sites: Secondary | ICD-10-CM | POA: Diagnosis not present

## 2021-02-28 DIAGNOSIS — M62562 Muscle wasting and atrophy, not elsewhere classified, left lower leg: Secondary | ICD-10-CM | POA: Diagnosis not present

## 2021-02-28 DIAGNOSIS — M84350S Stress fracture, pelvis, sequela: Secondary | ICD-10-CM | POA: Diagnosis not present

## 2021-02-28 DIAGNOSIS — R278 Other lack of coordination: Secondary | ICD-10-CM | POA: Diagnosis not present

## 2021-02-28 DIAGNOSIS — Z9181 History of falling: Secondary | ICD-10-CM | POA: Diagnosis not present

## 2021-03-01 DIAGNOSIS — M84350S Stress fracture, pelvis, sequela: Secondary | ICD-10-CM | POA: Diagnosis not present

## 2021-03-01 DIAGNOSIS — R2689 Other abnormalities of gait and mobility: Secondary | ICD-10-CM | POA: Diagnosis not present

## 2021-03-01 DIAGNOSIS — I6389 Other cerebral infarction: Secondary | ICD-10-CM | POA: Diagnosis not present

## 2021-03-01 DIAGNOSIS — M62562 Muscle wasting and atrophy, not elsewhere classified, left lower leg: Secondary | ICD-10-CM | POA: Diagnosis not present

## 2021-03-01 DIAGNOSIS — Z9181 History of falling: Secondary | ICD-10-CM | POA: Diagnosis not present

## 2021-03-01 DIAGNOSIS — R278 Other lack of coordination: Secondary | ICD-10-CM | POA: Diagnosis not present

## 2021-03-01 DIAGNOSIS — M6389 Disorders of muscle in diseases classified elsewhere, multiple sites: Secondary | ICD-10-CM | POA: Diagnosis not present

## 2021-03-02 DIAGNOSIS — R2689 Other abnormalities of gait and mobility: Secondary | ICD-10-CM | POA: Diagnosis not present

## 2021-03-02 DIAGNOSIS — M62562 Muscle wasting and atrophy, not elsewhere classified, left lower leg: Secondary | ICD-10-CM | POA: Diagnosis not present

## 2021-03-02 DIAGNOSIS — M84350S Stress fracture, pelvis, sequela: Secondary | ICD-10-CM | POA: Diagnosis not present

## 2021-03-02 DIAGNOSIS — I6389 Other cerebral infarction: Secondary | ICD-10-CM | POA: Diagnosis not present

## 2021-03-02 DIAGNOSIS — R278 Other lack of coordination: Secondary | ICD-10-CM | POA: Diagnosis not present

## 2021-03-02 DIAGNOSIS — Z9181 History of falling: Secondary | ICD-10-CM | POA: Diagnosis not present

## 2021-03-02 DIAGNOSIS — M6389 Disorders of muscle in diseases classified elsewhere, multiple sites: Secondary | ICD-10-CM | POA: Diagnosis not present

## 2021-03-03 DIAGNOSIS — M84350S Stress fracture, pelvis, sequela: Secondary | ICD-10-CM | POA: Diagnosis not present

## 2021-03-03 DIAGNOSIS — M6389 Disorders of muscle in diseases classified elsewhere, multiple sites: Secondary | ICD-10-CM | POA: Diagnosis not present

## 2021-03-03 DIAGNOSIS — R278 Other lack of coordination: Secondary | ICD-10-CM | POA: Diagnosis not present

## 2021-03-03 DIAGNOSIS — I6389 Other cerebral infarction: Secondary | ICD-10-CM | POA: Diagnosis not present

## 2021-03-03 DIAGNOSIS — Z9181 History of falling: Secondary | ICD-10-CM | POA: Diagnosis not present

## 2021-03-04 ENCOUNTER — Ambulatory Visit (INDEPENDENT_AMBULATORY_CARE_PROVIDER_SITE_OTHER): Payer: Medicare Other

## 2021-03-04 DIAGNOSIS — M62562 Muscle wasting and atrophy, not elsewhere classified, left lower leg: Secondary | ICD-10-CM | POA: Diagnosis not present

## 2021-03-04 DIAGNOSIS — I472 Ventricular tachycardia, unspecified: Secondary | ICD-10-CM

## 2021-03-04 DIAGNOSIS — R2689 Other abnormalities of gait and mobility: Secondary | ICD-10-CM | POA: Diagnosis not present

## 2021-03-04 DIAGNOSIS — I6389 Other cerebral infarction: Secondary | ICD-10-CM | POA: Diagnosis not present

## 2021-03-04 DIAGNOSIS — R278 Other lack of coordination: Secondary | ICD-10-CM | POA: Diagnosis not present

## 2021-03-04 DIAGNOSIS — Z9181 History of falling: Secondary | ICD-10-CM | POA: Diagnosis not present

## 2021-03-04 DIAGNOSIS — M84350S Stress fracture, pelvis, sequela: Secondary | ICD-10-CM | POA: Diagnosis not present

## 2021-03-04 LAB — CUP PACEART REMOTE DEVICE CHECK
Battery Remaining Longevity: 138 mo
Battery Remaining Percentage: 95.5 %
Battery Voltage: 3.02 V
Brady Statistic RV Percent Paced: 3.1 %
Date Time Interrogation Session: 20220429024911
Implantable Lead Implant Date: 20200720
Implantable Lead Location: 753860
Implantable Pulse Generator Implant Date: 20200720
Lead Channel Impedance Value: 560 Ohm
Lead Channel Pacing Threshold Amplitude: 1 V
Lead Channel Pacing Threshold Pulse Width: 0.5 ms
Lead Channel Sensing Intrinsic Amplitude: 8.3 mV
Lead Channel Setting Pacing Amplitude: 2.5 V
Lead Channel Setting Pacing Pulse Width: 0.5 ms
Lead Channel Setting Sensing Sensitivity: 2 mV
Pulse Gen Model: 1272
Pulse Gen Serial Number: 9122450

## 2021-03-07 DIAGNOSIS — M84350S Stress fracture, pelvis, sequela: Secondary | ICD-10-CM | POA: Diagnosis not present

## 2021-03-07 DIAGNOSIS — M6389 Disorders of muscle in diseases classified elsewhere, multiple sites: Secondary | ICD-10-CM | POA: Diagnosis not present

## 2021-03-07 DIAGNOSIS — R2689 Other abnormalities of gait and mobility: Secondary | ICD-10-CM | POA: Diagnosis not present

## 2021-03-07 DIAGNOSIS — I6389 Other cerebral infarction: Secondary | ICD-10-CM | POA: Diagnosis not present

## 2021-03-07 DIAGNOSIS — Z9181 History of falling: Secondary | ICD-10-CM | POA: Diagnosis not present

## 2021-03-07 DIAGNOSIS — M62562 Muscle wasting and atrophy, not elsewhere classified, left lower leg: Secondary | ICD-10-CM | POA: Diagnosis not present

## 2021-03-07 DIAGNOSIS — R278 Other lack of coordination: Secondary | ICD-10-CM | POA: Diagnosis not present

## 2021-03-08 ENCOUNTER — Non-Acute Institutional Stay (SKILLED_NURSING_FACILITY): Payer: Medicare Other | Admitting: Orthopedic Surgery

## 2021-03-08 ENCOUNTER — Encounter: Payer: Self-pay | Admitting: Orthopedic Surgery

## 2021-03-08 DIAGNOSIS — I442 Atrioventricular block, complete: Secondary | ICD-10-CM

## 2021-03-08 DIAGNOSIS — B351 Tinea unguium: Secondary | ICD-10-CM | POA: Diagnosis not present

## 2021-03-08 DIAGNOSIS — M6389 Disorders of muscle in diseases classified elsewhere, multiple sites: Secondary | ICD-10-CM | POA: Diagnosis not present

## 2021-03-08 DIAGNOSIS — K5901 Slow transit constipation: Secondary | ICD-10-CM | POA: Diagnosis not present

## 2021-03-08 DIAGNOSIS — R1312 Dysphagia, oropharyngeal phase: Secondary | ICD-10-CM | POA: Diagnosis not present

## 2021-03-08 DIAGNOSIS — R2689 Other abnormalities of gait and mobility: Secondary | ICD-10-CM | POA: Diagnosis not present

## 2021-03-08 DIAGNOSIS — I69354 Hemiplegia and hemiparesis following cerebral infarction affecting left non-dominant side: Secondary | ICD-10-CM | POA: Diagnosis not present

## 2021-03-08 DIAGNOSIS — M84350S Stress fracture, pelvis, sequela: Secondary | ICD-10-CM | POA: Diagnosis not present

## 2021-03-08 DIAGNOSIS — J302 Other seasonal allergic rhinitis: Secondary | ICD-10-CM

## 2021-03-08 DIAGNOSIS — I4821 Permanent atrial fibrillation: Secondary | ICD-10-CM | POA: Diagnosis not present

## 2021-03-08 DIAGNOSIS — M25561 Pain in right knee: Secondary | ICD-10-CM | POA: Diagnosis not present

## 2021-03-08 DIAGNOSIS — I10 Essential (primary) hypertension: Secondary | ICD-10-CM | POA: Diagnosis not present

## 2021-03-08 DIAGNOSIS — I6389 Other cerebral infarction: Secondary | ICD-10-CM | POA: Diagnosis not present

## 2021-03-08 DIAGNOSIS — M62562 Muscle wasting and atrophy, not elsewhere classified, left lower leg: Secondary | ICD-10-CM | POA: Diagnosis not present

## 2021-03-08 DIAGNOSIS — Z9181 History of falling: Secondary | ICD-10-CM | POA: Diagnosis not present

## 2021-03-08 DIAGNOSIS — R278 Other lack of coordination: Secondary | ICD-10-CM | POA: Diagnosis not present

## 2021-03-08 NOTE — Progress Notes (Signed)
Location:    [facility] Room Number: 118 Place of Service:  SNF ([phone]) Provider:  Windell Moulding, NP  [name], MD  Patient Care Team: [name], MD as PCP - General (Internal Medicine) [name], MD as PCP - Electrophysiology (Cardiology) [name], MD as Consulting Physician (Dermatology) [name], MD as Consulting Physician (Cardiology) [facility] [name], MD as Consulting Physician (Dermatology)  Extended Emergency Contact Information Primary Emergency Contact: [name] of [place] Phone: [phone] Mobile Phone: [phone] Relation: Daughter Secondary Emergency Contact: [name],[name]  [name] of [place] Phone: [phone] Relation: Son  Code Status:  DNR Goals of care: Advanced Directive information Advanced Directives 03/08/2021  Does Patient Have a Medical Advance Directive? Yes  Type of Paramedic of [address];Living will;Out of facility DNR (pink MOST or yellow form)  Does patient want to make changes to medical advance directive? No - Patient declined  Copy of [place] in Chart? Yes - validated most recent copy scanned in chart (See row information)  Pre-existing out of facility DNR order (yellow form or pink MOST form) -     Chief Complaint  Patient presents with  . Medical Management of Chronic Issues  . Health Maintenance    TDAP    HPI:  Pt is a 87 y.o. female seen today for medical management of chronic diseases.    Daughter present during encounter.   Her mobility continues to improve from her stroke 08/2020. Remains on skilled unit. She continues to work with PT/OT. Ambulating short distances with cane. Also uses power wheelchair. Her goal is to continue walking short distances, but prefers to use power wheelchair for the majority of her movement.   Knee pain still persists. Left knee  pain began after stroke. Right knee began to hurt 3 months after stroke. Uses Voltaren gel four times daily for knee pain. Describes knee pain as "it hurts." Denies radiation. Would like to stop using Voltaren gel on right knee to see if medication is helping.   Complains of runny nose and sinus congestion. Use to take Claritin but did not think it was helpful. Would like to trial allergy medicine.   Seen by podiatry in past. Upset great toes are thick and discolored. Would like to try tea tree oil to toenails twice daily.   04/29- pacemaker check complete  Daughter plans to schedule cardiology follow up to discuss statin and eliquis.   Recently seen by dentist- plans to see oral surgeon about tooth removal. Denies dental pain.   No recent falls or injuries.   Recent blood pressures are as follows:  05/03- 134/85  04/29- 159/96  04/27- 146/81  04/26- 140/73  04/23- 156/84  Recent weights are as follows:   04/01- 128 lbs  03/01- 129.6 lbs  Nurse does not report any concerns, vitals stable.      Past Medical History:  Diagnosis Date  . Actinic keratosis 05/25/2014  . Anxiety   . Atrial fibrillation ([place]) 09/21/2010  . Basal cell carcinoma 05/25/2014   Multiple removed by Dr. Danella Sensing in March 2015: right neck, left neck, scalp   . Bell's palsy 07/18/1979  . Cervicalgia 01/31/2012  . Closed fracture of lumbar vertebra without mention of spinal cord injury 07/17/2005  . Conjunctiva disorder 12/26/2010  . Coronary atherosclerosis of native coronary artery 07/17/1997  . Cramp of limb 08/16/2009  . Degeneration of lumbar or lumbosacral  intervertebral disc 01/31/2012  . Disturbance of skin sensation 08/2009  . Dizziness and giddiness 11/08/2011   vertigo  . External hemorrhoids without mention of complication 4/81/8563  . External hemorrhoids without mention of complication 14/97/0263  . Ganglion of tendon sheath 08/16/2009  . Leg cramp 10/27/2013   Most frequently the right  leg.   . Long term (current) use of anticoagulants 09/2010  . Lumbago 07/2009  . Meralgia paresthetica 07/2007  . MI, old   . Myalgia and myositis, unspecified 01/17/2012  . Osteoporosis   . Other abnormal blood chemistry 04/08/2012  . Other abnormal blood chemistry 2013   hyperglycemia  . Other disorder of muscle, ligament, and fascia 04/08/2012  . Other specified cardiac dysrhythmias(427.89) 06/27/2010  . Pacemaker 05/06/2020  . Pain in joint, ankle and foot 10/27/2013   Bilateral since 1998   . Pain in joint, shoulder region 08/12/2012  . Pain in joint, upper arm 12/26/2010  . Pain in limb 01/11/2011  . Pain in thoracic spine 01/31/2012  . 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 PVC's (premature ventricular contractions)   . Rash and other nonspecific skin eruption 08/02/2011  . Senile osteoporosis 07/17/1993  . Stroke ([place])   . Unspecified essential hypertension 07/17/1997  . Varicose veins of lower extremities 08/16/2009  . Varicose veins of lower extremities 08/16/2009   Past Surgical History:  Procedure Laterality Date  . COLONOSCOPY WITH PROPOFOL N/A 10/19/2015   Procedure: COLONOSCOPY WITH PROPOFOL;  Surgeon: Gatha Mayer, MD;  Location: [facility];  Service: Endoscopy;  Laterality: N/A;  . CORONARY ARTERY BYPASS GRAFT  1998   x2; LIMA to LAD; SVG to diagonal off bypass  . HEMORRHOID SURGERY  08/26/2012   Dr. Brantley Stage  . LOOP RECORDER INSERTION N/A 05/13/2018   Procedure: LOOP RECORDER INSERTION;  Surgeon: [name], MD;  Location: [facility];  Service: Cardiovascular;  Laterality: N/A;  . LOOP RECORDER REMOVAL N/A 05/26/2019   Procedure: LOOP RECORDER REMOVAL;  Surgeon: [name], MD;  Location: [facility];  Service: Cardiovascular;  Laterality: N/A;  . MASS EXCISION Left 11/23/2015   Procedure: LEFT WRIST EXCISION CYST;  Surgeon: Leanora Cover, MD;  Location: [facility];  Service: Orthopedics;  Laterality: Left;  . PACEMAKER IMPLANT  N/A 05/26/2019   Procedure: PACEMAKER IMPLANT;  Surgeon: [name], MD;  Location: [facility];  Service: Cardiovascular;  Laterality: N/A;  . SKIN BIOPSY  01/29/14   (R) neck; (R) scalp, 2 (L) neck; shave biopsy Superficial basal cell carcinoma Dr. Danella Sensing  . TONSILLECTOMY  1940's    Allergies  Allergen Reactions  . Iodinated Diagnostic Agents Nausea Only and Other (See Comments)    Severe nausea and also passed out  . Iodine   . Other     Seasonal Allergies.  . Bactrim Rash  . Relafen [Nabumetone] Rash  . Sulfanilamide Rash    Allergies as of 03/08/2021      Reactions   Iodinated Diagnostic Agents Nausea Only, Other (See Comments)   Severe nausea and also passed out   Iodine    Other    Seasonal Allergies.   Bactrim Rash   Relafen [nabumetone] Rash   Sulfanilamide Rash      Medication List       Accurate as of Mar 08, 2021  2:28 PM. If you have any questions, ask your nurse or doctor.        acetaminophen 500 MG tablet Commonly  known as: TYLENOL Take 1,000 mg by mouth every 6 (six) hours as needed for mild pain (pain.).   aspirin 81 MG EC tablet Take 1 tablet (81 mg total) by mouth daily. Swallow whole.   atenolol 50 MG tablet Commonly known as: TENORMIN Take 50 mg by mouth 2 (two) times daily.   calcium-vitamin D 500-200 MG-UNIT tablet Commonly known as: OSCAL WITH D Take 1 tablet by mouth daily.   diclofenac Sodium 1 % Gel Commonly known as: VOLTAREN Apply 4 g topically 4 (four) times daily as needed. Left knee, right knee, left ankle   digoxin 0.125 MG tablet Commonly known as: LANOXIN Take 0.125 mg by mouth every other day.   hydrALAZINE 10 MG tablet Commonly known as: APRESOLINE Take 10 mg by mouth 3 (three) times daily. TID PRN for SBP >170   hydrochlorothiazide 25 MG tablet Commonly known as: HYDRODIURIL Take 25 mg by mouth daily.   LORazepam 1 MG tablet Commonly known as: ATIVAN Take 1 tablet (1 mg total) by mouth at  bedtime.   ondansetron 4 MG tablet Commonly known as: ZOFRAN Take 1 tablet (4 mg total) by mouth every 6 (six) hours as needed for nausea.   polyethylene glycol 17 g packet Commonly known as: MIRALAX / GLYCOLAX Take 17 g by mouth every other day.   traMADol 50 MG tablet Commonly known as: ULTRAM Take 1 tablet (50 mg total) by mouth every 6 (six) hours as needed for moderate pain or severe pain.       Review of Systems  Constitutional: Negative for activity change, appetite change, fatigue and fever.  HENT: Positive for congestion, dental problem, hearing loss and rhinorrhea. Negative for sinus pressure, sinus pain and trouble swallowing.        Bilateral hearing aid, broken crown  Eyes: Negative for visual disturbance.       Glasses  Respiratory: Negative for cough, shortness of breath and wheezing.   Cardiovascular: Negative for chest pain and leg swelling.  Gastrointestinal: Positive for constipation. Negative for abdominal distention, abdominal pain, diarrhea and nausea.  Genitourinary: Negative for dysuria, frequency and hematuria.  Musculoskeletal: Positive for arthralgias and myalgias.       Bilateral knee pain  Skin: Negative.   Neurological: Positive for tremors and weakness. Negative for dizziness and headaches.       Left sided weakness   Psychiatric/Behavioral: Negative for dysphoric mood. The patient is not nervous/anxious.     Immunization History  Administered Date(s) Administered  . Influenza Whole 08/06/2012  . Influenza, High Dose Seasonal PF 08/15/2019, 08/27/2020  . Influenza,inj,Quad PF,6+ Mos 08/30/2018  . Influenza-Unspecified 08/06/2013, 08/24/2014, 08/26/2015, 08/31/2016, 08/27/2017, 08/27/2020  . Moderna Sars-Covid-2 Vaccination 11/16/2019, 12/16/2019, 09/16/2020  . Pneumococcal Conjugate-13 09/22/2015  . Pneumococcal Polysaccharide-23 08/29/2006  . Td 10/07/2007  . Zoster 03/12/2008  . Zoster Recombinat (Shingrix) 02/12/2018, 05/13/2018    Pertinent  Health Maintenance Due  Topic Date Due  . INFLUENZA VACCINE  06/06/2021  . DEXA SCAN  Completed  . PNA vac Low Risk Adult  Completed   Fall Risk  09/23/2020 08/26/2020 07/28/2020 02/25/2020 01/28/2020  Falls in the past year? 1 1 0 0 0  Number falls in past yr: 0 0 1 0 0  Comment - - - - -  Injury with Fall? 1 1 0 0 0  Comment Patient fractured pelvis and had a stroke - - - -  Risk for fall due to : - History of fall(s);Impaired balance/gait;Impaired mobility;Orthopedic patient - - -  Follow up - Falls evaluation completed;Education provided;Falls prevention discussed;Follow up appointment - - -   Functional Status Survey:    Vitals:   03/08/21 1421  BP: 134/85  Pulse: 79  Resp: 17  Temp: (!) 97 F (36.1 C)  SpO2: 98%  Weight: 128 lb (58.1 kg)  Height: 5\' 6"  (1.676 m)   Body mass index is 20.66 kg/m. Physical Exam Vitals reviewed.  Constitutional:      General: She is not in acute distress. HENT:     Head: Normocephalic.     Right Ear: There is no impacted cerumen.     Left Ear: There is no impacted cerumen.     Nose: Nose normal.     Mouth/Throat:     Mouth: Mucous membranes are moist.  Eyes:     General:        Right eye: No discharge.        Left eye: No discharge.  Neck:     Thyroid: No thyroid mass or thyromegaly.     Vascular: No carotid bruit.  Cardiovascular:     Rate and Rhythm: Normal rate. Rhythm irregular.     Pulses: Normal pulses.     Heart sounds: Normal heart sounds. No murmur heard.   Pulmonary:     Effort: Pulmonary effort is normal. No respiratory distress.     Breath sounds: Normal breath sounds. No wheezing.  Abdominal:     General: Bowel sounds are normal. There is no distension.     Palpations: Abdomen is soft.     Tenderness: There is no abdominal tenderness.  Musculoskeletal:     Cervical back: Normal range of motion.     Right lower leg: No edema.     Left lower leg: No edema.  Feet:     Right foot:     Toenail  Condition: Right toenails are abnormally thick. Fungal disease present.    Left foot:     Toenail Condition: Left toenails are abnormally thick. Fungal disease present. Lymphadenopathy:     Cervical: No cervical adenopathy.  Skin:    Capillary Refill: Capillary refill takes less than 2 seconds.     Comments: Skin tear to left shin, CDI, granulation tissue present, no drainage.   Neurological:     General: No focal deficit present.     Mental Status: She is alert. Mental status is at baseline.     Motor: Weakness present.     Gait: Gait abnormal.     Comments: Left sided weakness. PWC/cane  Psychiatric:        Mood and Affect: Mood normal.        Behavior: Behavior normal.     Labs reviewed: Recent Labs    08/17/20 0731 08/18/20 0255 10/12/20 0000 01/19/21 0000  NA 142 137 137 140  K 3.7 3.9 5.0 4.3  CL 105 106 101 97*  CO2 24 23 22  28*  GLUCOSE 112* 134*  --   --   BUN 24* 21 18 30*  CREATININE 0.89 1.01* 0.7 1.0  CALCIUM 9.3 8.7* 9.0 10.2   Recent Labs    08/17/20 0731 08/18/20 0255  AST 32 26  ALT 24 21  ALKPHOS 62 53  BILITOT 1.2 1.2  PROT 6.5 6.1*  ALBUMIN 3.7 3.4*   Recent Labs    08/17/20 0731 08/17/20 1322 08/18/20 0255 08/23/20 0521 08/23/20 1621 08/30/20 0000 10/12/20 0000  WBC 15.5* 12.7* 15.9* 16.9*  --  13.9 8.7  NEUTROABS 11.9* 11.1*  --   --   --  10.40  --   HGB 11.2* 9.7* 9.1* 7.3* 8.8* 9.6* 11.5*  HCT 35.0* 31.3* 29.2* 24.1* 27.9* 29* 36  MCV 92.3 93.4 94.5 97.6  --   --   --   PLT 84* 81* 81* 138*  --  197 105*   Lab Results  Component Value Date   TSH 3.80 09/14/2015   Lab Results  Component Value Date   HGBA1C 5.7 (H) 08/19/2020   Lab Results  Component Value Date   CHOL 166 07/22/2020   HDL 53 07/22/2020   LDLCALC 106 03/25/2019   LDLDIRECT 81.3 08/19/2020   TRIG 54 07/22/2020   CHOLHDL 2 09/12/2010    Significant Diagnostic Results in last 30 days:  CUP PACEART REMOTE DEVICE CHECK  Result Date:  03/04/2021 Scheduled remote reviewed. Normal device function.  RP Next remote 91 days.   Assessment/Plan 1. Seasonal allergies - runny nose with clear drainage - Claritin unsuccessful in past - zyrtec 10 mg po QHS x 14 days  2. Onychomycosis - left and right great toes thick, toenails with slight yellow color - would like to try tea tree oil- I have discussed limited effectiveness in treatment - tes tree oil- 1-2 drops to toenails bid x 2 months  3. 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 Lyndel Safe  4. Essential hypertension - bp not at goal of < 130/90 - cont atenolol 50 mg bid - will increase HCTZ to 50 mg po daily - recheck bmp in 1 week - cont hydralazine 10 mg po tid prn for SBP> 170  5. Permanent atrial fibrillation (HCC) - rate controlled with digoxin and atenolol - asa for clot prevention, poor candidate for DOAC  6. Atrioventricular block, complete (HCC) - s/p pacemaker  7. Oropharyngeal dysphagia - no recent aspirations, cont regular diet  8. Acute pain of right knee - will make Voltaren gel qid prn  - cont trmamdol 50 mg q6 prn  9. Slow transit constipation - stable with daily miralax - encourage hydration    Family/ staff Communication:   Labs/tests ordered:

## 2021-03-09 DIAGNOSIS — M6389 Disorders of muscle in diseases classified elsewhere, multiple sites: Secondary | ICD-10-CM | POA: Diagnosis not present

## 2021-03-09 DIAGNOSIS — R2689 Other abnormalities of gait and mobility: Secondary | ICD-10-CM | POA: Diagnosis not present

## 2021-03-09 DIAGNOSIS — I6389 Other cerebral infarction: Secondary | ICD-10-CM | POA: Diagnosis not present

## 2021-03-09 DIAGNOSIS — Z9181 History of falling: Secondary | ICD-10-CM | POA: Diagnosis not present

## 2021-03-09 DIAGNOSIS — M62562 Muscle wasting and atrophy, not elsewhere classified, left lower leg: Secondary | ICD-10-CM | POA: Diagnosis not present

## 2021-03-09 DIAGNOSIS — R278 Other lack of coordination: Secondary | ICD-10-CM | POA: Diagnosis not present

## 2021-03-09 DIAGNOSIS — M84350S Stress fracture, pelvis, sequela: Secondary | ICD-10-CM | POA: Diagnosis not present

## 2021-03-10 DIAGNOSIS — R278 Other lack of coordination: Secondary | ICD-10-CM | POA: Diagnosis not present

## 2021-03-10 DIAGNOSIS — I6389 Other cerebral infarction: Secondary | ICD-10-CM | POA: Diagnosis not present

## 2021-03-10 DIAGNOSIS — Z9181 History of falling: Secondary | ICD-10-CM | POA: Diagnosis not present

## 2021-03-10 DIAGNOSIS — M6389 Disorders of muscle in diseases classified elsewhere, multiple sites: Secondary | ICD-10-CM | POA: Diagnosis not present

## 2021-03-10 DIAGNOSIS — M62562 Muscle wasting and atrophy, not elsewhere classified, left lower leg: Secondary | ICD-10-CM | POA: Diagnosis not present

## 2021-03-10 DIAGNOSIS — M84350S Stress fracture, pelvis, sequela: Secondary | ICD-10-CM | POA: Diagnosis not present

## 2021-03-10 DIAGNOSIS — R2689 Other abnormalities of gait and mobility: Secondary | ICD-10-CM | POA: Diagnosis not present

## 2021-03-11 DIAGNOSIS — I6389 Other cerebral infarction: Secondary | ICD-10-CM | POA: Diagnosis not present

## 2021-03-11 DIAGNOSIS — Z9181 History of falling: Secondary | ICD-10-CM | POA: Diagnosis not present

## 2021-03-11 DIAGNOSIS — M62562 Muscle wasting and atrophy, not elsewhere classified, left lower leg: Secondary | ICD-10-CM | POA: Diagnosis not present

## 2021-03-11 DIAGNOSIS — M84350S Stress fracture, pelvis, sequela: Secondary | ICD-10-CM | POA: Diagnosis not present

## 2021-03-11 DIAGNOSIS — M6389 Disorders of muscle in diseases classified elsewhere, multiple sites: Secondary | ICD-10-CM | POA: Diagnosis not present

## 2021-03-11 DIAGNOSIS — R2689 Other abnormalities of gait and mobility: Secondary | ICD-10-CM | POA: Diagnosis not present

## 2021-03-11 DIAGNOSIS — R278 Other lack of coordination: Secondary | ICD-10-CM | POA: Diagnosis not present

## 2021-03-14 DIAGNOSIS — M84350S Stress fracture, pelvis, sequela: Secondary | ICD-10-CM | POA: Diagnosis not present

## 2021-03-14 DIAGNOSIS — I6389 Other cerebral infarction: Secondary | ICD-10-CM | POA: Diagnosis not present

## 2021-03-14 DIAGNOSIS — M62562 Muscle wasting and atrophy, not elsewhere classified, left lower leg: Secondary | ICD-10-CM | POA: Diagnosis not present

## 2021-03-14 DIAGNOSIS — Z9181 History of falling: Secondary | ICD-10-CM | POA: Diagnosis not present

## 2021-03-14 DIAGNOSIS — R278 Other lack of coordination: Secondary | ICD-10-CM | POA: Diagnosis not present

## 2021-03-14 DIAGNOSIS — R2689 Other abnormalities of gait and mobility: Secondary | ICD-10-CM | POA: Diagnosis not present

## 2021-03-15 ENCOUNTER — Other Ambulatory Visit: Payer: Self-pay | Admitting: Orthopedic Surgery

## 2021-03-15 DIAGNOSIS — Z9181 History of falling: Secondary | ICD-10-CM | POA: Diagnosis not present

## 2021-03-15 DIAGNOSIS — I10 Essential (primary) hypertension: Secondary | ICD-10-CM | POA: Diagnosis not present

## 2021-03-15 DIAGNOSIS — F419 Anxiety disorder, unspecified: Secondary | ICD-10-CM

## 2021-03-15 DIAGNOSIS — R2689 Other abnormalities of gait and mobility: Secondary | ICD-10-CM | POA: Diagnosis not present

## 2021-03-15 DIAGNOSIS — M6389 Disorders of muscle in diseases classified elsewhere, multiple sites: Secondary | ICD-10-CM | POA: Diagnosis not present

## 2021-03-15 DIAGNOSIS — M62562 Muscle wasting and atrophy, not elsewhere classified, left lower leg: Secondary | ICD-10-CM | POA: Diagnosis not present

## 2021-03-15 DIAGNOSIS — M84350S Stress fracture, pelvis, sequela: Secondary | ICD-10-CM | POA: Diagnosis not present

## 2021-03-15 DIAGNOSIS — I6389 Other cerebral infarction: Secondary | ICD-10-CM | POA: Diagnosis not present

## 2021-03-15 DIAGNOSIS — R278 Other lack of coordination: Secondary | ICD-10-CM | POA: Diagnosis not present

## 2021-03-15 LAB — BASIC METABOLIC PANEL
BUN: 26 — AB (ref 4–21)
CO2: 29 — AB (ref 13–22)
Chloride: 95 — AB (ref 99–108)
Creatinine: 0.9 (ref 0.5–1.1)
Glucose: 86
Potassium: 4 (ref 3.4–5.3)
Sodium: 134 — AB (ref 137–147)

## 2021-03-15 LAB — COMPREHENSIVE METABOLIC PANEL
Calcium: 9.2 (ref 8.7–10.7)
GFR calc Af Amer: 66.48
GFR calc non Af Amer: 57.36

## 2021-03-15 MED ORDER — LORAZEPAM 1 MG PO TABS: 1.0000 mg | ORAL_TABLET | Freq: Every day | ORAL | 5 refills | Status: DC

## 2021-03-16 DIAGNOSIS — M62562 Muscle wasting and atrophy, not elsewhere classified, left lower leg: Secondary | ICD-10-CM | POA: Diagnosis not present

## 2021-03-16 DIAGNOSIS — M84350S Stress fracture, pelvis, sequela: Secondary | ICD-10-CM | POA: Diagnosis not present

## 2021-03-16 DIAGNOSIS — R2689 Other abnormalities of gait and mobility: Secondary | ICD-10-CM | POA: Diagnosis not present

## 2021-03-16 DIAGNOSIS — R278 Other lack of coordination: Secondary | ICD-10-CM | POA: Diagnosis not present

## 2021-03-16 DIAGNOSIS — L600 Ingrowing nail: Secondary | ICD-10-CM | POA: Diagnosis not present

## 2021-03-16 DIAGNOSIS — I6389 Other cerebral infarction: Secondary | ICD-10-CM | POA: Diagnosis not present

## 2021-03-16 DIAGNOSIS — M6389 Disorders of muscle in diseases classified elsewhere, multiple sites: Secondary | ICD-10-CM | POA: Diagnosis not present

## 2021-03-16 DIAGNOSIS — Z9181 History of falling: Secondary | ICD-10-CM | POA: Diagnosis not present

## 2021-03-17 DIAGNOSIS — R278 Other lack of coordination: Secondary | ICD-10-CM | POA: Diagnosis not present

## 2021-03-17 DIAGNOSIS — I6389 Other cerebral infarction: Secondary | ICD-10-CM | POA: Diagnosis not present

## 2021-03-17 DIAGNOSIS — Z9181 History of falling: Secondary | ICD-10-CM | POA: Diagnosis not present

## 2021-03-17 DIAGNOSIS — M84350S Stress fracture, pelvis, sequela: Secondary | ICD-10-CM | POA: Diagnosis not present

## 2021-03-17 DIAGNOSIS — M6389 Disorders of muscle in diseases classified elsewhere, multiple sites: Secondary | ICD-10-CM | POA: Diagnosis not present

## 2021-03-18 DIAGNOSIS — M84350S Stress fracture, pelvis, sequela: Secondary | ICD-10-CM | POA: Diagnosis not present

## 2021-03-18 DIAGNOSIS — M6389 Disorders of muscle in diseases classified elsewhere, multiple sites: Secondary | ICD-10-CM | POA: Diagnosis not present

## 2021-03-18 DIAGNOSIS — R278 Other lack of coordination: Secondary | ICD-10-CM | POA: Diagnosis not present

## 2021-03-18 DIAGNOSIS — R2689 Other abnormalities of gait and mobility: Secondary | ICD-10-CM | POA: Diagnosis not present

## 2021-03-18 DIAGNOSIS — Z9181 History of falling: Secondary | ICD-10-CM | POA: Diagnosis not present

## 2021-03-18 DIAGNOSIS — I6389 Other cerebral infarction: Secondary | ICD-10-CM | POA: Diagnosis not present

## 2021-03-18 DIAGNOSIS — M62562 Muscle wasting and atrophy, not elsewhere classified, left lower leg: Secondary | ICD-10-CM | POA: Diagnosis not present

## 2021-03-21 ENCOUNTER — Encounter: Payer: Self-pay | Admitting: Adult Health

## 2021-03-21 ENCOUNTER — Non-Acute Institutional Stay (SKILLED_NURSING_FACILITY): Payer: Medicare Other | Admitting: Adult Health

## 2021-03-21 DIAGNOSIS — M62562 Muscle wasting and atrophy, not elsewhere classified, left lower leg: Secondary | ICD-10-CM | POA: Diagnosis not present

## 2021-03-21 DIAGNOSIS — I10 Essential (primary) hypertension: Secondary | ICD-10-CM

## 2021-03-21 DIAGNOSIS — M84350S Stress fracture, pelvis, sequela: Secondary | ICD-10-CM | POA: Diagnosis not present

## 2021-03-21 DIAGNOSIS — R531 Weakness: Secondary | ICD-10-CM

## 2021-03-21 DIAGNOSIS — Z9181 History of falling: Secondary | ICD-10-CM | POA: Diagnosis not present

## 2021-03-21 DIAGNOSIS — R278 Other lack of coordination: Secondary | ICD-10-CM | POA: Diagnosis not present

## 2021-03-21 DIAGNOSIS — I6389 Other cerebral infarction: Secondary | ICD-10-CM | POA: Diagnosis not present

## 2021-03-21 DIAGNOSIS — R2689 Other abnormalities of gait and mobility: Secondary | ICD-10-CM | POA: Diagnosis not present

## 2021-03-21 DIAGNOSIS — J302 Other seasonal allergic rhinitis: Secondary | ICD-10-CM

## 2021-03-21 DIAGNOSIS — M6389 Disorders of muscle in diseases classified elsewhere, multiple sites: Secondary | ICD-10-CM | POA: Diagnosis not present

## 2021-03-21 NOTE — Progress Notes (Signed)
Location:   [facility] Room Number: 118-A Place of Service:  SNF [id number]) Provider: Royal Hawthorn, NP    Patient Care Team: [name], MD as PCP - General (Internal Medicine) [name], MD as PCP - Electrophysiology (Cardiology) [name], MD as Consulting Physician (Dermatology) [name], MD as Consulting Physician (Cardiology) [facility] [name], MD as Consulting Physician (Dermatology)  Extended Emergency Contact Information Primary Emergency Contact: [name] of [place] Phone: [phone] Mobile Phone: [phone] Relation: Daughter Secondary Emergency Contact: [name],[name]  [name] of [place] Phone: [phone] Relation: Son  Code Status:  DNR Goals of care: Advanced Directive information Advanced Directives 03/21/2021  Does Patient Have a Medical Advance Directive? Yes  Type of Paramedic of [place];Living will;Out of facility DNR (pink MOST or yellow form)  Does patient want to make changes to medical advance directive? No - Patient declined  Copy of [place] in Chart? Yes - validated most recent copy scanned in chart (See row information)  Pre-existing out of facility DNR order (yellow form or pink MOST form) -     Chief Complaint  Patient presents with  . Acute Visit    Weakness.    HPI:  Pt is a 87 y.o. female seen today for an acute visit for weakness.  Ms. Warzecha resides in skilled care s/p CVA with left sided weakness. She has been walking short distances with assistance but for the past week she has been weaker and has needed a lift periodically. She denies feeling dizzy, and does not have sob, cp, body aches, dysuria, or other symptoms. She thinks she has been using her right arm more frequently for transfers since the left side is weak which may have led to her symptoms. One week ago she was  placed on zytec for seasonal allergies. Also her HCTZ was increased to 50 mg due to elevated bp with improvement noted. 135/85.      Past Medical History:  Diagnosis Date  . Actinic keratosis 05/25/2014  . Anxiety   . Atrial fibrillation ([place]) 09/21/2010  . Basal cell carcinoma 05/25/2014   Multiple removed by Dr. Danella Sensing in March 2015: right neck, left neck, scalp   . Bell's palsy 07/18/1979  . Cervicalgia 01/31/2012  . Closed fracture of lumbar vertebra without mention of spinal cord injury 07/17/2005  . Conjunctiva disorder 12/26/2010  . Coronary atherosclerosis of native coronary artery 07/17/1997  . Cramp of limb 08/16/2009  . Degeneration of lumbar or lumbosacral intervertebral disc 01/31/2012  . Disturbance of skin sensation 08/2009  . Dizziness and giddiness 11/08/2011   vertigo  . External hemorrhoids without mention of complication 5/46/2703  . External hemorrhoids without mention of complication 50/07/3817  . Ganglion of tendon sheath 08/16/2009  . Leg cramp 10/27/2013   Most frequently the right leg.   . Long term (current) use of anticoagulants 09/2010  . Lumbago 07/2009  . Meralgia paresthetica 07/2007  . MI, old   . Myalgia and myositis, unspecified 01/17/2012  . Osteoporosis   . Other abnormal blood chemistry 04/08/2012  . Other abnormal blood chemistry 2013   hyperglycemia  . Other disorder of muscle, ligament, and fascia 04/08/2012  . Other specified cardiac dysrhythmias(427.89) 06/27/2010  . Pacemaker 05/06/2020  . Pain in joint, ankle and foot 10/27/2013   Bilateral since 1998   . Pain in joint, shoulder region 08/12/2012  . Pain in joint, upper arm  12/26/2010  . Pain in limb 01/11/2011  . Pain in thoracic spine 01/31/2012  . 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 PVC's (premature ventricular contractions)   . Rash and other nonspecific skin eruption 08/02/2011  . Senile osteoporosis 07/17/1993  . Stroke ([place])   . Unspecified essential hypertension 07/17/1997   . Varicose veins of lower extremities 08/16/2009  . Varicose veins of lower extremities 08/16/2009   Past Surgical History:  Procedure Laterality Date  . COLONOSCOPY WITH PROPOFOL N/A 10/19/2015   Procedure: COLONOSCOPY WITH PROPOFOL;  Surgeon: Gatha Mayer, MD;  Location: [facility];  Service: Endoscopy;  Laterality: N/A;  . CORONARY ARTERY BYPASS GRAFT  1998   x2; LIMA to LAD; SVG to diagonal off bypass  . HEMORRHOID SURGERY  08/26/2012   Dr. Brantley Stage  . LOOP RECORDER INSERTION N/A 05/13/2018   Procedure: LOOP RECORDER INSERTION;  Surgeon: [name], MD;  Location: [facility];  Service: Cardiovascular;  Laterality: N/A;  . LOOP RECORDER REMOVAL N/A 05/26/2019   Procedure: LOOP RECORDER REMOVAL;  Surgeon: [name], MD;  Location: [facility];  Service: Cardiovascular;  Laterality: N/A;  . MASS EXCISION Left 11/23/2015   Procedure: LEFT WRIST EXCISION CYST;  Surgeon: Leanora Cover, MD;  Location: [facility];  Service: Orthopedics;  Laterality: Left;  . PACEMAKER IMPLANT N/A 05/26/2019   Procedure: PACEMAKER IMPLANT;  Surgeon: [name], MD;  Location: [facility];  Service: Cardiovascular;  Laterality: N/A;  . SKIN BIOPSY  01/29/14   (R) neck; (R) scalp, 2 (L) neck; shave biopsy Superficial basal cell carcinoma Dr. Danella Sensing  . TONSILLECTOMY  1940's    Allergies  Allergen Reactions  . Iodinated Diagnostic Agents Nausea Only and Other (See Comments)    Severe nausea and also passed out  . Iodine   . Other     Seasonal Allergies.  . Bactrim Rash  . Relafen [Nabumetone] Rash  . Sulfanilamide Rash    Allergies as of 03/21/2021      Reactions   Iodinated Diagnostic Agents Nausea Only, Other (See Comments)   Severe nausea and also passed out   Iodine    Other    Seasonal Allergies.   Bactrim Rash   Relafen [nabumetone] Rash   Sulfanilamide Rash      Medication List       Accurate as of Mar 21, 2021 10:11 AM. If you  have any questions, ask your nurse or doctor.        acetaminophen 500 MG tablet Commonly known as: TYLENOL Take 1,000 mg by mouth every 6 (six) hours as needed for mild pain (pain.).   aspirin 81 MG EC tablet Take 1 tablet (81 mg total) by mouth daily. Swallow whole.   atenolol 50 MG tablet Commonly known as: TENORMIN Take 50 mg by mouth 2 (two) times daily.   calcium-vitamin D 500-200 MG-UNIT tablet Commonly known as: OSCAL WITH D Take 1 tablet by mouth daily.   cetirizine 10 MG tablet Commonly known as: ZYRTEC Take 10 mg by mouth at bedtime. 9pm   diclofenac Sodium 1 % Gel Commonly known as: VOLTAREN Apply 4 g topically 4 (four) times daily as needed. Left knee, right knee, left ankle   digoxin 0.125 MG tablet Commonly known as: LANOXIN Take 0.125 mg by mouth every other day.   hydrALAZINE 10 MG tablet Commonly known as: APRESOLINE Take 10 mg by mouth 3 (three) times daily as needed. TID PRN  for SBP >170   hydrochlorothiazide 25 MG tablet Commonly known as: HYDRODIURIL Take 25 mg by mouth daily.   LORazepam 1 MG tablet Commonly known as: ATIVAN Take 1 tablet (1 mg total) by mouth at bedtime.   ondansetron 4 MG tablet Commonly known as: ZOFRAN Take 1 tablet (4 mg total) by mouth every 6 (six) hours as needed for nausea.   polyethylene glycol 17 g packet Commonly known as: MIRALAX / GLYCOLAX Take 17 g by mouth every other day.   Tea Tree Oil Oil Apply 2 drops topically in the morning and at bedtime. Apply 1-2 gtts to affected toes with fungus ( left great and second) twice daily for 2 months.   traMADol 50 MG tablet Commonly known as: ULTRAM Take 1 tablet (50 mg total) by mouth every 6 (six) hours as needed for moderate pain or severe pain.       Review of Systems  Constitutional: Positive for activity change. Negative for appetite change, chills, diaphoresis, fatigue, fever and unexpected weight change.  HENT: Negative for congestion.   Respiratory:  Negative for cough, shortness of breath and wheezing.   Cardiovascular: Negative for chest pain, palpitations and leg swelling.  Gastrointestinal: Negative for abdominal distention, abdominal pain, constipation and diarrhea.  Genitourinary: Negative for difficulty urinating and dysuria.  Musculoskeletal: Positive for gait problem. Negative for arthralgias, back pain, joint swelling and myalgias.  Neurological: Positive for speech difficulty and weakness. Negative for dizziness, tremors, seizures, syncope, facial asymmetry, light-headedness, numbness and headaches.  Psychiatric/Behavioral: Negative for agitation, behavioral problems and confusion.    Immunization History  Administered Date(s) Administered  . Influenza Whole 08/06/2012  . Influenza, High Dose Seasonal PF 08/15/2019, 08/27/2020  . Influenza,inj,Quad PF,6+ Mos 08/30/2018  . Influenza-Unspecified 08/06/2013, 08/24/2014, 08/26/2015, 08/31/2016, 08/27/2017, 08/27/2020  . Moderna Sars-Covid-2 Vaccination 11/16/2019, 12/16/2019, 09/16/2020  . Pneumococcal Conjugate-13 09/22/2015  . Pneumococcal Polysaccharide-23 08/29/2006  . Td 10/07/2007  . Zoster 03/12/2008  . Zoster Recombinat (Shingrix) 02/12/2018, 05/13/2018   Pertinent  Health Maintenance Due  Topic Date Due  . INFLUENZA VACCINE  06/06/2021  . DEXA SCAN  Completed  . PNA vac Low Risk Adult  Completed   Fall Risk  09/23/2020 08/26/2020 07/28/2020 02/25/2020 01/28/2020  Falls in the past year? 1 1 0 0 0  Number falls in past yr: 0 0 1 0 0  Comment - - - - -  Injury with Fall? 1 1 0 0 0  Comment Patient fractured pelvis and had a stroke - - - -  Risk for fall due to : - History of fall(s);Impaired balance/gait;Impaired mobility;Orthopedic patient - - -  Follow up - Falls evaluation completed;Education provided;Falls prevention discussed;Follow up appointment - - -   Functional Status Survey:    Vitals:   03/21/21 1002  BP: 135/85  Pulse: 88  Temp: 98.1 F (36.7  C)  SpO2: 98%  Weight: 128 lb (58.1 kg)  Height: 5\' 6"  (1.676 m)   Body mass index is 20.66 kg/m. Physical Exam Vitals and nursing note reviewed.  Constitutional:      General: She is not in acute distress.    Appearance: She is not diaphoretic.  HENT:     Head: Normocephalic and atraumatic.     Mouth/Throat:     Mouth: Mucous membranes are moist.     Pharynx: Oropharynx is clear.  Eyes:     Extraocular Movements: Extraocular movements intact.     Conjunctiva/sclera: Conjunctivae normal.     Pupils: Pupils are equal,  round, and reactive to light.  Neck:     Vascular: No JVD.  Cardiovascular:     Rate and Rhythm: Normal rate and regular rhythm.     Heart sounds: No murmur heard.   Pulmonary:     Effort: Pulmonary effort is normal. No respiratory distress.     Breath sounds: Normal breath sounds. No wheezing.  Abdominal:     General: Bowel sounds are normal.     Palpations: Abdomen is soft.  Musculoskeletal:     Cervical back: No rigidity or tenderness.     Right lower leg: No edema.     Left lower leg: No edema.  Lymphadenopathy:     Cervical: No cervical adenopathy.  Skin:    General: Skin is warm and dry.     Comments: Hematoma and surrounding bruising to the left shin area and a small skin tear above the hematoma   Neurological:     Mental Status: She is alert and oriented to person, place, and time.     Comments: Dysarthria, slight left facial droop. Left arm and leg weakness 3/5 unchanged from baseline   Psychiatric:        Mood and Affect: Mood normal.     Labs reviewed: Recent Labs    08/17/20 0731 08/18/20 0255 10/12/20 0000 01/19/21 0000 03/15/21 0000  NA 142 137 137 140 134*  K 3.7 3.9 5.0 4.3 4.0  CL 105 106 101 97* 95*  CO2 24 23 22  28* 29*  GLUCOSE 112* 134*  --   --   --   BUN 24* 21 18 30* 26*  CREATININE 0.89 1.01* 0.7 1.0 0.9  CALCIUM 9.3 8.7* 9.0 10.2 9.2   Recent Labs    08/17/20 0731 08/18/20 0255  AST 32 26  ALT 24 21   ALKPHOS 62 53  BILITOT 1.2 1.2  PROT 6.5 6.1*  ALBUMIN 3.7 3.4*   Recent Labs    08/17/20 0731 08/17/20 1322 08/18/20 0255 08/23/20 0521 08/23/20 1621 08/30/20 0000 10/12/20 0000  WBC 15.5* 12.7* 15.9* 16.9*  --  13.9 8.7  NEUTROABS 11.9* 11.1*  --   --   --  10.40  --   HGB 11.2* 9.7* 9.1* 7.3* 8.8* 9.6* 11.5*  HCT 35.0* 31.3* 29.2* 24.1* 27.9* 29* 36  MCV 92.3 93.4 94.5 97.6  --   --   --   PLT 84* 81* 81* 138*  --  197 105*   Lab Results  Component Value Date   TSH 3.80 09/14/2015   Lab Results  Component Value Date   HGBA1C 5.7 (H) 08/19/2020   Lab Results  Component Value Date   CHOL 166 07/22/2020   HDL 53 07/22/2020   LDLCALC 106 03/25/2019   LDLDIRECT 81.3 08/19/2020   TRIG 54 07/22/2020   CHOLHDL 2 09/12/2010    Significant Diagnostic Results in last 30 days:  CUP PACEART REMOTE DEVICE CHECK  Result Date: 03/04/2021 Scheduled remote reviewed. Normal device function.  RP Next remote 91 days.   Assessment/Plan  1. Weakness Mild and noticed during transfers Will check labs   2. Seasonal allergies Will d/c zyrtec and monitor for improvement   3. Essential hypertension Improved with no low readings.   Continue hctz 50 mg   Family/ staff Communication: discussed with her nurse Misti  Labs/tests ordered:  CBC and TSH in the am

## 2021-03-22 DIAGNOSIS — I6389 Other cerebral infarction: Secondary | ICD-10-CM | POA: Diagnosis not present

## 2021-03-22 DIAGNOSIS — M84350S Stress fracture, pelvis, sequela: Secondary | ICD-10-CM | POA: Diagnosis not present

## 2021-03-22 DIAGNOSIS — R278 Other lack of coordination: Secondary | ICD-10-CM | POA: Diagnosis not present

## 2021-03-22 DIAGNOSIS — Z9181 History of falling: Secondary | ICD-10-CM | POA: Diagnosis not present

## 2021-03-22 DIAGNOSIS — M62562 Muscle wasting and atrophy, not elsewhere classified, left lower leg: Secondary | ICD-10-CM | POA: Diagnosis not present

## 2021-03-22 DIAGNOSIS — R2689 Other abnormalities of gait and mobility: Secondary | ICD-10-CM | POA: Diagnosis not present

## 2021-03-22 DIAGNOSIS — Z79899 Other long term (current) drug therapy: Secondary | ICD-10-CM | POA: Diagnosis not present

## 2021-03-22 DIAGNOSIS — M6389 Disorders of muscle in diseases classified elsewhere, multiple sites: Secondary | ICD-10-CM | POA: Diagnosis not present

## 2021-03-22 LAB — CBC AND DIFFERENTIAL
HCT: 40 (ref 36–46)
Hemoglobin: 13.4 (ref 12.0–16.0)
Platelets: 126 — AB (ref 150–399)
WBC: 5.4

## 2021-03-22 LAB — CBC: RBC: 4.53 (ref 3.87–5.11)

## 2021-03-22 LAB — TSH: TSH: 2.48 (ref 0.41–5.90)

## 2021-03-23 DIAGNOSIS — I6389 Other cerebral infarction: Secondary | ICD-10-CM | POA: Diagnosis not present

## 2021-03-23 DIAGNOSIS — M84350S Stress fracture, pelvis, sequela: Secondary | ICD-10-CM | POA: Diagnosis not present

## 2021-03-23 DIAGNOSIS — R2689 Other abnormalities of gait and mobility: Secondary | ICD-10-CM | POA: Diagnosis not present

## 2021-03-23 DIAGNOSIS — Z9181 History of falling: Secondary | ICD-10-CM | POA: Diagnosis not present

## 2021-03-23 DIAGNOSIS — M62562 Muscle wasting and atrophy, not elsewhere classified, left lower leg: Secondary | ICD-10-CM | POA: Diagnosis not present

## 2021-03-23 DIAGNOSIS — M6389 Disorders of muscle in diseases classified elsewhere, multiple sites: Secondary | ICD-10-CM | POA: Diagnosis not present

## 2021-03-23 DIAGNOSIS — R278 Other lack of coordination: Secondary | ICD-10-CM | POA: Diagnosis not present

## 2021-03-24 ENCOUNTER — Telehealth: Payer: Self-pay | Admitting: *Deleted

## 2021-03-24 DIAGNOSIS — M84350S Stress fracture, pelvis, sequela: Secondary | ICD-10-CM | POA: Diagnosis not present

## 2021-03-24 DIAGNOSIS — R2689 Other abnormalities of gait and mobility: Secondary | ICD-10-CM | POA: Diagnosis not present

## 2021-03-24 DIAGNOSIS — M62562 Muscle wasting and atrophy, not elsewhere classified, left lower leg: Secondary | ICD-10-CM | POA: Diagnosis not present

## 2021-03-24 DIAGNOSIS — I6389 Other cerebral infarction: Secondary | ICD-10-CM | POA: Diagnosis not present

## 2021-03-24 DIAGNOSIS — R278 Other lack of coordination: Secondary | ICD-10-CM | POA: Diagnosis not present

## 2021-03-24 DIAGNOSIS — Z9181 History of falling: Secondary | ICD-10-CM | POA: Diagnosis not present

## 2021-03-24 NOTE — Progress Notes (Signed)
Remote pacemaker transmission.   

## 2021-03-24 NOTE — Telephone Encounter (Signed)
Susan Hawthorn, NP  You 23 minutes ago (2:59 PM)   Labs were within normal limits. She Susan Conway have been a little drowsy with zyrtec which was discontinued.   Thanks   Message text      Patient daughter notified and agreed.

## 2021-03-24 NOTE — Telephone Encounter (Signed)
Patient daughter, Maryjean Ka, called requesting the results of patient's labwork she had done recently.  Stated that she hasn't heard anything and wants to know the results.   Please Advise.

## 2021-03-25 DIAGNOSIS — Z9181 History of falling: Secondary | ICD-10-CM | POA: Diagnosis not present

## 2021-03-25 DIAGNOSIS — R278 Other lack of coordination: Secondary | ICD-10-CM | POA: Diagnosis not present

## 2021-03-25 DIAGNOSIS — M84350S Stress fracture, pelvis, sequela: Secondary | ICD-10-CM | POA: Diagnosis not present

## 2021-03-25 DIAGNOSIS — I6389 Other cerebral infarction: Secondary | ICD-10-CM | POA: Diagnosis not present

## 2021-03-25 DIAGNOSIS — M62562 Muscle wasting and atrophy, not elsewhere classified, left lower leg: Secondary | ICD-10-CM | POA: Diagnosis not present

## 2021-03-25 DIAGNOSIS — R2689 Other abnormalities of gait and mobility: Secondary | ICD-10-CM | POA: Diagnosis not present

## 2021-03-28 DIAGNOSIS — M84350S Stress fracture, pelvis, sequela: Secondary | ICD-10-CM | POA: Diagnosis not present

## 2021-03-28 DIAGNOSIS — I6389 Other cerebral infarction: Secondary | ICD-10-CM | POA: Diagnosis not present

## 2021-03-28 DIAGNOSIS — Z9181 History of falling: Secondary | ICD-10-CM | POA: Diagnosis not present

## 2021-03-28 DIAGNOSIS — R278 Other lack of coordination: Secondary | ICD-10-CM | POA: Diagnosis not present

## 2021-03-28 DIAGNOSIS — M62562 Muscle wasting and atrophy, not elsewhere classified, left lower leg: Secondary | ICD-10-CM | POA: Diagnosis not present

## 2021-03-28 DIAGNOSIS — R2689 Other abnormalities of gait and mobility: Secondary | ICD-10-CM | POA: Diagnosis not present

## 2021-03-29 DIAGNOSIS — M62562 Muscle wasting and atrophy, not elsewhere classified, left lower leg: Secondary | ICD-10-CM | POA: Diagnosis not present

## 2021-03-29 DIAGNOSIS — R2689 Other abnormalities of gait and mobility: Secondary | ICD-10-CM | POA: Diagnosis not present

## 2021-03-29 DIAGNOSIS — Z9181 History of falling: Secondary | ICD-10-CM | POA: Diagnosis not present

## 2021-03-29 DIAGNOSIS — R278 Other lack of coordination: Secondary | ICD-10-CM | POA: Diagnosis not present

## 2021-03-29 DIAGNOSIS — M84350S Stress fracture, pelvis, sequela: Secondary | ICD-10-CM | POA: Diagnosis not present

## 2021-03-29 DIAGNOSIS — I6389 Other cerebral infarction: Secondary | ICD-10-CM | POA: Diagnosis not present

## 2021-03-30 DIAGNOSIS — Z9181 History of falling: Secondary | ICD-10-CM | POA: Diagnosis not present

## 2021-03-30 DIAGNOSIS — M84350S Stress fracture, pelvis, sequela: Secondary | ICD-10-CM | POA: Diagnosis not present

## 2021-03-30 DIAGNOSIS — R278 Other lack of coordination: Secondary | ICD-10-CM | POA: Diagnosis not present

## 2021-03-30 DIAGNOSIS — I6389 Other cerebral infarction: Secondary | ICD-10-CM | POA: Diagnosis not present

## 2021-03-30 DIAGNOSIS — M62562 Muscle wasting and atrophy, not elsewhere classified, left lower leg: Secondary | ICD-10-CM | POA: Diagnosis not present

## 2021-03-30 DIAGNOSIS — R2689 Other abnormalities of gait and mobility: Secondary | ICD-10-CM | POA: Diagnosis not present

## 2021-03-31 DIAGNOSIS — R2689 Other abnormalities of gait and mobility: Secondary | ICD-10-CM | POA: Diagnosis not present

## 2021-03-31 DIAGNOSIS — Z9181 History of falling: Secondary | ICD-10-CM | POA: Diagnosis not present

## 2021-03-31 DIAGNOSIS — M84350S Stress fracture, pelvis, sequela: Secondary | ICD-10-CM | POA: Diagnosis not present

## 2021-03-31 DIAGNOSIS — M62562 Muscle wasting and atrophy, not elsewhere classified, left lower leg: Secondary | ICD-10-CM | POA: Diagnosis not present

## 2021-03-31 DIAGNOSIS — R278 Other lack of coordination: Secondary | ICD-10-CM | POA: Diagnosis not present

## 2021-03-31 DIAGNOSIS — I6389 Other cerebral infarction: Secondary | ICD-10-CM | POA: Diagnosis not present

## 2021-04-01 DIAGNOSIS — R278 Other lack of coordination: Secondary | ICD-10-CM | POA: Diagnosis not present

## 2021-04-01 DIAGNOSIS — I6389 Other cerebral infarction: Secondary | ICD-10-CM | POA: Diagnosis not present

## 2021-04-01 DIAGNOSIS — Z9181 History of falling: Secondary | ICD-10-CM | POA: Diagnosis not present

## 2021-04-01 DIAGNOSIS — M62562 Muscle wasting and atrophy, not elsewhere classified, left lower leg: Secondary | ICD-10-CM | POA: Diagnosis not present

## 2021-04-01 DIAGNOSIS — R2689 Other abnormalities of gait and mobility: Secondary | ICD-10-CM | POA: Diagnosis not present

## 2021-04-01 DIAGNOSIS — M84350S Stress fracture, pelvis, sequela: Secondary | ICD-10-CM | POA: Diagnosis not present

## 2021-04-05 DIAGNOSIS — R278 Other lack of coordination: Secondary | ICD-10-CM | POA: Diagnosis not present

## 2021-04-05 DIAGNOSIS — M62562 Muscle wasting and atrophy, not elsewhere classified, left lower leg: Secondary | ICD-10-CM | POA: Diagnosis not present

## 2021-04-05 DIAGNOSIS — I6389 Other cerebral infarction: Secondary | ICD-10-CM | POA: Diagnosis not present

## 2021-04-05 DIAGNOSIS — M84350S Stress fracture, pelvis, sequela: Secondary | ICD-10-CM | POA: Diagnosis not present

## 2021-04-05 DIAGNOSIS — Z9181 History of falling: Secondary | ICD-10-CM | POA: Diagnosis not present

## 2021-04-05 DIAGNOSIS — R2689 Other abnormalities of gait and mobility: Secondary | ICD-10-CM | POA: Diagnosis not present

## 2021-04-11 DIAGNOSIS — M62562 Muscle wasting and atrophy, not elsewhere classified, left lower leg: Secondary | ICD-10-CM | POA: Diagnosis not present

## 2021-04-11 DIAGNOSIS — R2689 Other abnormalities of gait and mobility: Secondary | ICD-10-CM | POA: Diagnosis not present

## 2021-04-11 DIAGNOSIS — Z9181 History of falling: Secondary | ICD-10-CM | POA: Diagnosis not present

## 2021-04-11 DIAGNOSIS — I6389 Other cerebral infarction: Secondary | ICD-10-CM | POA: Diagnosis not present

## 2021-04-11 DIAGNOSIS — R278 Other lack of coordination: Secondary | ICD-10-CM | POA: Diagnosis not present

## 2021-04-11 DIAGNOSIS — M84350S Stress fracture, pelvis, sequela: Secondary | ICD-10-CM | POA: Diagnosis not present

## 2021-04-12 ENCOUNTER — Encounter: Payer: Self-pay | Admitting: Orthopedic Surgery

## 2021-04-12 ENCOUNTER — Non-Acute Institutional Stay (SKILLED_NURSING_FACILITY): Payer: Medicare Other | Admitting: Orthopedic Surgery

## 2021-04-12 DIAGNOSIS — I69354 Hemiplegia and hemiparesis following cerebral infarction affecting left non-dominant side: Secondary | ICD-10-CM | POA: Diagnosis not present

## 2021-04-12 DIAGNOSIS — K5901 Slow transit constipation: Secondary | ICD-10-CM

## 2021-04-12 DIAGNOSIS — I4821 Permanent atrial fibrillation: Secondary | ICD-10-CM

## 2021-04-12 DIAGNOSIS — B351 Tinea unguium: Secondary | ICD-10-CM | POA: Diagnosis not present

## 2021-04-12 DIAGNOSIS — I442 Atrioventricular block, complete: Secondary | ICD-10-CM

## 2021-04-12 DIAGNOSIS — M25561 Pain in right knee: Secondary | ICD-10-CM | POA: Diagnosis not present

## 2021-04-12 DIAGNOSIS — I10 Essential (primary) hypertension: Secondary | ICD-10-CM

## 2021-04-12 DIAGNOSIS — R1312 Dysphagia, oropharyngeal phase: Secondary | ICD-10-CM | POA: Diagnosis not present

## 2021-04-12 NOTE — Progress Notes (Signed)
Location:    [facility] Room Number: [id number] Place of Service:  SNF ([phone]) Provider:  Windell Moulding, NP  [name], MD  Patient Care Team: [name], MD as PCP - General (Internal Medicine) [name], MD as PCP - Electrophysiology (Cardiology) [name], MD as Consulting Physician (Dermatology) [name], MD as Consulting Physician (Cardiology) [name], MD as Consulting Physician (Dermatology)  Extended Emergency Contact Information Primary Emergency Contact: [name] of [place] Phone: [phone] Mobile Phone: [phone] Relation: Daughter Secondary Emergency Contact: [name],[name]  [name] of [place] Phone: [phone] Relation: Son  Code Status:  DNR Goals of care: Advanced Directive information Advanced Directives 04/12/2021  Does Patient Have a Medical Advance Directive? Yes  Type of Paramedic of [place];Living will;Out of facility DNR (pink MOST or yellow form)  Does patient want to make changes to medical advance directive? No - Patient declined  Copy of [place] in Chart? Yes - validated most recent copy scanned in chart (See row information)  Pre-existing out of facility DNR order (yellow form or pink MOST form) -     Chief Complaint  Patient presents with  . Medical Management of Chronic Issues  . Health Maintenance    PCV13,TDAP, #4 covid     HPI:  Pt is a 87 y.o. female seen today for medical management of chronic diseases.    She currently resides on the skilled nursing unit at [facility]. Past medical history includes: atrial fibrillation, AV block, CAD, hypertension, GERD, stroke, left sided paralysis, constipation, and dysphagia.   Weakness, improved after zyrtec discontinued. PT discontinued. Ambulates short distances. She plans to have walking sessions with daughter a few  times a week. Uses motorized wheelchair most of time. No recent falls or injuries. TSH 2.48 03/22/2021. Onychomcosis, tea tree oil applied to toes bid.  Left sided weakness, stroke 08/2020. Brace applied to left hand to prevent contracture.  Left knee pain, reports of knee feeling cold, pain minimal, uses prn voltaren gel, tylenol, and tramadol.  Hypertension, uncontrolled today and elevated bp last month, HCTZ increased to 50 mg daily, atenolol 50 mg bid, prn hydralizaline AV block, s/p pacemaker Atrial fibrillation, atenolol 50 mg bid and aspirin 81 mg daily, no anticoagulation due to prior bleeding incidents Oropharyngeal dysphagia, no recent aspirations Constipation, miralax daily   Reports daughter scheduling appointment with dental surgeon regarding cracked crown. She denies dental pain.   Recent blood pressures:  06/05- 148/82  06/04- 144/80  06/02- 160/78  06/01- 160/90  Recent weights:  06/01- 126.6 lbs  04/01- 128 lbs  03/01- 129.6 lbs  Nurse does not report any concerns, vitals stable.    Past Medical History:  Diagnosis Date  . Actinic keratosis 05/25/2014  . Anxiety   . Atrial fibrillation ([place]) 09/21/2010  . Basal cell carcinoma 05/25/2014   Multiple removed by Dr. Danella Sensing in March 2015: right neck, left neck, scalp   . Bell's palsy 07/18/1979  . Cervicalgia 01/31/2012  . Closed fracture of lumbar vertebra without mention of spinal cord injury 07/17/2005  . Conjunctiva disorder 12/26/2010  . Coronary atherosclerosis of native coronary artery 07/17/1997  . Cramp of limb 08/16/2009  . Degeneration of lumbar or lumbosacral intervertebral disc 01/31/2012  . Disturbance of skin sensation 08/2009  . Dizziness and giddiness 11/08/2011   vertigo  . External hemorrhoids without mention of complication 9/48/5462  . External hemorrhoids  without mention of complication 29/92/4268  . Ganglion of tendon sheath 08/16/2009  . Leg cramp 10/27/2013   Most frequently the right  leg.   . Long term (current) use of anticoagulants 09/2010  . Lumbago 07/2009  . Meralgia paresthetica 07/2007  . MI, old   . Myalgia and myositis, unspecified 01/17/2012  . Osteoporosis   . Other abnormal blood chemistry 04/08/2012  . Other abnormal blood chemistry 2013   hyperglycemia  . Other disorder of muscle, ligament, and fascia 04/08/2012  . Other specified cardiac dysrhythmias(427.89) 06/27/2010  . Pacemaker 05/06/2020  . Pain in joint, ankle and foot 10/27/2013   Bilateral since 1998   . Pain in joint, shoulder region 08/12/2012  . Pain in joint, upper arm 12/26/2010  . Pain in limb 01/11/2011  . Pain in thoracic spine 01/31/2012  . 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 PVC's (premature ventricular contractions)   . Rash and other nonspecific skin eruption 08/02/2011  . Senile osteoporosis 07/17/1993  . Stroke ([employer])   . Unspecified essential hypertension 07/17/1997  . Varicose veins of lower extremities 08/16/2009  . Varicose veins of lower extremities 08/16/2009   Past Surgical History:  Procedure Laterality Date  . COLONOSCOPY WITH PROPOFOL N/A 10/19/2015   Procedure: COLONOSCOPY WITH PROPOFOL;  Surgeon: Gatha Mayer, MD;  Location: [facility];  Service: Endoscopy;  Laterality: N/A;  . CORONARY ARTERY BYPASS GRAFT  1998   x2; LIMA to LAD; SVG to diagonal off bypass  . HEMORRHOID SURGERY  08/26/2012   Dr. Brantley Stage  . LOOP RECORDER INSERTION N/A 05/13/2018   Procedure: LOOP RECORDER INSERTION;  Surgeon: [name], MD;  Location: [facility];  Service: Cardiovascular;  Laterality: N/A;  . LOOP RECORDER REMOVAL N/A 05/26/2019   Procedure: LOOP RECORDER REMOVAL;  Surgeon: [name], MD;  Location: [facility];  Service: Cardiovascular;  Laterality: N/A;  . MASS EXCISION Left 11/23/2015   Procedure: LEFT WRIST EXCISION CYST;  Surgeon: Leanora Cover, MD;  Location: [facility];  Service: Orthopedics;  Laterality: Left;  . PACEMAKER IMPLANT  N/A 05/26/2019   Procedure: PACEMAKER IMPLANT;  Surgeon: [name], MD;  Location: [facility];  Service: Cardiovascular;  Laterality: N/A;  . SKIN BIOPSY  01/29/14   (R) neck; (R) scalp, 2 (L) neck; shave biopsy Superficial basal cell carcinoma Dr. Danella Sensing  . TONSILLECTOMY  1940's    Allergies  Allergen Reactions  . Iodinated Diagnostic Agents Nausea Only and Other (See Comments)    Severe nausea and also passed out  . Iodine   . Other     Seasonal Allergies.  . Bactrim Rash  . Relafen [Nabumetone] Rash  . Sulfanilamide Rash    Allergies as of 04/12/2021      Reactions   Iodinated Diagnostic Agents Nausea Only, Other (See Comments)   Severe nausea and also passed out   Iodine    Other    Seasonal Allergies.   Bactrim Rash   Relafen [nabumetone] Rash   Sulfanilamide Rash      Medication List       Accurate as of April 12, 2021 11:20 AM. If you have any questions, ask your nurse or doctor.        acetaminophen 500 MG tablet Commonly known as: TYLENOL Take 1,000 mg by mouth every 6 (six) hours as needed for mild pain (pain.).   aspirin 81 MG EC tablet Take 1 tablet (81 mg total) by mouth  daily. Swallow whole.   atenolol 50 MG tablet Commonly known as: TENORMIN Take 50 mg by mouth 2 (two) times daily.   calcium-vitamin D 500-200 MG-UNIT tablet Commonly known as: OSCAL WITH D Take 1 tablet by mouth daily.   diclofenac Sodium 1 % Gel Commonly known as: VOLTAREN Apply 4 g topically 4 (four) times daily as needed. Left knee, right knee, left ankle   digoxin 0.125 MG tablet Commonly known as: LANOXIN Take 0.125 mg by mouth every other day.   hydrALAZINE 10 MG tablet Commonly known as: APRESOLINE Take 10 mg by mouth 3 (three) times daily as needed. TID PRN for SBP >170   hydrochlorothiazide 25 MG tablet Commonly known as: HYDRODIURIL Take 50 mg by mouth daily.   LORazepam 1 MG tablet Commonly known as: ATIVAN Take 1 tablet (1 mg total) by  mouth at bedtime.   ondansetron 4 MG tablet Commonly known as: ZOFRAN Take 1 tablet (4 mg total) by mouth every 6 (six) hours as needed for nausea.   polyethylene glycol 17 g packet Commonly known as: MIRALAX / GLYCOLAX Take 17 g by mouth every other day.   Tea Tree Oil Oil Apply 2 drops topically in the morning and at bedtime. Apply 1-2 gtts to affected toes with fungus ( left great and second) twice daily for 2 months.   traMADol 50 MG tablet Commonly known as: ULTRAM Take 1 tablet (50 mg total) by mouth every 6 (six) hours as needed for moderate pain or severe pain.       Review of Systems  Constitutional: Negative for activity change, appetite change, fatigue and fever.  HENT: Positive for dental problem and hearing loss. Negative for congestion and trouble swallowing.        Cracked crown, hearing aids  Eyes: Negative for visual disturbance.       Glasses  Respiratory: Negative for cough, shortness of breath and wheezing.   Cardiovascular: Negative for chest pain and leg swelling.  Gastrointestinal: Positive for constipation. Negative for abdominal distention, abdominal pain, diarrhea and nausea.  Genitourinary: Negative for dysuria, frequency and hematuria.  Musculoskeletal: Positive for arthralgias, gait problem and myalgias.       Right shoulder pain, right knee pain  Skin: Negative.   Neurological: Positive for weakness. Negative for dizziness and headaches.       Left sided weakness  Psychiatric/Behavioral: Negative for confusion, dysphoric mood and sleep disturbance. The patient is not nervous/anxious.     Immunization History  Administered Date(s) Administered  . Influenza Whole 08/06/2012  . Influenza, High Dose Seasonal PF 08/15/2019, 08/27/2020  . Influenza,inj,Quad PF,6+ Mos 08/30/2018  . Influenza-Unspecified 08/06/2013, 08/24/2014, 08/26/2015, 08/31/2016, 08/27/2017, 08/27/2020  . Moderna Sars-Covid-2 Vaccination 11/16/2019, 12/16/2019, 09/16/2020  .  Pneumococcal Conjugate-13 09/22/2015  . Pneumococcal Polysaccharide-23 08/29/2006  . Td 10/07/2007  . Zoster Recombinat (Shingrix) 02/12/2018, 05/13/2018  . Zoster, Live 03/12/2008   Pertinent  Health Maintenance Due  Topic Date Due  . INFLUENZA VACCINE  06/06/2021  . DEXA SCAN  Completed  . PNA vac Low Risk Adult  Completed   Fall Risk  09/23/2020 08/26/2020 07/28/2020 02/25/2020 01/28/2020  Falls in the past year? 1 1 0 0 0  Number falls in past yr: 0 0 1 0 0  Comment - - - - -  Injury with Fall? 1 1 0 0 0  Comment Patient fractured pelvis and had a stroke - - - -  Risk for fall due to : - History of fall(s);Impaired balance/gait;Impaired mobility;Orthopedic  patient - - -  Follow up - Falls evaluation completed;Education provided;Falls prevention discussed;Follow up appointment - - -   Functional Status Survey:    Vitals:   04/12/21 1113  BP: (!) 148/82  Pulse: 79  Resp: 16  Temp: (!) 97 F (36.1 C)  SpO2: 95%  Weight: 126 lb 9.6 oz (57.4 kg)  Height: 5\' 6"  (1.676 m)   Body mass index is 20.43 kg/m. Physical Exam Vitals reviewed.  Constitutional:      General: She is not in acute distress. HENT:     Head: Normocephalic.     Right Ear: There is no impacted cerumen.     Left Ear: There is no impacted cerumen.     Mouth/Throat:     Mouth: Mucous membranes are moist.  Eyes:     General:        Right eye: No discharge.        Left eye: No discharge.     Pupils: Pupils are equal, round, and reactive to light.  Neck:     Vascular: No carotid bruit.  Cardiovascular:     Rate and Rhythm: Normal rate. Rhythm irregular.     Pulses: Normal pulses.     Heart sounds: Normal heart sounds. No murmur heard.   Pulmonary:     Effort: Pulmonary effort is normal. No respiratory distress.     Breath sounds: Normal breath sounds. No wheezing.  Abdominal:     General: Bowel sounds are normal. There is no distension.     Palpations: Abdomen is soft.     Tenderness: There is no  abdominal tenderness.  Musculoskeletal:     Cervical back: Normal range of motion.     Right lower leg: No edema.     Left lower leg: No edema.  Feet:     Right foot:     Toenail Condition: Right toenails are abnormally thick. Fungal disease present.    Left foot:     Toenail Condition: Left toenails are abnormally thick. Fungal disease present. Lymphadenopathy:     Cervical: No cervical adenopathy.  Skin:    General: Skin is warm and dry.     Capillary Refill: Capillary refill takes less than 2 seconds.  Neurological:     General: No focal deficit present.     Mental Status: She is alert and oriented to person, place, and time.     Motor: Weakness present.     Gait: Gait abnormal.     Comments: Motorized wheelchair, left sided weakness  Psychiatric:        Mood and Affect: Mood normal.        Behavior: Behavior normal.     Labs reviewed: Recent Labs    08/17/20 0731 08/18/20 0255 10/12/20 0000 01/19/21 0000 03/15/21 0000  NA 142 137 137 140 134*  K 3.7 3.9 5.0 4.3 4.0  CL 105 106 101 97* 95*  CO2 24 23 22  28* 29*  GLUCOSE 112* 134*  --   --   --   BUN 24* 21 18 30* 26*  CREATININE 0.89 1.01* 0.7 1.0 0.9  CALCIUM 9.3 8.7* 9.0 10.2 9.2   Recent Labs    08/17/20 0731 08/18/20 0255  AST 32 26  ALT 24 21  ALKPHOS 62 53  BILITOT 1.2 1.2  PROT 6.5 6.1*  ALBUMIN 3.7 3.4*   Recent Labs    08/17/20 0731 08/17/20 1322 08/18/20 0255 08/23/20 0521 08/23/20 1621 08/30/20 0000 10/12/20 0000 03/22/21 0000  WBC 15.5* 12.7* 15.9* 16.9*  --  13.9 8.7 5.4  NEUTROABS 11.9* 11.1*  --   --   --  10.40  --   --   HGB 11.2* 9.7* 9.1* 7.3*   < > 9.6* 11.5* 13.4  HCT 35.0* 31.3* 29.2* 24.1*   < > 29* 36 40  MCV 92.3 93.4 94.5 97.6  --   --   --   --   PLT 84* 81* 81* 138*  --  197 105* 126*   < > = values in this interval not displayed.   Lab Results  Component Value Date   TSH 2.48 03/22/2021   Lab Results  Component Value Date   HGBA1C 5.7 (H) 08/19/2020   Lab  Results  Component Value Date   CHOL 166 07/22/2020   HDL 53 07/22/2020   LDLCALC 106 03/25/2019   LDLDIRECT 81.3 08/19/2020   TRIG 54 07/22/2020   CHOLHDL 2 09/12/2010    Significant Diagnostic Results in last 30 days:  No results found.  Assessment/Plan 1. Essential hypertension - uncontrolled - goal < 130/90 - blood pressures bid x 1 week- 2 hours after bp meds - cont to limit sodium in diet - cont HCTZ 50 mg daily - cont atenolol 50 mg bid - cont hydralazine 10 mg tid pen SBP > 170 - BUN/Creat 26/0.9 03/15/2021  2. Onychomycosis - unchanged from last month - cont tea tree oil applications to toenails bid  3. 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 Permanent atrial fibrillation (HCC) - rate controlled with atenolol - cont aspirin 81 mg daily   5. Atrioventricular block, complete (HCC) - s/p pacemaker  6. Oropharyngeal dysphagia - no recent aspirations - cont regular diet  7. Acute pain of right knee - ongoing, reports knee feeling "cold" - cont prn tylenol, voltaren and tramadol  8. Slow transit constipation - regular BM with daily miralax    Family/ staff Communication: plan discussed with patient and nurse  Labs/tests ordered:  Blood pressures bid x 1 week

## 2021-04-17 DIAGNOSIS — Z20828 Contact with and (suspected) exposure to other viral communicable diseases: Secondary | ICD-10-CM | POA: Diagnosis not present

## 2021-04-17 LAB — BASIC METABOLIC PANEL
BUN: 26 — AB (ref 4–21)
CO2: 30 — AB (ref 13–22)
Chloride: 94 — AB (ref 99–108)
Creatinine: 1.1 (ref 0.5–1.1)
Glucose: 151
Potassium: 3.3 — AB (ref 3.4–5.3)
Sodium: 134 — AB (ref 137–147)

## 2021-04-17 LAB — CBC AND DIFFERENTIAL
HCT: 38 (ref 36–46)
Hemoglobin: 12.9 (ref 12.0–16.0)
Platelets: 92 — AB (ref 150–399)
WBC: 8.3

## 2021-04-17 LAB — COMPREHENSIVE METABOLIC PANEL: Calcium: 9 (ref 8.7–10.7)

## 2021-04-17 LAB — CBC: RBC: 4.34 (ref 3.87–5.11)

## 2021-04-18 ENCOUNTER — Encounter: Payer: Self-pay | Admitting: Internal Medicine

## 2021-04-18 ENCOUNTER — Non-Acute Institutional Stay (SKILLED_NURSING_FACILITY): Payer: Medicare Other | Admitting: Internal Medicine

## 2021-04-18 DIAGNOSIS — R059 Cough, unspecified: Secondary | ICD-10-CM | POA: Diagnosis not present

## 2021-04-18 DIAGNOSIS — J160 Chlamydial pneumonia: Secondary | ICD-10-CM

## 2021-04-18 DIAGNOSIS — I69354 Hemiplegia and hemiparesis following cerebral infarction affecting left non-dominant side: Secondary | ICD-10-CM

## 2021-04-18 DIAGNOSIS — I10 Essential (primary) hypertension: Secondary | ICD-10-CM

## 2021-04-18 DIAGNOSIS — D696 Thrombocytopenia, unspecified: Secondary | ICD-10-CM

## 2021-04-18 DIAGNOSIS — E876 Hypokalemia: Secondary | ICD-10-CM

## 2021-04-18 DIAGNOSIS — I4821 Permanent atrial fibrillation: Secondary | ICD-10-CM | POA: Diagnosis not present

## 2021-04-18 DIAGNOSIS — R1312 Dysphagia, oropharyngeal phase: Secondary | ICD-10-CM

## 2021-04-18 DIAGNOSIS — Z20828 Contact with and (suspected) exposure to other viral communicable diseases: Secondary | ICD-10-CM | POA: Diagnosis not present

## 2021-04-18 DIAGNOSIS — Z9189 Other specified personal risk factors, not elsewhere classified: Secondary | ICD-10-CM | POA: Diagnosis not present

## 2021-04-18 NOTE — Progress Notes (Signed)
Location:   [facility] Room Number: 118 Place of Service:  SNF [phone]) Provider:  Veleta Miners MD  [name], MD  Patient Care Team: [name], MD as PCP - General (Internal Medicine) [name], MD as PCP - Electrophysiology (Cardiology) [name], MD as Consulting Physician (Dermatology) [name], MD as Consulting Physician (Cardiology) [facility] [name], MD as Consulting Physician (Dermatology)  Extended Emergency Contact Information Primary Emergency Contact: [name] of [place] Phone: [phone] Mobile Phone: [phone] Relation: Daughter Secondary Emergency Contact: [name],[name]  [name] of [place] Phone: [phone] Relation: Son  Code Status:  DNR Goals of care: Advanced Directive information Advanced Directives 04/12/2021  Does Patient Have a Medical Advance Directive? Yes  Type of Paramedic of [place];Living will;Out of facility DNR (pink MOST or yellow form)  Does patient want to make changes to medical advance directive? No - Patient declined  Copy of [place] in Chart? Yes - validated most recent copy scanned in chart (See row information)  Pre-existing out of facility DNR order (yellow form or pink MOST form) -     Chief Complaint  Patient presents with   Acute Visit    Cough    HPI:  Pt is a 87 y.o. 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  With extraperitoneal hemorrhage in 10/21 Patient was  off Eliquis before her  stroke due to ruptured Baker's cyst with hemorrhage before her embolic stroke Over the weekend patient spiked a temp of 102.  With cough mostly dry.  No chest pain or shortness of breath.  Rapid COVID was negative.  PCR pending She had chest x-ray done which showed left lower lobe infiltrate pneumonia.  With mild pleural effusion Patient denies any signs of dysphagia. Has not had any fever this morning continues to deny any shortness of breath.  Just mild weakness.  Daughter in the room. Patient is on COVID isolation until PCR comes back    Past Medical History:  Diagnosis Date   Actinic keratosis 05/25/2014   Anxiety    Atrial fibrillation ([place]) 09/21/2010   Basal cell carcinoma 05/25/2014   Multiple removed by Dr. Danella Sensing in March 2015: right neck, left neck, scalp    Bell's palsy 07/18/1979   Cervicalgia 01/31/2012   Closed fracture of lumbar vertebra without mention of spinal cord injury 07/17/2005   Conjunctiva disorder 12/26/2010   Coronary atherosclerosis of native coronary artery 07/17/1997   Cramp of limb 08/16/2009   Degeneration of lumbar or lumbosacral intervertebral disc 01/31/2012   Disturbance of skin sensation 08/2009   Dizziness and giddiness 11/08/2011   vertigo   External hemorrhoids without mention of complication 2/69/4854   External hemorrhoids without mention of complication 62/70/3500   Ganglion of tendon sheath 08/16/2009   Leg cramp 10/27/2013   Most frequently the right leg.    Long term (current) use of anticoagulants 09/2010   Lumbago 07/2009   Meralgia paresthetica 07/2007   MI, old    Myalgia and myositis, unspecified 01/17/2012   Osteoporosis    Other abnormal  blood chemistry 04/08/2012   Other abnormal blood chemistry 2013   hyperglycemia   Other disorder of muscle, ligament, and fascia 04/08/2012   Other specified cardiac dysrhythmias(427.89) 06/27/2010   Pacemaker 05/06/2020   Pain in joint, ankle and foot 10/27/2013   Bilateral since 1998    Pain  in joint, shoulder region 08/12/2012   Pain in joint, upper arm 12/26/2010   Pain in limb 01/11/2011   Pain in thoracic spine 01/31/2012   Pathologic fracture of vertebrae 01/22/2012   PVC's (premature ventricular contractions)    Rash and other nonspecific skin eruption 08/02/2011   Senile osteoporosis 07/17/1993   Stroke ([place])    Unspecified essential hypertension 07/17/1997   Varicose veins of lower extremities 08/16/2009   Varicose veins of lower extremities 08/16/2009   Past Surgical History:  Procedure Laterality Date   COLONOSCOPY WITH PROPOFOL N/A 10/19/2015   Procedure: COLONOSCOPY WITH PROPOFOL;  Surgeon: Gatha Mayer, MD;  Location: [facility];  Service: Endoscopy;  Laterality: N/A;   CORONARY ARTERY BYPASS GRAFT  1998   x2; LIMA to LAD; SVG to diagonal off bypass   HEMORRHOID SURGERY  08/26/2012   Dr. Brantley Stage   LOOP RECORDER INSERTION N/A 05/13/2018   Procedure: LOOP RECORDER INSERTION;  Surgeon: [name], MD;  Location: [facility];  Service: Cardiovascular;  Laterality: N/A;   LOOP RECORDER REMOVAL N/A 05/26/2019   Procedure: LOOP RECORDER REMOVAL;  Surgeon: [name], MD;  Location: [facility];  Service: Cardiovascular;  Laterality: N/A;   MASS EXCISION Left 11/23/2015   Procedure: LEFT WRIST EXCISION CYST;  Surgeon: Leanora Cover, MD;  Location: [facility];  Service: Orthopedics;  Laterality: Left;   PACEMAKER IMPLANT N/A 05/26/2019   Procedure: PACEMAKER IMPLANT;  Surgeon: [name], MD;  Location: [facility];  Service: Cardiovascular;  Laterality: N/A;   SKIN BIOPSY  01/29/14   (R) neck; (R) scalp, 2 (L) neck; shave biopsy Superficial basal cell carcinoma Dr. Danella Sensing   TONSILLECTOMY  [id number]    Allergies  Allergen Reactions   Iodinated Diagnostic Agents Nausea Only and Other (See Comments)    Severe nausea and also passed out   Iodine    Other     Seasonal Allergies.   Bactrim Rash   Relafen [Nabumetone] Rash    Sulfanilamide Rash    Allergies as of 04/18/2021       Reactions   Iodinated Diagnostic Agents Nausea Only, Other (See Comments)   Severe nausea and also passed out   Iodine    Other    Seasonal Allergies.   Bactrim Rash   Relafen [nabumetone] Rash   Sulfanilamide Rash        Medication List        Accurate as of April 18, 2021 11:44 AM. If you have any questions, ask your nurse or doctor.          acetaminophen 500 MG tablet Commonly known as: TYLENOL Take 1,000 mg by mouth every 6 (six) hours as needed for mild pain (pain.).   aspirin 81 MG EC tablet Take 1 tablet (81 mg total) by mouth daily. Swallow whole.   atenolol 50 MG tablet Commonly known as: TENORMIN Take 50 mg by mouth 2 (two) times daily.   calcium-vitamin D 500-200 MG-UNIT tablet Commonly known as: OSCAL WITH D Take 1 tablet by mouth daily.   diclofenac Sodium 1 % Gel Commonly known as: VOLTAREN Apply 4 g topically 4 (  four) times daily as needed. Left knee, right knee, left ankle   digoxin 0.125 MG tablet Commonly known as: LANOXIN Take 0.125 mg by mouth every other day.   guaiFENesin 100 MG/5ML liquid Commonly known as: ROBITUSSIN Take 100 mg by mouth every 6 (six) hours as needed for cough.   hydrALAZINE 10 MG tablet Commonly known as: APRESOLINE Take 10 mg by mouth 3 (three) times daily as needed. TID PRN for SBP >170   hydrochlorothiazide 25 MG tablet Commonly known as: HYDRODIURIL Take 50 mg by mouth daily.   LORazepam 1 MG tablet Commonly known as: ATIVAN Take 1 tablet (1 mg total) by mouth at bedtime.   ondansetron 4 MG tablet Commonly known as: ZOFRAN Take 1 tablet (4 mg total) by mouth every 6 (six) hours as needed for nausea.   polyethylene glycol 17 g packet Commonly known as: MIRALAX / GLYCOLAX Take 17 g by mouth every other day.   Tea Tree Oil Oil Apply 2 drops topically in the morning and at bedtime. Apply 1-2 gtts to affected toes with fungus ( left great and  second) twice daily for 2 months.   traMADol 50 MG tablet Commonly known as: ULTRAM Take 1 tablet (50 mg total) by mouth every 6 (six) hours as needed for moderate pain or severe pain.        Review of Systems  Constitutional:  Positive for activity change.  HENT:  Positive for congestion and rhinorrhea.   Respiratory:  Positive for cough.   Cardiovascular: Negative.   Gastrointestinal: Negative.   Genitourinary: Negative.   Musculoskeletal: Negative.   Skin: Negative.   Neurological:  Positive for weakness.  Psychiatric/Behavioral: Negative.    All other systems reviewed and are negative.  Immunization History  Administered Date(s) Administered   Influenza Whole 08/06/2012   Influenza, High Dose Seasonal PF 08/15/2019, 08/27/2020   Influenza,inj,Quad PF,6+ Mos 08/30/2018   Influenza-Unspecified 08/06/2013, 08/24/2014, 08/26/2015, 08/31/2016, 08/27/2017, 08/27/2020   Moderna Sars-Covid-2 Vaccination 11/16/2019, 12/16/2019, 09/16/2020   Pneumococcal Conjugate-13 09/22/2015   Pneumococcal Polysaccharide-23 08/29/2006   Td 10/07/2007   Zoster Recombinat (Shingrix) 02/12/2018, 05/13/2018   Zoster, Live 03/12/2008   Pertinent  Health Maintenance Due  Topic Date Due   INFLUENZA VACCINE  06/06/2021   DEXA SCAN  Completed   PNA vac Low Risk Adult  Completed   Fall Risk  09/23/2020 08/26/2020 07/28/2020 02/25/2020 01/28/2020  Falls in the past year? 1 1 0 0 0  Number falls in past yr: 0 0 1 0 0  Comment - - - - -  Injury with Fall? 1 1 0 0 0  Comment Patient fractured pelvis and had a stroke - - - -  Risk for fall due to : - History of fall(s);Impaired balance/gait;Impaired mobility;Orthopedic patient - - -  Follow up - Falls evaluation completed;Education provided;Falls prevention discussed;Follow up appointment - - -   Functional Status Survey:    Vitals:   04/18/21 1132  BP: (!) 163/83  Pulse: 78  Resp: 20  Temp: (!) 97.5 F (36.4 C)  SpO2: 95%  Weight: 126 lb 9.6  oz (57.4 kg)  Height: 5\' 6"  (1.676 m)   Body mass index is 20.43 kg/m. Physical Exam Constitutional: Oriented to person, place, and time. Well-developed and well-nourished.  HENT:  Head: Normocephalic.  Mouth/Throat: Oropharynx is clear and moist.  Eyes: Pupils are equal, round, and reactive to light.  Neck: Neck supple.  Cardiovascular: Normal rate and normal heart sounds.  No murmur heard. Pulmonary/Chest:  Effort normal and breath sounds normal. No respiratory distress. No wheezes. She had few rales in left lower lobe Abdominal: Soft. Bowel sounds are normal. No distension. There is no tenderness. There is no rebound.  Musculoskeletal: No edema.  Lymphadenopathy: none Neurological: Alert and oriented to person, place, and time.  Skin: Skin is warm and dry.  Psychiatric: Normal mood and affect. Behavior is normal. Thought content normal.    Labs reviewed: Recent Labs    08/17/20 0731 08/18/20 0255 10/12/20 0000 01/19/21 0000 03/15/21 0000 04/17/21 0000  NA 142 137   < > 140 134* 134*  K 3.7 3.9   < > 4.3 4.0 3.3*  CL 105 106   < > 97* 95* 94*  CO2 24 23   < > 28* 29* 30*  GLUCOSE 112* 134*  --   --   --   --   BUN 24* 21   < > 30* 26* 26*  CREATININE 0.89 1.01*   < > 1.0 0.9 1.1  CALCIUM 9.3 8.7*   < > 10.2 9.2 9.0   < > = values in this interval not displayed.   Recent Labs    08/17/20 0731 08/18/20 0255  AST 32 26  ALT 24 21  ALKPHOS 62 53  BILITOT 1.2 1.2  PROT 6.5 6.1*  ALBUMIN 3.7 3.4*   Recent Labs    08/17/20 0731 08/17/20 1322 08/18/20 0255 08/23/20 0521 08/23/20 1621 08/30/20 0000 10/12/20 0000 03/22/21 0000 04/17/21 0000  WBC 15.5* 12.7* 15.9* 16.9*  --  13.9 8.7 5.4 8.3  NEUTROABS 11.9* 11.1*  --   --   --  10.40  --   --   --   HGB 11.2* 9.7* 9.1* 7.3*   < > 9.6* 11.5* 13.4 12.9  HCT 35.0* 31.3* 29.2* 24.1*   < > 29* 36 40 38  MCV 92.3 93.4 94.5 97.6  --   --   --   --   --   PLT 84* 81* 81* 138*  --  197 105* 126* 92*   < > = values  in this interval not displayed.   Lab Results  Component Value Date   TSH 2.48 03/22/2021   Lab Results  Component Value Date   HGBA1C 5.7 (H) 08/19/2020   Lab Results  Component Value Date   CHOL 166 07/22/2020   HDL 53 07/22/2020   LDLCALC 106 03/25/2019   LDLDIRECT 81.3 08/19/2020   TRIG 54 07/22/2020   CHOLHDL 2 09/12/2010    Significant Diagnostic Results in last 30 days:  No results found.  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

## 2021-04-22 ENCOUNTER — Encounter: Payer: Self-pay | Admitting: Orthopedic Surgery

## 2021-04-22 ENCOUNTER — Non-Acute Institutional Stay (SKILLED_NURSING_FACILITY): Payer: Medicare Other | Admitting: Orthopedic Surgery

## 2021-04-22 DIAGNOSIS — Z66 Do not resuscitate: Secondary | ICD-10-CM | POA: Diagnosis not present

## 2021-04-22 DIAGNOSIS — I10 Essential (primary) hypertension: Secondary | ICD-10-CM | POA: Diagnosis not present

## 2021-04-22 DIAGNOSIS — D696 Thrombocytopenia, unspecified: Secondary | ICD-10-CM

## 2021-04-22 DIAGNOSIS — J160 Chlamydial pneumonia: Secondary | ICD-10-CM | POA: Diagnosis not present

## 2021-04-22 NOTE — Progress Notes (Addendum)
Location:  [facility] Room Number: 118-A Place of Service:  SNF (31) Provider:  Yvonna Alanis, NP   Patient Care Team: [name], MD as PCP - General (Internal Medicine) [name], MD as PCP - Electrophysiology (Cardiology) [name], MD as Consulting Physician (Dermatology) [name], MD as Consulting Physician (Cardiology) [facility] [name], MD as Consulting Physician (Dermatology)  Extended Emergency Contact Information Primary Emergency Contact: [name] of [place] Phone: [phone] Mobile Phone: [phone] Relation: Daughter Secondary Emergency Contact: [name],[name]  [name] of [place] Phone: [phone] Relation: Son  Code Status:  DNR Goals of care: Advanced Directive information Advanced Directives 04/12/2021  Does Patient Have a Medical Advance Directive? Yes  Type of Paramedic of [place];Living will;Out of facility DNR (pink MOST or yellow form)  Does patient want to make changes to medical advance directive? No - Patient declined  Copy of [place] in Chart? Yes - validated most recent copy scanned in chart (See row information)  Pre-existing out of facility DNR order (yellow form or pink MOST form) -     Chief Complaint  Patient presents with   Acute Visit    Elevated blood pressure     HPI:  Pt is a 87 y.o. female seen today for an acute visit for seen today for elevated blood pressure.   06/07 she was seen for routine exam. Chart review revealed elevated blood pressures. Nursing staff was ordered to check blood pressures for the past week bid, 2 hours after receiving blood pressure medications. She has a history of CVA with left sided weakness since 08/2020. She is taking atenolol 50 mg bid and HCTZ 50 mg daily for elevated blood pressure. Goal pressures < 130/90. She denies chest pain, sob,  dizziness or headaches.   Recent blood pressures:  06/08- 150/72, 125/78  06/09- 158/88, 166/73  06/10- 160/90, 132/77  06/11- 154/84, 142/61  06/12- 115/72, 132/67  06/13- 140/68, 133/68  06/14- 152/86, 146/95  06/15- 142/100,132/70, 151/71  06/13 diagnosed with left lower lobe pneumonia. She was started on Levaquin 500 mg daily for 7 days. Covid test negative. She is feeling better, just tired at times.   Nurse does not report any concerns, vitals stable.     Past Medical History:  Diagnosis Date   Actinic keratosis 05/25/2014   Anxiety    Atrial fibrillation ([place]) 09/21/2010   Basal cell carcinoma 05/25/2014   Multiple removed by Dr. Danella Sensing in March 2015: right neck, left neck, scalp    Bell's palsy 07/18/1979   Cervicalgia 01/31/2012   Closed fracture of lumbar vertebra without mention of spinal cord injury 07/17/2005   Conjunctiva disorder 12/26/2010   Coronary atherosclerosis of native coronary artery 07/17/1997   Cramp of limb 08/16/2009   Degeneration of lumbar or lumbosacral intervertebral disc 01/31/2012   Disturbance of skin sensation 08/2009   Dizziness and giddiness 11/08/2011   vertigo   External hemorrhoids without mention of complication 01/17/9701   External hemorrhoids without mention of complication 63/78/5885   Ganglion of tendon sheath 08/16/2009   Leg cramp 10/27/2013   Most frequently the right leg.    Long term (current) use of anticoagulants 09/2010   Lumbago 07/2009   Meralgia paresthetica 07/2007   MI, old    Myalgia and myositis, unspecified 01/17/2012   Osteoporosis    Other abnormal blood chemistry 04/08/2012   Other abnormal blood chemistry 2013  hyperglycemia   Other disorder of muscle, ligament, and fascia 04/08/2012   Other specified cardiac dysrhythmias(427.89) 06/27/2010   Pacemaker 05/06/2020   Pain in joint, ankle and foot 10/27/2013   Bilateral since 1998    Pain in joint, shoulder region 08/12/2012   Pain in joint, upper arm 12/26/2010    Pain in limb 01/11/2011   Pain in thoracic spine 01/31/2012   Pathologic fracture of vertebrae 01/22/2012   PVC's (premature ventricular contractions)    Rash and other nonspecific skin eruption 08/02/2011   Senile osteoporosis 07/17/1993   Stroke ([place])    Unspecified essential hypertension 07/17/1997   Varicose veins of lower extremities 08/16/2009   Varicose veins of lower extremities 08/16/2009   Past Surgical History:  Procedure Laterality Date   COLONOSCOPY WITH PROPOFOL N/A 10/19/2015   Procedure: COLONOSCOPY WITH PROPOFOL;  Surgeon: Gatha Mayer, MD;  Location: [facility];  Service: Endoscopy;  Laterality: N/A;   CORONARY ARTERY BYPASS GRAFT  1998   x2; LIMA to LAD; SVG to diagonal off bypass   HEMORRHOID SURGERY  08/26/2012   Dr. Brantley Stage   LOOP RECORDER INSERTION N/A 05/13/2018   Procedure: LOOP RECORDER INSERTION;  Surgeon: [name], MD;  Location: [facility];  Service: Cardiovascular;  Laterality: N/A;   LOOP RECORDER REMOVAL N/A 05/26/2019   Procedure: LOOP RECORDER REMOVAL;  Surgeon: [name], MD;  Location: [facility];  Service: Cardiovascular;  Laterality: N/A;   MASS EXCISION Left 11/23/2015   Procedure: LEFT WRIST EXCISION CYST;  Surgeon: Leanora Cover, MD;  Location: [facility];  Service: Orthopedics;  Laterality: Left;   PACEMAKER IMPLANT N/A 05/26/2019   Procedure: PACEMAKER IMPLANT;  Surgeon: [name], MD;  Location: [facility];  Service: Cardiovascular;  Laterality: N/A;   SKIN BIOPSY  01/29/14   (R) neck; (R) scalp, 2 (L) neck; shave biopsy Superficial basal cell carcinoma Dr. Danella Sensing   TONSILLECTOMY  [phone]    Allergies  Allergen Reactions   Iodinated Diagnostic Agents Nausea Only and Other (See Comments)    Severe nausea and also passed out   Iodine    Other     Seasonal Allergies.   Bactrim Rash   Relafen [Nabumetone] Rash   Sulfanilamide Rash    Outpatient Encounter Medications as of  04/22/2021  Medication Sig   acetaminophen (TYLENOL) 500 MG tablet Take 1,000 mg by mouth every 6 (six) hours as needed for mild pain (pain.).    aspirin EC 81 MG EC tablet Take 1 tablet (81 mg total) by mouth daily. Swallow whole.   atenolol (TENORMIN) 50 MG tablet Take 50 mg by mouth 2 (two) times daily.   calcium-vitamin D (OSCAL WITH D) 500-200 MG-UNIT tablet Take 1 tablet by mouth daily.   diclofenac Sodium (VOLTAREN) 1 % GEL Apply 4 g topically 4 (four) times daily as needed. Left knee, right knee, left ankle   digoxin (LANOXIN) 0.125 MG tablet Take 0.125 mg by mouth every other day.   guaiFENesin (ROBITUSSIN) 100 MG/5ML liquid Take 100 mg by mouth every 6 (six) hours as needed for cough.   hydrALAZINE (APRESOLINE) 10 MG tablet Take 10 mg by mouth 3 (three) times daily as needed. TID PRN for SBP >170   hydrochlorothiazide (HYDRODIURIL) 50 MG tablet Take 50 mg by mouth daily.   levofloxacin (LEVAQUIN) 500 MG tablet Take 500 mg by mouth daily. For PNA   LORazepam (ATIVAN) 1 MG tablet Take 1 tablet (1  mg total) by mouth at bedtime.   ondansetron (ZOFRAN) 4 MG tablet Take 1 tablet (4 mg total) by mouth every 6 (six) hours as needed for nausea.   polyethylene glycol (MIRALAX / GLYCOLAX) 17 g packet Take 17 g by mouth every other day.   potassium chloride SA (KLOR-CON) 20 MEQ tablet Take 20 mEq by mouth daily.   Tea Tree Oil OIL Apply 2 drops topically in the morning and at bedtime. Apply 1-2 gtts to affected toes with fungus ( left great and second) twice daily for 2 months.   traMADol (ULTRAM) 50 MG tablet Take 1 tablet (50 mg total) by mouth every 6 (six) hours as needed for moderate pain or severe pain.   [DISCONTINUED] hydrochlorothiazide (HYDRODIURIL) 25 MG tablet Take 50 mg by mouth daily.   No facility-administered encounter medications on file as of 04/22/2021.    Review of Systems  Constitutional:  Negative for activity change, appetite change, fatigue and fever.  HENT:  Positive for  congestion. Negative for sneezing and sore throat.   Respiratory:  Negative for cough, shortness of breath and wheezing.   Cardiovascular:  Negative for chest pain and leg swelling.  Neurological:  Positive for weakness.       Left sided weakness  Psychiatric/Behavioral:  Negative for dysphoric mood. The patient is not nervous/anxious.    Immunization History  Administered Date(s) Administered   Influenza Whole 08/06/2012   Influenza, High Dose Seasonal PF 08/15/2019, 08/27/2020   Influenza,inj,Quad PF,6+ Mos 08/30/2018   Influenza-Unspecified 08/06/2013, 08/24/2014, 08/26/2015, 08/31/2016, 08/27/2017, 08/27/2020   Moderna Sars-Covid-2 Vaccination 11/16/2019, 12/16/2019, 09/16/2020   Pneumococcal Conjugate-13 09/22/2015   Pneumococcal Polysaccharide-23 08/29/2006   Td 10/07/2007   Zoster Recombinat (Shingrix) 02/12/2018, 05/13/2018   Zoster, Live 03/12/2008   Pertinent  Health Maintenance Due  Topic Date Due   INFLUENZA VACCINE  06/06/2021   DEXA SCAN  Completed   PNA vac Low Risk Adult  Completed   Fall Risk  09/23/2020 08/26/2020 07/28/2020 02/25/2020 01/28/2020  Falls in the past year? 1 1 0 0 0  Number falls in past yr: 0 0 1 0 0  Comment - - - - -  Injury with Fall? 1 1 0 0 0  Comment Patient fractured pelvis and had a stroke - - - -  Risk for fall due to : - History of fall(s);Impaired balance/gait;Impaired mobility;Orthopedic patient - - -  Follow up - Falls evaluation completed;Education provided;Falls prevention discussed;Follow up appointment - - -   Functional Status Survey:    Vitals:   04/22/21 1133  BP: (!) 146/73  Pulse: 81  Resp: 20  Temp: 98.5 F (36.9 C)  SpO2: 96%  Weight: 126 lb (57.2 kg)  Height: 5\' 6"  (1.676 m)   Body mass index is 20.34 kg/m. Physical Exam Vitals reviewed.  Constitutional:      General: She is not in acute distress. HENT:     Head: Normocephalic.  Cardiovascular:     Rate and Rhythm: Normal rate. Rhythm irregular.      Pulses: Normal pulses.     Heart sounds: Normal heart sounds.  Pulmonary:     Effort: Pulmonary effort is normal. No respiratory distress.     Breath sounds: Decreased air movement present. Examination of the left-lower field reveals decreased breath sounds. Decreased breath sounds present. No wheezing.  Musculoskeletal:     Right lower leg: No edema.     Left lower leg: No edema.  Skin:    General: Skin is  warm and dry.  Neurological:     General: No focal deficit present.     Mental Status: She is alert and oriented to person, place, and time.     Motor: Weakness present.     Gait: Gait abnormal.     Comments: Left sided weakness, motorized wheelchair  Psychiatric:        Mood and Affect: Mood normal.        Behavior: Behavior normal.    Labs reviewed: Recent Labs    08/17/20 0731 08/18/20 0255 10/12/20 0000 01/19/21 0000 03/15/21 0000 04/17/21 0000  NA 142 137   < > 140 134* 134*  K 3.7 3.9   < > 4.3 4.0 3.3*  CL 105 106   < > 97* 95* 94*  CO2 24 23   < > 28* 29* 30*  GLUCOSE 112* 134*  --   --   --   --   BUN 24* 21   < > 30* 26* 26*  CREATININE 0.89 1.01*   < > 1.0 0.9 1.1  CALCIUM 9.3 8.7*   < > 10.2 9.2 9.0   < > = values in this interval not displayed.   Recent Labs    08/17/20 0731 08/18/20 0255  AST 32 26  ALT 24 21  ALKPHOS 62 53  BILITOT 1.2 1.2  PROT 6.5 6.1*  ALBUMIN 3.7 3.4*   Recent Labs    08/17/20 0731 08/17/20 1322 08/18/20 0255 08/23/20 0521 08/23/20 1621 08/30/20 0000 10/12/20 0000 03/22/21 0000 04/17/21 0000  WBC 15.5* 12.7* 15.9* 16.9*  --  13.9 8.7 5.4 8.3  NEUTROABS 11.9* 11.1*  --   --   --  10.40  --   --   --   HGB 11.2* 9.7* 9.1* 7.3*   < > 9.6* 11.5* 13.4 12.9  HCT 35.0* 31.3* 29.2* 24.1*   < > 29* 36 40 38  MCV 92.3 93.4 94.5 97.6  --   --   --   --   --   PLT 84* 81* 81* 138*  --  197 105* 126* 92*   < > = values in this interval not displayed.   Lab Results  Component Value Date   TSH 2.48 03/22/2021   Lab  Results  Component Value Date   HGBA1C 5.7 (H) 08/19/2020   Lab Results  Component Value Date   CHOL 166 07/22/2020   HDL 53 07/22/2020   LDLCALC 106 03/25/2019   LDLDIRECT 81.3 08/19/2020   TRIG 54 07/22/2020   CHOLHDL 2 09/12/2010    Significant Diagnostic Results in last 30 days:  No results found.  Assessment/Plan 1. Do not resuscitate  2. Essential hypertension - uncontrolled - goal < 130/90 - blood pressure elevated prior to developing PNA - will add amlodipine 5 mg po daily for better control - cont daily blood pressures  3. Pneumonia of left lower lobe due to Chlamydia species - she reports feeling better - decreased lung sounds over left lower lobe - cont levaquin 500 mg daily  4. Thrombocytopenia ([place]) - platelets 92 04/17/2020 - cbc/diff in 4 weeks   Family/ staff Communication: plan discussed with patient and nurse  Labs/tests ordered:  cbc/diff in 4 weeks

## 2021-04-25 DIAGNOSIS — D72829 Elevated white blood cell count, unspecified: Secondary | ICD-10-CM | POA: Diagnosis not present

## 2021-04-25 LAB — COMPREHENSIVE METABOLIC PANEL: Calcium: 9.8 (ref 8.7–10.7)

## 2021-04-25 LAB — BASIC METABOLIC PANEL
BUN: 23 — AB (ref 4–21)
CO2: 32 — AB (ref 13–22)
Chloride: 91 — AB (ref 99–108)
Creatinine: 1.1 (ref 0.5–1.1)
Glucose: 99
Potassium: 4 (ref 3.4–5.3)
Sodium: 135 — AB (ref 137–147)

## 2021-04-26 ENCOUNTER — Non-Acute Institutional Stay (SKILLED_NURSING_FACILITY): Payer: Medicare Other | Admitting: Orthopedic Surgery

## 2021-04-26 ENCOUNTER — Encounter: Payer: Self-pay | Admitting: Orthopedic Surgery

## 2021-04-26 DIAGNOSIS — R058 Other specified cough: Secondary | ICD-10-CM

## 2021-04-26 DIAGNOSIS — J160 Chlamydial pneumonia: Secondary | ICD-10-CM

## 2021-04-26 MED ORDER — GUAIFENESIN ER 600 MG PO TB12: 600.0000 mg | ORAL_TABLET | Freq: Two times a day (BID) | ORAL | 0 refills | Status: AC

## 2021-04-26 NOTE — Progress Notes (Signed)
Location:  [facility] Room Number: 118 Place of Service:  SNF [phone]) Provider:  Windell Moulding, AGNP-C  [name], MD  Patient Care Team: [name], MD as PCP - General (Internal Medicine) [name], MD as PCP - Electrophysiology (Cardiology) [name], MD as Consulting Physician (Dermatology) [name], MD as Consulting Physician (Cardiology) [facility] [name], MD as Consulting Physician (Dermatology)  Extended Emergency Contact Information Primary Emergency Contact: [name] of [place] Phone: [phone] Mobile Phone: [phone] Relation: Daughter Secondary Emergency Contact: [name],[name]  [name] of [place] Phone: [phone] Relation: Son  Code Status:  DNR Goals of care: Advanced Directive information Advanced Directives 04/12/2021  Does Patient Have a Medical Advance Directive? Yes  Type of Paramedic of [place];Living will;Out of facility DNR (pink MOST or yellow form)  Does patient want to make changes to medical advance directive? No - Patient declined  Copy of [place] in Chart? Yes - validated most recent copy scanned in chart (See row information)  Pre-existing out of facility DNR order (yellow form or pink MOST form) -     Chief Complaint  Patient presents with   Acute Visit    Productive cough    HPI:  Pt is a 87 y.o. female seen today for acute visit for productive cough.   06/13 she presented with fever 102 and dry cough. COVID negative. CXR confirmed left lower lobe infiltrate pneumonia, with mild pleural effusion. She was started on Levaquin 750 mg po daily x 7 days. Today, she reports feeling much better but her cough is now productive. Sputum thick, yellow/green in color. In the past she has used Mucinex for chest colds, requesting some today. Denies chest pain and sob. Vitals stable,  95% room air, respirations 20.   Past Medical History:  Diagnosis Date   Actinic keratosis 05/25/2014   Anxiety    Atrial fibrillation ([place]) 09/21/2010   Basal cell carcinoma 05/25/2014   Multiple removed by Dr. Danella Sensing in March 2015: right neck, left neck, scalp    Bell's palsy 07/18/1979   Cervicalgia 01/31/2012   Closed fracture of lumbar vertebra without mention of spinal cord injury 07/17/2005   Conjunctiva disorder 12/26/2010   Coronary atherosclerosis of native coronary artery 07/17/1997   Cramp of limb 08/16/2009   Degeneration of lumbar or lumbosacral intervertebral disc 01/31/2012   Disturbance of skin sensation 08/2009   Dizziness and giddiness 11/08/2011   vertigo   External hemorrhoids without mention of complication 03/14/3266   External hemorrhoids without mention of complication 12/45/8099   Ganglion of tendon sheath 08/16/2009   Leg cramp 10/27/2013   Most frequently the right leg.    Long term (current) use of anticoagulants 09/2010   Lumbago 07/2009   Meralgia paresthetica 07/2007   MI, old    Myalgia and myositis, unspecified 01/17/2012   Osteoporosis    Other abnormal blood chemistry 04/08/2012   Other abnormal blood chemistry 2013   hyperglycemia   Other disorder of muscle, ligament, and fascia 04/08/2012   Other specified cardiac dysrhythmias(427.89) 06/27/2010   Pacemaker 05/06/2020   Pain in joint, ankle and foot 10/27/2013   Bilateral since 1998    Pain in joint, shoulder region 08/12/2012   Pain in joint, upper arm 12/26/2010   Pain in limb 01/11/2011   Pain in thoracic spine 01/31/2012   Pathologic fracture of vertebrae 01/22/2012   PVC's (premature ventricular  contractions)    Rash and other nonspecific skin eruption 08/02/2011   Senile osteoporosis 07/17/1993   Stroke ([place])    Unspecified essential hypertension 07/17/1997   Varicose veins of lower extremities 08/16/2009   Varicose veins of lower extremities 08/16/2009   Past Surgical History:  Procedure  Laterality Date   COLONOSCOPY WITH PROPOFOL N/A 10/19/2015   Procedure: COLONOSCOPY WITH PROPOFOL;  Surgeon: Gatha Mayer, MD;  Location: [facility];  Service: Endoscopy;  Laterality: N/A;   CORONARY ARTERY BYPASS GRAFT  1998   x2; LIMA to LAD; SVG to diagonal off bypass   HEMORRHOID SURGERY  08/26/2012   Dr. Brantley Stage   LOOP RECORDER INSERTION N/A 05/13/2018   Procedure: LOOP RECORDER INSERTION;  Surgeon: [name], MD;  Location: [facility];  Service: Cardiovascular;  Laterality: N/A;   LOOP RECORDER REMOVAL N/A 05/26/2019   Procedure: LOOP RECORDER REMOVAL;  Surgeon: [name], MD;  Location: [facility];  Service: Cardiovascular;  Laterality: N/A;   MASS EXCISION Left 11/23/2015   Procedure: LEFT WRIST EXCISION CYST;  Surgeon: Leanora Cover, MD;  Location: [facility];  Service: Orthopedics;  Laterality: Left;   PACEMAKER IMPLANT N/A 05/26/2019   Procedure: PACEMAKER IMPLANT;  Surgeon: [name], MD;  Location: [facility];  Service: Cardiovascular;  Laterality: N/A;   SKIN BIOPSY  01/29/14   (R) neck; (R) scalp, 2 (L) neck; shave biopsy Superficial basal cell carcinoma Dr. Danella Sensing   TONSILLECTOMY  [phone]    Allergies  Allergen Reactions   Iodinated Diagnostic Agents Nausea Only and Other (See Comments)    Severe nausea and also passed out   Iodine    Other     Seasonal Allergies.   Bactrim Rash   Relafen [Nabumetone] Rash   Sulfanilamide Rash    Outpatient Encounter Medications as of 04/26/2021  Medication Sig   acetaminophen (TYLENOL) 500 MG tablet Take 1,000 mg by mouth every 6 (six) hours as needed for mild pain (pain.).    aspirin EC 81 MG EC tablet Take 1 tablet (81 mg total) by mouth daily. Swallow whole.   atenolol (TENORMIN) 50 MG tablet Take 50 mg by mouth 2 (two) times daily.   calcium-vitamin D (OSCAL WITH D) 500-200 MG-UNIT tablet Take 1 tablet by mouth daily.   diclofenac Sodium (VOLTAREN) 1 % GEL Apply 4 g  topically 4 (four) times daily as needed. Left knee, right knee, left ankle   digoxin (LANOXIN) 0.125 MG tablet Take 0.125 mg by mouth every other day.   guaiFENesin (ROBITUSSIN) 100 MG/5ML liquid Take 100 mg by mouth every 6 (six) hours as needed for cough.   hydrALAZINE (APRESOLINE) 10 MG tablet Take 10 mg by mouth 3 (three) times daily as needed. TID PRN for SBP >170   hydrochlorothiazide (HYDRODIURIL) 50 MG tablet Take 50 mg by mouth daily.   LORazepam (ATIVAN) 1 MG tablet Take 1 tablet (1 mg total) by mouth at bedtime.   ondansetron (ZOFRAN) 4 MG tablet Take 1 tablet (4 mg total) by mouth every 6 (six) hours as needed for nausea.   polyethylene glycol (MIRALAX / GLYCOLAX) 17 g packet Take 17 g by mouth every other day.   potassium chloride SA (KLOR-CON) 20 MEQ tablet Take 20 mEq by mouth daily.   Tea Tree Oil OIL Apply 2 drops topically in the morning and at bedtime. Apply 1-2 gtts to affected toes with fungus ( left great and second) twice daily  for 2 months.   traMADol (ULTRAM) 50 MG tablet Take 1 tablet (50 mg total) by mouth every 6 (six) hours as needed for moderate pain or severe pain.   No facility-administered encounter medications on file as of 04/26/2021.    Review of Systems  Constitutional:  Negative for activity change, appetite change, fatigue and fever.  HENT: Negative.    Eyes: Negative.   Respiratory:  Positive for cough. Negative for shortness of breath and wheezing.   Cardiovascular:  Negative for chest pain and leg swelling.  Skin: Negative.   Psychiatric/Behavioral:  Negative for dysphoric mood. The patient is not nervous/anxious.    Immunization History  Administered Date(s) Administered   Influenza Whole 08/06/2012   Influenza, High Dose Seasonal PF 08/15/2019, 08/27/2020   Influenza,inj,Quad PF,6+ Mos 08/30/2018   Influenza-Unspecified 08/06/2013, 08/24/2014, 08/26/2015, 08/31/2016, 08/27/2017, 08/27/2020   Moderna Sars-Covid-2 Vaccination 11/16/2019,  12/16/2019, 09/16/2020   Pneumococcal Conjugate-13 09/22/2015   Pneumococcal Polysaccharide-23 08/29/2006   Td 10/07/2007   Zoster Recombinat (Shingrix) 02/12/2018, 05/13/2018   Zoster, Live 03/12/2008   Pertinent  Health Maintenance Due  Topic Date Due   INFLUENZA VACCINE  06/06/2021   DEXA SCAN  Completed   PNA vac Low Risk Adult  Completed   Fall Risk  09/23/2020 08/26/2020 07/28/2020 02/25/2020 01/28/2020  Falls in the past year? 1 1 0 0 0  Number falls in past yr: 0 0 1 0 0  Comment - - - - -  Injury with Fall? 1 1 0 0 0  Comment Patient fractured pelvis and had a stroke - - - -  Risk for fall due to : - History of fall(s);Impaired balance/gait;Impaired mobility;Orthopedic patient - - -  Follow up - Falls evaluation completed;Education provided;Falls prevention discussed;Follow up appointment - - -   Functional Status Survey:    Vitals:   04/26/21 1359  BP: 133/76  Pulse: 76  Resp: 20  Temp: 98.2 F (36.8 C)  SpO2: 95%  Weight: 126 lb 9.6 oz (57.4 kg)  Height: 5\' 6"  (1.676 m)   Body mass index is 20.43 kg/m. Physical Exam Vitals reviewed.  Constitutional:      General: She is not in acute distress. HENT:     Head: Normocephalic.     Right Ear: There is no impacted cerumen.     Left Ear: There is no impacted cerumen.     Nose: Nose normal.     Mouth/Throat:     Mouth: Mucous membranes are moist.     Pharynx: No posterior oropharyngeal erythema.  Eyes:     General:        Right eye: No discharge.        Left eye: No discharge.  Cardiovascular:     Rate and Rhythm: Normal rate. Rhythm irregular.     Pulses: Normal pulses.     Heart sounds: Normal heart sounds.  Pulmonary:     Effort: Pulmonary effort is normal. No respiratory distress.     Breath sounds: Examination of the right-middle field reveals rhonchi. Examination of the left-middle field reveals rhonchi. Rhonchi present. No wheezing.  Chest:     Chest wall: No tenderness.  Musculoskeletal:      Cervical back: Normal range of motion.  Lymphadenopathy:     Cervical: No cervical adenopathy.  Skin:    General: Skin is warm and dry.     Capillary Refill: Capillary refill takes less than 2 seconds.  Neurological:     General: No focal deficit present.  Mental Status: She is alert and oriented to person, place, and time.     Motor: Weakness present.     Gait: Gait abnormal.     Comments: wheelchair  Psychiatric:        Mood and Affect: Mood normal.        Behavior: Behavior normal.    Labs reviewed: Recent Labs    08/17/20 0731 08/18/20 0255 10/12/20 0000 01/19/21 0000 03/15/21 0000 04/17/21 0000  NA 142 137   < > 140 134* 134*  K 3.7 3.9   < > 4.3 4.0 3.3*  CL 105 106   < > 97* 95* 94*  CO2 24 23   < > 28* 29* 30*  GLUCOSE 112* 134*  --   --   --   --   BUN 24* 21   < > 30* 26* 26*  CREATININE 0.89 1.01*   < > 1.0 0.9 1.1  CALCIUM 9.3 8.7*   < > 10.2 9.2 9.0   < > = values in this interval not displayed.   Recent Labs    08/17/20 0731 08/18/20 0255  AST 32 26  ALT 24 21  ALKPHOS 62 53  BILITOT 1.2 1.2  PROT 6.5 6.1*  ALBUMIN 3.7 3.4*   Recent Labs    08/17/20 0731 08/17/20 1322 08/18/20 0255 08/23/20 0521 08/23/20 1621 08/30/20 0000 10/12/20 0000 03/22/21 0000 04/17/21 0000  WBC 15.5* 12.7* 15.9* 16.9*  --  13.9 8.7 5.4 8.3  NEUTROABS 11.9* 11.1*  --   --   --  10.40  --   --   --   HGB 11.2* 9.7* 9.1* 7.3*   < > 9.6* 11.5* 13.4 12.9  HCT 35.0* 31.3* 29.2* 24.1*   < > 29* 36 40 38  MCV 92.3 93.4 94.5 97.6  --   --   --   --   --   PLT 84* 81* 81* 138*  --  197 105* 126* 92*   < > = values in this interval not displayed.   Lab Results  Component Value Date   TSH 2.48 03/22/2021   Lab Results  Component Value Date   HGBA1C 5.7 (H) 08/19/2020   Lab Results  Component Value Date   CHOL 166 07/22/2020   HDL 53 07/22/2020   LDLCALC 106 03/25/2019   LDLDIRECT 81.3 08/19/2020   TRIG 54 07/22/2020   CHOLHDL 2 09/12/2010    Significant  Diagnostic Results in last 30 days:  No results found.  Assessment/Plan 1. Productive cough - reports yellow, green sputum - rhonchi heard on auscultation - guaifenesin 600 mg po bid x 7 days - encourage hydration  2. Pneumonia of left lower lobe due to Chlamydia species - improved, she reports feeling better - Levaquin completed today - follow up CXR in 6-12 weeks    Family/ staff Communication: plan discussed with patient and nurse  Labs/tests ordered:  none

## 2021-05-03 DIAGNOSIS — M84350S Stress fracture, pelvis, sequela: Secondary | ICD-10-CM | POA: Diagnosis not present

## 2021-05-03 DIAGNOSIS — M6389 Disorders of muscle in diseases classified elsewhere, multiple sites: Secondary | ICD-10-CM | POA: Diagnosis not present

## 2021-05-03 DIAGNOSIS — R278 Other lack of coordination: Secondary | ICD-10-CM | POA: Diagnosis not present

## 2021-05-03 DIAGNOSIS — Z9181 History of falling: Secondary | ICD-10-CM | POA: Diagnosis not present

## 2021-05-03 DIAGNOSIS — I6389 Other cerebral infarction: Secondary | ICD-10-CM | POA: Diagnosis not present

## 2021-05-04 DIAGNOSIS — R278 Other lack of coordination: Secondary | ICD-10-CM | POA: Diagnosis not present

## 2021-05-04 DIAGNOSIS — I6389 Other cerebral infarction: Secondary | ICD-10-CM | POA: Diagnosis not present

## 2021-05-04 DIAGNOSIS — M84350S Stress fracture, pelvis, sequela: Secondary | ICD-10-CM | POA: Diagnosis not present

## 2021-05-04 DIAGNOSIS — Z9181 History of falling: Secondary | ICD-10-CM | POA: Diagnosis not present

## 2021-05-04 DIAGNOSIS — M6389 Disorders of muscle in diseases classified elsewhere, multiple sites: Secondary | ICD-10-CM | POA: Diagnosis not present

## 2021-05-05 DIAGNOSIS — M6389 Disorders of muscle in diseases classified elsewhere, multiple sites: Secondary | ICD-10-CM | POA: Diagnosis not present

## 2021-05-05 DIAGNOSIS — I6389 Other cerebral infarction: Secondary | ICD-10-CM | POA: Diagnosis not present

## 2021-05-05 DIAGNOSIS — M84350S Stress fracture, pelvis, sequela: Secondary | ICD-10-CM | POA: Diagnosis not present

## 2021-05-05 DIAGNOSIS — R278 Other lack of coordination: Secondary | ICD-10-CM | POA: Diagnosis not present

## 2021-05-05 DIAGNOSIS — Z9181 History of falling: Secondary | ICD-10-CM | POA: Diagnosis not present

## 2021-05-10 DIAGNOSIS — M6389 Disorders of muscle in diseases classified elsewhere, multiple sites: Secondary | ICD-10-CM | POA: Diagnosis not present

## 2021-05-10 DIAGNOSIS — Z9181 History of falling: Secondary | ICD-10-CM | POA: Diagnosis not present

## 2021-05-10 DIAGNOSIS — M84350S Stress fracture, pelvis, sequela: Secondary | ICD-10-CM | POA: Diagnosis not present

## 2021-05-10 DIAGNOSIS — R278 Other lack of coordination: Secondary | ICD-10-CM | POA: Diagnosis not present

## 2021-05-10 DIAGNOSIS — I6389 Other cerebral infarction: Secondary | ICD-10-CM | POA: Diagnosis not present

## 2021-05-11 DIAGNOSIS — I6389 Other cerebral infarction: Secondary | ICD-10-CM | POA: Diagnosis not present

## 2021-05-11 DIAGNOSIS — Z9181 History of falling: Secondary | ICD-10-CM | POA: Diagnosis not present

## 2021-05-11 DIAGNOSIS — R278 Other lack of coordination: Secondary | ICD-10-CM | POA: Diagnosis not present

## 2021-05-11 DIAGNOSIS — M6389 Disorders of muscle in diseases classified elsewhere, multiple sites: Secondary | ICD-10-CM | POA: Diagnosis not present

## 2021-05-11 DIAGNOSIS — M84350S Stress fracture, pelvis, sequela: Secondary | ICD-10-CM | POA: Diagnosis not present

## 2021-05-12 DIAGNOSIS — R278 Other lack of coordination: Secondary | ICD-10-CM | POA: Diagnosis not present

## 2021-05-12 DIAGNOSIS — I6389 Other cerebral infarction: Secondary | ICD-10-CM | POA: Diagnosis not present

## 2021-05-12 DIAGNOSIS — M6389 Disorders of muscle in diseases classified elsewhere, multiple sites: Secondary | ICD-10-CM | POA: Diagnosis not present

## 2021-05-12 DIAGNOSIS — M84350S Stress fracture, pelvis, sequela: Secondary | ICD-10-CM | POA: Diagnosis not present

## 2021-05-12 DIAGNOSIS — Z9181 History of falling: Secondary | ICD-10-CM | POA: Diagnosis not present

## 2021-05-13 DIAGNOSIS — I6389 Other cerebral infarction: Secondary | ICD-10-CM | POA: Diagnosis not present

## 2021-05-13 DIAGNOSIS — R278 Other lack of coordination: Secondary | ICD-10-CM | POA: Diagnosis not present

## 2021-05-13 DIAGNOSIS — M84350S Stress fracture, pelvis, sequela: Secondary | ICD-10-CM | POA: Diagnosis not present

## 2021-05-13 DIAGNOSIS — Z9181 History of falling: Secondary | ICD-10-CM | POA: Diagnosis not present

## 2021-05-13 DIAGNOSIS — M6389 Disorders of muscle in diseases classified elsewhere, multiple sites: Secondary | ICD-10-CM | POA: Diagnosis not present

## 2021-05-16 DIAGNOSIS — M6389 Disorders of muscle in diseases classified elsewhere, multiple sites: Secondary | ICD-10-CM | POA: Diagnosis not present

## 2021-05-16 DIAGNOSIS — I6389 Other cerebral infarction: Secondary | ICD-10-CM | POA: Diagnosis not present

## 2021-05-16 DIAGNOSIS — Z9181 History of falling: Secondary | ICD-10-CM | POA: Diagnosis not present

## 2021-05-16 DIAGNOSIS — M84350S Stress fracture, pelvis, sequela: Secondary | ICD-10-CM | POA: Diagnosis not present

## 2021-05-16 DIAGNOSIS — R278 Other lack of coordination: Secondary | ICD-10-CM | POA: Diagnosis not present

## 2021-05-17 DIAGNOSIS — R278 Other lack of coordination: Secondary | ICD-10-CM | POA: Diagnosis not present

## 2021-05-17 DIAGNOSIS — M84350S Stress fracture, pelvis, sequela: Secondary | ICD-10-CM | POA: Diagnosis not present

## 2021-05-17 DIAGNOSIS — M6389 Disorders of muscle in diseases classified elsewhere, multiple sites: Secondary | ICD-10-CM | POA: Diagnosis not present

## 2021-05-17 DIAGNOSIS — I6389 Other cerebral infarction: Secondary | ICD-10-CM | POA: Diagnosis not present

## 2021-05-17 DIAGNOSIS — Z9181 History of falling: Secondary | ICD-10-CM | POA: Diagnosis not present

## 2021-05-18 DIAGNOSIS — M6389 Disorders of muscle in diseases classified elsewhere, multiple sites: Secondary | ICD-10-CM | POA: Diagnosis not present

## 2021-05-18 DIAGNOSIS — Z9181 History of falling: Secondary | ICD-10-CM | POA: Diagnosis not present

## 2021-05-18 DIAGNOSIS — M84350S Stress fracture, pelvis, sequela: Secondary | ICD-10-CM | POA: Diagnosis not present

## 2021-05-18 DIAGNOSIS — R278 Other lack of coordination: Secondary | ICD-10-CM | POA: Diagnosis not present

## 2021-05-18 DIAGNOSIS — I6389 Other cerebral infarction: Secondary | ICD-10-CM | POA: Diagnosis not present

## 2021-05-19 DIAGNOSIS — I6389 Other cerebral infarction: Secondary | ICD-10-CM | POA: Diagnosis not present

## 2021-05-19 DIAGNOSIS — M6389 Disorders of muscle in diseases classified elsewhere, multiple sites: Secondary | ICD-10-CM | POA: Diagnosis not present

## 2021-05-19 DIAGNOSIS — R278 Other lack of coordination: Secondary | ICD-10-CM | POA: Diagnosis not present

## 2021-05-19 DIAGNOSIS — M84350S Stress fracture, pelvis, sequela: Secondary | ICD-10-CM | POA: Diagnosis not present

## 2021-05-19 DIAGNOSIS — Z9181 History of falling: Secondary | ICD-10-CM | POA: Diagnosis not present

## 2021-05-20 DIAGNOSIS — I10 Essential (primary) hypertension: Secondary | ICD-10-CM | POA: Diagnosis not present

## 2021-05-20 LAB — CBC: RBC: 4.1 (ref 3.87–5.11)

## 2021-05-20 LAB — CBC AND DIFFERENTIAL
HCT: 37 (ref 36–46)
Hemoglobin: 12.7 (ref 12.0–16.0)
Platelets: 121 — AB (ref 150–399)
WBC: 5.7

## 2021-05-23 DIAGNOSIS — M6389 Disorders of muscle in diseases classified elsewhere, multiple sites: Secondary | ICD-10-CM | POA: Diagnosis not present

## 2021-05-23 DIAGNOSIS — Z9181 History of falling: Secondary | ICD-10-CM | POA: Diagnosis not present

## 2021-05-23 DIAGNOSIS — I6389 Other cerebral infarction: Secondary | ICD-10-CM | POA: Diagnosis not present

## 2021-05-23 DIAGNOSIS — R278 Other lack of coordination: Secondary | ICD-10-CM | POA: Diagnosis not present

## 2021-05-23 DIAGNOSIS — M84350S Stress fracture, pelvis, sequela: Secondary | ICD-10-CM | POA: Diagnosis not present

## 2021-05-24 DIAGNOSIS — R278 Other lack of coordination: Secondary | ICD-10-CM | POA: Diagnosis not present

## 2021-05-24 DIAGNOSIS — I6389 Other cerebral infarction: Secondary | ICD-10-CM | POA: Diagnosis not present

## 2021-05-24 DIAGNOSIS — M84350S Stress fracture, pelvis, sequela: Secondary | ICD-10-CM | POA: Diagnosis not present

## 2021-05-24 DIAGNOSIS — Z9181 History of falling: Secondary | ICD-10-CM | POA: Diagnosis not present

## 2021-05-24 DIAGNOSIS — M6389 Disorders of muscle in diseases classified elsewhere, multiple sites: Secondary | ICD-10-CM | POA: Diagnosis not present

## 2021-05-25 DIAGNOSIS — M84350S Stress fracture, pelvis, sequela: Secondary | ICD-10-CM | POA: Diagnosis not present

## 2021-05-25 DIAGNOSIS — Z9181 History of falling: Secondary | ICD-10-CM | POA: Diagnosis not present

## 2021-05-25 DIAGNOSIS — R278 Other lack of coordination: Secondary | ICD-10-CM | POA: Diagnosis not present

## 2021-05-25 DIAGNOSIS — M6389 Disorders of muscle in diseases classified elsewhere, multiple sites: Secondary | ICD-10-CM | POA: Diagnosis not present

## 2021-05-25 DIAGNOSIS — I6389 Other cerebral infarction: Secondary | ICD-10-CM | POA: Diagnosis not present

## 2021-05-26 DIAGNOSIS — Z9181 History of falling: Secondary | ICD-10-CM | POA: Diagnosis not present

## 2021-05-26 DIAGNOSIS — R278 Other lack of coordination: Secondary | ICD-10-CM | POA: Diagnosis not present

## 2021-05-26 DIAGNOSIS — I6389 Other cerebral infarction: Secondary | ICD-10-CM | POA: Diagnosis not present

## 2021-05-26 DIAGNOSIS — M6389 Disorders of muscle in diseases classified elsewhere, multiple sites: Secondary | ICD-10-CM | POA: Diagnosis not present

## 2021-05-26 DIAGNOSIS — M84350S Stress fracture, pelvis, sequela: Secondary | ICD-10-CM | POA: Diagnosis not present

## 2021-05-27 DIAGNOSIS — M84350S Stress fracture, pelvis, sequela: Secondary | ICD-10-CM | POA: Diagnosis not present

## 2021-05-27 DIAGNOSIS — M6389 Disorders of muscle in diseases classified elsewhere, multiple sites: Secondary | ICD-10-CM | POA: Diagnosis not present

## 2021-05-27 DIAGNOSIS — R278 Other lack of coordination: Secondary | ICD-10-CM | POA: Diagnosis not present

## 2021-05-27 DIAGNOSIS — I6389 Other cerebral infarction: Secondary | ICD-10-CM | POA: Diagnosis not present

## 2021-05-27 DIAGNOSIS — Z9181 History of falling: Secondary | ICD-10-CM | POA: Diagnosis not present

## 2021-05-30 ENCOUNTER — Non-Acute Institutional Stay (SKILLED_NURSING_FACILITY): Payer: Medicare Other | Admitting: Internal Medicine

## 2021-05-30 ENCOUNTER — Encounter: Payer: Self-pay | Admitting: Internal Medicine

## 2021-05-30 DIAGNOSIS — M6389 Disorders of muscle in diseases classified elsewhere, multiple sites: Secondary | ICD-10-CM | POA: Diagnosis not present

## 2021-05-30 DIAGNOSIS — D696 Thrombocytopenia, unspecified: Secondary | ICD-10-CM

## 2021-05-30 DIAGNOSIS — M1711 Unilateral primary osteoarthritis, right knee: Secondary | ICD-10-CM | POA: Diagnosis not present

## 2021-05-30 DIAGNOSIS — I4821 Permanent atrial fibrillation: Secondary | ICD-10-CM | POA: Diagnosis not present

## 2021-05-30 DIAGNOSIS — F419 Anxiety disorder, unspecified: Secondary | ICD-10-CM | POA: Diagnosis not present

## 2021-05-30 DIAGNOSIS — I6389 Other cerebral infarction: Secondary | ICD-10-CM | POA: Diagnosis not present

## 2021-05-30 DIAGNOSIS — I10 Essential (primary) hypertension: Secondary | ICD-10-CM | POA: Diagnosis not present

## 2021-05-30 DIAGNOSIS — R278 Other lack of coordination: Secondary | ICD-10-CM | POA: Diagnosis not present

## 2021-05-30 DIAGNOSIS — R1312 Dysphagia, oropharyngeal phase: Secondary | ICD-10-CM

## 2021-05-30 DIAGNOSIS — I69354 Hemiplegia and hemiparesis following cerebral infarction affecting left non-dominant side: Secondary | ICD-10-CM | POA: Diagnosis not present

## 2021-05-30 DIAGNOSIS — M84350S Stress fracture, pelvis, sequela: Secondary | ICD-10-CM | POA: Diagnosis not present

## 2021-05-30 DIAGNOSIS — Z9181 History of falling: Secondary | ICD-10-CM | POA: Diagnosis not present

## 2021-05-30 NOTE — Progress Notes (Signed)
Location:   [facility] Room Number: 118 Place of Service:  SNF [phone]) Provider:  Veleta Miners MD   [name], MD  Patient Care Team: [name], MD as PCP - General (Internal Medicine) [name], MD as PCP - Electrophysiology (Cardiology) [name], MD as Consulting Physician (Dermatology) [name], MD as Consulting Physician (Cardiology) [facility] [name], MD as Consulting Physician (Dermatology)  Extended Emergency Contact Information Primary Emergency Contact: [name] of [place] Phone: [phone] Mobile Phone: [phone] Relation: Daughter Secondary Emergency Contact: [name],[name]  [name] of [place] Phone: [phone] Relation: Son  Code Status:  DNR Goals of care: Advanced Directive information Advanced Directives 05/30/2021  Does Patient Have a Medical Advance Directive? Yes  Type of Paramedic of [place];Living will;Out of facility DNR (pink MOST or yellow form)  Does patient want to make changes to medical advance directive? No - Patient declined  Copy of [place] in Chart? Yes - validated most recent copy scanned in chart (See row information)  Pre-existing out of facility DNR order (yellow form or pink MOST form) -     Chief Complaint  Patient presents with   Medical Management of Chronic Issues   Health Maintenance    TDAP and #4 covid     HPI:  Pt is a 87 y.o. female seen today for medical management of chronic diseases.    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  With  extraperitoneal hemorrhage in 10/21 Patient was  off Eliquis before her stroke due to ruptured Baker's cyst with hemorrhage before her embolic stroke  She continues to make slow Progress Now walking with Assist and her cane Continues to work with Therapy  Just recovered from her Pneumonia in 6/13 No Other issues today Weight is stable  Past Medical History:  Diagnosis Date   Actinic keratosis 05/25/2014   Anxiety    Atrial fibrillation ([place]) 09/21/2010   Basal cell carcinoma 05/25/2014   Multiple removed by Dr. Danella Sensing in March 2015: right neck, left neck, scalp    Bell's palsy 07/18/1979   Cervicalgia 01/31/2012   Closed fracture of lumbar vertebra without mention of spinal cord injury 07/17/2005   Conjunctiva disorder 12/26/2010   Coronary atherosclerosis of native coronary artery 07/17/1997   Cramp of limb 08/16/2009   Degeneration of lumbar or lumbosacral intervertebral disc 01/31/2012   Disturbance of skin sensation 08/2009   Dizziness and giddiness 11/08/2011   vertigo   External hemorrhoids without mention of complication [id number]   External hemorrhoids without mention of complication [id number]   Ganglion of tendon sheath 08/16/2009   Leg cramp 10/27/2013   Most frequently the right leg.    Long term (current) use of anticoagulants 09/2010   Lumbago 07/2009   Meralgia paresthetica 07/2007   MI, old    Myalgia and myositis, unspecified 01/17/2012   Osteoporosis    Other abnormal blood chemistry 04/08/2012   Other abnormal blood chemistry 2013   hyperglycemia   Other disorder of muscle, ligament, and fascia 04/08/2012   Other specified cardiac dysrhythmias(427.89) 06/27/2010   Pacemaker 05/06/2020   Pain in joint, ankle and  foot 10/27/2013   Bilateral since 1998    Pain in joint, shoulder region 08/12/2012   Pain in joint, upper arm 12/26/2010   Pain in limb 01/11/2011   Pain in thoracic spine 01/31/2012   Pathologic fracture of vertebrae 01/22/2012   PVC's (premature  ventricular contractions)    Rash and other nonspecific skin eruption 08/02/2011   Senile osteoporosis 07/17/1993   Stroke ([place])    Unspecified essential hypertension 07/17/1997   Varicose veins of lower extremities 08/16/2009   Varicose veins of lower extremities 08/16/2009   Past Surgical History:  Procedure Laterality Date   COLONOSCOPY WITH PROPOFOL N/A 10/19/2015   Procedure: COLONOSCOPY WITH PROPOFOL;  Surgeon: Gatha Mayer, MD;  Location: [facility];  Service: Endoscopy;  Laterality: N/A;   CORONARY ARTERY BYPASS GRAFT  1998   x2; LIMA to LAD; SVG to diagonal off bypass   HEMORRHOID SURGERY  08/26/2012   Dr. Brantley Stage   LOOP RECORDER INSERTION N/A 05/13/2018   Procedure: LOOP RECORDER INSERTION;  Surgeon: [name], MD;  Location: [facility];  Service: Cardiovascular;  Laterality: N/A;   LOOP RECORDER REMOVAL N/A 05/26/2019   Procedure: LOOP RECORDER REMOVAL;  Surgeon: [name], MD;  Location: [facility];  Service: Cardiovascular;  Laterality: N/A;   MASS EXCISION Left 11/23/2015   Procedure: LEFT WRIST EXCISION CYST;  Surgeon: Leanora Cover, MD;  Location: [facility];  Service: Orthopedics;  Laterality: Left;   PACEMAKER IMPLANT N/A 05/26/2019   Procedure: PACEMAKER IMPLANT;  Surgeon: [name], MD;  Location: [facility];  Service: Cardiovascular;  Laterality: N/A;   SKIN BIOPSY  01/29/14   (R) neck; (R) scalp, 2 (L) neck; shave biopsy Superficial basal cell carcinoma Dr. Danella Sensing   TONSILLECTOMY  [phone]    Allergies  Allergen Reactions   Iodinated Diagnostic Agents Nausea Only and Other (See Comments)    Severe nausea and also passed out   Iodine    Other     Seasonal Allergies.   Bactrim Rash   Relafen [Nabumetone] Rash   Sulfanilamide Rash    Allergies as of 05/30/2021       Reactions   Iodinated Diagnostic Agents Nausea Only, Other (See Comments)   Severe nausea and also passed out   Iodine    Other     Seasonal Allergies.   Bactrim Rash   Relafen [nabumetone] Rash   Sulfanilamide Rash        Medication List        Accurate as of May 30, 2021 11:37 AM. If you have any questions, ask your nurse or doctor.          acetaminophen 500 MG tablet Commonly known as: TYLENOL Take 1,000 mg by mouth every 6 (six) hours as needed for mild pain (pain.).   amLODipine 5 MG tablet Commonly known as: NORVASC Take 5 mg by mouth daily.   aspirin 81 MG EC tablet Take 1 tablet (81 mg total) by mouth daily. Swallow whole.   atenolol 50 MG tablet Commonly known as: TENORMIN Take 50 mg by mouth 2 (two) times daily.   calcium-vitamin D 500-200 MG-UNIT tablet Commonly known as: OSCAL WITH D Take 1 tablet by mouth daily.   diclofenac Sodium 1 % Gel Commonly known as: VOLTAREN Apply 4 g topically 4 (four) times daily as needed. Left knee, right knee, left ankle   digoxin 0.125 MG tablet Commonly known as: LANOXIN Take 0.125 mg by  mouth every other day.   hydrALAZINE 10 MG tablet Commonly known as: APRESOLINE Take 10 mg by mouth 3 (three) times daily as needed. TID PRN for SBP >170   hydrochlorothiazide 50 MG tablet Commonly known as: HYDRODIURIL Take 50 mg by mouth daily.   LORazepam 1 MG tablet Commonly known as: ATIVAN Take 1 tablet (1 mg total) by mouth at bedtime.   ondansetron 4 MG tablet Commonly known as: ZOFRAN Take 1 tablet (4 mg total) by mouth every 6 (six) hours as needed for nausea.   polyethylene glycol 17 g packet Commonly known as: MIRALAX / GLYCOLAX Take 17 g by mouth every other day.   potassium chloride SA 20 MEQ tablet Commonly known as: KLOR-CON Take 20 mEq by mouth daily.   Tea Tree Oil Oil Apply 1-2 drops topically 2 (two) times daily.   traMADol 50 MG tablet Commonly known as: ULTRAM Take 1 tablet (50 mg total) by mouth every 6 (six) hours as needed for moderate pain or severe pain.        Review of Systems Review of Systems   Constitutional: Negative for activity change, appetite change, chills, diaphoresis, fatigue and fever.  HENT: Negative for mouth sores, postnasal drip, rhinorrhea, sinus pain and sore throat.   Respiratory: Negative for apnea, cough, chest tightness, shortness of breath and wheezing.   Cardiovascular: Negative for chest pain, palpitations and leg swelling.  Gastrointestinal: Negative for abdominal distention, abdominal pain, constipation, diarrhea, nausea and vomiting.  Genitourinary: Negative for dysuria and frequency.  Musculoskeletal: Negative for arthralgias, joint swelling and myalgias.  Skin: Negative for rash.  Neurological: Negative for dizziness, syncope, weakness, light-headedness and numbness.  Psychiatric/Behavioral: Negative for behavioral problems, confusion and sleep disturbance.    Immunization History  Administered Date(s) Administered   Influenza Whole 08/06/2012   Influenza, High Dose Seasonal PF 08/15/2019, 08/27/2020   Influenza,inj,Quad PF,6+ Mos 08/30/2018   Influenza-Unspecified 08/06/2013, 08/24/2014, 08/26/2015, 08/31/2016, 08/27/2017, 08/27/2020   Moderna Sars-Covid-2 Vaccination 11/16/2019, 12/16/2019, 09/16/2020   Pneumococcal Conjugate-13 09/22/2015   Pneumococcal Polysaccharide-23 08/29/2006   Td 10/07/2007   Zoster Recombinat (Shingrix) 02/12/2018, 05/13/2018   Zoster, Live 03/12/2008   Pertinent  Health Maintenance Due  Topic Date Due   INFLUENZA VACCINE  06/06/2021   DEXA SCAN  Completed   PNA vac Low Risk Adult  Completed   Fall Risk  09/23/2020 08/26/2020 07/28/2020 02/25/2020 01/28/2020  Falls in the past year? 1 1 0 0 0  Number falls in past yr: 0 0 1 0 0  Comment - - - - -  Injury with Fall? 1 1 0 0 0  Comment Patient fractured pelvis and had a stroke - - - -  Risk for fall due to : - History of fall(s);Impaired balance/gait;Impaired mobility;Orthopedic patient - - -  Follow up - Falls evaluation completed;Education provided;Falls prevention  discussed;Follow up appointment - - -   Functional Status Survey:    Vitals:   05/30/21 1103  BP: 129/77  Pulse: 85  Resp: 18  Temp: (!) 96.5 F (35.8 C)  SpO2: 98%  Weight: 126 lb 12.8 oz (57.5 kg)  Height: '5\' 6"'$  (1.676 m)   Body mass index is 20.47 kg/m. Physical Exam Constitutional: Oriented to person, place, and time. Well-developed and well-nourished.  HENT:  Head: Normocephalic.  Mouth/Throat: Oropharynx is clear and moist.  Eyes: Pupils are equal, round, and reactive to light.  Neck: Neck supple.  Cardiovascular: Normal rate and normal heart sounds.  No murmur heard. Pulmonary/Chest: Effort normal  and breath sounds normal. No respiratory distress. No wheezes. She has no rales.  Abdominal: Soft. Bowel sounds are normal. No distension. There is no tenderness. There is no rebound.  Musculoskeletal: No edema.  Lymphadenopathy: none Neurological: Alert and oriented to person, place, and time. Left Flaccid Hemiparesis Skin: Skin is warm and dry.  Psychiatric: Normal mood and affect. Behavior is normal. Thought content normal.   Labs reviewed: Recent Labs    08/17/20 0731 08/18/20 0255 10/12/20 0000 03/15/21 0000 04/17/21 0000 04/25/21 0000  NA 142 137   < > 134* 134* 135*  K 3.7 3.9   < > 4.0 3.3* 4.0  CL 105 106   < > 95* 94* 91*  CO2 24 23   < > 29* 30* 32*  GLUCOSE 112* 134*  --   --   --   --   BUN 24* 21   < > 26* 26* 23*  CREATININE 0.89 1.01*   < > 0.9 1.1 1.1  CALCIUM 9.3 8.7*   < > 9.2 9.0 9.8   < > = values in this interval not displayed.   Recent Labs    08/17/20 0731 08/18/20 0255  AST 32 26  ALT 24 21  ALKPHOS 62 53  BILITOT 1.2 1.2  PROT 6.5 6.1*  ALBUMIN 3.7 3.4*   Recent Labs    08/17/20 0731 08/17/20 1322 08/18/20 0255 08/23/20 0521 08/23/20 1621 08/30/20 0000 10/12/20 0000 03/22/21 0000 04/17/21 0000 05/20/21 0000  WBC 15.5* 12.7* 15.9* 16.9*  --  13.9   < > 5.4 8.3 5.7  NEUTROABS 11.9* 11.1*  --   --   --  10.40  --    --   --   --   HGB 11.2* 9.7* 9.1* 7.3*   < > 9.6*   < > 13.4 12.9 12.7  HCT 35.0* 31.3* 29.2* 24.1*   < > 29*   < > 40 38 37  MCV 92.3 93.4 94.5 97.6  --   --   --   --   --   --   PLT 84* 81* 81* 138*  --  197   < > 126* 92* 121*   < > = values in this interval not displayed.   Lab Results  Component Value Date   TSH 2.48 03/22/2021   Lab Results  Component Value Date   HGBA1C 5.7 (H) 08/19/2020   Lab Results  Component Value Date   CHOL 166 07/22/2020   HDL 53 07/22/2020   LDLCALC 106 03/25/2019   LDLDIRECT 81.3 08/19/2020   TRIG 54 07/22/2020   CHOLHDL 2 09/12/2010    Significant Diagnostic Results in last 30 days:  No results found.  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 Neurology oked it She is on Aspirin Continues to be wheelchair dependent and one assist is dependent for transfers No Falls Will d/w Cardiology also for restarting statin Will check Lipid panel in few months Permanent atrial fibrillation ([place]) On Toprol Digoxin Awaiting Cardiology Input for her Eliquis Oropharyngeal dysphagia On Regular Diet now Thrombocytopenia ([place]) Platelets Stable Will repeat in 4 months per her request Arthritis of right knee Pain controlled on Tramadol Anxiety On Ativan at night Atrioventricular block, complete [facility]) S/p pacemaker Family/ staff Communication:   Labs/tests ordered:  BMP,CBC,Lipid panel in months

## 2021-05-31 DIAGNOSIS — I6389 Other cerebral infarction: Secondary | ICD-10-CM | POA: Diagnosis not present

## 2021-05-31 DIAGNOSIS — M84350S Stress fracture, pelvis, sequela: Secondary | ICD-10-CM | POA: Diagnosis not present

## 2021-05-31 DIAGNOSIS — R278 Other lack of coordination: Secondary | ICD-10-CM | POA: Diagnosis not present

## 2021-05-31 DIAGNOSIS — Z9181 History of falling: Secondary | ICD-10-CM | POA: Diagnosis not present

## 2021-05-31 DIAGNOSIS — M6389 Disorders of muscle in diseases classified elsewhere, multiple sites: Secondary | ICD-10-CM | POA: Diagnosis not present

## 2021-06-01 DIAGNOSIS — M6389 Disorders of muscle in diseases classified elsewhere, multiple sites: Secondary | ICD-10-CM | POA: Diagnosis not present

## 2021-06-01 DIAGNOSIS — M84350S Stress fracture, pelvis, sequela: Secondary | ICD-10-CM | POA: Diagnosis not present

## 2021-06-01 DIAGNOSIS — R278 Other lack of coordination: Secondary | ICD-10-CM | POA: Diagnosis not present

## 2021-06-01 DIAGNOSIS — Z9181 History of falling: Secondary | ICD-10-CM | POA: Diagnosis not present

## 2021-06-01 DIAGNOSIS — I6389 Other cerebral infarction: Secondary | ICD-10-CM | POA: Diagnosis not present

## 2021-06-02 DIAGNOSIS — M84350S Stress fracture, pelvis, sequela: Secondary | ICD-10-CM | POA: Diagnosis not present

## 2021-06-02 DIAGNOSIS — M6389 Disorders of muscle in diseases classified elsewhere, multiple sites: Secondary | ICD-10-CM | POA: Diagnosis not present

## 2021-06-02 DIAGNOSIS — I6389 Other cerebral infarction: Secondary | ICD-10-CM | POA: Diagnosis not present

## 2021-06-02 DIAGNOSIS — R278 Other lack of coordination: Secondary | ICD-10-CM | POA: Diagnosis not present

## 2021-06-02 DIAGNOSIS — Z9181 History of falling: Secondary | ICD-10-CM | POA: Diagnosis not present

## 2021-06-03 ENCOUNTER — Ambulatory Visit (INDEPENDENT_AMBULATORY_CARE_PROVIDER_SITE_OTHER): Payer: Medicare Other

## 2021-06-03 DIAGNOSIS — I442 Atrioventricular block, complete: Secondary | ICD-10-CM | POA: Diagnosis not present

## 2021-06-03 DIAGNOSIS — I6389 Other cerebral infarction: Secondary | ICD-10-CM | POA: Diagnosis not present

## 2021-06-03 DIAGNOSIS — M84350S Stress fracture, pelvis, sequela: Secondary | ICD-10-CM | POA: Diagnosis not present

## 2021-06-03 DIAGNOSIS — R278 Other lack of coordination: Secondary | ICD-10-CM | POA: Diagnosis not present

## 2021-06-03 DIAGNOSIS — Z9181 History of falling: Secondary | ICD-10-CM | POA: Diagnosis not present

## 2021-06-03 DIAGNOSIS — M6389 Disorders of muscle in diseases classified elsewhere, multiple sites: Secondary | ICD-10-CM | POA: Diagnosis not present

## 2021-06-04 LAB — CUP PACEART REMOTE DEVICE CHECK
Battery Remaining Longevity: 119 mo
Battery Remaining Percentage: 88 %
Battery Voltage: 3.02 V
Brady Statistic RV Percent Paced: 4.3 %
Date Time Interrogation Session: 20220729020015
Implantable Lead Implant Date: 20200720
Implantable Lead Location: 753860
Implantable Pulse Generator Implant Date: 20200720
Lead Channel Impedance Value: 540 Ohm
Lead Channel Pacing Threshold Amplitude: 1 V
Lead Channel Pacing Threshold Pulse Width: 0.5 ms
Lead Channel Sensing Intrinsic Amplitude: 6.4 mV
Lead Channel Setting Pacing Amplitude: 2.5 V
Lead Channel Setting Pacing Pulse Width: 0.5 ms
Lead Channel Setting Sensing Sensitivity: 2 mV
Pulse Gen Model: 1272
Pulse Gen Serial Number: 9122450

## 2021-06-06 DIAGNOSIS — M6389 Disorders of muscle in diseases classified elsewhere, multiple sites: Secondary | ICD-10-CM | POA: Diagnosis not present

## 2021-06-06 DIAGNOSIS — Z9181 History of falling: Secondary | ICD-10-CM | POA: Diagnosis not present

## 2021-06-06 DIAGNOSIS — I6389 Other cerebral infarction: Secondary | ICD-10-CM | POA: Diagnosis not present

## 2021-06-06 DIAGNOSIS — R278 Other lack of coordination: Secondary | ICD-10-CM | POA: Diagnosis not present

## 2021-06-06 DIAGNOSIS — M84350S Stress fracture, pelvis, sequela: Secondary | ICD-10-CM | POA: Diagnosis not present

## 2021-06-07 DIAGNOSIS — Z9181 History of falling: Secondary | ICD-10-CM | POA: Diagnosis not present

## 2021-06-07 DIAGNOSIS — M84350S Stress fracture, pelvis, sequela: Secondary | ICD-10-CM | POA: Diagnosis not present

## 2021-06-07 DIAGNOSIS — M6389 Disorders of muscle in diseases classified elsewhere, multiple sites: Secondary | ICD-10-CM | POA: Diagnosis not present

## 2021-06-07 DIAGNOSIS — R278 Other lack of coordination: Secondary | ICD-10-CM | POA: Diagnosis not present

## 2021-06-07 DIAGNOSIS — I6389 Other cerebral infarction: Secondary | ICD-10-CM | POA: Diagnosis not present

## 2021-06-08 DIAGNOSIS — I6389 Other cerebral infarction: Secondary | ICD-10-CM | POA: Diagnosis not present

## 2021-06-08 DIAGNOSIS — Z9181 History of falling: Secondary | ICD-10-CM | POA: Diagnosis not present

## 2021-06-08 DIAGNOSIS — M84350S Stress fracture, pelvis, sequela: Secondary | ICD-10-CM | POA: Diagnosis not present

## 2021-06-08 DIAGNOSIS — R278 Other lack of coordination: Secondary | ICD-10-CM | POA: Diagnosis not present

## 2021-06-08 DIAGNOSIS — M6389 Disorders of muscle in diseases classified elsewhere, multiple sites: Secondary | ICD-10-CM | POA: Diagnosis not present

## 2021-06-09 DIAGNOSIS — M6389 Disorders of muscle in diseases classified elsewhere, multiple sites: Secondary | ICD-10-CM | POA: Diagnosis not present

## 2021-06-09 DIAGNOSIS — Z9181 History of falling: Secondary | ICD-10-CM | POA: Diagnosis not present

## 2021-06-09 DIAGNOSIS — R278 Other lack of coordination: Secondary | ICD-10-CM | POA: Diagnosis not present

## 2021-06-09 DIAGNOSIS — I6389 Other cerebral infarction: Secondary | ICD-10-CM | POA: Diagnosis not present

## 2021-06-09 DIAGNOSIS — M84350S Stress fracture, pelvis, sequela: Secondary | ICD-10-CM | POA: Diagnosis not present

## 2021-06-10 DIAGNOSIS — Z9181 History of falling: Secondary | ICD-10-CM | POA: Diagnosis not present

## 2021-06-10 DIAGNOSIS — R278 Other lack of coordination: Secondary | ICD-10-CM | POA: Diagnosis not present

## 2021-06-10 DIAGNOSIS — I6389 Other cerebral infarction: Secondary | ICD-10-CM | POA: Diagnosis not present

## 2021-06-10 DIAGNOSIS — M6389 Disorders of muscle in diseases classified elsewhere, multiple sites: Secondary | ICD-10-CM | POA: Diagnosis not present

## 2021-06-10 DIAGNOSIS — M84350S Stress fracture, pelvis, sequela: Secondary | ICD-10-CM | POA: Diagnosis not present

## 2021-06-13 DIAGNOSIS — M84350S Stress fracture, pelvis, sequela: Secondary | ICD-10-CM | POA: Diagnosis not present

## 2021-06-13 DIAGNOSIS — Z9181 History of falling: Secondary | ICD-10-CM | POA: Diagnosis not present

## 2021-06-13 DIAGNOSIS — M6389 Disorders of muscle in diseases classified elsewhere, multiple sites: Secondary | ICD-10-CM | POA: Diagnosis not present

## 2021-06-13 DIAGNOSIS — R278 Other lack of coordination: Secondary | ICD-10-CM | POA: Diagnosis not present

## 2021-06-13 DIAGNOSIS — I6389 Other cerebral infarction: Secondary | ICD-10-CM | POA: Diagnosis not present

## 2021-06-14 DIAGNOSIS — M84350S Stress fracture, pelvis, sequela: Secondary | ICD-10-CM | POA: Diagnosis not present

## 2021-06-14 DIAGNOSIS — I6389 Other cerebral infarction: Secondary | ICD-10-CM | POA: Diagnosis not present

## 2021-06-14 DIAGNOSIS — M6389 Disorders of muscle in diseases classified elsewhere, multiple sites: Secondary | ICD-10-CM | POA: Diagnosis not present

## 2021-06-14 DIAGNOSIS — Z9181 History of falling: Secondary | ICD-10-CM | POA: Diagnosis not present

## 2021-06-14 DIAGNOSIS — R278 Other lack of coordination: Secondary | ICD-10-CM | POA: Diagnosis not present

## 2021-06-15 DIAGNOSIS — Z9181 History of falling: Secondary | ICD-10-CM | POA: Diagnosis not present

## 2021-06-15 DIAGNOSIS — R278 Other lack of coordination: Secondary | ICD-10-CM | POA: Diagnosis not present

## 2021-06-15 DIAGNOSIS — M84350S Stress fracture, pelvis, sequela: Secondary | ICD-10-CM | POA: Diagnosis not present

## 2021-06-15 DIAGNOSIS — M6389 Disorders of muscle in diseases classified elsewhere, multiple sites: Secondary | ICD-10-CM | POA: Diagnosis not present

## 2021-06-15 DIAGNOSIS — I6389 Other cerebral infarction: Secondary | ICD-10-CM | POA: Diagnosis not present

## 2021-06-16 ENCOUNTER — Ambulatory Visit: Payer: Medicare Other | Admitting: Adult Health

## 2021-06-16 DIAGNOSIS — M84350S Stress fracture, pelvis, sequela: Secondary | ICD-10-CM | POA: Diagnosis not present

## 2021-06-16 DIAGNOSIS — R278 Other lack of coordination: Secondary | ICD-10-CM | POA: Diagnosis not present

## 2021-06-16 DIAGNOSIS — M6389 Disorders of muscle in diseases classified elsewhere, multiple sites: Secondary | ICD-10-CM | POA: Diagnosis not present

## 2021-06-16 DIAGNOSIS — Z9181 History of falling: Secondary | ICD-10-CM | POA: Diagnosis not present

## 2021-06-16 DIAGNOSIS — I6389 Other cerebral infarction: Secondary | ICD-10-CM | POA: Diagnosis not present

## 2021-06-17 DIAGNOSIS — R278 Other lack of coordination: Secondary | ICD-10-CM | POA: Diagnosis not present

## 2021-06-17 DIAGNOSIS — I6389 Other cerebral infarction: Secondary | ICD-10-CM | POA: Diagnosis not present

## 2021-06-17 DIAGNOSIS — M6389 Disorders of muscle in diseases classified elsewhere, multiple sites: Secondary | ICD-10-CM | POA: Diagnosis not present

## 2021-06-17 DIAGNOSIS — Z9181 History of falling: Secondary | ICD-10-CM | POA: Diagnosis not present

## 2021-06-17 DIAGNOSIS — M84350S Stress fracture, pelvis, sequela: Secondary | ICD-10-CM | POA: Diagnosis not present

## 2021-06-20 DIAGNOSIS — M6389 Disorders of muscle in diseases classified elsewhere, multiple sites: Secondary | ICD-10-CM | POA: Diagnosis not present

## 2021-06-20 DIAGNOSIS — Z9181 History of falling: Secondary | ICD-10-CM | POA: Diagnosis not present

## 2021-06-20 DIAGNOSIS — M84350S Stress fracture, pelvis, sequela: Secondary | ICD-10-CM | POA: Diagnosis not present

## 2021-06-20 DIAGNOSIS — R278 Other lack of coordination: Secondary | ICD-10-CM | POA: Diagnosis not present

## 2021-06-20 DIAGNOSIS — I6389 Other cerebral infarction: Secondary | ICD-10-CM | POA: Diagnosis not present

## 2021-06-21 ENCOUNTER — Non-Acute Institutional Stay (SKILLED_NURSING_FACILITY): Payer: Medicare Other | Admitting: Orthopedic Surgery

## 2021-06-21 ENCOUNTER — Encounter: Payer: Self-pay | Admitting: Orthopedic Surgery

## 2021-06-21 DIAGNOSIS — Z9181 History of falling: Secondary | ICD-10-CM | POA: Diagnosis not present

## 2021-06-21 DIAGNOSIS — I442 Atrioventricular block, complete: Secondary | ICD-10-CM

## 2021-06-21 DIAGNOSIS — I69354 Hemiplegia and hemiparesis following cerebral infarction affecting left non-dominant side: Secondary | ICD-10-CM

## 2021-06-21 DIAGNOSIS — I4821 Permanent atrial fibrillation: Secondary | ICD-10-CM | POA: Diagnosis not present

## 2021-06-21 DIAGNOSIS — K5901 Slow transit constipation: Secondary | ICD-10-CM

## 2021-06-21 DIAGNOSIS — I6389 Other cerebral infarction: Secondary | ICD-10-CM | POA: Diagnosis not present

## 2021-06-21 DIAGNOSIS — B351 Tinea unguium: Secondary | ICD-10-CM

## 2021-06-21 DIAGNOSIS — I10 Essential (primary) hypertension: Secondary | ICD-10-CM | POA: Diagnosis not present

## 2021-06-21 DIAGNOSIS — M1711 Unilateral primary osteoarthritis, right knee: Secondary | ICD-10-CM | POA: Diagnosis not present

## 2021-06-21 DIAGNOSIS — R278 Other lack of coordination: Secondary | ICD-10-CM | POA: Diagnosis not present

## 2021-06-21 DIAGNOSIS — M84350S Stress fracture, pelvis, sequela: Secondary | ICD-10-CM | POA: Diagnosis not present

## 2021-06-21 DIAGNOSIS — R1312 Dysphagia, oropharyngeal phase: Secondary | ICD-10-CM

## 2021-06-21 DIAGNOSIS — M6389 Disorders of muscle in diseases classified elsewhere, multiple sites: Secondary | ICD-10-CM | POA: Diagnosis not present

## 2021-06-21 NOTE — Progress Notes (Signed)
Location:  [facility] Room Number: 118-A Place of Service:  SNF (31) Provider:  Yvonna Alanis, NP    Patient Care Team: [name], MD as PCP - General (Internal Medicine) [name], MD as PCP - Electrophysiology (Cardiology) [name], MD as Consulting Physician (Dermatology) [name], MD as Consulting Physician (Cardiology) [name], MD as Consulting Physician (Dermatology)  Extended Emergency Contact Information Primary Emergency Contact: [name] of [place] Phone: [phone] Mobile Phone: [phone] Relation: Daughter Secondary Emergency Contact: [name],[name]  [name] of [place] Phone: [phone] Relation: Son  Code Status:  DNR Goals of care: Advanced Directive information Advanced Directives 06/21/2021  Does Patient Have a Medical Advance Directive? Yes  Type of Paramedic of [place];Living will;Out of facility DNR (pink MOST or yellow form)  Does patient want to make changes to medical advance directive? No - Patient declined  Copy of [place] in Chart? Yes - validated most recent copy scanned in chart (See row information)  Pre-existing out of facility DNR order (yellow form or pink MOST form) Yellow form placed in chart (order not valid for inpatient use);Pink MOST form placed in chart (order not valid for inpatient use)     Chief Complaint  Patient presents with   Medical Management of Chronic Issues    Routine visit and discuss need for td/tdap, covid, and flu vaccines or exclude.     HPI:  Pt is a 87 y.o. female seen today for medical management of chronic diseases.    She currently resides on the skilled nursing unit at [facility]. Past medical history includes: atrial fibrillation, AV block, CAD, hypertension, GERD, stroke, left sided paralysis, constipation, and dysphagia.    Onychomcosis- Left great toe and second toe remain thick and yellow, she has tried tea tree oil applications without success, she is requesting to See Dr. Jacqualyn Posey at [facility] to discuss treatment options.  Left sided weakness- continues to work with OT, she is able to lift her left elbow and straighten left pointer finger. Ambulates with PWW most of day. She will ambulate with FWW about 4-5 times a week.  Right knee pain- intermittent, last episode a few days ago, pain relieved with prn tramadol, tylenol and voltaren gel prn HTN- BUN/creat 23/1.1 04/25/2021, controlled with norvasc, toprol and HCTZ AV block- s/p pacemaker Atrial fib- rate controlled with toprolol and digoxin, asa for clot prevention Oropharyngeal dysphagia- no recent aspirations, no dietary precautions Constipation- LBM today, miralax every other day  She is scheduled to see dentist for cracked crown December 2022. Denies dental pain or sores.   No recent falls, injuries or behavioral outbursts.   Recent blood pressures:  08/16- 138/79  08/15- 132/81  08/14- 159/78  08/13- 131/82  Recent weights:  08/01- 127 lbs  07/01- 126.8 lbs  06/01- 126.6 lbs  Nurse does not report any concerns, vitals stable.     Past Medical History:  Diagnosis Date   Actinic keratosis 05/25/2014   Anxiety    Atrial fibrillation ([place]) 09/21/2010   Basal cell carcinoma 05/25/2014   Multiple removed by Dr. Danella Sensing in March 2015: right neck, left neck, scalp    Bell's palsy 07/18/1979   Cervicalgia 01/31/2012   Closed fracture of lumbar vertebra without mention of spinal cord injury 07/17/2005   Conjunctiva disorder 12/26/2010   Coronary atherosclerosis of native coronary artery 07/17/1997   Cramp  of limb 08/16/2009   Degeneration of lumbar or lumbosacral intervertebral disc 01/31/2012   Disturbance of skin sensation 08/2009   Dizziness and giddiness 11/08/2011   vertigo   External hemorrhoids without mention of  complication [id number]   External hemorrhoids without mention of complication [id number]   Ganglion of tendon sheath 08/16/2009   Leg cramp 10/27/2013   Most frequently the right leg.    Long term (current) use of anticoagulants 09/2010   Lumbago 07/2009   Meralgia paresthetica 07/2007   MI, old    Myalgia and myositis, unspecified 01/17/2012   Osteoporosis    Other abnormal blood chemistry 04/08/2012   Other abnormal blood chemistry 2013   hyperglycemia   Other disorder of muscle, ligament, and fascia 04/08/2012   Other specified cardiac dysrhythmias(427.89) 06/27/2010   Pacemaker 05/06/2020   Pain in joint, ankle and foot 10/27/2013   Bilateral since 1998    Pain in joint, shoulder region 08/12/2012   Pain in joint, upper arm 12/26/2010   Pain in limb 01/11/2011   Pain in thoracic spine 01/31/2012   Pathologic fracture of vertebrae 01/22/2012   PVC's (premature ventricular contractions)    Rash and other nonspecific skin eruption 08/02/2011   Senile osteoporosis 07/17/1993   Stroke ([place])    Unspecified essential hypertension 07/17/1997   Varicose veins of lower extremities 08/16/2009   Varicose veins of lower extremities 08/16/2009   Past Surgical History:  Procedure Laterality Date   COLONOSCOPY WITH PROPOFOL N/A 10/19/2015   Procedure: COLONOSCOPY WITH PROPOFOL;  Surgeon: Gatha Mayer, MD;  Location: [facility];  Service: Endoscopy;  Laterality: N/A;   CORONARY ARTERY BYPASS GRAFT  1998   x2; LIMA to LAD; SVG to diagonal off bypass   HEMORRHOID SURGERY  08/26/2012   Dr. Brantley Stage   LOOP RECORDER INSERTION N/A 05/13/2018   Procedure: LOOP RECORDER INSERTION;  Surgeon: [name], MD;  Location: [facility];  Service: Cardiovascular;  Laterality: N/A;   LOOP RECORDER REMOVAL N/A 05/26/2019   Procedure: LOOP RECORDER REMOVAL;  Surgeon: [name], MD;  Location: [facility];  Service: Cardiovascular;  Laterality: N/A;   MASS EXCISION Left 11/23/2015   Procedure:  LEFT WRIST EXCISION CYST;  Surgeon: Leanora Cover, MD;  Location: [facility];  Service: Orthopedics;  Laterality: Left;   PACEMAKER IMPLANT N/A 05/26/2019   Procedure: PACEMAKER IMPLANT;  Surgeon: [name], MD;  Location: [facility];  Service: Cardiovascular;  Laterality: N/A;   SKIN BIOPSY  01/29/14   (R) neck; (R) scalp, 2 (L) neck; shave biopsy Superficial basal cell carcinoma Dr. Danella Sensing   TONSILLECTOMY  [phone]    Allergies  Allergen Reactions   Iodinated Diagnostic Agents Nausea Only and Other (See Comments)    Severe nausea and also passed out   Iodine    Other     Seasonal Allergies.   Bactrim Rash   Relafen [Nabumetone] Rash   Sulfanilamide Rash    Outpatient Encounter Medications as of 06/21/2021  Medication Sig   acetaminophen (TYLENOL) 500 MG tablet Take 1,000 mg by mouth every 6 (six) hours as needed for mild pain (pain.).    amLODipine (NORVASC) 5 MG tablet Take 5 mg by mouth daily.   aspirin EC 81 MG EC tablet Take 1 tablet (81 mg total) by mouth daily. Swallow whole.   atenolol (TENORMIN) 50 MG tablet Take 50 mg by mouth 2 (two) times daily. HOLD FOR SPB <90 or HR< 60  calcium-vitamin D (OSCAL WITH D) 500-200 MG-UNIT tablet Take 1 tablet by mouth daily.   diclofenac Sodium (VOLTAREN) 1 % GEL Apply 4 g topically 4 (four) times daily as needed. Left knee, right knee, left ankle   digoxin (LANOXIN) 0.125 MG tablet Take 0.125 mg by mouth every other day.   hydrALAZINE (APRESOLINE) 10 MG tablet Take 10 mg by mouth 3 (three) times daily as needed. TID PRN for SBP >170   hydrochlorothiazide (HYDRODIURIL) 50 MG tablet Take 50 mg by mouth daily.   LORazepam (ATIVAN) 1 MG tablet Take 1 tablet (1 mg total) by mouth at bedtime.   ondansetron (ZOFRAN) 4 MG tablet Take 1 tablet (4 mg total) by mouth every 6 (six) hours as needed for nausea.   polyethylene glycol (MIRALAX / GLYCOLAX) 17 g packet Take 17 g by mouth every other day.   potassium chloride  SA (KLOR-CON) 20 MEQ tablet Take 20 mEq by mouth daily.   traMADol (ULTRAM) 50 MG tablet Take 1 tablet (50 mg total) by mouth every 6 (six) hours as needed for moderate pain or severe pain.   triamcinolone cream (KENALOG) 0.1 % Apply 1 application topically 2 (two) times daily. X 7 days for redness   No facility-administered encounter medications on file as of 06/21/2021.    Review of Systems  Constitutional:  Negative for activity change, appetite change, fatigue and fever.  HENT:  Positive for dental problem, hearing loss and trouble swallowing.        Cracked crown, bilateral hearing aids  Eyes:  Negative for visual disturbance.       Glasses  Respiratory:  Negative for cough, shortness of breath and wheezing.   Cardiovascular:  Negative for chest pain and leg swelling.  Gastrointestinal:  Negative for abdominal distention, abdominal pain, constipation, diarrhea and nausea.  Genitourinary:  Negative for dysuria, frequency and hematuria.  Musculoskeletal:  Positive for arthralgias, gait problem and myalgias.  Neurological:  Positive for weakness. Negative for dizziness, tremors and headaches.       Left sided weakness  Psychiatric/Behavioral:  Negative for confusion, dysphoric mood and sleep disturbance. The patient is not nervous/anxious.    Immunization History  Administered Date(s) Administered   Influenza Whole 08/06/2012   Influenza, High Dose Seasonal PF 08/15/2019, 08/27/2020   Influenza,inj,Quad PF,6+ Mos 08/30/2018   Influenza-Unspecified 08/06/2013, 08/24/2014, 08/26/2015, 08/31/2016, 08/27/2017, 08/27/2020   Moderna Sars-Covid-2 Vaccination 11/16/2019, 12/16/2019, 09/16/2020   Pneumococcal Conjugate-13 09/22/2015   Pneumococcal Polysaccharide-23 08/29/2006   Td 10/07/2007   Zoster Recombinat (Shingrix) 02/12/2018, 05/13/2018   Zoster, Live 03/12/2008   Pertinent  Health Maintenance Due  Topic Date Due   INFLUENZA VACCINE  06/06/2021   DEXA SCAN  Completed   PNA vac  Low Risk Adult  Completed   Fall Risk  09/23/2020 08/26/2020 07/28/2020 02/25/2020 01/28/2020  Falls in the past year? 1 1 0 0 0  Number falls in past yr: 0 0 1 0 0  Comment - - - - -  Injury with Fall? 1 1 0 0 0  Comment Patient fractured pelvis and had a stroke - - - -  Risk for fall due to : - History of fall(s);Impaired balance/gait;Impaired mobility;Orthopedic patient - - -  Follow up - Falls evaluation completed;Education provided;Falls prevention discussed;Follow up appointment - - -   Functional Status Survey:    Vitals:   06/21/21 0924  BP: 132/81  Pulse: 84  Resp: 14  Temp: (!) 97 F (36.1 C)  SpO2: 99%  Weight:  127 lb (57.6 kg)  Height: '5\' 6"'$  (1.676 m)   Body mass index is 20.5 kg/m. Physical Exam Vitals reviewed.  Constitutional:      General: She is not in acute distress. HENT:     Head: Normocephalic.     Right Ear: There is impacted cerumen.     Left Ear: There is no impacted cerumen.     Nose: Nose normal.     Mouth/Throat:     Mouth: Mucous membranes are moist.  Eyes:     General:        Right eye: No discharge.        Left eye: No discharge.     Pupils: Pupils are equal, round, and reactive to light.  Neck:     Vascular: No carotid bruit.  Cardiovascular:     Rate and Rhythm: Normal rate. Rhythm irregular.     Pulses: Normal pulses.     Heart sounds: Normal heart sounds. No murmur heard. Pulmonary:     Effort: Pulmonary effort is normal. No respiratory distress.     Breath sounds: Normal breath sounds. No wheezing.  Abdominal:     General: Bowel sounds are normal. There is no distension.     Palpations: Abdomen is soft.     Tenderness: There is no abdominal tenderness.  Musculoskeletal:     Cervical back: Normal range of motion.     Right lower leg: No edema.     Left lower leg: No edema.  Lymphadenopathy:     Cervical: No cervical adenopathy.  Skin:    General: Skin is warm and dry.     Capillary Refill: Capillary refill takes less than  2 seconds.     Comments: Abrasion to left shin CDI, granulation tissue present, no drainage or odor. Surrounding tissue intact.   Neurological:     General: No focal deficit present.     Mental Status: She is alert and oriented to person, place, and time.     Motor: Weakness present.     Gait: Gait abnormal.     Comments: Dysarthria, slight left facial droop. Left arm and leg weakness 3/5.     Labs reviewed: Recent Labs    08/17/20 0731 08/18/20 0255 10/12/20 0000 03/15/21 0000 04/17/21 0000 04/25/21 0000  NA 142 137   < > 134* 134* 135*  K 3.7 3.9   < > 4.0 3.3* 4.0  CL 105 106   < > 95* 94* 91*  CO2 24 23   < > 29* 30* 32*  GLUCOSE 112* 134*  --   --   --   --   BUN 24* 21   < > 26* 26* 23*  CREATININE 0.89 1.01*   < > 0.9 1.1 1.1  CALCIUM 9.3 8.7*   < > 9.2 9.0 9.8   < > = values in this interval not displayed.   Recent Labs    08/17/20 0731 08/18/20 0255  AST 32 26  ALT 24 21  ALKPHOS 62 53  BILITOT 1.2 1.2  PROT 6.5 6.1*  ALBUMIN 3.7 3.4*   Recent Labs    08/17/20 0731 08/17/20 1322 08/18/20 0255 08/23/20 0521 08/23/20 1621 08/30/20 0000 10/12/20 0000 03/22/21 0000 04/17/21 0000 05/20/21 0000  WBC 15.5* 12.7* 15.9* 16.9*  --  13.9   < > 5.4 8.3 5.7  NEUTROABS 11.9* 11.1*  --   --   --  10.40  --   --   --   --  HGB 11.2* 9.7* 9.1* 7.3*   < > 9.6*   < > 13.4 12.9 12.7  HCT 35.0* 31.3* 29.2* 24.1*   < > 29*   < > 40 38 37  MCV 92.3 93.4 94.5 97.6  --   --   --   --   --   --   PLT 84* 81* 81* 138*  --  197   < > 126* 92* 121*   < > = values in this interval not displayed.   Lab Results  Component Value Date   TSH 2.48 03/22/2021   Lab Results  Component Value Date   HGBA1C 5.7 (H) 08/19/2020   Lab Results  Component Value Date   CHOL 166 07/22/2020   HDL 53 07/22/2020   LDLCALC 106 03/25/2019   LDLDIRECT 81.3 08/19/2020   TRIG 54 07/22/2020   CHOLHDL 2 09/12/2010    Significant Diagnostic Results in last 30 days:  CUP PACEART REMOTE  DEVICE CHECK  Result Date: 06/04/2021 Scheduled remote reviewed. Normal device function.  Next remote 91 days.   Assessment/Plan 1. Onychomycosis - ongoing, left great toe and second toe appear yellow and thick - unsuccessful trial of applying tea tree oil bid x 3 months - recommend f/u with Dr. Jacqualyn Posey to discuss treatment options  2. 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 Arthritis of right knee - intermittent - cont prn tramadol, tylenol and Voltaren gel  4. Essential hypertension - controlled - cont norvasc, toprol and HCTZ regimen  5. Permanent atrial fibrillation (HCC) - rate controlled with digoxin and toprol - cannot tolerate DOAC d/t ruptured bakers cyst with hemorrhage - cont ASA 81 mg daily  - digoxin level  6. Atrioventricular block, complete (HCC) - s/p pacemaker- last check 02/2021  7. Oropharyngeal dysphagia - no recent aspirations - cont regular diet and thin liquids  8. Slow transit constipation - LBM 08/16 - cont miralax QOD   Family/ staff Communication: plan discussed with patient and nurse  Labs/tests ordered:  digoxin level

## 2021-06-22 ENCOUNTER — Encounter: Payer: Self-pay | Admitting: Internal Medicine

## 2021-06-22 DIAGNOSIS — Z79899 Other long term (current) drug therapy: Secondary | ICD-10-CM | POA: Diagnosis not present

## 2021-06-22 DIAGNOSIS — I6389 Other cerebral infarction: Secondary | ICD-10-CM | POA: Diagnosis not present

## 2021-06-22 DIAGNOSIS — R278 Other lack of coordination: Secondary | ICD-10-CM | POA: Diagnosis not present

## 2021-06-22 DIAGNOSIS — Z9181 History of falling: Secondary | ICD-10-CM | POA: Diagnosis not present

## 2021-06-22 DIAGNOSIS — M6389 Disorders of muscle in diseases classified elsewhere, multiple sites: Secondary | ICD-10-CM | POA: Diagnosis not present

## 2021-06-22 DIAGNOSIS — M84350S Stress fracture, pelvis, sequela: Secondary | ICD-10-CM | POA: Diagnosis not present

## 2021-06-23 DIAGNOSIS — I6389 Other cerebral infarction: Secondary | ICD-10-CM | POA: Diagnosis not present

## 2021-06-23 DIAGNOSIS — M6389 Disorders of muscle in diseases classified elsewhere, multiple sites: Secondary | ICD-10-CM | POA: Diagnosis not present

## 2021-06-23 DIAGNOSIS — Z9181 History of falling: Secondary | ICD-10-CM | POA: Diagnosis not present

## 2021-06-23 DIAGNOSIS — M84350S Stress fracture, pelvis, sequela: Secondary | ICD-10-CM | POA: Diagnosis not present

## 2021-06-23 DIAGNOSIS — R278 Other lack of coordination: Secondary | ICD-10-CM | POA: Diagnosis not present

## 2021-06-24 DIAGNOSIS — M6389 Disorders of muscle in diseases classified elsewhere, multiple sites: Secondary | ICD-10-CM | POA: Diagnosis not present

## 2021-06-24 DIAGNOSIS — Z9181 History of falling: Secondary | ICD-10-CM | POA: Diagnosis not present

## 2021-06-24 DIAGNOSIS — I6389 Other cerebral infarction: Secondary | ICD-10-CM | POA: Diagnosis not present

## 2021-06-24 DIAGNOSIS — R278 Other lack of coordination: Secondary | ICD-10-CM | POA: Diagnosis not present

## 2021-06-24 DIAGNOSIS — M84350S Stress fracture, pelvis, sequela: Secondary | ICD-10-CM | POA: Diagnosis not present

## 2021-06-27 DIAGNOSIS — M84350S Stress fracture, pelvis, sequela: Secondary | ICD-10-CM | POA: Diagnosis not present

## 2021-06-27 DIAGNOSIS — I6389 Other cerebral infarction: Secondary | ICD-10-CM | POA: Diagnosis not present

## 2021-06-27 DIAGNOSIS — M6389 Disorders of muscle in diseases classified elsewhere, multiple sites: Secondary | ICD-10-CM | POA: Diagnosis not present

## 2021-06-27 DIAGNOSIS — R278 Other lack of coordination: Secondary | ICD-10-CM | POA: Diagnosis not present

## 2021-06-27 DIAGNOSIS — Z9181 History of falling: Secondary | ICD-10-CM | POA: Diagnosis not present

## 2021-06-28 DIAGNOSIS — R278 Other lack of coordination: Secondary | ICD-10-CM | POA: Diagnosis not present

## 2021-06-28 DIAGNOSIS — I6389 Other cerebral infarction: Secondary | ICD-10-CM | POA: Diagnosis not present

## 2021-06-28 DIAGNOSIS — M84350S Stress fracture, pelvis, sequela: Secondary | ICD-10-CM | POA: Diagnosis not present

## 2021-06-28 DIAGNOSIS — Z9181 History of falling: Secondary | ICD-10-CM | POA: Diagnosis not present

## 2021-06-28 DIAGNOSIS — M6389 Disorders of muscle in diseases classified elsewhere, multiple sites: Secondary | ICD-10-CM | POA: Diagnosis not present

## 2021-06-29 ENCOUNTER — Non-Acute Institutional Stay (SKILLED_NURSING_FACILITY): Payer: Medicare Other | Admitting: Orthopedic Surgery

## 2021-06-29 DIAGNOSIS — M84350S Stress fracture, pelvis, sequela: Secondary | ICD-10-CM | POA: Diagnosis not present

## 2021-06-29 DIAGNOSIS — J302 Other seasonal allergic rhinitis: Secondary | ICD-10-CM

## 2021-06-29 DIAGNOSIS — H109 Unspecified conjunctivitis: Secondary | ICD-10-CM

## 2021-06-29 DIAGNOSIS — B9689 Other specified bacterial agents as the cause of diseases classified elsewhere: Secondary | ICD-10-CM

## 2021-06-29 DIAGNOSIS — R059 Cough, unspecified: Secondary | ICD-10-CM

## 2021-06-29 DIAGNOSIS — Z9181 History of falling: Secondary | ICD-10-CM | POA: Diagnosis not present

## 2021-06-29 DIAGNOSIS — R278 Other lack of coordination: Secondary | ICD-10-CM | POA: Diagnosis not present

## 2021-06-29 DIAGNOSIS — I6389 Other cerebral infarction: Secondary | ICD-10-CM | POA: Diagnosis not present

## 2021-06-29 DIAGNOSIS — M6389 Disorders of muscle in diseases classified elsewhere, multiple sites: Secondary | ICD-10-CM | POA: Diagnosis not present

## 2021-06-29 NOTE — Progress Notes (Signed)
Remote pacemaker transmission.   

## 2021-06-30 ENCOUNTER — Encounter: Payer: Self-pay | Admitting: Orthopedic Surgery

## 2021-06-30 DIAGNOSIS — M84350S Stress fracture, pelvis, sequela: Secondary | ICD-10-CM | POA: Diagnosis not present

## 2021-06-30 DIAGNOSIS — Z9181 History of falling: Secondary | ICD-10-CM | POA: Diagnosis not present

## 2021-06-30 DIAGNOSIS — R278 Other lack of coordination: Secondary | ICD-10-CM | POA: Diagnosis not present

## 2021-06-30 DIAGNOSIS — I6389 Other cerebral infarction: Secondary | ICD-10-CM | POA: Diagnosis not present

## 2021-06-30 DIAGNOSIS — M6389 Disorders of muscle in diseases classified elsewhere, multiple sites: Secondary | ICD-10-CM | POA: Diagnosis not present

## 2021-06-30 NOTE — Progress Notes (Signed)
Location:  [facility] Room Number: 118-A Place of Service:  SNF [phone]) Provider:  Windell Moulding, AGNP-C  [name], MD  Patient Care Team: [name], MD as PCP - General (Internal Medicine) [name], MD as PCP - Electrophysiology (Cardiology) [name], MD as Consulting Physician (Dermatology) [name], MD as Consulting Physician (Cardiology) [facility] [name], MD as Consulting Physician (Dermatology)  Extended Emergency Contact Information Primary Emergency Contact: [name] of [place] Phone: [phone] Mobile Phone: [phone] Relation: Daughter Secondary Emergency Contact: [name],[name]  [name] of [place] Phone: [phone] Relation: Son  Code Status:  DNR Goals of care: Advanced Directive information Advanced Directives 06/21/2021  Does Patient Have a Medical Advance Directive? Yes  Type of Paramedic of [place];Living will;Out of facility DNR (pink MOST or yellow form)  Does patient want to make changes to medical advance directive? No - Patient declined  Copy of [place] in Chart? Yes - validated most recent copy scanned in chart (See row information)  Pre-existing out of facility DNR order (yellow form or pink MOST form) Yellow form placed in chart (order not valid for inpatient use);Pink MOST form placed in chart (order not valid for inpatient use)     Chief Complaint  Patient presents with   Acute Visit    Nasal congestion, cough, eye drainage    HPI:  Pt is a 86 y.o. female seen today for acute visit due to increased nasal drainage, cough and eye drainage.   This morning, she reports waking up with clear nasal congestion, dry cough and green drainage at the corners of her eyes. A few hours later, she noticed her symptoms felt worse and told nursing staff. Rapid covid test negative. She  reports her eyes are now red and swollen. Denies burning or itching eyes. Wears glasses. She believes her nasal congestion is stimulating her dry cough. Denies sputum or sob. Reports past history of allergies in the summer. Does not take allergy medication at this time. Vitals stable, remains afebrile.   Nurse does not report any other concerns.  Past Medical History:  Diagnosis Date   Actinic keratosis 05/25/2014   Anxiety    Atrial fibrillation ([place]) 09/21/2010   Basal cell carcinoma 05/25/2014   Multiple removed by Dr. Danella Sensing in March 2015: right neck, left neck, scalp    Bell's palsy 07/18/1979   Cervicalgia 01/31/2012   Closed fracture of lumbar vertebra without mention of spinal cord injury 07/17/2005   Conjunctiva disorder 12/26/2010   Coronary atherosclerosis of native coronary artery 07/17/1997   Cramp of limb 08/16/2009   Degeneration of lumbar or lumbosacral intervertebral disc 01/31/2012   Disturbance of skin sensation 08/2009   Dizziness and giddiness 11/08/2011   vertigo   External hemorrhoids without mention of complication [id number]   External hemorrhoids without mention of complication [id number]   Ganglion of tendon sheath 08/16/2009   Leg cramp 10/27/2013   Most frequently the right leg.    Long term (current) use of anticoagulants 09/2010   Lumbago 07/2009   Meralgia paresthetica 07/2007   MI, old    Myalgia and myositis, unspecified 01/17/2012   Osteoporosis    Other abnormal blood chemistry 04/08/2012   Other abnormal blood chemistry 2013   hyperglycemia   Other disorder of muscle, ligament, and fascia 04/08/2012   Other specified cardiac dysrhythmias(427.89) 06/27/2010   Pacemaker 05/06/2020   Pain in joint,  ankle and foot 10/27/2013   Bilateral since 1998    Pain in joint, shoulder region 08/12/2012   Pain in joint, upper arm 12/26/2010   Pain in limb 01/11/2011   Pain in thoracic spine 01/31/2012   Pathologic fracture of vertebrae 01/22/2012   PVC's (premature  ventricular contractions)    Rash and other nonspecific skin eruption 08/02/2011   Senile osteoporosis 07/17/1993   Stroke ([place])    Unspecified essential hypertension 07/17/1997   Varicose veins of lower extremities 08/16/2009   Varicose veins of lower extremities 08/16/2009   Past Surgical History:  Procedure Laterality Date   COLONOSCOPY WITH PROPOFOL N/A 10/19/2015   Procedure: COLONOSCOPY WITH PROPOFOL;  Surgeon: Gatha Mayer, MD;  Location: [facility];  Service: Endoscopy;  Laterality: N/A;   CORONARY ARTERY BYPASS GRAFT  1998   x2; LIMA to LAD; SVG to diagonal off bypass   HEMORRHOID SURGERY  08/26/2012   Dr. Brantley Stage   LOOP RECORDER INSERTION N/A 05/13/2018   Procedure: LOOP RECORDER INSERTION;  Surgeon: [name], MD;  Location: [facility];  Service: Cardiovascular;  Laterality: N/A;   LOOP RECORDER REMOVAL N/A 05/26/2019   Procedure: LOOP RECORDER REMOVAL;  Surgeon: [name], MD;  Location: [facility];  Service: Cardiovascular;  Laterality: N/A;   MASS EXCISION Left 11/23/2015   Procedure: LEFT WRIST EXCISION CYST;  Surgeon: Leanora Cover, MD;  Location: [facility];  Service: Orthopedics;  Laterality: Left;   PACEMAKER IMPLANT N/A 05/26/2019   Procedure: PACEMAKER IMPLANT;  Surgeon: [name], MD;  Location: [facility];  Service: Cardiovascular;  Laterality: N/A;   SKIN BIOPSY  01/29/14   (R) neck; (R) scalp, 2 (L) neck; shave biopsy Superficial basal cell carcinoma Dr. Danella Sensing   TONSILLECTOMY  [phone]    Allergies  Allergen Reactions   Iodinated Diagnostic Agents Nausea Only and Other (See Comments)    Severe nausea and also passed out   Iodine    Other     Seasonal Allergies.   Bactrim Rash   Relafen [Nabumetone] Rash   Sulfanilamide Rash    Outpatient Encounter Medications as of 06/29/2021  Medication Sig   acetaminophen (TYLENOL) 500 MG tablet Take 1,000 mg by mouth every 6 (six) hours as needed for mild pain  (pain.).    amLODipine (NORVASC) 5 MG tablet Take 5 mg by mouth daily.   aspirin EC 81 MG EC tablet Take 1 tablet (81 mg total) by mouth daily. Swallow whole.   atenolol (TENORMIN) 50 MG tablet Take 50 mg by mouth 2 (two) times daily. HOLD FOR SPB <90 or HR< 60   calcium-vitamin D (OSCAL WITH D) 500-200 MG-UNIT tablet Take 1 tablet by mouth daily.   diclofenac Sodium (VOLTAREN) 1 % GEL Apply 4 g topically 4 (four) times daily as needed. Left knee, right knee, left ankle   digoxin (LANOXIN) 0.125 MG tablet Take 0.125 mg by mouth every other day.   hydrALAZINE (APRESOLINE) 10 MG tablet Take 10 mg by mouth 3 (three) times daily as needed. TID PRN for SBP >170   hydrochlorothiazide (HYDRODIURIL) 50 MG tablet Take 50 mg by mouth daily.   LORazepam (ATIVAN) 1 MG tablet Take 1 tablet (1 mg total) by mouth at bedtime.   ondansetron (ZOFRAN) 4 MG tablet Take 1 tablet (4 mg total) by mouth every 6 (six) hours as needed for nausea.   polyethylene glycol (MIRALAX / GLYCOLAX) 17 g packet Take 17 g  by mouth every other day.   potassium chloride SA (KLOR-CON) 20 MEQ tablet Take 20 mEq by mouth daily.   traMADol (ULTRAM) 50 MG tablet Take 1 tablet (50 mg total) by mouth every 6 (six) hours as needed for moderate pain or severe pain.   No facility-administered encounter medications on file as of 06/29/2021.    Review of Systems  Constitutional:  Negative for activity change, appetite change, chills, fatigue and fever.  HENT:  Positive for rhinorrhea. Negative for sinus pressure, sinus pain, sore throat and trouble swallowing.   Eyes:  Positive for discharge and redness. Negative for itching and visual disturbance.       Glasses  Respiratory:  Positive for cough. Negative for shortness of breath and wheezing.   Cardiovascular:  Negative for chest pain and leg swelling.  Gastrointestinal:  Negative for diarrhea, nausea and vomiting.  Musculoskeletal:  Negative for myalgias.  Psychiatric/Behavioral:  Negative  for dysphoric mood. The patient is not nervous/anxious.    Immunization History  Administered Date(s) Administered   Influenza Whole 08/06/2012   Influenza, High Dose Seasonal PF 08/15/2019, 08/27/2020   Influenza,inj,Quad PF,6+ Mos 08/30/2018   Influenza-Unspecified 08/06/2013, 08/24/2014, 08/26/2015, 08/31/2016, 08/27/2017, 08/27/2020   Moderna Sars-Covid-2 Vaccination 11/16/2019, 12/16/2019, 09/16/2020   Pneumococcal Conjugate-13 09/22/2015   Pneumococcal Polysaccharide-23 08/29/2006   Td 10/07/2007   Zoster Recombinat (Shingrix) 02/12/2018, 05/13/2018   Zoster, Live 03/12/2008   Pertinent  Health Maintenance Due  Topic Date Due   INFLUENZA VACCINE  06/06/2021   DEXA SCAN  Completed   PNA vac Low Risk Adult  Completed   Fall Risk  09/23/2020 08/26/2020 07/28/2020 02/25/2020 01/28/2020  Falls in the past year? 1 1 0 0 0  Number falls in past yr: 0 0 1 0 0  Comment - - - - -  Injury with Fall? 1 1 0 0 0  Comment Patient fractured pelvis and had a stroke - - - -  Risk for fall due to : - History of fall(s);Impaired balance/gait;Impaired mobility;Orthopedic patient - - -  Follow up - Falls evaluation completed;Education provided;Falls prevention discussed;Follow up appointment - - -   Functional Status Survey:    Vitals:   06/30/21 1116  BP: 139/81  Pulse: 86  Resp: 16  Temp: 97.6 F (36.4 C)  SpO2: 98%  Weight: 127 lb (57.6 kg)  Height: '5\' 6"'$  (1.676 m)   Body mass index is 20.5 kg/m. Physical Exam Vitals reviewed.  Constitutional:      General: She is not in acute distress. HENT:     Head: Normocephalic.     Right Ear: There is no impacted cerumen.     Left Ear: There is no impacted cerumen.     Nose: Congestion present. No nasal tenderness.     Right Turbinates: Not enlarged or swollen.     Left Turbinates: Not enlarged or swollen.     Right Sinus: No maxillary sinus tenderness or frontal sinus tenderness.     Left Sinus: No maxillary sinus tenderness or  frontal sinus tenderness.  Eyes:     General:        Right eye: Discharge present. No foreign body.        Left eye: Discharge present.No foreign body.     Extraocular Movements:     Right eye: Normal extraocular motion.     Left eye: Normal extraocular motion.  Cardiovascular:     Rate and Rhythm: Normal rate. Rhythm irregular.     Pulses: Normal pulses.  Heart sounds: Normal heart sounds. No murmur heard. Pulmonary:     Effort: Pulmonary effort is normal. No respiratory distress.     Breath sounds: Normal breath sounds. No wheezing.  Abdominal:     General: Bowel sounds are normal. There is no distension.     Palpations: Abdomen is soft.     Tenderness: There is no abdominal tenderness.  Musculoskeletal:     Cervical back: Normal range of motion.     Right lower leg: No edema.     Left lower leg: No edema.  Lymphadenopathy:     Cervical: No cervical adenopathy.  Skin:    General: Skin is warm and dry.     Capillary Refill: Capillary refill takes less than 2 seconds.  Neurological:     General: No focal deficit present.     Mental Status: She is alert and oriented to person, place, and time.  Psychiatric:        Mood and Affect: Mood normal.        Behavior: Behavior normal.    Labs reviewed: Recent Labs    08/17/20 0731 08/18/20 0255 10/12/20 0000 03/15/21 0000 04/17/21 0000 04/25/21 0000  NA 142 137   < > 134* 134* 135*  K 3.7 3.9   < > 4.0 3.3* 4.0  CL 105 106   < > 95* 94* 91*  CO2 24 23   < > 29* 30* 32*  GLUCOSE 112* 134*  --   --   --   --   BUN 24* 21   < > 26* 26* 23*  CREATININE 0.89 1.01*   < > 0.9 1.1 1.1  CALCIUM 9.3 8.7*   < > 9.2 9.0 9.8   < > = values in this interval not displayed.   Recent Labs    08/17/20 0731 08/18/20 0255  AST 32 26  ALT 24 21  ALKPHOS 62 53  BILITOT 1.2 1.2  PROT 6.5 6.1*  ALBUMIN 3.7 3.4*   Recent Labs    08/17/20 0731 08/17/20 1322 08/18/20 0255 08/23/20 0521 08/23/20 1621 08/30/20 0000  10/12/20 0000 03/22/21 0000 04/17/21 0000 05/20/21 0000  WBC 15.5* 12.7* 15.9* 16.9*  --  13.9   < > 5.4 8.3 5.7  NEUTROABS 11.9* 11.1*  --   --   --  10.40  --   --   --   --   HGB 11.2* 9.7* 9.1* 7.3*   < > 9.6*   < > 13.4 12.9 12.7  HCT 35.0* 31.3* 29.2* 24.1*   < > 29*   < > 40 38 37  MCV 92.3 93.4 94.5 97.6  --   --   --   --   --   --   PLT 84* 81* 81* 138*  --  197   < > 126* 92* 121*   < > = values in this interval not displayed.   Lab Results  Component Value Date   TSH 2.48 03/22/2021   Lab Results  Component Value Date   HGBA1C 5.7 (H) 08/19/2020   Lab Results  Component Value Date   CHOL 166 07/22/2020   HDL 53 07/22/2020   LDLCALC 106 03/25/2019   LDLDIRECT 81.3 08/19/2020   TRIG 54 07/22/2020   CHOLHDL 2 09/12/2010    Significant Diagnostic Results in last 30 days:  CUP PACEART REMOTE DEVICE CHECK  Result Date: 06/04/2021 Scheduled remote reviewed. Normal device function.  Next remote 91 days.   Assessment/Plan 1.  Bacterial conjunctivitis of both eyes - upper eyelids red and swollen - green discharge observed - ofloxacin eye drops 0.3%- 2 gtts to both eyes q4 hrs x 2 days then QID x 5 days  2. Seasonal allergies - nasal drainage clear, turbinates normal - Flonase - 1 spray to both nostrils bid x 10 days - Claritin 10 mg po daily x 10 days  3. Cough - lung sounds clear in all lung fields - suspect cough stimulated from nasal congestion - rapid covid negative    Family/ staff Communication: plan discussed with patient and nurse  Labs/tests ordered:  none

## 2021-07-01 DIAGNOSIS — I6389 Other cerebral infarction: Secondary | ICD-10-CM | POA: Diagnosis not present

## 2021-07-01 DIAGNOSIS — M84350S Stress fracture, pelvis, sequela: Secondary | ICD-10-CM | POA: Diagnosis not present

## 2021-07-01 DIAGNOSIS — R278 Other lack of coordination: Secondary | ICD-10-CM | POA: Diagnosis not present

## 2021-07-01 DIAGNOSIS — Z9181 History of falling: Secondary | ICD-10-CM | POA: Diagnosis not present

## 2021-07-01 DIAGNOSIS — M6389 Disorders of muscle in diseases classified elsewhere, multiple sites: Secondary | ICD-10-CM | POA: Diagnosis not present

## 2021-07-04 DIAGNOSIS — I6389 Other cerebral infarction: Secondary | ICD-10-CM | POA: Diagnosis not present

## 2021-07-04 DIAGNOSIS — R278 Other lack of coordination: Secondary | ICD-10-CM | POA: Diagnosis not present

## 2021-07-04 DIAGNOSIS — M6389 Disorders of muscle in diseases classified elsewhere, multiple sites: Secondary | ICD-10-CM | POA: Diagnosis not present

## 2021-07-04 DIAGNOSIS — M84350S Stress fracture, pelvis, sequela: Secondary | ICD-10-CM | POA: Diagnosis not present

## 2021-07-04 DIAGNOSIS — Z9181 History of falling: Secondary | ICD-10-CM | POA: Diagnosis not present

## 2021-07-05 DIAGNOSIS — I6389 Other cerebral infarction: Secondary | ICD-10-CM | POA: Diagnosis not present

## 2021-07-05 DIAGNOSIS — M6389 Disorders of muscle in diseases classified elsewhere, multiple sites: Secondary | ICD-10-CM | POA: Diagnosis not present

## 2021-07-05 DIAGNOSIS — Z9181 History of falling: Secondary | ICD-10-CM | POA: Diagnosis not present

## 2021-07-05 DIAGNOSIS — R278 Other lack of coordination: Secondary | ICD-10-CM | POA: Diagnosis not present

## 2021-07-05 DIAGNOSIS — M84350S Stress fracture, pelvis, sequela: Secondary | ICD-10-CM | POA: Diagnosis not present

## 2021-07-06 DIAGNOSIS — Z9181 History of falling: Secondary | ICD-10-CM | POA: Diagnosis not present

## 2021-07-06 DIAGNOSIS — M84350S Stress fracture, pelvis, sequela: Secondary | ICD-10-CM | POA: Diagnosis not present

## 2021-07-06 DIAGNOSIS — I6389 Other cerebral infarction: Secondary | ICD-10-CM | POA: Diagnosis not present

## 2021-07-06 DIAGNOSIS — M6389 Disorders of muscle in diseases classified elsewhere, multiple sites: Secondary | ICD-10-CM | POA: Diagnosis not present

## 2021-07-06 DIAGNOSIS — R278 Other lack of coordination: Secondary | ICD-10-CM | POA: Diagnosis not present

## 2021-07-07 DIAGNOSIS — R278 Other lack of coordination: Secondary | ICD-10-CM | POA: Diagnosis not present

## 2021-07-07 DIAGNOSIS — M6389 Disorders of muscle in diseases classified elsewhere, multiple sites: Secondary | ICD-10-CM | POA: Diagnosis not present

## 2021-07-07 DIAGNOSIS — I6389 Other cerebral infarction: Secondary | ICD-10-CM | POA: Diagnosis not present

## 2021-07-07 DIAGNOSIS — M84350S Stress fracture, pelvis, sequela: Secondary | ICD-10-CM | POA: Diagnosis not present

## 2021-07-07 DIAGNOSIS — Z9181 History of falling: Secondary | ICD-10-CM | POA: Diagnosis not present

## 2021-07-08 DIAGNOSIS — Z9181 History of falling: Secondary | ICD-10-CM | POA: Diagnosis not present

## 2021-07-08 DIAGNOSIS — R278 Other lack of coordination: Secondary | ICD-10-CM | POA: Diagnosis not present

## 2021-07-08 DIAGNOSIS — M84350S Stress fracture, pelvis, sequela: Secondary | ICD-10-CM | POA: Diagnosis not present

## 2021-07-08 DIAGNOSIS — M6389 Disorders of muscle in diseases classified elsewhere, multiple sites: Secondary | ICD-10-CM | POA: Diagnosis not present

## 2021-07-08 DIAGNOSIS — I6389 Other cerebral infarction: Secondary | ICD-10-CM | POA: Diagnosis not present

## 2021-07-12 DIAGNOSIS — I6389 Other cerebral infarction: Secondary | ICD-10-CM | POA: Diagnosis not present

## 2021-07-12 DIAGNOSIS — M6389 Disorders of muscle in diseases classified elsewhere, multiple sites: Secondary | ICD-10-CM | POA: Diagnosis not present

## 2021-07-12 DIAGNOSIS — M84350S Stress fracture, pelvis, sequela: Secondary | ICD-10-CM | POA: Diagnosis not present

## 2021-07-12 DIAGNOSIS — Z9181 History of falling: Secondary | ICD-10-CM | POA: Diagnosis not present

## 2021-07-12 DIAGNOSIS — R278 Other lack of coordination: Secondary | ICD-10-CM | POA: Diagnosis not present

## 2021-07-13 DIAGNOSIS — Z9181 History of falling: Secondary | ICD-10-CM | POA: Diagnosis not present

## 2021-07-13 DIAGNOSIS — I6389 Other cerebral infarction: Secondary | ICD-10-CM | POA: Diagnosis not present

## 2021-07-13 DIAGNOSIS — M84350S Stress fracture, pelvis, sequela: Secondary | ICD-10-CM | POA: Diagnosis not present

## 2021-07-13 DIAGNOSIS — M6389 Disorders of muscle in diseases classified elsewhere, multiple sites: Secondary | ICD-10-CM | POA: Diagnosis not present

## 2021-07-13 DIAGNOSIS — R278 Other lack of coordination: Secondary | ICD-10-CM | POA: Diagnosis not present

## 2021-07-14 DIAGNOSIS — I6389 Other cerebral infarction: Secondary | ICD-10-CM | POA: Diagnosis not present

## 2021-07-14 DIAGNOSIS — R278 Other lack of coordination: Secondary | ICD-10-CM | POA: Diagnosis not present

## 2021-07-14 DIAGNOSIS — M6389 Disorders of muscle in diseases classified elsewhere, multiple sites: Secondary | ICD-10-CM | POA: Diagnosis not present

## 2021-07-14 DIAGNOSIS — M84350S Stress fracture, pelvis, sequela: Secondary | ICD-10-CM | POA: Diagnosis not present

## 2021-07-14 DIAGNOSIS — Z9181 History of falling: Secondary | ICD-10-CM | POA: Diagnosis not present

## 2021-07-15 DIAGNOSIS — M84350S Stress fracture, pelvis, sequela: Secondary | ICD-10-CM | POA: Diagnosis not present

## 2021-07-15 DIAGNOSIS — M6389 Disorders of muscle in diseases classified elsewhere, multiple sites: Secondary | ICD-10-CM | POA: Diagnosis not present

## 2021-07-15 DIAGNOSIS — I6389 Other cerebral infarction: Secondary | ICD-10-CM | POA: Diagnosis not present

## 2021-07-15 DIAGNOSIS — R278 Other lack of coordination: Secondary | ICD-10-CM | POA: Diagnosis not present

## 2021-07-15 DIAGNOSIS — Z9181 History of falling: Secondary | ICD-10-CM | POA: Diagnosis not present

## 2021-07-18 DIAGNOSIS — M6389 Disorders of muscle in diseases classified elsewhere, multiple sites: Secondary | ICD-10-CM | POA: Diagnosis not present

## 2021-07-18 DIAGNOSIS — R278 Other lack of coordination: Secondary | ICD-10-CM | POA: Diagnosis not present

## 2021-07-18 DIAGNOSIS — Z9181 History of falling: Secondary | ICD-10-CM | POA: Diagnosis not present

## 2021-07-18 DIAGNOSIS — M84350S Stress fracture, pelvis, sequela: Secondary | ICD-10-CM | POA: Diagnosis not present

## 2021-07-18 DIAGNOSIS — I6389 Other cerebral infarction: Secondary | ICD-10-CM | POA: Diagnosis not present

## 2021-07-19 ENCOUNTER — Encounter: Payer: Self-pay | Admitting: Podiatry

## 2021-07-19 ENCOUNTER — Ambulatory Visit (INDEPENDENT_AMBULATORY_CARE_PROVIDER_SITE_OTHER): Payer: Medicare Other | Admitting: Podiatry

## 2021-07-19 ENCOUNTER — Other Ambulatory Visit: Payer: Self-pay

## 2021-07-19 DIAGNOSIS — L84 Corns and callosities: Secondary | ICD-10-CM

## 2021-07-19 DIAGNOSIS — B351 Tinea unguium: Secondary | ICD-10-CM | POA: Diagnosis not present

## 2021-07-19 DIAGNOSIS — M79674 Pain in right toe(s): Secondary | ICD-10-CM

## 2021-07-19 DIAGNOSIS — M205X1 Other deformities of toe(s) (acquired), right foot: Secondary | ICD-10-CM

## 2021-07-19 DIAGNOSIS — M79675 Pain in left toe(s): Secondary | ICD-10-CM | POA: Diagnosis not present

## 2021-07-20 DIAGNOSIS — M84350S Stress fracture, pelvis, sequela: Secondary | ICD-10-CM | POA: Diagnosis not present

## 2021-07-20 DIAGNOSIS — I6389 Other cerebral infarction: Secondary | ICD-10-CM | POA: Diagnosis not present

## 2021-07-20 DIAGNOSIS — Z9181 History of falling: Secondary | ICD-10-CM | POA: Diagnosis not present

## 2021-07-20 DIAGNOSIS — R278 Other lack of coordination: Secondary | ICD-10-CM | POA: Diagnosis not present

## 2021-07-20 DIAGNOSIS — M6389 Disorders of muscle in diseases classified elsewhere, multiple sites: Secondary | ICD-10-CM | POA: Diagnosis not present

## 2021-07-21 DIAGNOSIS — Z9181 History of falling: Secondary | ICD-10-CM | POA: Diagnosis not present

## 2021-07-21 DIAGNOSIS — M6389 Disorders of muscle in diseases classified elsewhere, multiple sites: Secondary | ICD-10-CM | POA: Diagnosis not present

## 2021-07-21 DIAGNOSIS — I6389 Other cerebral infarction: Secondary | ICD-10-CM | POA: Diagnosis not present

## 2021-07-21 DIAGNOSIS — R278 Other lack of coordination: Secondary | ICD-10-CM | POA: Diagnosis not present

## 2021-07-21 DIAGNOSIS — M84350S Stress fracture, pelvis, sequela: Secondary | ICD-10-CM | POA: Diagnosis not present

## 2021-07-22 DIAGNOSIS — Z9181 History of falling: Secondary | ICD-10-CM | POA: Diagnosis not present

## 2021-07-22 DIAGNOSIS — M6389 Disorders of muscle in diseases classified elsewhere, multiple sites: Secondary | ICD-10-CM | POA: Diagnosis not present

## 2021-07-22 DIAGNOSIS — M84350S Stress fracture, pelvis, sequela: Secondary | ICD-10-CM | POA: Diagnosis not present

## 2021-07-22 DIAGNOSIS — I6389 Other cerebral infarction: Secondary | ICD-10-CM | POA: Diagnosis not present

## 2021-07-22 DIAGNOSIS — R278 Other lack of coordination: Secondary | ICD-10-CM | POA: Diagnosis not present

## 2021-07-24 NOTE — Progress Notes (Signed)
Subjective: 87 year old female presents the office today for concerns of left big toenail fungus as well as a corn on her right fifth toe.  Nails in general are elongated and thickened.  No swelling redness or any drainage.  Occasional discomfort from her inside shoes.  Also her second toe is longer than her big toe she reports.  No other concerns  Objective: AAO x3, NAD DP/PT pulses palpable bilaterally, CRT less than 3 seconds The nails are mildly hypertrophic, dystrophic with yellow-brown discoloration mostly over the hallux toenail.  There is no edema, erythema or signs of infection.  The nails do cause tenderness medical bilaterally.  There is a hyperkeratotic lesion on the right fifth toe which causes discomfort.  There is no underlying ulceration drainage or any obvious signs of infection.  Adductovarus present of the digit. The second toe was somewhat longer than the other digits. No pain with calf compression, swelling, warmth, erythema  Assessment: Symptomatic onychomycosis, hyperkeratotic lesion  Plan: -All treatment options discussed with the patient including all alternatives, risks, complications.  -I sharply debrided the nails x10 without any complications or bleeding.  Discussed treatment options for nail fungus however I think at this point hold off on any oral medication.  She is to continue with oil that is been applied to the nail.  We can try Penlac if she desires we discussed side effects.  I spent quite a bit of time trimming her nails and talking about the nails. -Sharply debrided the hyperkeratotic lesion x1 without any complications or bleeding.  Offloading. -Patient encouraged to call the office with any questions, concerns, change in symptoms.   Trula Slade DPM

## 2021-07-25 DIAGNOSIS — R278 Other lack of coordination: Secondary | ICD-10-CM | POA: Diagnosis not present

## 2021-07-25 DIAGNOSIS — I6389 Other cerebral infarction: Secondary | ICD-10-CM | POA: Diagnosis not present

## 2021-07-25 DIAGNOSIS — M6389 Disorders of muscle in diseases classified elsewhere, multiple sites: Secondary | ICD-10-CM | POA: Diagnosis not present

## 2021-07-25 DIAGNOSIS — M84350S Stress fracture, pelvis, sequela: Secondary | ICD-10-CM | POA: Diagnosis not present

## 2021-07-25 DIAGNOSIS — Z9181 History of falling: Secondary | ICD-10-CM | POA: Diagnosis not present

## 2021-07-26 DIAGNOSIS — M84350S Stress fracture, pelvis, sequela: Secondary | ICD-10-CM | POA: Diagnosis not present

## 2021-07-26 DIAGNOSIS — Z9181 History of falling: Secondary | ICD-10-CM | POA: Diagnosis not present

## 2021-07-26 DIAGNOSIS — R278 Other lack of coordination: Secondary | ICD-10-CM | POA: Diagnosis not present

## 2021-07-26 DIAGNOSIS — I6389 Other cerebral infarction: Secondary | ICD-10-CM | POA: Diagnosis not present

## 2021-07-26 DIAGNOSIS — M6389 Disorders of muscle in diseases classified elsewhere, multiple sites: Secondary | ICD-10-CM | POA: Diagnosis not present

## 2021-07-27 DIAGNOSIS — M84350S Stress fracture, pelvis, sequela: Secondary | ICD-10-CM | POA: Diagnosis not present

## 2021-07-27 DIAGNOSIS — R278 Other lack of coordination: Secondary | ICD-10-CM | POA: Diagnosis not present

## 2021-07-27 DIAGNOSIS — Z9181 History of falling: Secondary | ICD-10-CM | POA: Diagnosis not present

## 2021-07-27 DIAGNOSIS — M6389 Disorders of muscle in diseases classified elsewhere, multiple sites: Secondary | ICD-10-CM | POA: Diagnosis not present

## 2021-07-27 DIAGNOSIS — I6389 Other cerebral infarction: Secondary | ICD-10-CM | POA: Diagnosis not present

## 2021-07-28 ENCOUNTER — Non-Acute Institutional Stay (SKILLED_NURSING_FACILITY): Payer: Medicare Other | Admitting: Adult Health

## 2021-07-28 ENCOUNTER — Encounter: Payer: Self-pay | Admitting: Adult Health

## 2021-07-28 DIAGNOSIS — I69354 Hemiplegia and hemiparesis following cerebral infarction affecting left non-dominant side: Secondary | ICD-10-CM | POA: Diagnosis not present

## 2021-07-28 DIAGNOSIS — M84350S Stress fracture, pelvis, sequela: Secondary | ICD-10-CM | POA: Diagnosis not present

## 2021-07-28 DIAGNOSIS — J310 Chronic rhinitis: Secondary | ICD-10-CM

## 2021-07-28 DIAGNOSIS — Z9181 History of falling: Secondary | ICD-10-CM | POA: Diagnosis not present

## 2021-07-28 DIAGNOSIS — M25561 Pain in right knee: Secondary | ICD-10-CM

## 2021-07-28 DIAGNOSIS — R278 Other lack of coordination: Secondary | ICD-10-CM | POA: Diagnosis not present

## 2021-07-28 DIAGNOSIS — M6389 Disorders of muscle in diseases classified elsewhere, multiple sites: Secondary | ICD-10-CM | POA: Diagnosis not present

## 2021-07-28 DIAGNOSIS — D696 Thrombocytopenia, unspecified: Secondary | ICD-10-CM | POA: Diagnosis not present

## 2021-07-28 DIAGNOSIS — G8929 Other chronic pain: Secondary | ICD-10-CM | POA: Diagnosis not present

## 2021-07-28 DIAGNOSIS — I6389 Other cerebral infarction: Secondary | ICD-10-CM | POA: Diagnosis not present

## 2021-07-28 DIAGNOSIS — I4821 Permanent atrial fibrillation: Secondary | ICD-10-CM | POA: Diagnosis not present

## 2021-07-28 NOTE — Progress Notes (Signed)
Location:  [facility] Room Number: 118-A Place of Service:  SNF [phone]) Provider:  Royal Hawthorn, NP   Patient Care Team: [name], MD as PCP - General (Internal Medicine) [name], MD as PCP - Electrophysiology (Cardiology) [name], MD as Consulting Physician (Dermatology) [name], MD as Consulting Physician (Cardiology) [facility] [name], MD as Consulting Physician (Dermatology)  Extended Emergency Contact Information Primary Emergency Contact: [name] of [place] Phone: [phone] Mobile Phone: [phone] Relation: Daughter Secondary Emergency Contact: [name],[name]  [name] of [place] Phone: [phone] Relation: Son  Code Status: DNR Goals of care: Advanced Directive information Advanced Directives 07/28/2021  Does Patient Have a Medical Advance Directive? Yes  Type of Paramedic of [place];Living will;Out of facility DNR (pink MOST or yellow form)  Does patient want to make changes to medical advance directive? No - Patient declined  Copy of [place] in Chart? Yes - validated most recent copy scanned in chart (See row information)  Pre-existing out of facility DNR order (yellow form or pink MOST form) Yellow form placed in chart (order not valid for inpatient use);Pink MOST form placed in chart (order not valid for inpatient use)     Chief Complaint  Patient presents with   Medical Management of Chronic Issues    Routine visit. Discuss need for td/tdap and flu vaccine or exclude.     HPI:  Pt is a 87 y.o. female seen today for medical management of chronic diseases.  PMH significant for CVA with left sided weakness, dysarthria, afib, GIB, GERD, CAD, knee pain, HTN, pacemaker, HLD, OP, thrombocytopenia, anemia, and constipation.   Reports a runny nose and sneezing. Has tried fluticasone and  claritin with some relief. No eye symptoms. No cough or fever. Runny nose comes on after a meal.  She also has some right knee pain and uses voltaren gel with relief.  SBP 120-140 Tolerating a regular diet well Walks short distances with her daughter three times weekly   Past Medical History:  Diagnosis Date   Actinic keratosis 05/25/2014   Anxiety    Atrial fibrillation ([place]) 09/21/2010   Basal cell carcinoma 05/25/2014   Multiple removed by Dr. Danella Sensing in March 2015: right neck, left neck, scalp    Bell's palsy 07/18/1979   Cervicalgia 01/31/2012   Closed fracture of lumbar vertebra without mention of spinal cord injury 07/17/2005   Conjunctiva disorder 12/26/2010   Coronary atherosclerosis of native coronary artery 07/17/1997   Cramp of limb 08/16/2009   Degeneration of lumbar or lumbosacral intervertebral disc 01/31/2012   Disturbance of skin sensation 08/2009   Dizziness and giddiness 11/08/2011   vertigo   External hemorrhoids without mention of complication 3/71/0626   External hemorrhoids without mention of complication 94/85/4627   Ganglion of tendon sheath 08/16/2009   Leg cramp 10/27/2013   Most frequently the right leg.    Long term (current) use of anticoagulants 09/2010   Lumbago 07/2009   Meralgia paresthetica 07/2007   MI, old    Myalgia and myositis, unspecified 01/17/2012   Osteoporosis    Other abnormal blood chemistry 04/08/2012   Other abnormal blood chemistry 2013   hyperglycemia   Other disorder of muscle, ligament, and fascia 04/08/2012   Other specified cardiac dysrhythmias(427.89) 06/27/2010   Pacemaker 05/06/2020   Pain in joint, ankle and foot 10/27/2013   Bilateral since 1998    Pain in joint,  shoulder region 08/12/2012   Pain in joint, upper arm 12/26/2010   Pain in limb 01/11/2011   Pain in thoracic spine 01/31/2012   Pathologic fracture of vertebrae 01/22/2012   PVC's (premature ventricular contractions)    Rash and other nonspecific skin eruption  08/02/2011   Senile osteoporosis 07/17/1993   Stroke ([place])    Unspecified essential hypertension 07/17/1997   Varicose veins of lower extremities 08/16/2009   Varicose veins of lower extremities 08/16/2009   Past Surgical History:  Procedure Laterality Date   COLONOSCOPY WITH PROPOFOL N/A 10/19/2015   Procedure: COLONOSCOPY WITH PROPOFOL;  Surgeon: Gatha Mayer, MD;  Location: [facility];  Service: Endoscopy;  Laterality: N/A;   CORONARY ARTERY BYPASS GRAFT  1998   x2; LIMA to LAD; SVG to diagonal off bypass   HEMORRHOID SURGERY  08/26/2012   Dr. Brantley Stage   LOOP RECORDER INSERTION N/A 05/13/2018   Procedure: LOOP RECORDER INSERTION;  Surgeon: [name], MD;  Location: [facility];  Service: Cardiovascular;  Laterality: N/A;   LOOP RECORDER REMOVAL N/A 05/26/2019   Procedure: LOOP RECORDER REMOVAL;  Surgeon: [name], MD;  Location: [facility];  Service: Cardiovascular;  Laterality: N/A;   MASS EXCISION Left 11/23/2015   Procedure: LEFT WRIST EXCISION CYST;  Surgeon: Leanora Cover, MD;  Location: [facility];  Service: Orthopedics;  Laterality: Left;   PACEMAKER IMPLANT N/A 05/26/2019   Procedure: PACEMAKER IMPLANT;  Surgeon: [name], MD;  Location: [facility];  Service: Cardiovascular;  Laterality: N/A;   SKIN BIOPSY  01/29/14   (R) neck; (R) scalp, 2 (L) neck; shave biopsy Superficial basal cell carcinoma Dr. Danella Sensing   TONSILLECTOMY  [id number]    Allergies  Allergen Reactions   Iodinated Diagnostic Agents Nausea Only and Other (See Comments)    Severe nausea and also passed out   Iodine    Other     Seasonal Allergies.   Bactrim Rash   Relafen [Nabumetone] Rash   Sulfanilamide Rash    Outpatient Encounter Medications as of 07/28/2021  Medication Sig   acetaminophen (TYLENOL) 500 MG tablet Take 1,000 mg by mouth every 6 (six) hours as needed for mild pain (pain.).    amLODipine (NORVASC) 5 MG tablet Take 5 mg by mouth  daily.   aspirin EC 81 MG EC tablet Take 1 tablet (81 mg total) by mouth daily. Swallow whole.   atenolol (TENORMIN) 50 MG tablet Take 50 mg by mouth 2 (two) times daily. HOLD FOR SPB <90 or HR< 60   calcium-vitamin D (OSCAL WITH D) 500-200 MG-UNIT tablet Take 1 tablet by mouth daily.   diclofenac Sodium (VOLTAREN) 1 % GEL Apply 4 g topically 4 (four) times daily as needed. Left knee, right knee, left ankle   digoxin (LANOXIN) 0.125 MG tablet Take 0.125 mg by mouth every other day.   fluticasone (FLONASE) 50 MCG/ACT nasal spray Place 1 spray into both nostrils 2 (two) times daily.   hydrALAZINE (APRESOLINE) 10 MG tablet Take 10 mg by mouth 3 (three) times daily as needed. TID PRN for SBP >170   hydrochlorothiazide (HYDRODIURIL) 50 MG tablet Take 50 mg by mouth daily.   loratadine (CLARITIN) 10 MG tablet Take 10 mg by mouth at bedtime.   LORazepam (ATIVAN) 1 MG tablet Take 1 tablet (1 mg total) by mouth at bedtime.   ondansetron (ZOFRAN) 4 MG tablet Take 1 tablet (4 mg total) by mouth every 6 (six) hours  as needed for nausea.   polyethylene glycol (MIRALAX / GLYCOLAX) 17 g packet Take 17 g by mouth every other day.   potassium chloride SA (KLOR-CON) 20 MEQ tablet Take 20 mEq by mouth daily.   traMADol (ULTRAM) 50 MG tablet Take 1 tablet (50 mg total) by mouth every 6 (six) hours as needed for moderate pain or severe pain.   No facility-administered encounter medications on file as of 07/28/2021.    Review of Systems  Constitutional:  Negative for activity change, appetite change, chills, diaphoresis and fever.  HENT:  Positive for postnasal drip and rhinorrhea. Negative for congestion, dental problem, drooling, ear discharge, facial swelling, nosebleeds, sinus pressure, sinus pain, sore throat and trouble swallowing.   Eyes:  Negative for visual disturbance.  Respiratory:  Negative for cough, shortness of breath and wheezing.   Cardiovascular:  Negative for chest pain and leg swelling.   Gastrointestinal:  Negative for abdominal pain, constipation, diarrhea, nausea and vomiting.  Genitourinary:  Negative for dysuria and urgency.  Musculoskeletal:  Positive for arthralgias and gait problem. Negative for back pain, myalgias and neck pain.  Skin:  Negative for rash.  Neurological:  Positive for speech difficulty and weakness.  Psychiatric/Behavioral:  Negative for agitation, behavioral problems and confusion.    Immunization History  Administered Date(s) Administered   Influenza Whole 08/06/2012   Influenza, High Dose Seasonal PF 08/15/2019, 08/27/2020   Influenza,inj,Quad PF,6+ Mos 08/30/2018   Influenza-Unspecified 08/06/2013, 08/24/2014, 08/26/2015, 08/31/2016, 08/27/2017, 08/27/2020   Moderna Sars-Covid-2 Vaccination 11/16/2019, 12/16/2019, 09/16/2020   Pneumococcal Conjugate-13 09/22/2015   Pneumococcal Polysaccharide-23 08/29/2006   Td 10/07/2007   Zoster Recombinat (Shingrix) 02/12/2018, 05/13/2018   Zoster, Live 03/12/2008   Pertinent  Health Maintenance Due  Topic Date Due   INFLUENZA VACCINE  06/06/2021   DEXA SCAN  Completed   Fall Risk  09/23/2020 08/26/2020 07/28/2020 02/25/2020 01/28/2020  Falls in the past year? 1 1 0 0 0  Number falls in past yr: 0 0 1 0 0  Comment - - - - -  Injury with Fall? 1 1 0 0 0  Comment Patient fractured pelvis and had a stroke - - - -  Risk for fall due to : - History of fall(s);Impaired balance/gait;Impaired mobility;Orthopedic patient - - -  Follow up - Falls evaluation completed;Education provided;Falls prevention discussed;Follow up appointment - - -   Functional Status Survey:    Vitals:   07/28/21 1041  BP: 127/77  Pulse: 67  Resp: 18  Temp: (!) 97 F (36.1 C)  SpO2: 94%  Weight: 127 lb (57.6 kg)  Height: 5\' 6"  (1.676 m)   Body mass index is 20.5 kg/m. Physical Exam Vitals and nursing note reviewed.  Constitutional:      General: She is not in acute distress.    Appearance: She is not diaphoretic.   HENT:     Head: Normocephalic and atraumatic.     Nose: Congestion and rhinorrhea present.     Mouth/Throat:     Mouth: Mucous membranes are moist.     Pharynx: Oropharynx is clear. No oropharyngeal exudate.  Eyes:     Conjunctiva/sclera: Conjunctivae normal.     Pupils: Pupils are equal, round, and reactive to light.  Neck:     Vascular: No JVD.  Cardiovascular:     Rate and Rhythm: Normal rate. Rhythm irregular.     Heart sounds: No murmur heard. Pulmonary:     Effort: Pulmonary effort is normal. No respiratory distress.  Breath sounds: Normal breath sounds. No wheezing.  Abdominal:     General: Abdomen is flat. Bowel sounds are normal.     Palpations: Abdomen is soft.  Musculoskeletal:     Cervical back: No rigidity or tenderness.     Right lower leg: No edema.     Left lower leg: No edema.  Lymphadenopathy:     Cervical: No cervical adenopathy.  Skin:    General: Skin is warm and dry.  Neurological:     Mental Status: She is alert and oriented to person, place, and time.  Psychiatric:        Mood and Affect: Mood normal.    Labs reviewed: Recent Labs    08/17/20 0731 08/18/20 0255 10/12/20 0000 03/15/21 0000 04/17/21 0000 04/25/21 0000  NA 142 137   < > 134* 134* 135*  K 3.7 3.9   < > 4.0 3.3* 4.0  CL 105 106   < > 95* 94* 91*  CO2 24 23   < > 29* 30* 32*  GLUCOSE 112* 134*  --   --   --   --   BUN 24* 21   < > 26* 26* 23*  CREATININE 0.89 1.01*   < > 0.9 1.1 1.1  CALCIUM 9.3 8.7*   < > 9.2 9.0 9.8   < > = values in this interval not displayed.   Recent Labs    08/17/20 0731 08/18/20 0255  AST 32 26  ALT 24 21  ALKPHOS 62 53  BILITOT 1.2 1.2  PROT 6.5 6.1*  ALBUMIN 3.7 3.4*   Recent Labs    08/17/20 0731 08/17/20 1322 08/18/20 0255 08/23/20 0521 08/23/20 1621 08/30/20 0000 10/12/20 0000 03/22/21 0000 04/17/21 0000 05/20/21 0000  WBC 15.5* 12.7* 15.9* 16.9*  --  13.9   < > 5.4 8.3 5.7  NEUTROABS 11.9* 11.1*  --   --   --  10.40  --    --   --   --   HGB 11.2* 9.7* 9.1* 7.3*   < > 9.6*   < > 13.4 12.9 12.7  HCT 35.0* 31.3* 29.2* 24.1*   < > 29*   < > 40 38 37  MCV 92.3 93.4 94.5 97.6  --   --   --   --   --   --   PLT 84* 81* 81* 138*  --  197   < > 126* 92* 121*   < > = values in this interval not displayed.   Lab Results  Component Value Date   TSH 2.48 03/22/2021   Lab Results  Component Value Date   HGBA1C 5.7 (H) 08/19/2020   Lab Results  Component Value Date   CHOL 166 07/22/2020   HDL 53 07/22/2020   LDLCALC 106 03/25/2019   LDLDIRECT 81.3 08/19/2020   TRIG 54 07/22/2020   CHOLHDL 2 09/12/2010    Significant Diagnostic Results in last 30 days:  No results found.  Assessment/Plan  1. Chronic rhinitis Continue Claritin and fluticasone If not improving, could try atrovent spray  2. Chronic pain of right knee Controlled with voltaren gel  3. Thrombocytopenia ([place]) Lab Results  Component Value Date   PLT 121 (A) 05/20/2021  Continue to monitor at regular intervals    4. 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 Permanent atrial fibrillation ([facility]) Rate is controlled with metoprolol and digoxin. Last dig level on the ow side.  Not on anticoagulation due to bleeding  6. HTN Controlled Continue norvasc, metoprolol   Family/ staff Communication: discussed with resident  Labs/tests ordered:  NA

## 2021-07-29 DIAGNOSIS — M6389 Disorders of muscle in diseases classified elsewhere, multiple sites: Secondary | ICD-10-CM | POA: Diagnosis not present

## 2021-07-29 DIAGNOSIS — M84350S Stress fracture, pelvis, sequela: Secondary | ICD-10-CM | POA: Diagnosis not present

## 2021-07-29 DIAGNOSIS — I6389 Other cerebral infarction: Secondary | ICD-10-CM | POA: Diagnosis not present

## 2021-07-29 DIAGNOSIS — R278 Other lack of coordination: Secondary | ICD-10-CM | POA: Diagnosis not present

## 2021-07-29 DIAGNOSIS — Z9181 History of falling: Secondary | ICD-10-CM | POA: Diagnosis not present

## 2021-08-01 DIAGNOSIS — M6389 Disorders of muscle in diseases classified elsewhere, multiple sites: Secondary | ICD-10-CM | POA: Diagnosis not present

## 2021-08-01 DIAGNOSIS — M84350S Stress fracture, pelvis, sequela: Secondary | ICD-10-CM | POA: Diagnosis not present

## 2021-08-01 DIAGNOSIS — R278 Other lack of coordination: Secondary | ICD-10-CM | POA: Diagnosis not present

## 2021-08-01 DIAGNOSIS — Z9181 History of falling: Secondary | ICD-10-CM | POA: Diagnosis not present

## 2021-08-01 DIAGNOSIS — I6389 Other cerebral infarction: Secondary | ICD-10-CM | POA: Diagnosis not present

## 2021-08-02 DIAGNOSIS — M6389 Disorders of muscle in diseases classified elsewhere, multiple sites: Secondary | ICD-10-CM | POA: Diagnosis not present

## 2021-08-02 DIAGNOSIS — M84350S Stress fracture, pelvis, sequela: Secondary | ICD-10-CM | POA: Diagnosis not present

## 2021-08-02 DIAGNOSIS — I6389 Other cerebral infarction: Secondary | ICD-10-CM | POA: Diagnosis not present

## 2021-08-02 DIAGNOSIS — R278 Other lack of coordination: Secondary | ICD-10-CM | POA: Diagnosis not present

## 2021-08-02 DIAGNOSIS — Z9181 History of falling: Secondary | ICD-10-CM | POA: Diagnosis not present

## 2021-08-03 DIAGNOSIS — M84350S Stress fracture, pelvis, sequela: Secondary | ICD-10-CM | POA: Diagnosis not present

## 2021-08-03 DIAGNOSIS — M6389 Disorders of muscle in diseases classified elsewhere, multiple sites: Secondary | ICD-10-CM | POA: Diagnosis not present

## 2021-08-03 DIAGNOSIS — I6389 Other cerebral infarction: Secondary | ICD-10-CM | POA: Diagnosis not present

## 2021-08-03 DIAGNOSIS — R278 Other lack of coordination: Secondary | ICD-10-CM | POA: Diagnosis not present

## 2021-08-03 DIAGNOSIS — Z9181 History of falling: Secondary | ICD-10-CM | POA: Diagnosis not present

## 2021-08-04 DIAGNOSIS — M6389 Disorders of muscle in diseases classified elsewhere, multiple sites: Secondary | ICD-10-CM | POA: Diagnosis not present

## 2021-08-04 DIAGNOSIS — Z9181 History of falling: Secondary | ICD-10-CM | POA: Diagnosis not present

## 2021-08-04 DIAGNOSIS — R278 Other lack of coordination: Secondary | ICD-10-CM | POA: Diagnosis not present

## 2021-08-04 DIAGNOSIS — M84350S Stress fracture, pelvis, sequela: Secondary | ICD-10-CM | POA: Diagnosis not present

## 2021-08-04 DIAGNOSIS — I6389 Other cerebral infarction: Secondary | ICD-10-CM | POA: Diagnosis not present

## 2021-08-05 ENCOUNTER — Encounter: Payer: Self-pay | Admitting: Adult Health

## 2021-08-05 ENCOUNTER — Non-Acute Institutional Stay (SKILLED_NURSING_FACILITY): Payer: Medicare Other | Admitting: Adult Health

## 2021-08-05 DIAGNOSIS — M6389 Disorders of muscle in diseases classified elsewhere, multiple sites: Secondary | ICD-10-CM | POA: Diagnosis not present

## 2021-08-05 DIAGNOSIS — U071 COVID-19: Secondary | ICD-10-CM | POA: Diagnosis not present

## 2021-08-05 DIAGNOSIS — J310 Chronic rhinitis: Secondary | ICD-10-CM | POA: Diagnosis not present

## 2021-08-05 DIAGNOSIS — R278 Other lack of coordination: Secondary | ICD-10-CM | POA: Diagnosis not present

## 2021-08-05 DIAGNOSIS — Z79899 Other long term (current) drug therapy: Secondary | ICD-10-CM | POA: Diagnosis not present

## 2021-08-05 DIAGNOSIS — Z9181 History of falling: Secondary | ICD-10-CM | POA: Diagnosis not present

## 2021-08-05 DIAGNOSIS — I6389 Other cerebral infarction: Secondary | ICD-10-CM | POA: Diagnosis not present

## 2021-08-05 DIAGNOSIS — M84350S Stress fracture, pelvis, sequela: Secondary | ICD-10-CM | POA: Diagnosis not present

## 2021-08-05 LAB — BASIC METABOLIC PANEL
BUN: 26 — AB (ref 4–21)
CO2: 31 — AB (ref 13–22)
Chloride: 96 — AB (ref 99–108)
Creatinine: 1.1 (ref 0.5–1.1)
Glucose: 94
Potassium: 3.6 (ref 3.4–5.3)
Sodium: 136 — AB (ref 137–147)

## 2021-08-05 LAB — COMPREHENSIVE METABOLIC PANEL: Calcium: 9.1 (ref 8.7–10.7)

## 2021-08-05 MED ORDER — NIRMATRELVIR/RITONAVIR (PAXLOVID) TABLET (RENAL DOSING): 2.0000 | ORAL_TABLET | Freq: Two times a day (BID) | ORAL | 0 refills | Status: AC

## 2021-08-05 NOTE — Progress Notes (Addendum)
Location:  [facility] of Service:  SNF (31) Provider:   Cindi Carbon, [place] [phone]   [name], MD  Patient Care Team: [name], MD as PCP - General (Internal Medicine) [name], MD as PCP - Electrophysiology (Cardiology) [name], MD as Consulting Physician (Dermatology) [name], MD as Consulting Physician (Cardiology) [facility] [name], MD as Consulting Physician (Dermatology)  Extended Emergency Contact Information Primary Emergency Contact: [name] of [place] Phone: [phone] Mobile Phone: [phone] Relation: Daughter Secondary Emergency Contact: [name],[name]  [name] of [place] Phone: [phone] Relation: Son  Code Status:  DNR Goals of care: Advanced Directive information Advanced Directives 07/28/2021  Does Patient Have a Medical Advance Directive? Yes  Type of Paramedic of [place];Living will;Out of facility DNR (pink MOST or yellow form)  Does patient want to make changes to medical advance directive? No - Patient declined  Copy of [place] in Chart? Yes - validated most recent copy scanned in chart (See row information)  Pre-existing out of facility DNR order (yellow form or pink MOST form) Yellow form placed in chart (order not valid for inpatient use);Pink MOST form placed in chart (order not valid for inpatient use)     Chief Complaint  Patient presents with   Acute Visit    Runny nose    HPI:  Pt is a 87 y.o. female seen today for an acute visit for runny nose. Pt has had symptoms of a runny nose off and on for the past month. She has a hx of seasonal allergies. Rapid covid was negative on 8/24.  She tried claritin and fluticasone spray and thought that helped and so this was scheduled two weeks ago. She now feels she is getting worse with worsening  runny nose congestion,and mild cough. No sputum production, fever, sore throat, sob, etc. She does not feel ill and has not had any known sick contacts.  Vaccinated for covid x 3  Past Medical History:  Diagnosis Date   Actinic keratosis 05/25/2014   Anxiety    Atrial fibrillation ([place]) 09/21/2010   Basal cell carcinoma 05/25/2014   Multiple removed by Dr. Danella Sensing in March 2015: right neck, left neck, scalp    Bell's palsy 07/18/1979   Cervicalgia 01/31/2012   Closed fracture of lumbar vertebra without mention of spinal cord injury 07/17/2005   Conjunctiva disorder 12/26/2010   Coronary atherosclerosis of native coronary artery 07/17/1997   Cramp of limb 08/16/2009   Degeneration of lumbar or lumbosacral intervertebral disc 01/31/2012   Disturbance of skin sensation 08/2009   Dizziness and giddiness 11/08/2011   vertigo   External hemorrhoids without mention of complication 8/54/6270   External hemorrhoids without mention of complication 35/00/9381   Ganglion of tendon sheath 08/16/2009   Leg cramp 10/27/2013   Most frequently the right leg.    Long term (current) use of anticoagulants 09/2010   Lumbago 07/2009   Meralgia paresthetica 07/2007   MI, old    Myalgia and myositis, unspecified 01/17/2012   Osteoporosis    Other abnormal blood chemistry 04/08/2012   Other abnormal blood chemistry 2013   hyperglycemia   Other disorder of muscle, ligament, and fascia 04/08/2012   Other specified cardiac dysrhythmias(427.89) 06/27/2010   Pacemaker 05/06/2020   Pain in joint, ankle and foot 10/27/2013   Bilateral since 1998    Pain in joint, shoulder  region 08/12/2012   Pain in joint, upper arm 12/26/2010   Pain in limb 01/11/2011   Pain in thoracic spine 01/31/2012   Pathologic fracture of vertebrae 01/22/2012   PVC's (premature ventricular contractions)    Rash and other nonspecific skin eruption 08/02/2011   Senile osteoporosis 07/17/1993   Stroke ([place])    Unspecified essential hypertension  07/17/1997   Varicose veins of lower extremities 08/16/2009   Varicose veins of lower extremities 08/16/2009   Past Surgical History:  Procedure Laterality Date   COLONOSCOPY WITH PROPOFOL N/A 10/19/2015   Procedure: COLONOSCOPY WITH PROPOFOL;  Surgeon: Gatha Mayer, MD;  [facility];  Service: Endoscopy;  Laterality: N/A;   CORONARY ARTERY BYPASS GRAFT  1998   x2; LIMA to LAD; SVG to diagonal off bypass   HEMORRHOID SURGERY  08/26/2012   Dr. Brantley Stage   LOOP RECORDER INSERTION N/A 05/13/2018   Procedure: LOOP RECORDER INSERTION;  Surgeon: [name], MD;  Location: [facility];  Service: Cardiovascular;  Laterality: N/A;   LOOP RECORDER REMOVAL N/A 05/26/2019   Procedure: LOOP RECORDER REMOVAL;  Surgeon: [name], MD;  Location: [facility];  Service: Cardiovascular;  Laterality: N/A;   MASS EXCISION Left 11/23/2015   Procedure: LEFT WRIST EXCISION CYST;  Surgeon: Leanora Cover, MD;  Location: [facility];  Service: Orthopedics;  Laterality: Left;   PACEMAKER IMPLANT N/A 05/26/2019   Procedure: PACEMAKER IMPLANT;  Surgeon: [name], MD;  Location: [facility];  Service: Cardiovascular;  Laterality: N/A;   SKIN BIOPSY  01/29/14   (R) neck; (R) scalp, 2 (L) neck; shave biopsy Superficial basal cell carcinoma Dr. Danella Sensing   TONSILLECTOMY  [phone]    Allergies  Allergen Reactions   Iodinated Diagnostic Agents Nausea Only and Other (See Comments)    Severe nausea and also passed out   Iodine    Other     Seasonal Allergies.   Bactrim Rash   Relafen [Nabumetone] Rash   Sulfanilamide Rash    Outpatient Encounter Medications as of 08/05/2021  Medication Sig   acetaminophen (TYLENOL) 500 MG tablet Take 1,000 mg by mouth every 6 (six) hours as needed for mild pain (pain.).    amLODipine (NORVASC) 5 MG tablet Take 5 mg by mouth daily.   aspirin EC 81 MG EC tablet Take 1 tablet (81 mg total) by mouth daily. Swallow whole.    atenolol (TENORMIN) 50 MG tablet Take 50 mg by mouth 2 (two) times daily. HOLD FOR SPB <90 or HR< 60   calcium-vitamin D (OSCAL WITH D) 500-200 MG-UNIT tablet Take 1 tablet by mouth daily.   diclofenac Sodium (VOLTAREN) 1 % GEL Apply 4 g topically 4 (four) times daily as needed. Left knee, right knee, left ankle   digoxin (LANOXIN) 0.125 MG tablet Take 0.125 mg by mouth every other day.   fluticasone (FLONASE) 50 MCG/ACT nasal spray Place 1 spray into both nostrils 2 (two) times daily.   hydrALAZINE (APRESOLINE) 10 MG tablet Take 10 mg by mouth 3 (three) times daily as needed. TID PRN for SBP >170   hydrochlorothiazide (HYDRODIURIL) 50 MG tablet Take 50 mg by mouth daily.   loratadine (CLARITIN) 10 MG tablet Take 10 mg by mouth at bedtime.   LORazepam (ATIVAN) 1 MG tablet Take 1 tablet (1 mg total) by mouth at bedtime.   ondansetron (ZOFRAN) 4 MG tablet Take 1 tablet (4 mg total) by mouth every 6 (six) hours as  needed for nausea.   polyethylene glycol (MIRALAX / GLYCOLAX) 17 g packet Take 17 g by mouth every other day.   potassium chloride SA (KLOR-CON) 20 MEQ tablet Take 20 mEq by mouth daily.   traMADol (ULTRAM) 50 MG tablet Take 1 tablet (50 mg total) by mouth every 6 (six) hours as needed for moderate pain or severe pain.   No facility-administered encounter medications on file as of 08/05/2021.    Review of Systems  Constitutional:  Negative for chills, diaphoresis and fever.  HENT:  Positive for congestion, postnasal drip, rhinorrhea and voice change. Negative for ear discharge, ear pain, facial swelling, sinus pressure, sinus pain, sneezing, sore throat, tinnitus and trouble swallowing.   Respiratory:  Positive for cough. Negative for shortness of breath and wheezing.   Cardiovascular:  Negative for chest pain and leg swelling.  Gastrointestinal:  Negative for abdominal pain, constipation, diarrhea, nausea and vomiting.  Genitourinary:  Negative for dysuria and urgency.   Musculoskeletal:  Positive for gait problem. Negative for back pain, myalgias and neck pain.  Skin:  Negative for rash.  Neurological:  Positive for weakness (due to CVA).   Immunization History  Administered Date(s) Administered   Influenza Whole 08/06/2012   Influenza, High Dose Seasonal PF 08/15/2019, 08/27/2020   Influenza,inj,Quad PF,6+ Mos 08/30/2018   Influenza-Unspecified 08/06/2013, 08/24/2014, 08/26/2015, 08/31/2016, 08/27/2017, 08/27/2020   Moderna Sars-Covid-2 Vaccination 11/16/2019, 12/16/2019, 09/16/2020   Pneumococcal Conjugate-13 09/22/2015   Pneumococcal Polysaccharide-23 08/29/2006   Td 10/07/2007   Zoster Recombinat (Shingrix) 02/12/2018, 05/13/2018   Zoster, Live 03/12/2008   Pertinent  Health Maintenance Due  Topic Date Due   INFLUENZA VACCINE  06/06/2021   DEXA SCAN  Completed   Fall Risk  09/23/2020 08/26/2020 07/28/2020 02/25/2020 01/28/2020  Falls in the past year? 1 1 0 0 0  Number falls in past yr: 0 0 1 0 0  Comment - - - - -  Injury with Fall? 1 1 0 0 0  Comment Patient fractured pelvis and had a stroke - - - -  Risk for fall due to : - History of fall(s);Impaired balance/gait;Impaired mobility;Orthopedic patient - - -  Follow up - Falls evaluation completed;Education provided;Falls prevention discussed;Follow up appointment - - -   Functional Status Survey:    Vitals:   08/05/21 1027  BP: 134/73  Pulse: 71  Resp: 18  Temp: (!) 96.8 F (36 C)  SpO2: 93%   There is no height or weight on file to calculate BMI. Physical Exam Vitals and nursing note reviewed.  Constitutional:      General: She is not in acute distress.    Appearance: She is not diaphoretic.  HENT:     Head: Normocephalic and atraumatic.     Nose: Congestion and rhinorrhea present.     Mouth/Throat:     Mouth: Mucous membranes are moist.     Pharynx: Oropharynx is clear.  Eyes:     Conjunctiva/sclera: Conjunctivae normal.     Pupils: Pupils are equal, round, and  reactive to light.  Neck:     Vascular: No JVD.  Cardiovascular:     Rate and Rhythm: Normal rate. Rhythm irregular.     Heart sounds: No murmur heard. Pulmonary:     Effort: Pulmonary effort is normal. No respiratory distress.     Breath sounds: Normal breath sounds. No wheezing.  Skin:    General: Skin is warm and dry.  Neurological:     Mental Status: She is alert and oriented  to person, place, and time.  Psychiatric:        Mood and Affect: Mood normal.    Labs reviewed: Recent Labs    08/17/20 0731 08/18/20 0255 10/12/20 0000 03/15/21 0000 04/17/21 0000 04/25/21 0000  NA 142 137   < > 134* 134* 135*  K 3.7 3.9   < > 4.0 3.3* 4.0  CL 105 106   < > 95* 94* 91*  CO2 24 23   < > 29* 30* 32*  GLUCOSE 112* 134*  --   --   --   --   BUN 24* 21   < > 26* 26* 23*  CREATININE 0.89 1.01*   < > 0.9 1.1 1.1  CALCIUM 9.3 8.7*   < > 9.2 9.0 9.8   < > = values in this interval not displayed.   Recent Labs    08/17/20 0731 08/18/20 0255  AST 32 26  ALT 24 21  ALKPHOS 62 53  BILITOT 1.2 1.2  PROT 6.5 6.1*  ALBUMIN 3.7 3.4*   Recent Labs    08/17/20 0731 08/17/20 1322 08/18/20 0255 08/23/20 0521 08/23/20 1621 08/30/20 0000 10/12/20 0000 03/22/21 0000 04/17/21 0000 05/20/21 0000  WBC 15.5* 12.7* 15.9* 16.9*  --  13.9   < > 5.4 8.3 5.7  NEUTROABS 11.9* 11.1*  --   --   --  10.40  --   --   --   --   HGB 11.2* 9.7* 9.1* 7.3*   < > 9.6*   < > 13.4 12.9 12.7  HCT 35.0* 31.3* 29.2* 24.1*   < > 29*   < > 40 38 37  MCV 92.3 93.4 94.5 97.6  --   --   --   --   --   --   PLT 84* 81* 81* 138*  --  197   < > 126* 92* 121*   < > = values in this interval not displayed.   Lab Results  Component Value Date   TSH 2.48 03/22/2021   Lab Results  Component Value Date   HGBA1C 5.7 (H) 08/19/2020   Lab Results  Component Value Date   CHOL 166 07/22/2020   HDL 53 07/22/2020   LDLCALC 106 03/25/2019   LDLDIRECT 81.3 08/19/2020   TRIG 54 07/22/2020   CHOLHDL 2 09/12/2010     Significant Diagnostic Results in last 30 days:  No results found.  Assessment/Plan 1. COVID-19 virus infection Rapid covid test positive Place on enhanced isolation for 10 days [facility] covid protocol for symptom management.  Paxlovid renal dosing per pharmacy. May need dose reduction of digoxin. GFR 30  2. Chronic rhinitis D/C Fluticasone, try Atrovent spray with meals.       Family/ staff Communication: discussed with her nurse and Ms. Koke  Labs/tests ordered:  covid swab

## 2021-08-10 DIAGNOSIS — R059 Cough, unspecified: Secondary | ICD-10-CM | POA: Diagnosis not present

## 2021-08-15 ENCOUNTER — Encounter: Payer: Self-pay | Admitting: Internal Medicine

## 2021-08-15 ENCOUNTER — Non-Acute Institutional Stay (SKILLED_NURSING_FACILITY): Payer: Medicare Other | Admitting: Internal Medicine

## 2021-08-15 DIAGNOSIS — U099 Post covid-19 condition, unspecified: Secondary | ICD-10-CM | POA: Diagnosis not present

## 2021-08-15 DIAGNOSIS — R052 Subacute cough: Secondary | ICD-10-CM

## 2021-08-15 DIAGNOSIS — E871 Hypo-osmolality and hyponatremia: Secondary | ICD-10-CM | POA: Diagnosis not present

## 2021-08-15 DIAGNOSIS — I10 Essential (primary) hypertension: Secondary | ICD-10-CM | POA: Diagnosis not present

## 2021-08-15 DIAGNOSIS — E876 Hypokalemia: Secondary | ICD-10-CM | POA: Diagnosis not present

## 2021-08-15 DIAGNOSIS — D696 Thrombocytopenia, unspecified: Secondary | ICD-10-CM | POA: Diagnosis not present

## 2021-08-15 DIAGNOSIS — I4821 Permanent atrial fibrillation: Secondary | ICD-10-CM | POA: Diagnosis not present

## 2021-08-15 DIAGNOSIS — I69354 Hemiplegia and hemiparesis following cerebral infarction affecting left non-dominant side: Secondary | ICD-10-CM

## 2021-08-15 NOTE — Progress Notes (Signed)
Location:  [place] [facility] Room Number: 118 Place of Service:  SNF [phone])  Provider: Veleta Miners MD  Code Status: DNR Goals of Care:  Advanced Directives 08/15/2021  Does Patient Have a Medical Advance Directive? Yes  Type of Paramedic of [place];Living will;Out of facility DNR (pink MOST or yellow form)  Does patient want to make changes to medical advance directive? No - Patient declined  Copy of [place] in Chart? Yes - validated most recent copy scanned in chart (See row information)  Pre-existing out of facility DNR order (yellow form or pink MOST form) Yellow form placed in chart (order not valid for inpatient use);Pink MOST form placed in chart (order not valid for inpatient use)     Chief Complaint  Patient presents with   Acute Visit    HPI: Patient is a 87 y.o. female seen today for medical management of chronic diseases.    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  With extraperitoneal hemorrhage in 10/21 Patient was  off Eliquis before her stroke due to ruptured Baker's cyst with hemorrhage before her embolic stroke  Diagnosed with Covid on 9/30 when she was having worsning of her Rhinitis Refused Paxlovid Has recovered but now has some cough mostly dry No Chest pain or fever or SOB Appetite is good  Past Medical History:  Diagnosis Date   Actinic keratosis 05/25/2014   Anxiety    Atrial fibrillation ([facility]) 09/21/2010   Basal cell carcinoma 05/25/2014   Multiple removed by Dr. Danella Sensing in March 2015: right neck, left neck, scalp    Bell's palsy 07/18/1979   Cervicalgia 01/31/2012    Closed fracture of lumbar vertebra without mention of spinal cord injury 07/17/2005   Conjunctiva disorder 12/26/2010   Coronary atherosclerosis of native coronary artery 07/17/1997   Cramp of limb 08/16/2009   Degeneration of lumbar or lumbosacral intervertebral disc 01/31/2012   Disturbance of skin sensation 08/2009   Dizziness and giddiness 11/08/2011   vertigo   External hemorrhoids without mention of complication 05/04/4764   External hemorrhoids without mention of complication 46/50/3546   Ganglion of tendon sheath 08/16/2009   Leg cramp 10/27/2013   Most frequently the right leg.    Long term (current) use of anticoagulants 09/2010   Lumbago 07/2009   Meralgia paresthetica 07/2007   MI, old    Myalgia and myositis, unspecified 01/17/2012   Osteoporosis    Other abnormal blood chemistry 04/08/2012   Other abnormal blood chemistry 2013   hyperglycemia   Other disorder of muscle, ligament, and fascia 04/08/2012   Other specified cardiac dysrhythmias(427.89) 06/27/2010   Pacemaker 05/06/2020   Pain in joint, ankle and foot 10/27/2013   Bilateral since 1998    Pain in joint, shoulder region 08/12/2012   Pain in joint, upper arm 12/26/2010   Pain in limb 01/11/2011   Pain in thoracic spine 01/31/2012   Pathologic fracture of vertebrae 01/22/2012   PVC's (premature ventricular contractions)    Rash and other nonspecific skin eruption 08/02/2011   Senile osteoporosis 07/17/1993   Stroke ([place])    Unspecified essential hypertension 07/17/1997   Varicose veins of lower extremities 08/16/2009   Varicose veins  of lower extremities 08/16/2009    Past Surgical History:  Procedure Laterality Date   COLONOSCOPY WITH PROPOFOL N/A 10/19/2015   Procedure: COLONOSCOPY WITH PROPOFOL;  Surgeon: Gatha Mayer, MD;  [facility];  Service: Endoscopy;  Laterality: N/A;   CORONARY ARTERY BYPASS GRAFT  1998   x2; LIMA to LAD; SVG to diagonal off bypass   HEMORRHOID SURGERY  08/26/2012   Dr. Brantley Stage    LOOP RECORDER INSERTION N/A 05/13/2018   Procedure: LOOP RECORDER INSERTION;  Surgeon: [name], MD;  Location: [facility];  Service: Cardiovascular;  Laterality: N/A;   LOOP RECORDER REMOVAL N/A 05/26/2019   Procedure: LOOP RECORDER REMOVAL;  Surgeon: [name], MD;  Location: [facility];  Service: Cardiovascular;  Laterality: N/A;   MASS EXCISION Left 11/23/2015   Procedure: LEFT WRIST EXCISION CYST;  Surgeon: Leanora Cover, MD;  Location: [facility];  Service: Orthopedics;  Laterality: Left;   PACEMAKER IMPLANT N/A 05/26/2019   Procedure: PACEMAKER IMPLANT;  Surgeon: [name], MD;  Location: [facility];  Service: Cardiovascular;  Laterality: N/A;   SKIN BIOPSY  01/29/14   (R) neck; (R) scalp, 2 (L) neck; shave biopsy Superficial basal cell carcinoma Dr. Danella Sensing   TONSILLECTOMY  [phone]    Allergies  Allergen Reactions   Iodinated Diagnostic Agents Nausea Only and Other (See Comments)    Severe nausea and also passed out   Iodine    Other     Seasonal Allergies.   Bactrim Rash   Relafen [Nabumetone] Rash   Sulfanilamide Rash    Outpatient Encounter Medications as of 08/15/2021  Medication Sig   acetaminophen (TYLENOL) 500 MG tablet Take 1,000 mg by mouth every 6 (six) hours as needed for mild pain (pain.).    amLODipine (NORVASC) 5 MG tablet Take 5 mg by mouth daily.   aspirin EC 81 MG EC tablet Take 1 tablet (81 mg total) by mouth daily. Swallow whole.   atenolol (TENORMIN) 50 MG tablet Take 50 mg by mouth 2 (two) times daily. HOLD FOR SPB <90 or HR< 60   calcium-vitamin D (OSCAL WITH D) 500-200 MG-UNIT tablet Take 1 tablet by mouth daily.   diclofenac Sodium (VOLTAREN) 1 % GEL Apply 4 g topically 4 (four) times daily as needed. Left knee, right knee, left ankle   digoxin (LANOXIN) 0.125 MG tablet Take 0.125 mg by mouth every other day.   guaiFENesin (ROBITUSSIN) 100 MG/5ML SOLN Take 5 mLs by mouth every 6 (six) hours as  needed for cough or to loosen phlegm.   hydrALAZINE (APRESOLINE) 10 MG tablet Take 10 mg by mouth 3 (three) times daily as needed. TID PRN for SBP >170   hydrochlorothiazide (HYDRODIURIL) 50 MG tablet Take 50 mg by mouth daily.   ipratropium (ATROVENT) 0.03 % nasal spray Place 2 sprays into both nostrils 3 (three) times daily. With meals x 1 month   loratadine (CLARITIN) 10 MG tablet Take 10 mg by mouth at bedtime.   LORazepam (ATIVAN) 1 MG tablet Take 1 tablet (1 mg total) by mouth at bedtime.   ondansetron (ZOFRAN) 4 MG tablet Take 1 tablet (4 mg total) by mouth every 6 (six) hours as needed for nausea.   polyethylene glycol (MIRALAX / GLYCOLAX) 17 g packet Take 17 g by mouth every other day.   potassium chloride SA (KLOR-CON) 20 MEQ tablet Take 20 mEq by mouth daily.   traMADol (ULTRAM) 50 MG tablet Take  1 tablet (50 mg total) by mouth every 6 (six) hours as needed for moderate pain or severe pain.   No facility-administered encounter medications on file as of 08/15/2021.    Review of Systems:  Review of Systems  Constitutional: Negative.   HENT:  Positive for congestion and rhinorrhea.   Respiratory:  Positive for cough.   Cardiovascular: Negative.   Gastrointestinal: Negative.   Genitourinary: Negative.   Musculoskeletal:  Positive for gait problem.  Skin: Negative.   Neurological:  Negative for dizziness.  Psychiatric/Behavioral: Negative.     Health Maintenance  Topic Date Due   TETANUS/TDAP  10/06/2017   COVID-19 Vaccine (4 - Booster for Moderna series) 12/09/2020   INFLUENZA VACCINE  06/06/2021   DEXA SCAN  Completed   Zoster Vaccines- Shingrix  Completed   HPV VACCINES  Aged Out    Physical Exam: Vitals:   08/15/21 1609  BP: 134/73  Pulse: 71  Resp: 18  Temp: (!) 96.8 F (36 C)  SpO2: 93%  Weight: 127 lb (57.6 kg)  Height: 5\' 6"  (1.676 m)   Body mass index is 20.5 kg/m. Physical Exam Vitals reviewed.  Constitutional:      Appearance: Normal appearance.   HENT:     Head: Normocephalic.     Comments: No Sinus tenderness    Nose: Nose normal.     Mouth/Throat:     Mouth: Mucous membranes are moist.     Pharynx: Oropharynx is clear.  Eyes:     Pupils: Pupils are equal, round, and reactive to light.  Cardiovascular:     Rate and Rhythm: Normal rate. Rhythm irregular.     Pulses: Normal pulses.     Heart sounds: Normal heart sounds.  Pulmonary:     Effort: Pulmonary effort is normal.     Comments: Continues to have few rales in Left Lung Base No Wheezing Abdominal:     General: Abdomen is flat. Bowel sounds are normal.     Palpations: Abdomen is soft.  Musculoskeletal:        General: No swelling.     Cervical back: Neck supple.  Skin:    General: Skin is warm and dry.  Neurological:     Mental Status: She is alert and oriented to person, place, and time.     Comments: Left Flaccid Hemiparesis Walks with Therpy and her hemi walker Cannot raise her left Leg  Psychiatric:        Mood and Affect: Mood normal.        Thought Content: Thought content normal.    Labs reviewed: Basic Metabolic Panel: Recent Labs    08/17/20 0731 08/18/20 0255 10/12/20 0000 03/22/21 0000 04/17/21 0000 04/25/21 0000 08/05/21 0000  NA 142 137   < >  --  134* 135* 136*  K 3.7 3.9   < >  --  3.3* 4.0 3.6  CL 105 106   < >  --  94* 91* 96*  CO2 24 23   < >  --  30* 32* 31*  GLUCOSE 112* 134*  --   --   --   --   --   BUN 24* 21   < >  --  26* 23* 26*  CREATININE 0.89 1.01*   < >  --  1.1 1.1 1.1  CALCIUM 9.3 8.7*   < >  --  9.0 9.8 9.1  TSH  --   --   --  2.48  --   --   --    < > =  values in this interval not displayed.   Liver Function Tests: Recent Labs    08/17/20 0731 08/18/20 0255  AST 32 26  ALT 24 21  ALKPHOS 62 53  BILITOT 1.2 1.2  PROT 6.5 6.1*  ALBUMIN 3.7 3.4*   No results for input(s): LIPASE, AMYLASE in the last 8760 hours. No results for input(s): AMMONIA in the last 8760 hours. CBC: Recent Labs    08/17/20 0731  08/17/20 1322 08/18/20 0255 08/23/20 0521 08/23/20 1621 08/30/20 0000 10/12/20 0000 03/22/21 0000 04/17/21 0000 05/20/21 0000  WBC 15.5* 12.7* 15.9* 16.9*  --  13.9   < > 5.4 8.3 5.7  NEUTROABS 11.9* 11.1*  --   --   --  10.40  --   --   --   --   HGB 11.2* 9.7* 9.1* 7.3*   < > 9.6*   < > 13.4 12.9 12.7  HCT 35.0* 31.3* 29.2* 24.1*   < > 29*   < > 40 38 37  MCV 92.3 93.4 94.5 97.6  --   --   --   --   --   --   PLT 84* 81* 81* 138*  --  197   < > 126* 92* 121*   < > = values in this interval not displayed.   Lipid Panel: Recent Labs    08/19/20 1825  LDLDIRECT 81.3   Lab Results  Component Value Date   HGBA1C 5.7 (H) 08/19/2020    Procedures since last visit: No results found.  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

## 2021-08-16 DIAGNOSIS — Z9181 History of falling: Secondary | ICD-10-CM | POA: Diagnosis not present

## 2021-08-16 DIAGNOSIS — M6389 Disorders of muscle in diseases classified elsewhere, multiple sites: Secondary | ICD-10-CM | POA: Diagnosis not present

## 2021-08-16 DIAGNOSIS — I6389 Other cerebral infarction: Secondary | ICD-10-CM | POA: Diagnosis not present

## 2021-08-16 DIAGNOSIS — Z79899 Other long term (current) drug therapy: Secondary | ICD-10-CM | POA: Diagnosis not present

## 2021-08-16 DIAGNOSIS — M84350S Stress fracture, pelvis, sequela: Secondary | ICD-10-CM | POA: Diagnosis not present

## 2021-08-16 DIAGNOSIS — R278 Other lack of coordination: Secondary | ICD-10-CM | POA: Diagnosis not present

## 2021-08-16 LAB — HEPATIC FUNCTION PANEL
ALT: 23 (ref 7–35)
AST: 30 (ref 13–35)
Alkaline Phosphatase: 87 (ref 25–125)
Bilirubin, Total: 0.5

## 2021-08-16 LAB — BASIC METABOLIC PANEL WITH GFR
BUN: 18 (ref 4–21)
CO2: 29 — AB (ref 13–22)
Chloride: 92 — AB (ref 99–108)
Creatinine: 0.8 (ref 0.5–1.1)
Glucose: 91
Potassium: 4.4 (ref 3.4–5.3)
Sodium: 133 — AB (ref 137–147)

## 2021-08-16 LAB — LIPID PANEL
Cholesterol: 133 (ref 0–200)
HDL: 29 — AB (ref 35–70)
LDL Cholesterol: 94
LDl/HDL Ratio: 4.6
Triglycerides: 52 (ref 40–160)

## 2021-08-16 LAB — CBC: RBC: 3.72 — AB (ref 3.87–5.11)

## 2021-08-16 LAB — COMPREHENSIVE METABOLIC PANEL
Albumin: 3.1 — AB (ref 3.5–5.0)
Calcium: 8.9 (ref 8.7–10.7)
Globulin: 3.8

## 2021-08-16 LAB — CBC AND DIFFERENTIAL
HCT: 32 — AB (ref 36–46)
Hemoglobin: 10.8 — AB (ref 12.0–16.0)
Platelets: 165 (ref 150–399)
WBC: 11

## 2021-08-17 DIAGNOSIS — M84350S Stress fracture, pelvis, sequela: Secondary | ICD-10-CM | POA: Diagnosis not present

## 2021-08-17 DIAGNOSIS — Z9181 History of falling: Secondary | ICD-10-CM | POA: Diagnosis not present

## 2021-08-17 DIAGNOSIS — R278 Other lack of coordination: Secondary | ICD-10-CM | POA: Diagnosis not present

## 2021-08-17 DIAGNOSIS — I6389 Other cerebral infarction: Secondary | ICD-10-CM | POA: Diagnosis not present

## 2021-08-17 DIAGNOSIS — M6389 Disorders of muscle in diseases classified elsewhere, multiple sites: Secondary | ICD-10-CM | POA: Diagnosis not present

## 2021-08-18 DIAGNOSIS — R278 Other lack of coordination: Secondary | ICD-10-CM | POA: Diagnosis not present

## 2021-08-18 DIAGNOSIS — M84350S Stress fracture, pelvis, sequela: Secondary | ICD-10-CM | POA: Diagnosis not present

## 2021-08-18 DIAGNOSIS — I6389 Other cerebral infarction: Secondary | ICD-10-CM | POA: Diagnosis not present

## 2021-08-18 DIAGNOSIS — Z9181 History of falling: Secondary | ICD-10-CM | POA: Diagnosis not present

## 2021-08-18 DIAGNOSIS — M6389 Disorders of muscle in diseases classified elsewhere, multiple sites: Secondary | ICD-10-CM | POA: Diagnosis not present

## 2021-08-19 DIAGNOSIS — R278 Other lack of coordination: Secondary | ICD-10-CM | POA: Diagnosis not present

## 2021-08-19 DIAGNOSIS — I69322 Dysarthria following cerebral infarction: Secondary | ICD-10-CM | POA: Diagnosis not present

## 2021-08-19 DIAGNOSIS — I6389 Other cerebral infarction: Secondary | ICD-10-CM | POA: Diagnosis not present

## 2021-08-19 DIAGNOSIS — M6389 Disorders of muscle in diseases classified elsewhere, multiple sites: Secondary | ICD-10-CM | POA: Diagnosis not present

## 2021-08-19 DIAGNOSIS — R1312 Dysphagia, oropharyngeal phase: Secondary | ICD-10-CM | POA: Diagnosis not present

## 2021-08-19 DIAGNOSIS — M84350S Stress fracture, pelvis, sequela: Secondary | ICD-10-CM | POA: Diagnosis not present

## 2021-08-19 DIAGNOSIS — Z9181 History of falling: Secondary | ICD-10-CM | POA: Diagnosis not present

## 2021-08-19 DIAGNOSIS — R1311 Dysphagia, oral phase: Secondary | ICD-10-CM | POA: Diagnosis not present

## 2021-08-19 DIAGNOSIS — I69354 Hemiplegia and hemiparesis following cerebral infarction affecting left non-dominant side: Secondary | ICD-10-CM | POA: Diagnosis not present

## 2021-08-19 DIAGNOSIS — K219 Gastro-esophageal reflux disease without esophagitis: Secondary | ICD-10-CM | POA: Diagnosis not present

## 2021-08-22 DIAGNOSIS — R1311 Dysphagia, oral phase: Secondary | ICD-10-CM | POA: Diagnosis not present

## 2021-08-22 DIAGNOSIS — K219 Gastro-esophageal reflux disease without esophagitis: Secondary | ICD-10-CM | POA: Diagnosis not present

## 2021-08-22 DIAGNOSIS — M84350S Stress fracture, pelvis, sequela: Secondary | ICD-10-CM | POA: Diagnosis not present

## 2021-08-22 DIAGNOSIS — I69354 Hemiplegia and hemiparesis following cerebral infarction affecting left non-dominant side: Secondary | ICD-10-CM | POA: Diagnosis not present

## 2021-08-22 DIAGNOSIS — R1312 Dysphagia, oropharyngeal phase: Secondary | ICD-10-CM | POA: Diagnosis not present

## 2021-08-22 DIAGNOSIS — R278 Other lack of coordination: Secondary | ICD-10-CM | POA: Diagnosis not present

## 2021-08-22 DIAGNOSIS — I69322 Dysarthria following cerebral infarction: Secondary | ICD-10-CM | POA: Diagnosis not present

## 2021-08-22 DIAGNOSIS — M6389 Disorders of muscle in diseases classified elsewhere, multiple sites: Secondary | ICD-10-CM | POA: Diagnosis not present

## 2021-08-22 DIAGNOSIS — I6389 Other cerebral infarction: Secondary | ICD-10-CM | POA: Diagnosis not present

## 2021-08-22 DIAGNOSIS — Z9181 History of falling: Secondary | ICD-10-CM | POA: Diagnosis not present

## 2021-08-23 DIAGNOSIS — R1311 Dysphagia, oral phase: Secondary | ICD-10-CM | POA: Diagnosis not present

## 2021-08-23 DIAGNOSIS — M6389 Disorders of muscle in diseases classified elsewhere, multiple sites: Secondary | ICD-10-CM | POA: Diagnosis not present

## 2021-08-23 DIAGNOSIS — K219 Gastro-esophageal reflux disease without esophagitis: Secondary | ICD-10-CM | POA: Diagnosis not present

## 2021-08-23 DIAGNOSIS — R278 Other lack of coordination: Secondary | ICD-10-CM | POA: Diagnosis not present

## 2021-08-23 DIAGNOSIS — I69322 Dysarthria following cerebral infarction: Secondary | ICD-10-CM | POA: Diagnosis not present

## 2021-08-23 DIAGNOSIS — R1312 Dysphagia, oropharyngeal phase: Secondary | ICD-10-CM | POA: Diagnosis not present

## 2021-08-23 DIAGNOSIS — I6389 Other cerebral infarction: Secondary | ICD-10-CM | POA: Diagnosis not present

## 2021-08-23 DIAGNOSIS — M84350S Stress fracture, pelvis, sequela: Secondary | ICD-10-CM | POA: Diagnosis not present

## 2021-08-23 DIAGNOSIS — Z9181 History of falling: Secondary | ICD-10-CM | POA: Diagnosis not present

## 2021-08-23 DIAGNOSIS — I69354 Hemiplegia and hemiparesis following cerebral infarction affecting left non-dominant side: Secondary | ICD-10-CM | POA: Diagnosis not present

## 2021-08-24 DIAGNOSIS — M84350S Stress fracture, pelvis, sequela: Secondary | ICD-10-CM | POA: Diagnosis not present

## 2021-08-24 DIAGNOSIS — I69354 Hemiplegia and hemiparesis following cerebral infarction affecting left non-dominant side: Secondary | ICD-10-CM | POA: Diagnosis not present

## 2021-08-24 DIAGNOSIS — I69322 Dysarthria following cerebral infarction: Secondary | ICD-10-CM | POA: Diagnosis not present

## 2021-08-24 DIAGNOSIS — I6389 Other cerebral infarction: Secondary | ICD-10-CM | POA: Diagnosis not present

## 2021-08-24 DIAGNOSIS — K219 Gastro-esophageal reflux disease without esophagitis: Secondary | ICD-10-CM | POA: Diagnosis not present

## 2021-08-24 DIAGNOSIS — M6389 Disorders of muscle in diseases classified elsewhere, multiple sites: Secondary | ICD-10-CM | POA: Diagnosis not present

## 2021-08-24 DIAGNOSIS — R1312 Dysphagia, oropharyngeal phase: Secondary | ICD-10-CM | POA: Diagnosis not present

## 2021-08-24 DIAGNOSIS — Z9181 History of falling: Secondary | ICD-10-CM | POA: Diagnosis not present

## 2021-08-24 DIAGNOSIS — R278 Other lack of coordination: Secondary | ICD-10-CM | POA: Diagnosis not present

## 2021-08-24 DIAGNOSIS — R1311 Dysphagia, oral phase: Secondary | ICD-10-CM | POA: Diagnosis not present

## 2021-08-25 ENCOUNTER — Encounter: Payer: Medicare Other | Admitting: Physician Assistant

## 2021-08-25 DIAGNOSIS — R1312 Dysphagia, oropharyngeal phase: Secondary | ICD-10-CM | POA: Diagnosis not present

## 2021-08-25 DIAGNOSIS — K219 Gastro-esophageal reflux disease without esophagitis: Secondary | ICD-10-CM | POA: Diagnosis not present

## 2021-08-25 DIAGNOSIS — I69354 Hemiplegia and hemiparesis following cerebral infarction affecting left non-dominant side: Secondary | ICD-10-CM | POA: Diagnosis not present

## 2021-08-25 DIAGNOSIS — I69322 Dysarthria following cerebral infarction: Secondary | ICD-10-CM | POA: Diagnosis not present

## 2021-08-25 DIAGNOSIS — R1311 Dysphagia, oral phase: Secondary | ICD-10-CM | POA: Diagnosis not present

## 2021-08-26 DIAGNOSIS — R1311 Dysphagia, oral phase: Secondary | ICD-10-CM | POA: Diagnosis not present

## 2021-08-26 DIAGNOSIS — R1312 Dysphagia, oropharyngeal phase: Secondary | ICD-10-CM | POA: Diagnosis not present

## 2021-08-26 DIAGNOSIS — I69354 Hemiplegia and hemiparesis following cerebral infarction affecting left non-dominant side: Secondary | ICD-10-CM | POA: Diagnosis not present

## 2021-08-26 DIAGNOSIS — K219 Gastro-esophageal reflux disease without esophagitis: Secondary | ICD-10-CM | POA: Diagnosis not present

## 2021-08-26 DIAGNOSIS — I69322 Dysarthria following cerebral infarction: Secondary | ICD-10-CM | POA: Diagnosis not present

## 2021-08-30 DIAGNOSIS — R1311 Dysphagia, oral phase: Secondary | ICD-10-CM | POA: Diagnosis not present

## 2021-08-30 DIAGNOSIS — I69354 Hemiplegia and hemiparesis following cerebral infarction affecting left non-dominant side: Secondary | ICD-10-CM | POA: Diagnosis not present

## 2021-08-30 DIAGNOSIS — M6389 Disorders of muscle in diseases classified elsewhere, multiple sites: Secondary | ICD-10-CM | POA: Diagnosis not present

## 2021-08-30 DIAGNOSIS — I69322 Dysarthria following cerebral infarction: Secondary | ICD-10-CM | POA: Diagnosis not present

## 2021-08-30 DIAGNOSIS — R278 Other lack of coordination: Secondary | ICD-10-CM | POA: Diagnosis not present

## 2021-08-30 DIAGNOSIS — I6389 Other cerebral infarction: Secondary | ICD-10-CM | POA: Diagnosis not present

## 2021-08-30 DIAGNOSIS — K219 Gastro-esophageal reflux disease without esophagitis: Secondary | ICD-10-CM | POA: Diagnosis not present

## 2021-08-30 DIAGNOSIS — Z9181 History of falling: Secondary | ICD-10-CM | POA: Diagnosis not present

## 2021-08-30 DIAGNOSIS — R1312 Dysphagia, oropharyngeal phase: Secondary | ICD-10-CM | POA: Diagnosis not present

## 2021-08-30 DIAGNOSIS — M84350S Stress fracture, pelvis, sequela: Secondary | ICD-10-CM | POA: Diagnosis not present

## 2021-08-31 DIAGNOSIS — R1311 Dysphagia, oral phase: Secondary | ICD-10-CM | POA: Diagnosis not present

## 2021-08-31 DIAGNOSIS — R2689 Other abnormalities of gait and mobility: Secondary | ICD-10-CM | POA: Diagnosis not present

## 2021-08-31 DIAGNOSIS — R278 Other lack of coordination: Secondary | ICD-10-CM | POA: Diagnosis not present

## 2021-08-31 DIAGNOSIS — I69354 Hemiplegia and hemiparesis following cerebral infarction affecting left non-dominant side: Secondary | ICD-10-CM | POA: Diagnosis not present

## 2021-08-31 DIAGNOSIS — M150 Primary generalized (osteo)arthritis: Secondary | ICD-10-CM | POA: Diagnosis not present

## 2021-08-31 DIAGNOSIS — I69322 Dysarthria following cerebral infarction: Secondary | ICD-10-CM | POA: Diagnosis not present

## 2021-08-31 DIAGNOSIS — K219 Gastro-esophageal reflux disease without esophagitis: Secondary | ICD-10-CM | POA: Diagnosis not present

## 2021-08-31 DIAGNOSIS — R1312 Dysphagia, oropharyngeal phase: Secondary | ICD-10-CM | POA: Diagnosis not present

## 2021-09-01 DIAGNOSIS — R2689 Other abnormalities of gait and mobility: Secondary | ICD-10-CM | POA: Diagnosis not present

## 2021-09-01 DIAGNOSIS — I69354 Hemiplegia and hemiparesis following cerebral infarction affecting left non-dominant side: Secondary | ICD-10-CM | POA: Diagnosis not present

## 2021-09-01 DIAGNOSIS — K219 Gastro-esophageal reflux disease without esophagitis: Secondary | ICD-10-CM | POA: Diagnosis not present

## 2021-09-01 DIAGNOSIS — M6389 Disorders of muscle in diseases classified elsewhere, multiple sites: Secondary | ICD-10-CM | POA: Diagnosis not present

## 2021-09-01 DIAGNOSIS — Z9181 History of falling: Secondary | ICD-10-CM | POA: Diagnosis not present

## 2021-09-01 DIAGNOSIS — I6389 Other cerebral infarction: Secondary | ICD-10-CM | POA: Diagnosis not present

## 2021-09-01 DIAGNOSIS — M84350S Stress fracture, pelvis, sequela: Secondary | ICD-10-CM | POA: Diagnosis not present

## 2021-09-01 DIAGNOSIS — I69322 Dysarthria following cerebral infarction: Secondary | ICD-10-CM | POA: Diagnosis not present

## 2021-09-01 DIAGNOSIS — R1311 Dysphagia, oral phase: Secondary | ICD-10-CM | POA: Diagnosis not present

## 2021-09-01 DIAGNOSIS — M150 Primary generalized (osteo)arthritis: Secondary | ICD-10-CM | POA: Diagnosis not present

## 2021-09-01 DIAGNOSIS — R278 Other lack of coordination: Secondary | ICD-10-CM | POA: Diagnosis not present

## 2021-09-01 DIAGNOSIS — R1312 Dysphagia, oropharyngeal phase: Secondary | ICD-10-CM | POA: Diagnosis not present

## 2021-09-02 ENCOUNTER — Ambulatory Visit (INDEPENDENT_AMBULATORY_CARE_PROVIDER_SITE_OTHER): Payer: Medicare Other

## 2021-09-02 DIAGNOSIS — R1312 Dysphagia, oropharyngeal phase: Secondary | ICD-10-CM | POA: Diagnosis not present

## 2021-09-02 DIAGNOSIS — I442 Atrioventricular block, complete: Secondary | ICD-10-CM | POA: Diagnosis not present

## 2021-09-02 DIAGNOSIS — I6389 Other cerebral infarction: Secondary | ICD-10-CM | POA: Diagnosis not present

## 2021-09-02 DIAGNOSIS — M84350S Stress fracture, pelvis, sequela: Secondary | ICD-10-CM | POA: Diagnosis not present

## 2021-09-02 DIAGNOSIS — R278 Other lack of coordination: Secondary | ICD-10-CM | POA: Diagnosis not present

## 2021-09-02 DIAGNOSIS — M6389 Disorders of muscle in diseases classified elsewhere, multiple sites: Secondary | ICD-10-CM | POA: Diagnosis not present

## 2021-09-02 DIAGNOSIS — I69354 Hemiplegia and hemiparesis following cerebral infarction affecting left non-dominant side: Secondary | ICD-10-CM | POA: Diagnosis not present

## 2021-09-02 DIAGNOSIS — I69322 Dysarthria following cerebral infarction: Secondary | ICD-10-CM | POA: Diagnosis not present

## 2021-09-02 DIAGNOSIS — K219 Gastro-esophageal reflux disease without esophagitis: Secondary | ICD-10-CM | POA: Diagnosis not present

## 2021-09-02 DIAGNOSIS — R1311 Dysphagia, oral phase: Secondary | ICD-10-CM | POA: Diagnosis not present

## 2021-09-02 DIAGNOSIS — Z9181 History of falling: Secondary | ICD-10-CM | POA: Diagnosis not present

## 2021-09-02 LAB — CUP PACEART REMOTE DEVICE CHECK
Battery Remaining Longevity: 118 mo
Battery Remaining Percentage: 87 %
Battery Voltage: 3.02 V
Brady Statistic RV Percent Paced: 5.1 %
Date Time Interrogation Session: 20221028020013
Implantable Lead Implant Date: 20200720
Implantable Lead Location: 753860
Implantable Pulse Generator Implant Date: 20200720
Lead Channel Impedance Value: 560 Ohm
Lead Channel Pacing Threshold Amplitude: 1 V
Lead Channel Pacing Threshold Pulse Width: 0.5 ms
Lead Channel Sensing Intrinsic Amplitude: 7 mV
Lead Channel Setting Pacing Amplitude: 2.5 V
Lead Channel Setting Pacing Pulse Width: 0.5 ms
Lead Channel Setting Sensing Sensitivity: 2 mV
Pulse Gen Model: 1272
Pulse Gen Serial Number: 9122450

## 2021-09-05 DIAGNOSIS — K219 Gastro-esophageal reflux disease without esophagitis: Secondary | ICD-10-CM | POA: Diagnosis not present

## 2021-09-05 DIAGNOSIS — I69322 Dysarthria following cerebral infarction: Secondary | ICD-10-CM | POA: Diagnosis not present

## 2021-09-05 DIAGNOSIS — R2689 Other abnormalities of gait and mobility: Secondary | ICD-10-CM | POA: Diagnosis not present

## 2021-09-05 DIAGNOSIS — M84350S Stress fracture, pelvis, sequela: Secondary | ICD-10-CM | POA: Diagnosis not present

## 2021-09-05 DIAGNOSIS — R1312 Dysphagia, oropharyngeal phase: Secondary | ICD-10-CM | POA: Diagnosis not present

## 2021-09-05 DIAGNOSIS — R278 Other lack of coordination: Secondary | ICD-10-CM | POA: Diagnosis not present

## 2021-09-05 DIAGNOSIS — M6389 Disorders of muscle in diseases classified elsewhere, multiple sites: Secondary | ICD-10-CM | POA: Diagnosis not present

## 2021-09-05 DIAGNOSIS — I6389 Other cerebral infarction: Secondary | ICD-10-CM | POA: Diagnosis not present

## 2021-09-05 DIAGNOSIS — I69354 Hemiplegia and hemiparesis following cerebral infarction affecting left non-dominant side: Secondary | ICD-10-CM | POA: Diagnosis not present

## 2021-09-05 DIAGNOSIS — R1311 Dysphagia, oral phase: Secondary | ICD-10-CM | POA: Diagnosis not present

## 2021-09-05 DIAGNOSIS — Z9181 History of falling: Secondary | ICD-10-CM | POA: Diagnosis not present

## 2021-09-05 DIAGNOSIS — M150 Primary generalized (osteo)arthritis: Secondary | ICD-10-CM | POA: Diagnosis not present

## 2021-09-06 DIAGNOSIS — R2689 Other abnormalities of gait and mobility: Secondary | ICD-10-CM | POA: Diagnosis not present

## 2021-09-06 DIAGNOSIS — M150 Primary generalized (osteo)arthritis: Secondary | ICD-10-CM | POA: Diagnosis not present

## 2021-09-06 DIAGNOSIS — I69354 Hemiplegia and hemiparesis following cerebral infarction affecting left non-dominant side: Secondary | ICD-10-CM | POA: Diagnosis not present

## 2021-09-06 DIAGNOSIS — R278 Other lack of coordination: Secondary | ICD-10-CM | POA: Diagnosis not present

## 2021-09-07 DIAGNOSIS — R278 Other lack of coordination: Secondary | ICD-10-CM | POA: Diagnosis not present

## 2021-09-07 DIAGNOSIS — Z9181 History of falling: Secondary | ICD-10-CM | POA: Diagnosis not present

## 2021-09-07 DIAGNOSIS — M6389 Disorders of muscle in diseases classified elsewhere, multiple sites: Secondary | ICD-10-CM | POA: Diagnosis not present

## 2021-09-07 DIAGNOSIS — M84350S Stress fracture, pelvis, sequela: Secondary | ICD-10-CM | POA: Diagnosis not present

## 2021-09-07 DIAGNOSIS — I6389 Other cerebral infarction: Secondary | ICD-10-CM | POA: Diagnosis not present

## 2021-09-08 DIAGNOSIS — K219 Gastro-esophageal reflux disease without esophagitis: Secondary | ICD-10-CM | POA: Diagnosis not present

## 2021-09-08 DIAGNOSIS — I69322 Dysarthria following cerebral infarction: Secondary | ICD-10-CM | POA: Diagnosis not present

## 2021-09-08 DIAGNOSIS — I6389 Other cerebral infarction: Secondary | ICD-10-CM | POA: Diagnosis not present

## 2021-09-08 DIAGNOSIS — R1311 Dysphagia, oral phase: Secondary | ICD-10-CM | POA: Diagnosis not present

## 2021-09-08 DIAGNOSIS — Z9181 History of falling: Secondary | ICD-10-CM | POA: Diagnosis not present

## 2021-09-08 DIAGNOSIS — M6389 Disorders of muscle in diseases classified elsewhere, multiple sites: Secondary | ICD-10-CM | POA: Diagnosis not present

## 2021-09-08 DIAGNOSIS — M84350S Stress fracture, pelvis, sequela: Secondary | ICD-10-CM | POA: Diagnosis not present

## 2021-09-08 DIAGNOSIS — R278 Other lack of coordination: Secondary | ICD-10-CM | POA: Diagnosis not present

## 2021-09-08 DIAGNOSIS — M150 Primary generalized (osteo)arthritis: Secondary | ICD-10-CM | POA: Diagnosis not present

## 2021-09-08 DIAGNOSIS — R2689 Other abnormalities of gait and mobility: Secondary | ICD-10-CM | POA: Diagnosis not present

## 2021-09-08 DIAGNOSIS — R1312 Dysphagia, oropharyngeal phase: Secondary | ICD-10-CM | POA: Diagnosis not present

## 2021-09-08 DIAGNOSIS — I69354 Hemiplegia and hemiparesis following cerebral infarction affecting left non-dominant side: Secondary | ICD-10-CM | POA: Diagnosis not present

## 2021-09-09 DIAGNOSIS — R278 Other lack of coordination: Secondary | ICD-10-CM | POA: Diagnosis not present

## 2021-09-09 DIAGNOSIS — M6389 Disorders of muscle in diseases classified elsewhere, multiple sites: Secondary | ICD-10-CM | POA: Diagnosis not present

## 2021-09-09 DIAGNOSIS — I6389 Other cerebral infarction: Secondary | ICD-10-CM | POA: Diagnosis not present

## 2021-09-09 DIAGNOSIS — M84350S Stress fracture, pelvis, sequela: Secondary | ICD-10-CM | POA: Diagnosis not present

## 2021-09-09 DIAGNOSIS — Z9181 History of falling: Secondary | ICD-10-CM | POA: Diagnosis not present

## 2021-09-12 DIAGNOSIS — I69354 Hemiplegia and hemiparesis following cerebral infarction affecting left non-dominant side: Secondary | ICD-10-CM | POA: Diagnosis not present

## 2021-09-12 DIAGNOSIS — M150 Primary generalized (osteo)arthritis: Secondary | ICD-10-CM | POA: Diagnosis not present

## 2021-09-12 DIAGNOSIS — M6389 Disorders of muscle in diseases classified elsewhere, multiple sites: Secondary | ICD-10-CM | POA: Diagnosis not present

## 2021-09-12 DIAGNOSIS — Z9181 History of falling: Secondary | ICD-10-CM | POA: Diagnosis not present

## 2021-09-12 DIAGNOSIS — I6389 Other cerebral infarction: Secondary | ICD-10-CM | POA: Diagnosis not present

## 2021-09-12 DIAGNOSIS — R2689 Other abnormalities of gait and mobility: Secondary | ICD-10-CM | POA: Diagnosis not present

## 2021-09-12 DIAGNOSIS — M84350S Stress fracture, pelvis, sequela: Secondary | ICD-10-CM | POA: Diagnosis not present

## 2021-09-12 DIAGNOSIS — R278 Other lack of coordination: Secondary | ICD-10-CM | POA: Diagnosis not present

## 2021-09-12 NOTE — Progress Notes (Signed)
Remote pacemaker transmission.   

## 2021-09-14 DIAGNOSIS — R278 Other lack of coordination: Secondary | ICD-10-CM | POA: Diagnosis not present

## 2021-09-14 DIAGNOSIS — I69322 Dysarthria following cerebral infarction: Secondary | ICD-10-CM | POA: Diagnosis not present

## 2021-09-14 DIAGNOSIS — R1311 Dysphagia, oral phase: Secondary | ICD-10-CM | POA: Diagnosis not present

## 2021-09-14 DIAGNOSIS — K219 Gastro-esophageal reflux disease without esophagitis: Secondary | ICD-10-CM | POA: Diagnosis not present

## 2021-09-14 DIAGNOSIS — M84350S Stress fracture, pelvis, sequela: Secondary | ICD-10-CM | POA: Diagnosis not present

## 2021-09-14 DIAGNOSIS — R1312 Dysphagia, oropharyngeal phase: Secondary | ICD-10-CM | POA: Diagnosis not present

## 2021-09-14 DIAGNOSIS — I6389 Other cerebral infarction: Secondary | ICD-10-CM | POA: Diagnosis not present

## 2021-09-14 DIAGNOSIS — I69354 Hemiplegia and hemiparesis following cerebral infarction affecting left non-dominant side: Secondary | ICD-10-CM | POA: Diagnosis not present

## 2021-09-14 DIAGNOSIS — M6389 Disorders of muscle in diseases classified elsewhere, multiple sites: Secondary | ICD-10-CM | POA: Diagnosis not present

## 2021-09-14 DIAGNOSIS — R2689 Other abnormalities of gait and mobility: Secondary | ICD-10-CM | POA: Diagnosis not present

## 2021-09-14 DIAGNOSIS — M150 Primary generalized (osteo)arthritis: Secondary | ICD-10-CM | POA: Diagnosis not present

## 2021-09-14 DIAGNOSIS — Z9181 History of falling: Secondary | ICD-10-CM | POA: Diagnosis not present

## 2021-09-15 DIAGNOSIS — I69354 Hemiplegia and hemiparesis following cerebral infarction affecting left non-dominant side: Secondary | ICD-10-CM | POA: Diagnosis not present

## 2021-09-15 DIAGNOSIS — R278 Other lack of coordination: Secondary | ICD-10-CM | POA: Diagnosis not present

## 2021-09-15 DIAGNOSIS — R2689 Other abnormalities of gait and mobility: Secondary | ICD-10-CM | POA: Diagnosis not present

## 2021-09-15 DIAGNOSIS — M150 Primary generalized (osteo)arthritis: Secondary | ICD-10-CM | POA: Diagnosis not present

## 2021-09-19 DIAGNOSIS — R1312 Dysphagia, oropharyngeal phase: Secondary | ICD-10-CM | POA: Diagnosis not present

## 2021-09-19 DIAGNOSIS — Z9181 History of falling: Secondary | ICD-10-CM | POA: Diagnosis not present

## 2021-09-19 DIAGNOSIS — R1311 Dysphagia, oral phase: Secondary | ICD-10-CM | POA: Diagnosis not present

## 2021-09-19 DIAGNOSIS — R278 Other lack of coordination: Secondary | ICD-10-CM | POA: Diagnosis not present

## 2021-09-19 DIAGNOSIS — I6389 Other cerebral infarction: Secondary | ICD-10-CM | POA: Diagnosis not present

## 2021-09-19 DIAGNOSIS — M6389 Disorders of muscle in diseases classified elsewhere, multiple sites: Secondary | ICD-10-CM | POA: Diagnosis not present

## 2021-09-19 DIAGNOSIS — K219 Gastro-esophageal reflux disease without esophagitis: Secondary | ICD-10-CM | POA: Diagnosis not present

## 2021-09-19 DIAGNOSIS — I69354 Hemiplegia and hemiparesis following cerebral infarction affecting left non-dominant side: Secondary | ICD-10-CM | POA: Diagnosis not present

## 2021-09-19 DIAGNOSIS — M84350S Stress fracture, pelvis, sequela: Secondary | ICD-10-CM | POA: Diagnosis not present

## 2021-09-19 DIAGNOSIS — I69322 Dysarthria following cerebral infarction: Secondary | ICD-10-CM | POA: Diagnosis not present

## 2021-09-20 ENCOUNTER — Encounter: Payer: Self-pay | Admitting: Orthopedic Surgery

## 2021-09-20 ENCOUNTER — Other Ambulatory Visit: Payer: Self-pay | Admitting: Orthopedic Surgery

## 2021-09-20 ENCOUNTER — Non-Acute Institutional Stay (SKILLED_NURSING_FACILITY): Payer: Medicare Other | Admitting: Orthopedic Surgery

## 2021-09-20 DIAGNOSIS — K5901 Slow transit constipation: Secondary | ICD-10-CM | POA: Diagnosis not present

## 2021-09-20 DIAGNOSIS — R052 Subacute cough: Secondary | ICD-10-CM

## 2021-09-20 DIAGNOSIS — B351 Tinea unguium: Secondary | ICD-10-CM

## 2021-09-20 DIAGNOSIS — D696 Thrombocytopenia, unspecified: Secondary | ICD-10-CM

## 2021-09-20 DIAGNOSIS — F419 Anxiety disorder, unspecified: Secondary | ICD-10-CM

## 2021-09-20 DIAGNOSIS — I4821 Permanent atrial fibrillation: Secondary | ICD-10-CM

## 2021-09-20 DIAGNOSIS — M25561 Pain in right knee: Secondary | ICD-10-CM

## 2021-09-20 DIAGNOSIS — I10 Essential (primary) hypertension: Secondary | ICD-10-CM | POA: Diagnosis not present

## 2021-09-20 DIAGNOSIS — J302 Other seasonal allergic rhinitis: Secondary | ICD-10-CM | POA: Diagnosis not present

## 2021-09-20 DIAGNOSIS — E876 Hypokalemia: Secondary | ICD-10-CM

## 2021-09-20 DIAGNOSIS — I69354 Hemiplegia and hemiparesis following cerebral infarction affecting left non-dominant side: Secondary | ICD-10-CM

## 2021-09-20 DIAGNOSIS — G8929 Other chronic pain: Secondary | ICD-10-CM

## 2021-09-20 DIAGNOSIS — E871 Hypo-osmolality and hyponatremia: Secondary | ICD-10-CM | POA: Diagnosis not present

## 2021-09-20 DIAGNOSIS — R1312 Dysphagia, oropharyngeal phase: Secondary | ICD-10-CM

## 2021-09-20 MED ORDER — LORAZEPAM 1 MG PO TABS: 1.0000 mg | ORAL_TABLET | Freq: Every day | ORAL | 5 refills | Status: DC

## 2021-09-20 NOTE — Progress Notes (Signed)
Location:  [facility] Room Number: 118-A Place of Service:  SNF (31) Provider:  Yvonna Alanis, NP   Patient Care Team: [name], MD as PCP - General (Internal Medicine) [name], MD as PCP - Electrophysiology (Cardiology) [name], MD as Consulting Physician (Dermatology) [name], MD as Consulting Physician (Cardiology) [name], MD as Consulting Physician (Dermatology)  Extended Emergency Contact Information Primary Emergency Contact: [name] of [place] Phone: [phone] Mobile Phone: [phone] Relation: Daughter Secondary Emergency Contact: [name],[name]  [name] of [place] Phone: [phone] Relation: Son  Code Status:  DNR Goals of care: Advanced Directive information Advanced Directives 09/20/2021  Does Patient Have a Medical Advance Directive? Yes  Type of Paramedic of [place];Living will;Out of facility DNR (pink MOST or yellow form)  Does patient want to make changes to medical advance directive? No - Patient declined  Copy of [place] in Chart? Yes - validated most recent copy scanned in chart (See row information)  Pre-existing out of facility DNR order (yellow form or pink MOST form) Yellow form placed in chart (order not valid for inpatient use);Pink MOST form placed in chart (order not valid for inpatient use)     Chief Complaint  Patient presents with   Medical Management of Chronic Issues    Routine visit and discuss need for tdap or postpone/exclude if patient refuses.     HPI:  Pt is a 87 y.o. female seen today for medical management of chronic diseases.    She currently resides on the skilled nursing unit at [facility]. Past medical history includes: atrial fibrillation, AV block, CAD, hypertension, GERD, stroke, left sided paralysis, constipation, and dysphagia.    Subacute cough- 09/30 tested positive for covid, refused paxlovid, symptoms included nasal congestion/cough, recovered except for cough, 10/10 started on mucinex per Dr. Lyndel Safe, cough has resolved at this time Nasal congestion- continues to have nasal congestion after covid, congestion clear, denies facial/dental pain, remains on Claritin, requesting to restart Flonase Onychomycosis- followed by podiatry- scheduled to be seen next month, remains on tea tree oil since May 2022 Left sided hemiplegia- ambulating with PT and daughter, uses PWC, one person assist with transfers, remains on aspirin, scheduled to see cardiology next month- will discuss Eliquis/statin, LDL 94 08/16/2021 Dysphagia- no recent aspirations, remains on regular diet HTN- BUN/creat 18.3/0.81 08/16/2021, elevated pressures sometimes in evening, remains on amlodipine/atenolol/hydralazine prn/ HCTZ Afib- rate controlled with digoxin, remains on aspirin for clot prevention Right knee pain- denies increased pain, remains on Tramadol prn Thrombocytopenia- platelet count 165 08/16/2021 Hypokalemia- K+ 4.4 08/16/2021 Hyponatremia- Na+ 133 08/16/2021, remains on HCTZ Constipation- LBM 11/13, remains on miralax QOD Anxiety- no recent panic attacks, increased anxiety in evening, lorazepam qhs  No recent falls, injuries or behavioral outbursts.   Recent blood pressures:  11/15- 134/80  11/10- 144/88, 151/72  11/09- 151/88, 145/76  Recent weights:  11/07- 128.3 lbs  10/12- 114 lbs  08/01- 127 lbs  07/01- 126.8 lbs   Past Medical History:  Diagnosis Date   Actinic keratosis 05/25/2014   Anxiety    Atrial fibrillation ([place]) 09/21/2010   Basal cell carcinoma 05/25/2014   Multiple removed by Dr. Danella Sensing in March 2015: right neck, left neck, scalp    Bell's palsy 07/18/1979   Cervicalgia 01/31/2012   Closed fracture of lumbar vertebra without mention of spinal cord injury 07/17/2005   Conjunctiva disorder 12/26/2010  Coronary atherosclerosis of native coronary artery 07/17/1997   Cramp of limb 08/16/2009   Degeneration of lumbar or lumbosacral intervertebral disc 01/31/2012   Disturbance of skin sensation 08/2009   Dizziness and giddiness 11/08/2011   vertigo   External hemorrhoids without mention of complication 01/17/9701   External hemorrhoids without mention of complication 63/78/5885   Ganglion of tendon sheath 08/16/2009   Leg cramp 10/27/2013   Most frequently the right leg.    Long term (current) use of anticoagulants 09/2010   Lumbago 07/2009   Meralgia paresthetica 07/2007   MI, old    Myalgia and myositis, unspecified 01/17/2012   Osteoporosis    Other abnormal blood chemistry 04/08/2012   Other abnormal blood chemistry 2013   hyperglycemia   Other disorder of muscle, ligament, and fascia 04/08/2012   Other specified cardiac dysrhythmias(427.89) 06/27/2010   Pacemaker 05/06/2020   Pain in joint, ankle and foot 10/27/2013   Bilateral since 1998    Pain in joint, shoulder region 08/12/2012   Pain in joint, upper arm 12/26/2010   Pain in limb 01/11/2011   Pain in thoracic spine 01/31/2012   Pathologic fracture of vertebrae 01/22/2012   PVC's (premature ventricular contractions)    Rash and other nonspecific skin eruption 08/02/2011   Senile osteoporosis 07/17/1993   Stroke ([place])    Unspecified essential hypertension 07/17/1997   Varicose veins of lower extremities 08/16/2009   Varicose veins of lower extremities 08/16/2009   Past Surgical History:  Procedure Laterality Date   COLONOSCOPY WITH PROPOFOL N/A 10/19/2015   Procedure: COLONOSCOPY WITH PROPOFOL;  Surgeon: Gatha Mayer, MD;  Location: [facility];  Service: Endoscopy;  Laterality: N/A;   CORONARY ARTERY BYPASS GRAFT  1998   x2; LIMA to LAD; SVG to diagonal off bypass   HEMORRHOID SURGERY  08/26/2012   Dr. Brantley Stage   LOOP RECORDER INSERTION N/A 05/13/2018   Procedure: LOOP RECORDER INSERTION;  Surgeon: [name], MD;  Location: [facility];  Service: Cardiovascular;  Laterality: N/A;   LOOP RECORDER REMOVAL N/A 05/26/2019   Procedure: LOOP RECORDER REMOVAL;  Surgeon: [name], MD;  Location: [facility];  Service: Cardiovascular;  Laterality: N/A;   MASS EXCISION Left 11/23/2015   Procedure: LEFT WRIST EXCISION CYST;  Surgeon: Leanora Cover, MD;  Location: [facility];  Service: Orthopedics;  Laterality: Left;   PACEMAKER IMPLANT N/A 05/26/2019   Procedure: PACEMAKER IMPLANT;  Surgeon: [name], MD;  Location: [facility];  Service: Cardiovascular;  Laterality: N/A;   SKIN BIOPSY  01/29/14   (R) neck; (R) scalp, 2 (L) neck; shave biopsy Superficial basal cell carcinoma Dr. Danella Sensing   TONSILLECTOMY  [phone]    Allergies  Allergen Reactions   Iodinated Diagnostic Agents Nausea Only and Other (See Comments)    Severe nausea and also passed out   Iodine    Other     Seasonal Allergies.   Bactrim Rash   Relafen [Nabumetone] Rash   Sulfanilamide Rash    Outpatient Encounter Medications as of 09/20/2021  Medication Sig   acetaminophen (TYLENOL) 500 MG tablet Take 1,000 mg by mouth every 6 (six) hours as needed for mild pain (pain.).    amLODipine (NORVASC) 5 MG tablet Take 5 mg by mouth daily.   aspirin EC 81 MG EC tablet Take 1 tablet (81 mg total) by mouth daily. Swallow whole.   atenolol (TENORMIN) 50 MG tablet Take 50 mg by mouth 2 (two)  times daily. HOLD FOR SPB <90 or HR< 60   calcium-vitamin D (OSCAL WITH D) 500-200 MG-UNIT tablet Take 1 tablet by mouth daily.   dextromethorphan-guaiFENesin (MUCINEX DM) 30-600 MG 12hr tablet Take 1 tablet by mouth 2 (two) times daily as needed for cough.   diclofenac Sodium (VOLTAREN) 1 % GEL Apply 4 g topically 4 (four) times daily as needed. Left knee, right knee, left ankle   digoxin (LANOXIN) 0.125 MG tablet Take 0.125 mg by mouth every other day.   hydrALAZINE (APRESOLINE) 10 MG tablet Take 10 mg by mouth 3 (three) times  daily as needed. TID PRN for SBP >170   hydrochlorothiazide (HYDRODIURIL) 50 MG tablet Take 50 mg by mouth daily.   loratadine (CLARITIN) 10 MG tablet Take 10 mg by mouth at bedtime.   LORazepam (ATIVAN) 1 MG tablet Take 1 tablet (1 mg total) by mouth at bedtime.   ondansetron (ZOFRAN) 4 MG tablet Take 1 tablet (4 mg total) by mouth every 6 (six) hours as needed for nausea.   polyethylene glycol (MIRALAX / GLYCOLAX) 17 g packet Take 17 g by mouth every other day.   potassium chloride SA (KLOR-CON) 20 MEQ tablet Take 20 mEq by mouth daily.   traMADol (ULTRAM) 50 MG tablet Take 1 tablet (50 mg total) by mouth every 6 (six) hours as needed for moderate pain or severe pain.   [DISCONTINUED] guaiFENesin (ROBITUSSIN) 100 MG/5ML SOLN Take 5 mLs by mouth every 6 (six) hours as needed for cough or to loosen phlegm.   [DISCONTINUED] ipratropium (ATROVENT) 0.03 % nasal spray Place 2 sprays into both nostrils 3 (three) times daily. With meals x 1 month   No facility-administered encounter medications on file as of 09/20/2021.    Review of Systems  Constitutional:  Negative for activity change, appetite change, chills, fatigue and fever.  HENT:  Positive for congestion. Negative for ear discharge, ear pain, sinus pressure, sinus pain, sneezing, sore throat and trouble swallowing.   Eyes:  Negative for visual disturbance.  Respiratory:  Negative for cough, shortness of breath and wheezing.   Cardiovascular:  Negative for chest pain and leg swelling.  Gastrointestinal:  Positive for constipation. Negative for abdominal distention, abdominal pain, blood in stool, diarrhea, nausea and vomiting.  Genitourinary:  Negative for dysuria, frequency and hematuria.  Musculoskeletal:  Positive for arthralgias and gait problem.  Skin: Negative.   Neurological:  Positive for weakness. Negative for dizziness and headaches.  Psychiatric/Behavioral:  Positive for sleep disturbance. Negative for confusion and dysphoric  mood. The patient is nervous/anxious.    Immunization History  Administered Date(s) Administered   Influenza Whole 08/06/2012   Influenza, High Dose Seasonal PF 08/15/2019, 08/27/2020   Influenza,inj,Quad PF,6+ Mos 08/30/2018   Influenza-Unspecified 08/06/2013, 08/24/2014, 08/26/2015, 08/31/2016, 08/27/2017, 08/27/2020   Moderna Sars-Covid-2 Vaccination 11/16/2019, 12/16/2019, 09/16/2020   Pneumococcal Conjugate-13 09/22/2015   Pneumococcal Polysaccharide-23 08/29/2006   Td 10/07/2007   Zoster Recombinat (Shingrix) 02/12/2018, 05/13/2018   Zoster, Live 03/12/2008   Pertinent  Health Maintenance Due  Topic Date Due   INFLUENZA VACCINE  02/03/2022 (Originally 06/06/2021)   DEXA SCAN  Completed   Fall Risk 08/22/2020 08/23/2020 08/23/2020 08/26/2020 09/23/2020  Falls in the past year? - - - 1 1  Number of falls in past year - - - - -  Was there an injury with Fall? - - - 1 1  Was there an injury with Fall? - - - - Patient fractured pelvis and had a stroke  Fall Risk  Category Calculator - - - 2 2  Fall Risk Category - - - Moderate Moderate  Patient Fall Risk Level High fall risk High fall risk High fall risk - Moderate fall risk  Patient at Risk for Falls Due to - - - History of fall(s);Impaired balance/gait;Impaired mobility;Orthopedic patient -  Fall risk Follow up - - - Falls evaluation completed;Education provided;Falls prevention discussed;Follow up appointment -   Functional Status Survey:    Vitals:   09/20/21 1241  BP: 134/80  Pulse: 79  Resp: 18  Temp: (!) 97.3 F (36.3 C)  SpO2: 96%  Weight: 128 lb 4.8 oz (58.2 kg)  Height: 5\' 6"  (1.676 m)   Body mass index is 20.71 kg/m. Physical Exam Vitals reviewed.  Constitutional:      General: She is not in acute distress.    Appearance: She is not ill-appearing.  HENT:     Head: Normocephalic.     Right Ear: There is no impacted cerumen.     Left Ear: There is no impacted cerumen.     Ears:     Comments: Bilateral  hearing aids    Nose: Nose normal.     Right Turbinates: Not enlarged.     Left Turbinates: Not enlarged.     Mouth/Throat:     Mouth: Mucous membranes are moist.  Eyes:     General:        Right eye: No discharge.        Left eye: No discharge.  Neck:     Vascular: No carotid bruit.  Cardiovascular:     Rate and Rhythm: Normal rate. Rhythm irregular.     Pulses: Normal pulses.     Heart sounds: Normal heart sounds. No murmur heard. Pulmonary:     Effort: Pulmonary effort is normal. No respiratory distress.     Breath sounds: Normal breath sounds. No wheezing.  Abdominal:     General: Bowel sounds are normal. There is no distension.     Palpations: Abdomen is soft.     Tenderness: There is no abdominal tenderness.  Musculoskeletal:     Cervical back: Normal range of motion.     Right lower leg: No edema.     Left lower leg: No edema.  Feet:     Right foot:     Toenail Condition: Right toenails are abnormally thick. Fungal disease present.    Left foot:     Toenail Condition: Left toenails are abnormally thick. Fungal disease present. Lymphadenopathy:     Cervical: No cervical adenopathy.  Skin:    General: Skin is warm and dry.     Capillary Refill: Capillary refill takes less than 2 seconds.  Neurological:     General: No focal deficit present.     Mental Status: She is alert and oriented to person, place, and time.     Motor: Weakness present.     Gait: Gait abnormal.     Comments: Left sided facial droop, left flaccid hemiparesis  Psychiatric:        Mood and Affect: Mood normal.        Behavior: Behavior normal.    Labs reviewed: Recent Labs    04/25/21 0000 08/05/21 0000 08/16/21 0000  NA 135* 136* 133*  K 4.0 3.6 4.4  CL 91* 96* 92*  CO2 32* 31* 29*  BUN 23* 26* 18  CREATININE 1.1 1.1 0.8  CALCIUM 9.8 9.1 8.9   Recent Labs    08/16/21 0000  AST  30  ALT 23  ALKPHOS 87  ALBUMIN 3.1*   Recent Labs    04/17/21 0000 05/20/21 0000  08/16/21 0000  WBC 8.3 5.7 11.0  HGB 12.9 12.7 10.8*  HCT 38 37 32*  PLT 92* 121* 165   Lab Results  Component Value Date   TSH 2.48 03/22/2021   Lab Results  Component Value Date   HGBA1C 5.7 (H) 08/19/2020   Lab Results  Component Value Date   CHOL 133 08/16/2021   HDL 29 (A) 08/16/2021   LDLCALC 94 08/16/2021   LDLDIRECT 81.3 08/19/2020   TRIG 52 08/16/2021   CHOLHDL 2 09/12/2010    Significant Diagnostic Results in last 30 days:  CUP PACEART REMOTE DEVICE CHECK  Result Date: 09/02/2021 Scheduled remote reviewed. Normal device function.  Next remote 91 days. LR   Assessment/Plan 1. Subacute cough - post covid - resolved - lung sound clear on auscultation - cont mucinex bid prn  2. Seasonal allergies - clear nasal drainage, no dental or facial pain, turbinates swollen - cont Claritin - start Flonase- 1 spay per nostril daily  3. Onychomycosis - followed by podiatry - no complaints today - she has been on tea tree oil since 03/2021 - reports debriding nails with Dr. Jacqualyn Posey most helpful - discontinue tea tree oil 10/05/2021- per patient request  4. 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 Oropharyngeal dysphagia - no recent aspirations - cont regular diet  6. Essential hypertension - controlled - some elevated pressures in evening - cont amlodipine/HCTZ/ atenolol/ hydralazine prn  7. Permanent atrial fibrillation (HCC) - rate controlled with digoxin/atenolol - cont aspirin for clot prevention  8. Chronic pain of right knee - stable with tramadol prn  9. Thrombocytopenia (HCC) - platelets normal - cbc/diff- scheduled 11/21  10. Hypokalemia - due to HCTZ- may consider reducing dose and  adding losartan - cmp- scheduled 11/21  11. Hyponatremia - Na+ 133 08/16/2021 - asymptomatic - may consider reducing HCTZ  and adding losartan in future - cmp- scheduled 11/21  12. Slow transit constipation - LBM 11/13 - cont miralax QOD  13. Anxiety - no recent panic attacks - cont lorazepam qhs    Family/ staff Communication: plan discussed with patient and nurse  Labs/tests ordered: cbc/diff, cmp scheduled 11/21

## 2021-09-21 DIAGNOSIS — Z9181 History of falling: Secondary | ICD-10-CM | POA: Diagnosis not present

## 2021-09-21 DIAGNOSIS — R278 Other lack of coordination: Secondary | ICD-10-CM | POA: Diagnosis not present

## 2021-09-21 DIAGNOSIS — I6389 Other cerebral infarction: Secondary | ICD-10-CM | POA: Diagnosis not present

## 2021-09-21 DIAGNOSIS — M84350S Stress fracture, pelvis, sequela: Secondary | ICD-10-CM | POA: Diagnosis not present

## 2021-09-21 DIAGNOSIS — M6389 Disorders of muscle in diseases classified elsewhere, multiple sites: Secondary | ICD-10-CM | POA: Diagnosis not present

## 2021-09-23 DIAGNOSIS — Z9181 History of falling: Secondary | ICD-10-CM | POA: Diagnosis not present

## 2021-09-23 DIAGNOSIS — I6389 Other cerebral infarction: Secondary | ICD-10-CM | POA: Diagnosis not present

## 2021-09-23 DIAGNOSIS — R278 Other lack of coordination: Secondary | ICD-10-CM | POA: Diagnosis not present

## 2021-09-23 DIAGNOSIS — M6389 Disorders of muscle in diseases classified elsewhere, multiple sites: Secondary | ICD-10-CM | POA: Diagnosis not present

## 2021-09-23 DIAGNOSIS — M84350S Stress fracture, pelvis, sequela: Secondary | ICD-10-CM | POA: Diagnosis not present

## 2021-09-26 DIAGNOSIS — I69354 Hemiplegia and hemiparesis following cerebral infarction affecting left non-dominant side: Secondary | ICD-10-CM | POA: Diagnosis not present

## 2021-09-26 LAB — COMPREHENSIVE METABOLIC PANEL
Albumin: 3.6 (ref 3.5–5.0)
Calcium: 9.4 (ref 8.7–10.7)
GFR calc Af Amer: 57.53
GFR calc non Af Amer: 49.64
Globulin: 4.4

## 2021-09-26 LAB — LIPID PANEL
Cholesterol: 180 (ref 0–200)
HDL: 44 (ref 35–70)
LDL Cholesterol: 122
LDl/HDL Ratio: 4.1
Triglycerides: 70 (ref 40–160)

## 2021-09-26 LAB — BASIC METABOLIC PANEL
BUN: 25 — AB (ref 4–21)
CO2: 29 — AB (ref 13–22)
Chloride: 100 (ref 99–108)
Creatinine: 1 (ref 0.5–1.1)
Glucose: 93
Potassium: 3.8 (ref 3.4–5.3)
Sodium: 141 (ref 137–147)

## 2021-09-26 LAB — CBC AND DIFFERENTIAL
HCT: 36 (ref 36–46)
Hemoglobin: 11.5 — AB (ref 12.0–16.0)
Platelets: 125 — AB (ref 150–399)
WBC: 6.7

## 2021-09-26 LAB — HEPATIC FUNCTION PANEL
ALT: 8 (ref 7–35)
AST: 15 (ref 13–35)
Alkaline Phosphatase: 90 (ref 25–125)
Bilirubin, Total: 0.4

## 2021-09-26 LAB — CBC: RBC: 4.04 (ref 3.87–5.11)

## 2021-09-27 ENCOUNTER — Telehealth: Payer: Self-pay

## 2021-09-27 ENCOUNTER — Other Ambulatory Visit: Payer: Self-pay

## 2021-09-27 ENCOUNTER — Encounter: Payer: Self-pay | Admitting: Nurse Practitioner

## 2021-09-27 ENCOUNTER — Ambulatory Visit (INDEPENDENT_AMBULATORY_CARE_PROVIDER_SITE_OTHER): Payer: Medicare Other | Admitting: Nurse Practitioner

## 2021-09-27 DIAGNOSIS — R278 Other lack of coordination: Secondary | ICD-10-CM | POA: Diagnosis not present

## 2021-09-27 DIAGNOSIS — M84350S Stress fracture, pelvis, sequela: Secondary | ICD-10-CM | POA: Diagnosis not present

## 2021-09-27 DIAGNOSIS — M6389 Disorders of muscle in diseases classified elsewhere, multiple sites: Secondary | ICD-10-CM | POA: Diagnosis not present

## 2021-09-27 DIAGNOSIS — Z0000 Encounter for general adult medical examination without abnormal findings: Secondary | ICD-10-CM

## 2021-09-27 DIAGNOSIS — I6389 Other cerebral infarction: Secondary | ICD-10-CM | POA: Diagnosis not present

## 2021-09-27 DIAGNOSIS — Z9181 History of falling: Secondary | ICD-10-CM | POA: Diagnosis not present

## 2021-09-27 NOTE — Telephone Encounter (Signed)
[name], Susan Conway are scheduled for a virtual visit with your provider today.    Just as we do with appointments in the office, we must obtain your consent to participate.  Your consent will be active for this visit and any virtual visit you may have with one of our providers in the next 365 days.    If you have a MyChart account, I can also send a copy of this consent to you electronically.  All virtual visits are billed to your insurance company just like a traditional visit in the office.  As this is a virtual visit, video technology does not allow for your provider to perform a traditional examination.  This may limit your provider's ability to fully assess your condition.  If your provider identifies any concerns that need to be evaluated in person or the need to arrange testing such as labs, EKG, etc, we will make arrangements to do so.    Although advances in technology are sophisticated, we cannot ensure that it will always work on either your end or our end.  If the connection with a video visit is poor, we may have to switch to a telephone visit.  With either a video or telephone visit, we are not always able to ensure that we have a secure connection.   I need to obtain your verbal consent now.   Are you willing to proceed with your visit today?   KENZLEY KE has provided verbal consent on 09/27/2021 for a virtual visit (video or telephone).   Carroll Kinds, [facility] 09/27/2021  10:38 AM

## 2021-09-27 NOTE — Progress Notes (Signed)
Subjective:   Susan Conway is a 87 y.o. female who presents for Medicare Annual (Subsequent) preventive examination.  Review of Systems     Cardiac Risk Factors include: advanced age (>97men, >50 women);hypertension;dyslipidemia     Objective:    There were no vitals filed for this visit. There is no height or weight on file to calculate BMI.  Advanced Directives 09/27/2021 09/27/2021 09/20/2021 08/15/2021 07/28/2021 06/21/2021 05/30/2021  Does Patient Have a Medical Advance Directive? Yes Yes Yes Yes Yes Yes Yes  Type of Advance Directive Living will;Out of facility DNR (pink MOST or yellow form) Living will;Out of facility DNR (pink MOST or yellow form) [place];Living will;Out of facility DNR (pink MOST or yellow form) [place];Living will;Out of facility DNR (pink MOST or yellow form) [place];Living will;Out of facility DNR (pink MOST or yellow form) [place];Living will;Out of facility DNR (pink MOST or yellow form) [place];Living will;Out of facility DNR (pink MOST or yellow form)  Does patient want to make changes to medical advance directive? No - Patient declined No - Patient declined No - Patient declined No - Patient declined No - Patient declined No - Patient declined No - Patient declined  Copy of [place] in Chart? - - Yes - validated most recent copy scanned in chart (See row information) Yes - validated most recent copy scanned in chart (See row information) Yes - validated most recent copy scanned in chart (See row information) Yes - validated most recent copy scanned in chart (See row information) Yes - validated most recent copy scanned in chart (See row information)  Pre-existing out of facility DNR order (yellow form or pink MOST form) Yellow form placed in chart (order not valid for inpatient use);Pink MOST form placed in chart (order not valid for  inpatient use) Yellow form placed in chart (order not valid for inpatient use);Pink MOST form placed in chart (order not valid for inpatient use) Yellow form placed in chart (order not valid for inpatient use);Pink MOST form placed in chart (order not valid for inpatient use) Yellow form placed in chart (order not valid for inpatient use);Pink MOST form placed in chart (order not valid for inpatient use) Yellow form placed in chart (order not valid for inpatient use);Pink MOST form placed in chart (order not valid for inpatient use) Yellow form placed in chart (order not valid for inpatient use);Pink MOST form placed in chart (order not valid for inpatient use) -    Current Medications (verified) Outpatient Encounter Medications as of 09/27/2021  Medication Sig   acetaminophen (TYLENOL) 500 MG tablet Take 1,000 mg by mouth every 6 (six) hours as needed for mild pain (pain.).    amLODipine (NORVASC) 5 MG tablet Take 5 mg by mouth daily.   aspirin EC 81 MG EC tablet Take 1 tablet (81 mg total) by mouth daily. Swallow whole.   atenolol (TENORMIN) 50 MG tablet Take 50 mg by mouth 2 (two) times daily. HOLD FOR SPB <90 or HR< 60   calcium-vitamin D (OSCAL WITH D) 500-200 MG-UNIT tablet Take 1 tablet by mouth daily.   dextromethorphan-guaiFENesin (MUCINEX DM) 30-600 MG 12hr tablet Take 1 tablet by mouth 2 (two) times daily as needed for cough.   diclofenac Sodium (VOLTAREN) 1 % GEL Apply 4 g topically 4 (four) times daily as needed. Left knee, right knee, left ankle   digoxin (LANOXIN) 0.125 MG tablet Take 0.125  mg by mouth every other day.   hydrALAZINE (APRESOLINE) 10 MG tablet Take 10 mg by mouth 3 (three) times daily as needed. TID PRN for SBP >170   hydrochlorothiazide (HYDRODIURIL) 50 MG tablet Take 50 mg by mouth daily.   loratadine (CLARITIN) 10 MG tablet Take 10 mg by mouth at bedtime.   LORazepam (ATIVAN) 1 MG tablet Take 1 tablet (1 mg total) by mouth at bedtime.   ondansetron (ZOFRAN) 4 MG  tablet Take 1 tablet (4 mg total) by mouth every 6 (six) hours as needed for nausea.   polyethylene glycol (MIRALAX / GLYCOLAX) 17 g packet Take 17 g by mouth every other day.   potassium chloride SA (KLOR-CON) 20 MEQ tablet Take 20 mEq by mouth daily.   traMADol (ULTRAM) 50 MG tablet Take 1 tablet (50 mg total) by mouth every 6 (six) hours as needed for moderate pain or severe pain.   No facility-administered encounter medications on file as of 09/27/2021.    Allergies (verified) Iodinated diagnostic agents, Iodine, Other, Bactrim, Relafen [nabumetone], and Sulfanilamide   History: Past Medical History:  Diagnosis Date   Actinic keratosis 05/25/2014   Anxiety    Atrial fibrillation ([place]) 09/21/2010   Basal cell carcinoma 05/25/2014   Multiple removed by Dr. Danella Sensing in March 2015: right neck, left neck, scalp    Bell's palsy 07/18/1979   Cervicalgia 01/31/2012   Closed fracture of lumbar vertebra without mention of spinal cord injury 07/17/2005   Conjunctiva disorder 12/26/2010   Coronary atherosclerosis of native coronary artery 07/17/1997   Cramp of limb 08/16/2009   Degeneration of lumbar or lumbosacral intervertebral disc 01/31/2012   Disturbance of skin sensation 08/2009   Dizziness and giddiness 11/08/2011   vertigo   External hemorrhoids without mention of complication 9/67/8938   External hemorrhoids without mention of complication 08/22/5101   Ganglion of tendon sheath 08/16/2009   Leg cramp 10/27/2013   Most frequently the right leg.    Long term (current) use of anticoagulants 09/2010   Lumbago 07/2009   Meralgia paresthetica 07/2007   MI, old    Myalgia and myositis, unspecified 01/17/2012   Osteoporosis    Other abnormal blood chemistry 04/08/2012   Other abnormal blood chemistry 2013   hyperglycemia   Other disorder of muscle, ligament, and fascia 04/08/2012   Other specified cardiac dysrhythmias(427.89) 06/27/2010   Pacemaker 05/06/2020   Pain in joint, ankle and foot  10/27/2013   Bilateral since 1998    Pain in joint, shoulder region 08/12/2012   Pain in joint, upper arm 12/26/2010   Pain in limb 01/11/2011   Pain in thoracic spine 01/31/2012   Pathologic fracture of vertebrae 01/22/2012   PVC's (premature ventricular contractions)    Rash and other nonspecific skin eruption 08/02/2011   Senile osteoporosis 07/17/1993   Stroke ([place])    Unspecified essential hypertension 07/17/1997   Varicose veins of lower extremities 08/16/2009   Varicose veins of lower extremities 08/16/2009   Past Surgical History:  Procedure Laterality Date   COLONOSCOPY WITH PROPOFOL N/A 10/19/2015   Procedure: COLONOSCOPY WITH PROPOFOL;  Surgeon: Gatha Mayer, MD;  Location: [facility];  Service: Endoscopy;  Laterality: N/A;   CORONARY ARTERY BYPASS GRAFT  1998   x2; LIMA to LAD; SVG to diagonal off bypass   HEMORRHOID SURGERY  08/26/2012   Dr. Brantley Stage   LOOP RECORDER INSERTION N/A 05/13/2018   Procedure: LOOP RECORDER INSERTION;  Surgeon: [name], MD;  [facility] INVASIVE CV  LAB;  Service: Cardiovascular;  Laterality: N/A;   LOOP RECORDER REMOVAL N/A 05/26/2019   Procedure: LOOP RECORDER REMOVAL;  Surgeon: [name], MD;  Location: [place] CV LAB;  Service: Cardiovascular;  Laterality: N/A;   MASS EXCISION Left 11/23/2015   Procedure: LEFT WRIST EXCISION CYST;  Surgeon: Leanora Cover, MD;  Location: [facility];  Service: Orthopedics;  Laterality: Left;   PACEMAKER IMPLANT N/A 05/26/2019   Procedure: PACEMAKER IMPLANT;  Surgeon: [name], MD;  Location: [facility] CV LAB;  Service: Cardiovascular;  Laterality: N/A;   SKIN BIOPSY  01/29/14   (R) neck; (R) scalp, 2 (L) neck; shave biopsy Superficial basal cell carcinoma Dr. Danella Sensing   TONSILLECTOMY  [phone]   Family History  Problem Relation Age of Onset   Heart attack Father 80   Heart disease Father    Hypertension Sister    Heart disease Sister    Heart disease Brother    Social  History   Socioeconomic History   Marital status: Widowed    Spouse name: Not on file   Number of children: Not on file   Years of education: Not on file   Highest education level: Not on file  Occupational History   Occupation: Retired Pharmacist, hospital  Tobacco Use   Smoking status: Never   Smokeless tobacco: Never  Vaping Use   Vaping Use: Never used  Substance and Sexual Activity   Alcohol use: Yes    Comment: occasional wine   Drug use: No   Sexual activity: Never  Other Topics Concern   Not on file  Social History Narrative   Living at [facility] since 2010   Widowed 09/03/2016   Never smoked   Alcohol occasional wine   Exercise, house work    DNR , POA, Living Will   Social Determinants of Health   Financial Resource Strain: Not on file  Food Insecurity: Not on file  Transportation Needs: Not on file  Physical Activity: Not on file  Stress: Not on file  Social Connections: Not on file    Tobacco Counseling Counseling given: Not Answered   Clinical Intake:  Pre-visit preparation completed: Yes  Pain : No/denies pain     BMI - recorded: 20 Nutritional Status: BMI of 19-24  Normal Diabetes: No  How often do you need to have someone help you when you read instructions, pamphlets, or other written materials from your doctor or pharmacy?: 1 - Never  Diabetic?no         Activities of Daily Living In your present state of health, do you have any difficulty performing the following activities: 09/27/2021  Hearing? N  Vision? N  Difficulty concentrating or making decisions? N  Walking or climbing stairs? Y  Comment uses electric wheelchair  Dressing or bathing? N  Doing errands, shopping? Y  Comment does not Physiological scientist and eating ? N  Using the Toilet? N  In the past six months, have you accidently leaked urine? Y  Do you have problems with loss of bowel control? N  Managing your Medications? N  Comment facility does medication  Managing  your Finances? Y  Comment family helps  Housekeeping or managing your Housekeeping? N  Some recent data might be hidden    Patient Care Team: [name], MD as PCP - General (Internal Medicine) [name], MD as PCP - Electrophysiology (Cardiology) [name], MD as Consulting Physician (Dermatology) [name], MD as Consulting  Physician (Cardiology) [facility] [name], MD as Consulting Physician (Dermatology)  Indicate any recent Medical Services you may have received from other than [facility] providers in the past year (date may be approximate).     Assessment:   This is a routine wellness examination for Nilaya.  Hearing/Vision screen Hearing Screening - Comments:: Patient has no hearing problems. Vision Screening - Comments:: Patient has no vision problems. Patient has had eye exam within past year. Patient sees Dr. Ellie Lunch  Dietary issues and exercise activities discussed: Current Exercise Habits: Home exercise routine, Type of exercise: walking, Time (Minutes): 30, Frequency (Times/Week): 3, Weekly Exercise (Minutes/Week): 90   Goals Addressed   None    Depression Screen PHQ 2/9 Scores 09/27/2021 09/23/2020 08/26/2020 07/28/2020 02/25/2020 01/28/2020 09/02/2019  PHQ - 2 Score 0 0 0 0 0 0 0    Fall Risk Fall Risk  09/27/2021 09/23/2020 08/26/2020 07/28/2020 02/25/2020  Falls in the past year? 0 1 1 0 0  Number falls in past yr: 0 0 0 1 0  Comment - - - - -  Injury with Fall? 0 1 1 0 0  Comment - Patient fractured pelvis and had a stroke - - -  Risk for fall due to : No Fall Risks - History of fall(s);Impaired balance/gait;Impaired mobility;Orthopedic patient - -  Follow up Falls evaluation completed - Falls evaluation completed;Education provided;Falls prevention discussed;Follow up appointment - -    FALL RISK PREVENTION PERTAINING TO THE HOME:  Any stairs in or around the home? No  If so, are there any without handrails? No   Home free of loose throw rugs in walkways, pet beds, electrical cords, etc? Yes  Adequate lighting in your home to reduce risk of falls? Yes   ASSISTIVE DEVICES UTILIZED TO PREVENT FALLS:  Life alert? Yes  Use of a cane, walker or w/c? Yes  Grab bars in the bathroom? Yes  Shower chair or bench in shower? Yes  Elevated toilet seat or a handicapped toilet? Yes   TIMED UP AND GO:  Was the test performed? No .    Cognitive Function: MMSE - Mini Mental State Exam 09/10/2018 08/14/2017 09/22/2015  Orientation to time 5 5 5   Orientation to Place 5 5 5   Registration 3 3 3   Attention/ Calculation 5 5 5   Recall 3 2 3   Language- name 2 objects 2 2 2   Language- repeat 1 1 1   Language- follow 3 step command 3 3 3   Language- read & follow direction 1 1 1   Write a sentence 1 1 1   Copy design 1 1 1   Total score 30 29 30      6CIT Screen 09/27/2021 09/23/2020 09/02/2019  What Year? 0 points 0 points 0 points  What month? 0 points 0 points 0 points  What time? 0 points 0 points 0 points  Count back from 20 2 points 0 points 0 points  Months in reverse 0 points 0 points 0 points  Repeat phrase 0 points 0 points 0 points  Total Score 2 0 0    Immunizations Immunization History  Administered Date(s) Administered   Influenza Whole 08/06/2012   Influenza, High Dose Seasonal PF 08/15/2019, 08/27/2020   Influenza,inj,Quad PF,6+ Mos 08/30/2018   Influenza-Unspecified 08/06/2013, 08/24/2014, 08/26/2015, 08/31/2016, 08/27/2017, 08/27/2020   Moderna Sars-Covid-2 Vaccination 11/16/2019, 12/16/2019, 09/16/2020   Pneumococcal Conjugate-13 09/22/2015   Pneumococcal Polysaccharide-23 08/29/2006   Td 10/07/2007   Zoster Recombinat (Shingrix) 02/12/2018, 05/13/2018   Zoster, Live 03/12/2008  TDAP status: Due, Education has been provided regarding the importance of this vaccine. Advised may receive this vaccine at local pharmacy or Health Dept. Aware to provide a copy of the vaccination record  if obtained from local pharmacy or Health Dept. Verbalized acceptance and understanding.  Flu Vaccine status: Due, Education has been provided regarding the importance of this vaccine. Advised may receive this vaccine at local pharmacy or Health Dept. Aware to provide a copy of the vaccination record if obtained from local pharmacy or Health Dept. Verbalized acceptance and understanding.  Pneumococcal vaccine status: Up to date  Covid-19 vaccine status: Information provided on how to obtain vaccines.   Qualifies for Shingles Vaccine? Yes   Zostavax completed No   Shingrix Completed?: Yes  Screening Tests Health Maintenance  Topic Date Due   TETANUS/TDAP  10/06/2017   COVID-19 Vaccine (4 - Booster for Moderna series) 10/06/2021 (Originally 11/11/2020)   INFLUENZA VACCINE  02/03/2022 (Originally 06/06/2021)   Pneumonia Vaccine 77+ Years old  Completed   DEXA SCAN  Completed   Zoster Vaccines- Shingrix  Completed   HPV VACCINES  Aged Out    Health Maintenance  Health Maintenance Due  Topic Date Due   TETANUS/TDAP  10/06/2017    Colorectal cancer screening: No longer required.   Mammogram status: No longer required due to age.  Bone Density status: Completed 2017. Results reflect: Bone density results: OSTEOPENIA. Repeat every 2 years.  Lung Cancer Screening: (Low Dose CT Chest recommended if Age 59-80 years, 30 pack-year currently smoking OR have quit w/in 15years.) does not qualify.   Lung Cancer Screening Referral: na  Additional Screening:  Hepatitis C Screening: does not qualify;   Vision Screening: Recommended annual ophthalmology exams for early detection of glaucoma and other disorders of the eye. Is the patient up to date with their annual eye exam?  Yes  Who is the provider or what is the name of the office in which the patient attends annual eye exams? [facility] If pt is not established with a provider, would they like to be referred to a provider to establish care? No  .   Dental Screening: Recommended annual dental exams for proper oral hygiene  Community Resource Referral / Chronic Care Management: CRR required this visit?  No   CCM required this visit?  No      Plan:     I have personally reviewed and noted the following in the patient's chart:   Medical and social history Use of alcohol, tobacco or illicit drugs  Current medications and supplements including opioid prescriptions.  Functional ability and status Nutritional status Physical activity Advanced directives List of other physicians Hospitalizations, surgeries, and ER visits in previous 12 months Vitals Screenings to include cognitive, depression, and falls Referrals and appointments  In addition, I have reviewed and discussed with patient certain preventive protocols, quality metrics, and best practice recommendations. A written personalized care plan for preventive services as well as general preventive health recommendations were provided to patient.     Lauree Chandler, NP   09/27/2021    Virtual Visit via Telephone Note  I connected withNAME@ on 09/27/21 at 10:00 AM EST by telephone and verified that I am speaking with the correct person using two identifiers.  Location: Patient: home Provider: [facility]    I discussed the limitations, risks, security and privacy concerns of performing an evaluation and management service by telephone and the availability of in person appointments. I also discussed with the patient that  there may be a patient responsible charge related to this service. The patient expressed understanding and agreed to proceed.   I discussed the assessment and treatment plan with the patient. The patient was provided an opportunity to ask questions and all were answered. The patient agreed with the plan and demonstrated an understanding of the instructions.   The patient was advised to call back or seek an in-person evaluation if the symptoms  worsen or if the condition fails to improve as anticipated.  I provided 15 minutes of non-face-to-face time during this encounter.  Carlos American. Harle Battiest Avs printed and mailed

## 2021-09-27 NOTE — Progress Notes (Signed)
This service is provided via telemedicine  No vital signs collected/recorded due to the encounter was a telemedicine visit.   Location of patient (ex: home, work):  Home  Patient consents to a telephone visit:  Yes, see encounter dated 09/27/2021  Location of the provider (ex: office, home):  [place]  Name of any referring provider:  Veleta Miners, MD  Names of all persons participating in the telemedicine service and their role in the encounter:  Sherrie Mustache, Nurse Practitioner, Carroll Kinds, CMA, and patient.   Time spent on call:  12 minutes with medical assistant

## 2021-09-27 NOTE — Patient Instructions (Signed)
[name] , Thank you for taking time to come for your Medicare Wellness Visit. I appreciate your ongoing commitment to your health goals. Please review the following plan we discussed and let me know if I can assist you in the future.   Screening recommendations/referrals: Colonoscopy aged out Mammogram aged out Bone Density aged out Recommended yearly ophthalmology/optometry visit for glaucoma screening and checkup Recommended yearly dental visit for hygiene and checkup  Vaccinations: Influenza vaccine recommended at this time.  Pneumococcal vaccine up to date Tdap vaccine RECOMMENDED at this time.  Shingles vaccine up to date  COVID booster- recommended at this time.     Advanced directives: on file.    Preventive Care 69 Years and Older, Female Preventive care refers to lifestyle choices and visits with your health care provider that can promote health and wellness. What does preventive care include? A yearly physical exam. This is also called an annual well check. Dental exams once or twice a year. Routine eye exams. Ask your health care provider how often you should have your eyes checked. Personal lifestyle choices, including: Daily care of your teeth and gums. Regular physical activity. Eating a healthy diet. Avoiding tobacco and drug use. Limiting alcohol use. Practicing safe sex. Taking low-dose aspirin every day. Taking vitamin and mineral supplements as recommended by your health care provider. What happens during an annual well check? The services and screenings done by your health care provider during your annual well check will depend on your age, overall health, lifestyle risk factors, and family history of disease. Counseling  Your health care provider may ask you questions about your: Alcohol use. Tobacco use. Drug use. Emotional well-being. Home and relationship well-being. Sexual activity. Eating habits. History of falls. Memory and ability to  understand (cognition). Work and work Statistician. Reproductive health. Screening  You may have the following tests or measurements: Height, weight, and BMI. Blood pressure. Lipid and cholesterol levels. These may be checked every 5 years, or more frequently if you are over 30 years old. Skin check. Lung cancer screening. You may have this screening every year starting at age 15 if you have a 30-pack-year history of smoking and currently smoke or have quit within the past 15 years. Fecal occult blood test (FOBT) of the stool. You may have this test every year starting at age 81. Flexible sigmoidoscopy or colonoscopy. You may have a sigmoidoscopy every 5 years or a colonoscopy every 10 years starting at age 98. Hepatitis C blood test. Hepatitis B blood test. Sexually transmitted disease (STD) testing. Diabetes screening. This is done by checking your blood sugar (glucose) after you have not eaten for a while (fasting). You may have this done every 1-3 years. Bone density scan. This is done to screen for osteoporosis. You may have this done starting at age 40. Mammogram. This may be done every 1-2 years. Talk to your health care provider about how often you should have regular mammograms. Talk with your health care provider about your test results, treatment options, and if necessary, the need for more tests. Vaccines  Your health care provider may recommend certain vaccines, such as: Influenza vaccine. This is recommended every year. Tetanus, diphtheria, and acellular pertussis (Tdap, Td) vaccine. You may need a Td booster every 10 years. Zoster vaccine. You may need this after age 41. Pneumococcal 13-valent conjugate (PCV13) vaccine. One dose is recommended after age 32. Pneumococcal polysaccharide (PPSV23) vaccine. One dose is recommended after age 89. Talk to your health care provider  about which screenings and vaccines you need and how often you need them. This information is not  intended to replace advice given to you by your health care provider. Make sure you discuss any questions you have with your health care provider. Document Released: 11/19/2015 Document Revised: 07/12/2016 Document Reviewed: 08/24/2015 Elsevier Interactive Patient Education  2017 [employer] Prevention in the Home Falls can cause injuries. They can happen to people of all ages. There are many things you can do to make your home safe and to help prevent falls. What can I do on the outside of my home? Regularly fix the edges of walkways and driveways and fix any cracks. Remove anything that might make you trip as you walk through a door, such as a raised step or threshold. Trim any bushes or trees on the path to your home. Use bright outdoor lighting. Clear any walking paths of anything that might make someone trip, such as rocks or tools. Regularly check to see if handrails are loose or broken. Make sure that both sides of any steps have handrails. Any raised decks and porches should have guardrails on the edges. Have any leaves, snow, or ice cleared regularly. Use sand or salt on walking paths during winter. Clean up any spills in your garage right away. This includes oil or grease spills. What can I do in the bathroom? Use night lights. Install grab bars by the toilet and in the tub and shower. Do not use towel bars as grab bars. Use non-skid mats or decals in the tub or shower. If you need to sit down in the shower, use a plastic, non-slip stool. Keep the floor dry. Clean up any water that spills on the floor as soon as it happens. Remove soap buildup in the tub or shower regularly. Attach bath mats securely with double-sided non-slip rug tape. Do not have throw rugs and other things on the floor that can make you trip. What can I do in the bedroom? Use night lights. Make sure that you have a light by your bed that is easy to reach. Do not use any sheets or blankets that are  too big for your bed. They should not hang down onto the floor. Have a firm chair that has side arms. You can use this for support while you get dressed. Do not have throw rugs and other things on the floor that can make you trip. What can I do in the kitchen? Clean up any spills right away. Avoid walking on wet floors. Keep items that you use a lot in easy-to-reach places. If you need to reach something above you, use a strong step stool that has a grab bar. Keep electrical cords out of the way. Do not use floor polish or wax that makes floors slippery. If you must use wax, use non-skid floor wax. Do not have throw rugs and other things on the floor that can make you trip. What can I do with my stairs? Do not leave any items on the stairs. Make sure that there are handrails on both sides of the stairs and use them. Fix handrails that are broken or loose. Make sure that handrails are as long as the stairways. Check any carpeting to make sure that it is firmly attached to the stairs. Fix any carpet that is loose or worn. Avoid having throw rugs at the top or bottom of the stairs. If you do have throw rugs, attach them to the floor  with carpet tape. Make sure that you have a light switch at the top of the stairs and the bottom of the stairs. If you do not have them, ask someone to add them for you. What else can I do to help prevent falls? Wear shoes that: Do not have high heels. Have rubber bottoms. Are comfortable and fit you well. Are closed at the toe. Do not wear sandals. If you use a stepladder: Make sure that it is fully opened. Do not climb a closed stepladder. Make sure that both sides of the stepladder are locked into place. Ask someone to hold it for you, if possible. Clearly mark and make sure that you can see: Any grab bars or handrails. First and last steps. Where the edge of each step is. Use tools that help you move around (mobility aids) if they are needed. These  include: Canes. Walkers. Scooters. Crutches. Turn on the lights when you go into a dark area. Replace any light bulbs as soon as they burn out. Set up your furniture so you have a clear path. Avoid moving your furniture around. If any of your floors are uneven, fix them. If there are any pets around you, be aware of where they are. Review your medicines with your doctor. Some medicines can make you feel dizzy. This can increase your chance of falling. Ask your doctor what other things that you can do to help prevent falls. This information is not intended to replace advice given to you by your health care provider. Make sure you discuss any questions you have with your health care provider. Document Released: 08/19/2009 Document Revised: 03/30/2016 Document Reviewed: 11/27/2014 Elsevier Interactive Patient Education  2017 [employer].

## 2021-10-04 ENCOUNTER — Encounter: Payer: Self-pay | Admitting: Orthopedic Surgery

## 2021-10-04 ENCOUNTER — Non-Acute Institutional Stay (SKILLED_NURSING_FACILITY): Payer: Medicare Other | Admitting: Orthopedic Surgery

## 2021-10-04 DIAGNOSIS — M545 Low back pain, unspecified: Secondary | ICD-10-CM

## 2021-10-04 DIAGNOSIS — M542 Cervicalgia: Secondary | ICD-10-CM

## 2021-10-04 NOTE — Progress Notes (Signed)
Location:  [facility] Room Number: 118-A Place of Service:  SNF [phone]) Provider:  Windell Moulding, AGNP-C  [name], MD  Patient Care Team: [name], MD as PCP - General (Internal Medicine) [name], MD as PCP - Electrophysiology (Cardiology) [name], MD as Consulting Physician (Dermatology) [name], MD as Consulting Physician (Cardiology) [facility] [name], MD as Consulting Physician (Dermatology)  Extended Emergency Contact Information Primary Emergency Contact: [name] of [place] Phone: [phone] Mobile Phone: [phone] Relation: Daughter Secondary Emergency Contact: [name],[name]  [name] of [place] Phone: [phone] Relation: Son  Code Status:  DNR Goals of care: Advanced Directive information Advanced Directives 09/27/2021  Does Patient Have a Medical Advance Directive? Yes  Type of Advance Directive Living will;Out of facility DNR (pink MOST or yellow form)  Does patient want to make changes to medical advance directive? No - Patient declined  Copy of [place] in Chart? -  Pre-existing out of facility DNR order (yellow form or pink MOST form) Yellow form placed in chart (order not valid for inpatient use);Pink MOST form placed in chart (order not valid for inpatient use)     Chief Complaint  Patient presents with   Acute Visit    Neck pain    HPI:  Susan Conway is a 87 y.o. female seen today for acute visit due to neck and back pain.   Nursing reports she has had difficulty getting out of bed the past 2 days due to increased neck and back pain. Pain began when she woke up 2 days ago. Unsure if related to mattress or sleeping position. Unable to move neck without pain. Neck pain rated 4/10, described as tight and sharp. Back pain located in mid to lower area. Reports history of intermittent back pain after stroke. Back  pain rated 4/10, described as tight and sharp. She is taking tramadol with some success. Treatment option discussed, she would like to try voltaren gel and muscle relaxer.   No recent falls or injuries. Ambulates mainly with PWC. Walks with daughter    Past Medical History:  Diagnosis Date   Actinic keratosis 05/25/2014   Anxiety    Atrial fibrillation ([place]) 09/21/2010   Basal cell carcinoma 05/25/2014   Multiple removed by Dr. Danella Sensing in March 2015: right neck, left neck, scalp    Bell's palsy 07/18/1979   Cervicalgia 01/31/2012   Closed fracture of lumbar vertebra without mention of spinal cord injury 07/17/2005   Conjunctiva disorder 12/26/2010   Coronary atherosclerosis of native coronary artery 07/17/1997   Cramp of limb 08/16/2009   Degeneration of lumbar or lumbosacral intervertebral disc 01/31/2012   Disturbance of skin sensation 08/2009   Dizziness and giddiness 11/08/2011   vertigo   External hemorrhoids without mention of complication 6/96/7893   External hemorrhoids without mention of complication 81/11/7508   Ganglion of tendon sheath 08/16/2009   Leg cramp 10/27/2013   Most frequently the right leg.    Long term (current) use of anticoagulants 09/2010   Lumbago 07/2009   Meralgia paresthetica 07/2007   MI, old    Myalgia and myositis, unspecified 01/17/2012   Osteoporosis    Other abnormal blood chemistry 04/08/2012   Other abnormal blood chemistry 2013   hyperglycemia   Other disorder of muscle, ligament, and fascia 04/08/2012   Other specified cardiac dysrhythmias(427.89) 06/27/2010   Pacemaker 05/06/2020   Pain in joint, ankle and foot 10/27/2013  Bilateral since 1998    Pain in joint, shoulder region 08/12/2012   Pain in joint, upper arm 12/26/2010   Pain in limb 01/11/2011   Pain in thoracic spine 01/31/2012   Pathologic fracture of vertebrae 01/22/2012   PVC's (premature ventricular contractions)    Rash and other nonspecific skin eruption 08/02/2011   Senile  osteoporosis 07/17/1993   Stroke ([place])    Unspecified essential hypertension 07/17/1997   Varicose veins of lower extremities 08/16/2009   Varicose veins of lower extremities 08/16/2009   Past Surgical History:  Procedure Laterality Date   COLONOSCOPY WITH PROPOFOL N/A 10/19/2015   Procedure: COLONOSCOPY WITH PROPOFOL;  Surgeon: Gatha Mayer, MD;  Location: [facility];  Service: Endoscopy;  Laterality: N/A;   CORONARY ARTERY BYPASS GRAFT  1998   x2; LIMA to LAD; SVG to diagonal off bypass   HEMORRHOID SURGERY  08/26/2012   Dr. Brantley Stage   LOOP RECORDER INSERTION N/A 05/13/2018   Procedure: LOOP RECORDER INSERTION;  Surgeon: [name], MD;  Location: [facility];  Service: Cardiovascular;  Laterality: N/A;   LOOP RECORDER REMOVAL N/A 05/26/2019   Procedure: LOOP RECORDER REMOVAL;  Surgeon: [name], MD;  Location: [facility];  Service: Cardiovascular;  Laterality: N/A;   MASS EXCISION Left 11/23/2015   Procedure: LEFT WRIST EXCISION CYST;  Surgeon: Leanora Cover, MD;  Location: [facility];  Service: Orthopedics;  Laterality: Left;   PACEMAKER IMPLANT N/A 05/26/2019   Procedure: PACEMAKER IMPLANT;  Surgeon: [name], MD;  Location: [facility];  Service: Cardiovascular;  Laterality: N/A;   SKIN BIOPSY  01/29/14   (R) neck; (R) scalp, 2 (L) neck; shave biopsy Superficial basal cell carcinoma Dr. Danella Sensing   TONSILLECTOMY  [phone]    Allergies  Allergen Reactions   Iodinated Diagnostic Agents Nausea Only and Other (See Comments)    Severe nausea and also passed out   Iodine    Other     Seasonal Allergies.   Bactrim Rash   Relafen [Nabumetone] Rash   Sulfanilamide Rash    Outpatient Encounter Medications as of 10/04/2021  Medication Sig   acetaminophen (TYLENOL) 500 MG tablet Take 1,000 mg by mouth every 6 (six) hours as needed for mild pain (pain.).    amLODipine (NORVASC) 5 MG tablet Take 5 mg by mouth daily.   aspirin EC 81  MG EC tablet Take 1 tablet (81 mg total) by mouth daily. Swallow whole.   atenolol (TENORMIN) 50 MG tablet Take 50 mg by mouth 2 (two) times daily. HOLD FOR SPB <90 or HR< 60   calcium-vitamin D (OSCAL WITH D) 500-200 MG-UNIT tablet Take 1 tablet by mouth daily.   dextromethorphan-guaiFENesin (MUCINEX DM) 30-600 MG 12hr tablet Take 1 tablet by mouth 2 (two) times daily as needed for cough.   diclofenac Sodium (VOLTAREN) 1 % GEL Apply 4 g topically 4 (four) times daily as needed. Left knee, right knee, left ankle   digoxin (LANOXIN) 0.125 MG tablet Take 0.125 mg by mouth every other day.   hydrALAZINE (APRESOLINE) 10 MG tablet Take 10 mg by mouth 3 (three) times daily as needed. TID PRN for SBP >170   hydrochlorothiazide (HYDRODIURIL) 50 MG tablet Take 50 mg by mouth daily.   loratadine (CLARITIN) 10 MG tablet Take 10 mg by mouth at bedtime.   LORazepam (ATIVAN) 1 MG tablet Take 1 tablet (1 mg total) by mouth at bedtime.   ondansetron (ZOFRAN) 4 MG  tablet Take 1 tablet (4 mg total) by mouth every 6 (six) hours as needed for nausea.   polyethylene glycol (MIRALAX / GLYCOLAX) 17 g packet Take 17 g by mouth every other day.   potassium chloride SA (KLOR-CON) 20 MEQ tablet Take 20 mEq by mouth daily.   traMADol (ULTRAM) 50 MG tablet Take 1 tablet (50 mg total) by mouth every 6 (six) hours as needed for moderate pain or severe pain.   No facility-administered encounter medications on file as of 10/04/2021.    Review of Systems  Constitutional:  Positive for activity change. Negative for appetite change, chills, fatigue and fever.  Respiratory:  Negative for cough, shortness of breath and wheezing.   Cardiovascular:  Negative for chest pain and leg swelling.  Musculoskeletal:  Positive for back pain, gait problem and neck pain.  Psychiatric/Behavioral:  Negative for dysphoric mood. The patient is not nervous/anxious.    Immunization History  Administered Date(s) Administered   Influenza Whole  08/06/2012   Influenza, High Dose Seasonal PF 08/15/2019, 08/27/2020   Influenza,inj,Quad PF,6+ Mos 08/30/2018   Influenza-Unspecified 08/06/2013, 08/24/2014, 08/26/2015, 08/31/2016, 08/27/2017, 08/27/2020   Moderna Sars-Covid-2 Vaccination 11/16/2019, 12/16/2019, 09/16/2020   Pneumococcal Conjugate-13 09/22/2015   Pneumococcal Polysaccharide-23 08/29/2006   Td 10/07/2007   Zoster Recombinat (Shingrix) 02/12/2018, 05/13/2018   Zoster, Live 03/12/2008   Pertinent  Health Maintenance Due  Topic Date Due   INFLUENZA VACCINE  02/03/2022 (Originally 06/06/2021)   DEXA SCAN  Completed   Fall Risk 08/23/2020 08/23/2020 08/26/2020 09/23/2020 09/27/2021  Falls in the past year? - - 1 1 0  Number of falls in past year - - - - -  Was there an injury with Fall? - - 1 1 0  Was there an injury with Fall? - - - Patient fractured pelvis and had a stroke -  Fall Risk Category Calculator - - 2 2 0  Fall Risk Category - - Moderate Moderate Low  Patient Fall Risk Level High fall risk High fall risk - Moderate fall risk Low fall risk  Patient at Risk for Falls Due to - - History of fall(s);Impaired balance/gait;Impaired mobility;Orthopedic patient - No Fall Risks  Fall risk Follow up - - Falls evaluation completed;Education provided;Falls prevention discussed;Follow up appointment - Falls evaluation completed   Functional Status Survey:    Vitals:   10/04/21 1629  BP: (!) 153/89  Pulse: 85  Resp: 18  Temp: (!) 97.2 F (36.2 C)  SpO2: 96%  Weight: 128 lb 4.8 oz (58.2 kg)  Height: 5\' 6"  (1.676 m)   Body mass index is 20.71 kg/m. Physical Exam Vitals reviewed.  Constitutional:      General: She is not in acute distress. Eyes:     General:        Right eye: No discharge.        Left eye: No discharge.  Cardiovascular:     Rate and Rhythm: Normal rate. Rhythm irregular.     Pulses: Normal pulses.     Heart sounds: Normal heart sounds. No murmur heard. Pulmonary:     Effort: Pulmonary  effort is normal. No respiratory distress.     Breath sounds: Normal breath sounds. No wheezing.  Musculoskeletal:     Cervical back: No erythema or signs of trauma. Pain with movement and muscular tenderness present. Decreased range of motion.     Thoracic back: No swelling or tenderness. Decreased range of motion.     Lumbar back: No swelling, tenderness or bony tenderness.  Decreased range of motion.  Skin:    General: Skin is warm and dry.     Capillary Refill: Capillary refill takes less than 2 seconds.  Neurological:     General: No focal deficit present.     Mental Status: She is alert. Mental status is at baseline.     Motor: Weakness present.     Gait: Gait abnormal.     Comments: Left sided hemiplegia  Psychiatric:        Mood and Affect: Mood normal.        Behavior: Behavior normal.    Labs reviewed: Recent Labs    08/05/21 0000 08/16/21 0000 09/26/21 0000  NA 136* 133* 141  K 3.6 4.4 3.8  CL 96* 92* 100  CO2 31* 29* 29*  BUN 26* 18 25*  CREATININE 1.1 0.8 1.0  CALCIUM 9.1 8.9 9.4   Recent Labs    08/16/21 0000 09/26/21 0000  AST 30 15  ALT 23 8  ALKPHOS 87 90  ALBUMIN 3.1* 3.6   Recent Labs    05/20/21 0000 08/16/21 0000 09/26/21 0000  WBC 5.7 11.0 6.7  HGB 12.7 10.8* 11.5*  HCT 37 32* 36  PLT 121* 165 125*   Lab Results  Component Value Date   TSH 2.48 03/22/2021   Lab Results  Component Value Date   HGBA1C 5.7 (H) 08/19/2020   Lab Results  Component Value Date   CHOL 180 09/26/2021   HDL 44 09/26/2021   LDLCALC 122 09/26/2021   LDLDIRECT 81.3 08/19/2020   TRIG 70 09/26/2021   CHOLHDL 2 09/12/2010    Significant Diagnostic Results in last 30 days:  No results found.  Assessment/Plan 1. Cervicalgia - limited ROM, left neck tenderness - start robaxin 250 mg po bid prn x 1 month - apply voltaren gel 1%- apply to neck and back tid prn x 1 month - cont heat applications - recommend light movement and stretching   2. Acute  midline low back pain without sciatica - see above    Family/ staff Communication: plan discussed with patient and nurse  Labs/tests ordered:  none

## 2021-10-07 DIAGNOSIS — M84350S Stress fracture, pelvis, sequela: Secondary | ICD-10-CM | POA: Diagnosis not present

## 2021-10-07 DIAGNOSIS — Z9181 History of falling: Secondary | ICD-10-CM | POA: Diagnosis not present

## 2021-10-07 DIAGNOSIS — M6389 Disorders of muscle in diseases classified elsewhere, multiple sites: Secondary | ICD-10-CM | POA: Diagnosis not present

## 2021-10-07 DIAGNOSIS — R278 Other lack of coordination: Secondary | ICD-10-CM | POA: Diagnosis not present

## 2021-10-07 DIAGNOSIS — I6389 Other cerebral infarction: Secondary | ICD-10-CM | POA: Diagnosis not present

## 2021-10-10 ENCOUNTER — Ambulatory Visit (INDEPENDENT_AMBULATORY_CARE_PROVIDER_SITE_OTHER): Payer: Medicare Other | Admitting: Internal Medicine

## 2021-10-10 ENCOUNTER — Other Ambulatory Visit: Payer: Self-pay

## 2021-10-10 ENCOUNTER — Encounter: Payer: Self-pay | Admitting: Internal Medicine

## 2021-10-10 VITALS — BP 144/88 | HR 78 | Ht 66.0 in | Wt 128.0 lb

## 2021-10-10 DIAGNOSIS — M6389 Disorders of muscle in diseases classified elsewhere, multiple sites: Secondary | ICD-10-CM | POA: Diagnosis not present

## 2021-10-10 DIAGNOSIS — I4821 Permanent atrial fibrillation: Secondary | ICD-10-CM

## 2021-10-10 DIAGNOSIS — R278 Other lack of coordination: Secondary | ICD-10-CM | POA: Diagnosis not present

## 2021-10-10 DIAGNOSIS — I639 Cerebral infarction, unspecified: Secondary | ICD-10-CM

## 2021-10-10 DIAGNOSIS — M84350S Stress fracture, pelvis, sequela: Secondary | ICD-10-CM | POA: Diagnosis not present

## 2021-10-10 DIAGNOSIS — I6389 Other cerebral infarction: Secondary | ICD-10-CM | POA: Diagnosis not present

## 2021-10-10 DIAGNOSIS — Z9181 History of falling: Secondary | ICD-10-CM | POA: Diagnosis not present

## 2021-10-10 NOTE — Progress Notes (Signed)
HPI Susan Conway returns today for followup. She is a pleasant 87 yo woman with a h/o now perm atrial fib, long pauses, HTN, s/p stroke. She presents today to discuss the possibility of restarting systemic anti-coagulation. She has become more sedentary, and does not try to walk without assistance. She had bled into a Bakers cyst 2 years ago on Eliquis 2.5 mg twice daily. She has a h/o hemorrhoidal bleeding. She is quite frail.  Allergies  Allergen Reactions   Iodinated Diagnostic Agents Nausea Only and Other (See Comments)    Severe nausea and also passed out   Iodine    Other     Seasonal Allergies.   Bactrim Rash   Relafen [Nabumetone] Rash   Sulfanilamide Rash     Current Outpatient Medications  Medication Sig Dispense Refill   acetaminophen (TYLENOL) 500 MG tablet Take 1,000 mg by mouth every 6 (six) hours as needed for mild pain (pain.).      amLODipine (NORVASC) 5 MG tablet Take 5 mg by mouth daily.     aspirin EC 81 MG EC tablet Take 1 tablet (81 mg total) by mouth daily. Swallow whole. 30 tablet 11   atenolol (TENORMIN) 50 MG tablet Take 50 mg by mouth 2 (two) times daily. HOLD FOR SPB <90 or HR< 60     calcium-vitamin D (OSCAL WITH D) 500-200 MG-UNIT tablet Take 1 tablet by mouth daily.     dextromethorphan-guaiFENesin (MUCINEX DM) 30-600 MG 12hr tablet Take 1 tablet by mouth 2 (two) times daily as needed for cough.     diclofenac Sodium (VOLTAREN) 1 % GEL Apply 4 g topically 4 (four) times daily as needed. Left knee, right knee, left ankle     digoxin (LANOXIN) 0.125 MG tablet Take 0.125 mg by mouth every other day.     hydrALAZINE (APRESOLINE) 10 MG tablet Take 10 mg by mouth 3 (three) times daily as needed. TID PRN for SBP >170     hydrochlorothiazide (HYDRODIURIL) 50 MG tablet Take 50 mg by mouth daily.     loratadine (CLARITIN) 10 MG tablet Take 10 mg by mouth at bedtime.     LORazepam (ATIVAN) 1 MG tablet Take 1 tablet (1 mg total) by mouth at bedtime. 30 tablet 5    ondansetron (ZOFRAN) 4 MG tablet Take 1 tablet (4 mg total) by mouth every 6 (six) hours as needed for nausea. 20 tablet 0   polyethylene glycol (MIRALAX / GLYCOLAX) 17 g packet Take 17 g by mouth every other day.     potassium chloride SA (KLOR-CON) 20 MEQ tablet Take 20 mEq by mouth daily.     traMADol (ULTRAM) 50 MG tablet Take 1 tablet (50 mg total) by mouth every 6 (six) hours as needed for moderate pain or severe pain. 120 tablet 0   No current facility-administered medications for this visit.     Past Medical History:  Diagnosis Date   Actinic keratosis 05/25/2014   Anxiety    Atrial fibrillation ([place]) 09/21/2010   Basal cell carcinoma 05/25/2014   Multiple removed by Dr. Danella Sensing in March 2015: right neck, left neck, scalp    Bell's palsy 07/18/1979   Cervicalgia 01/31/2012   Closed fracture of lumbar vertebra without mention of spinal cord injury 07/17/2005   Conjunctiva disorder 12/26/2010   Coronary atherosclerosis of native coronary artery 07/17/1997   Cramp of limb 08/16/2009   Degeneration of lumbar or lumbosacral intervertebral disc 01/31/2012   Disturbance of skin  sensation 08/2009   Dizziness and giddiness 11/08/2011   vertigo   External hemorrhoids without mention of complication 2/77/4128   External hemorrhoids without mention of complication 78/67/6720   Ganglion of tendon sheath 08/16/2009   Leg cramp 10/27/2013   Most frequently the right leg.    Long term (current) use of anticoagulants 09/2010   Lumbago 07/2009   Meralgia paresthetica 07/2007   MI, old    Myalgia and myositis, unspecified 01/17/2012   Osteoporosis    Other abnormal blood chemistry 04/08/2012   Other abnormal blood chemistry 2013   hyperglycemia   Other disorder of muscle, ligament, and fascia 04/08/2012   Other specified cardiac dysrhythmias(427.89) 06/27/2010   Pacemaker 05/06/2020   Pain in joint, ankle and foot 10/27/2013   Bilateral since 1998    Pain in joint, shoulder region 08/12/2012    Pain in joint, upper arm 12/26/2010   Pain in limb 01/11/2011   Pain in thoracic spine 01/31/2012   Pathologic fracture of vertebrae 01/22/2012   PVC's (premature ventricular contractions)    Rash and other nonspecific skin eruption 08/02/2011   Senile osteoporosis 07/17/1993   Stroke ([place])    Unspecified essential hypertension 07/17/1997   Varicose veins of lower extremities 08/16/2009   Varicose veins of lower extremities 08/16/2009    ROS:   All systems reviewed and negative except as noted in the HPI.   Past Surgical History:  Procedure Laterality Date   COLONOSCOPY WITH PROPOFOL N/A 10/19/2015   Procedure: COLONOSCOPY WITH PROPOFOL;  Surgeon: Gatha Mayer, MD;  Location: [facility];  Service: Endoscopy;  Laterality: N/A;   CORONARY ARTERY BYPASS GRAFT  1998   x2; LIMA to LAD; SVG to diagonal off bypass   HEMORRHOID SURGERY  08/26/2012   Dr. Brantley Stage   LOOP RECORDER INSERTION N/A 05/13/2018   Procedure: LOOP RECORDER INSERTION;  Surgeon: [name], MD;  Location: [facility];  Service: Cardiovascular;  Laterality: N/A;   LOOP RECORDER REMOVAL N/A 05/26/2019   Procedure: LOOP RECORDER REMOVAL;  Surgeon: [name], MD;  Location: [facility];  Service: Cardiovascular;  Laterality: N/A;   MASS EXCISION Left 11/23/2015   Procedure: LEFT WRIST EXCISION CYST;  Surgeon: Leanora Cover, MD;  Location: [facility];  Service: Orthopedics;  Laterality: Left;   PACEMAKER IMPLANT N/A 05/26/2019   Procedure: PACEMAKER IMPLANT;  Surgeon: [name], MD;  Location: [facility];  Service: Cardiovascular;  Laterality: N/A;   SKIN BIOPSY  01/29/14   (R) neck; (R) scalp, 2 (L) neck; shave biopsy Superficial basal cell carcinoma Dr. Danella Sensing   TONSILLECTOMY  [phone]     Family History  Problem Relation Age of Onset   Heart attack Father 69   Heart disease Father    Hypertension Sister    Heart disease Sister    Heart disease Brother       Social History   Socioeconomic History   Marital status: Widowed    Spouse name: Not on file   Number of children: Not on file   Years of education: Not on file   Highest education level: Not on file  Occupational History   Occupation: Retired Pharmacist, hospital  Tobacco Use   Smoking status: Never   Smokeless tobacco: Never  Vaping Use   Vaping Use: Never used  Substance and Sexual Activity   Alcohol use: Yes    Comment: occasional wine   Drug use: No   Sexual activity: Never  Other Topics Concern   Not on file  Social History Narrative   [facility] at [facility] since [facility]   Widowed 09/03/2016   Never smoked   Alcohol occasional wine   Exercise, house work    DNR , POA, Living Will   Social Determinants of Health   Financial Resource Strain: Not on file  Food Insecurity: Not on file  Transportation Needs: Not on file  Physical Activity: Not on file  Stress: Not on file  Social Connections: Not on file  Intimate Partner Violence: Not on file     BP (!) 144/88   Pulse 78   Ht 5\' 6"  (1.676 m)   Wt 128 lb (58.1 kg)   SpO2 98%   BMI 20.66 kg/m   Physical Exam:  Well appearing NAD HEENT: Unremarkable Neck:  No JVD, no thyromegally Lymphatics:  No adenopathy Back:  No CVA tenderness Lungs:  Clear with no wheezes HEART:  Regular rate rhythm, no murmurs, no rubs, no clicks Abd:  soft, positive bowel sounds, no organomegally, no rebound, no guarding Ext:  2 plus pulses, no edema, no cyanosis, no clubbing Skin:  No rashes no nodules Neuro:  CN II through XII intact, motor grossly intact  EKG - atrial fib with RBBB  DEVICE  Normal device function.  See PaceArt for details.   Assess/Plan:  Perm atrial fib -her vr is well controlled. She will continue her current meds. We discussed the possibility of re-initiation of eliquis. While I would consider, the patient and her daughter feel strongly against anti-coagulation. I carefully explained the 10% risk of a stroke  per year. Also the increased risk of more bleeding. She is not a candidate for Watchman.  PPM -her [employer] single chamber PPM is working normally. 3. CAD - she denies anginal symptoms.  4. HTN - her bp is reasonably well controlled. We will follow.  Susan Conway Susan Kataoka,MD

## 2021-10-10 NOTE — Patient Instructions (Signed)
Medication Instructions:  Your physician recommends that you continue on your current medications as directed. Please refer to the Current Medication list given to you today.  *If you need a refill on your cardiac medications before your next appointment, please call your pharmacy*   Lab Work: None ordered   Testing/Procedures: None ordered   Follow-Up: At [facility], you and your health needs are our priority.  As part of our continuing mission to provide you with exceptional heart care, we have created designated Provider Care Teams.  These Care Teams include your primary Cardiologist (physician) and Advanced Practice Providers (APPs -  Physician Assistants and Nurse Practitioners) who all work together to provide you with the care you need, when you need it.  We recommend signing up for the patient portal called "MyChart".  Sign up information is provided on this After Visit Summary.  MyChart is used to connect with patients for Virtual Visits (Telemedicine).  Patients are able to view lab/test results, encounter notes, upcoming appointments, etc.  Non-urgent messages can be sent to your provider as well.   To learn more about what you can do with MyChart, go to [email or web address].    Remote monitoring is used to monitor your Pacemaker or ICD from home. This monitoring reduces the number of office visits required to check your device to one time per year. It allows [place] to keep an eye on the functioning of your device to ensure it is working properly. You are scheduled for a device check from home on 12/02/2021. You may send your transmission at any time that day. If you have a wireless device, the transmission will be sent automatically. After your physician reviews your transmission, you will receive a postcard with your next transmission date.  Your next appointment:   1 year(s)  The format for your next appointment:   In Person  Provider:   Cristopher Peru, MD   Thank you  for choosing [facility]!!   [phone]

## 2021-10-13 DIAGNOSIS — M6389 Disorders of muscle in diseases classified elsewhere, multiple sites: Secondary | ICD-10-CM | POA: Diagnosis not present

## 2021-10-13 DIAGNOSIS — M84350S Stress fracture, pelvis, sequela: Secondary | ICD-10-CM | POA: Diagnosis not present

## 2021-10-13 DIAGNOSIS — Z9181 History of falling: Secondary | ICD-10-CM | POA: Diagnosis not present

## 2021-10-13 DIAGNOSIS — R278 Other lack of coordination: Secondary | ICD-10-CM | POA: Diagnosis not present

## 2021-10-13 DIAGNOSIS — I6389 Other cerebral infarction: Secondary | ICD-10-CM | POA: Diagnosis not present

## 2021-10-14 DIAGNOSIS — R278 Other lack of coordination: Secondary | ICD-10-CM | POA: Diagnosis not present

## 2021-10-14 DIAGNOSIS — Z9181 History of falling: Secondary | ICD-10-CM | POA: Diagnosis not present

## 2021-10-14 DIAGNOSIS — I6389 Other cerebral infarction: Secondary | ICD-10-CM | POA: Diagnosis not present

## 2021-10-14 DIAGNOSIS — M6389 Disorders of muscle in diseases classified elsewhere, multiple sites: Secondary | ICD-10-CM | POA: Diagnosis not present

## 2021-10-14 DIAGNOSIS — M84350S Stress fracture, pelvis, sequela: Secondary | ICD-10-CM | POA: Diagnosis not present

## 2021-10-17 ENCOUNTER — Ambulatory Visit: Payer: Medicare Other | Admitting: Adult Health

## 2021-10-18 ENCOUNTER — Ambulatory Visit (INDEPENDENT_AMBULATORY_CARE_PROVIDER_SITE_OTHER): Payer: Medicare Other | Admitting: Podiatry

## 2021-10-18 ENCOUNTER — Other Ambulatory Visit: Payer: Self-pay

## 2021-10-18 DIAGNOSIS — L84 Corns and callosities: Secondary | ICD-10-CM

## 2021-10-18 DIAGNOSIS — M79675 Pain in left toe(s): Secondary | ICD-10-CM | POA: Diagnosis not present

## 2021-10-18 DIAGNOSIS — B351 Tinea unguium: Secondary | ICD-10-CM

## 2021-10-18 DIAGNOSIS — I6389 Other cerebral infarction: Secondary | ICD-10-CM | POA: Diagnosis not present

## 2021-10-18 DIAGNOSIS — R278 Other lack of coordination: Secondary | ICD-10-CM | POA: Diagnosis not present

## 2021-10-18 DIAGNOSIS — M79674 Pain in right toe(s): Secondary | ICD-10-CM

## 2021-10-18 DIAGNOSIS — M6389 Disorders of muscle in diseases classified elsewhere, multiple sites: Secondary | ICD-10-CM | POA: Diagnosis not present

## 2021-10-18 DIAGNOSIS — M84350S Stress fracture, pelvis, sequela: Secondary | ICD-10-CM | POA: Diagnosis not present

## 2021-10-18 DIAGNOSIS — Z9181 History of falling: Secondary | ICD-10-CM | POA: Diagnosis not present

## 2021-10-19 DIAGNOSIS — R278 Other lack of coordination: Secondary | ICD-10-CM | POA: Diagnosis not present

## 2021-10-19 DIAGNOSIS — M6389 Disorders of muscle in diseases classified elsewhere, multiple sites: Secondary | ICD-10-CM | POA: Diagnosis not present

## 2021-10-19 DIAGNOSIS — M84350S Stress fracture, pelvis, sequela: Secondary | ICD-10-CM | POA: Diagnosis not present

## 2021-10-19 DIAGNOSIS — I6389 Other cerebral infarction: Secondary | ICD-10-CM | POA: Diagnosis not present

## 2021-10-19 DIAGNOSIS — Z9181 History of falling: Secondary | ICD-10-CM | POA: Diagnosis not present

## 2021-10-21 NOTE — Progress Notes (Signed)
Subjective: 87 year old female presents the office today for concerns of left big toenail fungus as well as a corn on her right fifth toe.  Nails in general are elongated and thickened.  She still been using the tea tree oil on the nails.  Nails do cause discomfort at times.  No swelling redness or drainage.  No other concerns.   Objective: AAO x3, NAD-daughter present DP/PT pulses palpable bilaterally, CRT less than 3 seconds The nails are hypertrophic, dystrophic with yellow-brown discoloration mostly over the hallux toenail.  There is no edema, erythema or signs of infection.  The nails do cause tenderness 1-5 bilaterally.   There is a hyperkeratotic lesion on the right fifth toe which causes discomfort.  There is no underlying ulceration drainage or any obvious signs of infection.  Adductovarus present of the digit. The second toe was somewhat longer than the other digits. No pain with calf compression, swelling, warmth, erythema  Assessment: Symptomatic onychomycosis, hyperkeratotic lesion  Plan: -All treatment options discussed with the patient including all alternatives, risks, complications.  -I sharply debrided the nails x10 without any complications or bleeding.  Again discussed treatment options but also appear to be difficult to treat at this point.  Continue topical oils. -Sharply debrided the hyperkeratotic lesion x1 without any complications or bleeding as a courtesy.  Offloading padding avoid shoes that put excess pressure. -Patient encouraged to call the office with any questions, concerns, change in symptoms.   Trula Slade DPM

## 2021-10-25 ENCOUNTER — Encounter: Payer: Self-pay | Admitting: Orthopedic Surgery

## 2021-10-25 ENCOUNTER — Non-Acute Institutional Stay (SKILLED_NURSING_FACILITY): Payer: Medicare Other | Admitting: Orthopedic Surgery

## 2021-10-25 DIAGNOSIS — D696 Thrombocytopenia, unspecified: Secondary | ICD-10-CM | POA: Diagnosis not present

## 2021-10-25 DIAGNOSIS — K5901 Slow transit constipation: Secondary | ICD-10-CM | POA: Diagnosis not present

## 2021-10-25 DIAGNOSIS — F419 Anxiety disorder, unspecified: Secondary | ICD-10-CM | POA: Diagnosis not present

## 2021-10-25 DIAGNOSIS — M542 Cervicalgia: Secondary | ICD-10-CM

## 2021-10-25 DIAGNOSIS — E876 Hypokalemia: Secondary | ICD-10-CM

## 2021-10-25 DIAGNOSIS — I10 Essential (primary) hypertension: Secondary | ICD-10-CM

## 2021-10-25 DIAGNOSIS — I4821 Permanent atrial fibrillation: Secondary | ICD-10-CM | POA: Diagnosis not present

## 2021-10-25 DIAGNOSIS — G8929 Other chronic pain: Secondary | ICD-10-CM

## 2021-10-25 DIAGNOSIS — E871 Hypo-osmolality and hyponatremia: Secondary | ICD-10-CM

## 2021-10-25 DIAGNOSIS — I69354 Hemiplegia and hemiparesis following cerebral infarction affecting left non-dominant side: Secondary | ICD-10-CM

## 2021-10-25 DIAGNOSIS — M25561 Pain in right knee: Secondary | ICD-10-CM | POA: Diagnosis not present

## 2021-10-25 DIAGNOSIS — B351 Tinea unguium: Secondary | ICD-10-CM | POA: Diagnosis not present

## 2021-10-25 DIAGNOSIS — R1312 Dysphagia, oropharyngeal phase: Secondary | ICD-10-CM

## 2021-10-25 NOTE — Progress Notes (Signed)
Location:  [facility] Room Number: 118A Place of Service:  SNF [phone]) Provider:  Windell Moulding, AGNP-C  Susan Dad, MD  Patient Care Team: Susan Dad, MD as PCP - General (Internal Medicine) [name], MD as PCP - Electrophysiology (Cardiology) [name], MD as Consulting Physician (Dermatology) [name], MD as Consulting Physician (Cardiology) [facility] Susan Finner, MD as Consulting Physician (Dermatology)  Extended Emergency Contact Information Primary Emergency Contact: [name] of [place] Phone: [phone] Mobile Phone: [phone] Relation: Daughter Secondary Emergency Contact: Conway,Susan  Johnnette Conway of [place] Phone: [phone] Relation: Son  Code Status:  DNR Goals of care: Advanced Directive information Advanced Directives 09/27/2021  Does Patient Have a Medical Advance Directive? Yes  Type of Advance Directive Living will;Out of facility DNR (pink MOST or yellow form)  Does patient want to make changes to medical advance directive? No - Patient declined  Copy of [place] in Chart? -  Pre-existing out of facility DNR order (yellow form or pink MOST form) Yellow form placed in chart (order not valid for inpatient use);Pink MOST form placed in chart (order not valid for inpatient use)     Chief Complaint  Patient presents with   Medical Management of Chronic Issues    HPI:  Susan Conway is a 87 y.o. female seen today for medical management of chronic diseases.    She currently resides on the skilled nursing unit at [facility]. Past medical history includes: atrial fibrillation, AV block, CAD, hypertension, GERD, stroke, left sided paralysis, constipation, and dysphagia.   Cervicalgia- neck and shoulder pain improved, reports some neck stiffness- believes it is related to sleeping position, requesting voltaren gel to neck  prn Onychomycosis- 12/13 seen by Dr. Jacqualyn Posey, toenails trimmed, unsuccessful trial of tea tree( >3 months) oil to left great toe  Left sided hemiplegia- ambulating with Susan Conway and daughter, uses PWC, one person assist with transfers, remains on aspirin, recently seen by cardiology, at this time she does not want to be on anticoagulant or statin for stroke prevention- LDL 122 09/26/2021 Dysphagia- no recent aspirations, remains on regular diet HTN- BUN/creat 25/1.0 09/26/2021, elevated pressures sometimes in evening, remains on amlodipine/atenolol/hydralazine prn/ HCTZ Afib- rate controlled with digoxin, remains on aspirin for clot prevention Right knee pain- denies increased pain, remains on Tramadol prn Thrombocytopenia- platelet count 125 09/26/2021 Hypokalemia- K+ 3.8 09/26/2021 Hyponatremia- Na+ 141 09/26/2021, remains on HCTZ Constipation- LBM 12/20, remains on miralax QOD Anxiety- no recent panic attacks, increased anxiety in evening, lorazepam qhs  No recent falls or injuries.   Recent blood pressures:  12/20- 144/68  12/18- 127/73  12/17- 128/79  Recent weights:  12/01- 126.5 lbs  11/07- 128.3 lbs  10/12- 114 lbs       Past Medical History:  Diagnosis Date   Actinic keratosis 05/25/2014   Anxiety    Atrial fibrillation ([place]) 09/21/2010   Basal cell carcinoma 05/25/2014   Multiple removed by Dr. Danella Sensing in March 2015: right neck, left neck, scalp    Bell's palsy 07/18/1979   Cervicalgia 01/31/2012   Closed fracture of lumbar vertebra without mention of spinal cord injury 07/17/2005   Conjunctiva disorder 12/26/2010   Coronary atherosclerosis of native coronary artery 07/17/1997   Cramp of limb 08/16/2009   Degeneration of lumbar or lumbosacral intervertebral disc 01/31/2012   Disturbance of skin sensation 08/2009   Dizziness and giddiness 11/08/2011   vertigo   External hemorrhoids  without mention of complication 9/50/9326   External hemorrhoids without mention of  complication 71/24/5809   Ganglion of tendon sheath 08/16/2009   Leg cramp 10/27/2013   Most frequently the right leg.    Long term (current) use of anticoagulants 09/2010   Lumbago 07/2009   Meralgia paresthetica 07/2007   MI, old    Myalgia and myositis, unspecified 01/17/2012   Osteoporosis    Other abnormal blood chemistry 04/08/2012   Other abnormal blood chemistry 2013   hyperglycemia   Other disorder of muscle, ligament, and fascia 04/08/2012   Other specified cardiac dysrhythmias(427.89) 06/27/2010   Pacemaker 05/06/2020   Pain in joint, ankle and foot 10/27/2013   Bilateral since 1998    Pain in joint, shoulder region 08/12/2012   Pain in joint, upper arm 12/26/2010   Pain in limb 01/11/2011   Pain in thoracic spine 01/31/2012   Pathologic fracture of vertebrae 01/22/2012   PVC's (premature ventricular contractions)    Rash and other nonspecific skin eruption 08/02/2011   Senile osteoporosis 07/17/1993   Stroke ([place])    Unspecified essential hypertension 07/17/1997   Varicose veins of lower extremities 08/16/2009   Varicose veins of lower extremities 08/16/2009   Past Surgical History:  Procedure Laterality Date   COLONOSCOPY WITH PROPOFOL N/A 10/19/2015   Procedure: COLONOSCOPY WITH PROPOFOL;  Surgeon: Gatha Mayer, MD;  Location: [facility];  Service: Endoscopy;  Laterality: N/A;   CORONARY ARTERY BYPASS GRAFT  1998   x2; LIMA to LAD; SVG to diagonal off bypass   HEMORRHOID SURGERY  08/26/2012   Dr. Brantley Stage   LOOP RECORDER INSERTION N/A 05/13/2018   Procedure: LOOP RECORDER INSERTION;  Surgeon: [name], MD;  Location: [facility];  Service: Cardiovascular;  Laterality: N/A;   LOOP RECORDER REMOVAL N/A 05/26/2019   Procedure: LOOP RECORDER REMOVAL;  Surgeon: [name], MD;  Location: [facility];  Service: Cardiovascular;  Laterality: N/A;   MASS EXCISION Left 11/23/2015   Procedure: LEFT WRIST EXCISION CYST;  Surgeon: Leanora Cover, MD;  Location: [facility];  Service: Orthopedics;  Laterality: Left;   PACEMAKER IMPLANT N/A 05/26/2019   Procedure: PACEMAKER IMPLANT;  Surgeon: [name], MD;  Location: [facility];  Service: Cardiovascular;  Laterality: N/A;   SKIN BIOPSY  01/29/14   (R) neck; (R) scalp, 2 (L) neck; shave biopsy Superficial basal cell carcinoma Dr. Danella Sensing   TONSILLECTOMY  [phone]    Allergies  Allergen Reactions   Iodinated Diagnostic Agents Nausea Only and Other (See Comments)    Severe nausea and also passed out   Iodine    Other     Seasonal Allergies.   Bactrim Rash   Relafen [Nabumetone] Rash   Sulfanilamide Rash    Outpatient Encounter Medications as of 10/25/2021  Medication Sig   acetaminophen (TYLENOL) 500 MG tablet Take 1,000 mg by mouth every 6 (six) hours as needed for mild pain (pain.).    amLODipine (NORVASC) 5 MG tablet Take 5 mg by mouth daily.   aspirin EC 81 MG EC tablet Take 1 tablet (81 mg total) by mouth daily. Swallow whole.   atenolol (TENORMIN) 50 MG tablet Take 50 mg by mouth 2 (two) times daily. HOLD FOR SPB <90 or HR< 60   calcium-vitamin D (OSCAL WITH D) 500-200 MG-UNIT tablet Take 1 tablet by mouth daily.   dextromethorphan-guaiFENesin (MUCINEX DM) 30-600 MG 12hr tablet Take 1 tablet by mouth 2 (two) times  daily as needed for cough.   diclofenac Sodium (VOLTAREN) 1 % GEL Apply 4 g topically 4 (four) times daily as needed. Left knee, right knee, left ankle   digoxin (LANOXIN) 0.125 MG tablet Take 0.125 mg by mouth every other day.   hydrALAZINE (APRESOLINE) 10 MG tablet Take 10 mg by mouth 3 (three) times daily as needed. TID PRN for SBP >170   hydrochlorothiazide (HYDRODIURIL) 50 MG tablet Take 50 mg by mouth daily.   loratadine (CLARITIN) 10 MG tablet Take 10 mg by mouth at bedtime.   LORazepam (ATIVAN) 1 MG tablet Take 1 tablet (1 mg total) by mouth at bedtime.   ondansetron (ZOFRAN) 4 MG tablet Take 1 tablet (4 mg total) by mouth every 6 (six) hours as  needed for nausea.   polyethylene glycol (MIRALAX / GLYCOLAX) 17 g packet Take 17 g by mouth every other day.   potassium chloride SA (KLOR-CON) 20 MEQ tablet Take 20 mEq by mouth daily.   traMADol (ULTRAM) 50 MG tablet Take 1 tablet (50 mg total) by mouth every 6 (six) hours as needed for moderate pain or severe pain.   No facility-administered encounter medications on file as of 10/25/2021.    Review of Systems  Constitutional:  Negative for activity change, appetite change, chills, fatigue and fever.  HENT:  Positive for hearing loss and trouble swallowing. Negative for congestion.   Respiratory:  Negative for cough, shortness of breath and wheezing.   Cardiovascular:  Negative for chest pain and leg swelling.  Gastrointestinal:  Positive for constipation. Negative for abdominal distention, abdominal pain, diarrhea and vomiting.  Genitourinary:  Negative for dysuria, frequency and hematuria.  Musculoskeletal:  Positive for arthralgias and gait problem.  Skin:  Negative for rash and wound.  Neurological:  Positive for weakness. Negative for dizziness and headaches.  Psychiatric/Behavioral:  Negative for confusion, dysphoric mood and sleep disturbance. The patient is not nervous/anxious.    Immunization History  Administered Date(s) Administered   Influenza Whole 08/06/2012   Influenza, High Dose Seasonal PF 08/15/2019, 08/27/2020   Influenza,inj,Quad PF,6+ Mos 08/30/2018   Influenza-Unspecified 08/06/2013, 08/24/2014, 08/26/2015, 08/31/2016, 08/27/2017, 08/27/2020   Moderna Sars-Covid-2 Vaccination 11/16/2019, 12/16/2019, 09/16/2020   Pneumococcal Conjugate-13 09/22/2015   Pneumococcal Polysaccharide-23 08/29/2006   Td 10/07/2007   Zoster Recombinat (Shingrix) 02/12/2018, 05/13/2018   Zoster, Live 03/12/2008   Pertinent  Health Maintenance Due  Topic Date Due   INFLUENZA VACCINE  02/03/2022 (Originally 06/06/2021)   DEXA SCAN  Completed   Fall Risk 08/23/2020 08/23/2020  08/26/2020 09/23/2020 09/27/2021  Falls in the past year? - - 1 1 0  Number of falls in past year - - - - -  Was there an injury with Fall? - - 1 1 0  Was there an injury with Fall? - - - Patient fractured pelvis and had a stroke -  Fall Risk Category Calculator - - 2 2 0  Fall Risk Category - - Moderate Moderate Low  Patient Fall Risk Level High fall risk High fall risk - Moderate fall risk Low fall risk  Patient at Risk for Falls Due to - - History of fall(s);Impaired balance/gait;Impaired mobility;Orthopedic patient - No Fall Risks  Fall risk Follow up - - Falls evaluation completed;Education provided;Falls prevention discussed;Follow up appointment - Falls evaluation completed   Functional Status Survey:    Vitals:   10/25/21 1632  BP: (!) 144/68  Pulse: 68  Resp: 16  Temp: (!) 97 F (36.1 C)  SpO2: 95%  Weight:  126 lb 8 oz (57.4 kg)  Height: 5\' 6"  (1.676 m)   Body mass index is 20.42 kg/m. Physical Exam Vitals reviewed.  Constitutional:      General: She is not in acute distress. HENT:     Head: Normocephalic.     Right Ear: There is no impacted cerumen.     Left Ear: There is no impacted cerumen.     Nose: Nose normal.     Mouth/Throat:     Mouth: Mucous membranes are moist.  Eyes:     General:        Right eye: No discharge.        Left eye: No discharge.  Neck:     Vascular: No carotid bruit.  Cardiovascular:     Rate and Rhythm: Normal rate. Rhythm irregular.     Pulses: Normal pulses.     Heart sounds: Normal heart sounds. No murmur heard. Pulmonary:     Effort: Pulmonary effort is normal. No respiratory distress.     Breath sounds: Normal breath sounds. No wheezing.  Abdominal:     General: Bowel sounds are normal. There is no distension.     Palpations: Abdomen is soft.     Tenderness: There is no abdominal tenderness.  Musculoskeletal:     Cervical back: Normal range of motion.     Right lower leg: No edema.     Left lower leg: No edema.   Lymphadenopathy:     Cervical: No cervical adenopathy.  Skin:    General: Skin is warm and dry.     Capillary Refill: Capillary refill takes less than 2 seconds.  Neurological:     General: No focal deficit present.     Mental Status: She is alert and oriented to person, place, and time.     Motor: Weakness present.     Gait: Gait abnormal.     Comments: Left sided facial droop, hemiparesis  Psychiatric:        Mood and Affect: Mood normal.        Behavior: Behavior normal.    Labs reviewed: Recent Labs    08/05/21 0000 08/16/21 0000 09/26/21 0000  NA 136* 133* 141  K 3.6 4.4 3.8  CL 96* 92* 100  CO2 31* 29* 29*  BUN 26* 18 25*  CREATININE 1.1 0.8 1.0  CALCIUM 9.1 8.9 9.4   Recent Labs    08/16/21 0000 09/26/21 0000  AST 30 15  ALT 23 8  ALKPHOS 87 90  ALBUMIN 3.1* 3.6   Recent Labs    05/20/21 0000 08/16/21 0000 09/26/21 0000  WBC 5.7 11.0 6.7  HGB 12.7 10.8* 11.5*  HCT 37 32* 36  PLT 121* 165 125*   Lab Results  Component Value Date   TSH 2.48 03/22/2021   Lab Results  Component Value Date   HGBA1C 5.7 (H) 08/19/2020   Lab Results  Component Value Date   CHOL 180 09/26/2021   HDL 44 09/26/2021   LDLCALC 122 09/26/2021   LDLDIRECT 81.3 08/19/2020   TRIG 70 09/26/2021   CHOLHDL 2 09/12/2010    Significant Diagnostic Results in last 30 days:  No results found.  Assessment/Plan 1. Cervicalgia - improved - related to sleep position - Voltaren gel 1%- apply to neck TID prn  2. Onychomycosis - followed by podiatry - unsuccessful trial of tea tree oil  3. 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 Oropharyngeal dysphagia - no recent aspirations - cont regular diet  5. Essential hypertension -  controlled - cont norvasc, toprol and HCTZ  6. Permanent atrial fibrillation (HCC) - HR controlled with digoxin and toprol - cont asa for clot prevention  7. Chronic pain of right knee - cont Tramadol  8. Thrombocytopenia (HCC) - platelets 125 09/26/2021  9. Hypokalemia - K 3.8 09/26/2021  10. Hyponatremia - Na 141 09/26/2021  11. Slow transit constipation - LBM 12/20, abdomen soft - cont miralax daily  12. Anxiety - no recent panic attacks - cont lorazepam     Family/ staff Communication: plan discussed with patient and nurse  Labs/tests ordered:  none

## 2021-11-01 DIAGNOSIS — Z9181 History of falling: Secondary | ICD-10-CM | POA: Diagnosis not present

## 2021-11-01 DIAGNOSIS — M84350S Stress fracture, pelvis, sequela: Secondary | ICD-10-CM | POA: Diagnosis not present

## 2021-11-01 DIAGNOSIS — M6389 Disorders of muscle in diseases classified elsewhere, multiple sites: Secondary | ICD-10-CM | POA: Diagnosis not present

## 2021-11-01 DIAGNOSIS — R278 Other lack of coordination: Secondary | ICD-10-CM | POA: Diagnosis not present

## 2021-11-01 DIAGNOSIS — I6389 Other cerebral infarction: Secondary | ICD-10-CM | POA: Diagnosis not present

## 2021-12-01 ENCOUNTER — Non-Acute Institutional Stay (SKILLED_NURSING_FACILITY): Payer: Medicare Other | Admitting: Adult Health

## 2021-12-01 ENCOUNTER — Encounter: Payer: Self-pay | Admitting: Adult Health

## 2021-12-01 DIAGNOSIS — I69354 Hemiplegia and hemiparesis following cerebral infarction affecting left non-dominant side: Secondary | ICD-10-CM

## 2021-12-01 DIAGNOSIS — M545 Low back pain, unspecified: Secondary | ICD-10-CM | POA: Diagnosis not present

## 2021-12-01 DIAGNOSIS — I442 Atrioventricular block, complete: Secondary | ICD-10-CM

## 2021-12-01 DIAGNOSIS — K5901 Slow transit constipation: Secondary | ICD-10-CM

## 2021-12-01 DIAGNOSIS — J302 Other seasonal allergic rhinitis: Secondary | ICD-10-CM | POA: Diagnosis not present

## 2021-12-01 DIAGNOSIS — I10 Essential (primary) hypertension: Secondary | ICD-10-CM

## 2021-12-01 DIAGNOSIS — I4821 Permanent atrial fibrillation: Secondary | ICD-10-CM | POA: Diagnosis not present

## 2021-12-01 DIAGNOSIS — D696 Thrombocytopenia, unspecified: Secondary | ICD-10-CM | POA: Diagnosis not present

## 2021-12-01 NOTE — Progress Notes (Signed)
Location:  [facility] Room Number: 118-A Place of Service:  SNF [id number]) Provider:  Royal Hawthorn, NP   Patient Care Team: [name], MD as PCP - General (Internal Medicine) [name], MD as PCP - Electrophysiology (Cardiology) [name], MD as Consulting Physician (Dermatology) [name], MD as Consulting Physician (Cardiology) [facility] [name], MD as Consulting Physician (Dermatology)  Extended Emergency Contact Information Primary Emergency Contact: [name] of [place] Phone: [phone] Mobile Phone: [phone] Relation: Daughter Secondary Emergency Contact: [name],[name]  [name] of [place] Phone: [phone] Relation: Son  Code Status:  DNR Goals of care: Advanced Directive information Advanced Directives 12/01/2021  Does Patient Have a Medical Advance Directive? Yes  Type of Paramedic of [place];Living will;Out of facility DNR (pink MOST or yellow form)  Does patient want to make changes to medical advance directive? No - Patient declined  Copy of [place] in Chart? Yes - validated most recent copy scanned in chart (See row information)  Pre-existing out of facility DNR order (yellow form or pink MOST form) Yellow form placed in chart (order not valid for inpatient use);Pink MOST form placed in chart (order not valid for inpatient use)     Chief Complaint  Patient presents with   Medical Management of Chronic Issues    Routine visit and discuss need for td/tdap or post pone if patient refuses    Acute Visit    Patient c/o congestion     HPI:  Susan Conway is a 88 y.o. female seen today for an acute visit for nasal congestion, also medical management of chronic diseases.   PMH significant for CVA with left sided weakness, dysarthria, afib, GIB, GERD, CAD, knee pain, HTN, pacemaker, HLD, OP, thrombocytopenia,  anemia, and constipation.   She reports a 50+ year hx of seasonal allergies. Since living in skilled care and in the past few months symptoms of nasal congestion and drip. No sore throat, cough, or fever.  She is on claritin and flonase with some relief. She would like to try something else.  She is also reports low back pain and some left sided pain. It does not radiate, no red flag signs. She would like to try voltaren. She has tried robaxin in the past with relief.   LDL 122   Plt , no issues with bleeding  Continues to walk, remains social, uses a power wheel chair.   No issues chewing or swallowing on a regular diet. Has residual dysarthria.   Past Medical History:  Diagnosis Date   Actinic keratosis 05/25/2014   Anxiety    Atrial fibrillation ([place]) 09/21/2010   Basal cell carcinoma 05/25/2014   Multiple removed by Dr. Danella Sensing in March 2015: right neck, left neck, scalp    Bell's palsy 07/18/1979   Cervicalgia 01/31/2012   Closed fracture of lumbar vertebra without mention of spinal cord injury 07/17/2005   Conjunctiva disorder 12/26/2010   Coronary atherosclerosis of native coronary artery 07/17/1997   Cramp of limb 08/16/2009   Degeneration of lumbar or lumbosacral intervertebral disc 01/31/2012   Disturbance of skin sensation 08/2009   Dizziness and giddiness 11/08/2011   vertigo   External hemorrhoids without mention of complication 6/64/4034   External hemorrhoids without mention of complication 74/25/9563   Ganglion of tendon sheath 08/16/2009   Leg cramp 10/27/2013   Most frequently the right leg.    Long term (current) use of  anticoagulants 09/2010   Lumbago 07/2009   Meralgia paresthetica 07/2007   MI, old    Myalgia and myositis, unspecified 01/17/2012   Osteoporosis    Other abnormal blood chemistry 04/08/2012   Other abnormal blood chemistry 2013   hyperglycemia   Other disorder of muscle, ligament, and fascia 04/08/2012   Other specified cardiac  dysrhythmias(427.89) 06/27/2010   Pacemaker 05/06/2020   Pain in joint, ankle and foot 10/27/2013   Bilateral since 1998    Pain in joint, shoulder region 08/12/2012   Pain in joint, upper arm 12/26/2010   Pain in limb 01/11/2011   Pain in thoracic spine 01/31/2012   Pathologic fracture of vertebrae 01/22/2012   PVC's (premature ventricular contractions)    Rash and other nonspecific skin eruption 08/02/2011   Senile osteoporosis 07/17/1993   Stroke ([place])    Unspecified essential hypertension 07/17/1997   Varicose veins of lower extremities 08/16/2009   Varicose veins of lower extremities 08/16/2009   Past Surgical History:  Procedure Laterality Date   COLONOSCOPY WITH PROPOFOL N/A 10/19/2015   Procedure: COLONOSCOPY WITH PROPOFOL;  Surgeon: Gatha Mayer, MD;  Location: [facility];  Service: Endoscopy;  Laterality: N/A;   CORONARY ARTERY BYPASS GRAFT  1998   x2; LIMA to LAD; SVG to diagonal off bypass   HEMORRHOID SURGERY  08/26/2012   Dr. Brantley Stage   LOOP RECORDER INSERTION N/A 05/13/2018   Procedure: LOOP RECORDER INSERTION;  Surgeon: [name], MD;  Location: [facility];  Service: Cardiovascular;  Laterality: N/A;   LOOP RECORDER REMOVAL N/A 05/26/2019   Procedure: LOOP RECORDER REMOVAL;  Surgeon: [name], MD;  Location: [facility];  Service: Cardiovascular;  Laterality: N/A;   MASS EXCISION Left 11/23/2015   Procedure: LEFT WRIST EXCISION CYST;  Surgeon: Leanora Cover, MD;  Location: [facility];  Service: Orthopedics;  Laterality: Left;   PACEMAKER IMPLANT N/A 05/26/2019   Procedure: PACEMAKER IMPLANT;  Surgeon: [name], MD;  Location: [facility];  Service: Cardiovascular;  Laterality: N/A;   SKIN BIOPSY  01/29/14   (R) neck; (R) scalp, 2 (L) neck; shave biopsy Superficial basal cell carcinoma Dr. Danella Sensing   TONSILLECTOMY  [phone]    Allergies  Allergen Reactions   Iodinated Contrast Media Nausea Only and Other (See Comments)     Severe nausea and also passed out   Iodine    Other     Seasonal Allergies.   Bactrim Rash   Relafen [Nabumetone] Rash   Sulfanilamide Rash    Outpatient Encounter Medications as of 12/01/2021  Medication Sig   acetaminophen (TYLENOL) 500 MG tablet Take 1,000 mg by mouth every 6 (six) hours as needed for mild pain (pain.).    amLODipine (NORVASC) 5 MG tablet Take 5 mg by mouth daily.   aspirin EC 81 MG EC tablet Take 1 tablet (81 mg total) by mouth daily. Swallow whole.   atenolol (TENORMIN) 50 MG tablet Take 50 mg by mouth 2 (two) times daily. HOLD FOR SPB <90 or HR< 60   calcium-vitamin D (OSCAL WITH D) 500-200 MG-UNIT tablet Take 1 tablet by mouth daily.   diclofenac Sodium (VOLTAREN) 1 % GEL Apply 4 g topically 4 (four) times daily as needed. Left knee, right knee, left ankle   digoxin (LANOXIN) 0.125 MG tablet Take 0.125 mg by mouth every other day.   fluticasone (FLONASE) 50 MCG/ACT nasal spray Place 1 spray into both nostrils daily.   hydrALAZINE (APRESOLINE)  10 MG tablet Take 10 mg by mouth 3 (three) times daily as needed. TID PRN for SBP >170   hydrochlorothiazide (HYDRODIURIL) 50 MG tablet Take 50 mg by mouth daily.   loratadine (CLARITIN) 10 MG tablet Take 10 mg by mouth at bedtime.   LORazepam (ATIVAN) 1 MG tablet Take 1 tablet (1 mg total) by mouth at bedtime.   ondansetron (ZOFRAN) 4 MG tablet Take 1 tablet (4 mg total) by mouth every 6 (six) hours as needed for nausea.   polyethylene glycol (MIRALAX / GLYCOLAX) 17 g packet Take 17 g by mouth every other day.   potassium chloride SA (KLOR-CON) 20 MEQ tablet Take 20 mEq by mouth daily.   traMADol (ULTRAM) 50 MG tablet Take 1 tablet (50 mg total) by mouth every 6 (six) hours as needed for moderate pain or severe pain.   [DISCONTINUED] dextromethorphan-guaiFENesin (MUCINEX DM) 30-600 MG 12hr tablet Take 1 tablet by mouth 2 (two) times daily as needed for cough.   No facility-administered encounter medications on file as of  12/01/2021.    Review of Systems  Constitutional:  Negative for activity change, appetite change, chills, diaphoresis, fatigue, fever and unexpected weight change.  HENT:  Positive for congestion and rhinorrhea. Negative for sneezing, sore throat and trouble swallowing.   Respiratory:  Negative for cough, shortness of breath and wheezing.   Cardiovascular:  Negative for chest pain, palpitations and leg swelling.  Gastrointestinal:  Negative for abdominal distention, abdominal pain, constipation and diarrhea.  Genitourinary:  Negative for difficulty urinating and dysuria.  Musculoskeletal:  Positive for gait problem. Negative for arthralgias, back pain, joint swelling and myalgias.  Neurological:  Positive for speech difficulty and weakness. Negative for dizziness, tremors, seizures, syncope, facial asymmetry, light-headedness, numbness and headaches.  Psychiatric/Behavioral:  Negative for agitation, behavioral problems and confusion.    Immunization History  Administered Date(s) Administered   Influenza Whole 08/06/2012   Influenza, High Dose Seasonal PF 08/15/2019, 08/27/2020   Influenza,inj,Quad PF,6+ Mos 08/30/2018   Influenza-Unspecified 08/06/2013, 08/24/2014, 08/26/2015, 08/31/2016, 08/27/2017, 08/27/2020   Moderna Sars-Covid-2 Vaccination 11/16/2019, 12/16/2019, 09/16/2020   Pneumococcal Conjugate-13 09/22/2015   Pneumococcal Polysaccharide-23 08/29/2006   Td 10/07/2007   Zoster Recombinat (Shingrix) 02/12/2018, 05/13/2018   Zoster, Live 03/12/2008   Pertinent  Health Maintenance Due  Topic Date Due   INFLUENZA VACCINE  02/03/2022 (Originally 06/06/2021)   DEXA SCAN  Completed   Fall Risk 08/23/2020 08/23/2020 08/26/2020 09/23/2020 09/27/2021  Falls in the past year? - - 1 1 0  Number of falls in past year - - - - -  Was there an injury with Fall? - - 1 1 0  Was there an injury with Fall? - - - Patient fractured pelvis and had a stroke -  Fall Risk Category Calculator - - 2 2  0  Fall Risk Category - - Moderate Moderate Low  Patient Fall Risk Level High fall risk High fall risk - Moderate fall risk Low fall risk  Patient at Risk for Falls Due to - - History of fall(s);Impaired balance/gait;Impaired mobility;Orthopedic patient - No Fall Risks  Fall risk Follow up - - Falls evaluation completed;Education provided;Falls prevention discussed;Follow up appointment - Falls evaluation completed   Functional Status Survey:    Vitals:   12/01/21 1058  BP: (!) 142/79  Pulse: 69  Resp: 18  Temp: (!) 96.8 F (36 C)  SpO2: 97%  Weight: 125 lb 6.4 oz (56.9 kg)  Height: 5\' 6"  (1.676 m)   Body mass index  is 20.24 kg/m. Physical Exam Vitals and nursing note reviewed.  Constitutional:      General: She is not in acute distress.    Appearance: She is not diaphoretic.  HENT:     Head: Normocephalic and atraumatic.     Nose: Congestion present.     Comments: Nasal septum boggy, pale, erythema    Mouth/Throat:     Mouth: Mucous membranes are moist.     Pharynx: Oropharynx is clear.  Eyes:     Conjunctiva/sclera: Conjunctivae normal.     Pupils: Pupils are equal, round, and reactive to light.  Neck:     Vascular: No JVD.  Cardiovascular:     Rate and Rhythm: Normal rate. Rhythm irregular.     Heart sounds: No murmur heard. Pulmonary:     Effort: Pulmonary effort is normal. No respiratory distress.     Breath sounds: Normal breath sounds. No wheezing.  Musculoskeletal:        General: No swelling, tenderness, deformity or signs of injury.  Skin:    General: Skin is warm and dry.  Neurological:     Mental Status: She is alert and oriented to person, place, and time.     Comments: Left sided weakness  Psychiatric:        Mood and Affect: Mood normal.    Labs reviewed: Recent Labs    08/05/21 0000 08/16/21 0000 09/26/21 0000  NA 136* 133* 141  K 3.6 4.4 3.8  CL 96* 92* 100  CO2 31* 29* 29*  BUN 26* 18 25*  CREATININE 1.1 0.8 1.0  CALCIUM 9.1 8.9  9.4   Recent Labs    08/16/21 0000 09/26/21 0000  AST 30 15  ALT 23 8  ALKPHOS 87 90  ALBUMIN 3.1* 3.6   Recent Labs    05/20/21 0000 08/16/21 0000 09/26/21 0000  WBC 5.7 11.0 6.7  HGB 12.7 10.8* 11.5*  HCT 37 32* 36  PLT 121* 165 125*   Lab Results  Component Value Date   TSH 2.48 03/22/2021   Lab Results  Component Value Date   HGBA1C 5.7 (H) 08/19/2020   Lab Results  Component Value Date   CHOL 180 09/26/2021   HDL 44 09/26/2021   LDLCALC 122 09/26/2021   LDLDIRECT 81.3 08/19/2020   TRIG 70 09/26/2021   CHOLHDL 2 09/12/2010    Significant Diagnostic Results in last 30 days:  No results found.  Assessment/Plan  1. Seasonal allergies Continue flonase and claritin. Try astelin, be sure to rinse your mouth afterwards. If not working after two weeks could consider atrovent - azelastine (ASTELIN) 0.1 % nasal spray; Place 2 sprays into both nostrils 2 (two) times daily. Use in each nostril as directed  Dispense: 30 mL; Refill: 12  2. Acute left-sided low back pain without sciatica Mild, no radiculopathy. Try voltaren and robaxin as needed.  - methocarbamol (ROBAXIN) 500 MG tablet; Take 0.5 tablets (250 mg total) by mouth 2 (two) times daily as needed for muscle spasms.  Dispense: 30 tablet; Refill: 0  3. Essential hypertension Controlled with atenolol, norvasc, and HCTZ  4. Atrioventricular block, complete ([place]) S/p pacemaker Remove transmission 09/02/21  5. Permanent atrial fibrillation (HCC) Rate is controlled No longer on anticoagulation due to bleeding, see Dr. Forde Dandy note 10/10/21  6. 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   7. Thrombocytopenia ([place]) Lab Results  Component Value Date   WBC 6.7 09/26/2021   HGB 11.5 (A) 09/26/2021   HCT 36 09/26/2021   MCV 97.6 08/23/2020   PLT 125 (A) 09/26/2021   Continue to monitor, on  asa  8. Slow transit constipation Controlled with miralax   Family/ staff Communication: discussed with nurse, Ms. Szuch, and her daughter   Labs/tests ordered:  NA

## 2021-12-02 ENCOUNTER — Ambulatory Visit (INDEPENDENT_AMBULATORY_CARE_PROVIDER_SITE_OTHER): Payer: Medicare Other

## 2021-12-02 DIAGNOSIS — I442 Atrioventricular block, complete: Secondary | ICD-10-CM | POA: Diagnosis not present

## 2021-12-02 LAB — CUP PACEART REMOTE DEVICE CHECK
Battery Remaining Longevity: 117 mo
Battery Remaining Percentage: 85 %
Battery Voltage: 3.02 V
Brady Statistic RV Percent Paced: 8.2 %
Date Time Interrogation Session: 20230127020014
Implantable Lead Implant Date: 20200720
Implantable Lead Location: 753860
Implantable Pulse Generator Implant Date: 20200720
Lead Channel Impedance Value: 530 Ohm
Lead Channel Pacing Threshold Amplitude: 1 V
Lead Channel Pacing Threshold Pulse Width: 0.5 ms
Lead Channel Sensing Intrinsic Amplitude: 5 mV
Lead Channel Setting Pacing Amplitude: 2.5 V
Lead Channel Setting Pacing Pulse Width: 0.5 ms
Lead Channel Setting Sensing Sensitivity: 2 mV
Pulse Gen Model: 1272
Pulse Gen Serial Number: 9122450

## 2021-12-02 MED ORDER — METHOCARBAMOL 500 MG PO TABS: 250.0000 mg | ORAL_TABLET | Freq: Two times a day (BID) | ORAL | 0 refills | Status: DC | PRN

## 2021-12-02 MED ORDER — AZELASTINE HCL 0.1 % NA SOLN: 2.0000 | Freq: Two times a day (BID) | NASAL | 12 refills | Status: DC

## 2021-12-08 DIAGNOSIS — M545 Low back pain, unspecified: Secondary | ICD-10-CM | POA: Diagnosis not present

## 2021-12-09 ENCOUNTER — Telehealth: Payer: Self-pay | Admitting: Adult Health

## 2021-12-09 NOTE — Telephone Encounter (Signed)
Susan [place] was seen previously for low back pain and left sided pain. She had been using tylenol, robaxin, and voltaren with little benefit. She does not have any red flag symptoms. A lumbar/sacral xray was ordered on 2/2 which showed T 12 compression fracture age indeterminate and L1 burst fracture age indeterminate. In review of her chart she had a CT of the pelvix in 2021 which did show an L1 fracture. She has a hx of ostepenia and hx of pelvic fracture. She is mostly sedentary but does to walks with assistance. I discussed her case with Dr. Lyndel Safe and Susan [place] herself. Her pain is still present but she is able to manage. She is at risk for s/e from opiates. She is on ultram and she can use this as needed. The nurse thinks she leans to left due to her prior CVA and this may contribute to her pain. We agreed to offer her therapy for positioning if she has not already worked with them on this issue. If not improving or worsening will send to ortho.

## 2021-12-12 ENCOUNTER — Non-Acute Institutional Stay (SKILLED_NURSING_FACILITY): Payer: Medicare Other | Admitting: Adult Health

## 2021-12-12 ENCOUNTER — Other Ambulatory Visit: Payer: Self-pay | Admitting: *Deleted

## 2021-12-12 DIAGNOSIS — S32001A Stable burst fracture of unspecified lumbar vertebra, initial encounter for closed fracture: Secondary | ICD-10-CM

## 2021-12-12 DIAGNOSIS — S22000A Wedge compression fracture of unspecified thoracic vertebra, initial encounter for closed fracture: Secondary | ICD-10-CM | POA: Diagnosis not present

## 2021-12-12 DIAGNOSIS — M545 Low back pain, unspecified: Secondary | ICD-10-CM | POA: Diagnosis not present

## 2021-12-12 MED ORDER — TRAMADOL HCL 50 MG PO TABS: 50.0000 mg | ORAL_TABLET | Freq: Four times a day (QID) | ORAL | 0 refills | Status: DC | PRN

## 2021-12-12 NOTE — Telephone Encounter (Signed)
Received fax from [facility].  Pended Rx and sent to [facility] for approval.

## 2021-12-12 NOTE — Progress Notes (Signed)
Remote pacemaker transmission.   

## 2021-12-13 ENCOUNTER — Encounter: Payer: Self-pay | Admitting: Adult Health

## 2021-12-13 NOTE — Progress Notes (Signed)
Location:  Occupational psychologist of Service:  SNF (31) Provider:   Cindi Carbon, [place] [phone]   [name], MD  Patient Care Team: [name], MD as PCP - General (Internal Medicine) [name], MD as PCP - Electrophysiology (Cardiology) [name], MD as Consulting Physician (Dermatology) [name], MD as Consulting Physician (Cardiology) [facility] [name], MD as Consulting Physician (Dermatology)  Extended Emergency Contact Information Primary Emergency Contact: [name] of [place] Phone: [phone] Mobile Phone: [phone] Relation: Daughter Secondary Emergency Contact: [name],[name]  [name] of [place] Phone: [phone] Relation: Son  Code Status:  DNR Goals of care: Advanced Directive information Advanced Directives 12/01/2021  Does Patient Have a Medical Advance Directive? Yes  Type of Paramedic of [place];Living will;Out of facility DNR (pink MOST or yellow form)  Does patient want to make changes to medical advance directive? No - Patient declined  Copy of [place] in Chart? Yes - validated most recent copy scanned in chart (See row information)  Pre-existing out of facility DNR order (yellow form or pink MOST form) Yellow form placed in chart (order not valid for inpatient use);Pink MOST form placed in chart (order not valid for inpatient use)     Chief Complaint  Patient presents with   Acute Visit    Back pain    HPI:  Pt is a 88 y.o. female seen today for an acute visit for f/u regarding low back pain.   She has had low back pain and on the left side for a couple of weeks. She is using tylenol and voltaren gel. Reluctant to use ultram but agreed to try. It does not radiate down her legs. No change in B/B habits. Has a hx of OP, compression fx, DDD. She rested in bed most of  the weekend not because her back was hurting but because she wanted to rest her spine before work with therapy and getting up this week. She does not feel its worse. She does not want try norco.  A lumbar/sacral xray was ordered on 2/2 which showed T 12 compression fracture age indeterminate and L1 burst fracture age indeterminate Past Medical History:  Diagnosis Date   Actinic keratosis 05/25/2014   Anxiety    Atrial fibrillation ([place]) 09/21/2010   Basal cell carcinoma 05/25/2014   Multiple removed by Dr. Danella Sensing in March 2015: right neck, left neck, scalp    Bell's palsy 07/18/1979   Cervicalgia 01/31/2012   Closed fracture of lumbar vertebra without mention of spinal cord injury 07/17/2005   Conjunctiva disorder 12/26/2010   Coronary atherosclerosis of native coronary artery 07/17/1997   Cramp of limb 08/16/2009   Degeneration of lumbar or lumbosacral intervertebral disc 01/31/2012   Disturbance of skin sensation 08/2009   Dizziness and giddiness 11/08/2011   vertigo   External hemorrhoids without mention of complication 1/82/9937   External hemorrhoids without mention of complication 16/96/7893   Ganglion of tendon sheath 08/16/2009   Leg cramp 10/27/2013   Most frequently the right leg.    Long term (current) use of anticoagulants 09/2010   Lumbago 07/2009   Meralgia paresthetica 07/2007   MI, old    Myalgia and myositis, unspecified 01/17/2012   Osteoporosis    Other abnormal blood chemistry 04/08/2012   Other abnormal blood chemistry 2013   hyperglycemia   Other disorder of muscle, ligament, and fascia 04/08/2012  Other specified cardiac dysrhythmias(427.89) 06/27/2010   Pacemaker 05/06/2020   Pain in joint, ankle and foot 10/27/2013   Bilateral since 1998    Pain in joint, shoulder region 08/12/2012   Pain in joint, upper arm 12/26/2010   Pain in limb 01/11/2011   Pain in thoracic spine 01/31/2012   Pathologic fracture of vertebrae 01/22/2012   PVC's (premature ventricular  contractions)    Rash and other nonspecific skin eruption 08/02/2011   Senile osteoporosis 07/17/1993   Stroke ([place])    Unspecified essential hypertension 07/17/1997   Varicose veins of lower extremities 08/16/2009   Varicose veins of lower extremities 08/16/2009   Past Surgical History:  Procedure Laterality Date   COLONOSCOPY WITH PROPOFOL N/A 10/19/2015   Procedure: COLONOSCOPY WITH PROPOFOL;  Surgeon: Gatha Mayer, MD;  Location: [facility];  Service: Endoscopy;  Laterality: N/A;   CORONARY ARTERY BYPASS GRAFT  1998   x2; LIMA to LAD; SVG to diagonal off bypass   HEMORRHOID SURGERY  08/26/2012   Dr. Brantley Stage   LOOP RECORDER INSERTION N/A 05/13/2018   Procedure: LOOP RECORDER INSERTION;  Surgeon: [name], MD;  Location: [facility];  Service: Cardiovascular;  Laterality: N/A;   LOOP RECORDER REMOVAL N/A 05/26/2019   Procedure: LOOP RECORDER REMOVAL;  Surgeon: [name], MD;  Location: [facility];  Service: Cardiovascular;  Laterality: N/A;   MASS EXCISION Left 11/23/2015   Procedure: LEFT WRIST EXCISION CYST;  Surgeon: Leanora Cover, MD;  Location: [facility];  Service: Orthopedics;  Laterality: Left;   PACEMAKER IMPLANT N/A 05/26/2019   Procedure: PACEMAKER IMPLANT;  Surgeon: [name], MD;  Location: [facility];  Service: Cardiovascular;  Laterality: N/A;   SKIN BIOPSY  01/29/14   (R) neck; (R) scalp, 2 (L) neck; shave biopsy Superficial basal cell carcinoma Dr. Danella Sensing   TONSILLECTOMY  [phone]    Allergies  Allergen Reactions   Iodinated Contrast Media Nausea Only and Other (See Comments)    Severe nausea and also passed out   Iodine    Other     Seasonal Allergies.   Bactrim Rash   Relafen [Nabumetone] Rash   Sulfanilamide Rash    Outpatient Encounter Medications as of 12/12/2021  Medication Sig   acetaminophen (TYLENOL) 500 MG tablet Take 1,000 mg by mouth every 6 (six) hours as needed for mild pain (pain.).     amLODipine (NORVASC) 5 MG tablet Take 5 mg by mouth daily.   aspirin EC 81 MG EC tablet Take 1 tablet (81 mg total) by mouth daily. Swallow whole.   atenolol (TENORMIN) 50 MG tablet Take 50 mg by mouth 2 (two) times daily. HOLD FOR SPB <90 or HR< 60   azelastine (ASTELIN) 0.1 % nasal spray Place 2 sprays into both nostrils 2 (two) times daily. Use in each nostril as directed   calcium-vitamin D (OSCAL WITH D) 500-200 MG-UNIT tablet Take 1 tablet by mouth daily.   diclofenac Sodium (VOLTAREN) 1 % GEL Apply 4 g topically 4 (four) times daily as needed. Left knee, right knee, left ankle   digoxin (LANOXIN) 0.125 MG tablet Take 0.125 mg by mouth every other day.   fluticasone (FLONASE) 50 MCG/ACT nasal spray Place 1 spray into both nostrils daily.   hydrALAZINE (APRESOLINE) 10 MG tablet Take 10 mg by mouth 3 (three) times daily as needed. TID PRN for SBP >170   hydrochlorothiazide (HYDRODIURIL) 50 MG tablet Take 50 mg by mouth daily.  loratadine (CLARITIN) 10 MG tablet Take 10 mg by mouth at bedtime.   LORazepam (ATIVAN) 1 MG tablet Take 1 tablet (1 mg total) by mouth at bedtime.   methocarbamol (ROBAXIN) 500 MG tablet Take 0.5 tablets (250 mg total) by mouth 2 (two) times daily as needed for muscle spasms.   ondansetron (ZOFRAN) 4 MG tablet Take 1 tablet (4 mg total) by mouth every 6 (six) hours as needed for nausea.   polyethylene glycol (MIRALAX / GLYCOLAX) 17 g packet Take 17 g by mouth every other day.   potassium chloride SA (KLOR-CON) 20 MEQ tablet Take 20 mEq by mouth daily.   [DISCONTINUED] traMADol (ULTRAM) 50 MG tablet Take 1 tablet (50 mg total) by mouth every 6 (six) hours as needed for moderate pain or severe pain.   No facility-administered encounter medications on file as of 12/12/2021.    Review of Systems  Constitutional:  Negative for activity change, appetite change, chills, diaphoresis, fatigue, fever and unexpected weight change.  HENT:  Positive for congestion.    Respiratory:  Negative for cough, shortness of breath and wheezing.   Cardiovascular:  Negative for chest pain, palpitations and leg swelling.  Gastrointestinal:  Negative for abdominal distention, abdominal pain, constipation and diarrhea.  Genitourinary:  Negative for difficulty urinating and dysuria.  Musculoskeletal:  Positive for back pain and gait problem. Negative for arthralgias, joint swelling and myalgias.  Neurological:  Positive for weakness. Negative for dizziness, tremors, seizures, syncope, facial asymmetry, speech difficulty, light-headedness, numbness and headaches.  Psychiatric/Behavioral:  Negative for agitation, behavioral problems and confusion.    Immunization History  Administered Date(s) Administered   Influenza Whole 08/06/2012   Influenza, High Dose Seasonal PF 08/15/2019, 08/27/2020   Influenza,inj,Quad PF,6+ Mos 08/30/2018   Influenza-Unspecified 08/06/2013, 08/24/2014, 08/26/2015, 08/31/2016, 08/27/2017, 08/27/2020   Moderna Sars-Covid-2 Vaccination 11/16/2019, 12/16/2019, 09/16/2020   Pneumococcal Conjugate-13 09/22/2015   Pneumococcal Polysaccharide-23 08/29/2006   Td 10/07/2007   Zoster Recombinat (Shingrix) 02/12/2018, 05/13/2018   Zoster, Live 03/12/2008   Pertinent  Health Maintenance Due  Topic Date Due   INFLUENZA VACCINE  02/03/2022 (Originally 06/06/2021)   DEXA SCAN  Completed   Fall Risk 08/23/2020 08/23/2020 08/26/2020 09/23/2020 09/27/2021  Falls in the past year? - - 1 1 0  Number of falls in past year - - - - -  Was there an injury with Fall? - - 1 1 0  Was there an injury with Fall? - - - Patient fractured pelvis and had a stroke -  Fall Risk Category Calculator - - 2 2 0  Fall Risk Category - - Moderate Moderate Low  Patient Fall Risk Level High fall risk High fall risk - Moderate fall risk Low fall risk  Patient at Risk for Falls Due to - - History of fall(s);Impaired balance/gait;Impaired mobility;Orthopedic patient - No Fall Risks   Fall risk Follow up - - Falls evaluation completed;Education provided;Falls prevention discussed;Follow up appointment - Falls evaluation completed   Functional Status Survey:    Vitals:   12/13/21 0751  BP: (!) 153/89  Pulse: 76  Resp: 20  Temp: (!) 97 F (36.1 C)  SpO2: 96%   There is no height or weight on file to calculate BMI. Physical Exam Vitals reviewed.  Constitutional:      Appearance: Normal appearance.  Musculoskeletal:        General: No swelling, tenderness, deformity or signs of injury.     Comments: Neg slr  Neurological:     Mental Status: She  is alert.    Labs reviewed: Recent Labs    08/05/21 0000 08/16/21 0000 09/26/21 0000  NA 136* 133* 141  K 3.6 4.4 3.8  CL 96* 92* 100  CO2 31* 29* 29*  BUN 26* 18 25*  CREATININE 1.1 0.8 1.0  CALCIUM 9.1 8.9 9.4   Recent Labs    08/16/21 0000 09/26/21 0000  AST 30 15  ALT 23 8  ALKPHOS 87 90  ALBUMIN 3.1* 3.6   Recent Labs    05/20/21 0000 08/16/21 0000 09/26/21 0000  WBC 5.7 11.0 6.7  HGB 12.7 10.8* 11.5*  HCT 37 32* 36  PLT 121* 165 125*   Lab Results  Component Value Date   TSH 2.48 03/22/2021   Lab Results  Component Value Date   HGBA1C 5.7 (H) 08/19/2020   Lab Results  Component Value Date   CHOL 180 09/26/2021   HDL 44 09/26/2021   LDLCALC 122 09/26/2021   LDLDIRECT 81.3 08/19/2020   TRIG 70 09/26/2021   CHOLHDL 2 09/12/2010    Significant Diagnostic Results in last 30 days:  CUP PACEART REMOTE DEVICE CHECK  Result Date: 12/02/2021 Scheduled remote reviewed. Normal device function.  Next remote 91 days. LA   Assessment/Plan 1. Acute left-sided low back pain without sciatica Likely multifactorial due to osteoporosis, degenerative changes and possible issues with positioning in chair She is going to work with OT Add lidocaine patch Continue prn tylenol and ultram and robaxin If not improving or worsening will refer to ortho.   2. Compression fracture of body of  thoracic vertebra (HCC) Prior hx of OP Takes Ca with Vit d Not on meds due to minimal weight bearing status and skilled care status.   3. Closed burst fracture of lumbar vertebra, initial encounter [facility]) She has had findings of osteoporosis and compression fracture of L1 on prior CT and xray imaging. So this is not new.     Family/ staff Communication: discussed with Ms. Poynter and her nurse Karl Luke over her care with Dr. Lyndel Safe  Labs/tests ordered:  na

## 2021-12-15 ENCOUNTER — Non-Acute Institutional Stay (SKILLED_NURSING_FACILITY): Payer: Medicare Other | Admitting: Adult Health

## 2021-12-15 ENCOUNTER — Encounter: Payer: Self-pay | Admitting: Adult Health

## 2021-12-15 DIAGNOSIS — T148XXA Other injury of unspecified body region, initial encounter: Secondary | ICD-10-CM | POA: Diagnosis not present

## 2021-12-15 DIAGNOSIS — M545 Low back pain, unspecified: Secondary | ICD-10-CM | POA: Diagnosis not present

## 2021-12-15 NOTE — Progress Notes (Addendum)
Location:  Occupational psychologist of Service:  SNF (31) Provider:   Cindi Carbon, [place] [phone]   [name], MD  Patient Care Team: [name], MD as PCP - General (Internal Medicine) [name], MD as PCP - Electrophysiology (Cardiology) [name], MD as Consulting Physician (Dermatology) [name], MD as Consulting Physician (Cardiology) [facility] [name], MD as Consulting Physician (Dermatology)  Extended Emergency Contact Information Primary Emergency Contact: [name] of [place] Phone: [phone] Mobile Phone: [phone] Relation: Daughter Secondary Emergency Contact: [name],[name]  [name] of [place] Phone: [phone] Relation: Son  Code Status:  DNR Goals of care: Advanced Directive information Advanced Directives 12/01/2021  Does Patient Have a Medical Advance Directive? Yes  Type of Paramedic of [place];Living will;Out of facility DNR (pink MOST or yellow form)  Does patient want to make changes to medical advance directive? No - Patient declined  Copy of [place] in Chart? Yes - validated most recent copy scanned in chart (See row information)  Pre-existing out of facility DNR order (yellow form or pink MOST form) Yellow form placed in chart (order not valid for inpatient use);Pink MOST form placed in chart (order not valid for inpatient use)     Chief Complaint  Patient presents with   Acute Visit    Low back pain    HPI:  Pt is a 88 y.o. female seen today for an acute visit for follow up regarding back pain.   She has had low back pain and on the left side for 2-3 weeks.  She is using ultram, robaxin, and voltaren sparingly for pain. Has not tried lidoderm yet.  It does not radiate down her legs. No change in B/B habits. Has a hx of OP, compression fx, DDD  A  lumbar/sacral xray was ordered on 2/2 which showed T 12 compression fracture age indeterminate and L1 burst fracture age indeterminate. Her daughter is present for the visit. Reviewed xray findings which are not new.   Ms. Jorstad expressed that she is satisfied with her current pain regimen.   Therapy suggested she get in a different position to allow for extension of her spine.   Also she has a bump on her left leg that is tender that she wants me to look at. No report of injury. Does have hx of easy bruising.   Past Medical History:  Diagnosis Date   Actinic keratosis 05/25/2014   Anxiety    Atrial fibrillation ([place]) 09/21/2010   Basal cell carcinoma 05/25/2014   Multiple removed by Dr. Danella Sensing in March 2015: right neck, left neck, scalp    Bell's palsy 07/18/1979   Cervicalgia 01/31/2012   Closed fracture of lumbar vertebra without mention of spinal cord injury 07/17/2005   Conjunctiva disorder 12/26/2010   Coronary atherosclerosis of native coronary artery 07/17/1997   Cramp of limb 08/16/2009   Degeneration of lumbar or lumbosacral intervertebral disc 01/31/2012   Disturbance of skin sensation 08/2009   Dizziness and giddiness 11/08/2011   vertigo   External hemorrhoids without mention of complication 2/54/2706   External hemorrhoids without mention of complication 23/76/2831   Ganglion of tendon sheath 08/16/2009   Leg cramp 10/27/2013   Most frequently the right leg.    Long term (current) use of anticoagulants 09/2010   Lumbago 07/2009   Meralgia paresthetica 07/2007   MI, old  Myalgia and myositis, unspecified 01/17/2012   Osteoporosis    Other abnormal blood chemistry 04/08/2012   Other abnormal blood chemistry 2013   hyperglycemia   Other disorder of muscle, ligament, and fascia 04/08/2012   Other specified cardiac dysrhythmias(427.89) 06/27/2010   Pacemaker 05/06/2020   Pain in joint, ankle and foot 10/27/2013   Bilateral since 1998    Pain in joint, shoulder region  08/12/2012   Pain in joint, upper arm 12/26/2010   Pain in limb 01/11/2011   Pain in thoracic spine 01/31/2012   Pathologic fracture of vertebrae 01/22/2012   PVC's (premature ventricular contractions)    Rash and other nonspecific skin eruption 08/02/2011   Senile osteoporosis 07/17/1993   Stroke ([place])    Unspecified essential hypertension 07/17/1997   Varicose veins of lower extremities 08/16/2009   Varicose veins of lower extremities 08/16/2009   Past Surgical History:  Procedure Laterality Date   COLONOSCOPY WITH PROPOFOL N/A 10/19/2015   Procedure: COLONOSCOPY WITH PROPOFOL;  Surgeon: Gatha Mayer, MD;  Location: [facility];  Service: Endoscopy;  Laterality: N/A;   CORONARY ARTERY BYPASS GRAFT  1998   x2; LIMA to LAD; SVG to diagonal off bypass   HEMORRHOID SURGERY  08/26/2012   Dr. Brantley Stage   LOOP RECORDER INSERTION N/A 05/13/2018   Procedure: LOOP RECORDER INSERTION;  Surgeon: [name], MD;  Location: [place] [facility];  Service: Cardiovascular;  Laterality: N/A;   LOOP RECORDER REMOVAL N/A 05/26/2019   Procedure: LOOP RECORDER REMOVAL;  Surgeon: [name], MD;  Location: [place] [facility];  Service: Cardiovascular;  Laterality: N/A;   MASS EXCISION Left 11/23/2015   Procedure: LEFT WRIST EXCISION CYST;  Surgeon: Leanora Cover, MD;  Location: [facility];  Service: Orthopedics;  Laterality: Left;   PACEMAKER IMPLANT N/A 05/26/2019   Procedure: PACEMAKER IMPLANT;  Surgeon: [name], MD;  Location: [facility];  Service: Cardiovascular;  Laterality: N/A;   SKIN BIOPSY  01/29/14   (R) neck; (R) scalp, 2 (L) neck; shave biopsy Superficial basal cell carcinoma Dr. Danella Sensing   TONSILLECTOMY  [phone]    Allergies  Allergen Reactions   Iodinated Contrast Media Nausea Only and Other (See Comments)    Severe nausea and also passed out   Iodine    Other     Seasonal Allergies.   Bactrim Rash   Relafen [Nabumetone] Rash   Sulfanilamide Rash     Outpatient Encounter Medications as of 12/15/2021  Medication Sig   acetaminophen (TYLENOL) 500 MG tablet Take 1,000 mg by mouth every 6 (six) hours as needed for mild pain (pain.).    amLODipine (NORVASC) 5 MG tablet Take 5 mg by mouth daily.   aspirin EC 81 MG EC tablet Take 1 tablet (81 mg total) by mouth daily. Swallow whole.   atenolol (TENORMIN) 50 MG tablet Take 50 mg by mouth 2 (two) times daily. HOLD FOR SPB <90 or HR< 60   azelastine (ASTELIN) 0.1 % nasal spray Place 2 sprays into both nostrils 2 (two) times daily. Use in each nostril as directed   calcium-vitamin D (OSCAL WITH D) 500-200 MG-UNIT tablet Take 1 tablet by mouth daily.   diclofenac Sodium (VOLTAREN) 1 % GEL Apply 4 g topically 4 (four) times daily as needed. Left knee, right knee, left ankle   digoxin (LANOXIN) 0.125 MG tablet Take 0.125 mg by mouth every other day.   fluticasone (FLONASE) 50 MCG/ACT nasal spray Place 1 spray into both  nostrils daily.   hydrALAZINE (APRESOLINE) 10 MG tablet Take 10 mg by mouth 3 (three) times daily as needed. TID PRN for SBP >170   hydrochlorothiazide (HYDRODIURIL) 50 MG tablet Take 50 mg by mouth daily.   lidocaine (LIDODERM) 5 % Place 1 patch onto the skin daily as needed. Remove & Discard patch within 12 hours or as directed by MD   loratadine (CLARITIN) 10 MG tablet Take 10 mg by mouth at bedtime.   LORazepam (ATIVAN) 1 MG tablet Take 1 tablet (1 mg total) by mouth at bedtime.   methocarbamol (ROBAXIN) 500 MG tablet Take 0.5 tablets (250 mg total) by mouth 2 (two) times daily as needed for muscle spasms.   ondansetron (ZOFRAN) 4 MG tablet Take 1 tablet (4 mg total) by mouth every 6 (six) hours as needed for nausea.   polyethylene glycol (MIRALAX / GLYCOLAX) 17 g packet Take 17 g by mouth every other day.   potassium chloride SA (KLOR-CON) 20 MEQ tablet Take 20 mEq by mouth daily.   traMADol (ULTRAM) 50 MG tablet Take 1 tablet (50 mg total) by mouth every 6 (six) hours as needed for  moderate pain or severe pain.   No facility-administered encounter medications on file as of 12/15/2021.    Review of Systems  Constitutional:  Positive for activity change. Negative for appetite change, chills, diaphoresis, fatigue, fever and unexpected weight change.  Cardiovascular:  Negative for chest pain and leg swelling.  Gastrointestinal:  Negative for abdominal distention, constipation and nausea.  Genitourinary:  Negative for decreased urine volume, difficulty urinating, dyspareunia and urgency.  Musculoskeletal:  Positive for back pain and gait problem.  Skin:  Positive for color change.  Neurological:  Negative for numbness.  Psychiatric/Behavioral:  Negative for confusion.    Immunization History  Administered Date(s) Administered   Influenza Whole 08/06/2012   Influenza, High Dose Seasonal PF 08/15/2019, 08/27/2020   Influenza,inj,Quad PF,6+ Mos 08/30/2018   Influenza-Unspecified 08/06/2013, 08/24/2014, 08/26/2015, 08/31/2016, 08/27/2017, 08/27/2020   Moderna Sars-Covid-2 Vaccination 11/16/2019, 12/16/2019, 09/16/2020   Pneumococcal Conjugate-13 09/22/2015   Pneumococcal Polysaccharide-23 08/29/2006   Td 10/07/2007   Zoster Recombinat (Shingrix) 02/12/2018, 05/13/2018   Zoster, Live 03/12/2008   Pertinent  Health Maintenance Due  Topic Date Due   INFLUENZA VACCINE  02/03/2022 (Originally 06/06/2021)   DEXA SCAN  Completed   Fall Risk 08/23/2020 08/23/2020 08/26/2020 09/23/2020 09/27/2021  Falls in the past year? - - 1 1 0  Number of falls in past year - - - - -  Was there an injury with Fall? - - 1 1 0  Was there an injury with Fall? - - - Patient fractured pelvis and had a stroke -  Fall Risk Category Calculator - - 2 2 0  Fall Risk Category - - Moderate Moderate Low  Patient Fall Risk Level High fall risk High fall risk - Moderate fall risk Low fall risk  Patient at Risk for Falls Due to - - History of fall(s);Impaired balance/gait;Impaired mobility;Orthopedic  patient - No Fall Risks  Fall risk Follow up - - Falls evaluation completed;Education provided;Falls prevention discussed;Follow up appointment - Falls evaluation completed   Functional Status Survey:    Vitals:   12/15/21 1504  BP: (!) 162/64   There is no height or weight on file to calculate BMI. Physical Exam Vitals and nursing note reviewed.  Cardiovascular:     Rate and Rhythm: Normal rate. Rhythm irregular.  Pulmonary:     Effort: Pulmonary effort is normal.  Breath sounds: Normal breath sounds.  Musculoskeletal:        General: Deformity (kyphotic) present. No swelling, tenderness or signs of injury.     Right lower leg: No edema.     Left lower leg: No edema.     Comments: Neg SLR. NO pain on palpation of the spine. NO pain with active spinal ROM.  Skin:    General: Skin is warm and dry.     Findings: Bruising (TO BLE. Left inferior calf with small hematoma, tender to touch. No redness or warmth.) present.  Psychiatric:        Mood and Affect: Mood normal.    Labs reviewed: Recent Labs    08/05/21 0000 08/16/21 0000 09/26/21 0000  NA 136* 133* 141  K 3.6 4.4 3.8  CL 96* 92* 100  CO2 31* 29* 29*  BUN 26* 18 25*  CREATININE 1.1 0.8 1.0  CALCIUM 9.1 8.9 9.4   Recent Labs    08/16/21 0000 09/26/21 0000  AST 30 15  ALT 23 8  ALKPHOS 87 90  ALBUMIN 3.1* 3.6   Recent Labs    05/20/21 0000 08/16/21 0000 09/26/21 0000  WBC 5.7 11.0 6.7  HGB 12.7 10.8* 11.5*  HCT 37 32* 36  PLT 121* 165 125*   Lab Results  Component Value Date   TSH 2.48 03/22/2021   Lab Results  Component Value Date   HGBA1C 5.7 (H) 08/19/2020   Lab Results  Component Value Date   CHOL 180 09/26/2021   HDL 44 09/26/2021   LDLCALC 122 09/26/2021   LDLDIRECT 81.3 08/19/2020   TRIG 70 09/26/2021   CHOLHDL 2 09/12/2010    Significant Diagnostic Results in last 30 days:  CUP PACEART REMOTE DEVICE CHECK  Result Date: 12/02/2021 Scheduled remote reviewed. Normal device  function.  Next remote 91 days. LA   Assessment/Plan 1. Acute left-sided low back pain without sciatica She feels her pain, while still present, is controlled with the current regimen which includes prn ultram, robaxin, lidoderm, voltaren, and tylenol. Her daughter is also providing wintergreen cream. She uses the above regimen sparingly. I encouraged her to not wait until the pain is severe to ask for something for pain. She does not want to schedule any other meds for pain at this time. We discussed her care with therapy and they feel she is in the right chair which is supportive. She has the proper cushion in her chair as well. She is going to try getting into a recliner in the afternoon to provide a change of position and extension of the spine.   2. Hematoma Small noted to LLE Unclear etiology Ask staff to monitor for worsening size, redness, or warmth.     Family/ staff Communication: discussed with her daughter [name] and the resident.   Labs/tests ordered:  NA

## 2021-12-22 ENCOUNTER — Ambulatory Visit (INDEPENDENT_AMBULATORY_CARE_PROVIDER_SITE_OTHER): Payer: Medicare Other | Admitting: Podiatry

## 2021-12-22 ENCOUNTER — Telehealth: Payer: Self-pay

## 2021-12-22 ENCOUNTER — Other Ambulatory Visit: Payer: Self-pay

## 2021-12-22 ENCOUNTER — Ambulatory Visit: Payer: Medicare Other

## 2021-12-22 DIAGNOSIS — M205X1 Other deformities of toe(s) (acquired), right foot: Secondary | ICD-10-CM | POA: Diagnosis not present

## 2021-12-22 DIAGNOSIS — B351 Tinea unguium: Secondary | ICD-10-CM | POA: Diagnosis not present

## 2021-12-22 DIAGNOSIS — M79675 Pain in left toe(s): Secondary | ICD-10-CM

## 2021-12-22 DIAGNOSIS — L84 Corns and callosities: Secondary | ICD-10-CM | POA: Diagnosis not present

## 2021-12-22 DIAGNOSIS — M79674 Pain in right toe(s): Secondary | ICD-10-CM | POA: Diagnosis not present

## 2021-12-22 NOTE — Progress Notes (Signed)
SITUATION Reason for Consult: Evaluation for Prefabricated Orthopedic Shoes. Patient / Caregiver Report: Patient would like well fitting shoes  OBJECTIVE DATA: Patient History / Diagnosis:    ICD-10-CM   1. 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 Pre-ulcerative calluses  L84       Current or Previous Devices:   None and no history  In-Person Foot Examination: Ulcers & Callousing:   Calluses on 5th met heads bilateral  Toe / Foot Deformities:   - Adductovarus rotation of 5th toes   Shoe Size: 41M  ORTHOTIC RECOMMENDATION Recommended Devices: - 1x pair prefabricated PDAC approved Orthopedic shoes: Patient Selects Orthofeet 843 41M  GOALS OF SHOES - Reduce shear and pressure - Reduce / Prevent callus formation - Reduce / Prevent ulceration  ACTIONS PERFORMED Patient was measured for shoes via brannock device. Procedure was explained and patient tolerated procedure well. All questions were answered and concerns addressed.  PLAN Once shoes are received patient is to be scheduled for fitting.

## 2021-12-22 NOTE — Telephone Encounter (Signed)
Shoes Ordered - Orthofeet 843 66M

## 2021-12-25 NOTE — Progress Notes (Signed)
Subjective: 88 year old female presents the office today for concerns of nail fungus and also nails becoming thick and elongated she is not able to trim the herself.  She also has a corn on her right fifth toe causing discomfort.  She is not able to keep the offloading pads on as there is no written order for facility to put them on.  She was using tea tree oil on the nails but has not used it recently.  No swelling or redness to the toenail sites and no other concerns today.  Objective: AAO x3, NAD-daughter present DP/PT pulses palpable bilaterally, CRT less than 3 seconds The nails are hypertrophic, dystrophic with yellow-brown discoloration with the hallux toenails the most affected.  There is no edema, erythema or signs of infection.  The nails do cause tenderness 1-5 bilaterally.   There is a hyperkeratotic lesion on the right fifth toe which causes discomfort.  There is no underlying ulceration drainage or any obvious signs of infection.  Adductovarus present of the digit. No pain with calf compression, swelling, warmth, erythema  Assessment: Symptomatic onychomycosis, hyperkeratotic lesion  Plan: -All treatment options discussed with the patient including all alternatives, risks, complications.  -I sharply debrided the nails x10 without any complications or bleeding.  -Sharply debrided the hyperkeratotic lesion x1 without any complications or bleeding as a courtesy.  Offloading padding avoid shoes that put excess pressure.  Order was written to put this on during the day and take them off at nighttime to avoid pressure to help with the corn. -Her daughter wants her to get shoes from our office.  She was seen by our orthotist, Aaron Edelman for this. -Patient encouraged to call the office with any questions, concerns, change in symptoms.   Trula Slade DPM

## 2022-01-02 DIAGNOSIS — Z961 Presence of intraocular lens: Secondary | ICD-10-CM | POA: Diagnosis not present

## 2022-01-02 DIAGNOSIS — H3500 Unspecified background retinopathy: Secondary | ICD-10-CM | POA: Diagnosis not present

## 2022-01-02 DIAGNOSIS — H52203 Unspecified astigmatism, bilateral: Secondary | ICD-10-CM | POA: Diagnosis not present

## 2022-01-03 ENCOUNTER — Encounter: Payer: Self-pay | Admitting: Orthopedic Surgery

## 2022-01-03 ENCOUNTER — Non-Acute Institutional Stay (SKILLED_NURSING_FACILITY): Payer: Medicare Other | Admitting: Orthopedic Surgery

## 2022-01-03 DIAGNOSIS — I4821 Permanent atrial fibrillation: Secondary | ICD-10-CM

## 2022-01-03 DIAGNOSIS — M545 Low back pain, unspecified: Secondary | ICD-10-CM

## 2022-01-03 DIAGNOSIS — J302 Other seasonal allergic rhinitis: Secondary | ICD-10-CM | POA: Diagnosis not present

## 2022-01-03 DIAGNOSIS — D696 Thrombocytopenia, unspecified: Secondary | ICD-10-CM

## 2022-01-03 DIAGNOSIS — K5901 Slow transit constipation: Secondary | ICD-10-CM

## 2022-01-03 DIAGNOSIS — I10 Essential (primary) hypertension: Secondary | ICD-10-CM | POA: Diagnosis not present

## 2022-01-03 DIAGNOSIS — I442 Atrioventricular block, complete: Secondary | ICD-10-CM | POA: Diagnosis not present

## 2022-01-03 DIAGNOSIS — I69354 Hemiplegia and hemiparesis following cerebral infarction affecting left non-dominant side: Secondary | ICD-10-CM

## 2022-01-03 NOTE — Progress Notes (Signed)
°  Subjective:     Patient ID: Susan Conway, female   DOB: 09/03/1929, 88 y.o.   MRN: [id number]  HPI  Pt seen today for routine visit. Resides on SNF unit at [facility].  Recently seen for lower back pain. X-ray ordered on 2/2 showed T12 compression fracture. Pt was started on lidocaine patches PRN but was unaware, so has yet to use them. States that pain is mild today and that she has been reclining in the big wheelchair for about an hour as opposed to remaining in her power wheelchair and has seen improvement in her pain. She also walks with PT a couple of days a week.   Pt has hx of CVA with residuals of dysarthria, dysphagia, and left sided weakness. Denies difficulty with swallowing and has had no recent aspiration events.     Review of Systems  Constitutional:  Negative for activity change and fever.  HENT:  Negative for dental problem and trouble swallowing.   Eyes:  Positive for visual disturbance.  Respiratory:  Negative for shortness of breath and wheezing.   Cardiovascular:  Negative for chest pain and leg swelling.  Gastrointestinal:  Negative for abdominal pain.  Genitourinary:  Negative for dysuria.  Musculoskeletal:  Positive for back pain and gait problem.  Allergic/Immunologic: Positive for environmental allergies.  Neurological:  Positive for weakness.  Psychiatric/Behavioral:  Negative for agitation, behavioral problems and confusion.       Objective:   Physical Exam Constitutional:      General: She is not in acute distress. HENT:     Head:     Comments: Bilateral hearing aides Eyes:     Conjunctiva/sclera: Conjunctivae normal.  Cardiovascular:     Rate and Rhythm: Rhythm irregular.     Pulses: Normal pulses.     Heart sounds: Normal heart sounds.  Pulmonary:     Effort: Pulmonary effort is normal.     Breath sounds: Normal breath sounds.  Abdominal:     Palpations: Abdomen is soft.     Tenderness: There is no abdominal tenderness.  Musculoskeletal:         General: No swelling.  Skin:    Findings: Bruising present.     Comments: Healing hematoma on left inferior calf, no erythema or warmth, TTP, light purple in color  Neurological:     Mental Status: She is alert and oriented to person, place, and time. Mental status is at baseline.     Motor: Weakness present.       Assessment/Plan:      1. 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  -continue SNF setting  2. Compression fracture of body of thoracic vertebra  -patient states she is satisfied with current pain regime but does still have pain.  -continue tramadol, robaxin, and tylenol -change Lidoderm patch from PRN to scheduled x1 week to see if pain is improved  3. Slow transit constipation -continue miralax  4. Essential hypertension - continue amlodipine, hydralazine, hydrochlorothiazide, and atenolol -controlled on current regime  5. Permanent atrial fibrillation  -Follows with Cardiologist Dr. Lovena Le, last seen 10/10/2021 -aspirin only for clot prevention d/t hx of bleeding on eliquis -rate controlled on digoxin  6. Seasonal allergies -continue flonase, azelastine, and claritin

## 2022-01-03 NOTE — Progress Notes (Signed)
Location:  [facility] Room Number: 118 Place of Service:  SNF [id number]) Provider:  Windell Moulding, AGNP-C  [name], MD  Patient Care Team: [name], MD as PCP - General (Internal Medicine) [name], MD as PCP - Electrophysiology (Cardiology) [name], MD as Consulting Physician (Dermatology) [name], MD as Consulting Physician (Cardiology) [name], MD as Consulting Physician (Dermatology)  Extended Emergency Contact Information Primary Emergency Contact: [name] of [place] Phone: [phone] Mobile Phone: [phone] Relation: Daughter Secondary Emergency Contact: [name],[name]  [name] of [place] Phone: [phone] Relation: Son  Code Status:  DNR Goals of care: Advanced Directive information Advanced Directives 12/01/2021  Does Patient Have a Medical Advance Directive? Yes  Type of Paramedic of [place];Living will;Out of facility DNR (pink MOST or yellow form)  Does patient want to make changes to medical advance directive? No - Patient declined  Copy of [place] in Chart? Yes - validated most recent copy scanned in chart (See row information)  Pre-existing out of facility DNR order (yellow form or pink MOST form) Yellow form placed in chart (order not valid for inpatient use);Pink MOST form placed in chart (order not valid for inpatient use)     Chief Complaint  Patient presents with   Medical Management of Chronic Issues    HPI:  Pt is a 88 y.o. female seen today for medical management of chronic diseases.    She currently resides on the skilled nursing unit at [facility]. Past medical history includes: atrial fibrillation, AV block, CAD, hypertension, GERD, stroke, left sided paralysis, constipation, and dysphagia.    Acute left sided low back pain- 02/02 spinal xray revealed T12  compression fracture, pain intermittent, no radiation, no changes to B/B, remains on tylenol, tramadol and robaxin for pain, she keep forgetting to try lidocaine patches Seasonal allergies- she believes it is increased due to warmer weather, reports improved symptoms with Clairtin, Flonase and Astelin HTN- BUN/creat 25/1.0 09/26/2021, remains on amlodipine, HCTZ and atenolol, Hydralazine prn for SBP> 170 Atrial fibrillation- HR controlled with digoxin- 0.56 06/2021, remains on aspirin for clot prevention AV block- s/p pacemaker, last remote checkup 01/03/2021 Left sided hemiplegia- PT 2 x/weekly, ambulates mainly with PWC, ambulates on own with assist,  remains in skilled care, no recent falls Thrombocytopenia- platelets 125 09/2021 Constipation- LBM 02/26, remains on miralax QOD  Recent blood pressures:  02/28- 161/83  02/27- 156/84  02/26- 149/88  Recent weights:  02/01- 128.6 lbs  01/05- 125.4 lbs  12/01- 126.5 lbs  Past Medical History:  Diagnosis Date   Actinic keratosis 05/25/2014   Anxiety    Atrial fibrillation ([place]) 09/21/2010   Basal cell carcinoma 05/25/2014   Multiple removed by Dr. Danella Sensing in March 2015: right neck, left neck, scalp    Bell's palsy 07/18/1979   Cervicalgia 01/31/2012   Closed fracture of lumbar vertebra without mention of spinal cord injury 07/17/2005   Conjunctiva disorder 12/26/2010   Coronary atherosclerosis of native coronary artery 07/17/1997   Cramp of limb 08/16/2009   Degeneration of lumbar or lumbosacral intervertebral disc 01/31/2012   Disturbance of skin sensation 08/2009   Dizziness and giddiness 11/08/2011   vertigo   External hemorrhoids without mention of complication 8/67/5449   External hemorrhoids without mention of complication 20/08/711   Ganglion of tendon sheath 08/16/2009   Leg cramp 10/27/2013   Most frequently the right leg.  Long term (current) use of anticoagulants 09/2010   Lumbago 07/2009   Meralgia paresthetica  07/2007   MI, old    Myalgia and myositis, unspecified 01/17/2012   Osteoporosis    Other abnormal blood chemistry 04/08/2012   Other abnormal blood chemistry 2013   hyperglycemia   Other disorder of muscle, ligament, and fascia 04/08/2012   Other specified cardiac dysrhythmias(427.89) 06/27/2010   Pacemaker 05/06/2020   Pain in joint, ankle and foot 10/27/2013   Bilateral since 1998    Pain in joint, shoulder region 08/12/2012   Pain in joint, upper arm 12/26/2010   Pain in limb 01/11/2011   Pain in thoracic spine 01/31/2012   Pathologic fracture of vertebrae 01/22/2012   PVC's (premature ventricular contractions)    Rash and other nonspecific skin eruption 08/02/2011   Senile osteoporosis 07/17/1993   Stroke ([place])    Unspecified essential hypertension 07/17/1997   Varicose veins of lower extremities 08/16/2009   Varicose veins of lower extremities 08/16/2009   Past Surgical History:  Procedure Laterality Date   COLONOSCOPY WITH PROPOFOL N/A 10/19/2015   Procedure: COLONOSCOPY WITH PROPOFOL;  Surgeon: Gatha Mayer, MD;  Location: [facility];  Service: Endoscopy;  Laterality: N/A;   CORONARY ARTERY BYPASS GRAFT  1998   x2; LIMA to LAD; SVG to diagonal off bypass   HEMORRHOID SURGERY  08/26/2012   Dr. Brantley Stage   LOOP RECORDER INSERTION N/A 05/13/2018   Procedure: LOOP RECORDER INSERTION;  Surgeon: [name], MD;  Location: [facility];  Service: Cardiovascular;  Laterality: N/A;   LOOP RECORDER REMOVAL N/A 05/26/2019   Procedure: LOOP RECORDER REMOVAL;  Surgeon: [name], MD;  Location: [facility];  Service: Cardiovascular;  Laterality: N/A;   MASS EXCISION Left 11/23/2015   Procedure: LEFT WRIST EXCISION CYST;  Surgeon: Leanora Cover, MD;  Location: [facility];  Service: Orthopedics;  Laterality: Left;   PACEMAKER IMPLANT N/A 05/26/2019   Procedure: PACEMAKER IMPLANT;  Surgeon: [name], MD;  Location: [facility];  Service: Cardiovascular;   Laterality: N/A;   SKIN BIOPSY  01/29/14   (R) neck; (R) scalp, 2 (L) neck; shave biopsy Superficial basal cell carcinoma Dr. Danella Sensing   TONSILLECTOMY  [phone]    Allergies  Allergen Reactions   Iodinated Contrast Media Nausea Only and Other (See Comments)    Severe nausea and also passed out   Iodine    Other     Seasonal Allergies.   Bactrim Rash   Relafen [Nabumetone] Rash   Sulfanilamide Rash    Outpatient Encounter Medications as of 01/03/2022  Medication Sig   acetaminophen (TYLENOL) 500 MG tablet Take 1,000 mg by mouth every 6 (six) hours as needed for mild pain (pain.).    amLODipine (NORVASC) 5 MG tablet Take 5 mg by mouth daily.   aspirin EC 81 MG EC tablet Take 1 tablet (81 mg total) by mouth daily. Swallow whole.   atenolol (TENORMIN) 50 MG tablet Take 50 mg by mouth 2 (two) times daily. HOLD FOR SPB <90 or HR< 60   azelastine (ASTELIN) 0.1 % nasal spray Place 2 sprays into both nostrils 2 (two) times daily. Use in each nostril as directed   calcium-vitamin D (OSCAL WITH D) 500-200 MG-UNIT tablet Take 1 tablet by mouth daily.   diclofenac Sodium (VOLTAREN) 1 % GEL Apply 4 g topically 4 (four) times daily as needed. Left knee, right knee, left ankle   digoxin (LANOXIN) 0.125  MG tablet Take 0.125 mg by mouth every other day.   fluticasone (FLONASE) 50 MCG/ACT nasal spray Place 1 spray into both nostrils daily.   hydrALAZINE (APRESOLINE) 10 MG tablet Take 10 mg by mouth 3 (three) times daily as needed. TID PRN for SBP >170   hydrochlorothiazide (HYDRODIURIL) 50 MG tablet Take 50 mg by mouth daily.   lidocaine (LIDODERM) 5 % Place 1 patch onto the skin daily as needed. Remove & Discard patch within 12 hours or as directed by MD   loratadine (CLARITIN) 10 MG tablet Take 10 mg by mouth at bedtime.   LORazepam (ATIVAN) 1 MG tablet Take 1 tablet (1 mg total) by mouth at bedtime.   methocarbamol (ROBAXIN) 500 MG tablet Take 0.5 tablets (250 mg total) by mouth 2 (two) times  daily as needed for muscle spasms.   ondansetron (ZOFRAN) 4 MG tablet Take 1 tablet (4 mg total) by mouth every 6 (six) hours as needed for nausea.   polyethylene glycol (MIRALAX / GLYCOLAX) 17 g packet Take 17 g by mouth every other day.   potassium chloride SA (KLOR-CON) 20 MEQ tablet Take 20 mEq by mouth daily.   traMADol (ULTRAM) 50 MG tablet Take 1 tablet (50 mg total) by mouth every 6 (six) hours as needed for moderate pain or severe pain.   No facility-administered encounter medications on file as of 01/03/2022.    Review of Systems  Constitutional:  Negative for chills, fatigue and fever.  HENT:  Negative for congestion and trouble swallowing.   Eyes:  Negative for visual disturbance.  Respiratory:  Negative for cough, shortness of breath and wheezing.   Cardiovascular:  Negative for chest pain and leg swelling.  Gastrointestinal:  Positive for constipation. Negative for abdominal distention, abdominal pain, diarrhea, nausea and vomiting.  Genitourinary:  Negative for dysuria, frequency and hematuria.  Musculoskeletal:  Positive for arthralgias, back pain and gait problem.  Skin:  Positive for wound.  Neurological:  Positive for weakness. Negative for dizziness and headaches.  Hematological:  Bruises/bleeds easily.  Psychiatric/Behavioral:  Negative for dysphoric mood and sleep disturbance. The patient is not nervous/anxious.    Immunization History  Administered Date(s) Administered   Influenza Whole 08/06/2012   Influenza, High Dose Seasonal PF 08/15/2019, 08/27/2020   Influenza,inj,Quad PF,6+ Mos 08/30/2018   Influenza-Unspecified 08/06/2013, 08/24/2014, 08/26/2015, 08/31/2016, 08/27/2017, 08/27/2020   Moderna Sars-Covid-2 Vaccination 11/16/2019, 12/16/2019, 09/16/2020   Pneumococcal Conjugate-13 09/22/2015   Pneumococcal Polysaccharide-23 08/29/2006   Td 10/07/2007   Zoster Recombinat (Shingrix) 02/12/2018, 05/13/2018   Zoster, Live 03/12/2008   Pertinent  Health  Maintenance Due  Topic Date Due   INFLUENZA VACCINE  02/03/2022 (Originally 06/06/2021)   DEXA SCAN  Completed   Fall Risk 08/23/2020 08/23/2020 08/26/2020 09/23/2020 09/27/2021  Falls in the past year? - - 1 1 0  Number of falls in past year - - - - -  Was there an injury with Fall? - - 1 1 0  Was there an injury with Fall? - - - Patient fractured pelvis and had a stroke -  Fall Risk Category Calculator - - 2 2 0  Fall Risk Category - - Moderate Moderate Low  Patient Fall Risk Level High fall risk High fall risk - Moderate fall risk Low fall risk  Patient at Risk for Falls Due to - - History of fall(s);Impaired balance/gait;Impaired mobility;Orthopedic patient - No Fall Risks  Fall risk Follow up - - Falls evaluation completed;Education provided;Falls prevention discussed;Follow up appointment - Falls evaluation  completed   Functional Status Survey:    Vitals:   01/03/22 1233  BP: (!) 156/84  Pulse: 76  Resp: 16  Temp: (!) 97.2 F (36.2 C)  SpO2: 98%  Weight: 128 lb 9.6 oz (58.3 kg)  Height: 5\' 6"  (1.676 m)   Body mass index is 20.76 kg/m. Physical Exam Vitals reviewed.  Constitutional:      General: She is not in acute distress. HENT:     Head: Normocephalic.  Eyes:     General:        Right eye: No discharge.        Left eye: No discharge.  Neck:     Vascular: No carotid bruit.  Cardiovascular:     Rate and Rhythm: Normal rate. Rhythm irregular.     Pulses: Normal pulses.     Heart sounds: Normal heart sounds. No murmur heard. Pulmonary:     Effort: Pulmonary effort is normal. No respiratory distress.     Breath sounds: Normal breath sounds. No wheezing.  Abdominal:     General: Bowel sounds are normal. There is no distension.     Palpations: Abdomen is soft.     Tenderness: There is no abdominal tenderness.  Musculoskeletal:     Cervical back: Neck supple.     Right lower leg: No edema.     Left lower leg: No edema.  Lymphadenopathy:     Cervical: No  cervical adenopathy.  Skin:    General: Skin is warm and dry.     Capillary Refill: Capillary refill takes less than 2 seconds.     Comments: Small bruise to left anterior shin, purple in color, tender to touch, no skin breakdown  Neurological:     General: No focal deficit present.     Mental Status: She is alert and oriented to person, place, and time.     Motor: Weakness present.     Gait: Gait abnormal.     Comments: Left sided weakness  Psychiatric:        Mood and Affect: Mood normal.        Behavior: Behavior normal.        Cognition and Memory: Memory is impaired.    Labs reviewed: Recent Labs    08/05/21 0000 08/16/21 0000 09/26/21 0000  NA 136* 133* 141  K 3.6 4.4 3.8  CL 96* 92* 100  CO2 31* 29* 29*  BUN 26* 18 25*  CREATININE 1.1 0.8 1.0  CALCIUM 9.1 8.9 9.4   Recent Labs    08/16/21 0000 09/26/21 0000  AST 30 15  ALT 23 8  ALKPHOS 87 90  ALBUMIN 3.1* 3.6   Recent Labs    05/20/21 0000 08/16/21 0000 09/26/21 0000  WBC 5.7 11.0 6.7  HGB 12.7 10.8* 11.5*  HCT 37 32* 36  PLT 121* 165 125*   Lab Results  Component Value Date   TSH 2.48 03/22/2021   Lab Results  Component Value Date   HGBA1C 5.7 (H) 08/19/2020   Lab Results  Component Value Date   CHOL 180 09/26/2021   HDL 44 09/26/2021   LDLCALC 122 09/26/2021   LDLDIRECT 81.3 08/19/2020   TRIG 70 09/26/2021   CHOLHDL 2 09/12/2010    Significant Diagnostic Results in last 30 days:  No results found.  Assessment/Plan 1. Acute left-sided low back pain without sciatica - 02/02 T12 compression fracture noted - pain intermittent, no radiation, no B/B issues - cont tylenol, tramadol and robaxin - will  trial Lidocaine patches qAM x 7 days - cont PT  2. Seasonal allergies - improved with Asteline, Claritin and Flonase  3. Essential hypertension - elevated at times, suspect related to recent back pain - cont amlodipine, HCTZ and atenolol  4. Permanent atrial fibrillation (HCC) - HR  controlled with digoxin - cont asa for clot prevention  5. Atrioventricular block, complete (HCC) - s/p pacemaker - checked remotely today  6. 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 Thrombocytopenia (HCC) - platelets stable  8. Slow transit constipation - LBM 02/26, abdomen soft - cont Miralax QOD   Family/ staff Communication: plan discussed with patient and nurse  Labs/tests ordered:  none

## 2022-01-12 ENCOUNTER — Non-Acute Institutional Stay (SKILLED_NURSING_FACILITY): Payer: Medicare Other | Admitting: Adult Health

## 2022-01-12 DIAGNOSIS — K5903 Drug induced constipation: Secondary | ICD-10-CM

## 2022-01-12 DIAGNOSIS — M545 Low back pain, unspecified: Secondary | ICD-10-CM

## 2022-01-12 DIAGNOSIS — R112 Nausea with vomiting, unspecified: Secondary | ICD-10-CM

## 2022-01-13 ENCOUNTER — Encounter: Payer: Self-pay | Admitting: Adult Health

## 2022-01-13 DIAGNOSIS — Z79899 Other long term (current) drug therapy: Secondary | ICD-10-CM | POA: Diagnosis not present

## 2022-01-13 LAB — BASIC METABOLIC PANEL
BUN: 22 — AB (ref 4–21)
CO2: 33 — AB (ref 13–22)
Chloride: 93 — AB (ref 99–108)
Creatinine: 1.1 (ref 0.5–1.1)
Glucose: 97
Potassium: 4.2 mEq/L (ref 3.5–5.1)
Sodium: 135 — AB (ref 137–147)

## 2022-01-13 LAB — CBC AND DIFFERENTIAL
HCT: 31 — AB (ref 36–46)
Hemoglobin: 10.4 — AB (ref 12.0–16.0)
Platelets: 143 10*3/uL — AB (ref 150–400)
WBC: 6.2

## 2022-01-13 LAB — CBC: RBC: 3.67 — AB (ref 3.87–5.11)

## 2022-01-13 LAB — COMPREHENSIVE METABOLIC PANEL: Calcium: 9.3 (ref 8.7–10.7)

## 2022-01-13 MED ORDER — DOCUSATE SODIUM 100 MG PO CAPS: 100.0000 mg | ORAL_CAPSULE | Freq: Every day | ORAL | 0 refills | Status: DC

## 2022-01-13 NOTE — Progress Notes (Addendum)
Location:  Occupational psychologist of Service:  SNF (31) Provider:   Cindi Carbon, [place] [phone]   [name], MD  Patient Care Team: [name], MD as PCP - General (Internal Medicine) [name], MD as PCP - Electrophysiology (Cardiology) [name], MD as Consulting Physician (Dermatology) [name], MD as Consulting Physician (Cardiology) [facility] [name], MD as Consulting Physician (Dermatology)  Extended Emergency Contact Information Primary Emergency Contact: [name] of [place] Phone: [phone] Mobile Phone: [phone] Relation: Daughter Secondary Emergency Contact: [name],[name]  [name] of [place] Phone: [phone] Relation: Son  Code Status:  DNR Goals of care: Advanced Directive information Advanced Directives 12/01/2021  Does Patient Have a Medical Advance Directive? Yes  Type of Paramedic of [place];Living will;Out of facility DNR (pink MOST or yellow form)  Does patient want to make changes to medical advance directive? No - Patient declined  Copy of [place] in Chart? Yes - validated most recent copy scanned in chart (See row information)  Pre-existing out of facility DNR order (yellow form or pink MOST form) Yellow form placed in chart (order not valid for inpatient use);Pink MOST form placed in chart (order not valid for inpatient use)     Chief Complaint  Patient presents with   Acute Visit    Vomit x 2    HPI:  Pt is a 88 y.o. female seen today for an acute visit for vomiting. Ms. Suder resides in skilled care due to a hx of CVA with left sided weakness. She has some mid to low back pain without radiculopathy and is using ultram. She is also working with therapy. Once last week and once this week after therapy she vomited a small amt of bile. In between these episodes  she feels fine. No diarrhea, abd pain, fever, body aches, sore throat, etc.  She is using ultram regularly. Bowels are not moving each day.   A lumbar/sacral xray was ordered on 2/2 which showed T 12 compression fracture age indeterminate and L1 burst fracture age indeterminate. She remains able to get out bed without help but at times needs the stand up lift. At times she has no pain, at others its a 6-7 in the mid to low back. No B/B changes. No numbness or tingling.    Past Medical History:  Diagnosis Date   Actinic keratosis 05/25/2014   Anxiety    Atrial fibrillation ([place]) 09/21/2010   Basal cell carcinoma 05/25/2014   Multiple removed by Dr. Danella Sensing in March 2015: right neck, left neck, scalp    Bell's palsy 07/18/1979   Cervicalgia 01/31/2012   Closed fracture of lumbar vertebra without mention of spinal cord injury 07/17/2005   Conjunctiva disorder 12/26/2010   Coronary atherosclerosis of native coronary artery 07/17/1997   Cramp of limb 08/16/2009   Degeneration of lumbar or lumbosacral intervertebral disc 01/31/2012   Disturbance of skin sensation 08/2009   Dizziness and giddiness 11/08/2011   vertigo   External hemorrhoids without mention of complication 2/77/4128   External hemorrhoids without mention of complication 78/67/6720   Ganglion of tendon sheath 08/16/2009   Leg cramp 10/27/2013   Most frequently the right leg.    Long term (current) use of anticoagulants 09/2010   Lumbago 07/2009   Meralgia paresthetica 07/2007   MI, old    Myalgia and myositis, unspecified 01/17/2012   Osteoporosis  Other abnormal blood chemistry 04/08/2012   Other abnormal blood chemistry 2013   hyperglycemia   Other disorder of muscle, ligament, and fascia 04/08/2012   Other specified cardiac dysrhythmias(427.89) 06/27/2010   Pacemaker 05/06/2020   Pain in joint, ankle and foot 10/27/2013   Bilateral since 1998    Pain in joint, shoulder region 08/12/2012   Pain in joint, upper arm 12/26/2010    Pain in limb 01/11/2011   Pain in thoracic spine 01/31/2012   Pathologic fracture of vertebrae 01/22/2012   PVC's (premature ventricular contractions)    Rash and other nonspecific skin eruption 08/02/2011   Senile osteoporosis 07/17/1993   Stroke ([place])    Unspecified essential hypertension 07/17/1997   Varicose veins of lower extremities 08/16/2009   Varicose veins of lower extremities 08/16/2009   Past Surgical History:  Procedure Laterality Date   COLONOSCOPY WITH PROPOFOL N/A 10/19/2015   Procedure: COLONOSCOPY WITH PROPOFOL;  Surgeon: Gatha Mayer, MD;  [facility];  Service: Endoscopy;  Laterality: N/A;   CORONARY ARTERY BYPASS GRAFT  1998   x2; LIMA to LAD; SVG to diagonal off bypass   HEMORRHOID SURGERY  08/26/2012   Dr. Brantley Stage   LOOP RECORDER INSERTION N/A 05/13/2018   Procedure: LOOP RECORDER INSERTION;  Surgeon: [name], MD;  Location: [facility];  Service: Cardiovascular;  Laterality: N/A;   LOOP RECORDER REMOVAL N/A 05/26/2019   Procedure: LOOP RECORDER REMOVAL;  Surgeon: [name], MD;  Location: [facility];  Service: Cardiovascular;  Laterality: N/A;   MASS EXCISION Left 11/23/2015   Procedure: LEFT WRIST EXCISION CYST;  Surgeon: Leanora Cover, MD;  Location: [facility];  Service: Orthopedics;  Laterality: Left;   PACEMAKER IMPLANT N/A 05/26/2019   Procedure: PACEMAKER IMPLANT;  Surgeon: [name], MD;  Location: [facility];  Service: Cardiovascular;  Laterality: N/A;   SKIN BIOPSY  01/29/14   (R) neck; (R) scalp, 2 (L) neck; shave biopsy Superficial basal cell carcinoma Dr. Danella Sensing   TONSILLECTOMY  [phone]    Allergies  Allergen Reactions   Iodinated Contrast Media Nausea Only and Other (See Comments)    Severe nausea and also passed out   Iodine    Other     Seasonal Allergies.   Bactrim Rash   Relafen [Nabumetone] Rash   Sulfanilamide Rash    Outpatient Encounter Medications as of 01/12/2022   Medication Sig   acetaminophen (TYLENOL) 500 MG tablet Take 1,000 mg by mouth every 6 (six) hours as needed for mild pain (pain.).    amLODipine (NORVASC) 5 MG tablet Take 5 mg by mouth daily.   aspirin EC 81 MG EC tablet Take 1 tablet (81 mg total) by mouth daily. Swallow whole.   atenolol (TENORMIN) 50 MG tablet Take 50 mg by mouth 2 (two) times daily. HOLD FOR SPB <90 or HR< 60   azelastine (ASTELIN) 0.1 % nasal spray Place 2 sprays into both nostrils 2 (two) times daily. Use in each nostril as directed   calcium-vitamin D (OSCAL WITH D) 500-200 MG-UNIT tablet Take 1 tablet by mouth daily.   diclofenac Sodium (VOLTAREN) 1 % GEL Apply 4 g topically 4 (four) times daily as needed. Left knee, right knee, left ankle   digoxin (LANOXIN) 0.125 MG tablet Take 0.125 mg by mouth every other day.   fluticasone (FLONASE) 50 MCG/ACT nasal spray Place 1 spray into both nostrils daily.   hydrALAZINE (APRESOLINE) 10 MG tablet Take 10  mg by mouth 3 (three) times daily as needed. TID PRN for SBP >170   hydrochlorothiazide (HYDRODIURIL) 50 MG tablet Take 50 mg by mouth daily.   lidocaine (LIDODERM) 5 % Place 1 patch onto the skin daily as needed. Remove & Discard patch within 12 hours or as directed by MD   loratadine (CLARITIN) 10 MG tablet Take 10 mg by mouth at bedtime.   LORazepam (ATIVAN) 1 MG tablet Take 1 tablet (1 mg total) by mouth at bedtime.   methocarbamol (ROBAXIN) 500 MG tablet Take 0.5 tablets (250 mg total) by mouth 2 (two) times daily as needed for muscle spasms.   ondansetron (ZOFRAN) 4 MG tablet Take 1 tablet (4 mg total) by mouth every 6 (six) hours as needed for nausea.   polyethylene glycol (MIRALAX / GLYCOLAX) 17 g packet Take 17 g by mouth every other day.   potassium chloride SA (KLOR-CON) 20 MEQ tablet Take 20 mEq by mouth daily.   traMADol (ULTRAM) 50 MG tablet Take 1 tablet (50 mg total) by mouth every 6 (six) hours as needed for moderate pain or severe pain.   No  facility-administered encounter medications on file as of 01/12/2022.    Review of Systems  Constitutional:  Positive for activity change. Negative for appetite change, chills, diaphoresis, fatigue, fever and unexpected weight change.  HENT:  Negative for congestion.   Respiratory:  Negative for cough, shortness of breath and wheezing.   Cardiovascular:  Negative for chest pain, palpitations and leg swelling.  Gastrointestinal:  Positive for constipation, nausea and vomiting. Negative for abdominal distention, abdominal pain and diarrhea.  Genitourinary:  Negative for difficulty urinating and dysuria.  Musculoskeletal:  Positive for back pain and gait problem. Negative for arthralgias, joint swelling and myalgias.  Neurological:  Positive for speech difficulty and weakness. Negative for dizziness, tremors, seizures, syncope, light-headedness, numbness and headaches.  Psychiatric/Behavioral:  Negative for agitation, behavioral problems and confusion.    Immunization History  Administered Date(s) Administered   Influenza Whole 08/06/2012   Influenza, High Dose Seasonal PF 08/15/2019, 08/27/2020   Influenza,inj,Quad PF,6+ Mos 08/30/2018   Influenza-Unspecified 08/06/2013, 08/24/2014, 08/26/2015, 08/31/2016, 08/27/2017, 08/27/2020   Moderna Sars-Covid-2 Vaccination 11/16/2019, 12/16/2019, 09/16/2020   Pneumococcal Conjugate-13 09/22/2015   Pneumococcal Polysaccharide-23 08/29/2006   Td 10/07/2007   Zoster Recombinat (Shingrix) 02/12/2018, 05/13/2018   Zoster, Live 03/12/2008   Pertinent  Health Maintenance Due  Topic Date Due   INFLUENZA VACCINE  02/03/2022 (Originally 06/06/2021)   DEXA SCAN  Completed   Fall Risk 08/23/2020 08/23/2020 08/26/2020 09/23/2020 09/27/2021  Falls in the past year? - - 1 1 0  Number of falls in past year - - - - -  Was there an injury with Fall? - - 1 1 0  Was there an injury with Fall? - - - Patient fractured pelvis and had a stroke -  Fall Risk Category  Calculator - - 2 2 0  Fall Risk Category - - Moderate Moderate Low  Patient Fall Risk Level High fall risk High fall risk - Moderate fall risk Low fall risk  Patient at Risk for Falls Due to - - History of fall(s);Impaired balance/gait;Impaired mobility;Orthopedic patient - No Fall Risks  Fall risk Follow up - - Falls evaluation completed;Education provided;Falls prevention discussed;Follow up appointment - Falls evaluation completed   Functional Status Survey:    Vitals:   01/13/22 0856  BP: (!) 148/82  Pulse: 65  Resp: 18  Temp: 98.3 F (36.8 C)  SpO2: 96%  There is no height or weight on file to calculate BMI. Physical Exam Vitals and nursing note reviewed.  Constitutional:      General: She is not in acute distress.    Appearance: She is not diaphoretic.  HENT:     Head: Normocephalic and atraumatic.     Mouth/Throat:     Mouth: Mucous membranes are dry.     Pharynx: Oropharynx is clear. No oropharyngeal exudate.  Eyes:     Conjunctiva/sclera: Conjunctivae normal.     Pupils: Pupils are equal, round, and reactive to light.  Neck:     Vascular: No JVD.  Cardiovascular:     Rate and Rhythm: Normal rate. Rhythm irregular.     Heart sounds: No murmur heard. Pulmonary:     Effort: Pulmonary effort is normal. No respiratory distress.     Breath sounds: Normal breath sounds. No wheezing.  Abdominal:     General: Abdomen is flat. Bowel sounds are normal. There is no distension.     Palpations: Abdomen is soft.     Tenderness: There is no abdominal tenderness.  Musculoskeletal:        General: No swelling, tenderness, deformity or signs of injury.  Skin:    General: Skin is warm and dry.  Neurological:     Mental Status: She is alert and oriented to person, place, and time.  Psychiatric:        Mood and Affect: Mood normal.    Labs reviewed: Recent Labs    08/05/21 0000 08/16/21 0000 09/26/21 0000  NA 136* 133* 141  K 3.6 4.4 3.8  CL 96* 92* 100  CO2 31* 29*  29*  BUN 26* 18 25*  CREATININE 1.1 0.8 1.0  CALCIUM 9.1 8.9 9.4   Recent Labs    08/16/21 0000 09/26/21 0000  AST 30 15  ALT 23 8  ALKPHOS 87 90  ALBUMIN 3.1* 3.6   Recent Labs    05/20/21 0000 08/16/21 0000 09/26/21 0000  WBC 5.7 11.0 6.7  HGB 12.7 10.8* 11.5*  HCT 37 32* 36  PLT 121* 165 125*   Lab Results  Component Value Date   TSH 2.48 03/22/2021   Lab Results  Component Value Date   HGBA1C 5.7 (H) 08/19/2020   Lab Results  Component Value Date   CHOL 180 09/26/2021   HDL 44 09/26/2021   LDLCALC 122 09/26/2021   LDLDIRECT 81.3 08/19/2020   TRIG 70 09/26/2021   CHOLHDL 2 09/12/2010    Significant Diagnostic Results in last 30 days:  No results found.  Assessment/Plan 1. Nausea and vomiting in adult Possibly due to ultram and/or constipation Good bowel sounds with no fever No symptoms at this time If this become more frequent will need to investigate further At this time may continue prn zofran and treat constipation Also will schedule tylenol in an attempt to use less ultram.   2. Drug-induced constipation Increase miralax to daily Add colace.   3. Acute left-sided low back pain without sciatica Due to T12 and L1 compression fractures with DDD Will schedule tylenol as stated above Continue lidocaine patch and tramadol Continue therapy as tolerated.  Will give her two more weeks (total of 6), if no improvement will consider additional imaging.      Family/ staff Communication: resident and her nurse Misti  Labs/tests ordered:  CBC CMP Dig level

## 2022-01-16 ENCOUNTER — Telehealth: Payer: Self-pay

## 2022-01-16 NOTE — Telephone Encounter (Signed)
Pts son in law answered the phone and stated that Susan Conway, daughter and the pt were unavailable but he will ask them to call back to schedule an appt.

## 2022-01-23 ENCOUNTER — Non-Acute Institutional Stay (SKILLED_NURSING_FACILITY): Payer: Medicare Other | Admitting: Internal Medicine

## 2022-01-23 ENCOUNTER — Encounter: Payer: Self-pay | Admitting: Internal Medicine

## 2022-01-23 DIAGNOSIS — K5903 Drug induced constipation: Secondary | ICD-10-CM

## 2022-01-23 DIAGNOSIS — I4821 Permanent atrial fibrillation: Secondary | ICD-10-CM

## 2022-01-23 DIAGNOSIS — R112 Nausea with vomiting, unspecified: Secondary | ICD-10-CM | POA: Diagnosis not present

## 2022-01-23 DIAGNOSIS — I69354 Hemiplegia and hemiparesis following cerebral infarction affecting left non-dominant side: Secondary | ICD-10-CM

## 2022-01-23 DIAGNOSIS — M545 Low back pain, unspecified: Secondary | ICD-10-CM

## 2022-01-23 DIAGNOSIS — I10 Essential (primary) hypertension: Secondary | ICD-10-CM

## 2022-01-23 NOTE — Progress Notes (Addendum)
? ?Location:  [facility] ?[facility]ing [facility] Room Number: 118/ ?Place of Service:  SNF (31) ? ?Provider: Veleta Miners MD ? ?Code Status: DNR ?Goals of Care:  ?Advanced Directives 01/23/2022  ?Does Patient Have a Medical Advance Directive? Yes  ?Type of Paramedic of [place];Living will;Out of facility DNR (pink MOST or yellow form)  ?Does patient want to make changes to medical advance directive? No - Patient declined  ?Copy of [place] in Chart? Yes - validated most recent copy scanned in chart (See row information)  ?Pre-existing out of facility DNR order (yellow form or pink MOST form) Yellow form placed in chart (order not valid for inpatient use);Pink MOST form placed in chart (order not valid for inpatient use)  ? ? ? ?Chief Complaint  ?Patient presents with  ? Medical Management of Chronic Issues  ? Quality Metric Gaps  ?  Verified Matrix and [facility] patient is due for TDAP  ? ? ?HPI: Patient is a 88 y.o. female seen today for medical management of chronic diseases.   ? ?Patient has a history of CAD,  ?thrombocytopenia  ?history of GI bleed on Coumadin and history of ruptured Baker's cyst with hemorrhage on Eliquis ?Also has history of hyperlipidemia and hyperglycemia ?She had right basal ganglia embolic stroke with flaccid left hemiparesis when she was hospitalized after a fall in which she sustained pelvic fractures.  With extraperitoneal hemorrhage in 10/21 ?Patient was  off Eliquis before her stroke due to ruptured Baker's cyst with hemorrhage before her embolic stroke ? ?Hypertension ?Has been getting off and on High readings ?Her  Ophthalmologist saw Hypertensive Retinopathy suggesting better control of BP ? ?S/p Left Hemiparesis ?Doing well with therapy  ?Walking with her Hemiwalker and therapy assist ?Speech has improved ? ?Per her Nausea and Vomiting is now resolved ?Back pain is also better since they have started using [place] for her  transfers ?No Other issue today ? ?Wt Readings from Last 3 Encounters:  ?01/23/22 127 lb 3.2 oz (57.7 kg)  ?01/03/22 128 lb 9.6 oz (58.3 kg)  ?12/01/21 125 lb 6.4 oz (56.9 kg)  ?  ?  ?Past Medical History:  ?Diagnosis Date  ? Actinic keratosis 05/25/2014  ? Anxiety   ? Atrial fibrillation ([place]) 09/21/2010  ? Basal cell carcinoma 05/25/2014  ? Multiple removed by Dr. Danella Sensing in March 2015: right neck, left neck, scalp   ? Bell's palsy 07/18/1979  ? Cervicalgia 01/31/2012  ? Closed fracture of lumbar vertebra without mention of spinal cord injury 07/17/2005  ? Conjunctiva disorder 12/26/2010  ? Coronary atherosclerosis of native coronary artery 07/17/1997  ? Cramp of limb 08/16/2009  ? Degeneration of lumbar or lumbosacral intervertebral disc 01/31/2012  ? Disturbance of skin sensation 08/2009  ? Dizziness and giddiness 11/08/2011  ? vertigo  ? External hemorrhoids without mention of complication 2/45/8099  ? External hemorrhoids without mention of complication 83/38/2505  ? Ganglion of tendon sheath 08/16/2009  ? Leg cramp 10/27/2013  ? Most frequently the right leg.   ? Long term (current) use of anticoagulants 09/2010  ? Lumbago 07/2009  ? Meralgia paresthetica 07/2007  ? MI, old   ? Myalgia and myositis, unspecified 01/17/2012  ? Osteoporosis   ? Other abnormal blood chemistry 04/08/2012  ? Other abnormal blood chemistry 2013  ? hyperglycemia  ? Other disorder of muscle, ligament, and fascia 04/08/2012  ? Other specified cardiac dysrhythmias(427.89) 06/27/2010  ? Pacemaker 05/06/2020  ? Pain in joint,  ankle and foot 10/27/2013  ? Bilateral since 1998   ? Pain in joint, shoulder region 08/12/2012  ? Pain in joint, upper arm 12/26/2010  ? Pain in limb 01/11/2011  ? Pain in thoracic spine 01/31/2012  ? Pathologic fracture of vertebrae 01/22/2012  ? PVC's (premature ventricular contractions)   ? Rash and other nonspecific skin eruption 08/02/2011  ? Senile osteoporosis 07/17/1993  ? Stroke [facility])   ? Unspecified essential hypertension  07/17/1997  ? Varicose veins of lower extremities 08/16/2009  ? Varicose veins of lower extremities 08/16/2009  ? ? ?Past Surgical History:  ?Procedure Laterality Date  ? COLONOSCOPY WITH PROPOFOL N/A 10/19/2015  ? Procedure: COLONOSCOPY WITH PROPOFOL;  Surgeon: Gatha Mayer, MD;  [facility];  Service: Endoscopy;  Laterality: N/A;  ? CORONARY ARTERY BYPASS GRAFT  1998  ? x2; LIMA to LAD; SVG to diagonal off bypass  ? HEMORRHOID SURGERY  08/26/2012  ? Dr. Brantley Stage  ? LOOP RECORDER INSERTION N/A 05/13/2018  ? Procedure: LOOP RECORDER INSERTION;  Surgeon: [name], MD;  Location: [place];  Service: Cardiovascular;  Laterality: N/A;  ? LOOP RECORDER REMOVAL N/A 05/26/2019  ? Procedure: LOOP RECORDER REMOVAL;  Surgeon: [name], MD;  Location: [place] [facility];  Service: Cardiovascular;  Laterality: N/A;  ? MASS EXCISION Left 11/23/2015  ? Procedure: LEFT WRIST EXCISION CYST;  Surgeon: Leanora Cover, MD;  Location: [facility];  Service: Orthopedics;  Laterality: Left;  ? PACEMAKER IMPLANT N/A 05/26/2019  ? Procedure: PACEMAKER IMPLANT;  Surgeon: [name], MD;  Location: [place] [facility];  Service: Cardiovascular;  Laterality: N/A;  ? SKIN BIOPSY  01/29/14  ? (R) neck; (R) scalp, 2 (L) neck; shave biopsy Superficial basal cell carcinoma Dr. Danella Sensing  ? TONSILLECTOMY  1940's  ? ? ?Allergies  ?Allergen Reactions  ? Iodinated Contrast Media Nausea Only and Other (See Comments)  ?  Severe nausea and also passed out  ? Iodine   ? Other   ?  Seasonal Allergies.  ? Bactrim Rash  ? Relafen [Nabumetone] Rash  ? Sulfanilamide Rash  ? ? ?Outpatient Encounter Medications as of 01/23/2022  ?Medication Sig  ? acetaminophen (TYLENOL) 500 MG tablet Take 1,000 mg by mouth in the morning, at noon, and at bedtime.  ? amLODipine (NORVASC) 5 MG tablet Take 5 mg by mouth daily.  ? amoxicillin (AMOXIL) 500 MG capsule Take 500 mg by mouth 3 (three) times daily.  ? aspirin EC 81 MG EC  tablet Take 1 tablet (81 mg total) by mouth daily. Swallow whole.  ? atenolol (TENORMIN) 50 MG tablet Take 50 mg by mouth 2 (two) times daily. HOLD FOR SPB <90 or HR< 60  ? azelastine (ASTELIN) 0.1 % nasal spray Place 2 sprays into both nostrils 2 (two) times daily. Use in each nostril as directed  ? calcium-vitamin D (OSCAL WITH D) 500-200 MG-UNIT tablet Take 1 tablet by mouth daily.  ? diclofenac Sodium (VOLTAREN) 1 % GEL Apply 4 g topically 4 (four) times daily as needed. Left knee, right knee, left ankle  ? digoxin (LANOXIN) 0.125 MG tablet Take 0.125 mg by mouth every other day.  ? docusate sodium (COLACE) 100 MG capsule Take 1 capsule (100 mg total) by mouth daily.  ? fluticasone (FLONASE) 50 MCG/ACT nasal spray Place 1 spray into both nostrils daily.  ? hydrALAZINE (APRESOLINE) 10 MG tablet Take 10 mg by mouth 3 (three) times daily as needed. TID  PRN for SBP >170  ? hydrochlorothiazide (HYDRODIURIL) 50 MG tablet Take 50 mg by mouth daily.  ? lidocaine (LIDODERM) 5 % Place 1 patch onto the skin daily. Remove & Discard patch within 12 hours or as directed by MD  ? loratadine (CLARITIN) 10 MG tablet Take 10 mg by mouth at bedtime.  ? LORazepam (ATIVAN) 1 MG tablet Take 1 tablet (1 mg total) by mouth at bedtime.  ? methocarbamol (ROBAXIN) 500 MG tablet Take 0.5 tablets (250 mg total) by mouth 2 (two) times daily as needed for muscle spasms.  ? ondansetron (ZOFRAN) 4 MG tablet Take 1 tablet (4 mg total) by mouth every 6 (six) hours as needed for nausea.  ? polyethylene glycol (MIRALAX / GLYCOLAX) 17 g packet Take 17 g by mouth daily.  ? potassium chloride SA (KLOR-CON) 20 MEQ tablet Take 20 mEq by mouth daily.  ? [DISCONTINUED] traMADol (ULTRAM) 50 MG tablet Take 1 tablet (50 mg total) by mouth every 6 (six) hours as needed for moderate pain or severe pain.  ? ?No facility-administered encounter medications on file as of 01/23/2022.  ? ? ?Review of Systems:  ?Review of Systems  ?Constitutional:  Negative for  activity change and appetite change.  ?HENT: Negative.    ?Respiratory:  Negative for cough and shortness of breath.   ?Cardiovascular:  Negative for leg swelling.  ?Gastrointestinal:  Negative for constipation.  ?Ge

## 2022-01-25 NOTE — Telephone Encounter (Signed)
Shoes dispensed to pats daughter whom stated that the pt will try shoes on with physical therapist to make sure that they are suitable and fit properly for the pt as she has had a stroke and they wont to make sure that the shoe is not to heavy

## 2022-02-02 ENCOUNTER — Non-Acute Institutional Stay (SKILLED_NURSING_FACILITY): Payer: Medicare Other | Admitting: Adult Health

## 2022-02-02 ENCOUNTER — Encounter: Payer: Self-pay | Admitting: Adult Health

## 2022-02-02 ENCOUNTER — Telehealth: Payer: Self-pay

## 2022-02-02 DIAGNOSIS — I69354 Hemiplegia and hemiparesis following cerebral infarction affecting left non-dominant side: Secondary | ICD-10-CM | POA: Diagnosis not present

## 2022-02-02 DIAGNOSIS — I10 Essential (primary) hypertension: Secondary | ICD-10-CM

## 2022-02-02 DIAGNOSIS — N390 Urinary tract infection, site not specified: Secondary | ICD-10-CM | POA: Diagnosis not present

## 2022-02-02 DIAGNOSIS — R410 Disorientation, unspecified: Secondary | ICD-10-CM | POA: Diagnosis not present

## 2022-02-02 LAB — COMPREHENSIVE METABOLIC PANEL: Calcium: 10 (ref 8.7–10.7)

## 2022-02-02 LAB — CBC: RBC: 3.72 — AB (ref 3.87–5.11)

## 2022-02-02 LAB — BASIC METABOLIC PANEL
BUN: 25 — AB (ref 4–21)
CO2: 23 — AB (ref 13–22)
Chloride: 93 — AB (ref 99–108)
Creatinine: 1.1 (ref 0.5–1.1)
Glucose: 80
Potassium: 4.1 mEq/L (ref 3.5–5.1)
Sodium: 135 — AB (ref 137–147)

## 2022-02-02 LAB — CBC AND DIFFERENTIAL
HCT: 32 — AB (ref 36–46)
Hemoglobin: 10.8 — AB (ref 12.0–16.0)
Platelets: 138 10*3/uL — AB (ref 150–400)
WBC: 7.4

## 2022-02-02 MED ORDER — LORAZEPAM 0.5 MG PO TABS: 0.5000 mg | ORAL_TABLET | Freq: Two times a day (BID) | ORAL | 0 refills | Status: AC | PRN

## 2022-02-02 NOTE — Telephone Encounter (Signed)
Received email from [employer] at [employer] patient is requesting a phone call from Royal Hawthorn, NP. ? ?Called patient. Patient's daughter [name] is requesting Melvyn Novas give her a call. She states she has been back and forth with her mom and hasn't much sleep. She was up at 4:30a contacting the skilled nurse concerned about the patient's medication, change in her behavior. You can call her at [phone] which is her cell that has no voicemail or [phone] which is her home and says her husband may answer and you have permision to leave a message with him to return your call.  ?

## 2022-02-02 NOTE — Progress Notes (Signed)
?Location:  [facility] Room Number: 118/A ?Place of Service:  SNF (31) ?Provider: Royal Hawthorn, NP ? ?Patient Care Team: ?[name], MD as PCP - General (Internal Medicine) ?[name], MD as PCP - Electrophysiology (Cardiology) ?[name], MD as Consulting Physician (Dermatology) ?[name], MD as Consulting Physician (Cardiology) ?[facility] ?Danella Sensing, MD as Consulting Physician (Dermatology) ? ?Extended Emergency Contact Information ?Primary Emergency Contact: [name],Janet ? [place] ?Home Phone: [phone] ?Mobile Phone: [phone] ?Relation: Daughter ?Secondary Emergency Contact: [name],[name] ? [place] ?Home Phone: [phone] ?Relation: Son ? ?Code Status:  DNR ?Goals of care: Advanced Directive information ? ?  02/02/2022  ? 10:46 AM  ?Advanced Directives  ?Does Patient Have a Medical Advance Directive? Yes  ?Type of Paramedic of [place];Living will;Out of facility DNR (pink MOST or yellow form)  ?Does patient want to make changes to medical advance directive? No - Patient declined  ?Copy of [place] in Chart? Yes - validated most recent copy scanned in chart (See row information)  ?Pre-existing out of facility DNR order (yellow form or pink MOST form) Yellow form placed in chart (order not valid for inpatient use);Pink MOST form placed in chart (order not valid for inpatient use)  ? ? ? ?Chief Complaint  ?Patient presents with  ? Acute Visit  ?  Change in behavior  ? ? ?HPI:  ?Pt is a 88 y.o. female seen today for an acute visit for change in behavior. Ms. Freas has a hx of CVA with left sided weakness and resides in skilled care. For the past two weeks Ms. Garczynski has been more talkative and "happier".  At first it seemed to she was just in a positive mood but then she changed with more talkativeness. Easily angered. Not sleeping well. Seemed  somewhat paranoid and perseverating with staff. She denies any feelings of illness. No dysuria, frequency. In fact she is "feels great". She is moving her left side more and feels she has improved physically. Her behaviors are very out of character.  ? ? ?Past Medical History:  ?Diagnosis Date  ? Actinic keratosis 05/25/2014  ? Anxiety   ? Atrial fibrillation ([place]) 09/21/2010  ? Basal cell carcinoma 05/25/2014  ? Multiple removed by Dr. Danella Sensing in March 2015: right neck, left neck, scalp   ? Bell's palsy 07/18/1979  ? Cervicalgia 01/31/2012  ? Closed fracture of lumbar vertebra without mention of spinal cord injury 07/17/2005  ? Conjunctiva disorder 12/26/2010  ? Coronary atherosclerosis of native coronary artery 07/17/1997  ? Cramp of limb 08/16/2009  ? Degeneration of lumbar or lumbosacral intervertebral disc 01/31/2012  ? Disturbance of skin sensation 08/2009  ? Dizziness and giddiness 11/08/2011  ? vertigo  ? External hemorrhoids without mention of complication 2/63/7858  ? External hemorrhoids without mention of complication 85/12/7739  ? Ganglion of tendon sheath 08/16/2009  ? Leg cramp 10/27/2013  ? Most frequently the right leg.   ? Long term (current) use of anticoagulants 09/2010  ? Lumbago 07/2009  ? Meralgia paresthetica 07/2007  ? MI, old   ? Myalgia and myositis, unspecified 01/17/2012  ? Osteoporosis   ? Other abnormal blood chemistry 04/08/2012  ? Other abnormal blood chemistry 2013  ? hyperglycemia  ? Other disorder of muscle, ligament, and fascia 04/08/2012  ? Other specified cardiac dysrhythmias(427.89) 06/27/2010  ? Pacemaker 05/06/2020  ? Pain in joint, ankle and foot 10/27/2013  ?  Bilateral since 1998   ? Pain in joint, shoulder region 08/12/2012  ? Pain in joint, upper arm 12/26/2010  ? Pain in limb 01/11/2011  ? Pain in thoracic spine 01/31/2012  ? Pathologic fracture of vertebrae 01/22/2012  ? PVC's (premature ventricular contractions)   ? Rash and other nonspecific skin eruption 08/02/2011  ? Senile  osteoporosis 07/17/1993  ? Stroke [facility])   ? Unspecified essential hypertension 07/17/1997  ? Varicose veins of lower extremities 08/16/2009  ? Varicose veins of lower extremities 08/16/2009  ? ?Past Surgical History:  ?Procedure Laterality Date  ? COLONOSCOPY WITH PROPOFOL N/A 10/19/2015  ? Procedure: COLONOSCOPY WITH PROPOFOL;  Surgeon: Gatha Mayer, MD;  [facility]: [facility];  Service: Endoscopy;  Laterality: N/A;  ? CORONARY ARTERY BYPASS GRAFT  1998  ? x2; LIMA to LAD; SVG to diagonal off bypass  ? HEMORRHOID SURGERY  08/26/2012  ? Dr. Brantley Stage  ? LOOP RECORDER INSERTION N/A 05/13/2018  ? Procedure: LOOP RECORDER INSERTION;  Surgeon: [name], MD;  Location: [facility] LAB;  Service: Cardiovascular;  Laterality: N/A;  ? LOOP RECORDER REMOVAL N/A 05/26/2019  ? Procedure: LOOP RECORDER REMOVAL;  Surgeon: [name], MD;  Location: [place] [place] [facility];  Service: Cardiovascular;  Laterality: N/A;  ? MASS EXCISION Left 11/23/2015  ? Procedure: LEFT WRIST EXCISION CYST;  Surgeon: Leanora Cover, MD;  Location: [facility];  Service: Orthopedics;  Laterality: Left;  ? PACEMAKER IMPLANT N/A 05/26/2019  ? Procedure: PACEMAKER IMPLANT;  Surgeon: [name], MD;  Location: [place] [facility] LAB;  Service: Cardiovascular;  Laterality: N/A;  ? SKIN BIOPSY  01/29/14  ? (R) neck; (R) scalp, 2 (L) neck; shave biopsy Superficial basal cell carcinoma Dr. Danella Sensing  ? TONSILLECTOMY  1940's  ? ? ?Allergies  ?Allergen Reactions  ? Iodinated Contrast Media Nausea Only and Other (See Comments)  ?  Severe nausea and also passed out  ? Iodine   ? Other   ?  Seasonal Allergies.  ? Bactrim Rash  ? Relafen [Nabumetone] Rash  ? Sulfanilamide Rash  ? ? ?Outpatient Encounter Medications as of 02/02/2022  ?Medication Sig  ? acetaminophen (TYLENOL) 500 MG tablet Take 1,000 mg by mouth in the morning, at noon, and at bedtime.  ? amLODipine (NORVASC) 5 MG tablet Take 5 mg by mouth daily.  ? aspirin EC 81 MG EC tablet Take  1 tablet (81 mg total) by mouth daily. Swallow whole.  ? atenolol (TENORMIN) 50 MG tablet Take 50 mg by mouth 2 (two) times daily. HOLD FOR SPB <90 or HR< 60  ? azelastine (ASTELIN) 0.1 % nasal spray Place 2 sprays into both nostrils 2 (two) times daily. Use in each nostril as directed  ? calcium-vitamin D (OSCAL WITH D) 500-200 MG-UNIT tablet Take 1 tablet by mouth daily.  ? diclofenac Sodium (VOLTAREN) 1 % GEL Apply 4 g topically 4 (four) times daily as needed. Left knee, right knee, left ankle, back or side  ? digoxin (LANOXIN) 0.125 MG tablet Take 0.125 mg by mouth every other day.  ? docusate sodium (COLACE) 100 MG capsule Take 1 capsule (100 mg total) by mouth daily.  ? fluticasone (FLONASE) 50 MCG/ACT nasal spray Place 1 spray into both nostrils daily.  ? hydrALAZINE (APRESOLINE) 10 MG tablet Take 10 mg by mouth 3 (three) times daily. TID PRN for SBP >170  ? hydrochlorothiazide (HYDRODIURIL) 50 MG tablet Take 25 mg by mouth daily.  ? lidocaine (LIDODERM) 5 %  Place 1 patch onto the skin daily. Remove & Discard patch within 12 hours or as directed by MD  ? loratadine (CLARITIN) 10 MG tablet Take 10 mg by mouth at bedtime.  ? LORazepam (ATIVAN) 1 MG tablet Take 1 tablet (1 mg total) by mouth at bedtime.  ? methocarbamol (ROBAXIN) 500 MG tablet Take 0.5 tablets (250 mg total) by mouth 2 (two) times daily as needed for muscle spasms.  ? ondansetron (ZOFRAN) 4 MG tablet Take 1 tablet (4 mg total) by mouth every 6 (six) hours as needed for nausea.  ? polyethylene glycol (MIRALAX / GLYCOLAX) 17 g packet Take 17 g by mouth daily.  ? potassium chloride SA (KLOR-CON) 20 MEQ tablet Take 10 mEq by mouth daily.  ? traMADol (ULTRAM) 50 MG tablet Take 50 mg by mouth every 6 (six) hours as needed for moderate pain.  ? ?No facility-administered encounter medications on file as of 02/02/2022.  ? ? ?Review of Systems  ?Constitutional:  Negative for activity change, appetite change, chills, diaphoresis, fatigue, fever and  unexpected weight change.  ?HENT:  Positive for rhinorrhea. Negative for congestion.   ?Eyes:  Negative for visual disturbance.  ?Respiratory:  Negative for cough, shortness of breath and wheezing.   ?Cardiovascular:

## 2022-02-02 NOTE — Telephone Encounter (Signed)
Called and spoke with resident's daughter Susan Conway. Pt is having some delirium. Concern for UTI or electrolyte imbalance. Will order labs and UA C and [name] agreed.  ?

## 2022-02-03 ENCOUNTER — Telehealth: Payer: Self-pay

## 2022-02-03 ENCOUNTER — Encounter: Payer: Self-pay | Admitting: Adult Health

## 2022-02-03 ENCOUNTER — Non-Acute Institutional Stay (SKILLED_NURSING_FACILITY): Payer: Medicare Other | Admitting: Adult Health

## 2022-02-03 DIAGNOSIS — Z79899 Other long term (current) drug therapy: Secondary | ICD-10-CM | POA: Diagnosis not present

## 2022-02-03 DIAGNOSIS — R410 Disorientation, unspecified: Secondary | ICD-10-CM | POA: Diagnosis not present

## 2022-02-03 DIAGNOSIS — J302 Other seasonal allergic rhinitis: Secondary | ICD-10-CM | POA: Diagnosis not present

## 2022-02-03 DIAGNOSIS — I10 Essential (primary) hypertension: Secondary | ICD-10-CM

## 2022-02-03 DIAGNOSIS — I69354 Hemiplegia and hemiparesis following cerebral infarction affecting left non-dominant side: Secondary | ICD-10-CM | POA: Diagnosis not present

## 2022-02-03 LAB — VITAMIN B12: Vitamin B-12: 1343

## 2022-02-03 LAB — TSH: TSH: 2.87 (ref <=5.90)

## 2022-02-03 NOTE — Telephone Encounter (Signed)
Shoes Ordered - [place] ?

## 2022-02-03 NOTE — Telephone Encounter (Signed)
Pts daughter, [name], called and stated that the physical therapist says that the shoes are too snug for the pt so she will be returning them this morning and we have put in a rush order for a 9 wide in the exact same shoe.

## 2022-02-03 NOTE — Progress Notes (Addendum)
?Location:  [facility] ?  ?Place of Service:  SNF (31) ?Provider:   ?Cindi Carbon, ANP ?[place] ?[phone] ? ? ?[name], MD ? ?Patient Care Team: ?[name], MD as PCP - General (Internal Medicine) ?[name], MD as PCP - Electrophysiology (Cardiology) ?[name], MD as Consulting Physician (Dermatology) ?[name], MD as Consulting Physician (Cardiology) ?[facility] ?Danella Sensing, MD as Consulting Physician (Dermatology) ? ?Extended Emergency Contact Information ?Primary Emergency Contact: [name],Janet ? [place] ?Home Phone: [phone] ?Mobile Phone: [phone] ?Relation: Daughter ?Secondary Emergency Contact: [name],[name] ? [place] ?Home Phone: [phone] ?Relation: Son ? ?Code Status:  DNR ?Goals of care: Advanced Directive information ? ?  02/02/2022  ? 10:46 AM  ?Advanced Directives  ?Does Patient Have a Medical Advance Directive? Yes  ?Type of Paramedic of [place];Living will;Out of facility DNR (pink MOST or yellow form)  ?Does patient want to make changes to medical advance directive? No - Patient declined  ?Copy of [place] in Chart? Yes - validated most recent copy scanned in chart (See row information)  ?Pre-existing out of facility DNR order (yellow form or pink MOST form) Yellow form placed in chart (order not valid for inpatient use);Pink MOST form placed in chart (order not valid for inpatient use)  ? ? ? ?Chief Complaint  ?Patient presents with  ? Acute Visit  ?  F/u labs and delirium  ? ? ?HPI:  ?Pt is a 88 y.o. female seen today for an acute visit for f/u regarding labs and delirium ?02/02/22 ?Ms. Sedlar has a hx of CVA with left sided weakness (right basal ganglia infarct) and resides in skilled care. For the past two weeks Ms. Siegfried has been more talkative and "happier".  At first it seemed to she was just in a positive mood  but then she changed with more talkativeness. Easily angered. Not sleeping well. Seemed somewhat paranoid and perseverating with staff. She denies any feelings of illness. No dysuria, frequency. In fact she is "feels great". She is moving her left side more and feels she has improved physically. Her behaviors are very out of character. Ativan ordered prn. CBC and BMP and UA ordered.  ? ?Update 02/03/22: ?She tried ativan last evening and helped to calm her down CBC and BMP and UA returned with no acute findings. She remains talkative and seems to perseverate over past life events and her bed position.  ? ?Past Medical History:  ?Diagnosis Date  ? Actinic keratosis 05/25/2014  ? Anxiety   ? Atrial fibrillation ([place]) 09/21/2010  ? Basal cell carcinoma 05/25/2014  ? Multiple removed by Dr. Danella Sensing in March 2015: right neck, left neck, scalp   ? Bell's palsy 07/18/1979  ? Cervicalgia 01/31/2012  ? Closed fracture of lumbar vertebra without mention of spinal cord injury 07/17/2005  ? Conjunctiva disorder 12/26/2010  ? Coronary atherosclerosis of native coronary artery 07/17/1997  ? Cramp of limb 08/16/2009  ? Degeneration of lumbar or lumbosacral intervertebral disc 01/31/2012  ? Disturbance of skin sensation 08/2009  ? Dizziness and giddiness 11/08/2011  ? vertigo  ? External hemorrhoids without mention of complication 8/33/8250  ? External hemorrhoids without mention of complication 53/97/6734  ? Ganglion of tendon sheath 08/16/2009  ? Leg cramp 10/27/2013  ? Most frequently the right leg.   ? Long term (current) use of anticoagulants 09/2010  ? Lumbago 07/2009  ? Meralgia paresthetica  07/2007  ? MI, old   ? Myalgia and myositis, unspecified 01/17/2012  ? Osteoporosis   ? Other abnormal blood chemistry 04/08/2012  ? Other abnormal blood chemistry 2013  ? hyperglycemia  ? Other disorder of muscle, ligament, and fascia 04/08/2012  ? Other specified cardiac dysrhythmias(427.89) 06/27/2010  ? Pacemaker 05/06/2020  ? Pain in joint,  ankle and foot 10/27/2013  ? Bilateral since 1998   ? Pain in joint, shoulder region 08/12/2012  ? Pain in joint, upper arm 12/26/2010  ? Pain in limb 01/11/2011  ? Pain in thoracic spine 01/31/2012  ? Pathologic fracture of vertebrae 01/22/2012  ? PVC's (premature ventricular contractions)   ? Rash and other nonspecific skin eruption 08/02/2011  ? Senile osteoporosis 07/17/1993  ? Stroke [facility])   ? Unspecified essential hypertension 07/17/1997  ? Varicose veins of lower extremities 08/16/2009  ? Varicose veins of lower extremities 08/16/2009  ? ?Past Surgical History:  ?Procedure Laterality Date  ? COLONOSCOPY WITH PROPOFOL N/A 10/19/2015  ? Procedure: COLONOSCOPY WITH PROPOFOL;  Surgeon: Gatha Mayer, MD;  [facility];  Service: Endoscopy;  Laterality: N/A;  ? CORONARY ARTERY BYPASS GRAFT  1998  ? x2; LIMA to LAD; SVG to diagonal off bypass  ? HEMORRHOID SURGERY  08/26/2012  ? Dr. Brantley Stage  ? LOOP RECORDER INSERTION N/A 05/13/2018  ? Procedure: LOOP RECORDER INSERTION;  Surgeon: [name], MD;  Location: [place] [facility];  Service: Cardiovascular;  Laterality: N/A;  ? LOOP RECORDER REMOVAL N/A 05/26/2019  ? Procedure: LOOP RECORDER REMOVAL;  Surgeon: [name], MD;  Location: [facility] LAB;  Service: Cardiovascular;  Laterality: N/A;  ? MASS EXCISION Left 11/23/2015  ? Procedure: LEFT WRIST EXCISION CYST;  Surgeon: Leanora Cover, MD;  Location: [facility];  Service: Orthopedics;  Laterality: Left;  ? PACEMAKER IMPLANT N/A 05/26/2019  ? Procedure: PACEMAKER IMPLANT;  Surgeon: [name], MD;  Location: [facility];  Service: Cardiovascular;  Laterality: N/A;  ? SKIN BIOPSY  01/29/14  ? (R) neck; (R) scalp, 2 (L) neck; shave biopsy Superficial basal cell carcinoma Dr. Danella Sensing  ? TONSILLECTOMY  1940's  ? ? ?Allergies  ?Allergen Reactions  ? Iodinated Contrast Media Nausea Only and Other (See Comments)  ?  Severe nausea and also passed out  ? Iodine   ? Other   ?  Seasonal  Allergies.  ? Bactrim Rash  ? Relafen [Nabumetone] Rash  ? Sulfanilamide Rash  ? ? ?Outpatient Encounter Medications as of 02/03/2022  ?Medication Sig  ? acetaminophen (TYLENOL) 500 MG tablet Take 1,000 mg by mouth in the morning, at noon, and at bedtime.  ? amLODipine (NORVASC) 5 MG tablet Take 5 mg by mouth daily.  ? aspirin EC 81 MG EC tablet Take 1 tablet (81 mg total) by mouth daily. Swallow whole.  ? atenolol (TENORMIN) 50 MG tablet Take 50 mg by mouth 2 (two) times daily. HOLD FOR SPB <90 or HR< 60  ? azelastine (ASTELIN) 0.1 % nasal spray Place 2 sprays into both nostrils 2 (two) times daily. Use in each nostril as directed  ? calcium-vitamin D (OSCAL WITH D) 500-200 MG-UNIT tablet Take 1 tablet by mouth daily.  ? diclofenac Sodium (VOLTAREN) 1 % GEL Apply 4 g topically 4 (four) times daily as needed. Left knee, right knee, left ankle, back or side  ? digoxin (LANOXIN) 0.125 MG tablet Take 0.125 mg by mouth every other day.  ? docusate sodium (COLACE)  100 MG capsule Take 1 capsule (100 mg total) by mouth daily.  ? fluticasone (FLONASE) 50 MCG/ACT nasal spray Place 1 spray into both nostrils daily.  ? hydrALAZINE (APRESOLINE) 10 MG tablet Take 10 mg by mouth 3 (three) times daily. TID scheduled and tid prn SBP>170  ? hydrochlorothiazide (HYDRODIURIL) 50 MG tablet Take 25 mg by mouth daily.  ? lidocaine (LIDODERM) 5 % Place 1 patch onto the skin daily. Remove & Discard patch within 12 hours or as directed by MD  ? loratadine (CLARITIN) 10 MG tablet Take 10 mg by mouth at bedtime.  ? LORazepam (ATIVAN) 0.5 MG tablet Take 1 tablet (0.5 mg total) by mouth 2 (two) times daily as needed for up to 14 days for anxiety.  ? LORazepam (ATIVAN) 1 MG tablet Take 1 tablet (1 mg total) by mouth at bedtime.  ? methocarbamol (ROBAXIN) 500 MG tablet Take 0.5 tablets (250 mg total) by mouth 2 (two) times daily as needed for muscle spasms.  ? ondansetron (ZOFRAN) 4 MG tablet Take 1 tablet (4 mg total) by mouth every 6 (six) hours  as needed for nausea.  ? polyethylene glycol (MIRALAX / GLYCOLAX) 17 g packet Take 17 g by mouth daily.  ? potassium chloride SA (KLOR-CON) 20 MEQ tablet Take 10 mEq by mouth daily.  ? traMADol [facility])

## 2022-02-06 ENCOUNTER — Non-Acute Institutional Stay (SKILLED_NURSING_FACILITY): Payer: Medicare Other | Admitting: Internal Medicine

## 2022-02-06 ENCOUNTER — Encounter: Payer: Self-pay | Admitting: Internal Medicine

## 2022-02-06 DIAGNOSIS — R410 Disorientation, unspecified: Secondary | ICD-10-CM | POA: Diagnosis not present

## 2022-02-06 DIAGNOSIS — I10 Essential (primary) hypertension: Secondary | ICD-10-CM

## 2022-02-06 DIAGNOSIS — I69354 Hemiplegia and hemiparesis following cerebral infarction affecting left non-dominant side: Secondary | ICD-10-CM

## 2022-02-06 DIAGNOSIS — I4821 Permanent atrial fibrillation: Secondary | ICD-10-CM

## 2022-02-06 DIAGNOSIS — M545 Low back pain, unspecified: Secondary | ICD-10-CM | POA: Diagnosis not present

## 2022-02-06 DIAGNOSIS — K5903 Drug induced constipation: Secondary | ICD-10-CM

## 2022-02-06 DIAGNOSIS — E871 Hypo-osmolality and hyponatremia: Secondary | ICD-10-CM | POA: Diagnosis not present

## 2022-02-06 LAB — FOLATE
Digoxin: 0.59
Folate: 8.5

## 2022-02-06 NOTE — Progress Notes (Signed)
?Location:   [place] ?Nursing Home Room Number: 118 ?Place of Service:  SNF (31) ?Provider:  Veleta Miners MD ? ?[name], MD ? ?Patient Care Team: ?[name], MD as PCP - General (Internal Medicine) ?[name], MD as PCP - Electrophysiology (Cardiology) ?[name], MD as Consulting Physician (Dermatology) ?[name], MD as Consulting Physician (Cardiology) ?Community, [facility] ?Danella Sensing, MD as Consulting Physician (Dermatology) ? ?Extended Emergency Contact Information ?Primary Emergency Contact: [name],Janet ? [place] ?Home Phone: [phone] ?Mobile Phone: [phone] ?Relation: Daughter ?Secondary Emergency Contact: [name],[name] ? [place] ?Home Phone: [phone] ?Relation: Son ? ?Code Status:  DNR ?Goals of care: Advanced Directive information ? ?  02/06/2022  ? 10:04 AM  ?Advanced Directives  ?Does Patient Have a Medical Advance Directive? Yes  ?Type of Paramedic of [place];Living will;Out of facility DNR (pink MOST or yellow form)  ?Does patient want to make changes to medical advance directive? No - Patient declined  ?Copy of [place] in Chart? Yes - validated most recent copy scanned in chart (See row information)  ?Pre-existing out of facility DNR order (yellow form or pink MOST form) Yellow form placed in chart (order not valid for inpatient use);Pink MOST form placed in chart (order not valid for inpatient use)  ? ? ? ?Chief Complaint  ?Patient presents with  ? Acute Visit  ?  Behaviors  ? ? ?HPI:  ?Pt is a 88 y.o. female seen today for an acute visit for follow up of her Behaviors ? ? ?Patient has a history of CAD,  ?thrombocytopenia  ?history of GI bleed on Coumadin and ? 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  With extraperitoneal hemorrhage in 10/21 ?Patient was  off Eliquis before her Embolic stroke due to ruptured Baker's cyst  ? ?Recently patient has been become very Talkative  ?Repeats herself ?Paranoid and gets upset with Staff ?All this is new for her. ?Her labs were all normal. She was taken off hydralazine and her Nasal sprays to rule out medications as cause ?Per her daughter she has been crying recently also and calling her brother to complain about how he was not fair to her ?Patient did says she feels anxious ?Denied Depression but then said she does feel like no one listens to her ?Physically doing well with therapy and moving her Left side better ? ?  ?Past Medical History:  ?Diagnosis Date  ? Actinic keratosis 05/25/2014  ? Anxiety   ? Atrial fibrillation ([place]) 09/21/2010  ? Basal cell carcinoma 05/25/2014  ? Multiple removed by Dr. Danella Sensing in March 2015: right neck, left neck, scalp   ? Bell's palsy 07/18/1979  ? Cervicalgia 01/31/2012  ? Closed fracture of lumbar vertebra without mention of spinal cord injury 07/17/2005  ? Conjunctiva disorder 12/26/2010  ? Coronary atherosclerosis of native coronary artery 07/17/1997  ? Cramp of limb 08/16/2009  ? Degeneration of lumbar or lumbosacral intervertebral disc 01/31/2012  ? Disturbance of skin sensation 08/2009  ? Dizziness and giddiness 11/08/2011  ? vertigo  ? External hemorrhoids without mention of complication 0/16/0109  ? External hemorrhoids without mention of complication 32/35/5732  ? Ganglion of tendon sheath 08/16/2009  ? Leg cramp 10/27/2013  ? Most  frequently the right leg.   ? Long term (current) use of anticoagulants 09/2010  ? Lumbago 07/2009  ? Meralgia paresthetica 07/2007  ? MI, old   ? Myalgia and myositis, unspecified 01/17/2012  ? Osteoporosis   ? Other abnormal blood chemistry 04/08/2012  ? Other abnormal blood chemistry 2013  ? hyperglycemia  ? Other  disorder of muscle, ligament, and fascia 04/08/2012  ? Other specified cardiac dysrhythmias(427.89) 06/27/2010  ? Pacemaker 05/06/2020  ? Pain in joint, ankle and foot 10/27/2013  ? Bilateral since 1998   ? Pain in joint, shoulder region 08/12/2012  ? Pain in joint, upper arm 12/26/2010  ? Pain in limb 01/11/2011  ? Pain in thoracic spine 01/31/2012  ? Pathologic fracture of vertebrae 01/22/2012  ? PVC's (premature ventricular contractions)   ? Rash and other nonspecific skin eruption 08/02/2011  ? Senile osteoporosis 07/17/1993  ? Stroke [facility])   ? Unspecified essential hypertension 07/17/1997  ? Varicose veins of lower extremities 08/16/2009  ? Varicose veins of lower extremities 08/16/2009  ? ?Past Surgical History:  ?Procedure Laterality Date  ? COLONOSCOPY WITH PROPOFOL N/A 10/19/2015  ? Procedure: COLONOSCOPY WITH PROPOFOL;  Surgeon: Gatha Mayer, MD;  Location: [facility];  Service: Endoscopy;  Laterality: N/A;  ? CORONARY ARTERY BYPASS GRAFT  1998  ? x2; LIMA to LAD; SVG to diagonal off bypass  ? HEMORRHOID SURGERY  08/26/2012  ? Dr. Brantley Stage  ? LOOP RECORDER INSERTION N/A 05/13/2018  ? Procedure: LOOP RECORDER INSERTION;  Surgeon: [name], MD;  Location: [place] [facility];  Service: Cardiovascular;  Laterality: N/A;  ? LOOP RECORDER REMOVAL N/A 05/26/2019  ? Procedure: LOOP RECORDER REMOVAL;  Surgeon: [name], MD;  Location: [place] [facility];  Service: Cardiovascular;  Laterality: N/A;  ? MASS EXCISION Left 11/23/2015  ? Procedure: LEFT WRIST EXCISION CYST;  Surgeon: Leanora Cover, MD;  Location: [place];  Service: Orthopedics;  Laterality: Left;  ? PACEMAKER IMPLANT N/A 05/26/2019  ? Procedure: PACEMAKER IMPLANT;  Surgeon: [name], MD;  Location: [place] [facility];  Service: Cardiovascular;  Laterality: N/A;  ? SKIN BIOPSY  01/29/14  ? (R) neck; (R) scalp, 2 (L) neck; shave biopsy Superficial basal cell carcinoma Dr. Danella Sensing  ? TONSILLECTOMY  1940's  ? ? ?Allergies   ?Allergen Reactions  ? Iodinated Contrast Media Nausea Only and Other (See Comments)  ?  Severe nausea and also passed out  ? Iodine   ? Other   ?  Seasonal Allergies.  ? Bactrim Rash  ? Relafen [Nabumetone] Rash  ? Sulfanilamide Rash  ? ? ?Allergies as of 02/06/2022   ? ?   Reactions  ? Iodinated Contrast Media Nausea Only, Other (See Comments)  ? Severe nausea and also passed out  ? Iodine   ? Other   ? Seasonal Allergies.  ? Bactrim Rash  ? Relafen [nabumetone] Rash  ? Sulfanilamide Rash  ? ?  ? ?  ?Medication List  ?  ? ?  ? Accurate as of February 06, 2022 10:05 AM. If you have any questions, ask your nurse or doctor.  ?  ?  ? ?  ? ?STOP taking these medications   ? ?azelastine 0.1 % nasal spray ?Commonly known as: ASTELIN ?Stopped by: [name], MD ?  ?fluticasone 50 MCG/ACT nasal spray ?Commonly known as: FLONASE ?Stopped by: [name], MD ?  ?hydrALAZINE 10 MG tablet ?Commonly known as: APRESOLINE ?Stopped by: Meredith Staggers  Loleta Books, MD ?  ? ?  ? ?TAKE these medications   ? ?acetaminophen 500 MG tablet ?Commonly known as: TYLENOL ?Take 1,000 mg by mouth in the morning, at noon, and at bedtime. ?  ?amLODipine 5 MG tablet ?Commonly known as: NORVASC ?Take 5 mg by mouth daily. ?  ?aspirin 81 MG EC tablet ?Take 1 tablet (81 mg total) by mouth daily. Swallow whole. ?  ?atenolol 50 MG tablet ?Commonly known as: TENORMIN ?Take 50 mg by mouth 2 (two) times daily. HOLD FOR SPB <90 or HR< 60 ?  ?calcium-vitamin D 500-200 MG-UNIT tablet ?Commonly known as: OSCAL WITH D ?Take 1 tablet by mouth daily. ?  ?diclofenac Sodium 1 % Gel ?Commonly known as: VOLTAREN ?Apply 4 g topically 4 (four) times daily as needed. Left knee, right knee, left ankle, back or side ?  ?digoxin 0.125 MG tablet ?Commonly known as: LANOXIN ?Take 0.125 mg by mouth every other day. ?  ?docusate sodium 100 MG capsule ?Commonly known as: Colace ?Take 1 capsule (100 mg total) by mouth daily. ?  ?hydrochlorothiazide 50 MG tablet ?Commonly known as:  HYDRODIURIL ?Take 25 mg by mouth daily. ?  ?lidocaine 5 % ?Commonly known as: LIDODERM ?Place 1 patch onto the skin daily. Remove & Discard patch within 12 hours or as directed by MD ?  ?loratadine 10 MG tablet ?Commonly kno

## 2022-02-08 ENCOUNTER — Ambulatory Visit (INDEPENDENT_AMBULATORY_CARE_PROVIDER_SITE_OTHER): Payer: Medicare Other | Admitting: Adult Health

## 2022-02-08 ENCOUNTER — Encounter: Payer: Self-pay | Admitting: Adult Health

## 2022-02-08 VITALS — BP 131/76 | HR 77

## 2022-02-08 DIAGNOSIS — F688 Other specified disorders of adult personality and behavior: Secondary | ICD-10-CM | POA: Diagnosis not present

## 2022-02-08 DIAGNOSIS — I639 Cerebral infarction, unspecified: Secondary | ICD-10-CM | POA: Diagnosis not present

## 2022-02-08 DIAGNOSIS — R410 Disorientation, unspecified: Secondary | ICD-10-CM | POA: Diagnosis not present

## 2022-02-08 NOTE — Patient Instructions (Addendum)
Obtain MR brain to rule out new stroke that could be contributing to personality changes  ? ?Recommend completing an EEG to rule out any seizure activity although low suspicion  ? ?Ensure follow up with Dr. Casimiro Needle for further evaluation ? ? ? ? ?Follow up with Dr. Leonie Man if symptoms persist if needed  ? ? ? ? ? ?Thank you for coming to see [place] at [facility]. I hope we have been able to provide you high quality care today. ? ?You may receive a patient satisfaction survey over the next few weeks. We would appreciate your feedback and comments so that we may continue to improve ourselves and the health of our patients. ? ? ?

## 2022-02-08 NOTE — Progress Notes (Signed)
?[facility] ?[address] ?[place]. [place] [address] ?[phone] ? ?     OFFICE FOLLOW UP NOTE ? ?[name] ?Date of Birth:  Feb 04, 1929 ?Medical Record Number:  [id number]  ? ?Referring MD: Susan Conway ? ?Reason for Referral: Stroke ? ?Chief Complaint  ?Patient presents with  ? Follow-up  ?  Rm 3 with daughter Susan Conway ?Pt states she is well and stable, daughter has some cognitive concerns   ?  ? ?HPI:  ? ? ?Update 02/08/2022 JM: Patient returns for stroke follow-up after prior visit 1 year ago although requested 29-monthfollow-up at that time.  She is accompanied by her daughter who has other concerns that she would like to be requested today.  [facility] review of [facility] notes, they have been concerned regarding personality and behavior changes becoming very talkative, repeating herself and paranoia since 3/30.  Completed lab work and urine sample which was unremarkable. Concern for acute delirium vs ? Depression, they discontinued hydralazine as this was recently added prior to onset and eventually tramadol due to concern of possible medication side effect.  Hesitant to trial SSRI due to hyponatremia.  Placed on Ativan 0.5 mg twice daily and '1mg'$  qhs with some improvement.  Was referred to Dr. PCasimiro Needle  ? ?Per patient, she feels like she is doing well, she feels happy and feels like she is talking more and being more outgoing as her back pain has been better. She feels like she needs to be more vocal about how she feels and things that need to change at her SNF. Denies feeling sad or anxious but does admit to being more emotional. ? ?Per daughter, concerned that personality has changed, perseverates, provides a lot of details during conversation, more talkative to others around her. There has not been any issues with confusion, dementia, or talking about things that are not true.  ? ?During visit today, she will speak off topic frequently or have flight of  ideas. Denies new weakness, numbness tingling, speech changes, vision changes or changes in balance/gait. Residual left sided weakness post stroke improved since prior visit. Continues working with therapies.  ? ?Per SNF MAR, she has remained on aspirin, denies side effects.  Blood pressure today 131/76.  ? ? ? ? ? ? ? ?History provided for reference purposes only ?Update 02/14/2021 JM: Ms. KGipereturns for 3 month stroke follow-up.  [facility] accompanied by daughter, JMadlyn Conway ? ?Stable from stroke standpoint without new stroke/TIA symptoms ?Reports residual left-sided weakness but improving since prior visit ?[facility] distance with hemiwalker and denies any recent falls ?Denies residual dysphagia and currently tolerating a regular diet ? ?Compliant on aspirin -denies associated side effects ?Blood pressure today 147/73 ? ?She does question if restarting Eliquis would be a possibility for stroke prevention with history of A. Fib -previously deemed not a good AC candidate due to fall risk and advanced age - she has not had f/u with cardiology since stroke d/c ? ?No further concerns at this time ? ? ?Initial visit 11/15/2020 Dr. SLeonie Man Ms. KCapriois a 88year old Caucasian lady seen today for initial office consultation visit for stroke.  She is accompanied by her daughter.  History is obtained from them and review of electronic medical records and personal review of imaging studies.  She has past medical history of hypertension, diabetes, atrial fibrillation was not on anticoagulation  and was admitted recently in October 21 secondary to pelvic fracture with large extraperitoneal hematoma.  While admitted to [facility] long hospital she developed sudden onset neurological change on 08/17/2020 at 1927 when she developed left hemiparesis.  Code stroke was called.  CT scan of the head was obtained which showed no acute hemorrhage.  Aspect score was  10.  Patient has history of IV dye allergy and contrast was not administered.  She is not a tPA candidate due to the large retroperitoneal bleed.  She had an MRI scan of the brain which showed a large right basal ganglia stroke with MRA of the brain showing loss of focal signal loss in the right M2 MCA but preserved distal flow.  NIH stroke scale was 11 on admission.  Echocardiogram showed normal ejection fraction of 60 to 65% hemoglobin A1c was 5.7.  LDL cholesterol was 81.3 mg percent.  Patient was started on aspirin and [facility] and has made very slow progress and she is continuing ongoing physical occupational therapy but is able to walk with one-person assist with hemiwalker.  She has now been transferred to the skilled nursing section.  In fact had a fall this morning and she reached down to pick up something from the floor.  She has a small left scalp hematoma and a bruise in the left frontal region.  The patient is cognitively done well without any deficits but continues to have significant left hemiplegia and can barely bend her fingers in the left hand and move her left shoulder as well as move the left hip only.  She has history of atrial fibrillation but has not been on anticoagulation due to fall risk and advanced age. ? ? ?ROS:   ?14 system review of systems is positive for those listed in HPI and all systems negative ? ? ? ?PMH:  ?Past Medical History:  ?Diagnosis Date  ? Actinic keratosis 05/25/2014  ? Anxiety   ? Atrial fibrillation ([place]) 09/21/2010  ? Basal cell carcinoma 05/25/2014  ? Multiple removed by Dr. Danella Sensing in March 2015: right neck, left neck, scalp   ? Bell's palsy 07/18/1979  ? Cervicalgia 01/31/2012  ? Closed fracture of lumbar vertebra without mention of spinal cord injury 07/17/2005  ? Conjunctiva disorder 12/26/2010  ? Coronary atherosclerosis of native coronary artery 07/17/1997  ? Cramp of limb 08/16/2009  ? Degeneration of lumbar or  lumbosacral intervertebral disc 01/31/2012  ? Disturbance of skin sensation 08/2009  ? Dizziness and giddiness 11/08/2011  ? vertigo  ? External hemorrhoids without mention of complication 9/56/2130  ? External hemorrhoids without mention of complication 86/57/8469  ? Ganglion of tendon sheath 08/16/2009  ? Leg cramp 10/27/2013  ? Most frequently the right leg.   ? Long term (current) use of anticoagulants 09/2010  ? Lumbago 07/2009  ? Meralgia paresthetica 07/2007  ? MI, old   ? Myalgia and myositis, unspecified 01/17/2012  ? Osteoporosis   ? Other abnormal blood chemistry 04/08/2012  ? Other abnormal blood chemistry 2013  ? hyperglycemia  ? Other disorder of muscle, ligament, and fascia 04/08/2012  ? Other specified cardiac dysrhythmias(427.89) 06/27/2010  ? Pacemaker 05/06/2020  ? Pain in joint, ankle and foot 10/27/2013  ? Bilateral since 1998   ? Pain in joint, shoulder region 08/12/2012  ? Pain in joint, upper arm 12/26/2010  ? Pain in limb 01/11/2011  ? Pain in thoracic spine 01/31/2012  ? Pathologic fracture of vertebrae 01/22/2012  ? PVC's (  premature ventricular contractions)   ? Rash and other nonspecific skin eruption 08/02/2011  ? Senile osteoporosis 07/17/1993  ? Stroke [facility])   ? Unspecified essential hypertension 07/17/1997  ? Varicose veins of lower extremities 08/16/2009  ? Varicose veins of lower extremities 08/16/2009  ? ? ?Social History:  ?Social History  ? ?Socioeconomic History  ? Marital status: Widowed  ?  Spouse name: Not on file  ? Number of children: Not on file  ? Years of education: Not on file  ? Highest education level: Not on file  ?Occupational History  ? Occupation: Retired Pharmacist, hospital  ?Tobacco Use  ? Smoking status: Never  ? Smokeless tobacco: Never  ?Vaping Use  ? Vaping Use: Never used  ?Substance and Sexual Activity  ? Alcohol use: Yes  ?  Comment: occasional wine  ? Drug use: No  ? Sexual activity: Never  ?Other Topics Concern  ? Not on file  ?Social History Narrative  ? Living at [facility] since  [facility]  ? Widowed 09/03/2016  ? Never smoked  ? Alcohol occasional wine  ? Exercise, house work   ? DNR , POA, Living Will  ? ?Social Determinants of Health  ? ?Financial Resource Strain: Not on file  ?Food Insecu

## 2022-02-09 NOTE — Progress Notes (Signed)
I agree with the above plan 

## 2022-02-10 DIAGNOSIS — F319 Bipolar disorder, unspecified: Secondary | ICD-10-CM | POA: Diagnosis not present

## 2022-02-14 NOTE — Telephone Encounter (Signed)
Spoke with pts daughter and advised her that the new shoes have come in and she stated that she will come by some time this week to pick them up.

## 2022-02-15 ENCOUNTER — Other Ambulatory Visit: Payer: Medicare Other | Admitting: *Deleted

## 2022-02-15 ENCOUNTER — Telehealth: Payer: Self-pay | Admitting: Adult Health

## 2022-02-15 NOTE — Telephone Encounter (Signed)
Medicare/aetna order sent to GI, they will obtain the auth for aetna and reach out to the patient to schedule.  °

## 2022-02-20 ENCOUNTER — Encounter: Payer: Self-pay | Admitting: Orthopedic Surgery

## 2022-02-20 ENCOUNTER — Non-Acute Institutional Stay (SKILLED_NURSING_FACILITY): Payer: Medicare Other | Admitting: Orthopedic Surgery

## 2022-02-20 DIAGNOSIS — R410 Disorientation, unspecified: Secondary | ICD-10-CM | POA: Diagnosis not present

## 2022-02-20 DIAGNOSIS — I69354 Hemiplegia and hemiparesis following cerebral infarction affecting left non-dominant side: Secondary | ICD-10-CM | POA: Diagnosis not present

## 2022-02-20 DIAGNOSIS — R1013 Epigastric pain: Secondary | ICD-10-CM | POA: Diagnosis not present

## 2022-02-20 DIAGNOSIS — I10 Essential (primary) hypertension: Secondary | ICD-10-CM

## 2022-02-20 MED ORDER — HYDRALAZINE HCL 10 MG PO TABS: 10.0000 mg | ORAL_TABLET | Freq: Two times a day (BID) | ORAL | 0 refills | Status: DC | PRN

## 2022-02-20 NOTE — Progress Notes (Signed)
?Location:  [facility] ?Nursing Home Room Number: 118/A ?Place of Service:  SNF (31) ?Provider: Yvonna Alanis, NP ? ?Patient Care Team: ?[name], MD as PCP - General (Internal Medicine) ?[name], MD as PCP - Electrophysiology (Cardiology) ?[name], MD as Consulting Physician (Dermatology) ?[name], MD as Consulting Physician (Cardiology) ?Community, [facility] ?Danella Sensing, MD as Consulting Physician (Dermatology) ? ?Extended Emergency Contact Information ?Primary Emergency Contact: [name],Janet ? [place] ?Home Phone: [phone] ?Mobile Phone: [phone] ?Relation: Daughter ?Secondary Emergency Contact: [name],[name] ? [place] ?Home Phone: [phone] ?Relation: Son ? ?Code Status: DNR ?Goals of care: Advanced Directive information ? ?  02/20/2022  ?  9:37 AM  ?Advanced Directives  ?Does Patient Have a Medical Advance Directive? Yes  ?Type of Paramedic of [place];Living will;Out of facility DNR (pink MOST or yellow form)  ?Does patient want to make changes to medical advance directive? No - Patient declined  ?Copy of [place] in Chart? Yes - validated most recent copy scanned in chart (See row information)  ?Pre-existing out of facility DNR order (yellow form or pink MOST form) Yellow form placed in chart (order not valid for inpatient use);Pink MOST form placed in chart (order not valid for inpatient use)  ? ? ? ?Chief Complaint  ?Patient presents with  ? Acute Visit  ?  Elevated blood pressure  ? ? ?HPI:  ?Pt is a 88 y.o. female seen today for an acute visit for elevated blood pressure.  ? ? ?Today, nurse reports some SBP > 170. Patient denies chest pain, sob, headaches or blurred vision. BUN/creat 25/1.1 02/02/2022. She remains on amlodipine, metoprolol and HCTZ. See pressures below.  ? ?04/03 she was seen for acute delirium. She was very talkative and paranoid.  Tramadol discontinued, scheduled Ativan started. All labs normal. She was taken off hydralazine and Nasal sprays to r/o medication cause. She was seen by neurology 04/05, MRI/ EEG recommended. Unclear if she has seen Dr. Casimiro Needle. She is doing well this morning. Reports feeling calmer with ativan.  ? ?She was also started on omeprazole x 30 days for epigastric pain. She reports pain has resolved since starting episode.  ? ? ?Recent blood pressures: ? 04/12- 147/76 ? 04/11- 145/80 ? 04/11- 170/90 ? 04/10- 156/79 ? 04/09- 154/83 ? ? ? ?Past Medical History:  ?Diagnosis Date  ? Actinic keratosis 05/25/2014  ? Anxiety   ? Atrial fibrillation ([place]) 09/21/2010  ? Basal cell carcinoma 05/25/2014  ? Multiple removed by Dr. Danella Sensing in March 2015: right neck, left neck, scalp   ? Bell's palsy 07/18/1979  ? Cervicalgia 01/31/2012  ? Closed fracture of lumbar vertebra without mention of spinal cord injury 07/17/2005  ? Conjunctiva disorder 12/26/2010  ? Coronary atherosclerosis of native coronary artery 07/17/1997  ? Cramp of limb 08/16/2009  ? Degeneration of lumbar or lumbosacral intervertebral disc 01/31/2012  ? Disturbance of skin sensation 08/2009  ? Dizziness and giddiness 11/08/2011  ? vertigo  ? External hemorrhoids without mention of complication 6/65/9935  ? External hemorrhoids without mention of complication 70/17/7939  ? Ganglion of tendon sheath 08/16/2009  ? Leg cramp 10/27/2013  ? Most frequently the right leg.   ? Long term (current) use of anticoagulants 09/2010  ? Lumbago 07/2009  ? Meralgia paresthetica 07/2007  ? MI, old   ? Myalgia and myositis, unspecified 01/17/2012  ? Osteoporosis   ? Other abnormal blood chemistry 04/08/2012  ?  Other abnormal blood chemistry 2013  ? hyperglycemia  ? Other disorder of muscle, ligament, and fascia 04/08/2012  ? Other specified cardiac dysrhythmias(427.89) 06/27/2010  ? Pacemaker 05/06/2020  ? Pain in joint, ankle and foot 10/27/2013  ? Bilateral since 1998   ? Pain in joint, shoulder  region 08/12/2012  ? Pain in joint, upper arm 12/26/2010  ? Pain in limb 01/11/2011  ? Pain in thoracic spine 01/31/2012  ? Pathologic fracture of vertebrae 01/22/2012  ? PVC's (premature ventricular contractions)   ? Rash and other nonspecific skin eruption 08/02/2011  ? Senile osteoporosis 07/17/1993  ? Stroke [facility])   ? Unspecified essential hypertension 07/17/1997  ? Varicose veins of lower extremities 08/16/2009  ? Varicose veins of lower extremities 08/16/2009  ? ?Past Surgical History:  ?Procedure Laterality Date  ? COLONOSCOPY WITH PROPOFOL N/A 10/19/2015  ? Procedure: COLONOSCOPY WITH PROPOFOL;  Surgeon: Gatha Mayer, MD;  [facility]: [facility];  Service: Endoscopy;  Laterality: N/A;  ? CORONARY ARTERY BYPASS GRAFT  1998  ? x2; LIMA to LAD; SVG to diagonal off bypass  ? HEMORRHOID SURGERY  08/26/2012  ? Dr. Brantley Stage  ? LOOP RECORDER INSERTION N/A 05/13/2018  ? Procedure: LOOP RECORDER INSERTION;  Surgeon: [name], MD;  Location: [place] [facility];  Service: Cardiovascular;  Laterality: N/A;  ? LOOP RECORDER REMOVAL N/A 05/26/2019  ? Procedure: LOOP RECORDER REMOVAL;  Surgeon: [name], MD;  Location: [place] [facility];  Service: Cardiovascular;  Laterality: N/A;  ? MASS EXCISION Left 11/23/2015  ? Procedure: LEFT WRIST EXCISION CYST;  Surgeon: Leanora Cover, MD;  Location: [place];  Service: Orthopedics;  Laterality: Left;  ? PACEMAKER IMPLANT N/A 05/26/2019  ? Procedure: PACEMAKER IMPLANT;  Surgeon: [name], MD;  Location: [place] [facility];  Service: Cardiovascular;  Laterality: N/A;  ? SKIN BIOPSY  01/29/14  ? (R) neck; (R) scalp, 2 (L) neck; shave biopsy Superficial basal cell carcinoma Dr. Danella Sensing  ? TONSILLECTOMY  1940's  ? ? ?Allergies  ?Allergen Reactions  ? Iodinated Contrast Media Nausea Only and Other (See Comments)  ?  Severe nausea and also passed out  ? Iodine   ? Other   ?  Seasonal Allergies.  ? Bactrim Rash  ? Relafen [Nabumetone] Rash  ? Sulfanilamide Rash   ? ? ?Outpatient Encounter Medications as of 02/20/2022  ?Medication Sig  ? acetaminophen (TYLENOL) 500 MG tablet Take 1,000 mg by mouth in the morning, at noon, and at bedtime.  ? amLODipine (NORVASC) 5 MG tablet Take 5 mg by mouth daily.  ? aspirin EC 81 MG EC tablet Take 1 tablet (81 mg total) by mouth daily. Swallow whole.  ? atenolol (TENORMIN) 50 MG tablet Take 50 mg by mouth 2 (two) times daily. HOLD FOR SPB <90 or HR< 60  ? calcium-vitamin D (OSCAL WITH D) 500-200 MG-UNIT tablet Take 1 tablet by mouth daily.  ? diclofenac Sodium (VOLTAREN) 1 % GEL Apply 4 g topically 4 (four) times daily as needed. Left knee, right knee, left ankle, back or side  ? digoxin (LANOXIN) 0.125 MG tablet Take 0.125 mg by mouth every other day.  ? docusate sodium (COLACE) 100 MG capsule Take 1 capsule (100 mg total) by mouth daily.  ? hydrochlorothiazide (HYDRODIURIL) 50 MG tablet Take 25 mg by mouth daily.  ? lidocaine (LIDODERM) 5 % Place 1 patch onto the skin daily. Remove & Discard patch within 12 hours or as directed by MD  ?  loratadine (CLARITIN) 10 MG tablet Take 10 mg by mouth at bedtime.  ? LORazepam (ATIVAN) 0.5 MG tablet Take 0.5 mg by mouth in the morning and at bedtime.  ? LORazepam (ATIVAN) 1 MG tablet Take 1 tablet (1 mg total) by mouth at bedtime.  ? methocarbamol (ROBAXIN) 500 MG tablet Take 0.5 tablets (250 mg total) by mouth 2 (two) times daily as needed for muscle spasms.  ? omeprazole (PRILOSEC) 40 MG capsule Take 40 mg by mouth daily.  ? ondansetron (ZOFRAN) 4 MG tablet Take 1 tablet (4 mg total) by mouth every 6 (six) hours as needed for nausea.  ? polyethylene glycol (MIRALAX / GLYCOLAX) 17 g packet Take 17 g by mouth daily.  ? potassium chloride SA (KLOR-CON) 20 MEQ tablet Take 10 mEq by mouth daily.  ? ?No facility-administered encounter medications on file as of 02/20/2022.  ? ? ?Review of Systems  ?Constitutional:  Negative for activity change, appetite change, chills, fatigue and fever.  ?HENT:  Positive  for hearing loss and trouble swallowing.   ?Eyes:  Negative for visual disturbance.  ?Respiratory:  Negative for cough, shortness of breath and wheezing.   ?Cardiovascular:  Negative for chest pain and leg

## 2022-02-23 ENCOUNTER — Ambulatory Visit: Payer: Medicare Other | Admitting: Podiatry

## 2022-03-03 ENCOUNTER — Ambulatory Visit (INDEPENDENT_AMBULATORY_CARE_PROVIDER_SITE_OTHER): Payer: Medicare Other

## 2022-03-03 DIAGNOSIS — I442 Atrioventricular block, complete: Secondary | ICD-10-CM | POA: Diagnosis not present

## 2022-03-03 LAB — CUP PACEART REMOTE DEVICE CHECK
Battery Remaining Longevity: 115 mo
Battery Remaining Percentage: 83 %
Battery Voltage: 3.02 V
Brady Statistic RV Percent Paced: 9.8 %
Date Time Interrogation Session: 20230428020015
Implantable Lead Implant Date: 20200720
Implantable Lead Location: 753860
Implantable Pulse Generator Implant Date: 20200720
Lead Channel Impedance Value: 540 Ohm
Lead Channel Pacing Threshold Amplitude: 1 V
Lead Channel Pacing Threshold Pulse Width: 0.5 ms
Lead Channel Sensing Intrinsic Amplitude: 4.2 mV
Lead Channel Setting Pacing Amplitude: 2.5 V
Lead Channel Setting Pacing Pulse Width: 0.5 ms
Lead Channel Setting Sensing Sensitivity: 2 mV
Pulse Gen Model: 1272
Pulse Gen Serial Number: 9122450

## 2022-03-14 ENCOUNTER — Encounter: Payer: Self-pay | Admitting: Orthopedic Surgery

## 2022-03-14 ENCOUNTER — Non-Acute Institutional Stay (SKILLED_NURSING_FACILITY): Payer: Medicare Other | Admitting: Orthopedic Surgery

## 2022-03-14 DIAGNOSIS — I69354 Hemiplegia and hemiparesis following cerebral infarction affecting left non-dominant side: Secondary | ICD-10-CM | POA: Diagnosis not present

## 2022-03-14 DIAGNOSIS — I4821 Permanent atrial fibrillation: Secondary | ICD-10-CM

## 2022-03-14 DIAGNOSIS — I4430 Unspecified atrioventricular block: Secondary | ICD-10-CM | POA: Diagnosis not present

## 2022-03-14 DIAGNOSIS — R1013 Epigastric pain: Secondary | ICD-10-CM | POA: Diagnosis not present

## 2022-03-14 DIAGNOSIS — D696 Thrombocytopenia, unspecified: Secondary | ICD-10-CM

## 2022-03-14 DIAGNOSIS — J302 Other seasonal allergic rhinitis: Secondary | ICD-10-CM

## 2022-03-14 DIAGNOSIS — I10 Essential (primary) hypertension: Secondary | ICD-10-CM | POA: Diagnosis not present

## 2022-03-14 DIAGNOSIS — R410 Disorientation, unspecified: Secondary | ICD-10-CM | POA: Diagnosis not present

## 2022-03-14 DIAGNOSIS — K5903 Drug induced constipation: Secondary | ICD-10-CM | POA: Diagnosis not present

## 2022-03-14 NOTE — Progress Notes (Signed)
?Location:  [place] ?Nursing Home Room Number: 118/A ?Place of Service:  SNF (31) ?Provider: Yvonna Alanis, NP ? ?Patient Care Team: ?[name], MD as PCP - General (Internal Medicine) ?[name], MD as PCP - Electrophysiology (Cardiology) ?[name], MD as Consulting Physician (Dermatology) ?[name], MD as Consulting Physician (Cardiology) ?Community, [facility] ?Danella Sensing, MD as Consulting Physician (Dermatology) ? ?Extended Emergency Contact Information ?Primary Emergency Contact: [name],Janet ? [place] ?Home Phone: [phone] ?Mobile Phone: [phone] ?Relation: Daughter ?Secondary Emergency Contact: [name],[name] ? [place] ?Home Phone: [phone] ?Relation: Son ? ?Code Status:  DNR ?Goals of care: Advanced Directive information ? ?  03/14/2022  ? 10:34 AM  ?Advanced Directives  ?Does Patient Have a Medical Advance Directive? Yes  ?Type of Paramedic of [place];Living will;Out of facility DNR (pink MOST or yellow form)  ?Does patient want to make changes to medical advance directive? No - Patient declined  ?Copy of [place] in Chart? Yes - validated most recent copy scanned in chart (See row information)  ?Pre-existing out of facility DNR order (yellow form or pink MOST form) Yellow form placed in chart (order not valid for inpatient use);Pink MOST form placed in chart (order not valid for inpatient use)  ? ? ? ?Chief Complaint  ?Patient presents with  ? Medical Management of Chronic Issues  ?  Routine visit  ? Quality Metric Gaps  ?  Discuss the need for TDAP or post pone if patient refuses.   ? ? ?HPI:  ?Pt is a 88 y.o. female seen today for medical management of chronic diseases.   ? ?She currently resides on the skilled nursing unit at [facility]. Past medical history includes: atrial fibrillation, AV block, CAD, hypertension, GERD, stroke, left sided paralysis,  constipation, and dysphagia.  ? ?Left sided weakness- CVA (right basal ganglia embolic stroke) 32/6712, lives in skilled nursing, ambulates with PWC, remains on asa (h/o GI bleed on coumadin and ruptured Bakers cyst on Eliquis) , not on statin ?Acute Delirium- 04/03 she was very talkative and paranoid, ativan was increased to BID (now qhs), hydralazine and nasal sprays discontinued, no recent episodes ?HTN- BUN/creat 25/1.1 02/02/2022, remains on metoprolol and HCTZ, Hydralazine prn for SBP > 170 ?Atrial fibrillation- HR controlled with Digoxin- (dig level 0.56 06/2021), remains on asa for clot prevention ?AV block- s/p pacemaker, last check 05/01 ?Epigastric discomfort- started 04/03 due to abdominal pain- recently discontinued, denies pain today ?Thrombocytopenia- platelets 138 02/02/2022 ?Constipation- LBM 05/08, remains on colace and miralax ?Seasonal allergies- remains on Claritin ? ?No recent falls or injuries. Ambulates with PWC.  ? ?Recent blood pressures: ? 05/08- 160/83, 141/74 ? 05/06- 148/53 ? ?Recent weights: ? 05/01- not recorded ? 04/05- 128.5 lbs ? 03/02- 127.2 lbs ? 02/01- 128.6 lbs ? ? ? ? ? ?Past Medical History:  ?Diagnosis Date  ? Actinic keratosis 05/25/2014  ? Anxiety   ? Atrial fibrillation ([place]) 09/21/2010  ? Basal cell carcinoma 05/25/2014  ? Multiple removed by Dr. Danella Sensing in March 2015: right neck, left neck, scalp   ? Bell's palsy 07/18/1979  ? Cervicalgia 01/31/2012  ? Closed fracture of lumbar vertebra without mention of spinal cord injury 07/17/2005  ? Conjunctiva disorder 12/26/2010  ? Coronary atherosclerosis of native coronary artery 07/17/1997  ? Cramp of limb 08/16/2009  ? Degeneration of lumbar or lumbosacral intervertebral disc 01/31/2012  ? Disturbance of skin sensation 08/2009  ?  Dizziness and giddiness 11/08/2011  ? vertigo  ? External hemorrhoids without mention of complication 06/14/9832  ? External hemorrhoids without mention of complication 82/50/5397  ? Ganglion of tendon  sheath 08/16/2009  ? Leg cramp 10/27/2013  ? Most frequently the right leg.   ? Long term (current) use of anticoagulants 09/2010  ? Lumbago 07/2009  ? Meralgia paresthetica 07/2007  ? MI, old   ? Myalgia and myositis, unspecified 01/17/2012  ? Osteoporosis   ? Other abnormal blood chemistry 04/08/2012  ? Other abnormal blood chemistry 2013  ? hyperglycemia  ? Other disorder of muscle, ligament, and fascia 04/08/2012  ? Other specified cardiac dysrhythmias(427.89) 06/27/2010  ? Pacemaker 05/06/2020  ? Pain in joint, ankle and foot 10/27/2013  ? Bilateral since 1998   ? Pain in joint, shoulder region 08/12/2012  ? Pain in joint, upper arm 12/26/2010  ? Pain in limb 01/11/2011  ? Pain in thoracic spine 01/31/2012  ? Pathologic fracture of vertebrae 01/22/2012  ? PVC's (premature ventricular contractions)   ? Rash and other nonspecific skin eruption 08/02/2011  ? Senile osteoporosis 07/17/1993  ? Stroke [facility])   ? Unspecified essential hypertension 07/17/1997  ? Varicose veins of lower extremities 08/16/2009  ? Varicose veins of lower extremities 08/16/2009  ? ?Past Surgical History:  ?Procedure Laterality Date  ? COLONOSCOPY WITH PROPOFOL N/A 10/19/2015  ? Procedure: COLONOSCOPY WITH PROPOFOL;  Surgeon: Gatha Mayer, MD;  [facility];  Service: Endoscopy;  Laterality: N/A;  ? CORONARY ARTERY BYPASS GRAFT  1998  ? x2; LIMA to LAD; SVG to diagonal off bypass  ? HEMORRHOID SURGERY  08/26/2012  ? Dr. Brantley Stage  ? LOOP RECORDER INSERTION N/A 05/13/2018  ? Procedure: LOOP RECORDER INSERTION;  Surgeon: [name], MD;  Location: [place] [facility];  Service: Cardiovascular;  Laterality: N/A;  ? LOOP RECORDER REMOVAL N/A 05/26/2019  ? Procedure: LOOP RECORDER REMOVAL;  Surgeon: [name], MD;  Location: [place] [facility];  Service: Cardiovascular;  Laterality: N/A;  ? MASS EXCISION Left 11/23/2015  ? Procedure: LEFT WRIST EXCISION CYST;  Surgeon: Leanora Cover, MD;  Location: [place];  Service: Orthopedics;   Laterality: Left;  ? PACEMAKER IMPLANT N/A 05/26/2019  ? Procedure: PACEMAKER IMPLANT;  Surgeon: [name], MD;  Location: [place] [facility];  Service: Cardiovascular;  Laterality: N/A;  ? SKIN BIOPSY  01/29/14  ? (R) neck; (R) scalp, 2 (L) neck; shave biopsy Superficial basal cell carcinoma Dr. Danella Sensing  ? TONSILLECTOMY  1940's  ? ? ?Allergies  ?Allergen Reactions  ? Iodinated Contrast Media Nausea Only and Other (See Comments)  ?  Severe nausea and also passed out  ? Iodine   ? Other   ?  Seasonal Allergies.  ? Bactrim Rash  ? Relafen [Nabumetone] Rash  ? Sulfanilamide Rash  ? ? ?Outpatient Encounter Medications as of 03/14/2022  ?Medication Sig  ? acetaminophen (TYLENOL) 500 MG tablet Take 1,000 mg by mouth in the morning, at noon, and at bedtime.  ? amLODipine (NORVASC) 5 MG tablet Take 5 mg by mouth daily.  ? aspirin EC 81 MG EC tablet Take 1 tablet (81 mg total) by mouth daily. Swallow whole.  ? atenolol (TENORMIN) 50 MG tablet Take 50 mg by mouth 2 (two) times daily. HOLD FOR SPB <90 or HR< 60  ? calcium-vitamin D (OSCAL WITH D) 500-200 MG-UNIT tablet Take 1 tablet by mouth daily.  ? diclofenac Sodium (VOLTAREN) 1 % GEL Apply  4 g topically 4 (four) times daily as needed. Left knee, right knee, left ankle, back or side  ? digoxin (LANOXIN) 0.125 MG tablet Take 0.125 mg by mouth every other day.  ? docusate sodium (COLACE) 100 MG capsule Take 1 capsule (100 mg total) by mouth daily.  ? hydrALAZINE (APRESOLINE) 10 MG tablet Take 1 tablet (10 mg total) by mouth 2 (two) times daily as needed (for SBP> 170).  ? hydrochlorothiazide (HYDRODIURIL) 50 MG tablet Take 25 mg by mouth daily.  ? lidocaine (LIDODERM) 5 % Place 1 patch onto the skin daily. Remove & Discard patch within 12 hours or as directed by MD  ? loratadine (CLARITIN) 10 MG tablet Take 10 mg by mouth at bedtime.  ? LORazepam (ATIVAN) 1 MG tablet Take 1 tablet (1 mg total) by mouth at bedtime.  ? methocarbamol (ROBAXIN) 500 MG tablet Take 0.5  tablets (250 mg total) by mouth 2 (two) times daily as needed for muscle spasms.  ? ondansetron (ZOFRAN) 4 MG tablet Take 1 tablet (4 mg total) by mouth every 6 (six) hours as needed for nausea.  ? polyethylene

## 2022-03-16 ENCOUNTER — Ambulatory Visit: Payer: Medicare Other | Admitting: Adult Health

## 2022-03-20 ENCOUNTER — Other Ambulatory Visit: Payer: Self-pay | Admitting: Adult Health

## 2022-03-20 DIAGNOSIS — F419 Anxiety disorder, unspecified: Secondary | ICD-10-CM

## 2022-03-20 MED ORDER — LORAZEPAM 1 MG PO TABS: 1.0000 mg | ORAL_TABLET | Freq: Every day | ORAL | 5 refills | Status: DC

## 2022-03-20 NOTE — Progress Notes (Signed)
Remote pacemaker transmission.   

## 2022-03-21 ENCOUNTER — Ambulatory Visit: Payer: Medicare Other | Admitting: Podiatry

## 2022-03-30 ENCOUNTER — Encounter: Payer: Self-pay | Admitting: Adult Health

## 2022-03-30 ENCOUNTER — Non-Acute Institutional Stay (SKILLED_NURSING_FACILITY): Payer: Medicare Other | Admitting: Adult Health

## 2022-03-30 ENCOUNTER — Telehealth: Payer: Self-pay

## 2022-03-30 DIAGNOSIS — M545 Low back pain, unspecified: Secondary | ICD-10-CM

## 2022-03-30 NOTE — Progress Notes (Signed)
Location:  [place] Room Number: 118-A Place of Service:  SNF [phone]) Provider:  Earney Hamburg, MD  Patient Care Team: [name], MD as PCP - General (Internal Medicine) [name], MD as PCP - Electrophysiology (Cardiology) [name], MD as Consulting Physician (Dermatology) [name], MD as Consulting Physician (Cardiology) [facility] [name], MD as Consulting Physician (Dermatology)  Extended Emergency Contact Information Primary Emergency Contact: [name] of [place] Phone: [phone] Mobile Phone: [phone] Relation: Daughter Secondary Emergency Contact: [name],[name]  [name] of [place] Phone: [phone] Relation: Son  Code Status:  DNR Goals of care: Advanced Directive information    03/30/2022    2:21 PM  Advanced Directives  Does Patient Have a Medical Advance Directive? Yes  Type of Paramedic of [place];Living will;Out of facility DNR (pink MOST or yellow form)  Does patient want to make changes to medical advance directive? No - Patient declined  Copy of Stamps in Chart? Yes - validated most recent copy scanned in chart (See row information)  Pre-existing out of facility DNR order (yellow form or pink MOST form) Pink MOST/Yellow Form most recent copy in chart - Physician notified to receive inpatient order     Chief Complaint  Patient presents with   Acute Visit    Back pain    HPI:  Pt is a 88 y.o. female seen today for an acute visit for back pain. She had lumbar back pain, left sided in Feb/March of 2023. This resolved after tylenol,ultram, and robaxin.  She has a hx of CVA with left sided weakness. Continues to work with [employer] for exercise. She has not had an injury or fall. Uses a stand up lift for transfers.  The pain she is experiencing is in the low back  and right hip area. She has tried tylenol and voltaren with minimal relief. She is not getting in the recliner or bed each afternoon to allow for extension as previous recommended. Denies any radicular pain. Denies numbness or tingling. Denies change in bowel or bladder habits.   A lumbar/sacral xray was ordered on 2/2 which showed T 12 compression fracture age indeterminate and L1 burst fracture age indeterminate.  Past Medical History:  Diagnosis Date   Actinic keratosis 05/25/2014   Anxiety    Atrial fibrillation ([place]) 09/21/2010   Basal cell carcinoma 05/25/2014   Multiple removed by Dr. Danella Sensing in March 2015: right neck, left neck, scalp    Bell's palsy 07/18/1979   Cervicalgia 01/31/2012   Closed fracture of lumbar vertebra without mention of spinal cord injury 07/17/2005   Conjunctiva disorder 12/26/2010   Coronary atherosclerosis of native coronary artery 07/17/1997   Cramp of limb 08/16/2009   Degeneration of lumbar or lumbosacral intervertebral disc 01/31/2012   Disturbance of skin sensation 08/2009   Dizziness and giddiness 11/08/2011   vertigo   External hemorrhoids without mention of complication 3/64/6803   External hemorrhoids without mention of complication 21/22/4825   Ganglion of tendon sheath 08/16/2009   Leg cramp 10/27/2013   Most frequently the right leg.    Long term (current) use of anticoagulants 09/2010   Lumbago 07/2009   Meralgia paresthetica 07/2007   MI, old    Myalgia and myositis, unspecified 01/17/2012   Osteoporosis    Other abnormal blood chemistry 04/08/2012   Other abnormal blood chemistry 2013   hyperglycemia  Other disorder of muscle, ligament, and fascia 04/08/2012   Other specified cardiac dysrhythmias(427.89) 06/27/2010   Pacemaker 05/06/2020   Pain in joint, ankle and foot 10/27/2013   Bilateral since 1998    Pain in joint, shoulder region 08/12/2012   Pain in joint, upper arm 12/26/2010   Pain in limb 01/11/2011   Pain in thoracic spine 01/31/2012    Pathologic fracture of vertebrae 01/22/2012   PVC's (premature ventricular contractions)    Rash and other nonspecific skin eruption 08/02/2011   Senile osteoporosis 07/17/1993   Stroke ([place])    Unspecified essential hypertension 07/17/1997   Varicose veins of lower extremities 08/16/2009   Varicose veins of lower extremities 08/16/2009   Past Surgical History:  Procedure Laterality Date   COLONOSCOPY WITH PROPOFOL N/A 10/19/2015   Procedure: COLONOSCOPY WITH PROPOFOL;  Surgeon: Gatha Mayer, MD;  Location: [facility];  Service: Endoscopy;  Laterality: N/A;   CORONARY ARTERY BYPASS GRAFT  1998   x2; LIMA to LAD; SVG to diagonal off bypass   HEMORRHOID SURGERY  08/26/2012   Dr. Brantley Stage   LOOP RECORDER INSERTION N/A 05/13/2018   Procedure: LOOP RECORDER INSERTION;  Surgeon: [name], MD;  Location: [facility];  Service: Cardiovascular;  Laterality: N/A;   LOOP RECORDER REMOVAL N/A 05/26/2019   Procedure: LOOP RECORDER REMOVAL;  Surgeon: [name], MD;  Location: [facility];  Service: Cardiovascular;  Laterality: N/A;   MASS EXCISION Left 11/23/2015   Procedure: LEFT WRIST EXCISION CYST;  Surgeon: Leanora Cover, MD;  Location: [facility];  Service: Orthopedics;  Laterality: Left;   PACEMAKER IMPLANT N/A 05/26/2019   Procedure: PACEMAKER IMPLANT;  Surgeon: [name], MD;  Location: [facility];  Service: Cardiovascular;  Laterality: N/A;   SKIN BIOPSY  01/29/14   (R) neck; (R) scalp, 2 (L) neck; shave biopsy Superficial basal cell carcinoma Dr. Danella Sensing   TONSILLECTOMY  [id number]    Allergies  Allergen Reactions   Iodinated Contrast Media Nausea Only and Other (See Comments)    Severe nausea and also passed out   Iodine    Other     Seasonal Allergies.   Bactrim Rash   Relafen [Nabumetone] Rash   Sulfanilamide Rash    Outpatient Encounter Medications as of 03/30/2022  Medication Sig   acetaminophen (TYLENOL) 325 MG tablet  Take 650 mg by mouth every 8 (eight) hours as needed.   amLODipine (NORVASC) 5 MG tablet Take 5 mg by mouth daily.   aspirin EC 81 MG EC tablet Take 1 tablet (81 mg total) by mouth daily. Swallow whole.   atenolol (TENORMIN) 50 MG tablet Take 50 mg by mouth 2 (two) times daily. HOLD FOR SPB <90 or HR< 60   calcium-vitamin D (OSCAL WITH D) 500-200 MG-UNIT tablet Take 1 tablet by mouth daily.   diclofenac Sodium (VOLTAREN) 1 % GEL Apply 4 g topically 4 (four) times daily as needed. Left knee, right knee, left ankle, back or side   digoxin (LANOXIN) 0.125 MG tablet Take 0.125 mg by mouth every other day.   docusate sodium (COLACE) 100 MG capsule Take 1 capsule (100 mg total) by mouth daily.   hydrALAZINE (APRESOLINE) 10 MG tablet Take 1 tablet (10 mg total) by mouth 2 (two) times daily as needed (for SBP> 170).   hydrochlorothiazide (HYDRODIURIL) 50 MG tablet Take 25 mg by mouth daily.   lidocaine (LIDODERM) 5 % Place 1 patch onto the skin daily.  Remove & Discard patch within 12 hours or as directed by MD   LORazepam (ATIVAN) 1 MG tablet Take 1 tablet (1 mg total) by mouth at bedtime.   methocarbamol (ROBAXIN) 500 MG tablet Take 0.5 tablets (250 mg total) by mouth 2 (two) times daily as needed for muscle spasms.   ondansetron (ZOFRAN) 4 MG tablet Take 1 tablet (4 mg total) by mouth every 6 (six) hours as needed for nausea.   polyethylene glycol (MIRALAX / GLYCOLAX) 17 g packet Take 17 g by mouth daily.   potassium chloride SA (KLOR-CON) 20 MEQ tablet Take 10 mEq by mouth daily.   [DISCONTINUED] acetaminophen (TYLENOL) 500 MG tablet Take 1,000 mg by mouth in the morning, at noon, and at bedtime.   [DISCONTINUED] loratadine (CLARITIN) 10 MG tablet Take 10 mg by mouth at bedtime.   No facility-administered encounter medications on file as of 03/30/2022.    Review of Systems  Constitutional:  Negative for activity change, appetite change, chills, diaphoresis, fatigue, fever and unexpected weight  change.  HENT:  Positive for congestion.   Respiratory:  Negative for cough, shortness of breath and wheezing.   Cardiovascular:  Negative for chest pain, palpitations and leg swelling.  Gastrointestinal:  Negative for abdominal distention, abdominal pain, constipation and diarrhea.  Genitourinary:  Negative for difficulty urinating and dysuria.  Musculoskeletal:  Positive for back pain and gait problem. Negative for arthralgias, joint swelling and myalgias.  Neurological:  Positive for weakness. Negative for dizziness, tremors, seizures, syncope, facial asymmetry, speech difficulty, light-headedness, numbness and headaches.  Psychiatric/Behavioral:  Negative for agitation, behavioral problems and confusion.    Immunization History  Administered Date(s) Administered   Influenza Whole 08/06/2012   Influenza, High Dose Seasonal PF 08/15/2019, 08/27/2020   Influenza,inj,Quad PF,6+ Mos 08/30/2018   Influenza-Unspecified 08/06/2013, 08/24/2014, 08/26/2015, 08/31/2016, 08/27/2017, 08/27/2020   Moderna Sars-Covid-2 Vaccination 11/16/2019, 12/16/2019, 09/16/2020   Pneumococcal Conjugate-13 09/22/2015   Pneumococcal Polysaccharide-23 08/29/2006   Td 10/07/2007   Zoster Recombinat (Shingrix) 02/12/2018, 05/13/2018   Zoster, Live 03/12/2008   Pertinent  Health Maintenance Due  Topic Date Due   INFLUENZA VACCINE  06/06/2022   DEXA SCAN  Completed      08/23/2020    9:56 AM 08/23/2020   10:33 PM 08/26/2020    6:15 AM 09/23/2020   11:32 AM 09/27/2021   10:28 AM  Fall Risk  Falls in the past year?   1 1 0  Was there an injury with Fall?   1 1 0  Was there an injury with Fall? - Comments    Patient fractured pelvis and had a stroke   Fall Risk Category Calculator   2 2 0  Fall Risk Category   Moderate Moderate Low  Patient Fall Risk Level High fall risk High fall risk  Moderate fall risk Low fall risk  Patient at Risk for Falls Due to   History of fall(s);Impaired balance/gait;Impaired  mobility;Orthopedic patient  No Fall Risks  Fall risk Follow up   Falls evaluation completed;Education provided;Falls prevention discussed;Follow up appointment  Falls evaluation completed   Functional Status Survey:    Vitals:   03/30/22 1418  BP: (!) 156/80  Pulse: 80  Resp: 18  Temp: (!) 97 F (36.1 C)  SpO2: 99%  Weight: 128 lb 8 oz (58.3 kg)  Height: '5\' 6"'$  (1.676 m)   Body mass index is 20.74 kg/m. Physical Exam Vitals and nursing note reviewed.  Cardiovascular:     Rate and Rhythm: Normal rate. Rhythm  irregular.  Pulmonary:     Effort: Pulmonary effort is normal.     Breath sounds: Normal breath sounds.  Musculoskeletal:        General: No swelling, tenderness, deformity or signs of injury.     Right lower leg: No edema.     Left lower leg: No edema.     Comments: Neg SLR Strength 4/5 to RLE 3/5 LLE No pain with hip rotation No pain with right knee flex/extension. No crepitus.   Skin:    General: Skin is warm and dry.  Neurological:     Mental Status: Mental status is at baseline.  Psychiatric:        Mood and Affect: Mood normal.    Labs reviewed: Recent Labs    09/26/21 0000 01/13/22 0000 02/02/22 0000  NA 141 135* 135*  K 3.8 4.2 4.1  CL 100 93* 93*  CO2 29* 33* 23*  BUN 25* 22* 25*  CREATININE 1.0 1.1 1.1  CALCIUM 9.4 9.3 10.0   Recent Labs    08/16/21 0000 09/26/21 0000  AST 30 15  ALT 23 8  ALKPHOS 87 90  ALBUMIN 3.1* 3.6   Recent Labs    09/26/21 0000 01/13/22 0000 02/02/22 0000  WBC 6.7 6.2 7.4  HGB 11.5* 10.4* 10.8*  HCT 36 31* 32*  PLT 125* 143* 138*   Lab Results  Component Value Date   TSH 2.87 02/03/2022   Lab Results  Component Value Date   HGBA1C 5.7 (H) 08/19/2020   Lab Results  Component Value Date   CHOL 180 09/26/2021   HDL 44 09/26/2021   LDLCALC 122 09/26/2021   LDLDIRECT 81.3 08/19/2020   TRIG 70 09/26/2021   CHOLHDL 2 09/12/2010    Significant Diagnostic Results in last 30 days:  CUP PACEART  REMOTE DEVICE CHECK  Result Date: 03/03/2022 Scheduled remote reviewed. Normal device function.  VS<2:1, 3-39m trend Next remote 91 days. LA   Assessment/Plan  1. Acute right-sided low back pain without sciatica No red flag signs Would not be a good candidate for prednisone due to recent delirium, not a good candidate for nsaids Will schedule Robaxin 500 mg each day for two weeks Recommend [facility] If no improvement refer to ortho   Family/ staff Communication: discussed with resident and her daughter JNetta Neatordered:  NA

## 2022-03-30 NOTE — Telephone Encounter (Signed)
Called and spoke with daughter letting her know we got her message and are investigating regarding Susan Conway's shoes. Will be reaching back out early next week. Daughter was satisfied.

## 2022-04-14 ENCOUNTER — Telehealth: Payer: Self-pay | Admitting: Adult Health

## 2022-04-14 DIAGNOSIS — M545 Low back pain, unspecified: Secondary | ICD-10-CM

## 2022-04-14 MED ORDER — TRAMADOL HCL 50 MG PO TABS: 50.0000 mg | ORAL_TABLET | Freq: Four times a day (QID) | ORAL | 0 refills | Status: DC | PRN

## 2022-04-14 NOTE — Telephone Encounter (Signed)
Discussed care of Susan Conway with Susan Conway at [facility]. Susan Conway's back is still hurting. She did not eat lunch today. I had already written an order for an ortho referral if things were not getting better for further eval/testing.  I reiterated that we need to follow through on this. Her daughter is requesting labs for cancer markers. She also wanted another back xray. She said the last one hurt her. I did not want to expose her to another xray but would rather see ortho and have a possible MRI.  I did not want to prescribe prednisone at this time due to her recent bout of delirium. I ordered labs per her daughter's request. We reinstated an order for ultram for pain until she can get in with ortho.

## 2022-04-17 ENCOUNTER — Encounter: Payer: Self-pay | Admitting: Internal Medicine

## 2022-04-17 ENCOUNTER — Non-Acute Institutional Stay (SKILLED_NURSING_FACILITY): Payer: Medicare Other | Admitting: Internal Medicine

## 2022-04-17 DIAGNOSIS — G8929 Other chronic pain: Secondary | ICD-10-CM | POA: Diagnosis not present

## 2022-04-17 DIAGNOSIS — Z8781 Personal history of (healed) traumatic fracture: Secondary | ICD-10-CM | POA: Diagnosis not present

## 2022-04-17 DIAGNOSIS — M5441 Lumbago with sciatica, right side: Secondary | ICD-10-CM | POA: Diagnosis not present

## 2022-04-17 DIAGNOSIS — I10 Essential (primary) hypertension: Secondary | ICD-10-CM | POA: Diagnosis not present

## 2022-04-17 DIAGNOSIS — I69354 Hemiplegia and hemiparesis following cerebral infarction affecting left non-dominant side: Secondary | ICD-10-CM

## 2022-04-17 NOTE — Progress Notes (Signed)
Location:   [place] Room Number: 118 Place of Service:  SNF [phone]) Provider:  Veleta Miners MD  [name], MD  Patient Care Team: [name], MD as PCP - General (Internal Medicine) [name], MD as PCP - Electrophysiology (Cardiology) [name], MD as Consulting Physician (Dermatology) [name], MD as Consulting Physician (Cardiology) [facility] [name], MD as Consulting Physician (Dermatology)  Extended Emergency Contact Information Primary Emergency Contact: [name] of [place] Phone: [phone] Mobile Phone: [phone] Relation: Daughter Secondary Emergency Contact: [name],[name]  [name] of [place] Phone: [phone] Relation: Son  Code Status:   Goals of care: Advanced Directive information    04/17/2022    3:24 PM  Advanced Directives  Does Patient Have a Medical Advance Directive? Yes  Type of Paramedic of [place];Living will;Out of facility DNR (pink MOST or yellow form)  Does patient want to make changes to medical advance directive? No - Patient declined  Copy of [place] in Chart? Yes - validated most recent copy scanned in chart (See row information)  Pre-existing out of facility DNR order (yellow form or pink MOST form) Pink MOST/Yellow Form most recent copy in chart - Physician notified to receive inpatient order     Chief Complaint  Patient presents with   Acute Visit    HPI:  Pt is a 88 y.o. 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 Also when they are doing transfers Pain in Lower Thoracic region and radiating down her Right leg Does not want any more imaging or go to see Ortho  Wt Readings from Last 3 Encounters:  04/17/22 130 lb (59 kg)  03/30/22 128 lb 8 oz (58.3 kg)  03/14/22 128 lb 8 oz (58.3 kg)    Past Medical History:  Diagnosis Date   Actinic keratosis 05/25/2014   Anxiety    Atrial fibrillation ([place]) 09/21/2010   Basal cell carcinoma 05/25/2014   Multiple removed by Dr. Danella Sensing in March 2015: right neck, left neck, scalp    Bell's palsy 07/18/1979   Cervicalgia 01/31/2012   Closed fracture of lumbar vertebra without mention of spinal cord injury 07/17/2005   Conjunctiva disorder 12/26/2010   Coronary atherosclerosis of native coronary artery 07/17/1997   Cramp of limb 08/16/2009   Degeneration of lumbar or lumbosacral intervertebral disc 01/31/2012   Disturbance of skin sensation 08/2009   Dizziness and giddiness 11/08/2011   vertigo   External hemorrhoids  without mention of complication 02/12/8118   External hemorrhoids without mention of complication 14/78/2956   Ganglion of tendon sheath 08/16/2009   Leg cramp 10/27/2013   Most frequently the right leg.    Long term (current) use of anticoagulants 09/2010   Lumbago 07/2009   Meralgia paresthetica 07/2007   MI, old    Myalgia and myositis, unspecified 01/17/2012   Osteoporosis    Other abnormal  blood chemistry 04/08/2012   Other abnormal blood chemistry 2013   hyperglycemia   Other disorder of muscle, ligament, and fascia 04/08/2012   Other specified cardiac dysrhythmias(427.89) 06/27/2010   Pacemaker 05/06/2020   Pain in joint, ankle and foot 10/27/2013   Bilateral since 1998    Pain in joint, shoulder region 08/12/2012   Pain in joint, upper arm 12/26/2010   Pain in limb 01/11/2011   Pain in thoracic spine 01/31/2012   Pathologic fracture of vertebrae 01/22/2012   PVC's (premature ventricular contractions)    Rash and other nonspecific skin eruption 08/02/2011   Senile osteoporosis 07/17/1993   Stroke ([place])    Unspecified essential hypertension 07/17/1997   Varicose veins of lower extremities 08/16/2009   Varicose veins of lower extremities 08/16/2009   Past Surgical History:  Procedure Laterality Date   COLONOSCOPY WITH PROPOFOL N/A 10/19/2015   Procedure: COLONOSCOPY WITH PROPOFOL;  Surgeon: Gatha Mayer, MD;  [facility];  Service: Endoscopy;  Laterality: N/A;   CORONARY ARTERY BYPASS GRAFT  1998   x2; LIMA to LAD; SVG to diagonal off bypass   HEMORRHOID SURGERY  08/26/2012   Dr. Brantley Stage   LOOP RECORDER INSERTION N/A 05/13/2018   Procedure: LOOP RECORDER INSERTION;  Surgeon: [name], MD;  Location: [place] CV LAB;  Service: Cardiovascular;  Laterality: N/A;   LOOP RECORDER REMOVAL N/A 05/26/2019   Procedure: LOOP RECORDER REMOVAL;  Surgeon: [name], MD;  Location: [place] [facility];  Service: Cardiovascular;  Laterality: N/A;   MASS EXCISION Left 11/23/2015   Procedure: LEFT WRIST EXCISION CYST;  Surgeon: Leanora Cover, MD;  Location: [facility];  Service: Orthopedics;  Laterality: Left;   PACEMAKER IMPLANT N/A 05/26/2019   Procedure: PACEMAKER IMPLANT;  Surgeon: [name], MD;  Location: [place] [facility];  Service: Cardiovascular;  Laterality: N/A;   SKIN BIOPSY  01/29/14   (R) neck; (R) scalp, 2 (L) neck; shave biopsy  Superficial basal cell carcinoma Dr. Danella Sensing   TONSILLECTOMY  [phone]    Allergies  Allergen Reactions   Iodinated Contrast Media Nausea Only and Other (See Comments)    Severe nausea and also passed out   Iodine    Other     Seasonal Allergies.   Bactrim Rash   Relafen [Nabumetone] Rash   Sulfanilamide Rash    Allergies as of 04/17/2022       Reactions   Iodinated Contrast Media Nausea Only, Other (See Comments)   Severe nausea and also passed out   Iodine    Other    Seasonal Allergies.   Bactrim Rash   Relafen [nabumetone] Rash   Sulfanilamide Rash        Medication List        Accurate as of April 17, 2022  3:24 PM. If you have any questions, ask your nurse or doctor.          acetaminophen 325 MG tablet Commonly known as: TYLENOL Take 650 mg by mouth every 8 (eight) hours as needed.   amLODipine  5 MG tablet Commonly known as: NORVASC Take 5 mg by mouth daily.   aspirin EC 81 MG tablet Take 1 tablet (81 mg total) by mouth daily. Swallow whole.   atenolol 50 MG tablet Commonly known as: TENORMIN Take 50 mg by mouth 2 (two) times daily. HOLD FOR SPB <90 or HR< 60   calcium-vitamin D 500-200 MG-UNIT tablet Commonly known as: OSCAL WITH D Take 1 tablet by mouth daily.   diclofenac Sodium 1 % Gel Commonly known as: VOLTAREN Apply 4 g topically 4 (four) times daily as needed. Left knee, right knee, left ankle, back or side   digoxin 0.125 MG tablet Commonly known as: LANOXIN Take 0.125 mg by mouth every other day.   docusate sodium 100 MG capsule Commonly known as: Colace Take 1 capsule (100 mg total) by mouth daily.   hydrALAZINE 10 MG tablet Commonly known as: APRESOLINE Take 1 tablet (10 mg total) by mouth 2 (two) times daily as needed (for SBP> 170).   hydrochlorothiazide 50 MG tablet Commonly known as: HYDRODIURIL Take 25 mg by mouth daily.   levocetirizine 5 MG tablet Commonly known as: XYZAL Take 5 mg by mouth every evening.    lidocaine 5 % Commonly known as: LIDODERM Place 1 patch onto the skin daily. Remove & Discard patch within 12 hours or as directed by MD   LORazepam 1 MG tablet Commonly known as: ATIVAN Take 1 tablet (1 mg total) by mouth at bedtime.   methocarbamol 500 MG tablet Commonly known as: Robaxin Take 0.5 tablets (250 mg total) by mouth 2 (two) times daily as needed for muscle spasms. What changed: Another medication with the same name was removed. Continue taking this medication, and follow the directions you see here. Changed by: [name], MD   ondansetron 4 MG tablet Commonly known as: ZOFRAN Take 1 tablet (4 mg total) by mouth every 6 (six) hours as needed for nausea.   polyethylene glycol 17 g packet Commonly known as: MIRALAX / GLYCOLAX Take 17 g by mouth daily.   potassium chloride SA 20 MEQ tablet Commonly known as: KLOR-CON M Take 10 mEq by mouth daily.   traMADol 50 MG tablet Commonly known as: ULTRAM Take 1 tablet (50 mg total) by mouth every 6 (six) hours as needed for moderate pain.        Review of Systems  Constitutional:  Positive for activity change. Negative for appetite change.  HENT: Negative.    Respiratory:  Positive for cough. Negative for shortness of breath.   Cardiovascular:  Negative for leg swelling.  Gastrointestinal:  Negative for constipation.  Genitourinary: Negative.   Musculoskeletal:  Positive for back pain and gait problem. Negative for arthralgias and myalgias.  Skin: Negative.   Neurological:  Negative for dizziness and weakness.  Psychiatric/Behavioral:  Negative for confusion, dysphoric mood and sleep disturbance.     Immunization History  Administered Date(s) Administered   Influenza Whole 08/06/2012   Influenza, High Dose Seasonal PF 08/15/2019, 08/27/2020   Influenza,inj,Quad PF,6+ Mos 08/30/2018   Influenza-Unspecified 08/06/2013, 08/24/2014, 08/26/2015, 08/31/2016, 08/27/2017, 08/27/2020   Moderna Sars-Covid-2  Vaccination 11/16/2019, 12/16/2019, 09/16/2020   Pneumococcal Conjugate-13 09/22/2015   Pneumococcal Polysaccharide-23 08/29/2006   Td 10/07/2007   Zoster Recombinat (Shingrix) 02/12/2018, 05/13/2018   Zoster, Live 03/12/2008   Pertinent  Health Maintenance Due  Topic Date Due   INFLUENZA VACCINE  06/06/2022   DEXA SCAN  Completed      08/23/2020    9:56 AM 08/23/2020  10:33 PM 08/26/2020    6:15 AM 09/23/2020   11:32 AM 09/27/2021   10:28 AM  Fall Risk  Falls in the past year?   1 1 0  Was there an injury with Fall?   1 1 0  Was there an injury with Fall? - Comments    Patient fractured pelvis and had a stroke   Fall Risk Category Calculator   2 2 0  Fall Risk Category   Moderate Moderate Low  Patient Fall Risk Level High fall risk High fall risk  Moderate fall risk Low fall risk  Patient at Risk for Falls Due to   History of fall(s);Impaired balance/gait;Impaired mobility;Orthopedic patient  No Fall Risks  Fall risk Follow up   Falls evaluation completed;Education provided;Falls prevention discussed;Follow up appointment  Falls evaluation completed   Functional Status Survey:    Vitals:   04/17/22 1450  BP: (!) 167/75  Pulse: 86  Resp: 16  Temp: (!) 97 F (36.1 C)  SpO2: 98%  Weight: 130 lb (59 kg)  Height: '5\' 6"'$  (1.676 m)   Body mass index is 20.98 kg/m. Physical Exam Vitals reviewed.  Constitutional:      Appearance: Normal appearance.  HENT:     Head: Normocephalic.     Nose: Nose normal.     Mouth/Throat:     Mouth: Mucous membranes are moist.     Pharynx: Oropharynx is clear.  Eyes:     Pupils: Pupils are equal, round, and reactive to light.  Cardiovascular:     Rate and Rhythm: Normal rate and regular rhythm.     Pulses: Normal pulses.     Heart sounds: Normal heart sounds. No murmur heard. Pulmonary:     Effort: Pulmonary effort is normal.     Breath sounds: Normal breath sounds.  Abdominal:     General: Abdomen is flat. Bowel sounds are  normal.     Palpations: Abdomen is soft.  Musculoskeletal:        General: No swelling.     Cervical back: Neck supple.     Comments: Right hIp Movement was pain free C/o Tenderness in Right paraspinal area in Lower Thoracic region  Skin:    General: Skin is warm.  Neurological:     Mental Status: She is alert.     Comments: Left Hemiparesis  Psychiatric:        Mood and Affect: Mood normal.        Thought Content: Thought content normal.     Labs reviewed: Recent Labs    09/26/21 0000 01/13/22 0000 02/02/22 0000  NA 141 135* 135*  K 3.8 4.2 4.1  CL 100 93* 93*  CO2 29* 33* 23*  BUN 25* 22* 25*  CREATININE 1.0 1.1 1.1  CALCIUM 9.4 9.3 10.0   Recent Labs    08/16/21 0000 09/26/21 0000  AST 30 15  ALT 23 8  ALKPHOS 87 90  ALBUMIN 3.1* 3.6   Recent Labs    09/26/21 0000 01/13/22 0000 02/02/22 0000  WBC 6.7 6.2 7.4  HGB 11.5* 10.4* 10.8*  HCT 36 31* 32*  PLT 125* 143* 138*   Lab Results  Component Value Date   TSH 2.87 02/03/2022   Lab Results  Component Value Date   HGBA1C 5.7 (H) 08/19/2020   Lab Results  Component Value Date   CHOL 180 09/26/2021   HDL 44 09/26/2021   LDLCALC 122 09/26/2021   LDLDIRECT 81.3 08/19/2020   TRIG 70 09/26/2021  CHOLHDL 2 09/12/2010    Significant Diagnostic Results in last 30 days:  No results found.  Assessment/Plan 1. Chronic right-sided low back pain with right-sided sciatica Plan d/w daughter in detail Will Continue Pain control for now Pain seems Muscular Could be due to her Compression fracture and Therapy Robaxin and Tramadol Prn is helping will continue for now If pain gets worse Consider Ortho  2. H/O compression fracture of spine Tramadol and robaxin  3. 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 Essential hypertension BP high but has been in Pain  Will monitor for 1 week and reval  Permanent atrial fibrillation (HCC) Toprol and Digoxin  Dig level normal Not on Anticoagulation  Family/ staff Communication:   Labs/tests ordered:

## 2022-04-19 DIAGNOSIS — I10 Essential (primary) hypertension: Secondary | ICD-10-CM | POA: Diagnosis not present

## 2022-04-19 LAB — BASIC METABOLIC PANEL
BUN: 23 — AB (ref 4–21)
CO2: 29 — AB (ref 13–22)
Chloride: 95 — AB (ref 99–108)
Creatinine: 1.1 (ref 0.5–1.1)
Glucose: 94
Potassium: 4.3 mEq/L (ref 3.5–5.1)
Sodium: 136 — AB (ref 137–147)

## 2022-04-19 LAB — COMPREHENSIVE METABOLIC PANEL: Calcium: 9.3 (ref 8.7–10.7)

## 2022-04-24 ENCOUNTER — Non-Acute Institutional Stay (SKILLED_NURSING_FACILITY): Payer: Medicare Other | Admitting: Internal Medicine

## 2022-04-24 ENCOUNTER — Encounter: Payer: Self-pay | Admitting: Internal Medicine

## 2022-04-24 DIAGNOSIS — M5441 Lumbago with sciatica, right side: Secondary | ICD-10-CM

## 2022-04-24 DIAGNOSIS — G8929 Other chronic pain: Secondary | ICD-10-CM

## 2022-04-24 DIAGNOSIS — I10 Essential (primary) hypertension: Secondary | ICD-10-CM | POA: Diagnosis not present

## 2022-04-24 DIAGNOSIS — I69354 Hemiplegia and hemiparesis following cerebral infarction affecting left non-dominant side: Secondary | ICD-10-CM

## 2022-04-24 DIAGNOSIS — Z8781 Personal history of (healed) traumatic fracture: Secondary | ICD-10-CM | POA: Diagnosis not present

## 2022-04-24 DIAGNOSIS — Z961 Presence of intraocular lens: Secondary | ICD-10-CM | POA: Diagnosis not present

## 2022-04-24 NOTE — Progress Notes (Unsigned)
Location: [facility] of Service:  SNF (31)  Provider:   Code Status:  Goals of Care:     04/17/2022    3:24 PM  Advanced Directives  Does Patient Have a Medical Advance Directive? Yes  Type of Paramedic of [place];Living will;Out of facility DNR (pink MOST or yellow form)  Does patient want to make changes to medical advance directive? No - Patient declined  Copy of [place] in Chart? Yes - validated most recent copy scanned in chart (See row information)  Pre-existing out of facility DNR order (yellow form or pink MOST form) Pink MOST/Yellow Form most recent copy in chart - Physician notified to receive inpatient order     Chief Complaint  Patient presents with   Acute Visit    HPI: Patient is a 88 y.o. 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  With extraperitoneal hemorrhage in 10/21 Patient was  off Eliquis before her Embolic stroke due to ruptured Baker's cyst  Recent Delirium  Seen for back pain last week . Pain better on Tramadol and Tylenol and Voltaren gel Has not used Robaxin  But today when she was working with therapy pain came back again And she could not stand and walk with him and it is not weakness but Pain in her Lower back not Hip Pain is bearable when she is sitting and Sleeping Has used Tramadol PRN just onceEveryday  Past Medical History:  Diagnosis Date   Actinic keratosis 05/25/2014   Anxiety    Atrial fibrillation ([place]) 09/21/2010   Basal cell carcinoma 05/25/2014    Multiple removed by Dr. Danella Sensing in March 2015: right neck, left neck, scalp    Bell's palsy 07/18/1979   Cervicalgia 01/31/2012   Closed fracture of lumbar vertebra without mention of spinal cord injury 07/17/2005   Conjunctiva disorder 12/26/2010   Coronary atherosclerosis of native coronary artery 07/17/1997   Cramp of limb 08/16/2009   Degeneration of lumbar or lumbosacral intervertebral disc 01/31/2012   Disturbance of skin sensation 08/2009   Dizziness and giddiness 11/08/2011   vertigo   External hemorrhoids without mention of complication 4/96/7591   External hemorrhoids without mention of complication 63/84/6659   Ganglion of tendon sheath 08/16/2009   Leg cramp 10/27/2013   Most frequently the right leg.    Long term (current) use of anticoagulants 09/2010   Lumbago 07/2009   Meralgia paresthetica 07/2007   MI, old    Myalgia and myositis, unspecified 01/17/2012   Osteoporosis    Other abnormal blood chemistry 04/08/2012   Other abnormal blood chemistry 2013   hyperglycemia   Other disorder of muscle, ligament, and fascia 04/08/2012   Other specified cardiac dysrhythmias(427.89) 06/27/2010   Pacemaker 05/06/2020   Pain in joint, ankle and foot 10/27/2013   Bilateral since 1998    Pain in joint, shoulder region 08/12/2012   Pain in joint, upper arm 12/26/2010   Pain in limb 01/11/2011   Pain in thoracic spine 01/31/2012   Pathologic fracture of vertebrae 01/22/2012  PVC's (premature ventricular contractions)    Rash and other nonspecific skin eruption 08/02/2011   Senile osteoporosis 07/17/1993   Stroke ([place])    Unspecified essential hypertension 07/17/1997   Varicose veins of lower extremities 08/16/2009   Varicose veins of lower extremities 08/16/2009    Past Surgical History:  Procedure Laterality Date   COLONOSCOPY WITH PROPOFOL N/A 10/19/2015   Procedure: COLONOSCOPY WITH PROPOFOL;  Surgeon: Gatha Mayer, MD;  [facility];  Service: Endoscopy;  Laterality: N/A;    CORONARY ARTERY BYPASS GRAFT  1998   x2; LIMA to LAD; SVG to diagonal off bypass   HEMORRHOID SURGERY  08/26/2012   Dr. Brantley Stage   LOOP RECORDER INSERTION N/A 05/13/2018   Procedure: LOOP RECORDER INSERTION;  Surgeon: [name], MD;  Location: [place] [facility];  Service: Cardiovascular;  Laterality: N/A;   LOOP RECORDER REMOVAL N/A 05/26/2019   Procedure: LOOP RECORDER REMOVAL;  Surgeon: [name], MD;  Location: [place] [facility];  Service: Cardiovascular;  Laterality: N/A;   MASS EXCISION Left 11/23/2015   Procedure: LEFT WRIST EXCISION CYST;  Surgeon: Leanora Cover, MD;  Location: [place];  Service: Orthopedics;  Laterality: Left;   PACEMAKER IMPLANT N/A 05/26/2019   Procedure: PACEMAKER IMPLANT;  Surgeon: [name], MD;  Location: [place] [facility];  Service: Cardiovascular;  Laterality: N/A;   SKIN BIOPSY  01/29/14   (R) neck; (R) scalp, 2 (L) neck; shave biopsy Superficial basal cell carcinoma Dr. Danella Sensing   TONSILLECTOMY  [phone]    Allergies  Allergen Reactions   Iodinated Contrast Media Nausea Only and Other (See Comments)    Severe nausea and also passed out   Iodine    Other     Seasonal Allergies.   Bactrim Rash   Relafen [Nabumetone] Rash   Sulfanilamide Rash    Outpatient Encounter Medications as of 04/24/2022  Medication Sig   acetaminophen (TYLENOL) 325 MG tablet Take 650 mg by mouth every 8 (eight) hours as needed.   amLODipine (NORVASC) 5 MG tablet Take 5 mg by mouth daily.   aspirin EC 81 MG EC tablet Take 1 tablet (81 mg total) by mouth daily. Swallow whole.   atenolol (TENORMIN) 50 MG tablet Take 50 mg by mouth 2 (two) times daily. HOLD FOR SPB <90 or HR< 60   calcium-vitamin D (OSCAL WITH D) 500-200 MG-UNIT tablet Take 1 tablet by mouth daily.   diclofenac Sodium (VOLTAREN) 1 % GEL Apply 4 g topically 4 (four) times daily as needed. Left knee, right knee, left ankle, back or side   digoxin (LANOXIN) 0.125 MG tablet Take  0.125 mg by mouth every other day.   docusate sodium (COLACE) 100 MG capsule Take 1 capsule (100 mg total) by mouth daily.   hydrALAZINE (APRESOLINE) 10 MG tablet Take 1 tablet (10 mg total) by mouth 2 (two) times daily as needed (for SBP> 170).   hydrochlorothiazide (HYDRODIURIL) 50 MG tablet Take 25 mg by mouth daily.   levocetirizine (XYZAL) 5 MG tablet Take 5 mg by mouth every evening.   lidocaine (LIDODERM) 5 % Place 1 patch onto the skin daily. Remove & Discard patch within 12 hours or as directed by MD   LORazepam (ATIVAN) 1 MG tablet Take 1 tablet (1 mg total) by mouth at bedtime.   methocarbamol (ROBAXIN) 500 MG tablet Take 0.5 tablets (250 mg total) by mouth 2 (two) times daily as needed for muscle spasms.   ondansetron (ZOFRAN)  4 MG tablet Take 1 tablet (4 mg total) by mouth every 6 (six) hours as needed for nausea.   polyethylene glycol (MIRALAX / GLYCOLAX) 17 g packet Take 17 g by mouth daily.   potassium chloride SA (KLOR-CON) 20 MEQ tablet Take 10 mEq by mouth daily.   traMADol (ULTRAM) 50 MG tablet Take 1 tablet (50 mg total) by mouth every 6 (six) hours as needed for moderate pain.   No facility-administered encounter medications on file as of 04/24/2022.    Review of Systems:  Review of Systems  Health Maintenance  Topic Date Due   TETANUS/TDAP  10/06/2017   COVID-19 Vaccine (4 - Booster for Moderna series) 11/06/2028 (Originally 11/11/2020)   INFLUENZA VACCINE  06/06/2022   Pneumonia Vaccine 26+ Years old  Completed   DEXA SCAN  Completed   Zoster Vaccines- Shingrix  Completed   HPV VACCINES  Aged Out    Physical Exam: Vitals:   04/24/22 1541  BP: (!) 162/82  Pulse: 76  Resp: 18  Temp: 97.6 F (36.4 C)  SpO2: 98%  Weight: 130 lb (59 kg)   Body mass index is 20.98 kg/m. Physical Exam  Labs reviewed: Basic Metabolic Panel: Recent Labs    01/13/22 0000 02/02/22 0000 02/03/22 0000 04/19/22 0000  NA 135* 135*  --  136*  K 4.2 4.1  --  4.3  CL 93* 93*   --  95*  CO2 33* 23*  --  29*  BUN 22* 25*  --  23*  CREATININE 1.1 1.1  --  1.1  CALCIUM 9.3 10.0  --  9.3  TSH  --   --  2.87  --    Liver Function Tests: Recent Labs    08/16/21 0000 09/26/21 0000  AST 30 15  ALT 23 8  ALKPHOS 87 90  ALBUMIN 3.1* 3.6   No results for input(s): "LIPASE", "AMYLASE" in the last 8760 hours. No results for input(s): "AMMONIA" in the last 8760 hours. CBC: Recent Labs    09/26/21 0000 01/13/22 0000 02/02/22 0000  WBC 6.7 6.2 7.4  HGB 11.5* 10.4* 10.8*  HCT 36 31* 32*  PLT 125* 143* 138*   Lipid Panel: Recent Labs    08/16/21 0000 09/26/21 0000  CHOL 133 180  HDL 29* 44  LDLCALC 94 122  TRIG 52 70   Lab Results  Component Value Date   HGBA1C 5.7 (H) 08/19/2020    Procedures since last visit: No results found.  Assessment/Plan 1. Chronic right-sided low back pain with right-sided sciatica Will continue Tramdol Robaxin and voltaren for now Hold therapy rest of the week  Reval next week Discussed with daughter  2. H/O compression fracture of spine ***  3. 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 Essential hypertension BP still high  Will start her in Losartan 25 mg QD   Labs/tests ordered:  * No order type specified * Next appt:  Visit date not found

## 2022-04-25 ENCOUNTER — Encounter: Payer: Self-pay | Admitting: Orthopedic Surgery

## 2022-04-25 ENCOUNTER — Non-Acute Institutional Stay (SKILLED_NURSING_FACILITY): Payer: Medicare Other | Admitting: Orthopedic Surgery

## 2022-04-25 DIAGNOSIS — I4430 Unspecified atrioventricular block: Secondary | ICD-10-CM

## 2022-04-25 DIAGNOSIS — K5903 Drug induced constipation: Secondary | ICD-10-CM

## 2022-04-25 DIAGNOSIS — G8929 Other chronic pain: Secondary | ICD-10-CM | POA: Diagnosis not present

## 2022-04-25 DIAGNOSIS — D696 Thrombocytopenia, unspecified: Secondary | ICD-10-CM | POA: Diagnosis not present

## 2022-04-25 DIAGNOSIS — I10 Essential (primary) hypertension: Secondary | ICD-10-CM | POA: Diagnosis not present

## 2022-04-25 DIAGNOSIS — R001 Bradycardia, unspecified: Secondary | ICD-10-CM | POA: Diagnosis not present

## 2022-04-25 DIAGNOSIS — I4821 Permanent atrial fibrillation: Secondary | ICD-10-CM | POA: Diagnosis not present

## 2022-04-25 DIAGNOSIS — M5441 Lumbago with sciatica, right side: Secondary | ICD-10-CM

## 2022-04-25 DIAGNOSIS — I69354 Hemiplegia and hemiparesis following cerebral infarction affecting left non-dominant side: Secondary | ICD-10-CM | POA: Diagnosis not present

## 2022-04-25 NOTE — Progress Notes (Signed)
Location:  [facility] Room Number: 118/A Place of Service:  SNF [phone]) Provider: Yvonna Alanis, NP   Patient Care Team: [name], MD as PCP - General (Internal Medicine) [name], MD as PCP - Electrophysiology (Cardiology) [name], MD as Consulting Physician (Dermatology) [name], MD as Consulting Physician (Cardiology) [name], MD as Consulting Physician (Dermatology)  Extended Emergency Contact Information Primary Emergency Contact: [name] of [place] Phone: [phone] Mobile Phone: [phone] Relation: Daughter Secondary Emergency Contact: [name],[name]  [name] of [place] Phone: [phone] Relation: Son  Code Status:  DNR Goals of care: Advanced Directive information    04/25/2022   11:58 AM  Advanced Directives  Does Patient Have a Medical Advance Directive? Yes  Type of Paramedic of [place];Living will;Out of facility DNR (pink MOST or yellow form)  Does patient want to make changes to medical advance directive? No - Patient declined  Copy of [place] in Chart? Yes - validated most recent copy scanned in chart (See row information)  Pre-existing out of facility DNR order (yellow form or pink MOST form) Pink MOST/Yellow Form most recent copy in chart - Physician notified to receive inpatient order     Chief Complaint  Patient presents with   Medical Management of Chronic Issues    Routine visit.   Quality Metric Gaps    Discuss the need for TDAP, or post pone if patient refuses.     HPI:  Pt is a 88 y.o. female seen today for medical management of chronic diseases.    She currently resides on the skilled nursing unit at [facility]. Past medical history includes: atrial fibrillation, AV block, CAD, hypertension, GERD, stroke, left sided paralysis, constipation, and dysphagia.     Left sided weakness- CVA (right basal ganglia embolic stroke) 50/5397, lives in skilled nursing, ambulates with PWC, remains on asa (h/o GI bleed on coumadin and ruptured Bakers cyst on Eliquis), not on statin Chronic back pain- ongoing, pain from neck to right lower back, no pain this morning, reports improved pain with tramadol and robaxin, PT held this week Bradycardia- HR 56 this morning, was 61 (06/19), 76 (06/18), 54 (06/17), remains on atenolol and Digoxin for HTN/afib- see below HTN- BUN/creat 23/1.1 04/19/2022, remains on metoprolol and HCTZ, Hydralazine prn for SBP > 170 Atrial fibrillation- HR controlled with Digoxin- (dig level 0.56 06/2021), remains on asa for clot prevention AV block- s/p pacemaker, last check 05/01 Thrombocytopenia- platelets 138 02/02/2022 Constipation- LBM 06/19, remains on colace and miralax  No recent falls or injuries. Ambulates with PWC.   Recent blood pressures:  06/20- 125/75 06/19- 166/79 06/18- 162/80  Recent weights:  06/01- 130 lbs  05/01- 129.8 lbs  04/05- 128.5 lbs  Past Medical History:  Diagnosis Date   Actinic keratosis 05/25/2014   Anxiety    Atrial fibrillation ([place]) 09/21/2010   Basal cell carcinoma 05/25/2014   Multiple removed by Dr. Danella Sensing in March 2015: right neck, left neck, scalp    Bell's palsy 07/18/1979   Cervicalgia 01/31/2012   Closed fracture of lumbar vertebra without mention of spinal cord injury 07/17/2005   Conjunctiva disorder 12/26/2010   Coronary atherosclerosis of native coronary artery 07/17/1997   Cramp of limb 08/16/2009   Degeneration of lumbar or lumbosacral intervertebral disc 01/31/2012   Disturbance of skin sensation 08/2009   Dizziness and giddiness 11/08/2011   vertigo  External hemorrhoids without mention of complication 3/66/4403   External hemorrhoids without mention of complication 47/42/5956   Ganglion of tendon sheath 08/16/2009   Leg cramp 10/27/2013   Most frequently the right leg.     Long term (current) use of anticoagulants 09/2010   Lumbago 07/2009   Meralgia paresthetica 07/2007   MI, old    Myalgia and myositis, unspecified 01/17/2012   Osteoporosis    Other abnormal blood chemistry 04/08/2012   Other abnormal blood chemistry 2013   hyperglycemia   Other disorder of muscle, ligament, and fascia 04/08/2012   Other specified cardiac dysrhythmias(427.89) 06/27/2010   Pacemaker 05/06/2020   Pain in joint, ankle and foot 10/27/2013   Bilateral since 1998    Pain in joint, shoulder region 08/12/2012   Pain in joint, upper arm 12/26/2010   Pain in limb 01/11/2011   Pain in thoracic spine 01/31/2012   Pathologic fracture of vertebrae 01/22/2012   PVC's (premature ventricular contractions)    Rash and other nonspecific skin eruption 08/02/2011   Senile osteoporosis 07/17/1993   Stroke ([place])    Unspecified essential hypertension 07/17/1997   Varicose veins of lower extremities 08/16/2009   Varicose veins of lower extremities 08/16/2009   Past Surgical History:  Procedure Laterality Date   COLONOSCOPY WITH PROPOFOL N/A 10/19/2015   Procedure: COLONOSCOPY WITH PROPOFOL;  Surgeon: Gatha Mayer, MD;  Location: [facility];  Service: Endoscopy;  Laterality: N/A;   CORONARY ARTERY BYPASS GRAFT  1998   x2; LIMA to LAD; SVG to diagonal off bypass   HEMORRHOID SURGERY  08/26/2012   Dr. Brantley Stage   LOOP RECORDER INSERTION N/A 05/13/2018   Procedure: LOOP RECORDER INSERTION;  Surgeon: [name], MD;  Location: [facility];  Service: Cardiovascular;  Laterality: N/A;   LOOP RECORDER REMOVAL N/A 05/26/2019   Procedure: LOOP RECORDER REMOVAL;  Surgeon: [name], MD;  Location: [facility];  Service: Cardiovascular;  Laterality: N/A;   MASS EXCISION Left 11/23/2015   Procedure: LEFT WRIST EXCISION CYST;  Surgeon: Leanora Cover, MD;  Location: [facility];  Service: Orthopedics;  Laterality: Left;   PACEMAKER IMPLANT N/A 05/26/2019   Procedure: PACEMAKER  IMPLANT;  Surgeon: [name], MD;  Location: [facility];  Service: Cardiovascular;  Laterality: N/A;   SKIN BIOPSY  01/29/14   (R) neck; (R) scalp, 2 (L) neck; shave biopsy Superficial basal cell carcinoma Dr. Danella Sensing   TONSILLECTOMY  [phone]    Allergies  Allergen Reactions   Iodinated Contrast Media Nausea Only and Other (See Comments)    Severe nausea and also passed out   Iodine    Other     Seasonal Allergies.   Bactrim Rash   Relafen [Nabumetone] Rash   Sulfanilamide Rash    Outpatient Encounter Medications as of 04/25/2022  Medication Sig   acetaminophen (TYLENOL) 325 MG tablet Take 650 mg by mouth 3 (three) times daily as needed.   amLODipine (NORVASC) 5 MG tablet Take 5 mg by mouth daily.   aspirin EC 81 MG EC tablet Take 1 tablet (81 mg total) by mouth daily. Swallow whole.   atenolol (TENORMIN) 50 MG tablet Take 50 mg by mouth 2 (two) times daily. HOLD FOR SPB <90 or HR< 60   calcium-vitamin D (OSCAL WITH D) 500-200 MG-UNIT tablet Take 1 tablet by mouth daily.   diclofenac Sodium (VOLTAREN) 1 % GEL Apply 4 g topically 4 (four) times daily as needed. Left knee,  right knee, left ankle, back or side   digoxin (LANOXIN) 0.125 MG tablet Take 0.125 mg by mouth every other day.   docusate sodium (COLACE) 100 MG capsule Take 1 capsule (100 mg total) by mouth daily.   hydrALAZINE (APRESOLINE) 10 MG tablet Take 1 tablet (10 mg total) by mouth 2 (two) times daily as needed (for SBP> 170).   hydrochlorothiazide (HYDRODIURIL) 50 MG tablet Take 25 mg by mouth daily.   levocetirizine (XYZAL) 5 MG tablet Take 5 mg by mouth every evening.   lidocaine (LIDODERM) 5 % Place 1 patch onto the skin daily. Remove & Discard patch within 12 hours or as directed by MD   LORazepam (ATIVAN) 1 MG tablet Take 1 tablet (1 mg total) by mouth at bedtime.   losartan (COZAAR) 25 MG tablet Take 25 mg by mouth daily.   ondansetron (ZOFRAN) 4 MG tablet Take 1 tablet (4 mg total) by mouth every  6 (six) hours as needed for nausea.   polyethylene glycol (MIRALAX / GLYCOLAX) 17 g packet Take 17 g by mouth daily.   potassium chloride SA (KLOR-CON) 20 MEQ tablet Take 10 mEq by mouth daily.   [DISCONTINUED] methocarbamol (ROBAXIN) 500 MG tablet Take 0.5 tablets (250 mg total) by mouth 2 (two) times daily as needed for muscle spasms.   [DISCONTINUED] traMADol (ULTRAM) 50 MG tablet Take 1 tablet (50 mg total) by mouth every 6 (six) hours as needed for moderate pain.   No facility-administered encounter medications on file as of 04/25/2022.    Review of Systems  Constitutional:  Negative for activity change, appetite change, chills, fatigue and fever.  HENT:  Negative for congestion and trouble swallowing.   Eyes:  Negative for visual disturbance.  Respiratory:  Negative for cough, shortness of breath and wheezing.   Cardiovascular:  Negative for chest pain and leg swelling.  Gastrointestinal:  Positive for constipation. Negative for abdominal distention, abdominal pain, diarrhea, nausea and vomiting.  Genitourinary:  Negative for dysuria and frequency.  Musculoskeletal:  Positive for arthralgias, back pain and gait problem.  Skin:  Negative for wound.  Neurological:  Positive for weakness. Negative for dizziness and headaches.  Psychiatric/Behavioral:  Negative for confusion, dysphoric mood and sleep disturbance. The patient is not nervous/anxious.     Immunization History  Administered Date(s) Administered   Influenza Whole 08/06/2012   Influenza, High Dose Seasonal PF 08/15/2019, 08/27/2020   Influenza,inj,Quad PF,6+ Mos 08/30/2018   Influenza-Unspecified 08/06/2013, 08/24/2014, 08/26/2015, 08/31/2016, 08/27/2017, 08/27/2020   Moderna Sars-Covid-2 Vaccination 11/16/2019, 12/16/2019, 09/16/2020   Pneumococcal Conjugate-13 09/22/2015   Pneumococcal Polysaccharide-23 08/29/2006   Td 10/07/2007   Zoster Recombinat (Shingrix) 02/12/2018, 05/13/2018   Zoster, Live 03/12/2008    Pertinent  Health Maintenance Due  Topic Date Due   INFLUENZA VACCINE  06/06/2022   DEXA SCAN  Completed      08/23/2020    9:56 AM 08/23/2020   10:33 PM 08/26/2020    6:15 AM 09/23/2020   11:32 AM 09/27/2021   10:28 AM  Fall Risk  Falls in the past year?   1 1 0  Was there an injury with Fall?   1 1 0  Was there an injury with Fall? - Comments    Patient fractured pelvis and had a stroke   Fall Risk Category Calculator   2 2 0  Fall Risk Category   Moderate Moderate Low  Patient Fall Risk Level High fall risk High fall risk  Moderate fall risk Low fall risk  Patient at Risk for Falls Due to   History of fall(s);Impaired balance/gait;Impaired mobility;Orthopedic patient  No Fall Risks  Fall risk Follow up   Falls evaluation completed;Education provided;Falls prevention discussed;Follow up appointment  Falls evaluation completed   Functional Status Survey:    Vitals:   04/25/22 1145  BP: 125/75  Pulse: 61  Resp: 20  Temp: (!) 96.8 F (36 C)  SpO2: 98%  Weight: 130 lb (59 kg)  Height: '5\' 6"'$  (1.676 m)   Body mass index is 20.98 kg/m. Physical Exam Vitals reviewed.  Constitutional:      General: She is not in acute distress. HENT:     Head: Normocephalic.     Right Ear: There is no impacted cerumen.     Left Ear: There is no impacted cerumen.     Nose: Nose normal.     Mouth/Throat:     Mouth: Mucous membranes are moist.  Eyes:     General:        Right eye: No discharge.        Left eye: No discharge.  Cardiovascular:     Rate and Rhythm: Normal rate and regular rhythm.     Pulses: Normal pulses.     Heart sounds: Normal heart sounds.  Pulmonary:     Effort: Pulmonary effort is normal. No respiratory distress.     Breath sounds: Normal breath sounds. No wheezing.  Abdominal:     General: Bowel sounds are normal. There is no distension.     Palpations: Abdomen is soft.     Tenderness: There is no abdominal tenderness.  Musculoskeletal:     Cervical  back: Neck supple. No swelling, deformity, erythema, signs of trauma, tenderness or crepitus. Normal range of motion.     Thoracic back: No swelling, deformity or tenderness. Normal range of motion.     Lumbar back: No swelling, deformity or tenderness. No scoliosis.     Right lower leg: No edema.     Left lower leg: No edema.     Comments: Mild forward neck protrusion  Feet:     Right foot:     Toenail Condition: Fungal disease present.    Left foot:     Toenail Condition: Fungal disease present. Skin:    General: Skin is warm and dry.     Capillary Refill: Capillary refill takes less than 2 seconds.  Neurological:     General: No focal deficit present.     Mental Status: She is alert and oriented to person, place, and time.     Motor: Weakness present.     Gait: Gait abnormal.     Comments: Left sided weakness, PWC  Psychiatric:        Mood and Affect: Mood normal.        Behavior: Behavior normal.     Labs reviewed: Recent Labs    01/13/22 0000 02/02/22 0000 04/19/22 0000  NA 135* 135* 136*  K 4.2 4.1 4.3  CL 93* 93* 95*  CO2 33* 23* 29*  BUN 22* 25* 23*  CREATININE 1.1 1.1 1.1  CALCIUM 9.3 10.0 9.3   Recent Labs    08/16/21 0000 09/26/21 0000  AST 30 15  ALT 23 8  ALKPHOS 87 90  ALBUMIN 3.1* 3.6   Recent Labs    09/26/21 0000 01/13/22 0000 02/02/22 0000  WBC 6.7 6.2 7.4  HGB 11.5* 10.4* 10.8*  HCT 36 31* 32*  PLT 125* 143* 138*   Lab Results  Component Value Date   TSH 2.87 02/03/2022   Lab Results  Component Value Date   HGBA1C 5.7 (H) 08/19/2020   Lab Results  Component Value Date   CHOL 180 09/26/2021   HDL 44 09/26/2021   LDLCALC 122 09/26/2021   LDLDIRECT 81.3 08/19/2020   TRIG 70 09/26/2021   CHOLHDL 2 09/12/2010    Significant Diagnostic Results in last 30 days:  No results found.  Assessment/Plan 1. 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 Chronic right-sided low back pain with right-sided sciatica - pain improved with Tramadol and Robaxin - PT cancelled this week  3. Bradycardia - HR 56 today, 61 (06/19), 76 (06/18), 54 (06/17) - asymptomatic - she is on atenolol for HTN/afib - digoxin level  4. Essential hypertension - cont atenolol and HCTZ - cont hydralazine for SBP> 170  5. Permanent atrial fibrillation (HCC) - HR controlled with Digoxin and atenolol - cont asa for clot prevention  6. AV block - s/p pacemaker  7. Thrombocytopenia ([facility]) - platelets stable  8. Drug-induced constipation - LBM 06/19, abdomen soft - cont colace and miralax    Family/ staff Communication: plan discussed with patient and nurse  Labs/tests ordered:  Digoxin level

## 2022-04-27 DIAGNOSIS — Z79899 Other long term (current) drug therapy: Secondary | ICD-10-CM | POA: Diagnosis not present

## 2022-05-02 ENCOUNTER — Ambulatory Visit: Payer: Medicare Other | Admitting: Podiatry

## 2022-05-08 DIAGNOSIS — I10 Essential (primary) hypertension: Secondary | ICD-10-CM | POA: Diagnosis not present

## 2022-05-08 LAB — BASIC METABOLIC PANEL
BUN: 24 — AB (ref 4–21)
CO2: 29 — AB (ref 13–22)
Chloride: 102 (ref 99–108)
Creatinine: 1.2 — AB (ref 0.5–1.1)
Glucose: 91
Potassium: 4.4 mEq/L (ref 3.5–5.1)
Sodium: 142 (ref 137–147)

## 2022-05-08 LAB — COMPREHENSIVE METABOLIC PANEL: Calcium: 9.2 (ref 8.7–10.7)

## 2022-05-23 ENCOUNTER — Ambulatory Visit: Payer: Medicare Other | Admitting: Podiatry

## 2022-05-25 ENCOUNTER — Ambulatory Visit: Payer: Medicare Other | Admitting: Podiatry

## 2022-05-25 ENCOUNTER — Non-Acute Institutional Stay (SKILLED_NURSING_FACILITY): Payer: Medicare Other | Admitting: Adult Health

## 2022-05-25 ENCOUNTER — Encounter: Payer: Self-pay | Admitting: Adult Health

## 2022-05-25 DIAGNOSIS — E782 Mixed hyperlipidemia: Secondary | ICD-10-CM | POA: Diagnosis not present

## 2022-05-25 DIAGNOSIS — D696 Thrombocytopenia, unspecified: Secondary | ICD-10-CM | POA: Diagnosis not present

## 2022-05-25 DIAGNOSIS — M5441 Lumbago with sciatica, right side: Secondary | ICD-10-CM | POA: Diagnosis not present

## 2022-05-25 DIAGNOSIS — I10 Essential (primary) hypertension: Secondary | ICD-10-CM | POA: Diagnosis not present

## 2022-05-25 DIAGNOSIS — K5901 Slow transit constipation: Secondary | ICD-10-CM

## 2022-05-25 DIAGNOSIS — G8929 Other chronic pain: Secondary | ICD-10-CM

## 2022-05-25 DIAGNOSIS — I4821 Permanent atrial fibrillation: Secondary | ICD-10-CM

## 2022-05-25 NOTE — Progress Notes (Signed)
Location:  [facility] Room Number: 118-A Place of Service:  SNF (31) Provider:  Cindi Carbon, [place] [phone]   [name], MD  Patient Care Team: [name], MD as PCP - General (Internal Medicine) [name], MD as PCP - Electrophysiology (Cardiology) [name], MD as Consulting Physician (Dermatology) [name], MD as Consulting Physician (Cardiology) [facility] [name], MD as Consulting Physician (Dermatology)  Extended Emergency Contact Information Primary Emergency Contact: [name] of [place] Phone: [phone] Mobile Phone: [phone] Relation: Daughter Secondary Emergency Contact: [name],[name]  [name] of [place] Phone: [phone] Relation: Son  Code Status:  DNR Goals of care: Advanced Directive information    05/25/2022   11:59 AM  Advanced Directives  Does Patient Have a Medical Advance Directive? Yes  Type of Advance Directive Living will;Out of facility DNR (pink MOST or yellow form)  Does patient want to make changes to medical advance directive? No - Patient declined  Pre-existing out of facility DNR order (yellow form or pink MOST form) Pink MOST form placed in chart (order not valid for inpatient use)     Chief Complaint  Patient presents with   Routine    HPI:  Pt is a 88 y.o. female seen today for medical management of chronic diseases.    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   Chronic back pain A lumbar/sacral xray was ordered on 2/2 which showed T 12 compression fracture age indeterminate and L1 burst fracture age indeterminate.  Currently using tylenol and voltaren gel prn for pain. Offered ortho  eval in the past but she declined. She states now her back is better still has periods of pain. She reports she got a new bed and feels more comfortable.   BPs reviewed   Blood Pressure: 117 / 67 mmHg  Blood Pressure: 118 / 72 mmHg  Blood Pressure: 133 / 75 mmHg  Blood Pressure: 127 / 72 mmHg  Blood Pressure: 124 / 68 mmHg  Blood Pressure: 128 / 81 mmHg  Weight range 128-130 over the past 3 months.   HR 63-70, no palpitations. 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 Now only on baby asa. Digoxin level 0.82 04/27/22  Past Surgical History:  Procedure Laterality Date   COLONOSCOPY WITH PROPOFOL N/A 10/19/2015   Procedure: COLONOSCOPY WITH PROPOFOL;  Surgeon: Gatha Mayer, MD;  Location: [facility];  Service: Endoscopy;  Laterality: N/A;   CORONARY ARTERY BYPASS GRAFT  1998   x2; LIMA to LAD; SVG to diagonal off bypass   HEMORRHOID SURGERY  08/26/2012   Dr. Brantley Stage   LOOP RECORDER INSERTION N/A 05/13/2018   Procedure: LOOP RECORDER INSERTION;  Surgeon: [name], MD;  Location: [facility];  Service: Cardiovascular;  Laterality: N/A;   LOOP RECORDER REMOVAL N/A 05/26/2019   Procedure: LOOP RECORDER REMOVAL;  Surgeon: [name], MD;  Location: [facility];  Service: Cardiovascular;  Laterality: N/A;   MASS EXCISION Left 11/23/2015   Procedure: LEFT WRIST EXCISION CYST;  Surgeon: Leanora Cover, MD;  Location: [facility];  Service: Orthopedics;  Laterality: Left;   PACEMAKER IMPLANT N/A 05/26/2019   Procedure: PACEMAKER IMPLANT;  Surgeon: [name], MD;  Location: [facility];  Service: Cardiovascular;  Laterality: N/A;   SKIN BIOPSY  01/29/14   (R) neck; (R) scalp, 2 (L) neck; shave biopsy Superficial basal cell carcinoma Dr. Danella Sensing   TONSILLECTOMY  [phone]    Allergies  Allergen Reactions   Iodinated Contrast Media Nausea Only and Other (See Comments)    Severe nausea and also passed out    Iodine    Other     Seasonal Allergies.   Bactrim Rash   Relafen [Nabumetone] Rash   Sulfanilamide Rash    Outpatient Encounter Medications as of 05/25/2022  Medication Sig   acetaminophen (TYLENOL) 325 MG tablet Take 650 mg by mouth 3 (three) times daily as needed.   amLODipine (NORVASC) 5 MG tablet Take 5 mg by mouth daily.   aspirin EC 81 MG EC tablet Take 1 tablet (81 mg total) by mouth daily. Swallow whole.   atenolol (TENORMIN) 50 MG tablet Take 50 mg by mouth 2 (two) times daily. HOLD FOR SPB <90 or HR< 60   calcium-vitamin D (OSCAL WITH D) 500-200 MG-UNIT tablet Take 1 tablet by mouth daily.   diclofenac Sodium (VOLTAREN) 1 % GEL Apply 4 g topically 4 (four) times daily as needed. Left knee, right knee, left ankle, back or side   digoxin (LANOXIN) 0.125 MG tablet Take 0.125 mg by mouth every other day.   docusate sodium (COLACE) 100 MG capsule Take 1 capsule (100 mg total) by mouth daily.   hydrALAZINE (APRESOLINE) 10 MG tablet Take 1 tablet (10 mg total) by mouth 2 (two) times daily as needed (for SBP> 170).   hydrochlorothiazide (HYDRODIURIL) 50 MG tablet Take 25 mg by mouth daily.   levocetirizine (XYZAL) 5 MG tablet Take 5 mg by mouth every evening.   lidocaine (LIDODERM) 5 % Place 1 patch onto the skin daily. Remove & Discard patch within 12 hours or as directed by MD   LORazepam (ATIVAN) 1 MG tablet Take 1 tablet (1 mg total) by mouth at bedtime.   losartan (COZAAR) 25 MG tablet Take 25 mg by mouth daily.   ondansetron (ZOFRAN) 4 MG tablet Take 1 tablet (4 mg total) by mouth every 6 (six) hours as needed for nausea.   polyethylene glycol (MIRALAX / GLYCOLAX) 17 g packet Take 17 g by mouth daily.   potassium chloride SA (KLOR-CON) 20 MEQ tablet Take 10 mEq by mouth daily.   No facility-administered encounter medications on file as of 05/25/2022.    Review of Systems  Constitutional:  Negative for activity change, appetite change, chills, diaphoresis, fatigue, fever and  unexpected weight change.  HENT:  Negative for congestion.   Respiratory:  Negative for cough, shortness of breath and wheezing.   Cardiovascular:  Negative for chest pain, palpitations and leg swelling.  Gastrointestinal:  Negative for abdominal distention, abdominal pain, constipation and diarrhea.  Genitourinary:  Negative for difficulty urinating and dysuria.  Musculoskeletal:  Positive for back pain (improved) and gait problem. Negative for arthralgias, joint swelling and myalgias.  Neurological:  Positive for speech difficulty (slight dysarthria) and weakness. Negative for dizziness, tremors, seizures, syncope, facial asymmetry, light-headedness, numbness and headaches.  Psychiatric/Behavioral:  Negative for agitation, behavioral problems and confusion.     Immunization History  Administered Date(s) Administered   Influenza Whole 08/06/2012   Influenza, High Dose Seasonal PF 08/15/2019, 08/27/2020   Influenza,inj,Quad PF,6+ Mos 08/30/2018   Influenza-Unspecified 08/06/2013, 08/24/2014, 08/26/2015, 08/31/2016, 08/27/2017, 08/27/2020   Moderna Sars-Covid-2 Vaccination 11/16/2019, 12/16/2019, 09/16/2020   Pneumococcal Conjugate-13 09/22/2015  Pneumococcal Polysaccharide-23 08/29/2006   Td 10/07/2007   Zoster Recombinat (Shingrix) 02/12/2018, 05/13/2018   Zoster, Live 03/12/2008   Pertinent  Health Maintenance Due  Topic Date Due   INFLUENZA VACCINE  06/06/2022   DEXA SCAN  Completed      08/23/2020    9:56 AM 08/23/2020   10:33 PM 08/26/2020    6:15 AM 09/23/2020   11:32 AM 09/27/2021   10:28 AM  Fall Risk  Falls in the past year?   1 1 0  Was there an injury with Fall?   1 1 0  Was there an injury with Fall? - Comments    Patient fractured pelvis and had a stroke   Fall Risk Category Calculator   2 2 0  Fall Risk Category   Moderate Moderate Low  Patient Fall Risk Level High fall risk High fall risk  Moderate fall risk Low fall risk  Patient at Risk for Falls Due to    History of fall(s);Impaired balance/gait;Impaired mobility;Orthopedic patient  No Fall Risks  Fall risk Follow up   Falls evaluation completed;Education provided;Falls prevention discussed;Follow up appointment  Falls evaluation completed   Functional Status Survey:    Vitals:   05/25/22 1156  BP: 117/67  Pulse: 63  Resp: 14  Temp: 97.7 F (36.5 C)  SpO2: 97%  Weight: 130 lb 12.8 oz (59.3 kg)  Height: '5\' 6"'$  (1.676 m)   Body mass index is 21.11 kg/m. Physical Exam Vitals and nursing note reviewed.  Constitutional:      General: She is not in acute distress.    Appearance: She is not diaphoretic.  HENT:     Head: Normocephalic and atraumatic.  Neck:     Vascular: No JVD.  Cardiovascular:     Rate and Rhythm: Normal rate. Rhythm irregular.     Heart sounds: No murmur heard. Pulmonary:     Effort: Pulmonary effort is normal. No respiratory distress.     Breath sounds: Normal breath sounds. No wheezing.  Skin:    General: Skin is warm and dry.  Neurological:     Mental Status: She is alert and oriented to person, place, and time.    Labs reviewed: Recent Labs    02/02/22 0000 04/19/22 0000 05/08/22 0000  NA 135* 136* 142  K 4.1 4.3 4.4  CL 93* 95* 102  CO2 23* 29* 29*  BUN 25* 23* 24*  CREATININE 1.1 1.1 1.2*  CALCIUM 10.0 9.3 9.2   Recent Labs    08/16/21 0000 09/26/21 0000  AST 30 15  ALT 23 8  ALKPHOS 87 90  ALBUMIN 3.1* 3.6   Recent Labs    09/26/21 0000 01/13/22 0000 02/02/22 0000  WBC 6.7 6.2 7.4  HGB 11.5* 10.4* 10.8*  HCT 36 31* 32*  PLT 125* 143* 138*   Lab Results  Component Value Date   TSH 2.87 02/03/2022   Lab Results  Component Value Date   HGBA1C 5.7 (H) 08/19/2020   Lab Results  Component Value Date   CHOL 180 09/26/2021   HDL 44 09/26/2021   LDLCALC 122 09/26/2021   LDLDIRECT 81.3 08/19/2020   TRIG 70 09/26/2021   CHOLHDL 2 09/12/2010    Significant Diagnostic Results in last 30 days:  No results  found.  Assessment/Plan  1. Essential hypertension Controlled Continue norvasc, hctz, k supplement, atenolol, and losartan   2. Chronic right-sided low back pain with right-sided sciatica Improved Continue tylenol and voltaren   3. Permanent atrial  fibrillation ([place]) Rate is controlled Not on eliquis due to bleeding On Asa Rate is controlled.      4. Slow transit constipation Continue miralax.   5. Thrombocytopenia ([place]) Lab Results  Component Value Date   PLT 138 (A) 02/02/2022   Continue to monitor.  6. Mixed hyperlipidemia Lab Results  Component Value Date   CHOL 180 09/26/2021   HDL 44 09/26/2021   LDLCALC 122 09/26/2021   LDLDIRECT 81.3 08/19/2020   TRIG 70 09/26/2021   CHOLHDL 2 09/12/2010   Not on statin due to intolerance.   Family/ staff Communication: resident   Labs/tests ordered:  NA

## 2022-05-30 ENCOUNTER — Encounter: Payer: Self-pay | Admitting: Podiatry

## 2022-05-30 ENCOUNTER — Ambulatory Visit (INDEPENDENT_AMBULATORY_CARE_PROVIDER_SITE_OTHER): Payer: Medicare Other | Admitting: Podiatry

## 2022-05-30 ENCOUNTER — Telehealth: Payer: Self-pay

## 2022-05-30 DIAGNOSIS — D696 Thrombocytopenia, unspecified: Secondary | ICD-10-CM

## 2022-05-30 DIAGNOSIS — L84 Corns and callosities: Secondary | ICD-10-CM

## 2022-05-30 DIAGNOSIS — M79675 Pain in left toe(s): Secondary | ICD-10-CM | POA: Diagnosis not present

## 2022-05-30 DIAGNOSIS — M79674 Pain in right toe(s): Secondary | ICD-10-CM

## 2022-05-30 DIAGNOSIS — L600 Ingrowing nail: Secondary | ICD-10-CM

## 2022-05-30 DIAGNOSIS — M2041 Other hammer toe(s) (acquired), right foot: Secondary | ICD-10-CM

## 2022-05-30 DIAGNOSIS — B351 Tinea unguium: Secondary | ICD-10-CM | POA: Diagnosis not present

## 2022-05-30 DIAGNOSIS — M2042 Other hammer toe(s) (acquired), left foot: Secondary | ICD-10-CM

## 2022-05-30 DIAGNOSIS — I639 Cerebral infarction, unspecified: Secondary | ICD-10-CM

## 2022-05-30 NOTE — Patient Instructions (Signed)
To Nursing:  Apply triple antibiotic ointment to right great toe once daily for 10 days. It is okay to leave open to air.  Also discontinue brown toe pad for right 5th digit.  Start applying silicone toe cap to right 5th digit every morning. Remove every evening.

## 2022-05-30 NOTE — Telephone Encounter (Signed)
Patient in office today for [place] with Dr. Elisha Ponder. Patient was accompanied by her daughter who inform me that she had paid out of pocket for a pair of diabetic shoes that she would like a refund for. The shoes have been returned unworn with all original packaging and items. Shoes will be [facility] to [facility]. Advised patient and daughter that I would forward the refund request to the billing department. Patient had a receipt stating the payment was processed for $190.

## 2022-05-31 NOTE — Progress Notes (Signed)
Subjective:  Patient ID: Susan Conway, female    DOB: 02-13-1929,  MRN: [id number]  TOMARA YOUNGBERG presents to clinic today for painful elongated mycotic toenails 1-5 bilaterally which are tender when wearing enclosed shoe gear. Pain is relieved with periodic professional debridement. Patient states right great toe is tender.  Patient is accompanied by her daughter on today's visit. Ms. Benbrook also relates pain of right 5th digit due to corn. She also relates discoloration of right 5th digit where she has been applying tan toe pad for protection from painful corn formation.  She has brought back a pair of diabetic shoes she purchased from our office.   PCP is [name], MD , and last visit was  April 24, 2022  Allergies  Allergen Reactions   Iodinated Contrast Media Nausea Only and Other (See Comments)    Severe nausea and also passed out   Iodine    Other     Seasonal Allergies.   Bactrim Rash   Relafen [Nabumetone] Rash   Sulfanilamide Rash    Review of Systems: Negative except as noted in the HPI.  Objective:  Ms. Filosa is a pleasant 88 y.o. female, WD, WN in NAD. AAO x 3.  Vascular Examination: Vascular status intact b/l with palpable pedal pulses. Pedal hair absent b/l. CFT <3 seconds b/l. Trace dependent edema LLE. No pain with calf compression b/l. Skin temperature gradient WNL b/l.   Neurological Examination: Sensation grossly intact b/l with 10 gram monofilament. Vibratory sensation intact b/l.   Dermatological Examination: Pedal skin warm and supple b/l. Toenails 1-5 b/l thick, discolored, elongated with subungual debris. Right great toe is incurvated at medial border and tender to palpation. There is early inflammation noted distal tip of digit medially. There is no purulence nor drainage noted. Mild hyperkeratosis to distal medial corner. Hyperkeratotic lesion(s) dorsal DIPJ of R 5th toe.  No erythema, no edema, no drainage, no fluctuance.  Musculoskeletal  Examination: Noted disuse atrophy left lower extremity. Muscle strength 5/5 to all LE muscle groups of right lower extremity. Flaccid lower extremity noted left lower extremity. Adductovarus deformity R 5th toe.  Radiographs: None  Assessment/Plan: 1. Pain due to onychomycosis of toenails of both feet   2. Ingrown toenail without infection   3. Corns   4. Acquired hammertoes of both feet   5. Thrombocytopenia ([place])     -Patient was evaluated and treated. All patient's and/or POA's questions/concerns answered on today's visit. -Examined patient. -Patient returned purchased diabetic shoes. Please see RN Tonika's documented note. -Toenails 2-5 bilaterally and left great toe debrided in length and girth without iatrogenic bleeding with sterile nail nipper and dremel. Patient prefers her toenails very short. I informed her toenails cut too short contribute to formation of ingrown toenails. She wanted her toenails smoothed more. After two attempts, I requested RN Joycelyn Schmid to assist with this. -Offending nail border debrided and curretaged right great toe utilizing sterile nail nipper and currette. Border(s) cleansed with alcohol and TAO applied. Patient/POA/Caregiver/Facility instructed to apply triple antibiotic ointment  to R hallux once daily for 10 days. Call office if there are any concerns. -Order also written to discontinue brown toe pad for right 5th digit.. We will use another pad as I feel this is a bit constrictive to her 5th toe. -Dispensed siliclone toe cap. Apply to R 5th toe every morning. Remove every evening. -Patient/POA to call should there be question/concern in the interim.   Return in about 3 months (around 08/30/2022).  Marzetta Board, DPM

## 2022-06-02 ENCOUNTER — Ambulatory Visit (INDEPENDENT_AMBULATORY_CARE_PROVIDER_SITE_OTHER): Payer: Medicare Other

## 2022-06-02 DIAGNOSIS — I442 Atrioventricular block, complete: Secondary | ICD-10-CM | POA: Diagnosis not present

## 2022-06-04 LAB — CUP PACEART REMOTE DEVICE CHECK
Battery Remaining Longevity: 113 mo
Battery Remaining Percentage: 82 %
Battery Voltage: 3.01 V
Brady Statistic RV Percent Paced: 10 %
Date Time Interrogation Session: 20230728020014
Implantable Lead Implant Date: 20200720
Implantable Lead Location: 753860
Implantable Pulse Generator Implant Date: 20200720
Lead Channel Impedance Value: 540 Ohm
Lead Channel Pacing Threshold Amplitude: 1 V
Lead Channel Pacing Threshold Pulse Width: 0.5 ms
Lead Channel Sensing Intrinsic Amplitude: 3 mV
Lead Channel Setting Pacing Amplitude: 2.5 V
Lead Channel Setting Pacing Pulse Width: 0.5 ms
Lead Channel Setting Sensing Sensitivity: 2 mV
Pulse Gen Model: 1272
Pulse Gen Serial Number: 9122450

## 2022-06-21 NOTE — Progress Notes (Signed)
Remote pacemaker transmission.   

## 2022-07-04 ENCOUNTER — Encounter: Payer: Self-pay | Admitting: Orthopedic Surgery

## 2022-07-04 ENCOUNTER — Non-Acute Institutional Stay (SKILLED_NURSING_FACILITY): Payer: Medicare Other | Admitting: Orthopedic Surgery

## 2022-07-04 DIAGNOSIS — D696 Thrombocytopenia, unspecified: Secondary | ICD-10-CM

## 2022-07-04 DIAGNOSIS — E782 Mixed hyperlipidemia: Secondary | ICD-10-CM | POA: Diagnosis not present

## 2022-07-04 DIAGNOSIS — I69354 Hemiplegia and hemiparesis following cerebral infarction affecting left non-dominant side: Secondary | ICD-10-CM

## 2022-07-04 DIAGNOSIS — D692 Other nonthrombocytopenic purpura: Secondary | ICD-10-CM | POA: Diagnosis not present

## 2022-07-04 DIAGNOSIS — I10 Essential (primary) hypertension: Secondary | ICD-10-CM

## 2022-07-04 DIAGNOSIS — I4430 Unspecified atrioventricular block: Secondary | ICD-10-CM

## 2022-07-04 DIAGNOSIS — M5441 Lumbago with sciatica, right side: Secondary | ICD-10-CM

## 2022-07-04 DIAGNOSIS — G8929 Other chronic pain: Secondary | ICD-10-CM | POA: Diagnosis not present

## 2022-07-04 DIAGNOSIS — I4821 Permanent atrial fibrillation: Secondary | ICD-10-CM

## 2022-07-04 NOTE — Progress Notes (Signed)
Location:   [facility] Room Number: 118-A Place of Service:  SNF [phone]) Provider:  Windell Moulding, NP  PCP: [name], MD  Patient Care Team: [name], MD as PCP - General (Internal Medicine) [name], MD as PCP - Electrophysiology (Cardiology) [name], MD as Consulting Physician (Dermatology) [name], MD as Consulting Physician (Cardiology) [name], MD as Consulting Physician (Dermatology)  Extended Emergency Contact Information Primary Emergency Contact: [name] of [place] Phone: [phone] Mobile Phone: [phone] Relation: Daughter Secondary Emergency Contact: [name],[name]  [name] of [place] Phone: [phone] Relation: Son  Code Status: DNR  Goals of care: Advanced Directive information    07/04/2022   10:23 AM  Advanced Directives  Does Patient Have a Medical Advance Directive? Yes  Type of Paramedic of [place];Living will;Out of facility DNR (pink MOST or yellow form)  Does patient want to make changes to medical advance directive? No - Patient declined  Copy of [place] in Chart? Yes - validated most recent copy scanned in chart (See row information)     Chief Complaint  Patient presents with   Medical Management of Chronic Issues    Routine Visit.   Immunizations    Discuss the need for Tetanus vaccine, and Influenza vaccine.     HPI:  Pt is a 88 y.o. female seen today for medical management of chronic diseases.      She currently resides on the skilled nursing unit at [facility]. Past medical history includes: atrial fibrillation, AV block, CAD, hypertension, GERD, stroke, left sided paralysis, constipation, and dysphagia.    Left sided weakness- CVA (right basal ganglia embolic stroke) 93/8182, lives in skilled nursing, ambulates with PWC, remains on asa (h/o GI  bleed on coumadin and ruptured Bakers cyst on Eliquis), not on statin due to intolerance, followed by PT HTN- BUN/creat 22.6/1.11 04/19/2022, remains on atenolol, amlodipine, losartan and HCTZ, Hydralazine prn for SBP > 170 HLD- LDL 122 09/2021, not on statin due to intolerance Atrial fibrillation- HR controlled with Digoxin- (dig level 0.82 04/25/2022), TSH 2.87 02/02/2022, remains on asa for clot prevention AV block- s/p pacemaker, last check 07/28 Thrombocytopenia- platelets 138 02/02/2022 Chronic back pain- R>L, improved since getting new bed, remains on tylenol/voltaren gel prn, lidocaine patches daily  No recent falls or injuries. Celebrated 89st birthday yesterday and over weekend.   Recent blood pressures:  08/28- 149/81  08/26- 147/81  08/24- 126/82  Recent weights:  08/01- 127.8 lbs  06/01- 130 lbs  05/01- 129.8 lbs     Past Medical History:  Diagnosis Date   Actinic keratosis 05/25/2014   Anxiety    Atrial fibrillation ([place]) 09/21/2010   Basal cell carcinoma 05/25/2014   Multiple removed by Dr. Danella Sensing in March 2015: right neck, left neck, scalp    Bell's palsy 07/18/1979   Cervicalgia 01/31/2012   Closed fracture of lumbar vertebra without mention of spinal cord injury 07/17/2005   Conjunctiva disorder 12/26/2010   Coronary atherosclerosis of native coronary artery 07/17/1997   Cramp of limb 08/16/2009   Degeneration of lumbar or lumbosacral intervertebral disc 01/31/2012   Disturbance of skin sensation 08/2009   Dizziness and giddiness 11/08/2011   vertigo   External hemorrhoids without mention of complication 9/93/7169   External hemorrhoids without mention of complication 67/89/3810   Ganglion of tendon sheath 08/16/2009   Leg cramp 10/27/2013   Most frequently  the right leg.    Long term (current) use of anticoagulants 09/2010   Lumbago 07/2009   Meralgia paresthetica 07/2007   MI, old    Myalgia and myositis, unspecified 01/17/2012   Osteoporosis    Other  abnormal blood chemistry 04/08/2012   Other abnormal blood chemistry 2013   hyperglycemia   Other disorder of muscle, ligament, and fascia 04/08/2012   Other specified cardiac dysrhythmias(427.89) 06/27/2010   Pacemaker 05/06/2020   Pain in joint, ankle and foot 10/27/2013   Bilateral since 1998    Pain in joint, shoulder region 08/12/2012   Pain in joint, upper arm 12/26/2010   Pain in limb 01/11/2011   Pain in thoracic spine 01/31/2012   Pathologic fracture of vertebrae 01/22/2012   PVC's (premature ventricular contractions)    Rash and other nonspecific skin eruption 08/02/2011   Senile osteoporosis 07/17/1993   Stroke ([place])    Unspecified essential hypertension 07/17/1997   Varicose veins of lower extremities 08/16/2009   Varicose veins of lower extremities 08/16/2009   Past Surgical History:  Procedure Laterality Date   COLONOSCOPY WITH PROPOFOL N/A 10/19/2015   Procedure: COLONOSCOPY WITH PROPOFOL;  Surgeon: Gatha Mayer, MD;  Location: [facility];  Service: Endoscopy;  Laterality: N/A;   CORONARY ARTERY BYPASS GRAFT  1998   x2; LIMA to LAD; SVG to diagonal off bypass   HEMORRHOID SURGERY  08/26/2012   Dr. Brantley Stage   LOOP RECORDER INSERTION N/A 05/13/2018   Procedure: LOOP RECORDER INSERTION;  Surgeon: [name], MD;  Location: [facility];  Service: Cardiovascular;  Laterality: N/A;   LOOP RECORDER REMOVAL N/A 05/26/2019   Procedure: LOOP RECORDER REMOVAL;  Surgeon: [name], MD;  Location: [facility];  Service: Cardiovascular;  Laterality: N/A;   MASS EXCISION Left 11/23/2015   Procedure: LEFT WRIST EXCISION CYST;  Surgeon: Leanora Cover, MD;  Location: [facility];  Service: Orthopedics;  Laterality: Left;   PACEMAKER IMPLANT N/A 05/26/2019   Procedure: PACEMAKER IMPLANT;  Surgeon: [name], MD;  Location: [facility];  Service: Cardiovascular;  Laterality: N/A;   SKIN BIOPSY  01/29/14   (R) neck; (R) scalp, 2 (L) neck; shave biopsy  Superficial basal cell carcinoma Dr. Danella Sensing   TONSILLECTOMY  [phone]    Allergies  Allergen Reactions   Iodinated Contrast Media Nausea Only and Other (See Comments)    Severe nausea and also passed out   Iodine    Lodine [Etodolac]    Other     Seasonal Allergies.   Bactrim Rash   Relafen [Nabumetone] Rash   Sulfanilamide Rash    Allergies as of 07/04/2022       Reactions   Iodinated Contrast Media Nausea Only, Other (See Comments)   Severe nausea and also passed out   Iodine    Lodine [etodolac]    Other    Seasonal Allergies.   Bactrim Rash   Relafen [nabumetone] Rash   Sulfanilamide Rash        Medication List        Accurate as of July 04, 2022 10:23 AM. If you have any questions, ask your nurse or doctor.          acetaminophen 325 MG tablet Commonly known as: TYLENOL Take 650 mg by mouth 3 (three) times daily as needed.   amLODipine 5 MG tablet Commonly known as: NORVASC Take 5 mg by mouth daily.   aspirin EC 81 MG tablet Take  1 tablet (81 mg total) by mouth daily. Swallow whole.   atenolol 50 MG tablet Commonly known as: TENORMIN Take 50 mg by mouth 2 (two) times daily. HOLD FOR SPB <90 or HR< 60   calcium-vitamin D 500-200 MG-UNIT tablet Commonly known as: OSCAL WITH D Take 1 tablet by mouth daily.   diclofenac Sodium 1 % Gel Commonly known as: VOLTAREN Apply 4 g topically 4 (four) times daily as needed. Left knee, right knee, left ankle, back or side   digoxin 0.125 MG tablet Commonly known as: LANOXIN Take 0.125 mg by mouth every other day.   docusate sodium 100 MG capsule Commonly known as: Colace Take 1 capsule (100 mg total) by mouth daily.   hydrALAZINE 10 MG tablet Commonly known as: APRESOLINE Take 1 tablet (10 mg total) by mouth 2 (two) times daily as needed (for SBP> 170).   hydrochlorothiazide 50 MG tablet Commonly known as: HYDRODIURIL Take 25 mg by mouth daily.   levocetirizine 5 MG tablet Commonly known  as: XYZAL Take 5 mg by mouth at bedtime.   lidocaine 5 % Commonly known as: LIDODERM Place 1 patch onto the skin as needed. Remove & Discard patch within 12 hours or as directed by MD   LORazepam 1 MG tablet Commonly known as: ATIVAN Take 1 tablet (1 mg total) by mouth at bedtime.   losartan 25 MG tablet Commonly known as: COZAAR Take 25 mg by mouth daily.   ondansetron 4 MG tablet Commonly known as: ZOFRAN Take 1 tablet (4 mg total) by mouth every 6 (six) hours as needed for nausea.   polyethylene glycol 17 g packet Commonly known as: MIRALAX / GLYCOLAX Take 17 g by mouth daily.   potassium chloride SA 20 MEQ tablet Commonly known as: KLOR-CON M Take 10 mEq by mouth daily.        Review of Systems  Constitutional:  Negative for activity change, appetite change, chills, fatigue and fever.  HENT:  Negative for congestion and trouble swallowing.   Eyes:  Negative for visual disturbance.  Respiratory:  Negative for cough, shortness of breath and wheezing.   Cardiovascular:  Negative for chest pain and leg swelling.  Gastrointestinal:  Positive for constipation. Negative for abdominal distention, abdominal pain, diarrhea, nausea and vomiting.  Genitourinary:  Negative for dysuria, frequency and hematuria.  Musculoskeletal:  Positive for arthralgias, back pain and gait problem.  Skin:  Positive for wound.  Neurological:  Positive for weakness. Negative for dizziness and headaches.  Psychiatric/Behavioral:  Negative for confusion, dysphoric mood and sleep disturbance. The patient is not nervous/anxious.     Immunization History  Administered Date(s) Administered   Influenza Whole 08/06/2012   Influenza, High Dose Seasonal PF 08/15/2019, 08/27/2020   Influenza,inj,Quad PF,6+ Mos 08/30/2018   Influenza-Unspecified 08/06/2013, 08/24/2014, 08/26/2015, 08/31/2016, 08/27/2017, 08/27/2020   Moderna Sars-Covid-2 Vaccination 11/16/2019, 12/16/2019, 09/16/2020   Pneumococcal  Conjugate-13 09/22/2015   Pneumococcal Polysaccharide-23 08/29/2006   Td 10/07/2007   Zoster Recombinat (Shingrix) 02/12/2018, 05/13/2018   Zoster, Live 03/12/2008   Pertinent  Health Maintenance Due  Topic Date Due   INFLUENZA VACCINE  06/06/2022   DEXA SCAN  Completed      08/23/2020    9:56 AM 08/23/2020   10:33 PM 08/26/2020    6:15 AM 09/23/2020   11:32 AM 09/27/2021   10:28 AM  Fall Risk  Falls in the past year?   1 1 0  Was there an injury with Fall?   1 1 0  Was there an injury with Fall? - Comments    Patient fractured pelvis and had a stroke   Fall Risk Category Calculator   2 2 0  Fall Risk Category   Moderate Moderate Low  Patient Fall Risk Level High fall risk High fall risk  Moderate fall risk Low fall risk  Patient at Risk for Falls Due to   History of fall(s);Impaired balance/gait;Impaired mobility;Orthopedic patient  No Fall Risks  Fall risk Follow up   Falls evaluation completed;Education provided;Falls prevention discussed;Follow up appointment  Falls evaluation completed   Functional Status Survey:    Vitals:   07/04/22 1014  BP: (!) 149/81  Pulse: 89  Resp: 18  Temp: 97.9 F (36.6 C)  SpO2: 97%  Weight: 127 lb 12.8 oz (58 kg)  Height: '5\' 6"'$  (1.676 m)   Body mass index is 20.63 kg/m. Physical Exam Vitals reviewed.  Constitutional:      General: She is not in acute distress. HENT:     Head: Normocephalic.     Right Ear: There is no impacted cerumen.     Left Ear: There is no impacted cerumen.     Nose: Nose normal.     Mouth/Throat:     Mouth: Mucous membranes are moist.  Eyes:     General:        Right eye: No discharge.        Left eye: No discharge.  Cardiovascular:     Rate and Rhythm: Normal rate. Rhythm irregular.     Pulses: Normal pulses.     Heart sounds: Normal heart sounds.  Pulmonary:     Effort: Pulmonary effort is normal. No respiratory distress.     Breath sounds: Normal breath sounds. No wheezing.  Abdominal:      General: Bowel sounds are normal. There is no distension.     Palpations: Abdomen is soft.     Tenderness: There is no abdominal tenderness.  Musculoskeletal:     Cervical back: Neck supple.     Right lower leg: No edema.     Left lower leg: No edema.  Skin:    General: Skin is warm and dry.     Capillary Refill: Capillary refill takes less than 2 seconds.     Comments: Mild bruising to LLE, several non tender purple lesions to extremities- vary in size  Neurological:     General: No focal deficit present.     Mental Status: She is alert. Mental status is at baseline.     Motor: Weakness present.     Gait: Gait abnormal.     Comments: PWC, mild dysarthria  Psychiatric:        Mood and Affect: Mood normal.        Behavior: Behavior normal.     Comments: Veyr pleasant, follows commands, alert to self/person/place/situation     Labs reviewed: Recent Labs    02/02/22 0000 04/19/22 0000 05/08/22 0000  NA 135* 136* 142  K 4.1 4.3 4.4  CL 93* 95* 102  CO2 23* 29* 29*  BUN 25* 23* 24*  CREATININE 1.1 1.1 1.2*  CALCIUM 10.0 9.3 9.2   Recent Labs    08/16/21 0000 09/26/21 0000  AST 30 15  ALT 23 8  ALKPHOS 87 90  ALBUMIN 3.1* 3.6   Recent Labs    09/26/21 0000 01/13/22 0000 02/02/22 0000  WBC 6.7 6.2 7.4  HGB 11.5* 10.4* 10.8*  HCT 36 31* 32*  PLT 125* 143* 138*  Lab Results  Component Value Date   TSH 2.87 02/03/2022   Lab Results  Component Value Date   HGBA1C 5.7 (H) 08/19/2020   Lab Results  Component Value Date   CHOL 180 09/26/2021   HDL 44 09/26/2021   LDLCALC 122 09/26/2021   LDLDIRECT 81.3 08/19/2020   TRIG 70 09/26/2021   CHOLHDL 2 09/12/2010    Significant Diagnostic Results in last 30 days:  No results found.  Assessment/Plan 1. 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 Essential hypertension - controlled - cont  atenolol, amlodipine, losartan and HCTZ, - cont hydralazine for SBP> 170  3. Mixed hyperlipidemia - LDL 122 09/2021 - unable to tolerate statin  4. Permanent atrial fibrillation (HCC) - HR controlled with Digoxin - off anticoagulant due to h/o GI bleed - cont asa  5. AV block - s/p pacemaker  6. Thrombocytopenia ([facility]) - platelets stable  7. Chronic right-sided low back pain with right-sided sciatica - improved with new mattress - cont tylenol/volteren gel prn - cont lidocaine patches  8. Senile purpura ([place]) - education given    Family/ staff Communication: plan discussed with patient and nurse  Labs/tests ordered: none

## 2022-07-31 ENCOUNTER — Non-Acute Institutional Stay (SKILLED_NURSING_FACILITY): Payer: Medicare Other | Admitting: Internal Medicine

## 2022-07-31 ENCOUNTER — Encounter: Payer: Self-pay | Admitting: Internal Medicine

## 2022-07-31 DIAGNOSIS — K5901 Slow transit constipation: Secondary | ICD-10-CM

## 2022-07-31 DIAGNOSIS — E782 Mixed hyperlipidemia: Secondary | ICD-10-CM | POA: Diagnosis not present

## 2022-07-31 DIAGNOSIS — I69354 Hemiplegia and hemiparesis following cerebral infarction affecting left non-dominant side: Secondary | ICD-10-CM | POA: Diagnosis not present

## 2022-07-31 DIAGNOSIS — I4821 Permanent atrial fibrillation: Secondary | ICD-10-CM | POA: Diagnosis not present

## 2022-07-31 DIAGNOSIS — N1831 Chronic kidney disease, stage 3a: Secondary | ICD-10-CM

## 2022-07-31 DIAGNOSIS — G8929 Other chronic pain: Secondary | ICD-10-CM | POA: Diagnosis not present

## 2022-07-31 DIAGNOSIS — I10 Essential (primary) hypertension: Secondary | ICD-10-CM | POA: Diagnosis not present

## 2022-07-31 DIAGNOSIS — Z8781 Personal history of (healed) traumatic fracture: Secondary | ICD-10-CM | POA: Diagnosis not present

## 2022-07-31 DIAGNOSIS — M5441 Lumbago with sciatica, right side: Secondary | ICD-10-CM

## 2022-07-31 DIAGNOSIS — D631 Anemia in chronic kidney disease: Secondary | ICD-10-CM | POA: Diagnosis not present

## 2022-07-31 NOTE — Progress Notes (Unsigned)
Location:  [place] Room Number: [facility] 118A Place of Service:  [phone]) Provider:  Dr. Clydene Fake, MD  Patient Care Team: [name], MD as PCP - General (Internal Medicine) [name], MD as PCP - Electrophysiology (Cardiology) [name], MD as Consulting Physician (Dermatology) [name], MD as Consulting Physician (Cardiology) [facility] [name], MD as Consulting Physician (Dermatology)  Extended Emergency Contact Information Primary Emergency Contact: [name] of [place] Phone: [phone] Mobile Phone: [phone] Relation: Daughter Secondary Emergency Contact: [name],[name]  [name] of [place] Phone: [phone] Relation: Son  Code Status:  DNR Goals of care: Advanced Directive information    07/04/2022   10:23 AM  Advanced Directives  Does Patient Have a Medical Advance Directive? Yes  Type of Paramedic of [place];Living will;Out of facility DNR (pink MOST or yellow form)  Does patient want to make changes to medical advance directive? No - Patient declined  Copy of [place] in Chart? Yes - validated most recent copy scanned in chart (See row information)     Chief Complaint  Patient presents with   Medical Management of Chronic Issues    Medical Management of Chronic Issues.     HPI:  Pt is a 88 y.o. female seen today for medical management of chronic diseases.    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 Danella Sensing in March 2015: right neck, left neck, scalp    Bell's palsy 07/18/1979   Cervicalgia 01/31/2012   Closed fracture of lumbar vertebra without mention of spinal cord injury 07/17/2005   Conjunctiva disorder 12/26/2010   Coronary atherosclerosis of native coronary artery 07/17/1997   Cramp of limb 08/16/2009   Degeneration of lumbar or lumbosacral intervertebral disc 01/31/2012   Disturbance of skin sensation 08/2009   Dizziness and giddiness 11/08/2011   vertigo   External hemorrhoids without mention of complication 9/70/2637   External hemorrhoids without mention of complication 85/88/5027   Ganglion of tendon sheath 08/16/2009   Leg cramp 10/27/2013   Most frequently the right leg.    Long term (current) use of anticoagulants 09/2010   Lumbago 07/2009   Meralgia paresthetica 07/2007   MI, old    Myalgia and myositis, unspecified 01/17/2012   Osteoporosis    Other abnormal blood chemistry 04/08/2012   Other abnormal blood chemistry 2013   hyperglycemia   Other disorder of muscle, ligament, and fascia 04/08/2012   Other specified cardiac dysrhythmias(427.89) 06/27/2010   Pacemaker 05/06/2020   Pain in joint, ankle and foot 10/27/2013   Bilateral since 1998    Pain in joint, shoulder region 08/12/2012   Pain in joint, upper arm 12/26/2010  Pain in limb 01/11/2011   Pain in thoracic spine 01/31/2012   Pathologic fracture of vertebrae 01/22/2012   PVC's (premature ventricular contractions)    Rash and other nonspecific skin eruption 08/02/2011   Senile osteoporosis 07/17/1993   Stroke ([facility])    Unspecified essential hypertension 07/17/1997   Varicose veins of lower extremities 08/16/2009    Varicose veins of lower extremities 08/16/2009   Past Surgical History:  Procedure Laterality Date   COLONOSCOPY WITH PROPOFOL N/A 10/19/2015   Procedure: COLONOSCOPY WITH PROPOFOL;  Surgeon: Gatha Mayer, MD;  Location: [facility];  Service: Endoscopy;  Laterality: N/A;   CORONARY ARTERY BYPASS GRAFT  1998   x2; LIMA to LAD; SVG to diagonal off bypass   HEMORRHOID SURGERY  08/26/2012   Dr. Brantley Stage   LOOP RECORDER INSERTION N/A 05/13/2018   Procedure: LOOP RECORDER INSERTION;  Surgeon: [name], MD;  Location: [facility];  Service: Cardiovascular;  Laterality: N/A;   LOOP RECORDER REMOVAL N/A 05/26/2019   Procedure: LOOP RECORDER REMOVAL;  Surgeon: [name], MD;  Location: [facility];  Service: Cardiovascular;  Laterality: N/A;   MASS EXCISION Left 11/23/2015   Procedure: LEFT WRIST EXCISION CYST;  Surgeon: Leanora Cover, MD;  Location: [facility];  Service: Orthopedics;  Laterality: Left;   PACEMAKER IMPLANT N/A 05/26/2019   Procedure: PACEMAKER IMPLANT;  Surgeon: [name], MD;  Location: [facility];  Service: Cardiovascular;  Laterality: N/A;   SKIN BIOPSY  01/29/14   (R) neck; (R) scalp, 2 (L) neck; shave biopsy Superficial basal cell carcinoma Dr. Danella Sensing   TONSILLECTOMY  [phone]    Allergies  Allergen Reactions   Iodinated Contrast Media Nausea Only and Other (See Comments)    Severe nausea and also passed out   Iodine    Lodine [Etodolac]    Other     Seasonal Allergies.   Bactrim Rash   Relafen [Nabumetone] Rash   Sulfanilamide Rash    Outpatient Encounter Medications as of 07/31/2022  Medication Sig   acetaminophen (TYLENOL) 325 MG tablet Take 650 mg by mouth 3 (three) times daily as needed.   amLODipine (NORVASC) 5 MG tablet Take 5 mg by mouth daily.   aspirin EC 81 MG EC tablet Take 1 tablet (81 mg total) by mouth daily. Swallow whole.   atenolol (TENORMIN) 50 MG tablet Take 50 mg by mouth 2 (two) times  daily. HOLD FOR SPB <90 or HR< 60   calcium-vitamin D (OSCAL WITH D) 500-200 MG-UNIT tablet Take 1 tablet by mouth daily.   diclofenac Sodium (VOLTAREN) 1 % GEL Apply 4 g topically 4 (four) times daily as needed. Left knee, right knee, left ankle, back or side   digoxin (LANOXIN) 0.125 MG tablet Take 0.125 mg by mouth every other day.   docusate sodium (COLACE) 100 MG capsule Take 1 capsule (100 mg total) by mouth daily.   hydrALAZINE (APRESOLINE) 10 MG tablet Take 1 tablet (10 mg total) by mouth 2 (two) times daily as needed (for SBP> 170).   hydrochlorothiazide (HYDRODIURIL) 50 MG tablet Take 25 mg by mouth daily.   levocetirizine (XYZAL) 5 MG tablet Take 5 mg by mouth at bedtime.   lidocaine (LIDODERM) 5 % Place 1 patch onto the skin as needed. Remove & Discard patch within 12 hours or as directed by MD   LORazepam (ATIVAN) 1 MG tablet Take 1 tablet (1 mg total) by mouth at bedtime.   losartan (  COZAAR) 25 MG tablet Take 25 mg by mouth daily.   ondansetron (ZOFRAN) 4 MG tablet Take 1 tablet (4 mg total) by mouth every 6 (six) hours as needed for nausea.   polyethylene glycol (MIRALAX / GLYCOLAX) 17 g packet Take 17 g by mouth daily.   potassium chloride SA (KLOR-CON) 20 MEQ tablet Take 10 mEq by mouth daily.   No facility-administered encounter medications on file as of 07/31/2022.    Review of Systems  Constitutional:  Negative for activity change and appetite change.  HENT: Negative.    Respiratory:  Negative for cough and shortness of breath.   Cardiovascular:  Negative for leg swelling.  Gastrointestinal:  Negative for constipation.  Genitourinary: Negative.   Musculoskeletal:  Positive for arthralgias and gait problem. Negative for myalgias.  Skin: Negative.   Neurological:  Negative for dizziness and weakness.  Psychiatric/Behavioral:  Negative for confusion, dysphoric mood and sleep disturbance.     Immunization History  Administered Date(s) Administered   Influenza Whole  08/06/2012   Influenza, High Dose Seasonal PF 08/15/2019, 08/27/2020   Influenza,inj,Quad PF,6+ Mos 08/30/2018   Influenza-Unspecified 08/06/2013, 08/24/2014, 08/26/2015, 08/31/2016, 08/27/2017, 08/27/2020   Moderna Sars-Covid-2 Vaccination 11/16/2019, 12/16/2019, 09/16/2020   Pneumococcal Conjugate-13 09/22/2015   Pneumococcal Polysaccharide-23 08/29/2006   Td 10/07/2007   Zoster Recombinat (Shingrix) 02/12/2018, 05/13/2018   Zoster, Live 03/12/2008   Pertinent  Health Maintenance Due  Topic Date Due   INFLUENZA VACCINE  06/06/2022   DEXA SCAN  Completed      08/23/2020    9:56 AM 08/23/2020   10:33 PM 08/26/2020    6:15 AM 09/23/2020   11:32 AM 09/27/2021   10:28 AM  Fall Risk  Falls in the past year?   1 1 0  Was there an injury with Fall?   1 1 0  Was there an injury with Fall? - Comments    Patient fractured pelvis and had a stroke   Fall Risk Category Calculator   2 2 0  Fall Risk Category   Moderate Moderate Low  Patient Fall Risk Level High fall risk High fall risk  Moderate fall risk Low fall risk  Patient at Risk for Falls Due to   History of fall(s);Impaired balance/gait;Impaired mobility;Orthopedic patient  No Fall Risks  Fall risk Follow up   Falls evaluation completed;Education provided;Falls prevention discussed;Follow up appointment  Falls evaluation completed   Functional Status Survey:    Vitals:   07/31/22 1051  BP: (!) 151/86  Pulse: 69  Resp: 17  Temp: (!) 97.5 F (36.4 C)  SpO2: 94%  Weight: 128 lb (58.1 kg)  Height: '5\' 6"'$  (1.676 m)   Body mass index is 20.66 kg/m. Physical Exam Vitals reviewed.  Constitutional:      Appearance: Normal appearance.  HENT:     Head: Normocephalic.     Nose: Nose normal.     Mouth/Throat:     Mouth: Mucous membranes are moist.     Pharynx: Oropharynx is clear.  Eyes:     Pupils: Pupils are equal, round, and reactive to light.  Cardiovascular:     Rate and Rhythm: Normal rate. Rhythm irregular.      Pulses: Normal pulses.     Heart sounds: Normal heart sounds. No murmur heard. Pulmonary:     Effort: Pulmonary effort is normal.     Breath sounds: Normal breath sounds.  Abdominal:     General: Abdomen is flat. Bowel sounds are normal.     Palpations:  Abdomen is soft.  Musculoskeletal:        General: No swelling.     Cervical back: Neck supple.  Skin:    General: Skin is warm.  Neurological:     Mental Status: She is alert and oriented to person, place, and time.     Comments: Left Hemiparesis  Psychiatric:        Mood and Affect: Mood normal.        Thought Content: Thought content normal.     Labs reviewed: Recent Labs    02/02/22 0000 04/19/22 0000 05/08/22 0000  NA 135* 136* 142  K 4.1 4.3 4.4  CL 93* 95* 102  CO2 23* 29* 29*  BUN 25* 23* 24*  CREATININE 1.1 1.1 1.2*  CALCIUM 10.0 9.3 9.2   Recent Labs    08/16/21 0000 09/26/21 0000  AST 30 15  ALT 23 8  ALKPHOS 87 90  ALBUMIN 3.1* 3.6   Recent Labs    09/26/21 0000 01/13/22 0000 02/02/22 0000  WBC 6.7 6.2 7.4  HGB 11.5* 10.4* 10.8*  HCT 36 31* 32*  PLT 125* 143* 138*   Lab Results  Component Value Date   TSH 2.87 02/03/2022   Lab Results  Component Value Date   HGBA1C 5.7 (H) 08/19/2020   Lab Results  Component Value Date   CHOL 180 09/26/2021   HDL 44 09/26/2021   LDLCALC 122 09/26/2021   LDLDIRECT 81.3 08/19/2020   TRIG 70 09/26/2021   CHOLHDL 2 09/12/2010    Significant Diagnostic Results in last 30 days:  No results found.  Assessment/Plan 1. 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 Essential hypertension Doing well oN Cozaar,Norvasc and Toprol,HCTZ Repeat BMP to follow Potassium  3. Mixed hyperlipidemia Repeat Lipid panel  4. Permanent atrial fibrillation (HCC) Digoxin and Toprol Not on Anticoagulation due to prvios issues with Bleeding Only on ASpirin 5. Anemia due  to stage 3a chronic kidney disease (HCC) Repeat CBC  6. Chronic right-sided low back pain with right-sided sciatica On Tylenol  7. Slow transit constipation On Miralax  8. H/O compression fracture of spine     Family/ staff Communication:   Labs/tests ordered:  CBC,CMP Lipid panel

## 2022-08-01 DIAGNOSIS — D649 Anemia, unspecified: Secondary | ICD-10-CM | POA: Diagnosis not present

## 2022-08-01 DIAGNOSIS — E785 Hyperlipidemia, unspecified: Secondary | ICD-10-CM | POA: Diagnosis not present

## 2022-08-01 LAB — BASIC METABOLIC PANEL
BUN: 27 — AB (ref 4–21)
CO2: 25 — AB (ref 13–22)
Chloride: 97 — AB (ref 99–108)
Creatinine: 1.3 — AB (ref 0.5–1.1)
Glucose: 154
Potassium: 4.3 mEq/L (ref 3.5–5.1)
Sodium: 136 — AB (ref 137–147)

## 2022-08-01 LAB — CBC AND DIFFERENTIAL
HCT: 33 — AB (ref 36–46)
Hemoglobin: 10.9 — AB (ref 12.0–16.0)
Platelets: 175 10*3/uL (ref 150–400)
WBC: 7.3

## 2022-08-01 LAB — HEPATIC FUNCTION PANEL
ALT: 8 U/L (ref 7–35)
AST: 18 (ref 13–35)
Alkaline Phosphatase: 86 (ref 25–125)
Bilirubin, Total: 0.3

## 2022-08-01 LAB — COMPREHENSIVE METABOLIC PANEL
Albumin: 3.8 (ref 3.5–5.0)
Calcium: 9.3 (ref 8.7–10.7)
Globulin: 3.4
eGFR: 40

## 2022-08-01 LAB — LIPID PANEL
Cholesterol: 174 (ref 0–200)
HDL: 43 (ref 35–70)
LDL Cholesterol: 115
Triglycerides: 81 (ref 40–160)

## 2022-08-01 LAB — CBC: RBC: 3.8 — AB (ref 3.87–5.11)

## 2022-08-02 ENCOUNTER — Encounter: Payer: Self-pay | Admitting: Internal Medicine

## 2022-08-22 ENCOUNTER — Ambulatory Visit (INDEPENDENT_AMBULATORY_CARE_PROVIDER_SITE_OTHER): Payer: Medicare Other | Admitting: Podiatry

## 2022-08-22 DIAGNOSIS — B351 Tinea unguium: Secondary | ICD-10-CM

## 2022-08-22 DIAGNOSIS — M79675 Pain in left toe(s): Secondary | ICD-10-CM | POA: Diagnosis not present

## 2022-08-22 DIAGNOSIS — M79674 Pain in right toe(s): Secondary | ICD-10-CM

## 2022-08-22 DIAGNOSIS — L84 Corns and callosities: Secondary | ICD-10-CM

## 2022-08-28 NOTE — Progress Notes (Signed)
Subjective: 88 year old female presents the office today for concerns of nail fungus and also nails becoming thick and elongated she is not able to trim the herself.  She also is getting ingrown toenails on the big toe.  Mom denies any swelling redness or any drainage.  She also has a corn on her right fifth toe causing discomfort.  No open lesions.   Objective: AAO x3, NAD-daughter present DP/PT pulses palpable bilaterally, CRT less than 3 seconds The nails are hypertrophic, dystrophic with yellow-brown discoloration with the hallux toenails the most affected.  There is no edema, erythema or signs of infection.  The nails do cause tenderness 1-5 bilaterally.  Incurvation of the hallux toenails. There is a hyperkeratotic lesion on the right fifth toe which causes discomfort.  There is no underlying ulceration drainage or any obvious signs of infection.  Adductovarus present of the digit. No pain with calf compression, swelling, warmth, erythema  Assessment: Symptomatic onychomycosis, hyperkeratotic lesion  Plan: -All treatment options discussed with the patient including all alternatives, risks, complications.  -I sharply debrided the nails x10 without any complications or bleeding.  But they were able to sharply with the ingrown toenails with any complications with them. -Sharply debrided the hyperkeratotic lesion x1 without any complications or bleeding as a courtesy as this was minimal.  Offloading padding avoid shoes that put excess pressure.  -Continue daily foot inspection -Patient encouraged to call the office with any questions, concerns, change in symptoms.   Trula Slade DPM

## 2022-08-29 ENCOUNTER — Non-Acute Institutional Stay (SKILLED_NURSING_FACILITY): Payer: Medicare Other | Admitting: Orthopedic Surgery

## 2022-08-29 ENCOUNTER — Encounter: Payer: Self-pay | Admitting: Orthopedic Surgery

## 2022-08-29 DIAGNOSIS — I69354 Hemiplegia and hemiparesis following cerebral infarction affecting left non-dominant side: Secondary | ICD-10-CM

## 2022-08-29 DIAGNOSIS — I4821 Permanent atrial fibrillation: Secondary | ICD-10-CM

## 2022-08-29 DIAGNOSIS — I4430 Unspecified atrioventricular block: Secondary | ICD-10-CM

## 2022-08-29 DIAGNOSIS — D696 Thrombocytopenia, unspecified: Secondary | ICD-10-CM

## 2022-08-29 DIAGNOSIS — I10 Essential (primary) hypertension: Secondary | ICD-10-CM | POA: Diagnosis not present

## 2022-08-29 DIAGNOSIS — M5441 Lumbago with sciatica, right side: Secondary | ICD-10-CM

## 2022-08-29 DIAGNOSIS — D631 Anemia in chronic kidney disease: Secondary | ICD-10-CM | POA: Diagnosis not present

## 2022-08-29 DIAGNOSIS — G8929 Other chronic pain: Secondary | ICD-10-CM

## 2022-08-29 DIAGNOSIS — E782 Mixed hyperlipidemia: Secondary | ICD-10-CM

## 2022-08-29 DIAGNOSIS — N1831 Chronic kidney disease, stage 3a: Secondary | ICD-10-CM

## 2022-08-29 NOTE — Progress Notes (Signed)
Location:  [facility] Room Number: 118/A Place of Service:  SNF [phone]) Provider: Waunita Schooner NP   [name], MD  Patient Care Team: [name], MD as PCP - General (Internal Medicine) [name], MD as PCP - Electrophysiology (Cardiology) [name], MD as Consulting Physician (Dermatology) [name], MD as Consulting Physician (Cardiology) [name], MD as Consulting Physician (Dermatology)   Code Status: DNR  Extended Emergency Contact Information Primary Emergency Contact: [name] of [place] Phone: [phone] Mobile Phone: [phone] Relation: Daughter Secondary Emergency Contact: [name],[name]  [name] of [place] Phone: [phone] Relation: Son Goals of care: Advanced Directive information    08/29/2022   10:16 AM  Advanced Directives  Does Patient Have a Medical Advance Directive? Yes  Type of Advance Directive Living will;Out of facility DNR (pink MOST or yellow form)  Does patient want to make changes to medical advance directive? No - Patient declined  Pre-existing out of facility DNR order (yellow form or pink MOST form) Pink Most/Yellow Form available - Physician notified to receive inpatient order;Yellow form placed in chart (order not valid for inpatient use)     Chief Complaint  Patient presents with  . Medical Management of Chronic Issues    Routine    HPI:  Pt is a 88 y.o. female seen today for medical management of chronic diseases.    She currently resides on the skilled nursing unit at [facility]. Past medical history includes: atrial fibrillation, AV block, CAD, hypertension, GERD, stroke, left sided paralysis, constipation, and dysphagia.    Left sided weakness- CVA (right basal ganglia embolic stroke) 62/0355, ambulates with PWC, remains on asa (h/o GI bleed on coumadin and ruptured Bakers cyst on Eliquis),  not on statin due to intolerance, followed by PT 3x/week HTN- BUN/creat 27/1.3 08/01/2022, remains on atenolol, amlodipine, losartan and HCTZ, Hydralazine prn for SBP > 170 HLD- LDL 115 08/01/2022, not on statin due to intolerance Atrial fibrillation- HR controlled with Digoxin- (dig level 0.82 04/25/2022), TSH 2.87 02/02/2022, remains on asa for clot prevention AV block- s/p pacemaker, last check 07/28 Anemia/CKD 3a- BUN/creat 27/1.3 08/01/2022, hgb 10.5 08/01/2022 Thrombocytopenia- platelets 175 08/01/2022 Chronic back pain- no complaints today, remains on tylenol/voltaren gel prn, lidocaine patches daily  No recent falls or injuries.    Recent blood pressures:  10/23- 130/72  10/20- 153/83  10/17- 147/78  Recent weights:  10/01- 128.7 lbs  09/01- 128 lbs  08/01- 127.8 lbs       Past Medical History:  Diagnosis Date  . Actinic keratosis 05/25/2014  . Anxiety   . Atrial fibrillation ([place]) 09/21/2010  . Basal cell carcinoma 05/25/2014   Multiple removed by Dr. Danella Sensing in March 2015: right neck, left neck, scalp   . Bell's palsy 07/18/1979  . Cervicalgia 01/31/2012  . Closed fracture of lumbar vertebra without mention of spinal cord injury 07/17/2005  . Conjunctiva disorder 12/26/2010  . Coronary atherosclerosis of native coronary artery 07/17/1997  . Cramp of limb 08/16/2009  . Degeneration of lumbar or lumbosacral intervertebral disc 01/31/2012  . Disturbance of skin sensation 08/2009  . Dizziness and giddiness 11/08/2011   vertigo  . External hemorrhoids without mention of complication 9/74/1638  . External hemorrhoids without mention of complication 45/36/4680  . Ganglion of tendon sheath 08/16/2009  . Leg cramp 10/27/2013   Most frequently the right leg.   . Long term (current) use of  anticoagulants 09/2010  . Lumbago 07/2009  . Meralgia paresthetica 07/2007  . MI, old   . Myalgia and myositis, unspecified 01/17/2012  . Osteoporosis   . Other abnormal blood  chemistry 04/08/2012  . Other abnormal blood chemistry 2013   hyperglycemia  . Other disorder of muscle, ligament, and fascia 04/08/2012  . Other specified cardiac dysrhythmias(427.89) 06/27/2010  . Pacemaker 05/06/2020  . Pain in joint, ankle and foot 10/27/2013   Bilateral since 1998   . Pain in joint, shoulder region 08/12/2012  . Pain in joint, upper arm 12/26/2010  . Pain in limb 01/11/2011  . Pain in thoracic spine 01/31/2012  . 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 PVC's (premature ventricular contractions)   . Rash and other nonspecific skin eruption 08/02/2011  . Senile osteoporosis 07/17/1993  . Stroke ([place])   . Unspecified essential hypertension 07/17/1997  . Varicose veins of lower extremities 08/16/2009  . Varicose veins of lower extremities 08/16/2009   Past Surgical History:  Procedure Laterality Date  . COLONOSCOPY WITH PROPOFOL N/A 10/19/2015   Procedure: COLONOSCOPY WITH PROPOFOL;  Surgeon: Gatha Mayer, MD;  Location: [facility];  Service: Endoscopy;  Laterality: N/A;  . CORONARY ARTERY BYPASS GRAFT  1998   x2; LIMA to LAD; SVG to diagonal off bypass  . HEMORRHOID SURGERY  08/26/2012   Dr. Brantley Stage  . LOOP RECORDER INSERTION N/A 05/13/2018   Procedure: LOOP RECORDER INSERTION;  Surgeon: [name], MD;  Location: [facility];  Service: Cardiovascular;  Laterality: N/A;  . LOOP RECORDER REMOVAL N/A 05/26/2019   Procedure: LOOP RECORDER REMOVAL;  Surgeon: [name], MD;  Location: [facility];  Service: Cardiovascular;  Laterality: N/A;  . MASS EXCISION Left 11/23/2015   Procedure: LEFT WRIST EXCISION CYST;  Surgeon: Leanora Cover, MD;  Location: [facility];  Service: Orthopedics;  Laterality: Left;  . PACEMAKER IMPLANT N/A 05/26/2019   Procedure: PACEMAKER IMPLANT;  Surgeon: [name], MD;  Location: [facility];  Service: Cardiovascular;  Laterality: N/A;  . SKIN BIOPSY  01/29/14   (R) neck; (R) scalp, 2 (L) neck;  shave biopsy Superficial basal cell carcinoma Dr. Danella Sensing  . TONSILLECTOMY  1940's    Allergies  Allergen Reactions  . Iodinated Contrast Media Nausea Only and Other (See Comments)    Severe nausea and also passed out  . Iodine   . Lodine [Etodolac]   . Other     Seasonal Allergies.  . Bactrim Rash  . Relafen [Nabumetone] Rash  . Sulfanilamide Rash    Allergies as of 08/29/2022       Reactions   Iodinated Contrast Media Nausea Only, Other (See Comments)   Severe nausea and also passed out   Iodine    Lodine [etodolac]    Other    Seasonal Allergies.   Bactrim Rash   Relafen [nabumetone] Rash   Sulfanilamide Rash        Medication List        Accurate as of August 29, 2022 11:16 AM. If you have any questions, ask your nurse or doctor.          acetaminophen 325 MG tablet Commonly known as: TYLENOL Take 650 mg by mouth 3 (three) times daily as needed.   amLODipine 5 MG tablet Commonly known as: NORVASC Take 5 mg by mouth daily.   aspirin EC 81 MG tablet Take 1 tablet (81 mg total) by mouth daily. Swallow whole.  atenolol 50 MG tablet Commonly known as: TENORMIN Take 50 mg by mouth 2 (two) times daily. HOLD FOR SPB <90 or HR< 60   calcium-vitamin D 500-200 MG-UNIT tablet Commonly known as: OSCAL WITH D Take 1 tablet by mouth daily.   diclofenac Sodium 1 % Gel Commonly known as: VOLTAREN Apply 4 g topically 4 (four) times daily as needed. Right knee, left ankle   digoxin 0.125 MG tablet Commonly known as: LANOXIN Take 0.125 mg by mouth every other day. Hold for apical pulse < 60 BPM   docusate sodium 100 MG capsule Commonly known as: Colace Take 1 capsule (100 mg total) by mouth daily.   hydrALAZINE 10 MG tablet Commonly known as: APRESOLINE Take 10 mg by mouth daily as needed. Check resident's B/P first and only administer Hydralazine '10mg'$  BID PRN for SBP greater than 170. What changed: Another medication with the same name was removed.  Continue taking this medication, and follow the directions you see here. Changed by: Yvonna Alanis, NP   hydrochlorothiazide 50 MG tablet Commonly known as: HYDRODIURIL Take 25 mg by mouth daily. Special Instructions: for hypertension HCTZ changed to 25 mg PO  Once a morning 8:00-11:00AM   levocetirizine 5 MG tablet Commonly known as: XYZAL Take 5 mg by mouth at bedtime.   lidocaine 5 % Commonly known as: LIDODERM Place 1 patch onto the skin as needed. Remove & Discard patch within 12 hours or as directed by MD   LORazepam 1 MG tablet Commonly known as: ATIVAN Take 1 tablet (1 mg total) by mouth at bedtime.   losartan 25 MG tablet Commonly known as: COZAAR Take 25 mg by mouth daily.   ondansetron 4 MG tablet Commonly known as: ZOFRAN Take 1 tablet (4 mg total) by mouth every 6 (six) hours as needed for nausea.   polyethylene glycol 17 g packet Commonly known as: MIRALAX / GLYCOLAX Take 17 g by mouth daily. Mix in 4-6 oz of beverage of choice PO QD.  Once A Morning 8:00-11:00AM   potassium chloride SA 20 MEQ tablet Commonly known as: KLOR-CON M Take 10 mEq by mouth daily.        Review of Systems  Constitutional:  Negative for activity change, appetite change, fatigue and fever.  HENT:  Negative for congestion, hearing loss and trouble swallowing.   Eyes:  Negative for visual disturbance.  Respiratory:  Negative for cough, shortness of breath and wheezing.   Cardiovascular:  Negative for chest pain and leg swelling.  Gastrointestinal:  Positive for constipation. Negative for abdominal distention, abdominal pain, diarrhea, nausea and vomiting.  Genitourinary:  Negative for dysuria and frequency.  Musculoskeletal:  Positive for arthralgias, back pain and gait problem.  Skin:  Negative for wound.  Neurological:  Positive for weakness. Negative for dizziness and headaches.  Psychiatric/Behavioral:  Negative for confusion, dysphoric mood and sleep disturbance. The patient  is not nervous/anxious.     Immunization History  Administered Date(s) Administered  . Influenza Whole 08/06/2012  . Influenza, High Dose Seasonal PF 08/15/2019, 08/27/2020  . Influenza,inj,Quad PF,6+ Mos 08/30/2018  . Influenza-Unspecified 08/06/2013, 08/24/2014, 08/26/2015, 08/31/2016, 08/27/2017, 08/27/2020  . Moderna Sars-Covid-2 Vaccination 11/16/2019, 12/16/2019, 09/16/2020  . Pneumococcal Conjugate-13 09/22/2015  . Pneumococcal Polysaccharide-23 08/29/2006  . Td 10/07/2007  . Zoster Recombinat (Shingrix) 02/12/2018, 05/13/2018  . Zoster, Live 03/12/2008   Pertinent  Health Maintenance Due  Topic Date Due  . INFLUENZA VACCINE  06/06/2022  . DEXA SCAN  Completed  08/23/2020    9:56 AM 08/23/2020   10:33 PM 08/26/2020    6:15 AM 09/23/2020   11:32 AM 09/27/2021   10:28 AM  Fall Risk  Falls in the past year?   1 1 0  Was there an injury with Fall?   1 1 0  Was there an injury with Fall? - Comments    Patient fractured pelvis and had a stroke   Fall Risk Category Calculator   2 2 0  Fall Risk Category   Moderate Moderate Low  Patient Fall Risk Level High fall risk High fall risk  Moderate fall risk Low fall risk  Patient at Risk for Falls Due to   History of fall(s);Impaired balance/gait;Impaired mobility;Orthopedic patient  No Fall Risks  Fall risk Follow up   Falls evaluation completed;Education provided;Falls prevention discussed;Follow up appointment  Falls evaluation completed   Functional Status Survey:    Vitals:   08/29/22 1006  BP: 130/72  Pulse: 60  Resp: 18  Temp: (!) 97 F (36.1 C)  SpO2: 98%  Weight: 128 lb 11.2 oz (58.4 kg)  Height: '5\' 6"'$  (1.676 m)   Body mass index is 20.77 kg/m. Physical Exam Vitals reviewed.  Constitutional:      General: She is not in acute distress. HENT:     Head: Normocephalic.     Right Ear: There is no impacted cerumen.     Left Ear: There is no impacted cerumen.     Nose: Nose normal.     Mouth/Throat:      Mouth: Mucous membranes are moist.  Eyes:     General:        Right eye: No discharge.        Left eye: No discharge.  Cardiovascular:     Rate and Rhythm: Normal rate. Rhythm irregular.     Pulses: Normal pulses.     Heart sounds: Normal heart sounds.  Pulmonary:     Effort: Pulmonary effort is normal. No respiratory distress.     Breath sounds: Normal breath sounds. No wheezing.  Abdominal:     General: Bowel sounds are normal. There is no distension.     Palpations: Abdomen is soft.     Tenderness: There is no abdominal tenderness.  Musculoskeletal:     Cervical back: Neck supple.     Right lower leg: No edema.     Left lower leg: No edema.  Skin:    General: Skin is warm and dry.     Capillary Refill: Capillary refill takes less than 2 seconds.  Neurological:     General: No focal deficit present.     Mental Status: She is alert and oriented to person, place, and time.     Motor: Weakness present.     Gait: Gait abnormal.     Comments: Left sided weakness, PWC  Psychiatric:        Mood and Affect: Mood normal.        Behavior: Behavior normal.    Labs reviewed: Recent Labs    04/19/22 0000 05/08/22 0000 08/01/22 0000  NA 136* 142 136*  K 4.3 4.4 4.3  CL 95* 102 97*  CO2 29* 29* 25*  BUN 23* 24* 27*  CREATININE 1.1 1.2* 1.3*  CALCIUM 9.3 9.2 9.3   Recent Labs    09/26/21 0000 08/01/22 0000  AST 15 18  ALT 8 8  ALKPHOS 90 86  ALBUMIN 3.6 3.8   Recent Labs    01/13/22  0000 02/02/22 0000 08/01/22 0000  WBC 6.2 7.4 7.3  HGB 10.4* 10.8* 10.9*  HCT 31* 32* 33*  PLT 143* 138* 175   Lab Results  Component Value Date   TSH 2.87 02/03/2022   Lab Results  Component Value Date   HGBA1C 5.7 (H) 08/19/2020   Lab Results  Component Value Date   CHOL 174 08/01/2022   HDL 43 08/01/2022   LDLCALC 115 08/01/2022   LDLDIRECT 81.3 08/19/2020   TRIG 81 08/01/2022   CHOLHDL 2 09/12/2010    Significant Diagnostic Results in last 30 days:  No results  found.  Assessment/Plan 1. 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 Essential hypertension - controlled - cont atenolol, amlodipine, losartan and HCTZ, - cont hydralazine for SBP> 170  3. Mixed hyperlipidemia - LDL improved 115 08/01/2022, was 122 - h/o statin intolerance  4. Permanent atrial fibrillation ([facility]) - HR controlled with Digoxin - off anticoagulants due to GI bleed - cont asa  5. AV block - s/p pacemaker  6. Anemia due to stage 3a chronic kidney disease ([facility]) - hgb stable - avoid nephrotoxic drugs like NSAIDS and dose adjust medications to be renally excreted - encourage hydration with water   7. Thrombocytopenia ([facility]) - improved  8. Chronic right-sided low back pain with right-sided sciatica - stable  - cont tylenol, voltaren gel and lidocaine patches    Family/ staff Communication: plan discussed with patient and nurse  Labs/tests ordered: none

## 2022-09-01 ENCOUNTER — Ambulatory Visit (INDEPENDENT_AMBULATORY_CARE_PROVIDER_SITE_OTHER): Payer: Medicare Other

## 2022-09-01 DIAGNOSIS — I442 Atrioventricular block, complete: Secondary | ICD-10-CM | POA: Diagnosis not present

## 2022-09-01 LAB — CUP PACEART REMOTE DEVICE CHECK
Battery Remaining Longevity: 110 mo
Battery Remaining Percentage: 80 %
Battery Voltage: 3.02 V
Brady Statistic RV Percent Paced: 11 %
Date Time Interrogation Session: 20231027020015
Implantable Lead Connection Status: 753985
Implantable Lead Implant Date: 20200720
Implantable Lead Location: 753860
Implantable Pulse Generator Implant Date: 20200720
Lead Channel Impedance Value: 530 Ohm
Lead Channel Pacing Threshold Amplitude: 1 V
Lead Channel Pacing Threshold Pulse Width: 0.5 ms
Lead Channel Sensing Intrinsic Amplitude: 5.2 mV
Lead Channel Setting Pacing Amplitude: 2.5 V
Lead Channel Setting Pacing Pulse Width: 0.5 ms
Lead Channel Setting Sensing Sensitivity: 2 mV
Pulse Gen Model: 1272
Pulse Gen Serial Number: 9122450

## 2022-09-04 ENCOUNTER — Non-Acute Institutional Stay (SKILLED_NURSING_FACILITY): Payer: Medicare Other | Admitting: Internal Medicine

## 2022-09-04 DIAGNOSIS — H01006 Unspecified blepharitis left eye, unspecified eyelid: Secondary | ICD-10-CM

## 2022-09-04 DIAGNOSIS — H00014 Hordeolum externum left upper eyelid: Secondary | ICD-10-CM | POA: Diagnosis not present

## 2022-09-04 NOTE — Progress Notes (Signed)
Location: [facility] of Service:  SNF (31)  Provider:   Code Status: DNR Goals of Care:     08/29/2022   10:16 AM  Advanced Directives  Does Patient Have a Medical Advance Directive? Yes  Type of Advance Directive Living will;Out of facility DNR (pink MOST or yellow form)  Does patient want to make changes to medical advance directive? No - Patient declined  Pre-existing out of facility DNR order (yellow form or pink MOST form) Pink Most/Yellow Form available - Physician notified to receive inpatient order;Yellow form placed in chart (order not valid for inpatient use)     Chief Complaint  Patient presents with   Acute Visit    HPI: Patient is a 88 y.o. 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 Danella Sensing in March 2015: right neck, left neck, scalp    Bell's palsy 07/18/1979   Cervicalgia 01/31/2012   Closed fracture of lumbar vertebra without mention of spinal cord injury 07/17/2005   Conjunctiva disorder 12/26/2010   Coronary atherosclerosis of native coronary artery 07/17/1997   Cramp of limb 08/16/2009   Degeneration of lumbar or lumbosacral intervertebral disc 01/31/2012   Disturbance of skin sensation 08/2009   Dizziness and giddiness 11/08/2011   vertigo   External hemorrhoids  without mention of complication 0/62/6948   External hemorrhoids without mention of complication 54/62/7035   Ganglion of tendon sheath 08/16/2009   Leg cramp 10/27/2013   Most frequently the right leg.    Long term (current) use of anticoagulants 09/2010   Lumbago 07/2009   Meralgia paresthetica 07/2007   MI, old    Myalgia and myositis, unspecified 01/17/2012   Osteoporosis    Other abnormal blood chemistry 04/08/2012   Other abnormal blood chemistry 2013   hyperglycemia   Other disorder of muscle, ligament, and fascia 04/08/2012   Other specified cardiac dysrhythmias(427.89) 06/27/2010   Pacemaker 05/06/2020   Pain in joint, ankle and foot 10/27/2013   Bilateral since 1998    Pain in joint, shoulder region 08/12/2012   Pain in joint, upper arm 12/26/2010   Pain in limb 01/11/2011   Pain in thoracic spine 01/31/2012   Pathologic fracture of vertebrae 01/22/2012   PVC's (premature ventricular contractions)    Rash and other nonspecific skin eruption 08/02/2011   Senile osteoporosis 07/17/1993   Stroke ([place])    Unspecified essential hypertension 07/17/1997   Varicose veins of lower extremities 08/16/2009   Varicose veins of lower extremities 08/16/2009    Past Surgical History:  Procedure Laterality Date   COLONOSCOPY WITH PROPOFOL N/A 10/19/2015   Procedure: COLONOSCOPY WITH PROPOFOL;  Surgeon: Gatha Mayer, MD;  Location: [facility];  Service: Endoscopy;  Laterality: N/A;   [place]  x2; LIMA to LAD; SVG to diagonal off bypass   HEMORRHOID SURGERY  08/26/2012   Dr. Brantley Stage   LOOP RECORDER INSERTION N/A 05/13/2018   Procedure: LOOP RECORDER INSERTION;  Surgeon: [name], MD;  Location: [facility];  Service: Cardiovascular;  Laterality: N/A;   LOOP RECORDER REMOVAL N/A 05/26/2019   Procedure: LOOP RECORDER REMOVAL;  Surgeon: [name], MD;  Location: [facility];  Service: Cardiovascular;  Laterality: N/A;   MASS EXCISION Left  11/23/2015   Procedure: LEFT WRIST EXCISION CYST;  Surgeon: Leanora Cover, MD;  Location: [facility];  Service: Orthopedics;  Laterality: Left;   PACEMAKER IMPLANT N/A 05/26/2019   Procedure: PACEMAKER IMPLANT;  Surgeon: [name], MD;  Location: [facility];  Service: Cardiovascular;  Laterality: N/A;   SKIN BIOPSY  01/29/14   (R) neck; (R) scalp, 2 (L) neck; shave biopsy Superficial basal cell carcinoma Dr. Danella Sensing   TONSILLECTOMY  [phone]    Allergies  Allergen Reactions   Iodinated Contrast Media Nausea Only and Other (See Comments)    Severe nausea and also passed out   Iodine    Lodine [Etodolac]    Other     Seasonal Allergies.   Bactrim Rash   Relafen [Nabumetone] Rash   Sulfanilamide Rash    Outpatient Encounter Medications as of 09/04/2022  Medication Sig   acetaminophen (TYLENOL) 325 MG tablet Take 650 mg by mouth 3 (three) times daily as needed.   amLODipine (NORVASC) 5 MG tablet Take 5 mg by mouth daily.   aspirin EC 81 MG EC tablet Take 1 tablet (81 mg total) by mouth daily. Swallow whole.   atenolol (TENORMIN) 50 MG tablet Take 50 mg by mouth 2 (two) times daily. HOLD FOR SPB <90 or HR< 60   calcium-vitamin D (OSCAL WITH D) 500-200 MG-UNIT tablet Take 1 tablet by mouth daily.   diclofenac Sodium (VOLTAREN) 1 % GEL Apply 4 g topically 4 (four) times daily as needed. Right knee, left ankle   digoxin (LANOXIN) 0.125 MG tablet Take 0.125 mg by mouth every other day. Hold for apical pulse < 60 BPM   docusate sodium (COLACE) 100 MG capsule Take 1 capsule (100 mg total) by mouth daily.   hydrALAZINE (APRESOLINE) 10 MG tablet Take 10 mg by mouth daily as needed. Check resident's B/P first and only administer Hydralazine '10mg'$  BID PRN for SBP greater than 170.   hydrochlorothiazide (HYDRODIURIL) 50 MG tablet Take 25 mg by mouth daily. Special Instructions: for hypertension HCTZ changed to 25 mg PO  Once a morning 8:00-11:00AM   levocetirizine (XYZAL)  5 MG tablet Take 5 mg by mouth at bedtime.   lidocaine (LIDODERM) 5 % Place 1 patch onto the skin as needed. Remove & Discard patch within 12 hours or as directed by MD   LORazepam (ATIVAN) 1 MG tablet Take 1 tablet (1 mg total) by mouth at bedtime.   losartan (COZAAR) 25 MG tablet Take 25 mg by mouth daily.   ondansetron (ZOFRAN) 4 MG tablet Take 1 tablet (4 mg total) by mouth every 6 (six) hours as needed for nausea.   polyethylene glycol (MIRALAX / GLYCOLAX) 17 g packet Take 17 g by mouth daily. Mix in 4-6 oz of beverage of choice PO QD.  Once A Morning 8:00-11:00AM   potassium chloride SA (KLOR-CON) 20 MEQ tablet Take 10 mEq by mouth daily.   No facility-administered encounter medications on file as of 09/04/2022.  Review of Systems:  Review of Systems  Constitutional:  Negative for activity change and appetite change.  HENT: Negative.    Eyes:  Positive for discharge and itching. Negative for redness and visual disturbance.  Respiratory:  Negative for cough and shortness of breath.   Cardiovascular:  Negative for leg swelling.  Gastrointestinal:  Negative for constipation.  Genitourinary: Negative.   Musculoskeletal:  Positive for gait problem. Negative for arthralgias and myalgias.  Skin: Negative.   Neurological:  Negative for dizziness and weakness.  Psychiatric/Behavioral:  Negative for confusion, dysphoric mood and sleep disturbance.     Health Maintenance  Topic Date Due   TETANUS/TDAP  10/06/2017   INFLUENZA VACCINE  06/06/2022   Medicare Annual Wellness (AWV)  09/27/2022   COVID-19 Vaccine (4 - Moderna risk series) 11/06/2028 (Originally 11/11/2020)   Pneumonia Vaccine 64+ Years old  Completed   DEXA SCAN  Completed   Zoster Vaccines- Shingrix  Completed   HPV VACCINES  Aged Out    Physical Exam: There were no vitals filed for this visit. There is no height or weight on file to calculate BMI. Physical Exam Vitals reviewed.  Constitutional:      Appearance:  Normal appearance.  HENT:     Head: Normocephalic.     Nose: Nose normal.     Mouth/Throat:     Mouth: Mucous membranes are moist.     Pharynx: Oropharynx is clear.  Eyes:     General:        Left eye: Discharge present.    Pupils: Pupils are equal, round, and reactive to light.     Comments: Upper Eyelid Stye in upper lid medial Angle with some blepharitis Eyes are not red  Cardiovascular:     Rate and Rhythm: Normal rate and regular rhythm.     Pulses: Normal pulses.     Heart sounds: Normal heart sounds. No murmur heard. Pulmonary:     Effort: Pulmonary effort is normal.     Breath sounds: Normal breath sounds.  Abdominal:     General: Abdomen is flat. Bowel sounds are normal.     Palpations: Abdomen is soft.  Musculoskeletal:        General: No swelling.     Cervical back: Neck supple.  Skin:    General: Skin is warm.  Neurological:     Mental Status: She is alert and oriented to person, place, and time.  Psychiatric:        Mood and Affect: Mood normal.        Thought Content: Thought content normal.    Labs reviewed: Basic Metabolic Panel: Recent Labs    02/03/22 0000 04/19/22 0000 05/08/22 0000 08/01/22 0000  NA  --  136* 142 136*  K  --  4.3 4.4 4.3  CL  --  95* 102 97*  CO2  --  29* 29* 25*  BUN  --  23* 24* 27*  CREATININE  --  1.1 1.2* 1.3*  CALCIUM  --  9.3 9.2 9.3  TSH 2.87  --   --   --    Liver Function Tests: Recent Labs    09/26/21 0000 08/01/22 0000  AST 15 18  ALT 8 8  ALKPHOS 90 86  ALBUMIN 3.6 3.8   No results for input(s): "LIPASE", "AMYLASE" in the last 8760 hours. No results for input(s): "AMMONIA" in the last 8760 hours. CBC: Recent Labs    01/13/22 0000 02/02/22 0000 08/01/22 0000  WBC 6.2 7.4  7.3  HGB 10.4* 10.8* 10.9*  HCT 31* 32* 33*  PLT 143* 138* 175   Lipid Panel: Recent Labs    09/26/21 0000 08/01/22 0000  CHOL 180 174  HDL 44 43  LDLCALC 122 115  TRIG 70 81   Lab Results  Component Value Date    HGBA1C 5.7 (H) 08/19/2020    Procedures since last visit: CUP PACEART REMOTE DEVICE CHECK  Result Date: 09/01/2022 Scheduled remote reviewed. Normal device function.  Next remote 91 days. LA   Assessment/Plan 1. Hordeolum externum of left upper eyelid Warm Compresses BID for 2 weeks Then QD for 2 weeks Then PRN No Make up   2. Angular blepharitis of left eye Erythromycin Ointment TID for 2 weeks    Labs/tests ordered:  * No order type specified * Next appt:  Visit date not found

## 2022-09-07 NOTE — Progress Notes (Signed)
Remote pacemaker transmission.   

## 2022-09-11 DIAGNOSIS — M5459 Other low back pain: Secondary | ICD-10-CM | POA: Diagnosis not present

## 2022-09-11 DIAGNOSIS — G8104 Flaccid hemiplegia affecting left nondominant side: Secondary | ICD-10-CM | POA: Diagnosis not present

## 2022-09-11 DIAGNOSIS — M6389 Disorders of muscle in diseases classified elsewhere, multiple sites: Secondary | ICD-10-CM | POA: Diagnosis not present

## 2022-09-11 DIAGNOSIS — I6389 Other cerebral infarction: Secondary | ICD-10-CM | POA: Diagnosis not present

## 2022-09-11 DIAGNOSIS — R2689 Other abnormalities of gait and mobility: Secondary | ICD-10-CM | POA: Diagnosis not present

## 2022-09-12 ENCOUNTER — Ambulatory Visit: Payer: Medicare Other | Admitting: Podiatry

## 2022-09-14 ENCOUNTER — Other Ambulatory Visit: Payer: Self-pay | Admitting: Adult Health

## 2022-09-14 DIAGNOSIS — F419 Anxiety disorder, unspecified: Secondary | ICD-10-CM

## 2022-09-14 MED ORDER — LORAZEPAM 1 MG PO TABS: 1.0000 mg | ORAL_TABLET | Freq: Every day | ORAL | 5 refills | Status: DC

## 2022-09-15 DIAGNOSIS — G8104 Flaccid hemiplegia affecting left nondominant side: Secondary | ICD-10-CM | POA: Diagnosis not present

## 2022-09-15 DIAGNOSIS — M5459 Other low back pain: Secondary | ICD-10-CM | POA: Diagnosis not present

## 2022-09-15 DIAGNOSIS — I6389 Other cerebral infarction: Secondary | ICD-10-CM | POA: Diagnosis not present

## 2022-09-15 DIAGNOSIS — R2689 Other abnormalities of gait and mobility: Secondary | ICD-10-CM | POA: Diagnosis not present

## 2022-09-15 DIAGNOSIS — M6389 Disorders of muscle in diseases classified elsewhere, multiple sites: Secondary | ICD-10-CM | POA: Diagnosis not present

## 2022-09-18 DIAGNOSIS — G8104 Flaccid hemiplegia affecting left nondominant side: Secondary | ICD-10-CM | POA: Diagnosis not present

## 2022-09-18 DIAGNOSIS — R2689 Other abnormalities of gait and mobility: Secondary | ICD-10-CM | POA: Diagnosis not present

## 2022-09-18 DIAGNOSIS — M6389 Disorders of muscle in diseases classified elsewhere, multiple sites: Secondary | ICD-10-CM | POA: Diagnosis not present

## 2022-09-18 DIAGNOSIS — I6389 Other cerebral infarction: Secondary | ICD-10-CM | POA: Diagnosis not present

## 2022-09-18 DIAGNOSIS — M5459 Other low back pain: Secondary | ICD-10-CM | POA: Diagnosis not present

## 2022-09-22 DIAGNOSIS — M6389 Disorders of muscle in diseases classified elsewhere, multiple sites: Secondary | ICD-10-CM | POA: Diagnosis not present

## 2022-09-22 DIAGNOSIS — M5459 Other low back pain: Secondary | ICD-10-CM | POA: Diagnosis not present

## 2022-09-22 DIAGNOSIS — G8104 Flaccid hemiplegia affecting left nondominant side: Secondary | ICD-10-CM | POA: Diagnosis not present

## 2022-09-22 DIAGNOSIS — I6389 Other cerebral infarction: Secondary | ICD-10-CM | POA: Diagnosis not present

## 2022-09-22 DIAGNOSIS — R2689 Other abnormalities of gait and mobility: Secondary | ICD-10-CM | POA: Diagnosis not present

## 2022-09-25 DIAGNOSIS — M5459 Other low back pain: Secondary | ICD-10-CM | POA: Diagnosis not present

## 2022-09-25 DIAGNOSIS — R2689 Other abnormalities of gait and mobility: Secondary | ICD-10-CM | POA: Diagnosis not present

## 2022-09-25 DIAGNOSIS — I6389 Other cerebral infarction: Secondary | ICD-10-CM | POA: Diagnosis not present

## 2022-09-25 DIAGNOSIS — G8104 Flaccid hemiplegia affecting left nondominant side: Secondary | ICD-10-CM | POA: Diagnosis not present

## 2022-09-25 DIAGNOSIS — M6389 Disorders of muscle in diseases classified elsewhere, multiple sites: Secondary | ICD-10-CM | POA: Diagnosis not present

## 2022-09-26 ENCOUNTER — Emergency Department (HOSPITAL_COMMUNITY): Payer: Medicare Other

## 2022-09-26 ENCOUNTER — Encounter: Payer: Self-pay | Admitting: Orthopedic Surgery

## 2022-09-26 ENCOUNTER — Other Ambulatory Visit: Payer: Self-pay

## 2022-09-26 ENCOUNTER — Non-Acute Institutional Stay (SKILLED_NURSING_FACILITY): Payer: Medicare Other | Admitting: Orthopedic Surgery

## 2022-09-26 ENCOUNTER — Inpatient Hospital Stay (HOSPITAL_COMMUNITY)
Admission: EM | Admit: 2022-09-26 | Discharge: 2022-10-02 | DRG: 062 | Disposition: A | Discharge status: Skilled Nursing Facility | Payer: Medicare Other | Source: Skilled Nursing Facility | Attending: Neurology | Admitting: Neurology

## 2022-09-26 DIAGNOSIS — I252 Old myocardial infarction: Secondary | ICD-10-CM

## 2022-09-26 DIAGNOSIS — R29724 NIHSS score 24: Secondary | ICD-10-CM | POA: Diagnosis present

## 2022-09-26 DIAGNOSIS — R29720 NIHSS score 20: Secondary | ICD-10-CM | POA: Diagnosis not present

## 2022-09-26 DIAGNOSIS — Z950 Presence of cardiac pacemaker: Secondary | ICD-10-CM

## 2022-09-26 DIAGNOSIS — R29717 NIHSS score 17: Secondary | ICD-10-CM | POA: Diagnosis not present

## 2022-09-26 DIAGNOSIS — I4891 Unspecified atrial fibrillation: Secondary | ICD-10-CM | POA: Diagnosis present

## 2022-09-26 DIAGNOSIS — I69354 Hemiplegia and hemiparesis following cerebral infarction affecting left non-dominant side: Secondary | ICD-10-CM

## 2022-09-26 DIAGNOSIS — I119 Hypertensive heart disease without heart failure: Secondary | ICD-10-CM | POA: Diagnosis present

## 2022-09-26 DIAGNOSIS — R29718 NIHSS score 18: Secondary | ICD-10-CM | POA: Diagnosis not present

## 2022-09-26 DIAGNOSIS — I63531 Cerebral infarction due to unspecified occlusion or stenosis of right posterior cerebral artery: Principal | ICD-10-CM | POA: Diagnosis present

## 2022-09-26 DIAGNOSIS — E785 Hyperlipidemia, unspecified: Secondary | ICD-10-CM | POA: Diagnosis present

## 2022-09-26 DIAGNOSIS — R29716 NIHSS score 16: Secondary | ICD-10-CM | POA: Diagnosis not present

## 2022-09-26 DIAGNOSIS — I6389 Other cerebral infarction: Secondary | ICD-10-CM | POA: Diagnosis not present

## 2022-09-26 DIAGNOSIS — I491 Atrial premature depolarization: Secondary | ICD-10-CM | POA: Diagnosis not present

## 2022-09-26 DIAGNOSIS — R29810 Facial weakness: Secondary | ICD-10-CM | POA: Diagnosis not present

## 2022-09-26 DIAGNOSIS — R1310 Dysphagia, unspecified: Secondary | ICD-10-CM | POA: Diagnosis present

## 2022-09-26 DIAGNOSIS — R4781 Slurred speech: Secondary | ICD-10-CM | POA: Diagnosis not present

## 2022-09-26 DIAGNOSIS — Z85828 Personal history of other malignant neoplasm of skin: Secondary | ICD-10-CM

## 2022-09-26 DIAGNOSIS — I6523 Occlusion and stenosis of bilateral carotid arteries: Secondary | ICD-10-CM | POA: Diagnosis present

## 2022-09-26 DIAGNOSIS — R29818 Other symptoms and signs involving the nervous system: Secondary | ICD-10-CM | POA: Diagnosis not present

## 2022-09-26 DIAGNOSIS — Z66 Do not resuscitate: Secondary | ICD-10-CM | POA: Diagnosis present

## 2022-09-26 DIAGNOSIS — I639 Cerebral infarction, unspecified: Secondary | ICD-10-CM | POA: Diagnosis not present

## 2022-09-26 DIAGNOSIS — H518 Other specified disorders of binocular movement: Secondary | ICD-10-CM | POA: Diagnosis present

## 2022-09-26 DIAGNOSIS — Z91041 Radiographic dye allergy status: Secondary | ICD-10-CM

## 2022-09-26 DIAGNOSIS — Z8719 Personal history of other diseases of the digestive system: Secondary | ICD-10-CM

## 2022-09-26 DIAGNOSIS — R404 Transient alteration of awareness: Secondary | ICD-10-CM | POA: Diagnosis not present

## 2022-09-26 DIAGNOSIS — Z79899 Other long term (current) drug therapy: Secondary | ICD-10-CM

## 2022-09-26 DIAGNOSIS — R29719 NIHSS score 19: Secondary | ICD-10-CM | POA: Diagnosis not present

## 2022-09-26 DIAGNOSIS — Z8249 Family history of ischemic heart disease and other diseases of the circulatory system: Secondary | ICD-10-CM

## 2022-09-26 DIAGNOSIS — M810 Age-related osteoporosis without current pathological fracture: Secondary | ICD-10-CM | POA: Diagnosis present

## 2022-09-26 DIAGNOSIS — Z888 Allergy status to other drugs, medicaments and biological substances status: Secondary | ICD-10-CM

## 2022-09-26 DIAGNOSIS — I700 Atherosclerosis of aorta: Secondary | ICD-10-CM | POA: Diagnosis not present

## 2022-09-26 DIAGNOSIS — I2510 Atherosclerotic heart disease of native coronary artery without angina pectoris: Secondary | ICD-10-CM | POA: Diagnosis not present

## 2022-09-26 DIAGNOSIS — I63511 Cerebral infarction due to unspecified occlusion or stenosis of right middle cerebral artery: Secondary | ICD-10-CM | POA: Diagnosis not present

## 2022-09-26 DIAGNOSIS — R471 Dysarthria and anarthria: Secondary | ICD-10-CM | POA: Diagnosis present

## 2022-09-26 DIAGNOSIS — I480 Paroxysmal atrial fibrillation: Secondary | ICD-10-CM | POA: Diagnosis not present

## 2022-09-26 DIAGNOSIS — R509 Fever, unspecified: Secondary | ICD-10-CM | POA: Diagnosis not present

## 2022-09-26 DIAGNOSIS — Z7401 Bed confinement status: Secondary | ICD-10-CM | POA: Diagnosis not present

## 2022-09-26 DIAGNOSIS — D649 Anemia, unspecified: Secondary | ICD-10-CM | POA: Diagnosis present

## 2022-09-26 DIAGNOSIS — Z882 Allergy status to sulfonamides status: Secondary | ICD-10-CM

## 2022-09-26 DIAGNOSIS — F419 Anxiety disorder, unspecified: Secondary | ICD-10-CM | POA: Diagnosis present

## 2022-09-26 DIAGNOSIS — Z7982 Long term (current) use of aspirin: Secondary | ICD-10-CM

## 2022-09-26 DIAGNOSIS — R531 Weakness: Secondary | ICD-10-CM | POA: Diagnosis not present

## 2022-09-26 DIAGNOSIS — N179 Acute kidney failure, unspecified: Secondary | ICD-10-CM | POA: Diagnosis present

## 2022-09-26 DIAGNOSIS — R0689 Other abnormalities of breathing: Secondary | ICD-10-CM | POA: Diagnosis not present

## 2022-09-26 DIAGNOSIS — Z886 Allergy status to analgesic agent status: Secondary | ICD-10-CM

## 2022-09-26 DIAGNOSIS — D72829 Elevated white blood cell count, unspecified: Secondary | ICD-10-CM | POA: Diagnosis present

## 2022-09-26 DIAGNOSIS — I517 Cardiomegaly: Secondary | ICD-10-CM | POA: Diagnosis not present

## 2022-09-26 DIAGNOSIS — Z8673 Personal history of transient ischemic attack (TIA), and cerebral infarction without residual deficits: Secondary | ICD-10-CM | POA: Diagnosis not present

## 2022-09-26 DIAGNOSIS — R4182 Altered mental status, unspecified: Secondary | ICD-10-CM | POA: Diagnosis not present

## 2022-09-26 DIAGNOSIS — L570 Actinic keratosis: Secondary | ICD-10-CM | POA: Diagnosis present

## 2022-09-26 DIAGNOSIS — I10 Essential (primary) hypertension: Secondary | ICD-10-CM | POA: Diagnosis present

## 2022-09-26 LAB — COMPREHENSIVE METABOLIC PANEL
ALT: 12 U/L (ref 0–44)
AST: 17 U/L (ref 15–41)
Albumin: 3.1 g/dL — ABNORMAL LOW (ref 3.5–5.0)
Alkaline Phosphatase: 71 U/L (ref 38–126)
Anion gap: 12 (ref 5–15)
BUN: 33 mg/dL — ABNORMAL HIGH (ref 8–23)
CO2: 29 mmol/L (ref 22–32)
Calcium: 9.2 mg/dL (ref 8.9–10.3)
Chloride: 94 mmol/L — ABNORMAL LOW (ref 98–111)
Creatinine, Ser: 1.4 mg/dL — ABNORMAL HIGH (ref 0.44–1.00)
GFR, Estimated: 36 mL/min — ABNORMAL LOW (ref >60)
Glucose, Bld: 79 mg/dL (ref 70–99)
Potassium: 4.2 mmol/L (ref 3.5–5.1)
Sodium: 135 mmol/L (ref 135–145)
Total Bilirubin: 0.3 mg/dL (ref 0.3–1.2)
Total Protein: 7.1 g/dL (ref 6.5–8.1)

## 2022-09-26 LAB — I-STAT CHEM 8, ED
BUN: 34 mg/dL — ABNORMAL HIGH (ref 8–23)
Calcium, Ion: 0.94 mmol/L — ABNORMAL LOW (ref 1.15–1.40)
Chloride: 104 mmol/L (ref 98–111)
Creatinine, Ser: 1.4 mg/dL — ABNORMAL HIGH (ref 0.44–1.00)
Glucose, Bld: 89 mg/dL (ref 70–99)
HCT: 33 % — ABNORMAL LOW (ref 36.0–46.0)
Hemoglobin: 11.2 g/dL — ABNORMAL LOW (ref 12.0–15.0)
Potassium: 4.2 mmol/L (ref 3.5–5.1)
Sodium: 134 mmol/L — ABNORMAL LOW (ref 135–145)
TCO2: 26 mmol/L (ref 22–32)

## 2022-09-26 LAB — PROTIME-INR
INR: 1.2 (ref 0.8–1.2)
Prothrombin Time: 15.5 seconds — ABNORMAL HIGH (ref 11.4–15.2)

## 2022-09-26 LAB — DIFFERENTIAL
Abs Immature Granulocytes: 0.08 10*3/uL — ABNORMAL HIGH (ref 0.00–0.07)
Basophils Absolute: 0.1 10*3/uL (ref 0.0–0.1)
Basophils Relative: 1 %
Eosinophils Absolute: 0.1 10*3/uL (ref 0.0–0.5)
Eosinophils Relative: 1 %
Immature Granulocytes: 1 %
Lymphocytes Relative: 18 %
Lymphs Abs: 1.5 10*3/uL (ref 0.7–4.0)
Monocytes Absolute: 2.4 10*3/uL — ABNORMAL HIGH (ref 0.1–1.0)
Monocytes Relative: 29 %
Neutro Abs: 4.2 10*3/uL (ref 1.7–7.7)
Neutrophils Relative %: 50 %

## 2022-09-26 LAB — CBC
HCT: 33.5 % — ABNORMAL LOW (ref 36.0–46.0)
Hemoglobin: 10.7 g/dL — ABNORMAL LOW (ref 12.0–15.0)
MCH: 28 pg (ref 26.0–34.0)
MCHC: 31.9 g/dL (ref 30.0–36.0)
MCV: 87.7 fL (ref 80.0–100.0)
Platelets: 151 10*3/uL (ref 150–400)
RBC: 3.82 MIL/uL — ABNORMAL LOW (ref 3.87–5.11)
RDW: 14.1 % (ref 11.5–15.5)
WBC: 8.2 10*3/uL (ref 4.0–10.5)
nRBC: 0 % (ref 0.0–0.2)

## 2022-09-26 LAB — CBG MONITORING, ED: Glucose-Capillary: 146 mg/dL — ABNORMAL HIGH (ref 70–99)

## 2022-09-26 LAB — ETHANOL: Alcohol, Ethyl (B): <10 mg/dL (ref <10)

## 2022-09-26 LAB — HEMOGLOBIN A1C
Hgb A1c MFr Bld: 5.7 % — ABNORMAL HIGH (ref 4.8–5.6)
Mean Plasma Glucose: 116.89 mg/dL

## 2022-09-26 LAB — APTT: aPTT: 33 seconds (ref 24–36)

## 2022-09-26 MED ORDER — ACETAMINOPHEN 325 MG PO TABS: 650.0000 mg | ORAL_TABLET | ORAL | Status: DC | PRN

## 2022-09-26 MED ORDER — ACETAMINOPHEN 160 MG/5ML PO SOLN
650.0000 mg | ORAL | Status: DC | PRN
  Administered 2022-09-28: 650 mg
  Filled 2022-09-26: qty 20.3

## 2022-09-26 MED ORDER — LABETALOL HCL 5 MG/ML IV SOLN: 10.0000 mg | Freq: Once | INTRAVENOUS | Status: DC

## 2022-09-26 MED ORDER — IOHEXOL 350 MG/ML SOLN
75.0000 mL | Freq: Once | INTRAVENOUS | Status: AC | PRN
  Administered 2022-09-26: 75 mL via INTRAVENOUS

## 2022-09-26 MED ORDER — TENECTEPLASE FOR STROKE
0.2500 mg/kg | PACK | Freq: Once | INTRAVENOUS | Status: AC
  Administered 2022-09-26: 15 mg via INTRAVENOUS
  Filled 2022-09-26: qty 10

## 2022-09-26 MED ORDER — ORAL CARE MOUTH RINSE: 15.0000 mL | OROMUCOSAL | Status: DC | PRN

## 2022-09-26 MED ORDER — SODIUM CHLORIDE 0.9 % IV SOLN: INTRAVENOUS | Status: DC

## 2022-09-26 MED ORDER — CHLORHEXIDINE GLUCONATE CLOTH 2 % EX PADS
6.0000 | MEDICATED_PAD | Freq: Every day | CUTANEOUS | Status: DC
  Administered 2022-09-27 – 2022-10-02 (×5): 6 via TOPICAL

## 2022-09-26 MED ORDER — ACETAMINOPHEN 650 MG RE SUPP
650.0000 mg | RECTAL | Status: DC | PRN
  Administered 2022-09-28 – 2022-10-01 (×4): 650 mg via RECTAL
  Filled 2022-09-26 (×4): qty 1

## 2022-09-26 MED ORDER — NICARDIPINE HCL IN NACL 20-0.86 MG/200ML-% IV SOLN
0.0000 mg/h | INTRAVENOUS | Status: DC | PRN
  Administered 2022-09-26: 5 mg/h via INTRAVENOUS
  Administered 2022-09-27: 2.5 mg/h via INTRAVENOUS
  Filled 2022-09-26 (×2): qty 200

## 2022-09-26 MED ORDER — SODIUM CHLORIDE 0.9% FLUSH
3.0000 mL | Freq: Once | INTRAVENOUS | Status: AC
  Administered 2022-09-26: 3 mL via INTRAVENOUS

## 2022-09-26 MED ORDER — STROKE: EARLY STAGES OF RECOVERY BOOK
Freq: Once | Status: AC
  Administered 2022-09-27: 1
  Filled 2022-09-26 (×2): qty 1

## 2022-09-26 MED ORDER — LABETALOL HCL 5 MG/ML IV SOLN
10.0000 mg | Freq: Once | INTRAVENOUS | Status: AC | PRN
  Administered 2022-09-26: 10 mg via INTRAVENOUS
  Filled 2022-09-26: qty 4

## 2022-09-26 NOTE — Code Documentation (Signed)
Stroke Response Nurse Documentation Code Documentation  Susan Conway is a 88 y.o. female arriving to [facility]  via [facility] on 09/26/22 with past medical hx of atrial fibrillation, AV block, CAD, hypertension, GERD, stroke, left sided paralysis, constipation, and dysphagia. On aspirin 81 mg daily. Code stroke was activated by EMS.   Patient from [place] where she was LKW at 1400 and working with PT when they witnessed at 1400 her speech change and pt unable to cross midline. She had a right gaze, left droop, aphasia, and her baseline left sided weakness/sensory deficits. BP 170/90 with EMS.    Stroke team at the bedside on patient arrival. Labs drawn and patient cleared for CT by Dr. Tyrone Nine. Patient to CT with team. NIHSS 24, see documentation for details and code stroke times. Patient with decreased LOC, disoriented, not following commands, right gaze preference , left hemianopia, left facial droop, bilateral arm weakness, bilateral leg weakness, left decreased sensation, Expressive aphasia , dysarthria , and Visual  neglect on exam. The following imaging was completed:  CT Head and CTA. Patient is a candidate for IV Thrombolytic due to stroke suspected. Talked with family at length about risks and benefits of TNK. This did cause a slight delay in treatment time. Patient is not a candidate for IR due to discussion with Interventionalist and pt's family.   Care Plan: Q15x2h, Q30x6h, Q1x16h, MRI (pacemaker would need to determine if compatible) at 24 hrs, BP<180/105 . PRN Labetalol written and Cardene gtt if needed for BP management.   Bedside handoff with ED RN Hanni.    Willem Klingensmith, Rande Brunt  Stroke Response RN

## 2022-09-26 NOTE — Progress Notes (Signed)
PHARMACIST CODE STROKE RESPONSE  Notified to mix TNK at 1540 by Dr. Leonel Ramsay TNK preparation completed at 1542  TNK dose = 15 mg IV over 5 seconds.   Issues/delays encountered (if applicable): N/A  Louanne Belton, PharmD, [facility] PGY1 Pharmacy Resident 09/26/2022 3:45 PM

## 2022-09-26 NOTE — ED Notes (Signed)
Dr. Leonel Ramsay notified of bp 183/86

## 2022-09-26 NOTE — H&P (Addendum)
Stroke History and Physical     Reason for Consult:code dysarthria, left facial droop, right gaze preference   CC: N/AA, poor insight   HISTORY OF PRESENT ILLNESS     History of Present Illness:   Patient is a 88 year old lady with a past medical history of atrial fibrillation not on anticoagulation due to a previous bleeding left knee Bakers cyst but maintained on aspirin, right basal ganglia ischemic infarction in 2021 with resultant left upper extremity weakness and spasticity resulting in contracture as well as left lower extremity weakness.  Also with history of coronary artery disease, external hemorrhoids, hypertension, varicose veins, pacemaker insertion.  At baseline, the patient is able to ambulate with the help of a steady with physical therapy at her skilled nursing facility, but not independently.  At baseline, she has some word finding issues.  Today, at 1400 hrs. she was witnessed at her nursing home to have a sudden change in her examination.  New symptoms not typical for her prior stroke were noted: Dense dysarthria, left facial droop, right gaze preference.  EMS was called and she presented to [facility] as a stroke alert.  After obtaining our initial NIH of 24, she was taken to CT scan where no hemorrhage was ascertained.  After a long discussion with her only daughter and son-in-law, daughter agreed to TNK administration understanding risks.  Minutes prior to receiving TNK, the patient's NIH improved to 14 with more ease in communication, following commands, naming, and right upper extremity strength.  Date last known well: 09/26/2022 Time last known well: 1400 hrs. TNK Given: See stroke nursing note for full details   Initial NIHSS:  NIHSS components Score: Comment  1a Level of Conscious 0'[]'$  1'[x]'$  2'[]'$  3'[]'$      1b LOC Questions 0'[]'$  1'[x]'$  2'[]'$       1c LOC Commands 0'[]'$  1'[]'$  2'[x]'$       2 Best Gaze 0'[]'$  1'[]'$  2'[x]'$       3 Visual 0'[]'$  1'[]'$  2'[x]'$  3'[]'$      4 Facial Palsy 0'[]'$  1'[x]'$   2'[]'$  3'[]'$      5a Motor Arm - left 0'[]'$  1'[]'$  2'[]'$  3'[]'$  4'[x]'$  UN'[]'$    5b Motor Arm - Right 0'[]'$  1'[]'$  2'[x]'$  3'[]'$  4'[]'$  UN'[]'$    6a Motor Leg - Left 0'[]'$  1'[]'$  2'[]'$  3'[x]'$  4'[]'$  UN'[]'$    6b Motor Leg - Right 0'[]'$  1'[]'$  2'[x]'$  3'[]'$  4'[]'$  UN'[]'$    7 Limb Ataxia 0'[x]'$  1'[]'$  2'[]'$  3'[]'$  UN'[]'$     8 Sensory 0'[]'$  1'[x]'$  2'[]'$  UN'[]'$      9 Best Language 0'[]'$  1'[x]'$  2'[]'$  3'[]'$      10 Dysarthria 0'[]'$  1'[]'$  2'[x]'$  UN'[]'$      11 Extinct. and Inattention 0'[]'$  1'[]'$  2'[x]'$       TOTAL: 24      Post-TNK examination   NIHSS components Score: Comment  1a Level of Conscious 0'[x]'$  1'[]'$  2'[]'$  3'[]'$      1b LOC Questions 0'[x]'$  1'[]'$  2'[]'$       1c LOC Commands 0'[x]'$  1'[]'$  2'[]'$       2 Best Gaze 0'[]'$  1'[]'$  2'[x]'$       3 Visual 0'[]'$  1'[]'$  2'[x]'$  3'[]'$      4 Facial Palsy 0'[]'$  1'[x]'$  2'[]'$  3'[]'$      5a Motor Arm - left 0'[]'$  1'[]'$  2'[]'$  3'[]'$  4'[x]'$  UN'[]'$    5b Motor Arm - Right 0'[x]'$  1'[]'$  2'[]'$  3'[]'$  4'[]'$  UN'[]'$    6a Motor Leg - Left 0'[]'$  1'[]'$  2'[]'$  3'[x]'$  4'[]'$  UN'[]'$    6b Motor Leg - Right 0'[]'$  1'[x]'$  2'[]'$  3'[]'$  4'[]'$  UN'[]'$    7 Limb  Ataxia 0'[x]'$  1'[]'$  2'[]'$  3'[]'$  UN'[]'$     8 Sensory 0'[]'$  1'[x]'$  2'[]'$  UN'[]'$      9 Best Language 0'[x]'$  1'[]'$  2'[]'$  3'[]'$      10 Dysarthria 0'[]'$  1'[]'$  2'[x]'$  UN'[]'$      11 Extinct. and Inattention 0'[]'$  1'[]'$  2'[x]'$       TOTAL: 14     Premorbid modified Rankin scale (mRS): 4   The patient denies headache, dizziness, visual changes, problems with swallowing or speech, numbness or tingling of her extremities,  abnormal movements, or other focal neurological deficits. Poor insight into deficits.    ROS: Full ROS was performed and is negative except as noted in the HPI.    PAST MEDICAL HISTORY     Past Medical History:  Diagnosis Date   Actinic keratosis 05/25/2014   Anxiety    Atrial fibrillation ([place]) 09/21/2010   Basal cell carcinoma 05/25/2014   Multiple removed by Dr. Danella Sensing in March 2015: right neck, left neck, scalp    Bell's palsy 07/18/1979   Cervicalgia 01/31/2012   Closed fracture of lumbar vertebra without mention of spinal cord injury 07/17/2005   Conjunctiva disorder 12/26/2010   Coronary atherosclerosis of native coronary artery  07/17/1997   Cramp of limb 08/16/2009   Degeneration of lumbar or lumbosacral intervertebral disc 01/31/2012   Disturbance of skin sensation 08/2009   Dizziness and giddiness 11/08/2011   vertigo   External hemorrhoids without mention of complication 8/84/1660   External hemorrhoids without mention of complication 63/11/6008   Ganglion of tendon sheath 08/16/2009   Leg cramp 10/27/2013   Most frequently the right leg.    Long term (current) use of anticoagulants 09/2010   Lumbago 07/2009   Meralgia paresthetica 07/2007   MI, old    Myalgia and myositis, unspecified 01/17/2012   Osteoporosis    Other abnormal blood chemistry 04/08/2012   Other abnormal blood chemistry 2013   hyperglycemia   Other disorder of muscle, ligament, and fascia 04/08/2012   Other specified cardiac dysrhythmias(427.89) 06/27/2010   Pacemaker 05/06/2020   Pain in joint, ankle and foot 10/27/2013   Bilateral since 1998    Pain in joint, shoulder region 08/12/2012   Pain in joint, upper arm 12/26/2010   Pain in limb 01/11/2011   Pain in thoracic spine 01/31/2012   Pathologic fracture of vertebrae 01/22/2012   PVC's (premature ventricular contractions)    Rash and other nonspecific skin eruption 08/02/2011   Senile osteoporosis 07/17/1993   Stroke ([place])    Unspecified essential hypertension 07/17/1997   Varicose veins of lower extremities 08/16/2009   Varicose veins of lower extremities 08/16/2009     Family History  Problem Relation Age of Onset   Heart attack Father 62   Heart disease Father    Hypertension Sister    Heart disease Sister    Heart disease Brother    Allergies  Allergen Reactions   Iodinated Contrast Media Nausea Only and Other (See Comments)    Severe nausea and also passed out   Iodine    Lodine [Etodolac]    Other     Seasonal Allergies.   Bactrim Rash   Relafen [Nabumetone] Rash   Sulfanilamide Rash  Medications No current facility-administered medications for this encounter.  Current  Outpatient Medications:    acetaminophen (TYLENOL) 325 MG tablet, Take 650 mg by mouth 3 (three) times daily as needed., Disp: , Rfl:    amLODipine (NORVASC) 5 MG tablet, Take 5 mg by mouth daily., Disp: , Rfl:  aspirin EC 81 MG EC tablet, Take 1 tablet (81 mg total) by mouth daily. Swallow whole., Disp: 30 tablet, Rfl: 11   atenolol (TENORMIN) 50 MG tablet, Take 50 mg by mouth 2 (two) times daily. HOLD FOR SPB <90 or HR< 60, Disp: , Rfl:    calcium-vitamin D (OSCAL WITH D) 500-200 MG-UNIT tablet, Take 1 tablet by mouth daily., Disp: , Rfl:    diclofenac Sodium (VOLTAREN) 1 % GEL, Apply 4 g topically 4 (four) times daily as needed. Right knee, left ankle, Disp: , Rfl:    digoxin (LANOXIN) 0.125 MG tablet, Take 0.125 mg by mouth every other day. Hold for apical pulse < 60 BPM, Disp: , Rfl:    docusate sodium (COLACE) 100 MG capsule, Take 1 capsule (100 mg total) by mouth daily., Disp: 30 capsule, Rfl: 0   hydrALAZINE (APRESOLINE) 10 MG tablet, Take 10 mg by mouth daily as needed. Check resident's B/P first and only administer Hydralazine '10mg'$  BID PRN for SBP greater than 170., Disp: , Rfl:    hydrochlorothiazide (HYDRODIURIL) 50 MG tablet, Take 25 mg by mouth daily. Special Instructions: for hypertension HCTZ changed to 25 mg PO  Once a morning 8:00-11:00AM, Disp: , Rfl:    levocetirizine (XYZAL) 5 MG tablet, Take 5 mg by mouth at bedtime., Disp: , Rfl:    lidocaine (LIDODERM) 5 %, Place 1 patch onto the skin as needed. Remove & Discard patch within 12 hours or as directed by MD, Disp: , Rfl:    LORazepam (ATIVAN) 1 MG tablet, Take 1 tablet (1 mg total) by mouth at bedtime., Disp: 30 tablet, Rfl: 5   losartan (COZAAR) 25 MG tablet, Take 25 mg by mouth daily., Disp: , Rfl:    ondansetron (ZOFRAN) 4 MG tablet, Take 1 tablet (4 mg total) by mouth every 6 (six) hours as needed for nausea., Disp: 20 tablet, Rfl: 0   polyethylene glycol (MIRALAX / GLYCOLAX) 17 g packet, Take 17 g by mouth daily. Mix in  4-6 oz of beverage of choice PO QD.  Once A Morning 8:00-11:00AM, Disp: , Rfl:    potassium chloride SA (KLOR-CON) 20 MEQ tablet, Take 10 mEq by mouth daily., Disp: , Rfl:     EXAMINATION        09/26/2022    3:49 PM 09/26/2022    3:47 PM 09/26/2022    3:43 PM  Vitals with BMI  Systolic  037   Diastolic  81   Pulse 048 71 65    Examination:  GENERAL: Awake, alert, anosognosia obvious HEENT: - Normocephalic and atraumatic, dry mm, no lymphadenopathy, no Thyromegally LUNGS - Clear to auscultation bilaterally CV - S1S2 RRR, equal pulses bilaterally. ABDOMEN - Soft, nontender, nondistended with normoactive BS Ext: warm, well perfused, intact peripheral pulses, no pedal edema   NEURO:  Mental Status: AA&Ox1 to person Language: speech is dysarthric.  In tact naming, repetition, mild loss of fluency, and comprehension. Cranial Nerves:  II: PERRL. Visual fields full to confrontation.  III, IV, VI: Right gaze preference. Eyelids elevate symmetrically.  Does not blink to threat on left V: Sensation intact V1-3 symmetrically  VII:  left facial asymmetry   VIII: hearing intact to voice with use of hearing aides  IX, X: Palate elevates symmetrically.  GQ:[id number] shoulder shrug at baseline on left  XII: tongue is midline without fasciculations. Motor:  RUE: At least 4/5 throughout LUE: 0/5, baseline from prior stroke RLE: At least 3/5 throughout     LLE: 1/5  distally and 2/5 hip flexor Tone: is normal and bulk is normal DTRs: Perhaps slightly brisk on left but 2+ right hemibody Sensation- Intact to light touch throughout Coordination: FTN intact on right, no ataxia in BLE., no abnormal movements  Gait- deferred    LABS   Lab Results  Component Value Date   LDLCALC 115 08/01/2022   Lab Results  Component Value Date   HGBA1C 5.7 (H) 08/19/2020   Lab Results  Component Value Date   WBC 7.3 08/01/2022   HGB 11.2 (L) 09/26/2022   HCT 33.0 (L) 09/26/2022   MCV 97.6  08/23/2020   PLT 175 08/01/2022   Lab Results  Component Value Date   NA 134 (L) 09/26/2022   K 4.2 09/26/2022   CL 104 09/26/2022   CO2 25 (A) 08/01/2022     Lab Results  Component Value Date   ALT 8 08/01/2022   AST 18 08/01/2022   ALKPHOS 86 08/01/2022   BILITOT 1.2 08/18/2020     DIAGNOSTIC IMAGING/PROCEDURES  I have reviewed the images obtained:, as below     CT-head   1. No hemorrhage or definitive CT evidence of a new infarct. 2. Redemonstrated area of acute to subacute infarct in the periventricular white matter/right corona radiata.  CTA head and neck  1. 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 2. Short-segment occlusion of a right M2 branch proximally and additional focal severe stenosis of an inferior right M2 segment proximally. 3. Occlusion of the right PCA proximally with reconstitution of flow in the P2 segment. Note that the right PCA is also fetal in origin and primarily supplied by a now occluded posterior communicating artery. 4. Additional intracranial atherosclerotic disease resulting in severe stenosis of the left A2 segment, severe stenosis of the right V4 segment, and critical stenosis/occlusion of the left vertebral artery at the V3/V4 junction with diminutive caliber of the remainder of the V4 segment. 5. Calcified plaque in the left carotid bifurcation resulting in approximately 50% stenosis. 6. Severe stenosis at the origin of the right vertebral artery. 7. 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 Recommend vascular surgery referral.    ASSESSMENT/PLAN     Assessment:   Patient is a 88 year old lady with a history of atrial fibrillation not on anticoagulation due to left knee  Bakers cyst a few years back and a prior stroke in 2021 of right basal ganglia resulting in residual left upper extremity weakness/spasticity/contracture and left lower extremity weakness.  She presents today with sudden onset dysarthria and left facial droop and was given TNK.  CTA revealed multifocal occlusions in right ICA throughout its course extracranially with reconstitution of flow at right M1, short segment occlusion of right M2 approximately as well as severe focal stenosis of right M2 inferior branch, right PCA occlusion with reconstitution P2 segment and with fetal PCA supplied by now occluded posterior communicating artery, multifocal stenoses of left ACA, right vertebral, and left vertebral arteries.  Especially in setting of calcified plaque in addition to fresh thrombi, thrombectomy or other intervention for these multifocal occlusions would very likely cause more harm to the patient than provide any benefit and this was discussed with the patient's family.  Impression:  Right MCA stroke in setting of multifocal occlusions, likely cardioembolic  in setting of afib not on anticoagulation      Recommendations: -will not pursue mechanical thrombectomy  -admitted to stroke service  -MRI brain without contrast  -echocardiogram  -Hga1c , Lipid panel  -BP goal <180 s/p TNK  (-Labetalol IV x 1 prn followed by Cleviprex prn for BP goals.) -telemetry monitoring, although known atrial fibrillation established  -NIHSS and neurology checks per protocol.  -CTH stat for any change in neurological status.  -PT/OT/SLP evaluation  -goals of care discussion ongoing     Prophylaxis: -SCDs only, no chemical VTE , restart 24 hours after TNK    Diet:  -NPO until passes RN swallow evaluation and/or SLP evaluation.  -Advance diet as able.   -start on IV fluids since will be NPO   Patient seen by and discussed with Dr. Leonel Ramsay.  -- Lennie Hummer, PA-C Neurology Department    **This  documentation was dictated using [place] and may contain inadvertent errors **   I was present for the entirety of the evaluation and management reflected in the above note.  She presents with right gaze, left-sided neglect, left hemianopia which are definitely new, as well as I suspect some worsening of her baseline left-sided weakness.  She was here within the timeframe for IV tenecteplase and I discussed this with the family and we did pursue this.  There was delay secondary to prolonged family decision making and needing to discuss with multiple family members.  She does have large vessel occlusions of the ICA, MCA, PCA.  I discussed this case with Dr. Karenann Cai, and her case would be significantly more difficult than average.  She would certainly need to have a carotid stent, if it was even possible given that the chronicity is not clear.  In a 88 year old with an MRS of 4, I think that the likelihood of her having a good outcome would be fairly small with significant risk of doing harm.  Especially in someone who has previously stated that she would not want to be intubated, and likely would need continued intubation after the procedure, I do not feel that the risk/benefit ratio is in favor of intervention at this time.  I discussed this with the family who expressed understanding and agreement.  My suspicion is that she will end up with a very large stroke, I discussed with the family the possibility of swelling and worsening over time, but this is not definite.  They do confirm that her wishes have been to be a DNR in the setting of cardiac or respiratory arrest.  She will need to be admitted to the ICU for post TNK monitoring and may end up needing other measures such as hypertonic saline, etc. if indicated.  This patient is critically ill and at significant risk of neurological worsening, death and care requires constant monitoring of vital signs,  hemodynamics,respiratory and cardiac monitoring, neurological assessment, discussion with family, other specialists and medical decision making of high complexity. I spent 65 minutes of neurocritical care time  in the care of  this patient. This was time spent independent of any time provided by nurse practitioner or PA.  Roland Rack, MD [facility] [phone]  If 7pm- 7am, please page neurology on call as listed in [place]. 09/26/2022  5:15 PM

## 2022-09-26 NOTE — ED Triage Notes (Signed)
Pt bib ems from [facility]; staff noted acute change at 1400, L sided facial droop, aphasia, R sided gaze; hx stroke, L side sensory deficits and weakness at baseline; cbg 139, 170/90, HR 70, 94% RA, 20 ga LH, 18 ga RAC, hx afib

## 2022-09-26 NOTE — ED Notes (Signed)
ED TO INPATIENT HANDOFF REPORT  ED Nurse Name and Phone #: [phone]  S Name/Age/Gender Kelton Pillar 88 y.o. female Room/Bed: 013C/013C  Code Status   Code Status: DNR  Home/SNF/Other Nursing Home Patient oriented to: self, place, time, and situation Is this baseline? Yes   Triage Complete: Triage complete  Chief Complaint Stroke (cerebrum) ([place]) [I63.9]  Triage Note [facility] bib [facility]; staff noted acute change at 1400, L sided facial droop, aphasia, R sided gaze; hx stroke, L side sensory deficits and weakness at baseline; cbg 139, 170/90, HR 70, 94% RA, 20 ga LH, 18 ga RAC, hx afib   Allergies Allergies  Allergen Reactions   Iodinated Contrast Media Nausea Only and Other (See Comments)    Severe nausea and also passed out   Iodine Nausea Only and Other (See Comments)    "Allergic," per MAR- PASSED OUT   Lodine [Etodolac] Other (See Comments)    "Allergic," per MAR   Other Other (See Comments)    Seasonal Allergies   Bactrim Rash   Relafen [Nabumetone] Rash   Sulfanilamide Rash    Level of Care/Admitting Diagnosis ED Disposition     ED Disposition  Admit   Condition  --   [place]: [facility] [[id number]]  Level of Care: ICU [6]  May admit patient to [facility] or [facility] if equivalent level of care is available:: No  Covid Evaluation: Confirmed COVID Negative  Diagnosis: Stroke (cerebrum) [facility]) [[id number]]  Admitting Physician: Greta Doom [place]  Attending Physician: Leonel Ramsay, MCNEILL P [[id number]]  Certification:: I certify this patient will need inpatient services for at least 2 midnights  Estimated Length of Stay: 2          B Medical/Surgery History Past Medical History:  Diagnosis Date   Actinic keratosis 05/25/2014   Anxiety    Atrial fibrillation ([place]) 09/21/2010   Basal cell carcinoma 05/25/2014   Multiple removed by Dr. Danella Sensing in March 2015: right neck, left neck, scalp    Bell's palsy  07/18/1979   Cervicalgia 01/31/2012   Closed fracture of lumbar vertebra without mention of spinal cord injury 07/17/2005   Conjunctiva disorder 12/26/2010   Coronary atherosclerosis of native coronary artery 07/17/1997   Cramp of limb 08/16/2009   Degeneration of lumbar or lumbosacral intervertebral disc 01/31/2012   Disturbance of skin sensation 08/2009   Dizziness and giddiness 11/08/2011   vertigo   External hemorrhoids without mention of complication 2/72/5366   External hemorrhoids without mention of complication 44/01/4741   Ganglion of tendon sheath 08/16/2009   Leg cramp 10/27/2013   Most frequently the right leg.    Long term (current) use of anticoagulants 09/2010   Lumbago 07/2009   Meralgia paresthetica 07/2007   MI, old    Myalgia and myositis, unspecified 01/17/2012   Osteoporosis    Other abnormal blood chemistry 04/08/2012   Other abnormal blood chemistry 2013   hyperglycemia   Other disorder of muscle, ligament, and fascia 04/08/2012   Other specified cardiac dysrhythmias(427.89) 06/27/2010   Pacemaker 05/06/2020   Pain in joint, ankle and foot 10/27/2013   Bilateral since 1998    Pain in joint, shoulder region 08/12/2012   Pain in joint, upper arm 12/26/2010   Pain in limb 01/11/2011   Pain in thoracic spine 01/31/2012   Pathologic fracture of vertebrae 01/22/2012   PVC's (premature ventricular contractions)    Rash and other nonspecific skin eruption 08/02/2011   Senile osteoporosis  07/17/1993   Stroke ([place])    Unspecified essential hypertension 07/17/1997   Varicose veins of lower extremities 08/16/2009   Varicose veins of lower extremities 08/16/2009   Past Surgical History:  Procedure Laterality Date   COLONOSCOPY WITH PROPOFOL N/A 10/19/2015   Procedure: COLONOSCOPY WITH PROPOFOL;  Surgeon: Gatha Mayer, MD;  Location: [facility];  Service: Endoscopy;  Laterality: N/A;   CORONARY ARTERY BYPASS GRAFT  1998   x2; LIMA to LAD; SVG to diagonal off bypass   HEMORRHOID  SURGERY  08/26/2012   Dr. Brantley Stage   LOOP RECORDER INSERTION N/A 05/13/2018   Procedure: LOOP RECORDER INSERTION;  Surgeon: [name], MD;  Location: [facility];  Service: Cardiovascular;  Laterality: N/A;   LOOP RECORDER REMOVAL N/A 05/26/2019   Procedure: LOOP RECORDER REMOVAL;  Surgeon: [name], MD;  Location: [facility];  Service: Cardiovascular;  Laterality: N/A;   MASS EXCISION Left 11/23/2015   Procedure: LEFT WRIST EXCISION CYST;  Surgeon: Leanora Cover, MD;  Location: [facility];  Service: Orthopedics;  Laterality: Left;   PACEMAKER IMPLANT N/A 05/26/2019   Procedure: PACEMAKER IMPLANT;  Surgeon: [name], MD;  Location: [facility];  Service: Cardiovascular;  Laterality: N/A;   SKIN BIOPSY  01/29/14   (R) neck; (R) scalp, 2 (L) neck; shave biopsy Superficial basal cell carcinoma Dr. Danella Sensing   TONSILLECTOMY  [phone]     A IV Location/Drains/Wounds Patient Lines/Drains/Airways Status     Active Line/Drains/Airways     Name Placement date Placement time Site Days   Peripheral IV 09/26/22 18 G Right Antecubital 09/26/22  --  Antecubital  less than 1   Peripheral IV 09/26/22 20 G Left Hand 09/26/22  --  Hand  less than 1   External Urinary Catheter 08/20/20  1950  --  767   Incision (Closed) 11/23/15 Hand Left 11/23/15  1412  -- 2499   Pressure Injury 08/18/20 Knee Left;Posterior Stage 1 -  Intact skin with non-blanchable redness of a localized area usually over a bony prominence. 08/18/20  0200  -- 769   Wound / Incision (Open or Dehisced) 08/18/20 Skin tear Elbow Left 08/18/20  0230  Elbow  769   Wound / Incision (Open or Dehisced) 08/18/20 Incision - Open Ankle Right 08/18/20  0200  Ankle  769            Intake/Output Last 24 hours No intake or output data in the 24 hours ending 09/26/22 1757  Labs/Imaging Results for orders placed or performed during the hospital encounter of 09/26/22 (from the past 48 hour(s))   Ethanol     Status: None   Collection Time: 09/26/22  3:11 PM  Result Value Ref Range   Alcohol, Ethyl (B) <10 <10 mg/dL    Comment: (NOTE) Lowest detectable limit for serum alcohol is 10 mg/dL.  For medical purposes only. Performed at [facility], [place] [address] [place], [place] [address]   I-stat chem 8, ED     Status: Abnormal   Collection Time: 09/26/22  3:17 PM  Result Value Ref Range   Sodium 134 (L) 135 - 145 mmol/L   Potassium 4.2 3.5 - 5.1 mmol/L   Chloride 104 98 - 111 mmol/L   BUN 34 (H) 8 - 23 mg/dL   Creatinine, Ser 1.40 (H) 0.44 - 1.00 mg/dL   Glucose, Bld 89 70 - 99 mg/dL    Comment: Glucose reference range applies  only to samples taken after fasting for at least 8 hours.   Calcium, Ion 0.94 (L) 1.15 - 1.40 mmol/L   TCO2 26 22 - 32 mmol/L   Hemoglobin 11.2 (L) 12.0 - 15.0 g/dL   HCT 33.0 (L) 36.0 - 46.0 %  Protime-INR     Status: Abnormal   Collection Time: 09/26/22  3:39 PM  Result Value Ref Range   Prothrombin Time 15.5 (H) 11.4 - 15.2 seconds   INR 1.2 0.8 - 1.2    Comment: (NOTE) INR goal varies based on device and disease states. Performed at [facility], [place] [address], [place], [place] [address]   APTT     Status: None   Collection Time: 09/26/22  3:39 PM  Result Value Ref Range   aPTT 33 24 - 36 seconds    Comment: Performed at [facility] [address] [place], [place] [address]  CBC     Status: Abnormal   Collection Time: 09/26/22  3:39 PM  Result Value Ref Range   WBC 8.2 4.0 - 10.5 K/uL   RBC 3.82 (L) 3.87 - 5.11 MIL/uL   Hemoglobin 10.7 (L) 12.0 - 15.0 g/dL   HCT 33.5 (L) 36.0 - 46.0 %   MCV 87.7 80.0 - 100.0 fL   MCH 28.0 26.0 - 34.0 pg   MCHC 31.9 30.0 - 36.0 g/dL   RDW 14.1 11.5 - 15.5 %   Platelets 151 150 - 400 K/uL   nRBC 0.0 0.0 - 0.2 %    Comment: Performed at [facility], [place] [address], [place], [place] [address]  Differential     Status: Abnormal   Collection Time: 09/26/22  3:39 PM   Result Value Ref Range   Neutrophils Relative % 50 %   Neutro Abs 4.2 1.7 - 7.7 K/uL   Lymphocytes Relative 18 %   Lymphs Abs 1.5 0.7 - 4.0 K/uL   Monocytes Relative 29 %   Monocytes Absolute 2.4 (H) 0.1 - 1.0 K/uL   Eosinophils Relative 1 %   Eosinophils Absolute 0.1 0.0 - 0.5 K/uL   Basophils Relative 1 %   Basophils Absolute 0.1 0.0 - 0.1 K/uL   Immature Granulocytes 1 %   Abs Immature Granulocytes 0.08 (H) 0.00 - 0.07 K/uL    Comment: Performed at [facility] [address] [place], [place] [address]  Comprehensive metabolic panel     Status: Abnormal   Collection Time: 09/26/22  3:39 PM  Result Value Ref Range   Sodium 135 135 - 145 mmol/L   Potassium 4.2 3.5 - 5.1 mmol/L   Chloride 94 (L) 98 - 111 mmol/L   CO2 29 22 - 32 mmol/L   Glucose, Bld 79 70 - 99 mg/dL    Comment: Glucose reference range applies only to samples taken after fasting for at least 8 hours.   BUN 33 (H) 8 - 23 mg/dL   Creatinine, Ser 1.40 (H) 0.44 - 1.00 mg/dL   Calcium 9.2 8.9 - 10.3 mg/dL   Total Protein 7.1 6.5 - 8.1 g/dL   Albumin 3.1 (L) 3.5 - 5.0 g/dL   AST 17 15 - 41 U/L   ALT 12 0 - 44 U/L   Alkaline Phosphatase 71 38 - 126 U/L   Total Bilirubin 0.3 0.3 - 1.2 mg/dL   GFR, Estimated 36 (L) >60 mL/min    Comment: (NOTE) Calculated using the CKD-EPI Creatinine Equation (2021)    Anion  gap 12 5 - 15    Comment: Performed at [facility], [place] [address], [place], [place] [address]  Hemoglobin A1c     Status: Abnormal   Collection Time: 09/26/22  4:20 PM  Result Value Ref Range   Hgb A1c MFr Bld 5.7 (H) 4.8 - 5.6 %    Comment: (NOTE) Pre diabetes:          5.7%-6.4%  Diabetes:              >6.4%  Glycemic control for   <7.0% adults with diabetes    Mean Plasma Glucose 116.89 mg/dL    Comment: Performed at [facility] [address], [place], [place] [address]   CT ANGIO HEAD NECK W WO CM (CODE STROKE)  Result Date: 09/26/2022 CLINICAL DATA:  Code stroke EXAM:  CT ANGIOGRAPHY HEAD AND NECK TECHNIQUE: Multidetector CT imaging of the head and neck was performed using the standard protocol during bolus administration of intravenous contrast. Multiplanar CT image reconstructions and MIPs were obtained to evaluate the vascular anatomy. Carotid stenosis measurements (when applicable) are obtained utilizing NASCET criteria, using the distal internal carotid diameter as the denominator. RADIATION DOSE REDUCTION: This exam was performed according to the departmental dose-optimization program which includes automated exposure control, adjustment of the mA and/or kV according to patient size and/or use of iterative reconstruction technique. CONTRAST:  76m OMNIPAQUE IOHEXOL 350 MG/ML SOLN COMPARISON:  Same-day noncontrast CT head, MR head and MRA head/neck 08/19/2020 FINDINGS: CTA NECK FINDINGS Aortic arch: There is calcified plaque in the imaged aortic arch. 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 The origins of the major branch vessels are patent with scattered soft and calcified plaque but no high-grade stenosis. Right carotid system: The right common carotid artery is patent but poorly opacified due to the downstream occlusion. The right internal carotid artery is occluded throughout its course. The external carotid artery is patent but poorly opacified. Left carotid system: The left common carotid artery is patent. There is calcified plaque in the proximal internal carotid artery resulting in up to approximately 50% stenosis. The distal internal carotid artery is patent but tortuous in the neck. The external carotid artery is patent. There is no dissection or aneurysm. Vertebral arteries: There is severe stenosis of the origin of the right vertebral artery and calcified plaque  in the V1 segment resulting in mild stenosis. There is additional calcified plaque in the V3 segment resulting in mild-to-moderate stenosis. The left vertebral artery is patent without hemodynamically significant stenosis or occlusion in the neck. There is no evidence of dissection or aneurysm. Skeleton: There is multilevel degenerative change of the cervical spine. There is no acute osseous abnormality or suspicious osseous lesion. There is no visible canal hematoma. Other neck: The soft tissues are unremarkable. Upper chest: There is scarring in the lung apices. Review of the MIP images confirms the above findings CTA HEAD FINDINGS Anterior circulation: The right ICA is occluded throughout its course with reconstitution of flow at the M1 segment. There is short-segment occlusion of a right M2 branch proximally (8-108, 7-91). There is additional focal severe stenosis of an inferior right M2 segment proximally (7-96). There is atherosclerotic irregularity and narrowing in the remainder of the right MCA branches. There is calcified plaque in the  left intracranial ICA resulting in up to mild-to-moderate stenosis of the supraclinoid segment. There is mild stenosis of the left M1 segment. The distal left MCA branches appear patent without proximal high-grade stenosis or occlusion. The bilateral ACAs are patent with focal severe stenosis of the left A2 segment (7-67). There is no other proximal high-grade stenosis or occlusion. There is no aneurysm or AVM. Posterior circulation: There is plaque in the right V4 segment resulting in severe stenosis proximally. There is critical stenosis/occlusion of the left vertebral artery at the V3/V4 junction with diminutive caliber of the remainder of the V4 segment. The basilar artery is patent with mild irregularity. The major cerebellar arteries appear patent. There is a fetal origin of the left PCA the left PCA appears patent, with mild-to-moderate stenosis of the proximal P2  segment (100-152). The right PCA is not identified proximally there is reconstitution of flow in the P2 segment (100-161). Note that the right PCA is also fetal in origin and primarily supplied by a now occluded posterior communicating artery. Venous sinuses: Suboptimally evaluated due to bolus timing. Anatomic variants: As above. Review of the MIP images confirms the above findings IMPRESSION: 1. 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 2. Short-segment occlusion of a right M2 branch proximally and additional focal severe stenosis of an inferior right M2 segment proximally. 3. Occlusion of the right PCA proximally with reconstitution of flow in the P2 segment. Note that the right PCA is also fetal in origin and primarily supplied by a now occluded posterior communicating artery. 4. Additional intracranial atherosclerotic disease resulting in severe stenosis of the left A2 segment, severe stenosis of the right V4 segment, and critical stenosis/occlusion of the left vertebral artery at the V3/V4 junction with diminutive caliber of the remainder of the V4 segment. 5. Calcified plaque in the left carotid bifurcation resulting in approximately 50% stenosis. 6. Severe stenosis at the origin of the right vertebral artery. 7. 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 Recommend vascular surgery referral. Findings were communicated with Dr. Leonel Ramsay at 3:55 p.m. Electronically Signed   By: Valetta Mole M.D.   On: 09/26/2022 16:02   CT HEAD CODE STROKE WO CONTRAST  Addendum Date: 09/26/2022   ADDENDUM REPORT: 09/26/2022 15:57 ADDENDUM: Impression #2 should state chronic infract in the periventricular white matter/right corona radiata Electronically Signed   By:  Marin Roberts M.D.   On: 09/26/2022 15:57   Result Date: 09/26/2022 CLINICAL DATA:  Code stroke. EXAM: CT HEAD WITHOUT CONTRAST TECHNIQUE: Contiguous axial images were obtained from the base of the skull through the vertex without intravenous contrast. RADIATION DOSE REDUCTION: This exam was performed according to the departmental dose-optimization program which includes automated exposure control, adjustment of the mA and/or kV according to patient size and/or use of iterative reconstruction technique. COMPARISON:  08/17/20 CT brain, 08/19/20 MRI brain FINDINGS: Brain: No evidence of acute infarction, hemorrhage, hydrocephalus, extra-axial collection or mass lesion/mass effect. Redemonstrated generalized volume loss with sequela of moderate to severe chronic microvascular ischemic change. Redemonstrated area of acute to subacute infarct in the periventricular white matter/right corona radiata (series 100, image 26). Vascular: No hyperdense vessel or unexpected calcification. Skull: Normal. Negative for fracture or focal lesion. Sinuses/Orbits: Bilateral lens replacements. Paranasal sinuses are clear. Other: None. ASPECTS (  [place] Stroke Program Early CT Score): 10 IMPRESSION: 1. No hemorrhage or definitive CT evidence of a new infarct. 2. Redemonstrated area of acute to subacute infarct in the periventricular white matter/right corona radiata. Findings were paged to Dr. Leonel Ramsay on 09/26/22 at 3:31 PM. Electronically Signed: By: Marin Roberts M.D. On: 09/26/2022 15:31    Pending Labs Unresulted Labs (From admission, onward)     Start     Ordered   09/27/22 0500  Lipid panel  (Labs)  Tomorrow morning,   R       Comments: Fasting    09/26/22 1620            Vitals/Pain Today's Vitals   09/26/22 1650 09/26/22 1700 09/26/22 1715 09/26/22 1730  BP: (!) 165/79 (!) 173/83 (!) 173/82 (!) 170/82  Pulse: 71 71 77 70  Resp: '20 20 17 18  '$ Temp:      TempSrc:      SpO2: 100% 100% 100% 100%   Weight:        Isolation Precautions No active isolations  Medications Medications   stroke: early stages of recovery book (has no administration in time range)  0.9 %  sodium chloride infusion ( Intravenous New Bag/Given 09/26/22 1654)  acetaminophen (TYLENOL) tablet 650 mg (has no administration in time range)    Or  acetaminophen (TYLENOL) 160 MG/5ML solution 650 mg (has no administration in time range)    Or  acetaminophen (TYLENOL) suppository 650 mg (has no administration in time range)  labetalol (NORMODYNE) injection 10 mg (10 mg Intravenous Given 09/26/22 1634)    And  nicardipine (CARDENE) '20mg'$  in 0.86% saline 268m IV infusion (0.1 mg/ml) (has no administration in time range)  sodium chloride flush (NS) 0.9 % injection 3 mL (3 mLs Intravenous Given 09/26/22 1546)  iohexol (OMNIPAQUE) 350 MG/ML injection 75 mL (75 mLs Intravenous Contrast Given 09/26/22 1532)  tenecteplase (TNKASE) injection for Stroke 15 mg (15 mg Intravenous Given 09/26/22 1543)    Mobility non-ambulatory     Focused Assessments    R Recommendations: See Admitting Provider Note  Report given to:   Additional Notes:

## 2022-09-26 NOTE — Progress Notes (Signed)
Location:  [place] Room Number: 118/A Place of Service:  SNF [phone]) Provider:  Yvonna Alanis, NP   [name], MD  Patient Care Team: [name], MD as PCP - General (Internal Medicine) [name], MD as PCP - Electrophysiology (Cardiology) [name], MD as Consulting Physician (Dermatology) [name], MD as Consulting Physician (Cardiology) [facility] [name], MD as Consulting Physician (Dermatology)  Extended Emergency Contact Information Primary Emergency Contact: [name] of [place] Phone: [phone] Mobile Phone: [phone] Relation: Daughter Secondary Emergency Contact: [name],[name]  [name] of [place] Phone: [phone] Relation: Son  Code Status:  DNR Goals of care: Advanced Directive information    08/29/2022   10:16 AM  Advanced Directives  Does Patient Have a Medical Advance Directive? Yes  Type of Advance Directive Living will;Out of facility DNR (pink MOST or yellow form)  Does patient want to make changes to medical advance directive? No - Patient declined  Pre-existing out of facility DNR order (yellow form or pink MOST form) Pink Most/Yellow Form available - Physician notified to receive inpatient order;Yellow form placed in chart (order not valid for inpatient use)     Chief Complaint  Patient presents with   Acute Visit    Slurred speech    HPI:  Pt is a 88 y.o. female seen today for acute visit due to slurred speech.   She currently resides on the skilled nursing unit at [facility]. Past medical history includes: atrial fibrillation, AV block, CAD, hypertension, GERD, stroke, left sided paralysis, constipation, and dysphagia.    Around 2 pm nursing staff assisted her to the bathroom. When they returned, her speech had changed. Blood sugar 139. BP 169/89, HR 63, Temp 97.5, RR 17/ 95% on room air. During our encounter her  speech was prolonged and slightly slurred, some drooling to left side. She also has a upward right sided gaze. She is alert and able to answer simple questions, like daughters name. H/o right basal ganglia stroke 08/17/2020. She is not on anticoagulation due to h/o GI bleed on coumadin and ruptured Bakers cyst on Eliquis. She does take aspirin 81 mg daily. She is not on statin due to intolerance.      Past Medical History:  Diagnosis Date   Actinic keratosis 05/25/2014   Anxiety    Atrial fibrillation ([place]) 09/21/2010   Basal cell carcinoma 05/25/2014   Multiple removed by Dr. Danella Sensing in March 2015: right neck, left neck, scalp    Bell's palsy 07/18/1979   Cervicalgia 01/31/2012   Closed fracture of lumbar vertebra without mention of spinal cord injury 07/17/2005   Conjunctiva disorder 12/26/2010   Coronary atherosclerosis of native coronary artery 07/17/1997   Cramp of limb 08/16/2009   Degeneration of lumbar or lumbosacral intervertebral disc 01/31/2012   Disturbance of skin sensation 08/2009   Dizziness and giddiness 11/08/2011   vertigo   External hemorrhoids without mention of complication 05/31/3663   External hemorrhoids without mention of complication 40/34/7425   Ganglion of tendon sheath 08/16/2009   Leg cramp 10/27/2013   Most frequently the right leg.    Long term (current) use of anticoagulants 09/2010   Lumbago 07/2009   Meralgia paresthetica 07/2007   MI, old    Myalgia and myositis, unspecified 01/17/2012   Osteoporosis    Other abnormal blood chemistry 04/08/2012   Other abnormal blood chemistry 2013   hyperglycemia   Other disorder of  muscle, ligament, and fascia 04/08/2012   Other specified cardiac dysrhythmias(427.89) 06/27/2010   Pacemaker 05/06/2020   Pain in joint, ankle and foot 10/27/2013   Bilateral since 1998    Pain in joint, shoulder region 08/12/2012   Pain in joint, upper arm 12/26/2010   Pain in limb 01/11/2011   Pain in thoracic spine 01/31/2012   Pathologic  fracture of vertebrae 01/22/2012   PVC's (premature ventricular contractions)    Rash and other nonspecific skin eruption 08/02/2011   Senile osteoporosis 07/17/1993   Stroke ([place])    Unspecified essential hypertension 07/17/1997   Varicose veins of lower extremities 08/16/2009   Varicose veins of lower extremities 08/16/2009   Past Surgical History:  Procedure Laterality Date   COLONOSCOPY WITH PROPOFOL N/A 10/19/2015   Procedure: COLONOSCOPY WITH PROPOFOL;  Surgeon: Gatha Mayer, MD;  Location: [facility];  Service: Endoscopy;  Laterality: N/A;   CORONARY ARTERY BYPASS GRAFT  1998   x2; LIMA to LAD; SVG to diagonal off bypass   HEMORRHOID SURGERY  08/26/2012   Dr. Brantley Stage   LOOP RECORDER INSERTION N/A 05/13/2018   Procedure: LOOP RECORDER INSERTION;  Surgeon: [name], MD;  Location: [place] [facility];  Service: Cardiovascular;  Laterality: N/A;   LOOP RECORDER REMOVAL N/A 05/26/2019   Procedure: LOOP RECORDER REMOVAL;  Surgeon: [name], MD;  Location: [place] [facility];  Service: Cardiovascular;  Laterality: N/A;   MASS EXCISION Left 11/23/2015   Procedure: LEFT WRIST EXCISION CYST;  Surgeon: Leanora Cover, MD;  Location: [place];  Service: Orthopedics;  Laterality: Left;   PACEMAKER IMPLANT N/A 05/26/2019   Procedure: PACEMAKER IMPLANT;  Surgeon: [name], MD;  Location: [place] [facility];  Service: Cardiovascular;  Laterality: N/A;   SKIN BIOPSY  01/29/14   (R) neck; (R) scalp, 2 (L) neck; shave biopsy Superficial basal cell carcinoma Dr. Danella Sensing   TONSILLECTOMY  [phone]    Allergies  Allergen Reactions   Iodinated Contrast Media Nausea Only and Other (See Comments)    Severe nausea and also passed out   Iodine    Lodine [Etodolac]    Other     Seasonal Allergies.   Bactrim Rash   Relafen [Nabumetone] Rash   Sulfanilamide Rash    Outpatient Encounter Medications as of 09/26/2022  Medication Sig   acetaminophen (TYLENOL) 325 MG  tablet Take 650 mg by mouth 3 (three) times daily as needed.   amLODipine (NORVASC) 5 MG tablet Take 5 mg by mouth daily.   aspirin EC 81 MG EC tablet Take 1 tablet (81 mg total) by mouth daily. Swallow whole.   atenolol (TENORMIN) 50 MG tablet Take 50 mg by mouth 2 (two) times daily. HOLD FOR SPB <90 or HR< 60   calcium-vitamin D (OSCAL WITH D) 500-200 MG-UNIT tablet Take 1 tablet by mouth daily.   diclofenac Sodium (VOLTAREN) 1 % GEL Apply 4 g topically 4 (four) times daily as needed. Right knee, left ankle   digoxin (LANOXIN) 0.125 MG tablet Take 0.125 mg by mouth every other day. Hold for apical pulse < 60 BPM   docusate sodium (COLACE) 100 MG capsule Take 1 capsule (100 mg total) by mouth daily.   hydrALAZINE (APRESOLINE) 10 MG tablet Take 10 mg by mouth daily as needed. Check resident's B/P first and only administer Hydralazine '10mg'$  BID PRN for SBP greater than 170.   hydrochlorothiazide (HYDRODIURIL) 50 MG tablet Take 25 mg by mouth daily. Special Instructions:  for hypertension HCTZ changed to 25 mg PO  Once a morning 8:00-11:00AM   levocetirizine (XYZAL) 5 MG tablet Take 5 mg by mouth at bedtime.   lidocaine (LIDODERM) 5 % Place 1 patch onto the skin as needed. Remove & Discard patch within 12 hours or as directed by MD   LORazepam (ATIVAN) 1 MG tablet Take 1 tablet (1 mg total) by mouth at bedtime.   losartan (COZAAR) 25 MG tablet Take 25 mg by mouth daily.   ondansetron (ZOFRAN) 4 MG tablet Take 1 tablet (4 mg total) by mouth every 6 (six) hours as needed for nausea.   polyethylene glycol (MIRALAX / GLYCOLAX) 17 g packet Take 17 g by mouth daily. Mix in 4-6 oz of beverage of choice PO QD.  Once A Morning 8:00-11:00AM   potassium chloride SA (KLOR-CON) 20 MEQ tablet Take 10 mEq by mouth daily.   No facility-administered encounter medications on file as of 09/26/2022.    Review of Systems  Constitutional:  Positive for activity change. Negative for fatigue and fever.  HENT:  Positive  for drooling.   Eyes:  Negative for visual disturbance.  Respiratory:  Negative for cough, shortness of breath and wheezing.   Cardiovascular:  Negative for chest pain and leg swelling.  Gastrointestinal:  Negative for nausea and vomiting.  Genitourinary:  Negative for dysuria.  Musculoskeletal:  Positive for gait problem.  Neurological:  Positive for facial asymmetry, speech difficulty and weakness. Negative for dizziness.  Psychiatric/Behavioral:  Negative for confusion and dysphoric mood.     Immunization History  Administered Date(s) Administered   Influenza Whole 08/06/2012   Influenza, High Dose Seasonal PF 08/15/2019, 08/27/2020   Influenza,inj,Quad PF,6+ Mos 08/30/2018   Influenza-Unspecified 08/06/2013, 08/24/2014, 08/26/2015, 08/31/2016, 08/27/2017, 08/27/2020   Moderna Sars-Covid-2 Vaccination 11/16/2019, 12/16/2019, 09/16/2020   Pneumococcal Conjugate-13 09/22/2015   Pneumococcal Polysaccharide-23 08/29/2006   Td 10/07/2007   Zoster Recombinat (Shingrix) 02/12/2018, 05/13/2018   Zoster, Live 03/12/2008   Pertinent  Health Maintenance Due  Topic Date Due   INFLUENZA VACCINE  06/06/2022   DEXA SCAN  Completed      08/23/2020    9:56 AM 08/23/2020   10:33 PM 08/26/2020    6:15 AM 09/23/2020   11:32 AM 09/27/2021   10:28 AM  Fall Risk  Falls in the past year?   1 1 0  Was there an injury with Fall?   1 1 0  Was there an injury with Fall? - Comments    Patient fractured pelvis and had a stroke   Fall Risk Category Calculator   2 2 0  Fall Risk Category   Moderate Moderate Low  Patient Fall Risk Level High fall risk High fall risk  Moderate fall risk Low fall risk  Patient at Risk for Falls Due to   History of fall(s);Impaired balance/gait;Impaired mobility;Orthopedic patient  No Fall Risks  Fall risk Follow up   Falls evaluation completed;Education provided;Falls prevention discussed;Follow up appointment  Falls evaluation completed   Functional Status Survey:     Vitals:   09/26/22 1457  BP: (!) 169/89  Pulse: 63  Resp: 17  Temp: (!) 97.5 F (36.4 C)  SpO2: 95%  Weight: 130 lb (59 kg)  Height: '5\' 6"'$  (1.676 m)   Body mass index is 20.98 kg/m. Physical Exam Vitals reviewed.  Constitutional:      General: She is not in acute distress. HENT:     Head: Normocephalic.  Eyes:     General:  Right eye: No discharge.        Left eye: No discharge.     Comments: Upward, right sided gaze  Cardiovascular:     Rate and Rhythm: Normal rate. Rhythm irregular.     Pulses: Normal pulses.     Heart sounds: Normal heart sounds.  Pulmonary:     Effort: Pulmonary effort is normal. No respiratory distress.     Breath sounds: Normal breath sounds. No wheezing.  Abdominal:     General: Bowel sounds are normal. There is no distension.     Palpations: Abdomen is soft.     Tenderness: There is no abdominal tenderness.  Musculoskeletal:     Right lower leg: No edema.     Left lower leg: No edema.  Skin:    General: Skin is warm and dry.     Capillary Refill: Capillary refill takes less than 2 seconds.  Neurological:     General: No focal deficit present.     Mental Status: She is alert. Mental status is at baseline.     Motor: Weakness present.     Gait: Gait abnormal.     Comments: Left sided hemiparesis, asymmetrical smile, drooling present  Psychiatric:        Mood and Affect: Mood normal.        Speech: Speech is delayed and slurred.        Behavior: Behavior normal.     Labs reviewed: Recent Labs    04/19/22 0000 05/08/22 0000 08/01/22 0000  NA 136* 142 136*  K 4.3 4.4 4.3  CL 95* 102 97*  CO2 29* 29* 25*  BUN 23* 24* 27*  CREATININE 1.1 1.2* 1.3*  CALCIUM 9.3 9.2 9.3   Recent Labs    08/01/22 0000  AST 18  ALT 8  ALKPHOS 86  ALBUMIN 3.8   Recent Labs    01/13/22 0000 02/02/22 0000 08/01/22 0000  WBC 6.2 7.4 7.3  HGB 10.4* 10.8* 10.9*  HCT 31* 32* 33*  PLT 143* 138* 175   Lab Results  Component Value  Date   TSH 2.87 02/03/2022   Lab Results  Component Value Date   HGBA1C 5.7 (H) 08/19/2020   Lab Results  Component Value Date   CHOL 174 08/01/2022   HDL 43 08/01/2022   LDLCALC 115 08/01/2022   LDLDIRECT 81.3 08/19/2020   TRIG 81 08/01/2022   CHOLHDL 2 09/12/2010    Significant Diagnostic Results in last 30 days:  CUP PACEART REMOTE DEVICE CHECK  Result Date: 09/01/2022 Scheduled remote reviewed. Normal device function.  Next remote 91 days. LA   Assessment/Plan 1. Slurred speech - 2 pm slurred speech, drooling, upward right sided gaze - H/o right basal ganglia stroke 08/17/2020 - she is not on coumadin or Eliquis due to past GI bleed/ruptured Bakers cyst - also as statin intolerance - concerns for TIA/CVA - send to ED for further evaluation    Family/ staff Communication: plan discussed with patient, daughter and nurse  Labs/tests ordered:  none

## 2022-09-26 NOTE — ED Notes (Signed)
Right arm re-dressed and wrapped in light pressure dressing over all skin tear that started bleeding

## 2022-09-26 NOTE — ED Provider Notes (Signed)
[facility] EMERGENCY DEPARTMENT Provider Note   CSN: [id number] Arrival date & time: 09/26/22  1507     History  Chief Complaint  Patient presents with   Code Stroke    Susan Conway is a 88 y.o. female.  88 yo F with a chief complaints of acute stroke symptoms.  Patient with persistent eye deviation.  Arrived here as a code stroke.  Level 5 caveat acuity of condition.        Home Medications Prior to Admission medications   Medication Sig Start Date End Date Taking? Authorizing Provider  acetaminophen (TYLENOL) 325 MG tablet Take 650 mg by mouth 3 (three) times daily as needed.    [provider]  amLODipine (NORVASC) 5 MG tablet Take 5 mg by mouth daily.    [provider]  aspirin EC 81 MG EC tablet Take 1 tablet (81 mg total) by mouth daily. Swallow whole. 08/24/20   Oswald Hillock, MD  atenolol (TENORMIN) 50 MG tablet Take 50 mg by mouth 2 (two) times daily. HOLD FOR SPB <90 or HR< 60    [provider]  calcium-vitamin D (OSCAL WITH D) 500-200 MG-UNIT tablet Take 1 tablet by mouth daily.    [provider]  diclofenac Sodium (VOLTAREN) 1 % GEL Apply 4 g topically 4 (four) times daily as needed. Right knee, left ankle    [provider]  digoxin (LANOXIN) 0.125 MG tablet Take 0.125 mg by mouth every other day. Hold for apical pulse < 60 BPM    [provider]  docusate sodium (COLACE) 100 MG capsule Take 1 capsule (100 mg total) by mouth daily. 01/13/22   Royal Hawthorn, NP  hydrALAZINE (APRESOLINE) 10 MG tablet Take 10 mg by mouth daily as needed. Check resident's B/P first and only administer Hydralazine '10mg'$  BID PRN for SBP greater than 170.    [provider]  hydrochlorothiazide (HYDRODIURIL) 50 MG tablet Take 25 mg by mouth daily. Special Instructions: for hypertension HCTZ changed to 25 mg PO  Once a morning 8:00-11:00AM    [provider]  levocetirizine (XYZAL) 5 MG tablet Take  5 mg by mouth at bedtime.    [provider]  lidocaine (LIDODERM) 5 % Place 1 patch onto the skin as needed. Remove & Discard patch within 12 hours or as directed by MD    [provider]  LORazepam (ATIVAN) 1 MG tablet Take 1 tablet (1 mg total) by mouth at bedtime. 09/14/22   Royal Hawthorn, NP  losartan (COZAAR) 25 MG tablet Take 25 mg by mouth daily.    [provider]  ondansetron (ZOFRAN) 4 MG tablet Take 1 tablet (4 mg total) by mouth every 6 (six) hours as needed for nausea. 08/23/20   Oswald Hillock, MD  polyethylene glycol (MIRALAX / GLYCOLAX) 17 g packet Take 17 g by mouth daily. Mix in 4-6 oz of beverage of choice PO QD.  Once A Morning 8:00-11:00AM    [provider]  potassium chloride SA (KLOR-CON) 20 MEQ tablet Take 10 mEq by mouth daily.    [provider]      Allergies    Iodinated contrast media, Iodine, Lodine [etodolac], Other, Bactrim, Relafen [nabumetone], and Sulfanilamide    Review of Systems   Review of Systems  Physical Exam Updated Vital Signs BP (!) 175/80   Pulse 91   Temp (!) 97.5 F (36.4 C) (Oral)   Resp 17   Wt 59.6  kg   SpO2 92%   BMI 21.21 kg/m  Physical Exam Vitals and nursing note reviewed.  Constitutional:      General: She is not in acute distress.    Appearance: She is well-developed. She is not diaphoretic.  HENT:     Head: Normocephalic and atraumatic.  Eyes:     Pupils: Pupils are equal, round, and reactive to light.     Comments: Right upper gaze deviation  Cardiovascular:     Rate and Rhythm: Normal rate and regular rhythm.     Heart sounds: No murmur heard.    No friction rub. No gallop.  Pulmonary:     Effort: Pulmonary effort is normal.     Breath sounds: No wheezing or rales.  Abdominal:     General: There is no distension.     Palpations: Abdomen is soft.     Tenderness: There is no abdominal tenderness.  Musculoskeletal:        General: No tenderness.     Cervical back:  Normal range of motion and neck supple.  Skin:    General: Skin is warm and dry.  Neurological:     Mental Status: She is alert.  Psychiatric:        Behavior: Behavior normal.     ED Results / Procedures / Treatments   Labs (all labs ordered are listed, but only abnormal results are displayed) Labs Reviewed  I-STAT CHEM 8, ED - Abnormal; Notable for the following components:      Result Value   Sodium 134 (*)    BUN 34 (*)    Creatinine, Ser 1.40 (*)    Calcium, Ion 0.94 (*)    Hemoglobin 11.2 (*)    HCT 33.0 (*)    All other components within normal limits  PROTIME-INR  APTT  CBC  DIFFERENTIAL  COMPREHENSIVE METABOLIC PANEL  ETHANOL  CBG MONITORING, ED    EKG EKG Interpretation  Date/Time:  Tuesday September 26 2022 15:53:03 EST Ventricular Rate:  71 PR Interval:    QRS Duration: 143 QT Interval:  433 QTC Calculation: 471 R Axis:   -63 Text Interpretation: Atrial fibrillation RBBB and LAFB No significant change since last tracing Confirmed by Deno Etienne [phone]) on 09/26/2022 3:55:13 PM  Radiology CT HEAD CODE STROKE WO CONTRAST  Addendum Date: 09/26/2022   ADDENDUM REPORT: 09/26/2022 15:57 ADDENDUM: Impression #2 should state chronic infract in the periventricular white matter/right corona radiata Electronically Signed   By: Marin Roberts M.D.   On: 09/26/2022 15:57   Result Date: 09/26/2022 CLINICAL DATA:  Code stroke. EXAM: CT HEAD WITHOUT CONTRAST TECHNIQUE: Contiguous axial images were obtained from the base of the skull through the vertex without intravenous contrast. RADIATION DOSE REDUCTION: This exam was performed according to the departmental dose-optimization program which includes automated exposure control, adjustment of the mA and/or kV according to patient size and/or use of iterative reconstruction technique. COMPARISON:  08/17/20 CT brain, 08/19/20 MRI brain FINDINGS: Brain: No evidence of acute infarction, hemorrhage, hydrocephalus, extra-axial  collection or mass lesion/mass effect. Redemonstrated generalized volume loss with sequela of moderate to severe chronic microvascular ischemic change. Redemonstrated area of acute to subacute infarct in the periventricular white matter/right corona radiata (series 100, image 26). Vascular: No hyperdense vessel or unexpected calcification. Skull: Normal. Negative for fracture or focal lesion. Sinuses/Orbits: Bilateral lens replacements. Paranasal sinuses are clear. Other: None. ASPECTS ([place] Stroke Program Early CT Score): 10 IMPRESSION: 1. No hemorrhage or definitive CT evidence of a  new infarct. 2. Redemonstrated area of acute to subacute infarct in the periventricular white matter/right corona radiata. Findings were paged to Dr. Leonel Ramsay on 09/26/22 at 3:31 PM. Electronically Signed: By: Marin Roberts M.D. On: 09/26/2022 15:31    Procedures .Critical Care  Performed by: Deno Etienne, DO Authorized by: Deno Etienne, DO   Critical care provider statement:    Critical care time (minutes):  35   Critical care time was exclusive of:  Separately billable procedures and treating other patients   Critical care was time spent personally by me on the following activities:  Development of treatment plan with patient or surrogate, discussions with consultants, evaluation of patient's response to treatment, examination of patient, ordering and review of laboratory studies, ordering and review of radiographic studies, ordering and performing treatments and interventions, pulse oximetry, re-evaluation of patient's condition and review of old charts   Care discussed with: admitting provider       Medications Ordered in ED Medications  sodium chloride flush (NS) 0.9 % injection 3 mL (3 mLs Intravenous Given 09/26/22 1546)  iohexol (OMNIPAQUE) 350 MG/ML injection 75 mL (75 mLs Intravenous Contrast Given 09/26/22 1532)  tenecteplase (TNKASE) injection for Stroke 15 mg (15 mg Intravenous Given 09/26/22 1543)     ED Course/ Medical Decision Making/ A&P                           Medical Decision Making Amount and/or Complexity of Data Reviewed Labs: ordered. Radiology: ordered.   88 yo F with a chief complaints of acute slurred speech right-sided gaze deviation and arrived as a code stroke.  CT without intracranial hemorrhage on initial imaging.  Neuro to give TNK.  Neuro ICU admit.   The patients results and plan were reviewed and discussed.   Any x-rays performed were independently reviewed by myself.   Differential diagnosis were considered with the presenting HPI.  Medications  sodium chloride flush (NS) 0.9 % injection 3 mL (3 mLs Intravenous Given 09/26/22 1546)  iohexol (OMNIPAQUE) 350 MG/ML injection 75 mL (75 mLs Intravenous Contrast Given 09/26/22 1532)  tenecteplase (TNKASE) injection for Stroke 15 mg (15 mg Intravenous Given 09/26/22 1543)    Vitals:   09/26/22 1549 09/26/22 1550 09/26/22 1557 09/26/22 1557  BP:  (!) 175/80    Pulse: (!) 101 (!) 116 91   Resp: '20 16 17   '$ Temp:    (!) 97.5 F (36.4 C)  TempSrc:    Oral  SpO2: 97% 93% 92%   Weight:        Final diagnoses:  Acute ischemic stroke [facility])    Admission/ observation were discussed with the admitting physician, patient and/or family and they are comfortable with the plan.          Final Clinical Impression(s) / ED Diagnoses Final diagnoses:  Acute ischemic stroke [facility])    Rx / DC Orders ED Discharge Orders     None         Deno Etienne, DO 09/26/22 1600

## 2022-09-27 ENCOUNTER — Inpatient Hospital Stay (HOSPITAL_COMMUNITY): Payer: Medicare Other

## 2022-09-27 ENCOUNTER — Encounter (HOSPITAL_COMMUNITY): Payer: Self-pay | Admitting: Neurology

## 2022-09-27 DIAGNOSIS — I6389 Other cerebral infarction: Secondary | ICD-10-CM

## 2022-09-27 DIAGNOSIS — I63511 Cerebral infarction due to unspecified occlusion or stenosis of right middle cerebral artery: Secondary | ICD-10-CM

## 2022-09-27 LAB — ECHOCARDIOGRAM COMPLETE
Height: 66 in
S' Lateral: 2.2 cm
Weight: 2102.31 oz

## 2022-09-27 LAB — LIPID PANEL
Cholesterol: 161 mg/dL (ref 0–200)
HDL: 39 mg/dL — ABNORMAL LOW (ref >40)
LDL Cholesterol: 114 mg/dL — ABNORMAL HIGH (ref 0–99)
Total CHOL/HDL Ratio: 4.1 RATIO
Triglycerides: 39 mg/dL (ref <150)
VLDL: 8 mg/dL (ref 0–40)

## 2022-09-27 LAB — MRSA NEXT GEN BY PCR, NASAL: MRSA by PCR Next Gen: NOT DETECTED

## 2022-09-27 MED ORDER — ASPIRIN 81 MG PO CHEW
81.0000 mg | CHEWABLE_TABLET | Freq: Every day | ORAL | Status: DC
  Administered 2022-09-27 – 2022-10-02 (×6): 81 mg via ORAL
  Filled 2022-09-27 (×6): qty 1

## 2022-09-27 MED ORDER — HYDRALAZINE HCL 20 MG/ML IJ SOLN
10.0000 mg | Freq: Four times a day (QID) | INTRAMUSCULAR | Status: DC | PRN
  Administered 2022-09-28 – 2022-10-02 (×4): 10 mg via INTRAVENOUS
  Filled 2022-09-27 (×4): qty 1

## 2022-09-27 MED ORDER — HYDRALAZINE HCL 20 MG/ML IJ SOLN: 10.0000 mg | Freq: Four times a day (QID) | INTRAMUSCULAR | Status: DC | PRN

## 2022-09-27 NOTE — Evaluation (Signed)
Physical Therapy Evaluation Patient Details Name: Susan Conway MRN: [id number] DOB: 23-Feb-1929 Today's Date: 09/27/2022  History of Present Illness  88 y.o. female presents to [facility] hospital on 09/26/2022 with dysarthria, L facial droop, R gaze. CTA revealed multifocal occlusions, pt received TNK. PMH includes R basal ganglia CVA, L knee bakers cyst, afib, MI, pacemaker, anxiety.  Clinical Impression  Pt presents to PT with deficits in strength, ROM, cognition, balance, functional mobility. Pt is initially lethargic but becomes more alert with mobility. Pt with chronic L hemiplegia, but with increased LLE weakness and now with L neglect and R gaze preference. Pt is at a high risk for falls and dependent on 2 person assist to mobilize in bed currently due to weakness and impaired balance. PT recommends use of hoyer lift for out of bed mobility at this time. PT recommends return to SNF with PT services when medically appropriate for discharge.     Recommendations for follow up therapy are one component of a multi-disciplinary discharge planning process, led by the attending physician.  Recommendations may be updated based on patient status, additional functional criteria and insurance authorization.  Follow Up Recommendations Skilled nursing-short term rehab (<3 hours/day) Can patient physically be transported by private vehicle: No    Assistance Recommended at Discharge Frequent or constant Supervision/Assistance  Patient can return home with the following  Two people to help with walking and/or transfers;Two people to help with bathing/dressing/bathroom;Assistance with cooking/housework;Direct supervision/assist for financial management;Assist for transportation;Help with stairs or ramp for entrance;Assistance with feeding;Direct supervision/assist for medications management    Equipment Recommendations None recommended by PT (SNF has needed DME)  Recommendations for Other Services        Functional Status Assessment Patient has had a recent decline in their functional status and demonstrates the ability to make significant improvements in function in a reasonable and predictable amount of time.     Precautions / Restrictions Precautions Precautions: Fall Precaution Comments: L neglect Restrictions Weight Bearing Restrictions: No      Mobility  Bed Mobility Overal bed mobility: Needs Assistance Bed Mobility: Supine to Sit, Sit to Supine     Supine to sit: Total assist, +2 for physical assistance, HOB elevated Sit to supine: Total assist, +2 for physical assistance        Transfers                        Ambulation/Gait                  Stairs            Wheelchair Mobility    Modified Rankin (Stroke Patients Only)       Balance Overall balance assessment: Needs assistance Sitting-balance support: Single extremity supported, Feet unsupported, Bilateral upper extremity supported Sitting balance-Leahy Scale: Poor Sitting balance - Comments: maxA, posterior lean Postural control: Posterior lean                                   Pertinent Vitals/Pain Pain Assessment Pain Assessment: No/denies pain    Home Living Family/patient expects to be discharged to:: Skilled nursing facility     Type of Home: Center                  Prior Function Prior Level of Function : Needs assist  Mobility Comments: pt transfers with use of similar equipment to  sara+, mobilizes on power scooter, does ambulate with PT and use of quad cane       Hand Dominance        Extremity/Trunk Assessment   Upper Extremity Assessment Upper Extremity Assessment: Defer to OT evaluation    Lower Extremity Assessment Lower Extremity Assessment: RLE deficits/detail;LLE deficits/detail RLE Deficits / Details: grossly 3+/5, ROM WFL LLE Deficits / Details: pt is able to wiggle toes, no active  knee extension noted in supine or sitting, knee ROM WFL passively, ankle DF to neutral passively LLE Sensation: decreased light touch    Cervical / Trunk Assessment Cervical / Trunk Assessment: Kyphotic;Other exceptions (R gaze preference, cervical rotation ~30 degrees to left with assistance)  Communication   Communication: Expressive difficulties (dysarthria)  Cognition Arousal/Alertness: Awake/alert, Lethargic (lethargic initially, becomes more alert with mobility) Behavior During Therapy: Flat affect Overall Cognitive Status: Difficult to assess Area of Impairment: Orientation, Attention, Following commands, Safety/judgement, Awareness, Problem solving                 Orientation Level: Disoriented to, Time Current Attention Level: Focused   Following Commands: Follows one step commands inconsistently, Follows one step commands with increased time Safety/Judgement: Decreased awareness of safety, Decreased awareness of deficits Awareness: Intellectual Problem Solving: Slow processing, Decreased initiation, Requires verbal cues          General Comments General comments (skin integrity, edema, etc.): VSS on RA    Exercises     Assessment/Plan    PT Assessment Patient needs continued PT services  PT Problem List Decreased strength;Decreased range of motion;Decreased activity tolerance;Decreased balance;Decreased mobility;Decreased coordination;Decreased cognition;Decreased knowledge of use of DME;Decreased safety awareness;Decreased knowledge of precautions;Impaired sensation       PT Treatment Interventions DME instruction;Functional mobility training;Therapeutic activities;Therapeutic exercise;Balance training;Neuromuscular re-education;Patient/family education;Wheelchair mobility training;Cognitive remediation;Gait training    PT Goals (Current goals can be found in the Care Plan section)  Acute Rehab PT Goals Patient Stated Goal: return toward baseline and reduce  caregiver burden PT Goal Formulation: With family Time For Goal Achievement: 10/11/22 Potential to Achieve Goals: Fair    Frequency Min 3X/week     Co-evaluation PT/OT/SLP Co-Evaluation/Treatment: Yes Reason for Co-Treatment: Complexity of the patient's impairments (multi-system involvement);Necessary to address cognition/behavior during functional activity;For patient/therapist safety PT goals addressed during session: Mobility/safety with mobility;Balance;Strengthening/ROM         AM-PAC PT "6 Clicks" Mobility  Outcome Measure Help needed turning from your back to your side while in a flat bed without using bedrails?: Total Help needed moving from lying on your back to sitting on the side of a flat bed without using bedrails?: Total Help needed moving to and from a bed to a chair (including a wheelchair)?: Total Help needed standing up from a chair using your arms (e.g., wheelchair or bedside chair)?: Total Help needed to walk in hospital room?: Total Help needed climbing 3-5 steps with a railing? : Total 6 Click Score: 6    End of Session   Activity Tolerance: Patient tolerated treatment well Patient left: in bed;with call bell/phone within reach;with bed alarm set;with family/visitor present Nurse Communication: Mobility status;Need for lift equipment PT Visit Diagnosis: Other abnormalities of gait and mobility (R26.89);Muscle weakness (generalized) (M62.81);Other symptoms and signs involving the nervous system (R29.898);Hemiplegia and hemiparesis Hemiplegia - Right/Left: Left Hemiplegia - dominant/non-dominant: Non-dominant Hemiplegia - caused by:  (prior CVA)    Time: 3557-3220 PT Time Calculation (min) (ACUTE ONLY): 33  min   Charges:   PT Evaluation $PT Eval Moderate Complexity: 1 Mod          Zenaida Niece, PT, DPT Acute Rehabilitation Office [place] 09/27/2022, 1:06 PM

## 2022-09-27 NOTE — Progress Notes (Signed)
[facility] for brain wo contrast. Patient has St.Jude device. Bryan-rep to the bedside to perform device check and program patient for MRI. Orders for VOO 90. Will re-program once scan is completed.

## 2022-09-27 NOTE — Progress Notes (Addendum)
STROKE TEAM PROGRESS NOTE   INTERVAL HISTORY Her daughter is at the bedside.  MRI today. SLP and PT/OT pending as well.  She was previously on [facility] but was taken off do to a large bleeding cyst. Sees Dr. Lovena Le outpatient, last seen 10/10/2021 where he did recommend anticoagulation, but family was hesitant  CHADS2VASC Score 7 (HTN, Age, prev Stroke, gender, vascular disease) stroke risk is 11.2%  Vitals:   09/27/22 0630 09/27/22 0645 09/27/22 0700 09/27/22 0730  BP: (!) 141/69 (!) 154/68 126/81 (!) 159/56  Pulse: 69 72 68 69  Resp: '20 12 14 '$ (!) 21  Temp:      TempSrc:      SpO2: 99% 93% 98% 99%  Weight:       CBC:  Recent Labs  Lab 09/26/22 1517 09/26/22 1539  WBC  --  8.2  NEUTROABS  --  4.2  HGB 11.2* 10.7*  HCT 33.0* 33.5*  MCV  --  87.7  PLT  --  119   Basic Metabolic Panel:  Recent Labs  Lab 09/26/22 1517 09/26/22 1539  NA 134* 135  K 4.2 4.2  CL 104 94*  CO2  --  29  GLUCOSE 89 79  BUN 34* 33*  CREATININE 1.40* 1.40*  CALCIUM  --  9.2   Lipid Panel:  Recent Labs  Lab 09/27/22 0546  CHOL 161  TRIG 39  HDL 39*  CHOLHDL 4.1  VLDL 8  LDLCALC 114*   HgbA1c:  Recent Labs  Lab 09/26/22 1620  HGBA1C 5.7*   Urine Drug Screen: No results for input(s): "LABOPIA", "COCAINSCRNUR", "LABBENZ", "AMPHETMU", "THCU", "LABBARB" in the last 168 hours.  Alcohol Level  Recent Labs  Lab 09/26/22 1511  ETH <10    IMAGING past 24 hours CT ANGIO HEAD NECK W WO CM (CODE STROKE)  Result Date: 09/26/2022 CLINICAL DATA:  Code stroke EXAM: CT ANGIOGRAPHY HEAD AND NECK TECHNIQUE: Multidetector CT imaging of the head and neck was performed using the standard protocol during bolus administration of intravenous contrast. Multiplanar CT image reconstructions and MIPs were obtained to evaluate the vascular anatomy. Carotid stenosis measurements (when applicable) are obtained utilizing NASCET criteria, using the distal internal carotid diameter as the denominator. RADIATION DOSE  REDUCTION: This exam was performed according to the departmental dose-optimization program which includes automated exposure control, adjustment of the mA and/or kV according to patient size and/or use of iterative reconstruction technique. CONTRAST:  79m OMNIPAQUE IOHEXOL 350 MG/ML SOLN COMPARISON:  Same-day noncontrast CT head, MR head and MRA head/neck 08/19/2020 FINDINGS: CTA NECK FINDINGS Aortic arch: There is calcified plaque in the imaged aortic arch. 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 The origins of the major branch vessels are patent with scattered soft and calcified plaque but no high-grade stenosis. Right carotid system: The right common carotid artery is patent but poorly opacified due to the downstream occlusion. The right internal carotid artery is occluded throughout its course. The external carotid artery is patent but poorly opacified. Left carotid system: The left common carotid artery is patent. There is calcified plaque in the proximal internal carotid artery resulting in up to approximately 50% stenosis. The distal internal carotid artery is patent but tortuous in the neck. The external carotid artery is patent. There is no dissection or aneurysm. Vertebral arteries:  There is severe stenosis of the origin of the right vertebral artery and calcified plaque in the V1 segment resulting in mild stenosis. There is additional calcified plaque in the V3 segment resulting in mild-to-moderate stenosis. The left vertebral artery is patent without hemodynamically significant stenosis or occlusion in the neck. There is no evidence of dissection or aneurysm. Skeleton: There is multilevel degenerative change of the cervical spine. There is no acute osseous abnormality or suspicious osseous lesion. There  is no visible canal hematoma. Other neck: The soft tissues are unremarkable. Upper chest: There is scarring in the lung apices. Review of the MIP images confirms the above findings CTA HEAD FINDINGS Anterior circulation: The right ICA is occluded throughout its course with reconstitution of flow at the M1 segment. There is short-segment occlusion of a right M2 branch proximally (8-108, 7-91). There is additional focal severe stenosis of an inferior right M2 segment proximally (7-96). There is atherosclerotic irregularity and narrowing in the remainder of the right MCA branches. There is calcified plaque in the left intracranial ICA resulting in up to mild-to-moderate stenosis of the supraclinoid segment. There is mild stenosis of the left M1 segment. The distal left MCA branches appear patent without proximal high-grade stenosis or occlusion. The bilateral ACAs are patent with focal severe stenosis of the left A2 segment (7-67). There is no other proximal high-grade stenosis or occlusion. There is no aneurysm or AVM. Posterior circulation: There is plaque in the right V4 segment resulting in severe stenosis proximally. There is critical stenosis/occlusion of the left vertebral artery at the V3/V4 junction with diminutive caliber of the remainder of the V4 segment. The basilar artery is patent with mild irregularity. The major cerebellar arteries appear patent. There is a fetal origin of the left PCA the left PCA appears patent, with mild-to-moderate stenosis of the proximal P2 segment (100-152). The right PCA is not identified proximally there is reconstitution of flow in the P2 segment (100-161). Note that the right PCA is also fetal in origin and primarily supplied by a now occluded posterior communicating artery. Venous sinuses: Suboptimally evaluated due to bolus timing. Anatomic variants: As above. Review of the MIP images confirms the above findings IMPRESSION: 1. Occlusion of the right internal carotid artery  throughout its course in the neck with reconstitution of flow at the M1 segment, age indeterminate but new since 2021. 2. Short-segment occlusion of a right M2 branch proximally and additional focal severe stenosis of an inferior right M2 segment proximally. 3. Occlusion of the right PCA proximally with reconstitution of flow in the P2 segment. Note that the right PCA is also fetal in origin and primarily supplied by a now occluded posterior communicating artery. 4. Additional intracranial atherosclerotic disease resulting in severe stenosis of the left A2 segment, severe stenosis of the right V4 segment, and critical stenosis/occlusion of the left vertebral artery at the V3/V4 junction with diminutive caliber of the remainder of the V4 segment. 5. Calcified plaque in the left carotid bifurcation resulting in approximately 50% stenosis. 6. Severe stenosis at the origin of the right vertebral artery. 7. 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 Recommend vascular surgery referral. Findings were communicated with Dr. Leonel Ramsay at 3:55 p.m. Electronically Signed   By: Valetta Mole M.D.   On: 09/26/2022 16:02   CT HEAD CODE STROKE WO CONTRAST  Addendum Date: 09/26/2022   ADDENDUM REPORT: 09/26/2022  15:57 ADDENDUM: Impression #2 should state chronic infract in the periventricular white matter/right corona radiata Electronically Signed   By: Marin Roberts M.D.   On: 09/26/2022 15:57   Result Date: 09/26/2022 CLINICAL DATA:  Code stroke. EXAM: CT HEAD WITHOUT CONTRAST TECHNIQUE: Contiguous axial images were obtained from the base of the skull through the vertex without intravenous contrast. RADIATION DOSE REDUCTION: This exam was performed according to the departmental dose-optimization program which includes automated exposure control, adjustment of the mA and/or kV according to  patient size and/or use of iterative reconstruction technique. COMPARISON:  08/17/20 CT brain, 08/19/20 MRI brain FINDINGS: Brain: No evidence of acute infarction, hemorrhage, hydrocephalus, extra-axial collection or mass lesion/mass effect. Redemonstrated generalized volume loss with sequela of moderate to severe chronic microvascular ischemic change. Redemonstrated area of acute to subacute infarct in the periventricular white matter/right corona radiata (series 100, image 26). Vascular: No hyperdense vessel or unexpected calcification. Skull: Normal. Negative for fracture or focal lesion. Sinuses/Orbits: Bilateral lens replacements. Paranasal sinuses are clear. Other: None. ASPECTS ([place] Stroke Program Early CT Score): 10 IMPRESSION: 1. No hemorrhage or definitive CT evidence of a new infarct. 2. Redemonstrated area of acute to subacute infarct in the periventricular white matter/right corona radiata. Findings were paged to Dr. Leonel Ramsay on 09/26/22 at 3:31 PM. Electronically Signed: By: Marin Roberts M.D. On: 09/26/2022 15:31    PHYSICAL EXAM GENERAL: Awake, alert, anosognosia obvious HEENT: - Normocephalic and atraumatic, dry mm, no lymphadenopathy, no Thyromegally LUNGS - Clear to auscultation bilaterally CV - S1S2 RRR, equal pulses bilaterally. ABDOMEN - Soft, nontender, nondistended with normoactive BS Ext: warm, well perfused, intact peripheral pulses, no pedal edema   NEURO:  Mental Status: AA&Ox1 to person Language: speech is dysarthric.  In tact naming, repetition, mild loss of fluency, and comprehension. Cranial Nerves:  II: PERRL. Visual fields full to confrontation.  III, IV, VI: Right gaze preference. Eyelids elevate symmetrically.  Does not blink to threat on left V: Sensation intact V1-3 symmetrically  VII:  left facial asymmetry   VIII: hearing intact to voice with use of hearing aides  IX, X: Palate elevates symmetrically.  HB:ZJIRCVEL shoulder shrug at baseline on left   XII: tongue is midline without fasciculations. Motor:  RUE: At least 4/5 throughout LUE: 0/5, baseline from prior stroke RLE: At least 3/5 throughout     LLE: 1/5 distally and 2/5 hip flexor Tone: is normal and bulk is normal DTRs: Perhaps slightly brisk on left but 2+ right hemibody Sensation- Intact to light touch throughout Coordination: FTN intact on right, no ataxia in BLE., no abnormal movements  Gait- deferred  ASSESSMENT/PLAN [name] is a 88 y.o. female with history of atrial fibrillation not on anticoagulation due to a previous bleeding left knee Bakers cyst but maintained on aspirin, pacemaker placement, right basal ganglia ischemic infarction in 2021 with resultant left upper extremity weakness and spasticity resulting in contracture as well as left lower extremity weakness. She is presented with dysarthria, left facial droop and a right gaze preference. She is from [facility]   Stroke:  Right hemisphere infarct s/p TNK Etiology:  likely embolic given afib not on anticoagulation Code Stroke CT head  1. No hemorrhage or definitive CT evidence of a new infarct. 2. Redemonstrated area of acute to subacute infarct in the periventricular white matter/right corona radiata.  CTA head & neck  1. Occlusion of the right internal carotid artery throughout its course in the neck with reconstitution of flow at  the M1 segment, age indeterminate but new since 2021. 2. Short-segment occlusion of a right M2 branch proximally and additional focal severe stenosis of an inferior right M2 segment proximally. 3. Occlusion of the right PCA proximally with reconstitution of flow in the P2 segment. Note that the right PCA is also fetal in origin and primarily supplied by a now occluded posterior communicating artery. 4. Additional intracranial atherosclerotic disease resulting in severe stenosis of the left A2 segment, severe stenosis of the right V4 segment, and critical stenosis/occlusion  of the left vertebral artery at the V3/V4 junction with diminutive caliber of the remainder of the V4 segment. 5. Calcified plaque in the left carotid bifurcation resulting in approximately 50% stenosis. 6. Severe stenosis at the origin of the right vertebral artery. 7. 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 Recommend vascular surgery referral. MRI  Unable to tolerate CT Head Repeat-  Large acute right PCA territory infarct. Hyperdense right PCA, compatible with known occlusion. 2D Echo Pending LDL 114 HgbA1c 5.7 VTE prophylaxis -     Diet   Diet NPO time specified   aspirin 81 mg daily prior to admission, now on No antithrombotic. No antithrombic for 24 hours post TNK Therapy recommendations:  SNF Disposition:  pending  Hypertension Home meds:  Norvasc 5, atenolol 50, hydralazine, hydrochlorothiazide, losartan Stable BP systolic 203 PRN cardene Long-term BP goal normotensive  Atrial fibrillation Not on OAC Rate control with atenolol and digoxin  Hyperlipidemia Home meds:  None, resumed in hospital LDL 114, goal < 70 Add atorvastatin Continue statin at discharge   Other Stroke Risk Factors Advanced Age >/= 6  Hx stroke/TIA Coronary artery disease  Other Active Problems   Hospital day # 1  Patient seen and examined by NP/APP with MD. MD to update note as needed.   Janine Ores, DNP, FNP-BC [facility] Pager: [phone]  ATTENDING ATTESTATION:  88 year old with large right MCA stroke status post TNK not a candidate for thrombectomy due to high risk.  She has severe atherosclerotic disease in bilateral vessels on CTA as well as a right ICA occlusion.  History of atrial fibrillation but off anticoagulation due to his bleeding Baker's cyst.  They wish to remain off anticoagulation.  24-hour post TNK imaging shows no  bleed.  Aspirin 81 mg to be started for stroke prevention.  Continue statin.  Continue therapy.  Transition of the ICU tomorrow.  Exam as above.  Dr. Reeves Forth evaluated pt independently, reviewed imaging, chart, labs. Discussed and formulated plan with the Resident/APP. Changes were made to the note where appropriate. Please see APP/resident note above for details.      This patient is critically ill due to acute large stroke s/p tPA and at significant risk of neurological worsening, death form heart failure, respiratory failure, recurrent stroke, bleeding from [facility], seizure, sepsis. This patient's care requires constant monitoring of vital signs, hemodynamics, respiratory and cardiac monitoring, review of multiple databases, neurological assessment, discussion with family, other specialists and medical decision making of high complexity. I spent 35 minutes of neurocritical care time in the care of this patient.   Gianelle Mccaul,MD    To contact Stroke Continuity provider, please refer to [email or web address] After hours, contact General Neurology

## 2022-09-27 NOTE — Evaluation (Signed)
Clinical/Bedside Swallow Evaluation Patient Details  Name: Susan Conway MRN: [id number] Date of Birth: [birth year]  Today's Date: 09/27/2022 Time: SLP Start Time (ACUTE ONLY): 1012 SLP Stop Time (ACUTE ONLY): 1034 SLP Time Calculation (min) (ACUTE ONLY): 22 min  Past Medical History:  Past Medical History:  Diagnosis Date   Actinic keratosis 05/25/2014   Anxiety    Atrial fibrillation ([place]) 09/21/2010   Basal cell carcinoma 05/25/2014   Multiple removed by Dr. Danella Sensing in March 2015: right neck, left neck, scalp    Bell's palsy 07/18/1979   Cervicalgia 01/31/2012   Closed fracture of lumbar vertebra without mention of spinal cord injury 07/17/2005   Conjunctiva disorder 12/26/2010   Coronary atherosclerosis of native coronary artery 07/17/1997   Cramp of limb 08/16/2009   Degeneration of lumbar or lumbosacral intervertebral disc 01/31/2012   Disturbance of skin sensation 08/2009   Dizziness and giddiness 11/08/2011   vertigo   External hemorrhoids without mention of complication 48/18/5631   External hemorrhoids without mention of complication 49/70/2637   Ganglion of tendon sheath 08/16/2009   Leg cramp 10/27/2013   Most frequently the right leg.    Long term (current) use of anticoagulants 09/2010   Lumbago 07/2009   Meralgia paresthetica 07/2007   MI, old    Myalgia and myositis, unspecified 01/17/2012   Osteoporosis    Other abnormal blood chemistry 04/08/2012   Other abnormal blood chemistry 2013   hyperglycemia   Other disorder of muscle, ligament, and fascia 04/08/2012   Other specified cardiac dysrhythmias(427.89) 06/27/2010   Pacemaker 05/06/2020   Pain in joint, ankle and foot 10/27/2013   Bilateral since 1998    Pain in joint, shoulder region 08/12/2012   Pain in joint, upper arm 12/26/2010   Pain in limb 01/11/2011   Pain in thoracic spine 01/31/2012   Pathologic fracture of vertebrae 01/22/2012   PVC's (premature ventricular contractions)    Rash and  other nonspecific skin eruption 08/02/2011   Senile osteoporosis 07/17/1993   Stroke ([place])    Unspecified essential hypertension 07/17/1997   Varicose veins of lower extremities 08/16/2009   Varicose veins of lower extremities 08/16/2009   Past Surgical History:  Past Surgical History:  Procedure Laterality Date   COLONOSCOPY WITH PROPOFOL N/A 10/19/2015   Procedure: COLONOSCOPY WITH PROPOFOL;  Surgeon: Gatha Mayer, MD;  Location: [facility];  Service: Endoscopy;  Laterality: N/A;   CORONARY ARTERY BYPASS GRAFT  1998   x2; LIMA to LAD; SVG to diagonal off bypass   HEMORRHOID SURGERY  08/26/2012   Dr. Brantley Stage   LOOP RECORDER INSERTION N/A 05/13/2018   Procedure: LOOP RECORDER INSERTION;  Surgeon: [name], MD;  Location: [facility];  Service: Cardiovascular;  Laterality: N/A;   LOOP RECORDER REMOVAL N/A 05/26/2019   Procedure: LOOP RECORDER REMOVAL;  Surgeon: [name], MD;  Location: [facility];  Service: Cardiovascular;  Laterality: N/A;   MASS EXCISION Left 11/23/2015   Procedure: LEFT WRIST EXCISION CYST;  Surgeon: Leanora Cover, MD;  Location: [facility];  Service: Orthopedics;  Laterality: Left;   PACEMAKER IMPLANT N/A 05/26/2019   Procedure: PACEMAKER IMPLANT;  Surgeon: [name], MD;  Location: [facility];  Service: Cardiovascular;  Laterality: N/A;   SKIN BIOPSY  01/29/14   (R) neck; (R) scalp, 2 (L) neck; shave biopsy Superficial basal cell carcinoma Dr. Danella Sensing   TONSILLECTOMY  [phone]   HPI:  Pt is a 88 year old female who  presented with dysarthria, left facial droop, and right daze preference. TNK given.CT head negative for acute changes. CTA neck 11/21: Occlusion of the right internal carotid artery, ccclusion of the right PCA proximally with reconstitution of flow in the P2 segment, short-segment occlusion of a right M2 branch proximally and additional focal severe stenosis of an inferior right M2 segment   proximally. PMH: atrial fibrillation not on anticoagulation due to a previous bleeding left knee Bakers cyst but maintained on aspirin, right basal ganglia ischemic infarction in 2021 with resultant left upper extremity weakness and spasticity resulting in contracture as well as left lower extremity weakness. Pt seen for dysphagia in 2021 and was discharged on dysphagia 3 and thin liquids. Per pt's daughter, the pt subsequently received SLP services and was advanced to regular and thin liquids.    Assessment / Plan / Recommendation  Clinical Impression  Pt was seen for bedside swallow evaluation with her daughter present. Per the daughter, the pt received dysphagia treatment in 2021 and has been on a regular texture diet with thin liquids and asymptomatic since intervention. Oral mechanism exam was significant for left-sided facial weakness and reduced lingual strength and ROM. Dentition was adequate, and natural. Pt presented with symptoms of oropharyngeal dysphagia characterized by prolonged mastication, left-sided anterior spillage, reduced labial stripping, oral residue, and signs of aspiration with thin liquids. Pt's NPO status will be continued, but pt's daughter was advised that she may give the pt the remainder of the applesauce that was left in the pt's room. A modified barium swallow study is recommended to further assess swallow function; the timing of this will be coordinated with radiology. SLP Visit Diagnosis: Dysphagia, unspecified (R13.10)    Aspiration Risk  Mild aspiration risk    Diet Recommendation NPO (diet pending MBS)   Medication Administration:  (critical meds may be crushed and given in puree)    Other  Recommendations Oral Care Recommendations: Oral care BID    Recommendations for follow up therapy are one component of a multi-disciplinary discharge planning process, led by the attending physician.  Recommendations may be updated based on patient status, additional  functional criteria and insurance authorization.  Follow up Recommendations  (Continued SLP services at level of care recommended by PT/OT)      Assistance Recommended at Discharge    Functional Status Assessment Patient has had a recent decline in their functional status and demonstrates the ability to make significant improvements in function in a reasonable and predictable amount of time.  Frequency and Duration min 2x/week  2 weeks       Prognosis Prognosis for Safe Diet Advancement: Good Barriers to Reach Goals: Cognitive deficits;Severity of deficits      Swallow Study   General Date of Onset: 09/26/22 HPI: Pt is a 88 year old female who presented with dysarthria, left facial droop, and right daze preference. TNK given.CT head negative for acute changes. CTA neck 11/21: Occlusion of the right internal carotid artery, ccclusion of the right PCA proximally with reconstitution of flow in the P2 segment, short-segment occlusion of a right M2 branch proximally and additional focal severe stenosis of an inferior right M2 segment  proximally. PMH: atrial fibrillation not on anticoagulation due to a previous bleeding left knee Bakers cyst but maintained on aspirin, right basal ganglia ischemic infarction in 2021 with resultant left upper extremity weakness and spasticity resulting in contracture as well as left lower extremity weakness. Pt seen for dysphagia in 2021 and was discharged on dysphagia 3 and thin  liquids. Per pt's daughter, the pt subsequently received SLP services and was advanced to regular and thin liquids. Type of Study: Bedside Swallow Evaluation Previous Swallow Assessment: See HPI Diet Prior to this Study: NPO Temperature Spikes Noted: No Respiratory Status: Room air History of Recent Intubation: No Behavior/Cognition: Alert;Cooperative;Pleasant mood Oral Cavity Assessment: Within Functional Limits Oral Care Completed by SLP: No Vision: Functional for  self-feeding Self-Feeding Abilities: Needs assist Patient Positioning: Upright in bed;Postural control adequate for testing Volitional Cough: Strong Volitional Swallow: Able to elicit    Oral/Motor/Sensory Function Overall Oral Motor/Sensory Function: Mild impairment Facial ROM: Reduced left;Suspected CN VII (facial) dysfunction Facial Symmetry: Abnormal symmetry left;Suspected CN VII (facial) dysfunction Facial Strength: Reduced left;Suspected CN VII (facial) dysfunction Lingual ROM: Reduced left;Suspected CN XII (hypoglossal) dysfunction;Reduced right Lingual Strength: Reduced;Suspected CN XII (hypoglossal) dysfunction   Ice Chips Ice chips: Impaired Presentation: Spoon Oral Phase Functional Implications: Prolonged oral transit   Thin Liquid Thin Liquid: Impaired Presentation: Cup;Straw Oral Phase Impairments: Reduced labial seal Oral Phase Functional Implications: Left anterior spillage;Prolonged oral transit    Nectar Thick Nectar Thick Liquid: Not tested   Honey Thick Honey Thick Liquid: Not tested   Puree Puree: Impaired Presentation: Spoon Oral Phase Impairments: Reduced labial seal Oral Phase Functional Implications: Left anterior spillage;Prolonged oral transit   Solid     Solid: Impaired Presentation: Self Fed Oral Phase Impairments: Impaired mastication Oral Phase Functional Implications: Impaired mastication     Adrion Menz I. Hardin Negus, [place], [place] Office number [phone]  Horton Marshall 09/27/2022,11:14 AM

## 2022-09-27 NOTE — Progress Notes (Signed)
CSW spoke with Santiago Glad, Education officer, museum at [employer]. She confirmed patient is from their SNF side and can return when stable. For any needs on Thursday or the weekend, please contact their RN Supervisor at [phone]. Santiago Glad will be working on Friday if needed [phone]).  Gilmore Laroche, MSW, [facility]

## 2022-09-27 NOTE — Evaluation (Signed)
Occupational Therapy Evaluation Patient Details Name: Susan Conway MRN: [id number] DOB: 01-13-1929 Today's Date: 09/27/2022   History of Present Illness 88 y.o. female presents to [facility] hospital on 09/26/2022 with dysarthria, L facial droop, R gaze. CTA revealed multifocal occlusions, pt received TNK. PMH includes R basal ganglia CVA, L knee bakers cyst, afib, MI, pacemaker, anxiety.   Clinical Impression   PT admitted with CVA s/p TNK. Pt currently with functional limitiations due to the deficits listed below (see OT problem list). Pt currently with L hemiplegia and R gaze preference. Pt at baseline normally is up in electric wheelchair around facility and using a sara plus to complete sit<>Stand to chair. Pt at this time will require (A) for all meals and all adls.  Pt will benefit from skilled OT to increase their independence and safety with adls and balance to allow discharge SNF.       Recommendations for follow up therapy are one component of a multi-disciplinary discharge planning process, led by the attending physician.  Recommendations may be updated based on patient status, additional functional criteria and insurance authorization.   Follow Up Recommendations  Skilled nursing-short term rehab (<3 hours/day)     Assistance Recommended at Discharge Intermittent Supervision/Assistance  Patient can return home with the following Two people to help with walking and/or transfers;Two people to help with bathing/dressing/bathroom    Functional Status Assessment  Patient has had a recent decline in their functional status and demonstrates the ability to make significant improvements in function in a reasonable and predictable amount of time.  Equipment Recommendations  Wheelchair cushion (measurements OT);Wheelchair (measurements OT);Hospital bed (hoyer lift)    Recommendations for Other Services       Precautions / Restrictions Precautions Precautions: Fall Precaution Comments: L  neglect Restrictions Weight Bearing Restrictions: No      Mobility Bed Mobility Overal bed mobility: Needs Assistance Bed Mobility: Supine to Sit, Sit to Supine     Supine to sit: Total assist, +2 for physical assistance, HOB elevated Sit to supine: Total assist, +2 for physical assistance        Transfers                   General transfer comment: defer to next session      Balance Overall balance assessment: Needs assistance Sitting-balance support: Single extremity supported, Feet unsupported, Bilateral upper extremity supported Sitting balance-Leahy Scale: Poor Sitting balance - Comments: maxA, posterior lean Postural control: Posterior lean                                 ADL either performed or assessed with clinical judgement   ADL Overall ADL's : Needs assistance/impaired Eating/Feeding: Maximal assistance;Sitting Eating/Feeding Details (indicate cue type and reason): hand over hand to take sip of thicken nectar cup from SLP session Grooming: Set up;Bed level Grooming Details (indicate cue type and reason): wiping face with tissue. pt grabbing any cloth item and wiping face                               General ADL Comments: pt dangled eob this session. daughter present and reports pt very fatigued from admission     Vision Baseline Vision/History: 1 Wears glasses Additional Comments: glasses on present. pt with strong R gaze preference.     Perception     Praxis  Pertinent Vitals/Pain Pain Assessment Pain Assessment: No/denies pain     Hand Dominance     Extremity/Trunk Assessment Upper Extremity Assessment Upper Extremity Assessment: LUE deficits/detail LUE Deficits / Details: activation noted at the hand and elbow with assistance. pt states "i can't very well now" pt showing activation when in visual field. decreased sensation   Lower Extremity Assessment Lower Extremity Assessment: Defer to PT  evaluation   Cervical / Trunk Assessment Cervical / Trunk Assessment: Kyphotic;Other exceptions (R gaze preference, cervical rotation ~30 degrees to left with assistance)   Communication Communication Communication: Expressive difficulties (dysarthria)   Cognition Arousal/Alertness: Awake/alert, Lethargic (lethargic initially, becomes more alert with mobility) Behavior During Therapy: Flat affect Overall Cognitive Status: Difficult to assess Area of Impairment: Orientation, Attention, Following commands, Safety/judgement, Awareness, Problem solving                 Orientation Level: Disoriented to, Time Current Attention Level: Focused   Following Commands: Follows one step commands inconsistently, Follows one step commands with increased time Safety/Judgement: Decreased awareness of safety, Decreased awareness of deficits Awareness: Intellectual Problem Solving: Slow processing, Decreased initiation, Requires verbal cues       General Comments  VSS RA    Exercises Exercises: Other exercises Other Exercises Other Exercises: using visual attention to attempt L side activation   Shoulder Instructions      Home Living Family/patient expects to be discharged to:: Skilled nursing facility     Type of Home: [place]                                  Prior Functioning/Environment Prior Level of Function : Needs assist             Mobility Comments: pt transfers with use of similar equipment to  sara+, mobilizes on power scooter, does ambulate with PT and use of quad cane          OT Problem List: Decreased strength;Decreased range of motion;Decreased activity tolerance;Impaired balance (sitting and/or standing);Impaired vision/perception;Decreased coordination;Decreased cognition;Decreased safety awareness;Decreased knowledge of use of DME or AE;Decreased knowledge of precautions;Impaired sensation;Obesity;Impaired UE functional use       OT Treatment/Interventions: Self-care/ADL training;Therapeutic exercise;Neuromuscular education;Energy conservation;DME and/or AE instruction;Manual therapy;Modalities;Splinting;Therapeutic activities;Patient/family education;Balance training;Visual/perceptual remediation/compensation    OT Goals(Current goals can be found in the care plan section) Acute Rehab OT Goals Patient Stated Goal: to drink water OT Goal Formulation: With patient/family Time For Goal Achievement: 10/11/22 Potential to Achieve Goals: Fair  OT Frequency: Min 2X/week    Co-evaluation PT/OT/SLP Co-Evaluation/Treatment: Yes Reason for Co-Treatment: Complexity of the patient's impairments (multi-system involvement);For patient/therapist safety;To address functional/ADL transfers   OT goals addressed during session: ADL's and self-care;Proper use of Adaptive equipment and DME;Strengthening/ROM      AM-PAC OT "6 Clicks" Daily Activity     Outcome Measure Help from another person eating meals?: A Lot Help from another person taking care of personal grooming?: A Lot Help from another person toileting, which includes using toliet, bedpan, or urinal?: Total Help from another person bathing (including washing, rinsing, drying)?: Total Help from another person to put on and taking off regular upper body clothing?: A Lot Help from another person to put on and taking off regular lower body clothing?: Total 6 Click Score: 9   End of Session Nurse Communication: Mobility status;Precautions  Activity Tolerance: Patient tolerated treatment well Patient left: in bed;with call bell/phone within reach;with family/visitor  present  OT Visit Diagnosis: Unsteadiness on feet (R26.81);Muscle weakness (generalized) (M62.81);Hemiplegia and hemiparesis Hemiplegia - Right/Left: Left Hemiplegia - dominant/non-dominant: Non-Dominant Hemiplegia - caused by: Cerebral infarction                Time: 6681-5947 OT Time Calculation (min):  27 min Charges:  OT General Charges $OT Visit: 1 Visit OT Evaluation $OT Eval Moderate Complexity: 1 Mod   Brynn, OTR/L  Acute Rehabilitation Services Office: [phone] .   Jeri Modena 09/27/2022, 4:56 PM

## 2022-09-27 NOTE — Evaluation (Signed)
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 ([place]) 08/26/2020   Sacral fracture, closed ([place])    Pelvic fracture ([place]) 08/17/2020   Hemorrhage 03/04/2020   Asymptomatic varicose veins of both lower extremities 01/29/2020   Onychomycosis of left great toe 01/29/2020   Xerosis cutis 01/29/2020   Ventricular tachycardia ([place]) 10/22/2019   Presence of cardiac pacemaker 10/22/2019   Atrioventricular block, complete ([place]) 05/26/2019   Gastroesophageal reflux disease without esophagitis 10/02/2018   Primary osteoarthritis involving multiple joints 10/02/2018   Hypercoagulable state due to atrial fibrillation ([place]) 10/02/2018   Trigger middle finger of right hand 10/26/2017   Ganglion, left wrist 11/12/2015   Lower GI bleed    Internal bleeding hemorrhoids    Rectal ulcer    Constipation    Rectal bleeding 10/17/2015   Actinic keratosis 05/25/2014   Tennis elbow 10/27/2013   Pain in joint, ankle and foot 10/27/2013   Hyperglycemia 04/28/2013   Hemorrhoids 09/16/2012   Coronary artery disease involving native coronary artery of native heart without angina pectoris 09/12/2010   Atrial fibrillation ([place]) 09/07/2010   Long term (current) use of anticoagulants 09/06/2010   Hyperlipidemia 03/17/2010   OLD MYOCARDIAL INFARCTION 03/16/2010   PREMATURE VENTRICULAR CONTRACTIONS 03/16/2010   Osteoporosis 05/13/2009    Meralgia paresthetica 07/08/2007   Essential hypertension 07/17/1997   Past Medical History:  Past Medical History:  Diagnosis Date   Actinic keratosis 05/25/2014   Anxiety    Atrial fibrillation ([place]) 09/21/2010   Basal cell carcinoma 05/25/2014   Multiple removed by Dr. Danella Sensing in March 2015: right neck, left neck, scalp    Bell's palsy 07/18/1979   Cervicalgia 01/31/2012   Closed fracture of lumbar vertebra without mention of spinal cord injury 07/17/2005   Conjunctiva disorder 12/26/2010   Coronary atherosclerosis of native coronary artery 07/17/1997   Cramp of limb 08/16/2009   Degeneration of lumbar or lumbosacral intervertebral disc 01/31/2012   Disturbance of skin sensation 08/2009   Dizziness and giddiness 11/08/2011   vertigo   External hemorrhoids without mention of complication 09/32/6712   External hemorrhoids without mention of complication 45/80/9983   Ganglion of tendon sheath 08/16/2009   Leg cramp 10/27/2013   Most frequently the right leg.    Long term (current) use of anticoagulants 09/2010   Lumbago 07/2009   Meralgia paresthetica 07/2007   MI, old    Myalgia and myositis, unspecified 01/17/2012   Osteoporosis    Other abnormal blood chemistry 04/08/2012   Other abnormal blood chemistry 2013   hyperglycemia   Other disorder of muscle, ligament, and fascia 04/08/2012   Other specified cardiac dysrhythmias(427.89) 06/27/2010   Pacemaker 05/06/2020   Pain in joint, ankle and foot 10/27/2013   Bilateral since 1998    Pain in joint, shoulder region 08/12/2012   Pain in joint, upper arm 12/26/2010   Pain in limb 01/11/2011   Pain in thoracic spine 01/31/2012   Pathologic fracture of vertebrae 01/22/2012   PVC's (premature ventricular  contractions)    Rash and other nonspecific skin eruption 08/02/2011   Senile osteoporosis 07/17/1993   Stroke ([place])    Unspecified essential hypertension 07/17/1997   Varicose veins of lower extremities 08/16/2009    Varicose veins of lower extremities 08/16/2009   Past Surgical History:  Past Surgical History:  Procedure Laterality Date   COLONOSCOPY WITH PROPOFOL N/A 10/19/2015   Procedure: COLONOSCOPY WITH PROPOFOL;  Surgeon: Gatha Mayer, MD;  Location: [facility];  Service: Endoscopy;  Laterality: N/A;   CORONARY ARTERY BYPASS GRAFT  1998   x2; LIMA to LAD; SVG to diagonal off bypass   HEMORRHOID SURGERY  08/26/2012   Dr. Brantley Stage   LOOP RECORDER INSERTION N/A 05/13/2018   Procedure: LOOP RECORDER INSERTION;  Surgeon: [name], MD;  Location: [facility];  Service: Cardiovascular;  Laterality: N/A;   LOOP RECORDER REMOVAL N/A 05/26/2019   Procedure: LOOP RECORDER REMOVAL;  Surgeon: [name], MD;  Location: [facility];  Service: Cardiovascular;  Laterality: N/A;   MASS EXCISION Left 11/23/2015   Procedure: LEFT WRIST EXCISION CYST;  Surgeon: Leanora Cover, MD;  Location: [facility];  Service: Orthopedics;  Laterality: Left;   PACEMAKER IMPLANT N/A 05/26/2019   Procedure: PACEMAKER IMPLANT;  Surgeon: [name], MD;  Location: [facility];  Service: Cardiovascular;  Laterality: N/A;   SKIN BIOPSY  01/29/14   (R) neck; (R) scalp, 2 (L) neck; shave biopsy Superficial basal cell carcinoma Dr. Danella Sensing   TONSILLECTOMY  [phone]   HPI:  Pt is a 88 year old female who presented with dysarthria, left facial droop, and right daze preference. TNK given.CT head negative for acute changes. CTA neck 11/21: Occlusion of the right internal carotid artery, ccclusion of the right PCA proximally with reconstitution of flow in the P2 segment, short-segment occlusion of a right M2 branch proximally and additional focal severe stenosis of an inferior right M2 segment  proximally. PMH: atrial fibrillation not on anticoagulation due to a previous bleeding left knee Bakers cyst but maintained on aspirin, right basal ganglia ischemic infarction in 2021 with resultant left  upper extremity weakness and spasticity resulting in contracture as well as left lower extremity weakness. Pt seen for dysphagia in 2021 and was discharged on dysphagia 3 and thin liquids. Per pt's daughter, the pt subsequently received SLP services and was advanced to regular and thin liquids.   Assessment / Plan / Recommendation Clinical Impression  Pt participated in speech-language-cognition evaluation with her daughter present. She denied the pt having any significant deficits in the aforementioned areas prior to admission. However, she stated that the pt's speech is now "not smooth", and that her thinking now takes more time. Pt denied any baseline or acute deficits in speech, language, or cognition. Pt also presented with mild-moderate dysarthria characterized by reduced articulatory precision, reduced vocal intensity and a hoarse vocal quality which negatively impacted speech intelligibility. [facility] Mental Status Examination was completed to evaluate the pt's cognitive-linguistic skills. She achieved a score of 6/30 which is below the normal limits of 27 or more out of 30. She exhibited difficulty in the areas of awareness, attention, memory, and executive function. Pt required consistent prompts to attend and she was noted to pull various items in the bed when support was not provided. Repetition was necessary across tasks due to impairments in memory and attention, but this did not significantly improve accuracy. Pt's daughter reported that the pt was tired and  the impact of this on her performance is considered. Skilled SLP services are clinically indicated at this time to improve motor speech and cognitive-linguistic function.    SLP Assessment  SLP Recommendation/Assessment: Patient needs continued Speech Lanaguage Pathology Services SLP Visit Diagnosis: Dysarthria and anarthria (R47.1);Cognitive communication deficit (R41.841)    Recommendations for follow up therapy are  one component of a multi-disciplinary discharge planning process, led by the attending physician.  Recommendations may be updated based on patient status, additional functional criteria and insurance authorization.    Follow Up Recommendations   (Continued SLP services at level of care recommended by PT/OT)    Assistance Recommended at Discharge  Frequent or constant Supervision/Assistance  Functional Status Assessment Patient has had a recent decline in their functional status and demonstrates the ability to make significant improvements in function in a reasonable and predictable amount of time.  Frequency and Duration min 2x/week  2 weeks      SLP Evaluation Cognition  Overall Cognitive Status: Impaired/Different from baseline Arousal/Alertness: Awake/alert Orientation Level: Oriented to person;Disoriented to time;Oriented to situation Year:  (2032) Day of Week: Incorrect Attention: Focused;Sustained Focused Attention: Impaired Focused Attention Impairment: Verbal complex Sustained Attention: Impaired Sustained Attention Impairment: Verbal complex;Verbal basic Memory: Impaired Memory Impairment:  (Immediate: 5/5 with repetition x2; delayed: 0/5; with cues: 0/5) Awareness: Impaired Awareness Impairment: Intellectual impairment Problem Solving: Impaired Problem Solving Impairment: Verbal complex (money: 0/3) Executive Function: Sequencing;Organizing Sequencing: Impaired Sequencing Impairment: Verbal complex (clock: 0/4) Organizing: Impaired Organizing Impairment: Verbal complex (backward digit span: 0/3)       Comprehension  Auditory Comprehension Yes/No Questions: Impaired Basic Immediate Environment Questions:  (2/4) Commands: Impaired One Step Basic Commands:  (3/5) Interfering Components: Attention;Processing speed;Working memory    Expression Expression Primary Mode of Expression: Verbal Verbal Expression Initiation: Impaired Naming: Impairment Divergent:  (6  in one minute) Pragmatics: Impairment Impairments: Topic appropriateness;Topic maintenance;Abnormal affect Interfering Components: Attention   Oral / Motor  Oral Motor/Sensory Function Overall Oral Motor/Sensory Function: Moderate impairment Facial ROM: Reduced left;Suspected CN VII (facial) dysfunction Facial Symmetry: Abnormal symmetry left;Suspected CN VII (facial) dysfunction Facial Strength: Reduced left;Suspected CN VII (facial) dysfunction Lingual ROM: Reduced left;Suspected CN XII (hypoglossal) dysfunction;Reduced right Lingual Strength: Reduced;Suspected CN XII (hypoglossal) dysfunction Motor Speech Overall Motor Speech: Impaired Respiration: Within functional limits Phonation: Low vocal intensity;Hoarse Articulation: Impaired Level of Impairment: Sentence Intelligibility: Intelligibility reduced Word: 75-100% accurate Phrase: 50-74% accurate Sentence: 50-74% accurate Motor Planning: Witnin functional limits Motor Speech Errors: Aware;Consistent           Ellesse Antenucci I. Hardin Negus, [place], [place] Office number [phone]  Horton Marshall 09/27/2022, 11:40 AM

## 2022-09-27 NOTE — Progress Notes (Signed)
After laying flat for MRI, after approx 2 min patient visibly gagging on secretions. Head elevated with pillows, Oxygen sats remained higher than 92%. With history of vaso vagal occurrences and impaired secretion clearance, this RN notified Janine Ores NP and immediately took the patient for a CT instead of an MRI. Daughter, Susan Conway also updated.

## 2022-09-27 NOTE — Progress Notes (Signed)
  Echocardiogram 2D Echocardiogram has been performed.  Susan Conway 09/27/2022, 4:22 PM

## 2022-09-27 NOTE — Progress Notes (Signed)
OT Cancellation Note  Patient Details Name: Susan Conway MRN: [id number] DOB: 02-09-1929   Cancelled Treatment:    Reason Eval/Treat Not Completed: Active bedrest order OT order received and appreciated however this conflicts with current bedrest order set. Please increase activity tolerance as appropriate and remove bedrest from orders. . Please contact OT at [phone] if bed rest order is discontinued. OT will hold evaluation at this time and will check back as time allows pending increased activity orders.   Jeri Modena 09/27/2022, 8:02 AM

## 2022-09-27 NOTE — Progress Notes (Signed)
Speech Language Pathology Treatment: Dysphagia  Patient Details Name: Susan Conway MRN: [id number] DOB: 09-18-1929 Today's Date: 09/27/2022 Time: 6160-7371 SLP Time Calculation (min) (ACUTE ONLY): 11 min  Assessment / Plan / Recommendation Clinical Impression  Pt's was seen for dysphagia treatment after radiology advised that the MBS could not be completed today due to a scheduling conflict with the pt's MRI. Pt tolerated nectar thick liquids via cup and straw without overt s/s of aspiration. Oral/pharyngeal delay suspected considering time of bolus administration versus that of hyolaryngeal movement. A dysphagia 1 diet with nectar thick liquids will be initiated with observance of swallowing precautions and full supervision to ensure observance. Pt's modified barium swallow study will be scheduled with radiology on a subsequent date if it still appears to be clinically indicated at that time. SLP will continue to follow pt.     HPI HPI: Pt is a 88 year old female who presented with dysarthria, left facial droop, and right daze preference. TNK given.CT head negative for acute changes. CTA neck 11/21: Occlusion of the right internal carotid artery, ccclusion of the right PCA proximally with reconstitution of flow in the P2 segment, short-segment occlusion of a right M2 branch proximally and additional focal severe stenosis of an inferior right M2 segment  proximally. PMH: atrial fibrillation not on anticoagulation due to a previous bleeding left knee Bakers cyst but maintained on aspirin, right basal ganglia ischemic infarction in 2021 with resultant left upper extremity weakness and spasticity resulting in contracture as well as left lower extremity weakness. Pt seen for dysphagia in 2021 and was discharged on dysphagia 3 and thin liquids. Per pt's daughter, the pt subsequently received SLP services and was advanced to regular and thin liquids.      SLP Plan  Continue with current plan of care;MBS  (pending radiology's schedule)  Patient needs continued Speech Lanaguage Pathology Services   Recommendations for follow up therapy are one component of a multi-disciplinary discharge planning process, led by the attending physician.  Recommendations may be updated based on patient status, additional functional criteria and insurance authorization.    Recommendations  Diet recommendations: Dysphagia 1 (puree);Nectar-thick liquid Liquids provided via: Cup;No straw Medication Administration: Crushed with puree Supervision: Staff to assist with self feeding;Full supervision/cueing for compensatory strategies Compensations: Slow rate;Small sips/bites;Follow solids with liquid Postural Changes and/or Swallow Maneuvers: Seated upright 90 degrees;Upright 30-60 min after meal                Oral Care Recommendations: Oral care BID Follow Up Recommendations:  (Continued SLP services at level of care recommended by PT/OT) Assistance recommended at discharge: Frequent or constant Supervision/Assistance SLP Visit Diagnosis: Dysarthria and anarthria (R47.1);Cognitive communication deficit (R41.841) Plan: Continue with current plan of care;MBS (pending radiology's schedule)          Susan Conway I. Susan Conway, [place], [place] Office number [phone]  Horton Marshall  09/27/2022, 12:11 PM

## 2022-09-28 ENCOUNTER — Inpatient Hospital Stay (HOSPITAL_COMMUNITY): Payer: Medicare Other

## 2022-09-28 DIAGNOSIS — I63511 Cerebral infarction due to unspecified occlusion or stenosis of right middle cerebral artery: Secondary | ICD-10-CM | POA: Diagnosis not present

## 2022-09-28 LAB — GLUCOSE, CAPILLARY
Glucose-Capillary: 112 mg/dL — ABNORMAL HIGH (ref 70–99)
Glucose-Capillary: 147 mg/dL — ABNORMAL HIGH (ref 70–99)
Glucose-Capillary: 125 mg/dL — ABNORMAL HIGH (ref 70–99)
Glucose-Capillary: 122 mg/dL — ABNORMAL HIGH (ref 70–99)

## 2022-09-28 MED ORDER — ATORVASTATIN CALCIUM 40 MG PO TABS
40.0000 mg | ORAL_TABLET | Freq: Every day | ORAL | Status: DC
  Administered 2022-09-28 – 2022-10-02 (×5): 40 mg via ORAL
  Filled 2022-09-28 (×5): qty 1

## 2022-09-28 MED ORDER — DICLOFENAC SODIUM 1 % EX GEL
2.0000 g | CUTANEOUS | Status: DC | PRN
  Administered 2022-10-01 (×3): 2 g via TOPICAL
  Filled 2022-09-28: qty 100

## 2022-09-28 NOTE — Progress Notes (Addendum)
  Signed      Patient transferred to room 3W38 at 2300 from ICU. Patient was sleeping quietly during transfer but daughter keep taking to the patient until patient awake, then daughter requested me to give tylenol but patient got cough during swallow. This nurse spent about two hours to this patient room to keep patient comfort and answer daughter concerns.

## 2022-09-28 NOTE — Progress Notes (Signed)
STROKE TEAM PROGRESS NOTE   INTERVAL HISTORY Her daughter is at the bedside.  Daughter at bedside.  She is concerned because her mom did not get much sleep overnight due to monitors and noise.  She is concerned that the patient is not getting enough to eat.  Discussed with nurse to help feed patient this morning.  She wants to have her mom back to her regular facility where to have rehab section.  Transition of care was consulted to help in this matter.  It would not be able to transfer until the holidays are over.  She asked for Voltaren gel for neck pain for mom.      Vitals:   09/28/22 0334 09/28/22 0448 09/28/22 0904 09/28/22 1249  BP:  (!) 150/84 (!) 176/85 135/79  Pulse:  79 95 64  Resp:  '18 19 14  '$ Temp:   98.4 F (36.9 C) 100.2 F (37.9 C)  TempSrc:   Axillary Axillary  SpO2: 95% 96% 96% 97%  Weight:       CBC:  Recent Labs  Lab 09/26/22 1517 09/26/22 1539  WBC  --  8.2  NEUTROABS  --  4.2  HGB 11.2* 10.7*  HCT 33.0* 33.5*  MCV  --  87.7  PLT  --  010    Basic Metabolic Panel:  Recent Labs  Lab 09/26/22 1517 09/26/22 1539  NA 134* 135  K 4.2 4.2  CL 104 94*  CO2  --  29  GLUCOSE 89 79  BUN 34* 33*  CREATININE 1.40* 1.40*  CALCIUM  --  9.2    Lipid Panel:  Recent Labs  Lab 09/27/22 0546  CHOL 161  TRIG 39  HDL 39*  CHOLHDL 4.1  VLDL 8  LDLCALC 114*    HgbA1c:  Recent Labs  Lab 09/26/22 1620  HGBA1C 5.7*    Urine Drug Screen: No results for input(s): "LABOPIA", "COCAINSCRNUR", "LABBENZ", "AMPHETMU", "THCU", "LABBARB" in the last 168 hours.  Alcohol Level  Recent Labs  Lab 09/26/22 1511  ETH <10     IMAGING past 24 hours ECHOCARDIOGRAM COMPLETE  Result Date: 09/27/2022    ECHOCARDIOGRAM REPORT   Patient Name:   BOBBYE PETTI Date of Exam: 09/27/2022 Medical Rec #:  [id number]   Height:       66.0 in Accession #:    [id number]  Weight:       131.4 lb Date of Birth:  July 17, 1929   BSA:          1.673 m Patient Age:    88 years    BP:            167/82 mmHg Patient Gender: F           HR:           68 bpm. Exam Location:  Inpatient Procedure: 2D Echo Indications:    stroke  History:        Patient has prior history of Echocardiogram examinations, most                 recent 08/20/2020. CAD, Pacemaker, Arrythmias:PVC and Atrial                 Fibrillation; Risk Factors:Hypertension and Dyslipidemia.  Sonographer:    Johny Chess RDCS Referring Phys: [id number] MCNEILL P KIRKPATRICK  Sonographer Comments: Image acquisition challenging due to uncooperative patient. IMPRESSIONS  1. Left ventricular ejection fraction, by estimation, is 60 to 65%. The left ventricle has normal function. The  left ventricle has no regional wall motion abnormalities. Left ventricular diastolic parameters are indeterminate.  2. Right ventricular systolic function is normal. The right ventricular size is normal. There is normal pulmonary artery systolic pressure.  3. Left atrial size was moderately dilated.  4. Right atrial size was severely dilated.  5. The mitral valve is degenerative. Trivial mitral valve regurgitation. No evidence of mitral stenosis.  6. Tricuspid valve regurgitation is moderate to severe.  7. The aortic valve is tricuspid. There is severe calcifcation of the aortic valve. Aortic valve regurgitation is not visualized. Aortic valve sclerosis/calcification is present, without any evidence of aortic stenosis. Comparison(s): No significant change from prior study. FINDINGS  Left Ventricle: Left ventricular ejection fraction, by estimation, is 60 to 65%. The left ventricle has normal function. The left ventricle has no regional wall motion abnormalities. The left ventricular internal cavity size was normal in size. There is  no left ventricular hypertrophy. Left ventricular diastolic parameters are indeterminate. Right Ventricle: The right ventricular size is normal. No increase in right ventricular wall thickness. Right ventricular systolic function is normal.  There is normal pulmonary artery systolic pressure. The tricuspid regurgitant velocity is 2.73 m/s, and  with an assumed right atrial pressure of 3 mmHg, the estimated right ventricular systolic pressure is 76.2 mmHg. Left Atrium: Left atrial size was moderately dilated. Right Atrium: Right atrial size was severely dilated. Pericardium: There is no evidence of pericardial effusion. Mitral Valve: The mitral valve is degenerative in appearance. Mild to moderate mitral annular calcification. Trivial mitral valve regurgitation. No evidence of mitral valve stenosis. Tricuspid Valve: The tricuspid valve is grossly normal. Tricuspid valve regurgitation is moderate to severe. Aortic Valve: The aortic valve is tricuspid. There is severe calcifcation of the aortic valve. Aortic valve regurgitation is not visualized. Aortic valve sclerosis/calcification is present, without any evidence of aortic stenosis. Pulmonic Valve: The pulmonic valve was normal in structure. Pulmonic valve regurgitation is trivial. No evidence of pulmonic stenosis. Aorta: The aortic root is normal in size and structure. IAS/Shunts: No atrial level shunt detected by color flow Doppler. Additional Comments: A device lead is visualized in the right ventricle and right atrium.  LEFT VENTRICLE PLAX 2D LVIDd:         3.90 cm LVIDs:         2.20 cm LV PW:         1.00 cm LV IVS:        0.90 cm LVOT diam:     1.60 cm LV SV:         28 LV SV Index:   17 LVOT Area:     2.01 cm  RIGHT VENTRICLE            IVC RV Basal diam:  2.90 cm    IVC diam: 1.80 cm RV S prime:     8.27 cm/s TAPSE (M-mode): 1.0 cm LEFT ATRIUM             Index        RIGHT ATRIUM           Index LA diam:        4.40 cm 2.63 cm/m   RA Area:     25.40 cm LA Vol (A2C):   69.8 ml 41.73 ml/m  RA Volume:   80.20 ml  47.94 ml/m LA Vol (A4C):   61.8 ml 36.94 ml/m LA Biplane Vol: 65.7 ml 39.28 ml/m  AORTIC VALVE LVOT Vmax:   75.50 cm/s LVOT Vmean:  48.400 cm/s LVOT VTI:    0.139 m  AORTA Ao Root  diam: 2.90 cm Ao Asc diam:  3.10 cm TRICUSPID VALVE TR Peak grad:   29.8 mmHg TR Vmax:        273.00 cm/s  SHUNTS Systemic VTI:  0.14 m Systemic Diam: 1.60 cm Rudean Haskell MD Electronically signed by Rudean Haskell MD Signature Date/Time: 09/27/2022/4:34:47 PM    Final     PHYSICAL EXAM GENERAL: Awake, alert, anosognosia obvious HEENT: - Normocephalic and atraumatic, dry mm, no lymphadenopathy, no Thyromegally LUNGS - Clear to auscultation bilaterally CV - S1S2 RRR, equal pulses bilaterally. ABDOMEN - Soft, nontender, nondistended with normoactive BS Ext: warm, well perfused, intact peripheral pulses, no pedal edema   NEURO:  Mental Status: AA&Ox1 to person Language: Hypophonic and slow. she is drowsy but easily arousable.  She will follow commands. Pupils equal round reactive.  Spontaneous ductions. When asked to give me her name she was spelled out. Motor:  RUE: At least 4/5 throughout LUE: 0/5, baseline from prior stroke RLE: At least 3/5 throughout     LLE: 1/5 distally and 2/5 hip flexor Tone: is normal and bulk is normal DTRs:  brisk on left but 2+ right hemibody Sensation- Intact to light touch throughout Coordination: No ataxia.  Left-sided unable to examine due to weakness. Gait- deferred  ASSESSMENT/PLAN Ms. KELIAH HARNED is a 88 y.o. female with history of atrial fibrillation not on anticoagulation due to a previous bleeding left knee Bakers cyst but maintained on aspirin, pacemaker placement, right basal ganglia ischemic infarction in 2021 with resultant left upper extremity weakness and spasticity resulting in contracture as well as left lower extremity weakness. She is presented with dysarthria, left facial droop and a right gaze preference. She is from [facility].  She could not tolerate MRI so repeat CT was ordered which showed large stroke.  She was previously on [facility] but was taken off do to a large bleeding cyst. Sees Dr. Lovena Le outpatient, last seen  10/10/2021 where he did recommend anticoagulation, but family was hesitant CHADS2VASC Score 7 (HTN, Age, prev Stroke, gender, vascular disease) stroke risk is 11.2%  Stroke:  Right hemisphere infarct s/p TNK Etiology:  likely embolic given afib not on anticoagulation Code Stroke CT head  1. No hemorrhage or definitive CT evidence of a new infarct. 2. Redemonstrated area of acute to subacute infarct in the periventricular white matter/right corona radiata.  CTA head & neck  1. 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 2. Short-segment occlusion of a right M2 branch proximally and additional focal severe stenosis of an inferior right M2 segment proximally. 3. Occlusion of the right PCA proximally with reconstitution of flow in the P2 segment. Note that the right PCA is also fetal in origin and primarily supplied by a now occluded posterior communicating artery. 4. Additional intracranial atherosclerotic disease resulting in severe stenosis of the left A2 segment, severe stenosis of the right V4 segment, and critical stenosis/occlusion of the left vertebral artery at the V3/V4 junction with diminutive caliber of the remainder of the V4 segment. 5. Calcified plaque in the left carotid bifurcation resulting in approximately 50% stenosis. 6. Severe stenosis at the origin of the right vertebral artery. 7. 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 Recommend vascular surgery referral. MRI  Unable to tolerate CT Head Repeat-  Large acute right PCA territory infarct. Hyperdense right PCA, compatible with known occlusion. 2D Echo 60 to 65%.  Left atrial enlargement to moderate degree. LDL 114 HgbA1c 5.7 VTE prophylaxis -     Diet   DIET - DYS 1 Room  service appropriate? No; Fluid consistency: Nectar Thick   aspirin 81 mg daily prior to admission, now on aspirin 81 mg daily. Therapy recommendations:  SNF Disposition:  pending  Hypertension Home meds:  Norvasc 5, atenolol 50, hydralazine, hydrochlorothiazide, losartan Stable BP systolic 670 PRN cardene Long-term BP goal normotensive  Atrial fibrillation Not on [place] Rate control with atenolol and digoxin  Hyperlipidemia Home meds:  None, resumed in hospital LDL 114, goal < 70 Add atorvastatin today. Continue statin at discharge   Other Stroke Risk Factors Advanced Age >/= 70  Hx stroke/TIA Coronary artery disease  Other Active Problems   Hospital day # 2    Irineo Gaulin,MD    To contact Stroke Continuity provider, please refer to [email or web address] After hours, contact General Neurology

## 2022-09-28 NOTE — TOC Progression Note (Signed)
Transition of Care [facility]) - Initial/Assessment Note    Patient Details  Name: Susan Conway MRN: [id number] Date of Birth: 1929-02-05  Transition of Care [facility]) CM/SW Contact:    Milinda Antis, [place] Phone Number: 09/28/2022, 1:44 PM  Clinical Narrative:                 LCSW informed by RN that patient's family requested to discuss SNF.  LCSW met with the patient's family at bedside.  The daughter stated that the patient is from [place] and would like the patient to return to one of the 10 beds on the rehab unit.  The patient is currently in room 118 at the facility which is not one of the "rehab" beds.  LCSW informed the family that this information would be followed up on after the holiday.   TOC will continue to follow.    Patient Goals and CMS Choice        Expected Discharge Plan and Services                                                Prior Living Arrangements/Services                       Activities of Daily Living      Permission Sought/Granted                  Emotional Assessment              Admission diagnosis:  Stroke (cerebrum) ([place]) [I63.9] Acute ischemic stroke [facility]) [I63.9] Patient Active Problem List   Diagnosis Date Noted   Stroke (cerebrum) ([place]) 09/26/2022   DNR (do not resuscitate) 08/26/2020   Thrombocytopenia ([place]) 08/26/2020   Traumatic retroperitoneal hematoma 08/26/2020   Acute blood loss anemia 08/26/2020   Dysarthria as late effect of cerebellar cerebrovascular accident (CVA) 08/26/2020   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 ([place]) 08/26/2020   Sacral fracture, closed ([place])    Pelvic fracture ([place]) 08/17/2020   Hemorrhage 03/04/2020   Asymptomatic varicose veins of both lower extremities 01/29/2020   Onychomycosis of left great toe 01/29/2020   Xerosis cutis 01/29/2020   Ventricular tachycardia ([place]) 10/22/2019   Presence of cardiac pacemaker  10/22/2019   Atrioventricular block, 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   Gastroesophageal reflux disease without esophagitis 10/02/2018   Primary osteoarthritis involving multiple joints 10/02/2018   Hypercoagulable state due to atrial fibrillation ([place]) 10/02/2018   Trigger middle finger of right hand 10/26/2017   Ganglion, left wrist 11/12/2015   Lower GI bleed    Internal bleeding hemorrhoids    Rectal ulcer    Constipation    Rectal bleeding 10/17/2015   Actinic keratosis 05/25/2014   Tennis elbow 10/27/2013   Pain in joint, ankle and foot 10/27/2013   Hyperglycemia 04/28/2013   Hemorrhoids 09/16/2012   Coronary artery disease involving native coronary artery of native heart without angina pectoris 09/12/2010   Atrial fibrillation ([place]) 09/07/2010   Long term (current) use of anticoagulants 09/06/2010   Hyperlipidemia 03/17/2010   OLD MYOCARDIAL INFARCTION 03/16/2010   PREMATURE VENTRICULAR CONTRACTIONS 03/16/2010   Osteoporosis 05/13/2009   Meralgia paresthetica 07/08/2007   Essential hypertension 07/17/1997   PCP:  [name], MD Pharmacy:   [place], [place] - [id number] [address] Phone: [phone] Fax: [phone]     Social Determinants of Health (SDOH) Interventions    Readmission Risk Interventions     No data to display

## 2022-09-29 ENCOUNTER — Inpatient Hospital Stay (HOSPITAL_COMMUNITY): Payer: Medicare Other

## 2022-09-29 DIAGNOSIS — I63511 Cerebral infarction due to unspecified occlusion or stenosis of right middle cerebral artery: Secondary | ICD-10-CM | POA: Diagnosis not present

## 2022-09-29 LAB — BASIC METABOLIC PANEL
Anion gap: 15 (ref 5–15)
BUN: 31 mg/dL — ABNORMAL HIGH (ref 8–23)
CO2: 21 mmol/L — ABNORMAL LOW (ref 22–32)
Calcium: 8.5 mg/dL — ABNORMAL LOW (ref 8.9–10.3)
Chloride: 105 mmol/L (ref 98–111)
Creatinine, Ser: 1.34 mg/dL — ABNORMAL HIGH (ref 0.44–1.00)
GFR, Estimated: 37 mL/min — ABNORMAL LOW (ref >60)
Glucose, Bld: 119 mg/dL — ABNORMAL HIGH (ref 70–99)
Potassium: 3.4 mmol/L — ABNORMAL LOW (ref 3.5–5.1)
Sodium: 141 mmol/L (ref 135–145)

## 2022-09-29 LAB — URINALYSIS, ROUTINE W REFLEX MICROSCOPIC
Bilirubin Urine: NEGATIVE
Glucose, UA: NEGATIVE mg/dL
Ketones, ur: NEGATIVE mg/dL
Leukocytes,Ua: NEGATIVE
Nitrite: NEGATIVE
Protein, ur: 100 mg/dL — AB
Specific Gravity, Urine: >1.03 — ABNORMAL HIGH (ref 1.005–1.030)
pH: 5.5 (ref 5.0–8.0)

## 2022-09-29 LAB — CBC
HCT: 27.5 % — ABNORMAL LOW (ref 36.0–46.0)
Hemoglobin: 9 g/dL — ABNORMAL LOW (ref 12.0–15.0)
MCH: 28 pg (ref 26.0–34.0)
MCHC: 32.7 g/dL (ref 30.0–36.0)
MCV: 85.7 fL (ref 80.0–100.0)
Platelets: 99 10*3/uL — ABNORMAL LOW (ref 150–400)
RBC: 3.21 MIL/uL — ABNORMAL LOW (ref 3.87–5.11)
RDW: 14.5 % (ref 11.5–15.5)
WBC: 12.1 10*3/uL — ABNORMAL HIGH (ref 4.0–10.5)
nRBC: 0 % (ref 0.0–0.2)

## 2022-09-29 LAB — URINALYSIS, MICROSCOPIC (REFLEX)

## 2022-09-29 LAB — GLUCOSE, CAPILLARY: Glucose-Capillary: 107 mg/dL — ABNORMAL HIGH (ref 70–99)

## 2022-09-29 NOTE — Progress Notes (Signed)
Physical Therapy Treatment Patient Details Name: Susan Conway MRN: [id number] DOB: [birth year] Today's Date: 09/29/2022   History of Present Illness 88 y.o. female presents to [facility] hospital on 09/26/2022 with dysarthria, L facial droop, R gaze. CTA revealed multifocal occlusions, pt received TNK. PMH includes R basal ganglia CVA, L knee bakers cyst, afib, MI, pacemaker, anxiety.    PT Comments    Pt with incremental progress towards acute goals. Pt with increased communication this date throughout session. Pt able to come to sitting EOB with max assist, and maintain sitting balance x1 for ~5 seconds. Pt with continued strong posterior lean in sitting needing mod-max assist to maintain upright. As pt fatigue increased pt pushing with RUE toward L,  pt with difficulty following commands to place and leave R hand in lap to correct push. Pt continues to demonstrate L inattention needing max multimodal cues to look and attend to left with pt unable. Current plan remains appropriate to address deficits and maximize functional independence and decrease caregiver burden. Pt continues to benefit from skilled PT services to progress toward functional mobility goals.     Recommendations for follow up therapy are one component of a multi-disciplinary discharge planning process, led by the attending physician.  Recommendations may be updated based on patient status, additional functional criteria and insurance authorization.  Follow Up Recommendations  Skilled nursing-short term rehab (<3 hours/day) Can patient physically be transported by private vehicle: No   Assistance Recommended at Discharge Frequent or constant Supervision/Assistance  Patient can return home with the following Two people to help with walking and/or transfers;Two people to help with bathing/dressing/bathroom;Assistance with cooking/housework;Direct supervision/assist for financial management;Assist for transportation;Help with stairs or ramp  for entrance;Assistance with feeding;Direct supervision/assist for medications management   Equipment Recommendations  None recommended by PT (SNF has needed DME)    Recommendations for Other Services       Precautions / Restrictions Precautions Precautions: Fall Precaution Comments: L neglect Restrictions Weight Bearing Restrictions: No     Mobility  Bed Mobility Overal bed mobility: Needs Assistance Bed Mobility: Supine to Sit, Sit to Supine     Supine to sit: Total assist, HOB elevated, Max assist Sit to supine: Total assist   General bed mobility comments: max assist to come to EOB, difficulty motor planning to bring RLE to and off EOB, able to follow commands to reach for rail with RUE    Transfers                   General transfer comment: defer to next session    Ambulation/Gait                   Stairs             Wheelchair Mobility    Modified Rankin (Stroke Patients Only)       Balance Overall balance assessment: Needs assistance Sitting-balance support: Single extremity supported, Feet unsupported, Bilateral upper extremity supported Sitting balance-Leahy Scale: Poor Sitting balance - Comments: max A-min guard, able to maintain balance x1 for ~5 seconds without physical assist, pt placing R hand on rail to assist with balance, intermittent pushing with R Postural control: Posterior lean, Left lateral lean                                  Cognition Arousal/Alertness: Awake/alert Behavior During Therapy: Flat affect Overall Cognitive Status: Difficult to assess Area of  Impairment: Orientation, Attention, Following commands, Safety/judgement, Awareness, Problem solving                 Orientation Level: Disoriented to, Time Current Attention Level: Focused   Following Commands: Follows one step commands inconsistently, Follows one step commands with increased time Safety/Judgement: Decreased awareness  of safety, Decreased awareness of deficits Awareness: Intellectual Problem Solving: Slow processing, Decreased initiation, Requires verbal cues General Comments: conversant throughout ,sometimes off topic        Exercises Other Exercises Other Exercises: discussed and pt able to demo AP, GS, HS, instructed to perform throughout day    General Comments General comments (skin integrity, edema, etc.): VSS on RA      Pertinent Vitals/Pain Pain Assessment Faces Pain Scale: Hurts a little bit Pain Location: back and neck Pain Descriptors / Indicators: Sore Pain Intervention(s): Limited activity within patient's tolerance, Monitored during session    Home Living                          Prior Function            PT Goals (current goals can now be found in the care plan section) Acute Rehab PT Goals PT Goal Formulation: With family Time For Goal Achievement: 10/11/22    Frequency    Min 3X/week      PT Plan      Co-evaluation              AM-PAC PT "6 Clicks" Mobility   Outcome Measure  Help needed turning from your back to your side while in a flat bed without using bedrails?: Total Help needed moving from lying on your back to sitting on the side of a flat bed without using bedrails?: Total Help needed moving to and from a bed to a chair (including a wheelchair)?: Total Help needed standing up from a chair using your arms (e.g., wheelchair or bedside chair)?: Total Help needed to walk in hospital room?: Total Help needed climbing 3-5 steps with a railing? : Total 6 Click Score: 6    End of Session   Activity Tolerance: Patient tolerated treatment well Patient left: in bed;with call bell/phone within reach;with bed alarm set;with family/visitor present Nurse Communication: Mobility status PT Visit Diagnosis: Other abnormalities of gait and mobility (R26.89);Muscle weakness (generalized) (M62.81);Other symptoms and signs involving the nervous  system (R29.898);Hemiplegia and hemiparesis Hemiplegia - Right/Left: Left Hemiplegia - dominant/non-dominant: Non-dominant Hemiplegia - caused by:  (prior CVA)     Time: 1497-0263 PT Time Calculation (min) (ACUTE ONLY): 26 min  Charges:  $Therapeutic Activity: 23-37 mins                     Laynie Espy R. PTA Acute Rehabilitation Services Office: [place] 09/29/2022, 10:05 AM

## 2022-09-29 NOTE — Progress Notes (Signed)
STROKE TEAM PROGRESS NOTE   INTERVAL HISTORY Her daughter is at the bedside.  She is better today more alert awake and interactive.  She did have a low-grade fever overnight but most recent 3 temperatures have been normal.  Will get chest x-ray and urinalysis.  SNF pending.   Vitals:   09/29/22 0200 09/29/22 0723 09/29/22 1105 09/29/22 1523  BP: (!) 154/70 (!) 143/68 (!) 162/86 136/85  Pulse:  72 71 73  Resp: '18 16 20 20  '$ Temp: 98.5 F (36.9 C) 98.2 F (36.8 C) 98.7 F (37.1 C) 99.8 F (37.7 C)  TempSrc: Axillary Oral Oral Oral  SpO2: 97% 96% 98% 94%  Weight:       CBC:  Recent Labs  Lab 09/26/22 1539 09/29/22 0513  WBC 8.2 12.1*  NEUTROABS 4.2  --   HGB 10.7* 9.0*  HCT 33.5* 27.5*  MCV 87.7 85.7  PLT 151 99*    Basic Metabolic Panel:  Recent Labs  Lab 09/26/22 1539 09/29/22 0513  NA 135 141  K 4.2 3.4*  CL 94* 105  CO2 29 21*  GLUCOSE 79 119*  BUN 33* 31*  CREATININE 1.40* 1.34*  CALCIUM 9.2 8.5*    Lipid Panel:  Recent Labs  Lab 09/27/22 0546  CHOL 161  TRIG 39  HDL 39*  CHOLHDL 4.1  VLDL 8  LDLCALC 114*    HgbA1c:  Recent Labs  Lab 09/26/22 1620  HGBA1C 5.7*    Urine Drug Screen: No results for input(s): "LABOPIA", "COCAINSCRNUR", "LABBENZ", "AMPHETMU", "THCU", "LABBARB" in the last 168 hours.  Alcohol Level  Recent Labs  Lab 09/26/22 1511  ETH <10     IMAGING past 24 hours DG Swallowing Func-Speech Pathology  Result Date: 09/29/2022 Table formatting from the original result was not included. Objective Swallowing Evaluation: Type of Study: MBS-Modified Barium Swallow Study  Patient Details Name: Susan Conway MRN: [id number] Date of Birth: 1929/02/10 Today's Date: 09/29/2022 Time: SLP Start Time (ACUTE ONLY): 0836 -SLP Stop Time (ACUTE ONLY): 0901 SLP Time Calculation (min) (ACUTE ONLY): 25 min Past Medical History: Past Medical History: Diagnosis Date  Actinic keratosis 05/25/2014  Anxiety   Atrial fibrillation ([place]) 09/21/2010  Basal  cell carcinoma 05/25/2014  Multiple removed by Dr. Danella Sensing in March 2015: right neck, left neck, scalp   Bell's palsy 07/18/1979  Cervicalgia 01/31/2012  Closed fracture of lumbar vertebra without mention of spinal cord injury 07/17/2005  Conjunctiva disorder 12/26/2010  Coronary atherosclerosis of native coronary artery 07/17/1997  Cramp of limb 08/16/2009  Degeneration of lumbar or lumbosacral intervertebral disc 01/31/2012  Disturbance of skin sensation 08/2009  Dizziness and giddiness 11/08/2011  vertigo  External hemorrhoids without mention of complication 80/99/8338  External hemorrhoids without mention of complication 25/03/3975  Ganglion of tendon sheath 08/16/2009  Leg cramp 10/27/2013  Most frequently the right leg.   Long term (current) use of anticoagulants 09/2010  Lumbago 07/2009  Meralgia paresthetica 07/2007  MI, old   Myalgia and myositis, unspecified 01/17/2012  Osteoporosis   Other abnormal blood chemistry 04/08/2012  Other abnormal blood chemistry 2013  hyperglycemia  Other disorder of muscle, ligament, and fascia 04/08/2012  Other specified cardiac dysrhythmias(427.89) 06/27/2010  Pacemaker 05/06/2020  Pain in joint, ankle and foot 10/27/2013  Bilateral since 1998   Pain in joint, shoulder region 08/12/2012  Pain in joint, upper arm 12/26/2010  Pain in limb 01/11/2011  Pain in thoracic spine 01/31/2012  Pathologic fracture of vertebrae 01/22/2012  PVC's (premature ventricular contractions)  Rash and other nonspecific skin eruption 08/02/2011  Senile osteoporosis 07/17/1993  Stroke ([place])   Unspecified essential hypertension 07/17/1997  Varicose veins of lower extremities 08/16/2009  Varicose veins of lower extremities 08/16/2009 Past Surgical History: Past Surgical History: Procedure Laterality Date  COLONOSCOPY WITH PROPOFOL N/A 10/19/2015  Procedure: COLONOSCOPY WITH PROPOFOL;  Surgeon: Gatha Mayer, MD;  Location: [facility];  Service: Endoscopy;  Laterality: N/A;  CORONARY ARTERY  BYPASS GRAFT  1998  x2; LIMA to LAD; SVG to diagonal off bypass  HEMORRHOID SURGERY  08/26/2012  Dr. Brantley Stage  LOOP RECORDER INSERTION N/A 05/13/2018  Procedure: LOOP RECORDER INSERTION;  Surgeon: [name], MD;  Location: [facility];  Service: Cardiovascular;  Laterality: N/A;  LOOP RECORDER REMOVAL N/A 05/26/2019  Procedure: LOOP RECORDER REMOVAL;  Surgeon: [name], MD;  Location: [facility];  Service: Cardiovascular;  Laterality: N/A;  MASS EXCISION Left 11/23/2015  Procedure: LEFT WRIST EXCISION CYST;  Surgeon: Leanora Cover, MD;  Location: [facility];  Service: Orthopedics;  Laterality: Left;  PACEMAKER IMPLANT N/A 05/26/2019  Procedure: PACEMAKER IMPLANT;  Surgeon: [name], MD;  Location: [facility];  Service: Cardiovascular;  Laterality: N/A;  SKIN BIOPSY  01/29/14  (R) neck; (R) scalp, 2 (L) neck; shave biopsy Superficial basal cell carcinoma Dr. Danella Sensing  TONSILLECTOMY  [phone] HPI: Pt is a 88 year old female who presented with dysarthria, left facial droop, and right gaze preference. TNK given.CT head negative for acute changes. CTA neck 11/21: Occlusion of the right internal carotid artery, ccclusion of the right PCA proximally with reconstitution of flow in the P2 segment, short-segment occlusion of a right M2 branch proximally and additional focal severe stenosis of an inferior right M2 segment  proximally. PMH: atrial fibrillation not on anticoagulation due to a previous bleeding left knee Bakers cyst but maintained on aspirin, right basal ganglia ischemic infarction in 2021 with resultant left upper extremity weakness and spasticity resulting in contracture as well as left lower extremity weakness. Pt seen for dysphagia in 2021 and was discharged on dysphagia 3 and thin liquids. Per pt's daughter, the pt subsequently received SLP services and was advanced to regular and thin liquids.  Subjective: pt orally holding during MBS  Recommendations for  follow up therapy are one component of a multi-disciplinary discharge planning process, led by the attending physician.  Recommendations may be updated based on patient status, additional functional criteria and insurance authorization. Assessment / Plan / Recommendation   09/29/2022   9:00 AM Clinical Impressions Clinical Impression MBS was completed in limited fashion given oral holding requiring suctioning of boluses from oral cavity. Daughter present and believes this may be related to taste and/or level of alertness. Oral deficits include reduced lingual manipulation and posterior propulsion. There is reduced bolus cohesion and it takes a long time and some verbal cueing to get the pt to swallow liquids. Oral residue is present with liquids as well. Pt does not swallow purees despite offering dry spoon for tactile cueing and liquid washes (would not open her mouth to accept a cup, took minimal liquid off the spoon), requiring yankauer to remove. Pharyngeal phase could not be viewed with purees. In swallowing a small amount of nectar thick liquids and purees by spoon, pt had aspiration before the swallow with nectar thick liquids with a weak, delayed cough that did not clear the airway. Suspect that this was a result of premature spillage as it occurred very quickly - before  fluoro could be turned on, and while the remaining bolus was being held orally. Pt had small amounts of penetration with honey thick liquids but it all was ejected spontaneously (PAS 2). Discussed with daughter the limitations of this study, but that we could try staying on Dys 1 diet (purees) since she has been swallowing some of this upstairs, as well as honey thick liquids. Would monitor closely as a team for any signs of intolerance. SLP Visit Diagnosis Dysphagia, oropharyngeal phase (R13.12) Impact on safety and function Moderate aspiration risk;Risk for inadequate nutrition/hydration     09/29/2022   9:00 AM Treatment Recommendations  Treatment Recommendations Therapy as outlined in treatment plan below     09/29/2022   9:00 AM Prognosis Prognosis for Safe Diet Advancement Good Barriers to Reach Goals Cognitive deficits;Severity of deficits   09/29/2022   9:00 AM Diet Recommendations SLP Diet Recommendations Dysphagia 1 (Puree) solids;Honey thick liquids Liquid Administration via Cup;Spoon Medication Administration Crushed with puree Compensations Slow rate;Small sips/bites;Follow solids with liquid Postural Changes Seated upright at 90 degrees;Remain semi-upright after after feeds/meals (Comment)     09/29/2022   9:00 AM Other Recommendations Oral Care Recommendations Oral care BID Follow Up Recommendations Skilled nursing-short term rehab (<3 hours/day) Functional Status Assessment Patient has had a recent decline in their functional status and demonstrates the ability to make significant improvements in function in a reasonable and predictable amount of time.   09/29/2022   9:00 AM Frequency and Duration  Speech Therapy Frequency (ACUTE ONLY) min 2x/week Treatment Duration 2 weeks     09/29/2022   9:00 AM Oral Phase Oral Phase Impaired Oral - Honey Teaspoon Weak lingual manipulation;Reduced posterior propulsion;Decreased bolus cohesion;Lingual/palatal residue;Delayed oral transit Oral - Nectar Teaspoon Weak lingual manipulation;Reduced posterior propulsion;Decreased bolus cohesion;Lingual/palatal residue;Delayed oral transit Oral - Puree Holding of bolus;Other (Comment)    09/29/2022   9:00 AM Pharyngeal Phase Pharyngeal Phase Impaired Pharyngeal- Honey Teaspoon Penetration/Aspiration before swallow;Reduced epiglottic inversion;Reduced tongue base retraction;Reduced airway/laryngeal closure;Pharyngeal residue - valleculae Pharyngeal Material enters airway, remains ABOVE vocal cords then ejected out Pharyngeal- Nectar Teaspoon Penetration/Aspiration before swallow;Reduced epiglottic inversion;Reduced tongue base retraction;Reduced  airway/laryngeal closure;Pharyngeal residue - valleculae Pharyngeal Material enters airway, passes BELOW cords and not ejected out despite cough attempt by patient    09/29/2022   9:00 AM Cervical Esophageal Phase  Cervical Esophageal Phase [facility] Osie Bond., M.A. [place] [facility] Office [phone] Secure chat preferred 09/29/2022, 9:48 AM                     DG CHEST PORT 1 VIEW  Result Date: 09/29/2022 CLINICAL DATA:  Aspiration into lower respiratory tract. EXAM: PORTABLE CHEST 1 VIEW COMPARISON:  08/25/2020. FINDINGS: The heart is enlarged and the mediastinal contour stable. Atherosclerotic calcification of the aorta is noted. No consolidation, effusion, or pneumothorax. No acute osseous abnormality. A single lead pacemaker device is present over the left chest. Sternotomy wires are present over the midline. IMPRESSION: 1. No active disease. 2. Cardiomegaly. Electronically Signed   By: Brett Fairy M.D.   On: 09/29/2022 01:05    PHYSICAL EXAM GENERAL: Awake, alert  HEENT: - Normocephalic and atraumatic, dry mm, no lymphadenopathy, no Thyromegally Ext: warm, well perfused, intact peripheral pulses, no pedal edema   NEURO:  Mental Status: AA&Ox1 to person.  Will open eyes and look around.  She will spell her name. Language: Hypophonic and slow. she is drowsy but easily arousable.  She will follow commands. Pupils equal round reactive.  Spontaneous ductions but has a right gaze preference. . Motor:  RUE: At least 4/5 throughout LUE: 0/5, baseline from prior stroke RLE: At least 3/5 throughout     LLE: 1/5 distally and 2/5 hip flexor Tone: is normal and bulk is normal DTRs:  brisk on left but 2+ right hemibody Sensation- Intact to light touch throughout Coordination: No ataxia.  Left-sided unable to examine due to weakness. Gait- deferred  ASSESSMENT/PLAN Susan Conway is a 88 y.o. female with history of atrial fibrillation not on anticoagulation due to a previous  bleeding left knee Bakers cyst but maintained on aspirin, pacemaker placement, right basal ganglia ischemic infarction in 2021 with resultant left upper extremity weakness and spasticity resulting in contracture as well as left lower extremity weakness. She is presented with dysarthria, left facial droop and a right gaze preference. She is from [facility].  She could not tolerate MRI so repeat CT was ordered which showed large stroke.  She was previously on [facility] but was taken off do to a large bleeding cyst. Sees Dr. Lovena Le outpatient, last seen 10/10/2021 where he did recommend anticoagulation, but family was hesitant CHADS2VASC Score 7 (HTN, Age, prev Stroke, gender, vascular disease) stroke risk is 11.2%  Stroke:  Right hemisphere infarct s/p TNK Etiology:  likely embolic given afib not on anticoagulation Code Stroke CT head  1. No hemorrhage or definitive CT evidence of a new infarct. 2. Redemonstrated area of acute to subacute infarct in the periventricular white matter/right corona radiata.  CTA head & neck  1. 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 2. Short-segment occlusion of a right M2 branch proximally and additional focal severe stenosis of an inferior right M2 segment proximally. 3. Occlusion of the right PCA proximally with reconstitution of flow in the P2 segment. Note that the right PCA is also fetal in origin and primarily supplied by a now occluded posterior communicating artery. 4. Additional intracranial atherosclerotic disease resulting in severe stenosis of the left A2 segment, severe stenosis of the right V4 segment, and critical stenosis/occlusion of the left vertebral artery at the V3/V4 junction with diminutive caliber of the remainder of the V4 segment. 5. Calcified plaque in the left carotid bifurcation resulting in approximately 50% stenosis. 6.  Severe stenosis at the origin of the right vertebral artery. 7. 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 Recommend vascular surgery referral. MRI  Unable to tolerate CT Head Repeat-  Large acute right PCA territory infarct. Hyperdense right PCA, compatible with known occlusion. 2D Echo 60 to 65%.  Left atrial enlargement to moderate degree. LDL 114 HgbA1c 5.7 VTE prophylaxis -     Diet   DIET - DYS 1 Room service appropriate? No; Fluid consistency: Honey Thick   aspirin 81 mg daily prior to admission, now on aspirin 81 mg daily. Therapy recommendations:  SNF Disposition:  pending  Hypertension Home meds:  Norvasc 5, atenolol 50, hydralazine, hydrochlorothiazide, losartan Stable BP systolic 626 PRN cardene Long-term BP goal normotensive  Atrial fibrillation Not on [place] Rate control with atenolol and digoxin  Hyperlipidemia Home meds:  None, resumed in hospital LDL 114, goal < 70 Add atorvastatin today. Continue statin at discharge   Other Stroke Risk Factors Advanced Age >/= 33  Hx stroke/TIA Coronary  artery disease  Other Active Problems Low-grade fever overnight.  WBC elevated.  Continue to monitor hold on antibiotics for now  FEN: She is able to eat with assistance.  Speech recommends dysphagia 1 diet with honey thick liquids.  Calorie count ordered.  Hospital day # 3    Liset Mcmonigle,MD    To contact Stroke Continuity provider, please refer to [email or web address] After hours, contact General Neurology

## 2022-09-29 NOTE — Progress Notes (Signed)
Modified Barium Swallow Progress Note  Patient Details  Name: Susan Conway MRN: [id number] Date of Birth: [birth year]  Today's Date: 09/29/2022  Modified Barium Swallow completed.  Full report located under Chart Review in the Imaging Section.  Brief recommendations include the following:  Clinical Impression  MBS was completed in limited fashion given oral holding requiring suctioning of boluses from oral cavity. Daughter present and believes this may be related to taste and/or level of alertness. Oral deficits include reduced lingual manipulation and posterior propulsion. There is reduced bolus cohesion and it takes a long time and some verbal cueing to get the pt to swallow liquids. Oral residue is present with liquids as well. Pt does not swallow purees despite offering dry spoon for tactile cueing and liquid washes (would not open her mouth to accept a cup, took minimal liquid off the spoon), requiring yankauer to remove. Pharyngeal phase could not be viewed with purees. In swallowing a small amount of nectar thick liquids and purees by spoon, pt had aspiration before the swallow with nectar thick liquids with a weak, delayed cough that did not clear the airway. Suspect that this was a result of premature spillage as it occurred very quickly - before fluoro could be turned on, and while the remaining bolus was being held orally. Pt had small amounts of penetration with honey thick liquids but it all was ejected spontaneously (PAS 2). Discussed with daughter the limitations of this study, but that we could try staying on Dys 1 diet (purees) since she has been swallowing some of this upstairs, as well as honey thick liquids. Would monitor closely as a team for any signs of intolerance.   Swallow Evaluation Recommendations       SLP Diet Recommendations: Dysphagia 1 (Puree) solids;Honey thick liquids   Liquid Administration via: Cup;Spoon   Medication Administration: Crushed with puree    Supervision: Staff to assist with self feeding;Full supervision/cueing for compensatory strategies   Compensations: Slow rate;Small sips/bites;Follow solids with liquid   Postural Changes: Seated upright at 90 degrees;Remain semi-upright after after feeds/meals (Comment)   Oral Care Recommendations: Oral care BID        Osie Bond., M.A. [place] Office [phone]  Secure chat preferred  09/29/2022,9:47 AM

## 2022-09-30 DIAGNOSIS — I480 Paroxysmal atrial fibrillation: Secondary | ICD-10-CM | POA: Diagnosis not present

## 2022-09-30 DIAGNOSIS — Z8673 Personal history of transient ischemic attack (TIA), and cerebral infarction without residual deficits: Secondary | ICD-10-CM

## 2022-09-30 DIAGNOSIS — I63511 Cerebral infarction due to unspecified occlusion or stenosis of right middle cerebral artery: Secondary | ICD-10-CM | POA: Diagnosis not present

## 2022-09-30 LAB — CBC
HCT: 28.9 % — ABNORMAL LOW (ref 36.0–46.0)
Hemoglobin: 9.2 g/dL — ABNORMAL LOW (ref 12.0–15.0)
MCH: 28 pg (ref 26.0–34.0)
MCHC: 31.8 g/dL (ref 30.0–36.0)
MCV: 88.1 fL (ref 80.0–100.0)
Platelets: 96 10*3/uL — ABNORMAL LOW (ref 150–400)
RBC: 3.28 MIL/uL — ABNORMAL LOW (ref 3.87–5.11)
RDW: 14.4 % (ref 11.5–15.5)
WBC: 10.7 10*3/uL — ABNORMAL HIGH (ref 4.0–10.5)
nRBC: 0 % (ref 0.0–0.2)

## 2022-09-30 LAB — BASIC METABOLIC PANEL
Anion gap: 11 (ref 5–15)
BUN: 34 mg/dL — ABNORMAL HIGH (ref 8–23)
CO2: 22 mmol/L (ref 22–32)
Calcium: 8.5 mg/dL — ABNORMAL LOW (ref 8.9–10.3)
Chloride: 108 mmol/L (ref 98–111)
Creatinine, Ser: 1.25 mg/dL — ABNORMAL HIGH (ref 0.44–1.00)
GFR, Estimated: 41 mL/min — ABNORMAL LOW (ref >60)
Glucose, Bld: 107 mg/dL — ABNORMAL HIGH (ref 70–99)
Potassium: 3.6 mmol/L (ref 3.5–5.1)
Sodium: 141 mmol/L (ref 135–145)

## 2022-09-30 MED ORDER — HEPARIN SODIUM (PORCINE) 5000 UNIT/ML IJ SOLN
5000.0000 [IU] | Freq: Three times a day (TID) | INTRAMUSCULAR | Status: DC
  Administered 2022-10-01 – 2022-10-02 (×6): 5000 [IU] via SUBCUTANEOUS
  Filled 2022-09-30 (×6): qty 1

## 2022-09-30 NOTE — Plan of Care (Signed)
  Problem: Education: Goal: Knowledge of disease or condition will improve Outcome: Progressing Goal: Knowledge of secondary prevention will improve (MUST DOCUMENT ALL) Outcome: Progressing Goal: Knowledge of patient specific risk factors will improve [place] N/A or DELETE if not current risk factor) Outcome: Progressing   Problem: Ischemic Stroke/TIA Tissue Perfusion: Goal: Complications of ischemic stroke/TIA will be minimized Outcome: Progressing   Problem: Coping: Goal: Will verbalize positive feelings about self Outcome: Progressing Goal: Will identify appropriate support needs Outcome: Progressing   Problem: Health Behavior/Discharge Planning: Goal: Ability to manage health-related needs will improve Outcome: Progressing Goal: Goals will be collaboratively established with patient/family Outcome: Progressing   Problem: Self-Care: Goal: Verbalization of feelings and concerns over difficulty with self-care will improve Outcome: Progressing Goal: Ability to communicate needs accurately will improve Outcome: Progressing   Problem: Education: Goal: Knowledge of General Education information will improve Description: Including pain rating scale, medication(s)/side effects and non-pharmacologic comfort measures Outcome: Progressing   Problem: Health Behavior/Discharge Planning: Goal: Ability to manage health-related needs will improve Outcome: Progressing   Problem: Clinical Measurements: Goal: Ability to maintain clinical measurements within normal limits will improve Outcome: Progressing Goal: Will remain free from infection Outcome: Progressing Goal: Diagnostic test results will improve Outcome: Progressing Goal: Respiratory complications will improve Outcome: Progressing Goal: Cardiovascular complication will be avoided Outcome: Progressing   Problem: Activity: Goal: Risk for activity intolerance will decrease Outcome: Progressing   Problem: Nutrition: Goal:  Adequate nutrition will be maintained Outcome: Progressing   Problem: Coping: Goal: Level of anxiety will decrease Outcome: Progressing   Problem: Elimination: Goal: Will not experience complications related to bowel motility Outcome: Progressing Goal: Will not experience complications related to urinary retention Outcome: Progressing   Problem: Pain Managment: Goal: General experience of comfort will improve Outcome: Progressing   Problem: Safety: Goal: Ability to remain free from injury will improve Outcome: Progressing   Problem: Skin Integrity: Goal: Risk for impaired skin integrity will decrease Outcome: Progressing

## 2022-09-30 NOTE — Progress Notes (Signed)
STROKE TEAM PROGRESS NOTE   INTERVAL HISTORY Her daughter is at the bedside. Pt lying in bed, eyes open, asking for coffee. Speech on board recommend dysphagia 1 and honey thick liquid.    Vitals:   09/30/22 1321 09/30/22 1716 09/30/22 1815 09/30/22 2010  BP: (!) 184/133 (!) 181/62 (!) 168/108 (!) 189/98  Pulse:  76 77 92  Resp:  14  20  Temp:  98.9 F (37.2 C)  99.1 F (37.3 C)  TempSrc:  Axillary  Oral  SpO2:  98%  99%  Weight:       CBC:  Recent Labs  Lab 09/26/22 1539 09/29/22 0513 09/30/22 0931  WBC 8.2 12.1* 10.7*  NEUTROABS 4.2  --   --   HGB 10.7* 9.0* 9.2*  HCT 33.5* 27.5* 28.9*  MCV 87.7 85.7 88.1  PLT 151 99* 96*   Basic Metabolic Panel:  Recent Labs  Lab 09/29/22 0513 09/30/22 0931  NA 141 141  K 3.4* 3.6  CL 105 108  CO2 21* 22  GLUCOSE 119* 107*  BUN 31* 34*  CREATININE 1.34* 1.25*  CALCIUM 8.5* 8.5*   Lipid Panel:  Recent Labs  Lab 09/27/22 0546  CHOL 161  TRIG 39  HDL 39*  CHOLHDL 4.1  VLDL 8  LDLCALC 114*   HgbA1c:  Recent Labs  Lab 09/26/22 1620  HGBA1C 5.7*   Urine Drug Screen: No results for input(s): "LABOPIA", "COCAINSCRNUR", "LABBENZ", "AMPHETMU", "THCU", "LABBARB" in the last 168 hours.  Alcohol Level  Recent Labs  Lab 09/26/22 1511  ETH <10    IMAGING past 24 hours No results found.  PHYSICAL EXAM GENERAL: Awake, alert  HEENT: - Normocephalic and atraumatic, dry mm, no lymphadenopathy, no Thyromegally Ext: warm, well perfused, intact peripheral pulses, no pedal edema   NEURO:  Mental Status: AA&Ox1 to person.  Will open eyes and look around.  She will spell her name. Language: Hypophonic and slow. she is drowsy but easily arousable.  She will follow commands. Pupils equal round reactive.  Spontaneous ductions but has a right gaze preference. . Motor:  RUE: At least 4/5 throughout LUE: 0/5, baseline from prior stroke RLE: At least 3/5 throughout     LLE: 1/5 distally and 2/5 hip flexor Tone: is normal and  bulk is normal DTRs:  brisk on left but 2+ right hemibody Sensation- Intact to light touch throughout Coordination: No ataxia.  Left-sided unable to examine due to weakness. Gait- deferred  ASSESSMENT/PLAN Susan Conway is a 88 y.o. female with history of atrial fibrillation not on anticoagulation due to a previous bleeding but maintained on aspirin, pacemaker placement, right basal ganglia ischemic infarction in 2021 with resultant left upper extremity weakness and spasticity resulting in contracture as well as left lower extremity weakness. She is presented with dysarthria, left facial droop and a right gaze preference. She is from [facility].  She could not tolerate MRI so repeat CT was ordered which showed large stroke.  Stroke:  Right large PCA infarct s/p TNK, likely embolic given afib not on AC Code Stroke CT head - area of acute to subacute infarct in the periventricular white matter/right corona radiata.  CTA head & neck - Occlusion of the right ICA with reconstitution of flow at the M1 segment, age indeterminate but new since 2021. Short-segment occlusion of a right M2 branch proximally and additional focal severe stenosis of an inferior right M2 segment proximally. Occlusion of the right PCA proximally with reconstitution of flow in  the P2 segment. Note that the right PCA is also fetal in origin and primarily supplied by a now occluded posterior communicating artery. Severe stenosis of the left A2, right V1 and V4 and left V3/V4 junction. Calcified plaque in the left carotid bifurcation resulting in approximately 50% stenosis.  MRI  Unable to tolerate (lying flat) due to back pain CT Head Repeat-  Large acute right PCA territory infarct. Hyperdense right PCA, compatible with known occlusion. 2D Echo 60 to 65%.  Left atrial enlargement to moderate degree. LDL 114 HgbA1c 5.7 VTE prophylaxis - heparin subq aspirin 81 mg daily prior to admission, now on aspirin 81 mg daily. Therapy  recommendations:  SNF Disposition:  pending  Atrial fibrillation CHADS2VASC Score 7 (HTN, Age, prev Stroke, gender, vascular disease) stroke risk is 11.2% She was previously on [facility] but was taken off do to a large bleeding cyst. Sees Dr. Lovena Le outpatient, last seen 10/10/2021 where he did recommend anticoagulation, but family was hesitant Rate control with atenolol and digoxin Daughter would like to continue ASA for now and will follow up with Dr. Lovena Le to discuss about [facility]  History of stroke 08/2020 admitted for a fall with pelvic fracture and large retroperitoneal hemorrhage.  But developed left-sided weakness.  CT no acute abnormality.  MRI showed right BG infarct, likely due to A-fib not on AC.  MRA head and neck showed right M2 occlusion with subsequent reconstitution.  LDL 81.3, EF 60 to 65%.  A1c 5.7.  Discharged on aspirin 81.  History of bleeding History of GI bleeding on Coumadin History of Baker's cyst hemorrhage on Eliquis 08/2020 admitted for a fall with pelvic fracture and large retroperitoneal hemorrhage.   Not on anticoagulation PTA, but on aspirin 81.  Hypertension Home meds:  Norvasc 5, atenolol 50, hydralazine, hydrochlorothiazide, losartan Stable BP systolic <416 Long-term BP goal normotensive  Hyperlipidemia Home meds:  None LDL 114, goal < 70 Add atorvastatin 40 Continue statin at discharge  Fever  Leukocytosis  Tmax 101.8 WBC 8.2->12.1->10.7 UA and CXR neg Aspiration precautions  Dysphagia Speech on board on dysphagia 1 and honey thick liquid  Other Stroke Risk Factors Advanced Age >/= 86  Coronary artery disease  Other Active Problems AKI, creatinine 1.40-1.34. Mild thrombocytopenia platelet 151->99  Hospital day # 4   Rosalin Hawking, MD PhD Stroke Neurology 09/30/2022 10:36 PM   To contact Stroke Continuity provider, please refer to [email or web address] After hours, contact General Neurology

## 2022-09-30 NOTE — Progress Notes (Signed)
Speech Language Pathology Treatment: Dysphagia;Cognitive-Linquistic  Patient Details Name: Susan Conway MRN: [id number] DOB: August 11, 1929 Today's Date: 09/30/2022 Time: 1121-1228 SLP Time Calculation (min) (ACUTE ONLY): 67 min  Assessment / Plan / Recommendation Clinical Impression  Requested by family to come by to see pt due to concerns about swallowing.  Susan Conway was alert and participatory.  We discussed symptoms of frequent and strong throat-clearing associated with eating this morning.  Oral care was provided - pt was able to complete tooth-brushing with mod assist from clinician. Demonstrates deficits in oral sequencing and control for manipulation of secretions.  After oral care, provided with sips of honey-thick liquid - pt demonstrated prolonged oral phase, spillage from left side of mouth. Moderate verbal/tactile cues were provided.   Discussed with pt and her family nature of dysphagia, the waxing and waning that will be seen with overall swallowing ability, her good performance this afternoon, the signs we need to watch for when swallowing is not going as well and when to cease feeding. Her daughter verbalized understanding.  We also discussed some of the perseveratory patterns we are seeing (e.g., face washing, tooth-brushing, and likely with mastication).  Discussed with Susan Conway when perseveration is happening - she was able to stop the pattern when cued.   Updated swallowing sign was posted.  Recommend continuing dysphagia 1/honey thick liquids; crush meds; continue SLP for swallowing and cognition.    HPI HPI: Pt is a 88 year old female who presented with dysarthria, left facial droop, and right gaze preference. TNK given.CT head negative for acute changes. CTA neck 11/21: Occlusion of the right internal carotid artery, ccclusion of the right PCA proximally with reconstitution of flow in the P2 segment, short-segment occlusion of a right M2 branch proximally and additional focal  severe stenosis of an inferior right M2 segment  proximally. PMH: atrial fibrillation not on anticoagulation due to a previous bleeding left knee Bakers cyst but maintained on aspirin, right basal ganglia ischemic infarction in 2021 with resultant left upper extremity weakness and spasticity resulting in contracture as well as left lower extremity weakness. Pt seen for dysphagia in 2021 and was discharged on dysphagia 3 and thin liquids. Per pt's daughter, the pt subsequently received SLP services and was advanced to regular and thin liquids.      SLP Plan  Continue with current plan of care      Recommendations for follow up therapy are one component of a multi-disciplinary discharge planning process, led by the attending physician.  Recommendations may be updated based on patient status, additional functional criteria and insurance authorization.    Recommendations  Diet recommendations: Dysphagia 1 (puree);Honey-thick liquid Liquids provided via: Cup;No straw Medication Administration: Crushed with puree Supervision: Staff to assist with self feeding;Full supervision/cueing for compensatory strategies Compensations: Slow rate;Small sips/bites Postural Changes and/or Swallow Maneuvers: Seated upright 90 degrees                Oral Care Recommendations: Oral care BID Follow Up Recommendations: Skilled nursing-short term rehab (<3 hours/day) Assistance recommended at discharge: Frequent or constant Supervision/Assistance SLP Visit Diagnosis: Dysphagia, oropharyngeal phase (R13.12) Plan: Continue with current plan of care         Kumar Falwell L. Tivis Ringer, MA CCC/SLP Clinical Specialist - Acute Care SLP Acute Rehabilitation Services Office number [phone]   [name]  09/30/2022, 12:57 PM

## 2022-10-01 DIAGNOSIS — I63511 Cerebral infarction due to unspecified occlusion or stenosis of right middle cerebral artery: Secondary | ICD-10-CM | POA: Diagnosis not present

## 2022-10-01 DIAGNOSIS — Z8673 Personal history of transient ischemic attack (TIA), and cerebral infarction without residual deficits: Secondary | ICD-10-CM | POA: Diagnosis not present

## 2022-10-01 LAB — CBC
HCT: 25.7 % — ABNORMAL LOW (ref 36.0–46.0)
Hemoglobin: 8.5 g/dL — ABNORMAL LOW (ref 12.0–15.0)
MCH: 28.8 pg (ref 26.0–34.0)
MCHC: 33.1 g/dL (ref 30.0–36.0)
MCV: 87.1 fL (ref 80.0–100.0)
Platelets: 87 10*3/uL — ABNORMAL LOW (ref 150–400)
RBC: 2.95 MIL/uL — ABNORMAL LOW (ref 3.87–5.11)
RDW: 14.6 % (ref 11.5–15.5)
WBC: 11 10*3/uL — ABNORMAL HIGH (ref 4.0–10.5)
nRBC: 0 % (ref 0.0–0.2)

## 2022-10-01 LAB — BASIC METABOLIC PANEL
Anion gap: 11 (ref 5–15)
BUN: 36 mg/dL — ABNORMAL HIGH (ref 8–23)
CO2: 20 mmol/L — ABNORMAL LOW (ref 22–32)
Calcium: 8.2 mg/dL — ABNORMAL LOW (ref 8.9–10.3)
Chloride: 111 mmol/L (ref 98–111)
Creatinine, Ser: 1.21 mg/dL — ABNORMAL HIGH (ref 0.44–1.00)
GFR, Estimated: 42 mL/min — ABNORMAL LOW (ref >60)
Glucose, Bld: 114 mg/dL — ABNORMAL HIGH (ref 70–99)
Potassium: 3.3 mmol/L — ABNORMAL LOW (ref 3.5–5.1)
Sodium: 142 mmol/L (ref 135–145)

## 2022-10-01 MED ORDER — LOSARTAN POTASSIUM 25 MG PO TABS
25.0000 mg | ORAL_TABLET | Freq: Every day | ORAL | Status: DC
  Administered 2022-10-01 – 2022-10-02 (×2): 25 mg via ORAL
  Filled 2022-10-01 (×2): qty 1

## 2022-10-01 MED ORDER — ATENOLOL 25 MG PO TABS
50.0000 mg | ORAL_TABLET | Freq: Two times a day (BID) | ORAL | Status: DC
  Administered 2022-10-01 – 2022-10-02 (×2): 50 mg via ORAL
  Filled 2022-10-01 (×2): qty 2

## 2022-10-01 MED ORDER — AMLODIPINE BESYLATE 5 MG PO TABS
5.0000 mg | ORAL_TABLET | Freq: Every day | ORAL | Status: DC
  Administered 2022-10-01 – 2022-10-02 (×2): 5 mg via ORAL
  Filled 2022-10-01 (×2): qty 1

## 2022-10-01 MED ORDER — DIGOXIN 125 MCG PO TABS
0.1250 mg | ORAL_TABLET | ORAL | Status: DC
  Administered 2022-10-01: 0.125 mg via ORAL
  Filled 2022-10-01 (×2): qty 1

## 2022-10-01 MED ORDER — POTASSIUM CHLORIDE 20 MEQ PO PACK
40.0000 meq | PACK | Freq: Two times a day (BID) | ORAL | Status: AC
  Administered 2022-10-01 (×2): 40 meq via ORAL
  Filled 2022-10-01 (×2): qty 2

## 2022-10-01 NOTE — Progress Notes (Signed)
STROKE TEAM PROGRESS NOTE   INTERVAL HISTORY Her daughter and son in law are at the bedside. Pt lying in bed, eyes open, introducing her family to me in a slow dysarthric voice. Still has left hemiplegia and right gaze. Afebrile overnight.     Vitals:   09/30/22 2010 09/30/22 2322 10/01/22 0815 10/01/22 1217  BP: (!) 189/98 (!) 168/79 (!) 157/86 (!) 180/81  Pulse: 92 90 95 78  Resp: '20 20  14  '$ Temp: 99.1 F (37.3 C) 98.3 F (36.8 C) 98.3 F (36.8 C) (!) 97.4 F (36.3 C)  TempSrc: Oral Oral Oral Oral  SpO2: 99% 98% 97% 98%  Weight:       CBC:  Recent Labs  Lab 09/26/22 1539 09/29/22 0513 09/30/22 0931 10/01/22 0806  WBC 8.2   < > 10.7* 11.0*  NEUTROABS 4.2  --   --   --   HGB 10.7*   < > 9.2* 8.5*  HCT 33.5*   < > 28.9* 25.7*  MCV 87.7   < > 88.1 87.1  PLT 151   < > 96* 87*   < > = values in this interval not displayed.   Basic Metabolic Panel:  Recent Labs  Lab 09/30/22 0931 10/01/22 0806  NA 141 142  K 3.6 3.3*  CL 108 111  CO2 22 20*  GLUCOSE 107* 114*  BUN 34* 36*  CREATININE 1.25* 1.21*  CALCIUM 8.5* 8.2*   Lipid Panel:  Recent Labs  Lab 09/27/22 0546  CHOL 161  TRIG 39  HDL 39*  CHOLHDL 4.1  VLDL 8  LDLCALC 114*   HgbA1c:  Recent Labs  Lab 09/26/22 1620  HGBA1C 5.7*   Urine Drug Screen: No results for input(s): "LABOPIA", "COCAINSCRNUR", "LABBENZ", "AMPHETMU", "THCU", "LABBARB" in the last 168 hours.  Alcohol Level  Recent Labs  Lab 09/26/22 1511  ETH <10    IMAGING past 24 hours No results found.  PHYSICAL EXAM  Temp:  [97.4 F (36.3 C)-99.1 F (37.3 C)] 97.4 F (36.3 C) (11/26 1217) Pulse Rate:  [76-95] 78 (11/26 1217) Resp:  [14-20] 14 (11/26 1217) BP: (157-189)/(62-108) 180/81 (11/26 1217) SpO2:  [97 %-99 %] 98 % (11/26 1217)  General - Well nourished, well developed, in no apparent distress.  Ophthalmologic - fundi not visualized due to noncooperation.  Cardiovascular - irregularly irregular heart rate and  rhythm.  Neuro - awake, alert, lethargic but eyes open, orientated to age, place, time and people, however, slow responding with psychomotor slowing and moderate dysarthria. No aphasia, speaks slow but following all simple commands. Able to name and repeat. Right gaze preference and not cross midline, tracking on the right, visual field deficit on the left. Left facial droop. Tongue midline. RUE 3-4/5 no drift, LUE flaccid. RLE at least 3/5, LLE flicker movement as requested. Sensation exam not quite cooperative, right FTN intact grossly but slow, gait not tested.     ASSESSMENT/PLAN Susan Conway is a 88 y.o. female with history of atrial fibrillation not on anticoagulation due to a previous bleeding but maintained on aspirin, pacemaker placement, right basal ganglia ischemic infarction in 2021 with resultant left upper extremity weakness and spasticity resulting in contracture as well as left lower extremity weakness. She is presented with dysarthria, left facial droop and a right gaze preference. She is from [facility].  She could not tolerate MRI so repeat CT was ordered which showed large stroke.  Stroke:  Right large PCA infarct s/p TNK, likely embolic  given afib not on [facility] Code Stroke CT head - area of acute to subacute infarct in the periventricular white matter/right corona radiata.  CTA head & neck - Occlusion of the right ICA with reconstitution of flow at the M1 segment, age indeterminate but new since 2021. Short-segment occlusion of a right M2 branch proximally and additional focal severe stenosis of an inferior right M2 segment proximally. Occlusion of the right PCA proximally with reconstitution of flow in the P2 segment. Note that the right PCA is also fetal in origin and primarily supplied by a now occluded posterior communicating artery. Severe stenosis of the left A2, right V1 and V4 and left V3/V4 junction. Calcified plaque in the left carotid bifurcation resulting in  approximately 50% stenosis.  MRI  Unable to tolerate (lying flat) due to back pain CT Head Repeat-  Large acute right PCA territory infarct. Hyperdense right PCA, compatible with known occlusion. 2D Echo 60 to 65%.  Left atrial enlargement to moderate degree. LDL 114 HgbA1c 5.7 VTE prophylaxis - heparin subq aspirin 81 mg daily prior to admission, now on aspirin 81 mg daily. Therapy recommendations:  SNF Disposition:  pending  Atrial fibrillation CHADS2VASC Score 7 (HTN, Age, prev Stroke, gender, vascular disease) stroke risk is 11.2% She was previously on [facility] but was taken off do to a large bleeding cyst. Sees Dr. Lovena Le outpatient, last seen 10/10/2021 where he did recommend anticoagulation, but family was hesitant Rate control with atenolol and digoxin Daughter would like to continue ASA for now and will follow up with Dr. Lovena Le to discuss about [facility]  History of stroke 08/2020 admitted for a fall with pelvic fracture and large retroperitoneal hemorrhage.  But developed left-sided weakness.  CT no acute abnormality.  MRI showed right BG infarct, likely due to A-fib not on AC.  MRA head and neck showed right M2 occlusion with subsequent reconstitution.  LDL 81.3, EF 60 to 65%.  A1c 5.7.  Discharged on aspirin 81.  History of bleeding History of GI bleeding on Coumadin History of Baker's cyst hemorrhage on Eliquis 08/2020 admitted for a fall with pelvic fracture and large retroperitoneal hemorrhage.   Not on anticoagulation PTA, but on aspirin 81.  Hypertension Home meds:  Norvasc 5, atenolol 50, hydralazine, hydrochlorothiazide, losartan Stable BP systolic <280 Long-term BP goal normotensive  Hyperlipidemia Home meds:  None LDL 114, goal < 70 Add atorvastatin 40 Continue statin at discharge  Fever  Leukocytosis  Tmax 101.8-> afebrile WBC 8.2->12.1->10.7->11.0 UA and CXR neg Aspiration precautions  Dysphagia Speech on board on dysphagia 1 and honey thick  liquid Aspiration precaution  Other Stroke Risk Factors Advanced Age >/= 65  Coronary artery disease  Other Active Problems AKI, creatinine 1.40-1.34-1.25-1.21. Mild thrombocytopenia platelet 151->99-> 87 Anemia, hemoglobin 9.2->8.5  Hospital day # 5  I spent extensive face-to-face time with the patient, her daughter and son-in-law, more than 50% of which was spent in counseling and coordination of care, reviewing test results, images and medication, and discussing the diagnosis, treatment plan and potential prognosis. This patient's care requiresreview of multiple databases, neurological assessment, discussion with family, other specialists and medical decision making of high complexity.    Rosalin Hawking, MD PhD Stroke Neurology 10/01/2022 2:27 PM   To contact Stroke Continuity provider, please refer to [email or web address] After hours, contact General Neurology

## 2022-10-01 NOTE — Plan of Care (Signed)

## 2022-10-02 ENCOUNTER — Other Ambulatory Visit (HOSPITAL_COMMUNITY): Payer: Self-pay

## 2022-10-02 DIAGNOSIS — R1310 Dysphagia, unspecified: Secondary | ICD-10-CM

## 2022-10-02 DIAGNOSIS — D72829 Elevated white blood cell count, unspecified: Secondary | ICD-10-CM | POA: Diagnosis present

## 2022-10-02 DIAGNOSIS — N179 Acute kidney failure, unspecified: Secondary | ICD-10-CM | POA: Diagnosis present

## 2022-10-02 DIAGNOSIS — R509 Fever, unspecified: Secondary | ICD-10-CM | POA: Diagnosis present

## 2022-10-02 DIAGNOSIS — D649 Anemia, unspecified: Secondary | ICD-10-CM | POA: Diagnosis present

## 2022-10-02 LAB — CBC
HCT: 27.2 % — ABNORMAL LOW (ref 36.0–46.0)
Hemoglobin: 8.8 g/dL — ABNORMAL LOW (ref 12.0–15.0)
MCH: 28 pg (ref 26.0–34.0)
MCHC: 32.4 g/dL (ref 30.0–36.0)
MCV: 86.6 fL (ref 80.0–100.0)
Platelets: 109 10*3/uL — ABNORMAL LOW (ref 150–400)
RBC: 3.14 MIL/uL — ABNORMAL LOW (ref 3.87–5.11)
RDW: 14.6 % (ref 11.5–15.5)
WBC: 9.9 10*3/uL (ref 4.0–10.5)
nRBC: 0 % (ref 0.0–0.2)

## 2022-10-02 LAB — BASIC METABOLIC PANEL
Anion gap: 7 (ref 5–15)
BUN: 35 mg/dL — ABNORMAL HIGH (ref 8–23)
CO2: 19 mmol/L — ABNORMAL LOW (ref 22–32)
Calcium: 8.2 mg/dL — ABNORMAL LOW (ref 8.9–10.3)
Chloride: 116 mmol/L — ABNORMAL HIGH (ref 98–111)
Creatinine, Ser: 1.09 mg/dL — ABNORMAL HIGH (ref 0.44–1.00)
GFR, Estimated: 48 mL/min — ABNORMAL LOW (ref >60)
Glucose, Bld: 108 mg/dL — ABNORMAL HIGH (ref 70–99)
Potassium: 3.9 mmol/L (ref 3.5–5.1)
Sodium: 142 mmol/L (ref 135–145)

## 2022-10-02 MED ORDER — POLYETHYLENE GLYCOL 3350 17 G PO PACK
17.0000 g | PACK | Freq: Every day | ORAL | Status: DC
  Administered 2022-10-02: 17 g via ORAL
  Filled 2022-10-02: qty 1

## 2022-10-02 MED ORDER — ATENOLOL 50 MG PO TABS
50.0000 mg | ORAL_TABLET | Freq: Two times a day (BID) | ORAL | 0 refills | Status: DC
  Filled 2022-10-02: qty 30, 15d supply, fill #0

## 2022-10-02 MED ORDER — ATORVASTATIN CALCIUM 40 MG PO TABS
40.0000 mg | ORAL_TABLET | Freq: Every day | ORAL | 0 refills | Status: DC
  Filled 2022-10-02: qty 30, 30d supply, fill #0

## 2022-10-02 MED ORDER — DOCUSATE SODIUM 50 MG/5ML PO LIQD
100.0000 mg | Freq: Two times a day (BID) | ORAL | Status: DC
  Administered 2022-10-02: 100 mg via ORAL
  Filled 2022-10-02: qty 10

## 2022-10-02 MED ORDER — ASPIRIN 81 MG PO CHEW
81.0000 mg | CHEWABLE_TABLET | Freq: Every day | ORAL | 0 refills | Status: DC
  Filled 2022-10-02: qty 30, 30d supply, fill #0

## 2022-10-02 MED ORDER — DIGOXIN 125 MCG PO TABS
0.1250 mg | ORAL_TABLET | ORAL | 0 refills | Status: DC
  Filled 2022-10-02: qty 15, 30d supply, fill #0

## 2022-10-02 MED ORDER — DICLOFENAC SODIUM 1 % EX GEL
2.0000 g | CUTANEOUS | 0 refills | Status: DC | PRN
  Filled 2022-10-02: qty 100, fill #0

## 2022-10-02 MED ORDER — BISACODYL 10 MG RE SUPP
10.0000 mg | Freq: Once | RECTAL | Status: AC
  Administered 2022-10-02: 10 mg via RECTAL
  Filled 2022-10-02: qty 1

## 2022-10-02 NOTE — TOC Progression Note (Addendum)
Transition of Care [facility]) - Progression Note    Patient Details  Name: SAVANAH BAYLES MRN: [id number] Date of Birth: 10-Feb-1929  [facility] of [facility] CM/SW Contact  Jinger Neighbors, [place] Phone Number: 10/02/2022, 1:32 PM  Clinical Narrative:     CSW informed pt's daughter pt was ready for discharge. Pt's daughter appeared "surprised" and asked about pt's IV antibiotics. CSW informed her IV would be removed before d/c. Daughter then reported her mother had not had a bm since last Tuesday; CSW informed her CSW would notifiy medical team. CSW messaged Langley Gauss to make her aware and she stated she would put in an order to assist. CSW went to pt's room to notify pt and daughter. Pt's daughter stated she was concerned about pt's toe and her right hand. CSW informed her she can speak to the medical team regarding her medical concerns, but CSW will move forward with discharge planning. CSW spoke with facility and they confirmed pt can come back and CSW and facility discussed the amount of PT recommended.        Expected Discharge Plan and Services                                                 Social Determinants of Health (SDOH) Interventions    Readmission Risk Interventions     No data to display

## 2022-10-02 NOTE — Progress Notes (Signed)
On 11/26, patient's grandson was able to feed her the following: 100% of electrolyte water Whole cup of honey-thick water Partial cup of thickened orange juice Whole cup of thickened sweet tea.

## 2022-10-02 NOTE — Progress Notes (Signed)
Report called to Ander Purpura, RN at [facility]

## 2022-10-02 NOTE — Progress Notes (Signed)
Physical Therapy Treatment Patient Details Name: Susan Conway MRN: [id number] DOB: 1929/08/09 Today's Date: 10/02/2022   History of Present Illness 88 y.o. female presents to [facility] hospital on 09/26/2022 with dysarthria, L facial droop, R gaze. CTA revealed multifocal occlusions, pt received TNK. PMH includes R basal ganglia CVA, L knee bakers cyst, afib, MI, pacemaker, anxiety.    PT Comments     Pt making incremental progress towards acute goals this session. Pt needing max-total assist for bed mobility secondary to weakness and L inattention. Pt with improved sitting balance this date demonstrating ability to maintain throughout with min assist and intermittently without assist. Pt with noted kyphotic posture needing cues to extend head/neck and bring shoulders back, improved with this PTA behind pt for manual static stretch. Pt continues to push with RUE toward L as fatigue increases and continues to demonstrate difficultly attending to task at hand. Current plan remains appropriate to address deficits and maximize functional independence and decrease caregiver burden. Pt continues to benefit from skilled PT services to progress toward functional mobility goals.    Recommendations for follow up therapy are one component of a multi-disciplinary discharge planning process, led by the attending physician.  Recommendations may be updated based on patient status, additional functional criteria and insurance authorization.  Follow Up Recommendations  Skilled nursing-short term rehab (<3 hours/day) Can patient physically be transported by private vehicle: No   Assistance Recommended at Discharge Frequent or constant Supervision/Assistance  Patient can return home with the following Two people to help with walking and/or transfers;Two people to help with bathing/dressing/bathroom;Assistance with cooking/housework;Direct supervision/assist for financial management;Assist for transportation;Help with  stairs or ramp for entrance;Assistance with feeding;Direct supervision/assist for medications management   Equipment Recommendations  None recommended by PT (SNF has needed DME)    Recommendations for Other Services       Precautions / Restrictions Precautions Precautions: Fall Precaution Comments: L neglect Restrictions Weight Bearing Restrictions: No     Mobility  Bed Mobility Overal bed mobility: Needs Assistance Bed Mobility: Supine to Sit, Sit to Supine     Supine to sit: HOB elevated, Max assist Sit to supine: Total assist   General bed mobility comments: max assist to come to EOB, difficulty motor planning to bring RLE to and off EOB, able to follow commands to reach for rail with RUE, total assist for LLE    Transfers                   General transfer comment: unable    Ambulation/Gait                   Stairs             Wheelchair Mobility    Modified Rankin (Stroke Patients Only)       Balance Overall balance assessment: Needs assistance Sitting-balance support: Single extremity supported, Feet supported Sitting balance-Leahy Scale: Poor Sitting balance - Comments: max A-min guard, able to maintain balance intermittently for ~5 seconds without physical assist, pt placing R hand on rail to assist with balance, intermittent pushing with R with increased fatigue Postural control: Posterior lean, Left lateral lean                                  Cognition Arousal/Alertness: Awake/alert Behavior During Therapy: Flat affect Overall Cognitive Status: Difficult to assess Area of Impairment: Orientation, Attention, Following commands, Safety/judgement, Awareness, Problem solving  Orientation Level: Disoriented to, Time Current Attention Level: Sustained     Safety/Judgement: Decreased awareness of safety, Decreased awareness of deficits Awareness: Emergent Problem Solving: Slow processing,  Decreased initiation, Requires verbal cues General Comments: Pt has very slow processing speed with speech and activity but appears to be accuarate and insightful to deficits this am. Daughter is very willing to learn and accepted feedback well.        Exercises      General Comments General comments (skin integrity, edema, etc.): Pt making slow progress with cognition and visual perceptual skills related to adls.      Pertinent Vitals/Pain Pain Assessment Pain Assessment: Faces Faces Pain Scale: Hurts a little bit Pain Location: back and neck Pain Descriptors / Indicators: Sore Pain Intervention(s): Monitored during session, Limited activity within patient's tolerance    Home Living                          Prior Function            PT Goals (current goals can now be found in the care plan section) Acute Rehab PT Goals PT Goal Formulation: With family Time For Goal Achievement: 10/11/22    Frequency    Min 3X/week      PT Plan      Co-evaluation              AM-PAC PT "6 Clicks" Mobility   Outcome Measure  Help needed turning from your back to your side while in a flat bed without using bedrails?: Total Help needed moving from lying on your back to sitting on the side of a flat bed without using bedrails?: Total Help needed moving to and from a bed to a chair (including a wheelchair)?: Total Help needed standing up from a chair using your arms (e.g., wheelchair or bedside chair)?: Total Help needed to walk in hospital room?: Total Help needed climbing 3-5 steps with a railing? : Total 6 Click Score: 6    End of Session   Activity Tolerance: Patient tolerated treatment well Patient left: in bed;with call bell/phone within reach;with bed alarm set;with family/visitor present Nurse Communication: Mobility status PT Visit Diagnosis: Other abnormalities of gait and mobility (R26.89);Muscle weakness (generalized) (M62.81);Other symptoms and signs  involving the nervous system (R29.898);Hemiplegia and hemiparesis Hemiplegia - Right/Left: Left Hemiplegia - dominant/non-dominant: Non-dominant Hemiplegia - caused by:  (prior CVA)     Time: 3729-0211 PT Time Calculation (min) (ACUTE ONLY): 34 min  Charges:  $Therapeutic Activity: 23-37 mins                    Shawnelle Spoerl R. PTA Acute Rehabilitation Services Office: [place] 10/02/2022, 11:58 AM

## 2022-10-02 NOTE — Progress Notes (Signed)
Speech Language Pathology Treatment: Dysphagia  Patient Details Name: Susan Conway MRN: [id number] DOB: 16-Jul-1929 Today's Date: 10/02/2022 Time: 2025-4270 SLP Time Calculation (min) (ACUTE ONLY): 40 min  Assessment / Plan / Recommendation Clinical Impression  RN shared that pt's daughter had some concerns about pt's swallowing. Pt was seen with daughter present (son-in-law intermittently there as well). One concern was about the amount the pt was accepting in light of anticipated discharge. She also was concerned about OT recommendation for self-feeding in light of dysphagia. Education was shared about some of the benefits from a swallowing perspective that can come from self-feeding, but with assistance still provided for safety. Pt needed heavy cueing to find cup when it was just L of midline, and she seems to have reduced awareness vs ability to control the volume that she takes at a time. SLP provided verbal and tactile cues to keep bolus sizes small. Intermittent throat clearing was noted during intake with honey thick liquids and purees, but no coughing. Daughter did ask about this, to which SLP shared that it should be considered within the broader picture. MBS was very limited, and there is dysphagia present, but she remains afebrile with lung sounds clear. Although it may not be a direct comparison, she also did cough when she aspirated the nectar thick liquids. Education was provided about ways to try to reduce the risk of aspiration as much as can be possible. As session progressed, pt started to have more delayed responses and more perseverative responses. Pt asked some repetitive questions, to which SLP provided reinforcement of responses. Her daughter perceived her to be getting tired, at which time PO trials were then also ceased. Will continue to follow acutely, recommending SLP f/u at SNF as well.    HPI HPI: Pt is a 88 year old female who presented with dysarthria, left facial droop, and  right gaze preference. TNK given.CT head negative for acute changes. CTA neck 11/21: Occlusion of the right internal carotid artery, ccclusion of the right PCA proximally with reconstitution of flow in the P2 segment, short-segment occlusion of a right M2 branch proximally and additional focal severe stenosis of an inferior right M2 segment  proximally. PMH: atrial fibrillation not on anticoagulation due to a previous bleeding left knee Bakers cyst but maintained on aspirin, right basal ganglia ischemic infarction in 2021 with resultant left upper extremity weakness and spasticity resulting in contracture as well as left lower extremity weakness. Pt seen for dysphagia in 2021 and was discharged on dysphagia 3 and thin liquids. Per pt's daughter, the pt subsequently received SLP services and was advanced to regular and thin liquids.      SLP Plan  Continue with current plan of care      Recommendations for follow up therapy are one component of a multi-disciplinary discharge planning process, led by the attending physician.  Recommendations may be updated based on patient status, additional functional criteria and insurance authorization.    Recommendations  Diet recommendations: Dysphagia 1 (puree);Honey-thick liquid Liquids provided via: Cup;No straw Medication Administration: Crushed with puree Supervision: Staff to assist with self feeding;Full supervision/cueing for compensatory strategies Compensations: Slow rate;Small sips/bites Postural Changes and/or Swallow Maneuvers: Seated upright 90 degrees                Oral Care Recommendations: Oral care BID Follow Up Recommendations: Skilled nursing-short term rehab (<3 hours/day) Assistance recommended at discharge: Frequent or constant Supervision/Assistance SLP Visit Diagnosis: Dysphagia, oropharyngeal phase (R13.12) Plan: Continue with current plan of care  Osie Bond., M.A. [place] Office [phone]  Secure chat preferred   10/02/2022, 2:44 PM

## 2022-10-02 NOTE — Care Management Important Message (Signed)
Important Message  Patient Details  Name: Susan Conway MRN: [id number] Date of Birth: Sep 03, 1929   Medicare Important Message Given:  Yes     Layonna Dobie [address] 10/02/2022, 3:19 PM

## 2022-10-02 NOTE — TOC Transition Note (Signed)
Transition of Care [facility]) - CM/SW Discharge Note   Patient Details  Name: Susan Conway MRN: [id number] Date of Birth: [birth year]  [facility] CM/SW Contact:  Jinger Neighbors, LCSW Phone Number: 10/02/2022, 4:35 PM   Clinical Narrative:    Pt to d/c back to [facility]. Spoke with daughter, [name] to provide update.   Call to report: [phone]  Going via [facility] to [place]   Final next level of care: Skilled Nursing Facility Barriers to Discharge: No Barriers Identified   Patient Goals and CMS Choice        Discharge Placement              Patient chooses bed at: Other - please specify in the comment section below: ([facility]) Patient to be transferred to facility by: [place] Name of family member notified: Maryjean Ka, Daughter Patient and family notified of of transfer: 10/02/22  Discharge Plan and Services                                     Social Determinants of Health (SDOH) Interventions     Readmission Risk Interventions     No data to display

## 2022-10-02 NOTE — Discharge Summary (Signed)
Stroke Discharge Summary  Patient ID: kinzleigh kandler   MRN: [id number]      DOB: [birth year]  Date of Admission: 09/26/2022 Date of Discharge: 10/02/2022  Attending Physician:  Stroke, Md, MD, Stroke MD Patient's PCP:  [name], MD  DISCHARGE DIAGNOSIS:  Principal Problem:   Stroke:  Right large PCA infarct s/p TNK, likely embolic given afib not on [facility]  Active Problems:   Hyperlipidemia   Atrial fibrillation ([place])   Essential hypertension   Dysphagia   Hx of stroke   Hx of bleeding   Fever    Leukocytosis    CAD   AKI   Allergies as of 10/02/2022       Reactions   Iodinated Contrast Media Nausea Only, Other (See Comments)   Severe nausea and also passed out   Iodine Nausea Only, Other (See Comments)   "Allergic," per [facility]- PASSED OUT   Lodine [etodolac] Other (See Comments)   "Allergic," per [facility]   Other Other (See Comments)   Seasonal Allergies   Bactrim Rash   Relafen [nabumetone] Rash   Sulfanilamide Rash        Medication List     STOP taking these medications    aspirin EC 81 MG tablet Replaced by: aspirin 81 MG chewable tablet   hydrALAZINE 10 MG tablet Commonly known as: APRESOLINE   hydrochlorothiazide 50 MG tablet Commonly known as: HYDRODIURIL   ondansetron 4 MG tablet Commonly known as: ZOFRAN       TAKE these medications    acetaminophen 325 MG tablet Commonly known as: TYLENOL Take 650 mg by mouth 3 (three) times daily as needed (for pain).   amLODipine 5 MG tablet Commonly known as: NORVASC Take 5 mg by mouth daily.   aspirin 81 MG chewable tablet Chew 1 tablet (81 mg total) by mouth daily. Start taking on: October 03, 2022 Replaces: aspirin EC 81 MG tablet   atenolol 50 MG tablet Commonly known as: TENORMIN Take 1 tablet (50 mg total) by mouth 2 (two) times daily. What changed:  when to take this additional instructions   atorvastatin 40 MG tablet Commonly known as: LIPITOR Take 1 tablet (40 mg total) by mouth  daily. Start taking on: October 03, 2022   CALCIUM 500 + D3 PO Take 1 capsule by mouth in the morning.   diclofenac Sodium 1 % Gel Commonly known as: VOLTAREN Apply 2 g topically as needed (BID PRN). What changed:  how much to take when to take this reasons to take this   digoxin 0.125 MG tablet Commonly known as: LANOXIN Take 1 tablet (0.125 mg total) by mouth every other day. Start taking on: October 03, 2022 What changed:  when to take this additional instructions   docusate sodium 100 MG capsule Commonly known as: Colace Take 1 capsule (100 mg total) by mouth daily. What changed: when to take this   levocetirizine 5 MG tablet Commonly known as: XYZAL Take 5 mg by mouth at bedtime.   lidocaine 5 % Commonly known as: LIDODERM Place 1 patch onto the skin as needed (for back pain- Remove & Discard patch within 12 hours or as directed by MD).   LORazepam 1 MG tablet Commonly known as: ATIVAN Take 1 tablet (1 mg total) by mouth at bedtime.   losartan 25 MG tablet Commonly known as: COZAAR Take 25 mg by mouth daily.   polyethylene glycol 17 g packet Commonly known as: MIRALAX / GLYCOLAX  Take 17 g by mouth See admin instructions. Mix 17 grams of powder into 4-6 oz of beverage of choice and drink once a day   potassium chloride SA 20 MEQ tablet Commonly known as: KLOR-CON M Take 10 mEq by mouth every evening.        LABORATORY STUDIES CBC    Component Value Date/Time   WBC 9.9 10/02/2022 0900   RBC 3.14 (L) 10/02/2022 0900   HGB 8.8 (L) 10/02/2022 0900   HGB 12.9 08/16/2018 1502   HCT 27.2 (L) 10/02/2022 0900   HCT 39.6 08/16/2018 1502   PLT 109 (L) 10/02/2022 0900   PLT 124 (L) 08/16/2018 1502   MCV 86.6 10/02/2022 0900   MCV 89 08/16/2018 1502   MCH 28.0 10/02/2022 0900   MCHC 32.4 10/02/2022 0900   RDW 14.6 10/02/2022 0900   RDW 12.3 08/16/2018 1502   LYMPHSABS 1.5 09/26/2022 1539   LYMPHSABS 2.5 08/16/2018 1502   MONOABS 2.4 (H) 09/26/2022  1539   EOSABS 0.1 09/26/2022 1539   EOSABS 0.0 08/16/2018 1502   BASOSABS 0.1 09/26/2022 1539   BASOSABS 0.1 08/16/2018 1502   CMP    Component Value Date/Time   NA 142 10/02/2022 0900   NA 136 (A) 08/01/2022 0000   K 3.9 10/02/2022 0900   CL 116 (H) 10/02/2022 0900   CO2 19 (L) 10/02/2022 0900   GLUCOSE 108 (H) 10/02/2022 0900   BUN 35 (H) 10/02/2022 0900   BUN 27 (A) 08/01/2022 0000   CREATININE 1.09 (H) 10/02/2022 0900   CALCIUM 8.2 (L) 10/02/2022 0900   PROT 7.1 09/26/2022 1539   ALBUMIN 3.1 (L) 09/26/2022 1539   AST 17 09/26/2022 1539   ALT 12 09/26/2022 1539   ALKPHOS 71 09/26/2022 1539   BILITOT 0.3 09/26/2022 1539   GFRNONAA 48 (L) 10/02/2022 0900   GFRAA 57.53 09/26/2021 0000   COAGS Lab Results  Component Value Date   INR 1.2 09/26/2022   INR 1.2 08/17/2020   INR 1.31 10/18/2015   Lipid Panel    Component Value Date/Time   CHOL 161 09/27/2022 0546   TRIG 39 09/27/2022 0546   HDL 39 (L) 09/27/2022 0546   CHOLHDL 4.1 09/27/2022 0546   VLDL 8 09/27/2022 0546   LDLCALC 114 (H) 09/27/2022 0546   HgbA1C  Lab Results  Component Value Date   HGBA1C 5.7 (H) 09/26/2022   Urinalysis    Component Value Date/Time   COLORURINE YELLOW 09/29/2022 1300   APPEARANCEUR HAZY (A) 09/29/2022 1300   LABSPEC >1.030 (H) 09/29/2022 1300   PHURINE 5.5 09/29/2022 1300   GLUCOSEU NEGATIVE 09/29/2022 1300   HGBUR SMALL (A) 09/29/2022 1300   BILIRUBINUR NEGATIVE 09/29/2022 1300   KETONESUR NEGATIVE 09/29/2022 1300   PROTEINUR 100 (A) 09/29/2022 1300   NITRITE NEGATIVE 09/29/2022 1300   LEUKOCYTESUR NEGATIVE 09/29/2022 1300   Urine Drug Screen No results found for: "LABOPIA", "COCAINSCRNUR", "LABBENZ", "AMPHETMU", "THCU", "LABBARB"  Alcohol Level    Component Value Date/Time   ETH <10 09/26/2022 1511     SIGNIFICANT DIAGNOSTIC STUDIES DG Swallowing Func-Speech Pathology  Result Date: 09/29/2022 Table formatting from the original result was not included.  Objective Swallowing Evaluation: Type of Study: MBS-Modified Barium Swallow Study  Patient Details Name: ANTANIYA VENUTI MRN: [id number] Date of Birth: [birth year] Today's Date: 09/29/2022 Time: SLP Start Time (ACUTE ONLY): 0836 -SLP Stop Time (ACUTE ONLY): 0901 SLP Time Calculation (min) (ACUTE ONLY): 25 min Past Medical History: Past Medical History: Diagnosis Date  Actinic  keratosis 05/25/2014  Anxiety   Atrial fibrillation ([place]) 09/21/2010  Basal cell carcinoma 05/25/2014  Multiple removed by Dr. Danella Sensing in March 2015: right neck, left neck, scalp   Bell's palsy 07/18/1979  Cervicalgia 01/31/2012  Closed fracture of lumbar vertebra without mention of spinal cord injury 07/17/2005  Conjunctiva disorder 12/26/2010  Coronary atherosclerosis of native coronary artery 07/17/1997  Cramp of limb 08/16/2009  Degeneration of lumbar or lumbosacral intervertebral disc 01/31/2012  Disturbance of skin sensation 08/2009  Dizziness and giddiness 11/08/2011  vertigo  External hemorrhoids without mention of complication 83/07/4075  External hemorrhoids without mention of complication 80/88/1103  Ganglion of tendon sheath 08/16/2009  Leg cramp 10/27/2013  Most frequently the right leg.   Long term (current) use of anticoagulants 09/2010  Lumbago 07/2009  Meralgia paresthetica 07/2007  MI, old   Myalgia and myositis, unspecified 01/17/2012  Osteoporosis   Other abnormal blood chemistry 04/08/2012  Other abnormal blood chemistry 2013  hyperglycemia  Other disorder of muscle, ligament, and fascia 04/08/2012  Other specified cardiac dysrhythmias(427.89) 06/27/2010  Pacemaker 05/06/2020  Pain in joint, ankle and foot 10/27/2013  Bilateral since 1998   Pain in joint, shoulder region 08/12/2012  Pain in joint, upper arm 12/26/2010  Pain in limb 01/11/2011  Pain in thoracic spine 01/31/2012  Pathologic fracture of vertebrae 01/22/2012  PVC's (premature ventricular contractions)   Rash and other nonspecific skin eruption 08/02/2011  Senile  osteoporosis 07/17/1993  Stroke ([place])   Unspecified essential hypertension 07/17/1997  Varicose veins of lower extremities 08/16/2009  Varicose veins of lower extremities 08/16/2009 Past Surgical History: Past Surgical History: Procedure Laterality Date  COLONOSCOPY WITH PROPOFOL N/A 10/19/2015  Procedure: COLONOSCOPY WITH PROPOFOL;  Surgeon: Gatha Mayer, MD;  Location: [facility];  Service: Endoscopy;  Laterality: N/A;  CORONARY ARTERY BYPASS GRAFT  1998  x2; LIMA to LAD; SVG to diagonal off bypass  HEMORRHOID SURGERY  08/26/2012  Dr. Brantley Stage  LOOP RECORDER INSERTION N/A 05/13/2018  Procedure: LOOP RECORDER INSERTION;  Surgeon: [name], MD;  Location: [place] [facility];  Service: Cardiovascular;  Laterality: N/A;  LOOP RECORDER REMOVAL N/A 05/26/2019  Procedure: LOOP RECORDER REMOVAL;  Surgeon: [name], MD;  Location: [place] [facility];  Service: Cardiovascular;  Laterality: N/A;  MASS EXCISION Left 11/23/2015  Procedure: LEFT WRIST EXCISION CYST;  Surgeon: Leanora Cover, MD;  Location: [place];  Service: Orthopedics;  Laterality: Left;  PACEMAKER IMPLANT N/A 05/26/2019  Procedure: PACEMAKER IMPLANT;  Surgeon: [name], MD;  Location: [place] [facility];  Service: Cardiovascular;  Laterality: N/A;  SKIN BIOPSY  01/29/14  (R) neck; (R) scalp, 2 (L) neck; shave biopsy Superficial basal cell carcinoma Dr. Danella Sensing  TONSILLECTOMY  [phone] HPI: Pt is a 88 year old female who presented with dysarthria, left facial droop, and right gaze preference. TNK given.CT head negative for acute changes. CTA neck 11/21: Occlusion of the right internal carotid artery, ccclusion of the right PCA proximally with reconstitution of flow in the P2 segment, short-segment occlusion of a right M2 branch proximally and additional focal severe stenosis of an inferior right M2 segment  proximally. PMH: atrial fibrillation not on anticoagulation due to a previous bleeding left knee Bakers cyst but  maintained on aspirin, right basal ganglia ischemic infarction in 2021 with resultant left upper extremity weakness and spasticity resulting in contracture as well as left lower extremity weakness. Pt seen for dysphagia in 2021 and was discharged on dysphagia 3 and thin liquids. Per pt's  daughter, the pt subsequently received SLP services and was advanced to regular and thin liquids.  Subjective: pt orally holding during MBS  Recommendations for follow up therapy are one component of a multi-disciplinary discharge planning process, led by the attending physician.  Recommendations may be updated based on patient status, additional functional criteria and insurance authorization. Assessment / Plan / Recommendation   09/29/2022   9:00 AM Clinical Impressions Clinical Impression MBS was completed in limited fashion given oral holding requiring suctioning of boluses from oral cavity. Daughter present and believes this may be related to taste and/or level of alertness. Oral deficits include reduced lingual manipulation and posterior propulsion. There is reduced bolus cohesion and it takes a long time and some verbal cueing to get the pt to swallow liquids. Oral residue is present with liquids as well. Pt does not swallow purees despite offering dry spoon for tactile cueing and liquid washes (would not open her mouth to accept a cup, took minimal liquid off the spoon), requiring yankauer to remove. Pharyngeal phase could not be viewed with purees. In swallowing a small amount of nectar thick liquids and purees by spoon, pt had aspiration before the swallow with nectar thick liquids with a weak, delayed cough that did not clear the airway. Suspect that this was a result of premature spillage as it occurred very quickly - before fluoro could be turned on, and while the remaining bolus was being held orally. Pt had small amounts of penetration with honey thick liquids but it all was ejected spontaneously (PAS 2). Discussed  with daughter the limitations of this study, but that we could try staying on Dys 1 diet (purees) since she has been swallowing some of this upstairs, as well as honey thick liquids. Would monitor closely as a team for any signs of intolerance. SLP Visit Diagnosis Dysphagia, oropharyngeal phase (R13.12) Impact on safety and function Moderate aspiration risk;Risk for inadequate nutrition/hydration     09/29/2022   9:00 AM Treatment Recommendations Treatment Recommendations Therapy as outlined in treatment plan below     09/29/2022   9:00 AM Prognosis Prognosis for Safe Diet Advancement Good Barriers to Reach Goals Cognitive deficits;Severity of deficits   09/29/2022   9:00 AM Diet Recommendations SLP Diet Recommendations Dysphagia 1 (Puree) solids;Honey thick liquids Liquid Administration via Cup;Spoon Medication Administration Crushed with puree Compensations Slow rate;Small sips/bites;Follow solids with liquid Postural Changes Seated upright at 90 degrees;Remain semi-upright after after feeds/meals (Comment)     09/29/2022   9:00 AM Other Recommendations Oral Care Recommendations Oral care BID Follow Up Recommendations Skilled nursing-short term rehab (<3 hours/day) Functional Status Assessment Patient has had a recent decline in their functional status and demonstrates the ability to make significant improvements in function in a reasonable and predictable amount of time.   09/29/2022   9:00 AM Frequency and Duration  Speech Therapy Frequency (ACUTE ONLY) min 2x/week Treatment Duration 2 weeks     09/29/2022   9:00 AM Oral Phase Oral Phase Impaired Oral - Honey Teaspoon Weak lingual manipulation;Reduced posterior propulsion;Decreased bolus cohesion;Lingual/palatal residue;Delayed oral transit Oral - Nectar Teaspoon Weak lingual manipulation;Reduced posterior propulsion;Decreased bolus cohesion;Lingual/palatal residue;Delayed oral transit Oral - Puree Holding of bolus;Other (Comment)    09/29/2022   9:00 AM  Pharyngeal Phase Pharyngeal Phase Impaired Pharyngeal- Honey Teaspoon Penetration/Aspiration before swallow;Reduced epiglottic inversion;Reduced tongue base retraction;Reduced airway/laryngeal closure;Pharyngeal residue - valleculae Pharyngeal Material enters airway, remains ABOVE vocal cords then ejected out Pharyngeal- Nectar Teaspoon Penetration/Aspiration before swallow;Reduced epiglottic inversion;Reduced tongue base retraction;Reduced  airway/laryngeal closure;Pharyngeal residue - valleculae Pharyngeal Material enters airway, passes BELOW cords and not ejected out despite cough attempt by patient    09/29/2022   9:00 AM Cervical Esophageal Phase  Cervical Esophageal Phase [facility] Osie Bond., M.A. [place] [facility] Office [phone] Secure chat preferred 09/29/2022, 9:48 AM                     DG CHEST PORT 1 VIEW  Result Date: 09/29/2022 CLINICAL DATA:  Aspiration into lower respiratory tract. EXAM: PORTABLE CHEST 1 VIEW COMPARISON:  08/25/2020. FINDINGS: The heart is enlarged and the mediastinal contour stable. Atherosclerotic calcification of the aorta is noted. No consolidation, effusion, or pneumothorax. No acute osseous abnormality. A single lead pacemaker device is present over the left chest. Sternotomy wires are present over the midline. IMPRESSION: 1. No active disease. 2. Cardiomegaly. Electronically Signed   By: Brett Fairy M.D.   On: 09/29/2022 01:05   ECHOCARDIOGRAM COMPLETE  Result Date: 09/27/2022    ECHOCARDIOGRAM REPORT   Patient Name:   ANIYIA RANE Date of Exam: 09/27/2022 Medical Rec #:  [id number]   Height:       66.0 in Accession #:    [id number]  Weight:       131.4 lb Date of Birth:  1929-03-20   BSA:          1.673 m Patient Age:    66 years    BP:           167/82 mmHg Patient Gender: F           HR:           68 bpm. Exam Location:  Inpatient Procedure: 2D Echo Indications:    stroke  History:        Patient has prior history of Echocardiogram examinations,  most                 recent 08/20/2020. CAD, Pacemaker, Arrythmias:PVC and Atrial                 Fibrillation; Risk Factors:Hypertension and Dyslipidemia.  Sonographer:    Johny Chess RDCS Referring Phys: [phone] MCNEILL P KIRKPATRICK  Sonographer Comments: Image acquisition challenging due to uncooperative patient. IMPRESSIONS  1. Left ventricular ejection fraction, by estimation, is 60 to 65%. The left ventricle has normal function. The left ventricle has no regional wall motion abnormalities. Left ventricular diastolic parameters are indeterminate.  2. Right ventricular systolic function is normal. The right ventricular size is normal. There is normal pulmonary artery systolic pressure.  3. Left atrial size was moderately dilated.  4. Right atrial size was severely dilated.  5. The mitral valve is degenerative. Trivial mitral valve regurgitation. No evidence of mitral stenosis.  6. Tricuspid valve regurgitation is moderate to severe.  7. The aortic valve is tricuspid. There is severe calcifcation of the aortic valve. Aortic valve regurgitation is not visualized. Aortic valve sclerosis/calcification is present, without any evidence of aortic stenosis. Comparison(s): No significant change from prior study. FINDINGS  Left Ventricle: Left ventricular ejection fraction, by estimation, is 60 to 65%. The left ventricle has normal function. The left ventricle has no regional wall motion abnormalities. The left ventricular internal cavity size was normal in size. There is  no left ventricular hypertrophy. Left ventricular diastolic parameters are indeterminate. Right Ventricle: The right ventricular size is normal. No increase in right ventricular wall thickness. Right ventricular systolic function is normal. There is normal pulmonary artery systolic  pressure. The tricuspid regurgitant velocity is 2.73 m/s, and  with an assumed right atrial pressure of 3 mmHg, the estimated right ventricular systolic pressure is 12.7  mmHg. Left Atrium: Left atrial size was moderately dilated. Right Atrium: Right atrial size was severely dilated. Pericardium: There is no evidence of pericardial effusion. Mitral Valve: The mitral valve is degenerative in appearance. Mild to moderate mitral annular calcification. Trivial mitral valve regurgitation. No evidence of mitral valve stenosis. Tricuspid Valve: The tricuspid valve is grossly normal. Tricuspid valve regurgitation is moderate to severe. Aortic Valve: The aortic valve is tricuspid. There is severe calcifcation of the aortic valve. Aortic valve regurgitation is not visualized. Aortic valve sclerosis/calcification is present, without any evidence of aortic stenosis. Pulmonic Valve: The pulmonic valve was normal in structure. Pulmonic valve regurgitation is trivial. No evidence of pulmonic stenosis. Aorta: The aortic root is normal in size and structure. IAS/Shunts: No atrial level shunt detected by color flow Doppler. Additional Comments: A device lead is visualized in the right ventricle and right atrium.  LEFT VENTRICLE PLAX 2D LVIDd:         3.90 cm LVIDs:         2.20 cm LV PW:         1.00 cm LV IVS:        0.90 cm LVOT diam:     1.60 cm LV SV:         28 LV SV Index:   17 LVOT Area:     2.01 cm  RIGHT VENTRICLE            IVC RV Basal diam:  2.90 cm    IVC diam: 1.80 cm RV S prime:     8.27 cm/s TAPSE (M-mode): 1.0 cm LEFT ATRIUM             Index        RIGHT ATRIUM           Index LA diam:        4.40 cm 2.63 cm/m   RA Area:     25.40 cm LA Vol (A2C):   69.8 ml 41.73 ml/m  RA Volume:   80.20 ml  47.94 ml/m LA Vol (A4C):   61.8 ml 36.94 ml/m LA Biplane Vol: 65.7 ml 39.28 ml/m  AORTIC VALVE LVOT Vmax:   75.50 cm/s LVOT Vmean:  48.400 cm/s LVOT VTI:    0.139 m  AORTA Ao Root diam: 2.90 cm Ao Asc diam:  3.10 cm TRICUSPID VALVE TR Peak grad:   29.8 mmHg TR Vmax:        273.00 cm/s  SHUNTS Systemic VTI:  0.14 m Systemic Diam: 1.60 cm Rudean Haskell MD Electronically signed by  Rudean Haskell MD Signature Date/Time: 09/27/2022/4:34:47 PM    Final    CT HEAD WO CONTRAST  Result Date: 09/27/2022 CLINICAL DATA:  Stroke, follow up EXAM: CT HEAD WITHOUT CONTRAST TECHNIQUE: Contiguous axial images were obtained from the base of the skull through the vertex without intravenous contrast. RADIATION DOSE REDUCTION: This exam was performed according to the departmental dose-optimization program which includes automated exposure control, adjustment of the mA and/or kV according to patient size and/or use of iterative reconstruction technique. COMPARISON:  November 21, 23. FINDINGS: Brain: Large acute right PCA territory infarct which involves the right thalamus held medial right temporal lobe, right hippocampus, and right occipital lobe. No evidence of acute hemorrhage, mass lesion or midline shift. No hydrocephalus. Vascular: Hyperdense right PCA. Skull: No acute  fracture. Sinuses/Orbits: Bilateral sphenoid sinus opacification. Other: No acute orbital findings. IMPRESSION: 1. Large acute right PCA territory infarct. 2. Hyperdense right PCA, compatible with known occlusion. These results will be called to the ordering clinician or representative by the Radiologist Assistant, and communication documented in the PACS or [employer]. Electronically Signed   By: Margaretha Sheffield M.D.   On: 09/27/2022 13:53   CT ANGIO HEAD NECK W WO CM (CODE STROKE)  Result Date: 09/26/2022 CLINICAL DATA:  Code stroke EXAM: CT ANGIOGRAPHY HEAD AND NECK TECHNIQUE: Multidetector CT imaging of the head and neck was performed using the standard protocol during bolus administration of intravenous contrast. Multiplanar CT image reconstructions and MIPs were obtained to evaluate the vascular anatomy. Carotid stenosis measurements (when applicable) are obtained utilizing NASCET criteria, using the distal internal carotid diameter as the denominator. RADIATION DOSE REDUCTION: This exam was performed according  to the departmental dose-optimization program which includes automated exposure control, adjustment of the mA and/or kV according to patient size and/or use of iterative reconstruction technique. CONTRAST:  2m OMNIPAQUE IOHEXOL 350 MG/ML SOLN COMPARISON:  Same-day noncontrast CT head, MR head and MRA head/neck 08/19/2020 FINDINGS: CTA NECK FINDINGS Aortic arch: There is calcified plaque in the imaged aortic arch. 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 The origins of the major branch vessels are patent with scattered soft and calcified plaque but no high-grade stenosis. Right carotid system: The right common carotid artery is patent but poorly opacified due to the downstream occlusion. The right internal carotid artery is occluded throughout its course. The external carotid artery is patent but poorly opacified. Left carotid system: The left common carotid artery is patent. There is calcified plaque in the proximal internal carotid artery resulting in up to approximately 50% stenosis. The distal internal carotid artery is patent but tortuous in the neck. The external carotid artery is patent. There is no dissection or aneurysm. Vertebral arteries: There is severe stenosis of the origin of the right vertebral artery and calcified plaque in the V1 segment resulting in mild stenosis. There is additional calcified plaque in the V3 segment resulting in mild-to-moderate stenosis. The left vertebral artery is patent without hemodynamically significant stenosis or occlusion in the neck. There is no evidence of dissection or aneurysm. Skeleton: There is multilevel degenerative change of the cervical spine. There is no acute osseous abnormality or suspicious osseous lesion. There is no visible canal hematoma. Other neck:  The soft tissues are unremarkable. Upper chest: There is scarring in the lung apices. Review of the MIP images confirms the above findings CTA HEAD FINDINGS Anterior circulation: The right ICA is occluded throughout its course with reconstitution of flow at the M1 segment. There is short-segment occlusion of a right M2 branch proximally (8-108, 7-91). There is additional focal severe stenosis of an inferior right M2 segment proximally (7-96). There is atherosclerotic irregularity and narrowing in the remainder of the right MCA branches. There is calcified plaque in the left intracranial ICA resulting in up to mild-to-moderate stenosis of the supraclinoid segment. There is mild stenosis of the left M1 segment. The distal left MCA branches appear patent without proximal high-grade stenosis or occlusion. The bilateral ACAs are patent with focal severe stenosis of the left A2 segment (7-67). There is no other proximal high-grade stenosis or occlusion.  There is no aneurysm or AVM. Posterior circulation: There is plaque in the right V4 segment resulting in severe stenosis proximally. There is critical stenosis/occlusion of the left vertebral artery at the V3/V4 junction with diminutive caliber of the remainder of the V4 segment. The basilar artery is patent with mild irregularity. The major cerebellar arteries appear patent. There is a fetal origin of the left PCA the left PCA appears patent, with mild-to-moderate stenosis of the proximal P2 segment (100-152). The right PCA is not identified proximally there is reconstitution of flow in the P2 segment (100-161). Note that the right PCA is also fetal in origin and primarily supplied by a now occluded posterior communicating artery. Venous sinuses: Suboptimally evaluated due to bolus timing. Anatomic variants: As above. Review of the MIP images confirms the above findings IMPRESSION: 1. Occlusion of the right internal carotid artery throughout its course in the neck with  reconstitution of flow at the M1 segment, age indeterminate but new since 2021. 2. Short-segment occlusion of a right M2 branch proximally and additional focal severe stenosis of an inferior right M2 segment proximally. 3. Occlusion of the right PCA proximally with reconstitution of flow in the P2 segment. Note that the right PCA is also fetal in origin and primarily supplied by a now occluded posterior communicating artery. 4. Additional intracranial atherosclerotic disease resulting in severe stenosis of the left A2 segment, severe stenosis of the right V4 segment, and critical stenosis/occlusion of the left vertebral artery at the V3/V4 junction with diminutive caliber of the remainder of the V4 segment. 5. Calcified plaque in the left carotid bifurcation resulting in approximately 50% stenosis. 6. Severe stenosis at the origin of the right vertebral artery. 7. 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 Recommend vascular surgery referral. Findings were communicated with Dr. Leonel Ramsay at 3:55 p.m. Electronically Signed   By: Valetta Mole M.D.   On: 09/26/2022 16:02   CT HEAD CODE STROKE WO CONTRAST  Addendum Date: 09/26/2022   ADDENDUM REPORT: 09/26/2022 15:57 ADDENDUM: Impression #2 should state chronic infract in the periventricular white matter/right corona radiata Electronically Signed   By: Marin Roberts M.D.   On: 09/26/2022 15:57   Result Date: 09/26/2022 CLINICAL DATA:  Code stroke. EXAM: CT HEAD WITHOUT CONTRAST TECHNIQUE: Contiguous axial images were obtained from the base of the skull through the vertex without intravenous contrast. RADIATION DOSE REDUCTION: This exam was performed according to the departmental dose-optimization program which includes automated exposure control, adjustment of the mA and/or kV according to patient size and/or use of iterative  reconstruction technique. COMPARISON:  08/17/20 CT brain, 08/19/20 MRI brain FINDINGS: Brain: No evidence of acute infarction, hemorrhage, hydrocephalus, extra-axial collection or mass lesion/mass effect. Redemonstrated generalized volume loss with sequela of moderate to severe chronic microvascular ischemic change. Redemonstrated area of acute to subacute infarct in the periventricular white matter/right corona radiata (series 100, image 26). Vascular: No hyperdense vessel or unexpected calcification. Skull: Normal. Negative for fracture or focal lesion. Sinuses/Orbits: Bilateral lens replacements. Paranasal sinuses are clear. Other: None. ASPECTS ([facility] Stroke Program Early CT Score): 10 IMPRESSION: 1. No hemorrhage or definitive CT evidence of a new infarct. 2. Redemonstrated area of acute to subacute infarct in the periventricular white matter/right corona radiata. Findings were paged to Dr. Leonel Ramsay on 09/26/22 at 3:31 PM. Electronically Signed: By: Marin Roberts M.D. On: 09/26/2022 15:31  HISTORY OF PRESENT ILLNESS  Ms. KRYSTIE LEITER is a 88 y.o. female with history of atrial fibrillation not on anticoagulation due to a previous bleeding but maintained on aspirin, pacemaker placement, right basal ganglia ischemic infarction in 2021 with resultant left upper extremity weakness and spasticity resulting in contracture as well as left lower extremity weakness. She is presented with dysarthria, left facial droop and a right gaze preference. She is from [facility].  She could not tolerate MRI so repeat CT was ordered which showed large stroke.   HOSPITAL COURSE Stroke:  Right large PCA infarct s/p TNK, likely embolic given afib not on AC Code Stroke CT head - area of acute to subacute infarct in the periventricular white matter/right corona radiata.  CTA head & neck - Occlusion of the right ICA with reconstitution of flow at the M1 segment, age indeterminate but new since 2021. Short-segment  occlusion of a right M2 branch proximally and additional focal severe stenosis of an inferior right M2 segment proximally. Occlusion of the right PCA proximally with reconstitution of flow in the P2 segment. Note that the right PCA is also fetal in origin and primarily supplied by a now occluded posterior communicating artery. Severe stenosis of the left A2, right V1 and V4 and left V3/V4 junction. Calcified plaque in the left carotid bifurcation resulting in approximately 50% stenosis.  MRI  Unable to tolerate (lying flat) due to back pain CT Head Repeat-  Large acute right PCA territory infarct. Hyperdense right PCA, compatible with known occlusion. 2D Echo 60 to 65%.  Left atrial enlargement to moderate degree. LDL 114 HgbA1c 5.7  Atrial fibrillation CHADS2VASC Score 7 (HTN, Age, prev Stroke, gender, vascular disease) stroke risk is 11.2% She was previously on [facility] but was taken off do to a large bleeding cyst. Sees Dr. Lovena Le outpatient, last seen 10/10/2021 where he did recommend anticoagulation, but family was hesitant Rate control with atenolol and digoxin Daughter would like to continue ASA for now and will follow up with Dr. Lovena Le to discuss about [facility]   History of stroke 08/2020 admitted for a fall with pelvic fracture and large retroperitoneal hemorrhage.  But developed left-sided weakness.  CT no acute abnormality.  MRI showed right BG infarct, likely due to A-fib not on AC.  MRA head and neck showed right M2 occlusion with subsequent reconstitution.  LDL 81.3, EF 60 to 65%.  A1c 5.7.  Discharged on aspirin 81.   History of bleeding History of GI bleeding on Coumadin History of Baker's cyst hemorrhage on Eliquis 08/2020 admitted for a fall with pelvic fracture and large retroperitoneal hemorrhage.   Not on anticoagulation PTA, but on aspirin 81.   Hypertension Home meds:  Norvasc 5, atenolol 50, hydralazine, hydrochlorothiazide, losartan Stable BP systolic <782 Long-term BP goal  normotensive   Hyperlipidemia Home meds:  None LDL 114, goal < 70 Add atorvastatin 40 Continue statin at discharge   Fever -resolved Leukocytosis -resolved   Dysphagia Speech on board on dysphagia 1 and honey thick liquid Aspiration precaution   Other Stroke Risk Factors Advanced Age >/= 65  Coronary artery disease   Other Active Problems AKI, creatinine 1.40-1.34-1.25-1.21->1.09 Mild thrombocytopenia platelet 151->99-> 87->109 Anemia, hemoglobin 9.2->8.5-8.8  RN Pressure Injury Documentation: Pressure Injury 08/18/20 Knee Left;Posterior Stage 1 -  Intact skin with non-blanchable redness of a localized area usually over a bony prominence. (Active)  08/18/20 0200  Location: Knee  Location Orientation: Left;Posterior  Staging: Stage 1 -  Intact skin with non-blanchable  redness of a localized area usually over a bony prominence.  Wound Description (Comments):   Present on Admission: Yes     DISCHARGE EXAM Blood pressure (!) 168/82, pulse 71, temperature (!) 97.5 F (36.4 C), temperature source Oral, resp. rate 20, weight 59.6 kg, SpO2 98 %.  General - Well nourished, well developed, in no apparent distress.   Ophthalmologic - fundi not visualized due to noncooperation.   Cardiovascular - irregularly irregular heart rate and rhythm.   Neuro - awake, alert, lethargic but eyes open, orientated to age, place, time and people, however, slow responding with psychomotor slowing and moderate dysarthria. No aphasia, speaks slow but following all simple commands. Able to name and repeat. Right gaze preference and not cross midline, tracking on the right, visual field deficit on the left. Left facial droop. Tongue midline. RUE 3-4/5 no drift, LUE flaccid. RLE at least 3/5, LLE flicker movement as requested. Sensation exam not quite cooperative, right FTN intact grossly but slow, gait not tested  Discharge Diet       Diet   DIET - DYS 1 Room service appropriate? No; Fluid  consistency: Honey Thick   liquids  DISCHARGE PLAN Disposition:  SNF aspirin 81 mg daily for secondary stroke prevention Ongoing stroke risk factor control by Primary Care Physician at time of discharge Follow-up PCP [name], MD in 2 weeks. Follow up with Dr. Lovena Le cardiology regarding further medication decision for antithrombotics Follow-up in [facility] in 8 weeks, office to schedule an appointment.   45 minutes were spent preparing discharge.   Beulah Gandy DNP, ACNPC-AG  [facility] Pager# [phone]   ATTENDING NOTE: I reviewed above note and agree with the assessment and plan. Pt was seen and examined.   Pt daughter at the bedside. She is AAO x3, neuro stable. Still has left hemianopia and left hemiplegia and right gaze. No fever. On ASA. She will follow up with Dr Lovena Le for further antithrombotic decisions. Follow up at stroke clinic at [facility] in 4-6 weeks. She is ready for discharge back to [facility].   For detailed assessment and plan, please refer to above/below as I have made changes wherever appropriate.   Rosalin Hawking, MD PhD Stroke Neurology 10/02/2022 4:52 PM

## 2022-10-02 NOTE — Care Management Important Message (Signed)
Important Message  Patient Details  Name: Susan Conway MRN: [id number] Date of Birth: [birth year]   Medicare Important Message Given:  Yes     Orbie Pyo 10/02/2022, 3:21 PM

## 2022-10-02 NOTE — Progress Notes (Signed)
Occupational Therapy Treatment Patient Details Name: Susan Conway MRN: [id number] DOB: 1929/08/20 Today's Date: 10/02/2022   History of present illness 88 y.o. female presents to [facility] hospital on 09/26/2022 with dysarthria, L facial droop, R gaze. CTA revealed multifocal occlusions, pt received TNK. PMH includes R basal ganglia CVA, L knee bakers cyst, afib, MI, pacemaker, anxiety.   OT comments  Pt making slow but steady progress toward feeding goal. Pt with very significant L neglect affecting ability to find items on tray and complete feeding tasks without mod to max cuing. When given an extended amount of time, pt was able to feed self a few bites of food with max VCs for vision. Pt also, given extra time, answered most questions appropriately. Pt with very slow processing but when given the time, works very hard to complete tasks.  Will continue to see with focus on feeding and more OOB activity.   Recommendations for follow up therapy are one component of a multi-disciplinary discharge planning process, led by the attending physician.  Recommendations may be updated based on patient status, additional functional criteria and insurance authorization.    Follow Up Recommendations  Skilled nursing-short term rehab (<3 hours/day)     Assistance Recommended at Discharge Intermittent Supervision/Assistance  Patient can return home with the following  Two people to help with walking and/or transfers;Two people to help with bathing/dressing/bathroom   Equipment Recommendations  Wheelchair cushion (measurements OT);Wheelchair (measurements OT);Hospital bed    Recommendations for Other Services      Precautions / Restrictions Precautions Precautions: Fall Precaution Comments: L neglect Restrictions Weight Bearing Restrictions: No       Mobility Bed Mobility               General bed mobility comments: worked on feeding and grooming at bed level    Transfers                    General transfer comment: defer to next session     Balance                                           ADL either performed or assessed with clinical judgement   ADL Overall ADL's : Needs assistance/impaired Eating/Feeding: Maximal assistance;Sitting Eating/Feeding Details (indicate cue type and reason): Pt fed self appx 15 bites of food with max assist and hand over hand for drinking out of a cup and max assist hand over hand for spoon use up to min assist by end of session. Pt limited most by visual/perceptual deficits when attempting to locate items on tray and plate.  Spoke to daughter about how daughter can assist pt with feeding and how to assist with occasionally addressing pt from the L at attempt to encourage pt to look that direction. Grooming: Minimal assistance;Bed level Grooming Details (indicate cue type and reason): min assist when doing more than wiping face. Max cues for vision to find grooming items.                             Functional mobility during ADLs: Maximal assistance General ADL Comments: Pt very limited with adls due to visual deficits.    Extremity/Trunk Assessment Upper Extremity Assessment Upper Extremity Assessment: RUE deficits/detail;LUE deficits/detail RUE Deficits / Details: wfl RUE Sensation: WNL RUE Coordination: WNL LUE Deficits /  Details: Due to pts severe L neglect, pt not moving L side as well as she was prior to this stoke.   Lower Extremity Assessment Lower Extremity Assessment: Defer to PT evaluation        Vision   Vision Assessment?: Yes Eye Alignment: Within Functional Limits Ocular Range of Motion: Restricted on the left Alignment/Gaze Preference: Gaze right Tracking/Visual Pursuits: Requires cues, head turns, or add eye shifts to track;Impaired - to be further tested in functional context Depth Perception: Overshoots Additional Comments: Pt with severe L negect   Perception  Perception Perception: Impaired   Praxis Praxis Praxis: Not tested    Cognition Arousal/Alertness: Awake/alert Behavior During Therapy: Flat affect Overall Cognitive Status: Difficult to assess Area of Impairment: Orientation, Attention, Following commands, Safety/judgement, Awareness, Problem solving                 Orientation Level: Disoriented to, Time Current Attention Level: Sustained   Following Commands: Follows one step commands inconsistently, Follows one step commands with increased time   Awareness: Emergent Problem Solving: Slow processing, Decreased initiation, Requires verbal cues General Comments: Pt has very slow processing speed with speech and activity but appears to be accuarate and insightful to deficits this am. Daughter is very willing to learn and accepted feedback well.        Exercises      Shoulder Instructions       General Comments Pt making slow progress with cognition and visual perceptual skills related to adls.    Pertinent Vitals/ Pain       Pain Assessment Pain Assessment: No/denies pain  Home Living                                          Prior Functioning/Environment              Frequency  Min 2X/week        Progress Toward Goals  OT Goals(current goals can now be found in the care plan section)  Progress towards OT goals: Progressing toward goals  Acute Rehab OT Goals Patient Stated Goal: to be more independent OT Goal Formulation: With patient/family Time For Goal Achievement: 10/11/22 Potential to Achieve Goals: Fair ADL Goals Pt Will Perform Eating: with min guard assist;with adaptive utensils;sitting Pt Will Perform Grooming: with supervision;sitting Additional ADL Goal #1: pt will tolerate eob sitting min (A) as precursor to w/c Additional ADL Goal #2: pt will complete sit<>Stand total +2 max (A) with sara stedy  Plan Discharge plan remains appropriate    Co-evaluation                  AM-PAC OT "6 Clicks" Daily Activity     Outcome Measure   Help from another person eating meals?: A Lot Help from another person taking care of personal grooming?: A Lot Help from another person toileting, which includes using toliet, bedpan, or urinal?: Total Help from another person bathing (including washing, rinsing, drying)?: Total Help from another person to put on and taking off regular upper body clothing?: A Lot Help from another person to put on and taking off regular lower body clothing?: Total 6 Click Score: 9    End of Session    OT Visit Diagnosis: Unsteadiness on feet (R26.81);Muscle weakness (generalized) (M62.81);Hemiplegia and hemiparesis Hemiplegia - Right/Left: Left Hemiplegia - dominant/non-dominant: Non-Dominant Hemiplegia - caused by: Cerebral infarction  Activity Tolerance Patient tolerated treatment well   Patient Left in bed;with call bell/phone within reach;with family/visitor present   Nurse Communication Mobility status;Precautions        Time: 6773-7366 OT Time Calculation (min): 45 min  Charges: OT General Charges $OT Visit: 1 Visit OT Treatments $Self Care/Home Management : 38-52 mins   Glenford Peers 10/02/2022, 10:14 AM

## 2022-10-03 ENCOUNTER — Encounter: Payer: Self-pay | Admitting: Orthopedic Surgery

## 2022-10-03 ENCOUNTER — Non-Acute Institutional Stay (SKILLED_NURSING_FACILITY): Payer: Medicare Other | Admitting: Orthopedic Surgery

## 2022-10-03 DIAGNOSIS — I69354 Hemiplegia and hemiparesis following cerebral infarction affecting left non-dominant side: Secondary | ICD-10-CM

## 2022-10-03 DIAGNOSIS — I6621 Occlusion and stenosis of right posterior cerebral artery: Secondary | ICD-10-CM | POA: Diagnosis not present

## 2022-10-03 DIAGNOSIS — M62562 Muscle wasting and atrophy, not elsewhere classified, left lower leg: Secondary | ICD-10-CM | POA: Diagnosis not present

## 2022-10-03 DIAGNOSIS — N1831 Chronic kidney disease, stage 3a: Secondary | ICD-10-CM | POA: Diagnosis not present

## 2022-10-03 DIAGNOSIS — R278 Other lack of coordination: Secondary | ICD-10-CM | POA: Diagnosis not present

## 2022-10-03 DIAGNOSIS — E782 Mixed hyperlipidemia: Secondary | ICD-10-CM | POA: Diagnosis not present

## 2022-10-03 DIAGNOSIS — R1310 Dysphagia, unspecified: Secondary | ICD-10-CM

## 2022-10-03 DIAGNOSIS — M62561 Muscle wasting and atrophy, not elsewhere classified, right lower leg: Secondary | ICD-10-CM | POA: Diagnosis not present

## 2022-10-03 DIAGNOSIS — I4821 Permanent atrial fibrillation: Secondary | ICD-10-CM | POA: Diagnosis not present

## 2022-10-03 DIAGNOSIS — I6521 Occlusion and stenosis of right carotid artery: Secondary | ICD-10-CM | POA: Diagnosis not present

## 2022-10-03 DIAGNOSIS — D696 Thrombocytopenia, unspecified: Secondary | ICD-10-CM | POA: Diagnosis not present

## 2022-10-03 DIAGNOSIS — I69322 Dysarthria following cerebral infarction: Secondary | ICD-10-CM | POA: Diagnosis not present

## 2022-10-03 DIAGNOSIS — I10 Essential (primary) hypertension: Secondary | ICD-10-CM | POA: Diagnosis not present

## 2022-10-03 DIAGNOSIS — I69391 Dysphagia following cerebral infarction: Secondary | ICD-10-CM | POA: Diagnosis not present

## 2022-10-03 DIAGNOSIS — D631 Anemia in chronic kidney disease: Secondary | ICD-10-CM | POA: Diagnosis not present

## 2022-10-03 DIAGNOSIS — R2689 Other abnormalities of gait and mobility: Secondary | ICD-10-CM | POA: Diagnosis not present

## 2022-10-03 DIAGNOSIS — R1312 Dysphagia, oropharyngeal phase: Secondary | ICD-10-CM | POA: Diagnosis not present

## 2022-10-03 NOTE — Progress Notes (Signed)
Location:  [name] Room Number: 118/A Place of Service:  SNF [phone]) Provider:  Yvonna Alanis, NP   [name], MD  Patient Care Team: [name], MD as PCP - General (Internal Medicine) [name], MD as PCP - Electrophysiology (Cardiology) [name], MD as Consulting Physician (Dermatology) [name], MD as Consulting Physician (Cardiology) [facility] [name], MD as Consulting Physician (Dermatology)  Extended Emergency Contact Information Primary Emergency Contact: [name] of [place] Phone: [phone] Mobile Phone: [phone] Relation: Daughter Secondary Emergency Contact: Terrilyn Saver of [place] Phone: [phone] Mobile Phone: [phone] Relation: Son  Code Status:  DNR Goals of care: Advanced Directive information    10/03/2022   10:50 AM  Advanced Directives  Does Patient Have a Medical Advance Directive? Yes  Type of Paramedic of [place];Living will;Out of facility DNR (pink MOST or yellow form)  Does patient want to make changes to medical advance directive? No - Patient declined  Pre-existing out of facility DNR order (yellow form or pink MOST form) Pink Most/Yellow Form available - Physician notified to receive inpatient order;Yellow form placed in chart (order not valid for inpatient use)     Chief Complaint  Patient presents with   Hospitalization Follow-up    Hospital visit of September 26, 2022 due to an acute ischemic stroke.   Quality Metric Gaps    To Discuss the following as listed to the Care Gaps as listed below.     HPI:  Pt is a 88 y.o. female seen today for f/u s/p hospitalization 11/21- 11/27 at [facility].   She currently resides on the skilled nursing unit at [facility]. Past medical history includes: atrial fibrillation, AV block, CAD, hypertension, GERD, stroke, left sided  paralysis, constipation, and dysphagia.     11/21 she was observed with slowed/slurred speech, left facial droop, drooling and right sided gaze. She was sent to ED for evaluation. Code stroke initiated. CT head revealed acute to subacute infarct in the periventricular white matter/right corona radiata. CTA head/neck revealed occlusion to right ICA with reconstitution of flow at M1 segment. MRI brain unable to be done. Repeat CT head revealed large acute right PCA territory infarct, hyperdense right PCA. Stroke thought to be embolic since she has afib and not on anticoagulants. She was given TNK. H/o GI bleed on coumadin, and ruptured Bakers Cyst on Eliquis. She remains on aspirin per daughter request. Plan to discuss anticoagulation with Dr. Lovena Le outpatient. 2D echo- LVEF 60-65%. Blood pressures stable during hospitalization. No changes to bp medications. Goal SBP < 180. LDL 114, goal < 70. She was started on atorvastatin. She is now on dysphagia 1 with honey liquids. She was discharged back to SNF at [facility].   Today, she denies pain. Speech/responsiveness is slowed. She responds with "yes/no" phrases during encounter. She still has right sided eye gaze. She has been evaluated by ST, remains on DYS1 diet with honey think liquids. Afebrile, vitats stable.   Past Medical History:  Diagnosis Date   Actinic keratosis 05/25/2014   Anxiety    Atrial fibrillation ([place]) 09/21/2010   Basal cell carcinoma 05/25/2014   Multiple removed by Dr. Danella Sensing in March 2015: right neck, left neck, scalp    Bell's palsy 07/18/1979   Cervicalgia 01/31/2012   Closed fracture of lumbar vertebra without mention of spinal cord injury 07/17/2005   Conjunctiva disorder 12/26/2010   Coronary  atherosclerosis of native coronary artery 07/17/1997   Cramp of limb 08/16/2009   Degeneration of lumbar or lumbosacral intervertebral disc 01/31/2012   Disturbance of skin sensation 08/2009   Dizziness and giddiness  11/08/2011   vertigo   External hemorrhoids without mention of complication 62/22/9798   External hemorrhoids without mention of complication 92/09/9416   Ganglion of tendon sheath 08/16/2009   Leg cramp 10/27/2013   Most frequently the right leg.    Long term (current) use of anticoagulants 09/2010   Lumbago 07/2009   Meralgia paresthetica 07/2007   MI, old    Myalgia and myositis, unspecified 01/17/2012   Osteoporosis    Other abnormal blood chemistry 04/08/2012   Other abnormal blood chemistry 2013   hyperglycemia   Other disorder of muscle, ligament, and fascia 04/08/2012   Other specified cardiac dysrhythmias(427.89) 06/27/2010   Pacemaker 05/06/2020   Pain in joint, ankle and foot 10/27/2013   Bilateral since 1998    Pain in joint, shoulder region 08/12/2012   Pain in joint, upper arm 12/26/2010   Pain in limb 01/11/2011   Pain in thoracic spine 01/31/2012   Pathologic fracture of vertebrae 01/22/2012   PVC's (premature ventricular contractions)    Rash and other nonspecific skin eruption 08/02/2011   Senile osteoporosis 07/17/1993   Stroke ([place])    Unspecified essential hypertension 07/17/1997   Varicose veins of lower extremities 08/16/2009   Varicose veins of lower extremities 08/16/2009   Past Surgical History:  Procedure Laterality Date   COLONOSCOPY WITH PROPOFOL N/A 10/19/2015   Procedure: COLONOSCOPY WITH PROPOFOL;  Surgeon: Gatha Mayer, MD;  Location: [facility];  Service: Endoscopy;  Laterality: N/A;   CORONARY ARTERY BYPASS GRAFT  1998   x2; LIMA to LAD; SVG to diagonal off bypass   HEMORRHOID SURGERY  08/26/2012   Dr. Brantley Stage   LOOP RECORDER INSERTION N/A 05/13/2018   Procedure: LOOP RECORDER INSERTION;  Surgeon: [name], MD;  Location: [facility];  Service: Cardiovascular;  Laterality: N/A;   LOOP RECORDER REMOVAL N/A 05/26/2019   Procedure: LOOP RECORDER REMOVAL;  Surgeon: [name], MD;  Location: [facility];  Service:  Cardiovascular;  Laterality: N/A;   MASS EXCISION Left 11/23/2015   Procedure: LEFT WRIST EXCISION CYST;  Surgeon: Leanora Cover, MD;  Location: [facility];  Service: Orthopedics;  Laterality: Left;   PACEMAKER IMPLANT N/A 05/26/2019   Procedure: PACEMAKER IMPLANT;  Surgeon: [name], MD;  Location: [facility];  Service: Cardiovascular;  Laterality: N/A;   SKIN BIOPSY  01/29/14   (R) neck; (R) scalp, 2 (L) neck; shave biopsy Superficial basal cell carcinoma Dr. Danella Sensing   TONSILLECTOMY  [phone]    Allergies  Allergen Reactions   Iodinated Contrast Media Nausea Only and Other (See Comments)    Severe nausea and also passed out   Iodine Nausea Only and Other (See Comments)    "Allergic," per [facility]- PASSED OUT   Lodine [Etodolac] Other (See Comments)    "Allergic," per [facility]   Other Other (See Comments)    Seasonal Allergies   Bactrim Rash   Relafen [Nabumetone] Rash   Sulfanilamide Rash    Outpatient Encounter Medications as of 10/03/2022  Medication Sig   acetaminophen (TYLENOL) 325 MG tablet Take 650 mg by mouth 3 (three) times daily as needed (for pain).   amLODipine (NORVASC) 5 MG tablet Take 5 mg by mouth daily.   aspirin 81 MG chewable tablet Chew  1 tablet (81 mg total) by mouth daily.   atenolol (TENORMIN) 50 MG tablet Take 1 tablet (50 mg total) by mouth 2 (two) times daily.   atorvastatin (LIPITOR) 40 MG tablet Take 1 tablet (40 mg total) by mouth daily.   Calcium Carb-Cholecalciferol (CALCIUM 500 + D3 PO) Take 1 capsule by mouth in the morning.   diclofenac Sodium (VOLTAREN) 1 % GEL Apply 2 g topically as needed (BID PRN).   digoxin (LANOXIN) 0.125 MG tablet Take 1 tablet (0.125 mg total) by mouth every other day.   docusate sodium (COLACE) 100 MG capsule Take 1 capsule (100 mg total) by mouth daily. (Patient taking differently: Take 100 mg by mouth every evening.)   levocetirizine (XYZAL) 5 MG tablet Take 5 mg by mouth at bedtime.   lidocaine  (LIDODERM) 5 % Place 1 patch onto the skin as needed (for back pain- Remove & Discard patch within 12 hours or as directed by MD).   LORazepam (ATIVAN) 1 MG tablet Take 1 tablet (1 mg total) by mouth at bedtime.   losartan (COZAAR) 25 MG tablet Take 25 mg by mouth daily.   polyethylene glycol (MIRALAX / GLYCOLAX) 17 g packet Take 17 g by mouth See admin instructions. Mix 17 grams of powder into 4-6 oz of beverage of choice and drink once a day   potassium chloride SA (KLOR-CON) 20 MEQ tablet Take 10 mEq by mouth every evening.   No facility-administered encounter medications on file as of 10/03/2022.    Review of Systems  Unable to perform ROS: Mental status change    Immunization History  Administered Date(s) Administered   Influenza Whole 08/06/2012   Influenza, High Dose Seasonal PF 08/15/2019, 08/27/2020   Influenza,inj,Quad PF,6+ Mos 08/30/2018   Influenza-Unspecified 08/06/2013, 08/24/2014, 08/26/2015, 08/31/2016, 08/27/2017, 08/27/2020   Moderna Sars-Covid-2 Vaccination 11/16/2019, 12/16/2019, 09/16/2020   Pneumococcal Conjugate-13 09/22/2015   Pneumococcal Polysaccharide-23 08/29/2006   Td 10/07/2007   Zoster Recombinat (Shingrix) 02/12/2018, 05/13/2018   Zoster, Live 03/12/2008   Pertinent  Health Maintenance Due  Topic Date Due   INFLUENZA VACCINE  06/06/2022   DEXA SCAN  Completed      09/30/2022    8:00 PM 10/01/2022    8:00 AM 10/01/2022    8:00 PM 10/02/2022    8:00 AM 10/03/2022   10:49 AM  Fall Risk  Falls in the past year?     1  Was there an injury with Fall?     1  Fall Risk Category Calculator     3  Fall Risk Category     High  Patient Fall Risk Level High fall risk High fall risk High fall risk High fall risk High fall risk  Patient at Risk for Falls Due to     History of fall(s)  Fall risk Follow up     Falls evaluation completed   Functional Status Survey:    Vitals:   10/03/22 1044  BP: (!) 146/79  Pulse: 61  Resp: 17  Temp: (!) 97.5 F  (36.4 C)  SpO2: 97%  Weight: 130 lb (59 kg)  Height: '5\' 6"'$  (1.676 m)   Body mass index is 20.98 kg/m. Physical Exam Vitals reviewed.  Constitutional:      General: She is not in acute distress. HENT:     Head: Normocephalic.     Right Ear: There is no impacted cerumen.     Left Ear: There is no impacted cerumen.     Nose: Nose normal.  Mouth/Throat:     Mouth: Mucous membranes are moist.  Eyes:     General:        Right eye: No discharge.        Left eye: No discharge.     Comments: Right sided eye gaze  Cardiovascular:     Rate and Rhythm: Normal rate. Rhythm irregular.     Pulses: Normal pulses.     Heart sounds: Normal heart sounds.  Pulmonary:     Effort: Pulmonary effort is normal. No respiratory distress.     Breath sounds: Normal breath sounds. No wheezing.  Chest:     Comments: pacemaker Abdominal:     General: Bowel sounds are normal. There is no distension.     Palpations: Abdomen is soft.     Tenderness: There is no abdominal tenderness.  Musculoskeletal:     Cervical back: Neck supple.     Right lower leg: No edema.     Left lower leg: No edema.  Skin:    General: Skin is warm and dry.     Capillary Refill: Capillary refill takes less than 2 seconds.     Comments: Bruising to right arm and middle chest, no skin breakdown  Neurological:     General: No focal deficit present.     Mental Status: She is alert. Mental status is at baseline.     Motor: Weakness present.     Gait: Gait abnormal.     Comments: Left sided hemiparesis, left sided facial droop, slowed/slurred speech  Psychiatric:        Mood and Affect: Mood normal.        Behavior: Behavior normal.     Labs reviewed: Recent Labs    09/30/22 0931 10/01/22 0806 10/02/22 0900  NA 141 142 142  K 3.6 3.3* 3.9  CL 108 111 116*  CO2 22 20* 19*  GLUCOSE 107* 114* 108*  BUN 34* 36* 35*  CREATININE 1.25* 1.21* 1.09*  CALCIUM 8.5* 8.2* 8.2*   Recent Labs    08/01/22 0000  09/26/22 1539  AST 18 17  ALT 8 12  ALKPHOS 86 71  BILITOT  --  0.3  PROT  --  7.1  ALBUMIN 3.8 3.1*   Recent Labs    09/26/22 1539 09/29/22 0513 09/30/22 0931 10/01/22 0806 10/02/22 0900  WBC 8.2   < > 10.7* 11.0* 9.9  NEUTROABS 4.2  --   --   --   --   HGB 10.7*   < > 9.2* 8.5* 8.8*  HCT 33.5*   < > 28.9* 25.7* 27.2*  MCV 87.7   < > 88.1 87.1 86.6  PLT 151   < > 96* 87* 109*   < > = values in this interval not displayed.   Lab Results  Component Value Date   TSH 2.87 02/03/2022   Lab Results  Component Value Date   HGBA1C 5.7 (H) 09/26/2022   Lab Results  Component Value Date   CHOL 161 09/27/2022   HDL 39 (L) 09/27/2022   LDLCALC 114 (H) 09/27/2022   LDLDIRECT 81.3 08/19/2020   TRIG 39 09/27/2022   CHOLHDL 4.1 09/27/2022    Significant Diagnostic Results in last 30 days:  DG Swallowing Func-Speech Pathology  Result Date: 09/29/2022 Table formatting from the original result was not included. Objective Swallowing Evaluation: Type of Study: MBS-Modified Barium Swallow Study  Patient Details Name: JOSEFITA WEISSMANN MRN: [id number] Date of Birth: [birth year] Today's Date: 09/29/2022 Time: SLP Start  Time (ACUTE ONLY): [id number] -SLP Stop Time (ACUTE ONLY): 0901 SLP Time Calculation (min) (ACUTE ONLY): 25 min Past Medical History: Past Medical History: Diagnosis Date  Actinic keratosis 05/25/2014  Anxiety   Atrial fibrillation ([place]) 09/21/2010  Basal cell carcinoma 05/25/2014  Multiple removed by Dr. Danella Sensing in March 2015: right neck, left neck, scalp   Bell's palsy 07/18/1979  Cervicalgia 01/31/2012  Closed fracture of lumbar vertebra without mention of spinal cord injury 07/17/2005  Conjunctiva disorder 12/26/2010  Coronary atherosclerosis of native coronary artery 07/17/1997  Cramp of limb 08/16/2009  Degeneration of lumbar or lumbosacral intervertebral disc 01/31/2012  Disturbance of skin sensation 08/2009  Dizziness and giddiness 11/08/2011  vertigo  External hemorrhoids without  mention of complication 93/81/8299  External hemorrhoids without mention of complication 37/16/9678  Ganglion of tendon sheath 08/16/2009  Leg cramp 10/27/2013  Most frequently the right leg.   Long term (current) use of anticoagulants 09/2010  Lumbago 07/2009  Meralgia paresthetica 07/2007  MI, old   Myalgia and myositis, unspecified 01/17/2012  Osteoporosis   Other abnormal blood chemistry 04/08/2012  Other abnormal blood chemistry 2013  hyperglycemia  Other disorder of muscle, ligament, and fascia 04/08/2012  Other specified cardiac dysrhythmias(427.89) 06/27/2010  Pacemaker 05/06/2020  Pain in joint, ankle and foot 10/27/2013  Bilateral since 1998   Pain in joint, shoulder region 08/12/2012  Pain in joint, upper arm 12/26/2010  Pain in limb 01/11/2011  Pain in thoracic spine 01/31/2012  Pathologic fracture of vertebrae 01/22/2012  PVC's (premature ventricular contractions)   Rash and other nonspecific skin eruption 08/02/2011  Senile osteoporosis 07/17/1993  Stroke ([place])   Unspecified essential hypertension 07/17/1997  Varicose veins of lower extremities 08/16/2009  Varicose veins of lower extremities 08/16/2009 Past Surgical History: Past Surgical History: Procedure Laterality Date  COLONOSCOPY WITH PROPOFOL N/A 10/19/2015  Procedure: COLONOSCOPY WITH PROPOFOL;  Surgeon: Gatha Mayer, MD;  [facility];  Service: Endoscopy;  Laterality: N/A;  CORONARY ARTERY BYPASS GRAFT  1998  x2; LIMA to LAD; SVG to diagonal off bypass  HEMORRHOID SURGERY  08/26/2012  Dr. Brantley Stage  LOOP RECORDER INSERTION N/A 05/13/2018  Procedure: LOOP RECORDER INSERTION;  Surgeon: [name], MD;  Location: [facility];  Service: Cardiovascular;  Laterality: N/A;  LOOP RECORDER REMOVAL N/A 05/26/2019  Procedure: LOOP RECORDER REMOVAL;  Surgeon: [name], MD;  Location: [facility];  Service: Cardiovascular;  Laterality: N/A;  MASS EXCISION Left 11/23/2015  Procedure: LEFT WRIST EXCISION CYST;  Surgeon: Leanora Cover, MD;  Location: [facility];  Service: Orthopedics;  Laterality: Left;  PACEMAKER IMPLANT N/A 05/26/2019  Procedure: PACEMAKER IMPLANT;  Surgeon: [name], MD;  Location: [facility];  Service: Cardiovascular;  Laterality: N/A;  SKIN BIOPSY  01/29/14  (R) neck; (R) scalp, 2 (L) neck; shave biopsy Superficial basal cell carcinoma Dr. Danella Sensing  TONSILLECTOMY  [phone] HPI: Pt is a 88 year old female who presented with dysarthria, left facial droop, and right gaze preference. TNK given.CT head negative for acute changes. CTA neck 11/21: Occlusion of the right internal carotid artery, ccclusion of the right PCA proximally with reconstitution of flow in the P2 segment, short-segment occlusion of a right M2 branch proximally and additional focal severe stenosis of an inferior right M2 segment  proximally. PMH: atrial fibrillation not on anticoagulation due to a previous bleeding left knee Bakers cyst but maintained on aspirin, right basal ganglia ischemic infarction in 2021 with resultant left upper extremity weakness and  spasticity resulting in contracture as well as left lower extremity weakness. Pt seen for dysphagia in 2021 and was discharged on dysphagia 3 and thin liquids. Per pt's daughter, the pt subsequently received SLP services and was advanced to regular and thin liquids.  Subjective: pt orally holding during MBS  Recommendations for follow up therapy are one component of a multi-disciplinary discharge planning process, led by the attending physician.  Recommendations may be updated based on patient status, additional functional criteria and insurance authorization. Assessment / Plan / Recommendation   09/29/2022   9:00 AM Clinical Impressions Clinical Impression MBS was completed in limited fashion given oral holding requiring suctioning of boluses from oral cavity. Daughter present and believes this may be related to taste and/or level of alertness. Oral deficits include  reduced lingual manipulation and posterior propulsion. There is reduced bolus cohesion and it takes a long time and some verbal cueing to get the pt to swallow liquids. Oral residue is present with liquids as well. Pt does not swallow purees despite offering dry spoon for tactile cueing and liquid washes (would not open her mouth to accept a cup, took minimal liquid off the spoon), requiring yankauer to remove. Pharyngeal phase could not be viewed with purees. In swallowing a small amount of nectar thick liquids and purees by spoon, pt had aspiration before the swallow with nectar thick liquids with a weak, delayed cough that did not clear the airway. Suspect that this was a result of premature spillage as it occurred very quickly - before fluoro could be turned on, and while the remaining bolus was being held orally. Pt had small amounts of penetration with honey thick liquids but it all was ejected spontaneously (PAS 2). Discussed with daughter the limitations of this study, but that we could try staying on Dys 1 diet (purees) since she has been swallowing some of this upstairs, as well as honey thick liquids. Would monitor closely as a team for any signs of intolerance. SLP Visit Diagnosis Dysphagia, oropharyngeal phase (R13.12) Impact on safety and function Moderate aspiration risk;Risk for inadequate nutrition/hydration     09/29/2022   9:00 AM Treatment Recommendations Treatment Recommendations Therapy as outlined in treatment plan below     09/29/2022   9:00 AM Prognosis Prognosis for Safe Diet Advancement Good Barriers to Reach Goals Cognitive deficits;Severity of deficits   09/29/2022   9:00 AM Diet Recommendations SLP Diet Recommendations Dysphagia 1 (Puree) solids;Honey thick liquids Liquid Administration via Cup;Spoon Medication Administration Crushed with puree Compensations Slow rate;Small sips/bites;Follow solids with liquid Postural Changes Seated upright at 90 degrees;Remain semi-upright after after  feeds/meals (Comment)     09/29/2022   9:00 AM Other Recommendations Oral Care Recommendations Oral care BID Follow Up Recommendations Skilled nursing-short term rehab (<3 hours/day) Functional Status Assessment Patient has had a recent decline in their functional status and demonstrates the ability to make significant improvements in function in a reasonable and predictable amount of time.   09/29/2022   9:00 AM Frequency and Duration  Speech Therapy Frequency (ACUTE ONLY) min 2x/week Treatment Duration 2 weeks     09/29/2022   9:00 AM Oral Phase Oral Phase Impaired Oral - Honey Teaspoon Weak lingual manipulation;Reduced posterior propulsion;Decreased bolus cohesion;Lingual/palatal residue;Delayed oral transit Oral - Nectar Teaspoon Weak lingual manipulation;Reduced posterior propulsion;Decreased bolus cohesion;Lingual/palatal residue;Delayed oral transit Oral - Puree Holding of bolus;Other (Comment)    09/29/2022   9:00 AM Pharyngeal Phase Pharyngeal Phase Impaired Pharyngeal- Honey Teaspoon Penetration/Aspiration before swallow;Reduced epiglottic inversion;Reduced tongue base  retraction;Reduced airway/laryngeal closure;Pharyngeal residue - valleculae Pharyngeal Material enters airway, remains ABOVE vocal cords then ejected out Pharyngeal- Nectar Teaspoon Penetration/Aspiration before swallow;Reduced epiglottic inversion;Reduced tongue base retraction;Reduced airway/laryngeal closure;Pharyngeal residue - valleculae Pharyngeal Material enters airway, passes BELOW cords and not ejected out despite cough attempt by patient    09/29/2022   9:00 AM Cervical Esophageal Phase  Cervical Esophageal Phase [facility] Osie Bond., M.A. [facility] Office [phone] Secure chat preferred 09/29/2022, 9:48 AM                     DG CHEST PORT 1 VIEW  Result Date: 09/29/2022 CLINICAL DATA:  Aspiration into lower respiratory tract. EXAM: PORTABLE CHEST 1 VIEW COMPARISON:  08/25/2020. FINDINGS: The heart is  enlarged and the mediastinal contour stable. Atherosclerotic calcification of the aorta is noted. No consolidation, effusion, or pneumothorax. No acute osseous abnormality. A single lead pacemaker device is present over the left chest. Sternotomy wires are present over the midline. IMPRESSION: 1. No active disease. 2. Cardiomegaly. Electronically Signed   By: Brett Fairy M.D.   On: 09/29/2022 01:05   ECHOCARDIOGRAM COMPLETE  Result Date: 09/27/2022    ECHOCARDIOGRAM REPORT   Patient Name:   Susan Conway Date of Exam: 09/27/2022 Medical Rec #:  [id number]   Height:       66.0 in Accession #:    [id number]  Weight:       131.4 lb Date of Birth:  Jun 21, 1929   BSA:          1.673 m Patient Age:    23 years    BP:           167/82 mmHg Patient Gender: F           HR:           68 bpm. Exam Location:  Inpatient Procedure: 2D Echo Indications:    stroke  History:        Patient has prior history of Echocardiogram examinations, most                 recent 08/20/2020. CAD, Pacemaker, Arrythmias:PVC and Atrial                 Fibrillation; Risk Factors:Hypertension and Dyslipidemia.  Sonographer:    Johny Chess RDCS Referring Phys: [phone] MCNEILL P KIRKPATRICK  Sonographer Comments: Image acquisition challenging due to uncooperative patient. IMPRESSIONS  1. Left ventricular ejection fraction, by estimation, is 60 to 65%. The left ventricle has normal function. The left ventricle has no regional wall motion abnormalities. Left ventricular diastolic parameters are indeterminate.  2. Right ventricular systolic function is normal. The right ventricular size is normal. There is normal pulmonary artery systolic pressure.  3. Left atrial size was moderately dilated.  4. Right atrial size was severely dilated.  5. The mitral valve is degenerative. Trivial mitral valve regurgitation. No evidence of mitral stenosis.  6. Tricuspid valve regurgitation is moderate to severe.  7. The aortic valve is tricuspid. There is severe  calcifcation of the aortic valve. Aortic valve regurgitation is not visualized. Aortic valve sclerosis/calcification is present, without any evidence of aortic stenosis. Comparison(s): No significant change from prior study. FINDINGS  Left Ventricle: Left ventricular ejection fraction, by estimation, is 60 to 65%. The left ventricle has normal function. The left ventricle has no regional wall motion abnormalities. The left ventricular internal cavity size was normal in size. There is  no left ventricular hypertrophy. Left ventricular diastolic parameters  are indeterminate. Right Ventricle: The right ventricular size is normal. No increase in right ventricular wall thickness. Right ventricular systolic function is normal. There is normal pulmonary artery systolic pressure. The tricuspid regurgitant velocity is 2.73 m/s, and  with an assumed right atrial pressure of 3 mmHg, the estimated right ventricular systolic pressure is 26.9 mmHg. Left Atrium: Left atrial size was moderately dilated. Right Atrium: Right atrial size was severely dilated. Pericardium: There is no evidence of pericardial effusion. Mitral Valve: The mitral valve is degenerative in appearance. Mild to moderate mitral annular calcification. Trivial mitral valve regurgitation. No evidence of mitral valve stenosis. Tricuspid Valve: The tricuspid valve is grossly normal. Tricuspid valve regurgitation is moderate to severe. Aortic Valve: The aortic valve is tricuspid. There is severe calcifcation of the aortic valve. Aortic valve regurgitation is not visualized. Aortic valve sclerosis/calcification is present, without any evidence of aortic stenosis. Pulmonic Valve: The pulmonic valve was normal in structure. Pulmonic valve regurgitation is trivial. No evidence of pulmonic stenosis. Aorta: The aortic root is normal in size and structure. IAS/Shunts: No atrial level shunt detected by color flow Doppler. Additional Comments: A device lead is visualized in  the right ventricle and right atrium.  LEFT VENTRICLE PLAX 2D LVIDd:         3.90 cm LVIDs:         2.20 cm LV PW:         1.00 cm LV IVS:        0.90 cm LVOT diam:     1.60 cm LV SV:         28 LV SV Index:   17 LVOT Area:     2.01 cm  RIGHT VENTRICLE            IVC RV Basal diam:  2.90 cm    IVC diam: 1.80 cm RV S prime:     8.27 cm/s TAPSE (M-mode): 1.0 cm LEFT ATRIUM             Index        RIGHT ATRIUM           Index LA diam:        4.40 cm 2.63 cm/m   RA Area:     25.40 cm LA Vol (A2C):   69.8 ml 41.73 ml/m  RA Volume:   80.20 ml  47.94 ml/m LA Vol (A4C):   61.8 ml 36.94 ml/m LA Biplane Vol: 65.7 ml 39.28 ml/m  AORTIC VALVE LVOT Vmax:   75.50 cm/s LVOT Vmean:  48.400 cm/s LVOT VTI:    0.139 m  AORTA Ao Root diam: 2.90 cm Ao Asc diam:  3.10 cm TRICUSPID VALVE TR Peak grad:   29.8 mmHg TR Vmax:        273.00 cm/s  SHUNTS Systemic VTI:  0.14 m Systemic Diam: 1.60 cm Rudean Haskell MD Electronically signed by Rudean Haskell MD Signature Date/Time: 09/27/2022/4:34:47 PM    Final    CT HEAD WO CONTRAST  Result Date: 09/27/2022 CLINICAL DATA:  Stroke, follow up EXAM: CT HEAD WITHOUT CONTRAST TECHNIQUE: Contiguous axial images were obtained from the base of the skull through the vertex without intravenous contrast. RADIATION DOSE REDUCTION: This exam was performed according to the departmental dose-optimization program which includes automated exposure control, adjustment of the mA and/or kV according to patient size and/or use of iterative reconstruction technique. COMPARISON:  November 21, 23. FINDINGS: Brain: Large acute right PCA territory infarct which involves the right thalamus held medial  right temporal lobe, right hippocampus, and right occipital lobe. No evidence of acute hemorrhage, mass lesion or midline shift. No hydrocephalus. Vascular: Hyperdense right PCA. Skull: No acute fracture. Sinuses/Orbits: Bilateral sphenoid sinus opacification. Other: No acute orbital findings.  IMPRESSION: 1. Large acute right PCA territory infarct. 2. Hyperdense right PCA, compatible with known occlusion. These results will be called to the ordering clinician or representative by the Radiologist Assistant, and communication documented in the PACS or [employer]. Electronically Signed   By: Margaretha Sheffield M.D.   On: 09/27/2022 13:53   CT ANGIO HEAD NECK W WO CM (CODE STROKE)  Result Date: 09/26/2022 CLINICAL DATA:  Code stroke EXAM: CT ANGIOGRAPHY HEAD AND NECK TECHNIQUE: Multidetector CT imaging of the head and neck was performed using the standard protocol during bolus administration of intravenous contrast. Multiplanar CT image reconstructions and MIPs were obtained to evaluate the vascular anatomy. Carotid stenosis measurements (when applicable) are obtained utilizing NASCET criteria, using the distal internal carotid diameter as the denominator. RADIATION DOSE REDUCTION: This exam was performed according to the departmental dose-optimization program which includes automated exposure control, adjustment of the mA and/or kV according to patient size and/or use of iterative reconstruction technique. CONTRAST:  36m OMNIPAQUE IOHEXOL 350 MG/ML SOLN COMPARISON:  Same-day noncontrast CT head, MR head and MRA head/neck 08/19/2020 FINDINGS: CTA NECK FINDINGS Aortic arch: There is calcified plaque in the imaged aortic arch. 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 The origins of the major branch vessels are patent with scattered soft and calcified plaque but no high-grade stenosis. Right carotid system: The right common carotid artery is patent but poorly opacified due to the downstream occlusion. The right internal carotid artery is occluded throughout its course. The external carotid artery is  patent but poorly opacified. Left carotid system: The left common carotid artery is patent. There is calcified plaque in the proximal internal carotid artery resulting in up to approximately 50% stenosis. The distal internal carotid artery is patent but tortuous in the neck. The external carotid artery is patent. There is no dissection or aneurysm. Vertebral arteries: There is severe stenosis of the origin of the right vertebral artery and calcified plaque in the V1 segment resulting in mild stenosis. There is additional calcified plaque in the V3 segment resulting in mild-to-moderate stenosis. The left vertebral artery is patent without hemodynamically significant stenosis or occlusion in the neck. There is no evidence of dissection or aneurysm. Skeleton: There is multilevel degenerative change of the cervical spine. There is no acute osseous abnormality or suspicious osseous lesion. There is no visible canal hematoma. Other neck: The soft tissues are unremarkable. Upper chest: There is scarring in the lung apices. Review of the MIP images confirms the above findings CTA HEAD FINDINGS Anterior circulation: The right ICA is occluded throughout its course with reconstitution of flow at the M1 segment. There is short-segment occlusion of a right M2 branch proximally (8-108, 7-91). There is additional focal severe stenosis of an inferior right M2 segment proximally (7-96). There is atherosclerotic irregularity and narrowing in the remainder of the right MCA branches. There is calcified plaque in the left intracranial ICA resulting in up to mild-to-moderate stenosis of the supraclinoid segment. There is mild stenosis of the left M1 segment. The distal left MCA branches appear patent without proximal  high-grade stenosis or occlusion. The bilateral ACAs are patent with focal severe stenosis of the left A2 segment (7-67). There is no other proximal high-grade stenosis or occlusion. There is no aneurysm or AVM. Posterior  circulation: There is plaque in the right V4 segment resulting in severe stenosis proximally. There is critical stenosis/occlusion of the left vertebral artery at the V3/V4 junction with diminutive caliber of the remainder of the V4 segment. The basilar artery is patent with mild irregularity. The major cerebellar arteries appear patent. There is a fetal origin of the left PCA the left PCA appears patent, with mild-to-moderate stenosis of the proximal P2 segment (100-152). The right PCA is not identified proximally there is reconstitution of flow in the P2 segment (100-161). Note that the right PCA is also fetal in origin and primarily supplied by a now occluded posterior communicating artery. Venous sinuses: Suboptimally evaluated due to bolus timing. Anatomic variants: As above. Review of the MIP images confirms the above findings IMPRESSION: 1. 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 2. Short-segment occlusion of a right M2 branch proximally and additional focal severe stenosis of an inferior right M2 segment proximally. 3. Occlusion of the right PCA proximally with reconstitution of flow in the P2 segment. Note that the right PCA is also fetal in origin and primarily supplied by a now occluded posterior communicating artery. 4. Additional intracranial atherosclerotic disease resulting in severe stenosis of the left A2 segment, severe stenosis of the right V4 segment, and critical stenosis/occlusion of the left vertebral artery at the V3/V4 junction with diminutive caliber of the remainder of the V4 segment. 5. Calcified plaque in the left carotid bifurcation resulting in approximately 50% stenosis. 6. Severe stenosis at the origin of the right vertebral artery. 7. 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 Recommend vascular surgery referral. Findings were communicated with Dr. Leonel Ramsay at 3:55 p.m. Electronically Signed   By: Valetta Mole M.D.   On: 09/26/2022 16:02   CT HEAD CODE STROKE WO CONTRAST  Addendum Date: 09/26/2022   ADDENDUM REPORT: 09/26/2022 15:57 ADDENDUM: Impression #2 should state chronic infract in the periventricular white matter/right corona radiata Electronically Signed   By: Marin Roberts M.D.   On: 09/26/2022 15:57   Result Date: 09/26/2022 CLINICAL DATA:  Code stroke. EXAM: CT HEAD WITHOUT CONTRAST TECHNIQUE: Contiguous axial images were obtained from the base of the skull through the vertex without intravenous contrast. RADIATION DOSE REDUCTION: This exam was performed according to the departmental dose-optimization program which includes automated exposure control, adjustment of the mA and/or kV according to patient size and/or use of iterative reconstruction technique. COMPARISON:  08/17/20 CT brain, 08/19/20 MRI brain FINDINGS: Brain: No evidence of acute infarction, hemorrhage, hydrocephalus, extra-axial collection or mass lesion/mass effect. Redemonstrated generalized volume loss with sequela of moderate to severe chronic microvascular ischemic change. Redemonstrated area of acute to subacute infarct in the periventricular white matter/right corona radiata (series 100, image 26). Vascular: No hyperdense vessel or unexpected calcification. Skull: Normal. Negative for fracture or focal lesion. Sinuses/Orbits: Bilateral lens replacements. Paranasal sinuses are clear. Other: None. ASPECTS ([place] Stroke Program Early CT Score): 10 IMPRESSION: 1. No hemorrhage or definitive CT evidence of a new infarct. 2. Redemonstrated area of acute to subacute infarct in the periventricular white  matter/right corona radiata. Findings were paged to Dr. Leonel Ramsay on 09/26/22 at 3:31 PM. Electronically Signed: By: Marin Roberts M.D. On: 09/26/2022  15:31    Assessment/Plan 1. 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 Lovena Le outpatient - speech slowed/slurred, right eye gaze, left hemiparesis - cont skilled nursing   2. Permanent atrial fibrillation (HCC) - CHADS2VASC Score 7, stroke risk 11.2% - cont asa - HR controlled with atenolol and digoxin - see above  3. Essential hypertension - SBP at goal < 180 - cont norvasc, atenolol, and losartan  4. Mixed hyperlipidemia - LDL 114, goal <70 - cont atorvastatin  5. Dysphagia, unspecified type - cont DYS1 with honey thick liquids - ST evaluation  6. Anemia due to stage 3a chronic kidney disease ([place]) - hgb 8.8 10/02/2022  7. Thrombocytopenia ([place]) - platelets 109 09/07/2022    Family/ staff Communication: plan discussed with patient and nurse  Labs/tests ordered:  cbc/diff, bmp in 2 weeks

## 2022-10-04 ENCOUNTER — Non-Acute Institutional Stay (SKILLED_NURSING_FACILITY): Payer: Medicare Other | Admitting: Orthopedic Surgery

## 2022-10-04 ENCOUNTER — Encounter: Payer: Self-pay | Admitting: Orthopedic Surgery

## 2022-10-04 DIAGNOSIS — I6521 Occlusion and stenosis of right carotid artery: Secondary | ICD-10-CM | POA: Diagnosis not present

## 2022-10-04 DIAGNOSIS — I69354 Hemiplegia and hemiparesis following cerebral infarction affecting left non-dominant side: Secondary | ICD-10-CM

## 2022-10-04 DIAGNOSIS — I4821 Permanent atrial fibrillation: Secondary | ICD-10-CM

## 2022-10-04 DIAGNOSIS — I10 Essential (primary) hypertension: Secondary | ICD-10-CM

## 2022-10-04 DIAGNOSIS — I6621 Occlusion and stenosis of right posterior cerebral artery: Secondary | ICD-10-CM | POA: Diagnosis not present

## 2022-10-04 DIAGNOSIS — R1312 Dysphagia, oropharyngeal phase: Secondary | ICD-10-CM | POA: Diagnosis not present

## 2022-10-04 DIAGNOSIS — I69391 Dysphagia following cerebral infarction: Secondary | ICD-10-CM | POA: Diagnosis not present

## 2022-10-04 DIAGNOSIS — I69322 Dysarthria following cerebral infarction: Secondary | ICD-10-CM | POA: Diagnosis not present

## 2022-10-04 LAB — CULTURE, BLOOD (ROUTINE X 2)
Culture: NO GROWTH
Culture: NO GROWTH
Special Requests: ADEQUATE
Special Requests: ADEQUATE

## 2022-10-04 MED ORDER — HYDROCHLOROTHIAZIDE 12.5 MG PO TABS: 12.5000 mg | ORAL_TABLET | Freq: Every day | ORAL | 0 refills | Status: DC

## 2022-10-04 NOTE — Progress Notes (Addendum)
Location:  [facility] Room Number: 118/A Place of Service:  SNF [phone]) Provider:  Windell Moulding, NP   Patient Care Team: [name], MD as PCP - General (Internal Medicine) [name], MD as PCP - Electrophysiology (Cardiology) [name], MD as Consulting Physician (Dermatology) [name], MD as Consulting Physician (Cardiology) [facility] [name], MD as Consulting Physician (Dermatology)  Extended Emergency Contact Information Primary Emergency Contact: [name] of [place] Phone: [phone] Mobile Phone: [phone] Relation: Daughter Secondary Emergency Contact: Terrilyn Saver of [place] Phone: [phone] Mobile Phone: [phone] Relation: Son  Code Status:  DNR Goals of care: Advanced Directive information    10/04/2022    9:38 AM  Advanced Directives  Does Patient Have a Medical Advance Directive? Yes  Type of Paramedic of [place];Living will;Out of facility DNR (pink MOST or yellow form)  Does patient want to make changes to medical advance directive? No - Patient declined  Pre-existing out of facility DNR order (yellow form or pink MOST form) Pink Most/Yellow Form available - Physician notified to receive inpatient order;Yellow form placed in chart (order not valid for inpatient use)     Chief Complaint  Patient presents with   Acute Visit    Acute Visit with provider on site at [facility].  Patient with elevated Blood Pressure    Quality Metric Gaps    To Discuss the following as listed to the Care Gaps as listed below.     HPI:  Pt is a 88 y.o. female seen today for an acute visit for elevated blood pressure.   Daughter present during encounter.   She currently resides on the skilled nursing unit at [facility]. Past medical history includes: atrial fibrillation, AV block, CAD, hypertension, GERD, stroke,  left sided paralysis, constipation, and dysphagia.    Hospitalized 11/21-11/27 due to acute right PCA territory infarct, TNK given, thought to be embolic since not on anticoagulation prior to event, h/o atrial fibrillation.   AM blood pressure 198/108. She was given amlodipine 5 mg, atenolol 50 mg and losartan 25 mg this morning. Blood pressure remained elevated after 1 hour recheck. She was given hydralazine 10 mg once> rechecked SBP 190/92. She was given losartan 25 mg once, rechecked blood pressure improved 154/82. Daily HCTZ and hydralazine prn stopped during recent hospitalization. She continues to have slowed speech with delayed responsiveness. She is scheduled to see cardiology to discuss anticoagulation. At this time she is on aspirin. H/o GI bleed on coumadin and ruptured Bakers cyst on Eliquis. Daughter expresses need to reschedule appointment. Concerns for future CVA events without being on anticoagulation discussed with daughter. I recommend keeping current appointment with cardiology at this time.    Past Medical History:  Diagnosis Date   Actinic keratosis 05/25/2014   Anxiety    Atrial fibrillation ([place]) 09/21/2010   Basal cell carcinoma 05/25/2014   Multiple removed by Dr. Danella Sensing in March 2015: right neck, left neck, scalp    Bell's palsy 07/18/1979   Cervicalgia 01/31/2012   Closed fracture of lumbar vertebra without mention of spinal cord injury 07/17/2005   Conjunctiva disorder 12/26/2010   Coronary atherosclerosis of native coronary artery 07/17/1997   Cramp of limb 08/16/2009   Degeneration of lumbar or lumbosacral intervertebral disc 01/31/2012   Disturbance of skin sensation 08/2009   Dizziness and giddiness 11/08/2011   vertigo   External hemorrhoids without mention of complication 53/97/6734  External hemorrhoids without mention of complication 42/35/3614   Ganglion of tendon sheath 08/16/2009   Leg cramp 10/27/2013   Most frequently the right leg.    Long  term (current) use of anticoagulants 09/2010   Lumbago 07/2009   Meralgia paresthetica 07/2007   MI, old    Myalgia and myositis, unspecified 01/17/2012   Osteoporosis    Other abnormal blood chemistry 04/08/2012   Other abnormal blood chemistry 2013   hyperglycemia   Other disorder of muscle, ligament, and fascia 04/08/2012   Other specified cardiac dysrhythmias(427.89) 06/27/2010   Pacemaker 05/06/2020   Pain in joint, ankle and foot 10/27/2013   Bilateral since 1998    Pain in joint, shoulder region 08/12/2012   Pain in joint, upper arm 12/26/2010   Pain in limb 01/11/2011   Pain in thoracic spine 01/31/2012   Pathologic fracture of vertebrae 01/22/2012   PVC's (premature ventricular contractions)    Rash and other nonspecific skin eruption 08/02/2011   Senile osteoporosis 07/17/1993   Stroke ([place])    Unspecified essential hypertension 07/17/1997   Varicose veins of lower extremities 08/16/2009   Varicose veins of lower extremities 08/16/2009   Past Surgical History:  Procedure Laterality Date   COLONOSCOPY WITH PROPOFOL N/A 10/19/2015   Procedure: COLONOSCOPY WITH PROPOFOL;  Surgeon: Gatha Mayer, MD;  Location: [facility];  Service: Endoscopy;  Laterality: N/A;   CORONARY ARTERY BYPASS GRAFT  1998   x2; LIMA to LAD; SVG to diagonal off bypass   HEMORRHOID SURGERY  08/26/2012   Dr. Brantley Stage   LOOP RECORDER INSERTION N/A 05/13/2018   Procedure: LOOP RECORDER INSERTION;  Surgeon: [name], MD;  Location: [facility];  Service: Cardiovascular;  Laterality: N/A;   LOOP RECORDER REMOVAL N/A 05/26/2019   Procedure: LOOP RECORDER REMOVAL;  Surgeon: [name], MD;  Location: [facility];  Service: Cardiovascular;  Laterality: N/A;   MASS EXCISION Left 11/23/2015   Procedure: LEFT WRIST EXCISION CYST;  Surgeon: Leanora Cover, MD;  Location: [facility];  Service: Orthopedics;  Laterality: Left;   PACEMAKER IMPLANT N/A 05/26/2019   Procedure:  PACEMAKER IMPLANT;  Surgeon: [name], MD;  Location: [facility];  Service: Cardiovascular;  Laterality: N/A;   SKIN BIOPSY  01/29/14   (R) neck; (R) scalp, 2 (L) neck; shave biopsy Superficial basal cell carcinoma Dr. Danella Sensing   TONSILLECTOMY  [phone]    Allergies  Allergen Reactions   Iodinated Contrast Media Nausea Only and Other (See Comments)    Severe nausea and also passed out   Iodine Nausea Only and Other (See Comments)    "Allergic," per [facility]- PASSED OUT   Lodine [Etodolac] Other (See Comments)    "Allergic," per [facility]   Other Other (See Comments)    Seasonal Allergies   Bactrim Rash   Relafen [Nabumetone] Rash   Sulfanilamide Rash    Outpatient Encounter Medications as of 10/04/2022  Medication Sig   acetaminophen (TYLENOL) 325 MG tablet Take 650 mg by mouth 3 (three) times daily as needed (for pain).   amLODipine (NORVASC) 5 MG tablet Take 5 mg by mouth daily.   aspirin 81 MG chewable tablet Chew 1 tablet (81 mg total) by mouth daily.   atenolol (TENORMIN) 50 MG tablet Take 1 tablet (50 mg total) by mouth 2 (two) times daily.   atorvastatin (LIPITOR) 40 MG tablet Take 1 tablet (40 mg total) by mouth daily.   Calcium Carb-Cholecalciferol (CALCIUM 500 +  D3 PO) Take 1 capsule by mouth in the morning.   diclofenac Sodium (VOLTAREN) 1 % GEL Apply 2 g topically as needed (BID PRN).   digoxin (LANOXIN) 0.125 MG tablet Take 1 tablet (0.125 mg total) by mouth every other day.   docusate sodium (COLACE) 100 MG capsule Take 1 capsule (100 mg total) by mouth daily.   levocetirizine (XYZAL) 5 MG tablet Take 5 mg by mouth at bedtime.   lidocaine (LIDODERM) 5 % Place 1 patch onto the skin as needed (for back pain- Remove & Discard patch within 12 hours or as directed by MD).   LORazepam (ATIVAN) 1 MG tablet Take 1 tablet (1 mg total) by mouth at bedtime.   losartan (COZAAR) 25 MG tablet Take 25 mg by mouth daily.   polyethylene glycol (MIRALAX / GLYCOLAX) 17 g packet  Take 17 g by mouth See admin instructions. Mix 17 grams of powder into 4-6 oz of beverage of choice and drink once a day   potassium chloride SA (KLOR-CON) 20 MEQ tablet Take 10 mEq by mouth every evening.   No facility-administered encounter medications on file as of 10/04/2022.    Review of Systems  Unable to perform ROS: Mental status change    Immunization History  Administered Date(s) Administered   Influenza Whole 08/06/2012   Influenza, High Dose Seasonal PF 08/15/2019, 08/27/2020   Influenza,inj,Quad PF,6+ Mos 08/30/2018   Influenza-Unspecified 08/06/2013, 08/24/2014, 08/26/2015, 08/31/2016, 08/27/2017, 08/27/2020   Moderna Sars-Covid-2 Vaccination 11/16/2019, 12/16/2019, 09/16/2020   Pneumococcal Conjugate-13 09/22/2015   Pneumococcal Polysaccharide-23 08/29/2006   Td 10/07/2007   Zoster Recombinat (Shingrix) 02/12/2018, 05/13/2018   Zoster, Live 03/12/2008   Pertinent  Health Maintenance Due  Topic Date Due   INFLUENZA VACCINE  06/06/2022   DEXA SCAN  Completed      10/01/2022    8:00 AM 10/01/2022    8:00 PM 10/02/2022    8:00 AM 10/03/2022   10:49 AM 10/04/2022    9:50 AM  Fall Risk  Falls in the past year?    1 1  Was there an injury with Fall?    1 1  Fall Risk Category Calculator    3 3  Fall Risk Category    High High  Patient Fall Risk Level High fall risk High fall risk High fall risk High fall risk High fall risk  Patient at Risk for Falls Due to    History of fall(s) History of fall(s)  Fall risk Follow up    Falls evaluation completed Falls evaluation completed   Functional Status Survey:    Vitals:   10/04/22 0933  BP: 125/77  Pulse: 66  Resp: 16  Temp: 97.9 F (36.6 C)  SpO2: 95%  Weight: 130 lb 8 oz (59.2 kg)  Height: '5\' 6"'$  (1.676 m)   Body mass index is 21.06 kg/m. Physical Exam Vitals reviewed.  Constitutional:      General: She is not in acute distress. HENT:     Head: Normocephalic.  Eyes:     General:        Right eye:  No discharge.        Left eye: No discharge.  Cardiovascular:     Rate and Rhythm: Normal rate. Rhythm irregular.     Pulses: Normal pulses.     Heart sounds: Normal heart sounds.  Pulmonary:     Effort: Pulmonary effort is normal. No respiratory distress.     Breath sounds: Normal breath sounds. No wheezing.  Abdominal:  General: Bowel sounds are normal. There is no distension.     Palpations: Abdomen is soft.     Tenderness: There is no abdominal tenderness.  Musculoskeletal:     Cervical back: Neck supple.     Right lower leg: No edema.     Left lower leg: No edema.  Skin:    General: Skin is warm.     Capillary Refill: Capillary refill takes less than 2 seconds.  Neurological:     General: No focal deficit present.     Mental Status: She is alert. Mental status is at baseline.     Motor: Weakness present.     Gait: Gait abnormal.     Comments: Slurred/slowed speech, left sided hemiparesis, left facial droop  Psychiatric:        Mood and Affect: Mood normal.     Labs reviewed: Recent Labs    09/30/22 0931 10/01/22 0806 10/02/22 0900  NA 141 142 142  K 3.6 3.3* 3.9  CL 108 111 116*  CO2 22 20* 19*  GLUCOSE 107* 114* 108*  BUN 34* 36* 35*  CREATININE 1.25* 1.21* 1.09*  CALCIUM 8.5* 8.2* 8.2*   Recent Labs    08/01/22 0000 09/26/22 1539  AST 18 17  ALT 8 12  ALKPHOS 86 71  BILITOT  --  0.3  PROT  --  7.1  ALBUMIN 3.8 3.1*   Recent Labs    09/26/22 1539 09/29/22 0513 09/30/22 0931 10/01/22 0806 10/02/22 0900  WBC 8.2   < > 10.7* 11.0* 9.9  NEUTROABS 4.2  --   --   --   --   HGB 10.7*   < > 9.2* 8.5* 8.8*  HCT 33.5*   < > 28.9* 25.7* 27.2*  MCV 87.7   < > 88.1 87.1 86.6  PLT 151   < > 96* 87* 109*   < > = values in this interval not displayed.   Lab Results  Component Value Date   TSH 2.87 02/03/2022   Lab Results  Component Value Date   HGBA1C 5.7 (H) 09/26/2022   Lab Results  Component Value Date   CHOL 161 09/27/2022   HDL 39 (L)  09/27/2022   LDLCALC 114 (H) 09/27/2022   LDLDIRECT 81.3 08/19/2020   TRIG 39 09/27/2022   CHOLHDL 4.1 09/27/2022    Significant Diagnostic Results in last 30 days:  DG Swallowing Func-Speech Pathology  Result Date: 09/29/2022 Table formatting from the original result was not included. Objective Swallowing Evaluation: Type of Study: MBS-Modified Barium Swallow Study  Patient Details Name: Susan Conway MRN: [id number] Date of Birth: 02/05/1929 Today's Date: 09/29/2022 Time: SLP Start Time (ACUTE ONLY): 0836 -SLP Stop Time (ACUTE ONLY): 0901 SLP Time Calculation (min) (ACUTE ONLY): 25 min Past Medical History: Past Medical History: Diagnosis Date  Actinic keratosis 05/25/2014  Anxiety   Atrial fibrillation ([place]) 09/21/2010  Basal cell carcinoma 05/25/2014  Multiple removed by Dr. Danella Sensing in March 2015: right neck, left neck, scalp   Bell's palsy 07/18/1979  Cervicalgia 01/31/2012  Closed fracture of lumbar vertebra without mention of spinal cord injury 07/17/2005  Conjunctiva disorder 12/26/2010  Coronary atherosclerosis of native coronary artery 07/17/1997  Cramp of limb 08/16/2009  Degeneration of lumbar or lumbosacral intervertebral disc 01/31/2012  Disturbance of skin sensation 08/2009  Dizziness and giddiness 11/08/2011  vertigo  External hemorrhoids without mention of complication 77/41/2878  External hemorrhoids without mention of complication 67/67/2094  Ganglion of tendon sheath 08/16/2009  Leg cramp  10/27/2013  Most frequently the right leg.   Long term (current) use of anticoagulants 09/2010  Lumbago 07/2009  Meralgia paresthetica 07/2007  MI, old   Myalgia and myositis, unspecified 01/17/2012  Osteoporosis   Other abnormal blood chemistry 04/08/2012  Other abnormal blood chemistry 2013  hyperglycemia  Other disorder of muscle, ligament, and fascia 04/08/2012  Other specified cardiac dysrhythmias(427.89) 06/27/2010  Pacemaker 05/06/2020  Pain in joint, ankle and foot 10/27/2013  Bilateral since  1998   Pain in joint, shoulder region 08/12/2012  Pain in joint, upper arm 12/26/2010  Pain in limb 01/11/2011  Pain in thoracic spine 01/31/2012  Pathologic fracture of vertebrae 01/22/2012  PVC's (premature ventricular contractions)   Rash and other nonspecific skin eruption 08/02/2011  Senile osteoporosis 07/17/1993  Stroke ([place])   Unspecified essential hypertension 07/17/1997  Varicose veins of lower extremities 08/16/2009  Varicose veins of lower extremities 08/16/2009 Past Surgical History: Past Surgical History: Procedure Laterality Date  COLONOSCOPY WITH PROPOFOL N/A 10/19/2015  Procedure: COLONOSCOPY WITH PROPOFOL;  Surgeon: Gatha Mayer, MD;  [facility]: [facility];  Service: Endoscopy;  Laterality: N/A;  CORONARY ARTERY BYPASS GRAFT  1998  x2; LIMA to LAD; SVG to diagonal off bypass  HEMORRHOID SURGERY  08/26/2012  Dr. Brantley Stage  LOOP RECORDER INSERTION N/A 05/13/2018  Procedure: LOOP RECORDER INSERTION;  Surgeon: [name], MD;  Location: [facility];  Service: Cardiovascular;  Laterality: N/A;  LOOP RECORDER REMOVAL N/A 05/26/2019  Procedure: LOOP RECORDER REMOVAL;  Surgeon: [name], MD;  Location: [facility];  Service: Cardiovascular;  Laterality: N/A;  MASS EXCISION Left 11/23/2015  Procedure: LEFT WRIST EXCISION CYST;  Surgeon: Leanora Cover, MD;  Location: [facility];  Service: Orthopedics;  Laterality: Left;  PACEMAKER IMPLANT N/A 05/26/2019  Procedure: PACEMAKER IMPLANT;  Surgeon: [name], MD;  Location: [facility];  Service: Cardiovascular;  Laterality: N/A;  SKIN BIOPSY  01/29/14  (R) neck; (R) scalp, 2 (L) neck; shave biopsy Superficial basal cell carcinoma Dr. Danella Sensing  TONSILLECTOMY  [phone] HPI: Pt is a 88 year old female who presented with dysarthria, left facial droop, and right gaze preference. TNK given.CT head negative for acute changes. CTA neck 11/21: Occlusion of the right internal carotid artery, ccclusion of the right PCA  proximally with reconstitution of flow in the P2 segment, short-segment occlusion of a right M2 branch proximally and additional focal severe stenosis of an inferior right M2 segment  proximally. PMH: atrial fibrillation not on anticoagulation due to a previous bleeding left knee Bakers cyst but maintained on aspirin, right basal ganglia ischemic infarction in 2021 with resultant left upper extremity weakness and spasticity resulting in contracture as well as left lower extremity weakness. Pt seen for dysphagia in 2021 and was discharged on dysphagia 3 and thin liquids. Per pt's daughter, the pt subsequently received SLP services and was advanced to regular and thin liquids.  Subjective: pt orally holding during MBS  Recommendations for follow up therapy are one component of a multi-disciplinary discharge planning process, led by the attending physician.  Recommendations may be updated based on patient status, additional functional criteria and insurance authorization. Assessment / Plan / Recommendation   09/29/2022   9:00 AM Clinical Impressions Clinical Impression MBS was completed in limited fashion given oral holding requiring suctioning of boluses from oral cavity. Daughter present and believes this may be related to taste and/or level of alertness. Oral deficits include reduced lingual manipulation and posterior propulsion. There is reduced  bolus cohesion and it takes a long time and some verbal cueing to get the pt to swallow liquids. Oral residue is present with liquids as well. Pt does not swallow purees despite offering dry spoon for tactile cueing and liquid washes (would not open her mouth to accept a cup, took minimal liquid off the spoon), requiring yankauer to remove. Pharyngeal phase could not be viewed with purees. In swallowing a small amount of nectar thick liquids and purees by spoon, pt had aspiration before the swallow with nectar thick liquids with a weak, delayed cough that did not clear the  airway. Suspect that this was a result of premature spillage as it occurred very quickly - before fluoro could be turned on, and while the remaining bolus was being held orally. Pt had small amounts of penetration with honey thick liquids but it all was ejected spontaneously (PAS 2). Discussed with daughter the limitations of this study, but that we could try staying on Dys 1 diet (purees) since she has been swallowing some of this upstairs, as well as honey thick liquids. Would monitor closely as a team for any signs of intolerance. SLP Visit Diagnosis Dysphagia, oropharyngeal phase (R13.12) Impact on safety and function Moderate aspiration risk;Risk for inadequate nutrition/hydration     09/29/2022   9:00 AM Treatment Recommendations Treatment Recommendations Therapy as outlined in treatment plan below     09/29/2022   9:00 AM Prognosis Prognosis for Safe Diet Advancement Good Barriers to Reach Goals Cognitive deficits;Severity of deficits   09/29/2022   9:00 AM Diet Recommendations SLP Diet Recommendations Dysphagia 1 (Puree) solids;Honey thick liquids Liquid Administration via Cup;Spoon Medication Administration Crushed with puree Compensations Slow rate;Small sips/bites;Follow solids with liquid Postural Changes Seated upright at 90 degrees;Remain semi-upright after after feeds/meals (Comment)     09/29/2022   9:00 AM Other Recommendations Oral Care Recommendations Oral care BID Follow Up Recommendations Skilled nursing-short term rehab (<3 hours/day) Functional Status Assessment Patient has had a recent decline in their functional status and demonstrates the ability to make significant improvements in function in a reasonable and predictable amount of time.   09/29/2022   9:00 AM Frequency and Duration  Speech Therapy Frequency (ACUTE ONLY) min 2x/week Treatment Duration 2 weeks     09/29/2022   9:00 AM Oral Phase Oral Phase Impaired Oral - Honey Teaspoon Weak lingual manipulation;Reduced posterior  propulsion;Decreased bolus cohesion;Lingual/palatal residue;Delayed oral transit Oral - Nectar Teaspoon Weak lingual manipulation;Reduced posterior propulsion;Decreased bolus cohesion;Lingual/palatal residue;Delayed oral transit Oral - Puree Holding of bolus;Other (Comment)    09/29/2022   9:00 AM Pharyngeal Phase Pharyngeal Phase Impaired Pharyngeal- Honey Teaspoon Penetration/Aspiration before swallow;Reduced epiglottic inversion;Reduced tongue base retraction;Reduced airway/laryngeal closure;Pharyngeal residue - valleculae Pharyngeal Material enters airway, remains ABOVE vocal cords then ejected out Pharyngeal- Nectar Teaspoon Penetration/Aspiration before swallow;Reduced epiglottic inversion;Reduced tongue base retraction;Reduced airway/laryngeal closure;Pharyngeal residue - valleculae Pharyngeal Material enters airway, passes BELOW cords and not ejected out despite cough attempt by patient    09/29/2022   9:00 AM Cervical Esophageal Phase  Cervical Esophageal Phase [facility] Osie Bond., M.A. [facility] Office [phone] Secure chat preferred 09/29/2022, 9:48 AM                     DG CHEST PORT 1 VIEW  Result Date: 09/29/2022 CLINICAL DATA:  Aspiration into lower respiratory tract. EXAM: PORTABLE CHEST 1 VIEW COMPARISON:  08/25/2020. FINDINGS: The heart is enlarged and the mediastinal contour stable. Atherosclerotic calcification of the aorta is noted. No  consolidation, effusion, or pneumothorax. No acute osseous abnormality. A single lead pacemaker device is present over the left chest. Sternotomy wires are present over the midline. IMPRESSION: 1. No active disease. 2. Cardiomegaly. Electronically Signed   By: Brett Fairy M.D.   On: 09/29/2022 01:05   ECHOCARDIOGRAM COMPLETE  Result Date: 09/27/2022    ECHOCARDIOGRAM REPORT   Patient Name:   SHANTERA MONTS Date of Exam: 09/27/2022 Medical Rec #:  [id number]   Height:       66.0 in Accession #:    [id number]  Weight:       131.4 lb  Date of Birth:  1929/08/30   BSA:          1.673 m Patient Age:    38 years    BP:           167/82 mmHg Patient Gender: F           HR:           68 bpm. Exam Location:  Inpatient Procedure: 2D Echo Indications:    stroke  History:        Patient has prior history of Echocardiogram examinations, most                 recent 08/20/2020. CAD, Pacemaker, Arrythmias:PVC and Atrial                 Fibrillation; Risk Factors:Hypertension and Dyslipidemia.  Sonographer:    Johny Chess RDCS Referring Phys: [phone] MCNEILL P KIRKPATRICK  Sonographer Comments: Image acquisition challenging due to uncooperative patient. IMPRESSIONS  1. Left ventricular ejection fraction, by estimation, is 60 to 65%. The left ventricle has normal function. The left ventricle has no regional wall motion abnormalities. Left ventricular diastolic parameters are indeterminate.  2. Right ventricular systolic function is normal. The right ventricular size is normal. There is normal pulmonary artery systolic pressure.  3. Left atrial size was moderately dilated.  4. Right atrial size was severely dilated.  5. The mitral valve is degenerative. Trivial mitral valve regurgitation. No evidence of mitral stenosis.  6. Tricuspid valve regurgitation is moderate to severe.  7. The aortic valve is tricuspid. There is severe calcifcation of the aortic valve. Aortic valve regurgitation is not visualized. Aortic valve sclerosis/calcification is present, without any evidence of aortic stenosis. Comparison(s): No significant change from prior study. FINDINGS  Left Ventricle: Left ventricular ejection fraction, by estimation, is 60 to 65%. The left ventricle has normal function. The left ventricle has no regional wall motion abnormalities. The left ventricular internal cavity size was normal in size. There is  no left ventricular hypertrophy. Left ventricular diastolic parameters are indeterminate. Right Ventricle: The right ventricular size is normal. No  increase in right ventricular wall thickness. Right ventricular systolic function is normal. There is normal pulmonary artery systolic pressure. The tricuspid regurgitant velocity is 2.73 m/s, and  with an assumed right atrial pressure of 3 mmHg, the estimated right ventricular systolic pressure is 20.9 mmHg. Left Atrium: Left atrial size was moderately dilated. Right Atrium: Right atrial size was severely dilated. Pericardium: There is no evidence of pericardial effusion. Mitral Valve: The mitral valve is degenerative in appearance. Mild to moderate mitral annular calcification. Trivial mitral valve regurgitation. No evidence of mitral valve stenosis. Tricuspid Valve: The tricuspid valve is grossly normal. Tricuspid valve regurgitation is moderate to severe. Aortic Valve: The aortic valve is tricuspid. There is severe calcifcation of the aortic valve. Aortic valve regurgitation is not visualized. Aortic  valve sclerosis/calcification is present, without any evidence of aortic stenosis. Pulmonic Valve: The pulmonic valve was normal in structure. Pulmonic valve regurgitation is trivial. No evidence of pulmonic stenosis. Aorta: The aortic root is normal in size and structure. IAS/Shunts: No atrial level shunt detected by color flow Doppler. Additional Comments: A device lead is visualized in the right ventricle and right atrium.  LEFT VENTRICLE PLAX 2D LVIDd:         3.90 cm LVIDs:         2.20 cm LV PW:         1.00 cm LV IVS:        0.90 cm LVOT diam:     1.60 cm LV SV:         28 LV SV Index:   17 LVOT Area:     2.01 cm  RIGHT VENTRICLE            IVC RV Basal diam:  2.90 cm    IVC diam: 1.80 cm RV S prime:     8.27 cm/s TAPSE (M-mode): 1.0 cm LEFT ATRIUM             Index        RIGHT ATRIUM           Index LA diam:        4.40 cm 2.63 cm/m   RA Area:     25.40 cm LA Vol (A2C):   69.8 ml 41.73 ml/m  RA Volume:   80.20 ml  47.94 ml/m LA Vol (A4C):   61.8 ml 36.94 ml/m LA Biplane Vol: 65.7 ml 39.28 ml/m   AORTIC VALVE LVOT Vmax:   75.50 cm/s LVOT Vmean:  48.400 cm/s LVOT VTI:    0.139 m  AORTA Ao Root diam: 2.90 cm Ao Asc diam:  3.10 cm TRICUSPID VALVE TR Peak grad:   29.8 mmHg TR Vmax:        273.00 cm/s  SHUNTS Systemic VTI:  0.14 m Systemic Diam: 1.60 cm Rudean Haskell MD Electronically signed by Rudean Haskell MD Signature Date/Time: 09/27/2022/4:34:47 PM    Final    CT HEAD WO CONTRAST  Result Date: 09/27/2022 CLINICAL DATA:  Stroke, follow up EXAM: CT HEAD WITHOUT CONTRAST TECHNIQUE: Contiguous axial images were obtained from the base of the skull through the vertex without intravenous contrast. RADIATION DOSE REDUCTION: This exam was performed according to the departmental dose-optimization program which includes automated exposure control, adjustment of the mA and/or kV according to patient size and/or use of iterative reconstruction technique. COMPARISON:  November 21, 23. FINDINGS: Brain: Large acute right PCA territory infarct which involves the right thalamus held medial right temporal lobe, right hippocampus, and right occipital lobe. No evidence of acute hemorrhage, mass lesion or midline shift. No hydrocephalus. Vascular: Hyperdense right PCA. Skull: No acute fracture. Sinuses/Orbits: Bilateral sphenoid sinus opacification. Other: No acute orbital findings. IMPRESSION: 1. Large acute right PCA territory infarct. 2. Hyperdense right PCA, compatible with known occlusion. These results will be called to the ordering clinician or representative by the Radiologist Assistant, and communication documented in the PACS or [employer]. Electronically Signed   By: Margaretha Sheffield M.D.   On: 09/27/2022 13:53   CT ANGIO HEAD NECK W WO CM (CODE STROKE)  Result Date: 09/26/2022 CLINICAL DATA:  Code stroke EXAM: CT ANGIOGRAPHY HEAD AND NECK TECHNIQUE: Multidetector CT imaging of the head and neck was performed using the standard protocol during bolus administration of intravenous contrast.  Multiplanar CT  image reconstructions and MIPs were obtained to evaluate the vascular anatomy. Carotid stenosis measurements (when applicable) are obtained utilizing NASCET criteria, using the distal internal carotid diameter as the denominator. RADIATION DOSE REDUCTION: This exam was performed according to the departmental dose-optimization program which includes automated exposure control, adjustment of the mA and/or kV according to patient size and/or use of iterative reconstruction technique. CONTRAST:  47m OMNIPAQUE IOHEXOL 350 MG/ML SOLN COMPARISON:  Same-day noncontrast CT head, MR head and MRA head/neck 08/19/2020 FINDINGS: CTA NECK FINDINGS Aortic arch: There is calcified plaque in the imaged aortic arch. 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 The origins of the major branch vessels are patent with scattered soft and calcified plaque but no high-grade stenosis. Right carotid system: The right common carotid artery is patent but poorly opacified due to the downstream occlusion. The right internal carotid artery is occluded throughout its course. The external carotid artery is patent but poorly opacified. Left carotid system: The left common carotid artery is patent. There is calcified plaque in the proximal internal carotid artery resulting in up to approximately 50% stenosis. The distal internal carotid artery is patent but tortuous in the neck. The external carotid artery is patent. There is no dissection or aneurysm. Vertebral arteries: There is severe stenosis of the origin of the right vertebral artery and calcified plaque in the V1 segment resulting in mild stenosis. There is additional calcified plaque in the V3 segment resulting in mild-to-moderate stenosis. The left vertebral artery is patent  without hemodynamically significant stenosis or occlusion in the neck. There is no evidence of dissection or aneurysm. Skeleton: There is multilevel degenerative change of the cervical spine. There is no acute osseous abnormality or suspicious osseous lesion. There is no visible canal hematoma. Other neck: The soft tissues are unremarkable. Upper chest: There is scarring in the lung apices. Review of the MIP images confirms the above findings CTA HEAD FINDINGS Anterior circulation: The right ICA is occluded throughout its course with reconstitution of flow at the M1 segment. There is short-segment occlusion of a right M2 branch proximally (8-108, 7-91). There is additional focal severe stenosis of an inferior right M2 segment proximally (7-96). There is atherosclerotic irregularity and narrowing in the remainder of the right MCA branches. There is calcified plaque in the left intracranial ICA resulting in up to mild-to-moderate stenosis of the supraclinoid segment. There is mild stenosis of the left M1 segment. The distal left MCA branches appear patent without proximal high-grade stenosis or occlusion. The bilateral ACAs are patent with focal severe stenosis of the left A2 segment (7-67). There is no other proximal high-grade stenosis or occlusion. There is no aneurysm or AVM. Posterior circulation: There is plaque in the right V4 segment resulting in severe stenosis proximally. There is critical stenosis/occlusion of the left vertebral artery at the V3/V4 junction with diminutive caliber of the remainder of the V4 segment. The basilar artery is patent with mild irregularity. The major cerebellar arteries appear patent. There is a fetal origin of the left PCA the left PCA appears patent, with mild-to-moderate stenosis of the proximal P2 segment (100-152). The right PCA is not identified proximally there is reconstitution of flow in the P2 segment (100-161). Note that the right PCA is also fetal in origin and  primarily supplied by a now  occluded posterior communicating artery. Venous sinuses: Suboptimally evaluated due to bolus timing. Anatomic variants: As above. Review of the MIP images confirms the above findings IMPRESSION: 1. 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 2. Short-segment occlusion of a right M2 branch proximally and additional focal severe stenosis of an inferior right M2 segment proximally. 3. Occlusion of the right PCA proximally with reconstitution of flow in the P2 segment. Note that the right PCA is also fetal in origin and primarily supplied by a now occluded posterior communicating artery. 4. Additional intracranial atherosclerotic disease resulting in severe stenosis of the left A2 segment, severe stenosis of the right V4 segment, and critical stenosis/occlusion of the left vertebral artery at the V3/V4 junction with diminutive caliber of the remainder of the V4 segment. 5. Calcified plaque in the left carotid bifurcation resulting in approximately 50% stenosis. 6. Severe stenosis at the origin of the right vertebral artery. 7. 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 Recommend vascular surgery referral. Findings were communicated with Dr. Leonel Ramsay at 3:55 p.m. Electronically Signed   By: Valetta Mole M.D.   On: 09/26/2022 16:02   CT HEAD CODE STROKE WO CONTRAST  Addendum Date: 09/26/2022   ADDENDUM REPORT: 09/26/2022 15:57 ADDENDUM: Impression #2 should state chronic infract in the periventricular white matter/right corona radiata Electronically Signed   By: Marin Roberts M.D.   On: 09/26/2022 15:57   Result Date: 09/26/2022 CLINICAL DATA:  Code stroke. EXAM: CT HEAD WITHOUT CONTRAST TECHNIQUE: Contiguous axial images were obtained  from the base of the skull through the vertex without intravenous contrast. RADIATION DOSE REDUCTION: This exam was performed according to the departmental dose-optimization program which includes automated exposure control, adjustment of the mA and/or kV according to patient size and/or use of iterative reconstruction technique. COMPARISON:  08/17/20 CT brain, 08/19/20 MRI brain FINDINGS: Brain: No evidence of acute infarction, hemorrhage, hydrocephalus, extra-axial collection or mass lesion/mass effect. Redemonstrated generalized volume loss with sequela of moderate to severe chronic microvascular ischemic change. Redemonstrated area of acute to subacute infarct in the periventricular white matter/right corona radiata (series 100, image 26). Vascular: No hyperdense vessel or unexpected calcification. Skull: Normal. Negative for fracture or focal lesion. Sinuses/Orbits: Bilateral lens replacements. Paranasal sinuses are clear. Other: None. ASPECTS ([place] Stroke Program Early CT Score): 10 IMPRESSION: 1. No hemorrhage or definitive CT evidence of a new infarct. 2. Redemonstrated area of acute to subacute infarct in the periventricular white matter/right corona radiata. Findings were paged to Dr. Leonel Ramsay on 09/26/22 at 3:31 PM. Electronically Signed: By: Marin Roberts M.D. On: 09/26/2022 15:31    Assessment/Plan 1. Essential hypertension - uncontrolled, goal SBP< 180 - SBP 198/108> hydralazine 10 mg given> 190/92> extra losartan 25 mg given> 154/82 - HCTZ and hydralazine prn stopped during recent hospitalization - start HCTZ 12.5 mg po daily - blood pressures TID x 1 week - bmp 12/04 - may consider increasing losartan in future  2. 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  Lovena Le outpatient-advised daughter to keep existing appointment - speech slowed/slurred, right eye gaze, left hemiparesis - cont skilled nursing   3. Permanent atrial fibrillation (HCC) - CHADS2VASC Score 7, stroke risk 11.2% - cont asa - HR controlled with atenolol and digoxin - see above    Family/ staff Communication: plan discussed with patient and nurse  Labs/tests ordered:  cbc/diff /bmp 12/04, blood pressures TID x 7 days

## 2022-10-04 NOTE — Addendum Note (Signed)
Addended byWindell Moulding E on: 10/04/2022 03:07 PM   Modules accepted: Orders

## 2022-10-05 ENCOUNTER — Encounter: Payer: Medicare Other | Admitting: Nurse Practitioner

## 2022-10-05 DIAGNOSIS — R278 Other lack of coordination: Secondary | ICD-10-CM | POA: Diagnosis not present

## 2022-10-05 DIAGNOSIS — I69391 Dysphagia following cerebral infarction: Secondary | ICD-10-CM | POA: Diagnosis not present

## 2022-10-05 DIAGNOSIS — R1312 Dysphagia, oropharyngeal phase: Secondary | ICD-10-CM | POA: Diagnosis not present

## 2022-10-05 DIAGNOSIS — M62562 Muscle wasting and atrophy, not elsewhere classified, left lower leg: Secondary | ICD-10-CM | POA: Diagnosis not present

## 2022-10-05 DIAGNOSIS — R2689 Other abnormalities of gait and mobility: Secondary | ICD-10-CM | POA: Diagnosis not present

## 2022-10-05 DIAGNOSIS — I6621 Occlusion and stenosis of right posterior cerebral artery: Secondary | ICD-10-CM | POA: Diagnosis not present

## 2022-10-05 DIAGNOSIS — I6521 Occlusion and stenosis of right carotid artery: Secondary | ICD-10-CM | POA: Diagnosis not present

## 2022-10-05 DIAGNOSIS — I69322 Dysarthria following cerebral infarction: Secondary | ICD-10-CM | POA: Diagnosis not present

## 2022-10-05 DIAGNOSIS — I69354 Hemiplegia and hemiparesis following cerebral infarction affecting left non-dominant side: Secondary | ICD-10-CM | POA: Diagnosis not present

## 2022-10-05 DIAGNOSIS — M62561 Muscle wasting and atrophy, not elsewhere classified, right lower leg: Secondary | ICD-10-CM | POA: Diagnosis not present

## 2022-10-06 ENCOUNTER — Encounter: Payer: Self-pay | Admitting: Adult Health

## 2022-10-06 ENCOUNTER — Non-Acute Institutional Stay (SKILLED_NURSING_FACILITY): Payer: Medicare Other | Admitting: Adult Health

## 2022-10-06 ENCOUNTER — Telehealth: Payer: Self-pay | Admitting: Physician Assistant

## 2022-10-06 ENCOUNTER — Telehealth: Payer: Self-pay

## 2022-10-06 DIAGNOSIS — R5383 Other fatigue: Secondary | ICD-10-CM | POA: Diagnosis not present

## 2022-10-06 DIAGNOSIS — I6521 Occlusion and stenosis of right carotid artery: Secondary | ICD-10-CM | POA: Diagnosis not present

## 2022-10-06 DIAGNOSIS — R1312 Dysphagia, oropharyngeal phase: Secondary | ICD-10-CM

## 2022-10-06 DIAGNOSIS — I10 Essential (primary) hypertension: Secondary | ICD-10-CM

## 2022-10-06 DIAGNOSIS — I639 Cerebral infarction, unspecified: Secondary | ICD-10-CM

## 2022-10-06 DIAGNOSIS — R29818 Other symptoms and signs involving the nervous system: Secondary | ICD-10-CM | POA: Diagnosis not present

## 2022-10-06 DIAGNOSIS — I69354 Hemiplegia and hemiparesis following cerebral infarction affecting left non-dominant side: Secondary | ICD-10-CM | POA: Diagnosis not present

## 2022-10-06 DIAGNOSIS — Z79899 Other long term (current) drug therapy: Secondary | ICD-10-CM | POA: Diagnosis not present

## 2022-10-06 DIAGNOSIS — I69391 Dysphagia following cerebral infarction: Secondary | ICD-10-CM | POA: Diagnosis not present

## 2022-10-06 DIAGNOSIS — I6621 Occlusion and stenosis of right posterior cerebral artery: Secondary | ICD-10-CM | POA: Diagnosis not present

## 2022-10-06 DIAGNOSIS — I69322 Dysarthria following cerebral infarction: Secondary | ICD-10-CM | POA: Diagnosis not present

## 2022-10-06 NOTE — Telephone Encounter (Signed)
Spoke to [employer].Stated patient had a recent stroke and cannot walk.Stated she has a post hospital appointment with Nicholes Rough PA 12/5.She wanted to know if appointment can be changed to a virtual visit.I will send message to Nicholes Rough PA for advice.

## 2022-10-06 NOTE — Patient Outreach (Signed)
Received a red flag Emmi stroke notification for Mr. Luepke. I have assigned Enzo Montgomery, RN to call for follow up and determine if there are any Case Management needs.    [facility], [place], [facility] [phone]

## 2022-10-06 NOTE — Progress Notes (Addendum)
Location:  Occupational psychologist of Service:  SNF (31) Provider: Earney Hamburg, MD  Patient Care Team: [name], MD as PCP - General (Internal Medicine) [name], MD as PCP - Electrophysiology (Cardiology) [name], MD as Consulting Physician (Dermatology) [name], MD as Consulting Physician (Cardiology) [facility], [facility] [name], MD as Consulting Physician (Dermatology) Florance, Tomasa Blase, RN as [place] [facility]  Extended Emergency Contact Information Primary Emergency Contact: [name] of [place] Phone: [phone] Mobile Phone: [phone] Relation: Daughter Secondary Emergency Contact: [name],[name]  [name] of [place] Phone: [phone] Mobile Phone: [phone] Relation: Son  Code Status:  DNR Goals of care: Advanced Directive information    10/06/2022   10:32 AM  Advanced Directives  Does Patient Have a Medical Advance Directive? Yes  Type of Paramedic of [place];Living will;Out of facility DNR (pink MOST or yellow form)  Does patient want to make changes to medical advance directive? No - Guardian declined  Copy of [place] in Chart? Yes - validated most recent copy scanned in chart (See row information)     Chief Complaint  Patient presents with   Acute Visit    Lethargy    Quality Metric Gaps    Discussed the need for care gaps    HPI:  Pt is a 88 y.o. 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   Discharged from the hospital back to [facility] 10/02/22 to skilled care diagnosed with a right large PCA infarct s/p TNK . She presented with  dysarthria, left facial droop and right gaze preference. This has continued since she returned to [facility] with left sided neglect and swallowing issues.   Nurse called to report that Susan Conway was more lethargic and unable to arouse. She was not alert enough to eat or drink.  BP check this am 192/104, repeat 173/70 Other vitals table. No resp distress.   Since arriving from the hospital intake has been low, needing spoon fed food/liquids. Not making progress. Seems to be sleepier at the week goes on. BP has been running high, placed on hctz 11/29.     Past Medical History:  Diagnosis Date   Actinic keratosis 05/25/2014   Anxiety    Atrial fibrillation ([place]) 09/21/2010   Basal cell carcinoma 05/25/2014   Multiple removed by Dr. Danella Sensing in March 2015: right neck, left neck, scalp    Bell's palsy 07/18/1979   Cervicalgia 01/31/2012   Closed fracture of lumbar vertebra without mention of spinal cord injury 07/17/2005   Conjunctiva disorder 12/26/2010   Coronary atherosclerosis of native coronary artery 07/17/1997   Cramp of limb 08/16/2009   Degeneration of lumbar or lumbosacral intervertebral disc 01/31/2012   Disturbance of skin sensation 08/2009   Dizziness and giddiness 11/08/2011   vertigo   External hemorrhoids without mention of complication 33/35/4562   External hemorrhoids without mention of complication 56/38/9373   Ganglion of tendon sheath 08/16/2009   Leg cramp 10/27/2013   Most frequently the right leg.    Long term (current) use of anticoagulants 09/2010   Lumbago 07/2009   Meralgia paresthetica 07/2007   MI, old    Myalgia and myositis,  unspecified 01/17/2012   Osteoporosis    Other abnormal blood chemistry 04/08/2012   Other abnormal blood chemistry 2013   hyperglycemia   Other disorder of muscle, ligament, and fascia 04/08/2012   Other specified cardiac dysrhythmias(427.89) 06/27/2010   Pacemaker 05/06/2020   Pain in joint, ankle and foot 10/27/2013    Bilateral since 1998    Pain in joint, shoulder region 08/12/2012   Pain in joint, upper arm 12/26/2010   Pain in limb 01/11/2011   Pain in thoracic spine 01/31/2012   Pathologic fracture of vertebrae 01/22/2012   PVC's (premature ventricular contractions)    Rash and other nonspecific skin eruption 08/02/2011   Senile osteoporosis 07/17/1993   Stroke ([place])    Unspecified essential hypertension 07/17/1997   Varicose veins of lower extremities 08/16/2009   Varicose veins of lower extremities 08/16/2009   Past Surgical History:  Procedure Laterality Date   COLONOSCOPY WITH PROPOFOL N/A 10/19/2015   Procedure: COLONOSCOPY WITH PROPOFOL;  Surgeon: Gatha [place], MD;  Location: [facility];  Service: Endoscopy;  Laterality: N/A;   CORONARY ARTERY BYPASS GRAFT  1998   x2; LIMA to LAD; SVG to diagonal off bypass   HEMORRHOID SURGERY  08/26/2012   Dr. Brantley Stage   LOOP RECORDER INSERTION N/A 05/13/2018   Procedure: LOOP RECORDER INSERTION;  Surgeon: [name], MD;  Location: [facility];  Service: Cardiovascular;  Laterality: N/A;   LOOP RECORDER REMOVAL N/A 05/26/2019   Procedure: LOOP RECORDER REMOVAL;  Surgeon: [name], MD;  Location: [facility];  Service: Cardiovascular;  Laterality: N/A;   MASS EXCISION Left 11/23/2015   Procedure: LEFT WRIST EXCISION CYST;  Surgeon: Leanora Cover, MD;  Location: [facility];  Service: Orthopedics;  Laterality: Left;   PACEMAKER IMPLANT N/A 05/26/2019   Procedure: PACEMAKER IMPLANT;  Surgeon: [name], MD;  Location: [facility];  Service: Cardiovascular;  Laterality: N/A;   SKIN BIOPSY  01/29/14   (R) neck; (R) scalp, 2 (L) neck; shave biopsy Superficial basal cell carcinoma Dr. Danella Sensing   TONSILLECTOMY  [phone]    Allergies  Allergen Reactions   Iodinated Contrast Media Nausea Only and Other (See Comments)    Severe nausea and also passed out   Iodine Nausea Only and Other (See Comments)     "Allergic," per [facility]- PASSED OUT   Lodine [Etodolac] Other (See Comments)    "Allergic," per [facility]   Other Other (See Comments)    Seasonal Allergies   Bactrim Rash   Relafen [Nabumetone] Rash   Sulfamethoxazole-Trimethoprim Rash    In matrix   Sulfanilamide Rash    Outpatient Encounter Medications as of 10/06/2022  Medication Sig   acetaminophen (TYLENOL) 325 MG tablet Take 650 mg by mouth 3 (three) times daily as needed (for pain).   amLODipine (NORVASC) 5 MG tablet Take 5 mg by mouth daily.   Artificial Saliva (BIOTENE DRY MOUTH MOISTURIZING) SOLN Use as directed 1-2 sprays in the mouth or throat 3 (three) times daily.   aspirin 81 MG chewable tablet Chew 1 tablet (81 mg total) by mouth daily.   atenolol (TENORMIN) 50 MG tablet Take 1 tablet (50 mg total) by mouth 2 (two) times daily.   atorvastatin (LIPITOR) 40 MG tablet Take 1 tablet (40 mg total) by mouth daily.   Calcium Carb-Cholecalciferol (CALCIUM 500 + D3 PO) Take 1 capsule by mouth in the morning.   diclofenac Sodium (VOLTAREN) 1 % GEL Apply 2 g topically  as needed (BID PRN). (Patient taking differently: Apply 2 g topically 4 (four) times daily as needed (BID PRN).)   digoxin (LANOXIN) 0.125 MG tablet Take 1 tablet (0.125 mg total) by mouth every other day.   docusate (COLACE) 50 MG/5ML liquid Take 100 mg by mouth daily.   hydrALAZINE (APRESOLINE) 10 MG tablet Take 10 mg by mouth 3 (three) times daily as needed (SBP>180).   hydrochlorothiazide (HYDRODIURIL) 12.5 MG tablet Take 1 tablet (12.5 mg total) by mouth daily.   levocetirizine (XYZAL) 5 MG tablet Take 5 mg by mouth at bedtime.   lidocaine (LIDODERM) 5 % Place 1 patch onto the skin as needed (for back pain- Remove & Discard patch within 12 hours or as directed by MD).   LORazepam (ATIVAN) 1 MG tablet Take 1 tablet (1 mg total) by mouth at bedtime.   losartan (COZAAR) 25 MG tablet Take 25 mg by mouth daily.   polyethylene glycol (MIRALAX / GLYCOLAX) 17 g packet Take 17 g by  mouth See admin instructions. Mix 17 grams of powder into 4-6 oz of beverage of choice and drink once a day   potassium chloride SA (KLOR-CON) 20 MEQ tablet Take 10 mEq by mouth every evening.   saline (AYR) GEL Place 1 Application into both nostrils 2 (two) times daily.   [DISCONTINUED] docusate sodium (COLACE) 100 MG capsule Take 1 capsule (100 mg total) by mouth daily.   No facility-administered encounter medications on file as of 10/06/2022.    Review of Systems  Unable to perform ROS: Acuity of condition    Immunization History  Administered Date(s) Administered   Influenza Whole 08/06/2012   Influenza, High Dose Seasonal PF 08/15/2019, 08/27/2020   Influenza,inj,Quad PF,6+ Mos 08/30/2018   Influenza-Unspecified 08/06/2013, 08/24/2014, 08/26/2015, 08/31/2016, 08/27/2017, 08/27/2020   Moderna Sars-Covid-2 Vaccination 11/16/2019, 12/16/2019, 09/16/2020   Pneumococcal Conjugate-13 09/22/2015   Pneumococcal Polysaccharide-23 08/29/2006   Td 10/07/2007   Zoster Recombinat (Shingrix) 02/12/2018, 05/13/2018   Zoster, Live 03/12/2008   Pertinent  Health Maintenance Due  Topic Date Due   INFLUENZA VACCINE  06/06/2022   DEXA SCAN  Completed      10/01/2022    8:00 PM 10/02/2022    8:00 AM 10/03/2022   10:49 AM 10/04/2022    9:50 AM 10/06/2022   10:31 AM  Fall Risk  Falls in the past year?   '1 1 1  '$ Was there an injury with Fall?   '1 1 1  '$ Fall Risk Category Calculator   '3 3 2  '$ Fall Risk Category   High High Moderate  Patient Fall Risk Level High fall risk High fall risk High fall risk High fall risk High fall risk  Patient at Risk for Falls Due to   History of fall(s) History of fall(s) History of fall(s)  Fall risk Follow up   Falls evaluation completed Falls evaluation completed Falls evaluation completed   Functional Status Survey:    Vitals:   10/06/22 1007  BP: 125/77  Pulse: 66  Resp: 16  Temp: 97.9 F (36.6 C)  TempSrc: Temporal  SpO2: 95%  Weight: 130 lb 12.8  oz (59.3 kg)  Height: '5\' 6"'$  (1.676 m)   Body mass index is 21.11 kg/m. Physical Exam Vitals and nursing note reviewed.  Constitutional:      General: She is not in acute distress.    Appearance: She is not diaphoretic.  HENT:     Head: Normocephalic and atraumatic.     Mouth/Throat:  Mouth: Mucous membranes are moist.     Pharynx: Oropharynx is clear.  Eyes:     Comments: Left pupil sluggish. Right pupil brisk. Equal size.   Neck:     Vascular: No JVD.  Cardiovascular:     Rate and Rhythm: Normal rate. Rhythm irregular.     Heart sounds: No murmur heard. Pulmonary:     Effort: Pulmonary effort is normal. No respiratory distress.     Breath sounds: Normal breath sounds. No wheezing.  Abdominal:     General: Bowel sounds are normal. There is no distension.     Palpations: Abdomen is soft.     Tenderness: There is no abdominal tenderness.  Musculoskeletal:     Right lower leg: No edema.     Left lower leg: No edema.  Skin:    General: Skin is warm and dry.  Neurological:     Mental Status: She is alert.     Comments: Diminished DTRS, lethargic, able to raise eyebrows when name called but not open eyes, follow commands, snoring. Left sided neglect.      Labs reviewed: Recent Labs    09/30/22 0931 10/01/22 0806 10/02/22 0900  NA 141 142 142  K 3.6 3.3* 3.9  CL 108 111 116*  CO2 22 20* 19*  GLUCOSE 107* 114* 108*  BUN 34* 36* 35*  CREATININE 1.25* 1.21* 1.09*  CALCIUM 8.5* 8.2* 8.2*   Recent Labs    08/01/22 0000 09/26/22 1539  AST 18 17  ALT 8 12  ALKPHOS 86 71  BILITOT  --  0.3  PROT  --  7.1  ALBUMIN 3.8 3.1*   Recent Labs    09/26/22 1539 09/29/22 0513 09/30/22 0931 10/01/22 0806 10/02/22 0900  WBC 8.2   < > 10.7* 11.0* 9.9  NEUTROABS 4.2  --   --   --   --   HGB 10.7*   < > 9.2* 8.5* 8.8*  HCT 33.5*   < > 28.9* 25.7* 27.2*  MCV 87.7   < > 88.1 87.1 86.6  PLT 151   < > 96* 87* 109*   < > = values in this interval not displayed.   Lab  Results  Component Value Date   TSH 2.87 02/03/2022   Lab Results  Component Value Date   HGBA1C 5.7 (H) 09/26/2022   Lab Results  Component Value Date   CHOL 161 09/27/2022   HDL 39 (L) 09/27/2022   LDLCALC 114 (H) 09/27/2022   LDLDIRECT 81.3 08/19/2020   TRIG 39 09/27/2022   CHOLHDL 4.1 09/27/2022    Significant Diagnostic Results in last 30 days:  DG Swallowing Func-Speech Pathology  Result Date: 09/29/2022 Table formatting from the original result was not included. Objective Swallowing Evaluation: Type of Study: MBS-Modified Barium Swallow Study  Patient Details Name: Susan Conway MRN: [id number] Date of Birth: [birth year] Today's Date: 09/29/2022 Time: SLP Start Time (ACUTE ONLY): 0836 -SLP Stop Time (ACUTE ONLY): 0901 SLP Time Calculation (min) (ACUTE ONLY): 25 min Past Medical History: Past Medical History: Diagnosis Date  Actinic keratosis 05/25/2014  Anxiety   Atrial fibrillation ([place]) 09/21/2010  Basal cell carcinoma 05/25/2014  Multiple removed by Dr. Danella Sensing in March 2015: right neck, left neck, scalp   Bell's palsy 07/18/1979  Cervicalgia 01/31/2012  Closed fracture of lumbar vertebra without mention of spinal cord injury 07/17/2005  Conjunctiva disorder 12/26/2010  Coronary atherosclerosis of native coronary artery 07/17/1997  Cramp of limb 08/16/2009  Degeneration of lumbar or  lumbosacral intervertebral disc 01/31/2012  Disturbance of skin sensation 08/2009  Dizziness and giddiness 11/08/2011  vertigo  External hemorrhoids without mention of complication 17/00/1749  External hemorrhoids without mention of complication 44/96/7591  Ganglion of tendon sheath 08/16/2009  Leg cramp 10/27/2013  Most frequently the right leg.   Long term (current) use of anticoagulants 09/2010  Lumbago 07/2009  Meralgia paresthetica 07/2007  MI, old   Myalgia and myositis, unspecified 01/17/2012  Osteoporosis   Other abnormal blood chemistry 04/08/2012  Other abnormal blood chemistry 2013   hyperglycemia  Other disorder of muscle, ligament, and fascia 04/08/2012  Other specified cardiac dysrhythmias(427.89) 06/27/2010  Pacemaker 05/06/2020  Pain in joint, ankle and foot 10/27/2013  Bilateral since 1998   Pain in joint, shoulder region 08/12/2012  Pain in joint, upper arm 12/26/2010  Pain in limb 01/11/2011  Pain in thoracic spine 01/31/2012  Pathologic fracture of vertebrae 01/22/2012  PVC's (premature ventricular contractions)   Rash and other nonspecific skin eruption 08/02/2011  Senile osteoporosis 07/17/1993  Stroke ([place])   Unspecified essential hypertension 07/17/1997  Varicose veins of lower extremities 08/16/2009  Varicose veins of lower extremities 08/16/2009 Past Surgical History: Past Surgical History: Procedure Laterality Date  COLONOSCOPY WITH PROPOFOL N/A 10/19/2015  Procedure: COLONOSCOPY WITH PROPOFOL;  Surgeon: Gatha [place], MD;  [facility]: [facility];  Service: Endoscopy;  Laterality: N/A;  CORONARY ARTERY BYPASS GRAFT  1998  x2; LIMA to LAD; SVG to diagonal off bypass  HEMORRHOID SURGERY  08/26/2012  Dr. Brantley Stage  LOOP RECORDER INSERTION N/A 05/13/2018  Procedure: LOOP RECORDER INSERTION;  Surgeon: [name], MD;  Location: [facility];  Service: Cardiovascular;  Laterality: N/A;  LOOP RECORDER REMOVAL N/A 05/26/2019  Procedure: LOOP RECORDER REMOVAL;  Surgeon: [name], MD;  Location: [facility];  Service: Cardiovascular;  Laterality: N/A;  MASS EXCISION Left 11/23/2015  Procedure: LEFT WRIST EXCISION CYST;  Surgeon: Leanora Cover, MD;  Location: [facility];  Service: Orthopedics;  Laterality: Left;  PACEMAKER IMPLANT N/A 05/26/2019  Procedure: PACEMAKER IMPLANT;  Surgeon: [name], MD;  Location: [facility];  Service: Cardiovascular;  Laterality: N/A;  SKIN BIOPSY  01/29/14  (R) neck; (R) scalp, 2 (L) neck; shave biopsy Superficial basal cell carcinoma Dr. Danella Sensing  TONSILLECTOMY  [phone] HPI: Pt is a 88 year old female who  presented with dysarthria, left facial droop, and right gaze preference. TNK given.CT head negative for acute changes. CTA neck 11/21: Occlusion of the right internal carotid artery, ccclusion of the right PCA proximally with reconstitution of flow in the P2 segment, short-segment occlusion of a right M2 branch proximally and additional focal severe stenosis of an inferior right M2 segment  proximally. PMH: atrial fibrillation not on anticoagulation due to a previous bleeding left knee Bakers cyst but maintained on aspirin, right basal ganglia ischemic infarction in 2021 with resultant left upper extremity weakness and spasticity resulting in contracture as well as left lower extremity weakness. Pt seen for dysphagia in 2021 and was discharged on dysphagia 3 and thin liquids. Per pt's daughter, the pt subsequently received SLP services and was advanced to regular and thin liquids.  Subjective: pt orally holding during MBS  Recommendations for follow up therapy are one component of a multi-disciplinary discharge planning process, led by the attending physician.  Recommendations may be updated based on patient status, additional functional criteria and insurance authorization. Assessment / Plan / Recommendation   09/29/2022   9:00 AM Clinical Impressions Clinical Impression  MBS was completed in limited fashion given oral holding requiring suctioning of boluses from oral cavity. Daughter present and believes this may be related to taste and/or level of alertness. Oral deficits include reduced lingual manipulation and posterior propulsion. There is reduced bolus cohesion and it takes a long time and some verbal cueing to get the pt to swallow liquids. Oral residue is present with liquids as well. Pt does not swallow purees despite offering dry spoon for tactile cueing and liquid washes (would not open her mouth to accept a cup, took minimal liquid off the spoon), requiring yankauer to remove. Pharyngeal phase could not  be viewed with purees. In swallowing a small amount of nectar thick liquids and purees by spoon, pt had aspiration before the swallow with nectar thick liquids with a weak, delayed cough that did not clear the airway. Suspect that this was a result of premature spillage as it occurred very quickly - before fluoro could be turned on, and while the remaining bolus was being held orally. Pt had small amounts of penetration with honey thick liquids but it all was ejected spontaneously (PAS 2). Discussed with daughter the limitations of this study, but that we could try staying on Dys 1 diet (purees) since she has been swallowing some of this upstairs, as well as honey thick liquids. Would monitor closely as a team for any signs of intolerance. SLP Visit Diagnosis Dysphagia, oropharyngeal phase (R13.12) Impact on safety and function Moderate aspiration risk;Risk for inadequate nutrition/hydration     09/29/2022   9:00 AM Treatment Recommendations Treatment Recommendations Therapy as outlined in treatment plan below     09/29/2022   9:00 AM Prognosis Prognosis for Safe Diet Advancement Good Barriers to Reach Goals Cognitive deficits;Severity of deficits   09/29/2022   9:00 AM Diet Recommendations SLP Diet Recommendations Dysphagia 1 (Puree) solids;Honey thick liquids Liquid Administration via Cup;Spoon Medication Administration Crushed with puree Compensations Slow rate;Small sips/bites;Follow solids with liquid Postural Changes Seated upright at 90 degrees;Remain semi-upright after after feeds/meals (Comment)     09/29/2022   9:00 AM Other Recommendations Oral Care Recommendations Oral care BID Follow Up Recommendations Skilled nursing-short term rehab (<3 hours/day) Functional Status Assessment Patient has had a recent decline in their functional status and demonstrates the ability to make significant improvements in function in a reasonable and predictable amount of time.   09/29/2022   9:00 AM Frequency and Duration   Speech Therapy Frequency (ACUTE ONLY) min 2x/week Treatment Duration 2 weeks     09/29/2022   9:00 AM Oral Phase Oral Phase Impaired Oral - Honey Teaspoon Weak lingual manipulation;Reduced posterior propulsion;Decreased bolus cohesion;Lingual/palatal residue;Delayed oral transit Oral - Nectar Teaspoon Weak lingual manipulation;Reduced posterior propulsion;Decreased bolus cohesion;Lingual/palatal residue;Delayed oral transit Oral - Puree Holding of bolus;Other (Comment)    09/29/2022   9:00 AM Pharyngeal Phase Pharyngeal Phase Impaired Pharyngeal- Honey Teaspoon Penetration/Aspiration before swallow;Reduced epiglottic inversion;Reduced tongue base retraction;Reduced airway/laryngeal closure;Pharyngeal residue - valleculae Pharyngeal Material enters airway, remains ABOVE vocal cords then ejected out Pharyngeal- Nectar Teaspoon Penetration/Aspiration before swallow;Reduced epiglottic inversion;Reduced tongue base retraction;Reduced airway/laryngeal closure;Pharyngeal residue - valleculae Pharyngeal Material enters airway, passes BELOW cords and not ejected out despite cough attempt by patient    09/29/2022   9:00 AM Cervical Esophageal Phase  Cervical Esophageal Phase [facility] Osie Bond., M.A. [place] Office [phone] Secure chat preferred 09/29/2022, 9:48 AM                     DG  CHEST PORT 1 VIEW  Result Date: 09/29/2022 CLINICAL DATA:  Aspiration into lower respiratory tract. EXAM: PORTABLE CHEST 1 VIEW COMPARISON:  08/25/2020. FINDINGS: The heart is enlarged and the mediastinal contour stable. Atherosclerotic calcification of the aorta is noted. No consolidation, effusion, or pneumothorax. No acute osseous abnormality. A single lead pacemaker device is present over the left chest. Sternotomy wires are present over the midline. IMPRESSION: 1. No active disease. 2. Cardiomegaly. Electronically Signed   By: Brett Fairy M.D.   On: 09/29/2022 01:05   ECHOCARDIOGRAM COMPLETE  Result  Date: 09/27/2022    ECHOCARDIOGRAM REPORT   Patient Name:   Susan Conway Date of Exam: 09/27/2022 Medical Rec #:  [id number]   Height:       66.0 in Accession #:    [id number]  Weight:       131.4 lb Date of Birth:  [birth year]   BSA:          1.673 m Patient Age:    43 years    BP:           167/82 mmHg Patient Gender: F           HR:           68 bpm. Exam Location:  Inpatient Procedure: 2D Echo Indications:    stroke  History:        Patient has prior history of Echocardiogram examinations, most                 recent 08/20/2020. CAD, Pacemaker, Arrythmias:PVC and Atrial                 Fibrillation; Risk Factors:Hypertension and Dyslipidemia.  Sonographer:    Johny Chess RDCS Referring Phys: [phone] MCNEILL P KIRKPATRICK  Sonographer Comments: Image acquisition challenging due to uncooperative patient. IMPRESSIONS  1. Left ventricular ejection fraction, by estimation, is 60 to 65%. The left ventricle has normal function. The left ventricle has no regional wall motion abnormalities. Left ventricular diastolic parameters are indeterminate.  2. Right ventricular systolic function is normal. The right ventricular size is normal. There is normal pulmonary artery systolic pressure.  3. Left atrial size was moderately dilated.  4. Right atrial size was severely dilated.  5. The mitral valve is degenerative. Trivial mitral valve regurgitation. No evidence of mitral stenosis.  6. Tricuspid valve regurgitation is moderate to severe.  7. The aortic valve is tricuspid. There is severe calcifcation of the aortic valve. Aortic valve regurgitation is not visualized. Aortic valve sclerosis/calcification is present, without any evidence of aortic stenosis. Comparison(s): No significant change from prior study. FINDINGS  Left Ventricle: Left ventricular ejection fraction, by estimation, is 60 to 65%. The left ventricle has normal function. The left ventricle has no regional wall motion abnormalities. The left ventricular  internal cavity size was normal in size. There is  no left ventricular hypertrophy. Left ventricular diastolic parameters are indeterminate. Right Ventricle: The right ventricular size is normal. No increase in right ventricular wall thickness. Right ventricular systolic function is normal. There is normal pulmonary artery systolic pressure. The tricuspid regurgitant velocity is 2.73 m/s, and  with an assumed right atrial pressure of 3 mmHg, the estimated right ventricular systolic pressure is 07.3 mmHg. Left Atrium: Left atrial size was moderately dilated. Right Atrium: Right atrial size was severely dilated. Pericardium: There is no evidence of pericardial effusion. Mitral Valve: The mitral valve is degenerative in appearance. Mild to moderate mitral annular calcification. Trivial mitral valve regurgitation. No  evidence of mitral valve stenosis. Tricuspid Valve: The tricuspid valve is grossly normal. Tricuspid valve regurgitation is moderate to severe. Aortic Valve: The aortic valve is tricuspid. There is severe calcifcation of the aortic valve. Aortic valve regurgitation is not visualized. Aortic valve sclerosis/calcification is present, without any evidence of aortic stenosis. Pulmonic Valve: The pulmonic valve was normal in structure. Pulmonic valve regurgitation is trivial. No evidence of pulmonic stenosis. Aorta: The aortic root is normal in size and structure. IAS/Shunts: No atrial level shunt detected by color flow Doppler. Additional Comments: A device lead is visualized in the right ventricle and right atrium.  LEFT VENTRICLE PLAX 2D LVIDd:         3.90 cm LVIDs:         2.20 cm LV PW:         1.00 cm LV IVS:        0.90 cm LVOT diam:     1.60 cm LV SV:         28 LV SV Index:   17 LVOT Area:     2.01 cm  RIGHT VENTRICLE            IVC RV Basal diam:  2.90 cm    IVC diam: 1.80 cm RV S prime:     8.27 cm/s TAPSE (M-mode): 1.0 cm LEFT ATRIUM             Index        RIGHT ATRIUM           Index LA diam:         4.40 cm 2.63 cm/m   RA Area:     25.40 cm LA Vol (A2C):   69.8 ml 41.73 ml/m  RA Volume:   80.20 ml  47.94 ml/m LA Vol (A4C):   61.8 ml 36.94 ml/m LA Biplane Vol: 65.7 ml 39.28 ml/m  AORTIC VALVE LVOT Vmax:   75.50 cm/s LVOT Vmean:  48.400 cm/s LVOT VTI:    0.139 m  AORTA Ao Root diam: 2.90 cm Ao Asc diam:  3.10 cm TRICUSPID VALVE TR Peak grad:   29.8 mmHg TR Vmax:        273.00 cm/s  SHUNTS Systemic VTI:  0.14 m Systemic Diam: 1.60 cm Rudean Haskell MD Electronically signed by Rudean Haskell MD Signature Date/Time: 09/27/2022/4:34:47 PM    Final    CT HEAD WO CONTRAST  Result Date: 09/27/2022 CLINICAL DATA:  Stroke, follow up EXAM: CT HEAD WITHOUT CONTRAST TECHNIQUE: Contiguous axial images were obtained from the base of the skull through the vertex without intravenous contrast. RADIATION DOSE REDUCTION: This exam was performed according to the departmental dose-optimization program which includes automated exposure control, adjustment of the mA and/or kV according to patient size and/or use of iterative reconstruction technique. COMPARISON:  November 21, 23. FINDINGS: Brain: Large acute right PCA territory infarct which involves the right thalamus held medial right temporal lobe, right hippocampus, and right occipital lobe. No evidence of acute hemorrhage, mass lesion or midline shift. No hydrocephalus. Vascular: Hyperdense right PCA. Skull: No acute fracture. Sinuses/Orbits: Bilateral sphenoid sinus opacification. Other: No acute orbital findings. IMPRESSION: 1. Large acute right PCA territory infarct. 2. Hyperdense right PCA, compatible with known occlusion. These results will be called to the ordering clinician or representative by the Radiologist Assistant, and communication documented in the PACS or [employer]. Electronically Signed   By: Margaretha Sheffield M.D.   On: 09/27/2022 13:53   CT ANGIO HEAD NECK W WO  CM (CODE STROKE)  Result Date: 09/26/2022 CLINICAL DATA:  Code  stroke EXAM: CT ANGIOGRAPHY HEAD AND NECK TECHNIQUE: Multidetector CT imaging of the head and neck was performed using the standard protocol during bolus administration of intravenous contrast. Multiplanar CT image reconstructions and MIPs were obtained to evaluate the vascular anatomy. Carotid stenosis measurements (when applicable) are obtained utilizing NASCET criteria, using the distal internal carotid diameter as the denominator. RADIATION DOSE REDUCTION: This exam was performed according to the departmental dose-optimization program which includes automated exposure control, adjustment of the mA and/or kV according to patient size and/or use of iterative reconstruction technique. CONTRAST:  65m OMNIPAQUE IOHEXOL 350 MG/ML SOLN COMPARISON:  Same-day noncontrast CT head, MR head and MRA head/neck 08/19/2020 FINDINGS: CTA NECK FINDINGS Aortic arch: There is calcified plaque in the imaged aortic arch. 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 The origins of the major branch vessels are patent with scattered soft and calcified plaque but no high-grade stenosis. Right carotid system: The right common carotid artery is patent but poorly opacified due to the downstream occlusion. The right internal carotid artery is occluded throughout its course. The external carotid artery is patent but poorly opacified. Left carotid system: The left common carotid artery is patent. There is calcified plaque in the proximal internal carotid artery resulting in up to approximately 50% stenosis. The distal internal carotid artery is patent but tortuous in the neck. The external carotid artery is patent. There is no dissection or aneurysm. Vertebral arteries: There is severe stenosis of the origin of the right vertebral artery and  calcified plaque in the V1 segment resulting in mild stenosis. There is additional calcified plaque in the V3 segment resulting in mild-to-moderate stenosis. The left vertebral artery is patent without hemodynamically significant stenosis or occlusion in the neck. There is no evidence of dissection or aneurysm. Skeleton: There is multilevel degenerative change of the cervical spine. There is no acute osseous abnormality or suspicious osseous lesion. There is no visible canal hematoma. Other neck: The soft tissues are unremarkable. Upper chest: There is scarring in the lung apices. Review of the MIP images confirms the above findings CTA HEAD FINDINGS Anterior circulation: The right ICA is occluded throughout its course with reconstitution of flow at the M1 segment. There is short-segment occlusion of a right M2 branch proximally (8-108, 7-91). There is additional focal severe stenosis of an inferior right M2 segment proximally (7-96). There is atherosclerotic irregularity and narrowing in the remainder of the right MCA branches. There is calcified plaque in the left intracranial ICA resulting in up to mild-to-moderate stenosis of the supraclinoid segment. There is mild stenosis of the left M1 segment. The distal left MCA branches appear patent without proximal high-grade stenosis or occlusion. The bilateral ACAs are patent with focal severe stenosis of the left A2 segment (7-67). There is no other proximal high-grade stenosis or occlusion. There is no aneurysm or AVM. Posterior circulation: There is plaque in the right V4 segment resulting in severe stenosis proximally. There is critical stenosis/occlusion of the left vertebral artery at the V3/V4 junction with diminutive caliber of the remainder of the V4 segment. The basilar artery is patent with mild irregularity. The major cerebellar arteries appear patent. There is a fetal origin of the left PCA the left PCA appears patent, with  mild-to-moderate stenosis of the  proximal P2 segment (100-152). The right PCA is not identified proximally there is reconstitution of flow in the P2 segment (100-161). Note that the right PCA is also fetal in origin and primarily supplied by a now occluded posterior communicating artery. Venous sinuses: Suboptimally evaluated due to bolus timing. Anatomic variants: As above. Review of the MIP images confirms the above findings IMPRESSION: 1. 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 2. Short-segment occlusion of a right M2 branch proximally and additional focal severe stenosis of an inferior right M2 segment proximally. 3. Occlusion of the right PCA proximally with reconstitution of flow in the P2 segment. Note that the right PCA is also fetal in origin and primarily supplied by a now occluded posterior communicating artery. 4. Additional intracranial atherosclerotic disease resulting in severe stenosis of the left A2 segment, severe stenosis of the right V4 segment, and critical stenosis/occlusion of the left vertebral artery at the V3/V4 junction with diminutive caliber of the remainder of the V4 segment. 5. Calcified plaque in the left carotid bifurcation resulting in approximately 50% stenosis. 6. Severe stenosis at the origin of the right vertebral artery. 7. 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 Recommend vascular surgery referral. Findings were communicated with Dr. Leonel Ramsay at 3:55 p.m. Electronically Signed   By: Valetta Mole M.D.   On: 09/26/2022 16:02   CT HEAD CODE STROKE WO CONTRAST  Addendum Date: 09/26/2022   ADDENDUM REPORT: 09/26/2022 15:57 ADDENDUM: Impression #2 should state chronic infract in the periventricular white matter/right corona radiata Electronically  Signed   By: Marin Roberts M.D.   On: 09/26/2022 15:57   Result Date: 09/26/2022 CLINICAL DATA:  Code stroke. EXAM: CT HEAD WITHOUT CONTRAST TECHNIQUE: Contiguous axial images were obtained from the base of the skull through the vertex without intravenous contrast. RADIATION DOSE REDUCTION: This exam was performed according to the departmental dose-optimization program which includes automated exposure control, adjustment of the mA and/or kV according to patient size and/or use of iterative reconstruction technique. COMPARISON:  08/17/20 CT brain, 08/19/20 MRI brain FINDINGS: Brain: No evidence of acute infarction, hemorrhage, hydrocephalus, extra-axial collection or mass lesion/mass effect. Redemonstrated generalized volume loss with sequela of moderate to severe chronic microvascular ischemic change. Redemonstrated area of acute to subacute infarct in the periventricular white matter/right corona radiata (series 100, image 26). Vascular: No hyperdense vessel or unexpected calcification. Skull: Normal. Negative for fracture or focal lesion. Sinuses/Orbits: Bilateral lens replacements. Paranasal sinuses are clear. Other: None. ASPECTS ([place] Stroke Program Early CT Score): 10 IMPRESSION: 1. No hemorrhage or definitive CT evidence of a new infarct. 2. Redemonstrated area of acute to subacute infarct in the periventricular white matter/right corona radiata. Findings were paged to Dr. Leonel Ramsay on 09/26/22 at 3:31 PM. Electronically Signed: By: Marin Roberts M.D. On: 09/26/2022 15:31    Assessment/Plan  1. Lethargy With high bp ?another CVA vs lab abnormality due to poor intake Went over options with her daughter. We agreed that we should avoid any further hospitalizations due to her age and goals of care. Her daughter noted that she did wake up and eat later in the day on 11/30 and was able to talk and wants to give her a little more time. If not improving will order  hospice care. She is also interested  in ensuring that morphine is available when needed. At this time she is comfortable.   2. Neurologic deficit due to acute ischemic cerebrovascular accident (CVA) ([place]) Left sided neglect/hemiparesis, difficulty swallowing, lethargy Has not made progress with therapy  On statin and asa, not ON DOAC due to hx of bleeding risk. Was going to discuss this again with cardiology in apt but this may be futile at this point.   3. Essential hypertension Improved after morning meds At this time not able to swallow so would not make further changes Possible hospice admit if not improving by the end of the day  4. Oropharyngeal dysphagia Worsening with lethargy On D1 thickened liquid but not able to swallow.   Family/ staff Communication: [name] daughter and POA  Addendum: Received secure text from nurse indicating resident's daughter may be interested in IVF briefly due to poor intake. Order given for stat BMP. If no intake may give one liter of fluid NS at 80 cc/hr, if no response would not continue due to overall poor prognosis.

## 2022-10-06 NOTE — Telephone Encounter (Signed)
[facility] called about patient's appt she has coming up on 12/5.  They state she is not in any condition to be transported to the office, they are wondering if patient can have her visit virtually.

## 2022-10-06 NOTE — Patient Outreach (Signed)
  [facility] Stroke Care Coordination Follow Up  10/06/2022 Name:  [name] MRN:  [id number] DOB:  02-Oct-1929  Subjective: Susan Conway is a 88 y.o. year old female who is a primary care patient of [name], MD   An [facility] alert was received on 10/05/22 indicating patient responded to questions: Feeling worse overall?.   I reached out by phone to follow up on the alert and no response. Noted in chart patient resident at [facility] and should not be receiving post-discharge automated EMMI-Stroke calls.   Care Coordination Interventions Activated:  Yes  Care Coordination Interventions:  Yes, provided   Follow up plan: No further intervention required. RN CM will notify CMA to deactivate automated calls patient resident in SNF.  Encounter Outcome:  Pt. Visit Completed

## 2022-10-09 ENCOUNTER — Non-Acute Institutional Stay (SKILLED_NURSING_FACILITY): Payer: Medicare Other | Admitting: Internal Medicine

## 2022-10-09 ENCOUNTER — Encounter: Payer: Self-pay | Admitting: Internal Medicine

## 2022-10-09 DIAGNOSIS — I69322 Dysarthria following cerebral infarction: Secondary | ICD-10-CM | POA: Diagnosis not present

## 2022-10-09 DIAGNOSIS — I639 Cerebral infarction, unspecified: Secondary | ICD-10-CM | POA: Diagnosis not present

## 2022-10-09 DIAGNOSIS — R2689 Other abnormalities of gait and mobility: Secondary | ICD-10-CM | POA: Diagnosis not present

## 2022-10-09 DIAGNOSIS — I10 Essential (primary) hypertension: Secondary | ICD-10-CM | POA: Diagnosis not present

## 2022-10-09 DIAGNOSIS — N1831 Chronic kidney disease, stage 3a: Secondary | ICD-10-CM | POA: Diagnosis not present

## 2022-10-09 DIAGNOSIS — I158 Other secondary hypertension: Secondary | ICD-10-CM | POA: Diagnosis not present

## 2022-10-09 DIAGNOSIS — I6621 Occlusion and stenosis of right posterior cerebral artery: Secondary | ICD-10-CM | POA: Diagnosis not present

## 2022-10-09 DIAGNOSIS — I4821 Permanent atrial fibrillation: Secondary | ICD-10-CM

## 2022-10-09 DIAGNOSIS — R1312 Dysphagia, oropharyngeal phase: Secondary | ICD-10-CM

## 2022-10-09 DIAGNOSIS — D631 Anemia in chronic kidney disease: Secondary | ICD-10-CM

## 2022-10-09 DIAGNOSIS — E782 Mixed hyperlipidemia: Secondary | ICD-10-CM | POA: Diagnosis not present

## 2022-10-09 DIAGNOSIS — M62561 Muscle wasting and atrophy, not elsewhere classified, right lower leg: Secondary | ICD-10-CM | POA: Diagnosis not present

## 2022-10-09 DIAGNOSIS — R29818 Other symptoms and signs involving the nervous system: Secondary | ICD-10-CM | POA: Diagnosis not present

## 2022-10-09 DIAGNOSIS — R278 Other lack of coordination: Secondary | ICD-10-CM | POA: Diagnosis not present

## 2022-10-09 DIAGNOSIS — D649 Anemia, unspecified: Secondary | ICD-10-CM | POA: Diagnosis not present

## 2022-10-09 DIAGNOSIS — I6521 Occlusion and stenosis of right carotid artery: Secondary | ICD-10-CM | POA: Diagnosis not present

## 2022-10-09 DIAGNOSIS — I69354 Hemiplegia and hemiparesis following cerebral infarction affecting left non-dominant side: Secondary | ICD-10-CM | POA: Diagnosis not present

## 2022-10-09 DIAGNOSIS — M62562 Muscle wasting and atrophy, not elsewhere classified, left lower leg: Secondary | ICD-10-CM | POA: Diagnosis not present

## 2022-10-09 DIAGNOSIS — I69391 Dysphagia following cerebral infarction: Secondary | ICD-10-CM | POA: Diagnosis not present

## 2022-10-09 LAB — CBC AND DIFFERENTIAL
HCT: 29 — AB (ref 36–46)
Hemoglobin: 9.3 — AB (ref 12.0–16.0)
Neutrophils Absolute: 11.1
Platelets: 179 10*3/uL (ref 150–400)
WBC: 18.8

## 2022-10-09 LAB — CBC: RBC: 3.39 — AB (ref 3.87–5.11)

## 2022-10-09 NOTE — Telephone Encounter (Signed)
Calling back to let office know that they would prefer not to reschedule. They would just like to do Virtual if possible. Please advise

## 2022-10-09 NOTE — Progress Notes (Unsigned)
Provider:   Location:  [place] Room Number: 118A Place of Service:  SNF (31)  PCP: [name], MD Patient Care Team: [name], MD as PCP - General (Internal Medicine) [name], MD as PCP - Electrophysiology (Cardiology) [name], MD as Consulting Physician (Dermatology) [name], MD as Consulting Physician (Cardiology) [facility] [name], MD as Consulting Physician (Dermatology)  Extended Emergency Contact Information Primary Emergency Contact: [name] of [place] Phone: [phone] Mobile Phone: [phone] Relation: Daughter Secondary Emergency Contact: Terrilyn Saver of [place] Phone: [phone] Mobile Phone: [phone] Relation: Son  Code Status: DNR Goals of Care: Advanced Directive information    10/09/2022    9:52 AM  Advanced Directives  Does Patient Have a Medical Advance Directive? Yes  Type of Paramedic of [place];Out of facility DNR (pink MOST or yellow form);Living will  Does patient want to make changes to medical advance directive? No - Patient declined  Copy of [place] in Chart? Yes - validated most recent copy scanned in chart (See row information)      Chief Complaint  Patient presents with   New Admit To SNF    New admission to SNF   Immunizations    Discussed the need for covid , flu and TD vaccine   Quality Metric Gaps    Discussed the need for AWV    HPI: Patient is a 88 y.o. female seen today for admission to SNF  Lives in SNF in [place]  Admitted in the hospital from 11/21-11/27 for 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, likely embolic given afib not on 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 in 10/21 Has been Off Eliquis due to Bleeding risk   Was send to ED for acute onset of Dysarthria and Right Gaze with Left Facial Droop  CT scan showed Large Acute PCA infarct She got TNK  Eliquis not started On Aspirin only also on sttin now Dysphagia diet with Hiney liquids  BP better controlled now Responding and drinking fluids Right Gaze Dense Hemiparesis of left side Right Hand swelling and tender and somewhat rd Daughter says it is better C/o right Shoulder pain    Past Medical History:  Diagnosis Date   Actinic keratosis 05/25/2014   Anxiety    Atrial fibrillation ([place]) 09/21/2010   Basal cell carcinoma 05/25/2014   Multiple removed by Dr. Danella Sensing in March 2015: right neck, left neck, scalp    Bell's palsy 07/18/1979   Cervicalgia 01/31/2012   Closed fracture of lumbar vertebra without mention of spinal cord injury 07/17/2005   Conjunctiva disorder 12/26/2010   Coronary atherosclerosis of native coronary artery 07/17/1997   Cramp of limb 08/16/2009   Degeneration of lumbar or lumbosacral intervertebral disc 01/31/2012   Disturbance of skin sensation 08/2009   Dizziness and giddiness 11/08/2011   vertigo   External hemorrhoids without mention of complication 19/50/9326   External hemorrhoids without mention of complication 71/24/5809   Ganglion of tendon sheath 08/16/2009   Leg cramp 10/27/2013   Most frequently the right leg.    Long term (current) use of anticoagulants 09/2010   Lumbago 07/2009  Meralgia paresthetica 07/2007   MI, old    Myalgia and myositis, unspecified 01/17/2012   Osteoporosis    Other abnormal blood chemistry 04/08/2012   Other abnormal blood chemistry 2013   hyperglycemia   Other disorder of muscle, ligament, and fascia 04/08/2012   Other specified cardiac dysrhythmias(427.89) 06/27/2010   Pacemaker 05/06/2020   Pain in joint,  ankle and foot 10/27/2013   Bilateral since 1998    Pain in joint, shoulder region 08/12/2012   Pain in joint, upper arm 12/26/2010   Pain in limb 01/11/2011   Pain in thoracic spine 01/31/2012   Pathologic fracture of vertebrae 01/22/2012   PVC's (premature ventricular contractions)    Rash and other nonspecific skin eruption 08/02/2011   Senile osteoporosis 07/17/1993   Stroke ([place])    Unspecified essential hypertension 07/17/1997   Varicose veins of lower extremities 08/16/2009   Varicose veins of lower extremities 08/16/2009   Past Surgical History:  Procedure Laterality Date   COLONOSCOPY WITH PROPOFOL N/A 10/19/2015   Procedure: COLONOSCOPY WITH PROPOFOL;  Surgeon: Gatha Mayer, MD;  Location: [facility];  Service: Endoscopy;  Laterality: N/A;   CORONARY ARTERY BYPASS GRAFT  1998   x2; LIMA to LAD; SVG to diagonal off bypass   HEMORRHOID SURGERY  08/26/2012   Dr. Brantley Stage   LOOP RECORDER INSERTION N/A 05/13/2018   Procedure: LOOP RECORDER INSERTION;  Surgeon: [name], MD;  Location: [place] [facility];  Service: Cardiovascular;  Laterality: N/A;   LOOP RECORDER REMOVAL N/A 05/26/2019   Procedure: LOOP RECORDER REMOVAL;  Surgeon: [name], MD;  Location: [place] [facility];  Service: Cardiovascular;  Laterality: N/A;   MASS EXCISION Left 11/23/2015   Procedure: LEFT WRIST EXCISION CYST;  Surgeon: Leanora Cover, MD;  Location: [facility];  Service: Orthopedics;  Laterality: Left;   PACEMAKER IMPLANT N/A 05/26/2019   Procedure: PACEMAKER IMPLANT;  Surgeon: [name], MD;  Location: [place] [facility];  Service: Cardiovascular;  Laterality: N/A;   SKIN BIOPSY  01/29/14   (R) neck; (R) scalp, 2 (L) neck; shave biopsy Superficial basal cell carcinoma Dr. Danella Sensing   TONSILLECTOMY  [phone]    reports that she has never smoked. She has never used smokeless tobacco. She reports current alcohol use. She reports that she does not use drugs. Social  History   Socioeconomic History   Marital status: Widowed    Spouse name: Not on file   Number of children: Not on file   Years of education: Not on file   Highest education level: Not on file  Occupational History   Occupation: Retired Pharmacist, hospital  Tobacco Use   Smoking status: Never   Smokeless tobacco: Never  Vaping Use   Vaping Use: Never used  Substance and Sexual Activity   Alcohol use: Yes    Comment: Rarely - wine   Drug use: No   Sexual activity: Never  Other Topics Concern   Not on file  Social History Narrative   Living at [facility] since 2010   Widowed 09/03/2016   Never smoked   Alcohol occasional wine   Exercise, house work    DNR , POA, Living Will   Social Determinants of Health   Financial Resource Strain: [place]  (09/10/2018)   Overall Financial Resource Strain (CARDIA)    Difficulty of Paying Living Expenses: Not hard at all  Food Insecurity: No Food Insecurity (09/10/2018)   Hunger Vital Sign    Worried About  Running Out of Food in the Last Year: Never true    [place] in the Last Year: Never true  Transportation Needs: No Transportation Needs (09/10/2018)   PRAPARE - Hydrologist (Medical): No    Lack of Transportation (Non-Medical): No  Physical Activity: Insufficiently Active (09/10/2018)   Exercise Vital Sign    Days of Exercise per Week: 4 days    Minutes of Exercise per Session: 30 min  Stress: No Stress Concern Present (09/10/2018)   [place]    Feeling of Stress : Not at all  Social Connections: Somewhat Isolated (09/10/2018)   Social Connection and Isolation Panel [NHANES]    Frequency of Communication with Friends and Family: More than three times a week    Frequency of Social Gatherings with Friends and Family: More than three times a week    Attends Religious Services: More than 4 times per year    Active Member of [employer] or  Organizations: No    Attends Archivist Meetings: Never    Marital Status: Widowed  Intimate Partner Violence: Not At Risk (09/10/2018)   Humiliation, Afraid, Rape, and Kick questionnaire    Fear of Current or Ex-Partner: No    Emotionally Abused: No    Physically Abused: No    Sexually Abused: No    Functional Status Survey:    Family History  Problem Relation Age of Onset   Heart attack Father 108   Heart disease Father    Hypertension Sister    Heart disease Sister    Heart disease Brother     Health Maintenance  Topic Date Due   DTaP/Tdap/Td (2 - Tdap) 10/06/2017   INFLUENZA VACCINE  06/06/2022   COVID-19 Vaccine (4 - 2023-24 season) 07/07/2022   Medicare Annual Wellness (AWV)  09/27/2022   Pneumonia Vaccine 25+ Years old  Completed   DEXA SCAN  Completed   Zoster Vaccines- Shingrix  Completed   HPV VACCINES  Aged Out    Allergies  Allergen Reactions   Iodinated Contrast Media Nausea Only and Other (See Comments)    Severe nausea and also passed out   Iodine Nausea Only and Other (See Comments)    "Allergic," per [facility]- PASSED OUT   Lodine [Etodolac] Other (See Comments)    "Allergic," per [facility]   Other Other (See Comments)    Seasonal Allergies   Bactrim Rash   Relafen [Nabumetone] Rash   Sulfamethoxazole-Trimethoprim Rash    In matrix   Sulfanilamide Rash    Outpatient Encounter Medications as of 10/09/2022  Medication Sig   acetaminophen (TYLENOL) 325 MG tablet Take 650 mg by mouth 3 (three) times daily as needed (for pain).   amLODipine (NORVASC) 5 MG tablet Take 5 mg by mouth daily.   Artificial Saliva (BIOTENE DRY MOUTH MOISTURIZING) SOLN Use as directed 1-2 sprays in the mouth or throat 3 (three) times daily.   aspirin 81 MG chewable tablet Chew 1 tablet (81 mg total) by mouth daily.   atenolol (TENORMIN) 50 MG tablet Take 1 tablet (50 mg total) by mouth 2 (two) times daily.   atorvastatin (LIPITOR) 40 MG tablet Take 1 tablet (40 mg total) by  mouth daily.   Calcium Carb-Cholecalciferol (CALCIUM 500 + D3 PO) Take 1 capsule by mouth in the morning.   diclofenac Sodium (VOLTAREN) 1 % GEL Apply 2 g topically as needed (BID PRN). (Patient taking differently: Apply 2  g topically 4 (four) times daily as needed (BID PRN).)   digoxin (LANOXIN) 0.125 MG tablet Take 1 tablet (0.125 mg total) by mouth every other day.   docusate (COLACE) 50 MG/5ML liquid Take 100 mg by mouth daily.   hydrALAZINE (APRESOLINE) 10 MG tablet Take 10 mg by mouth 3 (three) times daily as needed (SBP>180).   hydrochlorothiazide (HYDRODIURIL) 12.5 MG tablet Take 1 tablet (12.5 mg total) by mouth daily.   levocetirizine (XYZAL) 5 MG tablet Take 5 mg by mouth at bedtime.   lidocaine (LIDODERM) 5 % Place 1 patch onto the skin as needed (for back pain- Remove & Discard patch within 12 hours or as directed by MD).   LORazepam (ATIVAN) 1 MG tablet Take 1 tablet (1 mg total) by mouth at bedtime.   losartan (COZAAR) 25 MG tablet Take 25 mg by mouth daily.   polyethylene glycol (MIRALAX / GLYCOLAX) 17 g packet Take 17 g by mouth See admin instructions. Mix 17 grams of powder into 4-6 oz of beverage of choice and drink once a day   potassium chloride SA (KLOR-CON) 20 MEQ tablet Take 10 mEq by mouth every evening.   [DISCONTINUED] saline (AYR) GEL Place 1 Application into both nostrils 2 (two) times daily.   No facility-administered encounter medications on file as of 10/09/2022.    Review of Systems  Constitutional:  Positive for activity change. Negative for appetite change.  HENT: Negative.    Respiratory:  Negative for cough and shortness of breath.   Cardiovascular:  Negative for leg swelling.  Gastrointestinal:  Positive for constipation.  Genitourinary: Negative.   Musculoskeletal:  Positive for gait problem. Negative for arthralgias and myalgias.  Skin: Negative.   Neurological:  Positive for speech difficulty and weakness. Negative for dizziness.   Psychiatric/Behavioral:  Positive for confusion. Negative for dysphoric mood and sleep disturbance.     Vitals:   10/09/22 0941  BP: (!) 155/83  Pulse: 82  Resp: 17  Temp: (!) 96 F (35.6 C)  TempSrc: Temporal  Weight: 130 lb (59 kg)  Height: '5\' 6"'$  (1.676 m)   Body mass index is 20.98 kg/m. Physical Exam Vitals reviewed.  Constitutional:      Appearance: Normal appearance.  HENT:     Head: Normocephalic.     Nose: Nose normal.     Mouth/Throat:     Mouth: Mucous membranes are moist.     Pharynx: Oropharynx is clear.  Eyes:     Pupils: Pupils are equal, round, and reactive to light.  Cardiovascular:     Rate and Rhythm: Normal rate. Rhythm irregular.     Pulses: Normal pulses.     Heart sounds: Normal heart sounds. No murmur heard. Pulmonary:     Effort: Pulmonary effort is normal.     Breath sounds: Normal breath sounds.  Abdominal:     General: Abdomen is flat. Bowel sounds are normal.     Palpations: Abdomen is soft.  Musculoskeletal:        General: No swelling.     Cervical back: Neck supple.  Skin:    General: Skin is warm.  Neurological:     Mental Status: She is alert.     Comments: Left dense Hemiparesis 0/5 bith Upper and Lower extremities Right gaze Speech slurred but clear  Psychiatric:        Mood and Affect: Mood normal.        Thought Content: Thought content normal.     Labs reviewed: Basic  Metabolic Panel: Recent Labs    09/30/22 0931 10/01/22 0806 10/02/22 0900  NA 141 142 142  K 3.6 3.3* 3.9  CL 108 111 116*  CO2 22 20* 19*  GLUCOSE 107* 114* 108*  BUN 34* 36* 35*  CREATININE 1.25* 1.21* 1.09*  CALCIUM 8.5* 8.2* 8.2*   Liver Function Tests: Recent Labs    08/01/22 0000 09/26/22 1539  AST 18 17  ALT 8 12  ALKPHOS 86 71  BILITOT  --  0.3  PROT  --  7.1  ALBUMIN 3.8 3.1*   No results for input(s): "LIPASE", "AMYLASE" in the last 8760 hours. No results for input(s): "AMMONIA" in the last 8760 hours. CBC: Recent Labs     09/26/22 1539 09/29/22 0513 09/30/22 0931 10/01/22 0806 10/02/22 0900  WBC 8.2   < > 10.7* 11.0* 9.9  NEUTROABS 4.2  --   --   --   --   HGB 10.7*   < > 9.2* 8.5* 8.8*  HCT 33.5*   < > 28.9* 25.7* 27.2*  MCV 87.7   < > 88.1 87.1 86.6  PLT 151   < > 96* 87* 109*   < > = values in this interval not displayed.   Cardiac Enzymes: No results for input(s): "CKTOTAL", "CKMB", "CKMBINDEX", "TROPONINI" in the last 8760 hours. BNP: Invalid input(s): "POCBNP" Lab Results  Component Value Date   HGBA1C 5.7 (H) 09/26/2022   Lab Results  Component Value Date   TSH 2.87 02/03/2022   Lab Results  Component Value Date   VITAMINB12 1,343 02/03/2022   Lab Results  Component Value Date   FOLATE 8.5 02/03/2022   No results found for: "IRON", "TIBC", "FERRITIN"  Imaging and Procedures obtained prior to SNF admission: ECHOCARDIOGRAM COMPLETE  Result Date: 09/27/2022    ECHOCARDIOGRAM REPORT   Patient Name:   JONAYA FRESHOUR Date of Exam: 09/27/2022 Medical Rec #:  [id number]   Height:       66.0 in Accession #:    [id number]  Weight:       131.4 lb Date of Birth:  Oct 31, 1929   BSA:          1.673 m Patient Age:    52 years    BP:           167/82 mmHg Patient Gender: F           HR:           68 bpm. Exam Location:  Inpatient Procedure: 2D Echo Indications:    stroke  History:        Patient has prior history of Echocardiogram examinations, most                 recent 08/20/2020. CAD, Pacemaker, Arrythmias:PVC and Atrial                 Fibrillation; Risk Factors:Hypertension and Dyslipidemia.  Sonographer:    Johny Chess RDCS Referring Phys: [phone] MCNEILL P KIRKPATRICK  Sonographer Comments: Image acquisition challenging due to uncooperative patient. IMPRESSIONS  1. Left ventricular ejection fraction, by estimation, is 60 to 65%. The left ventricle has normal function. The left ventricle has no regional wall motion abnormalities. Left ventricular diastolic parameters are indeterminate.  2.  Right ventricular systolic function is normal. The right ventricular size is normal. There is normal pulmonary artery systolic pressure.  3. Left atrial size was moderately dilated.  4. Right atrial size was severely dilated.  5. The mitral valve is  degenerative. Trivial mitral valve regurgitation. No evidence of mitral stenosis.  6. Tricuspid valve regurgitation is moderate to severe.  7. The aortic valve is tricuspid. There is severe calcifcation of the aortic valve. Aortic valve regurgitation is not visualized. Aortic valve sclerosis/calcification is present, without any evidence of aortic stenosis. Comparison(s): No significant change from prior study. FINDINGS  Left Ventricle: Left ventricular ejection fraction, by estimation, is 60 to 65%. The left ventricle has normal function. The left ventricle has no regional wall motion abnormalities. The left ventricular internal cavity size was normal in size. There is  no left ventricular hypertrophy. Left ventricular diastolic parameters are indeterminate. Right Ventricle: The right ventricular size is normal. No increase in right ventricular wall thickness. Right ventricular systolic function is normal. There is normal pulmonary artery systolic pressure. The tricuspid regurgitant velocity is 2.73 m/s, and  with an assumed right atrial pressure of 3 mmHg, the estimated right ventricular systolic pressure is 56.2 mmHg. Left Atrium: Left atrial size was moderately dilated. Right Atrium: Right atrial size was severely dilated. Pericardium: There is no evidence of pericardial effusion. Mitral Valve: The mitral valve is degenerative in appearance. Mild to moderate mitral annular calcification. Trivial mitral valve regurgitation. No evidence of mitral valve stenosis. Tricuspid Valve: The tricuspid valve is grossly normal. Tricuspid valve regurgitation is moderate to severe. Aortic Valve: The aortic valve is tricuspid. There is severe calcifcation of the aortic valve. Aortic  valve regurgitation is not visualized. Aortic valve sclerosis/calcification is present, without any evidence of aortic stenosis. Pulmonic Valve: The pulmonic valve was normal in structure. Pulmonic valve regurgitation is trivial. No evidence of pulmonic stenosis. Aorta: The aortic root is normal in size and structure. IAS/Shunts: No atrial level shunt detected by color flow Doppler. Additional Comments: A device lead is visualized in the right ventricle and right atrium.  LEFT VENTRICLE PLAX 2D LVIDd:         3.90 cm LVIDs:         2.20 cm LV PW:         1.00 cm LV IVS:        0.90 cm LVOT diam:     1.60 cm LV SV:         28 LV SV Index:   17 LVOT Area:     2.01 cm  RIGHT VENTRICLE            IVC RV Basal diam:  2.90 cm    IVC diam: 1.80 cm RV S prime:     8.27 cm/s TAPSE (M-mode): 1.0 cm LEFT ATRIUM             Index        RIGHT ATRIUM           Index LA diam:        4.40 cm 2.63 cm/m   RA Area:     25.40 cm LA Vol (A2C):   69.8 ml 41.73 ml/m  RA Volume:   80.20 ml  47.94 ml/m LA Vol (A4C):   61.8 ml 36.94 ml/m LA Biplane Vol: 65.7 ml 39.28 ml/m  AORTIC VALVE LVOT Vmax:   75.50 cm/s LVOT Vmean:  48.400 cm/s LVOT VTI:    0.139 m  AORTA Ao Root diam: 2.90 cm Ao Asc diam:  3.10 cm TRICUSPID VALVE TR Peak grad:   29.8 mmHg TR Vmax:        273.00 cm/s  SHUNTS Systemic VTI:  0.14 m Systemic Diam: 1.60 cm Rudean Haskell MD  Electronically signed by Rudean Haskell MD Signature Date/Time: 09/27/2022/4:34:47 PM    Final    CT HEAD WO CONTRAST  Result Date: 09/27/2022 CLINICAL DATA:  Stroke, follow up EXAM: CT HEAD WITHOUT CONTRAST TECHNIQUE: Contiguous axial images were obtained from the base of the skull through the vertex without intravenous contrast. RADIATION DOSE REDUCTION: This exam was performed according to the departmental dose-optimization program which includes automated exposure control, adjustment of the mA and/or kV according to patient size and/or use of iterative reconstruction  technique. COMPARISON:  November 21, 23. FINDINGS: Brain: Large acute right PCA territory infarct which involves the right thalamus held medial right temporal lobe, right hippocampus, and right occipital lobe. No evidence of acute hemorrhage, mass lesion or midline shift. No hydrocephalus. Vascular: Hyperdense right PCA. Skull: No acute fracture. Sinuses/Orbits: Bilateral sphenoid sinus opacification. Other: No acute orbital findings. IMPRESSION: 1. Large acute right PCA territory infarct. 2. Hyperdense right PCA, compatible with known occlusion. These results will be called to the ordering clinician or representative by the Radiologist Assistant, and communication documented in the PACS or [employer]. Electronically Signed   By: Margaretha Sheffield M.D.   On: 09/27/2022 13:53   CT ANGIO HEAD NECK W WO CM (CODE STROKE)  Result Date: 09/26/2022 CLINICAL DATA:  Code stroke EXAM: CT ANGIOGRAPHY HEAD AND NECK TECHNIQUE: Multidetector CT imaging of the head and neck was performed using the standard protocol during bolus administration of intravenous contrast. Multiplanar CT image reconstructions and MIPs were obtained to evaluate the vascular anatomy. Carotid stenosis measurements (when applicable) are obtained utilizing NASCET criteria, using the distal internal carotid diameter as the denominator. RADIATION DOSE REDUCTION: This exam was performed according to the departmental dose-optimization program which includes automated exposure control, adjustment of the mA and/or kV according to patient size and/or use of iterative reconstruction technique. CONTRAST:  1m OMNIPAQUE IOHEXOL 350 MG/ML SOLN COMPARISON:  Same-day noncontrast CT head, MR head and MRA head/neck 08/19/2020 FINDINGS: CTA NECK FINDINGS Aortic arch: There is calcified plaque in the imaged aortic arch. 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 The origins of the major branch vessels are patent with scattered soft and calcified plaque but no high-grade stenosis. Right carotid system: The right common carotid artery is patent but poorly opacified due to the downstream occlusion. The right internal carotid artery is occluded throughout its course. The external carotid artery is patent but poorly opacified. Left carotid system: The left common carotid artery is patent. There is calcified plaque in the proximal internal carotid artery resulting in up to approximately 50% stenosis. The distal internal carotid artery is patent but tortuous in the neck. The external carotid artery is patent. There is no dissection or aneurysm. Vertebral arteries: There is severe stenosis of the origin of the right vertebral artery and calcified plaque in the V1 segment resulting in mild stenosis. There is additional calcified plaque in the V3 segment resulting in mild-to-moderate stenosis. The left vertebral artery is patent without hemodynamically significant stenosis or occlusion in the neck. There is no evidence of dissection or aneurysm. Skeleton: There is multilevel degenerative change of the cervical spine. There is no acute osseous abnormality or suspicious osseous lesion. There is no visible canal hematoma. Other neck: The soft tissues are unremarkable. Upper chest: There is scarring in the lung  apices. Review of the MIP images confirms the above findings CTA HEAD FINDINGS Anterior circulation: The right ICA is occluded throughout its course with reconstitution of flow at the M1 segment. There is short-segment occlusion of a right M2 branch proximally (8-108, 7-91). There is additional focal severe stenosis of an inferior right M2 segment proximally (7-96). There is atherosclerotic irregularity and narrowing in the remainder of the right MCA branches. There is  calcified plaque in the left intracranial ICA resulting in up to mild-to-moderate stenosis of the supraclinoid segment. There is mild stenosis of the left M1 segment. The distal left MCA branches appear patent without proximal high-grade stenosis or occlusion. The bilateral ACAs are patent with focal severe stenosis of the left A2 segment (7-67). There is no other proximal high-grade stenosis or occlusion. There is no aneurysm or AVM. Posterior circulation: There is plaque in the right V4 segment resulting in severe stenosis proximally. There is critical stenosis/occlusion of the left vertebral artery at the V3/V4 junction with diminutive caliber of the remainder of the V4 segment. The basilar artery is patent with mild irregularity. The major cerebellar arteries appear patent. There is a fetal origin of the left PCA the left PCA appears patent, with mild-to-moderate stenosis of the proximal P2 segment (100-152). The right PCA is not identified proximally there is reconstitution of flow in the P2 segment (100-161). Note that the right PCA is also fetal in origin and primarily supplied by a now occluded posterior communicating artery. Venous sinuses: Suboptimally evaluated due to bolus timing. Anatomic variants: As above. Review of the MIP images confirms the above findings IMPRESSION: 1. 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 2. Short-segment occlusion of a right M2 branch proximally and additional focal severe stenosis of an inferior right M2 segment proximally. 3. Occlusion of the right PCA proximally with reconstitution of flow in the P2 segment. Note that the right PCA is also fetal in origin and primarily supplied by a now occluded posterior communicating artery. 4. Additional intracranial atherosclerotic disease resulting in severe stenosis of the left A2 segment, severe stenosis of  the right V4 segment, and critical stenosis/occlusion of the left vertebral artery at the V3/V4 junction with diminutive caliber of the remainder of the V4 segment. 5. Calcified plaque in the left carotid bifurcation resulting in approximately 50% stenosis. 6. Severe stenosis at the origin of the right vertebral artery. 7. 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 Recommend vascular surgery referral. Findings were communicated with Dr. Leonel Ramsay at 3:55 p.m. Electronically Signed   By: Valetta Mole M.D.   On: 09/26/2022 16:02   CT HEAD CODE STROKE WO CONTRAST  Addendum Date: 09/26/2022   ADDENDUM REPORT: 09/26/2022 15:57 ADDENDUM: Impression #2 should state chronic infract in the periventricular white matter/right corona radiata Electronically Signed   By: Marin Roberts M.D.   On: 09/26/2022 15:57   Result Date: 09/26/2022 CLINICAL DATA:  Code stroke. EXAM: CT HEAD WITHOUT CONTRAST TECHNIQUE: Contiguous axial images were obtained from the base of the skull through the vertex without intravenous contrast. RADIATION DOSE REDUCTION: This exam was performed according to the departmental dose-optimization program which includes automated exposure control, adjustment of the mA and/or kV according to patient size and/or use of iterative reconstruction technique. COMPARISON:  08/17/20 CT  brain, 08/19/20 MRI brain FINDINGS: Brain: No evidence of acute infarction, hemorrhage, hydrocephalus, extra-axial collection or mass lesion/mass effect. Redemonstrated generalized volume loss with sequela of moderate to severe chronic microvascular ischemic change. Redemonstrated area of acute to subacute infarct in the periventricular white matter/right corona radiata (series 100, image 26). Vascular: No hyperdense vessel or unexpected calcification. Skull: Normal. Negative for fracture or focal  lesion. Sinuses/Orbits: Bilateral lens replacements. Paranasal sinuses are clear. Other: None. ASPECTS ([place] Stroke Program Early CT Score): 10 IMPRESSION: 1. No hemorrhage or definitive CT evidence of a new infarct. 2. Redemonstrated area of acute to subacute infarct in the periventricular white matter/right corona radiata. Findings were paged to Dr. Leonel Ramsay on 09/26/22 at 3:31 PM. Electronically Signed: By: Marin Roberts M.D. On: 09/26/2022 15:31    Assessment/Plan 1. Neurologic deficit due to acute ischemic cerebrovascular accident (CVA) (HCC) Therapy Aspirin and Statin   2. Essential hypertension Cozar ,HCTZ and Amlodipine and Ateneolol  3. Oropharyngeal dysphagia D1 Diet Regular Honey thick  4. Permanent atrial fibrillation ([place]) D/W cardiology about DOCA Digoxina nd Toprol 5. Mixed hyperlipidemia [place] statin  6. Anemia due to stage 3a chronic kidney disease ([place]) Repeat CBC  Pending     Family/ staff Communication:   Labs/tests ordered:

## 2022-10-09 NOTE — Telephone Encounter (Signed)
Spoke to [place] with [facility] appointment with Nicholes Rough PA changed to a mychart video visit 12/5 at 2:20 pm.

## 2022-10-10 ENCOUNTER — Ambulatory Visit: Payer: Medicare Other | Admitting: Physician Assistant

## 2022-10-10 ENCOUNTER — Ambulatory Visit: Payer: Medicare Other | Attending: Physician Assistant | Admitting: Physician Assistant

## 2022-10-10 ENCOUNTER — Telehealth: Payer: Self-pay | Admitting: *Deleted

## 2022-10-10 ENCOUNTER — Encounter: Payer: Self-pay | Admitting: Physician Assistant

## 2022-10-10 VITALS — Ht 66.0 in | Wt 128.0 lb

## 2022-10-10 DIAGNOSIS — E785 Hyperlipidemia, unspecified: Secondary | ICD-10-CM

## 2022-10-10 DIAGNOSIS — I6621 Occlusion and stenosis of right posterior cerebral artery: Secondary | ICD-10-CM | POA: Diagnosis not present

## 2022-10-10 DIAGNOSIS — K922 Gastrointestinal hemorrhage, unspecified: Secondary | ICD-10-CM

## 2022-10-10 DIAGNOSIS — I4821 Permanent atrial fibrillation: Secondary | ICD-10-CM | POA: Diagnosis not present

## 2022-10-10 DIAGNOSIS — I69322 Dysarthria following cerebral infarction: Secondary | ICD-10-CM | POA: Diagnosis not present

## 2022-10-10 DIAGNOSIS — I69391 Dysphagia following cerebral infarction: Secondary | ICD-10-CM | POA: Diagnosis not present

## 2022-10-10 DIAGNOSIS — R2689 Other abnormalities of gait and mobility: Secondary | ICD-10-CM | POA: Diagnosis not present

## 2022-10-10 DIAGNOSIS — I10 Essential (primary) hypertension: Secondary | ICD-10-CM | POA: Diagnosis not present

## 2022-10-10 DIAGNOSIS — I69354 Hemiplegia and hemiparesis following cerebral infarction affecting left non-dominant side: Secondary | ICD-10-CM | POA: Diagnosis not present

## 2022-10-10 DIAGNOSIS — R278 Other lack of coordination: Secondary | ICD-10-CM | POA: Diagnosis not present

## 2022-10-10 DIAGNOSIS — M62562 Muscle wasting and atrophy, not elsewhere classified, left lower leg: Secondary | ICD-10-CM | POA: Diagnosis not present

## 2022-10-10 DIAGNOSIS — M62561 Muscle wasting and atrophy, not elsewhere classified, right lower leg: Secondary | ICD-10-CM | POA: Diagnosis not present

## 2022-10-10 DIAGNOSIS — R1312 Dysphagia, oropharyngeal phase: Secondary | ICD-10-CM | POA: Diagnosis not present

## 2022-10-10 DIAGNOSIS — I6521 Occlusion and stenosis of right carotid artery: Secondary | ICD-10-CM | POA: Diagnosis not present

## 2022-10-10 NOTE — Telephone Encounter (Signed)
  Patient Consent for Virtual Visit        Susan Conway has provided verbal consent on 10/10/2022 for a virtual visit (video or telephone).   CONSENT FOR VIRTUAL VISIT FOR:  Susan Conway  By participating in this virtual visit I agree to the following:  I hereby voluntarily request, consent and authorize [facility] and its employed or contracted physicians, physician assistants, nurse practitioners or other licensed health care professionals (the Practitioner), to provide me with telemedicine health care services (the "Services") as deemed necessary by the treating Practitioner. I acknowledge and consent to receive the Services by the Practitioner via telemedicine. I understand that the telemedicine visit will involve communicating with the Practitioner through live audiovisual communication technology and the disclosure of certain medical information by electronic transmission. I acknowledge that I have been given the opportunity to request an in-person assessment or other available alternative prior to the telemedicine visit and am voluntarily participating in the telemedicine visit.  I understand that I have the right to withhold or withdraw my consent to the use of telemedicine in the course of my care at any time, without affecting my right to future care or treatment, and that the Practitioner or I may terminate the telemedicine visit at any time. I understand that I have the right to inspect all information obtained and/or recorded in the course of the telemedicine visit and may receive copies of available information for a reasonable fee.  I understand that some of the potential risks of receiving the Services via telemedicine include:  Delay or interruption in medical evaluation due to technological equipment failure or disruption; Information transmitted may not be sufficient (e.g. poor resolution of images) to allow for appropriate medical decision making by the Practitioner;  and/or  In rare instances, security protocols could fail, causing a breach of personal health information.  Furthermore, I acknowledge that it is my responsibility to provide information about my medical history, conditions and care that is complete and accurate to the best of my ability. I acknowledge that Practitioner's advice, recommendations, and/or decision may be based on factors not within their control, such as incomplete or inaccurate data provided by me or distortions of diagnostic images or specimens that may result from electronic transmissions. I understand that the practice of medicine is not an exact science and that Practitioner makes no warranties or guarantees regarding treatment outcomes. I acknowledge that a copy of this consent can be made available to me via my patient portal ([facility]), or I can request a printed copy by calling the office of [facility].    I understand that my insurance will be billed for this visit.   I have read or had this consent read to me. I understand the contents of this consent, which adequately explains the benefits and risks of the Services being provided via telemedicine.  I have been provided ample opportunity to ask questions regarding this consent and the Services and have had my questions answered to my satisfaction. I give my informed consent for the services to be provided through the use of telemedicine in my medical care

## 2022-10-10 NOTE — Progress Notes (Signed)
Virtual Visit via Video Note   Because of Susan Conway's co-morbid illnesses, she is at least at moderate risk for complications without adequate follow up.  This format is felt to be most appropriate for this patient at this time.  All issues noted in this document were discussed and addressed.  A limited physical exam was performed with this format.  Please refer to the patient's chart for her consent to telehealth for [facility].       Date:  10/10/2022   ID:  Susan Conway, DOB 11/19/1928, MRN [id number] The patient was identified using 2 identifiers.  Patient Location: Home Provider Location: Office/Clinic   PCP:  [name], [place] Providers Cardiologist:  None Electrophysiologist:  Cristopher Peru, MD {  Evaluation Performed:  Follow-Up Visit  Chief Complaint:  recent hospitalization and stroke  History of Present Illness:    Susan Conway is a 88 y.o. female with a PMH of permanent atrial fibrillation, long pauses, hypertension, history of stroke.  She was seen last by Dr. Lovena Le 10/2021.  She had been more sedentary at that time and did not walk without assistance.  She had blood into a Baker's cyst 2 years prior on Eliquis 2.5 mg twice daily.  She also has a history of hemorrhoidal bleeding.  She was recently in the hospital 11/21 through 10/02/2022.  At that time she was diagnosed with a large right PCA infarct status post TNK, likely embolic giving A-fib and not on AC.  She presented with dysarthria, left facial droop and right gaze preference.  She was from [facility].  She could not tolerate MRI so a repeat CT was ordered which showed a large stroke.  It was decided to continue her ASA at this time and not start her on any anticoagulation.  Today, I spoke with her daughter Susan Conway and Susan Conway was listening on the line.  We had difficulty getting a picture to work on her and but they were able to see me.  We discussed her most recent  hospitalization as well as anticoagulation recommendations.  Due to the extensive history of bleeding, I recommended that we hold off on traditional anticoagulation at this time and her antiplatelet with aspirin 81 mg daily.  I told her daughter that we would discuss this as well with Dr. Lovena Le.  She wanted to discuss lipid-lowering foods versus an increase in medication.  We went through several foods that are good for lowering cholesterol level.  Her LDL remains above goal at 114.  Last couple of blood pressures were provided.  It sounds like the more recent blood pressures were better controlled than over the weekend.  If blood pressure is consistently in the 170s, would let our office know.  Reports no shortness of breath nor dyspnea on exertion. Reports no chest pain, pressure, or tightness. No edema, orthopnea, PND. Reports no palpitations.   Past Medical History:  Diagnosis Date   Actinic keratosis 05/25/2014   Anxiety    Atrial fibrillation ([place]) 09/21/2010   Basal cell carcinoma 05/25/2014   Multiple removed by Dr. Danella Sensing in March 2015: right neck, left neck, scalp    Bell's palsy 07/18/1979   Cervicalgia 01/31/2012   Closed fracture of lumbar vertebra without mention of spinal cord injury 07/17/2005   Conjunctiva disorder 12/26/2010   Coronary atherosclerosis of native coronary artery 07/17/1997   Cramp of limb 08/16/2009   Degeneration of lumbar or lumbosacral intervertebral  disc 01/31/2012   Disturbance of skin sensation 08/2009   Dizziness and giddiness 11/08/2011   vertigo   External hemorrhoids without mention of complication 22/12/5425   External hemorrhoids without mention of complication 04/29/7627   Ganglion of tendon sheath 08/16/2009   Leg cramp 10/27/2013   Most frequently the right leg.    Long term (current) use of anticoagulants 09/2010   Lumbago 07/2009   Meralgia paresthetica 07/2007   MI, old    Myalgia and myositis, unspecified 01/17/2012    Osteoporosis    Other abnormal blood chemistry 04/08/2012   Other abnormal blood chemistry 2013   hyperglycemia   Other disorder of muscle, ligament, and fascia 04/08/2012   Other specified cardiac dysrhythmias(427.89) 06/27/2010   Pacemaker 05/06/2020   Pain in joint, ankle and foot 10/27/2013   Bilateral since 1998    Pain in joint, shoulder region 08/12/2012   Pain in joint, upper arm 12/26/2010   Pain in limb 01/11/2011   Pain in thoracic spine 01/31/2012   Pathologic fracture of vertebrae 01/22/2012   PVC's (premature ventricular contractions)    Rash and other nonspecific skin eruption 08/02/2011   Senile osteoporosis 07/17/1993   Stroke ([place])    Unspecified essential hypertension 07/17/1997   Varicose veins of lower extremities 08/16/2009   Varicose veins of lower extremities 08/16/2009   Past Surgical History:  Procedure Laterality Date   COLONOSCOPY WITH PROPOFOL N/A 10/19/2015   Procedure: COLONOSCOPY WITH PROPOFOL;  Surgeon: Gatha Mayer, MD;  Location: [facility];  Service: Endoscopy;  Laterality: N/A;   CORONARY ARTERY BYPASS GRAFT  1998   x2; LIMA to LAD; SVG to diagonal off bypass   HEMORRHOID SURGERY  08/26/2012   Dr. Brantley Stage   LOOP RECORDER INSERTION N/A 05/13/2018   Procedure: LOOP RECORDER INSERTION;  Surgeon: [name], MD;  Location: [place] [facility];  Service: Cardiovascular;  Laterality: N/A;   LOOP RECORDER REMOVAL N/A 05/26/2019   Procedure: LOOP RECORDER REMOVAL;  Surgeon: [name], MD;  Location: [place] [facility];  Service: Cardiovascular;  Laterality: N/A;   MASS EXCISION Left 11/23/2015   Procedure: LEFT WRIST EXCISION CYST;  Surgeon: Leanora Cover, MD;  Location: [facility];  Service: Orthopedics;  Laterality: Left;   PACEMAKER IMPLANT N/A 05/26/2019   Procedure: PACEMAKER IMPLANT;  Surgeon: [name], MD;  Location: [place] [facility];  Service: Cardiovascular;  Laterality: N/A;   SKIN BIOPSY  01/29/14   (R) neck;  (R) scalp, 2 (L) neck; shave biopsy Superficial basal cell carcinoma Dr. Danella Sensing   TONSILLECTOMY  [phone]     Current Meds  Medication Sig   acetaminophen (TYLENOL) 325 MG tablet Take 650 mg by mouth 3 (three) times daily as needed (for pain).   amLODipine (NORVASC) 5 MG tablet Take 5 mg by mouth daily.   Artificial Saliva (BIOTENE DRY MOUTH MOISTURIZING) SOLN Use as directed 1-2 sprays in the mouth or throat 3 (three) times daily.   aspirin 81 MG chewable tablet Chew 1 tablet (81 mg total) by mouth daily.   atenolol (TENORMIN) 50 MG tablet Take 1 tablet (50 mg total) by mouth 2 (two) times daily.   atorvastatin (LIPITOR) 40 MG tablet Take 1 tablet (40 mg total) by mouth daily.   Calcium Carb-Cholecalciferol (CALCIUM 500 + D3 PO) Take 1 capsule by mouth in the morning.   diclofenac Sodium (VOLTAREN) 1 % GEL Apply 2 g topically as needed (BID PRN).   digoxin (LANOXIN) 0.125  MG tablet Take 1 tablet (0.125 mg total) by mouth every other day.   docusate (COLACE) 50 MG/5ML liquid Take 100 mg by mouth daily.   doxycycline (ADOXA) 100 MG tablet Take 100 mg by mouth 2 (two) times daily.   hydrALAZINE (APRESOLINE) 10 MG tablet Take 10 mg by mouth 3 (three) times daily as needed (SBP>180).   hydrochlorothiazide (HYDRODIURIL) 12.5 MG tablet Take 1 tablet (12.5 mg total) by mouth daily.   levocetirizine (XYZAL) 5 MG tablet Take 5 mg by mouth at bedtime.   lidocaine (LIDODERM) 5 % Place 1 patch onto the skin as needed (for back pain- Remove & Discard patch within 12 hours or as directed by MD).   LORazepam (ATIVAN) 1 MG tablet Take 1 tablet (1 mg total) by mouth at bedtime.   losartan (COZAAR) 25 MG tablet Take 25 mg by mouth daily.   polyethylene glycol (MIRALAX / GLYCOLAX) 17 g packet Take 17 g by mouth See admin instructions. Mix 17 grams of powder into 4-6 oz of beverage of choice and drink once a day   potassium chloride SA (KLOR-CON) 20 MEQ tablet Take 10 mEq by mouth every evening.      Allergies:   Iodinated contrast media, Iodine, Lodine [etodolac], Other, Bactrim, Relafen [nabumetone], Sulfamethoxazole-trimethoprim, and Sulfanilamide   Social History   Tobacco Use   Smoking status: Never   Smokeless tobacco: Never  Vaping Use   Vaping Use: Never used  Substance Use Topics   Alcohol use: Yes    Comment: Rarely - wine   Drug use: No     Family Hx: The patient's family history includes Heart attack (age of onset: 14) in her father; Heart disease in her brother, father, and sister; Hypertension in her sister.  ROS:   Please see the history of present illness.     All other systems reviewed and are negative.   Prior CV studies:   The following studies were reviewed today:  Echocardiogram 09/27/2022  IMPRESSIONS     1. Left ventricular ejection fraction, by estimation, is 60 to 65%. The  left ventricle has normal function. The left ventricle has no regional  wall motion abnormalities. Left ventricular diastolic parameters are  indeterminate.   2. Right ventricular systolic function is normal. The right ventricular  size is normal. There is normal pulmonary artery systolic pressure.   3. Left atrial size was moderately dilated.   4. Right atrial size was severely dilated.   5. The mitral valve is degenerative. Trivial mitral valve regurgitation.  No evidence of mitral stenosis.   6. Tricuspid valve regurgitation is moderate to severe.   7. The aortic valve is tricuspid. There is severe calcifcation of the  aortic valve. Aortic valve regurgitation is not visualized. Aortic valve  sclerosis/calcification is present, without any evidence of aortic  stenosis.   Comparison(s): No significant change from prior study.   FINDINGS   Left Ventricle: Left ventricular ejection fraction, by estimation, is 60  to 65%. The left ventricle has normal function. The left ventricle has no  regional wall motion abnormalities. The left ventricular internal cavity   size was normal in size. There is   no left ventricular hypertrophy. Left ventricular diastolic parameters  are indeterminate.   Right Ventricle: The right ventricular size is normal. No increase in  right ventricular wall thickness. Right ventricular systolic function is  normal. There is normal pulmonary artery systolic pressure. The tricuspid  regurgitant velocity is 2.73 m/s, and   with  an assumed right atrial pressure of 3 mmHg, the estimated right  ventricular systolic pressure is 67.5 mmHg.   Left Atrium: Left atrial size was moderately dilated.   Right Atrium: Right atrial size was severely dilated.   Pericardium: There is no evidence of pericardial effusion.   Mitral Valve: The mitral valve is degenerative in appearance. Mild to  moderate mitral annular calcification. Trivial mitral valve regurgitation.  No evidence of mitral valve stenosis.   Tricuspid Valve: The tricuspid valve is grossly normal. Tricuspid valve  regurgitation is moderate to severe.   Aortic Valve: The aortic valve is tricuspid. There is severe calcifcation  of the aortic valve. Aortic valve regurgitation is not visualized. Aortic  valve sclerosis/calcification is present, without any evidence of aortic  stenosis.   Pulmonic Valve: The pulmonic valve was normal in structure. Pulmonic valve  regurgitation is trivial. No evidence of pulmonic stenosis.   Aorta: The aortic root is normal in size and structure.   IAS/Shunts: No atrial level shunt detected by color flow Doppler.   Labs/Other Tests and Data Reviewed:    EKG:  An ECG dated 09/26/22 was personally reviewed today and demonstrated:  rate controlled Afib, rate 71 bpm, RBBB  (old)  Recent Labs: 02/03/2022: TSH 2.87 09/26/2022: ALT 12 10/02/2022: BUN 35; Creatinine, Ser 1.09; Hemoglobin 8.8; Platelets 109; Potassium 3.9; Sodium 142   Recent Lipid Panel Lab Results  Component Value Date/Time   CHOL 161 09/27/2022 05:46 AM   TRIG 39  09/27/2022 05:46 AM   HDL 39 (L) 09/27/2022 05:46 AM   CHOLHDL 4.1 09/27/2022 05:46 AM   LDLCALC 114 (H) 09/27/2022 05:46 AM   LDLDIRECT 81.3 08/19/2020 06:25 PM    Wt Readings from Last 3 Encounters:  10/10/22 128 lb (58.1 kg)  10/09/22 130 lb (59 kg)  10/06/22 130 lb 12.8 oz (59.3 kg)     Risk Assessment/Calculations:    CHA2DS2-VASc Score = 7  This indicates a 11.2% annual risk of stroke. The patient's score is based upon: CHF History: 0 HTN History: 1 Diabetes History: 0 Stroke History: 2 Vascular Disease History: 1 Age Score: 2 Gender Score: 1       Objective:    Vital Signs:  Ht '5\' 6"'$  (1.676 m)   Wt 128 lb (58.1 kg)   BMI 20.66 kg/m    6pm Monday 149/76 Sunday 169/81 Dec 2nd 9:30 171/84   ASSESSMENT & PLAN:    CVA -off ativan now -Participating in physical therapy, Occupational Therapy, and speech therapy  [place] Atrial fibrillation -rate controlled, EKG reviewed -asymptomatic at this time -continue asa '81mg'$ , AC deferred due to hx of bleeding -not a candidate for watchman  History of bleeding -Eliquis issues with bleeding -Coumadin also had bleeding  -h/o hemorrhoidal bleeding -CHA2DS2-VASc score of 7   Hypertension -was high over the weekend but now 916B systolic -continue to monitor closely -continue current medications: Amlodipine 5 mg daily, atenolol 50 mg twice daily, hydralazine 10 mg 3 times daily, HCTZ 12.5 mg daily, losartan 25 mg daily  Hyperlipidemia LDL < 70, 114  -lipid lowering food reviewed -would like to hold off on titration of statin for now           Time:   Today, I have spent 25 minutes with the patient with telehealth technology discussing the above problems.     Medication Adjustments/Labs and Tests Ordered: Current medicines are reviewed at length with the patient today.  Concerns regarding medicines are outlined above.   Tests  Ordered: No orders of the defined types were placed in this  encounter.   Medication Changes: No orders of the defined types were placed in this encounter.   Follow Up:  In Person  2-3 months  Signed, Elgie Collard, Hershal Coria  10/10/2022 4:28 PM    [place]

## 2022-10-11 DIAGNOSIS — I69322 Dysarthria following cerebral infarction: Secondary | ICD-10-CM | POA: Diagnosis not present

## 2022-10-11 DIAGNOSIS — R278 Other lack of coordination: Secondary | ICD-10-CM | POA: Diagnosis not present

## 2022-10-11 DIAGNOSIS — R2689 Other abnormalities of gait and mobility: Secondary | ICD-10-CM | POA: Diagnosis not present

## 2022-10-11 DIAGNOSIS — I69391 Dysphagia following cerebral infarction: Secondary | ICD-10-CM | POA: Diagnosis not present

## 2022-10-11 DIAGNOSIS — I6621 Occlusion and stenosis of right posterior cerebral artery: Secondary | ICD-10-CM | POA: Diagnosis not present

## 2022-10-11 DIAGNOSIS — R1312 Dysphagia, oropharyngeal phase: Secondary | ICD-10-CM | POA: Diagnosis not present

## 2022-10-11 DIAGNOSIS — M62561 Muscle wasting and atrophy, not elsewhere classified, right lower leg: Secondary | ICD-10-CM | POA: Diagnosis not present

## 2022-10-11 DIAGNOSIS — M62562 Muscle wasting and atrophy, not elsewhere classified, left lower leg: Secondary | ICD-10-CM | POA: Diagnosis not present

## 2022-10-11 DIAGNOSIS — I69354 Hemiplegia and hemiparesis following cerebral infarction affecting left non-dominant side: Secondary | ICD-10-CM | POA: Diagnosis not present

## 2022-10-11 DIAGNOSIS — I6521 Occlusion and stenosis of right carotid artery: Secondary | ICD-10-CM | POA: Diagnosis not present

## 2022-10-12 ENCOUNTER — Telehealth: Payer: Self-pay | Admitting: *Deleted

## 2022-10-12 DIAGNOSIS — M62561 Muscle wasting and atrophy, not elsewhere classified, right lower leg: Secondary | ICD-10-CM | POA: Diagnosis not present

## 2022-10-12 DIAGNOSIS — I69354 Hemiplegia and hemiparesis following cerebral infarction affecting left non-dominant side: Secondary | ICD-10-CM | POA: Diagnosis not present

## 2022-10-12 DIAGNOSIS — R278 Other lack of coordination: Secondary | ICD-10-CM | POA: Diagnosis not present

## 2022-10-12 DIAGNOSIS — I69322 Dysarthria following cerebral infarction: Secondary | ICD-10-CM | POA: Diagnosis not present

## 2022-10-12 DIAGNOSIS — R1312 Dysphagia, oropharyngeal phase: Secondary | ICD-10-CM | POA: Diagnosis not present

## 2022-10-12 DIAGNOSIS — I69391 Dysphagia following cerebral infarction: Secondary | ICD-10-CM | POA: Diagnosis not present

## 2022-10-12 DIAGNOSIS — I6621 Occlusion and stenosis of right posterior cerebral artery: Secondary | ICD-10-CM | POA: Diagnosis not present

## 2022-10-12 DIAGNOSIS — I6521 Occlusion and stenosis of right carotid artery: Secondary | ICD-10-CM | POA: Diagnosis not present

## 2022-10-12 DIAGNOSIS — R2689 Other abnormalities of gait and mobility: Secondary | ICD-10-CM | POA: Diagnosis not present

## 2022-10-12 DIAGNOSIS — M62562 Muscle wasting and atrophy, not elsewhere classified, left lower leg: Secondary | ICD-10-CM | POA: Diagnosis not present

## 2022-10-12 DIAGNOSIS — I158 Other secondary hypertension: Secondary | ICD-10-CM | POA: Diagnosis not present

## 2022-10-12 LAB — CBC AND DIFFERENTIAL
HCT: 30 — AB (ref 36–46)
Hemoglobin: 9.7 — AB (ref 12.0–16.0)
Neutrophils Absolute: 11.6
Platelets: 210 10*3/uL (ref 150–400)
WBC: 17.7

## 2022-10-12 LAB — CBC: RBC: 3.55 — AB (ref 3.87–5.11)

## 2022-10-12 NOTE — Telephone Encounter (Signed)
-----   Message from Elgie Collard, [place] sent at 10/11/2022  8:00 PM EST ----- Could we reach out to this patient tomorrow and let her know Dr. Lovena Le agrees to hold off on [facility] for now given the patient's history.   Thanks! Elgie Collard, PA-C  ----- Message ----- From: [name], MD Sent: 10/10/2022   9:48 PM EST To: Elgie Collard, PA-C  agree ----- Message ----- From: Miguel Aschoff Sent: 10/10/2022   4:28 PM EST To: [name], MD  Just wanted to make sure you agree with holding off on [facility] for now. I reviewed the risks with the daughter. Thanks!

## 2022-10-12 NOTE — Telephone Encounter (Signed)
Attempted to reach patient but was unsuccessful.

## 2022-10-12 NOTE — Progress Notes (Signed)
Attempted to reach patient but was unsuccessful.

## 2022-10-13 DIAGNOSIS — R2689 Other abnormalities of gait and mobility: Secondary | ICD-10-CM | POA: Diagnosis not present

## 2022-10-13 DIAGNOSIS — R1312 Dysphagia, oropharyngeal phase: Secondary | ICD-10-CM | POA: Diagnosis not present

## 2022-10-13 DIAGNOSIS — I69322 Dysarthria following cerebral infarction: Secondary | ICD-10-CM | POA: Diagnosis not present

## 2022-10-13 DIAGNOSIS — M62562 Muscle wasting and atrophy, not elsewhere classified, left lower leg: Secondary | ICD-10-CM | POA: Diagnosis not present

## 2022-10-13 DIAGNOSIS — I6521 Occlusion and stenosis of right carotid artery: Secondary | ICD-10-CM | POA: Diagnosis not present

## 2022-10-13 DIAGNOSIS — I69391 Dysphagia following cerebral infarction: Secondary | ICD-10-CM | POA: Diagnosis not present

## 2022-10-13 DIAGNOSIS — R278 Other lack of coordination: Secondary | ICD-10-CM | POA: Diagnosis not present

## 2022-10-13 DIAGNOSIS — M62561 Muscle wasting and atrophy, not elsewhere classified, right lower leg: Secondary | ICD-10-CM | POA: Diagnosis not present

## 2022-10-13 DIAGNOSIS — I69354 Hemiplegia and hemiparesis following cerebral infarction affecting left non-dominant side: Secondary | ICD-10-CM | POA: Diagnosis not present

## 2022-10-13 DIAGNOSIS — I6621 Occlusion and stenosis of right posterior cerebral artery: Secondary | ICD-10-CM | POA: Diagnosis not present

## 2022-10-13 NOTE — Telephone Encounter (Signed)
Spoke with patients daughter, [name] (okay per dpr) and gave her msg below from Dr Lovena Le and Johann Capers regarding holding off on [facility] for now. [name] states her understanding and appreciation for the call.

## 2022-10-16 DIAGNOSIS — D649 Anemia, unspecified: Secondary | ICD-10-CM | POA: Diagnosis not present

## 2022-10-16 DIAGNOSIS — I69391 Dysphagia following cerebral infarction: Secondary | ICD-10-CM | POA: Diagnosis not present

## 2022-10-16 DIAGNOSIS — I69322 Dysarthria following cerebral infarction: Secondary | ICD-10-CM | POA: Diagnosis not present

## 2022-10-16 DIAGNOSIS — I6521 Occlusion and stenosis of right carotid artery: Secondary | ICD-10-CM | POA: Diagnosis not present

## 2022-10-16 DIAGNOSIS — R278 Other lack of coordination: Secondary | ICD-10-CM | POA: Diagnosis not present

## 2022-10-16 DIAGNOSIS — R2689 Other abnormalities of gait and mobility: Secondary | ICD-10-CM | POA: Diagnosis not present

## 2022-10-16 DIAGNOSIS — I6621 Occlusion and stenosis of right posterior cerebral artery: Secondary | ICD-10-CM | POA: Diagnosis not present

## 2022-10-16 DIAGNOSIS — I69354 Hemiplegia and hemiparesis following cerebral infarction affecting left non-dominant side: Secondary | ICD-10-CM | POA: Diagnosis not present

## 2022-10-16 DIAGNOSIS — M62561 Muscle wasting and atrophy, not elsewhere classified, right lower leg: Secondary | ICD-10-CM | POA: Diagnosis not present

## 2022-10-16 DIAGNOSIS — R1312 Dysphagia, oropharyngeal phase: Secondary | ICD-10-CM | POA: Diagnosis not present

## 2022-10-16 DIAGNOSIS — M62562 Muscle wasting and atrophy, not elsewhere classified, left lower leg: Secondary | ICD-10-CM | POA: Diagnosis not present

## 2022-10-16 LAB — CBC AND DIFFERENTIAL
HCT: 27 — AB (ref 36–46)
Hemoglobin: 8.6 — AB (ref 12.0–16.0)
Neutrophils Absolute: 9.4
Platelets: 217 10*3/uL (ref 150–400)
WBC: 15.1

## 2022-10-16 LAB — CBC: RBC: 3.15 — AB (ref 3.87–5.11)

## 2022-10-17 DIAGNOSIS — I69322 Dysarthria following cerebral infarction: Secondary | ICD-10-CM | POA: Diagnosis not present

## 2022-10-17 DIAGNOSIS — R2689 Other abnormalities of gait and mobility: Secondary | ICD-10-CM | POA: Diagnosis not present

## 2022-10-17 DIAGNOSIS — M62562 Muscle wasting and atrophy, not elsewhere classified, left lower leg: Secondary | ICD-10-CM | POA: Diagnosis not present

## 2022-10-17 DIAGNOSIS — R1312 Dysphagia, oropharyngeal phase: Secondary | ICD-10-CM | POA: Diagnosis not present

## 2022-10-17 DIAGNOSIS — I6621 Occlusion and stenosis of right posterior cerebral artery: Secondary | ICD-10-CM | POA: Diagnosis not present

## 2022-10-17 DIAGNOSIS — M62561 Muscle wasting and atrophy, not elsewhere classified, right lower leg: Secondary | ICD-10-CM | POA: Diagnosis not present

## 2022-10-17 DIAGNOSIS — I69391 Dysphagia following cerebral infarction: Secondary | ICD-10-CM | POA: Diagnosis not present

## 2022-10-17 DIAGNOSIS — R278 Other lack of coordination: Secondary | ICD-10-CM | POA: Diagnosis not present

## 2022-10-17 DIAGNOSIS — I69354 Hemiplegia and hemiparesis following cerebral infarction affecting left non-dominant side: Secondary | ICD-10-CM | POA: Diagnosis not present

## 2022-10-17 DIAGNOSIS — I6521 Occlusion and stenosis of right carotid artery: Secondary | ICD-10-CM | POA: Diagnosis not present

## 2022-10-18 DIAGNOSIS — R1312 Dysphagia, oropharyngeal phase: Secondary | ICD-10-CM | POA: Diagnosis not present

## 2022-10-18 DIAGNOSIS — M62561 Muscle wasting and atrophy, not elsewhere classified, right lower leg: Secondary | ICD-10-CM | POA: Diagnosis not present

## 2022-10-18 DIAGNOSIS — I69391 Dysphagia following cerebral infarction: Secondary | ICD-10-CM | POA: Diagnosis not present

## 2022-10-18 DIAGNOSIS — R2689 Other abnormalities of gait and mobility: Secondary | ICD-10-CM | POA: Diagnosis not present

## 2022-10-18 DIAGNOSIS — R278 Other lack of coordination: Secondary | ICD-10-CM | POA: Diagnosis not present

## 2022-10-18 DIAGNOSIS — I69354 Hemiplegia and hemiparesis following cerebral infarction affecting left non-dominant side: Secondary | ICD-10-CM | POA: Diagnosis not present

## 2022-10-18 DIAGNOSIS — M62562 Muscle wasting and atrophy, not elsewhere classified, left lower leg: Secondary | ICD-10-CM | POA: Diagnosis not present

## 2022-10-18 DIAGNOSIS — I69322 Dysarthria following cerebral infarction: Secondary | ICD-10-CM | POA: Diagnosis not present

## 2022-10-18 DIAGNOSIS — I6621 Occlusion and stenosis of right posterior cerebral artery: Secondary | ICD-10-CM | POA: Diagnosis not present

## 2022-10-18 DIAGNOSIS — I6521 Occlusion and stenosis of right carotid artery: Secondary | ICD-10-CM | POA: Diagnosis not present

## 2022-10-19 ENCOUNTER — Non-Acute Institutional Stay (SKILLED_NURSING_FACILITY): Payer: Medicare Other | Admitting: Adult Health

## 2022-10-19 ENCOUNTER — Encounter: Payer: Self-pay | Admitting: Adult Health

## 2022-10-19 DIAGNOSIS — R2689 Other abnormalities of gait and mobility: Secondary | ICD-10-CM | POA: Diagnosis not present

## 2022-10-19 DIAGNOSIS — M62561 Muscle wasting and atrophy, not elsewhere classified, right lower leg: Secondary | ICD-10-CM | POA: Diagnosis not present

## 2022-10-19 DIAGNOSIS — I4821 Permanent atrial fibrillation: Secondary | ICD-10-CM | POA: Diagnosis not present

## 2022-10-19 DIAGNOSIS — R29818 Other symptoms and signs involving the nervous system: Secondary | ICD-10-CM

## 2022-10-19 DIAGNOSIS — I6621 Occlusion and stenosis of right posterior cerebral artery: Secondary | ICD-10-CM | POA: Diagnosis not present

## 2022-10-19 DIAGNOSIS — I69322 Dysarthria following cerebral infarction: Secondary | ICD-10-CM | POA: Diagnosis not present

## 2022-10-19 DIAGNOSIS — L03113 Cellulitis of right upper limb: Secondary | ICD-10-CM

## 2022-10-19 DIAGNOSIS — I69354 Hemiplegia and hemiparesis following cerebral infarction affecting left non-dominant side: Secondary | ICD-10-CM | POA: Diagnosis not present

## 2022-10-19 DIAGNOSIS — D692 Other nonthrombocytopenic purpura: Secondary | ICD-10-CM

## 2022-10-19 DIAGNOSIS — I69391 Dysphagia following cerebral infarction: Secondary | ICD-10-CM | POA: Diagnosis not present

## 2022-10-19 DIAGNOSIS — I10 Essential (primary) hypertension: Secondary | ICD-10-CM

## 2022-10-19 DIAGNOSIS — D72829 Elevated white blood cell count, unspecified: Secondary | ICD-10-CM

## 2022-10-19 DIAGNOSIS — R1312 Dysphagia, oropharyngeal phase: Secondary | ICD-10-CM | POA: Diagnosis not present

## 2022-10-19 DIAGNOSIS — I6521 Occlusion and stenosis of right carotid artery: Secondary | ICD-10-CM | POA: Diagnosis not present

## 2022-10-19 DIAGNOSIS — R5383 Other fatigue: Secondary | ICD-10-CM | POA: Diagnosis not present

## 2022-10-19 DIAGNOSIS — M62562 Muscle wasting and atrophy, not elsewhere classified, left lower leg: Secondary | ICD-10-CM | POA: Diagnosis not present

## 2022-10-19 DIAGNOSIS — R278 Other lack of coordination: Secondary | ICD-10-CM | POA: Diagnosis not present

## 2022-10-19 DIAGNOSIS — I639 Cerebral infarction, unspecified: Secondary | ICD-10-CM | POA: Diagnosis not present

## 2022-10-19 NOTE — Progress Notes (Addendum)
Location:  Occupational psychologist of Service:  SNF (31) Provider:   Cindi Carbon, [place] [phone]   [name], MD  Patient Care Team: [name], MD as PCP - General (Internal Medicine) [name], MD as PCP - Electrophysiology (Cardiology) [name], MD as Consulting Physician (Dermatology) [name], MD as Consulting Physician (Cardiology) [facility] [name], MD as Consulting Physician (Dermatology)  Extended Emergency Contact Information Primary Emergency Contact: [name] of [place] Phone: [phone] Mobile Phone: [phone] Relation: Daughter Secondary Emergency Contact: Terrilyn Saver of [place] Phone: [phone] Mobile Phone: [phone] Relation: Son  Code Status:  DNR Goals of care: Advanced Directive information    10/09/2022    9:52 AM  Advanced Directives  Does Patient Have a Medical Advance Directive? Yes  Type of Paramedic of [place];Out of facility DNR (pink MOST or yellow form);Living will  Does patient want to make changes to medical advance directive? No - Patient declined  Copy of [place] in Chart? Yes - validated most recent copy scanned in chart (See row information)     Chief Complaint  Patient presents with   Acute Visit    Discoloration on foot    HPI:  Pt is a 88 y.o. female seen today for an acute visit for discoloration of the right foot.   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   Discharged from the hospital back to [facility] 10/02/22 to skilled care diagnosed with a right large PCA infarct s/p TNK . She presented with dysarthria, left facial droop and right gaze preference.    Bruising is noted to medial aspect of the foot and great toe. No pain. Does have a pink area that blanches to the right heel that is tender at times. Nurses have applied protective boots for pressure relief and a new mattress. She uses a heating pad due to cold feet. Also applies many blankets to her feet.   BP running high up to 170s at times.   Continues to work with speech therapy on D1 HT,  intake is small. Taking magic cup. No coughing or choking.   Continues with flaccid left side and left neglect. Making small gains with therapy. Uses a hoyer lift for transfers. Stands at the parallel bar one day ago. Sleepy today. Continues to have periods of lethargy and periods of alertness.   She was treated for cellulitis of the RUE with doxycycline 12/4. Redness and swelling have improved. WBC 12/11 15.1 Hgb 8.6   Past Medical History:  Diagnosis Date   Actinic keratosis 05/25/2014   Anxiety    Atrial fibrillation ([place]) 09/21/2010   Basal cell carcinoma 05/25/2014   Multiple removed by Dr. Danella Sensing in March 2015: right neck, left neck, scalp    Bell's palsy 07/18/1979   Cervicalgia 01/31/2012   Closed fracture of lumbar vertebra without mention of spinal cord injury 07/17/2005   Conjunctiva disorder 12/26/2010   Coronary atherosclerosis of native coronary artery 07/17/1997   Cramp of limb 08/16/2009   Degeneration of lumbar or lumbosacral intervertebral disc 01/31/2012   Disturbance of skin sensation 08/2009   Dizziness and giddiness 11/08/2011   vertigo   External hemorrhoids without mention of complication 93/81/8299  External hemorrhoids without mention of complication 46/27/0350   Ganglion of tendon sheath 08/16/2009   Leg cramp 10/27/2013   Most frequently the right leg.    Long term (current) use of anticoagulants 09/2010   Lumbago 07/2009   Meralgia paresthetica 07/2007   MI, old    Myalgia and myositis, unspecified 01/17/2012   Osteoporosis    Other abnormal blood  chemistry 04/08/2012   Other abnormal blood chemistry 2013   hyperglycemia   Other disorder of muscle, ligament, and fascia 04/08/2012   Other specified cardiac dysrhythmias(427.89) 06/27/2010   Pacemaker 05/06/2020   Pain in joint, ankle and foot 10/27/2013   Bilateral since 1998    Pain in joint, shoulder region 08/12/2012   Pain in joint, upper arm 12/26/2010   Pain in limb 01/11/2011   Pain in thoracic spine 01/31/2012   Pathologic fracture of vertebrae 01/22/2012   PVC's (premature ventricular contractions)    Rash and other nonspecific skin eruption 08/02/2011   Senile osteoporosis 07/17/1993   Stroke ([place])    Unspecified essential hypertension 07/17/1997   Varicose veins of lower extremities 08/16/2009   Varicose veins of lower extremities 08/16/2009   Past Surgical History:  Procedure Laterality Date   COLONOSCOPY WITH PROPOFOL N/A 10/19/2015   Procedure: COLONOSCOPY WITH PROPOFOL;  Surgeon: Gatha Mayer, MD;  Location: [facility];  Service: Endoscopy;  Laterality: N/A;   CORONARY ARTERY BYPASS GRAFT  1998   x2; LIMA to LAD; SVG to diagonal off bypass   HEMORRHOID SURGERY  08/26/2012   Dr. Brantley Stage   LOOP RECORDER INSERTION N/A 05/13/2018   Procedure: LOOP RECORDER INSERTION;  Surgeon: [name], MD;  Location: [place] [facility];  Service: Cardiovascular;  Laterality: N/A;   LOOP RECORDER REMOVAL N/A 05/26/2019   Procedure: LOOP RECORDER REMOVAL;  Surgeon: [name], MD;  Location: [place] [facility];  Service: Cardiovascular;  Laterality: N/A;   MASS EXCISION Left 11/23/2015   Procedure: LEFT WRIST EXCISION CYST;  Surgeon: Leanora Cover, MD;  Location: [place];  Service: Orthopedics;  Laterality: Left;   PACEMAKER IMPLANT N/A 05/26/2019   Procedure: PACEMAKER IMPLANT;  Surgeon: [name], MD;  Location: [place] [facility];  Service: Cardiovascular;  Laterality: N/A;   SKIN BIOPSY  01/29/14   (R) neck; (R) scalp, 2 (L) neck; shave biopsy  Superficial basal cell carcinoma Dr. Danella Sensing   TONSILLECTOMY  [phone]    Allergies  Allergen Reactions   Iodinated Contrast Media Nausea Only and Other (See Comments)    Severe nausea and also passed out   Iodine Nausea Only and Other (See Comments)    "Allergic," per [facility] PASSED OUT   Lodine [Etodolac] Other (See Comments)    "Allergic," per [facility]   Other Other (See Comments)    Seasonal Allergies   Bactrim Rash   Relafen [Nabumetone] Rash   Sulfamethoxazole-Trimethoprim Rash    In matrix   Sulfanilamide Rash    Outpatient Encounter Medications as of 10/19/2022  Medication Sig   saccharomyces boulardii (FLORASTOR) 250 MG capsule Take 250 mg by mouth 2 (two) times daily.   acetaminophen (TYLENOL) 325 MG tablet Take 650 mg by mouth 3 (three) times daily as needed (for pain).   amLODipine (NORVASC) 5 MG tablet Take 5 mg by mouth daily.   Artificial Saliva (BIOTENE DRY MOUTH MOISTURIZING) SOLN Use as directed 1-2 sprays in the mouth or throat 3 (three) times daily.   aspirin 81 MG chewable tablet Chew  1 tablet (81 mg total) by mouth daily.   atenolol (TENORMIN) 50 MG tablet Take 1 tablet (50 mg total) by mouth 2 (two) times daily.   atorvastatin (LIPITOR) 40 MG tablet Take 1 tablet (40 mg total) by mouth daily.   Calcium Carb-Cholecalciferol (CALCIUM 500 + D3 PO) Take 1 capsule by mouth in the morning.   diclofenac Sodium (VOLTAREN) 1 % GEL Apply 2 g topically as needed (BID PRN).   digoxin (LANOXIN) 0.125 MG tablet Take 1 tablet (0.125 mg total) by mouth every other day.   docusate (COLACE) 50 MG/5ML liquid Take 100 mg by mouth daily.   hydrALAZINE (APRESOLINE) 10 MG tablet Take 10 mg by mouth 3 (three) times daily as needed (SBP>180).   hydrochlorothiazide (HYDRODIURIL) 12.5 MG tablet Take 1 tablet (12.5 mg total) by mouth daily.   levocetirizine (XYZAL) 5 MG tablet Take 5 mg by mouth at bedtime.   lidocaine (LIDODERM) 5 % Place 1 patch onto the skin as needed (for back pain-  Remove & Discard patch within 12 hours or as directed by MD).   LORazepam (ATIVAN) 1 MG tablet Take 1 tablet (1 mg total) by mouth at bedtime. (Patient taking differently: Take 1 mg by mouth at bedtime as needed.)   losartan (COZAAR) 25 MG tablet Take 25 mg by mouth daily.   polyethylene glycol (MIRALAX / GLYCOLAX) 17 g packet Take 17 g by mouth See admin instructions. Mix 17 grams of powder into 4-6 oz of beverage of choice and drink once a day   potassium chloride SA (KLOR-CON) 20 MEQ tablet Take 10 mEq by mouth every evening.   No facility-administered encounter medications on file as of 10/19/2022.    Review of Systems  Constitutional:  Positive for activity change, appetite change and fatigue. Negative for chills, diaphoresis, fever and unexpected weight change.  HENT:  Positive for trouble swallowing. Negative for congestion.   Respiratory:  Negative for cough, shortness of breath and wheezing.   Cardiovascular:  Negative for chest pain, palpitations and leg swelling.  Gastrointestinal:  Negative for abdominal distention, abdominal pain, constipation and diarrhea.  Genitourinary:  Negative for difficulty urinating and dysuria.  Musculoskeletal:  Positive for gait problem. Negative for arthralgias, back pain, joint swelling and myalgias.  Skin:  Positive for color change.  Neurological:  Positive for facial asymmetry, speech difficulty and weakness. Negative for dizziness, tremors, seizures, syncope, light-headedness, numbness and headaches.  Psychiatric/Behavioral:  Negative for agitation, behavioral problems and confusion.     Immunization History  Administered Date(s) Administered   Influenza Whole 08/06/2012   Influenza, High Dose Seasonal PF 08/15/2019, 08/27/2020   Influenza,inj,Quad PF,6+ Mos 08/30/2018   Influenza-Unspecified 08/06/2013, 08/24/2014, 08/26/2015, 08/31/2016, 08/27/2017, 08/27/2020   Moderna Sars-Covid-2 Vaccination 11/16/2019, 12/16/2019, 09/16/2020    Pneumococcal Conjugate-13 09/22/2015   Pneumococcal Polysaccharide-23 08/29/2006   Td 10/07/2007   Zoster Recombinat (Shingrix) 02/12/2018, 05/13/2018   Zoster, Live 03/12/2008   Pertinent  Health Maintenance Due  Topic Date Due   INFLUENZA VACCINE  02/04/2023 (Originally 06/06/2022)   DEXA SCAN  Completed      10/01/2022    8:00 PM 10/02/2022    8:00 AM 10/03/2022   10:49 AM 10/04/2022    9:50 AM 10/06/2022   10:31 AM  Fall Risk  Falls in the past year?   '1 1 1  '$ Was there an injury with Fall?   '1 1 1  '$ Fall Risk Category Calculator   '3 3 2  '$ Fall Risk Category   High  High Moderate  Patient Fall Risk Level High fall risk High fall risk High fall risk High fall risk High fall risk  Patient at Risk for Falls Due to   History of fall(s) History of fall(s) History of fall(s)  Fall risk Follow up   Falls evaluation completed Falls evaluation completed Falls evaluation completed   Functional Status Survey:    Vitals:   10/19/22 1354  BP: (!) 146/80  Pulse: 84  Resp: 18  Temp: 98.6 F (37 C)  SpO2: 98%   There is no height or weight on file to calculate BMI. Physical Exam Vitals and nursing note reviewed.  Constitutional:      General: She is not in acute distress.    Appearance: She is not diaphoretic.  HENT:     Head: Normocephalic and atraumatic.  Neck:     Vascular: No JVD.  Cardiovascular:     Rate and Rhythm: Normal rate. Rhythm irregular.     Heart sounds: No murmur heard. Pulmonary:     Effort: Pulmonary effort is normal. No respiratory distress.     Breath sounds: Normal breath sounds. No wheezing.  Musculoskeletal:     Right lower leg: No edema.     Left lower leg: No edema.  Skin:    General: Skin is warm and dry.     Comments: Right heel with pink area that blanches.  Multiple bruises to arms and legs Right medial foot area and right great toe area with bruising.  BPPP +1 very difficult to palpate.   Neurological:     Mental Status: She is alert.      Comments: Left sided hemiplegia and neglect. Very sleepy in the morning but more alert in the afternoon.      Labs reviewed: Recent Labs    09/30/22 0931 10/01/22 0806 10/02/22 0900  NA 141 142 142  K 3.6 3.3* 3.9  CL 108 111 116*  CO2 22 20* 19*  GLUCOSE 107* 114* 108*  BUN 34* 36* 35*  CREATININE 1.25* 1.21* 1.09*  CALCIUM 8.5* 8.2* 8.2*   Recent Labs    08/01/22 0000 09/26/22 1539  AST 18 17  ALT 8 12  ALKPHOS 86 71  BILITOT  --  0.3  PROT  --  7.1  ALBUMIN 3.8 3.1*   Recent Labs    09/26/22 1539 09/29/22 0513 09/30/22 0931 10/01/22 0806 10/02/22 0900  WBC 8.2   < > 10.7* 11.0* 9.9  NEUTROABS 4.2  --   --   --   --   HGB 10.7*   < > 9.2* 8.5* 8.8*  HCT 33.5*   < > 28.9* 25.7* 27.2*  MCV 87.7   < > 88.1 87.1 86.6  PLT 151   < > 96* 87* 109*   < > = values in this interval not displayed.   Lab Results  Component Value Date   TSH 2.87 02/03/2022   Lab Results  Component Value Date   HGBA1C 5.7 (H) 09/26/2022   Lab Results  Component Value Date   CHOL 161 09/27/2022   HDL 39 (L) 09/27/2022   LDLCALC 114 (H) 09/27/2022   LDLDIRECT 81.3 08/19/2020   TRIG 39 09/27/2022   CHOLHDL 4.1 09/27/2022    Significant Diagnostic Results in last 30 days:  DG Swallowing Func-Speech Pathology  Result Date: 09/29/2022 Table formatting from the original result was not included. Objective Swallowing Evaluation: Type of Study: MBS-Modified Barium Swallow Study  Patient Details Name: Susan Conway  MRN: [id number] Date of Birth: 19-Feb-1929 Today's Date: 09/29/2022 Time: SLP Start Time (ACUTE ONLY): 4235 -SLP Stop Time (ACUTE ONLY): 0901 SLP Time Calculation (min) (ACUTE ONLY): 25 min Past Medical History: Past Medical History: Diagnosis Date  Actinic keratosis 05/25/2014  Anxiety   Atrial fibrillation ([place]) 09/21/2010  Basal cell carcinoma 05/25/2014  Multiple removed by Dr. Danella Sensing in March 2015: right neck, left neck, scalp   Bell's palsy 07/18/1979  Cervicalgia  01/31/2012  Closed fracture of lumbar vertebra without mention of spinal cord injury 07/17/2005  Conjunctiva disorder 12/26/2010  Coronary atherosclerosis of native coronary artery 07/17/1997  Cramp of limb 08/16/2009  Degeneration of lumbar or lumbosacral intervertebral disc 01/31/2012  Disturbance of skin sensation 08/2009  Dizziness and giddiness 11/08/2011  vertigo  External hemorrhoids without mention of complication 36/14/4315  External hemorrhoids without mention of complication 40/06/6760  Ganglion of tendon sheath 08/16/2009  Leg cramp 10/27/2013  Most frequently the right leg.   Long term (current) use of anticoagulants 09/2010  Lumbago 07/2009  Meralgia paresthetica 07/2007  MI, old   Myalgia and myositis, unspecified 01/17/2012  Osteoporosis   Other abnormal blood chemistry 04/08/2012  Other abnormal blood chemistry 2013  hyperglycemia  Other disorder of muscle, ligament, and fascia 04/08/2012  Other specified cardiac dysrhythmias(427.89) 06/27/2010  Pacemaker 05/06/2020  Pain in joint, ankle and foot 10/27/2013  Bilateral since 1998   Pain in joint, shoulder region 08/12/2012  Pain in joint, upper arm 12/26/2010  Pain in limb 01/11/2011  Pain in thoracic spine 01/31/2012  Pathologic fracture of vertebrae 01/22/2012  PVC's (premature ventricular contractions)   Rash and other nonspecific skin eruption 08/02/2011  Senile osteoporosis 07/17/1993  Stroke ([place])   Unspecified essential hypertension 07/17/1997  Varicose veins of lower extremities 08/16/2009  Varicose veins of lower extremities 08/16/2009 Past Surgical History: Past Surgical History: Procedure Laterality Date  COLONOSCOPY WITH PROPOFOL N/A 10/19/2015  Procedure: COLONOSCOPY WITH PROPOFOL;  Surgeon: Gatha Mayer, MD;  [facility];  Service: Endoscopy;  Laterality: N/A;  CORONARY ARTERY BYPASS GRAFT  1998  x2; LIMA to LAD; SVG to diagonal off bypass  HEMORRHOID SURGERY  08/26/2012  Dr. Brantley Stage  LOOP RECORDER INSERTION N/A 05/13/2018   Procedure: LOOP RECORDER INSERTION;  Surgeon: [name], MD;  Location: [place] [facility];  Service: Cardiovascular;  Laterality: N/A;  LOOP RECORDER REMOVAL N/A 05/26/2019  Procedure: LOOP RECORDER REMOVAL;  Surgeon: [name], MD;  Location: [place] [facility];  Service: Cardiovascular;  Laterality: N/A;  MASS EXCISION Left 11/23/2015  Procedure: LEFT WRIST EXCISION CYST;  Surgeon: Leanora Cover, MD;  Location: [place];  Service: Orthopedics;  Laterality: Left;  PACEMAKER IMPLANT N/A 05/26/2019  Procedure: PACEMAKER IMPLANT;  Surgeon: [name], MD;  Location: [place] [facility];  Service: Cardiovascular;  Laterality: N/A;  SKIN BIOPSY  01/29/14  (R) neck; (R) scalp, 2 (L) neck; shave biopsy Superficial basal cell carcinoma Dr. Danella Sensing  TONSILLECTOMY  [phone] HPI: Pt is a 88 year old female who presented with dysarthria, left facial droop, and right gaze preference. TNK given.CT head negative for acute changes. CTA neck 11/21: Occlusion of the right internal carotid artery, ccclusion of the right PCA proximally with reconstitution of flow in the P2 segment, short-segment occlusion of a right M2 branch proximally and additional focal severe stenosis of an inferior right M2 segment  proximally. PMH: atrial fibrillation not on anticoagulation due to a previous bleeding left knee Bakers cyst but maintained on aspirin, right basal  ganglia ischemic infarction in 2021 with resultant left upper extremity weakness and spasticity resulting in contracture as well as left lower extremity weakness. Pt seen for dysphagia in 2021 and was discharged on dysphagia 3 and thin liquids. Per pt's daughter, the pt subsequently received SLP services and was advanced to regular and thin liquids.  Subjective: pt orally holding during MBS  Recommendations for follow up therapy are one component of a multi-disciplinary discharge planning process, led by the attending physician.  Recommendations may be  updated based on patient status, additional functional criteria and insurance authorization. Assessment / Plan / Recommendation   09/29/2022   9:00 AM Clinical Impressions Clinical Impression MBS was completed in limited fashion given oral holding requiring suctioning of boluses from oral cavity. Daughter present and believes this may be related to taste and/or level of alertness. Oral deficits include reduced lingual manipulation and posterior propulsion. There is reduced bolus cohesion and it takes a long time and some verbal cueing to get the pt to swallow liquids. Oral residue is present with liquids as well. Pt does not swallow purees despite offering dry spoon for tactile cueing and liquid washes (would not open her mouth to accept a cup, took minimal liquid off the spoon), requiring yankauer to remove. Pharyngeal phase could not be viewed with purees. In swallowing a small amount of nectar thick liquids and purees by spoon, pt had aspiration before the swallow with nectar thick liquids with a weak, delayed cough that did not clear the airway. Suspect that this was a result of premature spillage as it occurred very quickly - before fluoro could be turned on, and while the remaining bolus was being held orally. Pt had small amounts of penetration with honey thick liquids but it all was ejected spontaneously (PAS 2). Discussed with daughter the limitations of this study, but that we could try staying on Dys 1 diet (purees) since she has been swallowing some of this upstairs, as well as honey thick liquids. Would monitor closely as a team for any signs of intolerance. SLP Visit Diagnosis Dysphagia, oropharyngeal phase (R13.12) Impact on safety and function Moderate aspiration risk;Risk for inadequate nutrition/hydration     09/29/2022   9:00 AM Treatment Recommendations Treatment Recommendations Therapy as outlined in treatment plan below     09/29/2022   9:00 AM Prognosis Prognosis for Safe Diet Advancement Good  Barriers to Reach Goals Cognitive deficits;Severity of deficits   09/29/2022   9:00 AM Diet Recommendations SLP Diet Recommendations Dysphagia 1 (Puree) solids;Honey thick liquids Liquid Administration via Cup;Spoon Medication Administration Crushed with puree Compensations Slow rate;Small sips/bites;Follow solids with liquid Postural Changes Seated upright at 90 degrees;Remain semi-upright after after feeds/meals (Comment)     09/29/2022   9:00 AM Other Recommendations Oral Care Recommendations Oral care BID Follow Up Recommendations Skilled nursing-short term rehab (<3 hours/day) Functional Status Assessment Patient has had a recent decline in their functional status and demonstrates the ability to make significant improvements in function in a reasonable and predictable amount of time.   09/29/2022   9:00 AM Frequency and Duration  Speech Therapy Frequency (ACUTE ONLY) min 2x/week Treatment Duration 2 weeks     09/29/2022   9:00 AM Oral Phase Oral Phase Impaired Oral - Honey Teaspoon Weak lingual manipulation;Reduced posterior propulsion;Decreased bolus cohesion;Lingual/palatal residue;Delayed oral transit Oral - Nectar Teaspoon Weak lingual manipulation;Reduced posterior propulsion;Decreased bolus cohesion;Lingual/palatal residue;Delayed oral transit Oral - Puree Holding of bolus;Other (Comment)    09/29/2022   9:00 AM Pharyngeal Phase Pharyngeal  Phase Impaired Pharyngeal- Honey Teaspoon Penetration/Aspiration before swallow;Reduced epiglottic inversion;Reduced tongue base retraction;Reduced airway/laryngeal closure;Pharyngeal residue - valleculae Pharyngeal Material enters airway, remains ABOVE vocal cords then ejected out Pharyngeal- Nectar Teaspoon Penetration/Aspiration before swallow;Reduced epiglottic inversion;Reduced tongue base retraction;Reduced airway/laryngeal closure;Pharyngeal residue - valleculae Pharyngeal Material enters airway, passes BELOW cords and not ejected out despite cough attempt by  patient    09/29/2022   9:00 AM Cervical Esophageal Phase  Cervical Esophageal Phase [facility] Osie Bond., M.A. [place] [facility] Office [phone] Secure chat preferred 09/29/2022, 9:48 AM                     DG CHEST PORT 1 VIEW  Result Date: 09/29/2022 CLINICAL DATA:  Aspiration into lower respiratory tract. EXAM: PORTABLE CHEST 1 VIEW COMPARISON:  08/25/2020. FINDINGS: The heart is enlarged and the mediastinal contour stable. Atherosclerotic calcification of the aorta is noted. No consolidation, effusion, or pneumothorax. No acute osseous abnormality. A single lead pacemaker device is present over the left chest. Sternotomy wires are present over the midline. IMPRESSION: 1. No active disease. 2. Cardiomegaly. Electronically Signed   By: Brett Fairy M.D.   On: 09/29/2022 01:05   ECHOCARDIOGRAM COMPLETE  Result Date: 09/27/2022    ECHOCARDIOGRAM REPORT   Patient Name:   CHRISSA MEETZE Date of Exam: 09/27/2022 Medical Rec #:  [id number]   Height:       66.0 in Accession #:    [id number]  Weight:       131.4 lb Date of Birth:  [birth year]   BSA:          1.673 m Patient Age:    76 years    BP:           167/82 mmHg Patient Gender: F           HR:           68 bpm. Exam Location:  Inpatient Procedure: 2D Echo Indications:    stroke  History:        Patient has prior history of Echocardiogram examinations, most                 recent 08/20/2020. CAD, Pacemaker, Arrythmias:PVC and Atrial                 Fibrillation; Risk Factors:Hypertension and Dyslipidemia.  Sonographer:    Johny Chess RDCS Referring Phys: [phone] MCNEILL P KIRKPATRICK  Sonographer Comments: Image acquisition challenging due to uncooperative patient. IMPRESSIONS  1. Left ventricular ejection fraction, by estimation, is 60 to 65%. The left ventricle has normal function. The left ventricle has no regional wall motion abnormalities. Left ventricular diastolic parameters are indeterminate.  2. Right ventricular systolic function  is normal. The right ventricular size is normal. There is normal pulmonary artery systolic pressure.  3. Left atrial size was moderately dilated.  4. Right atrial size was severely dilated.  5. The mitral valve is degenerative. Trivial mitral valve regurgitation. No evidence of mitral stenosis.  6. Tricuspid valve regurgitation is moderate to severe.  7. The aortic valve is tricuspid. There is severe calcifcation of the aortic valve. Aortic valve regurgitation is not visualized. Aortic valve sclerosis/calcification is present, without any evidence of aortic stenosis. Comparison(s): No significant change from prior study. FINDINGS  Left Ventricle: Left ventricular ejection fraction, by estimation, is 60 to 65%. The left ventricle has normal function. The left ventricle has no regional wall motion abnormalities. The left ventricular internal cavity size was normal in  size. There is  no left ventricular hypertrophy. Left ventricular diastolic parameters are indeterminate. Right Ventricle: The right ventricular size is normal. No increase in right ventricular wall thickness. Right ventricular systolic function is normal. There is normal pulmonary artery systolic pressure. The tricuspid regurgitant velocity is 2.73 m/s, and  with an assumed right atrial pressure of 3 mmHg, the estimated right ventricular systolic pressure is 10.1 mmHg. Left Atrium: Left atrial size was moderately dilated. Right Atrium: Right atrial size was severely dilated. Pericardium: There is no evidence of pericardial effusion. Mitral Valve: The mitral valve is degenerative in appearance. Mild to moderate mitral annular calcification. Trivial mitral valve regurgitation. No evidence of mitral valve stenosis. Tricuspid Valve: The tricuspid valve is grossly normal. Tricuspid valve regurgitation is moderate to severe. Aortic Valve: The aortic valve is tricuspid. There is severe calcifcation of the aortic valve. Aortic valve regurgitation is not  visualized. Aortic valve sclerosis/calcification is present, without any evidence of aortic stenosis. Pulmonic Valve: The pulmonic valve was normal in structure. Pulmonic valve regurgitation is trivial. No evidence of pulmonic stenosis. Aorta: The aortic root is normal in size and structure. IAS/Shunts: No atrial level shunt detected by color flow Doppler. Additional Comments: A device lead is visualized in the right ventricle and right atrium.  LEFT VENTRICLE PLAX 2D LVIDd:         3.90 cm LVIDs:         2.20 cm LV PW:         1.00 cm LV IVS:        0.90 cm LVOT diam:     1.60 cm LV SV:         28 LV SV Index:   17 LVOT Area:     2.01 cm  RIGHT VENTRICLE            IVC RV Basal diam:  2.90 cm    IVC diam: 1.80 cm RV S prime:     8.27 cm/s TAPSE (M-mode): 1.0 cm LEFT ATRIUM             Index        RIGHT ATRIUM           Index LA diam:        4.40 cm 2.63 cm/m   RA Area:     25.40 cm LA Vol (A2C):   69.8 ml 41.73 ml/m  RA Volume:   80.20 ml  47.94 ml/m LA Vol (A4C):   61.8 ml 36.94 ml/m LA Biplane Vol: 65.7 ml 39.28 ml/m  AORTIC VALVE LVOT Vmax:   75.50 cm/s LVOT Vmean:  48.400 cm/s LVOT VTI:    0.139 m  AORTA Ao Root diam: 2.90 cm Ao Asc diam:  3.10 cm TRICUSPID VALVE TR Peak grad:   29.8 mmHg TR Vmax:        273.00 cm/s  SHUNTS Systemic VTI:  0.14 m Systemic Diam: 1.60 cm Rudean Haskell MD Electronically signed by Rudean Haskell MD Signature Date/Time: 09/27/2022/4:34:47 PM    Final    CT HEAD WO CONTRAST  Result Date: 09/27/2022 CLINICAL DATA:  Stroke, follow up EXAM: CT HEAD WITHOUT CONTRAST TECHNIQUE: Contiguous axial images were obtained from the base of the skull through the vertex without intravenous contrast. RADIATION DOSE REDUCTION: This exam was performed according to the departmental dose-optimization program which includes automated exposure control, adjustment of the mA and/or kV according to patient size and/or use of iterative reconstruction technique. COMPARISON:  November  21, 23. FINDINGS: Brain:  Large acute right PCA territory infarct which involves the right thalamus held medial right temporal lobe, right hippocampus, and right occipital lobe. No evidence of acute hemorrhage, mass lesion or midline shift. No hydrocephalus. Vascular: Hyperdense right PCA. Skull: No acute fracture. Sinuses/Orbits: Bilateral sphenoid sinus opacification. Other: No acute orbital findings. IMPRESSION: 1. Large acute right PCA territory infarct. 2. Hyperdense right PCA, compatible with known occlusion. These results will be called to the ordering clinician or representative by the Radiologist Assistant, and communication documented in the PACS or [employer]. Electronically Signed   By: Margaretha Sheffield M.D.   On: 09/27/2022 13:53   CT ANGIO HEAD NECK W WO CM (CODE STROKE)  Result Date: 09/26/2022 CLINICAL DATA:  Code stroke EXAM: CT ANGIOGRAPHY HEAD AND NECK TECHNIQUE: Multidetector CT imaging of the head and neck was performed using the standard protocol during bolus administration of intravenous contrast. Multiplanar CT image reconstructions and MIPs were obtained to evaluate the vascular anatomy. Carotid stenosis measurements (when applicable) are obtained utilizing NASCET criteria, using the distal internal carotid diameter as the denominator. RADIATION DOSE REDUCTION: This exam was performed according to the departmental dose-optimization program which includes automated exposure control, adjustment of the mA and/or kV according to patient size and/or use of iterative reconstruction technique. CONTRAST:  46m OMNIPAQUE IOHEXOL 350 MG/ML SOLN COMPARISON:  Same-day noncontrast CT head, MR head and MRA head/neck 08/19/2020 FINDINGS: CTA NECK FINDINGS Aortic arch: There is calcified plaque in the imaged aortic arch. 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 The origins of the major branch vessels are patent with scattered soft and calcified plaque but no high-grade stenosis. Right carotid system: The right common carotid artery is patent but poorly opacified due to the downstream occlusion. The right internal carotid artery is occluded throughout its course. The external carotid artery is patent but poorly opacified. Left carotid system: The left common carotid artery is patent. There is calcified plaque in the proximal internal carotid artery resulting in up to approximately 50% stenosis. The distal internal carotid artery is patent but tortuous in the neck. The external carotid artery is patent. There is no dissection or aneurysm. Vertebral arteries: There is severe stenosis of the origin of the right vertebral artery and calcified plaque in the V1 segment resulting in mild stenosis. There is additional calcified plaque in the V3 segment resulting in mild-to-moderate stenosis. The left vertebral artery is patent without hemodynamically significant stenosis or occlusion in the neck. There is no evidence of dissection or aneurysm. Skeleton: There is multilevel degenerative change of the cervical spine. There is no acute osseous abnormality or suspicious osseous lesion. There is no visible canal hematoma. Other neck: The soft tissues are unremarkable. Upper chest: There is scarring in the lung apices. Review of the MIP images confirms the above findings CTA HEAD FINDINGS Anterior circulation: The right ICA is occluded throughout its course with reconstitution of flow at the M1 segment. There is short-segment occlusion of a right M2 branch proximally (8-108, 7-91). There is additional focal severe stenosis of an inferior right M2 segment proximally (7-96). There is atherosclerotic irregularity and narrowing in the remainder of the right MCA branches. There is calcified plaque in the left intracranial ICA  resulting in up to mild-to-moderate stenosis of the supraclinoid segment. There is mild stenosis of  the left M1 segment. The distal left MCA branches appear patent without proximal high-grade stenosis or occlusion. The bilateral ACAs are patent with focal severe stenosis of the left A2 segment (7-67). There is no other proximal high-grade stenosis or occlusion. There is no aneurysm or AVM. Posterior circulation: There is plaque in the right V4 segment resulting in severe stenosis proximally. There is critical stenosis/occlusion of the left vertebral artery at the V3/V4 junction with diminutive caliber of the remainder of the V4 segment. The basilar artery is patent with mild irregularity. The major cerebellar arteries appear patent. There is a fetal origin of the left PCA the left PCA appears patent, with mild-to-moderate stenosis of the proximal P2 segment (100-152). The right PCA is not identified proximally there is reconstitution of flow in the P2 segment (100-161). Note that the right PCA is also fetal in origin and primarily supplied by a now occluded posterior communicating artery. Venous sinuses: Suboptimally evaluated due to bolus timing. Anatomic variants: As above. Review of the MIP images confirms the above findings IMPRESSION: 1. 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 2. Short-segment occlusion of a right M2 branch proximally and additional focal severe stenosis of an inferior right M2 segment proximally. 3. Occlusion of the right PCA proximally with reconstitution of flow in the P2 segment. Note that the right PCA is also fetal in origin and primarily supplied by a now occluded posterior communicating artery. 4. Additional intracranial atherosclerotic disease resulting in severe stenosis of the left A2 segment, severe stenosis of the right V4 segment, and critical  stenosis/occlusion of the left vertebral artery at the V3/V4 junction with diminutive caliber of the remainder of the V4 segment. 5. Calcified plaque in the left carotid bifurcation resulting in approximately 50% stenosis. 6. Severe stenosis at the origin of the right vertebral artery. 7. 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 Recommend vascular surgery referral. Findings were communicated with Dr. Leonel Ramsay at 3:55 p.m. Electronically Signed   By: Valetta Mole M.D.   On: 09/26/2022 16:02   CT HEAD CODE STROKE WO CONTRAST  Addendum Date: 09/26/2022   ADDENDUM REPORT: 09/26/2022 15:57 ADDENDUM: Impression #2 should state chronic infract in the periventricular white matter/right corona radiata Electronically Signed   By: Marin Roberts M.D.   On: 09/26/2022 15:57   Result Date: 09/26/2022 CLINICAL DATA:  Code stroke. EXAM: CT HEAD WITHOUT CONTRAST TECHNIQUE: Contiguous axial images were obtained from the base of the skull through the vertex without intravenous contrast. RADIATION DOSE REDUCTION: This exam was performed according to the departmental dose-optimization program which includes automated exposure control, adjustment of the mA and/or kV according to patient size and/or use of iterative reconstruction technique. COMPARISON:  08/17/20 CT brain, 08/19/20 MRI brain FINDINGS: Brain: No evidence of acute infarction, hemorrhage, hydrocephalus, extra-axial collection or mass lesion/mass effect. Redemonstrated generalized volume loss with sequela of moderate to severe chronic microvascular ischemic change. Redemonstrated area of acute to subacute infarct in the periventricular white matter/right corona radiata (series 100, image 26). Vascular: No hyperdense vessel or unexpected calcification. Skull: Normal. Negative for fracture or focal lesion. Sinuses/Orbits: Bilateral lens  replacements. Paranasal sinuses are clear. Other: None. ASPECTS ([place] Stroke Program Early CT Score): 10 IMPRESSION: 1. No hemorrhage or definitive CT evidence of a new  infarct. 2. Redemonstrated area of acute to subacute infarct in the periventricular white matter/right corona radiata. Findings were paged to Dr. Leonel Ramsay on 09/26/22 at 3:31 PM. Electronically Signed: By: Marin Roberts M.D. On: 09/26/2022 15:31    Assessment/Plan 1. Senile purpura ([place]) Has area to right foot with bruising ?pressure. Also bruising to arms and legs.  Easily bruises due to age, frailty, and poor nutritional status.  Recommend pressure relief, heel boots, bed cradle Skin prep heel (area blanches) Would avoid heating pad unless close monitoring and not directly on the skin  2. Cellulitis of right upper extremity Improved after course of Doxycycline.   3. Leukocytosis, unspecified type Trending down Likely due to cellulitis  Will repeat in am  4. Lethargy Has periods of lethargy followed by periods of alertness since returning from the hospital  5. Neurologic deficit due to acute ischemic cerebrovascular accident (CVA) ([place]) Continues to work with therapy Has made small gains but continues with flaccid left side, dysphagia, and dysarthria also with periods of lethargy   6. Essential hypertension Above goal  Increase losartan to 50 mg qd   7. Permanent atrial fibrillation ([facility]) Rate is controlled on atenolol On ASA only due to prior hx of bleeding, confirmed with cardiology   8. Oropharyngeal dysphagia Currently working with speech therapy ON D1 HT  9. Poor intake Magic cup bid Weights weekly x 4     Family/ staff Communication: Susan Conway daughter.   Labs/tests ordered:  CBC with diff and CMP in am

## 2022-10-20 DIAGNOSIS — I69354 Hemiplegia and hemiparesis following cerebral infarction affecting left non-dominant side: Secondary | ICD-10-CM | POA: Diagnosis not present

## 2022-10-20 DIAGNOSIS — I69322 Dysarthria following cerebral infarction: Secondary | ICD-10-CM | POA: Diagnosis not present

## 2022-10-20 DIAGNOSIS — I69391 Dysphagia following cerebral infarction: Secondary | ICD-10-CM | POA: Diagnosis not present

## 2022-10-20 DIAGNOSIS — M62561 Muscle wasting and atrophy, not elsewhere classified, right lower leg: Secondary | ICD-10-CM | POA: Diagnosis not present

## 2022-10-20 DIAGNOSIS — R1312 Dysphagia, oropharyngeal phase: Secondary | ICD-10-CM | POA: Diagnosis not present

## 2022-10-20 DIAGNOSIS — I6621 Occlusion and stenosis of right posterior cerebral artery: Secondary | ICD-10-CM | POA: Diagnosis not present

## 2022-10-20 DIAGNOSIS — M62562 Muscle wasting and atrophy, not elsewhere classified, left lower leg: Secondary | ICD-10-CM | POA: Diagnosis not present

## 2022-10-20 DIAGNOSIS — R2689 Other abnormalities of gait and mobility: Secondary | ICD-10-CM | POA: Diagnosis not present

## 2022-10-20 DIAGNOSIS — I6521 Occlusion and stenosis of right carotid artery: Secondary | ICD-10-CM | POA: Diagnosis not present

## 2022-10-20 DIAGNOSIS — Z79899 Other long term (current) drug therapy: Secondary | ICD-10-CM | POA: Diagnosis not present

## 2022-10-20 DIAGNOSIS — R278 Other lack of coordination: Secondary | ICD-10-CM | POA: Diagnosis not present

## 2022-10-20 LAB — CBC AND DIFFERENTIAL
HCT: 28 — AB (ref 36–46)
Hemoglobin: 9 — AB (ref 12.0–16.0)
Platelets: 187 10*3/uL (ref 150–400)
WBC: 15.2

## 2022-10-20 LAB — CBC: RBC: 3.3 — AB (ref 3.87–5.11)

## 2022-10-21 DIAGNOSIS — Z79899 Other long term (current) drug therapy: Secondary | ICD-10-CM | POA: Diagnosis not present

## 2022-10-21 LAB — BASIC METABOLIC PANEL
BUN: 28 — AB (ref 4–21)
CO2: 26 — AB (ref 13–22)
Chloride: 96 — AB (ref 99–108)
Creatinine: 1.1 (ref 0.5–1.1)
Glucose: 142
Potassium: 3.9 mEq/L (ref 3.5–5.1)
Sodium: 135 — AB (ref 137–147)

## 2022-10-21 LAB — COMPREHENSIVE METABOLIC PANEL
Albumin: 3.2 — AB (ref 3.5–5.0)
Calcium: 9.2 (ref 8.7–10.7)
Globulin: 3.4
eGFR: 47

## 2022-10-21 LAB — HEPATIC FUNCTION PANEL
ALT: 69 U/L — AB (ref 7–35)
AST: 62 — AB (ref 13–35)
Alkaline Phosphatase: 110 (ref 25–125)

## 2022-10-23 ENCOUNTER — Encounter: Payer: Self-pay | Admitting: Internal Medicine

## 2022-10-23 ENCOUNTER — Non-Acute Institutional Stay (SKILLED_NURSING_FACILITY): Payer: Medicare Other | Admitting: Internal Medicine

## 2022-10-23 DIAGNOSIS — I10 Essential (primary) hypertension: Secondary | ICD-10-CM

## 2022-10-23 DIAGNOSIS — M62562 Muscle wasting and atrophy, not elsewhere classified, left lower leg: Secondary | ICD-10-CM | POA: Diagnosis not present

## 2022-10-23 DIAGNOSIS — R2689 Other abnormalities of gait and mobility: Secondary | ICD-10-CM | POA: Diagnosis not present

## 2022-10-23 DIAGNOSIS — D72829 Elevated white blood cell count, unspecified: Secondary | ICD-10-CM | POA: Diagnosis not present

## 2022-10-23 DIAGNOSIS — D631 Anemia in chronic kidney disease: Secondary | ICD-10-CM | POA: Diagnosis not present

## 2022-10-23 DIAGNOSIS — R29818 Other symptoms and signs involving the nervous system: Secondary | ICD-10-CM | POA: Diagnosis not present

## 2022-10-23 DIAGNOSIS — I4821 Permanent atrial fibrillation: Secondary | ICD-10-CM

## 2022-10-23 DIAGNOSIS — R1312 Dysphagia, oropharyngeal phase: Secondary | ICD-10-CM

## 2022-10-23 DIAGNOSIS — I6521 Occlusion and stenosis of right carotid artery: Secondary | ICD-10-CM | POA: Diagnosis not present

## 2022-10-23 DIAGNOSIS — E782 Mixed hyperlipidemia: Secondary | ICD-10-CM

## 2022-10-23 DIAGNOSIS — I639 Cerebral infarction, unspecified: Secondary | ICD-10-CM | POA: Diagnosis not present

## 2022-10-23 DIAGNOSIS — R5383 Other fatigue: Secondary | ICD-10-CM | POA: Diagnosis not present

## 2022-10-23 DIAGNOSIS — I69354 Hemiplegia and hemiparesis following cerebral infarction affecting left non-dominant side: Secondary | ICD-10-CM | POA: Diagnosis not present

## 2022-10-23 DIAGNOSIS — N1831 Chronic kidney disease, stage 3a: Secondary | ICD-10-CM | POA: Diagnosis not present

## 2022-10-23 DIAGNOSIS — I69322 Dysarthria following cerebral infarction: Secondary | ICD-10-CM | POA: Diagnosis not present

## 2022-10-23 DIAGNOSIS — M62561 Muscle wasting and atrophy, not elsewhere classified, right lower leg: Secondary | ICD-10-CM | POA: Diagnosis not present

## 2022-10-23 DIAGNOSIS — R278 Other lack of coordination: Secondary | ICD-10-CM | POA: Diagnosis not present

## 2022-10-23 DIAGNOSIS — I6621 Occlusion and stenosis of right posterior cerebral artery: Secondary | ICD-10-CM | POA: Diagnosis not present

## 2022-10-23 DIAGNOSIS — I69391 Dysphagia following cerebral infarction: Secondary | ICD-10-CM | POA: Diagnosis not present

## 2022-10-23 NOTE — Progress Notes (Unsigned)
Location:  Occupational psychologist of Service:  SNF (31) Provider:  [name], MD   [name], MD  Patient Care Team: [name], MD as PCP - General (Internal Medicine) [name], MD as PCP - Electrophysiology (Cardiology) [name], MD as Consulting Physician (Dermatology) [name], MD as Consulting Physician (Cardiology) [facility] [name], MD as Consulting Physician (Dermatology)  Extended Emergency Contact Information Primary Emergency Contact: [name] of [place] Phone: [phone] Mobile Phone: [phone] Relation: Daughter Secondary Emergency Contact: Terrilyn Saver of [place] Phone: [phone] Mobile Phone: [phone] Relation: Son  Code Status:  DNR  Goals of care: Advanced Directive information    10/23/2022   10:47 AM  Advanced Directives  Does Patient Have a Medical Advance Directive? Yes  Type of Paramedic of [place];Out of facility DNR (pink MOST or yellow form);Living will  Does patient want to make changes to medical advance directive? No - Patient declined  Copy of [place] in Chart? Yes - validated most recent copy scanned in chart (See row information)  Pre-existing out of facility DNR order (yellow form or pink MOST form) Pink MOST/Yellow Form most recent copy in chart - Physician notified to receive inpatient order     Chief Complaint  Patient presents with   Acute Visit    Patient is being seen for an acute visit.   Quality Metric Gaps    Discussed the need for Tdap and AWV    HPI:  Pt is a 88 y.o. female seen today for an acute visit for ? 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 Responding  Worked with therapy and was able to stand up with Mod assist  Eating and drinking better Moving her Left Leg little but still has Left neglect No cough Fever or SOB or dysuria     Past Medical History:  Diagnosis Date   Actinic keratosis 05/25/2014   Anxiety    Atrial fibrillation ([place]) 09/21/2010   Basal cell carcinoma 05/25/2014   Multiple removed by Dr. Danella Sensing in March 2015: right neck, left neck, scalp    Bell's palsy 07/18/1979   Cervicalgia 01/31/2012   Closed fracture of lumbar vertebra without mention of spinal cord injury 07/17/2005   Conjunctiva disorder 12/26/2010   Coronary atherosclerosis of native coronary artery 07/17/1997   Cramp of limb 08/16/2009   Degeneration of lumbar or lumbosacral intervertebral disc 01/31/2012   Disturbance of skin sensation 08/2009  Dizziness and giddiness 11/08/2011   vertigo   External hemorrhoids without mention of complication 16/08/9603   External hemorrhoids without mention of complication 54/07/8118   Ganglion of tendon sheath 08/16/2009   Leg cramp 10/27/2013   Most frequently the right leg.    Long term (current) use of anticoagulants 09/2010   Lumbago 07/2009   Meralgia paresthetica 07/2007   MI, old    Myalgia and myositis, unspecified  01/17/2012   Osteoporosis    Other abnormal blood chemistry 04/08/2012   Other abnormal blood chemistry 2013   hyperglycemia   Other disorder of muscle, ligament, and fascia 04/08/2012   Other specified cardiac dysrhythmias(427.89) 06/27/2010   Pacemaker 05/06/2020   Pain in joint, ankle and foot 10/27/2013   Bilateral since 1998    Pain in joint, shoulder region 08/12/2012   Pain in joint, upper arm 12/26/2010   Pain in limb 01/11/2011   Pain in thoracic spine 01/31/2012   Pathologic fracture of vertebrae 01/22/2012   PVC's (premature ventricular contractions)    Rash and other nonspecific skin eruption 08/02/2011   Senile osteoporosis 07/17/1993   Stroke ([place])    Unspecified essential hypertension 07/17/1997   Varicose veins of lower extremities 08/16/2009   Varicose veins of lower extremities 08/16/2009   Past Surgical History:  Procedure Laterality Date   COLONOSCOPY WITH PROPOFOL N/A 10/19/2015   Procedure: COLONOSCOPY WITH PROPOFOL;  Surgeon: Gatha Mayer, MD;  Location: [facility];  Service: Endoscopy;  Laterality: N/A;   CORONARY ARTERY BYPASS GRAFT  1998   x2; LIMA to LAD; SVG to diagonal off bypass   HEMORRHOID SURGERY  08/26/2012   Dr. Brantley Stage   LOOP RECORDER INSERTION N/A 05/13/2018   Procedure: LOOP RECORDER INSERTION;  Surgeon: [name], MD;  Location: [place] [facility];  Service: Cardiovascular;  Laterality: N/A;   LOOP RECORDER REMOVAL N/A 05/26/2019   Procedure: LOOP RECORDER REMOVAL;  Surgeon: [name], MD;  Location: [place] [facility];  Service: Cardiovascular;  Laterality: N/A;   MASS EXCISION Left 11/23/2015   Procedure: LEFT WRIST EXCISION CYST;  Surgeon: Leanora Cover, MD;  Location: [place];  Service: Orthopedics;  Laterality: Left;   PACEMAKER IMPLANT N/A 05/26/2019   Procedure: PACEMAKER IMPLANT;  Surgeon: [name], MD;  Location: [place] [facility];  Service: Cardiovascular;  Laterality: N/A;   SKIN BIOPSY   01/29/14   (R) neck; (R) scalp, 2 (L) neck; shave biopsy Superficial basal cell carcinoma Dr. Danella Sensing   TONSILLECTOMY  [phone]    Allergies  Allergen Reactions   Iodinated Contrast Media Nausea Only and Other (See Comments)    Severe nausea and also passed out   Iodine Nausea Only and Other (See Comments)    "Allergic," per [facility]- PASSED OUT   Lodine [Etodolac] Other (See Comments)    "Allergic," per [facility]   Other Other (See Comments)    Seasonal Allergies   Bactrim Rash   Relafen [Nabumetone] Rash   Sulfamethoxazole-Trimethoprim Rash    In matrix   Sulfanilamide Rash    Outpatient Encounter Medications as of 10/23/2022  Medication Sig   acetaminophen (TYLENOL) 325 MG tablet Take 650 mg by mouth 3 (three) times daily as needed (for pain).   amLODipine (NORVASC) 5 MG tablet Take 5 mg by mouth daily.   aspirin 81 MG chewable tablet Chew 1 tablet (81 mg total) by mouth daily.   atenolol (TENORMIN) 50 MG tablet Take 1 tablet (50 mg total) by  mouth 2 (two) times daily.   atorvastatin (LIPITOR) 40 MG tablet Take 1 tablet (40 mg total) by mouth daily.   Calcium Carb-Cholecalciferol (CALCIUM 500 + D3 PO) Take 1 capsule by mouth in the morning.   diclofenac Sodium (VOLTAREN) 1 % GEL Apply 2 g topically as needed (BID PRN).   digoxin (LANOXIN) 0.125 MG tablet Take 1 tablet (0.125 mg total) by mouth every other day.   docusate (COLACE) 50 MG/5ML liquid Take 100 mg by mouth daily.   hydrALAZINE (APRESOLINE) 10 MG tablet Take 10 mg by mouth 3 (three) times daily as needed (SBP>180).   hydrochlorothiazide (HYDRODIURIL) 12.5 MG tablet Take 1 tablet (12.5 mg total) by mouth daily.   levocetirizine (XYZAL) 5 MG tablet Take 5 mg by mouth at bedtime.   lidocaine (LIDODERM) 5 % Place 1 patch onto the skin as needed (for back pain- Remove & Discard patch within 12 hours or as directed by MD).   LORazepam (ATIVAN) 1 MG tablet Take 1 tablet (1 mg total) by mouth at bedtime. (Patient taking differently:  Take 1 mg by mouth at bedtime as needed.)   losartan (COZAAR) 25 MG tablet Take 50 mg by mouth daily.   polyethylene glycol (MIRALAX / GLYCOLAX) 17 g packet Take 17 g by mouth See admin instructions. Mix 17 grams of powder into 4-6 oz of beverage of choice and drink once a day   potassium chloride SA (KLOR-CON) 20 MEQ tablet Take 10 mEq by mouth every evening.   Artificial Saliva (BIOTENE DRY MOUTH MOISTURIZING) SOLN Use as directed 1-2 sprays in the mouth or throat 3 (three) times daily.   No facility-administered encounter medications on file as of 10/23/2022.    Review of Systems  Constitutional:  Positive for activity change. Negative for appetite change.  HENT: Negative.    Respiratory:  Negative for cough and shortness of breath.   Cardiovascular:  Negative for leg swelling.  Gastrointestinal:  Negative for constipation.  Genitourinary: Negative.   Musculoskeletal:  Positive for gait problem. Negative for arthralgias and myalgias.  Skin: Negative.   Neurological:  Positive for weakness. Negative for dizziness.  Psychiatric/Behavioral:  Negative for confusion, dysphoric mood and sleep disturbance.     Immunization History  Administered Date(s) Administered   Influenza Whole 08/06/2012   Influenza, High Dose Seasonal PF 08/15/2019, 08/27/2020   Influenza,inj,Quad PF,6+ Mos 08/30/2018   Influenza-Unspecified 08/06/2013, 08/24/2014, 08/26/2015, 08/31/2016, 08/27/2017, 08/27/2020   Moderna Sars-Covid-2 Vaccination 11/16/2019, 12/16/2019, 09/16/2020   Pneumococcal Conjugate-13 09/22/2015   Pneumococcal Polysaccharide-23 08/29/2006   Td 10/07/2007   Zoster Recombinat (Shingrix) 02/12/2018, 05/13/2018   Zoster, Live 03/12/2008   Pertinent  Health Maintenance Due  Topic Date Due   INFLUENZA VACCINE  02/04/2023 (Originally 06/06/2022)   DEXA SCAN  Completed      10/01/2022    8:00 PM 10/02/2022    8:00 AM 10/03/2022   10:49 AM 10/04/2022    9:50 AM 10/06/2022   10:31 AM  Fall  Risk  Falls in the past year?   '1 1 1  '$ Was there an injury with Fall?   '1 1 1  '$ Fall Risk Category Calculator   '3 3 2  '$ Fall Risk Category   High High Moderate  Patient Fall Risk Level High fall risk High fall risk High fall risk High fall risk High fall risk  Patient at Risk for Falls Due to   History of fall(s) History of fall(s) History of fall(s)  Fall risk Follow up  Falls evaluation completed Falls evaluation completed Falls evaluation completed   Functional Status Survey:    Vitals:   10/23/22 1042  BP: (!) 171/73  Pulse: 82  Resp: 14  Temp: 97.6 F (36.4 C)  TempSrc: Temporal  SpO2: 98%  Weight: 130 lb (59 kg)  Height: '5\' 6"'$  (1.676 m)   Body mass index is 20.98 kg/m. Physical Exam Vitals reviewed.  Constitutional:      Appearance: Normal appearance.  HENT:     Head: Normocephalic.     Nose: Nose normal.     Mouth/Throat:     Mouth: Mucous membranes are moist.     Pharynx: Oropharynx is clear.  Eyes:     Pupils: Pupils are equal, round, and reactive to light.  Cardiovascular:     Rate and Rhythm: Normal rate. Rhythm irregular.     Pulses: Normal pulses.     Heart sounds: Normal heart sounds. No murmur heard. Pulmonary:     Effort: Pulmonary effort is normal.     Breath sounds: Normal breath sounds.  Abdominal:     General: Abdomen is flat. Bowel sounds are normal.     Palpations: Abdomen is soft.  Musculoskeletal:        General: No swelling.     Cervical back: Neck supple.  Skin:    General: Skin is warm.  Neurological:     Mental Status: She is alert.     Comments: Speech Slurred but clear Left sided neglect 0/5 in Left UE 1/5 in Left LE  Psychiatric:        Mood and Affect: Mood normal.        Thought Content: Thought content normal.     Labs reviewed: Recent Labs    09/30/22 0931 10/01/22 0806 10/02/22 0900  NA 141 142 142  K 3.6 3.3* 3.9  CL 108 111 116*  CO2 22 20* 19*  GLUCOSE 107* 114* 108*  BUN 34* 36* 35*  CREATININE 1.25*  1.21* 1.09*  CALCIUM 8.5* 8.2* 8.2*   Recent Labs    08/01/22 0000 09/26/22 1539  AST 18 17  ALT 8 12  ALKPHOS 86 71  BILITOT  --  0.3  PROT  --  7.1  ALBUMIN 3.8 3.1*   Recent Labs    09/30/22 0931 10/01/22 0806 10/02/22 0900 10/09/22 0000 10/12/22 0000 10/16/22 0000  WBC 10.7* 11.0* 9.9 18.8 17.7 15.1  NEUTROABS  --   --   --  11.10 11.60 9.40  HGB 9.2* 8.5* 8.8* 9.3* 9.7* 8.6*  HCT 28.9* 25.7* 27.2* 29* 30* 27*  MCV 88.1 87.1 86.6  --   --   --   PLT 96* 87* 109* 179 210 217   Lab Results  Component Value Date   TSH 2.87 02/03/2022   Lab Results  Component Value Date   HGBA1C 5.7 (H) 09/26/2022   Lab Results  Component Value Date   CHOL 161 09/27/2022   HDL 39 (L) 09/27/2022   LDLCALC 114 (H) 09/27/2022   LDLDIRECT 81.3 08/19/2020   TRIG 39 09/27/2022   CHOLHDL 4.1 09/27/2022    Significant Diagnostic Results in last 30 days:  DG Swallowing Func-Speech Pathology  Result Date: 09/29/2022 Table formatting from the original result was not included. Objective Swallowing Evaluation: Type of Study: MBS-Modified Barium Swallow Study  Patient Details Name: Susan Conway MRN: [id number] Date of Birth: Aug 31, 1929 Today's Date: 09/29/2022 Time: SLP Start Time (ACUTE ONLY): 0836 -SLP Stop Time (ACUTE ONLY): 0901 SLP  Time Calculation (min) (ACUTE ONLY): 25 min Past Medical History: Past Medical History: Diagnosis Date  Actinic keratosis 05/25/2014  Anxiety   Atrial fibrillation ([place]) 09/21/2010  Basal cell carcinoma 05/25/2014  Multiple removed by Dr. Danella Sensing in March 2015: right neck, left neck, scalp   Bell's palsy 07/18/1979  Cervicalgia 01/31/2012  Closed fracture of lumbar vertebra without mention of spinal cord injury 07/17/2005  Conjunctiva disorder 12/26/2010  Coronary atherosclerosis of native coronary artery 07/17/1997  Cramp of limb 08/16/2009  Degeneration of lumbar or lumbosacral intervertebral disc 01/31/2012  Disturbance of skin sensation 08/2009  Dizziness  and giddiness 11/08/2011  vertigo  External hemorrhoids without mention of complication 32/99/2426  External hemorrhoids without mention of complication 83/41/9622  Ganglion of tendon sheath 08/16/2009  Leg cramp 10/27/2013  Most frequently the right leg.   Long term (current) use of anticoagulants 09/2010  Lumbago 07/2009  Meralgia paresthetica 07/2007  MI, old   Myalgia and myositis, unspecified 01/17/2012  Osteoporosis   Other abnormal blood chemistry 04/08/2012  Other abnormal blood chemistry 2013  hyperglycemia  Other disorder of muscle, ligament, and fascia 04/08/2012  Other specified cardiac dysrhythmias(427.89) 06/27/2010  Pacemaker 05/06/2020  Pain in joint, ankle and foot 10/27/2013  Bilateral since 1998   Pain in joint, shoulder region 08/12/2012  Pain in joint, upper arm 12/26/2010  Pain in limb 01/11/2011  Pain in thoracic spine 01/31/2012  Pathologic fracture of vertebrae 01/22/2012  PVC's (premature ventricular contractions)   Rash and other nonspecific skin eruption 08/02/2011  Senile osteoporosis 07/17/1993  Stroke ([place])   Unspecified essential hypertension 07/17/1997  Varicose veins of lower extremities 08/16/2009  Varicose veins of lower extremities 08/16/2009 Past Surgical History: Past Surgical History: Procedure Laterality Date  COLONOSCOPY WITH PROPOFOL N/A 10/19/2015  Procedure: COLONOSCOPY WITH PROPOFOL;  Surgeon: Gatha Mayer, MD;  [facility];  Service: Endoscopy;  Laterality: N/A;  CORONARY ARTERY BYPASS GRAFT  1998  x2; LIMA to LAD; SVG to diagonal off bypass  HEMORRHOID SURGERY  08/26/2012  Dr. Brantley Stage  LOOP RECORDER INSERTION N/A 05/13/2018  Procedure: LOOP RECORDER INSERTION;  Surgeon: [name], MD;  Location: [place] [facility];  Service: Cardiovascular;  Laterality: N/A;  LOOP RECORDER REMOVAL N/A 05/26/2019  Procedure: LOOP RECORDER REMOVAL;  Surgeon: [name], MD;  Location: [place] [facility];  Service: Cardiovascular;  Laterality: N/A;  MASS EXCISION Left  11/23/2015  Procedure: LEFT WRIST EXCISION CYST;  Surgeon: Leanora Cover, MD;  Location: [place];  Service: Orthopedics;  Laterality: Left;  PACEMAKER IMPLANT N/A 05/26/2019  Procedure: PACEMAKER IMPLANT;  Surgeon: [name], MD;  Location: [place] [facility];  Service: Cardiovascular;  Laterality: N/A;  SKIN BIOPSY  01/29/14  (R) neck; (R) scalp, 2 (L) neck; shave biopsy Superficial basal cell carcinoma Dr. Danella Sensing  TONSILLECTOMY  [phone] HPI: Pt is a 88 year old female who presented with dysarthria, left facial droop, and right gaze preference. TNK given.CT head negative for acute changes. CTA neck 11/21: Occlusion of the right internal carotid artery, ccclusion of the right PCA proximally with reconstitution of flow in the P2 segment, short-segment occlusion of a right M2 branch proximally and additional focal severe stenosis of an inferior right M2 segment  proximally. PMH: atrial fibrillation not on anticoagulation due to a previous bleeding left knee Bakers cyst but maintained on aspirin, right basal ganglia ischemic infarction in 2021 with resultant left upper extremity weakness and spasticity resulting in contracture as well as left lower extremity weakness.  Pt seen for dysphagia in 2021 and was discharged on dysphagia 3 and thin liquids. Per pt's daughter, the pt subsequently received SLP services and was advanced to regular and thin liquids.  Subjective: pt orally holding during MBS  Recommendations for follow up therapy are one component of a multi-disciplinary discharge planning process, led by the attending physician.  Recommendations may be updated based on patient status, additional functional criteria and insurance authorization. Assessment / Plan / Recommendation   09/29/2022   9:00 AM Clinical Impressions Clinical Impression MBS was completed in limited fashion given oral holding requiring suctioning of boluses from oral cavity. Daughter present and believes this may be  related to taste and/or level of alertness. Oral deficits include reduced lingual manipulation and posterior propulsion. There is reduced bolus cohesion and it takes a long time and some verbal cueing to get the pt to swallow liquids. Oral residue is present with liquids as well. Pt does not swallow purees despite offering dry spoon for tactile cueing and liquid washes (would not open her mouth to accept a cup, took minimal liquid off the spoon), requiring yankauer to remove. Pharyngeal phase could not be viewed with purees. In swallowing a small amount of nectar thick liquids and purees by spoon, pt had aspiration before the swallow with nectar thick liquids with a weak, delayed cough that did not clear the airway. Suspect that this was a result of premature spillage as it occurred very quickly - before fluoro could be turned on, and while the remaining bolus was being held orally. Pt had small amounts of penetration with honey thick liquids but it all was ejected spontaneously (PAS 2). Discussed with daughter the limitations of this study, but that we could try staying on Dys 1 diet (purees) since she has been swallowing some of this upstairs, as well as honey thick liquids. Would monitor closely as a team for any signs of intolerance. SLP Visit Diagnosis Dysphagia, oropharyngeal phase (R13.12) Impact on safety and function Moderate aspiration risk;Risk for inadequate nutrition/hydration     09/29/2022   9:00 AM Treatment Recommendations Treatment Recommendations Therapy as outlined in treatment plan below     09/29/2022   9:00 AM Prognosis Prognosis for Safe Diet Advancement Good Barriers to Reach Goals Cognitive deficits;Severity of deficits   09/29/2022   9:00 AM Diet Recommendations SLP Diet Recommendations Dysphagia 1 (Puree) solids;Honey thick liquids Liquid Administration via Cup;Spoon Medication Administration Crushed with puree Compensations Slow rate;Small sips/bites;Follow solids with liquid Postural  Changes Seated upright at 90 degrees;Remain semi-upright after after feeds/meals (Comment)     09/29/2022   9:00 AM Other Recommendations Oral Care Recommendations Oral care BID Follow Up Recommendations Skilled nursing-short term rehab (<3 hours/day) Functional Status Assessment Patient has had a recent decline in their functional status and demonstrates the ability to make significant improvements in function in a reasonable and predictable amount of time.   09/29/2022   9:00 AM Frequency and Duration  Speech Therapy Frequency (ACUTE ONLY) min 2x/week Treatment Duration 2 weeks     09/29/2022   9:00 AM Oral Phase Oral Phase Impaired Oral - Honey Teaspoon Weak lingual manipulation;Reduced posterior propulsion;Decreased bolus cohesion;Lingual/palatal residue;Delayed oral transit Oral - Nectar Teaspoon Weak lingual manipulation;Reduced posterior propulsion;Decreased bolus cohesion;Lingual/palatal residue;Delayed oral transit Oral - Puree Holding of bolus;Other (Comment)    09/29/2022   9:00 AM Pharyngeal Phase Pharyngeal Phase Impaired Pharyngeal- Honey Teaspoon Penetration/Aspiration before swallow;Reduced epiglottic inversion;Reduced tongue base retraction;Reduced airway/laryngeal closure;Pharyngeal residue - valleculae Pharyngeal Material enters airway, remains  ABOVE vocal cords then ejected out Pharyngeal- Nectar Teaspoon Penetration/Aspiration before swallow;Reduced epiglottic inversion;Reduced tongue base retraction;Reduced airway/laryngeal closure;Pharyngeal residue - valleculae Pharyngeal Material enters airway, passes BELOW cords and not ejected out despite cough attempt by patient    09/29/2022   9:00 AM Cervical Esophageal Phase  Cervical Esophageal Phase [facility] Osie Bond., M.A. [place] [facility] Office [phone] Secure chat preferred 09/29/2022, 9:48 AM                     DG CHEST PORT 1 VIEW  Result Date: 09/29/2022 CLINICAL DATA:  Aspiration into lower respiratory tract. EXAM:  PORTABLE CHEST 1 VIEW COMPARISON:  08/25/2020. FINDINGS: The heart is enlarged and the mediastinal contour stable. Atherosclerotic calcification of the aorta is noted. No consolidation, effusion, or pneumothorax. No acute osseous abnormality. A single lead pacemaker device is present over the left chest. Sternotomy wires are present over the midline. IMPRESSION: 1. No active disease. 2. Cardiomegaly. Electronically Signed   By: Brett Fairy M.D.   On: 09/29/2022 01:05   ECHOCARDIOGRAM COMPLETE  Result Date: 09/27/2022    ECHOCARDIOGRAM REPORT   Patient Name:   SHELISA FERN Date of Exam: 09/27/2022 Medical Rec #:  [id number]   Height:       66.0 in Accession #:    [id number]  Weight:       131.4 lb Date of Birth:  [birth year]   BSA:          1.673 m Patient Age:    74 years    BP:           167/82 mmHg Patient Gender: F           HR:           68 bpm. Exam Location:  Inpatient Procedure: 2D Echo Indications:    stroke  History:        Patient has prior history of Echocardiogram examinations, most                 recent 08/20/2020. CAD, Pacemaker, Arrythmias:PVC and Atrial                 Fibrillation; Risk Factors:Hypertension and Dyslipidemia.  Sonographer:    Johny Chess RDCS Referring Phys: [phone] MCNEILL P KIRKPATRICK  Sonographer Comments: Image acquisition challenging due to uncooperative patient. IMPRESSIONS  1. Left ventricular ejection fraction, by estimation, is 60 to 65%. The left ventricle has normal function. The left ventricle has no regional wall motion abnormalities. Left ventricular diastolic parameters are indeterminate.  2. Right ventricular systolic function is normal. The right ventricular size is normal. There is normal pulmonary artery systolic pressure.  3. Left atrial size was moderately dilated.  4. Right atrial size was severely dilated.  5. The mitral valve is degenerative. Trivial mitral valve regurgitation. No evidence of mitral stenosis.  6. Tricuspid valve regurgitation is  moderate to severe.  7. The aortic valve is tricuspid. There is severe calcifcation of the aortic valve. Aortic valve regurgitation is not visualized. Aortic valve sclerosis/calcification is present, without any evidence of aortic stenosis. Comparison(s): No significant change from prior study. FINDINGS  Left Ventricle: Left ventricular ejection fraction, by estimation, is 60 to 65%. The left ventricle has normal function. The left ventricle has no regional wall motion abnormalities. The left ventricular internal cavity size was normal in size. There is  no left ventricular hypertrophy. Left ventricular diastolic parameters are indeterminate. Right Ventricle: The right ventricular size is normal. No  increase in right ventricular wall thickness. Right ventricular systolic function is normal. There is normal pulmonary artery systolic pressure. The tricuspid regurgitant velocity is 2.73 m/s, and  with an assumed right atrial pressure of 3 mmHg, the estimated right ventricular systolic pressure is 98.3 mmHg. Left Atrium: Left atrial size was moderately dilated. Right Atrium: Right atrial size was severely dilated. Pericardium: There is no evidence of pericardial effusion. Mitral Valve: The mitral valve is degenerative in appearance. Mild to moderate mitral annular calcification. Trivial mitral valve regurgitation. No evidence of mitral valve stenosis. Tricuspid Valve: The tricuspid valve is grossly normal. Tricuspid valve regurgitation is moderate to severe. Aortic Valve: The aortic valve is tricuspid. There is severe calcifcation of the aortic valve. Aortic valve regurgitation is not visualized. Aortic valve sclerosis/calcification is present, without any evidence of aortic stenosis. Pulmonic Valve: The pulmonic valve was normal in structure. Pulmonic valve regurgitation is trivial. No evidence of pulmonic stenosis. Aorta: The aortic root is normal in size and structure. IAS/Shunts: No atrial level shunt detected by  color flow Doppler. Additional Comments: A device lead is visualized in the right ventricle and right atrium.  LEFT VENTRICLE PLAX 2D LVIDd:         3.90 cm LVIDs:         2.20 cm LV PW:         1.00 cm LV IVS:        0.90 cm LVOT diam:     1.60 cm LV SV:         28 LV SV Index:   17 LVOT Area:     2.01 cm  RIGHT VENTRICLE            IVC RV Basal diam:  2.90 cm    IVC diam: 1.80 cm RV S prime:     8.27 cm/s TAPSE (M-mode): 1.0 cm LEFT ATRIUM             Index        RIGHT ATRIUM           Index LA diam:        4.40 cm 2.63 cm/m   RA Area:     25.40 cm LA Vol (A2C):   69.8 ml 41.73 ml/m  RA Volume:   80.20 ml  47.94 ml/m LA Vol (A4C):   61.8 ml 36.94 ml/m LA Biplane Vol: 65.7 ml 39.28 ml/m  AORTIC VALVE LVOT Vmax:   75.50 cm/s LVOT Vmean:  48.400 cm/s LVOT VTI:    0.139 m  AORTA Ao Root diam: 2.90 cm Ao Asc diam:  3.10 cm TRICUSPID VALVE TR Peak grad:   29.8 mmHg TR Vmax:        273.00 cm/s  SHUNTS Systemic VTI:  0.14 m Systemic Diam: 1.60 cm Rudean Haskell MD Electronically signed by Rudean Haskell MD Signature Date/Time: 09/27/2022/4:34:47 PM    Final    CT HEAD WO CONTRAST  Result Date: 09/27/2022 CLINICAL DATA:  Stroke, follow up EXAM: CT HEAD WITHOUT CONTRAST TECHNIQUE: Contiguous axial images were obtained from the base of the skull through the vertex without intravenous contrast. RADIATION DOSE REDUCTION: This exam was performed according to the departmental dose-optimization program which includes automated exposure control, adjustment of the mA and/or kV according to patient size and/or use of iterative reconstruction technique. COMPARISON:  November 21, 23. FINDINGS: Brain: Large acute right PCA territory infarct which involves the right thalamus held medial right temporal lobe, right hippocampus, and right occipital lobe. No evidence  of acute hemorrhage, mass lesion or midline shift. No hydrocephalus. Vascular: Hyperdense right PCA. Skull: No acute fracture. Sinuses/Orbits:  Bilateral sphenoid sinus opacification. Other: No acute orbital findings. IMPRESSION: 1. Large acute right PCA territory infarct. 2. Hyperdense right PCA, compatible with known occlusion. These results will be called to the ordering clinician or representative by the Radiologist Assistant, and communication documented in the PACS or [employer]. Electronically Signed   By: Margaretha Sheffield M.D.   On: 09/27/2022 13:53   CT ANGIO HEAD NECK W WO CM (CODE STROKE)  Result Date: 09/26/2022 CLINICAL DATA:  Code stroke EXAM: CT ANGIOGRAPHY HEAD AND NECK TECHNIQUE: Multidetector CT imaging of the head and neck was performed using the standard protocol during bolus administration of intravenous contrast. Multiplanar CT image reconstructions and MIPs were obtained to evaluate the vascular anatomy. Carotid stenosis measurements (when applicable) are obtained utilizing NASCET criteria, using the distal internal carotid diameter as the denominator. RADIATION DOSE REDUCTION: This exam was performed according to the departmental dose-optimization program which includes automated exposure control, adjustment of the mA and/or kV according to patient size and/or use of iterative reconstruction technique. CONTRAST:  49m OMNIPAQUE IOHEXOL 350 MG/ML SOLN COMPARISON:  Same-day noncontrast CT head, MR head and MRA head/neck 08/19/2020 FINDINGS: CTA NECK FINDINGS Aortic arch: There is calcified plaque in the imaged aortic arch. 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 The origins of the major branch vessels are patent with scattered soft and calcified plaque but no high-grade stenosis. Right carotid system: The right common carotid artery is patent but poorly opacified due to the downstream occlusion. The right internal carotid  artery is occluded throughout its course. The external carotid artery is patent but poorly opacified. Left carotid system: The left common carotid artery is patent. There is calcified plaque in the proximal internal carotid artery resulting in up to approximately 50% stenosis. The distal internal carotid artery is patent but tortuous in the neck. The external carotid artery is patent. There is no dissection or aneurysm. Vertebral arteries: There is severe stenosis of the origin of the right vertebral artery and calcified plaque in the V1 segment resulting in mild stenosis. There is additional calcified plaque in the V3 segment resulting in mild-to-moderate stenosis. The left vertebral artery is patent without hemodynamically significant stenosis or occlusion in the neck. There is no evidence of dissection or aneurysm. Skeleton: There is multilevel degenerative change of the cervical spine. There is no acute osseous abnormality or suspicious osseous lesion. There is no visible canal hematoma. Other neck: The soft tissues are unremarkable. Upper chest: There is scarring in the lung apices. Review of the MIP images confirms the above findings CTA HEAD FINDINGS Anterior circulation: The right ICA is occluded throughout its course with reconstitution of flow at the M1 segment. There is short-segment occlusion of a right M2 branch proximally (8-108, 7-91). There is additional focal severe stenosis of an inferior right M2 segment proximally (7-96). There is atherosclerotic irregularity and narrowing in the remainder of the right MCA branches. There is calcified plaque in the left intracranial ICA resulting in up to mild-to-moderate stenosis of the supraclinoid segment. There is mild stenosis of the left M1 segment. The distal left MCA branches appear patent without proximal high-grade stenosis or occlusion. The bilateral ACAs are patent with focal  severe stenosis of the left A2 segment (7-67). There is no other proximal  high-grade stenosis or occlusion. There is no aneurysm or AVM. Posterior circulation: There is plaque in the right V4 segment resulting in severe stenosis proximally. There is critical stenosis/occlusion of the left vertebral artery at the V3/V4 junction with diminutive caliber of the remainder of the V4 segment. The basilar artery is patent with mild irregularity. The major cerebellar arteries appear patent. There is a fetal origin of the left PCA the left PCA appears patent, with mild-to-moderate stenosis of the proximal P2 segment (100-152). The right PCA is not identified proximally there is reconstitution of flow in the P2 segment (100-161). Note that the right PCA is also fetal in origin and primarily supplied by a now occluded posterior communicating artery. Venous sinuses: Suboptimally evaluated due to bolus timing. Anatomic variants: As above. Review of the MIP images confirms the above findings IMPRESSION: 1. 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 2. Short-segment occlusion of a right M2 branch proximally and additional focal severe stenosis of an inferior right M2 segment proximally. 3. Occlusion of the right PCA proximally with reconstitution of flow in the P2 segment. Note that the right PCA is also fetal in origin and primarily supplied by a now occluded posterior communicating artery. 4. Additional intracranial atherosclerotic disease resulting in severe stenosis of the left A2 segment, severe stenosis of the right V4 segment, and critical stenosis/occlusion of the left vertebral artery at the V3/V4 junction with diminutive caliber of the remainder of the V4 segment. 5. Calcified plaque in the left carotid bifurcation resulting in approximately 50% stenosis. 6. Severe stenosis at the origin of the right vertebral artery. 7. 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 Recommend vascular surgery referral. Findings were communicated with Dr. Leonel Ramsay at 3:55 p.m. Electronically Signed   By: Valetta Mole M.D.   On: 09/26/2022 16:02   CT HEAD CODE STROKE WO CONTRAST  Addendum Date: 09/26/2022   ADDENDUM REPORT: 09/26/2022 15:57 ADDENDUM: Impression #2 should state chronic infract in the periventricular white matter/right corona radiata Electronically Signed   By: Marin Roberts M.D.   On: 09/26/2022 15:57   Result Date: 09/26/2022 CLINICAL DATA:  Code stroke. EXAM: CT HEAD WITHOUT CONTRAST TECHNIQUE: Contiguous axial images were obtained from the base of the skull through the vertex without intravenous contrast. RADIATION DOSE REDUCTION: This exam was performed according to the departmental dose-optimization program which includes automated exposure control, adjustment of the mA and/or kV according to patient size and/or use of iterative reconstruction technique. COMPARISON:  08/17/20 CT brain, 08/19/20 MRI brain FINDINGS: Brain: No evidence of acute infarction, hemorrhage, hydrocephalus, extra-axial collection or mass lesion/mass effect. Redemonstrated generalized volume loss with sequela of moderate to severe chronic microvascular ischemic change. Redemonstrated area of acute to subacute infarct in the periventricular white matter/right corona radiata (series 100, image 26). Vascular: No hyperdense vessel or unexpected calcification. Skull: Normal. Negative for fracture or focal lesion. Sinuses/Orbits: Bilateral lens replacements. Paranasal sinuses are clear. Other: None. ASPECTS ([place] Stroke Program Early CT Score): 10 IMPRESSION: 1. No hemorrhage or definitive CT evidence of a new infarct. 2. Redemonstrated area of acute to subacute infarct in the periventricular white matter/right corona radiata. Findings were paged to Dr. Leonel Ramsay on  09/26/22 at 3:31 PM. Electronically Signed: By: Marin Roberts M.D. On: 09/26/2022 15:31    Assessment/Plan 1. Neurologic deficit due to acute ischemic cerebrovascular accident (CVA) [facility]) Doing well with therapy Aspirin and Statin  2. Leukocytosis, unspecified type No Sigms of infection Will continue to monitor  3. Lethargy Today at her Baseline Contineu to moniotr   4. Essential hypertension BP better Cozaar Recently incrased Also oN nOrvasc and HCTZ  5. Permanent atrial fibrillation (HCC) On Aspirin and Toprol Has had bleeding issues with DOCA and Coumadin Daughter has decided for Aspirin only for now    6. Oropharyngeal dysphagia ***  7. Mixed hyperlipidemia ***  8. Anemia due to stage 3a chronic kidney disease ([place]) Repeat CBC and Iron studies    Family/ staff Communication: ***  Labs/tests ordered:  ***

## 2022-10-23 NOTE — Progress Notes (Signed)
Abstraction  

## 2022-10-24 DIAGNOSIS — R278 Other lack of coordination: Secondary | ICD-10-CM | POA: Diagnosis not present

## 2022-10-24 DIAGNOSIS — I69354 Hemiplegia and hemiparesis following cerebral infarction affecting left non-dominant side: Secondary | ICD-10-CM | POA: Diagnosis not present

## 2022-10-24 DIAGNOSIS — M62561 Muscle wasting and atrophy, not elsewhere classified, right lower leg: Secondary | ICD-10-CM | POA: Diagnosis not present

## 2022-10-24 DIAGNOSIS — R1312 Dysphagia, oropharyngeal phase: Secondary | ICD-10-CM | POA: Diagnosis not present

## 2022-10-24 DIAGNOSIS — I6521 Occlusion and stenosis of right carotid artery: Secondary | ICD-10-CM | POA: Diagnosis not present

## 2022-10-24 DIAGNOSIS — I69391 Dysphagia following cerebral infarction: Secondary | ICD-10-CM | POA: Diagnosis not present

## 2022-10-24 DIAGNOSIS — M62562 Muscle wasting and atrophy, not elsewhere classified, left lower leg: Secondary | ICD-10-CM | POA: Diagnosis not present

## 2022-10-24 DIAGNOSIS — I6621 Occlusion and stenosis of right posterior cerebral artery: Secondary | ICD-10-CM | POA: Diagnosis not present

## 2022-10-24 DIAGNOSIS — R2689 Other abnormalities of gait and mobility: Secondary | ICD-10-CM | POA: Diagnosis not present

## 2022-10-24 DIAGNOSIS — I69322 Dysarthria following cerebral infarction: Secondary | ICD-10-CM | POA: Diagnosis not present

## 2022-10-25 DIAGNOSIS — I6521 Occlusion and stenosis of right carotid artery: Secondary | ICD-10-CM | POA: Diagnosis not present

## 2022-10-25 DIAGNOSIS — I6621 Occlusion and stenosis of right posterior cerebral artery: Secondary | ICD-10-CM | POA: Diagnosis not present

## 2022-10-25 DIAGNOSIS — I69322 Dysarthria following cerebral infarction: Secondary | ICD-10-CM | POA: Diagnosis not present

## 2022-10-25 DIAGNOSIS — R2689 Other abnormalities of gait and mobility: Secondary | ICD-10-CM | POA: Diagnosis not present

## 2022-10-25 DIAGNOSIS — I69391 Dysphagia following cerebral infarction: Secondary | ICD-10-CM | POA: Diagnosis not present

## 2022-10-25 DIAGNOSIS — M62561 Muscle wasting and atrophy, not elsewhere classified, right lower leg: Secondary | ICD-10-CM | POA: Diagnosis not present

## 2022-10-25 DIAGNOSIS — M62562 Muscle wasting and atrophy, not elsewhere classified, left lower leg: Secondary | ICD-10-CM | POA: Diagnosis not present

## 2022-10-25 DIAGNOSIS — R278 Other lack of coordination: Secondary | ICD-10-CM | POA: Diagnosis not present

## 2022-10-25 DIAGNOSIS — I69354 Hemiplegia and hemiparesis following cerebral infarction affecting left non-dominant side: Secondary | ICD-10-CM | POA: Diagnosis not present

## 2022-10-25 DIAGNOSIS — R1312 Dysphagia, oropharyngeal phase: Secondary | ICD-10-CM | POA: Diagnosis not present

## 2022-10-26 DIAGNOSIS — I69322 Dysarthria following cerebral infarction: Secondary | ICD-10-CM | POA: Diagnosis not present

## 2022-10-26 DIAGNOSIS — I69354 Hemiplegia and hemiparesis following cerebral infarction affecting left non-dominant side: Secondary | ICD-10-CM | POA: Diagnosis not present

## 2022-10-26 DIAGNOSIS — R1312 Dysphagia, oropharyngeal phase: Secondary | ICD-10-CM | POA: Diagnosis not present

## 2022-10-26 DIAGNOSIS — I6521 Occlusion and stenosis of right carotid artery: Secondary | ICD-10-CM | POA: Diagnosis not present

## 2022-10-26 DIAGNOSIS — R2689 Other abnormalities of gait and mobility: Secondary | ICD-10-CM | POA: Diagnosis not present

## 2022-10-26 DIAGNOSIS — M62562 Muscle wasting and atrophy, not elsewhere classified, left lower leg: Secondary | ICD-10-CM | POA: Diagnosis not present

## 2022-10-26 DIAGNOSIS — I6621 Occlusion and stenosis of right posterior cerebral artery: Secondary | ICD-10-CM | POA: Diagnosis not present

## 2022-10-26 DIAGNOSIS — I69391 Dysphagia following cerebral infarction: Secondary | ICD-10-CM | POA: Diagnosis not present

## 2022-10-26 DIAGNOSIS — M62561 Muscle wasting and atrophy, not elsewhere classified, right lower leg: Secondary | ICD-10-CM | POA: Diagnosis not present

## 2022-10-26 DIAGNOSIS — R278 Other lack of coordination: Secondary | ICD-10-CM | POA: Diagnosis not present

## 2022-10-27 DIAGNOSIS — I69322 Dysarthria following cerebral infarction: Secondary | ICD-10-CM | POA: Diagnosis not present

## 2022-10-27 DIAGNOSIS — I6621 Occlusion and stenosis of right posterior cerebral artery: Secondary | ICD-10-CM | POA: Diagnosis not present

## 2022-10-27 DIAGNOSIS — R1312 Dysphagia, oropharyngeal phase: Secondary | ICD-10-CM | POA: Diagnosis not present

## 2022-10-27 DIAGNOSIS — I69391 Dysphagia following cerebral infarction: Secondary | ICD-10-CM | POA: Diagnosis not present

## 2022-10-27 DIAGNOSIS — R278 Other lack of coordination: Secondary | ICD-10-CM | POA: Diagnosis not present

## 2022-10-27 DIAGNOSIS — R2689 Other abnormalities of gait and mobility: Secondary | ICD-10-CM | POA: Diagnosis not present

## 2022-10-27 DIAGNOSIS — M62561 Muscle wasting and atrophy, not elsewhere classified, right lower leg: Secondary | ICD-10-CM | POA: Diagnosis not present

## 2022-10-27 DIAGNOSIS — I69354 Hemiplegia and hemiparesis following cerebral infarction affecting left non-dominant side: Secondary | ICD-10-CM | POA: Diagnosis not present

## 2022-10-27 DIAGNOSIS — I6521 Occlusion and stenosis of right carotid artery: Secondary | ICD-10-CM | POA: Diagnosis not present

## 2022-10-27 DIAGNOSIS — M62562 Muscle wasting and atrophy, not elsewhere classified, left lower leg: Secondary | ICD-10-CM | POA: Diagnosis not present

## 2022-10-31 DIAGNOSIS — R278 Other lack of coordination: Secondary | ICD-10-CM | POA: Diagnosis not present

## 2022-10-31 DIAGNOSIS — I69354 Hemiplegia and hemiparesis following cerebral infarction affecting left non-dominant side: Secondary | ICD-10-CM | POA: Diagnosis not present

## 2022-10-31 DIAGNOSIS — I6621 Occlusion and stenosis of right posterior cerebral artery: Secondary | ICD-10-CM | POA: Diagnosis not present

## 2022-10-31 DIAGNOSIS — R2689 Other abnormalities of gait and mobility: Secondary | ICD-10-CM | POA: Diagnosis not present

## 2022-10-31 DIAGNOSIS — I6521 Occlusion and stenosis of right carotid artery: Secondary | ICD-10-CM | POA: Diagnosis not present

## 2022-10-31 DIAGNOSIS — R1312 Dysphagia, oropharyngeal phase: Secondary | ICD-10-CM | POA: Diagnosis not present

## 2022-10-31 DIAGNOSIS — M62561 Muscle wasting and atrophy, not elsewhere classified, right lower leg: Secondary | ICD-10-CM | POA: Diagnosis not present

## 2022-10-31 DIAGNOSIS — I69322 Dysarthria following cerebral infarction: Secondary | ICD-10-CM | POA: Diagnosis not present

## 2022-10-31 DIAGNOSIS — D649 Anemia, unspecified: Secondary | ICD-10-CM | POA: Diagnosis not present

## 2022-10-31 DIAGNOSIS — M62562 Muscle wasting and atrophy, not elsewhere classified, left lower leg: Secondary | ICD-10-CM | POA: Diagnosis not present

## 2022-10-31 DIAGNOSIS — I69391 Dysphagia following cerebral infarction: Secondary | ICD-10-CM | POA: Diagnosis not present

## 2022-10-31 LAB — COMPREHENSIVE METABOLIC PANEL
Albumin: 3 — AB (ref 3.5–5.0)
Calcium: 8.6 — AB (ref 8.7–10.7)
Globulin: 3.3
eGFR: 47

## 2022-10-31 LAB — CBC AND DIFFERENTIAL
HCT: 25 — AB (ref 36–46)
Hemoglobin: 8.2 — AB (ref 12.0–16.0)
Platelets: 103 10*3/uL — AB (ref 150–400)
WBC: 11.4

## 2022-10-31 LAB — BASIC METABOLIC PANEL
BUN: 38 — AB (ref 4–21)
CO2: 25 — AB (ref 13–22)
Chloride: 99 (ref 99–108)
Creatinine: 1.1 (ref 0.5–1.1)
Glucose: 92
Potassium: 3.3 mEq/L — AB (ref 3.5–5.1)
Sodium: 137 (ref 137–147)

## 2022-10-31 LAB — IRON,TIBC AND FERRITIN PANEL: Iron: 34

## 2022-10-31 LAB — HEPATIC FUNCTION PANEL
ALT: 52 U/L — AB (ref 7–35)
AST: 49 — AB (ref 13–35)
Alkaline Phosphatase: 85 (ref 25–125)
Bilirubin, Total: 0.5

## 2022-10-31 LAB — CBC: RBC: 3.03 — AB (ref 3.87–5.11)

## 2022-11-01 DIAGNOSIS — R2689 Other abnormalities of gait and mobility: Secondary | ICD-10-CM | POA: Diagnosis not present

## 2022-11-01 DIAGNOSIS — M62562 Muscle wasting and atrophy, not elsewhere classified, left lower leg: Secondary | ICD-10-CM | POA: Diagnosis not present

## 2022-11-01 DIAGNOSIS — I6521 Occlusion and stenosis of right carotid artery: Secondary | ICD-10-CM | POA: Diagnosis not present

## 2022-11-01 DIAGNOSIS — R1312 Dysphagia, oropharyngeal phase: Secondary | ICD-10-CM | POA: Diagnosis not present

## 2022-11-01 DIAGNOSIS — R278 Other lack of coordination: Secondary | ICD-10-CM | POA: Diagnosis not present

## 2022-11-01 DIAGNOSIS — I69391 Dysphagia following cerebral infarction: Secondary | ICD-10-CM | POA: Diagnosis not present

## 2022-11-01 DIAGNOSIS — M62561 Muscle wasting and atrophy, not elsewhere classified, right lower leg: Secondary | ICD-10-CM | POA: Diagnosis not present

## 2022-11-01 DIAGNOSIS — I69322 Dysarthria following cerebral infarction: Secondary | ICD-10-CM | POA: Diagnosis not present

## 2022-11-01 DIAGNOSIS — I6621 Occlusion and stenosis of right posterior cerebral artery: Secondary | ICD-10-CM | POA: Diagnosis not present

## 2022-11-01 DIAGNOSIS — I69354 Hemiplegia and hemiparesis following cerebral infarction affecting left non-dominant side: Secondary | ICD-10-CM | POA: Diagnosis not present

## 2022-11-02 ENCOUNTER — Non-Acute Institutional Stay (SKILLED_NURSING_FACILITY): Payer: Medicare Other | Admitting: Adult Health

## 2022-11-02 ENCOUNTER — Encounter: Payer: Self-pay | Admitting: Adult Health

## 2022-11-02 DIAGNOSIS — R1312 Dysphagia, oropharyngeal phase: Secondary | ICD-10-CM | POA: Diagnosis not present

## 2022-11-02 DIAGNOSIS — R7989 Other specified abnormal findings of blood chemistry: Secondary | ICD-10-CM

## 2022-11-02 DIAGNOSIS — M62561 Muscle wasting and atrophy, not elsewhere classified, right lower leg: Secondary | ICD-10-CM | POA: Diagnosis not present

## 2022-11-02 DIAGNOSIS — I69354 Hemiplegia and hemiparesis following cerebral infarction affecting left non-dominant side: Secondary | ICD-10-CM | POA: Diagnosis not present

## 2022-11-02 DIAGNOSIS — R278 Other lack of coordination: Secondary | ICD-10-CM | POA: Diagnosis not present

## 2022-11-02 DIAGNOSIS — D509 Iron deficiency anemia, unspecified: Secondary | ICD-10-CM

## 2022-11-02 DIAGNOSIS — I6521 Occlusion and stenosis of right carotid artery: Secondary | ICD-10-CM | POA: Diagnosis not present

## 2022-11-02 DIAGNOSIS — D696 Thrombocytopenia, unspecified: Secondary | ICD-10-CM | POA: Diagnosis not present

## 2022-11-02 DIAGNOSIS — S90821A Blister (nonthermal), right foot, initial encounter: Secondary | ICD-10-CM

## 2022-11-02 DIAGNOSIS — I6621 Occlusion and stenosis of right posterior cerebral artery: Secondary | ICD-10-CM | POA: Diagnosis not present

## 2022-11-02 DIAGNOSIS — I69391 Dysphagia following cerebral infarction: Secondary | ICD-10-CM | POA: Diagnosis not present

## 2022-11-02 DIAGNOSIS — M62562 Muscle wasting and atrophy, not elsewhere classified, left lower leg: Secondary | ICD-10-CM | POA: Diagnosis not present

## 2022-11-02 DIAGNOSIS — S91103A Unspecified open wound of unspecified great toe without damage to nail, initial encounter: Secondary | ICD-10-CM

## 2022-11-02 DIAGNOSIS — R2689 Other abnormalities of gait and mobility: Secondary | ICD-10-CM | POA: Diagnosis not present

## 2022-11-02 DIAGNOSIS — I69322 Dysarthria following cerebral infarction: Secondary | ICD-10-CM | POA: Diagnosis not present

## 2022-11-02 MED ORDER — DOXYCYCLINE HYCLATE 100 MG PO TABS: 100.0000 mg | ORAL_TABLET | Freq: Two times a day (BID) | ORAL | 0 refills | Status: AC

## 2022-11-02 MED ORDER — FERROUS SULFATE 300 (60 FE) MG/5ML PO SOLN: 300.0000 mg | ORAL | 3 refills | Status: DC

## 2022-11-02 NOTE — Progress Notes (Signed)
Location:  [facility] Room Number: 118A Place of Service:  SNF [phone]) Provider:  Royal Hawthorn, NP  [name], MD  Patient Care Team: [name], MD as PCP - General (Internal Medicine) [name], MD as PCP - Electrophysiology (Cardiology) [name], MD as Consulting Physician (Dermatology) [name], MD as Consulting Physician (Cardiology) [facility] [name], MD as Consulting Physician (Dermatology)  Extended Emergency Contact Information Primary Emergency Contact: [name] of [place] Phone: [phone] Mobile Phone: [phone] Relation: Daughter Secondary Emergency Contact: Terrilyn Saver of [place] Phone: [phone] Mobile Phone: [phone] Relation: Son  Code Status:  DNR Goals of care: Advanced Directive information    11/02/2022    9:51 AM  Advanced Directives  Does Patient Have a Medical Advance Directive? Yes  Type of Paramedic of [place];Out of facility DNR (pink MOST or yellow form);Living will  Does patient want to make changes to medical advance directive? No - Patient declined  Copy of [place] in Chart? Yes - validated most recent copy scanned in chart (See row information)  Pre-existing out of facility DNR order (yellow form or pink MOST form) Pink MOST/Yellow Form most recent copy in chart - Physician notified to receive inpatient order     Chief Complaint  Patient presents with   Acute Visit    Patient is being seen for an acute Leg wound   Quality Metric Gaps    Discussed the need for Awv and tetanus vaccine     HPI:  Pt is a 88 y.o. 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   Discharged from the hospital back to [facility] 10/02/22 to skilled care diagnosed with a right large PCA infarct s/p TNK . She presented with dysarthria, left facial droop and right gaze preference. These symptoms continue. Speech is more clear. Still has dysphagia not progressing.   Resident seen 12/14 for bruising area to right great toe and medial aspect of the head of the metatarsal. Concern for pressure at the time and also easy bruising as pt has significant bruising to BUE and BLE. Bed cradle, heel boots and pressure relief recommended. Skin prep to heel also recommended due to redness.   Requested to see the resident 11/02/2022 due to worsening. The right heel now has a red blister with redness and a small wound that is open at the achilles area. Also right great toe has an open area and also the medial aspect of the metatarsal head. These areas have some pain and tenderness. No fever or purulence. Seems to have progressed over the past 6 days.   10/31/22: Hgb 8.2 MCV 82.9 Ferritin 491 Iron 34 WBC improved 11.4  P.O. intake is less than adequate. Taking magic cup. Weight down 2 lbs in the past month. Oral fluid intake can be low at times with low urine output, but can also with encouragement she can drink more.   10/31/22 LFTs ALT 52 AST 49  trending down  K 3.3 given 20 meq of kdur  Past Medical History:  Diagnosis Date   Actinic keratosis 05/25/2014   Anxiety    Atrial fibrillation ([place]) 09/21/2010   Basal cell  carcinoma 05/25/2014   Multiple removed by Dr. Danella Sensing in March 2015: right neck, left neck, scalp    Bell's palsy 07/18/1979   Cervicalgia 01/31/2012   Closed fracture of lumbar vertebra without mention of spinal cord injury 07/17/2005   Conjunctiva disorder 12/26/2010   Coronary atherosclerosis of native coronary artery 07/17/1997   Cramp of limb 08/16/2009   Degeneration of lumbar or lumbosacral intervertebral disc  01/31/2012   Disturbance of skin sensation 08/2009   Dizziness and giddiness 11/08/2011   vertigo   External hemorrhoids without mention of complication 16/08/9603   External hemorrhoids without mention of complication 54/07/8118   Ganglion of tendon sheath 08/16/2009   Leg cramp 10/27/2013   Most frequently the right leg.    Long term (current) use of anticoagulants 09/2010   Lumbago 07/2009   Meralgia paresthetica 07/2007   MI, old    Myalgia and myositis, unspecified 01/17/2012   Osteoporosis    Other abnormal blood chemistry 04/08/2012   Other abnormal blood chemistry 2013   hyperglycemia   Other disorder of muscle, ligament, and fascia 04/08/2012   Other specified cardiac dysrhythmias(427.89) 06/27/2010   Pacemaker 05/06/2020   Pain in joint, ankle and foot 10/27/2013   Bilateral since 1998    Pain in joint, shoulder region 08/12/2012   Pain in joint, upper arm 12/26/2010   Pain in limb 01/11/2011   Pain in thoracic spine 01/31/2012   Pathologic fracture of vertebrae 01/22/2012   PVC's (premature ventricular contractions)    Rash and other nonspecific skin eruption 08/02/2011   Senile osteoporosis 07/17/1993   Stroke ([place])    Unspecified essential hypertension 07/17/1997   Varicose veins of lower extremities 08/16/2009   Varicose veins of lower extremities 08/16/2009   Past Surgical History:  Procedure Laterality Date   COLONOSCOPY WITH PROPOFOL N/A 10/19/2015   Procedure: COLONOSCOPY WITH PROPOFOL;  Surgeon: Gatha Mayer, MD;  Location: [facility];  Service: Endoscopy;  Laterality: N/A;   CORONARY ARTERY BYPASS GRAFT  1998   x2; LIMA to LAD; SVG to diagonal off bypass   HEMORRHOID SURGERY  08/26/2012   Dr. Brantley Stage   LOOP RECORDER INSERTION N/A 05/13/2018   Procedure: LOOP RECORDER INSERTION;  Surgeon: [name], MD;  Location: [facility];  Service: Cardiovascular;  Laterality: N/A;   LOOP RECORDER REMOVAL N/A 05/26/2019   Procedure: LOOP RECORDER  REMOVAL;  Surgeon: [name], MD;  Location: [facility];  Service: Cardiovascular;  Laterality: N/A;   MASS EXCISION Left 11/23/2015   Procedure: LEFT WRIST EXCISION CYST;  Surgeon: Leanora Cover, MD;  Location: [facility];  Service: Orthopedics;  Laterality: Left;   PACEMAKER IMPLANT N/A 05/26/2019   Procedure: PACEMAKER IMPLANT;  Surgeon: [name], MD;  Location: [facility];  Service: Cardiovascular;  Laterality: N/A;   SKIN BIOPSY  01/29/14   (R) neck; (R) scalp, 2 (L) neck; shave biopsy Superficial basal cell carcinoma Dr. Danella Sensing   TONSILLECTOMY  [phone]    Allergies  Allergen Reactions   Iodinated Contrast Media Nausea Only and Other (See Comments)    Severe nausea and also passed out   Iodine Nausea Only and Other (See Comments)    "Allergic," per [facility]- PASSED OUT   Lodine [Etodolac] Other (See Comments)    "Allergic," per [facility]   Other Other (See Comments)    Seasonal Allergies   Bactrim Rash   Relafen [Nabumetone] Rash   Sulfamethoxazole-Trimethoprim Rash  In matrix   Sulfanilamide Rash    Outpatient Encounter Medications as of 11/02/2022  Medication Sig   acetaminophen (TYLENOL) 325 MG tablet Take 650 mg by mouth 3 (three) times daily as needed (for pain).   amLODipine (NORVASC) 5 MG tablet Take 5 mg by mouth daily.   Artificial Saliva (BIOTENE DRY MOUTH MOISTURIZING) SOLN Use as directed 1-2 sprays in the mouth or throat 3 (three) times daily.   aspirin 81 MG chewable tablet Chew 1 tablet (81 mg total) by mouth daily.   atenolol (TENORMIN) 50 MG tablet Take 1 tablet (50 mg total) by mouth 2 (two) times daily.   atorvastatin (LIPITOR) 40 MG tablet Take 1 tablet (40 mg total) by mouth daily.   Calcium Carb-Cholecalciferol (CALCIUM 500 + D3 PO) Take 1 capsule by mouth in the morning.   diclofenac Sodium (VOLTAREN) 1 % GEL Apply 2 g topically as needed (BID PRN).   digoxin (LANOXIN) 0.125 MG tablet Take 1 tablet (0.125 mg total) by  mouth every other day.   docusate (COLACE) 50 MG/5ML liquid Take 100 mg by mouth daily.   hydrALAZINE (APRESOLINE) 10 MG tablet Take 10 mg by mouth 3 (three) times daily as needed (SBP>180).   hydrochlorothiazide (HYDRODIURIL) 12.5 MG tablet Take 1 tablet (12.5 mg total) by mouth daily.   levocetirizine (XYZAL) 5 MG tablet Take 5 mg by mouth at bedtime.   lidocaine (LIDODERM) 5 % Place 1 patch onto the skin as needed (for back pain- Remove & Discard patch within 12 hours or as directed by MD).   LORazepam (ATIVAN) 1 MG tablet Take 1 tablet (1 mg total) by mouth at bedtime. (Patient taking differently: Take 1 mg by mouth at bedtime as needed.)   losartan (COZAAR) 25 MG tablet Take 50 mg by mouth daily.   polyethylene glycol (MIRALAX / GLYCOLAX) 17 g packet Take 17 g by mouth See admin instructions. Mix 17 grams of powder into 4-6 oz of beverage of choice and drink once a day   potassium chloride SA (KLOR-CON) 20 MEQ tablet Take 10 mEq by mouth every evening.   No facility-administered encounter medications on file as of 11/02/2022.    Review of Systems  Constitutional:  Positive for activity change (since CVA) and appetite change. Negative for chills, diaphoresis, fatigue, fever and unexpected weight change.  HENT:  Positive for trouble swallowing. Negative for congestion.   Respiratory:  Positive for cough (dry). Negative for shortness of breath and wheezing.   Cardiovascular:  Negative for chest pain, palpitations and leg swelling.  Gastrointestinal:  Negative for abdominal distention, abdominal pain, constipation and diarrhea.  Genitourinary:  Negative for difficulty urinating and dysuria.  Musculoskeletal:  Positive for gait problem. Negative for arthralgias, back pain, joint swelling and myalgias.       Foot hurts right  Skin:  Positive for color change and wound.  Neurological:  Positive for facial asymmetry, speech difficulty and weakness. Negative for dizziness, tremors, seizures,  syncope, light-headedness, numbness and headaches.  Psychiatric/Behavioral:  Positive for confusion. Negative for agitation and behavioral problems.     Immunization History  Administered Date(s) Administered   Influenza Whole 08/06/2012   Influenza, High Dose Seasonal PF 08/15/2019, 08/27/2020   Influenza,inj,Quad PF,6+ Mos 08/30/2018   Influenza-Unspecified 08/06/2013, 08/24/2014, 08/26/2015, 08/31/2016, 08/27/2017, 08/27/2020   Moderna Sars-Covid-2 Vaccination 11/16/2019, 12/16/2019, 09/16/2020   Pneumococcal Conjugate-13 09/22/2015   Pneumococcal Polysaccharide-23 08/29/2006   Td 10/07/2007   Zoster Recombinat (Shingrix) 02/12/2018, 05/13/2018   Zoster, Live 03/12/2008  Pertinent  Health Maintenance Due  Topic Date Due   INFLUENZA VACCINE  02/04/2023 (Originally 06/06/2022)   DEXA SCAN  Completed      10/01/2022    8:00 PM 10/02/2022    8:00 AM 10/03/2022   10:49 AM 10/04/2022    9:50 AM 10/06/2022   10:31 AM  Fall Risk  Falls in the past year?   '1 1 1  '$ Was there an injury with Fall?   '1 1 1  '$ Fall Risk Category Calculator   '3 3 2  '$ Fall Risk Category   High High Moderate  Patient Fall Risk Level High fall risk High fall risk High fall risk High fall risk High fall risk  Patient at Risk for Falls Due to   History of fall(s) History of fall(s) History of fall(s)  Fall risk Follow up   Falls evaluation completed Falls evaluation completed Falls evaluation completed   Functional Status Survey:    Vitals:   11/02/22 0945  BP: (!) 144/72  Pulse: 72  Resp: 17  Temp: 97.9 F (36.6 C)  TempSrc: Temporal  SpO2: 95%  Weight: 130 lb (59 kg)  Height: '5\' 6"'$  (1.676 m)   Body mass index is 20.98 kg/m. Physical Exam Vitals and nursing note reviewed.  Constitutional:      General: She is not in acute distress.    Appearance: She is not diaphoretic.  HENT:     Head: Normocephalic and atraumatic.     Mouth/Throat:     Mouth: Mucous membranes are moist.     Pharynx:  Oropharynx is clear.  Neck:     Vascular: No JVD.  Cardiovascular:     Rate and Rhythm: Normal rate. Rhythm irregular.     Heart sounds: No murmur heard.    Comments: Very faint dorsalis pedal pulse palpable bilateral.  Not able to palpate posterior tibial pulse.  Pulmonary:     Effort: Pulmonary effort is normal. No respiratory distress.     Breath sounds: Rhonchi (clears with cough) present. No wheezing.  Abdominal:     General: Bowel sounds are normal. There is no distension.     Palpations: Abdomen is soft.     Tenderness: There is no abdominal tenderness.  Musculoskeletal:     Right lower leg: No edema.     Left lower leg: No edema.  Skin:    General: Skin is warm and dry.     Findings: Bruising (BUE and BLE) present.     Comments: Right heel with fluid filled blister, surrounding erythema.  Right achilles with small open circle 100% pink tissue. No drainage, mild erythema Right Great toe and medial aspect of metatarsal head with open area 100% pink tissue, mild surrounding erythema. No purulent drainage Right thigh with large patch of erythema, no open areas.   Neurological:     Mental Status: She is alert.     Comments: Left flaccid hemiplegia. Left droop. Slow speech but more clear      Labs reviewed: Recent Labs    09/30/22 0931 10/01/22 0806 10/02/22 0900 10/21/22 0000  NA 141 142 142 135*  K 3.6 3.3* 3.9 3.9  CL 108 111 116* 96*  CO2 22 20* 19* 26*  GLUCOSE 107* 114* 108*  --   BUN 34* 36* 35* 28*  CREATININE 1.25* 1.21* 1.09* 1.1  CALCIUM 8.5* 8.2* 8.2* 9.2   Recent Labs    08/01/22 0000 09/26/22 1539 10/21/22 0000  AST 18 17 62*  ALT 8  12 69*  ALKPHOS 86 71 110  BILITOT  --  0.3  --   PROT  --  7.1  --   ALBUMIN 3.8 3.1* 3.2*   Recent Labs    09/30/22 0931 10/01/22 0806 10/02/22 0900 10/09/22 0000 10/12/22 0000 10/16/22 0000 10/20/22 0000  WBC 10.7* 11.0* 9.9 18.8 17.7 15.1 15.2  NEUTROABS  --   --   --  11.10 11.60 9.40  --   HGB 9.2*  8.5* 8.8* 9.3* 9.7* 8.6* 9.0*  HCT 28.9* 25.7* 27.2* 29* 30* 27* 28*  MCV 88.1 87.1 86.6  --   --   --   --   PLT 96* 87* 109* 179 210 217 187   Lab Results  Component Value Date   TSH 2.87 02/03/2022   Lab Results  Component Value Date   HGBA1C 5.7 (H) 09/26/2022   Lab Results  Component Value Date   CHOL 161 09/27/2022   HDL 39 (L) 09/27/2022   LDLCALC 114 (H) 09/27/2022   LDLDIRECT 81.3 08/19/2020   TRIG 39 09/27/2022   CHOLHDL 4.1 09/27/2022    Significant Diagnostic Results in last 30 days:  No results found.  Assessment/Plan  1. Open wound of great toe, initial encounter Due to redness of the wounds on the toe and heel, along with a patch of erythema on the thigh will add doxycycline.  Also obtain arterial doppler stat. If worsening send to ED unless goals of care change to comfort based. They were originally comfort based but now are focused on recovery which as been slow.  Polymem dressing per nursing staff.  - doxycycline (VIBRA-TABS) 100 MG tablet; Take 1 tablet (100 mg total) by mouth 2 (two) times daily for 10 days.  Dispense: 20 tablet; Refill: 0  2. Blister of right heel, initial encounter Nursing staff to elevated feet, use pressure relief boots, bed cradle.   3. Iron deficiency anemia, unspecified iron deficiency anemia type She can not swallow iron tablets and they are not crushable. Will try liquid iron which will need to be thickened Repeat CBC and iron in 2 weeks.  - ferrous sulfate 300 (60 Fe) MG/5ML syrup; Take 5 mLs (300 mg total) by mouth 3 (three) times a week.  Dispense: 150 mL; Refill: 3  4. Thrombocytopenia (HCC) Chronic mild  5. Elevated LFTs Improving.   Family/ staff Communication: [name] daughter   Labs/tests ordered:  arterial ultrasound  CBC Iron panel 2 weeks.

## 2022-11-03 DIAGNOSIS — L97811 Non-pressure chronic ulcer of other part of right lower leg limited to breakdown of skin: Secondary | ICD-10-CM | POA: Diagnosis not present

## 2022-11-03 DIAGNOSIS — I69354 Hemiplegia and hemiparesis following cerebral infarction affecting left non-dominant side: Secondary | ICD-10-CM | POA: Diagnosis not present

## 2022-11-03 DIAGNOSIS — L97821 Non-pressure chronic ulcer of other part of left lower leg limited to breakdown of skin: Secondary | ICD-10-CM | POA: Diagnosis not present

## 2022-11-03 DIAGNOSIS — I69322 Dysarthria following cerebral infarction: Secondary | ICD-10-CM | POA: Diagnosis not present

## 2022-11-03 DIAGNOSIS — I70248 Atherosclerosis of native arteries of left leg with ulceration of other part of lower left leg: Secondary | ICD-10-CM | POA: Diagnosis not present

## 2022-11-03 DIAGNOSIS — I6621 Occlusion and stenosis of right posterior cerebral artery: Secondary | ICD-10-CM | POA: Diagnosis not present

## 2022-11-03 DIAGNOSIS — I6521 Occlusion and stenosis of right carotid artery: Secondary | ICD-10-CM | POA: Diagnosis not present

## 2022-11-03 DIAGNOSIS — I70238 Atherosclerosis of native arteries of right leg with ulceration of other part of lower right leg: Secondary | ICD-10-CM | POA: Diagnosis not present

## 2022-11-03 DIAGNOSIS — I69391 Dysphagia following cerebral infarction: Secondary | ICD-10-CM | POA: Diagnosis not present

## 2022-11-03 DIAGNOSIS — R1312 Dysphagia, oropharyngeal phase: Secondary | ICD-10-CM | POA: Diagnosis not present

## 2022-11-03 DIAGNOSIS — R230 Cyanosis: Secondary | ICD-10-CM | POA: Diagnosis not present

## 2022-11-06 DIAGNOSIS — R278 Other lack of coordination: Secondary | ICD-10-CM | POA: Diagnosis not present

## 2022-11-06 DIAGNOSIS — M62561 Muscle wasting and atrophy, not elsewhere classified, right lower leg: Secondary | ICD-10-CM | POA: Diagnosis not present

## 2022-11-06 DIAGNOSIS — M62562 Muscle wasting and atrophy, not elsewhere classified, left lower leg: Secondary | ICD-10-CM | POA: Diagnosis not present

## 2022-11-06 DIAGNOSIS — R2689 Other abnormalities of gait and mobility: Secondary | ICD-10-CM | POA: Diagnosis not present

## 2022-11-06 DIAGNOSIS — I6621 Occlusion and stenosis of right posterior cerebral artery: Secondary | ICD-10-CM | POA: Diagnosis not present

## 2022-11-06 DIAGNOSIS — R1312 Dysphagia, oropharyngeal phase: Secondary | ICD-10-CM | POA: Diagnosis not present

## 2022-11-06 DIAGNOSIS — I6521 Occlusion and stenosis of right carotid artery: Secondary | ICD-10-CM | POA: Diagnosis not present

## 2022-11-06 DIAGNOSIS — I69391 Dysphagia following cerebral infarction: Secondary | ICD-10-CM | POA: Diagnosis not present

## 2022-11-06 DIAGNOSIS — M6389 Disorders of muscle in diseases classified elsewhere, multiple sites: Secondary | ICD-10-CM | POA: Diagnosis not present

## 2022-11-06 DIAGNOSIS — I69322 Dysarthria following cerebral infarction: Secondary | ICD-10-CM | POA: Diagnosis not present

## 2022-11-06 DIAGNOSIS — I69354 Hemiplegia and hemiparesis following cerebral infarction affecting left non-dominant side: Secondary | ICD-10-CM | POA: Diagnosis not present

## 2022-11-07 DIAGNOSIS — M6389 Disorders of muscle in diseases classified elsewhere, multiple sites: Secondary | ICD-10-CM | POA: Diagnosis not present

## 2022-11-07 DIAGNOSIS — I69322 Dysarthria following cerebral infarction: Secondary | ICD-10-CM | POA: Diagnosis not present

## 2022-11-07 DIAGNOSIS — I6621 Occlusion and stenosis of right posterior cerebral artery: Secondary | ICD-10-CM | POA: Diagnosis not present

## 2022-11-07 DIAGNOSIS — R2689 Other abnormalities of gait and mobility: Secondary | ICD-10-CM | POA: Diagnosis not present

## 2022-11-07 DIAGNOSIS — I69354 Hemiplegia and hemiparesis following cerebral infarction affecting left non-dominant side: Secondary | ICD-10-CM | POA: Diagnosis not present

## 2022-11-07 DIAGNOSIS — I6521 Occlusion and stenosis of right carotid artery: Secondary | ICD-10-CM | POA: Diagnosis not present

## 2022-11-08 DIAGNOSIS — I69322 Dysarthria following cerebral infarction: Secondary | ICD-10-CM | POA: Diagnosis not present

## 2022-11-08 DIAGNOSIS — I69354 Hemiplegia and hemiparesis following cerebral infarction affecting left non-dominant side: Secondary | ICD-10-CM | POA: Diagnosis not present

## 2022-11-08 DIAGNOSIS — M6389 Disorders of muscle in diseases classified elsewhere, multiple sites: Secondary | ICD-10-CM | POA: Diagnosis not present

## 2022-11-08 DIAGNOSIS — I6621 Occlusion and stenosis of right posterior cerebral artery: Secondary | ICD-10-CM | POA: Diagnosis not present

## 2022-11-08 DIAGNOSIS — I6521 Occlusion and stenosis of right carotid artery: Secondary | ICD-10-CM | POA: Diagnosis not present

## 2022-11-08 DIAGNOSIS — R2689 Other abnormalities of gait and mobility: Secondary | ICD-10-CM | POA: Diagnosis not present

## 2022-11-09 DIAGNOSIS — I69322 Dysarthria following cerebral infarction: Secondary | ICD-10-CM | POA: Diagnosis not present

## 2022-11-09 DIAGNOSIS — M6389 Disorders of muscle in diseases classified elsewhere, multiple sites: Secondary | ICD-10-CM | POA: Diagnosis not present

## 2022-11-09 DIAGNOSIS — I6521 Occlusion and stenosis of right carotid artery: Secondary | ICD-10-CM | POA: Diagnosis not present

## 2022-11-09 DIAGNOSIS — R2689 Other abnormalities of gait and mobility: Secondary | ICD-10-CM | POA: Diagnosis not present

## 2022-11-09 DIAGNOSIS — I6621 Occlusion and stenosis of right posterior cerebral artery: Secondary | ICD-10-CM | POA: Diagnosis not present

## 2022-11-09 DIAGNOSIS — I69354 Hemiplegia and hemiparesis following cerebral infarction affecting left non-dominant side: Secondary | ICD-10-CM | POA: Diagnosis not present

## 2022-11-10 DIAGNOSIS — I69322 Dysarthria following cerebral infarction: Secondary | ICD-10-CM | POA: Diagnosis not present

## 2022-11-10 DIAGNOSIS — I6621 Occlusion and stenosis of right posterior cerebral artery: Secondary | ICD-10-CM | POA: Diagnosis not present

## 2022-11-10 DIAGNOSIS — M6389 Disorders of muscle in diseases classified elsewhere, multiple sites: Secondary | ICD-10-CM | POA: Diagnosis not present

## 2022-11-10 DIAGNOSIS — I6521 Occlusion and stenosis of right carotid artery: Secondary | ICD-10-CM | POA: Diagnosis not present

## 2022-11-10 DIAGNOSIS — I69354 Hemiplegia and hemiparesis following cerebral infarction affecting left non-dominant side: Secondary | ICD-10-CM | POA: Diagnosis not present

## 2022-11-10 DIAGNOSIS — R2689 Other abnormalities of gait and mobility: Secondary | ICD-10-CM | POA: Diagnosis not present

## 2022-11-13 DIAGNOSIS — R2689 Other abnormalities of gait and mobility: Secondary | ICD-10-CM | POA: Diagnosis not present

## 2022-11-13 DIAGNOSIS — I69322 Dysarthria following cerebral infarction: Secondary | ICD-10-CM | POA: Diagnosis not present

## 2022-11-13 DIAGNOSIS — I6521 Occlusion and stenosis of right carotid artery: Secondary | ICD-10-CM | POA: Diagnosis not present

## 2022-11-13 DIAGNOSIS — I69354 Hemiplegia and hemiparesis following cerebral infarction affecting left non-dominant side: Secondary | ICD-10-CM | POA: Diagnosis not present

## 2022-11-13 DIAGNOSIS — M6389 Disorders of muscle in diseases classified elsewhere, multiple sites: Secondary | ICD-10-CM | POA: Diagnosis not present

## 2022-11-13 DIAGNOSIS — I6621 Occlusion and stenosis of right posterior cerebral artery: Secondary | ICD-10-CM | POA: Diagnosis not present

## 2022-11-14 DIAGNOSIS — I69322 Dysarthria following cerebral infarction: Secondary | ICD-10-CM | POA: Diagnosis not present

## 2022-11-14 DIAGNOSIS — I6621 Occlusion and stenosis of right posterior cerebral artery: Secondary | ICD-10-CM | POA: Diagnosis not present

## 2022-11-14 DIAGNOSIS — R2689 Other abnormalities of gait and mobility: Secondary | ICD-10-CM | POA: Diagnosis not present

## 2022-11-14 DIAGNOSIS — M6389 Disorders of muscle in diseases classified elsewhere, multiple sites: Secondary | ICD-10-CM | POA: Diagnosis not present

## 2022-11-14 DIAGNOSIS — I69354 Hemiplegia and hemiparesis following cerebral infarction affecting left non-dominant side: Secondary | ICD-10-CM | POA: Diagnosis not present

## 2022-11-14 DIAGNOSIS — I6521 Occlusion and stenosis of right carotid artery: Secondary | ICD-10-CM | POA: Diagnosis not present

## 2022-11-15 DIAGNOSIS — I6521 Occlusion and stenosis of right carotid artery: Secondary | ICD-10-CM | POA: Diagnosis not present

## 2022-11-15 DIAGNOSIS — I6621 Occlusion and stenosis of right posterior cerebral artery: Secondary | ICD-10-CM | POA: Diagnosis not present

## 2022-11-15 DIAGNOSIS — I69354 Hemiplegia and hemiparesis following cerebral infarction affecting left non-dominant side: Secondary | ICD-10-CM | POA: Diagnosis not present

## 2022-11-15 DIAGNOSIS — I69322 Dysarthria following cerebral infarction: Secondary | ICD-10-CM | POA: Diagnosis not present

## 2022-11-15 DIAGNOSIS — M6389 Disorders of muscle in diseases classified elsewhere, multiple sites: Secondary | ICD-10-CM | POA: Diagnosis not present

## 2022-11-15 DIAGNOSIS — R2689 Other abnormalities of gait and mobility: Secondary | ICD-10-CM | POA: Diagnosis not present

## 2022-11-16 DIAGNOSIS — D649 Anemia, unspecified: Secondary | ICD-10-CM | POA: Diagnosis not present

## 2022-11-16 DIAGNOSIS — R2689 Other abnormalities of gait and mobility: Secondary | ICD-10-CM | POA: Diagnosis not present

## 2022-11-16 DIAGNOSIS — I69322 Dysarthria following cerebral infarction: Secondary | ICD-10-CM | POA: Diagnosis not present

## 2022-11-16 DIAGNOSIS — I6521 Occlusion and stenosis of right carotid artery: Secondary | ICD-10-CM | POA: Diagnosis not present

## 2022-11-16 DIAGNOSIS — Z79899 Other long term (current) drug therapy: Secondary | ICD-10-CM | POA: Diagnosis not present

## 2022-11-16 DIAGNOSIS — I69354 Hemiplegia and hemiparesis following cerebral infarction affecting left non-dominant side: Secondary | ICD-10-CM | POA: Diagnosis not present

## 2022-11-16 DIAGNOSIS — M6389 Disorders of muscle in diseases classified elsewhere, multiple sites: Secondary | ICD-10-CM | POA: Diagnosis not present

## 2022-11-16 DIAGNOSIS — I6621 Occlusion and stenosis of right posterior cerebral artery: Secondary | ICD-10-CM | POA: Diagnosis not present

## 2022-11-16 LAB — CBC AND DIFFERENTIAL
HCT: 27 — AB (ref 36–46)
Hemoglobin: 8.8 — AB (ref 12.0–16.0)
Neutrophils Absolute: 7.2
Platelets: 181 10*3/uL (ref 150–400)
WBC: 13.1

## 2022-11-16 LAB — IRON,TIBC AND FERRITIN PANEL
Iron: 26
TIBC: 225
UIBC: 199

## 2022-11-16 LAB — CBC: RBC: 3.21 — AB (ref 3.87–5.11)

## 2022-11-17 DIAGNOSIS — R2689 Other abnormalities of gait and mobility: Secondary | ICD-10-CM | POA: Diagnosis not present

## 2022-11-17 DIAGNOSIS — I6621 Occlusion and stenosis of right posterior cerebral artery: Secondary | ICD-10-CM | POA: Diagnosis not present

## 2022-11-17 DIAGNOSIS — I6521 Occlusion and stenosis of right carotid artery: Secondary | ICD-10-CM | POA: Diagnosis not present

## 2022-11-17 DIAGNOSIS — M6389 Disorders of muscle in diseases classified elsewhere, multiple sites: Secondary | ICD-10-CM | POA: Diagnosis not present

## 2022-11-17 DIAGNOSIS — I69322 Dysarthria following cerebral infarction: Secondary | ICD-10-CM | POA: Diagnosis not present

## 2022-11-17 DIAGNOSIS — I69354 Hemiplegia and hemiparesis following cerebral infarction affecting left non-dominant side: Secondary | ICD-10-CM | POA: Diagnosis not present

## 2022-11-18 DIAGNOSIS — I6621 Occlusion and stenosis of right posterior cerebral artery: Secondary | ICD-10-CM | POA: Diagnosis not present

## 2022-11-18 DIAGNOSIS — I69354 Hemiplegia and hemiparesis following cerebral infarction affecting left non-dominant side: Secondary | ICD-10-CM | POA: Diagnosis not present

## 2022-11-18 DIAGNOSIS — I6521 Occlusion and stenosis of right carotid artery: Secondary | ICD-10-CM | POA: Diagnosis not present

## 2022-11-18 DIAGNOSIS — I69322 Dysarthria following cerebral infarction: Secondary | ICD-10-CM | POA: Diagnosis not present

## 2022-11-18 DIAGNOSIS — M6389 Disorders of muscle in diseases classified elsewhere, multiple sites: Secondary | ICD-10-CM | POA: Diagnosis not present

## 2022-11-18 DIAGNOSIS — R2689 Other abnormalities of gait and mobility: Secondary | ICD-10-CM | POA: Diagnosis not present

## 2022-11-20 DIAGNOSIS — R2689 Other abnormalities of gait and mobility: Secondary | ICD-10-CM | POA: Diagnosis not present

## 2022-11-20 DIAGNOSIS — I6621 Occlusion and stenosis of right posterior cerebral artery: Secondary | ICD-10-CM | POA: Diagnosis not present

## 2022-11-20 DIAGNOSIS — M6389 Disorders of muscle in diseases classified elsewhere, multiple sites: Secondary | ICD-10-CM | POA: Diagnosis not present

## 2022-11-20 DIAGNOSIS — I6521 Occlusion and stenosis of right carotid artery: Secondary | ICD-10-CM | POA: Diagnosis not present

## 2022-11-20 DIAGNOSIS — I69322 Dysarthria following cerebral infarction: Secondary | ICD-10-CM | POA: Diagnosis not present

## 2022-11-20 DIAGNOSIS — I69354 Hemiplegia and hemiparesis following cerebral infarction affecting left non-dominant side: Secondary | ICD-10-CM | POA: Diagnosis not present

## 2022-11-21 DIAGNOSIS — M2041 Other hammer toe(s) (acquired), right foot: Secondary | ICD-10-CM | POA: Diagnosis not present

## 2022-11-21 DIAGNOSIS — S90421A Blister (nonthermal), right great toe, initial encounter: Secondary | ICD-10-CM | POA: Diagnosis not present

## 2022-11-21 DIAGNOSIS — M79672 Pain in left foot: Secondary | ICD-10-CM | POA: Diagnosis not present

## 2022-11-21 DIAGNOSIS — M2042 Other hammer toe(s) (acquired), left foot: Secondary | ICD-10-CM | POA: Diagnosis not present

## 2022-11-21 DIAGNOSIS — M79671 Pain in right foot: Secondary | ICD-10-CM | POA: Diagnosis not present

## 2022-11-21 DIAGNOSIS — I6621 Occlusion and stenosis of right posterior cerebral artery: Secondary | ICD-10-CM | POA: Diagnosis not present

## 2022-11-21 DIAGNOSIS — I6521 Occlusion and stenosis of right carotid artery: Secondary | ICD-10-CM | POA: Diagnosis not present

## 2022-11-21 DIAGNOSIS — R2689 Other abnormalities of gait and mobility: Secondary | ICD-10-CM | POA: Diagnosis not present

## 2022-11-21 DIAGNOSIS — L602 Onychogryphosis: Secondary | ICD-10-CM | POA: Diagnosis not present

## 2022-11-21 DIAGNOSIS — M6389 Disorders of muscle in diseases classified elsewhere, multiple sites: Secondary | ICD-10-CM | POA: Diagnosis not present

## 2022-11-21 DIAGNOSIS — I69354 Hemiplegia and hemiparesis following cerebral infarction affecting left non-dominant side: Secondary | ICD-10-CM | POA: Diagnosis not present

## 2022-11-21 DIAGNOSIS — I69322 Dysarthria following cerebral infarction: Secondary | ICD-10-CM | POA: Diagnosis not present

## 2022-11-22 DIAGNOSIS — I6621 Occlusion and stenosis of right posterior cerebral artery: Secondary | ICD-10-CM | POA: Diagnosis not present

## 2022-11-22 DIAGNOSIS — R2689 Other abnormalities of gait and mobility: Secondary | ICD-10-CM | POA: Diagnosis not present

## 2022-11-22 DIAGNOSIS — I6521 Occlusion and stenosis of right carotid artery: Secondary | ICD-10-CM | POA: Diagnosis not present

## 2022-11-22 DIAGNOSIS — I69322 Dysarthria following cerebral infarction: Secondary | ICD-10-CM | POA: Diagnosis not present

## 2022-11-22 DIAGNOSIS — I69354 Hemiplegia and hemiparesis following cerebral infarction affecting left non-dominant side: Secondary | ICD-10-CM | POA: Diagnosis not present

## 2022-11-22 DIAGNOSIS — M6389 Disorders of muscle in diseases classified elsewhere, multiple sites: Secondary | ICD-10-CM | POA: Diagnosis not present

## 2022-11-23 DIAGNOSIS — M6389 Disorders of muscle in diseases classified elsewhere, multiple sites: Secondary | ICD-10-CM | POA: Diagnosis not present

## 2022-11-23 DIAGNOSIS — I69322 Dysarthria following cerebral infarction: Secondary | ICD-10-CM | POA: Diagnosis not present

## 2022-11-23 DIAGNOSIS — I6521 Occlusion and stenosis of right carotid artery: Secondary | ICD-10-CM | POA: Diagnosis not present

## 2022-11-23 DIAGNOSIS — R2689 Other abnormalities of gait and mobility: Secondary | ICD-10-CM | POA: Diagnosis not present

## 2022-11-23 DIAGNOSIS — I69354 Hemiplegia and hemiparesis following cerebral infarction affecting left non-dominant side: Secondary | ICD-10-CM | POA: Diagnosis not present

## 2022-11-23 DIAGNOSIS — I6621 Occlusion and stenosis of right posterior cerebral artery: Secondary | ICD-10-CM | POA: Diagnosis not present

## 2022-11-24 DIAGNOSIS — M6389 Disorders of muscle in diseases classified elsewhere, multiple sites: Secondary | ICD-10-CM | POA: Diagnosis not present

## 2022-11-24 DIAGNOSIS — I69322 Dysarthria following cerebral infarction: Secondary | ICD-10-CM | POA: Diagnosis not present

## 2022-11-24 DIAGNOSIS — I6521 Occlusion and stenosis of right carotid artery: Secondary | ICD-10-CM | POA: Diagnosis not present

## 2022-11-24 DIAGNOSIS — R2689 Other abnormalities of gait and mobility: Secondary | ICD-10-CM | POA: Diagnosis not present

## 2022-11-24 DIAGNOSIS — I69354 Hemiplegia and hemiparesis following cerebral infarction affecting left non-dominant side: Secondary | ICD-10-CM | POA: Diagnosis not present

## 2022-11-24 DIAGNOSIS — I6621 Occlusion and stenosis of right posterior cerebral artery: Secondary | ICD-10-CM | POA: Diagnosis not present

## 2022-11-27 ENCOUNTER — Inpatient Hospital Stay: Payer: Medicare Other | Admitting: Neurology

## 2022-11-27 DIAGNOSIS — I6521 Occlusion and stenosis of right carotid artery: Secondary | ICD-10-CM | POA: Diagnosis not present

## 2022-11-27 DIAGNOSIS — I69354 Hemiplegia and hemiparesis following cerebral infarction affecting left non-dominant side: Secondary | ICD-10-CM | POA: Diagnosis not present

## 2022-11-27 DIAGNOSIS — M6389 Disorders of muscle in diseases classified elsewhere, multiple sites: Secondary | ICD-10-CM | POA: Diagnosis not present

## 2022-11-27 DIAGNOSIS — I6621 Occlusion and stenosis of right posterior cerebral artery: Secondary | ICD-10-CM | POA: Diagnosis not present

## 2022-11-27 DIAGNOSIS — I69322 Dysarthria following cerebral infarction: Secondary | ICD-10-CM | POA: Diagnosis not present

## 2022-11-27 DIAGNOSIS — R2689 Other abnormalities of gait and mobility: Secondary | ICD-10-CM | POA: Diagnosis not present

## 2022-11-28 ENCOUNTER — Ambulatory Visit: Payer: Medicare Other | Admitting: Podiatry

## 2022-11-28 DIAGNOSIS — M6389 Disorders of muscle in diseases classified elsewhere, multiple sites: Secondary | ICD-10-CM | POA: Diagnosis not present

## 2022-11-28 DIAGNOSIS — I69322 Dysarthria following cerebral infarction: Secondary | ICD-10-CM | POA: Diagnosis not present

## 2022-11-28 DIAGNOSIS — I6621 Occlusion and stenosis of right posterior cerebral artery: Secondary | ICD-10-CM | POA: Diagnosis not present

## 2022-11-28 DIAGNOSIS — I69354 Hemiplegia and hemiparesis following cerebral infarction affecting left non-dominant side: Secondary | ICD-10-CM | POA: Diagnosis not present

## 2022-11-28 DIAGNOSIS — I6521 Occlusion and stenosis of right carotid artery: Secondary | ICD-10-CM | POA: Diagnosis not present

## 2022-11-28 DIAGNOSIS — R2689 Other abnormalities of gait and mobility: Secondary | ICD-10-CM | POA: Diagnosis not present

## 2022-11-29 DIAGNOSIS — M6389 Disorders of muscle in diseases classified elsewhere, multiple sites: Secondary | ICD-10-CM | POA: Diagnosis not present

## 2022-11-29 DIAGNOSIS — R2689 Other abnormalities of gait and mobility: Secondary | ICD-10-CM | POA: Diagnosis not present

## 2022-11-29 DIAGNOSIS — I6621 Occlusion and stenosis of right posterior cerebral artery: Secondary | ICD-10-CM | POA: Diagnosis not present

## 2022-11-29 DIAGNOSIS — I69322 Dysarthria following cerebral infarction: Secondary | ICD-10-CM | POA: Diagnosis not present

## 2022-11-29 DIAGNOSIS — I69354 Hemiplegia and hemiparesis following cerebral infarction affecting left non-dominant side: Secondary | ICD-10-CM | POA: Diagnosis not present

## 2022-11-29 DIAGNOSIS — I6521 Occlusion and stenosis of right carotid artery: Secondary | ICD-10-CM | POA: Diagnosis not present

## 2022-11-30 DIAGNOSIS — R2689 Other abnormalities of gait and mobility: Secondary | ICD-10-CM | POA: Diagnosis not present

## 2022-11-30 DIAGNOSIS — I69322 Dysarthria following cerebral infarction: Secondary | ICD-10-CM | POA: Diagnosis not present

## 2022-11-30 DIAGNOSIS — I6621 Occlusion and stenosis of right posterior cerebral artery: Secondary | ICD-10-CM | POA: Diagnosis not present

## 2022-11-30 DIAGNOSIS — I6521 Occlusion and stenosis of right carotid artery: Secondary | ICD-10-CM | POA: Diagnosis not present

## 2022-11-30 DIAGNOSIS — I69354 Hemiplegia and hemiparesis following cerebral infarction affecting left non-dominant side: Secondary | ICD-10-CM | POA: Diagnosis not present

## 2022-11-30 DIAGNOSIS — M6389 Disorders of muscle in diseases classified elsewhere, multiple sites: Secondary | ICD-10-CM | POA: Diagnosis not present

## 2022-12-01 ENCOUNTER — Encounter: Payer: Self-pay | Admitting: Adult Health

## 2022-12-01 ENCOUNTER — Ambulatory Visit: Payer: Medicare Other | Attending: Internal Medicine

## 2022-12-01 ENCOUNTER — Non-Acute Institutional Stay (SKILLED_NURSING_FACILITY): Payer: Medicare Other | Admitting: Adult Health

## 2022-12-01 DIAGNOSIS — I10 Essential (primary) hypertension: Secondary | ICD-10-CM | POA: Diagnosis not present

## 2022-12-01 DIAGNOSIS — I6621 Occlusion and stenosis of right posterior cerebral artery: Secondary | ICD-10-CM | POA: Diagnosis not present

## 2022-12-01 DIAGNOSIS — R29818 Other symptoms and signs involving the nervous system: Secondary | ICD-10-CM

## 2022-12-01 DIAGNOSIS — I472 Ventricular tachycardia, unspecified: Secondary | ICD-10-CM

## 2022-12-01 DIAGNOSIS — R1312 Dysphagia, oropharyngeal phase: Secondary | ICD-10-CM | POA: Diagnosis not present

## 2022-12-01 DIAGNOSIS — I69354 Hemiplegia and hemiparesis following cerebral infarction affecting left non-dominant side: Secondary | ICD-10-CM | POA: Diagnosis not present

## 2022-12-01 DIAGNOSIS — I69322 Dysarthria following cerebral infarction: Secondary | ICD-10-CM | POA: Diagnosis not present

## 2022-12-01 DIAGNOSIS — S91309A Unspecified open wound, unspecified foot, initial encounter: Secondary | ICD-10-CM | POA: Diagnosis not present

## 2022-12-01 DIAGNOSIS — I442 Atrioventricular block, complete: Secondary | ICD-10-CM

## 2022-12-01 DIAGNOSIS — I639 Cerebral infarction, unspecified: Secondary | ICD-10-CM | POA: Diagnosis not present

## 2022-12-01 DIAGNOSIS — I739 Peripheral vascular disease, unspecified: Secondary | ICD-10-CM

## 2022-12-01 DIAGNOSIS — I4821 Permanent atrial fibrillation: Secondary | ICD-10-CM | POA: Diagnosis not present

## 2022-12-01 DIAGNOSIS — D509 Iron deficiency anemia, unspecified: Secondary | ICD-10-CM | POA: Diagnosis not present

## 2022-12-01 DIAGNOSIS — R2689 Other abnormalities of gait and mobility: Secondary | ICD-10-CM | POA: Diagnosis not present

## 2022-12-01 DIAGNOSIS — M6389 Disorders of muscle in diseases classified elsewhere, multiple sites: Secondary | ICD-10-CM | POA: Diagnosis not present

## 2022-12-01 DIAGNOSIS — I6521 Occlusion and stenosis of right carotid artery: Secondary | ICD-10-CM | POA: Diagnosis not present

## 2022-12-01 LAB — CUP PACEART REMOTE DEVICE CHECK
Battery Remaining Longevity: 106 mo
Battery Remaining Percentage: 79 %
Battery Voltage: 3.01 V
Brady Statistic RV Percent Paced: 8.6 %
Date Time Interrogation Session: 20240126020014
Implantable Lead Connection Status: 753985
Implantable Lead Implant Date: 20200720
Implantable Lead Location: 753860
Implantable Pulse Generator Implant Date: 20200720
Lead Channel Impedance Value: 490 Ohm
Lead Channel Pacing Threshold Amplitude: 1 V
Lead Channel Pacing Threshold Pulse Width: 0.5 ms
Lead Channel Sensing Intrinsic Amplitude: 5.4 mV
Lead Channel Setting Pacing Amplitude: 2.5 V
Lead Channel Setting Pacing Pulse Width: 0.5 ms
Lead Channel Setting Sensing Sensitivity: 0.5 mV
Pulse Gen Model: 1272
Pulse Gen Serial Number: 9122450

## 2022-12-01 NOTE — Progress Notes (Unsigned)
Location:  [facility] Room Number: 11A Place of Service:  SNF [phone]) Provider:  Royal Hawthorn NP  [name], MD  Patient Care Team: [name], MD as PCP - General (Internal Medicine) [name], MD as PCP - Electrophysiology (Cardiology) [name], MD as Consulting Physician (Dermatology) [name], MD as Consulting Physician (Cardiology) [facility] [name], MD as Consulting Physician (Dermatology)  Extended Emergency Contact Information Primary Emergency Contact: [name] of [place] Phone: [phone] Mobile Phone: [phone] Relation: Daughter Secondary Emergency Contact: Terrilyn Saver of [place] Phone: [phone] Mobile Phone: [phone] Relation: Son  Code Status:  DNR Goals of care: Advanced Directive information    12/01/2022    9:28 AM  Advanced Directives  Does Patient Have a Medical Advance Directive? Yes  Type of Paramedic of [place];Out of facility DNR (pink MOST or yellow form);Living will  Does patient want to make changes to medical advance directive? No - Patient declined  Copy of [place] in Chart? Yes - validated most recent copy scanned in chart (See row information)  Pre-existing out of facility DNR order (yellow form or pink MOST form) Pink MOST/Yellow Form most recent copy in chart - Physician notified to receive inpatient order     Chief Complaint  Patient presents with   Medical Management of Chronic Issues    Patient is being seen for a Routine visit   Quality Metric Gaps    Dscussed the need for AWV and Tdap    HPI:  Pt is a 89 y.o. female seen today for medical management of chronic diseases.    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   Discharged from the hospital back to [facility] 10/02/22 to skilled care diagnosed with a right large PCA infarct s/p TNK . She presented with dysarthria, left facial droop and right gaze preference.  She continues with a left sided neglect and hemiplegia, dysarthria,weakness, and dysphagia with very little progress.  She can be very sleepy and difficult to arouse.  Continues on speech therapy and D1 diet with HTL. Has iron deficiency anemia but iron was discontinued due to pt/family preference. (No aggressive work up due to frail health) Pacemaker with remote transmission done 1/26 Afib rate is controlled. Only on asa due to bleeding with bakers cysts to knees and nose bleeds.  BP is better controlled Has lost 5 lbs since the CVA. Takes magic cup, intake rather low.  Albumin 3.0  Past Medical History:  Diagnosis Date   Actinic keratosis 05/25/2014   Anxiety    Atrial fibrillation ([place]) 09/21/2010   Basal cell carcinoma 05/25/2014   Multiple removed by Dr. Danella Sensing in March 2015: right neck, left neck, scalp    Bell's palsy 07/18/1979   Cervicalgia 01/31/2012   Closed fracture of lumbar vertebra without mention of spinal cord injury 07/17/2005   Conjunctiva disorder 12/26/2010   Coronary atherosclerosis of native coronary artery 07/17/1997   Cramp of limb 08/16/2009   Degeneration of lumbar or lumbosacral intervertebral disc 01/31/2012   Disturbance of skin sensation 08/2009   Dizziness and giddiness 11/08/2011   vertigo   External hemorrhoids without mention of complication 67/10/4579   External hemorrhoids without mention of complication 99/83/3825   Ganglion of tendon sheath  08/16/2009   Leg cramp 10/27/2013   Most frequently the right leg.    Long term (current) use of anticoagulants 09/2010   Lumbago 07/2009   Meralgia paresthetica 07/2007   MI, old    Myalgia and myositis, unspecified 01/17/2012   Osteoporosis    Other abnormal blood  chemistry 04/08/2012   Other abnormal blood chemistry 2013   hyperglycemia   Other disorder of muscle, ligament, and fascia 04/08/2012   Other specified cardiac dysrhythmias(427.89) 06/27/2010   Pacemaker 05/06/2020   Pain in joint, ankle and foot 10/27/2013   Bilateral since 1998    Pain in joint, shoulder region 08/12/2012   Pain in joint, upper arm 12/26/2010   Pain in limb 01/11/2011   Pain in thoracic spine 01/31/2012   Pathologic fracture of vertebrae 01/22/2012   PVC's (premature ventricular contractions)    Rash and other nonspecific skin eruption 08/02/2011   Senile osteoporosis 07/17/1993   Stroke ([place])    Unspecified essential hypertension 07/17/1997   Varicose veins of lower extremities 08/16/2009   Varicose veins of lower extremities 08/16/2009   Past Surgical History:  Procedure Laterality Date   COLONOSCOPY WITH PROPOFOL N/A 10/19/2015   Procedure: COLONOSCOPY WITH PROPOFOL;  Surgeon: Gatha Mayer, MD;  Location: [facility];  Service: Endoscopy;  Laterality: N/A;   CORONARY ARTERY BYPASS GRAFT  1998   x2; LIMA to LAD; SVG to diagonal off bypass   HEMORRHOID SURGERY  08/26/2012   Dr. Brantley Stage   LOOP RECORDER INSERTION N/A 05/13/2018   Procedure: LOOP RECORDER INSERTION;  Surgeon: [name], MD;  Location: [facility];  Service: Cardiovascular;  Laterality: N/A;   LOOP RECORDER REMOVAL N/A 05/26/2019   Procedure: LOOP RECORDER REMOVAL;  Surgeon: [name], MD;  Location: [facility];  Service: Cardiovascular;  Laterality: N/A;   MASS EXCISION Left 11/23/2015   Procedure: LEFT WRIST EXCISION CYST;  Surgeon: Leanora Cover, MD;  Location: [facility];  Service: Orthopedics;  Laterality: Left;   PACEMAKER IMPLANT N/A 05/26/2019   Procedure: PACEMAKER IMPLANT;  Surgeon: [name], MD;  Location: [facility];  Service: Cardiovascular;  Laterality: N/A;   SKIN BIOPSY  01/29/14   (R) neck; (R) scalp, 2 (L) neck; shave biopsy  Superficial basal cell carcinoma Dr. Danella Sensing   TONSILLECTOMY  [phone]    Allergies  Allergen Reactions   Iodinated Contrast Media Nausea Only and Other (See Comments)    Severe nausea and also passed out   Iodine Nausea Only and Other (See Comments)    "Allergic," per [facility]- PASSED OUT   Lodine [Etodolac] Other (See Comments)    "Allergic," per [facility]   Other Other (See Comments)    Seasonal Allergies   Bactrim Rash   Relafen [Nabumetone] Rash   Sulfamethoxazole-Trimethoprim Rash    In matrix   Sulfanilamide Rash    Outpatient Encounter Medications as of 12/01/2022  Medication Sig   acetaminophen (TYLENOL) 325 MG tablet Take 650 mg by mouth 3 (three) times daily as needed (for pain).   amLODipine (NORVASC) 5 MG tablet Take 5 mg by mouth daily.   Artificial Saliva (BIOTENE DRY MOUTH MOISTURIZING) SOLN Use as directed 1-2 sprays in the mouth or throat 3 (three) times daily.   aspirin 81 MG chewable tablet Chew 1 tablet (81 mg total) by mouth daily.   atenolol (TENORMIN) 50 MG tablet Take 1 tablet (50 mg total) by mouth 2 (two) times daily.   atorvastatin (  LIPITOR) 40 MG tablet Take 1 tablet (40 mg total) by mouth daily.   Calcium Carb-Cholecalciferol (CALCIUM 500 + D3 PO) Take 1 capsule by mouth in the morning.   digoxin (LANOXIN) 0.125 MG tablet Take 1 tablet (0.125 mg total) by mouth every other day.   docusate (COLACE) 50 MG/5ML liquid Take 100 mg by mouth daily.   hydrALAZINE (APRESOLINE) 10 MG tablet Take 10 mg by mouth 3 (three) times daily as needed (SBP>180).   hydrochlorothiazide (HYDRODIURIL) 12.5 MG tablet Take 1 tablet (12.5 mg total) by mouth daily.   levocetirizine (XYZAL) 5 MG tablet Take 5 mg by mouth at bedtime.   losartan (COZAAR) 25 MG tablet Take 50 mg by mouth daily.   polyethylene glycol (MIRALAX / GLYCOLAX) 17 g packet Take 17 g by mouth See admin instructions. Mix 17 grams of powder into 4-6 oz of beverage of choice and drink once a day   potassium chloride SA  (KLOR-CON) 20 MEQ tablet Take 10 mEq by mouth every evening.   diclofenac Sodium (VOLTAREN) 1 % GEL Apply 2 g topically as needed (BID PRN). (Patient not taking: Reported on 12/01/2022)   ferrous sulfate 300 (60 Fe) MG/5ML syrup Take 5 mLs (300 mg total) by mouth 3 (three) times a week.   lidocaine (LIDODERM) 5 % Place 1 patch onto the skin as needed (for back pain- Remove & Discard patch within 12 hours or as directed by MD). (Patient not taking: Reported on 12/01/2022)   LORazepam (ATIVAN) 1 MG tablet Take 1 tablet (1 mg total) by mouth at bedtime. (Patient not taking: Reported on 12/01/2022)   No facility-administered encounter medications on file as of 12/01/2022.    Review of Systems  Unable to perform ROS: Other    Immunization History  Administered Date(s) Administered   Influenza Whole 08/06/2012   Influenza, High Dose Seasonal PF 08/15/2019, 08/27/2020   Influenza,inj,Quad PF,6+ Mos 08/30/2018   Influenza-Unspecified 08/06/2013, 08/24/2014, 08/26/2015, 08/31/2016, 08/27/2017, 08/27/2020   Moderna Sars-Covid-2 Vaccination 11/16/2019, 12/16/2019, 09/16/2020   Pneumococcal Conjugate-13 09/22/2015   Pneumococcal Polysaccharide-23 08/29/2006   Td 10/07/2007   Zoster Recombinat (Shingrix) 02/12/2018, 05/13/2018   Zoster, Live 03/12/2008   Pertinent  Health Maintenance Due  Topic Date Due   INFLUENZA VACCINE  02/04/2023 (Originally 06/06/2022)   DEXA SCAN  Completed      10/02/2022    8:00 AM 10/03/2022   10:49 AM 10/04/2022    9:50 AM 10/06/2022   10:31 AM 12/01/2022    9:28 AM  Fall Risk  Falls in the past year?  '1 1 1 1  '$ Was there an injury with Fall?  '1 1 1 1  '$ Fall Risk Category Calculator  '3 3 2 2  '$ Fall Risk Category (Retired)  High High Moderate   (RETIRED) Patient Fall Risk Level High fall risk High fall risk High fall risk High fall risk   Patient at Risk for Falls Due to  History of fall(s) History of fall(s) History of fall(s) History of fall(s)  Fall risk Follow up   Falls evaluation completed Falls evaluation completed Falls evaluation completed Falls evaluation completed   Functional Status Survey:    Vitals:   12/01/22 0913  BP: 117/70  Pulse: 81  Resp: 18  Temp: (!) 97.3 F (36.3 C)  TempSrc: Temporal  SpO2: 100%  Weight: 125 lb 9.6 oz (57 kg)  Height: '5\' 6"'$  (1.676 m)   Body mass index is 20.27 kg/m. Physical Exam Vitals and nursing note reviewed.  Constitutional:      General: She is not in acute distress.    Appearance: She is not diaphoretic.  HENT:     Head: Normocephalic and atraumatic.     Nose: Nose normal.     Mouth/Throat:     Mouth: Mucous membranes are dry.     Pharynx: Oropharynx is clear.  Neck:     Vascular: No JVD.  Cardiovascular:     Rate and Rhythm: Normal rate. Rhythm irregular.     Heart sounds: No murmur heard. Pulmonary:     Effort: Pulmonary effort is normal. No respiratory distress.     Breath sounds: Normal breath sounds. No wheezing.  Abdominal:     General: Bowel sounds are normal. There is no distension.     Palpations: Abdomen is soft.     Tenderness: There is no abdominal tenderness.  Musculoskeletal:     Right lower leg: No edema.     Left lower leg: No edema.  Skin:    General: Skin is warm and dry.     Coloration: Skin is pale.     Comments: Right great toe wound healed with eschar. Right heel wound healed with eschar.  Right medial metacarpal wound 100% pink tissue, healing with mild surrounding erythema Left later malleolus wound healing 100% pink tissue, no drainage mild erythema.   Neurological:     Mental Status: She is alert.     Comments: Left hemiplegia dysarthria     Labs reviewed: Recent Labs    09/30/22 0931 10/01/22 0806 10/02/22 0900 10/21/22 0000 10/31/22 0000  NA 141 142 142 135* 137  K 3.6 3.3* 3.9 3.9 3.3*  CL 108 111 116* 96* 99  CO2 22 20* 19* 26* 25*  GLUCOSE 107* 114* 108*  --   --   BUN 34* 36* 35* 28* 38*  CREATININE 1.25* 1.21* 1.09* 1.1 1.1   CALCIUM 8.5* 8.2* 8.2* 9.2 8.6*   Recent Labs    09/26/22 1539 10/21/22 0000 10/31/22 0000  AST 17 62* 49*  ALT 12 69* 52*  ALKPHOS 71 110 85  BILITOT 0.3  --   --   PROT 7.1  --   --   ALBUMIN 3.1* 3.2* 3.0*   Recent Labs    09/30/22 0931 10/01/22 0806 10/02/22 0900 10/09/22 0000 10/12/22 0000 10/16/22 0000 10/20/22 0000 10/31/22 0000  WBC 10.7* 11.0* 9.9 18.8 17.7 15.1 15.2 11.4  NEUTROABS  --   --   --  11.10 11.60 9.40  --   --   HGB 9.2* 8.5* 8.8* 9.3* 9.7* 8.6* 9.0* 8.2*  HCT 28.9* 25.7* 27.2* 29* 30* 27* 28* 25*  MCV 88.1 87.1 86.6  --   --   --   --   --   PLT 96* 87* 109* 179 210 217 187 103*   Lab Results  Component Value Date   TSH 2.87 02/03/2022   Lab Results  Component Value Date   HGBA1C 5.7 (H) 09/26/2022   Lab Results  Component Value Date   CHOL 161 09/27/2022   HDL 39 (L) 09/27/2022   LDLCALC 114 (H) 09/27/2022   LDLDIRECT 81.3 08/19/2020   TRIG 39 09/27/2022   CHOLHDL 4.1 09/27/2022    Significant Diagnostic Results in last 30 days:  No results found.  Assessment/Plan  1. Multiple open wounds of foot Noted to right great toe, right medial metatarsal, right heel, and left lateral malleous.  Healing Contributors pressure, poor nutrition, PAD Continue dressing changes,  wounds are much improved Pressure relief with boots and bed cradle.    2. Neurologic deficit due to acute ischemic cerebrovascular accident (CVA) ([place]) Left sided neglect Dysphagia Dysarthria Continues to work with therapy   3. Oropharyngeal dysphagia Working with speech therapy with severe impairments.  Continues with D1 diet HTL  4. Essential hypertension Controlled.   5. 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 Permanent atrial fibrillation ([place]) Only on asa due to prior bleeding issues.  Rate is controlled.  Followed by cardiology   7. PAD (peripheral artery disease) (H[facility]) ABI 0.7 BLE Need report  Mild to moderate disease.  On  asa and statin   8. Iron deficiency anemia, unspecified iron deficiency anemia type Lab Results  Component Value Date   HGB 8.2 (A) 10/31/2022   Lab Results  Component Value Date   IRON 34 10/31/2022    Not able to swallow iron capsules. Did not like oral liquid.  Will repeat CBC   9. Elevated LFTs Slightly elevated LFTs, trending down Will repeat   Family/ staff Communication: discussed with nurse Misti  Labs/tests ordered:  CBC CMP next week

## 2022-12-02 ENCOUNTER — Encounter: Payer: Self-pay | Admitting: Adult Health

## 2022-12-02 DIAGNOSIS — I739 Peripheral vascular disease, unspecified: Secondary | ICD-10-CM | POA: Insufficient documentation

## 2022-12-04 DIAGNOSIS — M6389 Disorders of muscle in diseases classified elsewhere, multiple sites: Secondary | ICD-10-CM | POA: Diagnosis not present

## 2022-12-04 DIAGNOSIS — I6521 Occlusion and stenosis of right carotid artery: Secondary | ICD-10-CM | POA: Diagnosis not present

## 2022-12-04 DIAGNOSIS — H5310 Unspecified subjective visual disturbances: Secondary | ICD-10-CM | POA: Diagnosis not present

## 2022-12-04 DIAGNOSIS — I69354 Hemiplegia and hemiparesis following cerebral infarction affecting left non-dominant side: Secondary | ICD-10-CM | POA: Diagnosis not present

## 2022-12-04 DIAGNOSIS — I6621 Occlusion and stenosis of right posterior cerebral artery: Secondary | ICD-10-CM | POA: Diagnosis not present

## 2022-12-04 DIAGNOSIS — R2689 Other abnormalities of gait and mobility: Secondary | ICD-10-CM | POA: Diagnosis not present

## 2022-12-04 DIAGNOSIS — I69322 Dysarthria following cerebral infarction: Secondary | ICD-10-CM | POA: Diagnosis not present

## 2022-12-05 DIAGNOSIS — R2689 Other abnormalities of gait and mobility: Secondary | ICD-10-CM | POA: Diagnosis not present

## 2022-12-05 DIAGNOSIS — I69322 Dysarthria following cerebral infarction: Secondary | ICD-10-CM | POA: Diagnosis not present

## 2022-12-05 DIAGNOSIS — M6389 Disorders of muscle in diseases classified elsewhere, multiple sites: Secondary | ICD-10-CM | POA: Diagnosis not present

## 2022-12-05 DIAGNOSIS — I69354 Hemiplegia and hemiparesis following cerebral infarction affecting left non-dominant side: Secondary | ICD-10-CM | POA: Diagnosis not present

## 2022-12-05 DIAGNOSIS — I6521 Occlusion and stenosis of right carotid artery: Secondary | ICD-10-CM | POA: Diagnosis not present

## 2022-12-05 DIAGNOSIS — I6621 Occlusion and stenosis of right posterior cerebral artery: Secondary | ICD-10-CM | POA: Diagnosis not present

## 2022-12-06 DIAGNOSIS — I69322 Dysarthria following cerebral infarction: Secondary | ICD-10-CM | POA: Diagnosis not present

## 2022-12-06 DIAGNOSIS — I6521 Occlusion and stenosis of right carotid artery: Secondary | ICD-10-CM | POA: Diagnosis not present

## 2022-12-06 DIAGNOSIS — R2689 Other abnormalities of gait and mobility: Secondary | ICD-10-CM | POA: Diagnosis not present

## 2022-12-06 DIAGNOSIS — M6389 Disorders of muscle in diseases classified elsewhere, multiple sites: Secondary | ICD-10-CM | POA: Diagnosis not present

## 2022-12-06 DIAGNOSIS — I6621 Occlusion and stenosis of right posterior cerebral artery: Secondary | ICD-10-CM | POA: Diagnosis not present

## 2022-12-06 DIAGNOSIS — I69354 Hemiplegia and hemiparesis following cerebral infarction affecting left non-dominant side: Secondary | ICD-10-CM | POA: Diagnosis not present

## 2022-12-07 DIAGNOSIS — Z79899 Other long term (current) drug therapy: Secondary | ICD-10-CM | POA: Diagnosis not present

## 2022-12-07 DIAGNOSIS — M6389 Disorders of muscle in diseases classified elsewhere, multiple sites: Secondary | ICD-10-CM | POA: Diagnosis not present

## 2022-12-07 DIAGNOSIS — I69354 Hemiplegia and hemiparesis following cerebral infarction affecting left non-dominant side: Secondary | ICD-10-CM | POA: Diagnosis not present

## 2022-12-07 DIAGNOSIS — I69322 Dysarthria following cerebral infarction: Secondary | ICD-10-CM | POA: Diagnosis not present

## 2022-12-07 DIAGNOSIS — I69391 Dysphagia following cerebral infarction: Secondary | ICD-10-CM | POA: Diagnosis not present

## 2022-12-07 DIAGNOSIS — R1312 Dysphagia, oropharyngeal phase: Secondary | ICD-10-CM | POA: Diagnosis not present

## 2022-12-07 DIAGNOSIS — D649 Anemia, unspecified: Secondary | ICD-10-CM | POA: Diagnosis not present

## 2022-12-07 DIAGNOSIS — R278 Other lack of coordination: Secondary | ICD-10-CM | POA: Diagnosis not present

## 2022-12-07 DIAGNOSIS — I158 Other secondary hypertension: Secondary | ICD-10-CM | POA: Diagnosis not present

## 2022-12-07 DIAGNOSIS — I6621 Occlusion and stenosis of right posterior cerebral artery: Secondary | ICD-10-CM | POA: Diagnosis not present

## 2022-12-07 DIAGNOSIS — I6521 Occlusion and stenosis of right carotid artery: Secondary | ICD-10-CM | POA: Diagnosis not present

## 2022-12-07 LAB — COMPREHENSIVE METABOLIC PANEL
Albumin: 3.1 — AB (ref 3.5–5.0)
Calcium: 8.9 (ref 8.7–10.7)
Globulin: 3.7
eGFR: 38

## 2022-12-07 LAB — BASIC METABOLIC PANEL
BUN: 82 — AB (ref 4–21)
CO2: 21 (ref 13–22)
Chloride: 104 (ref 99–108)
Creatinine: 1.3 — AB (ref 0.5–1.1)
Glucose: 104
Potassium: 4 mEq/L (ref 3.5–5.1)
Sodium: 139 (ref 137–147)

## 2022-12-07 LAB — HEPATIC FUNCTION PANEL
ALT: 70 U/L — AB (ref 7–35)
AST: 58 — AB (ref 13–35)
Alkaline Phosphatase: 99 (ref 25–125)
Bilirubin, Total: 0.4

## 2022-12-08 ENCOUNTER — Non-Acute Institutional Stay (SKILLED_NURSING_FACILITY): Payer: Medicare Other | Admitting: Adult Health

## 2022-12-08 ENCOUNTER — Telehealth: Payer: Self-pay

## 2022-12-08 ENCOUNTER — Encounter: Payer: Self-pay | Admitting: Adult Health

## 2022-12-08 DIAGNOSIS — N289 Disorder of kidney and ureter, unspecified: Secondary | ICD-10-CM | POA: Diagnosis not present

## 2022-12-08 DIAGNOSIS — I69322 Dysarthria following cerebral infarction: Secondary | ICD-10-CM | POA: Diagnosis not present

## 2022-12-08 DIAGNOSIS — R278 Other lack of coordination: Secondary | ICD-10-CM | POA: Diagnosis not present

## 2022-12-08 DIAGNOSIS — R29818 Other symptoms and signs involving the nervous system: Secondary | ICD-10-CM | POA: Diagnosis not present

## 2022-12-08 DIAGNOSIS — M6389 Disorders of muscle in diseases classified elsewhere, multiple sites: Secondary | ICD-10-CM | POA: Diagnosis not present

## 2022-12-08 DIAGNOSIS — R1312 Dysphagia, oropharyngeal phase: Secondary | ICD-10-CM | POA: Diagnosis not present

## 2022-12-08 DIAGNOSIS — E860 Dehydration: Secondary | ICD-10-CM

## 2022-12-08 DIAGNOSIS — I639 Cerebral infarction, unspecified: Secondary | ICD-10-CM | POA: Diagnosis not present

## 2022-12-08 DIAGNOSIS — R7989 Other specified abnormal findings of blood chemistry: Secondary | ICD-10-CM

## 2022-12-08 DIAGNOSIS — I69354 Hemiplegia and hemiparesis following cerebral infarction affecting left non-dominant side: Secondary | ICD-10-CM | POA: Diagnosis not present

## 2022-12-08 DIAGNOSIS — Z79899 Other long term (current) drug therapy: Secondary | ICD-10-CM | POA: Diagnosis not present

## 2022-12-08 DIAGNOSIS — I10 Essential (primary) hypertension: Secondary | ICD-10-CM | POA: Diagnosis not present

## 2022-12-08 DIAGNOSIS — I6621 Occlusion and stenosis of right posterior cerebral artery: Secondary | ICD-10-CM | POA: Diagnosis not present

## 2022-12-08 DIAGNOSIS — I6521 Occlusion and stenosis of right carotid artery: Secondary | ICD-10-CM | POA: Diagnosis not present

## 2022-12-08 LAB — CBC AND DIFFERENTIAL
HCT: 25 — AB (ref 36–46)
Hemoglobin: 8.3 — AB (ref 12.0–16.0)
Platelets: 187 10*3/uL (ref 150–400)
WBC: 10.4

## 2022-12-08 LAB — CBC: RBC: 2.99 — AB (ref 3.87–5.11)

## 2022-12-08 NOTE — Progress Notes (Signed)
Location:  Occupational psychologist of Service:  SNF (31) Provider:   Cindi Carbon, [place] [phone]   [name], MD   Extended Emergency Contact Information Primary Emergency Contact: [name] of [place] Phone: [phone] Mobile Phone: [phone] Relation: Daughter Secondary Emergency Contact: [name],[name]  [name] of [place] Phone: [phone] Mobile Phone: [phone] Relation: Son  Code Status:  DNR Goals of care: Advanced Directive information    12/01/2022    9:28 AM  Advanced Directives  Does Patient Have a Medical Advance Directive? Yes  Type of Paramedic of [place];Out of facility DNR (pink MOST or yellow form);Living will  Does patient want to make changes to medical advance directive? No - Patient declined  Copy of [place] in Chart? Yes - validated most recent copy scanned in chart (See row information)  Pre-existing out of facility DNR order (yellow form or pink MOST form) Pink MOST/Yellow Form most recent copy in chart - Physician notified to receive inpatient order     Chief Complaint  Patient presents with   Acute Visit    Elevated BUN    HPI:  Pt is a 89 y.o. female seen today for an acute visit for elevated BUN  She was in the hospital and discharged on 10/02/22 to skilled care diagnosed with a right large PCA infarct s/p TNK . She presented with dysarthria, left facial droop and right gaze preference.  She continues with symptoms of left sided neglect, weakness, dysphagia.  Has a hx of prior CVA as well along with afib, pacemaker, HTN among others.   Routine labs drawn 12/07/22: Glucose 104 Ca 8.9 BUN 82.4 Cr 1.32 Na 139 K 4.0 Cl 104 C02 21 T bil 0.4 Alb 3.1 ALT 70 AST 58 Alk phos 99 osmolality 302  HCTZ/Kdur discontinued 2/1  LFTS have been slightly elevated since Dec. She is on a statin started after the CVA. Has  tylenol ordered prn but rarely uses.   Nurse reports she is sleeping more and eats small amts, very slowly Continues to work with speech therapy with minimal gains in dysphagia. Remains on puree diet and HTL. Has throat clearing and wet vocal quality with liquids.   She is able to open her eyes follow commands and answer questions at times with lucidity but has other times of deep sleep/lethargy and difficult to arouse  She is taking magic cup three times daily, this am very little consumed.   Weight is trending down 9 lbs down since hospitalization for CVA in Nov 2023.   Past Medical History:  Diagnosis Date   Actinic keratosis 05/25/2014   Anxiety    Atrial fibrillation ([place]) 09/21/2010   Basal cell carcinoma 05/25/2014   Multiple removed by Dr. Danella Sensing in March 2015: right neck, left neck, scalp    Bell's palsy 07/18/1979   Cervicalgia 01/31/2012   Closed fracture of lumbar vertebra without mention of spinal cord injury 07/17/2005   Conjunctiva disorder 12/26/2010   Coronary atherosclerosis of native coronary artery 07/17/1997   Cramp of limb 08/16/2009   Degeneration of lumbar or lumbosacral intervertebral disc 01/31/2012   Disturbance of skin sensation 08/2009   Dizziness and giddiness 11/08/2011   vertigo   External hemorrhoids without mention of complication 02/72/5366   External hemorrhoids without mention of complication 44/01/4741   Ganglion of tendon sheath 08/16/2009   Leg cramp 10/27/2013   Most frequently the right leg.  Long term (current) use of anticoagulants 09/2010   Lumbago 07/2009   Meralgia paresthetica 07/2007   MI, old    Myalgia and myositis, unspecified 01/17/2012   Osteoporosis    Other abnormal blood chemistry 04/08/2012   Other abnormal blood chemistry 2013   hyperglycemia   Other disorder of muscle, ligament, and fascia 04/08/2012   Other specified cardiac dysrhythmias(427.89) 06/27/2010   Pacemaker 05/06/2020   Pain in joint, ankle  and foot 10/27/2013   Bilateral since 1998    Pain in joint, shoulder region 08/12/2012   Pain in joint, upper arm 12/26/2010   Pain in limb 01/11/2011   Pain in thoracic spine 01/31/2012   Pathologic fracture of vertebrae 01/22/2012   PVC's (premature ventricular contractions)    Rash and other nonspecific skin eruption 08/02/2011   Senile osteoporosis 07/17/1993   Stroke ([place])    Unspecified essential hypertension 07/17/1997   Varicose veins of lower extremities 08/16/2009   Varicose veins of lower extremities 08/16/2009   Past Surgical History:  Procedure Laterality Date   COLONOSCOPY WITH PROPOFOL N/A 10/19/2015   Procedure: COLONOSCOPY WITH PROPOFOL;  Surgeon: Gatha Mayer, MD;  Location: [facility];  Service: Endoscopy;  Laterality: N/A;   CORONARY ARTERY BYPASS GRAFT  1998   x2; LIMA to LAD; SVG to diagonal off bypass   HEMORRHOID SURGERY  08/26/2012   Dr. Brantley Stage   LOOP RECORDER INSERTION N/A 05/13/2018   Procedure: LOOP RECORDER INSERTION;  Surgeon: [name], MD;  Location: [facility];  Service: Cardiovascular;  Laterality: N/A;   LOOP RECORDER REMOVAL N/A 05/26/2019   Procedure: LOOP RECORDER REMOVAL;  Surgeon: [name], MD;  Location: [facility];  Service: Cardiovascular;  Laterality: N/A;   MASS EXCISION Left 11/23/2015   Procedure: LEFT WRIST EXCISION CYST;  Surgeon: Leanora Cover, MD;  Location: [facility];  Service: Orthopedics;  Laterality: Left;   PACEMAKER IMPLANT N/A 05/26/2019   Procedure: PACEMAKER IMPLANT;  Surgeon: [name], MD;  Location: [facility];  Service: Cardiovascular;  Laterality: N/A;   SKIN BIOPSY  01/29/14   (R) neck; (R) scalp, 2 (L) neck; shave biopsy Superficial basal cell carcinoma Dr. Danella Sensing   TONSILLECTOMY  [id number]    Allergies  Allergen Reactions   Iodinated Contrast Media Nausea Only and Other (See Comments)    Severe nausea and also passed out   Iodine Nausea Only and Other  (See Comments)    "Allergic," per [facility]- PASSED OUT   Lodine [Etodolac] Other (See Comments)    "Allergic," per [facility]   Other Other (See Comments)    Seasonal Allergies   Bactrim Rash   Relafen [Nabumetone] Rash   Sulfamethoxazole-Trimethoprim Rash    In matrix   Sulfanilamide Rash    Outpatient Encounter Medications as of 12/08/2022  Medication Sig   acetaminophen (TYLENOL) 325 MG tablet Take 650 mg by mouth 3 (three) times daily as needed (for pain).   amLODipine (NORVASC) 5 MG tablet Take 5 mg by mouth daily.   Artificial Saliva (BIOTENE DRY MOUTH MOISTURIZING) SOLN Use as directed 1-2 sprays in the mouth or throat 3 (three) times daily.   aspirin 81 MG chewable tablet Chew 1 tablet (81 mg total) by mouth daily.   atenolol (TENORMIN) 50 MG tablet Take 1 tablet (50 mg total) by mouth 2 (two) times daily.   atorvastatin (LIPITOR) 40 MG tablet Take 1 tablet (40 mg total) by mouth daily.   Calcium  Carb-Cholecalciferol (CALCIUM 500 + D3 PO) Take 1 capsule by mouth in the morning.   diclofenac Sodium (VOLTAREN) 1 % GEL Apply 2 g topically as needed (BID PRN). (Patient not taking: Reported on 12/01/2022)   digoxin (LANOXIN) 0.125 MG tablet Take 1 tablet (0.125 mg total) by mouth every other day.   docusate (COLACE) 50 MG/5ML liquid Take 100 mg by mouth daily.   ferrous sulfate 300 (60 Fe) MG/5ML syrup Take 5 mLs (300 mg total) by mouth 3 (three) times a week.   hydrALAZINE (APRESOLINE) 10 MG tablet Take 10 mg by mouth 3 (three) times daily as needed (SBP>180).   levocetirizine (XYZAL) 5 MG tablet Take 5 mg by mouth at bedtime.   losartan (COZAAR) 25 MG tablet Take 25 mg by mouth daily.   polyethylene glycol (MIRALAX / GLYCOLAX) 17 g packet Take 17 g by mouth See admin instructions. Mix 17 grams of powder into 4-6 oz of beverage of choice and drink once a day   [DISCONTINUED] hydrochlorothiazide (HYDRODIURIL) 12.5 MG tablet Take 1 tablet (12.5 mg total) by mouth daily.   [DISCONTINUED] lidocaine  (LIDODERM) 5 % Place 1 patch onto the skin as needed (for back pain- Remove & Discard patch within 12 hours or as directed by MD). (Patient not taking: Reported on 12/01/2022)   [DISCONTINUED] LORazepam (ATIVAN) 1 MG tablet Take 1 tablet (1 mg total) by mouth at bedtime. (Patient not taking: Reported on 12/01/2022)   [DISCONTINUED] potassium chloride SA (KLOR-CON) 20 MEQ tablet Take 10 mEq by mouth every evening.   No facility-administered encounter medications on file as of 12/08/2022.    Review of Systems  Constitutional:  Positive for unexpected weight change. Negative for activity change, appetite change, chills, diaphoresis, fatigue and fever.  HENT:  Positive for rhinorrhea and trouble swallowing. Negative for congestion.   Respiratory:  Negative for cough, shortness of breath and wheezing.        Throat clearing, wet vocal quality   Cardiovascular:  Negative for chest pain, palpitations and leg swelling.  Gastrointestinal:  Negative for abdominal distention, abdominal pain, constipation and diarrhea.  Genitourinary:  Negative for difficulty urinating and dysuria.  Musculoskeletal:  Positive for gait problem. Negative for arthralgias, back pain, joint swelling and myalgias.  Skin:  Positive for wound.  Neurological:  Positive for speech difficulty and weakness. Negative for dizziness, tremors, seizures, syncope, facial asymmetry, light-headedness, numbness and headaches.  Psychiatric/Behavioral:  Negative for agitation, behavioral problems and confusion.     Immunization History  Administered Date(s) Administered   Influenza Whole 08/06/2012   Influenza, High Dose Seasonal PF 08/15/2019, 08/27/2020   Influenza,inj,Quad PF,6+ Mos 08/30/2018   Influenza-Unspecified 08/06/2013, 08/24/2014, 08/26/2015, 08/31/2016, 08/27/2017, 08/27/2020   Moderna Sars-Covid-2 Vaccination 11/16/2019, 12/16/2019, 09/16/2020   Pneumococcal Conjugate-13 09/22/2015   Pneumococcal Polysaccharide-23 08/29/2006    Td 10/07/2007   Zoster Recombinat (Shingrix) 02/12/2018, 05/13/2018   Zoster, Live 03/12/2008   Pertinent  Health Maintenance Due  Topic Date Due   INFLUENZA VACCINE  02/04/2023 (Originally 06/06/2022)   DEXA SCAN  Completed      10/02/2022    8:00 AM 10/03/2022   10:49 AM 10/04/2022    9:50 AM 10/06/2022   10:31 AM 12/01/2022    9:28 AM  Fall Risk  Falls in the past year?  '1 1 1 1  '$ Was there an injury with Fall?  '1 1 1 1  '$ Fall Risk Category Calculator  '3 3 2 2  '$ Fall Risk Category (Retired)  High High Moderate   (  RETIRED) Patient Fall Risk Level High fall risk High fall risk High fall risk High fall risk   Patient at Risk for Falls Due to  History of fall(s) History of fall(s) History of fall(s) History of fall(s)  Fall risk Follow up  Falls evaluation completed Falls evaluation completed Falls evaluation completed Falls evaluation completed   Functional Status Survey:    Vitals:   12/08/22 0904  Weight: 121 lb 3.2 oz (55 kg)   Body mass index is 19.56 kg/m. Physical Exam Vitals and nursing note reviewed.  Constitutional:      General: She is not in acute distress.    Appearance: She is not diaphoretic.     Comments: Alert at this time  HENT:     Head: Normocephalic and atraumatic.     Mouth/Throat:     Mouth: Mucous membranes are moist.     Pharynx: Oropharynx is clear.     Comments: Clearing throat, wet vocal quality  Neck:     Vascular: No JVD.  Cardiovascular:     Rate and Rhythm: Normal rate. Rhythm irregular.     Heart sounds: No murmur heard. Pulmonary:     Effort: Pulmonary effort is normal. No respiratory distress.     Breath sounds: Normal breath sounds. No wheezing.  Skin:    General: Skin is warm and dry.  Neurological:     Comments: Oriented to self and place Able to f/c Has left sided hemiplegia.      Labs reviewed: Recent Labs    09/30/22 0931 10/01/22 0806 10/02/22 0900 10/21/22 0000 10/31/22 0000  NA 141 142 142 135* 137  K 3.6 3.3*  3.9 3.9 3.3*  CL 108 111 116* 96* 99  CO2 22 20* 19* 26* 25*  GLUCOSE 107* 114* 108*  --   --   BUN 34* 36* 35* 28* 38*  CREATININE 1.25* 1.21* 1.09* 1.1 1.1  CALCIUM 8.5* 8.2* 8.2* 9.2 8.6*   Recent Labs    09/26/22 1539 10/21/22 0000 10/31/22 0000  AST 17 62* 49*  ALT 12 69* 52*  ALKPHOS 71 110 85  BILITOT 0.3  --   --   PROT 7.1  --   --   ALBUMIN 3.1* 3.2* 3.0*   Recent Labs    09/30/22 0931 10/01/22 0806 10/02/22 0900 10/09/22 0000 10/12/22 0000 10/16/22 0000 10/20/22 0000 10/31/22 0000  WBC 10.7* 11.0* 9.9 18.8 17.7 15.1 15.2 11.4  NEUTROABS  --   --   --  11.10 11.60 9.40  --   --   HGB 9.2* 8.5* 8.8* 9.3* 9.7* 8.6* 9.0* 8.2*  HCT 28.9* 25.7* 27.2* 29* 30* 27* 28* 25*  MCV 88.1 87.1 86.6  --   --   --   --   --   PLT 96* 87* 109* 179 210 217 187 103*   Lab Results  Component Value Date   TSH 2.87 02/03/2022   Lab Results  Component Value Date   HGBA1C 5.7 (H) 09/26/2022   Lab Results  Component Value Date   CHOL 161 09/27/2022   HDL 39 (L) 09/27/2022   LDLCALC 114 (H) 09/27/2022   LDLDIRECT 81.3 08/19/2020   TRIG 39 09/27/2022   CHOLHDL 4.1 09/27/2022    Significant Diagnostic Results in last 30 days:  CUP PACEART REMOTE DEVICE CHECK  Result Date: 12/01/2022 Scheduled remote reviewed. Normal device function.  Next remote 91 days. LA   Assessment/Plan  1. Dehydration NS 80/cc hr for 1 liter Encourage  oral fluid q 2 hrs while awake and safe to swallow CMP Monday  If no progress will re address goals of care. I spoke with her daughter [name] and she is interested in IVF. The cause of her dehydration is dysphagia/decreased intake which is not a reversible issue at this time. She does have a DNR and does not wish for a feeding tube.   2. Essential hypertension Reduce losartan to 25 mg due to soft bp and renal insuff  3. Elevated LFTs Stop statin due to this and concern for s/e  4. Oropharyngeal dysphagia Continues to work with speech  therapy On puree diet with HTL asp prec High risk of aspiration  Difficult to maintain hydration   5. Neurologic deficit due to acute ischemic cerebrovascular accident (CVA) ([place]) Left sided neglect, weakness, dysphagia Second large CVA with no significant progress at this time.   6. Renal insufficiency Due to poor intake, will hydrate  Family/ staff Communication: [name] daughter POA  Labs/tests ordered:  CMP 2/5, CBC pending

## 2022-12-09 DIAGNOSIS — I6621 Occlusion and stenosis of right posterior cerebral artery: Secondary | ICD-10-CM | POA: Diagnosis not present

## 2022-12-09 DIAGNOSIS — R278 Other lack of coordination: Secondary | ICD-10-CM | POA: Diagnosis not present

## 2022-12-09 DIAGNOSIS — I6521 Occlusion and stenosis of right carotid artery: Secondary | ICD-10-CM | POA: Diagnosis not present

## 2022-12-09 DIAGNOSIS — M6389 Disorders of muscle in diseases classified elsewhere, multiple sites: Secondary | ICD-10-CM | POA: Diagnosis not present

## 2022-12-09 DIAGNOSIS — I69354 Hemiplegia and hemiparesis following cerebral infarction affecting left non-dominant side: Secondary | ICD-10-CM | POA: Diagnosis not present

## 2022-12-09 DIAGNOSIS — I69322 Dysarthria following cerebral infarction: Secondary | ICD-10-CM | POA: Diagnosis not present

## 2022-12-11 ENCOUNTER — Encounter: Payer: Self-pay | Admitting: Internal Medicine

## 2022-12-11 DIAGNOSIS — I6621 Occlusion and stenosis of right posterior cerebral artery: Secondary | ICD-10-CM | POA: Diagnosis not present

## 2022-12-11 DIAGNOSIS — E860 Dehydration: Secondary | ICD-10-CM | POA: Diagnosis not present

## 2022-12-11 DIAGNOSIS — Z79899 Other long term (current) drug therapy: Secondary | ICD-10-CM | POA: Diagnosis not present

## 2022-12-11 DIAGNOSIS — I69354 Hemiplegia and hemiparesis following cerebral infarction affecting left non-dominant side: Secondary | ICD-10-CM | POA: Diagnosis not present

## 2022-12-11 DIAGNOSIS — R278 Other lack of coordination: Secondary | ICD-10-CM | POA: Diagnosis not present

## 2022-12-11 DIAGNOSIS — I69322 Dysarthria following cerebral infarction: Secondary | ICD-10-CM | POA: Diagnosis not present

## 2022-12-11 DIAGNOSIS — I6521 Occlusion and stenosis of right carotid artery: Secondary | ICD-10-CM | POA: Diagnosis not present

## 2022-12-11 DIAGNOSIS — M6389 Disorders of muscle in diseases classified elsewhere, multiple sites: Secondary | ICD-10-CM | POA: Diagnosis not present

## 2022-12-11 LAB — HEPATIC FUNCTION PANEL
ALT: 73 U/L — AB (ref 7–35)
AST: 60 — AB (ref 13–35)
Alkaline Phosphatase: 94 (ref 25–125)
Bilirubin, Total: 0.3

## 2022-12-11 LAB — COMPREHENSIVE METABOLIC PANEL
Albumin: 2.9 — AB (ref 3.5–5.0)
Calcium: 8.8 (ref 8.7–10.7)
Globulin: 3.4
eGFR: 48

## 2022-12-11 LAB — BASIC METABOLIC PANEL
BUN: 59 — AB (ref 4–21)
CO2: 22 (ref 13–22)
Chloride: 108 (ref 99–108)
Creatinine: 1.1 (ref 0.5–1.1)
Glucose: 92
Potassium: 3.9 mEq/L (ref 3.5–5.1)
Sodium: 144 (ref 137–147)

## 2022-12-11 NOTE — Progress Notes (Signed)
Location:  [facility] [facility] Room Number: 118-A Place of Service:  SNF [phone]) Provider:  [name], MD   [name], MD  Patient Care Team: [name], MD as PCP - General (Internal Medicine) [name], MD as PCP - Electrophysiology (Cardiology) [name], MD as Consulting Physician (Dermatology) [name], MD as Consulting Physician (Cardiology) [facility] [name], MD as Consulting Physician (Dermatology)  Extended Emergency Contact Information Primary Emergency Contact: [name] of [place] Phone: [phone] Mobile Phone: [phone] Relation: Daughter Secondary Emergency Contact: Terrilyn Saver of [place] Phone: [phone] Mobile Phone: [phone] Relation: Son  Code Status:  DNR Goals of care: Advanced Directive information    12/11/2022    4:14 PM  Advanced Directives  Does Patient Have a Medical Advance Directive? Yes  Type of Paramedic of [place];Living will;Out of facility DNR (pink MOST or yellow form)  Does patient want to make changes to medical advance directive? No - Patient declined  Copy of [place] in Chart? Yes - validated most recent copy scanned in chart (See row information)  Pre-existing out of facility DNR order (yellow form or pink MOST form) Pink MOST/Yellow Form most recent copy in chart - Physician notified to receive inpatient order     Chief Complaint  Patient presents with   Acute Visit    Dehydration     HPI:  Pt is a 89 y.o. female seen today for an acute visit for    Past Medical History:  Diagnosis Date   Actinic keratosis 05/25/2014   Anxiety    Atrial fibrillation ([place]) 09/21/2010   Basal cell carcinoma 05/25/2014   Multiple removed by Dr. Danella Sensing in March 2015: right neck, left neck, scalp    Bell's palsy 07/18/1979   Cervicalgia 01/31/2012    Closed fracture of lumbar vertebra without mention of spinal cord injury 07/17/2005   Conjunctiva disorder 12/26/2010   Coronary atherosclerosis of native coronary artery 07/17/1997   Cramp of limb 08/16/2009   Degeneration of lumbar or lumbosacral intervertebral disc 01/31/2012   Disturbance of skin sensation 08/2009   Dizziness and giddiness 11/08/2011   vertigo   External hemorrhoids without mention of complication [id number]   External hemorrhoids without mention of complication [id number]   Ganglion of tendon sheath 08/16/2009   Leg cramp 10/27/2013   Most frequently the right leg.    Long term (current) use of anticoagulants 09/2010   Lumbago 07/2009   Meralgia paresthetica 07/2007   MI, old    Myalgia and myositis, unspecified 01/17/2012   Osteoporosis    Other abnormal blood chemistry 04/08/2012   Other abnormal blood chemistry 2013   hyperglycemia   Other disorder of muscle, ligament, and fascia 04/08/2012   Other specified cardiac dysrhythmias(427.89) 06/27/2010   Pacemaker 05/06/2020   Pain in joint, ankle and foot 10/27/2013   Bilateral since 1998    Pain in joint, shoulder region 08/12/2012   Pain in joint, upper arm 12/26/2010   Pain in limb 01/11/2011   Pain in thoracic spine 01/31/2012   Pathologic fracture of vertebrae 01/22/2012   PVC's (premature ventricular contractions)    Rash and other nonspecific skin eruption 08/02/2011   Senile osteoporosis 07/17/1993   Stroke ([place])    Unspecified essential hypertension 07/17/1997   Varicose veins of lower extremities 08/16/2009   Varicose veins of lower extremities 08/16/2009   Past Surgical History:  Procedure  Laterality Date   COLONOSCOPY WITH PROPOFOL N/A 10/19/2015   Procedure: COLONOSCOPY WITH PROPOFOL;  Surgeon: Gatha Mayer, MD;  Location: [facility];  Service: Endoscopy;  Laterality: N/A;   CORONARY ARTERY BYPASS GRAFT  1998   x2; LIMA to LAD; SVG to diagonal off bypass   HEMORRHOID SURGERY   08/26/2012   Dr. Brantley Stage   LOOP RECORDER INSERTION N/A 05/13/2018   Procedure: LOOP RECORDER INSERTION;  Surgeon: [name], MD;  Location: [facility];  Service: Cardiovascular;  Laterality: N/A;   LOOP RECORDER REMOVAL N/A 05/26/2019   Procedure: LOOP RECORDER REMOVAL;  Surgeon: [name], MD;  Location: [facility];  Service: Cardiovascular;  Laterality: N/A;   MASS EXCISION Left 11/23/2015   Procedure: LEFT WRIST EXCISION CYST;  Surgeon: Leanora Cover, MD;  Location: [facility];  Service: Orthopedics;  Laterality: Left;   PACEMAKER IMPLANT N/A 05/26/2019   Procedure: PACEMAKER IMPLANT;  Surgeon: [name], MD;  Location: [facility];  Service: Cardiovascular;  Laterality: N/A;   SKIN BIOPSY  01/29/14   (R) neck; (R) scalp, 2 (L) neck; shave biopsy Superficial basal cell carcinoma Dr. Danella Sensing   TONSILLECTOMY  [phone]    Allergies  Allergen Reactions   Iodinated Contrast Media Nausea Only and Other (See Comments)    Severe nausea and also passed out   Iodine Nausea Only and Other (See Comments)    "Allergic," per [facility]- PASSED OUT   Lodine [Etodolac] Other (See Comments)    "Allergic," per [facility]   Other Other (See Comments)    Seasonal Allergies   Bactrim Rash   Relafen [Nabumetone] Rash   Sulfamethoxazole-Trimethoprim Rash    In matrix   Sulfanilamide Rash    Outpatient Encounter Medications as of 12/11/2022  Medication Sig   acetaminophen (TYLENOL) 325 MG tablet Take 650 mg by mouth 3 (three) times daily as needed (for pain).   amLODipine (NORVASC) 5 MG tablet Take 5 mg by mouth daily.   Artificial Saliva (BIOTENE DRY MOUTH MOISTURIZING) SOLN Use as directed 1-2 sprays in the mouth or throat 3 (three) times daily.   aspirin 81 MG chewable tablet Chew 1 tablet (81 mg total) by mouth daily.   atenolol (TENORMIN) 50 MG tablet Take 1 tablet (50 mg total) by mouth 2 (two) times daily.   Calcium Carb-Cholecalciferol (CALCIUM 500 + D3  PO) Take 1 capsule by mouth in the morning.   diclofenac Sodium (VOLTAREN) 1 % GEL Apply 2 g topically as needed (BID PRN).   digoxin (LANOXIN) 0.125 MG tablet Take 1 tablet (0.125 mg total) by mouth every other day.   docusate (COLACE) 50 MG/5ML liquid Take 100 mg by mouth daily.   hydrALAZINE (APRESOLINE) 10 MG tablet Take 10 mg by mouth 3 (three) times daily as needed (SBP>180).   levocetirizine (XYZAL) 5 MG tablet Take 5 mg by mouth at bedtime.   losartan (COZAAR) 25 MG tablet Take 25 mg by mouth daily.   polyethylene glycol (MIRALAX / GLYCOLAX) 17 g packet Take 17 g by mouth See admin instructions. Mix 17 grams of powder into 4-6 oz of beverage of choice and drink once a day   sodium chloride (OCEAN) 0.65 % nasal spray Place 1 spray into the nose 4 (four) times daily as needed for congestion.   atorvastatin (LIPITOR) 40 MG tablet Take 1 tablet (40 mg total) by mouth daily. (Patient not taking: Reported on 12/11/2022)   ferrous sulfate 300 (  60 Fe) MG/5ML syrup Take 5 mLs (300 mg total) by mouth 3 (three) times a week. (Patient not taking: Reported on 12/11/2022)   No facility-administered encounter medications on file as of 12/11/2022.    Review of Systems  Immunization History  Administered Date(s) Administered   Influenza Whole 08/06/2012   Influenza, High Dose Seasonal PF 08/15/2019, 08/27/2020   Influenza,inj,Quad PF,6+ Mos 08/30/2018   Influenza-Unspecified 08/06/2013, 08/24/2014, 08/26/2015, 08/31/2016, 08/27/2017, 08/27/2020   Moderna Sars-Covid-2 Vaccination 11/16/2019, 12/16/2019, 09/16/2020   Pneumococcal Conjugate-13 09/22/2015   Pneumococcal Polysaccharide-23 08/29/2006   Td 10/07/2007   Zoster Recombinat (Shingrix) 02/12/2018, 05/13/2018   Zoster, Live 03/12/2008   Pertinent  Health Maintenance Due  Topic Date Due   INFLUENZA VACCINE  02/04/2023 (Originally 06/06/2022)   DEXA SCAN  Completed      10/02/2022    8:00 AM 10/03/2022   10:49 AM 10/04/2022    9:50 AM  10/06/2022   10:31 AM 12/01/2022    9:28 AM  Fall Risk  Falls in the past year?  1 1 1 1  $ Was there an injury with Fall?  1 1 1 1  $ Fall Risk Category Calculator  3 3 2 2  $ Fall Risk Category (Retired)  High High Moderate   (RETIRED) Patient Fall Risk Level High fall risk High fall risk High fall risk High fall risk   Patient at Risk for Falls Due to  History of fall(s) History of fall(s) History of fall(s) History of fall(s)  Fall risk Follow up  Falls evaluation completed Falls evaluation completed Falls evaluation completed Falls evaluation completed   Functional Status Survey:    Vitals:   12/11/22 1603  BP: 114/66  Pulse: 81  Resp: 17  Temp: (!) 97.2 F (36.2 C)  SpO2: 96%  Weight: 121 lb 3.2 oz (55 kg)  Height: 5' 6"$  (1.676 m)   Body mass index is 19.56 kg/m. Physical Exam  Labs reviewed: Recent Labs    09/30/22 0931 10/01/22 0806 10/02/22 0900 10/21/22 0000 10/31/22 0000 12/07/22 0000 12/11/22 0000  NA 141 142 142   < > 137 139 144  K 3.6 3.3* 3.9   < > 3.3* 4.0 3.9  CL 108 111 116*   < > 99 104 108  CO2 22 20* 19*   < > 25* 21 22  GLUCOSE 107* 114* 108*  --   --   --   --   BUN 34* 36* 35*   < > 38* 82* 59*  CREATININE 1.25* 1.21* 1.09*   < > 1.1 1.3* 1.1  CALCIUM 8.5* 8.2* 8.2*   < > 8.6* 8.9 8.8   < > = values in this interval not displayed.   Recent Labs    09/26/22 1539 10/21/22 0000 10/31/22 0000 12/07/22 0000 12/11/22 0000  AST 17   < > 49* 58* 60*  ALT 12   < > 52* 70* 73*  ALKPHOS 71   < > 85 99 94  BILITOT 0.3  --   --   --   --   PROT 7.1  --   --   --   --   ALBUMIN 3.1*   < > 3.0* 3.1* 2.9*   < > = values in this interval not displayed.   Recent Labs    09/30/22 0931 10/01/22 0806 10/02/22 0900 10/09/22 0000 10/12/22 0000 10/16/22 0000 10/20/22 0000 10/31/22 0000 12/08/22 0000  WBC 10.7* 11.0* 9.9 18.8 17.7 15.1 15.2 11.4 10.4  NEUTROABS  --   --   --  11.10 11.60 9.40  --   --   --   HGB 9.2* 8.5* 8.8* 9.3* 9.7* 8.6* 9.0*  8.2* 8.3*  HCT 28.9* 25.7* 27.2* 29* 30* 27* 28* 25* 25*  MCV 88.1 87.1 86.6  --   --   --   --   --   --   PLT 96* 87* 109* 179 210 217 187 103* 187   Lab Results  Component Value Date   TSH 2.87 02/03/2022   Lab Results  Component Value Date   HGBA1C 5.7 (H) 09/26/2022   Lab Results  Component Value Date   CHOL 161 09/27/2022   HDL 39 (L) 09/27/2022   LDLCALC 114 (H) 09/27/2022   LDLDIRECT 81.3 08/19/2020   TRIG 39 09/27/2022   CHOLHDL 4.1 09/27/2022    Significant Diagnostic Results in last 30 days:  CUP PACEART REMOTE DEVICE CHECK  Result Date: 12/01/2022 Scheduled remote reviewed. Normal device function.  Next remote 91 days. LA   Assessment/Plan There are no diagnoses linked to this encounter.   Family/ staff Communication: ***  Labs/tests ordered:  ***

## 2022-12-12 ENCOUNTER — Non-Acute Institutional Stay (SKILLED_NURSING_FACILITY): Payer: Medicare Other | Admitting: Internal Medicine

## 2022-12-12 ENCOUNTER — Encounter: Payer: Self-pay | Admitting: Internal Medicine

## 2022-12-12 DIAGNOSIS — M6389 Disorders of muscle in diseases classified elsewhere, multiple sites: Secondary | ICD-10-CM | POA: Diagnosis not present

## 2022-12-12 DIAGNOSIS — I69354 Hemiplegia and hemiparesis following cerebral infarction affecting left non-dominant side: Secondary | ICD-10-CM | POA: Diagnosis not present

## 2022-12-12 DIAGNOSIS — R278 Other lack of coordination: Secondary | ICD-10-CM | POA: Diagnosis not present

## 2022-12-12 DIAGNOSIS — E860 Dehydration: Secondary | ICD-10-CM | POA: Diagnosis not present

## 2022-12-12 DIAGNOSIS — R7989 Other specified abnormal findings of blood chemistry: Secondary | ICD-10-CM

## 2022-12-12 DIAGNOSIS — R1312 Dysphagia, oropharyngeal phase: Secondary | ICD-10-CM

## 2022-12-12 DIAGNOSIS — I10 Essential (primary) hypertension: Secondary | ICD-10-CM

## 2022-12-12 DIAGNOSIS — I6621 Occlusion and stenosis of right posterior cerebral artery: Secondary | ICD-10-CM | POA: Diagnosis not present

## 2022-12-12 DIAGNOSIS — I69322 Dysarthria following cerebral infarction: Secondary | ICD-10-CM | POA: Diagnosis not present

## 2022-12-12 DIAGNOSIS — I6521 Occlusion and stenosis of right carotid artery: Secondary | ICD-10-CM | POA: Diagnosis not present

## 2022-12-12 NOTE — Progress Notes (Signed)
Location:  [place] Room Number: 118/A Place of Service:  SNF [phone]) Provider:  [name], MD   [name], MD  Patient Care Team: [name], MD as PCP - General (Internal Medicine) [name], MD as PCP - Electrophysiology (Cardiology) [name], MD as Consulting Physician (Dermatology) [name], MD as Consulting Physician (Cardiology) [facility] [name], MD as Consulting Physician (Dermatology)  Extended Emergency Contact Information Primary Emergency Contact: [name] of [place] Phone: [phone] Mobile Phone: [phone] Relation: Daughter Secondary Emergency Contact: Terrilyn Saver of [place] Phone: [phone] Mobile Phone: [phone] Relation: Son  Code Status:  DNR Goals of care: Advanced Directive information    12/12/2022    3:53 PM  Advanced Directives  Does Patient Have a Medical Advance Directive? Yes  Type of Paramedic of [place];Living will;Out of facility DNR (pink MOST or yellow form)  Does patient want to make changes to medical advance directive? No - Patient declined  Copy of [place] in Chart? Yes - validated most recent copy scanned in chart (See row information)  Pre-existing out of facility DNR order (yellow form or pink MOST form) Pink MOST/Yellow Form most recent copy in chart - Physician notified to receive inpatient order     Chief Complaint  Patient presents with   Acute Visit    Patient is being seen for dehydration    Quality Metric Gaps    Discussed the need for tdap and AWv    HPI:  Pt is a 89 y.o. female seen today for an acute visit for Poor Appetite and Goals of care  Lives in SNF in [place]   Admitted in the hospital from 11/21-11/27 for 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, likely embolic given afib not on [facility]   Was send to ED for acute onset  of Dysarthria and Right Gaze with Left Facial Droop  CT scan showed Large Acute right PCA infarct She got TNK  Eliquis not started after discussion with Cardiology On Aspirin only   Seen on 12/08/22 for Lethargy Decreased PO intake Labs showed BUN of 82 Cr 1.32 Lost 10 lbs in past few weeks Got 1 Litre of NS Then she got better and is now more  awake Nurses say her Intake is still Poor. 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 Danella Sensing in March 2015: right neck, left neck, scalp    Bell's palsy 07/18/1979   Cervicalgia 01/31/2012   Closed fracture of lumbar  vertebra without mention of spinal cord injury 07/17/2005   Conjunctiva disorder 12/26/2010   Coronary atherosclerosis of native coronary artery 07/17/1997   Cramp of limb 08/16/2009   Degeneration of lumbar or lumbosacral intervertebral disc 01/31/2012   Disturbance of skin sensation 08/2009   Dizziness and giddiness 11/08/2011   vertigo   External hemorrhoids without mention of complication [id number]   External hemorrhoids without mention of complication [id number]   Ganglion of tendon sheath  08/16/2009   Leg cramp 10/27/2013   Most frequently the right leg.    Long term (current) use of anticoagulants 09/2010   Lumbago 07/2009   Meralgia paresthetica 07/2007   MI, old    Myalgia and myositis, unspecified 01/17/2012   Osteoporosis    Other abnormal blood chemistry 04/08/2012   Other abnormal blood chemistry 2013   hyperglycemia   Other disorder of muscle, ligament, and fascia 04/08/2012   Other specified cardiac dysrhythmias(427.89) 06/27/2010   Pacemaker 05/06/2020   Pain in joint, ankle and foot 10/27/2013   Bilateral since 1998    Pain in joint, shoulder region 08/12/2012   Pain in joint, upper arm 12/26/2010   Pain in limb 01/11/2011   Pain in thoracic spine 01/31/2012   Pathologic fracture of vertebrae 01/22/2012   PVC's (premature ventricular contractions)    Rash and other nonspecific skin eruption 08/02/2011   Senile osteoporosis 07/17/1993   Stroke ([place])    Unspecified essential hypertension 07/17/1997   Varicose veins of lower extremities 08/16/2009   Varicose veins of lower extremities 08/16/2009   Past Surgical History:  Procedure Laterality Date   COLONOSCOPY WITH PROPOFOL N/A 10/19/2015   Procedure: COLONOSCOPY WITH PROPOFOL;  Surgeon: Gatha Mayer, MD;  Location: [facility];  Service: Endoscopy;  Laterality: N/A;   CORONARY ARTERY BYPASS GRAFT  1998   x2; LIMA to LAD; SVG to diagonal off bypass   HEMORRHOID SURGERY  08/26/2012   Dr. Brantley Stage   LOOP RECORDER INSERTION N/A 05/13/2018   Procedure: LOOP RECORDER INSERTION;  Surgeon: [name], MD;  Location: [place] CV LAB;  Service: Cardiovascular;  Laterality: N/A;   LOOP RECORDER REMOVAL N/A 05/26/2019   Procedure: LOOP RECORDER REMOVAL;  Surgeon: [name], MD;  Location: [place] [facility];  Service: Cardiovascular;  Laterality: N/A;   MASS EXCISION Left 11/23/2015   Procedure: LEFT WRIST EXCISION CYST;  Surgeon: Leanora Cover, MD;  Location: [place];  Service:  Orthopedics;  Laterality: Left;   PACEMAKER IMPLANT N/A 05/26/2019   Procedure: PACEMAKER IMPLANT;  Surgeon: [name], MD;  Location: [place] CV LAB;  Service: Cardiovascular;  Laterality: N/A;   SKIN BIOPSY  01/29/14   (R) neck; (R) scalp, 2 (L) neck; shave biopsy Superficial basal cell carcinoma Dr. Danella Sensing   TONSILLECTOMY  [id number]    Allergies  Allergen Reactions   Iodinated Contrast Media Nausea Only and Other (See Comments)    Severe nausea and also passed out   Iodine Nausea Only and Other (See Comments)    "Allergic," per [facility]- PASSED OUT   Lodine [Etodolac] Other (See Comments)    "Allergic," per [facility]   Other Other (See Comments)    Seasonal Allergies   Bactrim Rash   Relafen [Nabumetone] Rash   Sulfamethoxazole-Trimethoprim Rash    In matrix   Sulfanilamide Rash    Outpatient Encounter Medications as of 12/12/2022  Medication Sig   acetaminophen (TYLENOL) 325 MG tablet Take 650 mg by mouth 3 (three) times daily  as needed (for pain).   amLODipine (NORVASC) 5 MG tablet Take 5 mg by mouth daily.   Artificial Saliva (BIOTENE DRY MOUTH MOISTURIZING) SOLN Use as directed 1-2 sprays in the mouth or throat 3 (three) times daily.   aspirin 81 MG chewable tablet Chew 1 tablet (81 mg total) by mouth daily.   atenolol (TENORMIN) 50 MG tablet Take 1 tablet (50 mg total) by mouth 2 (two) times daily.   Calcium Carb-Cholecalciferol (CALCIUM 500 + D3 PO) Take 1 capsule by mouth in the morning.   diclofenac Sodium (VOLTAREN) 1 % GEL Apply 2 g topically as needed (BID PRN).   digoxin (LANOXIN) 0.125 MG tablet Take 1 tablet (0.125 mg total) by mouth every other day.   docusate (COLACE) 50 MG/5ML liquid Take 100 mg by mouth daily.   hydrALAZINE (APRESOLINE) 10 MG tablet Take 10 mg by mouth 3 (three) times daily as needed (SBP>180).   levocetirizine (XYZAL) 5 MG tablet Take 5 mg by mouth at bedtime.   losartan (COZAAR) 25 MG tablet Take 25 mg by mouth daily.   polyethylene glycol  (MIRALAX / GLYCOLAX) 17 g packet Take 17 g by mouth See admin instructions. Mix 17 grams of powder into 4-6 oz of beverage of choice and drink once a day   sodium chloride (OCEAN) 0.65 % nasal spray Place 1 spray into the nose 4 (four) times daily as needed for congestion.   atorvastatin (LIPITOR) 40 MG tablet Take 1 tablet (40 mg total) by mouth daily. (Patient not taking: Reported on 12/12/2022)   ferrous sulfate 300 (60 Fe) MG/5ML syrup Take 5 mLs (300 mg total) by mouth 3 (three) times a week. (Patient not taking: Reported on 12/11/2022)   No facility-administered encounter medications on file as of 12/12/2022.    Review of Systems  Constitutional:  Positive for activity change, appetite change and unexpected weight change.  HENT: Negative.    Respiratory:  Negative for cough and shortness of breath.   Cardiovascular:  Negative for leg swelling.  Gastrointestinal:  Negative for constipation.  Genitourinary: Negative.   Musculoskeletal:  Positive for gait problem. Negative for arthralgias and myalgias.  Skin: Negative.   Neurological:  Negative for dizziness and weakness.  Psychiatric/Behavioral:  Positive for confusion. Negative for dysphoric mood and sleep disturbance.     Immunization History  Administered Date(s) Administered   Influenza Whole 08/06/2012   Influenza, High Dose Seasonal PF 08/15/2019, 08/27/2020   Influenza,inj,Quad PF,6+ Mos 08/30/2018   Influenza-Unspecified 08/06/2013, 08/24/2014, 08/26/2015, 08/31/2016, 08/27/2017, 08/27/2020   Moderna Sars-Covid-2 Vaccination 11/16/2019, 12/16/2019, 09/16/2020   Pneumococcal Conjugate-13 09/22/2015   Pneumococcal Polysaccharide-23 08/29/2006   Td 10/07/2007   Zoster Recombinat (Shingrix) 02/12/2018, 05/13/2018   Zoster, Live 03/12/2008   Pertinent  Health Maintenance Due  Topic Date Due   INFLUENZA VACCINE  02/04/2023 (Originally 06/06/2022)   DEXA SCAN  Completed      10/02/2022    8:00 AM 10/03/2022   10:49 AM  10/04/2022    9:50 AM 10/06/2022   10:31 AM 12/01/2022    9:28 AM  Fall Risk  Falls in the past year?  1 1 1 1  $ Was there an injury with Fall?  1 1 1 1  $ Fall Risk Category Calculator  3 3 2 2  $ Fall Risk Category (Retired)  High High Moderate   (RETIRED) Patient Fall Risk Level High fall risk High fall risk High fall risk High fall risk   Patient at Risk for Falls Due to  History of fall(s) History of fall(s) History of fall(s) History of fall(s)  Fall risk Follow up  Falls evaluation completed Falls evaluation completed Falls evaluation completed Falls evaluation completed   Functional Status Survey:    Vitals:   12/12/22 1550  BP: (!) 146/76  Pulse: 83  Resp: 17  Temp: 97.9 F (36.6 C)  TempSrc: Temporal  SpO2: 98%  Weight: 121 lb 3.2 oz (55 kg)  Height: 5' 6"$  (1.676 m)   Body mass index is 19.56 kg/m. Physical Exam Vitals reviewed.  Constitutional:      Comments: Slurred speech Left Facial Droop  HENT:     Head: Normocephalic.     Nose: Nose normal.     Mouth/Throat:     Mouth: Mucous membranes are moist.     Pharynx: Oropharynx is clear.  Eyes:     Pupils: Pupils are equal, round, and reactive to light.  Cardiovascular:     Rate and Rhythm: Normal rate. Rhythm irregular.     Pulses: Normal pulses.     Heart sounds: Normal heart sounds. No murmur heard. Pulmonary:     Effort: Pulmonary effort is normal.     Breath sounds: Normal breath sounds. No rales.  Abdominal:     General: Abdomen is flat. Bowel sounds are normal.     Palpations: Abdomen is soft.  Musculoskeletal:        General: No swelling.     Cervical back: Neck supple.  Skin:    General: Skin is warm.  Neurological:     Mental Status: She is alert.     Comments: Left Dense Hemiparesis  Psychiatric:        Mood and Affect: Mood normal.        Thought Content: Thought content normal.     Labs reviewed: Recent Labs    09/30/22 0931 10/01/22 0806 10/02/22 0900 10/21/22 0000 10/31/22 0000  12/07/22 0000 12/11/22 0000  NA 141 142 142   < > 137 139 144  K 3.6 3.3* 3.9   < > 3.3* 4.0 3.9  CL 108 111 116*   < > 99 104 108  CO2 22 20* 19*   < > 25* 21 22  GLUCOSE 107* 114* 108*  --   --   --   --   BUN 34* 36* 35*   < > 38* 82* 59*  CREATININE 1.25* 1.21* 1.09*   < > 1.1 1.3* 1.1  CALCIUM 8.5* 8.2* 8.2*   < > 8.6* 8.9 8.8   < > = values in this interval not displayed.   Recent Labs    09/26/22 1539 10/21/22 0000 10/31/22 0000 12/07/22 0000 12/11/22 0000  AST 17   < > 49* 58* 60*  ALT 12   < > 52* 70* 73*  ALKPHOS 71   < > 85 99 94  BILITOT 0.3  --   --   --   --   PROT 7.1  --   --   --   --   ALBUMIN 3.1*   < > 3.0* 3.1* 2.9*   < > = values in this interval not displayed.   Recent Labs    09/30/22 0931 10/01/22 0806 10/02/22 0900 10/09/22 0000 10/12/22 0000 10/16/22 0000 10/20/22 0000 10/31/22 0000 12/08/22 0000  WBC 10.7* 11.0* 9.9 18.8 17.7 15.1 15.2 11.4 10.4  NEUTROABS  --   --   --  11.10 11.60 9.40  --   --   --  HGB 9.2* 8.5* 8.8* 9.3* 9.7* 8.6* 9.0* 8.2* 8.3*  HCT 28.9* 25.7* 27.2* 29* 30* 27* 28* 25* 25*  MCV 88.1 87.1 86.6  --   --   --   --   --   --   PLT 96* 87* 109* 179 210 217 187 103* 187   Lab Results  Component Value Date   TSH 2.87 02/03/2022   Lab Results  Component Value Date   HGBA1C 5.7 (H) 09/26/2022   Lab Results  Component Value Date   CHOL 161 09/27/2022   HDL 39 (L) 09/27/2022   LDLCALC 114 (H) 09/27/2022   LDLDIRECT 81.3 08/19/2020   TRIG 39 09/27/2022   CHOLHDL 4.1 09/27/2022    Significant Diagnostic Results in last 30 days:  CUP PACEART REMOTE DEVICE CHECK  Result Date: 12/01/2022 Scheduled remote reviewed. Normal device function.  Next remote 91 days. LA   Assessment/Plan 1. Dehydration Will give her one more litre of NS Discussed with daughter that this is not acute but more chronic Change due to her Stroke and Poor Intake. We will try this  and repeat Labs in 1 week If she doesn't make any  improvement have to consider Palliative or Hospice  2. Elevated LFTs Not sure why Her statin is on hold Will repeat labs in 1 week  3. Essential hypertension Cozaar and Norvasc  4. Oropharyngeal dysphagia Speech working but very poor oral intake 5 Anemia Holding iron to see if she will eat more Repeat CBC next week  Neurologic deficit due to acute ischemic cerebrovascular accident (CVA) (HCC) Aspirin only  Permanent atrial fibrillation (HCC) On Aspirin and Toprol Has had bleeding issues with DOCA and Coumadin Daughter has decided for Aspirin only for now    Family/ staff Communication: Daughter  Labs/tests ordered:  CBC,CMP in 1 week

## 2022-12-13 DIAGNOSIS — I69354 Hemiplegia and hemiparesis following cerebral infarction affecting left non-dominant side: Secondary | ICD-10-CM | POA: Diagnosis not present

## 2022-12-13 DIAGNOSIS — R278 Other lack of coordination: Secondary | ICD-10-CM | POA: Diagnosis not present

## 2022-12-13 DIAGNOSIS — I6521 Occlusion and stenosis of right carotid artery: Secondary | ICD-10-CM | POA: Diagnosis not present

## 2022-12-13 DIAGNOSIS — I69322 Dysarthria following cerebral infarction: Secondary | ICD-10-CM | POA: Diagnosis not present

## 2022-12-13 DIAGNOSIS — I6621 Occlusion and stenosis of right posterior cerebral artery: Secondary | ICD-10-CM | POA: Diagnosis not present

## 2022-12-13 DIAGNOSIS — M6389 Disorders of muscle in diseases classified elsewhere, multiple sites: Secondary | ICD-10-CM | POA: Diagnosis not present

## 2022-12-14 DIAGNOSIS — I6621 Occlusion and stenosis of right posterior cerebral artery: Secondary | ICD-10-CM | POA: Diagnosis not present

## 2022-12-14 DIAGNOSIS — R278 Other lack of coordination: Secondary | ICD-10-CM | POA: Diagnosis not present

## 2022-12-14 DIAGNOSIS — I69354 Hemiplegia and hemiparesis following cerebral infarction affecting left non-dominant side: Secondary | ICD-10-CM | POA: Diagnosis not present

## 2022-12-14 DIAGNOSIS — M6389 Disorders of muscle in diseases classified elsewhere, multiple sites: Secondary | ICD-10-CM | POA: Diagnosis not present

## 2022-12-14 DIAGNOSIS — I69322 Dysarthria following cerebral infarction: Secondary | ICD-10-CM | POA: Diagnosis not present

## 2022-12-14 DIAGNOSIS — I6521 Occlusion and stenosis of right carotid artery: Secondary | ICD-10-CM | POA: Diagnosis not present

## 2022-12-15 DIAGNOSIS — I69354 Hemiplegia and hemiparesis following cerebral infarction affecting left non-dominant side: Secondary | ICD-10-CM | POA: Diagnosis not present

## 2022-12-15 DIAGNOSIS — I6621 Occlusion and stenosis of right posterior cerebral artery: Secondary | ICD-10-CM | POA: Diagnosis not present

## 2022-12-15 DIAGNOSIS — I69322 Dysarthria following cerebral infarction: Secondary | ICD-10-CM | POA: Diagnosis not present

## 2022-12-15 DIAGNOSIS — I6521 Occlusion and stenosis of right carotid artery: Secondary | ICD-10-CM | POA: Diagnosis not present

## 2022-12-15 DIAGNOSIS — M6389 Disorders of muscle in diseases classified elsewhere, multiple sites: Secondary | ICD-10-CM | POA: Diagnosis not present

## 2022-12-15 DIAGNOSIS — R278 Other lack of coordination: Secondary | ICD-10-CM | POA: Diagnosis not present

## 2022-12-18 DIAGNOSIS — R278 Other lack of coordination: Secondary | ICD-10-CM | POA: Diagnosis not present

## 2022-12-18 DIAGNOSIS — I69354 Hemiplegia and hemiparesis following cerebral infarction affecting left non-dominant side: Secondary | ICD-10-CM | POA: Diagnosis not present

## 2022-12-18 DIAGNOSIS — E860 Dehydration: Secondary | ICD-10-CM | POA: Diagnosis not present

## 2022-12-18 DIAGNOSIS — I6521 Occlusion and stenosis of right carotid artery: Secondary | ICD-10-CM | POA: Diagnosis not present

## 2022-12-18 DIAGNOSIS — Z79899 Other long term (current) drug therapy: Secondary | ICD-10-CM | POA: Diagnosis not present

## 2022-12-18 DIAGNOSIS — I69322 Dysarthria following cerebral infarction: Secondary | ICD-10-CM | POA: Diagnosis not present

## 2022-12-18 DIAGNOSIS — M6389 Disorders of muscle in diseases classified elsewhere, multiple sites: Secondary | ICD-10-CM | POA: Diagnosis not present

## 2022-12-18 DIAGNOSIS — I6621 Occlusion and stenosis of right posterior cerebral artery: Secondary | ICD-10-CM | POA: Diagnosis not present

## 2022-12-18 LAB — CBC AND DIFFERENTIAL
HCT: 26 — AB (ref 36–46)
Hemoglobin: 8.5 — AB (ref 12.0–16.0)
Neutrophils Absolute: 5.4
Platelets: 130 10*3/uL — AB (ref 150–400)
WBC: 9.3

## 2022-12-18 LAB — BASIC METABOLIC PANEL
BUN: 46 — AB (ref 4–21)
CO2: 25 — AB (ref 13–22)
Chloride: 108 (ref 99–108)
Creatinine: 1 (ref 0.5–1.1)
Glucose: 97
Potassium: 4.2 mEq/L (ref 3.5–5.1)
Sodium: 145 (ref 137–147)

## 2022-12-18 LAB — COMPREHENSIVE METABOLIC PANEL
Albumin: 3 — AB (ref 3.5–5.0)
Calcium: 8.8 (ref 8.7–10.7)
eGFR: 51

## 2022-12-18 LAB — HEPATIC FUNCTION PANEL
ALT: 49 U/L — AB (ref 7–35)
AST: 38 — AB (ref 13–35)
Alkaline Phosphatase: 102 (ref 25–125)
Bilirubin, Total: 0.3

## 2022-12-18 LAB — CBC: RBC: 3.06 — AB (ref 3.87–5.11)

## 2022-12-18 NOTE — Progress Notes (Signed)
Remote pacemaker transmission.   

## 2022-12-19 DIAGNOSIS — I6521 Occlusion and stenosis of right carotid artery: Secondary | ICD-10-CM | POA: Diagnosis not present

## 2022-12-19 DIAGNOSIS — I6621 Occlusion and stenosis of right posterior cerebral artery: Secondary | ICD-10-CM | POA: Diagnosis not present

## 2022-12-19 DIAGNOSIS — R278 Other lack of coordination: Secondary | ICD-10-CM | POA: Diagnosis not present

## 2022-12-19 DIAGNOSIS — M6389 Disorders of muscle in diseases classified elsewhere, multiple sites: Secondary | ICD-10-CM | POA: Diagnosis not present

## 2022-12-19 DIAGNOSIS — I69322 Dysarthria following cerebral infarction: Secondary | ICD-10-CM | POA: Diagnosis not present

## 2022-12-19 DIAGNOSIS — I69354 Hemiplegia and hemiparesis following cerebral infarction affecting left non-dominant side: Secondary | ICD-10-CM | POA: Diagnosis not present

## 2022-12-20 DIAGNOSIS — R278 Other lack of coordination: Secondary | ICD-10-CM | POA: Diagnosis not present

## 2022-12-20 DIAGNOSIS — M6389 Disorders of muscle in diseases classified elsewhere, multiple sites: Secondary | ICD-10-CM | POA: Diagnosis not present

## 2022-12-20 DIAGNOSIS — I6621 Occlusion and stenosis of right posterior cerebral artery: Secondary | ICD-10-CM | POA: Diagnosis not present

## 2022-12-20 DIAGNOSIS — I69354 Hemiplegia and hemiparesis following cerebral infarction affecting left non-dominant side: Secondary | ICD-10-CM | POA: Diagnosis not present

## 2022-12-20 DIAGNOSIS — I69322 Dysarthria following cerebral infarction: Secondary | ICD-10-CM | POA: Diagnosis not present

## 2022-12-20 DIAGNOSIS — I6521 Occlusion and stenosis of right carotid artery: Secondary | ICD-10-CM | POA: Diagnosis not present

## 2022-12-21 DIAGNOSIS — R278 Other lack of coordination: Secondary | ICD-10-CM | POA: Diagnosis not present

## 2022-12-21 DIAGNOSIS — I6621 Occlusion and stenosis of right posterior cerebral artery: Secondary | ICD-10-CM | POA: Diagnosis not present

## 2022-12-21 DIAGNOSIS — I69322 Dysarthria following cerebral infarction: Secondary | ICD-10-CM | POA: Diagnosis not present

## 2022-12-21 DIAGNOSIS — I69354 Hemiplegia and hemiparesis following cerebral infarction affecting left non-dominant side: Secondary | ICD-10-CM | POA: Diagnosis not present

## 2022-12-21 DIAGNOSIS — I6521 Occlusion and stenosis of right carotid artery: Secondary | ICD-10-CM | POA: Diagnosis not present

## 2022-12-21 DIAGNOSIS — M6389 Disorders of muscle in diseases classified elsewhere, multiple sites: Secondary | ICD-10-CM | POA: Diagnosis not present

## 2022-12-22 DIAGNOSIS — M6389 Disorders of muscle in diseases classified elsewhere, multiple sites: Secondary | ICD-10-CM | POA: Diagnosis not present

## 2022-12-22 DIAGNOSIS — I69354 Hemiplegia and hemiparesis following cerebral infarction affecting left non-dominant side: Secondary | ICD-10-CM | POA: Diagnosis not present

## 2022-12-22 DIAGNOSIS — I69322 Dysarthria following cerebral infarction: Secondary | ICD-10-CM | POA: Diagnosis not present

## 2022-12-22 DIAGNOSIS — I6621 Occlusion and stenosis of right posterior cerebral artery: Secondary | ICD-10-CM | POA: Diagnosis not present

## 2022-12-22 DIAGNOSIS — I6521 Occlusion and stenosis of right carotid artery: Secondary | ICD-10-CM | POA: Diagnosis not present

## 2022-12-22 DIAGNOSIS — R278 Other lack of coordination: Secondary | ICD-10-CM | POA: Diagnosis not present

## 2022-12-25 DIAGNOSIS — I6521 Occlusion and stenosis of right carotid artery: Secondary | ICD-10-CM | POA: Diagnosis not present

## 2022-12-25 DIAGNOSIS — I6621 Occlusion and stenosis of right posterior cerebral artery: Secondary | ICD-10-CM | POA: Diagnosis not present

## 2022-12-25 DIAGNOSIS — I69322 Dysarthria following cerebral infarction: Secondary | ICD-10-CM | POA: Diagnosis not present

## 2022-12-25 DIAGNOSIS — R278 Other lack of coordination: Secondary | ICD-10-CM | POA: Diagnosis not present

## 2022-12-25 DIAGNOSIS — M6389 Disorders of muscle in diseases classified elsewhere, multiple sites: Secondary | ICD-10-CM | POA: Diagnosis not present

## 2022-12-25 DIAGNOSIS — I69354 Hemiplegia and hemiparesis following cerebral infarction affecting left non-dominant side: Secondary | ICD-10-CM | POA: Diagnosis not present

## 2022-12-26 DIAGNOSIS — I6521 Occlusion and stenosis of right carotid artery: Secondary | ICD-10-CM | POA: Diagnosis not present

## 2022-12-26 DIAGNOSIS — I69354 Hemiplegia and hemiparesis following cerebral infarction affecting left non-dominant side: Secondary | ICD-10-CM | POA: Diagnosis not present

## 2022-12-26 DIAGNOSIS — M6389 Disorders of muscle in diseases classified elsewhere, multiple sites: Secondary | ICD-10-CM | POA: Diagnosis not present

## 2022-12-26 DIAGNOSIS — R278 Other lack of coordination: Secondary | ICD-10-CM | POA: Diagnosis not present

## 2022-12-26 DIAGNOSIS — I6621 Occlusion and stenosis of right posterior cerebral artery: Secondary | ICD-10-CM | POA: Diagnosis not present

## 2022-12-26 DIAGNOSIS — I69322 Dysarthria following cerebral infarction: Secondary | ICD-10-CM | POA: Diagnosis not present

## 2022-12-27 DIAGNOSIS — R278 Other lack of coordination: Secondary | ICD-10-CM | POA: Diagnosis not present

## 2022-12-27 DIAGNOSIS — M6389 Disorders of muscle in diseases classified elsewhere, multiple sites: Secondary | ICD-10-CM | POA: Diagnosis not present

## 2022-12-27 DIAGNOSIS — I6521 Occlusion and stenosis of right carotid artery: Secondary | ICD-10-CM | POA: Diagnosis not present

## 2022-12-27 DIAGNOSIS — I69354 Hemiplegia and hemiparesis following cerebral infarction affecting left non-dominant side: Secondary | ICD-10-CM | POA: Diagnosis not present

## 2022-12-27 DIAGNOSIS — I6621 Occlusion and stenosis of right posterior cerebral artery: Secondary | ICD-10-CM | POA: Diagnosis not present

## 2022-12-27 DIAGNOSIS — I69322 Dysarthria following cerebral infarction: Secondary | ICD-10-CM | POA: Diagnosis not present

## 2022-12-28 DIAGNOSIS — M6389 Disorders of muscle in diseases classified elsewhere, multiple sites: Secondary | ICD-10-CM | POA: Diagnosis not present

## 2022-12-28 DIAGNOSIS — I6621 Occlusion and stenosis of right posterior cerebral artery: Secondary | ICD-10-CM | POA: Diagnosis not present

## 2022-12-28 DIAGNOSIS — I69354 Hemiplegia and hemiparesis following cerebral infarction affecting left non-dominant side: Secondary | ICD-10-CM | POA: Diagnosis not present

## 2022-12-28 DIAGNOSIS — I6521 Occlusion and stenosis of right carotid artery: Secondary | ICD-10-CM | POA: Diagnosis not present

## 2022-12-28 DIAGNOSIS — I69322 Dysarthria following cerebral infarction: Secondary | ICD-10-CM | POA: Diagnosis not present

## 2022-12-28 DIAGNOSIS — R278 Other lack of coordination: Secondary | ICD-10-CM | POA: Diagnosis not present

## 2022-12-28 NOTE — Telephone Encounter (Signed)
A user error has taken place.

## 2022-12-29 DIAGNOSIS — I69322 Dysarthria following cerebral infarction: Secondary | ICD-10-CM | POA: Diagnosis not present

## 2022-12-29 DIAGNOSIS — I6621 Occlusion and stenosis of right posterior cerebral artery: Secondary | ICD-10-CM | POA: Diagnosis not present

## 2022-12-29 DIAGNOSIS — I69354 Hemiplegia and hemiparesis following cerebral infarction affecting left non-dominant side: Secondary | ICD-10-CM | POA: Diagnosis not present

## 2022-12-29 DIAGNOSIS — I6521 Occlusion and stenosis of right carotid artery: Secondary | ICD-10-CM | POA: Diagnosis not present

## 2022-12-29 DIAGNOSIS — M6389 Disorders of muscle in diseases classified elsewhere, multiple sites: Secondary | ICD-10-CM | POA: Diagnosis not present

## 2022-12-29 DIAGNOSIS — R278 Other lack of coordination: Secondary | ICD-10-CM | POA: Diagnosis not present

## 2022-12-31 DIAGNOSIS — I6521 Occlusion and stenosis of right carotid artery: Secondary | ICD-10-CM | POA: Diagnosis not present

## 2022-12-31 DIAGNOSIS — I69354 Hemiplegia and hemiparesis following cerebral infarction affecting left non-dominant side: Secondary | ICD-10-CM | POA: Diagnosis not present

## 2022-12-31 DIAGNOSIS — I69322 Dysarthria following cerebral infarction: Secondary | ICD-10-CM | POA: Diagnosis not present

## 2022-12-31 DIAGNOSIS — M6389 Disorders of muscle in diseases classified elsewhere, multiple sites: Secondary | ICD-10-CM | POA: Diagnosis not present

## 2022-12-31 DIAGNOSIS — I6621 Occlusion and stenosis of right posterior cerebral artery: Secondary | ICD-10-CM | POA: Diagnosis not present

## 2022-12-31 DIAGNOSIS — R278 Other lack of coordination: Secondary | ICD-10-CM | POA: Diagnosis not present

## 2023-01-01 DIAGNOSIS — I6521 Occlusion and stenosis of right carotid artery: Secondary | ICD-10-CM | POA: Diagnosis not present

## 2023-01-01 DIAGNOSIS — R278 Other lack of coordination: Secondary | ICD-10-CM | POA: Diagnosis not present

## 2023-01-01 DIAGNOSIS — I69354 Hemiplegia and hemiparesis following cerebral infarction affecting left non-dominant side: Secondary | ICD-10-CM | POA: Diagnosis not present

## 2023-01-01 DIAGNOSIS — I69322 Dysarthria following cerebral infarction: Secondary | ICD-10-CM | POA: Diagnosis not present

## 2023-01-01 DIAGNOSIS — I6621 Occlusion and stenosis of right posterior cerebral artery: Secondary | ICD-10-CM | POA: Diagnosis not present

## 2023-01-01 DIAGNOSIS — M6389 Disorders of muscle in diseases classified elsewhere, multiple sites: Secondary | ICD-10-CM | POA: Diagnosis not present

## 2023-01-02 DIAGNOSIS — E860 Dehydration: Secondary | ICD-10-CM | POA: Diagnosis not present

## 2023-01-02 DIAGNOSIS — M6389 Disorders of muscle in diseases classified elsewhere, multiple sites: Secondary | ICD-10-CM | POA: Diagnosis not present

## 2023-01-02 DIAGNOSIS — I6521 Occlusion and stenosis of right carotid artery: Secondary | ICD-10-CM | POA: Diagnosis not present

## 2023-01-02 DIAGNOSIS — I69354 Hemiplegia and hemiparesis following cerebral infarction affecting left non-dominant side: Secondary | ICD-10-CM | POA: Diagnosis not present

## 2023-01-02 DIAGNOSIS — I6621 Occlusion and stenosis of right posterior cerebral artery: Secondary | ICD-10-CM | POA: Diagnosis not present

## 2023-01-02 DIAGNOSIS — E039 Hypothyroidism, unspecified: Secondary | ICD-10-CM | POA: Diagnosis not present

## 2023-01-02 DIAGNOSIS — I69322 Dysarthria following cerebral infarction: Secondary | ICD-10-CM | POA: Diagnosis not present

## 2023-01-02 DIAGNOSIS — R278 Other lack of coordination: Secondary | ICD-10-CM | POA: Diagnosis not present

## 2023-01-02 LAB — CBC AND DIFFERENTIAL
HCT: 27 — AB (ref 36–46)
Hemoglobin: 8.8 — AB (ref 12.0–16.0)
Platelets: 162 10*3/uL (ref 150–400)
WBC: 7

## 2023-01-02 LAB — BASIC METABOLIC PANEL
BUN: 38 — AB (ref 4–21)
CO2: 23 — AB (ref 13–22)
Chloride: 108 (ref 99–108)
Creatinine: 1.1 (ref 0.5–1.1)
Glucose: 95
Potassium: 4 mEq/L (ref 3.5–5.1)
Sodium: 141 (ref 137–147)

## 2023-01-02 LAB — HEPATIC FUNCTION PANEL
ALT: 21 U/L (ref 7–35)
AST: 21 (ref 13–35)
Alkaline Phosphatase: 80 (ref 25–125)
Bilirubin, Total: 0.3

## 2023-01-02 LAB — COMPREHENSIVE METABOLIC PANEL
Albumin: 3.1 — AB (ref 3.5–5.0)
Calcium: 8.7 (ref 8.7–10.7)
Globulin: 3.2
eGFR: 47

## 2023-01-02 LAB — CBC: RBC: 3.11 — AB (ref 3.87–5.11)

## 2023-01-03 DIAGNOSIS — I6621 Occlusion and stenosis of right posterior cerebral artery: Secondary | ICD-10-CM | POA: Diagnosis not present

## 2023-01-03 DIAGNOSIS — I69354 Hemiplegia and hemiparesis following cerebral infarction affecting left non-dominant side: Secondary | ICD-10-CM | POA: Diagnosis not present

## 2023-01-03 DIAGNOSIS — M6389 Disorders of muscle in diseases classified elsewhere, multiple sites: Secondary | ICD-10-CM | POA: Diagnosis not present

## 2023-01-03 DIAGNOSIS — R278 Other lack of coordination: Secondary | ICD-10-CM | POA: Diagnosis not present

## 2023-01-03 DIAGNOSIS — I6521 Occlusion and stenosis of right carotid artery: Secondary | ICD-10-CM | POA: Diagnosis not present

## 2023-01-03 DIAGNOSIS — I69322 Dysarthria following cerebral infarction: Secondary | ICD-10-CM | POA: Diagnosis not present

## 2023-01-04 ENCOUNTER — Non-Acute Institutional Stay (SKILLED_NURSING_FACILITY): Payer: Medicare Other | Admitting: Adult Health

## 2023-01-04 ENCOUNTER — Encounter: Payer: Self-pay | Admitting: Adult Health

## 2023-01-04 DIAGNOSIS — I4821 Permanent atrial fibrillation: Secondary | ICD-10-CM

## 2023-01-04 DIAGNOSIS — Z7189 Other specified counseling: Secondary | ICD-10-CM

## 2023-01-04 DIAGNOSIS — R1312 Dysphagia, oropharyngeal phase: Secondary | ICD-10-CM | POA: Diagnosis not present

## 2023-01-04 DIAGNOSIS — D509 Iron deficiency anemia, unspecified: Secondary | ICD-10-CM | POA: Diagnosis not present

## 2023-01-04 DIAGNOSIS — I6521 Occlusion and stenosis of right carotid artery: Secondary | ICD-10-CM | POA: Diagnosis not present

## 2023-01-04 DIAGNOSIS — R634 Abnormal weight loss: Secondary | ICD-10-CM

## 2023-01-04 DIAGNOSIS — I69354 Hemiplegia and hemiparesis following cerebral infarction affecting left non-dominant side: Secondary | ICD-10-CM | POA: Diagnosis not present

## 2023-01-04 DIAGNOSIS — I6621 Occlusion and stenosis of right posterior cerebral artery: Secondary | ICD-10-CM | POA: Diagnosis not present

## 2023-01-04 DIAGNOSIS — R278 Other lack of coordination: Secondary | ICD-10-CM | POA: Diagnosis not present

## 2023-01-04 DIAGNOSIS — M6389 Disorders of muscle in diseases classified elsewhere, multiple sites: Secondary | ICD-10-CM | POA: Diagnosis not present

## 2023-01-04 DIAGNOSIS — S91309A Unspecified open wound, unspecified foot, initial encounter: Secondary | ICD-10-CM

## 2023-01-04 DIAGNOSIS — I69322 Dysarthria following cerebral infarction: Secondary | ICD-10-CM | POA: Diagnosis not present

## 2023-01-04 NOTE — Progress Notes (Signed)
Location:  Occupational psychologist of Service:  SNF (31) Provider:   Cindi Carbon, Kelly [phone]   [name], MD   Extended Emergency Contact Information Primary Emergency Contact: [name] of [place] Phone: [phone] Mobile Phone: [phone] Relation: Daughter Secondary Emergency Contact: [name],[name]  [name] of [place] Phone: [phone] Mobile Phone: [phone] Relation: Son  Code Status:  DNR Goals of care: Advanced Directive information    12/12/2022    3:53 PM  Advanced Directives  Does Patient Have a Medical Advance Directive? Yes  Type of Paramedic of [place];Living will;Out of facility DNR (pink MOST or yellow form)  Does patient want to make changes to medical advance directive? No - Patient declined  Copy of [place] in Chart? Yes - validated most recent copy scanned in chart (See row information)  Pre-existing out of facility DNR order (yellow form or pink MOST form) Pink MOST/Yellow Form most recent copy in chart - Physician notified to receive inpatient order     Chief Complaint  Patient presents with   Medical Management of Chronic Issues    HPI:  Pt is a 89 y.o. female seen today for medical management of chronic diseases.   [id number] and discharged on 10/02/22 to skilled care diagnosed with a right large PCA infarct s/p TNK . She presented with dysarthria, left facial droop and right gaze preference.  She continues with symptoms of left sided neglect, weakness, dysphagia.  Has a hx of prior CVA as well along with afib, pacemaker, HTN, bakers cysts with bleeding, allergic rhinitis, back pain, CAD, and OP  Dysphagia: per ST she has not made progress in her swallow function. Still requires puree food and HTL  Left sided neglect: not able to make progress in therapy. Uses a hoyer lift for transfers Very alert and  able to make her needs known for my visit today.  Reports the left side of her body can be painful at times. Doesn't affect her sleep Denies feeling depressed or anxious. Sleeps well. Very positive outlook.  Weight has gone up by a few lbs after a period of weight loss Required IVF x 2 for dehydration this month BUN trended down 34.7 01/02/23  Hgb remains low 8.8 but unchanged. Had declined oral iron Does not feel fatigued or sob.   Wounds to BLE extremities are healing, followed by wound care and changed on Monday.   Afib: rate is controlled, not on DOAC due to prior bleeding risk with bakers cysts, nose bleeds etc  BP controlled  Has pacemaker   .   Past Medical History:  Diagnosis Date   Actinic keratosis 05/25/2014   Anxiety    Atrial fibrillation ([place]) 09/21/2010   Basal cell carcinoma 05/25/2014   Multiple removed by Dr. Danella Sensing in March 2015: right neck, left neck, scalp    Bell's palsy 07/18/1979   Cervicalgia 01/31/2012   Closed fracture of lumbar vertebra without mention of spinal cord injury 07/17/2005   Conjunctiva disorder 12/26/2010   Coronary atherosclerosis of native coronary artery 07/17/1997   Cramp of limb 08/16/2009   Degeneration of lumbar or lumbosacral intervertebral disc 01/31/2012   Disturbance of skin sensation 08/2009   Dizziness and giddiness 11/08/2011   vertigo   External hemorrhoids without mention of complication [id number]   External hemorrhoids without mention of complication [id number]   Ganglion of tendon sheath 08/16/2009   Leg cramp 10/27/2013  Most frequently the right leg.    Long term (current) use of anticoagulants 09/2010   Lumbago 07/2009   Meralgia paresthetica 07/2007   MI, old    Myalgia and myositis, unspecified 01/17/2012   Osteoporosis    Other abnormal blood chemistry 04/08/2012   Other abnormal blood chemistry 2013   hyperglycemia   Other disorder of muscle, ligament, and fascia 04/08/2012   Other specified  cardiac dysrhythmias(427.89) 06/27/2010   Pacemaker 05/06/2020   Pain in joint, ankle and foot 10/27/2013   Bilateral since 1998    Pain in joint, shoulder region 08/12/2012   Pain in joint, upper arm 12/26/2010   Pain in limb 01/11/2011   Pain in thoracic spine 01/31/2012   Pathologic fracture of vertebrae 01/22/2012   PVC's (premature ventricular contractions)    Rash and other nonspecific skin eruption 08/02/2011   Senile osteoporosis 07/17/1993   Stroke ([place])    Unspecified essential hypertension 07/17/1997   Varicose veins of lower extremities 08/16/2009   Varicose veins of lower extremities 08/16/2009   Past Surgical History:  Procedure Laterality Date   COLONOSCOPY WITH PROPOFOL N/A 10/19/2015   Procedure: COLONOSCOPY WITH PROPOFOL;  Surgeon: Gatha Mayer, MD;  Location: [facility];  Service: Endoscopy;  Laterality: N/A;   CORONARY ARTERY BYPASS GRAFT  1998   x2; LIMA to LAD; SVG to diagonal off bypass   HEMORRHOID SURGERY  08/26/2012   Dr. Brantley Stage   LOOP RECORDER INSERTION N/A 05/13/2018   Procedure: LOOP RECORDER INSERTION;  Surgeon: [name], MD;  Location: [place] [facility];  Service: Cardiovascular;  Laterality: N/A;   LOOP RECORDER REMOVAL N/A 05/26/2019   Procedure: LOOP RECORDER REMOVAL;  Surgeon: [name], MD;  Location: [place] [facility];  Service: Cardiovascular;  Laterality: N/A;   MASS EXCISION Left 11/23/2015   Procedure: LEFT WRIST EXCISION CYST;  Surgeon: Leanora Cover, MD;  Location: [place];  Service: Orthopedics;  Laterality: Left;   PACEMAKER IMPLANT N/A 05/26/2019   Procedure: PACEMAKER IMPLANT;  Surgeon: [name], MD;  Location: [place] [facility];  Service: Cardiovascular;  Laterality: N/A;   SKIN BIOPSY  01/29/14   (R) neck; (R) scalp, 2 (L) neck; shave biopsy Superficial basal cell carcinoma Dr. Danella Sensing   TONSILLECTOMY  [phone]    Allergies  Allergen Reactions   Iodinated Contrast Media Nausea Only and  Other (See Comments)    Severe nausea and also passed out   Iodine Nausea Only and Other (See Comments)    "Allergic," per [facility]- PASSED OUT   Lodine [Etodolac] Other (See Comments)    "Allergic," per [facility]   Other Other (See Comments)    Seasonal Allergies   Bactrim Rash   Relafen [Nabumetone] Rash   Sulfamethoxazole-Trimethoprim Rash    In matrix   Sulfanilamide Rash    Outpatient Encounter Medications as of 01/04/2023  Medication Sig   acetaminophen (TYLENOL) 325 MG tablet Take 650 mg by mouth 3 (three) times daily as needed (for pain).   amLODipine (NORVASC) 5 MG tablet Take 5 mg by mouth daily.   Artificial Saliva (BIOTENE DRY MOUTH MOISTURIZING) SOLN Use as directed 1-2 sprays in the mouth or throat 3 (three) times daily.   aspirin 81 MG chewable tablet Chew 1 tablet (81 mg total) by mouth daily.   atenolol (TENORMIN) 50 MG tablet Take 1 tablet (50 mg total) by mouth 2 (two) times daily.   Calcium Carb-Cholecalciferol (CALCIUM 500 + D3 PO) Take 1  capsule by mouth in the morning.   diclofenac Sodium (VOLTAREN) 1 % GEL Apply 2 g topically as needed (BID PRN).   digoxin (LANOXIN) 0.125 MG tablet Take 1 tablet (0.125 mg total) by mouth every other day.   docusate (COLACE) 50 MG/5ML liquid Take 100 mg by mouth daily.   hydrALAZINE (APRESOLINE) 10 MG tablet Take 10 mg by mouth 3 (three) times daily as needed (SBP>180).   levocetirizine (XYZAL) 5 MG tablet Take 5 mg by mouth at bedtime.   losartan (COZAAR) 25 MG tablet Take 25 mg by mouth daily.   polyethylene glycol (MIRALAX / GLYCOLAX) 17 g packet Take 17 g by mouth See admin instructions. Mix 17 grams of powder into 4-6 oz of beverage of choice and drink once a day   sodium chloride (OCEAN) 0.65 % nasal spray Place 1 spray into the nose 4 (four) times daily as needed for congestion.   [DISCONTINUED] atorvastatin (LIPITOR) 40 MG tablet Take 1 tablet (40 mg total) by mouth daily. (Patient not taking: Reported on 12/12/2022)   [DISCONTINUED]  ferrous sulfate 300 (60 Fe) MG/5ML syrup Take 5 mLs (300 mg total) by mouth 3 (three) times a week. (Patient not taking: Reported on 12/11/2022)   No facility-administered encounter medications on file as of 01/04/2023.    Review of Systems  Constitutional:  Negative for activity change, appetite change, chills, diaphoresis, fatigue and fever.  HENT:  Positive for rhinorrhea and trouble swallowing. Negative for congestion.   Respiratory:  Negative for cough, shortness of breath and wheezing.        Throat clearing, wet vocal quality   Cardiovascular:  Negative for chest pain, palpitations and leg swelling.  Gastrointestinal:  Negative for abdominal distention, abdominal pain, constipation and diarrhea.  Genitourinary:  Negative for difficulty urinating and dysuria.  Musculoskeletal:  Positive for gait problem. Negative for arthralgias, back pain, joint swelling and myalgias.       Pain to left side of body  Skin:  Positive for wound.  Neurological:  Positive for speech difficulty and weakness. Negative for dizziness, tremors, seizures, syncope, facial asymmetry, light-headedness, numbness and headaches.  Psychiatric/Behavioral:  Negative for agitation, behavioral problems, confusion, dysphoric mood and sleep disturbance.     Immunization History  Administered Date(s) Administered   Influenza Whole 08/06/2012   Influenza, High Dose Seasonal PF 08/15/2019, 08/27/2020   Influenza,inj,Quad PF,6+ Mos 08/30/2018   Influenza-Unspecified 08/06/2013, 08/24/2014, 08/26/2015, 08/31/2016, 08/27/2017, 08/27/2020   Moderna Sars-Covid-2 Vaccination 11/16/2019, 12/16/2019, 09/16/2020   Pneumococcal Conjugate-13 09/22/2015   Pneumococcal Polysaccharide-23 08/29/2006   Td 10/07/2007   Zoster Recombinat (Shingrix) 02/12/2018, 05/13/2018   Zoster, Live 03/12/2008   Pertinent  Health Maintenance Due  Topic Date Due   INFLUENZA VACCINE  02/04/2023 (Originally 06/06/2022)   DEXA SCAN  Completed       10/02/2022    8:00 AM 10/03/2022   10:49 AM 10/04/2022    9:50 AM 10/06/2022   10:31 AM 12/01/2022    9:28 AM  Fall Risk  Falls in the past year?  '1 1 1 1  '$ Was there an injury with Fall?  '1 1 1 1  '$ Fall Risk Category Calculator  '3 3 2 2  '$ Fall Risk Category (Retired)  High High Moderate   (RETIRED) Patient Fall Risk Level High fall risk High fall risk High fall risk High fall risk   Patient at Risk for Falls Due to  History of fall(s) History of fall(s) History of fall(s) History of fall(s)  Fall risk  Follow up  Falls evaluation completed Falls evaluation completed Falls evaluation completed Falls evaluation completed   Functional Status Survey:    Vitals:   01/04/23 1428  BP: (!) 141/81  Pulse: 80  Resp: 20  SpO2: 92%  Weight: 126 lb 9.6 oz (57.4 kg)   Body mass index is 20.43 kg/m. Physical Exam Vitals and nursing note reviewed.  Constitutional:      General: She is not in acute distress.    Appearance: She is not diaphoretic.  HENT:     Head: Normocephalic and atraumatic.     Mouth/Throat:     Mouth: Mucous membranes are moist.     Pharynx: Oropharynx is clear.  Neck:     Vascular: No JVD.  Cardiovascular:     Rate and Rhythm: Normal rate. Rhythm irregular.     Heart sounds: No murmur heard. Pulmonary:     Effort: Pulmonary effort is normal. No respiratory distress.     Breath sounds: Normal breath sounds. No wheezing.  Skin:    General: Skin is warm and dry.  Neurological:     Mental Status: She is alert.     Comments: Oriented to self and place Able to f/c Has left sided hemiplegia.      Labs reviewed: Recent Labs    09/30/22 0931 10/01/22 0806 10/02/22 0900 10/21/22 0000 10/31/22 0000 12/07/22 0000 12/11/22 0000  NA 141 142 142   < > 137 139 144  K 3.6 3.3* 3.9   < > 3.3* 4.0 3.9  CL 108 111 116*   < > 99 104 108  CO2 22 20* 19*   < > 25* 21 22  GLUCOSE 107* 114* 108*  --   --   --   --   BUN 34* 36* 35*   < > 38* 82* 59*  CREATININE 1.25*  1.21* 1.09*   < > 1.1 1.3* 1.1  CALCIUM 8.5* 8.2* 8.2*   < > 8.6* 8.9 8.8   < > = values in this interval not displayed.    Recent Labs    09/26/22 1539 10/21/22 0000 10/31/22 0000 12/07/22 0000 12/11/22 0000  AST 17   < > 49* 58* 60*  ALT 12   < > 52* 70* 73*  ALKPHOS 71   < > 85 99 94  BILITOT 0.3  --   --   --   --   PROT 7.1  --   --   --   --   ALBUMIN 3.1*   < > 3.0* 3.1* 2.9*   < > = values in this interval not displayed.    Recent Labs    09/30/22 0931 10/01/22 0806 10/02/22 0900 10/09/22 0000 10/12/22 0000 10/16/22 0000 10/20/22 0000 10/31/22 0000 12/08/22 0000  WBC 10.7* 11.0* 9.9 18.8 17.7 15.1 15.2 11.4 10.4  NEUTROABS  --   --   --  11.10 11.60 9.40  --   --   --   HGB 9.2* 8.5* 8.8* 9.3* 9.7* 8.6* 9.0* 8.2* 8.3*  HCT 28.9* 25.7* 27.2* 29* 30* 27* 28* 25* 25*  MCV 88.1 87.1 86.6  --   --   --   --   --   --   PLT 96* 87* 109* 179 210 217 187 103* 187    Lab Results  Component Value Date   TSH 2.87 02/03/2022   Lab Results  Component Value Date   HGBA1C 5.7 (H) 09/26/2022   Lab Results  Component  Value Date   CHOL 161 09/27/2022   HDL 39 (L) 09/27/2022   LDLCALC 114 (H) 09/27/2022   LDLDIRECT 81.3 08/19/2020   TRIG 39 09/27/2022   CHOLHDL 4.1 09/27/2022    Significant Diagnostic Results in last 30 days:  No results found.  Assessment/Plan  1. 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   2. Oropharyngeal dysphagia Has not been able to advance her diet On puree diet with HTL No signs of acute infection at this time.   3. Iron deficiency anemia, unspecified iron deficiency anemia type Discussed trying lower dose iron vs MVI with iron. Will see what the pharmacy has to offer [place], possible a liquid MVI with iron. This would be a smaller amt of iron than  recommended but would be more palatable.   4. Weight loss Did gain weight since her last visit  Off prostat, will have Claiborne Billings with dietary f/u with her daughter about why the prostat is no longer on her med list and her current protein needs.   5. Permanent atrial fibrillation (HCC) Rate is controlled, on atenolol Not on DOAC due to prior bleeding issues  6. Multiple open wounds of foot Healing per wound care Dressing changes on Monday Will f/u   7. Advanced care planning/discussion I had a discussion with [name], resident's daughter. Offered hospice care but they are not inclined to accept at this time We found a most form in epic but it is several years old and needs updating. At this time Ms. Carolla would like treatment with IVF if she becomes dehydrated again. She clearly stated this in our visit. She has a DNR. Her daughter expressed that if she had another stroke she would like her to go back to the hospital. If it were obvious that she was dying she would like her to pass in her room here at [facility]. Ms. Mainwaring perceives that she has a quality of life and does not appear depressed at this time. We will continue to focus on her quality of life, discussed quantity vs quality in the visit. She appears to be content at this time. Also her legal papers living will/HCPOA need updating. [name] will help provide these. A new most form will be provided by [facility].    Family/ staff Communication: [name] daughter POA  Labs/tests ordered:  CMP and CBC in one month   Total time 40 min:  time greater than 50% of total time spent doing pt counseling and coordination of care

## 2023-01-05 DIAGNOSIS — I6521 Occlusion and stenosis of right carotid artery: Secondary | ICD-10-CM | POA: Diagnosis not present

## 2023-01-05 DIAGNOSIS — I6621 Occlusion and stenosis of right posterior cerebral artery: Secondary | ICD-10-CM | POA: Diagnosis not present

## 2023-01-05 DIAGNOSIS — R1312 Dysphagia, oropharyngeal phase: Secondary | ICD-10-CM | POA: Diagnosis not present

## 2023-01-05 DIAGNOSIS — I69322 Dysarthria following cerebral infarction: Secondary | ICD-10-CM | POA: Diagnosis not present

## 2023-01-05 DIAGNOSIS — R278 Other lack of coordination: Secondary | ICD-10-CM | POA: Diagnosis not present

## 2023-01-05 DIAGNOSIS — I69354 Hemiplegia and hemiparesis following cerebral infarction affecting left non-dominant side: Secondary | ICD-10-CM | POA: Diagnosis not present

## 2023-01-05 DIAGNOSIS — I69391 Dysphagia following cerebral infarction: Secondary | ICD-10-CM | POA: Diagnosis not present

## 2023-01-05 DIAGNOSIS — M6389 Disorders of muscle in diseases classified elsewhere, multiple sites: Secondary | ICD-10-CM | POA: Diagnosis not present

## 2023-01-05 NOTE — Telephone Encounter (Signed)
Message routed

## 2023-01-08 ENCOUNTER — Non-Acute Institutional Stay (SKILLED_NURSING_FACILITY): Payer: Medicare Other | Admitting: Internal Medicine

## 2023-01-08 ENCOUNTER — Encounter: Payer: Self-pay | Admitting: Internal Medicine

## 2023-01-08 DIAGNOSIS — M6389 Disorders of muscle in diseases classified elsewhere, multiple sites: Secondary | ICD-10-CM | POA: Diagnosis not present

## 2023-01-08 DIAGNOSIS — I4821 Permanent atrial fibrillation: Secondary | ICD-10-CM | POA: Diagnosis not present

## 2023-01-08 DIAGNOSIS — I6621 Occlusion and stenosis of right posterior cerebral artery: Secondary | ICD-10-CM | POA: Diagnosis not present

## 2023-01-08 DIAGNOSIS — I69322 Dysarthria following cerebral infarction: Secondary | ICD-10-CM | POA: Diagnosis not present

## 2023-01-08 DIAGNOSIS — R1312 Dysphagia, oropharyngeal phase: Secondary | ICD-10-CM

## 2023-01-08 DIAGNOSIS — M79605 Pain in left leg: Secondary | ICD-10-CM

## 2023-01-08 DIAGNOSIS — R278 Other lack of coordination: Secondary | ICD-10-CM | POA: Diagnosis not present

## 2023-01-08 DIAGNOSIS — R404 Transient alteration of awareness: Secondary | ICD-10-CM | POA: Diagnosis not present

## 2023-01-08 DIAGNOSIS — I69354 Hemiplegia and hemiparesis following cerebral infarction affecting left non-dominant side: Secondary | ICD-10-CM | POA: Diagnosis not present

## 2023-01-08 DIAGNOSIS — I6521 Occlusion and stenosis of right carotid artery: Secondary | ICD-10-CM | POA: Diagnosis not present

## 2023-01-08 NOTE — Progress Notes (Signed)
Location:  [facility] Room Number: 118A Place of Service:  SNF [phone]) Provider:  Mahlon Gammon, MD   Mahlon Gammon, MD  Patient Care Team: Mahlon Gammon, MD as PCP - General (Internal Medicine) Marinus Maw, MD as PCP - Electrophysiology (Cardiology) Mathews Robinsons, MD as Consulting Physician (Dermatology) Marinus Maw, MD as Consulting Physician (Cardiology) [facility] Jeanella Craze, MD as Consulting Physician (Dermatology)  Extended Emergency Contact Information Primary Emergency Contact: Lynnell Grain of [place] Home Phone: [phone] Mobile Phone: [phone] Relation: Daughter Secondary Emergency Contact: Claud Kelp of [place] Home Phone: [phone] Mobile Phone: [phone] Relation: Son  Code Status:  DNR Goals of care: Advanced Directive information    01/08/2023   10:23 AM  Advanced Directives  Does Patient Have a Medical Advance Directive? Yes  Type of Estate agent of [place];Living will;Out of facility DNR (pink MOST or yellow form)  Copy of Healthcare Power of Attorney in Chart? Yes - validated most recent copy scanned in chart (See row information)  Pre-existing out of facility DNR order (yellow form or pink MOST form) Pink MOST/Yellow Form most recent copy in chart - Physician notified to receive inpatient order     Chief Complaint  Patient presents with   Acute Visit    Patient is being seen for a acute Visit   Immunizations    Discussed the need for Tdap vaccine    Quality Metric Gaps    Disucssed the need for AWV    HPI:  Pt is a 89 y.o. female seen today for an acute visit for Episode of Unresponsiveness  Lives in SNF in [place]   Admitted in the hospital from 11/21-11/27 for 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, likely embolic given afib not on AC with Left Hemiplegia and Neglect, Dysphagia,  Still only on Aspirin  She  had episode yesterday when she was sitting in her geri chair in the lunch when she became Unresponsive. Looked distant  Head was limp and eye rolled up No Seizures noticed Vitals were all good Daughter did not want her to go to ED at that time. She stayed like this till they took her to the room Put her  in the bed Daughter came and thinks patient looked different but then in some time she came back to her baseline. 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 Arminda Resides in March 2015: right neck, left neck, scalp    Bell's palsy 07/18/1979   Cervicalgia 01/31/2012   Closed fracture of lumbar vertebra without mention of spinal cord injury 07/17/2005   Conjunctiva disorder 12/26/2010   Coronary atherosclerosis  of native coronary artery 07/17/1997   Cramp of limb 08/16/2009   Degeneration of lumbar or lumbosacral intervertebral disc 01/31/2012   Disturbance of skin sensation 08/2009   Dizziness and giddiness 11/08/2011   vertigo   External hemorrhoids without mention of complication 06/27/2010   External hemorrhoids without mention of complication 08/26/2012   Ganglion of tendon sheath 08/16/2009   Leg cramp 10/27/2013   Most  frequently the right leg.    Long term (current) use of anticoagulants 09/2010   Lumbago 07/2009   Meralgia paresthetica 07/2007   MI, old    Myalgia and myositis, unspecified 01/17/2012   Osteoporosis    Other abnormal blood chemistry 04/08/2012   Other abnormal blood chemistry 2013   hyperglycemia   Other disorder of muscle, ligament, and fascia 04/08/2012   Other specified cardiac dysrhythmias(427.89) 06/27/2010   Pacemaker 05/06/2020   Pain in joint, ankle and foot 10/27/2013   Bilateral since 1998    Pain in joint, shoulder region 08/12/2012   Pain in joint, upper arm 12/26/2010   Pain in limb 01/11/2011   Pain in thoracic spine 01/31/2012   Pathologic fracture of vertebrae 01/22/2012   PVC's (premature ventricular contractions)    Rash and other nonspecific skin eruption 08/02/2011   Senile osteoporosis 07/17/1993   Stroke (HCC)    Unspecified essential hypertension 07/17/1997   Varicose veins of lower extremities 08/16/2009   Varicose veins of lower extremities 08/16/2009   Past Surgical History:  Procedure Laterality Date   COLONOSCOPY WITH PROPOFOL N/A 10/19/2015   Procedure: COLONOSCOPY WITH PROPOFOL;  Surgeon: Iva Boop, MD;  Location: [facility];  Service: Endoscopy;  Laterality: N/A;   CORONARY ARTERY BYPASS GRAFT  1998   x2; LIMA to LAD; SVG to diagonal off bypass   HEMORRHOID SURGERY  08/26/2012   Dr. Luisa Hart   LOOP RECORDER INSERTION N/A 05/13/2018   Procedure: LOOP RECORDER INSERTION;  Surgeon: Marinus Maw, MD;  Location[facility] INVASIVE CV LAB;  Service: Cardiovascular;  Laterality: N/A;   LOOP RECORDER REMOVAL N/A 05/26/2019   Procedure: LOOP RECORDER REMOVAL;  Surgeon: Marinus Maw, MD;  Location: [facility] INVASIVE CV LAB;  Service: Cardiovascular;  Laterality: N/A;   MASS EXCISION Left 11/23/2015   Procedure: LEFT WRIST EXCISION CYST;  Surgeon: Betha Loa, MD;  Location: [facility];  Service: Orthopedics;  Laterality: Left;   PACEMAKER  IMPLANT N/A 05/26/2019   Procedure: PACEMAKER IMPLANT;  Surgeon: Marinus Maw, MD;  Location: [facility] INVASIVE CV LAB;  Service: Cardiovascular;  Laterality: N/A;   SKIN BIOPSY  01/29/14   (R) neck; (R) scalp, 2 (L) neck; shave biopsy Superficial basal cell carcinoma Dr. Arminda Resides   TONSILLECTOMY  [phone]    Allergies  Allergen Reactions   Iodinated Contrast Media Nausea Only and Other (See Comments)    Severe nausea and also passed out   Iodine Nausea Only and Other (See Comments)    "Allergic," per [facility]- PASSED OUT   Lodine [Etodolac] Other (See Comments)    "Allergic," per [facility]   Other Other (See Comments)    Seasonal Allergies   Bactrim Rash   Relafen [Nabumetone] Rash   Sulfamethoxazole-Trimethoprim Rash    In matrix   Sulfanilamide Rash    Outpatient Encounter Medications as of 01/08/2023  Medication Sig   acetaminophen (TYLENOL) 325 MG tablet Take 650 mg by mouth 3 (three) times daily as needed (for pain).   amLODipine (NORVASC) 5 MG tablet Take 5 mg by mouth daily.  Artificial Saliva (BIOTENE DRY MOUTH MOISTURIZING) SOLN Use as directed 1-2 sprays in the mouth or throat 3 (three) times daily.   aspirin 81 MG chewable tablet Chew 1 tablet (81 mg total) by mouth daily.   atenolol (TENORMIN) 50 MG tablet Take 1 tablet (50 mg total) by mouth 2 (two) times daily.   Calcium Carb-Cholecalciferol (CALCIUM 500 + D3 PO) Take 1 capsule by mouth in the morning.   diclofenac Sodium (VOLTAREN) 1 % GEL Apply 2 g topically as needed (BID PRN).   digoxin (LANOXIN) 0.125 MG tablet Take 1 tablet (0.125 mg total) by mouth every other day.   docusate (COLACE) 50 MG/5ML liquid Take 100 mg by mouth daily.   hydrALAZINE (APRESOLINE) 10 MG tablet Take 10 mg by mouth 3 (three) times daily as needed (SBP>180).   levocetirizine (XYZAL) 5 MG tablet Take 5 mg by mouth at bedtime.   losartan (COZAAR) 25 MG tablet Take 25 mg by mouth daily.   Multiple Vitamins-Minerals (MULTIVITAMIN WITH IRON-MINERALS)  liquid Take 15 mLs by mouth daily.   polyethylene glycol (MIRALAX / GLYCOLAX) 17 g packet Take 17 g by mouth See admin instructions. Mix 17 grams of powder into 4-6 oz of beverage of choice and drink once a day   sodium chloride (OCEAN) 0.65 % nasal spray Place 1 spray into the nose 4 (four) times daily as needed for congestion.   No facility-administered encounter medications on file as of 01/08/2023.    Review of Systems  Constitutional:  Positive for activity change. Negative for appetite change.  HENT: Negative.    Respiratory:  Negative for cough and shortness of breath.   Cardiovascular:  Negative for leg swelling.  Gastrointestinal:  Negative for constipation.  Genitourinary: Negative.   Musculoskeletal:  Positive for gait problem and myalgias. Negative for arthralgias.  Skin: Negative.   Neurological:  Positive for weakness. Negative for dizziness.  Psychiatric/Behavioral:  Positive for confusion. Negative for dysphoric mood and sleep disturbance.     Immunization History  Administered Date(s) Administered   Influenza Whole 08/06/2012   Influenza, High Dose Seasonal PF 08/15/2019, 08/27/2020   Influenza,inj,Quad PF,6+ Mos 08/30/2018   Influenza-Unspecified 08/06/2013, 08/24/2014, 08/26/2015, 08/31/2016, 08/27/2017, 08/27/2020   Moderna Sars-Covid-2 Vaccination 11/16/2019, 12/16/2019, 09/16/2020   Pneumococcal Conjugate-13 09/22/2015   Pneumococcal Polysaccharide-23 08/29/2006   Td 10/07/2007   Zoster Recombinat (Shingrix) 02/12/2018, 05/13/2018   Zoster, Live 03/12/2008   Pertinent  Health Maintenance Due  Topic Date Due   INFLUENZA VACCINE  02/04/2023 (Originally 06/06/2022)   DEXA SCAN  Completed      10/02/2022    8:00 AM 10/03/2022   10:49 AM 10/04/2022    9:50 AM 10/06/2022   10:31 AM 12/01/2022    9:28 AM  Fall Risk  Falls in the past year?  1 1 1 1   Was there an injury with Fall?  1 1 1 1   Fall Risk Category Calculator  3 3 2 2   Fall Risk Category (Retired)   High High Moderate   (RETIRED) Patient Fall Risk Level High fall risk High fall risk High fall risk High fall risk   Patient at Risk for Falls Due to  History of fall(s) History of fall(s) History of fall(s) History of fall(s)  Fall risk Follow up  Falls evaluation completed Falls evaluation completed Falls evaluation completed Falls evaluation completed   Functional Status Survey:    Vitals:   01/08/23 1017  BP: 135/81  Pulse: 83  Resp: 17  Temp: (!) 97  F (36.1 C)  TempSrc: Temporal  SpO2: 98%  Weight: 121 lb 3.2 oz (55 kg)  Height: 5\' 6"  (1.676 m)   Body mass index is 19.56 kg/m. Physical Exam Vitals reviewed.  HENT:     Head: Normocephalic.     Nose: Nose normal.     Mouth/Throat:     Mouth: Mucous membranes are moist.     Pharynx: Oropharynx is clear.  Eyes:     Pupils: Pupils are equal, round, and reactive to light.  Cardiovascular:     Rate and Rhythm: Normal rate. Rhythm irregular.     Pulses: Normal pulses.     Heart sounds: Normal heart sounds. No murmur heard. Pulmonary:     Effort: Pulmonary effort is normal.     Breath sounds: Normal breath sounds.  Abdominal:     General: Abdomen is flat. Bowel sounds are normal.     Palpations: Abdomen is soft.  Musculoskeletal:        General: No swelling.     Cervical back: Neck supple.     Comments: Left Leg No Swelling Was able to move her Leg with no issues  Skin:    General: Skin is warm.  Neurological:     Mental Status: She is alert.     Comments: Left Hemiplegia and Neglect  Psychiatric:        Mood and Affect: Mood normal.        Thought Content: Thought content normal.     Labs reviewed: Recent Labs    09/30/22 0931 10/01/22 0806 10/02/22 0900 10/21/22 0000 12/07/22 0000 12/11/22 0000 01/02/23 0000  NA 141 142 142   < > 139 144 141  K 3.6 3.3* 3.9   < > 4.0 3.9 4.0  CL 108 111 116*   < > 104 108 108  CO2 22 20* 19*   < > 21 22 23*  GLUCOSE 107* 114* 108*  --   --   --   --   BUN 34*  36* 35*   < > 82* 59* 38*  CREATININE 1.25* 1.21* 1.09*   < > 1.3* 1.1 1.1  CALCIUM 8.5* 8.2* 8.2*   < > 8.9 8.8 8.7   < > = values in this interval not displayed.   Recent Labs    09/26/22 1539 10/21/22 0000 12/07/22 0000 12/11/22 0000 01/02/23 0000  AST 17   < > 58* 60* 21  ALT 12   < > 70* 73* 21  ALKPHOS 71   < > 99 94 80  BILITOT 0.3  --   --   --   --   PROT 7.1  --   --   --   --   ALBUMIN 3.1*   < > 3.1* 2.9* 3.1*   < > = values in this interval not displayed.   Recent Labs    09/30/22 0931 10/01/22 0806 10/02/22 0900 10/09/22 0000 10/12/22 0000 10/16/22 0000 10/20/22 0000 10/31/22 0000 12/08/22 0000 01/02/23 0000  WBC 10.7* 11.0* 9.9 18.8 17.7 15.1   < > 11.4 10.4 7.0  NEUTROABS  --   --   --  11.10 11.60 9.40  --   --   --   --   HGB 9.2* 8.5* 8.8* 9.3* 9.7* 8.6*   < > 8.2* 8.3* 8.8*  HCT 28.9* 25.7* 27.2* 29* 30* 27*   < > 25* 25* 27*  MCV 88.1 87.1 86.6  --   --   --   --   --   --   --  PLT 96* 87* 109* 179 210 217   < > 103* 187 162   < > = values in this interval not displayed.   Lab Results  Component Value Date   TSH 2.87 02/03/2022   Lab Results  Component Value Date   HGBA1C 5.7 (H) 09/26/2022   Lab Results  Component Value Date   CHOL 161 09/27/2022   HDL 39 (L) 09/27/2022   LDLCALC 114 (H) 09/27/2022   LDLDIRECT 81.3 08/19/2020   TRIG 39 09/27/2022   CHOLHDL 4.1 09/27/2022    Significant Diagnostic Results in last 30 days:  No results found.  Assessment/Plan 1. Unresponsive episode ? Seizure/ TIA Daughter does not want her to go to Neurology yet We discussed different options if it happens again Right now she wants to make that decision at that time  2. 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 Oropharyngeal  dysphagia On Regular D 1 Diet Honey thick So far she is able to maintain her nutritional status right now She had required IV fluids before  4. Permanent atrial fibrillation (HCC) On Aspirin and Toprol Has had bleeding issues with DOCA and Coumadin Daughter has decided for Aspirin only for now  5. Left leg pain Try Tylenol PRn 6 Anemia Not doing iron to avoid GI side effects 7 s/p Pacemaker  Family/ staff Communication:   Labs/tests ordered:

## 2023-01-09 ENCOUNTER — Other Ambulatory Visit: Payer: Self-pay

## 2023-01-09 ENCOUNTER — Encounter: Payer: Self-pay | Admitting: Internal Medicine

## 2023-01-09 DIAGNOSIS — I69354 Hemiplegia and hemiparesis following cerebral infarction affecting left non-dominant side: Secondary | ICD-10-CM | POA: Diagnosis not present

## 2023-01-09 DIAGNOSIS — M6389 Disorders of muscle in diseases classified elsewhere, multiple sites: Secondary | ICD-10-CM | POA: Diagnosis not present

## 2023-01-09 DIAGNOSIS — I69322 Dysarthria following cerebral infarction: Secondary | ICD-10-CM | POA: Diagnosis not present

## 2023-01-09 DIAGNOSIS — R278 Other lack of coordination: Secondary | ICD-10-CM | POA: Diagnosis not present

## 2023-01-09 DIAGNOSIS — I6521 Occlusion and stenosis of right carotid artery: Secondary | ICD-10-CM | POA: Diagnosis not present

## 2023-01-09 DIAGNOSIS — I6621 Occlusion and stenosis of right posterior cerebral artery: Secondary | ICD-10-CM | POA: Diagnosis not present

## 2023-01-09 NOTE — Patient Outreach (Signed)
Telephone outreach to patient to obtain mRS was successfully completed. MRS= [place] Management Assistant [phone]

## 2023-01-10 DIAGNOSIS — I6621 Occlusion and stenosis of right posterior cerebral artery: Secondary | ICD-10-CM | POA: Diagnosis not present

## 2023-01-10 DIAGNOSIS — I6521 Occlusion and stenosis of right carotid artery: Secondary | ICD-10-CM | POA: Diagnosis not present

## 2023-01-10 DIAGNOSIS — I69354 Hemiplegia and hemiparesis following cerebral infarction affecting left non-dominant side: Secondary | ICD-10-CM | POA: Diagnosis not present

## 2023-01-10 DIAGNOSIS — I69322 Dysarthria following cerebral infarction: Secondary | ICD-10-CM | POA: Diagnosis not present

## 2023-01-10 DIAGNOSIS — R278 Other lack of coordination: Secondary | ICD-10-CM | POA: Diagnosis not present

## 2023-01-10 DIAGNOSIS — M6389 Disorders of muscle in diseases classified elsewhere, multiple sites: Secondary | ICD-10-CM | POA: Diagnosis not present

## 2023-01-11 DIAGNOSIS — I6521 Occlusion and stenosis of right carotid artery: Secondary | ICD-10-CM | POA: Diagnosis not present

## 2023-01-11 DIAGNOSIS — I6621 Occlusion and stenosis of right posterior cerebral artery: Secondary | ICD-10-CM | POA: Diagnosis not present

## 2023-01-11 DIAGNOSIS — R278 Other lack of coordination: Secondary | ICD-10-CM | POA: Diagnosis not present

## 2023-01-11 DIAGNOSIS — I69322 Dysarthria following cerebral infarction: Secondary | ICD-10-CM | POA: Diagnosis not present

## 2023-01-11 DIAGNOSIS — M6389 Disorders of muscle in diseases classified elsewhere, multiple sites: Secondary | ICD-10-CM | POA: Diagnosis not present

## 2023-01-11 DIAGNOSIS — I69354 Hemiplegia and hemiparesis following cerebral infarction affecting left non-dominant side: Secondary | ICD-10-CM | POA: Diagnosis not present

## 2023-01-12 DIAGNOSIS — I69354 Hemiplegia and hemiparesis following cerebral infarction affecting left non-dominant side: Secondary | ICD-10-CM | POA: Diagnosis not present

## 2023-01-12 DIAGNOSIS — I6621 Occlusion and stenosis of right posterior cerebral artery: Secondary | ICD-10-CM | POA: Diagnosis not present

## 2023-01-12 DIAGNOSIS — M6389 Disorders of muscle in diseases classified elsewhere, multiple sites: Secondary | ICD-10-CM | POA: Diagnosis not present

## 2023-01-12 DIAGNOSIS — I6521 Occlusion and stenosis of right carotid artery: Secondary | ICD-10-CM | POA: Diagnosis not present

## 2023-01-12 DIAGNOSIS — R278 Other lack of coordination: Secondary | ICD-10-CM | POA: Diagnosis not present

## 2023-01-12 DIAGNOSIS — I69322 Dysarthria following cerebral infarction: Secondary | ICD-10-CM | POA: Diagnosis not present

## 2023-01-13 DIAGNOSIS — I69322 Dysarthria following cerebral infarction: Secondary | ICD-10-CM | POA: Diagnosis not present

## 2023-01-13 DIAGNOSIS — R278 Other lack of coordination: Secondary | ICD-10-CM | POA: Diagnosis not present

## 2023-01-13 DIAGNOSIS — I69354 Hemiplegia and hemiparesis following cerebral infarction affecting left non-dominant side: Secondary | ICD-10-CM | POA: Diagnosis not present

## 2023-01-13 DIAGNOSIS — M6389 Disorders of muscle in diseases classified elsewhere, multiple sites: Secondary | ICD-10-CM | POA: Diagnosis not present

## 2023-01-13 DIAGNOSIS — I6521 Occlusion and stenosis of right carotid artery: Secondary | ICD-10-CM | POA: Diagnosis not present

## 2023-01-13 DIAGNOSIS — I6621 Occlusion and stenosis of right posterior cerebral artery: Secondary | ICD-10-CM | POA: Diagnosis not present

## 2023-01-15 DIAGNOSIS — I6521 Occlusion and stenosis of right carotid artery: Secondary | ICD-10-CM | POA: Diagnosis not present

## 2023-01-15 DIAGNOSIS — I69354 Hemiplegia and hemiparesis following cerebral infarction affecting left non-dominant side: Secondary | ICD-10-CM | POA: Diagnosis not present

## 2023-01-15 DIAGNOSIS — I69322 Dysarthria following cerebral infarction: Secondary | ICD-10-CM | POA: Diagnosis not present

## 2023-01-15 DIAGNOSIS — R278 Other lack of coordination: Secondary | ICD-10-CM | POA: Diagnosis not present

## 2023-01-15 DIAGNOSIS — I6621 Occlusion and stenosis of right posterior cerebral artery: Secondary | ICD-10-CM | POA: Diagnosis not present

## 2023-01-15 DIAGNOSIS — M6389 Disorders of muscle in diseases classified elsewhere, multiple sites: Secondary | ICD-10-CM | POA: Diagnosis not present

## 2023-01-16 DIAGNOSIS — M6389 Disorders of muscle in diseases classified elsewhere, multiple sites: Secondary | ICD-10-CM | POA: Diagnosis not present

## 2023-01-16 DIAGNOSIS — I6521 Occlusion and stenosis of right carotid artery: Secondary | ICD-10-CM | POA: Diagnosis not present

## 2023-01-16 DIAGNOSIS — I69354 Hemiplegia and hemiparesis following cerebral infarction affecting left non-dominant side: Secondary | ICD-10-CM | POA: Diagnosis not present

## 2023-01-16 DIAGNOSIS — I69322 Dysarthria following cerebral infarction: Secondary | ICD-10-CM | POA: Diagnosis not present

## 2023-01-16 DIAGNOSIS — R278 Other lack of coordination: Secondary | ICD-10-CM | POA: Diagnosis not present

## 2023-01-16 DIAGNOSIS — I6621 Occlusion and stenosis of right posterior cerebral artery: Secondary | ICD-10-CM | POA: Diagnosis not present

## 2023-01-17 DIAGNOSIS — I6621 Occlusion and stenosis of right posterior cerebral artery: Secondary | ICD-10-CM | POA: Diagnosis not present

## 2023-01-17 DIAGNOSIS — R278 Other lack of coordination: Secondary | ICD-10-CM | POA: Diagnosis not present

## 2023-01-17 DIAGNOSIS — M6389 Disorders of muscle in diseases classified elsewhere, multiple sites: Secondary | ICD-10-CM | POA: Diagnosis not present

## 2023-01-17 DIAGNOSIS — I6521 Occlusion and stenosis of right carotid artery: Secondary | ICD-10-CM | POA: Diagnosis not present

## 2023-01-17 DIAGNOSIS — I69354 Hemiplegia and hemiparesis following cerebral infarction affecting left non-dominant side: Secondary | ICD-10-CM | POA: Diagnosis not present

## 2023-01-17 DIAGNOSIS — I69322 Dysarthria following cerebral infarction: Secondary | ICD-10-CM | POA: Diagnosis not present

## 2023-01-18 DIAGNOSIS — I69354 Hemiplegia and hemiparesis following cerebral infarction affecting left non-dominant side: Secondary | ICD-10-CM | POA: Diagnosis not present

## 2023-01-18 DIAGNOSIS — R278 Other lack of coordination: Secondary | ICD-10-CM | POA: Diagnosis not present

## 2023-01-18 DIAGNOSIS — M6389 Disorders of muscle in diseases classified elsewhere, multiple sites: Secondary | ICD-10-CM | POA: Diagnosis not present

## 2023-01-18 DIAGNOSIS — I69322 Dysarthria following cerebral infarction: Secondary | ICD-10-CM | POA: Diagnosis not present

## 2023-01-18 DIAGNOSIS — I6621 Occlusion and stenosis of right posterior cerebral artery: Secondary | ICD-10-CM | POA: Diagnosis not present

## 2023-01-18 DIAGNOSIS — I6521 Occlusion and stenosis of right carotid artery: Secondary | ICD-10-CM | POA: Diagnosis not present

## 2023-01-19 DIAGNOSIS — I69354 Hemiplegia and hemiparesis following cerebral infarction affecting left non-dominant side: Secondary | ICD-10-CM | POA: Diagnosis not present

## 2023-01-19 DIAGNOSIS — I69322 Dysarthria following cerebral infarction: Secondary | ICD-10-CM | POA: Diagnosis not present

## 2023-01-19 DIAGNOSIS — R278 Other lack of coordination: Secondary | ICD-10-CM | POA: Diagnosis not present

## 2023-01-19 DIAGNOSIS — I6621 Occlusion and stenosis of right posterior cerebral artery: Secondary | ICD-10-CM | POA: Diagnosis not present

## 2023-01-19 DIAGNOSIS — I6521 Occlusion and stenosis of right carotid artery: Secondary | ICD-10-CM | POA: Diagnosis not present

## 2023-01-19 DIAGNOSIS — M6389 Disorders of muscle in diseases classified elsewhere, multiple sites: Secondary | ICD-10-CM | POA: Diagnosis not present

## 2023-01-22 ENCOUNTER — Non-Acute Institutional Stay (SKILLED_NURSING_FACILITY): Payer: Medicare Other | Admitting: Internal Medicine

## 2023-01-22 DIAGNOSIS — M6389 Disorders of muscle in diseases classified elsewhere, multiple sites: Secondary | ICD-10-CM | POA: Diagnosis not present

## 2023-01-22 DIAGNOSIS — K1121 Acute sialoadenitis: Secondary | ICD-10-CM | POA: Diagnosis not present

## 2023-01-22 DIAGNOSIS — I69354 Hemiplegia and hemiparesis following cerebral infarction affecting left non-dominant side: Secondary | ICD-10-CM | POA: Diagnosis not present

## 2023-01-22 DIAGNOSIS — I69322 Dysarthria following cerebral infarction: Secondary | ICD-10-CM | POA: Diagnosis not present

## 2023-01-22 DIAGNOSIS — I6521 Occlusion and stenosis of right carotid artery: Secondary | ICD-10-CM | POA: Diagnosis not present

## 2023-01-22 DIAGNOSIS — R278 Other lack of coordination: Secondary | ICD-10-CM | POA: Diagnosis not present

## 2023-01-22 DIAGNOSIS — R1312 Dysphagia, oropharyngeal phase: Secondary | ICD-10-CM | POA: Diagnosis not present

## 2023-01-22 DIAGNOSIS — I6621 Occlusion and stenosis of right posterior cerebral artery: Secondary | ICD-10-CM | POA: Diagnosis not present

## 2023-01-22 NOTE — Progress Notes (Signed)
Location: [facility] psychologist of Service:  SNF (31)  Provider:   Code Status: DNR Goals of Care:     01/08/2023   10:23 AM  Advanced Directives  Does Patient Have a Medical Advance Directive? Yes  Type of Paramedic of [place];Living will;Out of facility DNR (pink MOST or yellow form)  Copy of [place] in Chart? Yes - validated most recent copy scanned in chart (See row information)  Pre-existing out of facility DNR order (yellow form or pink MOST form) Pink MOST/Yellow Form most recent copy in chart - Physician notified to receive inpatient order     Chief Complaint  Patient presents with   Acute Visit    HPI: Patient is a 89 y.o. 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  Patient does not have any fever unable to give much history She has Dysphagia diet with Honey thick liquids  Later she did c/o Some pain in that area  Past Medical History:  Diagnosis Date   Actinic keratosis 05/25/2014   Anxiety    Atrial fibrillation ([place]) 09/21/2010   Basal cell carcinoma 05/25/2014   Multiple removed by Dr. Danella Sensing in March 2015: right neck, left neck, scalp    Bell's palsy 07/18/1979   Cervicalgia 01/31/2012   Closed fracture of lumbar vertebra without mention of spinal cord injury 07/17/2005   Conjunctiva disorder 12/26/2010   Coronary atherosclerosis of native coronary artery 07/17/1997   Cramp of limb 08/16/2009   Degeneration of lumbar or lumbosacral intervertebral disc 01/31/2012   Disturbance of skin sensation  08/2009   Dizziness and giddiness 11/08/2011   vertigo   External hemorrhoids without mention of complication 0000000   External hemorrhoids without mention of complication [id number]   Ganglion of tendon sheath 08/16/2009   Leg cramp 10/27/2013   Most frequently the right leg.    Long term (current) use of anticoagulants 09/2010   Lumbago 07/2009   Meralgia paresthetica 07/2007   MI, old    Myalgia and myositis, unspecified 01/17/2012   Osteoporosis    Other abnormal blood chemistry 04/08/2012   Other abnormal blood chemistry 2013   hyperglycemia   Other disorder of muscle, ligament, and fascia 04/08/2012   Other specified cardiac dysrhythmias(427.89) 06/27/2010   Pacemaker 05/06/2020   Pain in joint, ankle and foot 10/27/2013   Bilateral since 1998    Pain in joint, shoulder region 08/12/2012   Pain in joint, upper arm 12/26/2010   Pain in limb 01/11/2011   Pain in thoracic spine 01/31/2012   Pathologic fracture of vertebrae 01/22/2012   PVC's (premature ventricular contractions)    Rash and other nonspecific skin eruption 08/02/2011   Senile osteoporosis 07/17/1993   Stroke ([place])    Unspecified essential hypertension 07/17/1997   Varicose veins of lower extremities 08/16/2009   Varicose veins of lower extremities 08/16/2009    Past Surgical History:  Procedure Laterality Date   COLONOSCOPY WITH PROPOFOL N/A 10/19/2015   Procedure: COLONOSCOPY WITH PROPOFOL;  Surgeon: Gatha Mayer,  MD;  Location: [facility];  Service: Endoscopy;  Laterality: N/A;   CORONARY ARTERY BYPASS GRAFT  1998   x2; LIMA to LAD; SVG to diagonal off bypass   HEMORRHOID SURGERY  08/26/2012   Dr. Brantley Stage   LOOP RECORDER INSERTION N/A 05/13/2018   Procedure: LOOP RECORDER INSERTION;  Surgeon: [name], MD;  Location: [place] [facility];  Service: Cardiovascular;  Laterality: N/A;   LOOP RECORDER REMOVAL N/A 05/26/2019   Procedure: LOOP RECORDER REMOVAL;  Surgeon: [name], MD;   Location: [place] [facility];  Service: Cardiovascular;  Laterality: N/A;   MASS EXCISION Left 11/23/2015   Procedure: LEFT WRIST EXCISION CYST;  Surgeon: Leanora Cover, MD;  Location: [place];  Service: Orthopedics;  Laterality: Left;   PACEMAKER IMPLANT N/A 05/26/2019   Procedure: PACEMAKER IMPLANT;  Surgeon: [name], MD;  Location: [place] [facility];  Service: Cardiovascular;  Laterality: N/A;   SKIN BIOPSY  01/29/14   (R) neck; (R) scalp, 2 (L) neck; shave biopsy Superficial basal cell carcinoma Dr. Danella Sensing   TONSILLECTOMY  [phone]    Allergies  Allergen Reactions   Iodinated Contrast Media Nausea Only and Other (See Comments)    Severe nausea and also passed out   Iodine Nausea Only and Other (See Comments)    "Allergic," per [facility]- PASSED OUT   Lodine [Etodolac] Other (See Comments)    "Allergic," per [facility]   Other Other (See Comments)    Seasonal Allergies   Bactrim Rash   Relafen [Nabumetone] Rash   Sulfamethoxazole-Trimethoprim Rash    In matrix   Sulfanilamide Rash    Outpatient Encounter Medications as of 01/22/2023  Medication Sig   acetaminophen (TYLENOL) 325 MG tablet Take 650 mg by mouth 3 (three) times daily as needed (for pain).   amLODipine (NORVASC) 5 MG tablet Take 5 mg by mouth daily.   Artificial Saliva (BIOTENE DRY MOUTH MOISTURIZING) SOLN Use as directed 1-2 sprays in the mouth or throat 3 (three) times daily.   aspirin 81 MG chewable tablet Chew 1 tablet (81 mg total) by mouth daily.   atenolol (TENORMIN) 50 MG tablet Take 1 tablet (50 mg total) by mouth 2 (two) times daily.   Calcium Carb-Cholecalciferol (CALCIUM 500 + D3 PO) Take 1 capsule by mouth in the morning.   diclofenac Sodium (VOLTAREN) 1 % GEL Apply 2 g topically as needed (BID PRN).   digoxin (LANOXIN) 0.125 MG tablet Take 1 tablet (0.125 mg total) by mouth every other day.   docusate (COLACE) 50 MG/5ML liquid Take 100 mg by mouth daily.   hydrALAZINE (APRESOLINE) 10 MG  tablet Take 10 mg by mouth 3 (three) times daily as needed (SBP>180).   levocetirizine (XYZAL) 5 MG tablet Take 5 mg by mouth at bedtime.   losartan (COZAAR) 25 MG tablet Take 25 mg by mouth daily.   Multiple Vitamins-Minerals (MULTIVITAMIN WITH IRON-MINERALS) liquid Take 15 mLs by mouth daily.   polyethylene glycol (MIRALAX / GLYCOLAX) 17 g packet Take 17 g by mouth See admin instructions. Mix 17 grams of powder into 4-6 oz of beverage of choice and drink once a day   sodium chloride (OCEAN) 0.65 % nasal spray Place 1 spray into the nose 4 (four) times daily as needed for congestion.   No facility-administered encounter medications on file as of 01/22/2023.    Review of Systems:  Review of Systems  Unable to perform ROS: Other    Health Maintenance  Topic Date Due   DTaP/Tdap/Td (2 - Tdap) 10/06/2017   COVID-19 Vaccine (4 - 2023-24 season) 07/07/2022   Medicare Annual Wellness (AWV)  09/27/2022   INFLUENZA VACCINE  02/04/2023 (Originally 06/06/2022)   Pneumonia Vaccine 71+ Years old  Completed   DEXA SCAN  Completed   Zoster Vaccines- Shingrix  Completed   HPV VACCINES  Aged Out    Physical Exam: Vitals:   01/22/23 1013  BP: 118/70  Pulse: 65  Resp: 17  Temp: (!) 97 F (36.1 C)  Weight: 124 lb 9.6 oz (56.5 kg)   Body mass index is 20.11 kg/m. Physical Exam Vitals reviewed.  Constitutional:      Appearance: Normal appearance.  HENT:     Head: Normocephalic.     Nose: Nose normal.     Mouth/Throat:     Mouth: Mucous membranes are moist.     Pharynx: Oropharynx is clear.     Comments: Last tooth on left side  had decay with some redness around it Patient has significant amount of swelling in her Left side which is tender and hard Eyes:     Pupils: Pupils are equal, round, and reactive to light.  Cardiovascular:     Rate and Rhythm: Normal rate. Rhythm irregular.     Pulses: Normal pulses.     Heart sounds: Normal heart sounds. No murmur heard. Pulmonary:      Effort: Pulmonary effort is normal.     Breath sounds: Normal breath sounds.  Abdominal:     General: Abdomen is flat. Bowel sounds are normal.     Palpations: Abdomen is soft.  Musculoskeletal:        General: No swelling.     Cervical back: Neck supple.  Skin:    General: Skin is warm.  Neurological:     Mental Status: She is alert.  Psychiatric:        Mood and Affect: Mood normal.        Thought Content: Thought content normal.     Labs reviewed: Basic Metabolic Panel: Recent Labs    02/03/22 0000 04/19/22 0000 09/30/22 0931 10/01/22 0806 10/02/22 0900 10/21/22 0000 12/07/22 0000 12/11/22 0000 01/02/23 0000  NA  --    < > 141 142 142   < > 139 144 141  K  --    < > 3.6 3.3* 3.9   < > 4.0 3.9 4.0  CL  --    < > 108 111 116*   < > 104 108 108  CO2  --    < > 22 20* 19*   < > 21 22 23*  GLUCOSE  --    < > 107* 114* 108*  --   --   --   --   BUN  --    < > 34* 36* 35*   < > 82* 59* 38*  CREATININE  --    < > 1.25* 1.21* 1.09*   < > 1.3* 1.1 1.1  CALCIUM  --    < > 8.5* 8.2* 8.2*   < > 8.9 8.8 8.7  TSH 2.87  --   --   --   --   --   --   --   --    < > = values in this interval not displayed.   Liver Function Tests: Recent Labs    09/26/22 1539 10/21/22 0000 12/07/22 0000 12/11/22 0000 01/02/23 0000  AST 17   < > 58* 60*  21  ALT 12   < > 70* 73* 21  ALKPHOS 71   < > 99 94 80  BILITOT 0.3  --   --   --   --   PROT 7.1  --   --   --   --   ALBUMIN 3.1*   < > 3.1* 2.9* 3.1*   < > = values in this interval not displayed.   No results for input(s): "LIPASE", "AMYLASE" in the last 8760 hours. No results for input(s): "AMMONIA" in the last 8760 hours. CBC: Recent Labs    09/30/22 0931 10/01/22 0806 10/02/22 0900 10/09/22 0000 10/12/22 0000 10/16/22 0000 10/20/22 0000 10/31/22 0000 12/08/22 0000 01/02/23 0000  WBC 10.7* 11.0* 9.9 18.8 17.7 15.1   < > 11.4 10.4 7.0  NEUTROABS  --   --   --  11.10 11.60 9.40  --   --   --   --   HGB 9.2* 8.5* 8.8* 9.3*  9.7* 8.6*   < > 8.2* 8.3* 8.8*  HCT 28.9* 25.7* 27.2* 29* 30* 27*   < > 25* 25* 27*  MCV 88.1 87.1 86.6  --   --   --   --   --   --   --   PLT 96* 87* 109* 179 210 217   < > 103* 187 162   < > = values in this interval not displayed.   Lipid Panel: Recent Labs    08/01/22 0000 09/27/22 0546  CHOL 174 161  HDL 43 39*  LDLCALC 115 114*  TRIG 81 39  CHOLHDL  --  4.1   Lab Results  Component Value Date   HGBA1C 5.7 (H) 09/26/2022    Procedures since last visit: No results found.  Assessment/Plan 1. 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 ([place]) Only on aspirin for now due to her risk of bleeding Total Dependent now for her ADLS and Transfers Was taken of Statin as LFTS were elevated Will consider to restart it   Oropharyngeal dysphagia On Regular D 1 Diet Honey thick So far she is able to maintain her nutritional status right now She had required IV fluids before   Permanent atrial fibrillation (HCC) On Aspirin and Toprol Has had bleeding issues with DOCA and Coumadin Daughter has decided for Aspirin only for now    Left leg pain Try Tylenol PRn Anemia Not doing iron to avoid GI side effects s/p Pacemaker  Labs/tests ordered:  * No order type specified * Next appt:  Visit date not found

## 2023-01-23 DIAGNOSIS — I69354 Hemiplegia and hemiparesis following cerebral infarction affecting left non-dominant side: Secondary | ICD-10-CM | POA: Diagnosis not present

## 2023-01-23 DIAGNOSIS — M6389 Disorders of muscle in diseases classified elsewhere, multiple sites: Secondary | ICD-10-CM | POA: Diagnosis not present

## 2023-01-23 DIAGNOSIS — I6621 Occlusion and stenosis of right posterior cerebral artery: Secondary | ICD-10-CM | POA: Diagnosis not present

## 2023-01-23 DIAGNOSIS — I6521 Occlusion and stenosis of right carotid artery: Secondary | ICD-10-CM | POA: Diagnosis not present

## 2023-01-23 DIAGNOSIS — R278 Other lack of coordination: Secondary | ICD-10-CM | POA: Diagnosis not present

## 2023-01-23 DIAGNOSIS — I69322 Dysarthria following cerebral infarction: Secondary | ICD-10-CM | POA: Diagnosis not present

## 2023-01-24 DIAGNOSIS — I6521 Occlusion and stenosis of right carotid artery: Secondary | ICD-10-CM | POA: Diagnosis not present

## 2023-01-24 DIAGNOSIS — K122 Cellulitis and abscess of mouth: Secondary | ICD-10-CM | POA: Diagnosis not present

## 2023-01-24 DIAGNOSIS — R278 Other lack of coordination: Secondary | ICD-10-CM | POA: Diagnosis not present

## 2023-01-24 DIAGNOSIS — I69354 Hemiplegia and hemiparesis following cerebral infarction affecting left non-dominant side: Secondary | ICD-10-CM | POA: Diagnosis not present

## 2023-01-24 DIAGNOSIS — M6389 Disorders of muscle in diseases classified elsewhere, multiple sites: Secondary | ICD-10-CM | POA: Diagnosis not present

## 2023-01-24 DIAGNOSIS — I6621 Occlusion and stenosis of right posterior cerebral artery: Secondary | ICD-10-CM | POA: Diagnosis not present

## 2023-01-24 DIAGNOSIS — I69322 Dysarthria following cerebral infarction: Secondary | ICD-10-CM | POA: Diagnosis not present

## 2023-01-26 ENCOUNTER — Encounter: Payer: Self-pay | Admitting: Internal Medicine

## 2023-01-26 DIAGNOSIS — I6621 Occlusion and stenosis of right posterior cerebral artery: Secondary | ICD-10-CM | POA: Diagnosis not present

## 2023-01-26 DIAGNOSIS — I6521 Occlusion and stenosis of right carotid artery: Secondary | ICD-10-CM | POA: Diagnosis not present

## 2023-01-26 DIAGNOSIS — I69322 Dysarthria following cerebral infarction: Secondary | ICD-10-CM | POA: Diagnosis not present

## 2023-01-26 DIAGNOSIS — M6389 Disorders of muscle in diseases classified elsewhere, multiple sites: Secondary | ICD-10-CM | POA: Diagnosis not present

## 2023-01-26 DIAGNOSIS — R278 Other lack of coordination: Secondary | ICD-10-CM | POA: Diagnosis not present

## 2023-01-26 DIAGNOSIS — I69354 Hemiplegia and hemiparesis following cerebral infarction affecting left non-dominant side: Secondary | ICD-10-CM | POA: Diagnosis not present

## 2023-01-30 DIAGNOSIS — I69322 Dysarthria following cerebral infarction: Secondary | ICD-10-CM | POA: Diagnosis not present

## 2023-01-30 DIAGNOSIS — I69354 Hemiplegia and hemiparesis following cerebral infarction affecting left non-dominant side: Secondary | ICD-10-CM | POA: Diagnosis not present

## 2023-01-30 DIAGNOSIS — M6389 Disorders of muscle in diseases classified elsewhere, multiple sites: Secondary | ICD-10-CM | POA: Diagnosis not present

## 2023-01-30 DIAGNOSIS — I6521 Occlusion and stenosis of right carotid artery: Secondary | ICD-10-CM | POA: Diagnosis not present

## 2023-01-30 DIAGNOSIS — R278 Other lack of coordination: Secondary | ICD-10-CM | POA: Diagnosis not present

## 2023-01-30 DIAGNOSIS — I6621 Occlusion and stenosis of right posterior cerebral artery: Secondary | ICD-10-CM | POA: Diagnosis not present

## 2023-01-31 DIAGNOSIS — I6521 Occlusion and stenosis of right carotid artery: Secondary | ICD-10-CM | POA: Diagnosis not present

## 2023-01-31 DIAGNOSIS — I6621 Occlusion and stenosis of right posterior cerebral artery: Secondary | ICD-10-CM | POA: Diagnosis not present

## 2023-01-31 DIAGNOSIS — R278 Other lack of coordination: Secondary | ICD-10-CM | POA: Diagnosis not present

## 2023-01-31 DIAGNOSIS — M6389 Disorders of muscle in diseases classified elsewhere, multiple sites: Secondary | ICD-10-CM | POA: Diagnosis not present

## 2023-01-31 DIAGNOSIS — I69322 Dysarthria following cerebral infarction: Secondary | ICD-10-CM | POA: Diagnosis not present

## 2023-01-31 DIAGNOSIS — I69354 Hemiplegia and hemiparesis following cerebral infarction affecting left non-dominant side: Secondary | ICD-10-CM | POA: Diagnosis not present

## 2023-02-02 DIAGNOSIS — I69322 Dysarthria following cerebral infarction: Secondary | ICD-10-CM | POA: Diagnosis not present

## 2023-02-02 DIAGNOSIS — K122 Cellulitis and abscess of mouth: Secondary | ICD-10-CM | POA: Diagnosis not present

## 2023-02-02 DIAGNOSIS — M6389 Disorders of muscle in diseases classified elsewhere, multiple sites: Secondary | ICD-10-CM | POA: Diagnosis not present

## 2023-02-02 DIAGNOSIS — I69354 Hemiplegia and hemiparesis following cerebral infarction affecting left non-dominant side: Secondary | ICD-10-CM | POA: Diagnosis not present

## 2023-02-02 DIAGNOSIS — Z79899 Other long term (current) drug therapy: Secondary | ICD-10-CM | POA: Diagnosis not present

## 2023-02-02 DIAGNOSIS — R278 Other lack of coordination: Secondary | ICD-10-CM | POA: Diagnosis not present

## 2023-02-02 DIAGNOSIS — I6621 Occlusion and stenosis of right posterior cerebral artery: Secondary | ICD-10-CM | POA: Diagnosis not present

## 2023-02-02 DIAGNOSIS — I6521 Occlusion and stenosis of right carotid artery: Secondary | ICD-10-CM | POA: Diagnosis not present

## 2023-02-02 LAB — BASIC METABOLIC PANEL
BUN: 37 — AB (ref 4–21)
CO2: 22 (ref 13–22)
Chloride: 104 (ref 99–108)
Creatinine: 1.1 (ref 0.5–1.1)
Glucose: 96
Potassium: 3.9 mEq/L (ref 3.5–5.1)
Sodium: 142 (ref 137–147)

## 2023-02-02 LAB — CBC AND DIFFERENTIAL
HCT: 30 — AB (ref 36–46)
Hemoglobin: 9.2 — AB (ref 12.0–16.0)
Neutrophils Absolute: 5.7
Platelets: 182 10*3/uL (ref 150–400)
WBC: 9.1

## 2023-02-02 LAB — CBC: RBC: 3.39 — AB (ref 3.87–5.11)

## 2023-02-02 LAB — HEPATIC FUNCTION PANEL
ALT: 17 U/L (ref 7–35)
AST: 21 (ref 13–35)
Alkaline Phosphatase: 83 (ref 25–125)
Bilirubin, Total: 0.2

## 2023-02-02 LAB — COMPREHENSIVE METABOLIC PANEL
Albumin: 3.4 — AB (ref 3.5–5.0)
Calcium: 9.2 (ref 8.7–10.7)
Globulin: 3.7
eGFR: 46

## 2023-02-06 DIAGNOSIS — I6521 Occlusion and stenosis of right carotid artery: Secondary | ICD-10-CM | POA: Diagnosis not present

## 2023-02-06 DIAGNOSIS — I69354 Hemiplegia and hemiparesis following cerebral infarction affecting left non-dominant side: Secondary | ICD-10-CM | POA: Diagnosis not present

## 2023-02-06 DIAGNOSIS — I6621 Occlusion and stenosis of right posterior cerebral artery: Secondary | ICD-10-CM | POA: Diagnosis not present

## 2023-02-06 DIAGNOSIS — R278 Other lack of coordination: Secondary | ICD-10-CM | POA: Diagnosis not present

## 2023-02-06 DIAGNOSIS — M6389 Disorders of muscle in diseases classified elsewhere, multiple sites: Secondary | ICD-10-CM | POA: Diagnosis not present

## 2023-02-07 DIAGNOSIS — I69354 Hemiplegia and hemiparesis following cerebral infarction affecting left non-dominant side: Secondary | ICD-10-CM | POA: Diagnosis not present

## 2023-02-07 DIAGNOSIS — I6621 Occlusion and stenosis of right posterior cerebral artery: Secondary | ICD-10-CM | POA: Diagnosis not present

## 2023-02-07 DIAGNOSIS — R278 Other lack of coordination: Secondary | ICD-10-CM | POA: Diagnosis not present

## 2023-02-07 DIAGNOSIS — I6521 Occlusion and stenosis of right carotid artery: Secondary | ICD-10-CM | POA: Diagnosis not present

## 2023-02-07 DIAGNOSIS — M6389 Disorders of muscle in diseases classified elsewhere, multiple sites: Secondary | ICD-10-CM | POA: Diagnosis not present

## 2023-02-09 DIAGNOSIS — I6521 Occlusion and stenosis of right carotid artery: Secondary | ICD-10-CM | POA: Diagnosis not present

## 2023-02-09 DIAGNOSIS — I69354 Hemiplegia and hemiparesis following cerebral infarction affecting left non-dominant side: Secondary | ICD-10-CM | POA: Diagnosis not present

## 2023-02-09 DIAGNOSIS — M6389 Disorders of muscle in diseases classified elsewhere, multiple sites: Secondary | ICD-10-CM | POA: Diagnosis not present

## 2023-02-09 DIAGNOSIS — R278 Other lack of coordination: Secondary | ICD-10-CM | POA: Diagnosis not present

## 2023-02-09 DIAGNOSIS — I6621 Occlusion and stenosis of right posterior cerebral artery: Secondary | ICD-10-CM | POA: Diagnosis not present

## 2023-02-15 DIAGNOSIS — I6621 Occlusion and stenosis of right posterior cerebral artery: Secondary | ICD-10-CM | POA: Diagnosis not present

## 2023-02-15 DIAGNOSIS — I69354 Hemiplegia and hemiparesis following cerebral infarction affecting left non-dominant side: Secondary | ICD-10-CM | POA: Diagnosis not present

## 2023-02-15 DIAGNOSIS — I6521 Occlusion and stenosis of right carotid artery: Secondary | ICD-10-CM | POA: Diagnosis not present

## 2023-02-15 DIAGNOSIS — R278 Other lack of coordination: Secondary | ICD-10-CM | POA: Diagnosis not present

## 2023-02-15 DIAGNOSIS — M6389 Disorders of muscle in diseases classified elsewhere, multiple sites: Secondary | ICD-10-CM | POA: Diagnosis not present

## 2023-02-16 DIAGNOSIS — I69354 Hemiplegia and hemiparesis following cerebral infarction affecting left non-dominant side: Secondary | ICD-10-CM | POA: Diagnosis not present

## 2023-02-16 DIAGNOSIS — M6389 Disorders of muscle in diseases classified elsewhere, multiple sites: Secondary | ICD-10-CM | POA: Diagnosis not present

## 2023-02-16 DIAGNOSIS — R278 Other lack of coordination: Secondary | ICD-10-CM | POA: Diagnosis not present

## 2023-02-16 DIAGNOSIS — I6621 Occlusion and stenosis of right posterior cerebral artery: Secondary | ICD-10-CM | POA: Diagnosis not present

## 2023-02-16 DIAGNOSIS — I6521 Occlusion and stenosis of right carotid artery: Secondary | ICD-10-CM | POA: Diagnosis not present

## 2023-02-20 DIAGNOSIS — I6521 Occlusion and stenosis of right carotid artery: Secondary | ICD-10-CM | POA: Diagnosis not present

## 2023-02-20 DIAGNOSIS — R278 Other lack of coordination: Secondary | ICD-10-CM | POA: Diagnosis not present

## 2023-02-20 DIAGNOSIS — M6389 Disorders of muscle in diseases classified elsewhere, multiple sites: Secondary | ICD-10-CM | POA: Diagnosis not present

## 2023-02-20 DIAGNOSIS — I6621 Occlusion and stenosis of right posterior cerebral artery: Secondary | ICD-10-CM | POA: Diagnosis not present

## 2023-02-20 DIAGNOSIS — I69354 Hemiplegia and hemiparesis following cerebral infarction affecting left non-dominant side: Secondary | ICD-10-CM | POA: Diagnosis not present

## 2023-02-21 DIAGNOSIS — R7989 Other specified abnormal findings of blood chemistry: Secondary | ICD-10-CM | POA: Diagnosis not present

## 2023-02-21 LAB — CBC AND DIFFERENTIAL
HCT: 32 — AB (ref 36–46)
Hemoglobin: 10.5 — AB (ref 12.0–16.0)
Neutrophils Absolute: 5
Platelets: 163 10*3/uL (ref 150–400)
WBC: 9.3

## 2023-02-21 LAB — CBC: RBC: 3.7 — AB (ref 3.87–5.11)

## 2023-02-22 DIAGNOSIS — R278 Other lack of coordination: Secondary | ICD-10-CM | POA: Diagnosis not present

## 2023-02-22 DIAGNOSIS — I69354 Hemiplegia and hemiparesis following cerebral infarction affecting left non-dominant side: Secondary | ICD-10-CM | POA: Diagnosis not present

## 2023-02-22 DIAGNOSIS — M6389 Disorders of muscle in diseases classified elsewhere, multiple sites: Secondary | ICD-10-CM | POA: Diagnosis not present

## 2023-02-22 DIAGNOSIS — I6621 Occlusion and stenosis of right posterior cerebral artery: Secondary | ICD-10-CM | POA: Diagnosis not present

## 2023-02-22 DIAGNOSIS — I6521 Occlusion and stenosis of right carotid artery: Secondary | ICD-10-CM | POA: Diagnosis not present

## 2023-02-24 DIAGNOSIS — I69354 Hemiplegia and hemiparesis following cerebral infarction affecting left non-dominant side: Secondary | ICD-10-CM | POA: Diagnosis not present

## 2023-02-24 DIAGNOSIS — I6521 Occlusion and stenosis of right carotid artery: Secondary | ICD-10-CM | POA: Diagnosis not present

## 2023-02-24 DIAGNOSIS — I6621 Occlusion and stenosis of right posterior cerebral artery: Secondary | ICD-10-CM | POA: Diagnosis not present

## 2023-02-24 DIAGNOSIS — R278 Other lack of coordination: Secondary | ICD-10-CM | POA: Diagnosis not present

## 2023-02-24 DIAGNOSIS — M6389 Disorders of muscle in diseases classified elsewhere, multiple sites: Secondary | ICD-10-CM | POA: Diagnosis not present

## 2023-02-26 DIAGNOSIS — R278 Other lack of coordination: Secondary | ICD-10-CM | POA: Diagnosis not present

## 2023-02-26 DIAGNOSIS — I6521 Occlusion and stenosis of right carotid artery: Secondary | ICD-10-CM | POA: Diagnosis not present

## 2023-02-26 DIAGNOSIS — I6621 Occlusion and stenosis of right posterior cerebral artery: Secondary | ICD-10-CM | POA: Diagnosis not present

## 2023-02-26 DIAGNOSIS — M6389 Disorders of muscle in diseases classified elsewhere, multiple sites: Secondary | ICD-10-CM | POA: Diagnosis not present

## 2023-02-26 DIAGNOSIS — I69354 Hemiplegia and hemiparesis following cerebral infarction affecting left non-dominant side: Secondary | ICD-10-CM | POA: Diagnosis not present

## 2023-02-28 DIAGNOSIS — I6521 Occlusion and stenosis of right carotid artery: Secondary | ICD-10-CM | POA: Diagnosis not present

## 2023-02-28 DIAGNOSIS — I6621 Occlusion and stenosis of right posterior cerebral artery: Secondary | ICD-10-CM | POA: Diagnosis not present

## 2023-02-28 DIAGNOSIS — R278 Other lack of coordination: Secondary | ICD-10-CM | POA: Diagnosis not present

## 2023-02-28 DIAGNOSIS — M6389 Disorders of muscle in diseases classified elsewhere, multiple sites: Secondary | ICD-10-CM | POA: Diagnosis not present

## 2023-02-28 DIAGNOSIS — I69354 Hemiplegia and hemiparesis following cerebral infarction affecting left non-dominant side: Secondary | ICD-10-CM | POA: Diagnosis not present

## 2023-03-02 ENCOUNTER — Ambulatory Visit (INDEPENDENT_AMBULATORY_CARE_PROVIDER_SITE_OTHER): Payer: Medicare Other

## 2023-03-02 DIAGNOSIS — I6521 Occlusion and stenosis of right carotid artery: Secondary | ICD-10-CM | POA: Diagnosis not present

## 2023-03-02 DIAGNOSIS — I442 Atrioventricular block, complete: Secondary | ICD-10-CM | POA: Diagnosis not present

## 2023-03-02 DIAGNOSIS — R278 Other lack of coordination: Secondary | ICD-10-CM | POA: Diagnosis not present

## 2023-03-02 DIAGNOSIS — M6389 Disorders of muscle in diseases classified elsewhere, multiple sites: Secondary | ICD-10-CM | POA: Diagnosis not present

## 2023-03-02 DIAGNOSIS — I6621 Occlusion and stenosis of right posterior cerebral artery: Secondary | ICD-10-CM | POA: Diagnosis not present

## 2023-03-02 DIAGNOSIS — I69354 Hemiplegia and hemiparesis following cerebral infarction affecting left non-dominant side: Secondary | ICD-10-CM | POA: Diagnosis not present

## 2023-03-02 LAB — CUP PACEART REMOTE DEVICE CHECK
Battery Remaining Longevity: 105 mo
Battery Remaining Percentage: 77 %
Battery Voltage: 3.01 V
Brady Statistic RV Percent Paced: 8.6 %
Date Time Interrogation Session: 20240426020023
Implantable Lead Connection Status: 753985
Implantable Lead Implant Date: 20200720
Implantable Lead Location: 753860
Implantable Pulse Generator Implant Date: 20200720
Lead Channel Impedance Value: 510 Ohm
Lead Channel Pacing Threshold Amplitude: 1 V
Lead Channel Pacing Threshold Pulse Width: 0.5 ms
Lead Channel Sensing Intrinsic Amplitude: 5.6 mV
Lead Channel Setting Pacing Amplitude: 2.5 V
Lead Channel Setting Pacing Pulse Width: 0.5 ms
Lead Channel Setting Sensing Sensitivity: 0.5 mV
Pulse Gen Model: 1272
Pulse Gen Serial Number: 9122450

## 2023-03-05 DIAGNOSIS — M6389 Disorders of muscle in diseases classified elsewhere, multiple sites: Secondary | ICD-10-CM | POA: Diagnosis not present

## 2023-03-05 DIAGNOSIS — I69354 Hemiplegia and hemiparesis following cerebral infarction affecting left non-dominant side: Secondary | ICD-10-CM | POA: Diagnosis not present

## 2023-03-05 DIAGNOSIS — R278 Other lack of coordination: Secondary | ICD-10-CM | POA: Diagnosis not present

## 2023-03-05 DIAGNOSIS — I6621 Occlusion and stenosis of right posterior cerebral artery: Secondary | ICD-10-CM | POA: Diagnosis not present

## 2023-03-05 DIAGNOSIS — I6521 Occlusion and stenosis of right carotid artery: Secondary | ICD-10-CM | POA: Diagnosis not present

## 2023-03-06 ENCOUNTER — Encounter: Payer: Self-pay | Admitting: Orthopedic Surgery

## 2023-03-06 ENCOUNTER — Non-Acute Institutional Stay (SKILLED_NURSING_FACILITY): Payer: Medicare Other | Admitting: Orthopedic Surgery

## 2023-03-06 ENCOUNTER — Other Ambulatory Visit: Payer: Self-pay | Admitting: Orthopedic Surgery

## 2023-03-06 DIAGNOSIS — I69354 Hemiplegia and hemiparesis following cerebral infarction affecting left non-dominant side: Secondary | ICD-10-CM | POA: Diagnosis not present

## 2023-03-06 DIAGNOSIS — R1312 Dysphagia, oropharyngeal phase: Secondary | ICD-10-CM

## 2023-03-06 DIAGNOSIS — M79672 Pain in left foot: Secondary | ICD-10-CM | POA: Diagnosis not present

## 2023-03-06 DIAGNOSIS — I700 Atherosclerosis of aorta: Secondary | ICD-10-CM | POA: Diagnosis not present

## 2023-03-06 DIAGNOSIS — R630 Anorexia: Secondary | ICD-10-CM

## 2023-03-06 DIAGNOSIS — R634 Abnormal weight loss: Secondary | ICD-10-CM

## 2023-03-06 DIAGNOSIS — J3489 Other specified disorders of nose and nasal sinuses: Secondary | ICD-10-CM

## 2023-03-06 DIAGNOSIS — D509 Iron deficiency anemia, unspecified: Secondary | ICD-10-CM

## 2023-03-06 DIAGNOSIS — I4821 Permanent atrial fibrillation: Secondary | ICD-10-CM | POA: Diagnosis not present

## 2023-03-06 DIAGNOSIS — L602 Onychogryphosis: Secondary | ICD-10-CM | POA: Diagnosis not present

## 2023-03-06 DIAGNOSIS — M79671 Pain in right foot: Secondary | ICD-10-CM | POA: Diagnosis not present

## 2023-03-06 MED ORDER — SALINE SPRAY 0.65 % NA SOLN: 1.0000 | Freq: Two times a day (BID) | NASAL | 0 refills | Status: DC

## 2023-03-06 NOTE — Progress Notes (Signed)
[facility] Room Number: 118-A Place of Service:  SNF [phone]) Provider: Hazle Nordmann, NP   PCP: Mahlon Gammon, MD  Patient Care Team: Mahlon Gammon, MD as PCP - General (Internal Medicine) Marinus Maw, MD as PCP - Electrophysiology (Cardiology) Mathews Robinsons, MD as Consulting Physician (Dermatology) Marinus Maw, MD as Consulting Physician (Cardiology) [facility] Jeanella Craze, MD as Consulting Physician (Dermatology)  Extended Emergency Contact Information Primary Emergency Contact: Lynnell Grain of [place] Home Phone: [phone] Mobile Phone: [phone] Relation: Daughter Secondary Emergency Contact: Claud Kelp of [place] Home Phone: [phone] Mobile Phone: [phone] Relation: Son  Code Status:  DNR Goals of care: Advanced Directive information    03/06/2023   10:15 AM  Advanced Directives  Does Patient Have a Medical Advance Directive? Yes  Type of Estate agent of [place];Living will;Out of facility DNR (pink MOST or yellow form)  Does patient want to make changes to medical advance directive? No - Patient declined  Copy of Healthcare Power of Attorney in Chart? Yes - validated most recent copy scanned in chart (See row information)     Chief Complaint  Patient presents with   Medical Management of Chronic Issues    Routine Visit.    Immunizations    Discuss the need for DTAP vaccine, and Covid Booster.     HPI:  Pt is a 89 y.o. female seen today for medical management of chronic diseases.    She currently resides on the skilled nursing unit at [facility]. 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      Hemiparesis left side- hospitalized 11/21-11/27 with right large PCA infarct s/p TNK, resides in skilled nursing, dependent with ADLs, daughter requesting PT evaluation> would like to see if she could use sit to stand for toileting Oropharyngeal dysphagia- no recent aspirations, discharged from ST due to minimal progression, remains on pureed foods and honey thick liquids, daughter reports stable swallowing> no coughing/ difficulty, discussed QOL versus pureed diet, ST consult requested PAF-  HR< 100 with atenolol and digoxin, not on anticoagulation due to past h/o GI bleed with coumadin and ruptured bakers cyst with Eliquis, remains on baby asa daily  CAD- see above, h/o CVA, off statin due to elevated LFTs Iron deficiency anemia- hgb 10.5, hct 31.5, mcv 84.9 02/20/2022,remains on liquid MVI with iron Weight loss/ poor appetite- ongoing, eats magic cups well but not all meals, daughter concerned medications affecting appetite> nursing reports they administer medications 1-2 hours after meals, see weight trends below Rhinorrhea- past use of Atrovent, continues to ask for tissue due to runny nose, remains on saline nasal spray and Xyzal  No recent falls or injuries.   Seen by podiatry today.   Recent blood pressures:  04/30- 136/79  04/23- 134/81  04/21- 136/69  Recent weights:  04/01- 128 lbs  03/05- 124.4 lbs  02/02- 121.2 lbs  09/2022- 130 lbs   Past Medical History:  Diagnosis Date   Actinic keratosis 05/25/2014   Anxiety    Atrial fibrillation (HCC) 09/21/2010   Basal cell carcinoma 05/25/2014   Multiple removed by Dr. Arminda Resides in March 2015: right neck, left neck, scalp    Bell's palsy 07/18/1979   Cervicalgia 01/31/2012   Closed fracture of lumbar  vertebra without mention of spinal cord injury 07/17/2005   Conjunctiva disorder 12/26/2010   Coronary atherosclerosis of native coronary artery 07/17/1997   Cramp of limb 08/16/2009   Degeneration of lumbar or lumbosacral  intervertebral disc 01/31/2012   Disturbance of skin sensation 08/2009   Dizziness and giddiness 11/08/2011   vertigo   External hemorrhoids without mention of complication 06/27/2010   External hemorrhoids without mention of complication 08/26/2012   Ganglion of tendon sheath 08/16/2009   Leg cramp 10/27/2013   Most frequently the right leg.    Long term (current) use of anticoagulants 09/2010   Lumbago 07/2009   Meralgia paresthetica 07/2007   MI, old    Myalgia and myositis, unspecified 01/17/2012   Osteoporosis    Other abnormal blood chemistry 04/08/2012   Other abnormal blood chemistry 2013   hyperglycemia   Other disorder of muscle, ligament, and fascia 04/08/2012   Other specified cardiac dysrhythmias(427.89) 06/27/2010   Pacemaker 05/06/2020   Pain in joint, ankle and foot 10/27/2013   Bilateral since 1998    Pain in joint, shoulder region 08/12/2012   Pain in joint, upper arm 12/26/2010   Pain in limb 01/11/2011   Pain in thoracic spine 01/31/2012   Pathologic fracture of vertebrae 01/22/2012   PVC's (premature ventricular contractions)    Rash and other nonspecific skin eruption 08/02/2011   Senile osteoporosis 07/17/1993   Stroke (HCC)    Unspecified essential hypertension 07/17/1997   Varicose veins of lower extremities 08/16/2009   Varicose veins of lower extremities 08/16/2009   Past Surgical History:  Procedure Laterality Date   COLONOSCOPY WITH PROPOFOL N/A 10/19/2015   Procedure: COLONOSCOPY WITH PROPOFOL;  Surgeon: Iva Boop, MD;  Location: [facility];  Service: Endoscopy;  Laterality: N/A;   CORONARY ARTERY BYPASS GRAFT  1998   x2; LIMA to LAD; SVG to diagonal off bypass   HEMORRHOID SURGERY  08/26/2012   Dr. Luisa Hart   LOOP RECORDER INSERTION N/A 05/13/2018   Procedure: LOOP RECORDER INSERTION;  Surgeon: Marinus Maw, MD;  Location: [facility] INVASIVE CV LAB;  Service: Cardiovascular;  Laterality: N/A;   LOOP RECORDER REMOVAL N/A 05/26/2019    Procedure: LOOP RECORDER REMOVAL;  Surgeon: Marinus Maw, MD;  Location: [facility] INVASIVE CV LAB;  Service: Cardiovascular;  Laterality: N/A;   MASS EXCISION Left 11/23/2015   Procedure: LEFT WRIST EXCISION CYST;  Surgeon: Betha Loa, MD;  Location: [facility];  Service: Orthopedics;  Laterality: Left;   PACEMAKER IMPLANT N/A 05/26/2019   Procedure: PACEMAKER IMPLANT;  Surgeon: Marinus Maw, MD;  Location: [facility] INVASIVE CV LAB;  Service: Cardiovascular;  Laterality: N/A;   SKIN BIOPSY  01/29/14   (R) neck; (R) scalp, 2 (L) neck; shave biopsy Superficial basal cell carcinoma Dr. Arminda Resides   TONSILLECTOMY  [phone]    Allergies  Allergen Reactions   Iodinated Contrast Media Nausea Only and Other (See Comments)    Severe nausea and also passed out   Iodine Nausea Only and Other (See Comments)    "Allergic," per [facility]- PASSED OUT   Lodine [Etodolac] Other (See Comments)    "Allergic," per [facility]   Other Other (See Comments)    Seasonal Allergies   Bactrim Rash   Relafen [Nabumetone] Rash   Sulfamethoxazole-Trimethoprim Rash    In matrix   Sulfanilamide Rash    Allergies as of 03/06/2023       Reactions   Iodinated Contrast Media Nausea Only, Other (See Comments)   Severe nausea  and also passed out   Iodine Nausea Only, Other (See Comments)   "Allergic," per [facility]- PASSED OUT   Lodine [etodolac] Other (See Comments)   "Allergic," per [facility]   Other Other (See Comments)   Seasonal Allergies   Bactrim Rash   Relafen [nabumetone] Rash   Sulfamethoxazole-trimethoprim Rash   In matrix   Sulfanilamide Rash        Medication List        Accurate as of March 06, 2023 10:17 AM. If you have any questions, ask your nurse or doctor.          acetaminophen 325 MG tablet Commonly known as: TYLENOL Take 650 mg by mouth 3 (three) times daily as needed (for pain).   amLODipine 5 MG tablet Commonly known as: NORVASC Take 5 mg by mouth daily.   Aspirin Low Dose 81 MG  chewable tablet Generic drug: aspirin Chew 1 tablet (81 mg total) by mouth daily.   atenolol 50 MG tablet Commonly known as: TENORMIN Take 1 tablet (50 mg total) by mouth 2 (two) times daily.   Biotene Dry Mouth Moisturizing Soln Use as directed 1-2 sprays in the mouth or throat 3 (three) times daily.   CALCIUM 500 + D3 PO Take 1 capsule by mouth in the morning.   diclofenac Sodium 1 % Gel Commonly known as: VOLTAREN Apply 2 g topically as needed (BID PRN).   digoxin 0.125 MG tablet Commonly known as: LANOXIN Take 1 tablet (0.125 mg total) by mouth every other day.   docusate 50 MG/5ML liquid Commonly known as: COLACE Take 100 mg by mouth daily.   hydrALAZINE 10 MG tablet Commonly known as: APRESOLINE Take 10 mg by mouth 3 (three) times daily as needed (SBP>180).   levocetirizine 5 MG tablet Commonly known as: XYZAL Take 5 mg by mouth at bedtime.   losartan 25 MG tablet Commonly known as: COZAAR Take 25 mg by mouth daily.   multivitamin with iron-minerals liquid Take 15 mLs by mouth daily.   polyethylene glycol 17 g packet Commonly known as: MIRALAX / GLYCOLAX Take 17 g by mouth See admin instructions. Mix 17 grams of powder into 4-6 oz of beverage of choice and drink once a day   sodium chloride 0.65 % nasal spray Commonly known as: OCEAN Place 1 spray into the nose 4 (four) times daily as needed for congestion.        Review of Systems  Unable to perform ROS: Other (aphasia)    Immunization History  Administered Date(s) Administered   Influenza Whole 08/06/2012   Influenza, High Dose Seasonal PF 08/15/2019, 08/27/2020   Influenza,inj,Quad PF,6+ Mos 08/30/2018   Influenza-Unspecified 08/06/2013, 08/24/2014, 08/26/2015, 08/31/2016, 08/27/2017, 08/27/2020   Moderna Sars-Covid-2 Vaccination 11/16/2019, 12/16/2019, 09/16/2020   Pneumococcal Conjugate-13 09/22/2015   Pneumococcal Polysaccharide-23 08/29/2006   Td 10/07/2007   Zoster Recombinat (Shingrix)  02/12/2018, 05/13/2018   Zoster, Live 03/12/2008   Pertinent  Health Maintenance Due  Topic Date Due   INFLUENZA VACCINE  06/07/2023   DEXA SCAN  Completed      10/02/2022    8:00 AM 10/03/2022   10:49 AM 10/04/2022    9:50 AM 10/06/2022   10:31 AM 12/01/2022    9:28 AM  Fall Risk  Falls in the past year?  1 1 1 1   Was there an injury with Fall?  1 1 1 1   Fall Risk Category Calculator  3 3 2 2   Fall Risk Category (Retired)  High High Moderate   (  RETIRED) Patient Fall Risk Level High fall risk High fall risk High fall risk High fall risk   Patient at Risk for Falls Due to  History of fall(s) History of fall(s) History of fall(s) History of fall(s)  Fall risk Follow up  Falls evaluation completed Falls evaluation completed Falls evaluation completed Falls evaluation completed   Functional Status Survey:    Vitals:   03/06/23 1009  BP: 136/79  Pulse: 70  Resp: 17  Temp: 97.9 F (36.6 C)  SpO2: (!) 73%  Weight: 128 lb (58.1 kg)  Height: 5\' 6"  (1.676 m)   Body mass index is 20.66 kg/m. Physical Exam Vitals reviewed.  Constitutional:      General: She is not in acute distress. HENT:     Head: Normocephalic.     Right Ear: There is no impacted cerumen.     Left Ear: There is no impacted cerumen.     Nose: Rhinorrhea present.     Mouth/Throat:     Mouth: Mucous membranes are moist.  Eyes:     General:        Right eye: No discharge.        Left eye: No discharge.  Cardiovascular:     Rate and Rhythm: Normal rate. Rhythm irregular.     Pulses: Normal pulses.     Heart sounds: Normal heart sounds.  Pulmonary:     Effort: Pulmonary effort is normal. No respiratory distress.     Breath sounds: Normal breath sounds. No wheezing.  Abdominal:     General: Bowel sounds are normal. There is no distension.     Palpations: Abdomen is soft.     Tenderness: There is no abdominal tenderness.  Musculoskeletal:     Cervical back: Neck supple.     Right lower leg: No edema.      Left lower leg: No edema.  Skin:    General: Skin is warm and dry.     Capillary Refill: Capillary refill takes less than 2 seconds.  Neurological:     General: No focal deficit present.     Mental Status: She is alert. Mental status is at baseline.     Motor: Weakness present.     Gait: Gait abnormal.     Comments: Left sided hemiparesis  Psychiatric:        Mood and Affect: Mood normal.     Comments: Aphasia with slowed speech, does not follow all commands     Labs reviewed: Recent Labs    09/30/22 0931 10/01/22 0806 10/02/22 0900 10/21/22 0000 12/07/22 0000 12/11/22 0000 01/02/23 0000  NA 141 142 142   < > 139 144 141  K 3.6 3.3* 3.9   < > 4.0 3.9 4.0  CL 108 111 116*   < > 104 108 108  CO2 22 20* 19*   < > 21 22 23*  GLUCOSE 107* 114* 108*  --   --   --   --   BUN 34* 36* 35*   < > 82* 59* 38*  CREATININE 1.25* 1.21* 1.09*   < > 1.3* 1.1 1.1  CALCIUM 8.5* 8.2* 8.2*   < > 8.9 8.8 8.7   < > = values in this interval not displayed.   Recent Labs    09/26/22 1539 10/21/22 0000 12/07/22 0000 12/11/22 0000 01/02/23 0000  AST 17   < > 58* 60* 21  ALT 12   < > 70* 73* 21  ALKPHOS 71   < >  99 94 80  BILITOT 0.3  --   --   --   --   PROT 7.1  --   --   --   --   ALBUMIN 3.1*   < > 3.1* 2.9* 3.1*   < > = values in this interval not displayed.   Recent Labs    09/30/22 0931 10/01/22 0806 10/02/22 0900 10/09/22 0000 10/12/22 0000 10/16/22 0000 10/20/22 0000 10/31/22 0000 12/08/22 0000 01/02/23 0000  WBC 10.7* 11.0* 9.9 18.8 17.7 15.1   < > 11.4 10.4 7.0  NEUTROABS  --   --   --  11.10 11.60 9.40  --   --   --   --   HGB 9.2* 8.5* 8.8* 9.3* 9.7* 8.6*   < > 8.2* 8.3* 8.8*  HCT 28.9* 25.7* 27.2* 29* 30* 27*   < > 25* 25* 27*  MCV 88.1 87.1 86.6  --   --   --   --   --   --   --   PLT 96* 87* 109* 179 210 217   < > 103* 187 162   < > = values in this interval not displayed.   Lab Results  Component Value Date   TSH 2.87 02/03/2022   Lab Results   Component Value Date   HGBA1C 5.7 (H) 09/26/2022   Lab Results  Component Value Date   CHOL 161 09/27/2022   HDL 39 (L) 09/27/2022   LDLCALC 114 (H) 09/27/2022   LDLDIRECT 81.3 08/19/2020   TRIG 39 09/27/2022   CHOLHDL 4.1 09/27/2022    Significant Diagnostic Results in last 30 days:  CUP PACEART REMOTE DEVICE CHECK  Result Date: 03/02/2023 Scheduled remote reviewed. Normal device function.  Next remote 91 days. LA, CVRS   Assessment/Plan 1. 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 Oropharyngeal dysphagia - no recent aspirations - QOL versus pureed diet discussed - cont pureed diet with honey thick liquids - ST evaluation  3. Permanent atrial fibrillation (HCC) - HR< 100 with atenolol and digoxin - off anticoagulation due to h/o GI bleed with coumadin, ruptured Bakers cyst with Eliquis - cont baby asa  4. Iron deficiency anemia, unspecified iron deficiency anemia type - hgb stable - off ferrous sulfate - cont oral MVI with iron   5. Poor appetite - ongoing - multifactorial > advanced age, CVA 09/2022, dysphagia - nursing is giving medications 1-2 hours after meals - dietary working with patient and family to promote eating> listing favorite foods  6. Weight loss - BMI 20.66 - weights stable - cont monthly weights  7. Rhinorrhea - ongoing - past use of Atrovent nasal spray - recommend saline nasal spray every morning and afternoon - cont Xyzal   8. Aortic atherosclerosis (HCC) - noted CT abdomen 2021    Family/ staff Communication: plan discussed with patient, daughter and nurse  Labs/tests ordered:  PT/ ST evaluation

## 2023-03-08 DIAGNOSIS — R278 Other lack of coordination: Secondary | ICD-10-CM | POA: Diagnosis not present

## 2023-03-08 DIAGNOSIS — M6389 Disorders of muscle in diseases classified elsewhere, multiple sites: Secondary | ICD-10-CM | POA: Diagnosis not present

## 2023-03-08 DIAGNOSIS — I6621 Occlusion and stenosis of right posterior cerebral artery: Secondary | ICD-10-CM | POA: Diagnosis not present

## 2023-03-08 DIAGNOSIS — I69354 Hemiplegia and hemiparesis following cerebral infarction affecting left non-dominant side: Secondary | ICD-10-CM | POA: Diagnosis not present

## 2023-03-08 DIAGNOSIS — I6521 Occlusion and stenosis of right carotid artery: Secondary | ICD-10-CM | POA: Diagnosis not present

## 2023-03-09 DIAGNOSIS — I6521 Occlusion and stenosis of right carotid artery: Secondary | ICD-10-CM | POA: Diagnosis not present

## 2023-03-09 DIAGNOSIS — I69354 Hemiplegia and hemiparesis following cerebral infarction affecting left non-dominant side: Secondary | ICD-10-CM | POA: Diagnosis not present

## 2023-03-09 DIAGNOSIS — I69322 Dysarthria following cerebral infarction: Secondary | ICD-10-CM | POA: Diagnosis not present

## 2023-03-09 DIAGNOSIS — R278 Other lack of coordination: Secondary | ICD-10-CM | POA: Diagnosis not present

## 2023-03-09 DIAGNOSIS — M2569 Stiffness of other specified joint, not elsewhere classified: Secondary | ICD-10-CM | POA: Diagnosis not present

## 2023-03-09 DIAGNOSIS — M6389 Disorders of muscle in diseases classified elsewhere, multiple sites: Secondary | ICD-10-CM | POA: Diagnosis not present

## 2023-03-09 DIAGNOSIS — M158 Other polyosteoarthritis: Secondary | ICD-10-CM | POA: Diagnosis not present

## 2023-03-09 DIAGNOSIS — I69391 Dysphagia following cerebral infarction: Secondary | ICD-10-CM | POA: Diagnosis not present

## 2023-03-09 DIAGNOSIS — I6621 Occlusion and stenosis of right posterior cerebral artery: Secondary | ICD-10-CM | POA: Diagnosis not present

## 2023-03-09 DIAGNOSIS — R1312 Dysphagia, oropharyngeal phase: Secondary | ICD-10-CM | POA: Diagnosis not present

## 2023-03-11 DIAGNOSIS — I69354 Hemiplegia and hemiparesis following cerebral infarction affecting left non-dominant side: Secondary | ICD-10-CM | POA: Diagnosis not present

## 2023-03-11 DIAGNOSIS — I69322 Dysarthria following cerebral infarction: Secondary | ICD-10-CM | POA: Diagnosis not present

## 2023-03-11 DIAGNOSIS — I69391 Dysphagia following cerebral infarction: Secondary | ICD-10-CM | POA: Diagnosis not present

## 2023-03-11 DIAGNOSIS — M2569 Stiffness of other specified joint, not elsewhere classified: Secondary | ICD-10-CM | POA: Diagnosis not present

## 2023-03-11 DIAGNOSIS — R1312 Dysphagia, oropharyngeal phase: Secondary | ICD-10-CM | POA: Diagnosis not present

## 2023-03-11 DIAGNOSIS — M158 Other polyosteoarthritis: Secondary | ICD-10-CM | POA: Diagnosis not present

## 2023-03-12 DIAGNOSIS — M2569 Stiffness of other specified joint, not elsewhere classified: Secondary | ICD-10-CM | POA: Diagnosis not present

## 2023-03-12 DIAGNOSIS — I69322 Dysarthria following cerebral infarction: Secondary | ICD-10-CM | POA: Diagnosis not present

## 2023-03-12 DIAGNOSIS — I69391 Dysphagia following cerebral infarction: Secondary | ICD-10-CM | POA: Diagnosis not present

## 2023-03-12 DIAGNOSIS — I6621 Occlusion and stenosis of right posterior cerebral artery: Secondary | ICD-10-CM | POA: Diagnosis not present

## 2023-03-12 DIAGNOSIS — R1312 Dysphagia, oropharyngeal phase: Secondary | ICD-10-CM | POA: Diagnosis not present

## 2023-03-12 DIAGNOSIS — M158 Other polyosteoarthritis: Secondary | ICD-10-CM | POA: Diagnosis not present

## 2023-03-12 DIAGNOSIS — I69354 Hemiplegia and hemiparesis following cerebral infarction affecting left non-dominant side: Secondary | ICD-10-CM | POA: Diagnosis not present

## 2023-03-12 DIAGNOSIS — R278 Other lack of coordination: Secondary | ICD-10-CM | POA: Diagnosis not present

## 2023-03-12 DIAGNOSIS — I6521 Occlusion and stenosis of right carotid artery: Secondary | ICD-10-CM | POA: Diagnosis not present

## 2023-03-12 DIAGNOSIS — M6389 Disorders of muscle in diseases classified elsewhere, multiple sites: Secondary | ICD-10-CM | POA: Diagnosis not present

## 2023-03-13 DIAGNOSIS — I6521 Occlusion and stenosis of right carotid artery: Secondary | ICD-10-CM | POA: Diagnosis not present

## 2023-03-13 DIAGNOSIS — I6621 Occlusion and stenosis of right posterior cerebral artery: Secondary | ICD-10-CM | POA: Diagnosis not present

## 2023-03-13 DIAGNOSIS — R278 Other lack of coordination: Secondary | ICD-10-CM | POA: Diagnosis not present

## 2023-03-13 DIAGNOSIS — I69354 Hemiplegia and hemiparesis following cerebral infarction affecting left non-dominant side: Secondary | ICD-10-CM | POA: Diagnosis not present

## 2023-03-13 DIAGNOSIS — M6389 Disorders of muscle in diseases classified elsewhere, multiple sites: Secondary | ICD-10-CM | POA: Diagnosis not present

## 2023-03-15 DIAGNOSIS — I6621 Occlusion and stenosis of right posterior cerebral artery: Secondary | ICD-10-CM | POA: Diagnosis not present

## 2023-03-15 DIAGNOSIS — M6389 Disorders of muscle in diseases classified elsewhere, multiple sites: Secondary | ICD-10-CM | POA: Diagnosis not present

## 2023-03-15 DIAGNOSIS — M158 Other polyosteoarthritis: Secondary | ICD-10-CM | POA: Diagnosis not present

## 2023-03-15 DIAGNOSIS — I69391 Dysphagia following cerebral infarction: Secondary | ICD-10-CM | POA: Diagnosis not present

## 2023-03-15 DIAGNOSIS — M2569 Stiffness of other specified joint, not elsewhere classified: Secondary | ICD-10-CM | POA: Diagnosis not present

## 2023-03-15 DIAGNOSIS — I69322 Dysarthria following cerebral infarction: Secondary | ICD-10-CM | POA: Diagnosis not present

## 2023-03-15 DIAGNOSIS — I69354 Hemiplegia and hemiparesis following cerebral infarction affecting left non-dominant side: Secondary | ICD-10-CM | POA: Diagnosis not present

## 2023-03-15 DIAGNOSIS — I6521 Occlusion and stenosis of right carotid artery: Secondary | ICD-10-CM | POA: Diagnosis not present

## 2023-03-15 DIAGNOSIS — R1312 Dysphagia, oropharyngeal phase: Secondary | ICD-10-CM | POA: Diagnosis not present

## 2023-03-15 DIAGNOSIS — R278 Other lack of coordination: Secondary | ICD-10-CM | POA: Diagnosis not present

## 2023-03-19 DIAGNOSIS — M158 Other polyosteoarthritis: Secondary | ICD-10-CM | POA: Diagnosis not present

## 2023-03-19 DIAGNOSIS — I69391 Dysphagia following cerebral infarction: Secondary | ICD-10-CM | POA: Diagnosis not present

## 2023-03-19 DIAGNOSIS — M2569 Stiffness of other specified joint, not elsewhere classified: Secondary | ICD-10-CM | POA: Diagnosis not present

## 2023-03-19 DIAGNOSIS — R1312 Dysphagia, oropharyngeal phase: Secondary | ICD-10-CM | POA: Diagnosis not present

## 2023-03-19 DIAGNOSIS — I69322 Dysarthria following cerebral infarction: Secondary | ICD-10-CM | POA: Diagnosis not present

## 2023-03-19 DIAGNOSIS — I69354 Hemiplegia and hemiparesis following cerebral infarction affecting left non-dominant side: Secondary | ICD-10-CM | POA: Diagnosis not present

## 2023-03-20 DIAGNOSIS — R278 Other lack of coordination: Secondary | ICD-10-CM | POA: Diagnosis not present

## 2023-03-20 DIAGNOSIS — I6521 Occlusion and stenosis of right carotid artery: Secondary | ICD-10-CM | POA: Diagnosis not present

## 2023-03-20 DIAGNOSIS — M6389 Disorders of muscle in diseases classified elsewhere, multiple sites: Secondary | ICD-10-CM | POA: Diagnosis not present

## 2023-03-20 DIAGNOSIS — I6621 Occlusion and stenosis of right posterior cerebral artery: Secondary | ICD-10-CM | POA: Diagnosis not present

## 2023-03-20 DIAGNOSIS — I69354 Hemiplegia and hemiparesis following cerebral infarction affecting left non-dominant side: Secondary | ICD-10-CM | POA: Diagnosis not present

## 2023-03-21 DIAGNOSIS — M158 Other polyosteoarthritis: Secondary | ICD-10-CM | POA: Diagnosis not present

## 2023-03-21 DIAGNOSIS — R1312 Dysphagia, oropharyngeal phase: Secondary | ICD-10-CM | POA: Diagnosis not present

## 2023-03-21 DIAGNOSIS — I69354 Hemiplegia and hemiparesis following cerebral infarction affecting left non-dominant side: Secondary | ICD-10-CM | POA: Diagnosis not present

## 2023-03-21 DIAGNOSIS — I69391 Dysphagia following cerebral infarction: Secondary | ICD-10-CM | POA: Diagnosis not present

## 2023-03-21 DIAGNOSIS — M2569 Stiffness of other specified joint, not elsewhere classified: Secondary | ICD-10-CM | POA: Diagnosis not present

## 2023-03-21 DIAGNOSIS — I69322 Dysarthria following cerebral infarction: Secondary | ICD-10-CM | POA: Diagnosis not present

## 2023-03-22 DIAGNOSIS — R1312 Dysphagia, oropharyngeal phase: Secondary | ICD-10-CM | POA: Diagnosis not present

## 2023-03-22 DIAGNOSIS — I69354 Hemiplegia and hemiparesis following cerebral infarction affecting left non-dominant side: Secondary | ICD-10-CM | POA: Diagnosis not present

## 2023-03-22 DIAGNOSIS — R278 Other lack of coordination: Secondary | ICD-10-CM | POA: Diagnosis not present

## 2023-03-22 DIAGNOSIS — I69322 Dysarthria following cerebral infarction: Secondary | ICD-10-CM | POA: Diagnosis not present

## 2023-03-22 DIAGNOSIS — M158 Other polyosteoarthritis: Secondary | ICD-10-CM | POA: Diagnosis not present

## 2023-03-22 DIAGNOSIS — I69391 Dysphagia following cerebral infarction: Secondary | ICD-10-CM | POA: Diagnosis not present

## 2023-03-22 DIAGNOSIS — M6389 Disorders of muscle in diseases classified elsewhere, multiple sites: Secondary | ICD-10-CM | POA: Diagnosis not present

## 2023-03-22 DIAGNOSIS — M2569 Stiffness of other specified joint, not elsewhere classified: Secondary | ICD-10-CM | POA: Diagnosis not present

## 2023-03-22 DIAGNOSIS — I6621 Occlusion and stenosis of right posterior cerebral artery: Secondary | ICD-10-CM | POA: Diagnosis not present

## 2023-03-22 DIAGNOSIS — I6521 Occlusion and stenosis of right carotid artery: Secondary | ICD-10-CM | POA: Diagnosis not present

## 2023-03-23 DIAGNOSIS — M2569 Stiffness of other specified joint, not elsewhere classified: Secondary | ICD-10-CM | POA: Diagnosis not present

## 2023-03-23 DIAGNOSIS — M158 Other polyosteoarthritis: Secondary | ICD-10-CM | POA: Diagnosis not present

## 2023-03-23 DIAGNOSIS — I69354 Hemiplegia and hemiparesis following cerebral infarction affecting left non-dominant side: Secondary | ICD-10-CM | POA: Diagnosis not present

## 2023-03-23 DIAGNOSIS — R1312 Dysphagia, oropharyngeal phase: Secondary | ICD-10-CM | POA: Diagnosis not present

## 2023-03-23 DIAGNOSIS — I69322 Dysarthria following cerebral infarction: Secondary | ICD-10-CM | POA: Diagnosis not present

## 2023-03-23 DIAGNOSIS — I69391 Dysphagia following cerebral infarction: Secondary | ICD-10-CM | POA: Diagnosis not present

## 2023-03-25 DIAGNOSIS — R1312 Dysphagia, oropharyngeal phase: Secondary | ICD-10-CM | POA: Diagnosis not present

## 2023-03-25 DIAGNOSIS — I69391 Dysphagia following cerebral infarction: Secondary | ICD-10-CM | POA: Diagnosis not present

## 2023-03-25 DIAGNOSIS — I69322 Dysarthria following cerebral infarction: Secondary | ICD-10-CM | POA: Diagnosis not present

## 2023-03-25 DIAGNOSIS — M2569 Stiffness of other specified joint, not elsewhere classified: Secondary | ICD-10-CM | POA: Diagnosis not present

## 2023-03-25 DIAGNOSIS — I69354 Hemiplegia and hemiparesis following cerebral infarction affecting left non-dominant side: Secondary | ICD-10-CM | POA: Diagnosis not present

## 2023-03-25 DIAGNOSIS — M158 Other polyosteoarthritis: Secondary | ICD-10-CM | POA: Diagnosis not present

## 2023-03-26 DIAGNOSIS — I69354 Hemiplegia and hemiparesis following cerebral infarction affecting left non-dominant side: Secondary | ICD-10-CM | POA: Diagnosis not present

## 2023-03-26 DIAGNOSIS — R1312 Dysphagia, oropharyngeal phase: Secondary | ICD-10-CM | POA: Diagnosis not present

## 2023-03-26 DIAGNOSIS — I69391 Dysphagia following cerebral infarction: Secondary | ICD-10-CM | POA: Diagnosis not present

## 2023-03-26 DIAGNOSIS — M158 Other polyosteoarthritis: Secondary | ICD-10-CM | POA: Diagnosis not present

## 2023-03-26 DIAGNOSIS — I69322 Dysarthria following cerebral infarction: Secondary | ICD-10-CM | POA: Diagnosis not present

## 2023-03-26 DIAGNOSIS — M2569 Stiffness of other specified joint, not elsewhere classified: Secondary | ICD-10-CM | POA: Diagnosis not present

## 2023-03-27 DIAGNOSIS — I69322 Dysarthria following cerebral infarction: Secondary | ICD-10-CM | POA: Diagnosis not present

## 2023-03-27 DIAGNOSIS — M158 Other polyosteoarthritis: Secondary | ICD-10-CM | POA: Diagnosis not present

## 2023-03-27 DIAGNOSIS — M2569 Stiffness of other specified joint, not elsewhere classified: Secondary | ICD-10-CM | POA: Diagnosis not present

## 2023-03-27 DIAGNOSIS — I69391 Dysphagia following cerebral infarction: Secondary | ICD-10-CM | POA: Diagnosis not present

## 2023-03-27 DIAGNOSIS — R1312 Dysphagia, oropharyngeal phase: Secondary | ICD-10-CM | POA: Diagnosis not present

## 2023-03-27 DIAGNOSIS — I69354 Hemiplegia and hemiparesis following cerebral infarction affecting left non-dominant side: Secondary | ICD-10-CM | POA: Diagnosis not present

## 2023-03-28 DIAGNOSIS — R278 Other lack of coordination: Secondary | ICD-10-CM | POA: Diagnosis not present

## 2023-03-28 DIAGNOSIS — I69391 Dysphagia following cerebral infarction: Secondary | ICD-10-CM | POA: Diagnosis not present

## 2023-03-28 DIAGNOSIS — M6389 Disorders of muscle in diseases classified elsewhere, multiple sites: Secondary | ICD-10-CM | POA: Diagnosis not present

## 2023-03-28 DIAGNOSIS — R1312 Dysphagia, oropharyngeal phase: Secondary | ICD-10-CM | POA: Diagnosis not present

## 2023-03-28 DIAGNOSIS — M158 Other polyosteoarthritis: Secondary | ICD-10-CM | POA: Diagnosis not present

## 2023-03-28 DIAGNOSIS — I69354 Hemiplegia and hemiparesis following cerebral infarction affecting left non-dominant side: Secondary | ICD-10-CM | POA: Diagnosis not present

## 2023-03-28 DIAGNOSIS — M2569 Stiffness of other specified joint, not elsewhere classified: Secondary | ICD-10-CM | POA: Diagnosis not present

## 2023-03-28 DIAGNOSIS — I69322 Dysarthria following cerebral infarction: Secondary | ICD-10-CM | POA: Diagnosis not present

## 2023-03-28 DIAGNOSIS — I6621 Occlusion and stenosis of right posterior cerebral artery: Secondary | ICD-10-CM | POA: Diagnosis not present

## 2023-03-28 DIAGNOSIS — I6521 Occlusion and stenosis of right carotid artery: Secondary | ICD-10-CM | POA: Diagnosis not present

## 2023-03-30 DIAGNOSIS — R278 Other lack of coordination: Secondary | ICD-10-CM | POA: Diagnosis not present

## 2023-03-30 DIAGNOSIS — I6621 Occlusion and stenosis of right posterior cerebral artery: Secondary | ICD-10-CM | POA: Diagnosis not present

## 2023-03-30 DIAGNOSIS — I6521 Occlusion and stenosis of right carotid artery: Secondary | ICD-10-CM | POA: Diagnosis not present

## 2023-03-30 DIAGNOSIS — M6389 Disorders of muscle in diseases classified elsewhere, multiple sites: Secondary | ICD-10-CM | POA: Diagnosis not present

## 2023-03-30 DIAGNOSIS — I69354 Hemiplegia and hemiparesis following cerebral infarction affecting left non-dominant side: Secondary | ICD-10-CM | POA: Diagnosis not present

## 2023-04-03 DIAGNOSIS — I69322 Dysarthria following cerebral infarction: Secondary | ICD-10-CM | POA: Diagnosis not present

## 2023-04-03 DIAGNOSIS — I69354 Hemiplegia and hemiparesis following cerebral infarction affecting left non-dominant side: Secondary | ICD-10-CM | POA: Diagnosis not present

## 2023-04-03 DIAGNOSIS — I69391 Dysphagia following cerebral infarction: Secondary | ICD-10-CM | POA: Diagnosis not present

## 2023-04-03 DIAGNOSIS — M2569 Stiffness of other specified joint, not elsewhere classified: Secondary | ICD-10-CM | POA: Diagnosis not present

## 2023-04-03 DIAGNOSIS — R1312 Dysphagia, oropharyngeal phase: Secondary | ICD-10-CM | POA: Diagnosis not present

## 2023-04-03 DIAGNOSIS — M158 Other polyosteoarthritis: Secondary | ICD-10-CM | POA: Diagnosis not present

## 2023-04-04 DIAGNOSIS — M2569 Stiffness of other specified joint, not elsewhere classified: Secondary | ICD-10-CM | POA: Diagnosis not present

## 2023-04-04 DIAGNOSIS — R278 Other lack of coordination: Secondary | ICD-10-CM | POA: Diagnosis not present

## 2023-04-04 DIAGNOSIS — I69322 Dysarthria following cerebral infarction: Secondary | ICD-10-CM | POA: Diagnosis not present

## 2023-04-04 DIAGNOSIS — R1312 Dysphagia, oropharyngeal phase: Secondary | ICD-10-CM | POA: Diagnosis not present

## 2023-04-04 DIAGNOSIS — I6621 Occlusion and stenosis of right posterior cerebral artery: Secondary | ICD-10-CM | POA: Diagnosis not present

## 2023-04-04 DIAGNOSIS — M158 Other polyosteoarthritis: Secondary | ICD-10-CM | POA: Diagnosis not present

## 2023-04-04 DIAGNOSIS — I69391 Dysphagia following cerebral infarction: Secondary | ICD-10-CM | POA: Diagnosis not present

## 2023-04-04 DIAGNOSIS — I6521 Occlusion and stenosis of right carotid artery: Secondary | ICD-10-CM | POA: Diagnosis not present

## 2023-04-04 DIAGNOSIS — I69354 Hemiplegia and hemiparesis following cerebral infarction affecting left non-dominant side: Secondary | ICD-10-CM | POA: Diagnosis not present

## 2023-04-04 DIAGNOSIS — M6389 Disorders of muscle in diseases classified elsewhere, multiple sites: Secondary | ICD-10-CM | POA: Diagnosis not present

## 2023-04-05 ENCOUNTER — Non-Acute Institutional Stay (SKILLED_NURSING_FACILITY): Payer: Medicare Other | Admitting: Adult Health

## 2023-04-05 DIAGNOSIS — R1312 Dysphagia, oropharyngeal phase: Secondary | ICD-10-CM | POA: Diagnosis not present

## 2023-04-05 DIAGNOSIS — M2569 Stiffness of other specified joint, not elsewhere classified: Secondary | ICD-10-CM | POA: Diagnosis not present

## 2023-04-05 DIAGNOSIS — M158 Other polyosteoarthritis: Secondary | ICD-10-CM | POA: Diagnosis not present

## 2023-04-05 DIAGNOSIS — K036 Deposits [accretions] on teeth: Secondary | ICD-10-CM | POA: Diagnosis not present

## 2023-04-05 DIAGNOSIS — T148XXA Other injury of unspecified body region, initial encounter: Secondary | ICD-10-CM | POA: Diagnosis not present

## 2023-04-05 DIAGNOSIS — I69354 Hemiplegia and hemiparesis following cerebral infarction affecting left non-dominant side: Secondary | ICD-10-CM

## 2023-04-05 DIAGNOSIS — I69391 Dysphagia following cerebral infarction: Secondary | ICD-10-CM | POA: Diagnosis not present

## 2023-04-05 DIAGNOSIS — R634 Abnormal weight loss: Secondary | ICD-10-CM | POA: Diagnosis not present

## 2023-04-05 DIAGNOSIS — I69322 Dysarthria following cerebral infarction: Secondary | ICD-10-CM | POA: Diagnosis not present

## 2023-04-05 DIAGNOSIS — D692 Other nonthrombocytopenic purpura: Secondary | ICD-10-CM

## 2023-04-06 ENCOUNTER — Encounter: Payer: Self-pay | Admitting: Adult Health

## 2023-04-06 DIAGNOSIS — R278 Other lack of coordination: Secondary | ICD-10-CM | POA: Diagnosis not present

## 2023-04-06 DIAGNOSIS — I69354 Hemiplegia and hemiparesis following cerebral infarction affecting left non-dominant side: Secondary | ICD-10-CM | POA: Diagnosis not present

## 2023-04-06 DIAGNOSIS — I6621 Occlusion and stenosis of right posterior cerebral artery: Secondary | ICD-10-CM | POA: Diagnosis not present

## 2023-04-06 DIAGNOSIS — M6389 Disorders of muscle in diseases classified elsewhere, multiple sites: Secondary | ICD-10-CM | POA: Diagnosis not present

## 2023-04-06 DIAGNOSIS — I6521 Occlusion and stenosis of right carotid artery: Secondary | ICD-10-CM | POA: Diagnosis not present

## 2023-04-06 MED ORDER — CHLORHEXIDINE GLUCONATE 0.12 % MT SOLN: 15.0000 mL | Freq: Two times a day (BID) | OROMUCOSAL | 0 refills | Status: DC

## 2023-04-06 NOTE — Progress Notes (Signed)
Location:  Medical illustrator of Service:  SNF (31) Provider:   Peggye Ley, ANP [facility] [phone]   Mahlon Gammon, MD   Extended Emergency Contact Information Primary Emergency Contact: Lynnell Grain of [place] Home Phone: [phone] Mobile Phone: [phone] Relation: Daughter Secondary Emergency Contact: [name],[name]  Darden Amber of [place] Home Phone: [phone] Mobile Phone: [phone] Relation: Son  Code Status:  DNR Goals of care: Advanced Directive information    03/06/2023   10:15 AM  Advanced Directives  Does Patient Have a Medical Advance Directive? Yes  Type of Estate agent of [place];Living will;Out of facility DNR (pink MOST or yellow form)  Does patient want to make changes to medical advance directive? No - Patient declined  Copy of Healthcare Power of Attorney in Chart? Yes - validated most recent copy scanned in chart (See row information)     Chief Complaint  Patient presents with   Acute Visit    Dental concern and hematoma     HPI:  Pt is a 89 y.o. female seen today for a dental concern and a leg hematoma.   [id number] 10/02/22 to skilled care diagnosed with a right large PCA infarct s/p TNK. She continues with symptoms of left sided neglect, weakness, dysphagia.  Has a hx of prior CVA as well along with afib, pacemaker, HTN, bakers cysts with bleeding, allergic rhinitis, back pain, CAD, and OP  Her daughter reports she has bruising to her legs and one small hematoma to the left shin. Unclear etiology.  Also Ms. Hoof had a dental abscess treated with augmentin and then saw the dentist and tooth #18 removed. Her daughter is concerned about a small red spot on the gum line and residual food in her mouth. No swelling, pain, or fever noted.   Dysphagia: Seen by ST and diet upgraded to D 3 diet with HTL. She just started this diet and staff is monitoring for  tolerance.   Has chronic drippy nose, currently on atrovent spray.   Afib: rate is controlled, not on DOAC due to prior bleeding risk with bakers cysts, nose bleeds etc  BP controlled with occasional high outliers.   Has pacemaker.  Not going out for cardiology apts.   Iron def anemia on MVI with iron. Did not tolerate prior iron supplement. Hgb 9.2 02/02/23  Past Medical History:  Diagnosis Date   Actinic keratosis 05/25/2014   Anxiety    Atrial fibrillation (HCC) 09/21/2010   Basal cell carcinoma 05/25/2014   Multiple removed by Dr. Arminda Resides in March 2015: right neck, left neck, scalp    Bell's palsy 07/18/1979   Cervicalgia 01/31/2012   Closed fracture of lumbar vertebra without mention of spinal cord injury 07/17/2005   Conjunctiva disorder 12/26/2010   Coronary atherosclerosis of native coronary artery 07/17/1997   Cramp of limb 08/16/2009   Degeneration of lumbar or lumbosacral intervertebral disc 01/31/2012   Disturbance of skin sensation 08/2009   Dizziness and giddiness 11/08/2011   vertigo   External hemorrhoids without mention of complication 06/27/2010   External hemorrhoids without mention of complication 08/26/2012   Ganglion of tendon sheath 08/16/2009   Leg cramp 10/27/2013   Most frequently the right leg.    Long term (current) use of anticoagulants 09/2010   Lumbago 07/2009   Meralgia paresthetica 07/2007   MI, old    Myalgia and myositis, unspecified 01/17/2012   Osteoporosis    Other abnormal blood chemistry 04/08/2012  Other abnormal blood chemistry 2013   hyperglycemia   Other disorder of muscle, ligament, and fascia 04/08/2012   Other specified cardiac dysrhythmias(427.89) 06/27/2010   Pacemaker 05/06/2020   Pain in joint, ankle and foot 10/27/2013   Bilateral since 1998    Pain in joint, shoulder region 08/12/2012   Pain in joint, upper arm 12/26/2010   Pain in limb 01/11/2011   Pain in thoracic spine 01/31/2012   Pathologic fracture of  vertebrae 01/22/2012   PVC's (premature ventricular contractions)    Rash and other nonspecific skin eruption 08/02/2011   Senile osteoporosis 07/17/1993   Stroke (HCC)    Unspecified essential hypertension 07/17/1997   Varicose veins of lower extremities 08/16/2009   Varicose veins of lower extremities 08/16/2009   Past Surgical History:  Procedure Laterality Date   COLONOSCOPY WITH PROPOFOL N/A 10/19/2015   Procedure: COLONOSCOPY WITH PROPOFOL;  Surgeon: Iva Boop, MD;  Location: [facility];  Service: Endoscopy;  Laterality: N/A;   CORONARY ARTERY BYPASS GRAFT  1998   x2; LIMA to LAD; SVG to diagonal off bypass   HEMORRHOID SURGERY  08/26/2012   Dr. Luisa Hart   LOOP RECORDER INSERTION N/A 05/13/2018   Procedure: LOOP RECORDER INSERTION;  Surgeon: Marinus Maw, MD;  Location: [facility] INVASIVE CV LAB;  Service: Cardiovascular;  Laterality: N/A;   LOOP RECORDER REMOVAL N/A 05/26/2019   Procedure: LOOP RECORDER REMOVAL;  Surgeon: Marinus Maw, MD;  Location: [facility] INVASIVE CV LAB;  Service: Cardiovascular;  Laterality: N/A;   MASS EXCISION Left 11/23/2015   Procedure: LEFT WRIST EXCISION CYST;  Surgeon: Betha Loa, MD;  Location: [facility];  Service: Orthopedics;  Laterality: Left;   PACEMAKER IMPLANT N/A 05/26/2019   Procedure: PACEMAKER IMPLANT;  Surgeon: Marinus Maw, MD;  Location: [facility] INVASIVE CV LAB;  Service: Cardiovascular;  Laterality: N/A;   SKIN BIOPSY  01/29/14   (R) neck; (R) scalp, 2 (L) neck; shave biopsy Superficial basal cell carcinoma Dr. Arminda Resides   TONSILLECTOMY  [phone]    Allergies  Allergen Reactions   Iodinated Contrast Media Nausea Only and Other (See Comments)    Severe nausea and also passed out   Iodine Nausea Only and Other (See Comments)    "Allergic," per [facility]- PASSED OUT   Lodine [Etodolac] Other (See Comments)    "Allergic," per [facility]   Other Other (See Comments)    Seasonal Allergies   Bactrim Rash   Relafen [Nabumetone] Rash    Sulfamethoxazole-Trimethoprim Rash    In matrix   Sulfanilamide Rash    Outpatient Encounter Medications as of 04/05/2023  Medication Sig   acetaminophen (TYLENOL) 325 MG tablet Take 650 mg by mouth 3 (three) times daily as needed (for pain).   amLODipine (NORVASC) 5 MG tablet Take 5 mg by mouth daily.   Artificial Saliva (BIOTENE DRY MOUTH MOISTURIZING) SOLN Use as directed 1-2 sprays in the mouth or throat 3 (three) times daily.   aspirin 81 MG chewable tablet Chew 1 tablet (81 mg total) by mouth daily.   atenolol (TENORMIN) 50 MG tablet Take 1 tablet (50 mg total) by mouth 2 (two) times daily.   Calcium Carb-Cholecalciferol (CALCIUM 500 + D3 PO) Take 1 capsule by mouth in the morning.   diclofenac Sodium (VOLTAREN) 1 % GEL Apply 2 g topically as needed (BID PRN).   digoxin (LANOXIN) 0.125 MG tablet Take 1 tablet (0.125 mg total) by mouth every other day.   docusate (COLACE) 50 MG/5ML liquid Take 100  mg by mouth daily.   hydrALAZINE (APRESOLINE) 10 MG tablet Take 10 mg by mouth 3 (three) times daily as needed (SBP>180).   levocetirizine (XYZAL) 5 MG tablet Take 5 mg by mouth at bedtime.   losartan (COZAAR) 25 MG tablet Take 25 mg by mouth daily.   Multiple Vitamins-Minerals (MULTIVITAMIN WITH IRON-MINERALS) liquid Take 15 mLs by mouth daily.   polyethylene glycol (MIRALAX / GLYCOLAX) 17 g packet Take 17 g by mouth See admin instructions. Mix 17 grams of powder into 4-6 oz of beverage of choice and drink once a day   sodium chloride (OCEAN) 0.65 % nasal spray Place 1 spray into the nose 2 (two) times daily as needed for congestion.   sodium chloride (OCEAN) 0.65 % SOLN nasal spray Place 1 spray into both nostrils 2 (two) times daily with breakfast and lunch.   No facility-administered encounter medications on file as of 04/05/2023.    Review of Systems  Constitutional:  Negative for activity change, appetite change, chills, diaphoresis, fatigue and fever.  HENT:  Positive for rhinorrhea  and trouble swallowing. Negative for congestion.   Respiratory:  Negative for cough, shortness of breath and wheezing.        Throat clearing, wet vocal quality   Cardiovascular:  Negative for chest pain, palpitations and leg swelling.  Gastrointestinal:  Negative for abdominal distention, abdominal pain, constipation and diarrhea.  Genitourinary:  Negative for difficulty urinating and dysuria.  Musculoskeletal:  Positive for gait problem. Negative for arthralgias, back pain, joint swelling and myalgias.       Pain to left side of body  Skin:  Positive for wound.  Neurological:  Positive for speech difficulty and weakness. Negative for dizziness, tremors, seizures, syncope, facial asymmetry, light-headedness, numbness and headaches.  Psychiatric/Behavioral:  Negative for agitation, behavioral problems, confusion, dysphoric mood and sleep disturbance.     Immunization History  Administered Date(s) Administered   Influenza Whole 08/06/2012   Influenza, High Dose Seasonal PF 08/15/2019, 08/27/2020   Influenza,inj,Quad PF,6+ Mos 08/30/2018   Influenza-Unspecified 08/06/2013, 08/24/2014, 08/26/2015, 08/31/2016, 08/27/2017, 08/27/2020   Moderna Sars-Covid-2 Vaccination 11/16/2019, 12/16/2019, 09/16/2020   Pneumococcal Conjugate-13 09/22/2015   Pneumococcal Polysaccharide-23 08/29/2006   Td 10/07/2007   Zoster Recombinat (Shingrix) 02/12/2018, 05/13/2018   Zoster, Live 03/12/2008   Pertinent  Health Maintenance Due  Topic Date Due   INFLUENZA VACCINE  06/07/2023   DEXA SCAN  Completed      10/02/2022    8:00 AM 10/03/2022   10:49 AM 10/04/2022    9:50 AM 10/06/2022   10:31 AM 12/01/2022    9:28 AM  Fall Risk  Falls in the past year?  1 1 1 1   Was there an injury with Fall?  1 1 1 1   Fall Risk Category Calculator  3 3 2 2   Fall Risk Category (Retired)  High High Moderate   (RETIRED) Patient Fall Risk Level High fall risk High fall risk High fall risk High fall risk   Patient at Risk  for Falls Due to  History of fall(s) History of fall(s) History of fall(s) History of fall(s)  Fall risk Follow up  Falls evaluation completed Falls evaluation completed Falls evaluation completed Falls evaluation completed   Functional Status Survey:    Vitals:   04/06/23 0723  Weight: 125 lb (56.7 kg)   Body mass index is 20.18 kg/m. Physical Exam Vitals and nursing note reviewed.  Constitutional:      General: She is not in acute distress.  Appearance: She is not diaphoretic.  HENT:     Head: Normocephalic and atraumatic.     Mouth/Throat:     Mouth: Mucous membranes are moist.     Pharynx: Oropharynx is clear.   Neck:     Vascular: No JVD.  Cardiovascular:     Rate and Rhythm: Normal rate. Rhythm irregular.     Heart sounds: No murmur heard. Pulmonary:     Effort: Pulmonary effort is normal. No respiratory distress.     Breath sounds: Normal breath sounds. No wheezing.  Musculoskeletal:     Cervical back: No rigidity or tenderness.  Lymphadenopathy:     Cervical: No cervical adenopathy.  Skin:    General: Skin is warm and dry.     Findings: Bruising (BUE and BLE. Also has small hematoma to left shin) present.  Neurological:     Mental Status: She is alert.     Comments: Oriented to self and place Able to f/c Has left sided hemiplegia.      Labs reviewed: Recent Labs    09/30/22 0931 10/01/22 0806 10/02/22 0900 10/21/22 0000 12/07/22 0000 12/11/22 0000 01/02/23 0000  NA 141 142 142   < > 139 144 141  K 3.6 3.3* 3.9   < > 4.0 3.9 4.0  CL 108 111 116*   < > 104 108 108  CO2 22 20* 19*   < > 21 22 23*  GLUCOSE 107* 114* 108*  --   --   --   --   BUN 34* 36* 35*   < > 82* 59* 38*  CREATININE 1.25* 1.21* 1.09*   < > 1.3* 1.1 1.1  CALCIUM 8.5* 8.2* 8.2*   < > 8.9 8.8 8.7   < > = values in this interval not displayed.    Recent Labs    09/26/22 1539 10/21/22 0000 12/07/22 0000 12/11/22 0000 01/02/23 0000  AST 17   < > 58* 60* 21  ALT 12   < >  70* 73* 21  ALKPHOS 71   < > 99 94 80  BILITOT 0.3  --   --   --   --   PROT 7.1  --   --   --   --   ALBUMIN 3.1*   < > 3.1* 2.9* 3.1*   < > = values in this interval not displayed.    Recent Labs    09/30/22 0931 10/01/22 0806 10/02/22 0900 10/09/22 0000 10/12/22 0000 10/16/22 0000 10/20/22 0000 10/31/22 0000 12/08/22 0000 01/02/23 0000  WBC 10.7* 11.0* 9.9 18.8 17.7 15.1   < > 11.4 10.4 7.0  NEUTROABS  --   --   --  11.10 11.60 9.40  --   --   --   --   HGB 9.2* 8.5* 8.8* 9.3* 9.7* 8.6*   < > 8.2* 8.3* 8.8*  HCT 28.9* 25.7* 27.2* 29* 30* 27*   < > 25* 25* 27*  MCV 88.1 87.1 86.6  --   --   --   --   --   --   --   PLT 96* 87* 109* 179 210 217   < > 103* 187 162   < > = values in this interval not displayed.    Lab Results  Component Value Date   TSH 2.87 02/03/2022   Lab Results  Component Value Date   HGBA1C 5.7 (H) 09/26/2022   Lab Results  Component Value Date   CHOL 161  09/27/2022   HDL 39 (L) 09/27/2022   LDLCALC 114 (H) 09/27/2022   LDLDIRECT 81.3 08/19/2020   TRIG 39 09/27/2022   CHOLHDL 4.1 09/27/2022    Significant Diagnostic Results in last 30 days:  No results found.  Assessment/Plan  1. Dental plaque F/u with dentist. Staff to monitor oral cavity daily for two weeks.  - chlorhexidine (PERIDEX) 0.12 % solution; Use as directed 15 mLs in the mouth or throat 2 (two) times daily.  Dispense: 120 mL; Refill: 0  2. Hematoma Unclear etiology ON aspirin. Already wears pants and boots to legs, would not likely benefit from gerisleeves. Has fragile skin, also on asa.  3. Oropharyngeal dysphagia Recently upgraded to D3 diet with HTL  4. 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  No longer on statin due to lack of benefit/possible s/e Continues on baby asa  BP ok  5. Senile purpura (HCC) Noted.   6. Weight loss Mild loss On magic cup  tid     Family/ staff Communication: Marylu Lund daughter POA  Labs/tests ordered:  NA

## 2023-04-09 DIAGNOSIS — I69322 Dysarthria following cerebral infarction: Secondary | ICD-10-CM | POA: Diagnosis not present

## 2023-04-09 DIAGNOSIS — R1312 Dysphagia, oropharyngeal phase: Secondary | ICD-10-CM | POA: Diagnosis not present

## 2023-04-09 DIAGNOSIS — I69391 Dysphagia following cerebral infarction: Secondary | ICD-10-CM | POA: Diagnosis not present

## 2023-04-10 DIAGNOSIS — I69322 Dysarthria following cerebral infarction: Secondary | ICD-10-CM | POA: Diagnosis not present

## 2023-04-10 DIAGNOSIS — R1312 Dysphagia, oropharyngeal phase: Secondary | ICD-10-CM | POA: Diagnosis not present

## 2023-04-10 DIAGNOSIS — I69391 Dysphagia following cerebral infarction: Secondary | ICD-10-CM | POA: Diagnosis not present

## 2023-04-11 DIAGNOSIS — I69354 Hemiplegia and hemiparesis following cerebral infarction affecting left non-dominant side: Secondary | ICD-10-CM | POA: Diagnosis not present

## 2023-04-11 DIAGNOSIS — I6521 Occlusion and stenosis of right carotid artery: Secondary | ICD-10-CM | POA: Diagnosis not present

## 2023-04-11 DIAGNOSIS — R278 Other lack of coordination: Secondary | ICD-10-CM | POA: Diagnosis not present

## 2023-04-11 DIAGNOSIS — I6621 Occlusion and stenosis of right posterior cerebral artery: Secondary | ICD-10-CM | POA: Diagnosis not present

## 2023-04-11 DIAGNOSIS — M6389 Disorders of muscle in diseases classified elsewhere, multiple sites: Secondary | ICD-10-CM | POA: Diagnosis not present

## 2023-04-12 DIAGNOSIS — I69391 Dysphagia following cerebral infarction: Secondary | ICD-10-CM | POA: Diagnosis not present

## 2023-04-12 DIAGNOSIS — R1312 Dysphagia, oropharyngeal phase: Secondary | ICD-10-CM | POA: Diagnosis not present

## 2023-04-12 DIAGNOSIS — I69322 Dysarthria following cerebral infarction: Secondary | ICD-10-CM | POA: Diagnosis not present

## 2023-04-13 DIAGNOSIS — M6389 Disorders of muscle in diseases classified elsewhere, multiple sites: Secondary | ICD-10-CM | POA: Diagnosis not present

## 2023-04-13 DIAGNOSIS — R278 Other lack of coordination: Secondary | ICD-10-CM | POA: Diagnosis not present

## 2023-04-13 DIAGNOSIS — I6621 Occlusion and stenosis of right posterior cerebral artery: Secondary | ICD-10-CM | POA: Diagnosis not present

## 2023-04-13 DIAGNOSIS — I6521 Occlusion and stenosis of right carotid artery: Secondary | ICD-10-CM | POA: Diagnosis not present

## 2023-04-13 DIAGNOSIS — I69354 Hemiplegia and hemiparesis following cerebral infarction affecting left non-dominant side: Secondary | ICD-10-CM | POA: Diagnosis not present

## 2023-04-16 DIAGNOSIS — R1312 Dysphagia, oropharyngeal phase: Secondary | ICD-10-CM | POA: Diagnosis not present

## 2023-04-16 DIAGNOSIS — I69322 Dysarthria following cerebral infarction: Secondary | ICD-10-CM | POA: Diagnosis not present

## 2023-04-16 DIAGNOSIS — I69391 Dysphagia following cerebral infarction: Secondary | ICD-10-CM | POA: Diagnosis not present

## 2023-04-17 DIAGNOSIS — I69322 Dysarthria following cerebral infarction: Secondary | ICD-10-CM | POA: Diagnosis not present

## 2023-04-17 DIAGNOSIS — I6521 Occlusion and stenosis of right carotid artery: Secondary | ICD-10-CM | POA: Diagnosis not present

## 2023-04-17 DIAGNOSIS — R1312 Dysphagia, oropharyngeal phase: Secondary | ICD-10-CM | POA: Diagnosis not present

## 2023-04-17 DIAGNOSIS — I69391 Dysphagia following cerebral infarction: Secondary | ICD-10-CM | POA: Diagnosis not present

## 2023-04-17 DIAGNOSIS — M6389 Disorders of muscle in diseases classified elsewhere, multiple sites: Secondary | ICD-10-CM | POA: Diagnosis not present

## 2023-04-17 DIAGNOSIS — I69354 Hemiplegia and hemiparesis following cerebral infarction affecting left non-dominant side: Secondary | ICD-10-CM | POA: Diagnosis not present

## 2023-04-17 DIAGNOSIS — I6621 Occlusion and stenosis of right posterior cerebral artery: Secondary | ICD-10-CM | POA: Diagnosis not present

## 2023-04-17 DIAGNOSIS — R278 Other lack of coordination: Secondary | ICD-10-CM | POA: Diagnosis not present

## 2023-04-18 DIAGNOSIS — I6521 Occlusion and stenosis of right carotid artery: Secondary | ICD-10-CM | POA: Diagnosis not present

## 2023-04-18 DIAGNOSIS — I6621 Occlusion and stenosis of right posterior cerebral artery: Secondary | ICD-10-CM | POA: Diagnosis not present

## 2023-04-18 DIAGNOSIS — I69354 Hemiplegia and hemiparesis following cerebral infarction affecting left non-dominant side: Secondary | ICD-10-CM | POA: Diagnosis not present

## 2023-04-18 DIAGNOSIS — R278 Other lack of coordination: Secondary | ICD-10-CM | POA: Diagnosis not present

## 2023-04-18 DIAGNOSIS — M6389 Disorders of muscle in diseases classified elsewhere, multiple sites: Secondary | ICD-10-CM | POA: Diagnosis not present

## 2023-04-18 LAB — BASIC METABOLIC PANEL
BUN: 43 — AB (ref 4–21)
CO2: 23 — AB (ref 13–22)
Chloride: 107 (ref 99–108)
Creatinine: 1.1 (ref 0.5–1.1)
Glucose: 102
Potassium: 4.3 mEq/L (ref 3.5–5.1)
Sodium: 141 (ref 137–147)

## 2023-04-19 DIAGNOSIS — I69322 Dysarthria following cerebral infarction: Secondary | ICD-10-CM | POA: Diagnosis not present

## 2023-04-19 DIAGNOSIS — I69391 Dysphagia following cerebral infarction: Secondary | ICD-10-CM | POA: Diagnosis not present

## 2023-04-19 DIAGNOSIS — R1312 Dysphagia, oropharyngeal phase: Secondary | ICD-10-CM | POA: Diagnosis not present

## 2023-04-23 DIAGNOSIS — R1312 Dysphagia, oropharyngeal phase: Secondary | ICD-10-CM | POA: Diagnosis not present

## 2023-04-23 DIAGNOSIS — I69322 Dysarthria following cerebral infarction: Secondary | ICD-10-CM | POA: Diagnosis not present

## 2023-04-23 DIAGNOSIS — I69391 Dysphagia following cerebral infarction: Secondary | ICD-10-CM | POA: Diagnosis not present

## 2023-04-24 DIAGNOSIS — I69322 Dysarthria following cerebral infarction: Secondary | ICD-10-CM | POA: Diagnosis not present

## 2023-04-24 DIAGNOSIS — M6389 Disorders of muscle in diseases classified elsewhere, multiple sites: Secondary | ICD-10-CM | POA: Diagnosis not present

## 2023-04-24 DIAGNOSIS — R1312 Dysphagia, oropharyngeal phase: Secondary | ICD-10-CM | POA: Diagnosis not present

## 2023-04-24 DIAGNOSIS — I6621 Occlusion and stenosis of right posterior cerebral artery: Secondary | ICD-10-CM | POA: Diagnosis not present

## 2023-04-24 DIAGNOSIS — R278 Other lack of coordination: Secondary | ICD-10-CM | POA: Diagnosis not present

## 2023-04-24 DIAGNOSIS — I69354 Hemiplegia and hemiparesis following cerebral infarction affecting left non-dominant side: Secondary | ICD-10-CM | POA: Diagnosis not present

## 2023-04-24 DIAGNOSIS — I6521 Occlusion and stenosis of right carotid artery: Secondary | ICD-10-CM | POA: Diagnosis not present

## 2023-04-24 DIAGNOSIS — I69391 Dysphagia following cerebral infarction: Secondary | ICD-10-CM | POA: Diagnosis not present

## 2023-04-25 DIAGNOSIS — I6621 Occlusion and stenosis of right posterior cerebral artery: Secondary | ICD-10-CM | POA: Diagnosis not present

## 2023-04-25 DIAGNOSIS — R278 Other lack of coordination: Secondary | ICD-10-CM | POA: Diagnosis not present

## 2023-04-25 DIAGNOSIS — I69354 Hemiplegia and hemiparesis following cerebral infarction affecting left non-dominant side: Secondary | ICD-10-CM | POA: Diagnosis not present

## 2023-04-25 DIAGNOSIS — I6521 Occlusion and stenosis of right carotid artery: Secondary | ICD-10-CM | POA: Diagnosis not present

## 2023-04-25 DIAGNOSIS — M6389 Disorders of muscle in diseases classified elsewhere, multiple sites: Secondary | ICD-10-CM | POA: Diagnosis not present

## 2023-04-26 DIAGNOSIS — R1312 Dysphagia, oropharyngeal phase: Secondary | ICD-10-CM | POA: Diagnosis not present

## 2023-04-26 DIAGNOSIS — I69322 Dysarthria following cerebral infarction: Secondary | ICD-10-CM | POA: Diagnosis not present

## 2023-04-26 DIAGNOSIS — I69391 Dysphagia following cerebral infarction: Secondary | ICD-10-CM | POA: Diagnosis not present

## 2023-04-30 DIAGNOSIS — R1312 Dysphagia, oropharyngeal phase: Secondary | ICD-10-CM | POA: Diagnosis not present

## 2023-04-30 DIAGNOSIS — I69322 Dysarthria following cerebral infarction: Secondary | ICD-10-CM | POA: Diagnosis not present

## 2023-04-30 DIAGNOSIS — I69391 Dysphagia following cerebral infarction: Secondary | ICD-10-CM | POA: Diagnosis not present

## 2023-05-01 DIAGNOSIS — I6521 Occlusion and stenosis of right carotid artery: Secondary | ICD-10-CM | POA: Diagnosis not present

## 2023-05-01 DIAGNOSIS — I6621 Occlusion and stenosis of right posterior cerebral artery: Secondary | ICD-10-CM | POA: Diagnosis not present

## 2023-05-01 DIAGNOSIS — I69322 Dysarthria following cerebral infarction: Secondary | ICD-10-CM | POA: Diagnosis not present

## 2023-05-01 DIAGNOSIS — I69391 Dysphagia following cerebral infarction: Secondary | ICD-10-CM | POA: Diagnosis not present

## 2023-05-01 DIAGNOSIS — R278 Other lack of coordination: Secondary | ICD-10-CM | POA: Diagnosis not present

## 2023-05-01 DIAGNOSIS — I69354 Hemiplegia and hemiparesis following cerebral infarction affecting left non-dominant side: Secondary | ICD-10-CM | POA: Diagnosis not present

## 2023-05-01 DIAGNOSIS — M6389 Disorders of muscle in diseases classified elsewhere, multiple sites: Secondary | ICD-10-CM | POA: Diagnosis not present

## 2023-05-01 DIAGNOSIS — Z79899 Other long term (current) drug therapy: Secondary | ICD-10-CM | POA: Diagnosis not present

## 2023-05-01 DIAGNOSIS — R1312 Dysphagia, oropharyngeal phase: Secondary | ICD-10-CM | POA: Diagnosis not present

## 2023-05-03 DIAGNOSIS — R278 Other lack of coordination: Secondary | ICD-10-CM | POA: Diagnosis not present

## 2023-05-03 DIAGNOSIS — I69322 Dysarthria following cerebral infarction: Secondary | ICD-10-CM | POA: Diagnosis not present

## 2023-05-03 DIAGNOSIS — I69391 Dysphagia following cerebral infarction: Secondary | ICD-10-CM | POA: Diagnosis not present

## 2023-05-03 DIAGNOSIS — I6621 Occlusion and stenosis of right posterior cerebral artery: Secondary | ICD-10-CM | POA: Diagnosis not present

## 2023-05-03 DIAGNOSIS — I69354 Hemiplegia and hemiparesis following cerebral infarction affecting left non-dominant side: Secondary | ICD-10-CM | POA: Diagnosis not present

## 2023-05-03 DIAGNOSIS — I6521 Occlusion and stenosis of right carotid artery: Secondary | ICD-10-CM | POA: Diagnosis not present

## 2023-05-03 DIAGNOSIS — M6389 Disorders of muscle in diseases classified elsewhere, multiple sites: Secondary | ICD-10-CM | POA: Diagnosis not present

## 2023-05-03 DIAGNOSIS — R1312 Dysphagia, oropharyngeal phase: Secondary | ICD-10-CM | POA: Diagnosis not present

## 2023-05-07 ENCOUNTER — Encounter: Payer: Self-pay | Admitting: Internal Medicine

## 2023-05-07 ENCOUNTER — Non-Acute Institutional Stay (SKILLED_NURSING_FACILITY): Payer: Medicare Other | Admitting: Internal Medicine

## 2023-05-07 DIAGNOSIS — R1312 Dysphagia, oropharyngeal phase: Secondary | ICD-10-CM | POA: Diagnosis not present

## 2023-05-07 DIAGNOSIS — I69391 Dysphagia following cerebral infarction: Secondary | ICD-10-CM | POA: Diagnosis not present

## 2023-05-07 DIAGNOSIS — I4821 Permanent atrial fibrillation: Secondary | ICD-10-CM

## 2023-05-07 DIAGNOSIS — I69354 Hemiplegia and hemiparesis following cerebral infarction affecting left non-dominant side: Secondary | ICD-10-CM

## 2023-05-07 DIAGNOSIS — D509 Iron deficiency anemia, unspecified: Secondary | ICD-10-CM

## 2023-05-07 DIAGNOSIS — R4182 Altered mental status, unspecified: Secondary | ICD-10-CM | POA: Diagnosis not present

## 2023-05-07 DIAGNOSIS — R400 Somnolence: Secondary | ICD-10-CM

## 2023-05-07 DIAGNOSIS — I272 Pulmonary hypertension, unspecified: Secondary | ICD-10-CM | POA: Diagnosis not present

## 2023-05-07 DIAGNOSIS — I69322 Dysarthria following cerebral infarction: Secondary | ICD-10-CM | POA: Diagnosis not present

## 2023-05-07 DIAGNOSIS — Z950 Presence of cardiac pacemaker: Secondary | ICD-10-CM | POA: Diagnosis not present

## 2023-05-07 DIAGNOSIS — I501 Left ventricular failure: Secondary | ICD-10-CM | POA: Diagnosis not present

## 2023-05-07 DIAGNOSIS — I517 Cardiomegaly: Secondary | ICD-10-CM | POA: Diagnosis not present

## 2023-05-07 LAB — HEPATIC FUNCTION PANEL
ALT: 16 U/L (ref 7–35)
AST: 24 (ref 13–35)
Alkaline Phosphatase: 57 (ref 25–125)
Bilirubin, Total: 0.4

## 2023-05-07 LAB — BASIC METABOLIC PANEL
BUN: 70 — AB (ref 4–21)
CO2: 26 — AB (ref 13–22)
Chloride: 118 — AB (ref 99–108)
Creatinine: 2 — AB (ref 0.5–1.1)
Glucose: 144
Potassium: 4.4 mEq/L (ref 3.5–5.1)
Sodium: 153 — AB (ref 137–147)

## 2023-05-07 LAB — COMPREHENSIVE METABOLIC PANEL
Albumin: 3.3 — AB (ref 3.5–5.0)
Calcium: 8.7 (ref 8.7–10.7)
eGFR: 23

## 2023-05-07 LAB — CBC AND DIFFERENTIAL
HCT: 32 — AB (ref 36–46)
Hemoglobin: 10.6 — AB (ref 12.0–16.0)
Neutrophils Absolute: 7.9
Platelets: 148 10*3/uL — AB (ref 150–400)
WBC: 12.4

## 2023-05-07 LAB — CBC: RBC: 3.78 — AB (ref 3.87–5.11)

## 2023-05-07 NOTE — Progress Notes (Unsigned)
Location:   [facility] Room Number: 118A Place of Service:  SNF [phone]) Provider:  Einar Crow, MD  Mahlon Gammon, MD  Patient Care Team: Mahlon Gammon, MD as PCP - General (Internal Medicine) Marinus Maw, MD as PCP - Electrophysiology (Cardiology) Mathews Robinsons, MD as Consulting Physician (Dermatology) Marinus Maw, MD as Consulting Physician (Cardiology) [facility] Jeanella Craze, MD as Consulting Physician (Dermatology)  Extended Emergency Contact Information Primary Emergency Contact: Lynnell Grain of [place] Home Phone: [phone] Mobile Phone: [phone] Relation: Daughter Secondary Emergency Contact: Claud Kelp of [place] Home Phone: [phone] Mobile Phone: [phone] Relation: Son  Code Status:  DNR Goals of care: Advanced Directive information    05/07/2023   11:01 AM  Advanced Directives  Does Patient Have a Medical Advance Directive? Yes  Type of Estate agent of [place];Living will;Out of facility DNR (pink MOST or yellow form)  Does patient want to make changes to medical advance directive? No - Patient declined  Copy of Healthcare Power of Attorney in Chart? Yes - validated most recent copy scanned in chart (See row information)  Pre-existing out of facility DNR order (yellow form or pink MOST form) Pink MOST form placed in chart (order not valid for inpatient use);Yellow form placed in chart (order not valid for inpatient use)     Chief Complaint  Patient presents with   Medical Management of Chronic Issues    Routine follow up   Immunizations    Discuss need for Tdap and COVID booster   Quality Metric Gaps    Medicare annual wellness due   Acute Visit    HPI:  Pt is a 89 y.o. female seen today for medical management of chronic diseases.   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 Arminda Resides in March 2015: right neck, left neck, scalp    Bell's palsy 07/18/1979   Cervicalgia 01/31/2012   Closed fracture of lumbar vertebra without mention of spinal cord injury 07/17/2005   Conjunctiva disorder 12/26/2010   Coronary atherosclerosis of native coronary artery 07/17/1997   Cramp of limb 08/16/2009   Degeneration of lumbar or lumbosacral intervertebral disc 01/31/2012   Disturbance of skin sensation 08/2009   Dizziness and giddiness 11/08/2011   vertigo   External hemorrhoids without mention of complication 06/27/2010   External hemorrhoids without mention of complication 08/26/2012  Ganglion of tendon sheath 08/16/2009   Leg cramp 10/27/2013   Most frequently the right leg.    Long term (current) use of anticoagulants 09/2010   Lumbago 07/2009   Meralgia paresthetica 07/2007   MI, old    Myalgia and myositis, unspecified 01/17/2012   Osteoporosis    Other abnormal blood chemistry 04/08/2012    Other abnormal blood chemistry 2013   hyperglycemia   Other disorder of muscle, ligament, and fascia 04/08/2012   Other specified cardiac dysrhythmias(427.89) 06/27/2010   Pacemaker 05/06/2020   Pain in joint, ankle and foot 10/27/2013   Bilateral since 1998    Pain in joint, shoulder region 08/12/2012   Pain in joint, upper arm 12/26/2010   Pain in limb 01/11/2011   Pain in thoracic spine 01/31/2012   Pathologic fracture of vertebrae 01/22/2012   PVC's (premature ventricular contractions)    Rash and other nonspecific skin eruption 08/02/2011   Senile osteoporosis 07/17/1993   Stroke (HCC)    Unspecified essential hypertension 07/17/1997   Varicose veins of lower extremities 08/16/2009   Varicose veins of lower extremities 08/16/2009   Past Surgical History:  Procedure Laterality Date   COLONOSCOPY WITH PROPOFOL N/A 10/19/2015   Procedure: COLONOSCOPY WITH PROPOFOL;  Surgeon: Iva Boop, MD;  Location: [facility];  Service: Endoscopy;  Laterality: N/A;   CORONARY ARTERY BYPASS GRAFT  1998   x2; LIMA to LAD; SVG to diagonal off bypass   HEMORRHOID SURGERY  08/26/2012   Dr. Luisa Hart   LOOP RECORDER INSERTION N/A 05/13/2018   Procedure: LOOP RECORDER INSERTION;  Surgeon: Marinus Maw, MD;  Location: [facility] INVASIVE CV LAB;  Service: Cardiovascular;  Laterality: N/A;   LOOP RECORDER REMOVAL N/A 05/26/2019   Procedure: LOOP RECORDER REMOVAL;  Surgeon: Marinus Maw, MD;  Location: [facility] INVASIVE CV LAB;  Service: Cardiovascular;  Laterality: N/A;   MASS EXCISION Left 11/23/2015   Procedure: LEFT WRIST EXCISION CYST;  Surgeon: Betha Loa, MD;  Location: [facility];  Service: Orthopedics;  Laterality: Left;   PACEMAKER IMPLANT N/A 05/26/2019   Procedure: PACEMAKER IMPLANT;  Surgeon: Marinus Maw, MD;  Location: [facility] INVASIVE CV LAB;  Service: Cardiovascular;  Laterality: N/A;   SKIN BIOPSY  01/29/14   (R) neck; (R) scalp, 2 (L) neck; shave biopsy Superficial basal cell  carcinoma Dr. Arminda Resides   TONSILLECTOMY  [phone]    Allergies  Allergen Reactions   Iodinated Contrast Media Nausea Only and Other (See Comments)    Severe nausea and also passed out   Iodine Nausea Only and Other (See Comments)    "Allergic," per [facility]- PASSED OUT   Lodine [Etodolac] Other (See Comments)    "Allergic," per [facility]   Other Other (See Comments)    Seasonal Allergies   Bactrim Rash   Relafen [Nabumetone] Rash   Sulfamethoxazole-Trimethoprim Rash    In matrix   Sulfanilamide Rash    Allergies as of 05/07/2023       Reactions   Iodinated Contrast Media Nausea Only, Other (See Comments)   Severe nausea and also passed out   Iodine Nausea Only, Other (See Comments)   "Allergic," per [facility]- PASSED OUT   Lodine [etodolac] Other (See Comments)   "Allergic," per [facility]   Other Other (See Comments)   Seasonal Allergies   Bactrim Rash   Relafen [nabumetone] Rash   Sulfamethoxazole-trimethoprim Rash   In matrix   Sulfanilamide Rash        Medication List  Accurate as of May 07, 2023 11:25 AM. If you have any questions, ask your nurse or doctor.          acetaminophen 325 MG tablet Commonly known as: TYLENOL Take 650 mg by mouth 3 (three) times daily as needed (for pain).   amLODipine 5 MG tablet Commonly known as: NORVASC Take 5 mg by mouth daily.   Aspirin Low Dose 81 MG chewable tablet Generic drug: aspirin Chew 1 tablet (81 mg total) by mouth daily.   atenolol 50 MG tablet Commonly known as: TENORMIN Take 1 tablet (50 mg total) by mouth 2 (two) times daily.   Biotene Dry Mouth Moisturizing Soln Use as directed 1-2 sprays in the mouth or throat 3 (three) times daily.   CALCIUM 500 + D3 PO Take 1 capsule by mouth in the morning.   chlorhexidine 0.12 % solution Commonly known as: Peridex Use as directed 15 mLs in the mouth or throat 2 (two) times daily.   diclofenac Sodium 1 % Gel Commonly known as: VOLTAREN Apply 2 g topically as needed  (BID PRN).   digoxin 0.125 MG tablet Commonly known as: LANOXIN Take 1 tablet (0.125 mg total) by mouth every other day.   docusate 50 MG/5ML liquid Commonly known as: COLACE Take 100 mg by mouth daily.   hydrALAZINE 10 MG tablet Commonly known as: APRESOLINE Take 10 mg by mouth 3 (three) times daily as needed (SBP>180).   ipratropium 0.03 % nasal spray Commonly known as: ATROVENT Place 2 sprays into both nostrils daily.   levocetirizine 5 MG tablet Commonly known as: XYZAL Take 5 mg by mouth at bedtime.   losartan 25 MG tablet Commonly known as: COZAAR Take 25 mg by mouth daily.   multivitamin with iron-minerals liquid Take 15 mLs by mouth daily.   polyethylene glycol 17 g packet Commonly known as: MIRALAX / GLYCOLAX Take 17 g by mouth See admin instructions. Mix 17 grams of powder into 4-6 oz of beverage of choice and drink once a day   promethazine 12.5 MG suppository Commonly known as: PHENERGAN Place 12.5 mg rectally every 6 (six) hours as needed for nausea or vomiting.   sodium chloride 0.65 % nasal spray Commonly known as: OCEAN Place 1 spray into the nose 2 (two) times daily as needed for congestion.        Review of Systems  Unable to perform ROS: Other    Immunization History  Administered Date(s) Administered   Influenza Whole 08/06/2012   Influenza, High Dose Seasonal PF 08/15/2019, 08/27/2020   Influenza,inj,Quad PF,6+ Mos 08/30/2018   Influenza-Unspecified 08/06/2013, 08/24/2014, 08/26/2015, 08/31/2016, 08/27/2017, 08/27/2020   Moderna Sars-Covid-2 Vaccination 11/16/2019, 12/16/2019, 09/16/2020   Pneumococcal Conjugate-13 09/22/2015   Pneumococcal Polysaccharide-23 08/29/2006   Td 10/07/2007   Zoster Recombinant(Shingrix) 02/12/2018, 05/13/2018   Zoster, Live 03/12/2008   Pertinent  Health Maintenance Due  Topic Date Due   INFLUENZA VACCINE  06/07/2023   DEXA SCAN  Completed      10/02/2022    8:00 AM 10/03/2022   10:49 AM 10/04/2022     9:50 AM 10/06/2022   10:31 AM 12/01/2022    9:28 AM  Fall Risk  Falls in the past year?  1 1 1 1   Was there an injury with Fall?  1 1 1 1   Fall Risk Category Calculator  3 3 2 2   Fall Risk Category (Retired)  High High Moderate   (RETIRED) Patient Fall Risk Level High fall risk High fall risk High  fall risk High fall risk   Patient at Risk for Falls Due to  History of fall(s) History of fall(s) History of fall(s) History of fall(s)  Fall risk Follow up  Falls evaluation completed Falls evaluation completed Falls evaluation completed Falls evaluation completed   Functional Status Survey:    Vitals:   05/07/23 1058  BP: (!) 140/70  Pulse: 91  Resp: 18  Temp: (!) 97.2 F (36.2 C)  SpO2: 96%  Weight: 126 lb 3.2 oz (57.2 kg)  Height: 5\' 6"  (1.676 m)   Body mass index is 20.37 kg/m. Physical Exam Vitals reviewed.  Constitutional:      Appearance: Normal appearance.     Comments: Sleepy  HENT:     Head: Normocephalic.     Nose: Nose normal.     Mouth/Throat:     Mouth: Mucous membranes are moist.     Pharynx: Oropharynx is clear.  Eyes:     Pupils: Pupils are equal, round, and reactive to light.  Cardiovascular:     Rate and Rhythm: Normal rate. Rhythm irregular.     Pulses: Normal pulses.     Heart sounds: Normal heart sounds.  Pulmonary:     Effort: Pulmonary effort is normal.     Breath sounds: Normal breath sounds. No wheezing or rales.  Abdominal:     General: Abdomen is flat. Bowel sounds are normal.     Palpations: Abdomen is soft.  Musculoskeletal:        General: No swelling.     Cervical back: Neck supple.  Skin:    General: Skin is warm.  Neurological:     Comments: More sleepy and Not following Commands  Psychiatric:        Mood and Affect: Mood normal.        Thought Content: Thought content normal.     Labs reviewed: Recent Labs    09/30/22 0931 10/01/22 0806 10/02/22 0900 10/21/22 0000 12/18/22 0000 01/02/23 0000 02/02/23 0000  NA  141 142 142   < > 145 141 142  K 3.6 3.3* 3.9   < > 4.2 4.0 3.9  CL 108 111 116*   < > 108 108 104  CO2 22 20* 19*   < > 25* 23* 22  GLUCOSE 107* 114* 108*  --   --   --   --   BUN 34* 36* 35*   < > 46* 38* 37*  CREATININE 1.25* 1.21* 1.09*   < > 1.0 1.1 1.1  CALCIUM 8.5* 8.2* 8.2*   < > 8.8 8.7 9.2   < > = values in this interval not displayed.   Recent Labs    09/26/22 1539 10/21/22 0000 12/18/22 0000 01/02/23 0000 02/02/23 0000  AST 17   < > 38* 21 21  ALT 12   < > 49* 21 17  ALKPHOS 71   < > 102 80 83  BILITOT 0.3  --   --   --   --   PROT 7.1  --   --   --   --   ALBUMIN 3.1*   < > 3.0* 3.1* 3.4*   < > = values in this interval not displayed.   Recent Labs    09/30/22 0931 10/01/22 0806 10/02/22 0900 10/09/22 0000 12/18/22 0000 01/02/23 0000 02/02/23 0000 02/21/23 0000  WBC 10.7* 11.0* 9.9   < > 9.3 7.0 9.1 9.3  NEUTROABS  --   --   --    < >  5.40  --  5.70 5.00  HGB 9.2* 8.5* 8.8*   < > 8.5* 8.8* 9.2* 10.5*  HCT 28.9* 25.7* 27.2*   < > 26* 27* 30* 32*  MCV 88.1 87.1 86.6  --   --   --   --   --   PLT 96* 87* 109*   < > 130* 162 182 163   < > = values in this interval not displayed.   Lab Results  Component Value Date   TSH 2.87 02/03/2022   Lab Results  Component Value Date   HGBA1C 5.7 (H) 09/26/2022   Lab Results  Component Value Date   CHOL 161 09/27/2022   HDL 39 (L) 09/27/2022   LDLCALC 114 (H) 09/27/2022   LDLDIRECT 81.3 08/19/2020   TRIG 39 09/27/2022   CHOLHDL 4.1 09/27/2022    Significant Diagnostic Results in last 30 days:  No results found.  Assessment/Plan 1. Somnolence Will check CBC,CMP, Chest Xray  Has not had Nausea or vomiting today but is not eating well eaither 2. Oropharyngeal dysphagia I son Nectar thick Liquids  Working with Speech. Not sure is she will be able to tolerate  3. 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 Permanent atrial fibrillation (HCC) Metoprolol and Digoxin Not on DOCA  5. Iron deficiency anemia, unspecified iron deficiency anemia type  6 Hypertension On Norvasc   Family/ staff Communication:   Labs/tests ordered:

## 2023-05-08 ENCOUNTER — Encounter: Payer: Self-pay | Admitting: Internal Medicine

## 2023-05-08 ENCOUNTER — Non-Acute Institutional Stay (SKILLED_NURSING_FACILITY): Payer: Medicare Other | Admitting: Internal Medicine

## 2023-05-08 DIAGNOSIS — I4821 Permanent atrial fibrillation: Secondary | ICD-10-CM | POA: Diagnosis not present

## 2023-05-08 DIAGNOSIS — I482 Chronic atrial fibrillation, unspecified: Secondary | ICD-10-CM | POA: Diagnosis not present

## 2023-05-08 DIAGNOSIS — I69322 Dysarthria following cerebral infarction: Secondary | ICD-10-CM | POA: Diagnosis not present

## 2023-05-08 DIAGNOSIS — I69354 Hemiplegia and hemiparesis following cerebral infarction affecting left non-dominant side: Secondary | ICD-10-CM | POA: Diagnosis not present

## 2023-05-08 DIAGNOSIS — R7989 Other specified abnormal findings of blood chemistry: Secondary | ICD-10-CM

## 2023-05-08 DIAGNOSIS — N289 Disorder of kidney and ureter, unspecified: Secondary | ICD-10-CM | POA: Diagnosis not present

## 2023-05-08 DIAGNOSIS — E870 Hyperosmolality and hypernatremia: Secondary | ICD-10-CM

## 2023-05-08 DIAGNOSIS — I10 Essential (primary) hypertension: Secondary | ICD-10-CM | POA: Diagnosis not present

## 2023-05-08 DIAGNOSIS — R1312 Dysphagia, oropharyngeal phase: Secondary | ICD-10-CM | POA: Diagnosis not present

## 2023-05-08 DIAGNOSIS — I6521 Occlusion and stenosis of right carotid artery: Secondary | ICD-10-CM | POA: Diagnosis not present

## 2023-05-08 DIAGNOSIS — M6389 Disorders of muscle in diseases classified elsewhere, multiple sites: Secondary | ICD-10-CM | POA: Diagnosis not present

## 2023-05-08 DIAGNOSIS — R278 Other lack of coordination: Secondary | ICD-10-CM | POA: Diagnosis not present

## 2023-05-08 DIAGNOSIS — I69391 Dysphagia following cerebral infarction: Secondary | ICD-10-CM | POA: Diagnosis not present

## 2023-05-08 DIAGNOSIS — I6621 Occlusion and stenosis of right posterior cerebral artery: Secondary | ICD-10-CM | POA: Diagnosis not present

## 2023-05-08 NOTE — Progress Notes (Unsigned)
Location: Medical illustrator of Service:  SNF (31)  Provider:   Code Status: DNR Goals of Care:     05/07/2023   11:01 AM  Advanced Directives  Does Patient Have a Medical Advance Directive? Yes  Type of Estate agent of [place];Living will;Out of facility DNR (pink MOST or yellow form)  Does patient want to make changes to medical advance directive? No - Patient declined  Copy of Healthcare Power of Attorney in Chart? Yes - validated most recent copy scanned in chart (See row information)  Pre-existing out of facility DNR order (yellow form or pink MOST form) Pink MOST form placed in chart (order not valid for inpatient use);Yellow form placed in chart (order not valid for inpatient use)     Chief Complaint  Patient presents with   Acute Visit    HPI: Patient is a 89 y.o. female seen today for an acute visit for Follow up Lives in SNF in [place]       Recently ST advanced her diet to Nectar thick Liquid from honey Patient had few episodes of Vomiting over the weekend  Nurses thought she was more Somnolence yesterday Did Labs Sodium 153 Creat 2.0 BUN 70 WBC 12.4 HGB 10.8 Gave her D5 Half NS Got 500 cc Vein got infiltrated  This morning she was more awake. 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, likely embolic given afib not on [facility] with Left Hemiplegia and Neglect, Dysphagia,in 11/23  Patient also has a history of CAD,  thrombocytopenia  history of GI bleed on Coumadin and  history of ruptured Baker's cyst with hemorrhage on Eliquis Also has history of hyperlipidemia and hyperglycemia  S/P PPM 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 Arminda Resides in March 2015: right neck, left neck, scalp    Bell's palsy 07/18/1979   Cervicalgia 01/31/2012   Closed fracture of lumbar vertebra without mention of spinal cord injury 07/17/2005   Conjunctiva disorder 12/26/2010   Coronary atherosclerosis of native coronary artery 07/17/1997   Cramp of limb 08/16/2009   Degeneration of lumbar or lumbosacral intervertebral disc 01/31/2012   Disturbance of skin sensation 08/2009   Dizziness and giddiness 11/08/2011   vertigo   External hemorrhoids without mention of complication 06/27/2010   External hemorrhoids without mention of complication 08/26/2012   Ganglion of tendon sheath 08/16/2009   Leg cramp 10/27/2013   Most frequently the right leg.    Long term (current) use of anticoagulants 09/2010   Lumbago 07/2009   Meralgia paresthetica 07/2007   MI, old    Myalgia and myositis, unspecified 01/17/2012   Osteoporosis    Other abnormal blood chemistry 04/08/2012   Other abnormal blood chemistry 2013   hyperglycemia   Other disorder of muscle, ligament, and fascia 04/08/2012   Other specified cardiac dysrhythmias(427.89) 06/27/2010   Pacemaker 05/06/2020  Pain in joint, ankle and foot 10/27/2013   Bilateral since 1998    Pain in joint, shoulder region 08/12/2012   Pain in joint, upper arm 12/26/2010   Pain in limb 01/11/2011   Pain in thoracic spine 01/31/2012   Pathologic fracture of vertebrae 01/22/2012   PVC's (premature ventricular contractions)    Rash and other nonspecific skin eruption 08/02/2011   Senile osteoporosis 07/17/1993   Stroke (HCC)    Unspecified essential hypertension 07/17/1997   Varicose veins of lower extremities 08/16/2009    Varicose veins of lower extremities 08/16/2009    Past Surgical History:  Procedure Laterality Date   COLONOSCOPY WITH PROPOFOL N/A 10/19/2015   Procedure: COLONOSCOPY WITH PROPOFOL;  Surgeon: Iva Boop, MD;  Location: [facility];  Service: Endoscopy;  Laterality: N/A;   CORONARY ARTERY BYPASS GRAFT  1998   x2; LIMA to LAD; SVG to diagonal off bypass   HEMORRHOID SURGERY  08/26/2012   Dr. Luisa Hart   LOOP RECORDER INSERTION N/A 05/13/2018   Procedure: LOOP RECORDER INSERTION;  Surgeon: Marinus Maw, MD;  Location: [facility] INVASIVE CV LAB;  Service: Cardiovascular;  Laterality: N/A;   LOOP RECORDER REMOVAL N/A 05/26/2019   Procedure: LOOP RECORDER REMOVAL;  Surgeon: Marinus Maw, MD;  Location: [facility] INVASIVE CV LAB;  Service: Cardiovascular;  Laterality: N/A;   MASS EXCISION Left 11/23/2015   Procedure: LEFT WRIST EXCISION CYST;  Surgeon: Betha Loa, MD;  Location: [facility];  Service: Orthopedics;  Laterality: Left;   PACEMAKER IMPLANT N/A 05/26/2019   Procedure: PACEMAKER IMPLANT;  Surgeon: Marinus Maw, MD;  Location: [facility] INVASIVE CV LAB;  Service: Cardiovascular;  Laterality: N/A;   SKIN BIOPSY  01/29/14   (R) neck; (R) scalp, 2 (L) neck; shave biopsy Superficial basal cell carcinoma Dr. Arminda Resides   TONSILLECTOMY  [phone]    Allergies  Allergen Reactions   Iodinated Contrast Media Nausea Only and Other (See Comments)    Severe nausea and also passed out   Iodine Nausea Only and Other (See Comments)    "Allergic," per [facility]- PASSED OUT   Lodine [Etodolac] Other (See Comments)    "Allergic," per [facility]   Other Other (See Comments)    Seasonal Allergies   Bactrim Rash   Relafen [Nabumetone] Rash   Sulfamethoxazole-Trimethoprim Rash    In matrix   Sulfanilamide Rash    Outpatient Encounter Medications as of 05/08/2023  Medication Sig   acetaminophen (TYLENOL) 325 MG tablet Take 650 mg by mouth 3 (three) times daily as needed (for pain).   amLODipine (NORVASC) 5  MG tablet Take 5 mg by mouth daily.   Artificial Saliva (BIOTENE DRY MOUTH MOISTURIZING) SOLN Use as directed 1-2 sprays in the mouth or throat 3 (three) times daily.   aspirin 81 MG chewable tablet Chew 1 tablet (81 mg total) by mouth daily.   atenolol (TENORMIN) 50 MG tablet Take 1 tablet (50 mg total) by mouth 2 (two) times daily.   Calcium Carb-Cholecalciferol (CALCIUM 500 + D3 PO) Take 1 capsule by mouth in the morning.   chlorhexidine (PERIDEX) 0.12 % solution Use as directed 15 mLs in the mouth or throat 2 (two) times daily.   diclofenac Sodium (VOLTAREN) 1 % GEL Apply 2 g topically as needed (BID PRN).   digoxin (LANOXIN) 0.125 MG tablet Take 1 tablet (0.125 mg total) by mouth every other day.   docusate (COLACE) 50 MG/5ML liquid Take 100 mg by mouth daily.   hydrALAZINE (APRESOLINE) 10  MG tablet Take 10 mg by mouth 3 (three) times daily as needed (SBP>180).   ipratropium (ATROVENT) 0.03 % nasal spray Place 2 sprays into both nostrils daily.   levocetirizine (XYZAL) 5 MG tablet Take 5 mg by mouth at bedtime.   losartan (COZAAR) 25 MG tablet Take 25 mg by mouth daily.   Multiple Vitamins-Minerals (MULTIVITAMIN WITH IRON-MINERALS) liquid Take 15 mLs by mouth daily.   polyethylene glycol (MIRALAX / GLYCOLAX) 17 g packet Take 17 g by mouth See admin instructions. Mix 17 grams of powder into 4-6 oz of beverage of choice and drink once a day   promethazine (PHENERGAN) 12.5 MG suppository Place 12.5 mg rectally every 6 (six) hours as needed for nausea or vomiting.   sodium chloride (OCEAN) 0.65 % nasal spray Place 1 spray into the nose 2 (two) times daily as needed for congestion.   No facility-administered encounter medications on file as of 05/08/2023.    Review of Systems:  Review of Systems  Unable to perform ROS: Other    Health Maintenance  Topic Date Due   DTaP/Tdap/Td (2 - Tdap) 10/06/2017   COVID-19 Vaccine (4 - 2023-24 season) 07/07/2022   Medicare Annual Wellness (AWV)   09/27/2022   INFLUENZA VACCINE  06/07/2023   Pneumonia Vaccine 54+ Years old  Completed   DEXA SCAN  Completed   Zoster Vaccines- Shingrix  Completed   HPV VACCINES  Aged Out    Physical Exam: Vitals:   05/08/23 2206  BP: 123/68  Pulse: 68  Resp: 14  Temp: (!) 97.5 F (36.4 C)  SpO2: 98%  Weight: 127 lb (57.6 kg)   Body mass index is 20.5 kg/m. Physical Exam Vitals reviewed.  Constitutional:      Comments: More awake today but had Left sided neglect and Aphasia  HENT:     Head: Normocephalic.     Nose: Nose normal.     Mouth/Throat:     Mouth: Mucous membranes are moist.     Pharynx: Oropharynx is clear.  Eyes:     Pupils: Pupils are equal, round, and reactive to light.  Cardiovascular:     Rate and Rhythm: Normal rate. Rhythm irregular.     Pulses: Normal pulses.     Heart sounds: Normal heart sounds. No murmur heard. Pulmonary:     Effort: Pulmonary effort is normal.     Breath sounds: Normal breath sounds.  Abdominal:     General: Abdomen is flat. Bowel sounds are normal.     Palpations: Abdomen is soft.  Musculoskeletal:        General: No swelling.     Cervical back: Neck supple.  Skin:    General: Skin is warm.  Neurological:     Mental Status: She is alert.     Comments: Was more alert this morning  Did have aphasia did respond to her name Left sided neglect  Psychiatric:        Mood and Affect: Mood normal.        Thought Content: Thought content normal.     Labs reviewed: Basic Metabolic Panel: Recent Labs    09/30/22 0931 10/01/22 0806 10/02/22 0900 10/21/22 0000 12/18/22 0000 01/02/23 0000 02/02/23 0000  NA 141 142 142   < > 145 141 142  K 3.6 3.3* 3.9   < > 4.2 4.0 3.9  CL 108 111 116*   < > 108 108 104  CO2 22 20* 19*   < > 25* 23* 22  GLUCOSE 107* 114* 108*  --   --   --   --   BUN 34* 36* 35*   < > 46* 38* 37*  CREATININE 1.25* 1.21* 1.09*   < > 1.0 1.1 1.1  CALCIUM 8.5* 8.2* 8.2*   < > 8.8 8.7 9.2   < > = values in this  interval not displayed.   Liver Function Tests: Recent Labs    09/26/22 1539 10/21/22 0000 12/18/22 0000 01/02/23 0000 02/02/23 0000  AST 17   < > 38* 21 21  ALT 12   < > 49* 21 17  ALKPHOS 71   < > 102 80 83  BILITOT 0.3  --   --   --   --   PROT 7.1  --   --   --   --   ALBUMIN 3.1*   < > 3.0* 3.1* 3.4*   < > = values in this interval not displayed.   No results for input(s): "LIPASE", "AMYLASE" in the last 8760 hours. No results for input(s): "AMMONIA" in the last 8760 hours. CBC: Recent Labs    09/30/22 0931 10/01/22 0806 10/02/22 0900 10/09/22 0000 12/18/22 0000 01/02/23 0000 02/02/23 0000 02/21/23 0000  WBC 10.7* 11.0* 9.9   < > 9.3 7.0 9.1 9.3  NEUTROABS  --   --   --    < > 5.40  --  5.70 5.00  HGB 9.2* 8.5* 8.8*   < > 8.5* 8.8* 9.2* 10.5*  HCT 28.9* 25.7* 27.2*   < > 26* 27* 30* 32*  MCV 88.1 87.1 86.6  --   --   --   --   --   PLT 96* 87* 109*   < > 130* 162 182 163   < > = values in this interval not displayed.   Lipid Panel: Recent Labs    08/01/22 0000 09/27/22 0546  CHOL 174 161  HDL 43 39*  LDLCALC 115 114*  TRIG 81 39  CHOLHDL  --  4.1   Lab Results  Component Value Date   HGBA1C 5.7 (H) 09/26/2022    Procedures since last visit: No results found.  Assessment/Plan 1. Hypernatremia Repeat BMP was not done Will give her 500 more cc and then repeat BMP ? Etiology No signs of infection Most likely due to her dysphagia 2. Acute renal insufficiency Give more fluids Repeat BMP pending  3. Oropharyngeal dysphagia ST downgraded to Puree fo rnow Also on Nectar Thigk   4. 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 Permanent atrial fibrillation (HCC) ***  6. Elevated brain natriuretic peptide (BNP) level With ? CHF on Xray doen here     Labs/tests ordered:  * No order type specified * Next appt:  Visit date not found

## 2023-05-09 DIAGNOSIS — I110 Hypertensive heart disease with heart failure: Secondary | ICD-10-CM | POA: Diagnosis not present

## 2023-05-09 LAB — BASIC METABOLIC PANEL
BUN: 62 — AB (ref 4–21)
BUN: 64 — AB (ref 4–21)
CO2: 23 — AB (ref 13–22)
CO2: 28 — AB (ref 13–22)
Chloride: 116 — AB (ref 99–108)
Chloride: 120 — AB (ref 99–108)
Creatinine: 1.5 — AB (ref 0.5–1.1)
Creatinine: 1.5 — AB (ref 0.5–1.1)
Glucose: 108
Glucose: 120
Potassium: 4.1 mEq/L (ref 3.5–5.1)
Potassium: 4.4 mEq/L (ref 3.5–5.1)
Sodium: 151 — AB (ref 137–147)
Sodium: 157 — AB (ref 137–147)

## 2023-05-09 LAB — COMPREHENSIVE METABOLIC PANEL
Calcium: 8.5 — AB (ref 8.7–10.7)
Calcium: 8.6 — AB (ref 8.7–10.7)
eGFR: 32
eGFR: 33

## 2023-05-10 DIAGNOSIS — Z79899 Other long term (current) drug therapy: Secondary | ICD-10-CM | POA: Diagnosis not present

## 2023-05-10 DIAGNOSIS — R1312 Dysphagia, oropharyngeal phase: Secondary | ICD-10-CM | POA: Diagnosis not present

## 2023-05-10 DIAGNOSIS — I69322 Dysarthria following cerebral infarction: Secondary | ICD-10-CM | POA: Diagnosis not present

## 2023-05-10 DIAGNOSIS — I69391 Dysphagia following cerebral infarction: Secondary | ICD-10-CM | POA: Diagnosis not present

## 2023-05-10 LAB — BASIC METABOLIC PANEL
BUN: 55 — AB (ref 4–21)
CO2: 26 — AB (ref 13–22)
Chloride: 116 — AB (ref 99–108)
Creatinine: 1.3 — AB (ref 0.5–1.1)
Glucose: 108
Potassium: 4 mEq/L (ref 3.5–5.1)
Sodium: 150 — AB (ref 137–147)

## 2023-05-10 LAB — COMPREHENSIVE METABOLIC PANEL
Calcium: 8.5 — AB (ref 8.7–10.7)
eGFR: 40

## 2023-05-11 ENCOUNTER — Encounter: Payer: Self-pay | Admitting: Adult Health

## 2023-05-11 ENCOUNTER — Non-Acute Institutional Stay (SKILLED_NURSING_FACILITY): Payer: Medicare Other | Admitting: Adult Health

## 2023-05-11 DIAGNOSIS — M6389 Disorders of muscle in diseases classified elsewhere, multiple sites: Secondary | ICD-10-CM | POA: Diagnosis not present

## 2023-05-11 DIAGNOSIS — R278 Other lack of coordination: Secondary | ICD-10-CM | POA: Diagnosis not present

## 2023-05-11 DIAGNOSIS — I10 Essential (primary) hypertension: Secondary | ICD-10-CM

## 2023-05-11 DIAGNOSIS — N179 Acute kidney failure, unspecified: Secondary | ICD-10-CM

## 2023-05-11 DIAGNOSIS — I6621 Occlusion and stenosis of right posterior cerebral artery: Secondary | ICD-10-CM | POA: Diagnosis not present

## 2023-05-11 DIAGNOSIS — I69354 Hemiplegia and hemiparesis following cerebral infarction affecting left non-dominant side: Secondary | ICD-10-CM | POA: Diagnosis not present

## 2023-05-11 DIAGNOSIS — E870 Hyperosmolality and hypernatremia: Secondary | ICD-10-CM | POA: Diagnosis not present

## 2023-05-11 DIAGNOSIS — I6521 Occlusion and stenosis of right carotid artery: Secondary | ICD-10-CM | POA: Diagnosis not present

## 2023-05-11 NOTE — Progress Notes (Unsigned)
Location:  [facility] Room Number: 118A Place of Service:  SNF [phone]) Provider:  Tamsen Roers, MD  Patient Care Team: Mahlon Gammon, MD as PCP - General (Internal Medicine) Marinus Maw, MD as PCP - Electrophysiology (Cardiology) Mathews Robinsons, MD as Consulting Physician (Dermatology) Marinus Maw, MD as Consulting Physician (Cardiology) [facility] Jeanella Craze, MD as Consulting Physician (Dermatology)  Extended Emergency Contact Information Primary Emergency Contact: Lynnell Grain of [place] Home Phone: [phone] Mobile Phone: [phone] Relation: Daughter Secondary Emergency Contact: Claud Kelp of [place] Home Phone: [phone] Mobile Phone: [phone] Relation: Son  Code Status:  DNR Goals of care: Advanced Directive information    05/11/2023   10:51 AM  Advanced Directives  Does Patient Have a Medical Advance Directive? Yes  Type of Estate agent of [place];Living will;Out of facility DNR (pink MOST or yellow form)  Does patient want to make changes to medical advance directive? No - Patient declined  Copy of Healthcare Power of Attorney in Chart? Yes - validated most recent copy scanned in chart (See row information)  Pre-existing out of facility DNR order (yellow form or pink MOST form) Pink MOST form placed in chart (order not valid for inpatient use);Yellow form placed in chart (order not valid for inpatient use)     Chief Complaint  Patient presents with   Acute Visit    Patient is being sen for acute hypernatremia    Immunizations    Discuss the need for Tdap and Covid vaccine   Quality Metric Gaps    Discuss the need for AWV    HPI:  Pt is a 89 y.o. female seen today for an acute visit for follow up regarding hypernatremia.   UXL:[id number] 10/02/22 and diagnosed with a right large PCA infarct s/p TNK. She  continues with symptoms of left sided neglect, weakness, dysphagia.  Has a hx of prior CVA as well along with afib, pacemaker, HTN, bakers cysts with bleeding, allergic rhinitis, back pain, CAD, anemia, and OP  Seen by Dr Chales Abrahams on 7/1 due to vomiting and somnolence.  Na returned at 153, BUN 70 Cr 2.00 She has received IVF D1/2 NS and later D5W.   05/11/23:No further vomiting. Bowels are moving. Tolerating D1 diet with HTL. (Did advanced to NTL at one point, now back on HTL) Voiding adequately.  No clear reason identified for the vomiting other than ARF CXR showed showed fluid overload but did not correlate with exam. BNP 300. She is not having any fever or decreased 02 sats.  05/10/23: Na 150 BUN 55 Cr 1.27 Much more alert. Took in around 1400 cc of fluid over the past 24 hrs.   Past Medical History:  Diagnosis Date   Actinic keratosis 05/25/2014   Anxiety    Atrial fibrillation (HCC) 09/21/2010   Basal cell carcinoma 05/25/2014   Multiple removed by Dr. Arminda Resides in March 2015: right neck, left neck, scalp    Bell's palsy 07/18/1979   Cervicalgia 01/31/2012   Closed fracture of lumbar vertebra without mention of spinal cord injury 07/17/2005   Conjunctiva disorder 12/26/2010   Coronary atherosclerosis of native coronary artery 07/17/1997   Cramp of limb 08/16/2009   Degeneration of lumbar or lumbosacral intervertebral disc 01/31/2012   Disturbance of skin sensation 08/2009   Dizziness and giddiness 11/08/2011   vertigo   External hemorrhoids without mention of complication 06/27/2010   External hemorrhoids without  mention of complication 08/26/2012   Ganglion of tendon sheath 08/16/2009   Leg cramp 10/27/2013   Most frequently the right leg.    Long term (current) use of anticoagulants 09/2010   Lumbago 07/2009   Meralgia paresthetica 07/2007   MI, old    Myalgia and myositis, unspecified 01/17/2012   Osteoporosis    Other abnormal blood chemistry 04/08/2012   Other  abnormal blood chemistry 2013   hyperglycemia   Other disorder of muscle, ligament, and fascia 04/08/2012   Other specified cardiac dysrhythmias(427.89) 06/27/2010   Pacemaker 05/06/2020   Pain in joint, ankle and foot 10/27/2013   Bilateral since 1998    Pain in joint, shoulder region 08/12/2012   Pain in joint, upper arm 12/26/2010   Pain in limb 01/11/2011   Pain in thoracic spine 01/31/2012   Pathologic fracture of vertebrae 01/22/2012   PVC's (premature ventricular contractions)    Rash and other nonspecific skin eruption 08/02/2011   Senile osteoporosis 07/17/1993   Stroke (HCC)    Unspecified essential hypertension 07/17/1997   Varicose veins of lower extremities 08/16/2009   Varicose veins of lower extremities 08/16/2009   Past Surgical History:  Procedure Laterality Date   COLONOSCOPY WITH PROPOFOL N/A 10/19/2015   Procedure: COLONOSCOPY WITH PROPOFOL;  Surgeon: Iva Boop, MD;  Location: [facility];  Service: Endoscopy;  Laterality: N/A;   CORONARY ARTERY BYPASS GRAFT  1998   x2; LIMA to LAD; SVG to diagonal off bypass   HEMORRHOID SURGERY  08/26/2012   Dr. Luisa Hart   LOOP RECORDER INSERTION N/A 05/13/2018   Procedure: LOOP RECORDER INSERTION;  Surgeon: Marinus Maw, MD;  Location: [facility] INVASIVE CV LAB;  Service: Cardiovascular;  Laterality: N/A;   LOOP RECORDER REMOVAL N/A 05/26/2019   Procedure: LOOP RECORDER REMOVAL;  Surgeon: Marinus Maw, MD;  Location: [facility] INVASIVE CV LAB;  Service: Cardiovascular;  Laterality: N/A;   MASS EXCISION Left 11/23/2015   Procedure: LEFT WRIST EXCISION CYST;  Surgeon: Betha Loa, MD;  Location: [facility];  Service: Orthopedics;  Laterality: Left;   PACEMAKER IMPLANT N/A 05/26/2019   Procedure: PACEMAKER IMPLANT;  Surgeon: Marinus Maw, MD;  Location: [facility] INVASIVE CV LAB;  Service: Cardiovascular;  Laterality: N/A;   SKIN BIOPSY  01/29/14   (R) neck; (R) scalp, 2 (L) neck; shave biopsy Superficial basal cell  carcinoma Dr. Arminda Resides   TONSILLECTOMY  [phone]    Allergies  Allergen Reactions   Iodinated Contrast Media Nausea Only and Other (See Comments)    Severe nausea and also passed out   Iodine Nausea Only and Other (See Comments)    "Allergic," per [facility]- PASSED OUT   Lodine [Etodolac] Other (See Comments)    "Allergic," per [facility]   Other Other (See Comments)    Seasonal Allergies   Bactrim Rash   Relafen [Nabumetone] Rash   Sulfamethoxazole-Trimethoprim Rash    In matrix   Sulfanilamide Rash    Outpatient Encounter Medications as of 05/11/2023  Medication Sig   acetaminophen (TYLENOL) 325 MG tablet Take 650 mg by mouth 3 (three) times daily as needed (for pain).   amLODipine (NORVASC) 5 MG tablet Take 5 mg by mouth daily.   Artificial Saliva (BIOTENE DRY MOUTH MOISTURIZING) SOLN Use as directed 1-2 sprays in the mouth or throat 3 (three) times daily.   aspirin 81 MG chewable tablet Chew 1 tablet (81 mg total) by mouth daily.   atenolol (TENORMIN) 50 MG tablet Take 1 tablet (50 mg  total) by mouth 2 (two) times daily.   Calcium Carb-Cholecalciferol (CALCIUM 500 + D3 PO) Take 1 capsule by mouth in the morning.   chlorhexidine (PERIDEX) 0.12 % solution Use as directed 15 mLs in the mouth or throat 2 (two) times daily.   diclofenac Sodium (VOLTAREN) 1 % GEL Apply 2 g topically as needed (BID PRN).   digoxin (LANOXIN) 0.125 MG tablet Take 1 tablet (0.125 mg total) by mouth every other day.   docusate (COLACE) 50 MG/5ML liquid Take 100 mg by mouth daily.   hydrALAZINE (APRESOLINE) 10 MG tablet Take 10 mg by mouth 3 (three) times daily as needed (SBP>180).   ipratropium (ATROVENT) 0.03 % nasal spray Place 2 sprays into both nostrils daily.   levocetirizine (XYZAL) 5 MG tablet Take 5 mg by mouth at bedtime.   losartan (COZAAR) 25 MG tablet Take 25 mg by mouth daily.   Multiple Vitamins-Minerals (MULTIVITAMIN WITH IRON-MINERALS) liquid Take 15 mLs by mouth daily.   polyethylene glycol  (MIRALAX / GLYCOLAX) 17 g packet Take 17 g by mouth See admin instructions. Mix 17 grams of powder into 4-6 oz of beverage of choice and drink once a day   promethazine (PHENERGAN) 12.5 MG suppository Place 12.5 mg rectally every 6 (six) hours as needed for nausea or vomiting. 12.5 mg, rectal, Every 6 Hours - PRN, Promethazine suppository 12.5 mg every 6 hours as needed if vomiting or cannot take PO.   sodium chloride (OCEAN) 0.65 % nasal spray Place 1 spray into the nose 2 (two) times daily as needed for congestion.   [DISCONTINUED] promethazine (PHENERGAN) 12.5 MG suppository Place 12.5 mg rectally every 6 (six) hours as needed for nausea or vomiting. (Patient not taking: Reported on 05/11/2023)   No facility-administered encounter medications on file as of 05/11/2023.    Review of Systems  Constitutional:  Negative for activity change, appetite change, chills, diaphoresis, fatigue, fever and unexpected weight change.  HENT:  Positive for rhinorrhea (chronic).   Respiratory:  Negative for cough, shortness of breath and wheezing.   Cardiovascular:  Negative for chest pain, palpitations and leg swelling.  Gastrointestinal:  Negative for abdominal distention, abdominal pain, constipation and diarrhea.  Genitourinary:  Negative for difficulty urinating and dysuria.  Musculoskeletal:  Positive for gait problem. Negative for arthralgias, back pain, joint swelling and myalgias.  Neurological:  Positive for facial asymmetry and weakness. Negative for dizziness, tremors, seizures, syncope, speech difficulty, light-headedness, numbness and headaches.  Psychiatric/Behavioral:  Positive for confusion. Negative for agitation and behavioral problems.     Immunization History  Administered Date(s) Administered   Influenza Whole 08/06/2012   Influenza, High Dose Seasonal PF 08/15/2019, 08/27/2020   Influenza,inj,Quad PF,6+ Mos 08/30/2018   Influenza-Unspecified 08/06/2013, 08/24/2014, 08/26/2015, 08/31/2016,  08/27/2017, 08/27/2020   Moderna Sars-Covid-2 Vaccination 11/16/2019, 12/16/2019, 09/16/2020   Pneumococcal Conjugate-13 09/22/2015   Pneumococcal Polysaccharide-23 08/29/2006   Td 10/07/2007   Zoster Recombinant(Shingrix) 02/12/2018, 05/13/2018   Zoster, Live 03/12/2008   Pertinent  Health Maintenance Due  Topic Date Due   INFLUENZA VACCINE  06/07/2023   DEXA SCAN  Completed      10/02/2022    8:00 AM 10/03/2022   10:49 AM 10/04/2022    9:50 AM 10/06/2022   10:31 AM 12/01/2022    9:28 AM  Fall Risk  Falls in the past year?  1 1 1 1   Was there an injury with Fall?  1 1 1 1   Fall Risk Category Calculator  3 3 2 2   Fall  Risk Category (Retired)  High High Moderate   (RETIRED) Patient Fall Risk Level High fall risk High fall risk High fall risk High fall risk   Patient at Risk for Falls Due to  History of fall(s) History of fall(s) History of fall(s) History of fall(s)  Fall risk Follow up  Falls evaluation completed Falls evaluation completed Falls evaluation completed Falls evaluation completed   Functional Status Survey:    Vitals:   05/11/23 1045 05/12/23 0820  BP: (!) 147/79 115/65  Pulse: 74   Resp: 14   Temp: 97.6 F (36.4 C)   TempSrc: Temporal   SpO2: 100%   Weight: 126 lb 12.8 oz (57.5 kg)   Height: 5\' 6"  (1.676 m)    Body mass index is 20.47 kg/m. Physical Exam Vitals and nursing note reviewed.  Constitutional:      General: She is not in acute distress.    Appearance: She is not diaphoretic.  HENT:     Head: Normocephalic and atraumatic.     Mouth/Throat:     Mouth: Mucous membranes are moist.     Pharynx: Oropharynx is clear.  Neck:     Vascular: No JVD.  Cardiovascular:     Rate and Rhythm: Normal rate. Rhythm irregular.     Heart sounds: No murmur heard. Pulmonary:     Effort: Pulmonary effort is normal. No respiratory distress.     Breath sounds: Normal breath sounds. No wheezing.  Skin:    General: Skin is warm and dry.  Neurological:      Mental Status: She is alert. Mental status is at baseline.     Comments: Left sided weakness and neglect     Labs reviewed: Recent Labs    09/30/22 0931 10/01/22 0806 10/02/22 0900 10/21/22 0000 05/07/23 0000 05/09/23 0000 05/10/23 0000  NA 141 142 142   < > 153* 151*  157* 150*  K 3.6 3.3* 3.9   < > 4.4 4.4  4.1 4.0  CL 108 111 116*   < > 118* 116*  120* 116*  CO2 22 20* 19*   < > 26* 28*  23* 26*  GLUCOSE 107* 114* 108*  --   --   --   --   BUN 34* 36* 35*   < > 70* 62*  64* 55*  CREATININE 1.25* 1.21* 1.09*   < > 2.0* 1.5*  1.5* 1.3*  CALCIUM 8.5* 8.2* 8.2*   < > 8.7 8.6*  8.5* 8.5*   < > = values in this interval not displayed.   Recent Labs    09/26/22 1539 10/21/22 0000 01/02/23 0000 02/02/23 0000 05/07/23 0000  AST 17   < > 21 21 24   ALT 12   < > 21 17 16   ALKPHOS 71   < > 80 83 57  BILITOT 0.3  --   --   --   --   PROT 7.1  --   --   --   --   ALBUMIN 3.1*   < > 3.1* 3.4* 3.3*   < > = values in this interval not displayed.   Recent Labs    09/30/22 0931 10/01/22 0806 10/02/22 0900 10/09/22 0000 02/02/23 0000 02/21/23 0000 05/07/23 0000  WBC 10.7* 11.0* 9.9   < > 9.1 9.3 12.4  NEUTROABS  --   --   --    < > 5.70 5.00 7.90  HGB 9.2* 8.5* 8.8*   < > 9.2* 10.5* 10.6*  HCT 28.9* 25.7* 27.2*   < > 30* 32* 32*  MCV 88.1 87.1 86.6  --   --   --   --   PLT 96* 87* 109*   < > 182 163 148*   < > = values in this interval not displayed.   Lab Results  Component Value Date   TSH 2.87 02/03/2022   Lab Results  Component Value Date   HGBA1C 5.7 (H) 09/26/2022   Lab Results  Component Value Date   CHOL 161 09/27/2022   HDL 39 (L) 09/27/2022   LDLCALC 114 (H) 09/27/2022   LDLDIRECT 81.3 08/19/2020   TRIG 39 09/27/2022   CHOLHDL 4.1 09/27/2022    Significant Diagnostic Results in last 30 days:  No results found.  Assessment/Plan  1. Hypernatremia Improved with IVF and oral intake.  Continue to encourage oral intake and recheck  05/14/23 Discussed with her daughter that due to her prior CVA she is having difficulty keeping hydrated and has a reduced thirst mechanism. This may occur again and encouraging hydration is important. Careful consideration for continued intervention considered.   2. Acute renal failure, unspecified acute renal failure type (HCC) Improved with IVF D/C losartan for now  3. Essential hypertension Will d/c losartan for now and can add back later Monitor bp and use prn hydralazine if indicated    Family/ staff Communication: discussed with daughter Marylu Lund and nurse Misti  Labs/tests ordered:  [facility] Monday 05/14/23

## 2023-05-12 ENCOUNTER — Encounter: Payer: Self-pay | Admitting: Adult Health

## 2023-05-14 DIAGNOSIS — I69391 Dysphagia following cerebral infarction: Secondary | ICD-10-CM | POA: Diagnosis not present

## 2023-05-14 DIAGNOSIS — R1312 Dysphagia, oropharyngeal phase: Secondary | ICD-10-CM | POA: Diagnosis not present

## 2023-05-14 DIAGNOSIS — I69322 Dysarthria following cerebral infarction: Secondary | ICD-10-CM | POA: Diagnosis not present

## 2023-05-14 DIAGNOSIS — Z79899 Other long term (current) drug therapy: Secondary | ICD-10-CM | POA: Diagnosis not present

## 2023-05-14 LAB — COMPREHENSIVE METABOLIC PANEL: Calcium: 8.6 — AB (ref 8.7–10.7)

## 2023-05-14 LAB — BASIC METABOLIC PANEL
BUN: 43 — AB (ref 4–21)
CO2: 23 — AB (ref 13–22)
Chloride: 114 — AB (ref 99–108)
Creatinine: 1.2 — AB (ref 0.5–1.1)
Glucose: 93
Potassium: 4.6 mEq/L (ref 3.5–5.1)
Sodium: 150 — AB (ref 137–147)

## 2023-05-15 ENCOUNTER — Non-Acute Institutional Stay (SKILLED_NURSING_FACILITY): Payer: Medicare Other | Admitting: Orthopedic Surgery

## 2023-05-15 ENCOUNTER — Encounter: Payer: Self-pay | Admitting: Orthopedic Surgery

## 2023-05-15 DIAGNOSIS — I69391 Dysphagia following cerebral infarction: Secondary | ICD-10-CM | POA: Diagnosis not present

## 2023-05-15 DIAGNOSIS — L89321 Pressure ulcer of left buttock, stage 1: Secondary | ICD-10-CM

## 2023-05-15 DIAGNOSIS — N179 Acute kidney failure, unspecified: Secondary | ICD-10-CM | POA: Diagnosis not present

## 2023-05-15 DIAGNOSIS — R1312 Dysphagia, oropharyngeal phase: Secondary | ICD-10-CM

## 2023-05-15 DIAGNOSIS — E870 Hyperosmolality and hypernatremia: Secondary | ICD-10-CM | POA: Diagnosis not present

## 2023-05-15 DIAGNOSIS — I69322 Dysarthria following cerebral infarction: Secondary | ICD-10-CM | POA: Diagnosis not present

## 2023-05-15 NOTE — Progress Notes (Signed)
[facility] Room Number: 118-A Place of Service:  SNF [phone]) Provider:  Hazle Nordmann, NP  PCP: Mahlon Gammon, MD  Patient Care Team: Mahlon Gammon, MD as PCP - General (Internal Medicine) Marinus Maw, MD as PCP - Electrophysiology (Cardiology) Mathews Robinsons, MD as Consulting Physician (Dermatology) Marinus Maw, MD as Consulting Physician (Cardiology) [facility] Jeanella Craze, MD as Consulting Physician (Dermatology)  Extended Emergency Contact Information Primary Emergency Contact: Lynnell Grain of [place] Home Phone: [phone] Mobile Phone: [phone] Relation: Daughter Secondary Emergency Contact: Claud Kelp of [place] Home Phone: [phone] Mobile Phone: [phone] Relation: Son  Code Status:  DNR Goals of care: Advanced Directive information    05/15/2023   10:23 AM  Advanced Directives  Does Patient Have a Medical Advance Directive? Yes  Type of Estate agent of [place];Living will;Out of facility DNR (pink MOST or yellow form)  Does patient want to make changes to medical advance directive? No - Patient declined  Copy of Healthcare Power of Attorney in Chart? Yes - validated most recent copy scanned in chart (See row information)     Chief Complaint  Patient presents with   Acute Visit    Hypernatremia.     HPI:  Pt is a 89 y.o. female seen today for an acute visit due to ongoing hypernatremia.   She currently resides on the skilled nursing unit at [facility]. Past medical history includes: atrial fibrillation, CVA with left sided paralysis,  AV block, CAD, hypertension, GERD, Bakers cyst with bleeding, constipation, and dysphagia.     Hospitalized 11/21-11/27 due to acute right PCA territory infarct s/p TNK.   07/01 she had episodes of vomiting. Lab work revealed Na+ 153, BUN/creat 70/2.0. She was initially given IVF  D1/2 NS and then D5W. 07/05 vomiting subsided. CXR showed fluid overload. BNP 300. 07/05 Na+ 150, BUN/creat 55/1.27. Her po intake began to improve per nursing, I/O records.   07/08 Na+ 150 BUN/creat 42/1.15. Oral intake continues to vary due to dysphagia. Today, she is more alert and able to answer simple questions. Appears oral hydration has improved some. Afebrile. Vital stable.   Past Medical History:  Diagnosis Date   Actinic keratosis 05/25/2014   Anxiety    Atrial fibrillation (HCC) 09/21/2010   Basal cell carcinoma 05/25/2014   Multiple removed by Dr. Arminda Resides in March 2015: right neck, left neck, scalp    Bell's palsy 07/18/1979   Cervicalgia 01/31/2012   Closed fracture of lumbar vertebra without mention of spinal cord injury 07/17/2005   Conjunctiva disorder 12/26/2010   Coronary atherosclerosis of native coronary artery 07/17/1997   Cramp of limb 08/16/2009   Degeneration of lumbar or lumbosacral intervertebral disc 01/31/2012   Disturbance of skin sensation 08/2009   Dizziness and giddiness 11/08/2011   vertigo   External hemorrhoids without mention of complication 06/27/2010   External hemorrhoids without mention of complication 08/26/2012   Ganglion of tendon sheath 08/16/2009   Leg cramp 10/27/2013   Most frequently the right leg.    Long term (current) use of anticoagulants 09/2010   Lumbago 07/2009   Meralgia paresthetica 07/2007   MI, old    Myalgia and myositis, unspecified 01/17/2012   Osteoporosis    Other abnormal blood chemistry 04/08/2012   Other abnormal blood chemistry 2013   hyperglycemia   Other disorder of muscle, ligament, and fascia 04/08/2012   Other specified cardiac dysrhythmias(427.89) 06/27/2010  Pacemaker 05/06/2020   Pain in joint, ankle and foot 10/27/2013   Bilateral since 1998    Pain in joint, shoulder region 08/12/2012   Pain in joint, upper arm 12/26/2010   Pain in limb 01/11/2011   Pain in thoracic spine 01/31/2012    Pathologic fracture of vertebrae 01/22/2012   PVC's (premature ventricular contractions)    Rash and other nonspecific skin eruption 08/02/2011   Senile osteoporosis 07/17/1993   Stroke (HCC)    Unspecified essential hypertension 07/17/1997   Varicose veins of lower extremities 08/16/2009   Varicose veins of lower extremities 08/16/2009   Past Surgical History:  Procedure Laterality Date   COLONOSCOPY WITH PROPOFOL N/A 10/19/2015   Procedure: COLONOSCOPY WITH PROPOFOL;  Surgeon: Iva Boop, MD;  Location: [facility];  Service: Endoscopy;  Laterality: N/A;   CORONARY ARTERY BYPASS GRAFT  1998   x2; LIMA to LAD; SVG to diagonal off bypass   HEMORRHOID SURGERY  08/26/2012   Dr. Luisa Hart   LOOP RECORDER INSERTION N/A 05/13/2018   Procedure: LOOP RECORDER INSERTION;  Surgeon: Marinus Maw, MD;  Location: [facility] INVASIVE CV LAB;  Service: Cardiovascular;  Laterality: N/A;   LOOP RECORDER REMOVAL N/A 05/26/2019   Procedure: LOOP RECORDER REMOVAL;  Surgeon: Marinus Maw, MD;  Location: [facility] INVASIVE CV LAB;  Service: Cardiovascular;  Laterality: N/A;   MASS EXCISION Left 11/23/2015   Procedure: LEFT WRIST EXCISION CYST;  Surgeon: Betha Loa, MD;  Location: [facility];  Service: Orthopedics;  Laterality: Left;   PACEMAKER IMPLANT N/A 05/26/2019   Procedure: PACEMAKER IMPLANT;  Surgeon: Marinus Maw, MD;  Location: [facility] INVASIVE CV LAB;  Service: Cardiovascular;  Laterality: N/A;   SKIN BIOPSY  01/29/14   (R) neck; (R) scalp, 2 (L) neck; shave biopsy Superficial basal cell carcinoma Dr. Arminda Resides   TONSILLECTOMY  [phone]    Allergies  Allergen Reactions   Iodinated Contrast Media Nausea Only and Other (See Comments)    Severe nausea and also passed out   Iodine Nausea Only and Other (See Comments)    "Allergic," per [facility]- PASSED OUT   Lodine [Etodolac] Other (See Comments)    "Allergic," per [facility]   Other Other (See Comments)    Seasonal Allergies   Bactrim Rash    Relafen [Nabumetone] Rash   Sulfamethoxazole-Trimethoprim Rash    In matrix   Sulfanilamide Rash    Allergies as of 05/15/2023       Reactions   Iodinated Contrast Media Nausea Only, Other (See Comments)   Severe nausea and also passed out   Iodine Nausea Only, Other (See Comments)   "Allergic," per [facility]- PASSED OUT   Lodine [etodolac] Other (See Comments)   "Allergic," per [facility]   Other Other (See Comments)   Seasonal Allergies   Bactrim Rash   Relafen [nabumetone] Rash   Sulfamethoxazole-trimethoprim Rash   In matrix   Sulfanilamide Rash        Medication List        Accurate as of May 15, 2023 10:23 AM. If you have any questions, ask your nurse or doctor.          acetaminophen 325 MG tablet Commonly known as: TYLENOL Take 650 mg by mouth 3 (three) times daily as needed (for pain).   amLODipine 5 MG tablet Commonly known as: NORVASC Take 5 mg by mouth daily.   Aspirin Low Dose 81 MG chewable tablet Generic drug: aspirin Chew 1 tablet (81 mg total) by mouth  daily.   atenolol 50 MG tablet Commonly known as: TENORMIN Take 1 tablet (50 mg total) by mouth 2 (two) times daily.   Biotene Dry Mouth Moisturizing Soln Use as directed 1-2 sprays in the mouth or throat 3 (three) times daily.   CALCIUM 500 + D3 PO Take 1 capsule by mouth in the morning.   chlorhexidine 0.12 % solution Commonly known as: Peridex Use as directed 15 mLs in the mouth or throat 2 (two) times daily.   digoxin 0.125 MG tablet Commonly known as: LANOXIN Take 1 tablet (0.125 mg total) by mouth every other day.   docusate 50 MG/5ML liquid Commonly known as: COLACE Take 100 mg by mouth daily. What changed: Another medication with the same name was removed. Continue taking this medication, and follow the directions you see here. Changed by: Octavia Heir, NP   hydrALAZINE 10 MG tablet Commonly known as: APRESOLINE Take 10 mg by mouth 3 (three) times daily as needed (SBP>180).    ipratropium 0.03 % nasal spray Commonly known as: ATROVENT Place 2 sprays into both nostrils daily.   levocetirizine 5 MG tablet Commonly known as: XYZAL Take 5 mg by mouth at bedtime.   multivitamin with iron-minerals liquid Take 15 mLs by mouth daily.   polyethylene glycol 17 g packet Commonly known as: MIRALAX / GLYCOLAX Take 17 g by mouth See admin instructions. Mix 17 grams of powder into 4-6 oz of beverage of choice and drink once a day   promethazine 12.5 MG suppository Commonly known as: PHENERGAN Place 12.5 mg rectally every 6 (six) hours as needed for nausea or vomiting. 12.5 mg, rectal, Every 6 Hours - PRN, Promethazine suppository 12.5 mg every 6 hours as needed if vomiting or cannot take PO.   sodium chloride 0.65 % nasal spray Commonly known as: OCEAN Place 1 spray into the nose 2 (two) times daily as needed for congestion.   Voltaren 1 % Gel Generic drug: diclofenac Sodium Apply 4 g topically as needed. What changed: Another medication with the same name was removed. Continue taking this medication, and follow the directions you see here. Changed by: Octavia Heir, NP        Review of Systems  Unable to perform ROS: Dementia    Immunization History  Administered Date(s) Administered   Influenza Whole 08/06/2012   Influenza, High Dose Seasonal PF 08/15/2019, 08/27/2020   Influenza,inj,Quad PF,6+ Mos 08/30/2018   Influenza-Unspecified 08/06/2013, 08/24/2014, 08/26/2015, 08/31/2016, 08/27/2017, 08/27/2020   Moderna Sars-Covid-2 Vaccination 11/16/2019, 12/16/2019, 09/16/2020   Pneumococcal Conjugate-13 09/22/2015   Pneumococcal Polysaccharide-23 08/29/2006   Td 10/07/2007   Zoster Recombinant(Shingrix) 02/12/2018, 05/13/2018   Zoster, Live 03/12/2008   Pertinent  Health Maintenance Due  Topic Date Due   INFLUENZA VACCINE  06/07/2023   DEXA SCAN  Completed      10/02/2022    8:00 AM 10/03/2022   10:49 AM 10/04/2022    9:50 AM 10/06/2022   10:31 AM  12/01/2022    9:28 AM  Fall Risk  Falls in the past year?  1 1 1 1   Was there an injury with Fall?  1 1 1 1   Fall Risk Category Calculator  3 3 2 2   Fall Risk Category (Retired)  High High Moderate   (RETIRED) Patient Fall Risk Level High fall risk High fall risk High fall risk High fall risk   Patient at Risk for Falls Due to  History of fall(s) History of fall(s) History of fall(s) History of  fall(s)  Fall risk Follow up  Falls evaluation completed Falls evaluation completed Falls evaluation completed Falls evaluation completed   Functional Status Survey:    Vitals:   05/15/23 1004  BP: (!) 158/71  Pulse: 66  Resp: (!) 22  Temp: 97.7 F (36.5 C)  SpO2: 97%  Weight: 126 lb 12.8 oz (57.5 kg)  Height: 5\' 6"  (1.676 m)   Body mass index is 20.47 kg/m. Physical Exam Vitals reviewed.  Constitutional:      General: She is not in acute distress. HENT:     Head: Normocephalic.  Eyes:     General:        Right eye: No discharge.        Left eye: No discharge.  Cardiovascular:     Rate and Rhythm: Normal rate. Rhythm irregular.     Pulses: Normal pulses.     Heart sounds: Normal heart sounds.  Pulmonary:     Effort: Pulmonary effort is normal. No respiratory distress.     Breath sounds: Normal breath sounds. No wheezing or rales.  Abdominal:     General: Bowel sounds are normal.     Palpations: Abdomen is soft.  Musculoskeletal:     Cervical back: Neck supple.     Right lower leg: Edema present.     Left lower leg: Edema present.     Comments: Non pitting  Skin:    General: Skin is warm.     Capillary Refill: Capillary refill takes less than 2 seconds.  Neurological:     General: No focal deficit present.     Mental Status: She is alert. Mental status is at baseline.     Motor: Weakness present.     Gait: Gait abnormal.     Comments: Left sided weakness   Psychiatric:        Mood and Affect: Mood normal.     Labs reviewed: Recent Labs    09/30/22 0931  10/01/22 0806 10/02/22 0900 10/21/22 0000 05/07/23 0000 05/09/23 0000 05/10/23 0000  NA 141 142 142   < > 153* 151*  157* 150*  K 3.6 3.3* 3.9   < > 4.4 4.4  4.1 4.0  CL 108 111 116*   < > 118* 116*  120* 116*  CO2 22 20* 19*   < > 26* 28*  23* 26*  GLUCOSE 107* 114* 108*  --   --   --   --   BUN 34* 36* 35*   < > 70* 62*  64* 55*  CREATININE 1.25* 1.21* 1.09*   < > 2.0* 1.5*  1.5* 1.3*  CALCIUM 8.5* 8.2* 8.2*   < > 8.7 8.6*  8.5* 8.5*   < > = values in this interval not displayed.   Recent Labs    09/26/22 1539 10/21/22 0000 01/02/23 0000 02/02/23 0000 05/07/23 0000  AST 17   < > 21 21 24   ALT 12   < > 21 17 16   ALKPHOS 71   < > 80 83 57  BILITOT 0.3  --   --   --   --   PROT 7.1  --   --   --   --   ALBUMIN 3.1*   < > 3.1* 3.4* 3.3*   < > = values in this interval not displayed.   Recent Labs    09/30/22 0931 10/01/22 0806 10/02/22 0900 10/09/22 0000 02/02/23 0000 02/21/23 0000 05/07/23 0000  WBC 10.7* 11.0* 9.9   < >  9.1 9.3 12.4  NEUTROABS  --   --   --    < > 5.70 5.00 7.90  HGB 9.2* 8.5* 8.8*   < > 9.2* 10.5* 10.6*  HCT 28.9* 25.7* 27.2*   < > 30* 32* 32*  MCV 88.1 87.1 86.6  --   --   --   --   PLT 96* 87* 109*   < > 182 163 148*   < > = values in this interval not displayed.   Lab Results  Component Value Date   TSH 2.87 02/03/2022   Lab Results  Component Value Date   HGBA1C 5.7 (H) 09/26/2022   Lab Results  Component Value Date   CHOL 161 09/27/2022   HDL 39 (L) 09/27/2022   LDLCALC 114 (H) 09/27/2022   LDLDIRECT 81.3 08/19/2020   TRIG 39 09/27/2022   CHOLHDL 4.1 09/27/2022    Significant Diagnostic Results in last 30 days:  No results found.  Assessment/Plan 1. Hypernatremia - Na+ 150 (07/08)> 150 (07/05)> 157 (07/03)> 145 (02/12) - related to poor oral intake/ dysphagia - insert IV - start D5W 250 cc bolus @ 75 cc/hr - recheck bmp 07/10  2. Acute renal failure, unspecified acute renal failure type (HCC) - BUN/creat  42.8/ 1.15 (07/08)> was 55/1.27 (07/05) - encourage oral hydration - avoid NSAIDS - see above  3. Oropharyngeal dysphagia - ongoing> worsened after last CVA - followed by ST - cont pureed/DYS 1, honey thick fluids  4. Pressure injury of left buttock, stage 1 - cont Allevyn and barrier cream - encourage frequent position change every few hours    Family/ staff Communication: plan discussed with patient and nurse  Labs/tests ordered:   bmp 07/10

## 2023-05-16 DIAGNOSIS — Z79899 Other long term (current) drug therapy: Secondary | ICD-10-CM | POA: Diagnosis not present

## 2023-05-16 LAB — BASIC METABOLIC PANEL
BUN: 44 — AB (ref 4–21)
CO2: 18 (ref 13–22)
Chloride: 111 — AB (ref 99–108)
Creatinine: 1.2 — AB (ref 0.5–1.1)
Glucose: 87
Potassium: 4.5 mEq/L (ref 3.5–5.1)
Sodium: 147 (ref 137–147)

## 2023-05-16 LAB — COMPREHENSIVE METABOLIC PANEL: Calcium: 9 (ref 8.7–10.7)

## 2023-05-17 ENCOUNTER — Encounter: Payer: Self-pay | Admitting: Orthopedic Surgery

## 2023-05-17 ENCOUNTER — Non-Acute Institutional Stay (SKILLED_NURSING_FACILITY): Payer: Medicare Other | Admitting: Orthopedic Surgery

## 2023-05-17 DIAGNOSIS — R278 Other lack of coordination: Secondary | ICD-10-CM | POA: Diagnosis not present

## 2023-05-17 DIAGNOSIS — R1312 Dysphagia, oropharyngeal phase: Secondary | ICD-10-CM | POA: Diagnosis not present

## 2023-05-17 DIAGNOSIS — E870 Hyperosmolality and hypernatremia: Secondary | ICD-10-CM

## 2023-05-17 DIAGNOSIS — I69354 Hemiplegia and hemiparesis following cerebral infarction affecting left non-dominant side: Secondary | ICD-10-CM | POA: Diagnosis not present

## 2023-05-17 DIAGNOSIS — M6389 Disorders of muscle in diseases classified elsewhere, multiple sites: Secondary | ICD-10-CM | POA: Diagnosis not present

## 2023-05-17 DIAGNOSIS — I69391 Dysphagia following cerebral infarction: Secondary | ICD-10-CM | POA: Diagnosis not present

## 2023-05-17 DIAGNOSIS — I69322 Dysarthria following cerebral infarction: Secondary | ICD-10-CM | POA: Diagnosis not present

## 2023-05-17 DIAGNOSIS — I6521 Occlusion and stenosis of right carotid artery: Secondary | ICD-10-CM | POA: Diagnosis not present

## 2023-05-17 DIAGNOSIS — N179 Acute kidney failure, unspecified: Secondary | ICD-10-CM | POA: Diagnosis not present

## 2023-05-17 DIAGNOSIS — I6621 Occlusion and stenosis of right posterior cerebral artery: Secondary | ICD-10-CM | POA: Diagnosis not present

## 2023-05-17 NOTE — Progress Notes (Signed)
[facility] Room Number: 118-A Place of Service:  SNF [phone]) Provider:  Hazle Nordmann, NP  PCP: Mahlon Gammon, MD  Patient Care Team: Mahlon Gammon, MD as PCP - General (Internal Medicine) Marinus Maw, MD as PCP - Electrophysiology (Cardiology) Mathews Robinsons, MD as Consulting Physician (Dermatology) Marinus Maw, MD as Consulting Physician (Cardiology) [facility] Jeanella Craze, MD as Consulting Physician (Dermatology)  Extended Emergency Contact Information Primary Emergency Contact: Lynnell Grain of [place] Home Phone: [phone] Mobile Phone: [phone] Relation: Daughter Secondary Emergency Contact: Claud Kelp of [place] Home Phone: [phone] Mobile Phone: [phone] Relation: Son  Code Status:  DNR Goals of care: Advanced Directive information    05/17/2023    3:22 PM  Advanced Directives  Does Patient Have a Medical Advance Directive? Yes  Type of Estate agent of [place];Living will;Out of facility DNR (pink MOST or yellow form)  Does patient want to make changes to medical advance directive? No - Patient declined  Copy of Healthcare Power of Attorney in Chart? Yes - validated most recent copy scanned in chart (See row information)     Chief Complaint  Patient presents with   Acute Visit    Hypernatremia     HPI:  Pt is a 89 y.o. female seen today for an acute visit due to hyponatremia.   She currently resides on the skilled nursing unit at [facility]. Past medical history includes: atrial fibrillation, CVA with left sided paralysis,  AV block, CAD, hypertension, GERD, Bakers cyst with bleeding, constipation, and dysphagia.    "07/01 she had episodes of vomiting. Lab work revealed Na+ 153, BUN/creat 70/2.0. She was initially given IVF D1/2 NS and then D5W. 07/05 vomiting subsided. CXR showed fluid overload. BNP 300. 07/05  Na+ 150, BUN/creat 55/1.27. Her po intake began to improve per nursing, I/O records."    "07/08 Na+ 150 BUN/creat 42/1.15. Oral intake continues to vary due to dysphagia. Today, she is more alert and able to answer simple questions. Appears oral hydration has improved some."  Today, Na+ has improved to 147> was 150 (07/08). Oral hydration continues to vary. At times she is drinking well with staff, but not consistently. Daughter also comes at dinnertime to help with intake. She is currently on honey thick liquids and pureed foods. Daughter requesting fluids be spoon fed to help with hydration.    Past Medical History:  Diagnosis Date   Actinic keratosis 05/25/2014   Anxiety    Atrial fibrillation (HCC) 09/21/2010   Basal cell carcinoma 05/25/2014   Multiple removed by Dr. Arminda Resides in March 2015: right neck, left neck, scalp    Bell's palsy 07/18/1979   Cervicalgia 01/31/2012   Closed fracture of lumbar vertebra without mention of spinal cord injury 07/17/2005   Conjunctiva disorder 12/26/2010   Coronary atherosclerosis of native coronary artery 07/17/1997   Cramp of limb 08/16/2009   Degeneration of lumbar or lumbosacral intervertebral disc 01/31/2012   Disturbance of skin sensation 08/2009   Dizziness and giddiness 11/08/2011   vertigo   External hemorrhoids without mention of complication 06/27/2010   External hemorrhoids without mention of complication 08/26/2012   Ganglion of tendon sheath 08/16/2009   Leg cramp 10/27/2013   Most frequently the right leg.    Long term (current) use of anticoagulants 09/2010   Lumbago 07/2009   Meralgia paresthetica 07/2007   MI, old    Myalgia and myositis,  unspecified 01/17/2012   Osteoporosis    Other abnormal blood chemistry 04/08/2012   Other abnormal blood chemistry 2013   hyperglycemia   Other disorder of muscle, ligament, and fascia 04/08/2012   Other specified cardiac dysrhythmias(427.89) 06/27/2010   Pacemaker 05/06/2020    Pain in joint, ankle and foot 10/27/2013   Bilateral since 1998    Pain in joint, shoulder region 08/12/2012   Pain in joint, upper arm 12/26/2010   Pain in limb 01/11/2011   Pain in thoracic spine 01/31/2012   Pathologic fracture of vertebrae 01/22/2012   PVC's (premature ventricular contractions)    Rash and other nonspecific skin eruption 08/02/2011   Senile osteoporosis 07/17/1993   Stroke (HCC)    Unspecified essential hypertension 07/17/1997   Varicose veins of lower extremities 08/16/2009   Varicose veins of lower extremities 08/16/2009   Past Surgical History:  Procedure Laterality Date   COLONOSCOPY WITH PROPOFOL N/A 10/19/2015   Procedure: COLONOSCOPY WITH PROPOFOL;  Surgeon: Iva Boop, MD;  Location: [facility];  Service: Endoscopy;  Laterality: N/A;   CORONARY ARTERY BYPASS GRAFT  1998   x2; LIMA to LAD; SVG to diagonal off bypass   HEMORRHOID SURGERY  08/26/2012   Dr. Luisa Hart   LOOP RECORDER INSERTION N/A 05/13/2018   Procedure: LOOP RECORDER INSERTION;  Surgeon: Marinus Maw, MD;  Location: [facility] CV [facility];  Service: Cardiovascular;  Laterality: N/A;   LOOP RECORDER REMOVAL N/A 05/26/2019   Procedure: LOOP RECORDER REMOVAL;  Surgeon: Marinus Maw, MD;  Location: [facility] INVASIVE CV LAB;  Service: Cardiovascular;  Laterality: N/A;   MASS EXCISION Left 11/23/2015   Procedure: LEFT WRIST EXCISION CYST;  Surgeon: Betha Loa, MD;  Location: [facility];  Service: Orthopedics;  Laterality: Left;   PACEMAKER IMPLANT N/A 05/26/2019   Procedure: PACEMAKER IMPLANT;  Surgeon: Marinus Maw, MD;  Location: [facility] INVASIVE CV LAB;  Service: Cardiovascular;  Laterality: N/A;   SKIN BIOPSY  01/29/14   (R) neck; (R) scalp, 2 (L) neck; shave biopsy Superficial basal cell carcinoma Dr. Arminda Resides   TONSILLECTOMY  [phone]    Allergies  Allergen Reactions   Iodinated Contrast Media Nausea Only and Other (See Comments)    Severe nausea and also passed out   Iodine  Nausea Only and Other (See Comments)    "Allergic," per [facility]- PASSED OUT   Lodine [Etodolac] Other (See Comments)    "Allergic," per [facility]   Other Other (See Comments)    Seasonal Allergies   Bactrim Rash   Relafen [Nabumetone] Rash   Sulfamethoxazole-Trimethoprim Rash    In matrix   Sulfanilamide Rash    Allergies as of 05/17/2023       Reactions   Iodinated Contrast Media Nausea Only, Other (See Comments)   Severe nausea and also passed out   Iodine Nausea Only, Other (See Comments)   "Allergic," per [facility]- PASSED OUT   Lodine [etodolac] Other (See Comments)   "Allergic," per [facility]   Other Other (See Comments)   Seasonal Allergies   Bactrim Rash   Relafen [nabumetone] Rash   Sulfamethoxazole-trimethoprim Rash   In matrix   Sulfanilamide Rash        Medication List        Accurate as of May 17, 2023  3:22 PM. If you have any questions, ask your nurse or doctor.          STOP taking these medications    ipratropium 0.03 % nasal spray Commonly known  as: ATROVENT Stopped by: Octavia Heir       TAKE these medications    acetaminophen 325 MG tablet Commonly known as: TYLENOL Take 650 mg by mouth 3 (three) times daily as needed (for pain).   amLODipine 5 MG tablet Commonly known as: NORVASC Take 5 mg by mouth daily.   Aspirin Low Dose 81 MG chewable tablet Generic drug: aspirin Chew 1 tablet (81 mg total) by mouth daily.   atenolol 50 MG tablet Commonly known as: TENORMIN Take 1 tablet (50 mg total) by mouth 2 (two) times daily.   Biotene Dry Mouth Moisturizing Soln Use as directed 1-2 sprays in the mouth or throat 3 (three) times daily.   CALCIUM 500 + D3 PO Take 1 capsule by mouth in the morning.   chlorhexidine 0.12 % solution Commonly known as: Peridex Use as directed 15 mLs in the mouth or throat 2 (two) times daily.   digoxin 0.125 MG tablet Commonly known as: LANOXIN Take 1 tablet (0.125 mg total) by mouth every other day.   docusate 50  MG/5ML liquid Commonly known as: COLACE Take 100 mg by mouth daily.   hydrALAZINE 10 MG tablet Commonly known as: APRESOLINE Take 10 mg by mouth as needed (SBP>180).   ipratropium 17 MCG/ACT inhaler Commonly known as: ATROVENT HFA Inhale 2 puffs into the lungs every morning.   levocetirizine 5 MG tablet Commonly known as: XYZAL Take 5 mg by mouth at bedtime.   multivitamin with iron-minerals liquid Take 15 mLs by mouth daily.   polyethylene glycol 17 g packet Commonly known as: MIRALAX / GLYCOLAX Take 17 g by mouth See admin instructions. Mix 17 grams of powder into 4-6 oz of beverage of choice and drink once a day   Pro-Stat Liqd Take 30 mLs by mouth every morning.   promethazine 12.5 MG suppository Commonly known as: PHENERGAN Place 12.5 mg rectally every 6 (six) hours as needed for nausea or vomiting. 12.5 mg, rectal, Every 6 Hours - PRN, Promethazine suppository 12.5 mg every 6 hours as needed if vomiting or cannot take PO.   sodium chloride 0.65 % nasal spray Commonly known as: OCEAN Place 1 spray into the nose 2 (two) times daily as needed for congestion.   Voltaren 1 % Gel Generic drug: diclofenac Sodium Apply 4 g topically as needed.        Review of Systems  Unable to perform ROS: Other    Immunization History  Administered Date(s) Administered   Influenza Whole 08/06/2012   Influenza, High Dose Seasonal PF 08/15/2019, 08/27/2020   Influenza,inj,Quad PF,6+ Mos 08/30/2018   Influenza-Unspecified 08/06/2013, 08/24/2014, 08/26/2015, 08/31/2016, 08/27/2017, 08/27/2020   Moderna Sars-Covid-2 Vaccination 11/16/2019, 12/16/2019, 09/16/2020   Pneumococcal Conjugate-13 09/22/2015   Pneumococcal Polysaccharide-23 08/29/2006   Td 10/07/2007   Zoster Recombinant(Shingrix) 02/12/2018, 05/13/2018   Zoster, Live 03/12/2008   Pertinent  Health Maintenance Due  Topic Date Due   INFLUENZA VACCINE  06/07/2023   DEXA SCAN  Completed      10/02/2022    8:00 AM  10/03/2022   10:49 AM 10/04/2022    9:50 AM 10/06/2022   10:31 AM 12/01/2022    9:28 AM  Fall Risk  Falls in the past year?  1 1 1 1   Was there an injury with Fall?  1 1 1 1   Fall Risk Category Calculator  3 3 2 2   Fall Risk Category (Retired)  High High Moderate   (RETIRED) Patient Fall Risk Level High fall risk  High fall risk High fall risk High fall risk   Patient at Risk for Falls Due to  History of fall(s) History of fall(s) History of fall(s) History of fall(s)  Fall risk Follow up  Falls evaluation completed Falls evaluation completed Falls evaluation completed Falls evaluation completed   Functional Status Survey:    Vitals:   05/17/23 1515  BP: (!) 145/81  Pulse: 66  Resp: 18  Temp: (!) 97 F (36.1 C)  SpO2: 97%  Weight: 126 lb 12.8 oz (57.5 kg)  Height: 5\' 6"  (1.676 m)   Body mass index is 20.47 kg/m. Physical Exam Vitals reviewed.  Constitutional:      General: She is not in acute distress. HENT:     Head: Normocephalic.  Eyes:     General:        Right eye: No discharge.        Left eye: No discharge.  Cardiovascular:     Rate and Rhythm: Normal rate. Rhythm irregular.     Pulses: Normal pulses.     Heart sounds: Normal heart sounds.  Pulmonary:     Effort: Pulmonary effort is normal. No respiratory distress.     Breath sounds: Normal breath sounds. No wheezing.  Abdominal:     General: Bowel sounds are normal.     Palpations: Abdomen is soft.  Musculoskeletal:     Cervical back: Neck supple.  Skin:    General: Skin is warm.     Capillary Refill: Capillary refill takes less than 2 seconds.  Neurological:     General: No focal deficit present.     Mental Status: She is alert. Mental status is at baseline.     Motor: Weakness present.     Gait: Gait abnormal.  Psychiatric:        Mood and Affect: Mood normal.     Labs reviewed: Recent Labs    09/30/22 0931 10/01/22 0806 10/02/22 0900 10/21/22 0000 05/10/23 0000 05/14/23 0000  05/16/23 0000  NA 141 142 142   < > 150* 150* 147  K 3.6 3.3* 3.9   < > 4.0 4.6 4.5  CL 108 111 116*   < > 116* 114* 111*  CO2 22 20* 19*   < > 26* 23* 18  GLUCOSE 107* 114* 108*  --   --   --   --   BUN 34* 36* 35*   < > 55* 43* 44*  CREATININE 1.25* 1.21* 1.09*   < > 1.3* 1.2* 1.2*  CALCIUM 8.5* 8.2* 8.2*   < > 8.5* 8.6* 9.0   < > = values in this interval not displayed.   Recent Labs    09/26/22 1539 10/21/22 0000 01/02/23 0000 02/02/23 0000 05/07/23 0000  AST 17   < > 21 21 24   ALT 12   < > 21 17 16   ALKPHOS 71   < > 80 83 57  BILITOT 0.3  --   --   --   --   PROT 7.1  --   --   --   --   ALBUMIN 3.1*   < > 3.1* 3.4* 3.3*   < > = values in this interval not displayed.   Recent Labs    09/30/22 0931 10/01/22 0806 10/02/22 0900 10/09/22 0000 02/02/23 0000 02/21/23 0000 05/07/23 0000  WBC 10.7* 11.0* 9.9   < > 9.1 9.3 12.4  NEUTROABS  --   --   --    < >  5.70 5.00 7.90  HGB 9.2* 8.5* 8.8*   < > 9.2* 10.5* 10.6*  HCT 28.9* 25.7* 27.2*   < > 30* 32* 32*  MCV 88.1 87.1 86.6  --   --   --   --   PLT 96* 87* 109*   < > 182 163 148*   < > = values in this interval not displayed.   Lab Results  Component Value Date   TSH 2.87 02/03/2022   Lab Results  Component Value Date   HGBA1C 5.7 (H) 09/26/2022   Lab Results  Component Value Date   CHOL 161 09/27/2022   HDL 39 (L) 09/27/2022   LDLCALC 114 (H) 09/27/2022   LDLDIRECT 81.3 08/19/2020   TRIG 39 09/27/2022   CHOLHDL 4.1 09/27/2022    Significant Diagnostic Results in last 30 days:  No results found.  Assessment/Plan 1. Hypernatremia - Na+ 147 (07/10)> 150 (07/08)> 157 (07/03) - repeat D5W 250 cc bolus at 75 cc/hr once - repeat bmp 07/12 - if labs improved plan to discontinued IV tomorrow - encourage oral hydration>daughter recommending spoon feeding fluids since she is on honey thick liquids> nursing notified  2. Oropharyngeal dysphagia - followed by ST - cont pureed diet with honey thick fluids    3. Acute renal failure, unspecified acute renal failure type (HCC) - BUN/creat 43.9/1.2 (07/10)> was 42.8/1.15 (07/08) - cont oral hydration    Family/ staff Communication: plan discussed with daughter, patient and nurse  Labs/tests ordered:  bmp 07/12

## 2023-05-18 DIAGNOSIS — Z79899 Other long term (current) drug therapy: Secondary | ICD-10-CM | POA: Diagnosis not present

## 2023-05-18 LAB — COMPREHENSIVE METABOLIC PANEL
Calcium: 8.6 — AB (ref 8.7–10.7)
eGFR: 50

## 2023-05-21 DIAGNOSIS — R278 Other lack of coordination: Secondary | ICD-10-CM | POA: Diagnosis not present

## 2023-05-21 DIAGNOSIS — I69391 Dysphagia following cerebral infarction: Secondary | ICD-10-CM | POA: Diagnosis not present

## 2023-05-21 DIAGNOSIS — I69322 Dysarthria following cerebral infarction: Secondary | ICD-10-CM | POA: Diagnosis not present

## 2023-05-21 DIAGNOSIS — I69354 Hemiplegia and hemiparesis following cerebral infarction affecting left non-dominant side: Secondary | ICD-10-CM | POA: Diagnosis not present

## 2023-05-21 DIAGNOSIS — M6389 Disorders of muscle in diseases classified elsewhere, multiple sites: Secondary | ICD-10-CM | POA: Diagnosis not present

## 2023-05-21 DIAGNOSIS — I6521 Occlusion and stenosis of right carotid artery: Secondary | ICD-10-CM | POA: Diagnosis not present

## 2023-05-21 DIAGNOSIS — Z79899 Other long term (current) drug therapy: Secondary | ICD-10-CM | POA: Diagnosis not present

## 2023-05-21 DIAGNOSIS — R1312 Dysphagia, oropharyngeal phase: Secondary | ICD-10-CM | POA: Diagnosis not present

## 2023-05-21 DIAGNOSIS — I6621 Occlusion and stenosis of right posterior cerebral artery: Secondary | ICD-10-CM | POA: Diagnosis not present

## 2023-05-21 LAB — CBC AND DIFFERENTIAL
HCT: 29 — AB (ref 36–46)
Hemoglobin: 8.9 — AB (ref 12.0–16.0)
Platelets: 124 10*3/uL — AB (ref 150–400)
WBC: 14.4

## 2023-05-21 LAB — CBC: RBC: 3.29 — AB (ref 3.87–5.11)

## 2023-05-22 ENCOUNTER — Encounter: Payer: Self-pay | Admitting: Orthopedic Surgery

## 2023-05-22 ENCOUNTER — Non-Acute Institutional Stay (SKILLED_NURSING_FACILITY): Payer: Medicare Other | Admitting: Orthopedic Surgery

## 2023-05-22 DIAGNOSIS — M6389 Disorders of muscle in diseases classified elsewhere, multiple sites: Secondary | ICD-10-CM | POA: Diagnosis not present

## 2023-05-22 DIAGNOSIS — N179 Acute kidney failure, unspecified: Secondary | ICD-10-CM | POA: Diagnosis not present

## 2023-05-22 DIAGNOSIS — Z950 Presence of cardiac pacemaker: Secondary | ICD-10-CM | POA: Diagnosis not present

## 2023-05-22 DIAGNOSIS — J9811 Atelectasis: Secondary | ICD-10-CM | POA: Diagnosis not present

## 2023-05-22 DIAGNOSIS — I6521 Occlusion and stenosis of right carotid artery: Secondary | ICD-10-CM | POA: Diagnosis not present

## 2023-05-22 DIAGNOSIS — I69322 Dysarthria following cerebral infarction: Secondary | ICD-10-CM | POA: Diagnosis not present

## 2023-05-22 DIAGNOSIS — I69391 Dysphagia following cerebral infarction: Secondary | ICD-10-CM | POA: Diagnosis not present

## 2023-05-22 DIAGNOSIS — E870 Hyperosmolality and hypernatremia: Secondary | ICD-10-CM | POA: Diagnosis not present

## 2023-05-22 DIAGNOSIS — I69354 Hemiplegia and hemiparesis following cerebral infarction affecting left non-dominant side: Secondary | ICD-10-CM | POA: Diagnosis not present

## 2023-05-22 DIAGNOSIS — R1312 Dysphagia, oropharyngeal phase: Secondary | ICD-10-CM

## 2023-05-22 DIAGNOSIS — I6621 Occlusion and stenosis of right posterior cerebral artery: Secondary | ICD-10-CM | POA: Diagnosis not present

## 2023-05-22 DIAGNOSIS — D696 Thrombocytopenia, unspecified: Secondary | ICD-10-CM

## 2023-05-22 DIAGNOSIS — D72829 Elevated white blood cell count, unspecified: Secondary | ICD-10-CM | POA: Diagnosis not present

## 2023-05-22 DIAGNOSIS — R278 Other lack of coordination: Secondary | ICD-10-CM | POA: Diagnosis not present

## 2023-05-22 NOTE — Progress Notes (Unsigned)
Location:  [facility] Room Number: 118-A Place of Service:  SNF [phone]) Provider:  Hazle Nordmann, NP   Patient Care Team: Mahlon Gammon, MD as PCP - General (Internal Medicine) Marinus Maw, MD as PCP - Electrophysiology (Cardiology) Mathews Robinsons, MD as Consulting Physician (Dermatology) Marinus Maw, MD as Consulting Physician (Cardiology) [facility] Jeanella Craze, MD as Consulting Physician (Dermatology)  Extended Emergency Contact Information Primary Emergency Contact: Lynnell Grain of [place] Home Phone: [phone] Mobile Phone: [phone] Relation: Daughter Secondary Emergency Contact: Claud Kelp of [place] Home Phone: [phone] Mobile Phone: [phone] Relation: Son  Code Status:  DNR Goals of care: Advanced Directive information    05/22/2023    3:16 PM  Advanced Directives  Does Patient Have a Medical Advance Directive? Yes  Type of Estate agent of [place];Living will;Out of facility DNR (pink MOST or yellow form)  Does patient want to make changes to medical advance directive? No - Patient declined  Copy of Healthcare Power of Attorney in Chart? Yes - validated most recent copy scanned in chart (See row information)  Pre-existing out of facility DNR order (yellow form or pink MOST form) Pink MOST form placed in chart (order not valid for inpatient use);Yellow form placed in chart (order not valid for inpatient use)     Chief Complaint  Patient presents with   Acute Visit    elevated WBC    HPI:  Pt is a 89 y.o. female seen today for an acute visit left leg pain.      Past Medical History:  Diagnosis Date   Actinic keratosis 05/25/2014   Anxiety    Atrial fibrillation (HCC) 09/21/2010   Basal cell carcinoma 05/25/2014   Multiple removed by Dr. Arminda Resides in March 2015: right neck, left neck, scalp    Bell's palsy 07/18/1979    Cervicalgia 01/31/2012   Closed fracture of lumbar vertebra without mention of spinal cord injury 07/17/2005   Conjunctiva disorder 12/26/2010   Coronary atherosclerosis of native coronary artery 07/17/1997   Cramp of limb 08/16/2009   Degeneration of lumbar or lumbosacral intervertebral disc 01/31/2012   Disturbance of skin sensation 08/2009   Dizziness and giddiness 11/08/2011   vertigo   External hemorrhoids without mention of complication 06/27/2010   External hemorrhoids without mention of complication 08/26/2012   Ganglion of tendon sheath 08/16/2009   Leg cramp 10/27/2013   Most frequently the right leg.    Long term (current) use of anticoagulants 09/2010   Lumbago 07/2009   Meralgia paresthetica 07/2007   MI, old    Myalgia and myositis, unspecified 01/17/2012   Osteoporosis    Other abnormal blood chemistry 04/08/2012   Other abnormal blood chemistry 2013   hyperglycemia   Other disorder of muscle, ligament, and fascia 04/08/2012   Other specified cardiac dysrhythmias(427.89) 06/27/2010   Pacemaker 05/06/2020   Pain in joint, ankle and foot 10/27/2013   Bilateral since 1998    Pain in joint, shoulder region 08/12/2012   Pain in joint, upper arm 12/26/2010   Pain in limb 01/11/2011   Pain in thoracic spine 01/31/2012   Pathologic fracture of vertebrae 01/22/2012   PVC's (premature ventricular contractions)    Rash and other nonspecific skin eruption 08/02/2011   Senile osteoporosis 07/17/1993   Stroke (HCC)    Unspecified essential hypertension 07/17/1997   Varicose veins of lower extremities 08/16/2009   Varicose veins of lower extremities 08/16/2009  Past Surgical History:  Procedure Laterality Date   COLONOSCOPY WITH PROPOFOL N/A 10/19/2015   Procedure: COLONOSCOPY WITH PROPOFOL;  Surgeon: Iva Boop, MD;  Location: [facility];  Service: Endoscopy;  Laterality: N/A;   CORONARY ARTERY BYPASS GRAFT  1998   x2; LIMA to LAD; SVG to diagonal off bypass    HEMORRHOID SURGERY  08/26/2012   Dr. Luisa Hart   LOOP RECORDER INSERTION N/A 05/13/2018   Procedure: LOOP RECORDER INSERTION;  Surgeon: Marinus Maw, MD;  Location: [facility] INVASIVE CV LAB;  Service: Cardiovascular;  Laterality: N/A;   LOOP RECORDER REMOVAL N/A 05/26/2019   Procedure: LOOP RECORDER REMOVAL;  Surgeon: Marinus Maw, MD;  Location: [facility] INVASIVE CV LAB;  Service: Cardiovascular;  Laterality: N/A;   MASS EXCISION Left 11/23/2015   Procedure: LEFT WRIST EXCISION CYST;  Surgeon: Betha Loa, MD;  Location: [facility];  Service: Orthopedics;  Laterality: Left;   PACEMAKER IMPLANT N/A 05/26/2019   Procedure: PACEMAKER IMPLANT;  Surgeon: Marinus Maw, MD;  Location: [facility] INVASIVE CV LAB;  Service: Cardiovascular;  Laterality: N/A;   SKIN BIOPSY  01/29/14   (R) neck; (R) scalp, 2 (L) neck; shave biopsy Superficial basal cell carcinoma Dr. Arminda Resides   TONSILLECTOMY  [phone]    Allergies  Allergen Reactions   Iodinated Contrast Media Nausea Only and Other (See Comments)    Severe nausea and also passed out   Iodine Nausea Only and Other (See Comments)    "Allergic," per [facility]- PASSED OUT   Lodine [Etodolac] Other (See Comments)    "Allergic," per [facility]   Other Other (See Comments)    Seasonal Allergies   Bactrim Rash   Relafen [Nabumetone] Rash   Sulfamethoxazole-Trimethoprim Rash    In matrix   Sulfanilamide Rash    Outpatient Encounter Medications as of 05/22/2023  Medication Sig   acetaminophen (TYLENOL) 325 MG tablet Take 650 mg by mouth 3 (three) times daily as needed (for pain).   Amino Acids-Protein Hydrolys (PRO-STAT) LIQD Take 30 mLs by mouth every morning.   amLODipine (NORVASC) 5 MG tablet Take 5 mg by mouth daily.   Artificial Saliva (BIOTENE DRY MOUTH MOISTURIZING) SOLN Use as directed 1-2 sprays in the mouth or throat 3 (three) times daily.   aspirin 81 MG chewable tablet Chew 1 tablet (81 mg total) by mouth daily.   atenolol (TENORMIN) 50 MG tablet Take  1 tablet (50 mg total) by mouth 2 (two) times daily.   Calcium Carb-Cholecalciferol (CALCIUM 500 + D3 PO) Take 1 capsule by mouth in the morning.   chlorhexidine (PERIDEX) 0.12 % solution Use as directed 15 mLs in the mouth or throat 2 (two) times daily.   diclofenac Sodium (VOLTAREN) 1 % GEL Apply 4 g topically as needed.   digoxin (LANOXIN) 0.125 MG tablet Take 1 tablet (0.125 mg total) by mouth every other day.   docusate (COLACE) 50 MG/5ML liquid Take 100 mg by mouth daily.   hydrALAZINE (APRESOLINE) 10 MG tablet Take 10 mg by mouth as needed (SBP>180).   ipratropium (ATROVENT HFA) 17 MCG/ACT inhaler Inhale 2 puffs into the lungs every morning.   levocetirizine (XYZAL) 5 MG tablet Take 5 mg by mouth at bedtime.   Multiple Vitamins-Minerals (MULTIVITAMIN WITH IRON-MINERALS) liquid Take 15 mLs by mouth daily.   polyethylene glycol (MIRALAX / GLYCOLAX) 17 g packet Take 17 g by mouth See admin instructions. Mix 17 grams of powder into 4-6 oz of beverage of choice and drink once a  day   promethazine (PHENERGAN) 12.5 MG suppository Place 12.5 mg rectally every 6 (six) hours as needed for nausea or vomiting. 12.5 mg, rectal, Every 6 Hours - PRN, Promethazine suppository 12.5 mg every 6 hours as needed if vomiting or cannot take PO.   sodium chloride (OCEAN) 0.65 % nasal spray Place 1 spray into the nose 2 (two) times daily as needed for congestion.   No facility-administered encounter medications on file as of 05/22/2023.    Review of Systems  Immunization History  Administered Date(s) Administered   Influenza Whole 08/06/2012   Influenza, High Dose Seasonal PF 08/15/2019, 08/27/2020   Influenza,inj,Quad PF,6+ Mos 08/30/2018   Influenza-Unspecified 08/06/2013, 08/24/2014, 08/26/2015, 08/31/2016, 08/27/2017, 08/27/2020   Moderna Sars-Covid-2 Vaccination 11/16/2019, 12/16/2019, 09/16/2020   Pneumococcal Conjugate-13 09/22/2015   Pneumococcal Polysaccharide-23 08/29/2006   Td 10/07/2007    Zoster Recombinant(Shingrix) 02/12/2018, 05/13/2018   Zoster, Live 03/12/2008   Pertinent  Health Maintenance Due  Topic Date Due   INFLUENZA VACCINE  06/07/2023   DEXA SCAN  Completed      10/02/2022    8:00 AM 10/03/2022   10:49 AM 10/04/2022    9:50 AM 10/06/2022   10:31 AM 12/01/2022    9:28 AM  Fall Risk  Falls in the past year?  1 1 1 1   Was there an injury with Fall?  1 1 1 1   Fall Risk Category Calculator  3 3 2 2   Fall Risk Category (Retired)  High High Moderate   (RETIRED) Patient Fall Risk Level High fall risk High fall risk High fall risk High fall risk   Patient at Risk for Falls Due to  History of fall(s) History of fall(s) History of fall(s) History of fall(s)  Fall risk Follow up  Falls evaluation completed Falls evaluation completed Falls evaluation completed Falls evaluation completed   Functional Status Survey:    Vitals:   05/22/23 1511  BP: 134/79  Pulse: 77  Resp: 17  Temp: 98.1 F (36.7 C)  SpO2: 95%  Weight: 126 lb 12.8 oz (57.5 kg)  Height: 5\' 6"  (1.676 m)   Body mass index is 20.47 kg/m. Physical Exam  Labs reviewed: Recent Labs    09/30/22 0931 10/01/22 0806 10/02/22 0900 10/21/22 0000 05/10/23 0000 05/14/23 0000 05/16/23 0000 05/18/23 0000  NA 141 142 142   < > 150* 150* 147  --   K 3.6 3.3* 3.9   < > 4.0 4.6 4.5  --   CL 108 111 116*   < > 116* 114* 111*  --   CO2 22 20* 19*   < > 26* 23* 18  --   GLUCOSE 107* 114* 108*  --   --   --   --   --   BUN 34* 36* 35*   < > 55* 43* 44*  --   CREATININE 1.25* 1.21* 1.09*   < > 1.3* 1.2* 1.2*  --   CALCIUM 8.5* 8.2* 8.2*   < > 8.5* 8.6* 9.0 8.6*   < > = values in this interval not displayed.   Recent Labs    09/26/22 1539 10/21/22 0000 01/02/23 0000 02/02/23 0000 05/07/23 0000  AST 17   < > 21 21 24   ALT 12   < > 21 17 16   ALKPHOS 71   < > 80 83 57  BILITOT 0.3  --   --   --   --   PROT 7.1  --   --   --   --  ALBUMIN 3.1*   < > 3.1* 3.4* 3.3*   < > = values in this  interval not displayed.   Recent Labs    09/30/22 0931 10/01/22 0806 10/02/22 0900 10/09/22 0000 02/02/23 0000 02/21/23 0000 05/07/23 0000 05/21/23 0000  WBC 10.7* 11.0* 9.9   < > 9.1 9.3 12.4 14.4  NEUTROABS  --   --   --    < > 5.70 5.00 7.90  --   HGB 9.2* 8.5* 8.8*   < > 9.2* 10.5* 10.6* 8.9*  HCT 28.9* 25.7* 27.2*   < > 30* 32* 32* 29*  MCV 88.1 87.1 86.6  --   --   --   --   --   PLT 96* 87* 109*   < > 182 163 148* 124*   < > = values in this interval not displayed.   Lab Results  Component Value Date   TSH 2.87 02/03/2022   Lab Results  Component Value Date   HGBA1C 5.7 (H) 09/26/2022   Lab Results  Component Value Date   CHOL 161 09/27/2022   HDL 39 (L) 09/27/2022   LDLCALC 114 (H) 09/27/2022   LDLDIRECT 81.3 08/19/2020   TRIG 39 09/27/2022   CHOLHDL 4.1 09/27/2022    Significant Diagnostic Results in last 30 days:  No results found.  Assessment/Plan There are no diagnoses linked to this encounter.   Family/ staff Communication: ***  Labs/tests ordered:  ***

## 2023-05-23 DIAGNOSIS — Z79899 Other long term (current) drug therapy: Secondary | ICD-10-CM | POA: Diagnosis not present

## 2023-05-23 DIAGNOSIS — R4182 Altered mental status, unspecified: Secondary | ICD-10-CM | POA: Diagnosis not present

## 2023-05-24 DIAGNOSIS — I69354 Hemiplegia and hemiparesis following cerebral infarction affecting left non-dominant side: Secondary | ICD-10-CM | POA: Diagnosis not present

## 2023-05-24 DIAGNOSIS — R278 Other lack of coordination: Secondary | ICD-10-CM | POA: Diagnosis not present

## 2023-05-24 DIAGNOSIS — R1312 Dysphagia, oropharyngeal phase: Secondary | ICD-10-CM | POA: Diagnosis not present

## 2023-05-24 DIAGNOSIS — I69391 Dysphagia following cerebral infarction: Secondary | ICD-10-CM | POA: Diagnosis not present

## 2023-05-24 DIAGNOSIS — I69322 Dysarthria following cerebral infarction: Secondary | ICD-10-CM | POA: Diagnosis not present

## 2023-05-24 DIAGNOSIS — E870 Hyperosmolality and hypernatremia: Secondary | ICD-10-CM | POA: Diagnosis not present

## 2023-05-24 DIAGNOSIS — M6389 Disorders of muscle in diseases classified elsewhere, multiple sites: Secondary | ICD-10-CM | POA: Diagnosis not present

## 2023-05-24 DIAGNOSIS — I6521 Occlusion and stenosis of right carotid artery: Secondary | ICD-10-CM | POA: Diagnosis not present

## 2023-05-24 DIAGNOSIS — I6621 Occlusion and stenosis of right posterior cerebral artery: Secondary | ICD-10-CM | POA: Diagnosis not present

## 2023-05-28 DIAGNOSIS — I69391 Dysphagia following cerebral infarction: Secondary | ICD-10-CM | POA: Diagnosis not present

## 2023-05-28 DIAGNOSIS — I69322 Dysarthria following cerebral infarction: Secondary | ICD-10-CM | POA: Diagnosis not present

## 2023-05-28 DIAGNOSIS — R1312 Dysphagia, oropharyngeal phase: Secondary | ICD-10-CM | POA: Diagnosis not present

## 2023-05-29 DIAGNOSIS — I69391 Dysphagia following cerebral infarction: Secondary | ICD-10-CM | POA: Diagnosis not present

## 2023-05-29 DIAGNOSIS — I69322 Dysarthria following cerebral infarction: Secondary | ICD-10-CM | POA: Diagnosis not present

## 2023-05-29 DIAGNOSIS — R1312 Dysphagia, oropharyngeal phase: Secondary | ICD-10-CM | POA: Diagnosis not present

## 2023-05-30 DIAGNOSIS — R278 Other lack of coordination: Secondary | ICD-10-CM | POA: Diagnosis not present

## 2023-05-30 DIAGNOSIS — M6389 Disorders of muscle in diseases classified elsewhere, multiple sites: Secondary | ICD-10-CM | POA: Diagnosis not present

## 2023-05-30 DIAGNOSIS — I69354 Hemiplegia and hemiparesis following cerebral infarction affecting left non-dominant side: Secondary | ICD-10-CM | POA: Diagnosis not present

## 2023-05-30 DIAGNOSIS — I6621 Occlusion and stenosis of right posterior cerebral artery: Secondary | ICD-10-CM | POA: Diagnosis not present

## 2023-05-30 DIAGNOSIS — I6521 Occlusion and stenosis of right carotid artery: Secondary | ICD-10-CM | POA: Diagnosis not present

## 2023-05-31 DIAGNOSIS — R1312 Dysphagia, oropharyngeal phase: Secondary | ICD-10-CM | POA: Diagnosis not present

## 2023-05-31 DIAGNOSIS — I69322 Dysarthria following cerebral infarction: Secondary | ICD-10-CM | POA: Diagnosis not present

## 2023-05-31 DIAGNOSIS — I69391 Dysphagia following cerebral infarction: Secondary | ICD-10-CM | POA: Diagnosis not present

## 2023-06-01 ENCOUNTER — Ambulatory Visit: Payer: Medicare Other

## 2023-06-01 DIAGNOSIS — I472 Ventricular tachycardia, unspecified: Secondary | ICD-10-CM

## 2023-06-01 LAB — CUP PACEART REMOTE DEVICE CHECK
Battery Remaining Longevity: 103 mo
Battery Remaining Percentage: 76 %
Battery Voltage: 3.01 V
Brady Statistic RV Percent Paced: 9.6 %
Date Time Interrogation Session: 20240726020013
Implantable Lead Connection Status: 753985
Implantable Lead Implant Date: 20200720
Implantable Lead Location: 753860
Implantable Pulse Generator Implant Date: 20200720
Lead Channel Impedance Value: 490 Ohm
Lead Channel Pacing Threshold Amplitude: 1 V
Lead Channel Pacing Threshold Pulse Width: 0.5 ms
Lead Channel Sensing Intrinsic Amplitude: 5.2 mV
Lead Channel Setting Pacing Amplitude: 2.5 V
Lead Channel Setting Pacing Pulse Width: 0.5 ms
Lead Channel Setting Sensing Sensitivity: 0.5 mV
Pulse Gen Model: 1272
Pulse Gen Serial Number: 9122450

## 2023-06-04 DIAGNOSIS — I6521 Occlusion and stenosis of right carotid artery: Secondary | ICD-10-CM | POA: Diagnosis not present

## 2023-06-04 DIAGNOSIS — R278 Other lack of coordination: Secondary | ICD-10-CM | POA: Diagnosis not present

## 2023-06-04 DIAGNOSIS — I69391 Dysphagia following cerebral infarction: Secondary | ICD-10-CM | POA: Diagnosis not present

## 2023-06-04 DIAGNOSIS — I69322 Dysarthria following cerebral infarction: Secondary | ICD-10-CM | POA: Diagnosis not present

## 2023-06-04 DIAGNOSIS — M6389 Disorders of muscle in diseases classified elsewhere, multiple sites: Secondary | ICD-10-CM | POA: Diagnosis not present

## 2023-06-04 DIAGNOSIS — R1312 Dysphagia, oropharyngeal phase: Secondary | ICD-10-CM | POA: Diagnosis not present

## 2023-06-04 DIAGNOSIS — I69354 Hemiplegia and hemiparesis following cerebral infarction affecting left non-dominant side: Secondary | ICD-10-CM | POA: Diagnosis not present

## 2023-06-04 DIAGNOSIS — I6621 Occlusion and stenosis of right posterior cerebral artery: Secondary | ICD-10-CM | POA: Diagnosis not present

## 2023-06-05 DIAGNOSIS — I69322 Dysarthria following cerebral infarction: Secondary | ICD-10-CM | POA: Diagnosis not present

## 2023-06-05 DIAGNOSIS — R1312 Dysphagia, oropharyngeal phase: Secondary | ICD-10-CM | POA: Diagnosis not present

## 2023-06-05 DIAGNOSIS — I69391 Dysphagia following cerebral infarction: Secondary | ICD-10-CM | POA: Diagnosis not present

## 2023-06-07 DIAGNOSIS — I69391 Dysphagia following cerebral infarction: Secondary | ICD-10-CM | POA: Diagnosis not present

## 2023-06-07 DIAGNOSIS — E870 Hyperosmolality and hypernatremia: Secondary | ICD-10-CM | POA: Diagnosis not present

## 2023-06-07 DIAGNOSIS — R1312 Dysphagia, oropharyngeal phase: Secondary | ICD-10-CM | POA: Diagnosis not present

## 2023-06-07 DIAGNOSIS — I69322 Dysarthria following cerebral infarction: Secondary | ICD-10-CM | POA: Diagnosis not present

## 2023-06-08 DIAGNOSIS — R278 Other lack of coordination: Secondary | ICD-10-CM | POA: Diagnosis not present

## 2023-06-08 DIAGNOSIS — I6621 Occlusion and stenosis of right posterior cerebral artery: Secondary | ICD-10-CM | POA: Diagnosis not present

## 2023-06-08 DIAGNOSIS — I6521 Occlusion and stenosis of right carotid artery: Secondary | ICD-10-CM | POA: Diagnosis not present

## 2023-06-08 DIAGNOSIS — I69354 Hemiplegia and hemiparesis following cerebral infarction affecting left non-dominant side: Secondary | ICD-10-CM | POA: Diagnosis not present

## 2023-06-08 DIAGNOSIS — M6389 Disorders of muscle in diseases classified elsewhere, multiple sites: Secondary | ICD-10-CM | POA: Diagnosis not present

## 2023-06-08 NOTE — Progress Notes (Signed)
Remote pacemaker transmission.   

## 2023-06-11 DIAGNOSIS — M6389 Disorders of muscle in diseases classified elsewhere, multiple sites: Secondary | ICD-10-CM | POA: Diagnosis not present

## 2023-06-11 DIAGNOSIS — I6521 Occlusion and stenosis of right carotid artery: Secondary | ICD-10-CM | POA: Diagnosis not present

## 2023-06-11 DIAGNOSIS — R1312 Dysphagia, oropharyngeal phase: Secondary | ICD-10-CM | POA: Diagnosis not present

## 2023-06-11 DIAGNOSIS — I69354 Hemiplegia and hemiparesis following cerebral infarction affecting left non-dominant side: Secondary | ICD-10-CM | POA: Diagnosis not present

## 2023-06-11 DIAGNOSIS — I69322 Dysarthria following cerebral infarction: Secondary | ICD-10-CM | POA: Diagnosis not present

## 2023-06-11 DIAGNOSIS — I69391 Dysphagia following cerebral infarction: Secondary | ICD-10-CM | POA: Diagnosis not present

## 2023-06-11 DIAGNOSIS — I6621 Occlusion and stenosis of right posterior cerebral artery: Secondary | ICD-10-CM | POA: Diagnosis not present

## 2023-06-11 DIAGNOSIS — R278 Other lack of coordination: Secondary | ICD-10-CM | POA: Diagnosis not present

## 2023-06-12 DIAGNOSIS — I69391 Dysphagia following cerebral infarction: Secondary | ICD-10-CM | POA: Diagnosis not present

## 2023-06-12 DIAGNOSIS — R1312 Dysphagia, oropharyngeal phase: Secondary | ICD-10-CM | POA: Diagnosis not present

## 2023-06-12 DIAGNOSIS — I69322 Dysarthria following cerebral infarction: Secondary | ICD-10-CM | POA: Diagnosis not present

## 2023-06-13 DIAGNOSIS — M6389 Disorders of muscle in diseases classified elsewhere, multiple sites: Secondary | ICD-10-CM | POA: Diagnosis not present

## 2023-06-13 DIAGNOSIS — I6521 Occlusion and stenosis of right carotid artery: Secondary | ICD-10-CM | POA: Diagnosis not present

## 2023-06-13 DIAGNOSIS — R278 Other lack of coordination: Secondary | ICD-10-CM | POA: Diagnosis not present

## 2023-06-13 DIAGNOSIS — I69354 Hemiplegia and hemiparesis following cerebral infarction affecting left non-dominant side: Secondary | ICD-10-CM | POA: Diagnosis not present

## 2023-06-13 DIAGNOSIS — I6621 Occlusion and stenosis of right posterior cerebral artery: Secondary | ICD-10-CM | POA: Diagnosis not present

## 2023-06-14 DIAGNOSIS — R0981 Nasal congestion: Secondary | ICD-10-CM | POA: Diagnosis not present

## 2023-06-18 DIAGNOSIS — R1312 Dysphagia, oropharyngeal phase: Secondary | ICD-10-CM | POA: Diagnosis not present

## 2023-06-18 DIAGNOSIS — I69322 Dysarthria following cerebral infarction: Secondary | ICD-10-CM | POA: Diagnosis not present

## 2023-06-18 DIAGNOSIS — I69391 Dysphagia following cerebral infarction: Secondary | ICD-10-CM | POA: Diagnosis not present

## 2023-06-19 DIAGNOSIS — I69322 Dysarthria following cerebral infarction: Secondary | ICD-10-CM | POA: Diagnosis not present

## 2023-06-19 DIAGNOSIS — M79672 Pain in left foot: Secondary | ICD-10-CM | POA: Diagnosis not present

## 2023-06-19 DIAGNOSIS — I69391 Dysphagia following cerebral infarction: Secondary | ICD-10-CM | POA: Diagnosis not present

## 2023-06-19 DIAGNOSIS — B351 Tinea unguium: Secondary | ICD-10-CM | POA: Diagnosis not present

## 2023-06-19 DIAGNOSIS — M79671 Pain in right foot: Secondary | ICD-10-CM | POA: Diagnosis not present

## 2023-06-19 DIAGNOSIS — R1312 Dysphagia, oropharyngeal phase: Secondary | ICD-10-CM | POA: Diagnosis not present

## 2023-06-20 DIAGNOSIS — R278 Other lack of coordination: Secondary | ICD-10-CM | POA: Diagnosis not present

## 2023-06-20 DIAGNOSIS — I69354 Hemiplegia and hemiparesis following cerebral infarction affecting left non-dominant side: Secondary | ICD-10-CM | POA: Diagnosis not present

## 2023-06-20 DIAGNOSIS — M6389 Disorders of muscle in diseases classified elsewhere, multiple sites: Secondary | ICD-10-CM | POA: Diagnosis not present

## 2023-06-20 DIAGNOSIS — I6621 Occlusion and stenosis of right posterior cerebral artery: Secondary | ICD-10-CM | POA: Diagnosis not present

## 2023-06-20 DIAGNOSIS — I6521 Occlusion and stenosis of right carotid artery: Secondary | ICD-10-CM | POA: Diagnosis not present

## 2023-06-21 DIAGNOSIS — I69322 Dysarthria following cerebral infarction: Secondary | ICD-10-CM | POA: Diagnosis not present

## 2023-06-21 DIAGNOSIS — R1312 Dysphagia, oropharyngeal phase: Secondary | ICD-10-CM | POA: Diagnosis not present

## 2023-06-21 DIAGNOSIS — I69391 Dysphagia following cerebral infarction: Secondary | ICD-10-CM | POA: Diagnosis not present

## 2023-06-25 DIAGNOSIS — I69322 Dysarthria following cerebral infarction: Secondary | ICD-10-CM | POA: Diagnosis not present

## 2023-06-25 DIAGNOSIS — I69354 Hemiplegia and hemiparesis following cerebral infarction affecting left non-dominant side: Secondary | ICD-10-CM | POA: Diagnosis not present

## 2023-06-25 DIAGNOSIS — I69391 Dysphagia following cerebral infarction: Secondary | ICD-10-CM | POA: Diagnosis not present

## 2023-06-25 DIAGNOSIS — M6389 Disorders of muscle in diseases classified elsewhere, multiple sites: Secondary | ICD-10-CM | POA: Diagnosis not present

## 2023-06-25 DIAGNOSIS — R1312 Dysphagia, oropharyngeal phase: Secondary | ICD-10-CM | POA: Diagnosis not present

## 2023-06-25 DIAGNOSIS — I6521 Occlusion and stenosis of right carotid artery: Secondary | ICD-10-CM | POA: Diagnosis not present

## 2023-06-25 DIAGNOSIS — R278 Other lack of coordination: Secondary | ICD-10-CM | POA: Diagnosis not present

## 2023-06-25 DIAGNOSIS — I6621 Occlusion and stenosis of right posterior cerebral artery: Secondary | ICD-10-CM | POA: Diagnosis not present

## 2023-06-26 DIAGNOSIS — I69322 Dysarthria following cerebral infarction: Secondary | ICD-10-CM | POA: Diagnosis not present

## 2023-06-26 DIAGNOSIS — R1312 Dysphagia, oropharyngeal phase: Secondary | ICD-10-CM | POA: Diagnosis not present

## 2023-06-26 DIAGNOSIS — I69391 Dysphagia following cerebral infarction: Secondary | ICD-10-CM | POA: Diagnosis not present

## 2023-06-27 DIAGNOSIS — I6521 Occlusion and stenosis of right carotid artery: Secondary | ICD-10-CM | POA: Diagnosis not present

## 2023-06-27 DIAGNOSIS — I69354 Hemiplegia and hemiparesis following cerebral infarction affecting left non-dominant side: Secondary | ICD-10-CM | POA: Diagnosis not present

## 2023-06-27 DIAGNOSIS — R278 Other lack of coordination: Secondary | ICD-10-CM | POA: Diagnosis not present

## 2023-06-27 DIAGNOSIS — M6389 Disorders of muscle in diseases classified elsewhere, multiple sites: Secondary | ICD-10-CM | POA: Diagnosis not present

## 2023-06-27 DIAGNOSIS — I6621 Occlusion and stenosis of right posterior cerebral artery: Secondary | ICD-10-CM | POA: Diagnosis not present

## 2023-06-28 DIAGNOSIS — I69391 Dysphagia following cerebral infarction: Secondary | ICD-10-CM | POA: Diagnosis not present

## 2023-06-28 DIAGNOSIS — R1312 Dysphagia, oropharyngeal phase: Secondary | ICD-10-CM | POA: Diagnosis not present

## 2023-06-28 DIAGNOSIS — I69322 Dysarthria following cerebral infarction: Secondary | ICD-10-CM | POA: Diagnosis not present

## 2023-06-29 DIAGNOSIS — M6389 Disorders of muscle in diseases classified elsewhere, multiple sites: Secondary | ICD-10-CM | POA: Diagnosis not present

## 2023-06-29 DIAGNOSIS — R278 Other lack of coordination: Secondary | ICD-10-CM | POA: Diagnosis not present

## 2023-06-29 DIAGNOSIS — I6621 Occlusion and stenosis of right posterior cerebral artery: Secondary | ICD-10-CM | POA: Diagnosis not present

## 2023-06-29 DIAGNOSIS — I6521 Occlusion and stenosis of right carotid artery: Secondary | ICD-10-CM | POA: Diagnosis not present

## 2023-06-29 DIAGNOSIS — I69354 Hemiplegia and hemiparesis following cerebral infarction affecting left non-dominant side: Secondary | ICD-10-CM | POA: Diagnosis not present

## 2023-07-02 DIAGNOSIS — I69322 Dysarthria following cerebral infarction: Secondary | ICD-10-CM | POA: Diagnosis not present

## 2023-07-02 DIAGNOSIS — R1312 Dysphagia, oropharyngeal phase: Secondary | ICD-10-CM | POA: Diagnosis not present

## 2023-07-02 DIAGNOSIS — I69391 Dysphagia following cerebral infarction: Secondary | ICD-10-CM | POA: Diagnosis not present

## 2023-07-03 DIAGNOSIS — I69391 Dysphagia following cerebral infarction: Secondary | ICD-10-CM | POA: Diagnosis not present

## 2023-07-03 DIAGNOSIS — I69322 Dysarthria following cerebral infarction: Secondary | ICD-10-CM | POA: Diagnosis not present

## 2023-07-03 DIAGNOSIS — R1312 Dysphagia, oropharyngeal phase: Secondary | ICD-10-CM | POA: Diagnosis not present

## 2023-07-04 DIAGNOSIS — I6521 Occlusion and stenosis of right carotid artery: Secondary | ICD-10-CM | POA: Diagnosis not present

## 2023-07-04 DIAGNOSIS — I6621 Occlusion and stenosis of right posterior cerebral artery: Secondary | ICD-10-CM | POA: Diagnosis not present

## 2023-07-04 DIAGNOSIS — R278 Other lack of coordination: Secondary | ICD-10-CM | POA: Diagnosis not present

## 2023-07-04 DIAGNOSIS — M6389 Disorders of muscle in diseases classified elsewhere, multiple sites: Secondary | ICD-10-CM | POA: Diagnosis not present

## 2023-07-04 DIAGNOSIS — I69354 Hemiplegia and hemiparesis following cerebral infarction affecting left non-dominant side: Secondary | ICD-10-CM | POA: Diagnosis not present

## 2023-07-05 DIAGNOSIS — I69322 Dysarthria following cerebral infarction: Secondary | ICD-10-CM | POA: Diagnosis not present

## 2023-07-05 DIAGNOSIS — R1312 Dysphagia, oropharyngeal phase: Secondary | ICD-10-CM | POA: Diagnosis not present

## 2023-07-05 DIAGNOSIS — I69391 Dysphagia following cerebral infarction: Secondary | ICD-10-CM | POA: Diagnosis not present

## 2023-07-09 DIAGNOSIS — I6521 Occlusion and stenosis of right carotid artery: Secondary | ICD-10-CM | POA: Diagnosis not present

## 2023-07-09 DIAGNOSIS — I6621 Occlusion and stenosis of right posterior cerebral artery: Secondary | ICD-10-CM | POA: Diagnosis not present

## 2023-07-09 DIAGNOSIS — I69322 Dysarthria following cerebral infarction: Secondary | ICD-10-CM | POA: Diagnosis not present

## 2023-07-09 DIAGNOSIS — R278 Other lack of coordination: Secondary | ICD-10-CM | POA: Diagnosis not present

## 2023-07-09 DIAGNOSIS — I69354 Hemiplegia and hemiparesis following cerebral infarction affecting left non-dominant side: Secondary | ICD-10-CM | POA: Diagnosis not present

## 2023-07-09 DIAGNOSIS — I69391 Dysphagia following cerebral infarction: Secondary | ICD-10-CM | POA: Diagnosis not present

## 2023-07-09 DIAGNOSIS — R1312 Dysphagia, oropharyngeal phase: Secondary | ICD-10-CM | POA: Diagnosis not present

## 2023-07-09 DIAGNOSIS — M6389 Disorders of muscle in diseases classified elsewhere, multiple sites: Secondary | ICD-10-CM | POA: Diagnosis not present

## 2023-07-10 DIAGNOSIS — I69391 Dysphagia following cerebral infarction: Secondary | ICD-10-CM | POA: Diagnosis not present

## 2023-07-10 DIAGNOSIS — R1312 Dysphagia, oropharyngeal phase: Secondary | ICD-10-CM | POA: Diagnosis not present

## 2023-07-10 DIAGNOSIS — I69322 Dysarthria following cerebral infarction: Secondary | ICD-10-CM | POA: Diagnosis not present

## 2023-07-11 DIAGNOSIS — I6521 Occlusion and stenosis of right carotid artery: Secondary | ICD-10-CM | POA: Diagnosis not present

## 2023-07-11 DIAGNOSIS — I69354 Hemiplegia and hemiparesis following cerebral infarction affecting left non-dominant side: Secondary | ICD-10-CM | POA: Diagnosis not present

## 2023-07-11 DIAGNOSIS — R278 Other lack of coordination: Secondary | ICD-10-CM | POA: Diagnosis not present

## 2023-07-11 DIAGNOSIS — M6389 Disorders of muscle in diseases classified elsewhere, multiple sites: Secondary | ICD-10-CM | POA: Diagnosis not present

## 2023-07-11 DIAGNOSIS — I6621 Occlusion and stenosis of right posterior cerebral artery: Secondary | ICD-10-CM | POA: Diagnosis not present

## 2023-07-12 DIAGNOSIS — I69391 Dysphagia following cerebral infarction: Secondary | ICD-10-CM | POA: Diagnosis not present

## 2023-07-12 DIAGNOSIS — I69322 Dysarthria following cerebral infarction: Secondary | ICD-10-CM | POA: Diagnosis not present

## 2023-07-12 DIAGNOSIS — R1312 Dysphagia, oropharyngeal phase: Secondary | ICD-10-CM | POA: Diagnosis not present

## 2023-07-13 DIAGNOSIS — I69354 Hemiplegia and hemiparesis following cerebral infarction affecting left non-dominant side: Secondary | ICD-10-CM | POA: Diagnosis not present

## 2023-07-13 DIAGNOSIS — M6389 Disorders of muscle in diseases classified elsewhere, multiple sites: Secondary | ICD-10-CM | POA: Diagnosis not present

## 2023-07-13 DIAGNOSIS — I6521 Occlusion and stenosis of right carotid artery: Secondary | ICD-10-CM | POA: Diagnosis not present

## 2023-07-13 DIAGNOSIS — R278 Other lack of coordination: Secondary | ICD-10-CM | POA: Diagnosis not present

## 2023-07-13 DIAGNOSIS — I6621 Occlusion and stenosis of right posterior cerebral artery: Secondary | ICD-10-CM | POA: Diagnosis not present

## 2023-07-16 DIAGNOSIS — I69322 Dysarthria following cerebral infarction: Secondary | ICD-10-CM | POA: Diagnosis not present

## 2023-07-16 DIAGNOSIS — I69391 Dysphagia following cerebral infarction: Secondary | ICD-10-CM | POA: Diagnosis not present

## 2023-07-16 DIAGNOSIS — R1312 Dysphagia, oropharyngeal phase: Secondary | ICD-10-CM | POA: Diagnosis not present

## 2023-07-17 DIAGNOSIS — R1312 Dysphagia, oropharyngeal phase: Secondary | ICD-10-CM | POA: Diagnosis not present

## 2023-07-17 DIAGNOSIS — I69391 Dysphagia following cerebral infarction: Secondary | ICD-10-CM | POA: Diagnosis not present

## 2023-07-17 DIAGNOSIS — I69322 Dysarthria following cerebral infarction: Secondary | ICD-10-CM | POA: Diagnosis not present

## 2023-07-18 DIAGNOSIS — I6621 Occlusion and stenosis of right posterior cerebral artery: Secondary | ICD-10-CM | POA: Diagnosis not present

## 2023-07-18 DIAGNOSIS — I6521 Occlusion and stenosis of right carotid artery: Secondary | ICD-10-CM | POA: Diagnosis not present

## 2023-07-18 DIAGNOSIS — R278 Other lack of coordination: Secondary | ICD-10-CM | POA: Diagnosis not present

## 2023-07-18 DIAGNOSIS — I69354 Hemiplegia and hemiparesis following cerebral infarction affecting left non-dominant side: Secondary | ICD-10-CM | POA: Diagnosis not present

## 2023-07-18 DIAGNOSIS — M6389 Disorders of muscle in diseases classified elsewhere, multiple sites: Secondary | ICD-10-CM | POA: Diagnosis not present

## 2023-07-19 ENCOUNTER — Non-Acute Institutional Stay (SKILLED_NURSING_FACILITY): Payer: Medicare Other | Admitting: Adult Health

## 2023-07-19 DIAGNOSIS — I679 Cerebrovascular disease, unspecified: Secondary | ICD-10-CM

## 2023-07-19 DIAGNOSIS — I69322 Dysarthria following cerebral infarction: Secondary | ICD-10-CM | POA: Diagnosis not present

## 2023-07-19 DIAGNOSIS — I4821 Permanent atrial fibrillation: Secondary | ICD-10-CM

## 2023-07-19 DIAGNOSIS — I10 Essential (primary) hypertension: Secondary | ICD-10-CM | POA: Diagnosis not present

## 2023-07-19 DIAGNOSIS — D509 Iron deficiency anemia, unspecified: Secondary | ICD-10-CM

## 2023-07-19 DIAGNOSIS — R1312 Dysphagia, oropharyngeal phase: Secondary | ICD-10-CM

## 2023-07-19 DIAGNOSIS — I69354 Hemiplegia and hemiparesis following cerebral infarction affecting left non-dominant side: Secondary | ICD-10-CM | POA: Diagnosis not present

## 2023-07-19 DIAGNOSIS — I69391 Dysphagia following cerebral infarction: Secondary | ICD-10-CM | POA: Diagnosis not present

## 2023-07-19 DIAGNOSIS — I442 Atrioventricular block, complete: Secondary | ICD-10-CM | POA: Diagnosis not present

## 2023-07-20 ENCOUNTER — Encounter: Payer: Self-pay | Admitting: Adult Health

## 2023-07-20 DIAGNOSIS — M6389 Disorders of muscle in diseases classified elsewhere, multiple sites: Secondary | ICD-10-CM | POA: Diagnosis not present

## 2023-07-20 DIAGNOSIS — I69354 Hemiplegia and hemiparesis following cerebral infarction affecting left non-dominant side: Secondary | ICD-10-CM | POA: Diagnosis not present

## 2023-07-20 DIAGNOSIS — I6621 Occlusion and stenosis of right posterior cerebral artery: Secondary | ICD-10-CM | POA: Diagnosis not present

## 2023-07-20 DIAGNOSIS — R278 Other lack of coordination: Secondary | ICD-10-CM | POA: Diagnosis not present

## 2023-07-20 DIAGNOSIS — I6521 Occlusion and stenosis of right carotid artery: Secondary | ICD-10-CM | POA: Diagnosis not present

## 2023-07-20 NOTE — Progress Notes (Addendum)
Location:  Medical illustrator of Service:  SNF (31) Provider:   Peggye Ley, ANP [facility] [phone]   Mahlon Gammon, MD  Patient Care Team: Mahlon Gammon, MD as PCP - General (Internal Medicine) Marinus Maw, MD as PCP - Electrophysiology (Cardiology) Mathews Robinsons, MD as Consulting Physician (Dermatology) Marinus Maw, MD as Consulting Physician (Cardiology) [facility] Jeanella Craze, MD as Consulting Physician (Dermatology)  Extended Emergency Contact Information Primary Emergency Contact: Lynnell Grain of [place] Home Phone: [phone] Mobile Phone: [phone] Relation: Daughter Secondary Emergency Contact: Claud Kelp of [place] Home Phone: [phone] Mobile Phone: [phone] Relation: Son  Code Status:  DNR Goals of care: Advanced Directive information    05/22/2023    3:16 PM  Advanced Directives  Does Patient Have a Medical Advance Directive? Yes  Type of Estate agent of [place];Living will;Out of facility DNR (pink MOST or yellow form)  Does patient want to make changes to medical advance directive? No - Patient declined  Copy of Healthcare Power of Attorney in Chart? Yes - validated most recent copy scanned in chart (See row information)  Pre-existing out of facility DNR order (yellow form or pink MOST form) Pink MOST form placed in chart (order not valid for inpatient use);Yellow form placed in chart (order not valid for inpatient use)     Chief Complaint  Patient presents with   Medical Management of Chronic Issues    HPI:  Pt is a 89 y.o. female seen today for medical management of chronic diseases.    VQQ:[id number] 10/02/22 and diagnosed with a right large PCA infarct s/p TNK. Left sided weakness, dysphagia, and dysarthria present.  Has a hx of prior CVA as well along with afib, pacemaker, HTN, bakers cysts with  bleeding, allergic rhinitis, back pain, CAD, anemia, thrombocytopenia, and OP  This is a routine review.  No acute complaints Weight are stable BP within acceptable range No choking episodes or fever. On D 1 diet with HTL Had hypernatremia in July resolved with IVF.  Last BMP NA 140 K 4.5 LBM 9/12 Has healed wound to the sacral area, Allevyn dressing for protection No wounds to feet, wearing boots for protection Hoyer lift is used to get her OOB to [place] chair She remains verbal and pleasant. Confused at baseline but able to express herself.    Past Medical History:  Diagnosis Date   Actinic keratosis 05/25/2014   Anxiety    Atrial fibrillation (HCC) 09/21/2010   Basal cell carcinoma 05/25/2014   Multiple removed by Dr. Arminda Resides in March 2015: right neck, left neck, scalp    Bell's palsy 07/18/1979   Cervicalgia 01/31/2012   Closed fracture of lumbar vertebra without mention of spinal cord injury 07/17/2005   Conjunctiva disorder 12/26/2010   Coronary atherosclerosis of native coronary artery 07/17/1997   Cramp of limb 08/16/2009   Degeneration of lumbar or lumbosacral intervertebral disc 01/31/2012   Disturbance of skin sensation 08/2009   Dizziness and giddiness 11/08/2011   vertigo   External hemorrhoids without mention of complication 06/27/2010   External hemorrhoids without mention of complication 08/26/2012   Ganglion of tendon sheath 08/16/2009   Leg cramp 10/27/2013   Most frequently the right leg.    Long term (current) use of anticoagulants 09/2010   Lumbago 07/2009   Meralgia paresthetica 07/2007   MI, old    Myalgia and myositis, unspecified 01/17/2012   Osteoporosis  Other abnormal blood chemistry 04/08/2012   Other abnormal blood chemistry 2013   hyperglycemia   Other disorder of muscle, ligament, and fascia 04/08/2012   Other specified cardiac dysrhythmias(427.89) 06/27/2010   Pacemaker 05/06/2020   Pain in joint, ankle and foot 10/27/2013    Bilateral since 1998    Pain in joint, shoulder region 08/12/2012   Pain in joint, upper arm 12/26/2010   Pain in limb 01/11/2011   Pain in thoracic spine 01/31/2012   Pathologic fracture of vertebrae 01/22/2012   PVC's (premature ventricular contractions)    Rash and other nonspecific skin eruption 08/02/2011   Senile osteoporosis 07/17/1993   Stroke (HCC)    Unspecified essential hypertension 07/17/1997   Varicose veins of lower extremities 08/16/2009   Varicose veins of lower extremities 08/16/2009   Past Surgical History:  Procedure Laterality Date   COLONOSCOPY WITH PROPOFOL N/A 10/19/2015   Procedure: COLONOSCOPY WITH PROPOFOL;  Surgeon: Iva Boop, MD;  Location: [facility] ENDOSCOPY;  Service: Endoscopy;  Laterality: N/A;   CORONARY ARTERY BYPASS GRAFT  1998   x2; LIMA to LAD; SVG to diagonal off bypass   HEMORRHOID SURGERY  08/26/2012   Dr. Luisa Hart   LOOP RECORDER INSERTION N/A 05/13/2018   Procedure: LOOP RECORDER INSERTION;  Surgeon: Marinus Maw, MD;  Location: [facility] INVASIVE CV LAB;  Service: Cardiovascular;  Laterality: N/A;   LOOP RECORDER REMOVAL N/A 05/26/2019   Procedure: LOOP RECORDER REMOVAL;  Surgeon: Marinus Maw, MD;  Location: [facility] INVASIVE CV LAB;  Service: Cardiovascular;  Laterality: N/A;   MASS EXCISION Left 11/23/2015   Procedure: LEFT WRIST EXCISION CYST;  Surgeon: Betha Loa, MD;  Location: [facility];  Service: Orthopedics;  Laterality: Left;   PACEMAKER IMPLANT N/A 05/26/2019   Procedure: PACEMAKER IMPLANT;  Surgeon: Marinus Maw, MD;  Location: [facility] INVASIVE CV LAB;  Service: Cardiovascular;  Laterality: N/A;   SKIN BIOPSY  01/29/14   (R) neck; (R) scalp, 2 (L) neck; shave biopsy Superficial basal cell carcinoma Dr. Arminda Resides   TONSILLECTOMY  [phone]    Allergies  Allergen Reactions   Iodinated Contrast Media Nausea Only and Other (See Comments)    Severe nausea and also passed out   Iodine Nausea Only and Other (See Comments)     "Allergic," per [facility]- PASSED OUT   Lodine [Etodolac] Other (See Comments)    "Allergic," per [facility]   Other Other (See Comments)    Seasonal Allergies   Bactrim Rash   Relafen [Nabumetone] Rash   Sulfamethoxazole-Trimethoprim Rash    In matrix   Sulfanilamide Rash    Outpatient Encounter Medications as of 07/19/2023  Medication Sig   acetaminophen (TYLENOL) 325 MG tablet Take 650 mg by mouth 3 (three) times daily as needed (for pain).   Amino Acids-Protein Hydrolys (PRO-STAT) LIQD Take 30 mLs by mouth every morning.   amLODipine (NORVASC) 5 MG tablet Take 5 mg by mouth daily.   Artificial Saliva (BIOTENE DRY MOUTH MOISTURIZING) SOLN Use as directed 1-2 sprays in the mouth or throat 3 (three) times daily.   aspirin 81 MG chewable tablet Chew 1 tablet (81 mg total) by mouth daily.   atenolol (TENORMIN) 50 MG tablet Take 1 tablet (50 mg total) by mouth 2 (two) times daily.   Calcium Carb-Cholecalciferol (CALCIUM 500 + D3 PO) Take 1 capsule by mouth in the morning.   chlorhexidine (PERIDEX) 0.12 % solution Use as directed 15 mLs in the mouth or throat 2 (two) times daily.  diclofenac Sodium (VOLTAREN) 1 % GEL Apply 4 g topically as needed.   digoxin (LANOXIN) 0.125 MG tablet Take 1 tablet (0.125 mg total) by mouth every other day.   docusate (COLACE) 50 MG/5ML liquid Take 100 mg by mouth daily.   hydrALAZINE (APRESOLINE) 10 MG tablet Take 10 mg by mouth as needed (SBP>180).   ipratropium (ATROVENT HFA) 17 MCG/ACT inhaler Inhale 2 puffs into the lungs every morning.   levocetirizine (XYZAL) 5 MG tablet Take 5 mg by mouth at bedtime.   Multiple Vitamins-Minerals (MULTIVITAMIN WITH IRON-MINERALS) liquid Take 15 mLs by mouth daily.   polyethylene glycol (MIRALAX / GLYCOLAX) 17 g packet Take 17 g by mouth See admin instructions. Mix 17 grams of powder into 4-6 oz of beverage of choice and drink once a day   promethazine (PHENERGAN) 12.5 MG suppository Place 12.5 mg rectally every 6 (six) hours as  needed for nausea or vomiting. 12.5 mg, rectal, Every 6 Hours - PRN, Promethazine suppository 12.5 mg every 6 hours as needed if vomiting or cannot take PO.   sodium chloride (OCEAN) 0.65 % nasal spray Place 1 spray into the nose 2 (two) times daily as needed for congestion.   No facility-administered encounter medications on file as of 07/19/2023.    Review of Systems  Constitutional:  Negative for activity change, appetite change, chills, diaphoresis, fatigue, fever and unexpected weight change.  HENT:  Positive for congestion and trouble swallowing.   Respiratory:  Negative for cough, shortness of breath and wheezing.   Cardiovascular:  Negative for chest pain, palpitations and leg swelling.  Gastrointestinal:  Negative for abdominal distention, abdominal pain, constipation and diarrhea.  Genitourinary:  Negative for difficulty urinating and dysuria.  Musculoskeletal:  Positive for gait problem. Negative for arthralgias, back pain, joint swelling and myalgias.  Neurological:  Positive for speech difficulty and weakness. Negative for dizziness, tremors, seizures, syncope, facial asymmetry, light-headedness, numbness and headaches.  Psychiatric/Behavioral:  Positive for confusion. Negative for agitation and behavioral problems.     Immunization History  Administered Date(s) Administered   Influenza Whole 08/06/2012   Influenza, High Dose Seasonal PF 08/15/2019, 08/27/2020   Influenza,inj,Quad PF,6+ Mos 08/30/2018   Influenza-Unspecified 08/06/2013, 08/24/2014, 08/26/2015, 08/31/2016, 08/27/2017, 08/27/2020   Moderna Sars-Covid-2 Vaccination 11/16/2019, 12/16/2019, 09/16/2020   Pneumococcal Conjugate-13 09/22/2015   Pneumococcal Polysaccharide-23 08/29/2006   Td 10/07/2007   Zoster Recombinant(Shingrix) 02/12/2018, 05/13/2018   Zoster, Live 03/12/2008   Pertinent  Health Maintenance Due  Topic Date Due   INFLUENZA VACCINE  06/07/2023   DEXA SCAN  Completed      10/02/2022    8:00  AM 10/03/2022   10:49 AM 10/04/2022    9:50 AM 10/06/2022   10:31 AM 12/01/2022    9:28 AM  Fall Risk  Falls in the past year?  1 1 1 1   Was there an injury with Fall?  1 1 1 1   Fall Risk Category Calculator  3 3 2 2   Fall Risk Category (Retired)  High High Moderate   (RETIRED) Patient Fall Risk Level High fall risk High fall risk High fall risk High fall risk   Patient at Risk for Falls Due to  History of fall(s) History of fall(s) History of fall(s) History of fall(s)  Fall risk Follow up  Falls evaluation completed Falls evaluation completed Falls evaluation completed Falls evaluation completed   Functional Status Survey:    Vitals:   07/20/23 0825  BP: 132/82  Pulse: 76  Resp: 17  Temp: (!)  97 F (36.1 C)  SpO2: 94%  Weight: 124 lb 12.8 oz (56.6 kg)   Body mass index is 20.14 kg/m. Physical Exam Vitals and nursing note reviewed.  Constitutional:      General: She is not in acute distress.    Appearance: She is not diaphoretic.  HENT:     Head: Normocephalic and atraumatic.     Mouth/Throat:     Mouth: Mucous membranes are moist.     Pharynx: Oropharynx is clear.  Neck:     Vascular: No JVD.  Cardiovascular:     Rate and Rhythm: Normal rate. Rhythm irregular.     Heart sounds: No murmur heard. Pulmonary:     Effort: Pulmonary effort is normal. No respiratory distress.     Breath sounds: Normal breath sounds. No wheezing.  Abdominal:     General: Bowel sounds are normal. There is no distension.     Palpations: Abdomen is soft.     Tenderness: There is no abdominal tenderness.  Musculoskeletal:     Right lower leg: No edema.     Left lower leg: No edema.  Skin:    General: Skin is warm and dry.  Neurological:     Mental Status: She is alert. Mental status is at baseline.     Comments: Left sided weakness  Psychiatric:        Mood and Affect: Mood normal.     Labs reviewed: Recent Labs    09/30/22 0931 10/01/22 0806 10/02/22 0900 10/21/22 0000  05/10/23 0000 05/14/23 0000 05/16/23 0000 05/18/23 0000  NA 141 142 142   < > 150* 150* 147  --   K 3.6 3.3* 3.9   < > 4.0 4.6 4.5  --   CL 108 111 116*   < > 116* 114* 111*  --   CO2 22 20* 19*   < > 26* 23* 18  --   GLUCOSE 107* 114* 108*  --   --   --   --   --   BUN 34* 36* 35*   < > 55* 43* 44*  --   CREATININE 1.25* 1.21* 1.09*   < > 1.3* 1.2* 1.2*  --   CALCIUM 8.5* 8.2* 8.2*   < > 8.5* 8.6* 9.0 8.6*   < > = values in this interval not displayed.   Recent Labs    09/26/22 1539 10/21/22 0000 01/02/23 0000 02/02/23 0000 05/07/23 0000  AST 17   < > 21 21 24   ALT 12   < > 21 17 16   ALKPHOS 71   < > 80 83 57  BILITOT 0.3  --   --   --   --   PROT 7.1  --   --   --   --   ALBUMIN 3.1*   < > 3.1* 3.4* 3.3*   < > = values in this interval not displayed.   Recent Labs    09/30/22 0931 10/01/22 0806 10/02/22 0900 10/09/22 0000 02/02/23 0000 02/21/23 0000 05/07/23 0000 05/21/23 0000  WBC 10.7* 11.0* 9.9   < > 9.1 9.3 12.4 14.4  NEUTROABS  --   --   --    < > 5.70 5.00 7.90  --   HGB 9.2* 8.5* 8.8*   < > 9.2* 10.5* 10.6* 8.9*  HCT 28.9* 25.7* 27.2*   < > 30* 32* 32* 29*  MCV 88.1 87.1 86.6  --   --   --   --   --  PLT 96* 87* 109*   < > 182 163 148* 124*   < > = values in this interval not displayed.   Lab Results  Component Value Date   TSH 2.87 02/03/2022   Lab Results  Component Value Date   HGBA1C 5.7 (H) 09/26/2022   Lab Results  Component Value Date   CHOL 161 09/27/2022   HDL 39 (L) 09/27/2022   LDLCALC 114 (H) 09/27/2022   LDLDIRECT 81.3 08/19/2020   TRIG 39 09/27/2022   CHOLHDL 4.1 09/27/2022    Significant Diagnostic Results in last 30 days:  No results found.  Assessment/Plan 1. Oropharyngeal dysphagia Continue current diet Asp prec 1:1 supervision Sensitive to dehydration, needs encouragement to drink water.   2. 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   3.  Permanent atrial fibrillation (HCC) Rate controlled On baby asa due to bleeding risk   4. Atrioventricular block, complete (HCC) Pacemaker Remote transmission   5. Essential hypertension Controlled   6. Cerebrovascular disease With cognitive impairment.  Appropriate for skilled care.   7. IDA On MVI with iron, did not tolerate iron supplement.    Family/ staff Communication: nurse  Labs/tests ordered: Had BMP 06/07/23, will need f/u CBC BMP with next visit.

## 2023-07-23 DIAGNOSIS — I69391 Dysphagia following cerebral infarction: Secondary | ICD-10-CM | POA: Diagnosis not present

## 2023-07-23 DIAGNOSIS — R1312 Dysphagia, oropharyngeal phase: Secondary | ICD-10-CM | POA: Diagnosis not present

## 2023-07-23 DIAGNOSIS — I69322 Dysarthria following cerebral infarction: Secondary | ICD-10-CM | POA: Diagnosis not present

## 2023-07-24 DIAGNOSIS — I69322 Dysarthria following cerebral infarction: Secondary | ICD-10-CM | POA: Diagnosis not present

## 2023-07-24 DIAGNOSIS — R1312 Dysphagia, oropharyngeal phase: Secondary | ICD-10-CM | POA: Diagnosis not present

## 2023-07-24 DIAGNOSIS — I69391 Dysphagia following cerebral infarction: Secondary | ICD-10-CM | POA: Diagnosis not present

## 2023-07-25 DIAGNOSIS — R278 Other lack of coordination: Secondary | ICD-10-CM | POA: Diagnosis not present

## 2023-07-25 DIAGNOSIS — M6389 Disorders of muscle in diseases classified elsewhere, multiple sites: Secondary | ICD-10-CM | POA: Diagnosis not present

## 2023-07-25 DIAGNOSIS — I69354 Hemiplegia and hemiparesis following cerebral infarction affecting left non-dominant side: Secondary | ICD-10-CM | POA: Diagnosis not present

## 2023-07-25 DIAGNOSIS — I6621 Occlusion and stenosis of right posterior cerebral artery: Secondary | ICD-10-CM | POA: Diagnosis not present

## 2023-07-25 DIAGNOSIS — I6521 Occlusion and stenosis of right carotid artery: Secondary | ICD-10-CM | POA: Diagnosis not present

## 2023-07-25 NOTE — Telephone Encounter (Signed)
Patient had an immunization that was entered in error. Called and spoke with patient daughter Marylu Lund to let her know that the immunization has now been deleted

## 2023-07-26 DIAGNOSIS — R1312 Dysphagia, oropharyngeal phase: Secondary | ICD-10-CM | POA: Diagnosis not present

## 2023-07-26 DIAGNOSIS — I69322 Dysarthria following cerebral infarction: Secondary | ICD-10-CM | POA: Diagnosis not present

## 2023-07-26 DIAGNOSIS — I69391 Dysphagia following cerebral infarction: Secondary | ICD-10-CM | POA: Diagnosis not present

## 2023-07-27 DIAGNOSIS — R1312 Dysphagia, oropharyngeal phase: Secondary | ICD-10-CM | POA: Diagnosis not present

## 2023-07-27 DIAGNOSIS — I69322 Dysarthria following cerebral infarction: Secondary | ICD-10-CM | POA: Diagnosis not present

## 2023-07-27 DIAGNOSIS — I69391 Dysphagia following cerebral infarction: Secondary | ICD-10-CM | POA: Diagnosis not present

## 2023-07-30 DIAGNOSIS — R1312 Dysphagia, oropharyngeal phase: Secondary | ICD-10-CM | POA: Diagnosis not present

## 2023-07-30 DIAGNOSIS — I69322 Dysarthria following cerebral infarction: Secondary | ICD-10-CM | POA: Diagnosis not present

## 2023-07-30 DIAGNOSIS — I69391 Dysphagia following cerebral infarction: Secondary | ICD-10-CM | POA: Diagnosis not present

## 2023-07-31 DIAGNOSIS — M6389 Disorders of muscle in diseases classified elsewhere, multiple sites: Secondary | ICD-10-CM | POA: Diagnosis not present

## 2023-07-31 DIAGNOSIS — I69354 Hemiplegia and hemiparesis following cerebral infarction affecting left non-dominant side: Secondary | ICD-10-CM | POA: Diagnosis not present

## 2023-07-31 DIAGNOSIS — R278 Other lack of coordination: Secondary | ICD-10-CM | POA: Diagnosis not present

## 2023-07-31 DIAGNOSIS — I6521 Occlusion and stenosis of right carotid artery: Secondary | ICD-10-CM | POA: Diagnosis not present

## 2023-07-31 DIAGNOSIS — I6621 Occlusion and stenosis of right posterior cerebral artery: Secondary | ICD-10-CM | POA: Diagnosis not present

## 2023-08-01 DIAGNOSIS — I69322 Dysarthria following cerebral infarction: Secondary | ICD-10-CM | POA: Diagnosis not present

## 2023-08-01 DIAGNOSIS — R1312 Dysphagia, oropharyngeal phase: Secondary | ICD-10-CM | POA: Diagnosis not present

## 2023-08-01 DIAGNOSIS — I69391 Dysphagia following cerebral infarction: Secondary | ICD-10-CM | POA: Diagnosis not present

## 2023-08-06 DIAGNOSIS — R1312 Dysphagia, oropharyngeal phase: Secondary | ICD-10-CM | POA: Diagnosis not present

## 2023-08-06 DIAGNOSIS — I69391 Dysphagia following cerebral infarction: Secondary | ICD-10-CM | POA: Diagnosis not present

## 2023-08-06 DIAGNOSIS — I69322 Dysarthria following cerebral infarction: Secondary | ICD-10-CM | POA: Diagnosis not present

## 2023-08-07 DIAGNOSIS — I69322 Dysarthria following cerebral infarction: Secondary | ICD-10-CM | POA: Diagnosis not present

## 2023-08-07 DIAGNOSIS — I69391 Dysphagia following cerebral infarction: Secondary | ICD-10-CM | POA: Diagnosis not present

## 2023-08-07 DIAGNOSIS — R1312 Dysphagia, oropharyngeal phase: Secondary | ICD-10-CM | POA: Diagnosis not present

## 2023-08-09 DIAGNOSIS — I69354 Hemiplegia and hemiparesis following cerebral infarction affecting left non-dominant side: Secondary | ICD-10-CM | POA: Diagnosis not present

## 2023-08-09 DIAGNOSIS — I69391 Dysphagia following cerebral infarction: Secondary | ICD-10-CM | POA: Diagnosis not present

## 2023-08-09 DIAGNOSIS — R1312 Dysphagia, oropharyngeal phase: Secondary | ICD-10-CM | POA: Diagnosis not present

## 2023-08-09 DIAGNOSIS — I6621 Occlusion and stenosis of right posterior cerebral artery: Secondary | ICD-10-CM | POA: Diagnosis not present

## 2023-08-09 DIAGNOSIS — R278 Other lack of coordination: Secondary | ICD-10-CM | POA: Diagnosis not present

## 2023-08-09 DIAGNOSIS — M6389 Disorders of muscle in diseases classified elsewhere, multiple sites: Secondary | ICD-10-CM | POA: Diagnosis not present

## 2023-08-09 DIAGNOSIS — I6521 Occlusion and stenosis of right carotid artery: Secondary | ICD-10-CM | POA: Diagnosis not present

## 2023-08-09 DIAGNOSIS — I69322 Dysarthria following cerebral infarction: Secondary | ICD-10-CM | POA: Diagnosis not present

## 2023-08-13 DIAGNOSIS — I69391 Dysphagia following cerebral infarction: Secondary | ICD-10-CM | POA: Diagnosis not present

## 2023-08-13 DIAGNOSIS — R1312 Dysphagia, oropharyngeal phase: Secondary | ICD-10-CM | POA: Diagnosis not present

## 2023-08-13 DIAGNOSIS — I69322 Dysarthria following cerebral infarction: Secondary | ICD-10-CM | POA: Diagnosis not present

## 2023-08-14 DIAGNOSIS — R1312 Dysphagia, oropharyngeal phase: Secondary | ICD-10-CM | POA: Diagnosis not present

## 2023-08-14 DIAGNOSIS — I69391 Dysphagia following cerebral infarction: Secondary | ICD-10-CM | POA: Diagnosis not present

## 2023-08-14 DIAGNOSIS — R278 Other lack of coordination: Secondary | ICD-10-CM | POA: Diagnosis not present

## 2023-08-14 DIAGNOSIS — I69322 Dysarthria following cerebral infarction: Secondary | ICD-10-CM | POA: Diagnosis not present

## 2023-08-14 DIAGNOSIS — M6389 Disorders of muscle in diseases classified elsewhere, multiple sites: Secondary | ICD-10-CM | POA: Diagnosis not present

## 2023-08-14 DIAGNOSIS — I69354 Hemiplegia and hemiparesis following cerebral infarction affecting left non-dominant side: Secondary | ICD-10-CM | POA: Diagnosis not present

## 2023-08-14 DIAGNOSIS — I6621 Occlusion and stenosis of right posterior cerebral artery: Secondary | ICD-10-CM | POA: Diagnosis not present

## 2023-08-14 DIAGNOSIS — I6521 Occlusion and stenosis of right carotid artery: Secondary | ICD-10-CM | POA: Diagnosis not present

## 2023-08-16 DIAGNOSIS — I69391 Dysphagia following cerebral infarction: Secondary | ICD-10-CM | POA: Diagnosis not present

## 2023-08-16 DIAGNOSIS — R1312 Dysphagia, oropharyngeal phase: Secondary | ICD-10-CM | POA: Diagnosis not present

## 2023-08-16 DIAGNOSIS — I69322 Dysarthria following cerebral infarction: Secondary | ICD-10-CM | POA: Diagnosis not present

## 2023-08-20 DIAGNOSIS — R1312 Dysphagia, oropharyngeal phase: Secondary | ICD-10-CM | POA: Diagnosis not present

## 2023-08-20 DIAGNOSIS — I69322 Dysarthria following cerebral infarction: Secondary | ICD-10-CM | POA: Diagnosis not present

## 2023-08-20 DIAGNOSIS — I69391 Dysphagia following cerebral infarction: Secondary | ICD-10-CM | POA: Diagnosis not present

## 2023-08-21 DIAGNOSIS — R1312 Dysphagia, oropharyngeal phase: Secondary | ICD-10-CM | POA: Diagnosis not present

## 2023-08-21 DIAGNOSIS — I69391 Dysphagia following cerebral infarction: Secondary | ICD-10-CM | POA: Diagnosis not present

## 2023-08-21 DIAGNOSIS — I69322 Dysarthria following cerebral infarction: Secondary | ICD-10-CM | POA: Diagnosis not present

## 2023-08-23 DIAGNOSIS — I69391 Dysphagia following cerebral infarction: Secondary | ICD-10-CM | POA: Diagnosis not present

## 2023-08-23 DIAGNOSIS — R1312 Dysphagia, oropharyngeal phase: Secondary | ICD-10-CM | POA: Diagnosis not present

## 2023-08-23 DIAGNOSIS — I69322 Dysarthria following cerebral infarction: Secondary | ICD-10-CM | POA: Diagnosis not present

## 2023-08-24 DIAGNOSIS — I6521 Occlusion and stenosis of right carotid artery: Secondary | ICD-10-CM | POA: Diagnosis not present

## 2023-08-24 DIAGNOSIS — M6389 Disorders of muscle in diseases classified elsewhere, multiple sites: Secondary | ICD-10-CM | POA: Diagnosis not present

## 2023-08-24 DIAGNOSIS — R278 Other lack of coordination: Secondary | ICD-10-CM | POA: Diagnosis not present

## 2023-08-24 DIAGNOSIS — I6621 Occlusion and stenosis of right posterior cerebral artery: Secondary | ICD-10-CM | POA: Diagnosis not present

## 2023-08-24 DIAGNOSIS — I69354 Hemiplegia and hemiparesis following cerebral infarction affecting left non-dominant side: Secondary | ICD-10-CM | POA: Diagnosis not present

## 2023-08-27 DIAGNOSIS — I69391 Dysphagia following cerebral infarction: Secondary | ICD-10-CM | POA: Diagnosis not present

## 2023-08-27 DIAGNOSIS — R1312 Dysphagia, oropharyngeal phase: Secondary | ICD-10-CM | POA: Diagnosis not present

## 2023-08-27 DIAGNOSIS — I69322 Dysarthria following cerebral infarction: Secondary | ICD-10-CM | POA: Diagnosis not present

## 2023-08-28 DIAGNOSIS — I6521 Occlusion and stenosis of right carotid artery: Secondary | ICD-10-CM | POA: Diagnosis not present

## 2023-08-28 DIAGNOSIS — I69391 Dysphagia following cerebral infarction: Secondary | ICD-10-CM | POA: Diagnosis not present

## 2023-08-28 DIAGNOSIS — R1312 Dysphagia, oropharyngeal phase: Secondary | ICD-10-CM | POA: Diagnosis not present

## 2023-08-28 DIAGNOSIS — R278 Other lack of coordination: Secondary | ICD-10-CM | POA: Diagnosis not present

## 2023-08-28 DIAGNOSIS — M6389 Disorders of muscle in diseases classified elsewhere, multiple sites: Secondary | ICD-10-CM | POA: Diagnosis not present

## 2023-08-28 DIAGNOSIS — I69354 Hemiplegia and hemiparesis following cerebral infarction affecting left non-dominant side: Secondary | ICD-10-CM | POA: Diagnosis not present

## 2023-08-28 DIAGNOSIS — I69322 Dysarthria following cerebral infarction: Secondary | ICD-10-CM | POA: Diagnosis not present

## 2023-08-28 DIAGNOSIS — I6621 Occlusion and stenosis of right posterior cerebral artery: Secondary | ICD-10-CM | POA: Diagnosis not present

## 2023-08-30 DIAGNOSIS — R1312 Dysphagia, oropharyngeal phase: Secondary | ICD-10-CM | POA: Diagnosis not present

## 2023-08-30 DIAGNOSIS — I69322 Dysarthria following cerebral infarction: Secondary | ICD-10-CM | POA: Diagnosis not present

## 2023-08-30 DIAGNOSIS — I69391 Dysphagia following cerebral infarction: Secondary | ICD-10-CM | POA: Diagnosis not present

## 2023-08-31 ENCOUNTER — Ambulatory Visit (INDEPENDENT_AMBULATORY_CARE_PROVIDER_SITE_OTHER): Payer: Medicare Other

## 2023-08-31 DIAGNOSIS — I472 Ventricular tachycardia, unspecified: Secondary | ICD-10-CM

## 2023-08-31 LAB — CUP PACEART REMOTE DEVICE CHECK
Battery Remaining Longevity: 100 mo
Battery Remaining Percentage: 74 %
Battery Voltage: 3.01 V
Brady Statistic RV Percent Paced: 11 %
Date Time Interrogation Session: 20241025020012
Implantable Lead Connection Status: 753985
Implantable Lead Implant Date: 20200720
Implantable Lead Location: 753860
Implantable Pulse Generator Implant Date: 20200720
Lead Channel Impedance Value: 490 Ohm
Lead Channel Pacing Threshold Amplitude: 1 V
Lead Channel Pacing Threshold Pulse Width: 0.5 ms
Lead Channel Sensing Intrinsic Amplitude: 5.4 mV
Lead Channel Setting Pacing Amplitude: 2.5 V
Lead Channel Setting Pacing Pulse Width: 0.5 ms
Lead Channel Setting Sensing Sensitivity: 0.5 mV
Pulse Gen Model: 1272
Pulse Gen Serial Number: 9122450

## 2023-09-03 ENCOUNTER — Encounter: Payer: Self-pay | Admitting: Internal Medicine

## 2023-09-03 ENCOUNTER — Non-Acute Institutional Stay (SKILLED_NURSING_FACILITY): Payer: Self-pay | Admitting: Internal Medicine

## 2023-09-03 DIAGNOSIS — I69391 Dysphagia following cerebral infarction: Secondary | ICD-10-CM | POA: Diagnosis not present

## 2023-09-03 DIAGNOSIS — R1312 Dysphagia, oropharyngeal phase: Secondary | ICD-10-CM | POA: Diagnosis not present

## 2023-09-03 DIAGNOSIS — I69354 Hemiplegia and hemiparesis following cerebral infarction affecting left non-dominant side: Secondary | ICD-10-CM

## 2023-09-03 DIAGNOSIS — I69322 Dysarthria following cerebral infarction: Secondary | ICD-10-CM | POA: Diagnosis not present

## 2023-09-03 DIAGNOSIS — I10 Essential (primary) hypertension: Secondary | ICD-10-CM | POA: Diagnosis not present

## 2023-09-03 DIAGNOSIS — I4821 Permanent atrial fibrillation: Secondary | ICD-10-CM | POA: Diagnosis not present

## 2023-09-03 NOTE — Progress Notes (Signed)
Location:  Medical illustrator of Service:  SNF (31)  Provider:   Code Status: DNR Goals of Care:     05/22/2023    3:16 PM  Advanced Directives  Does Patient Have a Medical Advance Directive? Yes  Type of Estate agent of [place];Living will;Out of facility DNR (pink MOST or yellow form)  Does patient want to make changes to medical advance directive? No - Patient declined  Copy of Healthcare Power of Attorney in Chart? Yes - validated most recent copy scanned in chart (See row information)  Pre-existing out of facility DNR order (yellow form or pink MOST form) Pink MOST form placed in chart (order not valid for inpatient use);Yellow form placed in chart (order not valid for inpatient use)     Chief Complaint  Patient presents with   Care Management    HPI: Patient is a 89 y.o. female seen today for medical management of chronic diseases.    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 Diet now with HTL No Coughing Or SOB Her weight is stable Wt Readings from Last 3 Encounters:  09/03/23 126 lb (57.2 kg)  07/20/23 124 lb 12.8 oz (56.6 kg)  05/22/23 126 lb 12.8 oz (57.5 kg)     Past Medical  History:  Diagnosis Date   Actinic keratosis 05/25/2014   Anxiety    Atrial fibrillation (HCC) 09/21/2010   Basal cell carcinoma 05/25/2014   Multiple removed by Dr. Arminda Resides in March 2015: right neck, left neck, scalp    Bell's palsy 07/18/1979   Cervicalgia 01/31/2012   Closed fracture of lumbar vertebra without mention of spinal cord injury 07/17/2005   Conjunctiva disorder 12/26/2010   Coronary atherosclerosis of native coronary artery 07/17/1997   Cramp of limb 08/16/2009   Degeneration of lumbar or lumbosacral intervertebral disc 01/31/2012   Disturbance of skin sensation 08/2009   Dizziness and giddiness 11/08/2011   vertigo   External hemorrhoids without mention of complication 06/27/2010   External hemorrhoids without mention of complication 08/26/2012   Ganglion of tendon sheath 08/16/2009   Leg cramp 10/27/2013   Most frequently the right leg.    Long term (current) use of anticoagulants 09/2010   Lumbago 07/2009   Meralgia paresthetica 07/2007   MI, old    Myalgia and myositis, unspecified 01/17/2012   Osteoporosis    Other abnormal blood chemistry 04/08/2012   Other abnormal blood chemistry 2013   hyperglycemia   Other disorder of muscle, ligament, and fascia 04/08/2012   Other specified cardiac dysrhythmias(427.89) 06/27/2010   Pacemaker 05/06/2020   Pain in joint, ankle and foot 10/27/2013   Bilateral since 1998    Pain in  joint, shoulder region 08/12/2012   Pain in joint, upper arm 12/26/2010   Pain in limb 01/11/2011   Pain in thoracic spine 01/31/2012   Pathologic fracture of vertebrae 01/22/2012   PVC's (premature ventricular contractions)    Rash and other nonspecific skin eruption 08/02/2011   Senile osteoporosis 07/17/1993   Stroke (HCC)    Unspecified essential hypertension 07/17/1997   Varicose veins of lower extremities 08/16/2009   Varicose veins of lower extremities 08/16/2009    Past Surgical History:  Procedure Laterality Date    COLONOSCOPY WITH PROPOFOL N/A 10/19/2015   Procedure: COLONOSCOPY WITH PROPOFOL;  Surgeon: Iva Boop, MD;  Location: [facility];  Service: Endoscopy;  Laterality: N/A;   CORONARY ARTERY BYPASS GRAFT  1998   x2; LIMA to LAD; SVG to diagonal off bypass   HEMORRHOID SURGERY  08/26/2012   Dr. Luisa Hart   LOOP RECORDER INSERTION N/A 05/13/2018   Procedure: LOOP RECORDER INSERTION;  Surgeon: Marinus Maw, MD;  Location[facility] INVASIVE CV LAB;  Service: Cardiovascular;  Laterality: N/A;   LOOP RECORDER REMOVAL N/A 05/26/2019   Procedure: LOOP RECORDER REMOVAL;  Surgeon: Marinus Maw, MD;  Location: [facility] INVASIVE CV LAB;  Service: Cardiovascular;  Laterality: N/A;   MASS EXCISION Left 11/23/2015   Procedure: LEFT WRIST EXCISION CYST;  Surgeon: Betha Loa, MD;  Location: [facility];  Service: Orthopedics;  Laterality: Left;   PACEMAKER IMPLANT N/A 05/26/2019   Procedure: PACEMAKER IMPLANT;  Surgeon: Marinus Maw, MD;  Location: [facility] INVASIVE CV LAB;  Service: Cardiovascular;  Laterality: N/A;   SKIN BIOPSY  01/29/14   (R) neck; (R) scalp, 2 (L) neck; shave biopsy Superficial basal cell carcinoma Dr. Arminda Resides   TONSILLECTOMY  [phone]    Allergies  Allergen Reactions   Iodinated Contrast Media Nausea Only and Other (See Comments)    Severe nausea and also passed out   Iodine Nausea Only and Other (See Comments)    "Allergic," per MAR- PASSED OUT   Lodine [Etodolac] Other (See Comments)    "Allergic," per [facility]   Other Other (See Comments)    Seasonal Allergies   Bactrim Rash   Relafen [Nabumetone] Rash   Sulfamethoxazole-Trimethoprim Rash    In matrix   Sulfanilamide Rash    Outpatient Encounter Medications as of 09/03/2023  Medication Sig   acetaminophen (TYLENOL) 325 MG tablet Take 650 mg by mouth 3 (three) times daily as needed (for pain).   Amino Acids-Protein Hydrolys (PRO-STAT) LIQD Take 30 mLs by mouth every morning.   amLODipine (NORVASC) 5 MG tablet Take 5 mg  by mouth daily.   Artificial Saliva (BIOTENE DRY MOUTH MOISTURIZING) SOLN Use as directed 1-2 sprays in the mouth or throat 3 (three) times daily.   aspirin 81 MG chewable tablet Chew 1 tablet (81 mg total) by mouth daily.   atenolol (TENORMIN) 50 MG tablet Take 1 tablet (50 mg total) by mouth 2 (two) times daily.   Calcium Carb-Cholecalciferol (CALCIUM 500 + D3 PO) Take 1 capsule by mouth in the morning.   chlorhexidine (PERIDEX) 0.12 % solution Use as directed 15 mLs in the mouth or throat 2 (two) times daily.   diclofenac Sodium (VOLTAREN) 1 % GEL Apply 4 g topically as needed.   digoxin (LANOXIN) 0.125 MG tablet Take 1 tablet (0.125 mg total) by mouth every other day.   docusate (COLACE) 50 MG/5ML liquid Take 100 mg by mouth daily.   hydrALAZINE (APRESOLINE) 10 MG tablet Take 10 mg  by mouth as needed (SBP>180).   ipratropium (ATROVENT HFA) 17 MCG/ACT inhaler Inhale 2 puffs into the lungs every morning.   levocetirizine (XYZAL) 5 MG tablet Take 5 mg by mouth at bedtime.   Multiple Vitamins-Minerals (MULTIVITAMIN WITH IRON-MINERALS) liquid Take 15 mLs by mouth daily.   polyethylene glycol (MIRALAX / GLYCOLAX) 17 g packet Take 17 g by mouth See admin instructions. Mix 17 grams of powder into 4-6 oz of beverage of choice and drink once a day   promethazine (PHENERGAN) 12.5 MG suppository Place 12.5 mg rectally every 6 (six) hours as needed for nausea or vomiting. 12.5 mg, rectal, Every 6 Hours - PRN, Promethazine suppository 12.5 mg every 6 hours as needed if vomiting or cannot take PO.   sodium chloride (OCEAN) 0.65 % nasal spray Place 1 spray into the nose 2 (two) times daily as needed for congestion.   No facility-administered encounter medications on file as of 09/03/2023.    Review of Systems:  Review of Systems  Unable to perform ROS: Other    Health Maintenance  Topic Date Due   DTaP/Tdap/Td (2 - Tdap) 10/06/2017   COVID-19 Vaccine (3 - Moderna risk series) 01/13/2020   Medicare  Annual Wellness (AWV)  09/27/2022   INFLUENZA VACCINE  02/04/2024 (Originally 06/07/2023)   Pneumonia Vaccine 51+ Years old  Completed   DEXA SCAN  Completed   Zoster Vaccines- Shingrix  Completed   HPV VACCINES  Aged Out    Physical Exam: Vitals:   09/03/23 1451  BP: (!) 146/85  Pulse: 75  Resp: 15  Temp: (!) 97.5 F (36.4 C)  SpO2: 91%  Weight: 126 lb (57.2 kg)   Body mass index is 20.34 kg/m. Physical Exam Vitals reviewed.  Constitutional:      Appearance: Normal appearance.  HENT:     Head: Normocephalic.     Nose: Nose normal.     Mouth/Throat:     Mouth: Mucous membranes are moist.     Pharynx: Oropharynx is clear.  Eyes:     Pupils: Pupils are equal, round, and reactive to light.  Cardiovascular:     Rate and Rhythm: Normal rate and regular rhythm.     Pulses: Normal pulses.     Heart sounds: Normal heart sounds. No murmur heard. Pulmonary:     Effort: Pulmonary effort is normal.     Breath sounds: Normal breath sounds.  Abdominal:     General: Abdomen is flat. Bowel sounds are normal.     Palpations: Abdomen is soft.  Musculoskeletal:        General: No swelling.     Cervical back: Neck supple.  Skin:    General: Skin is warm.  Neurological:     Mental Status: She is alert.     Comments: Left side Hemiplegia with No movement And has neglect  Psychiatric:        Mood and Affect: Mood normal.        Thought Content: Thought content normal.     Labs reviewed: Basic Metabolic Panel: Recent Labs    09/30/22 0931 10/01/22 0806 10/02/22 0900 10/21/22 0000 05/10/23 0000 05/14/23 0000 05/16/23 0000 05/18/23 0000  NA 141 142 142   < > 150* 150* 147  --   K 3.6 3.3* 3.9   < > 4.0 4.6 4.5  --   CL 108 111 116*   < > 116* 114* 111*  --   CO2 22 20* 19*   < > 26* 23*  18  --   GLUCOSE 107* 114* 108*  --   --   --   --   --   BUN 34* 36* 35*   < > 55* 43* 44*  --   CREATININE 1.25* 1.21* 1.09*   < > 1.3* 1.2* 1.2*  --   CALCIUM 8.5* 8.2* 8.2*   < >  8.5* 8.6* 9.0 8.6*   < > = values in this interval not displayed.   Liver Function Tests: Recent Labs    09/26/22 1539 10/21/22 0000 01/02/23 0000 02/02/23 0000 05/07/23 0000  AST 17   < > 21 21 24   ALT 12   < > 21 17 16   ALKPHOS 71   < > 80 83 57  BILITOT 0.3  --   --   --   --   PROT 7.1  --   --   --   --   ALBUMIN 3.1*   < > 3.1* 3.4* 3.3*   < > = values in this interval not displayed.   No results for input(s): "LIPASE", "AMYLASE" in the last 8760 hours. No results for input(s): "AMMONIA" in the last 8760 hours. CBC: Recent Labs    09/30/22 0931 10/01/22 0806 10/02/22 0900 10/09/22 0000 02/02/23 0000 02/21/23 0000 05/07/23 0000 05/21/23 0000  WBC 10.7* 11.0* 9.9   < > 9.1 9.3 12.4 14.4  NEUTROABS  --   --   --    < > 5.70 5.00 7.90  --   HGB 9.2* 8.5* 8.8*   < > 9.2* 10.5* 10.6* 8.9*  HCT 28.9* 25.7* 27.2*   < > 30* 32* 32* 29*  MCV 88.1 87.1 86.6  --   --   --   --   --   PLT 96* 87* 109*   < > 182 163 148* 124*   < > = values in this interval not displayed.   Lipid Panel: Recent Labs    09/27/22 0546  CHOL 161  HDL 39*  LDLCALC 114*  TRIG 39  CHOLHDL 4.1   Lab Results  Component Value Date   HGBA1C 5.7 (H) 09/26/2022    Procedures since last visit: CUP PACEART REMOTE DEVICE CHECK  Result Date: 08/31/2023 Scheduled remote reviewed. Normal device function.  Known AF, OAC contraindicated per EPIC Next remote 91 days. LA, CVRS   Assessment/Plan 1. 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 Oropharyngeal dysphagia On D 1 with HTL  3. Permanent atrial fibrillation (HCC) Atenolol and Digoxin Not on Anticoagulation due to h/o Bleed   4. Essential hypertension Amlodipine and Atenolol      Labs/tests ordered:  CMP,CBC Next Lab Draw Next appt:  Visit date not found

## 2023-09-04 DIAGNOSIS — I69391 Dysphagia following cerebral infarction: Secondary | ICD-10-CM | POA: Diagnosis not present

## 2023-09-04 DIAGNOSIS — I69322 Dysarthria following cerebral infarction: Secondary | ICD-10-CM | POA: Diagnosis not present

## 2023-09-04 DIAGNOSIS — R7989 Other specified abnormal findings of blood chemistry: Secondary | ICD-10-CM | POA: Diagnosis not present

## 2023-09-04 DIAGNOSIS — R1312 Dysphagia, oropharyngeal phase: Secondary | ICD-10-CM | POA: Diagnosis not present

## 2023-09-05 DIAGNOSIS — I69354 Hemiplegia and hemiparesis following cerebral infarction affecting left non-dominant side: Secondary | ICD-10-CM | POA: Diagnosis not present

## 2023-09-05 DIAGNOSIS — I6521 Occlusion and stenosis of right carotid artery: Secondary | ICD-10-CM | POA: Diagnosis not present

## 2023-09-05 DIAGNOSIS — R278 Other lack of coordination: Secondary | ICD-10-CM | POA: Diagnosis not present

## 2023-09-05 DIAGNOSIS — I6621 Occlusion and stenosis of right posterior cerebral artery: Secondary | ICD-10-CM | POA: Diagnosis not present

## 2023-09-05 DIAGNOSIS — M6389 Disorders of muscle in diseases classified elsewhere, multiple sites: Secondary | ICD-10-CM | POA: Diagnosis not present

## 2023-09-06 DIAGNOSIS — I69322 Dysarthria following cerebral infarction: Secondary | ICD-10-CM | POA: Diagnosis not present

## 2023-09-06 DIAGNOSIS — R1312 Dysphagia, oropharyngeal phase: Secondary | ICD-10-CM | POA: Diagnosis not present

## 2023-09-06 DIAGNOSIS — I69391 Dysphagia following cerebral infarction: Secondary | ICD-10-CM | POA: Diagnosis not present

## 2023-09-10 DIAGNOSIS — R1312 Dysphagia, oropharyngeal phase: Secondary | ICD-10-CM | POA: Diagnosis not present

## 2023-09-10 DIAGNOSIS — I69322 Dysarthria following cerebral infarction: Secondary | ICD-10-CM | POA: Diagnosis not present

## 2023-09-10 DIAGNOSIS — I69391 Dysphagia following cerebral infarction: Secondary | ICD-10-CM | POA: Diagnosis not present

## 2023-09-11 DIAGNOSIS — R1312 Dysphagia, oropharyngeal phase: Secondary | ICD-10-CM | POA: Diagnosis not present

## 2023-09-11 DIAGNOSIS — R278 Other lack of coordination: Secondary | ICD-10-CM | POA: Diagnosis not present

## 2023-09-11 DIAGNOSIS — I6521 Occlusion and stenosis of right carotid artery: Secondary | ICD-10-CM | POA: Diagnosis not present

## 2023-09-11 DIAGNOSIS — M6389 Disorders of muscle in diseases classified elsewhere, multiple sites: Secondary | ICD-10-CM | POA: Diagnosis not present

## 2023-09-11 DIAGNOSIS — I69391 Dysphagia following cerebral infarction: Secondary | ICD-10-CM | POA: Diagnosis not present

## 2023-09-11 DIAGNOSIS — I6621 Occlusion and stenosis of right posterior cerebral artery: Secondary | ICD-10-CM | POA: Diagnosis not present

## 2023-09-11 DIAGNOSIS — I69322 Dysarthria following cerebral infarction: Secondary | ICD-10-CM | POA: Diagnosis not present

## 2023-09-11 DIAGNOSIS — I69354 Hemiplegia and hemiparesis following cerebral infarction affecting left non-dominant side: Secondary | ICD-10-CM | POA: Diagnosis not present

## 2023-09-13 DIAGNOSIS — I69391 Dysphagia following cerebral infarction: Secondary | ICD-10-CM | POA: Diagnosis not present

## 2023-09-13 DIAGNOSIS — R1312 Dysphagia, oropharyngeal phase: Secondary | ICD-10-CM | POA: Diagnosis not present

## 2023-09-13 DIAGNOSIS — I69322 Dysarthria following cerebral infarction: Secondary | ICD-10-CM | POA: Diagnosis not present

## 2023-09-17 DIAGNOSIS — R1312 Dysphagia, oropharyngeal phase: Secondary | ICD-10-CM | POA: Diagnosis not present

## 2023-09-17 DIAGNOSIS — I69322 Dysarthria following cerebral infarction: Secondary | ICD-10-CM | POA: Diagnosis not present

## 2023-09-17 DIAGNOSIS — I69391 Dysphagia following cerebral infarction: Secondary | ICD-10-CM | POA: Diagnosis not present

## 2023-09-17 NOTE — Progress Notes (Signed)
Remote pacemaker transmission.   

## 2023-09-18 DIAGNOSIS — R1312 Dysphagia, oropharyngeal phase: Secondary | ICD-10-CM | POA: Diagnosis not present

## 2023-09-18 DIAGNOSIS — I69391 Dysphagia following cerebral infarction: Secondary | ICD-10-CM | POA: Diagnosis not present

## 2023-09-18 DIAGNOSIS — I69322 Dysarthria following cerebral infarction: Secondary | ICD-10-CM | POA: Diagnosis not present

## 2023-09-19 DIAGNOSIS — R278 Other lack of coordination: Secondary | ICD-10-CM | POA: Diagnosis not present

## 2023-09-19 DIAGNOSIS — I69354 Hemiplegia and hemiparesis following cerebral infarction affecting left non-dominant side: Secondary | ICD-10-CM | POA: Diagnosis not present

## 2023-09-19 DIAGNOSIS — M6389 Disorders of muscle in diseases classified elsewhere, multiple sites: Secondary | ICD-10-CM | POA: Diagnosis not present

## 2023-09-19 DIAGNOSIS — I6621 Occlusion and stenosis of right posterior cerebral artery: Secondary | ICD-10-CM | POA: Diagnosis not present

## 2023-09-19 DIAGNOSIS — I6521 Occlusion and stenosis of right carotid artery: Secondary | ICD-10-CM | POA: Diagnosis not present

## 2023-09-20 DIAGNOSIS — R1312 Dysphagia, oropharyngeal phase: Secondary | ICD-10-CM | POA: Diagnosis not present

## 2023-09-20 DIAGNOSIS — I69322 Dysarthria following cerebral infarction: Secondary | ICD-10-CM | POA: Diagnosis not present

## 2023-09-20 DIAGNOSIS — I69391 Dysphagia following cerebral infarction: Secondary | ICD-10-CM | POA: Diagnosis not present

## 2023-09-24 DIAGNOSIS — R1312 Dysphagia, oropharyngeal phase: Secondary | ICD-10-CM | POA: Diagnosis not present

## 2023-09-24 DIAGNOSIS — I69322 Dysarthria following cerebral infarction: Secondary | ICD-10-CM | POA: Diagnosis not present

## 2023-09-24 DIAGNOSIS — I69391 Dysphagia following cerebral infarction: Secondary | ICD-10-CM | POA: Diagnosis not present

## 2023-09-25 DIAGNOSIS — I69391 Dysphagia following cerebral infarction: Secondary | ICD-10-CM | POA: Diagnosis not present

## 2023-09-25 DIAGNOSIS — R1312 Dysphagia, oropharyngeal phase: Secondary | ICD-10-CM | POA: Diagnosis not present

## 2023-09-25 DIAGNOSIS — I69322 Dysarthria following cerebral infarction: Secondary | ICD-10-CM | POA: Diagnosis not present

## 2023-09-27 DIAGNOSIS — I69322 Dysarthria following cerebral infarction: Secondary | ICD-10-CM | POA: Diagnosis not present

## 2023-09-27 DIAGNOSIS — R1312 Dysphagia, oropharyngeal phase: Secondary | ICD-10-CM | POA: Diagnosis not present

## 2023-09-27 DIAGNOSIS — I69391 Dysphagia following cerebral infarction: Secondary | ICD-10-CM | POA: Diagnosis not present

## 2023-09-28 DIAGNOSIS — R1312 Dysphagia, oropharyngeal phase: Secondary | ICD-10-CM | POA: Diagnosis not present

## 2023-09-28 DIAGNOSIS — I69391 Dysphagia following cerebral infarction: Secondary | ICD-10-CM | POA: Diagnosis not present

## 2023-09-28 DIAGNOSIS — I69322 Dysarthria following cerebral infarction: Secondary | ICD-10-CM | POA: Diagnosis not present

## 2023-10-01 ENCOUNTER — Non-Acute Institutional Stay (SKILLED_NURSING_FACILITY): Payer: Medicare Other | Admitting: Adult Health

## 2023-10-01 ENCOUNTER — Encounter: Payer: Self-pay | Admitting: Adult Health

## 2023-10-01 DIAGNOSIS — D696 Thrombocytopenia, unspecified: Secondary | ICD-10-CM | POA: Diagnosis not present

## 2023-10-01 DIAGNOSIS — I69322 Dysarthria following cerebral infarction: Secondary | ICD-10-CM | POA: Diagnosis not present

## 2023-10-01 DIAGNOSIS — M62838 Other muscle spasm: Secondary | ICD-10-CM | POA: Diagnosis not present

## 2023-10-01 DIAGNOSIS — I69391 Dysphagia following cerebral infarction: Secondary | ICD-10-CM | POA: Diagnosis not present

## 2023-10-01 DIAGNOSIS — D509 Iron deficiency anemia, unspecified: Secondary | ICD-10-CM

## 2023-10-01 DIAGNOSIS — R1312 Dysphagia, oropharyngeal phase: Secondary | ICD-10-CM | POA: Diagnosis not present

## 2023-10-01 DIAGNOSIS — I4821 Permanent atrial fibrillation: Secondary | ICD-10-CM | POA: Diagnosis not present

## 2023-10-01 DIAGNOSIS — I69354 Hemiplegia and hemiparesis following cerebral infarction affecting left non-dominant side: Secondary | ICD-10-CM

## 2023-10-01 DIAGNOSIS — I10 Essential (primary) hypertension: Secondary | ICD-10-CM

## 2023-10-01 NOTE — Progress Notes (Unsigned)
Location:  [facility] Room Number: 118A Place of Service:  SNF [phone]) Provider:  Tamsen Roers, MD  Patient Care Team: Mahlon Gammon, MD as PCP - General (Internal Medicine) Marinus Maw, MD as PCP - Electrophysiology (Cardiology) Mathews Robinsons, MD as Consulting Physician (Dermatology) Marinus Maw, MD as Consulting Physician (Cardiology) [facility] Jeanella Craze, MD as Consulting Physician (Dermatology)  Extended Emergency Contact Information Primary Emergency Contact: Lynnell Grain of [place] Home Phone: [phone] Mobile Phone: [phone] Relation: Daughter Secondary Emergency Contact: Claud Kelp of [place] Home Phone: [phone] Mobile Phone: [phone] Relation: Son  Code Status:  DNR Goals of care: Advanced Directive information    10/01/2023    4:22 PM  Advanced Directives  Does Patient Have a Medical Advance Directive? Yes  Type of Estate agent of [place];Out of facility DNR (pink MOST or yellow form)  Does patient want to make changes to medical advance directive? No - Patient declined  Copy of Healthcare Power of Attorney in Chart? Yes - validated most recent copy scanned in chart (See row information)     Chief Complaint  Patient presents with  . Acute Visit    Patient is being seen for HTN    HPI:  Pt is a 89 y.o. female seen today for an acute visit for HTN and assessment of status  The nurse reports that Ms. Gal is telling people goodbye, thank you, and indicates that she may be dying.   BPs reviewed  Blood Pressure: 167 / 93 mmHg  Blood Pressure: 167 / 93 mmHg  Blood Pressure: 166 / 82 mmHg  Blood Pressure: 166 / 82 mmHg   Blood Pressure: 161 / 94 mmHg  Blood Pressure: 140 / 82 mmHg  There are is some concern that her intake is lower but her weight is checked monthly and is stable  Intakes  are variable 1-25 % 76-100%  depending on the meal.   Also she has some right sided "spasms" or pain that are intermittent.   No fever, sob, dysuria, or other symptoms.   Past Medical History:  Diagnosis Date  . Actinic keratosis 05/25/2014  . Anxiety   . Atrial fibrillation (HCC) 09/21/2010  . Basal cell carcinoma 05/25/2014   Multiple removed by Dr. Arminda Resides in March 2015: right neck, left neck, scalp   . Bell's palsy 07/18/1979  . Cervicalgia 01/31/2012  . Closed fracture of lumbar vertebra without mention of spinal cord injury 07/17/2005  . Conjunctiva disorder 12/26/2010  . Coronary atherosclerosis of native coronary artery 07/17/1997  . Cramp of limb 08/16/2009  . Degeneration of lumbar or lumbosacral intervertebral disc 01/31/2012  . Disturbance of skin sensation 08/2009  . Dizziness and giddiness 11/08/2011   vertigo  . External hemorrhoids without mention of complication 06/27/2010  . External hemorrhoids without mention of complication 08/26/2012  . Ganglion of tendon sheath 08/16/2009  . Leg cramp 10/27/2013   Most frequently the right leg.   . Long term (current) use of anticoagulants 09/2010  . Lumbago 07/2009  . Meralgia paresthetica 07/2007  . MI, old   . Myalgia and myositis, unspecified 01/17/2012  . Osteoporosis   . Other abnormal blood chemistry 04/08/2012  . Other abnormal blood chemistry 2013   hyperglycemia  . Other disorder of muscle, ligament, and fascia 04/08/2012  . Other specified cardiac dysrhythmias(427.89) 06/27/2010  . Pacemaker 05/06/2020  . Pain in joint, ankle and  foot 10/27/2013   Bilateral since 1998   . Pain in joint, shoulder region 08/12/2012  . Pain in joint, upper arm 12/26/2010  . Pain in limb 01/11/2011  . Pain in thoracic spine 01/31/2012  . 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 PVC's (premature ventricular contractions)   . Rash and other nonspecific skin eruption 08/02/2011  . Senile osteoporosis 07/17/1993   . Stroke (HCC)   . Unspecified essential hypertension 07/17/1997  . Varicose veins of lower extremities 08/16/2009  . Varicose veins of lower extremities 08/16/2009   Past Surgical History:  Procedure Laterality Date  . COLONOSCOPY WITH PROPOFOL N/A 10/19/2015   Procedure: COLONOSCOPY WITH PROPOFOL;  Surgeon: Iva Boop, MD;  Location: [facility];  Service: Endoscopy;  Laterality: N/A;  . CORONARY ARTERY BYPASS GRAFT  1998   x2; LIMA to LAD; SVG to diagonal off bypass  . HEMORRHOID SURGERY  08/26/2012   Dr. Luisa Hart  . LOOP RECORDER INSERTION N/A 05/13/2018   Procedure: LOOP RECORDER INSERTION;  Surgeon: Marinus Maw, MD;  Location: [facility] INVASIVE CV LAB;  Service: Cardiovascular;  Laterality: N/A;  . LOOP RECORDER REMOVAL N/A 05/26/2019   Procedure: LOOP RECORDER REMOVAL;  Surgeon: Marinus Maw, MD;  Location: [facility] INVASIVE CV LAB;  Service: Cardiovascular;  Laterality: N/A;  . MASS EXCISION Left 11/23/2015   Procedure: LEFT WRIST EXCISION CYST;  Surgeon: Betha Loa, MD;  Location: [facility];  Service: Orthopedics;  Laterality: Left;  . PACEMAKER IMPLANT N/A 05/26/2019   Procedure: PACEMAKER IMPLANT;  Surgeon: Marinus Maw, MD;  Location: [facility] INVASIVE CV LAB;  Service: Cardiovascular;  Laterality: N/A;  . SKIN BIOPSY  01/29/14   (R) neck; (R) scalp, 2 (L) neck; shave biopsy Superficial basal cell carcinoma Dr. Arminda Resides  . TONSILLECTOMY  1940's    Allergies  Allergen Reactions  . Iodinated Contrast Media Nausea Only and Other (See Comments)    Severe nausea and also passed out  . Iodine Nausea Only and Other (See Comments)    "Allergic," per MAR- PASSED OUT  . Lodine [Etodolac] Other (See Comments)    "Allergic," per MAR  . Other Other (See Comments)    Seasonal Allergies  . Bactrim Rash  . Relafen [Nabumetone] Rash  . Sulfamethoxazole-Trimethoprim Rash    In matrix  . Sulfanilamide Rash    Outpatient Encounter Medications as of 10/01/2023   Medication Sig  . acetaminophen (TYLENOL) 325 MG tablet Take 650 mg by mouth 3 (three) times daily as needed (for pain).  . Amino Acids-Protein Hydrolys (PRO-STAT) LIQD Take 30 mLs by mouth every morning.  Marland Kitchen amLODipine (NORVASC) 5 MG tablet Take 5 mg by mouth daily.  . Artificial Saliva (BIOTENE DRY MOUTH MOISTURIZING) SOLN Use as directed 1-2 sprays in the mouth or throat 3 (three) times daily.  Marland Kitchen aspirin 81 MG chewable tablet Chew 1 tablet (81 mg total) by mouth daily.  Marland Kitchen atenolol (TENORMIN) 50 MG tablet Take 1 tablet (50 mg total) by mouth 2 (two) times daily.  . Calcium Carb-Cholecalciferol (CALCIUM 500 + D3 PO) Take 1 capsule by mouth in the morning.  . chlorhexidine (PERIDEX) 0.12 % solution Use as directed 15 mLs in the mouth or throat 2 (two) times daily.  . diclofenac Sodium (VOLTAREN) 1 % GEL Apply 4 g topically as needed.  . digoxin (LANOXIN) 0.125 MG tablet Take 1 tablet (0.125 mg total) by mouth every other day.  . docusate (COLACE) 50 MG/5ML liquid Take 100 mg by mouth  daily.  . hydrALAZINE (APRESOLINE) 10 MG tablet Take 10 mg by mouth as needed (SBP>180).  Marland Kitchen ipratropium (ATROVENT HFA) 17 MCG/ACT inhaler Inhale 2 puffs into the lungs every morning.  Marland Kitchen levocetirizine (XYZAL) 5 MG tablet Take 5 mg by mouth at bedtime.  . Multiple Vitamins-Minerals (MULTIVITAMIN WITH IRON-MINERALS) liquid Take 15 mLs by mouth daily.  . polyethylene glycol (MIRALAX / GLYCOLAX) 17 g packet Take 17 g by mouth See admin instructions. Mix 17 grams of powder into 4-6 oz of beverage of choice and drink once a day  . promethazine (PHENERGAN) 12.5 MG suppository Place 12.5 mg rectally every 6 (six) hours as needed for nausea or vomiting. 12.5 mg, rectal, Every 6 Hours - PRN, Promethazine suppository 12.5 mg every 6 hours as needed if vomiting or cannot take PO.  . sodium chloride (OCEAN) 0.65 % nasal spray Place 1 spray into the nose 2 (two) times daily as needed for congestion.   No facility-administered  encounter medications on file as of 10/01/2023.    Review of Systems  Immunization History  Administered Date(s) Administered  . Influenza Whole 08/06/2012  . Influenza, High Dose Seasonal PF 08/15/2019, 08/27/2020  . Influenza,inj,Quad PF,6+ Mos 08/30/2018  . Influenza-Unspecified 08/06/2013, 08/24/2014, 08/26/2015, 08/31/2016, 08/27/2017, 08/27/2020  . Moderna Sars-Covid-2 Vaccination 11/16/2019, 12/16/2019  . Pneumococcal Conjugate-13 09/22/2015  . Pneumococcal Polysaccharide-23 08/29/2006  . Td 10/07/2007  . Zoster Recombinant(Shingrix) 02/12/2018, 05/13/2018  . Zoster, Live 03/12/2008   Pertinent  Health Maintenance Due  Topic Date Due  . INFLUENZA VACCINE  02/04/2024 (Originally 06/07/2023)  . DEXA SCAN  Completed      10/02/2022    8:00 AM 10/03/2022   10:49 AM 10/04/2022    9:50 AM 10/06/2022   10:31 AM 12/01/2022    9:28 AM  Fall Risk  Falls in the past year?  1 1 1 1   Was there an injury with Fall?  1 1 1 1   Fall Risk Category Calculator  3 3 2 2   Fall Risk Category (Retired)  High High Moderate   (RETIRED) Patient Fall Risk Level High fall risk High fall risk High fall risk High fall risk   Patient at Risk for Falls Due to  History of fall(s) History of fall(s) History of fall(s) History of fall(s)  Fall risk Follow up  Falls evaluation completed Falls evaluation completed Falls evaluation completed Falls evaluation completed   Functional Status Survey:    Vitals:   10/01/23 1558  BP: (!) 167/93  Pulse: 66  Resp: 17  Temp: 98 F (36.7 C)  TempSrc: Temporal  SpO2: 98%  Weight: 130 lb 6.4 oz (59.1 kg)  Height: 5\' 6"  (1.676 m)   Body mass index is 21.05 kg/m. Physical Exam  Labs reviewed: Recent Labs    10/02/22 0900 10/21/22 0000 05/10/23 0000 05/14/23 0000 05/16/23 0000 05/18/23 0000  NA 142   < > 150* 150* 147  --   K 3.9   < > 4.0 4.6 4.5  --   CL 116*   < > 116* 114* 111*  --   CO2 19*   < > 26* 23* 18  --   GLUCOSE 108*  --   --   --    --   --   BUN 35*   < > 55* 43* 44*  --   CREATININE 1.09*   < > 1.3* 1.2* 1.2*  --   CALCIUM 8.2*   < > 8.5* 8.6* 9.0 8.6*   < > =  values in this interval not displayed.   Recent Labs    01/02/23 0000 02/02/23 0000 05/07/23 0000  AST 21 21 24   ALT 21 17 16   ALKPHOS 80 83 57  ALBUMIN 3.1* 3.4* 3.3*   Recent Labs    10/02/22 0900 10/09/22 0000 02/02/23 0000 02/21/23 0000 05/07/23 0000 05/21/23 0000  WBC 9.9   < > 9.1 9.3 12.4 14.4  NEUTROABS  --    < > 5.70 5.00 7.90  --   HGB 8.8*   < > 9.2* 10.5* 10.6* 8.9*  HCT 27.2*   < > 30* 32* 32* 29*  MCV 86.6  --   --   --   --   --   PLT 109*   < > 182 163 148* 124*   < > = values in this interval not displayed.   Lab Results  Component Value Date   TSH 2.87 02/03/2022   Lab Results  Component Value Date   HGBA1C 5.7 (H) 09/26/2022   Lab Results  Component Value Date   CHOL 161 09/27/2022   HDL 39 (L) 09/27/2022   LDLCALC 114 (H) 09/27/2022   LDLDIRECT 81.3 08/19/2020   TRIG 39 09/27/2022   CHOLHDL 4.1 09/27/2022    Significant Diagnostic Results in last 30 days:  No results found.  Assessment/Plan There are no diagnoses linked to this encounter.   Family/ staff Communication: ***  Labs/tests ordered:  ***

## 2023-10-02 ENCOUNTER — Encounter: Payer: Self-pay | Admitting: Adult Health

## 2023-10-02 DIAGNOSIS — B351 Tinea unguium: Secondary | ICD-10-CM | POA: Diagnosis not present

## 2023-10-02 DIAGNOSIS — M79671 Pain in right foot: Secondary | ICD-10-CM | POA: Diagnosis not present

## 2023-10-02 DIAGNOSIS — I69391 Dysphagia following cerebral infarction: Secondary | ICD-10-CM | POA: Diagnosis not present

## 2023-10-02 DIAGNOSIS — M79672 Pain in left foot: Secondary | ICD-10-CM | POA: Diagnosis not present

## 2023-10-02 DIAGNOSIS — R1312 Dysphagia, oropharyngeal phase: Secondary | ICD-10-CM | POA: Diagnosis not present

## 2023-10-02 DIAGNOSIS — L602 Onychogryphosis: Secondary | ICD-10-CM | POA: Diagnosis not present

## 2023-10-02 DIAGNOSIS — I69322 Dysarthria following cerebral infarction: Secondary | ICD-10-CM | POA: Diagnosis not present

## 2023-10-08 DIAGNOSIS — I69391 Dysphagia following cerebral infarction: Secondary | ICD-10-CM | POA: Diagnosis not present

## 2023-10-08 DIAGNOSIS — R1312 Dysphagia, oropharyngeal phase: Secondary | ICD-10-CM | POA: Diagnosis not present

## 2023-10-08 DIAGNOSIS — I69322 Dysarthria following cerebral infarction: Secondary | ICD-10-CM | POA: Diagnosis not present

## 2023-10-09 DIAGNOSIS — R1312 Dysphagia, oropharyngeal phase: Secondary | ICD-10-CM | POA: Diagnosis not present

## 2023-10-09 DIAGNOSIS — I69322 Dysarthria following cerebral infarction: Secondary | ICD-10-CM | POA: Diagnosis not present

## 2023-10-09 DIAGNOSIS — I69391 Dysphagia following cerebral infarction: Secondary | ICD-10-CM | POA: Diagnosis not present

## 2023-10-11 DIAGNOSIS — I69322 Dysarthria following cerebral infarction: Secondary | ICD-10-CM | POA: Diagnosis not present

## 2023-10-11 DIAGNOSIS — R1312 Dysphagia, oropharyngeal phase: Secondary | ICD-10-CM | POA: Diagnosis not present

## 2023-10-11 DIAGNOSIS — I69391 Dysphagia following cerebral infarction: Secondary | ICD-10-CM | POA: Diagnosis not present

## 2023-10-15 DIAGNOSIS — I69391 Dysphagia following cerebral infarction: Secondary | ICD-10-CM | POA: Diagnosis not present

## 2023-10-15 DIAGNOSIS — R1312 Dysphagia, oropharyngeal phase: Secondary | ICD-10-CM | POA: Diagnosis not present

## 2023-10-15 DIAGNOSIS — I69322 Dysarthria following cerebral infarction: Secondary | ICD-10-CM | POA: Diagnosis not present

## 2023-10-16 DIAGNOSIS — I69391 Dysphagia following cerebral infarction: Secondary | ICD-10-CM | POA: Diagnosis not present

## 2023-10-16 DIAGNOSIS — R1312 Dysphagia, oropharyngeal phase: Secondary | ICD-10-CM | POA: Diagnosis not present

## 2023-10-16 DIAGNOSIS — I69322 Dysarthria following cerebral infarction: Secondary | ICD-10-CM | POA: Diagnosis not present

## 2023-10-18 DIAGNOSIS — R1312 Dysphagia, oropharyngeal phase: Secondary | ICD-10-CM | POA: Diagnosis not present

## 2023-10-18 DIAGNOSIS — I69391 Dysphagia following cerebral infarction: Secondary | ICD-10-CM | POA: Diagnosis not present

## 2023-10-18 DIAGNOSIS — I69322 Dysarthria following cerebral infarction: Secondary | ICD-10-CM | POA: Diagnosis not present

## 2023-10-22 DIAGNOSIS — R1312 Dysphagia, oropharyngeal phase: Secondary | ICD-10-CM | POA: Diagnosis not present

## 2023-10-22 DIAGNOSIS — Z79899 Other long term (current) drug therapy: Secondary | ICD-10-CM | POA: Diagnosis not present

## 2023-10-22 DIAGNOSIS — I69391 Dysphagia following cerebral infarction: Secondary | ICD-10-CM | POA: Diagnosis not present

## 2023-10-22 DIAGNOSIS — I69322 Dysarthria following cerebral infarction: Secondary | ICD-10-CM | POA: Diagnosis not present

## 2023-10-23 DIAGNOSIS — I69391 Dysphagia following cerebral infarction: Secondary | ICD-10-CM | POA: Diagnosis not present

## 2023-10-23 DIAGNOSIS — R1312 Dysphagia, oropharyngeal phase: Secondary | ICD-10-CM | POA: Diagnosis not present

## 2023-10-23 DIAGNOSIS — I69322 Dysarthria following cerebral infarction: Secondary | ICD-10-CM | POA: Diagnosis not present

## 2023-10-25 DIAGNOSIS — R1312 Dysphagia, oropharyngeal phase: Secondary | ICD-10-CM | POA: Diagnosis not present

## 2023-10-25 DIAGNOSIS — I69391 Dysphagia following cerebral infarction: Secondary | ICD-10-CM | POA: Diagnosis not present

## 2023-10-25 DIAGNOSIS — I69322 Dysarthria following cerebral infarction: Secondary | ICD-10-CM | POA: Diagnosis not present

## 2023-11-12 ENCOUNTER — Non-Acute Institutional Stay (SKILLED_NURSING_FACILITY): Payer: Self-pay | Admitting: Internal Medicine

## 2023-11-12 ENCOUNTER — Encounter: Payer: Self-pay | Admitting: Internal Medicine

## 2023-11-12 DIAGNOSIS — I4821 Permanent atrial fibrillation: Secondary | ICD-10-CM | POA: Diagnosis not present

## 2023-11-12 DIAGNOSIS — R400 Somnolence: Secondary | ICD-10-CM | POA: Diagnosis not present

## 2023-11-12 DIAGNOSIS — I10 Essential (primary) hypertension: Secondary | ICD-10-CM

## 2023-11-12 DIAGNOSIS — Z79899 Other long term (current) drug therapy: Secondary | ICD-10-CM | POA: Diagnosis not present

## 2023-11-12 DIAGNOSIS — R1312 Dysphagia, oropharyngeal phase: Secondary | ICD-10-CM

## 2023-11-12 NOTE — Progress Notes (Signed)
 Location: Medical [facility] Service:  SNF (31)  Provider:   Code Status: DNR Goals of Care:     10/01/2023    4:22 PM  Advanced Directives  Does Patient Have a Medical Advance Directive? Yes  Type of Estate Agent of [place];Out of facility DNR (pink MOST or yellow form)  Does patient want to make changes to medical advance directive? No - Patient declined  Copy of Healthcare Power of Attorney in Chart? Yes - validated most recent copy scanned in chart (See row information)     Chief Complaint  Patient presents with   Acute Visit    HPI: Patient is a 89 y.o. female seen today for an acute visit for Worsening Aphasia and Pocketing Food and Somnolence  Lives in SNF in [place] Patient with h/o PAF with Previous strokes but off DOAC due to Bleeding She also has h/o Hypernatremia  She was doing well few days ago but for past 24 hours noticed to be not talking /Responding that well Also per Nurses she has been Picketing food and then spitting them put Daughter in the room Patient would respond to her daughter but not to me Would just stare Would not follow any commands  She needs Full ADL care Needs Deitra for transfers Has Complete Left Sided Neglect due to her previous CVA Wt Readings from Last 3 Encounters:  11/12/23 135 lb 9.6 oz (61.5 kg)  10/01/23 130 lb 6.4 oz (59.1 kg)  09/03/23 126 lb (57.2 kg)    She has h/o  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, likely embolic given afib not on AC with Left Hemiplegia and Neglect, Dysphagia,in 11/23  Patient also has a history of CAD,  thrombocytopenia  history of GI bleed on Coumadin and  history of ruptured Baker's cyst with hemorrhage on Eliquis  Also has history of hyperlipidemia and hyperglycemia  S/P PPM She had 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 Toribio Molt in March 2015: right neck, left neck, scalp    Bell's palsy 07/18/1979   Cervicalgia 01/31/2012   Closed fracture of lumbar vertebra without mention of spinal cord injury 07/17/2005   Conjunctiva disorder 12/26/2010   Coronary atherosclerosis of native coronary artery 07/17/1997   Cramp of limb 08/16/2009   Degeneration of lumbar or lumbosacral intervertebral disc 01/31/2012   Disturbance of skin sensation 08/2009   Dizziness and giddiness 11/08/2011   vertigo   External hemorrhoids without mention of complication 06/27/2010   External hemorrhoids without mention of complication 08/26/2012   Ganglion of tendon sheath 08/16/2009   Leg cramp 10/27/2013   Most frequently the right leg.    Long term (current) use of anticoagulants 09/2010   Lumbago 07/2009   Meralgia paresthetica 07/2007   MI, old    Myalgia and myositis, unspecified 01/17/2012   Osteoporosis    Other abnormal blood chemistry 04/08/2012   Other abnormal blood chemistry 2013   hyperglycemia   Other disorder of muscle, ligament, and fascia 04/08/2012   Other specified cardiac dysrhythmias(427.89) 06/27/2010   Pacemaker 05/06/2020   Pain in joint, ankle and foot 10/27/2013   Bilateral since 1998    Pain in joint, shoulder region 08/12/2012  Pain in joint, upper arm 12/26/2010   Pain in limb 01/11/2011   Pain in thoracic spine 01/31/2012   Pathologic fracture of vertebrae 01/22/2012   PVC's (premature ventricular contractions)    Rash and other nonspecific skin eruption 08/02/2011   Senile osteoporosis 07/17/1993   Stroke (HCC)    Unspecified essential hypertension 07/17/1997   Varicose veins of lower extremities 08/16/2009   Varicose veins of lower extremities 08/16/2009    Past Surgical History:  Procedure Laterality Date   COLONOSCOPY WITH PROPOFOL  N/A 10/19/2015    Procedure: COLONOSCOPY WITH PROPOFOL ;  Surgeon: Lupita FORBES Commander, MD;  Location: [facility];  Service: Endoscopy;  Laterality: N/A;   CORONARY ARTERY BYPASS GRAFT  1998   x2; LIMA to LAD; SVG to diagonal off bypass   HEMORRHOID SURGERY  08/26/2012   Dr. Vanderbilt   LOOP RECORDER INSERTION N/A 05/13/2018   Procedure: LOOP RECORDER INSERTION;  Surgeon: Waddell Danelle ORN, MD;  Location: [facility] INVASIVE CV LAB;  Service: Cardiovascular;  Laterality: N/A;   LOOP RECORDER REMOVAL N/A 05/26/2019   Procedure: LOOP RECORDER REMOVAL;  Surgeon: Waddell Danelle ORN, MD;  Location: [facility] INVASIVE CV LAB;  Service: Cardiovascular;  Laterality: N/A;   MASS EXCISION Left 11/23/2015   Procedure: LEFT WRIST EXCISION CYST;  Surgeon: Franky Curia, MD;  Location: [facility];  Service: Orthopedics;  Laterality: Left;   PACEMAKER IMPLANT N/A 05/26/2019   Procedure: PACEMAKER IMPLANT;  Surgeon: Waddell Danelle ORN, MD;  Location: [facility] INVASIVE CV LAB;  Service: Cardiovascular;  Laterality: N/A;   SKIN BIOPSY  01/29/14   (R) neck; (R) scalp, 2 (L) neck; shave biopsy Superficial basal cell carcinoma Dr. Toribio Molt   TONSILLECTOMY  [phone]    Allergies  Allergen Reactions   Iodinated Contrast Media Nausea Only and Other (See Comments)    Severe nausea and also passed out   Iodine Nausea Only and Other (See Comments)    Allergic, per MAR- PASSED OUT   Lodine [Etodolac] Other (See Comments)    Allergic, per [facility]   Other Other (See Comments)    Seasonal Allergies   Bactrim Rash   Relafen [Nabumetone] Rash   Sulfamethoxazole-Trimethoprim Rash    In matrix   Sulfanilamide Rash    Outpatient Encounter Medications as of 11/12/2023  Medication Sig   acetaminophen  (TYLENOL ) 325 MG tablet Take 650 mg by mouth 3 (three) times daily as needed (for pain).   Amino Acids-Protein Hydrolys (PRO-STAT) LIQD Take 30 mLs by mouth every morning.   amLODipine  (NORVASC ) 5 MG tablet Take 5 mg by mouth daily.   Artificial Saliva  (BIOTENE DRY MOUTH MOISTURIZING) SOLN Use as directed 1-2 sprays in the mouth or throat 3 (three) times daily.   aspirin  81 MG chewable tablet Chew 1 tablet (81 mg total) by mouth daily.   atenolol  (TENORMIN ) 50 MG tablet Take 1 tablet (50 mg total) by mouth 2 (two) times daily.   Calcium  Carb-Cholecalciferol (CALCIUM  500 + D3 PO) Take 1 capsule by mouth in the morning.   chlorhexidine  (PERIDEX ) 0.12 % solution Use as directed 15 mLs in the mouth or throat 2 (two) times daily.   diclofenac  Sodium (VOLTAREN ) 1 % GEL Apply 4 g topically as needed.   digoxin  (LANOXIN ) 0.125 MG tablet Take 1 tablet (0.125 mg total) by mouth every other day.   docusate (COLACE) 50 MG/5ML liquid Take 100 mg by mouth daily.   hydrALAZINE  (APRESOLINE ) 10 MG tablet Take 10 mg by mouth as needed (SBP>180).  ipratropium (ATROVENT HFA) 17 MCG/ACT inhaler Inhale 2 puffs into the lungs every morning.   levocetirizine (XYZAL) 5 MG tablet Take 5 mg by mouth at bedtime.   Multiple Vitamins-Minerals (MULTIVITAMIN WITH IRON-MINERALS) liquid Take 15 mLs by mouth daily.   polyethylene glycol (MIRALAX  / GLYCOLAX ) 17 g packet Take 17 g by mouth See admin instructions. Mix 17 grams of powder into 4-6 oz of beverage of choice and drink once a day   promethazine (PHENERGAN) 12.5 MG suppository Place 12.5 mg rectally every 6 (six) hours as needed for nausea or vomiting. 12.5 mg, rectal, Every 6 Hours - PRN, Promethazine suppository 12.5 mg every 6 hours as needed if vomiting or cannot take PO.   sodium chloride  (OCEAN) 0.65 % nasal spray Place 1 spray into the nose 2 (two) times daily as needed for congestion.   No facility-administered encounter medications on file as of 11/12/2023.    Review of Systems:  Review of Systems  Constitutional:  Positive for activity change and appetite change.  HENT: Negative.    Respiratory:  Negative for cough and shortness of breath.   Cardiovascular:  Negative for leg swelling.  Gastrointestinal:   Negative for constipation.  Genitourinary: Negative.   Musculoskeletal:  Positive for arthralgias and gait problem. Negative for myalgias.  Skin: Negative.   Neurological:  Negative for dizziness and weakness.  Psychiatric/Behavioral:  Positive for confusion. Negative for dysphoric mood and sleep disturbance.     Health Maintenance  Topic Date Due   DTaP/Tdap/Td (2 - Tdap) 10/06/2017   COVID-19 Vaccine (3 - Moderna risk series) 01/13/2020   Medicare Annual Wellness (AWV)  09/27/2022   INFLUENZA VACCINE  02/04/2024 (Originally 06/07/2023)   Pneumonia Vaccine 34+ Years old  Completed   DEXA SCAN  Completed   Zoster Vaccines- Shingrix  Completed   HPV VACCINES  Aged Out    Physical Exam: Vitals:   11/12/23 1626  BP: (!) 140/66  Pulse: 61  Resp: 17  Temp: 97.6 F (36.4 C)  Weight: 135 lb 9.6 oz (61.5 kg)   Body mass index is 21.89 kg/m. Physical Exam Vitals reviewed.  HENT:     Head: Normocephalic.     Nose: Nose normal.     Mouth/Throat:     Mouth: Mucous membranes are moist.     Pharynx: Oropharynx is clear.  Eyes:     Pupils: Pupils are equal, round, and reactive to light.  Cardiovascular:     Rate and Rhythm: Normal rate. Rhythm irregular.     Pulses: Normal pulses.     Heart sounds: Normal heart sounds. No murmur heard. Pulmonary:     Effort: Pulmonary effort is normal.     Breath sounds: Normal breath sounds.  Abdominal:     General: Abdomen is flat. Bowel sounds are normal.     Palpations: Abdomen is soft.  Musculoskeletal:        General: No swelling.     Cervical back: Neck supple.  Skin:    General: Skin is warm.  Neurological:     Mental Status: She is alert.     Comments: Expressive Aphasia Not Following Commands Right Gaze with Left Neglect  Psychiatric:        Mood and Affect: Mood normal.        Thought Content: Thought content normal.     Labs reviewed: Basic Metabolic Panel: Recent Labs    05/10/23 0000 05/14/23 0000 05/16/23 0000  05/18/23 0000  NA 150* 150* 147  --  K 4.0 4.6 4.5  --   CL 116* 114* 111*  --   CO2 26* 23* 18  --   BUN 55* 43* 44*  --   CREATININE 1.3* 1.2* 1.2*  --   CALCIUM  8.5* 8.6* 9.0 8.6*   Liver Function Tests: Recent Labs    01/02/23 0000 02/02/23 0000 05/07/23 0000  AST 21 21 24   ALT 21 17 16   ALKPHOS 80 83 57  ALBUMIN 3.1* 3.4* 3.3*   No results for input(s): LIPASE, AMYLASE in the last 8760 hours. No results for input(s): AMMONIA in the last 8760 hours. CBC: Recent Labs    02/02/23 0000 02/21/23 0000 05/07/23 0000 05/21/23 0000  WBC 9.1 9.3 12.4 14.4  NEUTROABS 5.70 5.00 7.90  --   HGB 9.2* 10.5* 10.6* 8.9*  HCT 30* 32* 32* 29*  PLT 182 163 148* 124*   Lipid Panel: No results for input(s): CHOL, HDL, LDLCALC, TRIG, CHOLHDL, LDLDIRECT in the last 8760 hours. Lab Results  Component Value Date   HGBA1C 5.7 (H) 09/26/2022    Procedures since last visit: No results found.  Assessment/Plan 1. Somnolence (Primary) with Expressive aphasia Discussed with daughter this could be another stroke Will Check Labs CBC,CMP stat for Hypernatremia   2. Oropharyngeal dysphagia Worsened over past few days with Pocketing Food Checking labs but can be another stroke  3. Permanent atrial fibrillation (HCC) On Metoprolol  and Digoxin  Not on any Anticoagulation  4. Essential hypertension Metoprolol   [place] All her Labs came back Normal Patient became more near Baseline later in night Possible TIA as she is not on Eliquis     Labs/tests ordered:  * No order type specified * Next appt:  Visit date not found

## 2023-11-30 ENCOUNTER — Encounter: Payer: Self-pay | Admitting: Internal Medicine

## 2023-11-30 ENCOUNTER — Ambulatory Visit (INDEPENDENT_AMBULATORY_CARE_PROVIDER_SITE_OTHER): Payer: Medicare Other

## 2023-11-30 ENCOUNTER — Non-Acute Institutional Stay (SKILLED_NURSING_FACILITY): Payer: Medicare Other | Admitting: Adult Health

## 2023-11-30 DIAGNOSIS — N3000 Acute cystitis without hematuria: Secondary | ICD-10-CM

## 2023-11-30 DIAGNOSIS — R5383 Other fatigue: Secondary | ICD-10-CM | POA: Diagnosis not present

## 2023-11-30 DIAGNOSIS — N390 Urinary tract infection, site not specified: Secondary | ICD-10-CM | POA: Diagnosis not present

## 2023-11-30 DIAGNOSIS — E871 Hypo-osmolality and hyponatremia: Secondary | ICD-10-CM | POA: Diagnosis not present

## 2023-11-30 DIAGNOSIS — E860 Dehydration: Secondary | ICD-10-CM | POA: Diagnosis not present

## 2023-11-30 DIAGNOSIS — I472 Ventricular tachycardia, unspecified: Secondary | ICD-10-CM

## 2023-11-30 LAB — CUP PACEART REMOTE DEVICE CHECK
Battery Remaining Longevity: 98 mo
Battery Remaining Percentage: 72 %
Battery Voltage: 2.99 V
Brady Statistic RV Percent Paced: 13 %
Date Time Interrogation Session: 20250124020013
Implantable Lead Connection Status: 753985
Implantable Lead Implant Date: 20200720
Implantable Lead Location: 753860
Implantable Pulse Generator Implant Date: 20200720
Lead Channel Impedance Value: 540 Ohm
Lead Channel Pacing Threshold Amplitude: 1 V
Lead Channel Pacing Threshold Pulse Width: 0.5 ms
Lead Channel Sensing Intrinsic Amplitude: 5.1 mV
Lead Channel Setting Pacing Amplitude: 2.5 V
Lead Channel Setting Pacing Pulse Width: 0.5 ms
Lead Channel Setting Sensing Sensitivity: 0.5 mV
Pulse Gen Model: 1272
Pulse Gen Serial Number: 9122450

## 2023-12-02 ENCOUNTER — Encounter: Payer: Self-pay | Admitting: Adult Health

## 2023-12-02 DIAGNOSIS — R918 Other nonspecific abnormal finding of lung field: Secondary | ICD-10-CM | POA: Diagnosis not present

## 2023-12-02 DIAGNOSIS — Z950 Presence of cardiac pacemaker: Secondary | ICD-10-CM | POA: Diagnosis not present

## 2023-12-02 MED ORDER — CEPHALEXIN 500 MG PO CAPS: 500.0000 mg | ORAL_CAPSULE | Freq: Three times a day (TID) | ORAL | Status: DC

## 2023-12-02 NOTE — Progress Notes (Signed)
Location:  Medical illustrator of Service:  SNF (31) Provider:   Peggye Ley, ANP [facility] [phone]   Mahlon Gammon, MD  Patient Care Team: Mahlon Gammon, MD as PCP - General (Internal Medicine) Marinus Maw, MD as PCP - Electrophysiology (Cardiology) Mathews Robinsons, MD as Consulting Physician (Dermatology) Marinus Maw, MD as Consulting Physician (Cardiology) [facility] Jeanella Craze, MD as Consulting Physician (Dermatology)  Extended Emergency Contact Information Primary Emergency Contact: Lynnell Grain of [place] Home Phone: [phone] Mobile Phone: [phone] Relation: Daughter Secondary Emergency Contact: Claud Kelp of [place] Home Phone: [phone] Mobile Phone: [phone] Relation: Son  Code Status:  DNR Goals of care: Advanced Directive information    10/01/2023    4:22 PM  Advanced Directives  Does Patient Have a Medical Advance Directive? Yes  Type of Estate agent of [place];Out of facility DNR (pink MOST or yellow form)  Does patient want to make changes to medical advance directive? No - Patient declined  Copy of Healthcare Power of Attorney in Chart? Yes - validated most recent copy scanned in chart (See row information)     Chief Complaint  Patient presents with   Acute Visit    F/u lethargy    HPI:  Pt is a 89 y.o. female seen today for an acute visit for lethargy  [id number] 10/02/22 and diagnosed with a right large PCA infarct s/p TNK. Left sided weakness, dysphagia, and dysarthria present.  Has a hx of prior CVA as well along with afib, pacemaker, HTN, bakers cysts with bleeding, allergic rhinitis, back pain, CAD, anemia, thrombocytopenia, and OP  The nurse reports that Ms. Spilde was lethargic and not eating. The oncall provider was notified. Orders for a CBC, CMP, and UA were ordered.  The nurse reports  she ate 25-50% of breakfast and 240cc of fluid. She was more alert enough to swallow the meal but then drifted back to sleep. She is sleeping more with less verbalization. No fever, and stable vitals.   CBC 11/30/23 showed Hgb 10.9 Plt 56K WBC 12 MCV 87.6  CMP glucose 86 BUN 46.9 Cr 0.93 Na 144 K 4.6 ALT 22 AST 23 Alk phos 147  UA Leukocyte esterase 250 Nitrite 2+ protein 1+ negative for ketones, WBC 11-20 Bacteria 3+ RBC 0-4    Past Medical History:  Diagnosis Date   Actinic keratosis 05/25/2014   Anxiety    Atrial fibrillation (HCC) 09/21/2010   Basal cell carcinoma 05/25/2014   Multiple removed by Dr. Arminda Resides in March 2015: right neck, left neck, scalp    Bell's palsy 07/18/1979   Cervicalgia 01/31/2012   Closed fracture of lumbar vertebra without mention of spinal cord injury 07/17/2005   Conjunctiva disorder 12/26/2010   Coronary atherosclerosis of native coronary artery 07/17/1997   Cramp of limb 08/16/2009   Degeneration of lumbar or lumbosacral intervertebral disc 01/31/2012   Disturbance of skin sensation 08/2009   Dizziness and giddiness 11/08/2011   vertigo   External hemorrhoids without mention of complication 06/27/2010   External hemorrhoids without mention of complication 08/26/2012   Ganglion of tendon sheath 08/16/2009   Leg cramp 10/27/2013   Most frequently the right leg.    Long term (current) use of anticoagulants 09/2010   Lumbago 07/2009   Meralgia paresthetica 07/2007   MI, old    Myalgia and myositis, unspecified 01/17/2012   Osteoporosis    Other abnormal blood chemistry 04/08/2012  Other abnormal blood chemistry 2013   hyperglycemia   Other disorder of muscle, ligament, and fascia 04/08/2012   Other specified cardiac dysrhythmias(427.89) 06/27/2010   Pacemaker 05/06/2020   Pain in joint, ankle and foot 10/27/2013   Bilateral since 1998    Pain in joint, shoulder region 08/12/2012   Pain in joint, upper arm 12/26/2010   Pain in limb  01/11/2011   Pain in thoracic spine 01/31/2012   Pathologic fracture of vertebrae 01/22/2012   PVC's (premature ventricular contractions)    Rash and other nonspecific skin eruption 08/02/2011   Senile osteoporosis 07/17/1993   Stroke (HCC)    Unspecified essential hypertension 07/17/1997   Varicose veins of lower extremities 08/16/2009   Varicose veins of lower extremities 08/16/2009   Past Surgical History:  Procedure Laterality Date   COLONOSCOPY WITH PROPOFOL N/A 10/19/2015   Procedure: COLONOSCOPY WITH PROPOFOL;  Surgeon: Iva Boop, MD;  Location: [facility];  Service: Endoscopy;  Laterality: N/A;   CORONARY ARTERY BYPASS GRAFT  1998   x2; LIMA to LAD; SVG to diagonal off bypass   HEMORRHOID SURGERY  08/26/2012   Dr. Luisa Hart   LOOP RECORDER INSERTION N/A 05/13/2018   Procedure: LOOP RECORDER INSERTION;  Surgeon: Marinus Maw, MD;  Location: [facility] INVASIVE CV LAB;  Service: Cardiovascular;  Laterality: N/A;   LOOP RECORDER REMOVAL N/A 05/26/2019   Procedure: LOOP RECORDER REMOVAL;  Surgeon: Marinus Maw, MD;  Location: [facility] INVASIVE CV LAB;  Service: Cardiovascular;  Laterality: N/A;   MASS EXCISION Left 11/23/2015   Procedure: LEFT WRIST EXCISION CYST;  Surgeon: Betha Loa, MD;  Location: [facility];  Service: Orthopedics;  Laterality: Left;   PACEMAKER IMPLANT N/A 05/26/2019   Procedure: PACEMAKER IMPLANT;  Surgeon: Marinus Maw, MD;  Location: [facility] INVASIVE CV LAB;  Service: Cardiovascular;  Laterality: N/A;   SKIN BIOPSY  01/29/14   (R) neck; (R) scalp, 2 (L) neck; shave biopsy Superficial basal cell carcinoma Dr. Arminda Resides   TONSILLECTOMY  [phone]    Allergies  Allergen Reactions   Iodinated Contrast Media Nausea Only and Other (See Comments)    Severe nausea and also passed out   Iodine Nausea Only and Other (See Comments)    "Allergic," per MAR- PASSED OUT   Lodine [Etodolac] Other (See Comments)    "Allergic," per [facility]   Other Other (See  Comments)    Seasonal Allergies   Bactrim Rash   Relafen [Nabumetone] Rash   Sulfamethoxazole-Trimethoprim Rash    In matrix   Sulfanilamide Rash    Outpatient Encounter Medications as of 11/30/2023  Medication Sig   acetaminophen (TYLENOL) 325 MG tablet Take 650 mg by mouth 3 (three) times daily as needed (for pain).   Amino Acids-Protein Hydrolys (PRO-STAT) LIQD Take 30 mLs by mouth every morning.   amLODipine (NORVASC) 5 MG tablet Take 7.5 mg by mouth daily.   Artificial Saliva (BIOTENE DRY MOUTH MOISTURIZING) SOLN Use as directed 1-2 sprays in the mouth or throat 3 (three) times daily.   aspirin 81 MG chewable tablet Chew 1 tablet (81 mg total) by mouth daily.   atenolol (TENORMIN) 50 MG tablet Take 1 tablet (50 mg total) by mouth 2 (two) times daily.   Calcium Carb-Cholecalciferol (CALCIUM 500 + D3 PO) Take 1 capsule by mouth in the morning.   chlorhexidine (PERIDEX) 0.12 % solution Use as directed 15 mLs in the mouth or throat 2 (two) times daily.   diclofenac Sodium (VOLTAREN) 1 % GEL  Apply 4 g topically as needed.   digoxin (LANOXIN) 0.125 MG tablet Take 1 tablet (0.125 mg total) by mouth every other day.   docusate (COLACE) 50 MG/5ML liquid Take 100 mg by mouth daily.   hydrALAZINE (APRESOLINE) 10 MG tablet Take 10 mg by mouth as needed (SBP>180).   ipratropium (ATROVENT HFA) 17 MCG/ACT inhaler Inhale 2 puffs into the lungs every morning.   levocetirizine (XYZAL) 5 MG tablet Take 5 mg by mouth at bedtime.   Multiple Vitamins-Minerals (MULTIVITAMIN WITH IRON-MINERALS) liquid Take 15 mLs by mouth daily.   polyethylene glycol (MIRALAX / GLYCOLAX) 17 g packet Take 17 g by mouth See admin instructions. Mix 17 grams of powder into 4-6 oz of beverage of choice and drink once a day   promethazine (PHENERGAN) 12.5 MG suppository Place 12.5 mg rectally every 6 (six) hours as needed for nausea or vomiting. 12.5 mg, rectal, Every 6 Hours - PRN, Promethazine suppository 12.5 mg every 6 hours as  needed if vomiting or cannot take PO.   sodium chloride (OCEAN) 0.65 % nasal spray Place 1 spray into the nose 2 (two) times daily as needed for congestion.   No facility-administered encounter medications on file as of 11/30/2023.    Review of Systems  Unable to perform ROS: Patient nonverbal    Immunization History  Administered Date(s) Administered   Influenza Whole 08/06/2012   Influenza, High Dose Seasonal PF 08/15/2019, 08/27/2020   Influenza,inj,Quad PF,6+ Mos 08/30/2018   Influenza-Unspecified 08/06/2013, 08/24/2014, 08/26/2015, 08/31/2016, 08/27/2017, 08/27/2020   Moderna Sars-Covid-2 Vaccination 11/16/2019, 12/16/2019   Pneumococcal Conjugate-13 09/22/2015   Pneumococcal Polysaccharide-23 08/29/2006   Td 10/07/2007   Zoster Recombinant(Shingrix) 02/12/2018, 05/13/2018   Zoster, Live 03/12/2008   Pertinent  Health Maintenance Due  Topic Date Due   INFLUENZA VACCINE  02/04/2024 (Originally 06/07/2023)   DEXA SCAN  Completed      10/02/2022    8:00 AM 10/03/2022   10:49 AM 10/04/2022    9:50 AM 10/06/2022   10:31 AM 12/01/2022    9:28 AM  Fall Risk  Falls in the past year?  1 1 1 1   Was there an injury with Fall?  1 1 1 1   Fall Risk Category Calculator  3 3 2 2   Fall Risk Category (Retired)  High High Moderate   (RETIRED) Patient Fall Risk Level High fall risk High fall risk High fall risk High fall risk   Patient at Risk for Falls Due to  History of fall(s) History of fall(s) History of fall(s) History of fall(s)  Fall risk Follow up  Falls evaluation completed Falls evaluation completed Falls evaluation completed Falls evaluation completed   Functional Status Survey:    Vitals:   12/02/23 0820  BP: 122/78  Pulse: (!) 54  Resp: 17  Temp: (!) 97.1 F (36.2 C)  SpO2: 93%  Weight: 135 lb 9.6 oz (61.5 kg)   Body mass index is 21.89 kg/m. Physical Exam Vitals and nursing note reviewed.  Constitutional:      General: She is not in acute distress.     Appearance: She is not diaphoretic.     Comments: Sleepy but arouses to verbal stimilus  HENT:     Head: Normocephalic and atraumatic.     Mouth/Throat:     Mouth: Mucous membranes are moist.     Pharynx: Oropharynx is clear.  Eyes:     Conjunctiva/sclera: Conjunctivae normal.     Pupils: Pupils are equal, round, and reactive to light.  Neck:     Vascular: No JVD.  Cardiovascular:     Rate and Rhythm: Normal rate. Rhythm irregular.     Heart sounds: No murmur heard. Pulmonary:     Effort: Pulmonary effort is normal. No respiratory distress.     Breath sounds: Normal breath sounds. No wheezing.  Abdominal:     General: Bowel sounds are normal. There is no distension.     Palpations: Abdomen is soft.     Tenderness: There is no abdominal tenderness. There is no right CVA tenderness or left CVA tenderness.  Skin:    General: Skin is warm and dry.     Findings: Bruising present.  Neurological:     Comments: Left sided weakness, not able to f/c.       Labs reviewed: Recent Labs    05/10/23 0000 05/14/23 0000 05/16/23 0000 05/18/23 0000  NA 150* 150* 147  --   K 4.0 4.6 4.5  --   CL 116* 114* 111*  --   CO2 26* 23* 18  --   BUN 55* 43* 44*  --   CREATININE 1.3* 1.2* 1.2*  --   CALCIUM 8.5* 8.6* 9.0 8.6*   Recent Labs    01/02/23 0000 02/02/23 0000 05/07/23 0000  AST 21 21 24   ALT 21 17 16   ALKPHOS 80 83 57  ALBUMIN 3.1* 3.4* 3.3*   Recent Labs    02/02/23 0000 02/21/23 0000 05/07/23 0000 05/21/23 0000  WBC 9.1 9.3 12.4 14.4  NEUTROABS 5.70 5.00 7.90  --   HGB 9.2* 10.5* 10.6* 8.9*  HCT 30* 32* 32* 29*  PLT 182 163 148* 124*   Lab Results  Component Value Date   TSH 2.87 02/03/2022   Lab Results  Component Value Date   HGBA1C 5.7 (H) 09/26/2022   Lab Results  Component Value Date   CHOL 161 09/27/2022   HDL 39 (L) 09/27/2022   LDLCALC 114 (H) 09/27/2022   LDLDIRECT 81.3 08/19/2020   TRIG 39 09/27/2022   CHOLHDL 4.1 09/27/2022     Significant Diagnostic Results in last 30 days:  CUP PACEART REMOTE DEVICE CHECK Result Date: 11/30/2023 Scheduled remote reviewed. Normal device function.  Increase in RV pacing from about 20% to about 50-60% noted on trend, overall RV paced 13% of the time since 09/27/22. Next remote 91 days. KS, CVRS   Assessment/Plan  1. Lethargy (Primary) Likely due to underlying cognitive impairment and UTI Vitals are stable Staff will continue to attempt to encourage oral intake when she is alert enough to safely swallow  2. Acute cystitis without hematuria  - cephALEXin (KEFLEX) 500 MG capsule; Take 1 capsule (500 mg total) by mouth 3 (three) times daily for 7 days.  Follow culture.    3. Thrombocytopenia Plt 56K Trending down On baby asa Will need repeat CBC   Labs/tests ordered: CBC 1/27

## 2023-12-02 NOTE — Addendum Note (Signed)
Addended by: Esmond Camper on: 12/02/2023 08:40 AM   Modules accepted: Orders

## 2023-12-03 ENCOUNTER — Non-Acute Institutional Stay (SKILLED_NURSING_FACILITY): Payer: Self-pay | Admitting: Internal Medicine

## 2023-12-03 ENCOUNTER — Encounter: Payer: Self-pay | Admitting: Internal Medicine

## 2023-12-03 ENCOUNTER — Telehealth: Payer: Self-pay | Admitting: *Deleted

## 2023-12-03 DIAGNOSIS — R1312 Dysphagia, oropharyngeal phase: Secondary | ICD-10-CM

## 2023-12-03 DIAGNOSIS — J189 Pneumonia, unspecified organism: Secondary | ICD-10-CM

## 2023-12-03 DIAGNOSIS — N390 Urinary tract infection, site not specified: Secondary | ICD-10-CM | POA: Diagnosis not present

## 2023-12-03 DIAGNOSIS — R5383 Other fatigue: Secondary | ICD-10-CM

## 2023-12-03 DIAGNOSIS — R001 Bradycardia, unspecified: Secondary | ICD-10-CM | POA: Diagnosis not present

## 2023-12-03 DIAGNOSIS — I4821 Permanent atrial fibrillation: Secondary | ICD-10-CM | POA: Diagnosis not present

## 2023-12-03 DIAGNOSIS — N3000 Acute cystitis without hematuria: Secondary | ICD-10-CM | POA: Diagnosis not present

## 2023-12-03 DIAGNOSIS — I10 Essential (primary) hypertension: Secondary | ICD-10-CM

## 2023-12-03 DIAGNOSIS — I639 Cerebral infarction, unspecified: Secondary | ICD-10-CM | POA: Diagnosis not present

## 2023-12-03 DIAGNOSIS — D696 Thrombocytopenia, unspecified: Secondary | ICD-10-CM | POA: Diagnosis not present

## 2023-12-03 NOTE — Telephone Encounter (Signed)
Susan Conway, daughter called wanting to know what time you were going to see patient today because she wanted to be there.   Would like someone to call with the time #[phone]

## 2023-12-03 NOTE — Telephone Encounter (Signed)
Talked to the patients Daughter

## 2023-12-03 NOTE — Progress Notes (Signed)
Location: Medical illustrator of Service:  SNF (31)  Provider:   Code Status: DNR Goals of Care:     10/01/2023    4:22 PM  Advanced Directives  Does Patient Have a Medical Advance Directive? Yes  Type of Estate agent of [place];Out of facility DNR (pink MOST or yellow form)  Does patient want to make changes to medical advance directive? No - Patient declined  Copy of Healthcare Power of Attorney in Chart? Yes - validated most recent copy scanned in chart (See row information)     Chief Complaint  Patient presents with   Acute Visit    HPI: Patient is a 89 y.o. female seen today for an acute visit for Lethargy and change in mental status  Lives in SNF in [place]  Patient with h/o PAF with Previous strokes but off DOAC due to Bleeding She also has h/o Hypernatremia She has h/o  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, likely embolic given afib not on [facility] with Left Hemiplegia and Neglect, Dysphagia,in 11/23   Patient also has a history of CAD, thrombocytopenia   history of GI bleed on Coumadin and  history of ruptured Baker's cyst with hemorrhage on Eliquis  S/P PPM  Over the past few days patient has been noticed to have poor appetite and pocketing food.  She had  blood work done on Thursday which showed leukocytosis and low platelets.  BUN slightly high but Sodium was in Normal Limit The urine UA showed positive for esterase and nitrites.  She was started on Keflex  But over the weekend patient became more lethargic.  Her daughter wanted her to have some IV fluids.  She does not want patient to go to the hospital.  Nurses were unable to get the IV access    I DC'd her Keflex and started her on Rocephin as patient continues to have poor fluid p.o. intake and pocketing her medicine. There is also some concern that patient has a tooth abscess  .  Her chest x-ray which shows left lower lobe infiltrate possibly aspiration.  Patient  today continues to be sleepy.  Per daughter and the nurses she was able to take some p.o. fluids She opens her Eyes  but then falls back to sleep.  Is not following any commands for me. Past Medical History:  Diagnosis Date   Actinic keratosis 05/25/2014   Anxiety    Atrial fibrillation (HCC) 09/21/2010   Basal cell carcinoma 05/25/2014   Multiple removed by Dr. Arminda Resides in March 2015: right neck, left neck, scalp    Bell's palsy 07/18/1979   Cervicalgia 01/31/2012   Closed fracture of lumbar vertebra without mention of spinal cord injury 07/17/2005   Conjunctiva disorder 12/26/2010   Coronary atherosclerosis of native coronary artery 07/17/1997   Cramp of limb 08/16/2009   Degeneration of lumbar or lumbosacral intervertebral disc 01/31/2012   Disturbance of skin sensation 08/2009   Dizziness and giddiness 11/08/2011   vertigo   External hemorrhoids without mention of complication 06/27/2010   External hemorrhoids without mention of complication 08/26/2012   Ganglion of tendon sheath 08/16/2009   Leg cramp 10/27/2013   Most frequently the right leg.    Long term (current) use of anticoagulants 09/2010   Lumbago 07/2009   Meralgia paresthetica 07/2007   MI, old    Myalgia and myositis, unspecified 01/17/2012   Osteoporosis    Other abnormal blood chemistry 04/08/2012   Other abnormal  blood chemistry 2013   hyperglycemia   Other disorder of muscle, ligament, and fascia 04/08/2012   Other specified cardiac dysrhythmias(427.89) 06/27/2010   Pacemaker 05/06/2020   Pain in joint, ankle and foot 10/27/2013   Bilateral since 1998    Pain in joint, shoulder region 08/12/2012   Pain in joint, upper arm 12/26/2010   Pain in limb 01/11/2011   Pain in thoracic spine 01/31/2012   Pathologic fracture of vertebrae 01/22/2012   PVC's (premature ventricular contractions)    Rash and other nonspecific skin eruption 08/02/2011   Senile osteoporosis 07/17/1993   Stroke (HCC)     Unspecified essential hypertension 07/17/1997   Varicose veins of lower extremities 08/16/2009   Varicose veins of lower extremities 08/16/2009    Past Surgical History:  Procedure Laterality Date   COLONOSCOPY WITH PROPOFOL N/A 10/19/2015   Procedure: COLONOSCOPY WITH PROPOFOL;  Surgeon: Iva Boop, MD;  Location: [facility] ENDOSCOPY;  Service: Endoscopy;  Laterality: N/A;   CORONARY ARTERY BYPASS GRAFT  1998   x2; LIMA to LAD; SVG to diagonal off bypass   HEMORRHOID SURGERY  08/26/2012   Dr. Luisa Hart   LOOP RECORDER INSERTION N/A 05/13/2018   Procedure: LOOP RECORDER INSERTION;  Surgeon: Marinus Maw, MD;  Location: [facility] INVASIVE CV LAB;  Service: Cardiovascular;  Laterality: N/A;   LOOP RECORDER REMOVAL N/A 05/26/2019   Procedure: LOOP RECORDER REMOVAL;  Surgeon: Marinus Maw, MD;  Location: [facility] INVASIVE CV LAB;  Service: Cardiovascular;  Laterality: N/A;   MASS EXCISION Left 11/23/2015   Procedure: LEFT WRIST EXCISION CYST;  Surgeon: Betha Loa, MD;  Location:  SURGERY CENTER;  Service: Orthopedics;  Laterality: Left;   PACEMAKER IMPLANT N/A 05/26/2019   Procedure: PACEMAKER IMPLANT;  Surgeon: Marinus Maw, MD;  Location: [facility] INVASIVE CV LAB;  Service: Cardiovascular;  Laterality: N/A;   SKIN BIOPSY  01/29/14   (R) neck; (R) scalp, 2 (L) neck; shave biopsy Superficial basal cell carcinoma Dr. Arminda Resides   TONSILLECTOMY  [phone]    Allergies  Allergen Reactions   Iodinated Contrast Media Nausea Only and Other (See Comments)    Severe nausea and also passed out   Iodine Nausea Only and Other (See Comments)    "Allergic," per [facility]- PASSED OUT   Lodine [Etodolac] Other (See Comments)    "Allergic," per [facility]   Other Other (See Comments)    Seasonal Allergies   Bactrim Rash   Relafen [Nabumetone] Rash   Sulfamethoxazole-Trimethoprim Rash    In matrix   Sulfanilamide Rash    Outpatient Encounter Medications as of 12/03/2023  Medication Sig   cefTRIAXone (ROCEPHIN) 2 g  injection Inject 2 g into the muscle daily.   acetaminophen (TYLENOL) 325 MG tablet Take 650 mg by mouth 3 (three) times daily as needed (for pain).   amLODipine (NORVASC) 5 MG tablet Take 7.5 mg by mouth daily.   Artificial Saliva (BIOTENE DRY MOUTH MOISTURIZING) SOLN Use as directed 1-2 sprays in the mouth or throat 3 (three) times daily.   aspirin 81 MG chewable tablet Chew 1 tablet (81 mg total) by mouth daily.   atenolol (TENORMIN) 50 MG tablet Take 1 tablet (50 mg total) by mouth 2 (two) times daily.   Calcium Carb-Cholecalciferol (CALCIUM 500 + D3 PO) Take 1 capsule by mouth in the morning.   chlorhexidine (PERIDEX) 0.12 % solution Use as directed 15 mLs in the mouth or throat 2 (two) times daily.   diclofenac Sodium (VOLTAREN) 1 % GEL Apply  4 g topically as needed.   digoxin (LANOXIN) 0.125 MG tablet Take 1 tablet (0.125 mg total) by mouth every other day.   docusate (COLACE) 50 MG/5ML liquid Take 100 mg by mouth daily.   hydrALAZINE (APRESOLINE) 10 MG tablet Take 10 mg by mouth as needed (SBP>180).   ipratropium (ATROVENT HFA) 17 MCG/ACT inhaler Inhale 2 puffs into the lungs every morning.   levocetirizine (XYZAL) 5 MG tablet Take 5 mg by mouth at bedtime.   Multiple Vitamins-Minerals (MULTIVITAMIN WITH IRON-MINERALS) liquid Take 15 mLs by mouth daily.   polyethylene glycol (MIRALAX / GLYCOLAX) 17 g packet Take 17 g by mouth See admin instructions. Mix 17 grams of powder into 4-6 oz of beverage of choice and drink once a day   promethazine (PHENERGAN) 12.5 MG suppository Place 12.5 mg rectally every 6 (six) hours as needed for nausea or vomiting. 12.5 mg, rectal, Every 6 Hours - PRN, Promethazine suppository 12.5 mg every 6 hours as needed if vomiting or cannot take PO.   sodium chloride (OCEAN) 0.65 % nasal spray Place 1 spray into the nose 2 (two) times daily as needed for congestion.   [DISCONTINUED] cephALEXin (KEFLEX) 500 MG capsule Take 1 capsule (500 mg total) by mouth 3 (three)  times daily for 7 days.   No facility-administered encounter medications on file as of 12/03/2023.    Review of Systems:  Review of Systems  Unable to perform ROS: Other    Health Maintenance  Topic Date Due   DTaP/Tdap/Td (2 - Tdap) 10/06/2017   COVID-19 Vaccine (3 - Moderna risk series) 01/13/2020   Medicare Annual Wellness (AWV)  09/27/2022   INFLUENZA VACCINE  02/04/2024 (Originally 06/07/2023)   Pneumonia Vaccine 40+ Years old  Completed   DEXA SCAN  Completed   Zoster Vaccines- Shingrix  Completed   HPV VACCINES  Aged Out    Physical Exam: Vitals:   12/03/23 1433  BP: (!) 152/90  Pulse: (!) 52  Resp: 18  Temp: (!) 96.1 F (35.6 C)  Weight: 135 lb 12.8 oz (61.6 kg)   Body mass index is 21.92 kg/m. Physical Exam Vitals reviewed.  Constitutional:      Appearance: Normal appearance.  HENT:     Head: Normocephalic.     Nose: Nose normal.     Mouth/Throat:     Mouth: Mucous membranes are moist.     Pharynx: Oropharynx is clear.     Comments: Possible Abscess in her Left Back Tooth Difficult to eval due to her Lethargy Eyes:     Pupils: Pupils are equal, round, and reactive to light.  Cardiovascular:     Rate and Rhythm: Normal rate and regular rhythm.     Pulses: Normal pulses.     Heart sounds: Normal heart sounds. No murmur heard. Pulmonary:     Effort: Pulmonary effort is normal.     Breath sounds: Normal breath sounds.  Abdominal:     General: Abdomen is flat. Bowel sounds are normal.     Palpations: Abdomen is soft.  Musculoskeletal:        General: No swelling.     Cervical back: Neck supple.  Skin:    General: Skin is warm.  Neurological:     Mental Status: She is alert.     Comments: Patient has Left Hemiparesis and Neglect Also continues to be lethargic Does not follow commands    Psychiatric:        Mood and Affect: Mood normal.  Thought Content: Thought content normal.     Labs reviewed: Basic Metabolic Panel: Recent Labs     05/10/23 0000 05/14/23 0000 05/16/23 0000 05/18/23 0000  NA 150* 150* 147  --   K 4.0 4.6 4.5  --   CL 116* 114* 111*  --   CO2 26* 23* 18  --   BUN 55* 43* 44*  --   CREATININE 1.3* 1.2* 1.2*  --   CALCIUM 8.5* 8.6* 9.0 8.6*   Liver Function Tests: Recent Labs    01/02/23 0000 02/02/23 0000 05/07/23 0000  AST 21 21 24   ALT 21 17 16   ALKPHOS 80 83 57  ALBUMIN 3.1* 3.4* 3.3*   No results for input(s): "LIPASE", "AMYLASE" in the last 8760 hours. No results for input(s): "AMMONIA" in the last 8760 hours. CBC: Recent Labs    02/02/23 0000 02/21/23 0000 05/07/23 0000 05/21/23 0000  WBC 9.1 9.3 12.4 14.4  NEUTROABS 5.70 5.00 7.90  --   HGB 9.2* 10.5* 10.6* 8.9*  HCT 30* 32* 32* 29*  PLT 182 163 148* 124*   Lipid Panel: No results for input(s): "CHOL", "HDL", "LDLCALC", "TRIG", "CHOLHDL", "LDLDIRECT" in the last 8760 hours. Lab Results  Component Value Date   HGBA1C 5.7 (H) 09/26/2022    Procedures since last visit: CUP PACEART REMOTE DEVICE CHECK Result Date: 11/30/2023 Scheduled remote reviewed. Normal device function.  Increase in RV pacing from about 20% to about 50-60% noted on trend, overall RV paced 13% of the time since 09/27/22. Next remote 91 days. KS, CVRS   Assessment/Plan 1. Lethargy (Primary) Combination of Infection and Possible New stroke Patient has not made much improvement with Rocephin Repeat Labs are back from today WBC is 5.2 today down from 12 Platelet are still 50 K Bun Slightly high at 50 Creat 1.07 Discussed in detail with the daughter. This can be end-of-life issue for patient.  If she does not make any improvement the next few days the daughter wants to discuss again the goals of care  2. Acute cystitis without hematuria On Rocephin Urine culture more then 100 K of Lactose fermenting bacteria Sensitive to Rocephin 3 Left Lower Lobe Infiltrate This can be due to Silent aspiration She has no fever or SOB right now  4.  Oropharyngeal dysphagia She is on Honey thick Puree I talked to ST and they are going to try some session but they are not hopeful she will be able to work with them  5  Cerebrovascular accident (CVA), unspecified mechanism (HCC) Recurent as she is not on Eliquis with h/o A Fib as she has h/o Bleeding on DOAC On ASpirin  6. Permanent atrial fibrillation (HCC) with Bradycardia On Atenolol Her heart rate has been more in the 50s and nurses are holding the atenolol I have changed her morning dose to 37.5 mg continue 50 mg in the evening   7. Essential hypertension On Amlodipine and atenelol If the blood pressure goes up can consider going up on amlodipine  8  Possible Tooth Abscess She Is on Rocephin We are talking to her dentis who is [facility] to see if she needs anything to be done I do not  think that is  causing her issues   Labs/tests ordered:  * No order type specified * Next appt:  Visit date not found

## 2023-12-05 DIAGNOSIS — I69354 Hemiplegia and hemiparesis following cerebral infarction affecting left non-dominant side: Secondary | ICD-10-CM | POA: Diagnosis not present

## 2023-12-05 DIAGNOSIS — I69322 Dysarthria following cerebral infarction: Secondary | ICD-10-CM | POA: Diagnosis not present

## 2023-12-05 DIAGNOSIS — R1312 Dysphagia, oropharyngeal phase: Secondary | ICD-10-CM | POA: Diagnosis not present

## 2023-12-06 DIAGNOSIS — I679 Cerebrovascular disease, unspecified: Secondary | ICD-10-CM | POA: Diagnosis not present

## 2023-12-06 DIAGNOSIS — I440 Atrioventricular block, first degree: Secondary | ICD-10-CM | POA: Diagnosis not present

## 2023-12-06 DIAGNOSIS — N1831 Chronic kidney disease, stage 3a: Secondary | ICD-10-CM | POA: Diagnosis not present

## 2023-12-06 DIAGNOSIS — I2510 Atherosclerotic heart disease of native coronary artery without angina pectoris: Secondary | ICD-10-CM | POA: Diagnosis not present

## 2023-12-06 DIAGNOSIS — G8190 Hemiplegia, unspecified affecting unspecified side: Secondary | ICD-10-CM | POA: Diagnosis not present

## 2023-12-06 DIAGNOSIS — R001 Bradycardia, unspecified: Secondary | ICD-10-CM | POA: Diagnosis not present

## 2023-12-06 DIAGNOSIS — I10 Essential (primary) hypertension: Secondary | ICD-10-CM | POA: Diagnosis not present

## 2023-12-06 DIAGNOSIS — I700 Atherosclerosis of aorta: Secondary | ICD-10-CM | POA: Diagnosis not present

## 2023-12-06 DIAGNOSIS — I739 Peripheral vascular disease, unspecified: Secondary | ICD-10-CM | POA: Diagnosis not present

## 2023-12-06 DIAGNOSIS — Z950 Presence of cardiac pacemaker: Secondary | ICD-10-CM | POA: Diagnosis not present

## 2023-12-06 DIAGNOSIS — K219 Gastro-esophageal reflux disease without esophagitis: Secondary | ICD-10-CM | POA: Diagnosis not present

## 2023-12-06 DIAGNOSIS — M1990 Unspecified osteoarthritis, unspecified site: Secondary | ICD-10-CM | POA: Diagnosis not present

## 2023-12-07 DIAGNOSIS — I440 Atrioventricular block, first degree: Secondary | ICD-10-CM | POA: Diagnosis not present

## 2023-12-07 DIAGNOSIS — Z950 Presence of cardiac pacemaker: Secondary | ICD-10-CM | POA: Diagnosis not present

## 2023-12-07 DIAGNOSIS — R001 Bradycardia, unspecified: Secondary | ICD-10-CM | POA: Diagnosis not present

## 2023-12-07 DIAGNOSIS — N1831 Chronic kidney disease, stage 3a: Secondary | ICD-10-CM | POA: Diagnosis not present

## 2023-12-07 DIAGNOSIS — I2510 Atherosclerotic heart disease of native coronary artery without angina pectoris: Secondary | ICD-10-CM | POA: Diagnosis not present

## 2023-12-07 DIAGNOSIS — I679 Cerebrovascular disease, unspecified: Secondary | ICD-10-CM | POA: Diagnosis not present

## 2023-12-08 DIAGNOSIS — N1831 Chronic kidney disease, stage 3a: Secondary | ICD-10-CM | POA: Diagnosis not present

## 2023-12-08 DIAGNOSIS — I440 Atrioventricular block, first degree: Secondary | ICD-10-CM | POA: Diagnosis not present

## 2023-12-08 DIAGNOSIS — Z950 Presence of cardiac pacemaker: Secondary | ICD-10-CM | POA: Diagnosis not present

## 2023-12-08 DIAGNOSIS — K219 Gastro-esophageal reflux disease without esophagitis: Secondary | ICD-10-CM | POA: Diagnosis not present

## 2023-12-08 DIAGNOSIS — M1990 Unspecified osteoarthritis, unspecified site: Secondary | ICD-10-CM | POA: Diagnosis not present

## 2023-12-08 DIAGNOSIS — G8190 Hemiplegia, unspecified affecting unspecified side: Secondary | ICD-10-CM | POA: Diagnosis not present

## 2023-12-08 DIAGNOSIS — R001 Bradycardia, unspecified: Secondary | ICD-10-CM | POA: Diagnosis not present

## 2023-12-08 DIAGNOSIS — I10 Essential (primary) hypertension: Secondary | ICD-10-CM | POA: Diagnosis not present

## 2023-12-08 DIAGNOSIS — I739 Peripheral vascular disease, unspecified: Secondary | ICD-10-CM | POA: Diagnosis not present

## 2023-12-08 DIAGNOSIS — I2510 Atherosclerotic heart disease of native coronary artery without angina pectoris: Secondary | ICD-10-CM | POA: Diagnosis not present

## 2023-12-08 DIAGNOSIS — I679 Cerebrovascular disease, unspecified: Secondary | ICD-10-CM | POA: Diagnosis not present

## 2023-12-08 DIAGNOSIS — I700 Atherosclerosis of aorta: Secondary | ICD-10-CM | POA: Diagnosis not present

## 2023-12-09 DIAGNOSIS — R001 Bradycardia, unspecified: Secondary | ICD-10-CM | POA: Diagnosis not present

## 2023-12-09 DIAGNOSIS — I679 Cerebrovascular disease, unspecified: Secondary | ICD-10-CM | POA: Diagnosis not present

## 2023-12-09 DIAGNOSIS — I2510 Atherosclerotic heart disease of native coronary artery without angina pectoris: Secondary | ICD-10-CM | POA: Diagnosis not present

## 2023-12-09 DIAGNOSIS — N1831 Chronic kidney disease, stage 3a: Secondary | ICD-10-CM | POA: Diagnosis not present

## 2023-12-09 DIAGNOSIS — I440 Atrioventricular block, first degree: Secondary | ICD-10-CM | POA: Diagnosis not present

## 2023-12-09 DIAGNOSIS — Z950 Presence of cardiac pacemaker: Secondary | ICD-10-CM | POA: Diagnosis not present

## 2023-12-10 DIAGNOSIS — Z950 Presence of cardiac pacemaker: Secondary | ICD-10-CM | POA: Diagnosis not present

## 2023-12-10 DIAGNOSIS — R001 Bradycardia, unspecified: Secondary | ICD-10-CM | POA: Diagnosis not present

## 2023-12-10 DIAGNOSIS — I2510 Atherosclerotic heart disease of native coronary artery without angina pectoris: Secondary | ICD-10-CM | POA: Diagnosis not present

## 2023-12-10 DIAGNOSIS — I440 Atrioventricular block, first degree: Secondary | ICD-10-CM | POA: Diagnosis not present

## 2023-12-10 DIAGNOSIS — N1831 Chronic kidney disease, stage 3a: Secondary | ICD-10-CM | POA: Diagnosis not present

## 2023-12-10 DIAGNOSIS — I679 Cerebrovascular disease, unspecified: Secondary | ICD-10-CM | POA: Diagnosis not present

## 2023-12-11 DIAGNOSIS — I679 Cerebrovascular disease, unspecified: Secondary | ICD-10-CM | POA: Diagnosis not present

## 2023-12-11 DIAGNOSIS — N1831 Chronic kidney disease, stage 3a: Secondary | ICD-10-CM | POA: Diagnosis not present

## 2023-12-11 DIAGNOSIS — R001 Bradycardia, unspecified: Secondary | ICD-10-CM | POA: Diagnosis not present

## 2023-12-11 DIAGNOSIS — Z950 Presence of cardiac pacemaker: Secondary | ICD-10-CM | POA: Diagnosis not present

## 2023-12-11 DIAGNOSIS — I440 Atrioventricular block, first degree: Secondary | ICD-10-CM | POA: Diagnosis not present

## 2023-12-11 DIAGNOSIS — I2510 Atherosclerotic heart disease of native coronary artery without angina pectoris: Secondary | ICD-10-CM | POA: Diagnosis not present

## 2023-12-12 DIAGNOSIS — N1831 Chronic kidney disease, stage 3a: Secondary | ICD-10-CM | POA: Diagnosis not present

## 2023-12-12 DIAGNOSIS — R001 Bradycardia, unspecified: Secondary | ICD-10-CM | POA: Diagnosis not present

## 2023-12-12 DIAGNOSIS — I2510 Atherosclerotic heart disease of native coronary artery without angina pectoris: Secondary | ICD-10-CM | POA: Diagnosis not present

## 2023-12-12 DIAGNOSIS — I679 Cerebrovascular disease, unspecified: Secondary | ICD-10-CM | POA: Diagnosis not present

## 2023-12-12 DIAGNOSIS — I440 Atrioventricular block, first degree: Secondary | ICD-10-CM | POA: Diagnosis not present

## 2023-12-12 DIAGNOSIS — Z950 Presence of cardiac pacemaker: Secondary | ICD-10-CM | POA: Diagnosis not present

## 2023-12-13 DIAGNOSIS — N1831 Chronic kidney disease, stage 3a: Secondary | ICD-10-CM | POA: Diagnosis not present

## 2023-12-13 DIAGNOSIS — I679 Cerebrovascular disease, unspecified: Secondary | ICD-10-CM | POA: Diagnosis not present

## 2023-12-13 DIAGNOSIS — Z950 Presence of cardiac pacemaker: Secondary | ICD-10-CM | POA: Diagnosis not present

## 2023-12-13 DIAGNOSIS — R001 Bradycardia, unspecified: Secondary | ICD-10-CM | POA: Diagnosis not present

## 2023-12-13 DIAGNOSIS — I2510 Atherosclerotic heart disease of native coronary artery without angina pectoris: Secondary | ICD-10-CM | POA: Diagnosis not present

## 2023-12-13 DIAGNOSIS — I440 Atrioventricular block, first degree: Secondary | ICD-10-CM | POA: Diagnosis not present

## 2024-01-05 DEATH — deceased

## 2024-01-09 NOTE — Progress Notes (Signed)
 Remote pacemaker transmission.

## 2024-02-29 ENCOUNTER — Ambulatory Visit: Payer: Medicare Other

## 2024-05-30 ENCOUNTER — Ambulatory Visit: Payer: Medicare Other

## 2024-08-29 ENCOUNTER — Ambulatory Visit: Payer: Medicare Other
